# Patient Record
Sex: Female | Born: 1955 | Race: Black or African American | Hispanic: No | Smoking: Never smoker
Health system: Southern US, Community
[De-identification: ages and names within clinical notes are randomized; demographics above are authoritative.]

## PROBLEM LIST (undated history)

## (undated) DIAGNOSIS — E785 Hyperlipidemia, unspecified: Secondary | ICD-10-CM

## (undated) DIAGNOSIS — K254 Chronic or unspecified gastric ulcer with hemorrhage: Secondary | ICD-10-CM

## (undated) DIAGNOSIS — C220 Liver cell carcinoma: Secondary | ICD-10-CM

## (undated) DIAGNOSIS — I480 Paroxysmal atrial fibrillation: Secondary | ICD-10-CM

## (undated) DIAGNOSIS — K7030 Alcoholic cirrhosis of liver without ascites: Secondary | ICD-10-CM

## (undated) DIAGNOSIS — K921 Melena: Secondary | ICD-10-CM

## (undated) DIAGNOSIS — E119 Type 2 diabetes mellitus without complications: Secondary | ICD-10-CM

## (undated) DIAGNOSIS — I671 Cerebral aneurysm, nonruptured: Secondary | ICD-10-CM

## (undated) DIAGNOSIS — N189 Chronic kidney disease, unspecified: Secondary | ICD-10-CM

## (undated) DIAGNOSIS — I10 Essential (primary) hypertension: Secondary | ICD-10-CM

## (undated) DIAGNOSIS — G40909 Epilepsy, unspecified, not intractable, without status epilepticus: Secondary | ICD-10-CM

## (undated) HISTORY — DX: Chronic kidney disease, unspecified: N18.9

## (undated) HISTORY — PX: CEREBRAL ANEURYSM REPAIR: SHX164

## (undated) HISTORY — DX: Melena: K92.1

## (undated) HISTORY — DX: Hyperlipidemia, unspecified: E78.5

## (undated) HISTORY — DX: Essential (primary) hypertension: I10

## (undated) HISTORY — DX: Type 2 diabetes mellitus without complications: E11.9

## (undated) HISTORY — DX: Cerebral aneurysm, nonruptured: I67.1

## (undated) HISTORY — DX: Chronic or unspecified gastric ulcer with hemorrhage: K25.4

## (undated) HISTORY — DX: Epilepsy, unspecified, not intractable, without status epilepticus: G40.909

---

## 1989-04-19 HISTORY — PX: CEREBRAL ANEURYSM REPAIR: SHX164

## 2013-10-11 ENCOUNTER — Emergency Department: Payer: Self-pay | Admitting: Emergency Medicine

## 2013-10-11 DIAGNOSIS — W57XXXA Bitten or stung by nonvenomous insect and other nonvenomous arthropods, initial encounter: Secondary | ICD-10-CM | POA: Diagnosis not present

## 2013-10-11 DIAGNOSIS — E119 Type 2 diabetes mellitus without complications: Secondary | ICD-10-CM | POA: Diagnosis not present

## 2013-10-11 DIAGNOSIS — T148 Other injury of unspecified body region: Secondary | ICD-10-CM | POA: Diagnosis not present

## 2013-10-11 DIAGNOSIS — I10 Essential (primary) hypertension: Secondary | ICD-10-CM | POA: Diagnosis not present

## 2013-10-11 DIAGNOSIS — S30860A Insect bite (nonvenomous) of lower back and pelvis, initial encounter: Secondary | ICD-10-CM | POA: Diagnosis not present

## 2013-10-11 DIAGNOSIS — Z79899 Other long term (current) drug therapy: Secondary | ICD-10-CM | POA: Diagnosis not present

## 2013-10-11 LAB — CBC
HCT: 33.1 % — ABNORMAL LOW (ref 35.0–47.0)
HGB: 11.1 g/dL — ABNORMAL LOW (ref 12.0–16.0)
MCH: 33 pg (ref 26.0–34.0)
MCHC: 33.5 g/dL (ref 32.0–36.0)
MCV: 98 fL (ref 80–100)
Platelet: 100 10*3/uL — ABNORMAL LOW (ref 150–440)
RBC: 3.36 10*6/uL — AB (ref 3.80–5.20)
RDW: 13.7 % (ref 11.5–14.5)
WBC: 4.4 10*3/uL (ref 3.6–11.0)

## 2013-10-11 LAB — URINALYSIS, COMPLETE
Bilirubin,UR: NEGATIVE
Ketone: NEGATIVE
NITRITE: NEGATIVE
Ph: 6 (ref 4.5–8.0)
Protein: 100
RBC,UR: 17 /HPF (ref 0–5)
Specific Gravity: 1.012 (ref 1.003–1.030)
Squamous Epithelial: 1
WBC UR: 16 /HPF (ref 0–5)

## 2013-10-11 LAB — BASIC METABOLIC PANEL
Anion Gap: 8 (ref 7–16)
BUN: 13 mg/dL (ref 7–18)
CALCIUM: 8.4 mg/dL — AB (ref 8.5–10.1)
CHLORIDE: 106 mmol/L (ref 98–107)
CO2: 24 mmol/L (ref 21–32)
Creatinine: 0.87 mg/dL (ref 0.60–1.30)
EGFR (African American): >60
EGFR (Non-African Amer.): >60
GLUCOSE: 235 mg/dL — AB (ref 65–99)
Osmolality: 283 (ref 275–301)
POTASSIUM: 3.9 mmol/L (ref 3.5–5.1)
Sodium: 138 mmol/L (ref 136–145)

## 2014-01-08 DIAGNOSIS — E782 Mixed hyperlipidemia: Secondary | ICD-10-CM | POA: Diagnosis not present

## 2014-01-08 DIAGNOSIS — I10 Essential (primary) hypertension: Secondary | ICD-10-CM | POA: Diagnosis not present

## 2014-01-08 DIAGNOSIS — E119 Type 2 diabetes mellitus without complications: Secondary | ICD-10-CM | POA: Diagnosis not present

## 2014-01-23 DIAGNOSIS — H40003 Preglaucoma, unspecified, bilateral: Secondary | ICD-10-CM | POA: Diagnosis not present

## 2014-02-01 DIAGNOSIS — H40003 Preglaucoma, unspecified, bilateral: Secondary | ICD-10-CM | POA: Diagnosis not present

## 2014-04-22 DIAGNOSIS — B182 Chronic viral hepatitis C: Secondary | ICD-10-CM | POA: Diagnosis not present

## 2014-04-22 DIAGNOSIS — R945 Abnormal results of liver function studies: Secondary | ICD-10-CM | POA: Diagnosis not present

## 2014-05-02 DIAGNOSIS — N289 Disorder of kidney and ureter, unspecified: Secondary | ICD-10-CM | POA: Diagnosis not present

## 2014-05-15 ENCOUNTER — Ambulatory Visit: Payer: Self-pay | Admitting: Gastroenterology

## 2014-05-15 DIAGNOSIS — B182 Chronic viral hepatitis C: Secondary | ICD-10-CM | POA: Diagnosis not present

## 2014-05-15 DIAGNOSIS — K828 Other specified diseases of gallbladder: Secondary | ICD-10-CM | POA: Diagnosis not present

## 2014-05-15 IMAGING — US ABDOMEN ULTRASOUND LIMITED
1 series · 13 of 25 positions shown · non-contrast
Comparison: None

CLINICAL DATA: Hepatitis-C

EXAM:
US ABDOMEN LIMITED - RIGHT UPPER QUADRANT
ULTRASOUND HEPATIC ELASTOGRAPHY
TECHNIQUE: Limited right upper quadrant abdominal ultrasound was performed. In
addition, ultrasound elastography evaluation of the liver was
performed. A region of interest was placed in the right lobe of the
liver. Following application of a compressive sonographic pulse,
shear waves were detected in the adjacent hepatic tissue and the
shear wave velocity was calculated. Multiple assessments were
performed at the selected site. Median shear wave velocity is
correlated to a Metavir fibrosis score.

[Series 1: abdomen ultrasound limited · 0.22mm/px · 13 of 47 slices shown]
[im 1/47]
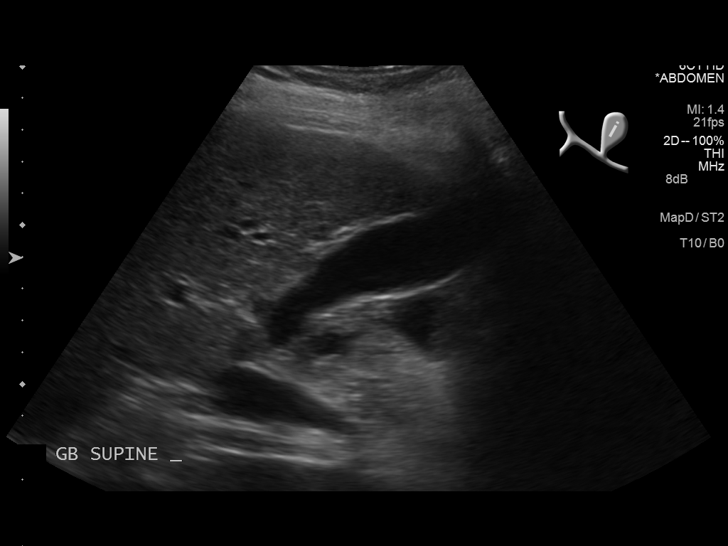
[im 4/47]
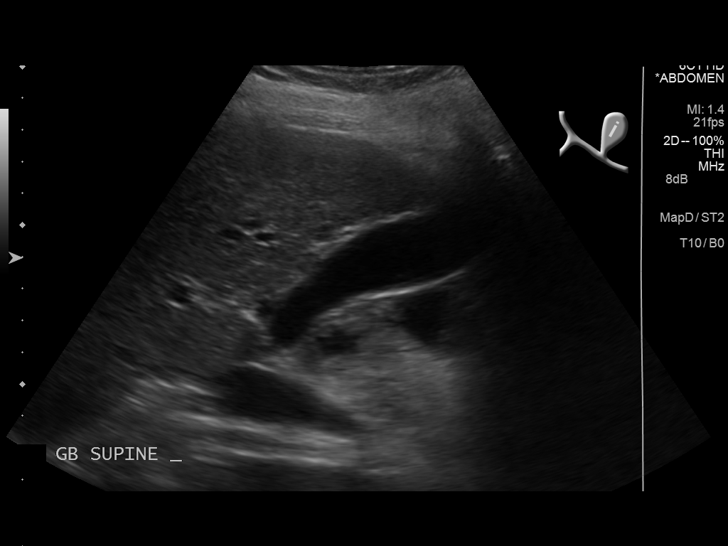
[im 8/47]
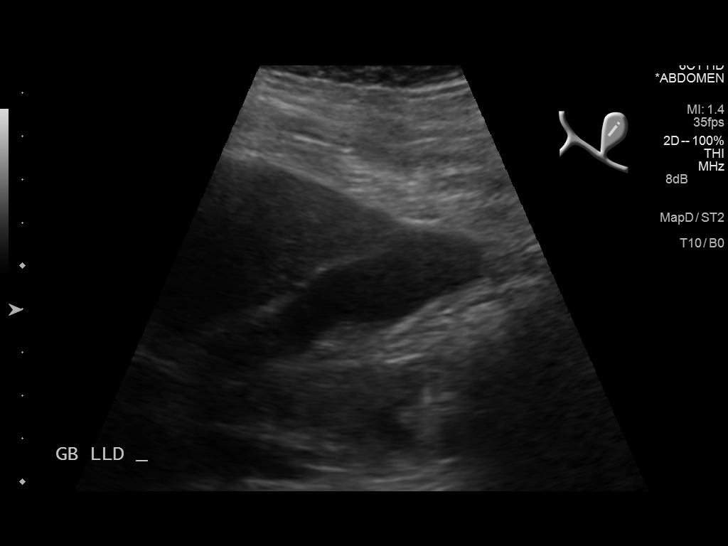
[im 12/47]
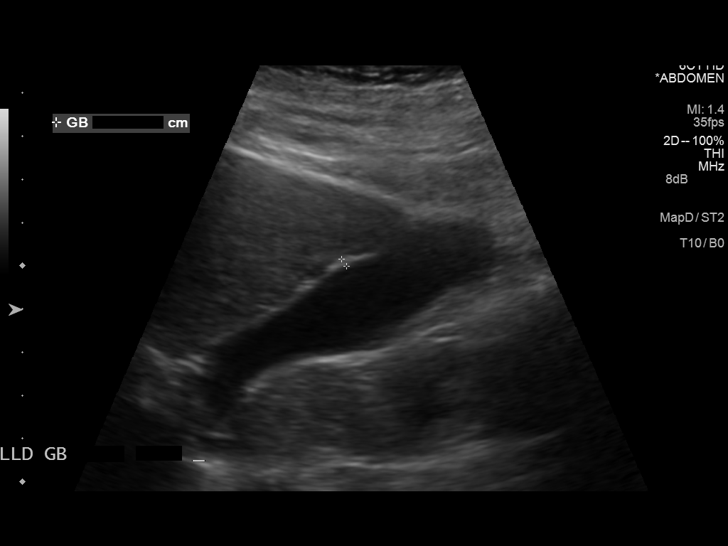
[im 16/47]
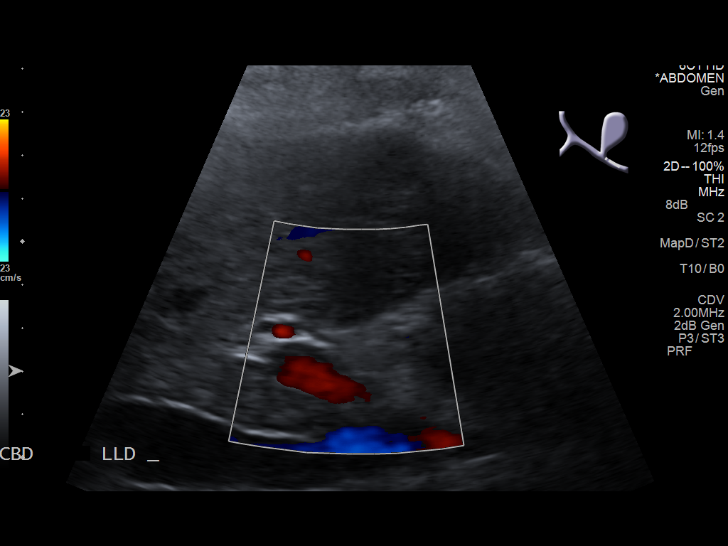
[im 20/47]
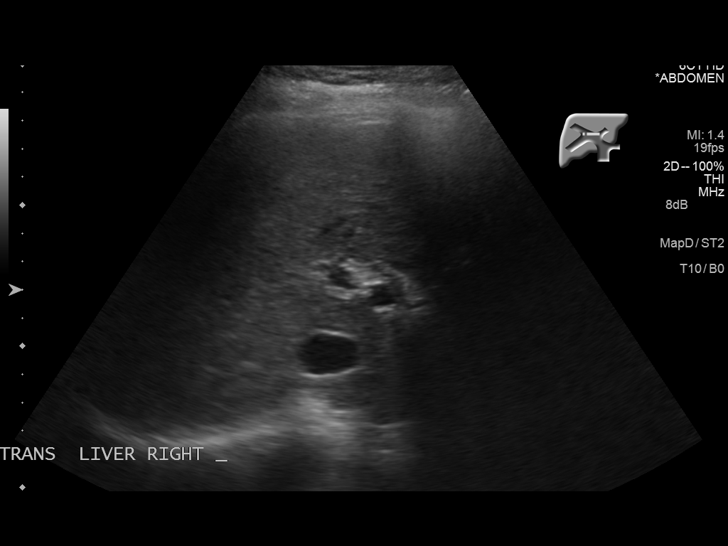
[im 24/47]
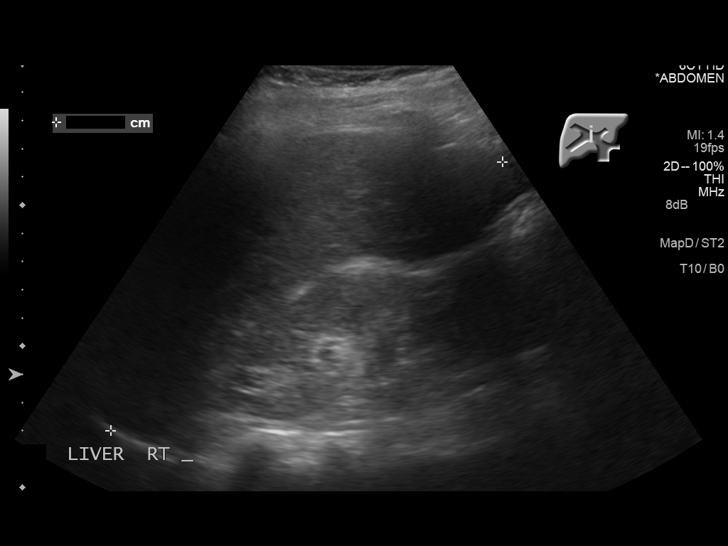
[im 27/47]
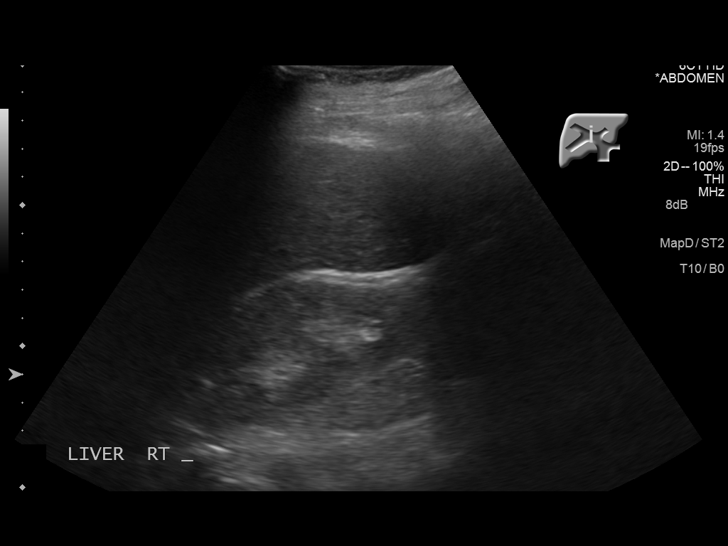
[im 31/47]
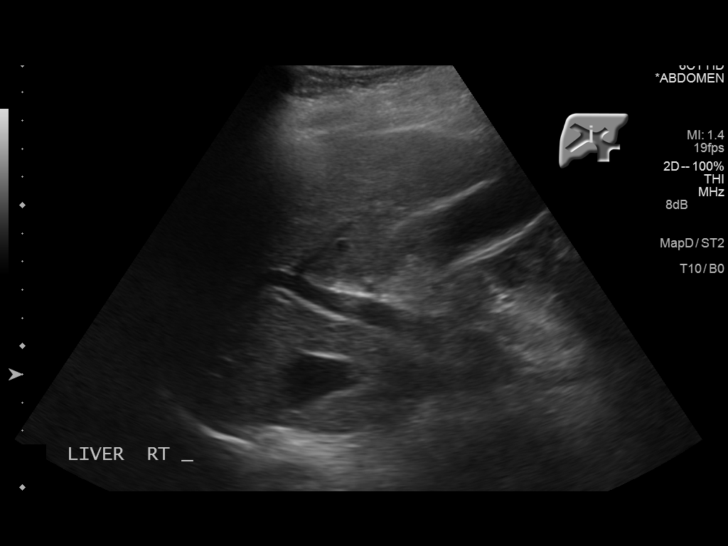
[im 35/47]
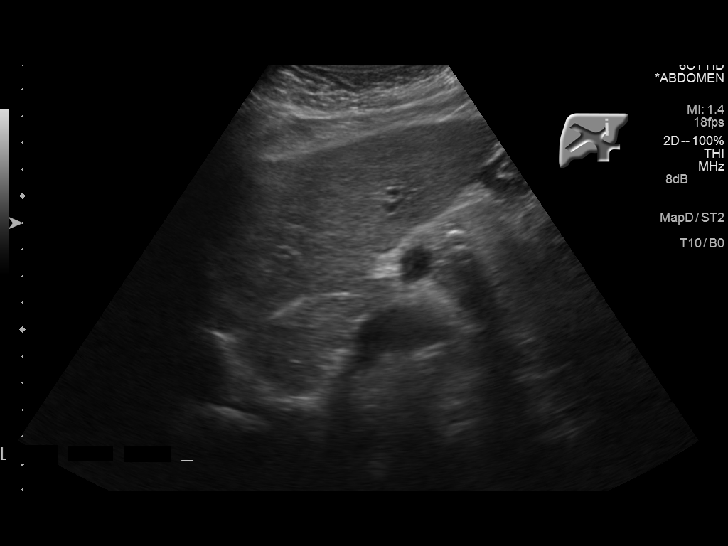
[im 39/47]
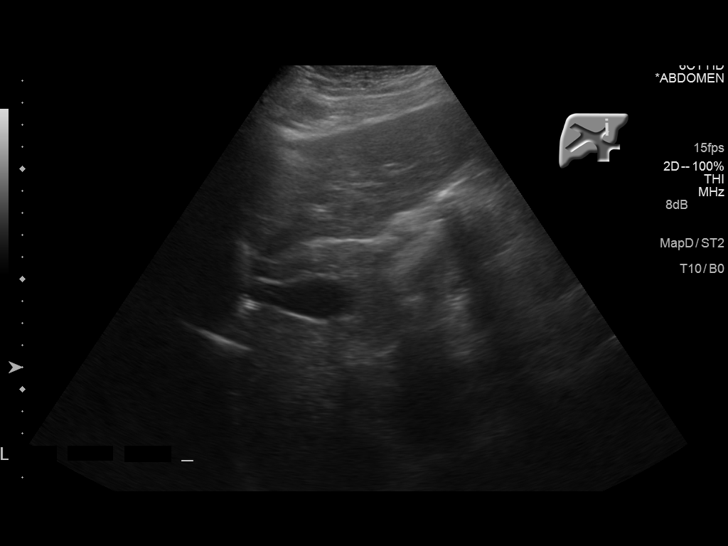
[im 43/47]
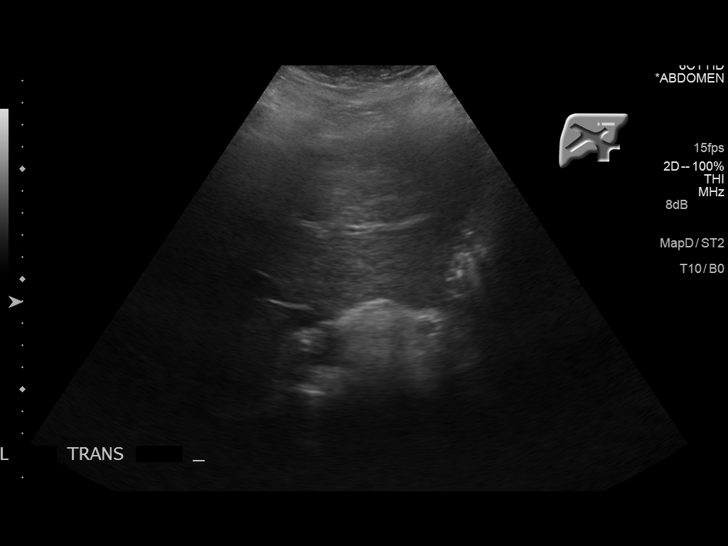
[im 47/47]
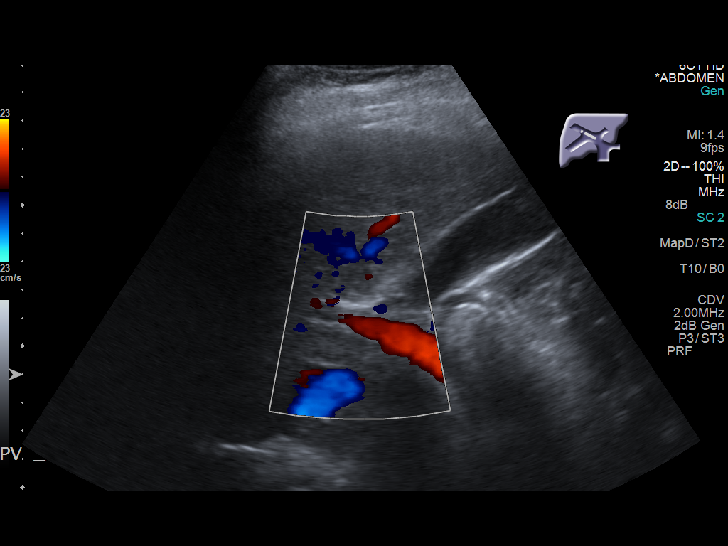

[13 of 25 positions shown; findings below may reference images not displayed]

FINDINGS: ULTRASOUND ABDOMEN LIMITED RIGHT UPPER QUADRANT

Gallbladder: Echogenic sludge within gallbladder. No discrete
shadowing calculi, gallbladder wall thickening or pericholecystic
fluid. No sonographic Murphy sign.

Common bile duct: Diameter: 3 mm diameter common normal

Liver: Upper normal echogenicity. Normal hepatic morphology. No mass
or nodularity. Hepatopetal portal venous flow.

ULTRASOUND HEPATIC ELASTOGRAPHY

Device: Siemens Helix VQ

Transducer: [id number]

Patient position: Supine

Number of measurements:  10

Hepatic Segment:  8

Median velocity:   3.27  m/sec

IQR:

IQR/Median velocity ratio:

Corresponding Metavir fibrosis score:  F4 & some F3

Risk of fibrosis: High

Limitations of exam: None

Pertinent findings noted on other imaging exams: No focal
abnormalities of the liver on ultrasound.

Please note that abnormal shear wave velocities may also be
identified in clinical settings other than with hepatic fibrosis,
such as: acute hepatitis, elevated right heart and central venous
pressures including use of beta blockers, LAINEZ disease
(LAINEZ), infiltrative processes such as
mastocytosis/amyloidosis/infiltrative tumor, extrahepatic
cholestasis, in the post-prandial state, and liver transplantation.
Correlation with patient history, laboratory data, and clinical
condition recommended.
IMPRESSION: Sludge within gallbladder without evidence of discrete shadowing
calculi or biliary dilatation.

No focal abnormalities of the liver identified by sonography.

Median hepatic shear wave velocity is calculated at 3.27 m/sec.
Corresponding Metavir fibrosis score is F4 & some F3.
Risk of fibrosis is high.

Follow-up:  Advised

## 2014-05-15 IMAGING — US ABDOMEN ULTRASOUND LIMITED
1 series · 13 of 25 positions shown · non-contrast
Comparison: None

CLINICAL DATA: Hepatitis-C

EXAM:
US ABDOMEN LIMITED - RIGHT UPPER QUADRANT
ULTRASOUND HEPATIC ELASTOGRAPHY
TECHNIQUE: Limited right upper quadrant abdominal ultrasound was performed. In
addition, ultrasound elastography evaluation of the liver was
performed. A region of interest was placed in the right lobe of the
liver. Following application of a compressive sonographic pulse,
shear waves were detected in the adjacent hepatic tissue and the
shear wave velocity was calculated. Multiple assessments were
performed at the selected site. Median shear wave velocity is
correlated to a Metavir fibrosis score.

[Series 1: abdomen ultrasound limited · 0.22mm/px · 13 of 37 slices shown]
[im 1/37]
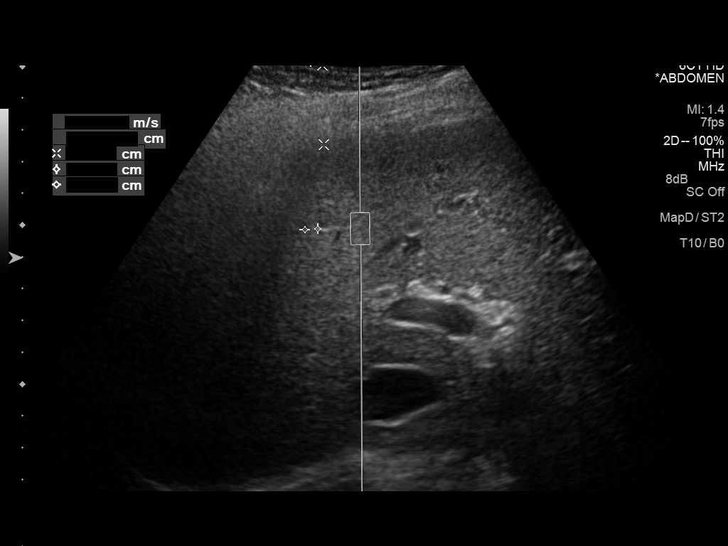
[im 4/37]
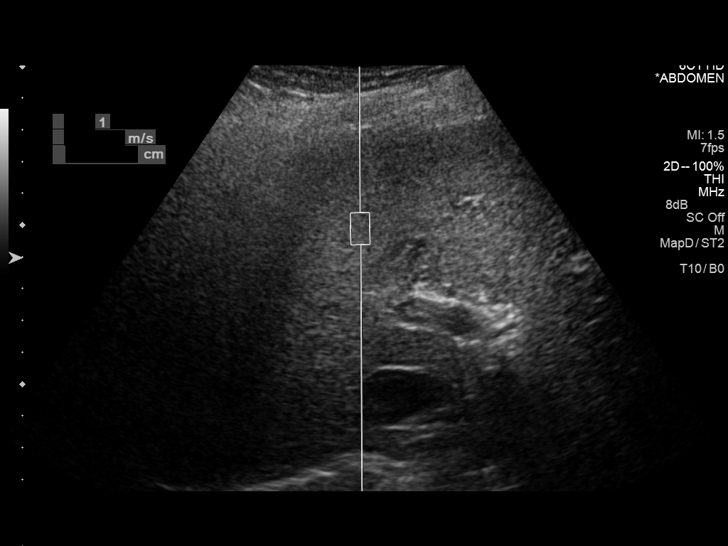
[im 7/37]
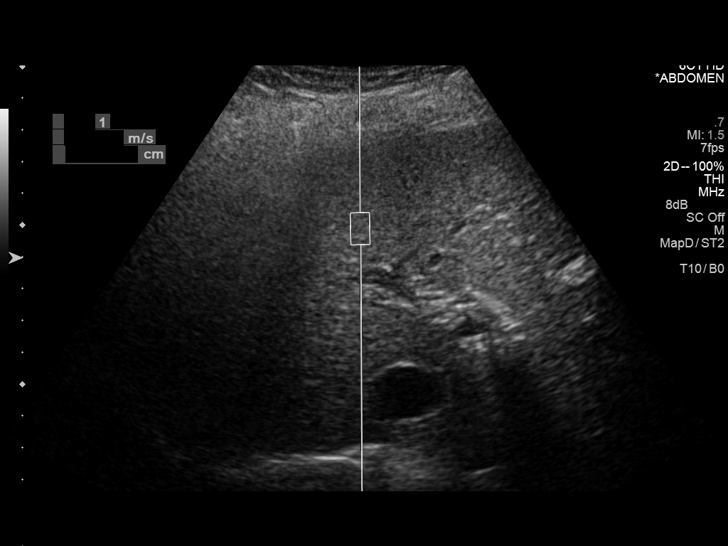
[im 10/37]
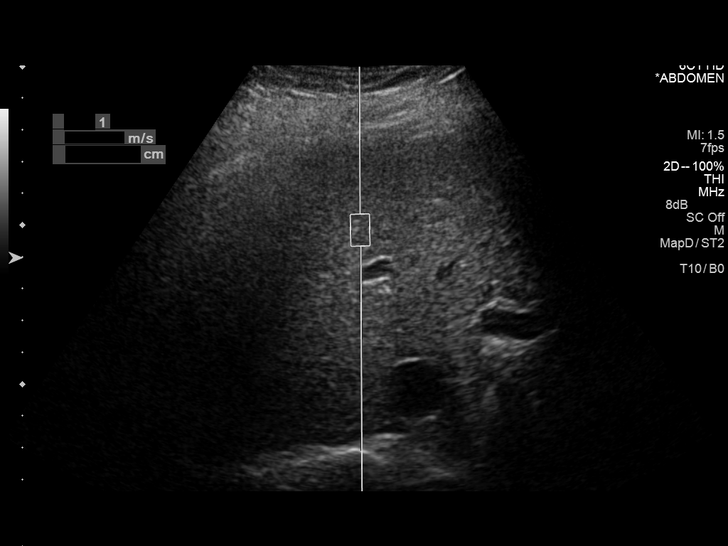
[im 13/37]
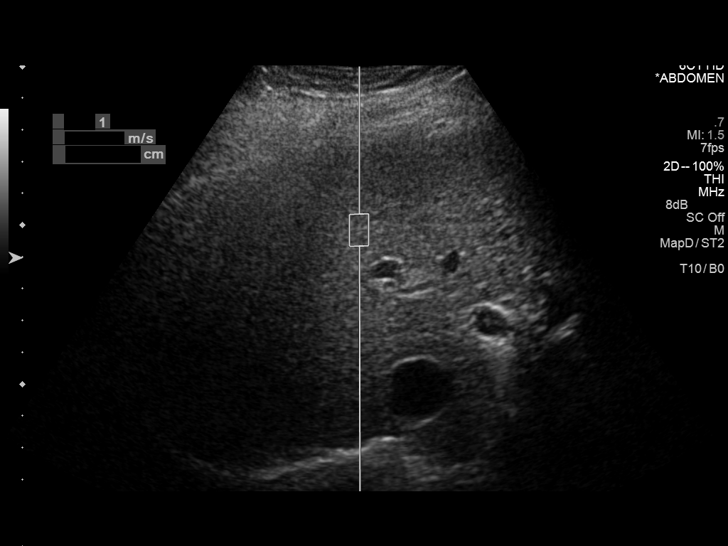
[im 16/37]
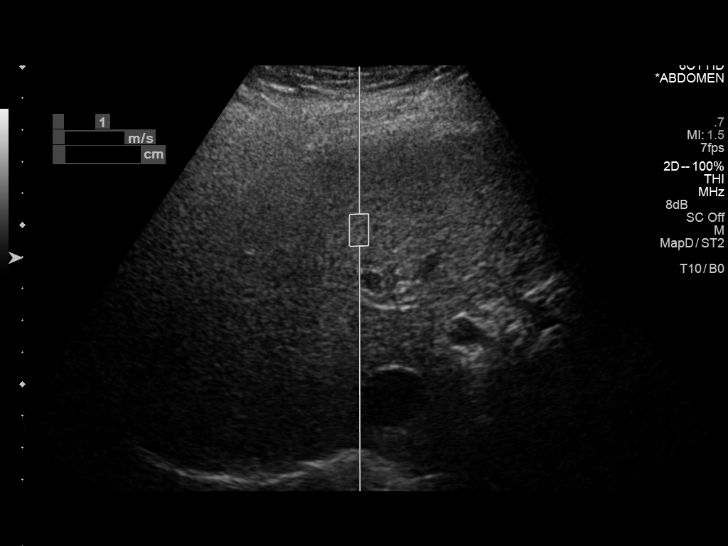
[im 19/37]
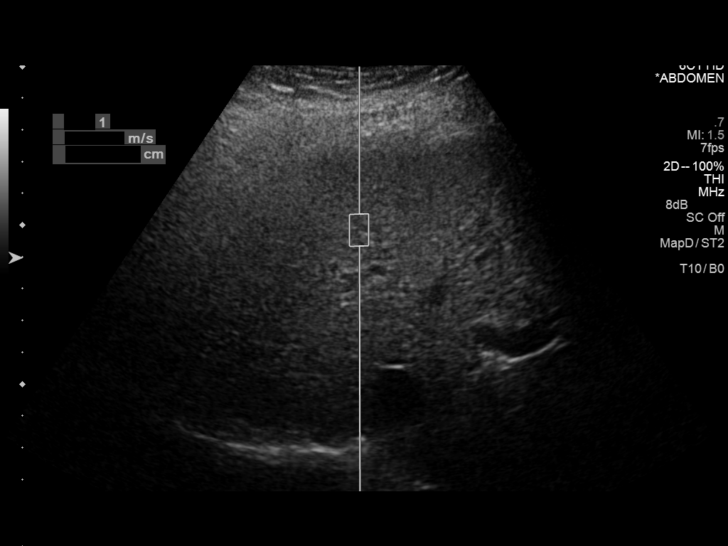
[im 22/37]
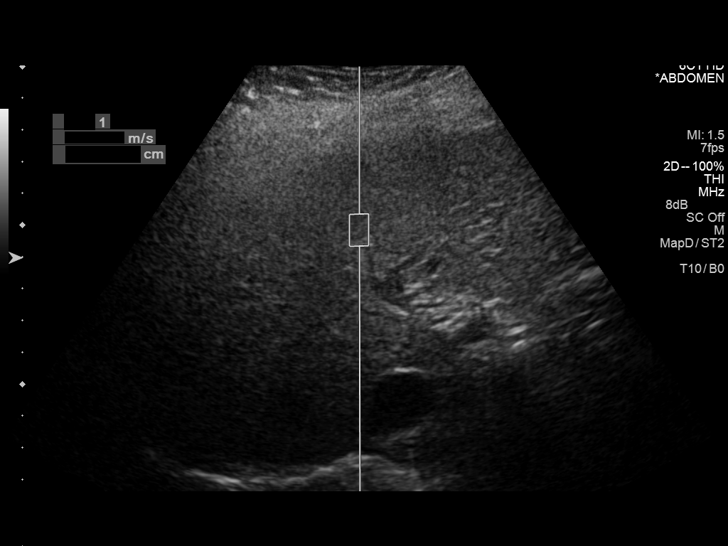
[im 25/37]
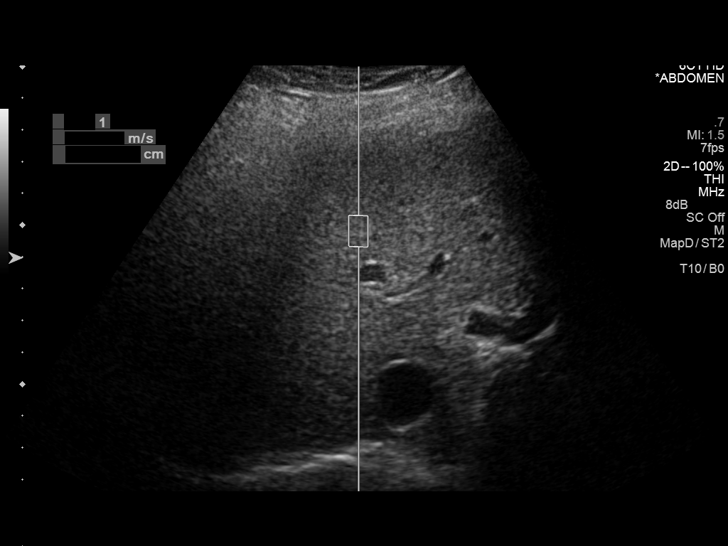
[im 28/37]
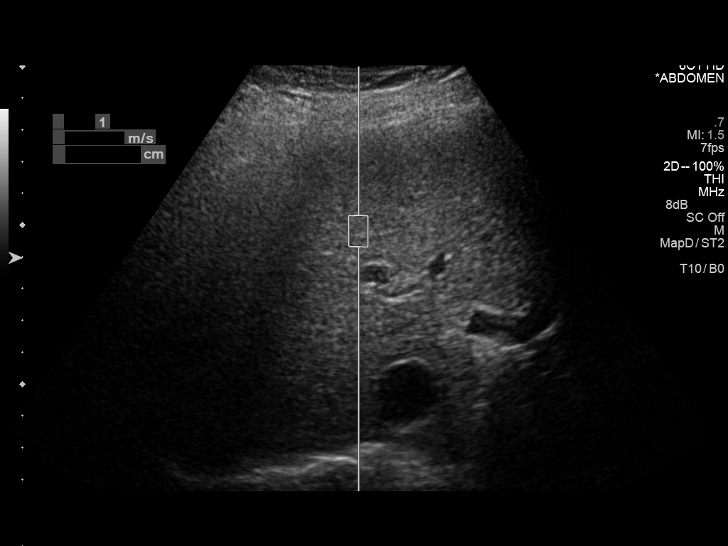
[im 31/37]
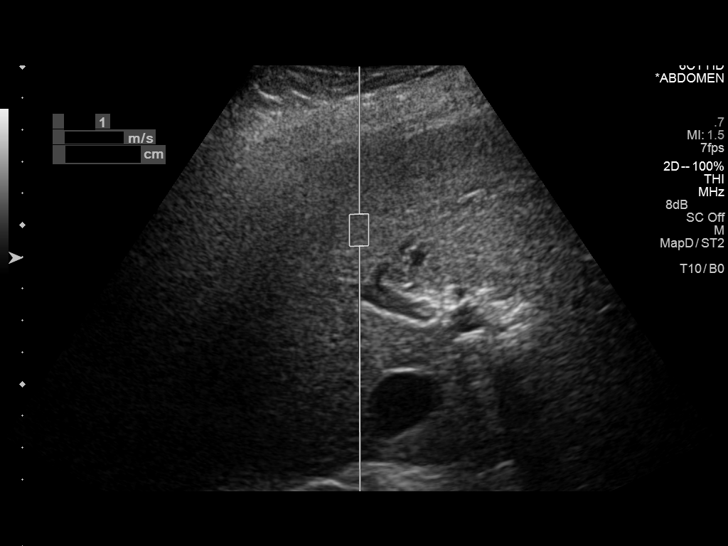
[im 34/37]
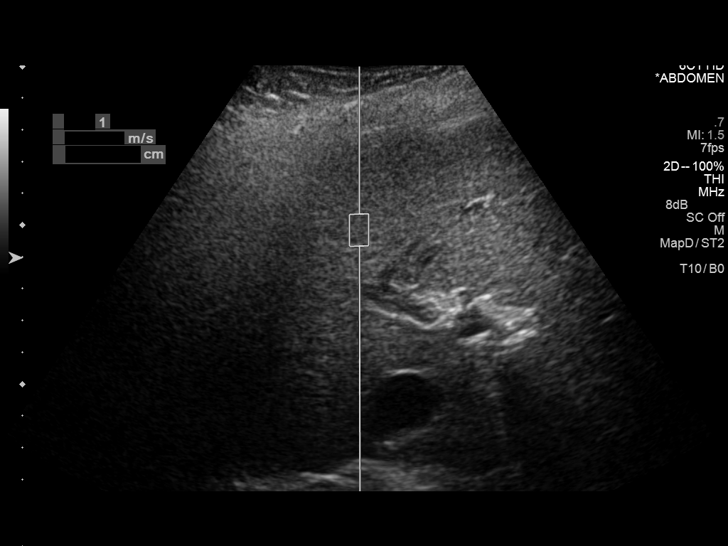
[im 37/37]
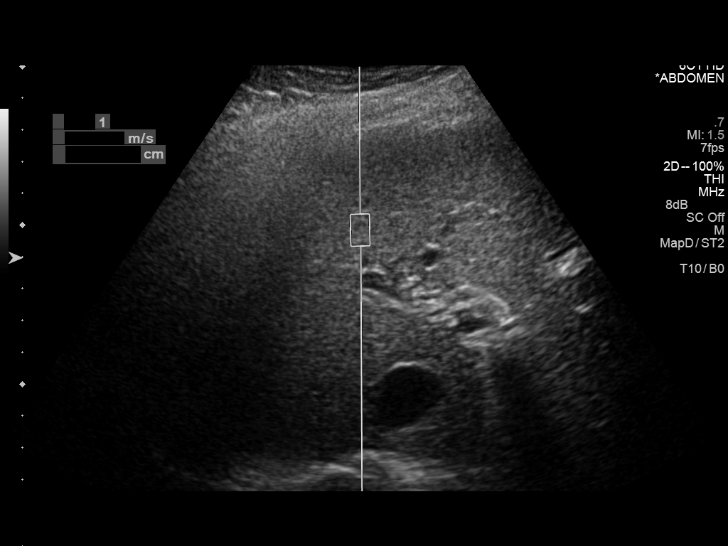

[13 of 25 positions shown; findings below may reference images not displayed]

FINDINGS: ULTRASOUND ABDOMEN LIMITED RIGHT UPPER QUADRANT

Gallbladder: Echogenic sludge within gallbladder. No discrete
shadowing calculi, gallbladder wall thickening or pericholecystic
fluid. No sonographic Murphy sign.

Common bile duct: Diameter: 3 mm diameter common normal

Liver: Upper normal echogenicity. Normal hepatic morphology. No mass
or nodularity. Hepatopetal portal venous flow.

ULTRASOUND HEPATIC ELASTOGRAPHY

Device: Siemens Helix VQ

Transducer: [id number]

Patient position: Supine

Number of measurements:  10

Hepatic Segment:  8

Median velocity:   3.27  m/sec

IQR:

IQR/Median velocity ratio:

Corresponding Metavir fibrosis score:  F4 & some F3

Risk of fibrosis: High

Limitations of exam: None

Pertinent findings noted on other imaging exams: No focal
abnormalities of the liver on ultrasound.

Please note that abnormal shear wave velocities may also be
identified in clinical settings other than with hepatic fibrosis,
such as: acute hepatitis, elevated right heart and central venous
pressures including use of beta blockers, LAINEZ disease
(LAINEZ), infiltrative processes such as
mastocytosis/amyloidosis/infiltrative tumor, extrahepatic
cholestasis, in the post-prandial state, and liver transplantation.
Correlation with patient history, laboratory data, and clinical
condition recommended.
IMPRESSION: Sludge within gallbladder without evidence of discrete shadowing
calculi or biliary dilatation.

No focal abnormalities of the liver identified by sonography.

Median hepatic shear wave velocity is calculated at 3.27 m/sec.
Corresponding Metavir fibrosis score is F4 & some F3.
Risk of fibrosis is high.

Follow-up:  Advised

## 2014-05-21 DIAGNOSIS — E119 Type 2 diabetes mellitus without complications: Secondary | ICD-10-CM | POA: Diagnosis not present

## 2014-05-22 DIAGNOSIS — B182 Chronic viral hepatitis C: Secondary | ICD-10-CM | POA: Diagnosis not present

## 2014-06-25 ENCOUNTER — Ambulatory Visit: Payer: Self-pay | Admitting: Gastroenterology

## 2014-06-25 DIAGNOSIS — D125 Benign neoplasm of sigmoid colon: Secondary | ICD-10-CM | POA: Diagnosis not present

## 2014-06-25 DIAGNOSIS — K648 Other hemorrhoids: Secondary | ICD-10-CM | POA: Diagnosis not present

## 2014-06-25 DIAGNOSIS — I10 Essential (primary) hypertension: Secondary | ICD-10-CM | POA: Diagnosis not present

## 2014-06-25 DIAGNOSIS — E119 Type 2 diabetes mellitus without complications: Secondary | ICD-10-CM | POA: Diagnosis not present

## 2014-06-25 DIAGNOSIS — Z888 Allergy status to other drugs, medicaments and biological substances status: Secondary | ICD-10-CM | POA: Diagnosis not present

## 2014-06-25 DIAGNOSIS — D124 Benign neoplasm of descending colon: Secondary | ICD-10-CM | POA: Diagnosis not present

## 2014-06-25 DIAGNOSIS — D122 Benign neoplasm of ascending colon: Secondary | ICD-10-CM | POA: Diagnosis not present

## 2014-06-25 DIAGNOSIS — B1920 Unspecified viral hepatitis C without hepatic coma: Secondary | ICD-10-CM | POA: Diagnosis not present

## 2014-06-25 DIAGNOSIS — D120 Benign neoplasm of cecum: Secondary | ICD-10-CM | POA: Diagnosis not present

## 2014-06-25 DIAGNOSIS — Z1211 Encounter for screening for malignant neoplasm of colon: Secondary | ICD-10-CM | POA: Diagnosis not present

## 2014-07-10 DIAGNOSIS — Z1231 Encounter for screening mammogram for malignant neoplasm of breast: Secondary | ICD-10-CM | POA: Diagnosis not present

## 2014-07-15 DIAGNOSIS — Z79899 Other long term (current) drug therapy: Secondary | ICD-10-CM | POA: Diagnosis not present

## 2014-07-22 DIAGNOSIS — F1920 Other psychoactive substance dependence, uncomplicated: Secondary | ICD-10-CM | POA: Diagnosis not present

## 2014-07-22 DIAGNOSIS — Z79899 Other long term (current) drug therapy: Secondary | ICD-10-CM | POA: Diagnosis not present

## 2014-07-25 DIAGNOSIS — Z79899 Other long term (current) drug therapy: Secondary | ICD-10-CM | POA: Diagnosis not present

## 2014-07-26 DIAGNOSIS — Z79899 Other long term (current) drug therapy: Secondary | ICD-10-CM | POA: Diagnosis not present

## 2014-07-26 DIAGNOSIS — F1920 Other psychoactive substance dependence, uncomplicated: Secondary | ICD-10-CM | POA: Diagnosis not present

## 2014-07-31 DIAGNOSIS — F1920 Other psychoactive substance dependence, uncomplicated: Secondary | ICD-10-CM | POA: Diagnosis not present

## 2014-07-31 DIAGNOSIS — Z79899 Other long term (current) drug therapy: Secondary | ICD-10-CM | POA: Diagnosis not present

## 2014-08-05 DIAGNOSIS — F1920 Other psychoactive substance dependence, uncomplicated: Secondary | ICD-10-CM | POA: Diagnosis not present

## 2014-08-05 DIAGNOSIS — Z79899 Other long term (current) drug therapy: Secondary | ICD-10-CM | POA: Diagnosis not present

## 2014-08-07 DIAGNOSIS — Z79899 Other long term (current) drug therapy: Secondary | ICD-10-CM | POA: Diagnosis not present

## 2014-08-07 DIAGNOSIS — F1920 Other psychoactive substance dependence, uncomplicated: Secondary | ICD-10-CM | POA: Diagnosis not present

## 2014-08-08 DIAGNOSIS — E782 Mixed hyperlipidemia: Secondary | ICD-10-CM | POA: Diagnosis not present

## 2014-08-08 DIAGNOSIS — E1142 Type 2 diabetes mellitus with diabetic polyneuropathy: Secondary | ICD-10-CM | POA: Diagnosis not present

## 2014-08-08 DIAGNOSIS — I10 Essential (primary) hypertension: Secondary | ICD-10-CM | POA: Diagnosis not present

## 2014-08-08 DIAGNOSIS — F10188 Alcohol abuse with other alcohol-induced disorder: Secondary | ICD-10-CM | POA: Diagnosis not present

## 2014-08-08 DIAGNOSIS — D696 Thrombocytopenia, unspecified: Secondary | ICD-10-CM | POA: Diagnosis not present

## 2014-08-09 DIAGNOSIS — Z79899 Other long term (current) drug therapy: Secondary | ICD-10-CM | POA: Diagnosis not present

## 2014-08-09 DIAGNOSIS — F1920 Other psychoactive substance dependence, uncomplicated: Secondary | ICD-10-CM | POA: Diagnosis not present

## 2014-08-12 DIAGNOSIS — Z79899 Other long term (current) drug therapy: Secondary | ICD-10-CM | POA: Diagnosis not present

## 2014-08-12 DIAGNOSIS — F1920 Other psychoactive substance dependence, uncomplicated: Secondary | ICD-10-CM | POA: Diagnosis not present

## 2014-08-12 LAB — SURGICAL PATHOLOGY

## 2014-08-14 DIAGNOSIS — F1920 Other psychoactive substance dependence, uncomplicated: Secondary | ICD-10-CM | POA: Diagnosis not present

## 2014-08-14 DIAGNOSIS — Z79899 Other long term (current) drug therapy: Secondary | ICD-10-CM | POA: Diagnosis not present

## 2014-08-16 DIAGNOSIS — F1920 Other psychoactive substance dependence, uncomplicated: Secondary | ICD-10-CM | POA: Diagnosis not present

## 2014-08-16 DIAGNOSIS — Z79899 Other long term (current) drug therapy: Secondary | ICD-10-CM | POA: Diagnosis not present

## 2014-08-19 DIAGNOSIS — Z79899 Other long term (current) drug therapy: Secondary | ICD-10-CM | POA: Diagnosis not present

## 2014-08-19 DIAGNOSIS — F1920 Other psychoactive substance dependence, uncomplicated: Secondary | ICD-10-CM | POA: Diagnosis not present

## 2014-08-21 DIAGNOSIS — Z79899 Other long term (current) drug therapy: Secondary | ICD-10-CM | POA: Diagnosis not present

## 2014-08-21 DIAGNOSIS — F1920 Other psychoactive substance dependence, uncomplicated: Secondary | ICD-10-CM | POA: Diagnosis not present

## 2014-08-23 DIAGNOSIS — Z79899 Other long term (current) drug therapy: Secondary | ICD-10-CM | POA: Diagnosis not present

## 2014-08-23 DIAGNOSIS — F1920 Other psychoactive substance dependence, uncomplicated: Secondary | ICD-10-CM | POA: Diagnosis not present

## 2014-08-26 DIAGNOSIS — F1920 Other psychoactive substance dependence, uncomplicated: Secondary | ICD-10-CM | POA: Diagnosis not present

## 2014-08-26 DIAGNOSIS — Z79899 Other long term (current) drug therapy: Secondary | ICD-10-CM | POA: Diagnosis not present

## 2014-08-28 DIAGNOSIS — F1920 Other psychoactive substance dependence, uncomplicated: Secondary | ICD-10-CM | POA: Diagnosis not present

## 2014-08-28 DIAGNOSIS — Z79899 Other long term (current) drug therapy: Secondary | ICD-10-CM | POA: Diagnosis not present

## 2014-09-02 DIAGNOSIS — Z79899 Other long term (current) drug therapy: Secondary | ICD-10-CM | POA: Diagnosis not present

## 2014-09-02 DIAGNOSIS — F1920 Other psychoactive substance dependence, uncomplicated: Secondary | ICD-10-CM | POA: Diagnosis not present

## 2015-01-06 DIAGNOSIS — Z0000 Encounter for general adult medical examination without abnormal findings: Secondary | ICD-10-CM | POA: Diagnosis not present

## 2015-01-06 DIAGNOSIS — H42 Glaucoma in diseases classified elsewhere: Secondary | ICD-10-CM | POA: Diagnosis not present

## 2015-01-06 DIAGNOSIS — I10 Essential (primary) hypertension: Secondary | ICD-10-CM | POA: Diagnosis not present

## 2015-01-06 DIAGNOSIS — D696 Thrombocytopenia, unspecified: Secondary | ICD-10-CM | POA: Diagnosis not present

## 2015-01-06 DIAGNOSIS — E1165 Type 2 diabetes mellitus with hyperglycemia: Secondary | ICD-10-CM | POA: Diagnosis not present

## 2015-01-06 DIAGNOSIS — B1920 Unspecified viral hepatitis C without hepatic coma: Secondary | ICD-10-CM | POA: Diagnosis not present

## 2015-01-06 DIAGNOSIS — E782 Mixed hyperlipidemia: Secondary | ICD-10-CM | POA: Diagnosis not present

## 2015-01-06 DIAGNOSIS — F10188 Alcohol abuse with other alcohol-induced disorder: Secondary | ICD-10-CM | POA: Diagnosis not present

## 2015-01-06 DIAGNOSIS — Z0001 Encounter for general adult medical examination with abnormal findings: Secondary | ICD-10-CM | POA: Diagnosis not present

## 2015-01-06 DIAGNOSIS — E1142 Type 2 diabetes mellitus with diabetic polyneuropathy: Secondary | ICD-10-CM | POA: Diagnosis not present

## 2015-01-22 DIAGNOSIS — R944 Abnormal results of kidney function studies: Secondary | ICD-10-CM | POA: Diagnosis not present

## 2015-01-29 DIAGNOSIS — H40003 Preglaucoma, unspecified, bilateral: Secondary | ICD-10-CM | POA: Diagnosis not present

## 2015-02-06 DIAGNOSIS — D696 Thrombocytopenia, unspecified: Secondary | ICD-10-CM | POA: Diagnosis not present

## 2015-02-06 DIAGNOSIS — R411 Anterograde amnesia: Secondary | ICD-10-CM | POA: Diagnosis not present

## 2015-02-06 DIAGNOSIS — F10188 Alcohol abuse with other alcohol-induced disorder: Secondary | ICD-10-CM | POA: Diagnosis not present

## 2015-02-06 DIAGNOSIS — E1122 Type 2 diabetes mellitus with diabetic chronic kidney disease: Secondary | ICD-10-CM | POA: Diagnosis not present

## 2015-02-06 DIAGNOSIS — D519 Vitamin B12 deficiency anemia, unspecified: Secondary | ICD-10-CM | POA: Diagnosis not present

## 2015-02-11 DIAGNOSIS — I671 Cerebral aneurysm, nonruptured: Secondary | ICD-10-CM | POA: Diagnosis not present

## 2015-02-11 DIAGNOSIS — R413 Other amnesia: Secondary | ICD-10-CM | POA: Diagnosis not present

## 2015-02-11 DIAGNOSIS — R569 Unspecified convulsions: Secondary | ICD-10-CM | POA: Diagnosis not present

## 2015-02-11 DIAGNOSIS — I672 Cerebral atherosclerosis: Secondary | ICD-10-CM | POA: Diagnosis not present

## 2015-02-11 DIAGNOSIS — Z8679 Personal history of other diseases of the circulatory system: Secondary | ICD-10-CM | POA: Diagnosis not present

## 2015-02-21 DIAGNOSIS — E119 Type 2 diabetes mellitus without complications: Secondary | ICD-10-CM | POA: Diagnosis not present

## 2015-04-01 DIAGNOSIS — F1020 Alcohol dependence, uncomplicated: Secondary | ICD-10-CM | POA: Diagnosis not present

## 2015-05-19 DIAGNOSIS — I671 Cerebral aneurysm, nonruptured: Secondary | ICD-10-CM | POA: Diagnosis not present

## 2015-05-19 DIAGNOSIS — Z87898 Personal history of other specified conditions: Secondary | ICD-10-CM | POA: Diagnosis not present

## 2015-05-20 DIAGNOSIS — I671 Cerebral aneurysm, nonruptured: Secondary | ICD-10-CM | POA: Insufficient documentation

## 2015-05-20 DIAGNOSIS — Z87898 Personal history of other specified conditions: Secondary | ICD-10-CM | POA: Insufficient documentation

## 2015-05-21 DIAGNOSIS — E1122 Type 2 diabetes mellitus with diabetic chronic kidney disease: Secondary | ICD-10-CM | POA: Diagnosis not present

## 2015-05-21 DIAGNOSIS — D519 Vitamin B12 deficiency anemia, unspecified: Secondary | ICD-10-CM | POA: Diagnosis not present

## 2015-05-21 DIAGNOSIS — R411 Anterograde amnesia: Secondary | ICD-10-CM | POA: Diagnosis not present

## 2015-05-21 DIAGNOSIS — N182 Chronic kidney disease, stage 2 (mild): Secondary | ICD-10-CM | POA: Diagnosis not present

## 2015-06-02 DIAGNOSIS — I10 Essential (primary) hypertension: Secondary | ICD-10-CM | POA: Diagnosis not present

## 2015-06-02 DIAGNOSIS — D519 Vitamin B12 deficiency anemia, unspecified: Secondary | ICD-10-CM | POA: Diagnosis not present

## 2015-06-02 DIAGNOSIS — E1142 Type 2 diabetes mellitus with diabetic polyneuropathy: Secondary | ICD-10-CM | POA: Diagnosis not present

## 2015-06-02 DIAGNOSIS — J00 Acute nasopharyngitis [common cold]: Secondary | ICD-10-CM | POA: Diagnosis not present

## 2015-06-05 DIAGNOSIS — Z87898 Personal history of other specified conditions: Secondary | ICD-10-CM | POA: Diagnosis not present

## 2015-06-05 DIAGNOSIS — I671 Cerebral aneurysm, nonruptured: Secondary | ICD-10-CM | POA: Diagnosis not present

## 2015-06-17 DIAGNOSIS — Z87898 Personal history of other specified conditions: Secondary | ICD-10-CM | POA: Diagnosis not present

## 2015-06-17 DIAGNOSIS — I671 Cerebral aneurysm, nonruptured: Secondary | ICD-10-CM | POA: Diagnosis not present

## 2015-08-29 DIAGNOSIS — I10 Essential (primary) hypertension: Secondary | ICD-10-CM | POA: Diagnosis not present

## 2015-08-29 DIAGNOSIS — E1142 Type 2 diabetes mellitus with diabetic polyneuropathy: Secondary | ICD-10-CM | POA: Diagnosis not present

## 2015-08-29 DIAGNOSIS — R411 Anterograde amnesia: Secondary | ICD-10-CM | POA: Diagnosis not present

## 2015-08-29 DIAGNOSIS — D519 Vitamin B12 deficiency anemia, unspecified: Secondary | ICD-10-CM | POA: Diagnosis not present

## 2015-10-03 DIAGNOSIS — D519 Vitamin B12 deficiency anemia, unspecified: Secondary | ICD-10-CM | POA: Diagnosis not present

## 2015-12-03 DIAGNOSIS — H40003 Preglaucoma, unspecified, bilateral: Secondary | ICD-10-CM | POA: Diagnosis not present

## 2015-12-11 DIAGNOSIS — E1165 Type 2 diabetes mellitus with hyperglycemia: Secondary | ICD-10-CM | POA: Diagnosis not present

## 2015-12-11 DIAGNOSIS — I10 Essential (primary) hypertension: Secondary | ICD-10-CM | POA: Diagnosis not present

## 2015-12-11 DIAGNOSIS — H42 Glaucoma in diseases classified elsewhere: Secondary | ICD-10-CM | POA: Diagnosis not present

## 2015-12-11 DIAGNOSIS — D519 Vitamin B12 deficiency anemia, unspecified: Secondary | ICD-10-CM | POA: Diagnosis not present

## 2015-12-11 DIAGNOSIS — Z0001 Encounter for general adult medical examination with abnormal findings: Secondary | ICD-10-CM | POA: Diagnosis not present

## 2015-12-11 DIAGNOSIS — N390 Urinary tract infection, site not specified: Secondary | ICD-10-CM | POA: Diagnosis not present

## 2015-12-11 DIAGNOSIS — Z124 Encounter for screening for malignant neoplasm of cervix: Secondary | ICD-10-CM | POA: Diagnosis not present

## 2015-12-26 DIAGNOSIS — E2839 Other primary ovarian failure: Secondary | ICD-10-CM | POA: Diagnosis not present

## 2015-12-26 DIAGNOSIS — Z1231 Encounter for screening mammogram for malignant neoplasm of breast: Secondary | ICD-10-CM | POA: Diagnosis not present

## 2015-12-26 DIAGNOSIS — M810 Age-related osteoporosis without current pathological fracture: Secondary | ICD-10-CM | POA: Diagnosis not present

## 2016-04-15 DIAGNOSIS — I10 Essential (primary) hypertension: Secondary | ICD-10-CM | POA: Diagnosis not present

## 2016-04-15 DIAGNOSIS — E1165 Type 2 diabetes mellitus with hyperglycemia: Secondary | ICD-10-CM | POA: Diagnosis not present

## 2016-04-15 DIAGNOSIS — D519 Vitamin B12 deficiency anemia, unspecified: Secondary | ICD-10-CM | POA: Diagnosis not present

## 2016-05-31 DIAGNOSIS — H40003 Preglaucoma, unspecified, bilateral: Secondary | ICD-10-CM | POA: Diagnosis not present

## 2016-06-09 DIAGNOSIS — H40003 Preglaucoma, unspecified, bilateral: Secondary | ICD-10-CM | POA: Diagnosis not present

## 2016-07-29 DIAGNOSIS — I10 Essential (primary) hypertension: Secondary | ICD-10-CM | POA: Diagnosis not present

## 2016-07-29 DIAGNOSIS — E1165 Type 2 diabetes mellitus with hyperglycemia: Secondary | ICD-10-CM | POA: Diagnosis not present

## 2016-07-29 DIAGNOSIS — D519 Vitamin B12 deficiency anemia, unspecified: Secondary | ICD-10-CM | POA: Diagnosis not present

## 2016-08-27 DIAGNOSIS — D519 Vitamin B12 deficiency anemia, unspecified: Secondary | ICD-10-CM | POA: Diagnosis not present

## 2016-11-03 DIAGNOSIS — E1142 Type 2 diabetes mellitus with diabetic polyneuropathy: Secondary | ICD-10-CM | POA: Diagnosis not present

## 2016-11-03 DIAGNOSIS — D519 Vitamin B12 deficiency anemia, unspecified: Secondary | ICD-10-CM | POA: Diagnosis not present

## 2016-11-03 DIAGNOSIS — R600 Localized edema: Secondary | ICD-10-CM | POA: Diagnosis not present

## 2016-11-03 DIAGNOSIS — I10 Essential (primary) hypertension: Secondary | ICD-10-CM | POA: Diagnosis not present

## 2016-11-16 DIAGNOSIS — E119 Type 2 diabetes mellitus without complications: Secondary | ICD-10-CM | POA: Diagnosis not present

## 2016-12-30 DIAGNOSIS — E1165 Type 2 diabetes mellitus with hyperglycemia: Secondary | ICD-10-CM | POA: Diagnosis not present

## 2016-12-30 DIAGNOSIS — Z0001 Encounter for general adult medical examination with abnormal findings: Secondary | ICD-10-CM | POA: Diagnosis not present

## 2016-12-30 DIAGNOSIS — I10 Essential (primary) hypertension: Secondary | ICD-10-CM | POA: Diagnosis not present

## 2016-12-30 DIAGNOSIS — R5383 Other fatigue: Secondary | ICD-10-CM | POA: Diagnosis not present

## 2017-01-20 DIAGNOSIS — R928 Other abnormal and inconclusive findings on diagnostic imaging of breast: Secondary | ICD-10-CM | POA: Diagnosis not present

## 2017-01-20 DIAGNOSIS — Z1231 Encounter for screening mammogram for malignant neoplasm of breast: Secondary | ICD-10-CM | POA: Diagnosis not present

## 2017-02-02 ENCOUNTER — Inpatient Hospital Stay
Admission: RE | Admit: 2017-02-02 | Discharge: 2017-02-02 | Disposition: A | Discharge status: Home / Self Care | Payer: Self-pay | Source: Ambulatory Visit | Attending: *Deleted | Admitting: *Deleted

## 2017-02-02 ENCOUNTER — Other Ambulatory Visit: Payer: Self-pay | Admitting: Nurse Practitioner

## 2017-02-02 ENCOUNTER — Other Ambulatory Visit: Payer: Self-pay | Admitting: *Deleted

## 2017-02-02 DIAGNOSIS — Z9289 Personal history of other medical treatment: Secondary | ICD-10-CM

## 2017-03-08 ENCOUNTER — Other Ambulatory Visit: Payer: Self-pay | Admitting: Nurse Practitioner

## 2017-03-08 ENCOUNTER — Other Ambulatory Visit: Payer: Self-pay | Admitting: Internal Medicine

## 2017-03-08 DIAGNOSIS — R922 Inconclusive mammogram: Secondary | ICD-10-CM

## 2017-04-04 ENCOUNTER — Other Ambulatory Visit: Payer: Self-pay

## 2017-04-05 ENCOUNTER — Ambulatory Visit (INDEPENDENT_AMBULATORY_CARE_PROVIDER_SITE_OTHER): Payer: Medicare Other | Admitting: Nurse Practitioner

## 2017-04-05 ENCOUNTER — Encounter: Payer: Self-pay | Admitting: Nurse Practitioner

## 2017-04-05 VITALS — BP 130/80 | HR 72 | Resp 16 | Ht 66.0 in | Wt 166.0 lb

## 2017-04-05 DIAGNOSIS — E538 Deficiency of other specified B group vitamins: Secondary | ICD-10-CM

## 2017-04-05 DIAGNOSIS — E782 Mixed hyperlipidemia: Secondary | ICD-10-CM | POA: Diagnosis not present

## 2017-04-05 DIAGNOSIS — Z8619 Personal history of other infectious and parasitic diseases: Secondary | ICD-10-CM

## 2017-04-05 DIAGNOSIS — IMO0002 Reserved for concepts with insufficient information to code with codable children: Secondary | ICD-10-CM | POA: Insufficient documentation

## 2017-04-05 DIAGNOSIS — R5383 Other fatigue: Secondary | ICD-10-CM | POA: Diagnosis not present

## 2017-04-05 DIAGNOSIS — R531 Weakness: Secondary | ICD-10-CM | POA: Insufficient documentation

## 2017-04-05 DIAGNOSIS — F10188 Alcohol abuse with other alcohol-induced disorder: Secondary | ICD-10-CM | POA: Insufficient documentation

## 2017-04-05 DIAGNOSIS — E119 Type 2 diabetes mellitus without complications: Secondary | ICD-10-CM | POA: Insufficient documentation

## 2017-04-05 DIAGNOSIS — E1165 Type 2 diabetes mellitus with hyperglycemia: Secondary | ICD-10-CM | POA: Insufficient documentation

## 2017-04-05 DIAGNOSIS — E1169 Type 2 diabetes mellitus with other specified complication: Secondary | ICD-10-CM | POA: Insufficient documentation

## 2017-04-05 HISTORY — DX: Deficiency of other specified B group vitamins: E53.8

## 2017-04-05 MED ORDER — CYANOCOBALAMIN 1000 MCG/ML IJ SOLN
1000.0000 ug | Freq: Once | INTRAMUSCULAR | Status: AC
  Administered 2017-04-05: 1000 ug via INTRAMUSCULAR

## 2017-04-05 NOTE — Progress Notes (Signed)
Subjective:     Patient ID: Adriana Miller, female   DOB: 1955-06-23, 61 y.o.   MRN: [id number]  Patient c/o significant fatigue. States that she is so tired, she can hardly get out of bed or cook meals. Labs ordered at last visit have not been done. States that b12 injections do not help at all.    Current Meds  Medication Sig  . ACCU-CHEK SOFTCLIX LANCETS lancets USE TO CHECK BLOOD SUGAR TWICE DAILY.  Marland Kitchen amLODipine (NORVASC) 5 MG tablet Take 5 mg by mouth daily.  Marland Kitchen glucose blood test strip USE TO CHECK BS TWICE DAILY. DX 250.02  . lisinopril (PRINIVIL,ZESTRIL) 20 MG tablet TAKE 1 TABLET BY MOUTH DAILY FOR BP   . Review of Systems  Constitutional: Positive for fatigue.  HENT: Negative.   Eyes: Negative.   Respiratory: Negative.  Negative for chest tightness and shortness of breath.   Cardiovascular: Negative for chest pain and palpitations.  Gastrointestinal: Negative.   Endocrine: Negative.   Genitourinary: Negative.   Musculoskeletal: Negative.   Skin: Negative.   Allergic/Immunologic: Negative.   Neurological: Positive for weakness.  Hematological: Negative.   Psychiatric/Behavioral: Positive for agitation.    Today's Vitals   04/05/17 1542  BP: 130/80  Pulse: 72  Resp: 16  SpO2: 97%  Weight: 166 lb (75.3 kg)  Height: 5\' 6"  (1.676 m)      Objective:   Physical Exam  Constitutional: She is oriented to person, place, and time. She appears well-developed and well-nourished.  HENT:  Head: Normocephalic and atraumatic.  Neck: Normal range of motion. Neck supple. No JVD present.  Cardiovascular: Normal rate and regular rhythm.  Pulmonary/Chest: Effort normal and breath sounds normal.  Mild, expiratory wheezines heard, bilaterally  Abdominal: Soft. Bowel sounds are normal. There is no tenderness.  Musculoskeletal: Normal range of motion.  Neurological: She is alert and oriented to person, place, and time.  Skin: Skin is warm and dry.  Psychiatric: She has a normal  mood and affect. Judgment and thought content normal. Her speech is delayed. She is agitated. Cognition and memory are impaired. She exhibits abnormal recent memory.       Assessment:     Fatigue, unspecified type - Plan: CBC With Differential, Comprehensive metabolic panel, TSH + free T4, Iron, TIBC and Ferritin Panel, Vitamin D 1,25 dihydroxy  Mixed hyperlipidemia  Alcohol abuse with other alcohol-induced disorder (HCC)  Vitamin B12 deficiency - Plan: cyanocobalamin ((VITAMIN B-12)) injection 1,000 mcg, Vitamin B12  Type 2 diabetes mellitus without complication, without long-term current use of insulin (HCC) - Plan: Hemoglobin A1C  History of hepatitis C - Plan: Hepatitis C Antibody     Plan:     Will see back in 6 weeks to review labs

## 2017-04-05 NOTE — Patient Instructions (Addendum)

## 2017-05-17 ENCOUNTER — Ambulatory Visit: Payer: Self-pay | Admitting: Nurse Practitioner

## 2017-08-01 ENCOUNTER — Other Ambulatory Visit: Payer: Self-pay | Admitting: Internal Medicine

## 2017-08-16 ENCOUNTER — Ambulatory Visit (INDEPENDENT_AMBULATORY_CARE_PROVIDER_SITE_OTHER): Payer: Medicare Other | Admitting: Nurse Practitioner

## 2017-08-16 ENCOUNTER — Encounter: Payer: Self-pay | Admitting: Nurse Practitioner

## 2017-08-16 VITALS — BP 136/68 | HR 70 | Resp 16 | Ht 66.0 in | Wt 167.8 lb

## 2017-08-16 DIAGNOSIS — J069 Acute upper respiratory infection, unspecified: Secondary | ICD-10-CM

## 2017-08-16 DIAGNOSIS — E538 Deficiency of other specified B group vitamins: Secondary | ICD-10-CM | POA: Diagnosis not present

## 2017-08-16 DIAGNOSIS — E119 Type 2 diabetes mellitus without complications: Secondary | ICD-10-CM

## 2017-08-16 DIAGNOSIS — I10 Essential (primary) hypertension: Secondary | ICD-10-CM | POA: Diagnosis not present

## 2017-08-16 DIAGNOSIS — E11649 Type 2 diabetes mellitus with hypoglycemia without coma: Secondary | ICD-10-CM

## 2017-08-16 LAB — POCT GLYCOSYLATED HEMOGLOBIN (HGB A1C): Hemoglobin A1C: 5.1

## 2017-08-16 MED ORDER — CYANOCOBALAMIN 1000 MCG/ML IJ SOLN
1000.0000 ug | Freq: Once | INTRAMUSCULAR | Status: AC
  Administered 2017-08-16: 1000 ug via INTRAMUSCULAR

## 2017-08-16 MED ORDER — AMLODIPINE BESYLATE 5 MG PO TABS: 5.0000 mg | ORAL_TABLET | Freq: Every day | ORAL | 4 refills | Status: DC

## 2017-08-16 MED ORDER — LISINOPRIL 20 MG PO TABS: 20.0000 mg | ORAL_TABLET | Freq: Every day | ORAL | 4 refills | Status: DC

## 2017-08-16 MED ORDER — AZITHROMYCIN 250 MG PO TABS: ORAL_TABLET | ORAL | 0 refills | Status: DC

## 2017-08-16 NOTE — Progress Notes (Signed)
[facility] [place], [place] [address]  Internal MEDICINE  Office Visit Note  Patient Name: Adriana Miller  [id number]  [id number]  Date of Service: 09/07/2017   Pt is here for a sick visit.  Chief Complaint  Patient presents with  . Fatigue    feeling tired with no energy since she was taken off her diabetic medication. no fevers or chillsr reported.     The patient states that she has felt fatigued and weak for the past week or so. Has chest and nasal congestion. Has brought up sputum with cough. Has felt nauseated and has vomited once or twice over this time period. She has been around a few people who have been sick.        Current Medication:  Outpatient Encounter Medications as of 08/16/2017  Medication Sig  . ACCU-CHEK SOFTCLIX LANCETS lancets USE TO CHECK BLOOD SUGAR TWICE DAILY.  Marland Kitchen amLODipine (NORVASC) 5 MG tablet Take 1 tablet (5 mg total) by mouth daily.  Marland Kitchen glucose blood test strip USE TO CHECK BS TWICE DAILY. DX 250.02  . lisinopril (PRINIVIL,ZESTRIL) 20 MG tablet Take 1 tablet (20 mg total) by mouth daily.  . [DISCONTINUED] amLODipine (NORVASC) 5 MG tablet Take 5 mg by mouth daily.  . [DISCONTINUED] lisinopril (PRINIVIL,ZESTRIL) 20 MG tablet TAKE 1 TABLET BY MOUTH DAILY FOR BP  . azithromycin (ZITHROMAX) 250 MG tablet z-pack - take as directed for 5 days  . [EXPIRED] cyanocobalamin ((VITAMIN B-12)) injection 1,000 mcg    No facility-administered encounter medications on file as of 08/16/2017.       Medical History: Past Medical History:  Diagnosis Date  . Chronic kidney disease   . Diabetes mellitus without complication ([place])   . Hypertension      Today's Vitals   08/16/17 1420  BP: 136/68  Pulse: 70  Resp: 16  SpO2: 98%  Weight: 167 lb 12.8 oz (76.1 kg)  Height: 5\' 6"  (1.676 m)    Review of Systems  Constitutional: Positive for fatigue. Negative for chills and unexpected weight change.  HENT: Positive for  congestion, ear pain, postnasal drip, rhinorrhea and sinus pressure. Negative for sneezing and sore throat.   Eyes: Negative.  Negative for redness.  Respiratory: Negative for cough, chest tightness, shortness of breath and wheezing.   Cardiovascular: Negative for chest pain and palpitations.  Gastrointestinal: Positive for nausea. Negative for abdominal pain, constipation, diarrhea and vomiting.  Endocrine:       Blood sugars doing well   Genitourinary: Negative for dysuria and frequency.  Musculoskeletal: Negative for arthralgias, back pain, joint swelling and neck pain.  Skin: Negative for rash.  Allergic/Immunologic: Positive for environmental allergies.  Neurological: Positive for dizziness and headaches. Negative for tremors and numbness.  Hematological: Negative for adenopathy. Does not bruise/bleed easily.  Psychiatric/Behavioral: Negative for behavioral problems (Depression), sleep disturbance and suicidal ideas. The patient is nervous/anxious.     Physical Exam  Constitutional: She is oriented to person, place, and time. She appears well-developed and well-nourished. No distress.  HENT:  Head: Normocephalic and atraumatic.  Right Ear: Tympanic membrane is erythematous and bulging.  Left Ear: Tympanic membrane is erythematous and bulging.  Nose: Rhinorrhea present. Right sinus exhibits frontal sinus tenderness. Left sinus exhibits frontal sinus tenderness.  Mouth/Throat: Oropharynx is clear and moist. No oropharyngeal exudate.  Eyes: Pupils are equal, round, and reactive to light. EOM are normal.  Neck: Normal range of motion. Neck supple. No JVD present. No tracheal deviation  present. No thyromegaly present.  Cardiovascular: Normal rate, regular rhythm and normal heart sounds. Exam reveals no gallop and no friction rub.  No murmur heard. Pulmonary/Chest: Effort normal and breath sounds normal. No respiratory distress. She has no wheezes. She has no rales. She exhibits no  tenderness.  Abdominal: Soft. Bowel sounds are normal. There is no tenderness.  Musculoskeletal: Normal range of motion.  Lymphadenopathy:    She has cervical adenopathy.  Neurological: She is alert and oriented to person, place, and time. No cranial nerve deficit.  Skin: Skin is warm and dry. She is not diaphoretic.  Psychiatric: Her behavior is normal. Judgment and thought content normal. Her mood appears anxious.  Nursing note and vitals reviewed.  Assessment/Plan: 1. Acute upper respiratory infection Will start z-pack. Take as directed for 5 days. Rest and increase fluids. Use OTC medications to treat symptoms.  - azithromycin (ZITHROMAX) 250 MG tablet; z-pack - take as directed for 5 days  Dispense: 6 tablet; Refill: 0  2. Type 2 diabetes mellitus without complication, without long-term current use of insulin (HCC) - POCT HgB A1C 5.1 today. Continue diabetic medication as prescribed.   3. Essential hypertension Stable. Continue bp medication as prescribed.  - amLODipine (NORVASC) 5 MG tablet; Take 1 tablet (5 mg total) by mouth daily.  Dispense: 90 tablet; Refill: 4 - lisinopril (PRINIVIL,ZESTRIL) 20 MG tablet; Take 1 tablet (20 mg total) by mouth daily.  Dispense: 90 tablet; Refill: 4  4. B12 deficiency - cyanocobalamin ((VITAMIN B-12)) injection 1,000 mcg  General Counseling: Caidyn verbalizes understanding of the findings of todays visit and agrees with plan of treatment. I have discussed any further diagnostic evaluation that may be needed or ordered today. We also reviewed her medications today. she has been encouraged to call the office with any questions or concerns that should arise related to todays visit.  Rest and increase fluids. Continue using OTC medication to control symptoms.   This patient was seen by Leretha Pol, FNP- C in Collaboration with Dr Lavera Guise as a part of collaborative care agreement    Orders Placed This Encounter  Procedures  . POCT HgB  A1C    Meds ordered this encounter  Medications  . azithromycin (ZITHROMAX) 250 MG tablet    Sig: z-pack - take as directed for 5 days    Dispense:  6 tablet    Refill:  0    Order Specific Question:   Supervising Provider    Answer:   Lavera Guise [place]  . amLODipine (NORVASC) 5 MG tablet    Sig: Take 1 tablet (5 mg total) by mouth daily.    Dispense:  90 tablet    Refill:  4    Order Specific Question:   Supervising Provider    Answer:   Lavera Guise [[id number]]  . lisinopril (PRINIVIL,ZESTRIL) 20 MG tablet    Sig: Take 1 tablet (20 mg total) by mouth daily.    Dispense:  90 tablet    Refill:  4    Order Specific Question:   Supervising Provider    Answer:   Lavera Guise [[id number]]  . cyanocobalamin ((VITAMIN B-12)) injection 1,000 mcg    Time spent: 25 Minutes

## 2017-08-22 ENCOUNTER — Telehealth: Payer: Self-pay | Admitting: Nurse Practitioner

## 2017-08-22 NOTE — Telephone Encounter (Signed)
Medical records faxed to [facility]

## 2017-09-07 DIAGNOSIS — E1159 Type 2 diabetes mellitus with other circulatory complications: Secondary | ICD-10-CM | POA: Insufficient documentation

## 2017-09-07 DIAGNOSIS — J069 Acute upper respiratory infection, unspecified: Secondary | ICD-10-CM | POA: Insufficient documentation

## 2017-09-07 DIAGNOSIS — I10 Essential (primary) hypertension: Secondary | ICD-10-CM | POA: Insufficient documentation

## 2017-10-13 DIAGNOSIS — Z79899 Other long term (current) drug therapy: Secondary | ICD-10-CM | POA: Diagnosis not present

## 2017-10-13 DIAGNOSIS — I208 Other forms of angina pectoris: Secondary | ICD-10-CM | POA: Diagnosis not present

## 2017-12-22 ENCOUNTER — Ambulatory Visit: Payer: Self-pay | Admitting: Adult Health

## 2017-12-29 ENCOUNTER — Ambulatory Visit (INDEPENDENT_AMBULATORY_CARE_PROVIDER_SITE_OTHER): Payer: Medicare Other | Admitting: Adult Health

## 2017-12-29 ENCOUNTER — Encounter: Payer: Self-pay | Admitting: Adult Health

## 2017-12-29 VITALS — BP 108/68 | HR 85 | Resp 16 | Ht 65.0 in | Wt 159.0 lb

## 2017-12-29 DIAGNOSIS — R29898 Other symptoms and signs involving the musculoskeletal system: Secondary | ICD-10-CM | POA: Diagnosis not present

## 2017-12-29 DIAGNOSIS — E119 Type 2 diabetes mellitus without complications: Secondary | ICD-10-CM

## 2017-12-29 DIAGNOSIS — I10 Essential (primary) hypertension: Secondary | ICD-10-CM | POA: Diagnosis not present

## 2017-12-29 DIAGNOSIS — Z0001 Encounter for general adult medical examination with abnormal findings: Secondary | ICD-10-CM | POA: Diagnosis not present

## 2017-12-29 LAB — POCT GLYCOSYLATED HEMOGLOBIN (HGB A1C): Hemoglobin A1C: 5 % (ref 4.0–5.6)

## 2017-12-29 NOTE — Progress Notes (Signed)
[facility] [place], [place] [address]  Internal MEDICINE  Office Visit Note  Patient Name: Adriana Miller  [id number]  [id number]  Date of Service: 01/09/2018  Chief Complaint  Patient presents with  . Annual Exam    annual medicare wellness visit   . Hypertension  . Quality Metric Gaps    pap smear   . Diabetes    pt states no longer taking medication but would like her a1c checked   HPI Pt is here for routine health maintenance examination. She has a history of htn, diabetes. She reports she is taking her blood pressure medications without difficulty. She reports that she is no longer diabetic, and does not take any medications.  Her A1C is currently 5.0.  She reports a good appetitie, and her only complaint is that her legs are week and the cane she is using is not working.  She had a fall last week and hit her head.  She denies seeking treatment for that, but would like a walker, and possibly a home health aid to help her in and out of the shower.  She has been afraid to shower, because she lives alone and doesn't want to fall due to her leg weakness and balance issues.    Current Medication: Outpatient Encounter Medications as of 12/29/2017  Medication Sig  . amLODipine (NORVASC) 5 MG tablet Take 1 tablet (5 mg total) by mouth daily.  Marland Kitchen lisinopril (PRINIVIL,ZESTRIL) 20 MG tablet Take 1 tablet (20 mg total) by mouth daily.  Marland Kitchen ACCU-CHEK SOFTCLIX LANCETS lancets USE TO CHECK BLOOD SUGAR TWICE DAILY.  Marland Kitchen azithromycin (ZITHROMAX) 250 MG tablet z-pack - take as directed for 5 days (Patient not taking: Reported on 12/29/2017)  . glucose blood test strip USE TO CHECK BS TWICE DAILY. DX 250.02   No facility-administered encounter medications on file as of 12/29/2017.    Surgical History: Past Surgical History:  Procedure Laterality Date  . CEREBRAL ANEURYSM REPAIR      Medical History: Past Medical History:  Diagnosis Date  . Chronic kidney disease    . Diabetes mellitus without complication ([place])   . Hypertension     Family History: Family History  Problem Relation Age of Onset  . Cancer Brother     Functional Status Survey: Is the patient deaf or have difficulty hearing?: No Does the patient have difficulty seeing, even when wearing glasses/contacts?: No Does the patient have difficulty concentrating, remembering, or making decisions?: Yes Does the patient have difficulty walking or climbing stairs?: Yes Does the patient have difficulty dressing or bathing?: No Does the patient have difficulty doing errands alone such as visiting a doctor's office or shopping?: No   Depression screen [facility] 2/9 12/29/2017 08/16/2017  Decreased Interest 0 0  Down, Depressed, Hopeless 0 0  PHQ - 2 Score 0 0   Fall Risk  12/29/2017 08/16/2017  Falls in the past year? Yes Yes  Number falls in past yr: 1 1  Injury with Fall? Yes No  Risk for fall due to : Impaired balance/gait -   MMSE - Mini Mental State Exam 12/29/2017  Orientation to time 5  Orientation to Place 5  Registration 3  Attention/ Calculation 5  Recall 3  Language- name 2 objects 2  Language- repeat 1  Language- follow 3 step command 3  Language- read & follow direction 1  Write a sentence 1  Copy design 1  Total score 30    Review of  Systems  Constitutional: Negative for chills, fatigue and unexpected weight change.  HENT: Negative for congestion, rhinorrhea, sneezing and sore throat.   Eyes: Negative for photophobia, pain and redness.  Respiratory: Negative for cough, chest tightness and shortness of breath.   Cardiovascular: Negative for chest pain and palpitations.  Gastrointestinal: Negative for abdominal pain, constipation, diarrhea, nausea and vomiting.  Endocrine: Negative.   Genitourinary: Negative for dysuria and frequency.  Musculoskeletal: Negative for arthralgias, back pain, joint swelling and neck pain.  Skin: Negative for rash.  Allergic/Immunologic:  Negative.   Neurological: Negative for tremors and numbness.       Pt reports balance issues even with cane.   Hematological: Negative for adenopathy. Does not bruise/bleed easily.  Psychiatric/Behavioral: Negative for behavioral problems and sleep disturbance. The patient is not nervous/anxious.    Vital Signs: BP 108/68   Pulse 85   Resp 16   Ht 5\' 5"  (1.651 m)   Wt 159 lb (72.1 kg)   SpO2 95%   BMI 26.46 kg/m   Physical Exam  Constitutional: She is oriented to person, place, and time. She appears well-developed and well-nourished. No distress.  HENT:  Head: Normocephalic and atraumatic.  Mouth/Throat: Oropharynx is clear and moist. No oropharyngeal exudate.  Eyes: Pupils are equal, round, and reactive to light. EOM are normal.  Neck: Normal range of motion. Neck supple. No JVD present. No tracheal deviation present. No thyromegaly present.  Cardiovascular: Normal rate, regular rhythm and normal heart sounds. Exam reveals no gallop and no friction rub.  No murmur heard. Pulmonary/Chest: Effort normal and breath sounds normal. No respiratory distress. She has no wheezes. She has no rales. She exhibits no tenderness.  Abdominal: Soft. There is no tenderness. There is no guarding.  Musculoskeletal: Normal range of motion.  Weakness of lower extremities.  Lymphadenopathy:    She has no cervical adenopathy.  Neurological: She is alert and oriented to person, place, and time. No cranial nerve deficit.  Skin: Skin is warm and dry. She is not diaphoretic.  Psychiatric: She has a normal mood and affect. Her behavior is normal. Judgment and thought content normal.  Nursing note and vitals reviewed.  LABS: Recent Results (from the past 2160 hour(s))  POCT HgB A1C     Status: None   Collection Time: 12/29/17  2:38 PM  Result Value Ref Range   Hemoglobin A1C 5.0 4.0 - 5.6 %   HbA1c POC (<> result, manual entry)     HbA1c, POC (prediabetic range)     HbA1c, POC (controlled diabetic  range)      Assessment/Plan: 1. Encounter for general adult medical examination with abnormal findings - POCT HgB A1C - CBC with Differential/Platelet - Lipid Panel With LDL/HDL Ratio - TSH - T4, free - Comprehensive metabolic panel  2. Essential hypertension Controlled with lisinopril.  Continue as ordered.    3. Weakness of both lower extremities - RX for Rolator walker given - Ambulatory referral to [facility]  4. Type 2 diabetes mellitus without complication, without long-term current use of insulin (HCC) HgA1C is 5.0 today.  Pt not taking DM medications. DM2 appears to have resolved with weight loss.   General Counseling: nicola heinemann understanding of the findings of todays visit and agrees with plan of treatment. I have discussed any further diagnostic evaluation that may be needed or ordered today. We also reviewed her medications today. she has been encouraged to call the office with any questions or concerns that should arise related to  todays visit.   Orders Placed This Encounter  Procedures  . CBC with Differential/Platelet  . Lipid Panel With LDL/HDL Ratio  . TSH  . T4, free  . Comprehensive metabolic panel  . Ambulatory referral to Home Health  . POCT HgB A1C    Time spent: 25 Minutes  This patient was seen by Orson Gear AGNP-C in Collaboration with Dr Lavera Guise as a part of collaborative care agreement   Lavera Guise, MD  Internal Medicine

## 2017-12-29 NOTE — Patient Instructions (Signed)

## 2018-01-18 ENCOUNTER — Telehealth: Payer: Self-pay

## 2018-01-18 NOTE — Telephone Encounter (Signed)
Faxed completed and signed therapeutic footwear form to [employer]

## 2018-01-27 DIAGNOSIS — R5381 Other malaise: Secondary | ICD-10-CM | POA: Diagnosis not present

## 2018-01-27 DIAGNOSIS — E079 Disorder of thyroid, unspecified: Secondary | ICD-10-CM | POA: Diagnosis not present

## 2018-01-27 DIAGNOSIS — Z13220 Encounter for screening for lipoid disorders: Secondary | ICD-10-CM | POA: Diagnosis not present

## 2018-01-27 DIAGNOSIS — E0781 Sick-euthyroid syndrome: Secondary | ICD-10-CM | POA: Diagnosis not present

## 2018-01-27 DIAGNOSIS — Z0001 Encounter for general adult medical examination with abnormal findings: Secondary | ICD-10-CM | POA: Diagnosis not present

## 2018-01-27 DIAGNOSIS — R5383 Other fatigue: Secondary | ICD-10-CM | POA: Diagnosis not present

## 2018-01-28 LAB — LIPID PANEL WITH LDL/HDL RATIO
Cholesterol, Total: 134 mg/dL (ref 100–199)
HDL: 80 mg/dL
LDL Calculated: 42 mg/dL (ref 0–99)
LDl/HDL Ratio: 0.5 ratio (ref 0.0–3.2)
Triglycerides: 60 mg/dL (ref 0–149)
VLDL Cholesterol Cal: 12 mg/dL (ref 5–40)

## 2018-01-28 LAB — COMPREHENSIVE METABOLIC PANEL
ALBUMIN: 4.2 g/dL (ref 3.6–4.8)
ALT: 27 IU/L (ref 0–32)
AST: 53 IU/L — ABNORMAL HIGH (ref 0–40)
Albumin/Globulin Ratio: 1 — ABNORMAL LOW (ref 1.2–2.2)
Alkaline Phosphatase: 56 IU/L (ref 39–117)
BILIRUBIN TOTAL: 0.5 mg/dL (ref 0.0–1.2)
BUN/Creatinine Ratio: 13 (ref 12–28)
BUN: 13 mg/dL (ref 8–27)
CALCIUM: 9.5 mg/dL (ref 8.7–10.3)
CHLORIDE: 96 mmol/L (ref 96–106)
CO2: 19 mmol/L — ABNORMAL LOW (ref 20–29)
CREATININE: 1.01 mg/dL — AB (ref 0.57–1.00)
GFR, EST AFRICAN AMERICAN: 68 mL/min/{1.73_m2} (ref >59)
GFR, EST NON AFRICAN AMERICAN: 59 mL/min/{1.73_m2} — AB (ref >59)
GLUCOSE: 110 mg/dL — AB (ref 65–99)
Globulin, Total: 4.2 g/dL (ref 1.5–4.5)
Potassium: 4.8 mmol/L (ref 3.5–5.2)
Sodium: 131 mmol/L — ABNORMAL LOW (ref 134–144)
TOTAL PROTEIN: 8.4 g/dL (ref 6.0–8.5)

## 2018-01-28 LAB — CBC WITH DIFFERENTIAL/PLATELET
Basophils Absolute: 0 x10E3/uL (ref 0.0–0.2)
Basos: 0 %
EOS (ABSOLUTE): 0 x10E3/uL (ref 0.0–0.4)
Eos: 0 %
Hematocrit: 35.8 % (ref 34.0–46.6)
Hemoglobin: 12.3 g/dL (ref 11.1–15.9)
Immature Grans (Abs): 0 x10E3/uL (ref 0.0–0.1)
Immature Granulocytes: 0 %
Lymphocytes Absolute: 2 x10E3/uL (ref 0.7–3.1)
Lymphs: 40 %
MCH: 30.7 pg (ref 26.6–33.0)
MCHC: 34.4 g/dL (ref 31.5–35.7)
MCV: 89 fL (ref 79–97)
Monocytes Absolute: 0.5 x10E3/uL (ref 0.1–0.9)
Monocytes: 9 %
Neutrophils Absolute: 2.6 x10E3/uL (ref 1.4–7.0)
Neutrophils: 51 %
Platelets: 120 x10E3/uL — ABNORMAL LOW (ref 150–450)
RBC: 4.01 x10E6/uL (ref 3.77–5.28)
RDW: 12 % — ABNORMAL LOW (ref 12.3–15.4)
WBC: 5 x10E3/uL (ref 3.4–10.8)

## 2018-01-28 LAB — TSH: TSH: 1.39 u[IU]/mL (ref 0.450–4.500)

## 2018-01-28 LAB — T4, FREE: FREE T4: 1.03 ng/dL (ref 0.82–1.77)

## 2018-02-02 DIAGNOSIS — M1990 Unspecified osteoarthritis, unspecified site: Secondary | ICD-10-CM | POA: Diagnosis not present

## 2018-02-02 DIAGNOSIS — E119 Type 2 diabetes mellitus without complications: Secondary | ICD-10-CM | POA: Diagnosis not present

## 2018-03-30 ENCOUNTER — Ambulatory Visit: Payer: Self-pay | Admitting: Adult Health

## 2018-04-21 ENCOUNTER — Ambulatory Visit: Payer: Self-pay | Admitting: Adult Health

## 2018-05-18 ENCOUNTER — Encounter: Payer: Self-pay | Admitting: Nurse Practitioner

## 2018-05-18 ENCOUNTER — Ambulatory Visit (INDEPENDENT_AMBULATORY_CARE_PROVIDER_SITE_OTHER): Payer: Medicare Other | Admitting: Nurse Practitioner

## 2018-05-18 VITALS — BP 149/70 | HR 77 | Resp 16 | Ht 66.0 in | Wt 164.2 lb

## 2018-05-18 DIAGNOSIS — I10 Essential (primary) hypertension: Secondary | ICD-10-CM

## 2018-05-18 DIAGNOSIS — E119 Type 2 diabetes mellitus without complications: Secondary | ICD-10-CM

## 2018-05-18 LAB — POCT GLYCOSYLATED HEMOGLOBIN (HGB A1C): Hemoglobin A1C: 5.1 % (ref 4.0–5.6)

## 2018-05-18 NOTE — Progress Notes (Signed)
[facility] [place], [place] [address]  Internal MEDICINE  Office Visit Note  Patient Name: Adriana Miller  [id number]  [id number]  Date of Service: 05/18/2018  Chief Complaint  Patient presents with  . Medical Management of Chronic Issues    3 month follow up  . Labs Only    The patient is here for routine follow up visit. Blood sugars well controlled. HgbA1c is 5.1 today. She states that she has had "a little cold" but is otherwise doing well.  She is here with her "payee." they have asked for a letter stating that the patient does not need to have a payee. They went to social services today to try and have him removed from this status, but they request a letter from the primary care stating this she does not require this. The patient lives alone and has for years. She is able to prepare food and clean up. She is able to bathe and dress herself without difficulty. She has been paying her bills and rent on her own without difficulty.       Current Medication: Outpatient Encounter Medications as of 05/18/2018  Medication Sig  . ACCU-CHEK SOFTCLIX LANCETS lancets USE TO CHECK BLOOD SUGAR TWICE DAILY.  Marland Kitchen amLODipine (NORVASC) 5 MG tablet Take 1 tablet (5 mg total) by mouth daily.  Marland Kitchen glucose blood test strip USE TO CHECK BS TWICE DAILY. DX 250.02  . lisinopril (PRINIVIL,ZESTRIL) 20 MG tablet Take 1 tablet (20 mg total) by mouth daily.  . [DISCONTINUED] azithromycin (ZITHROMAX) 250 MG tablet z-pack - take as directed for 5 days (Patient not taking: Reported on 12/29/2017)   No facility-administered encounter medications on file as of 05/18/2018.     Surgical History: Past Surgical History:  Procedure Laterality Date  . CEREBRAL ANEURYSM REPAIR      Medical History: Past Medical History:  Diagnosis Date  . Chronic kidney disease   . Diabetes mellitus without complication ([place])   . Hypertension     Family History: Family History  Problem Relation  Age of Onset  . Cancer Brother     Social History   Socioeconomic History  . Marital status: Married    Spouse name: Not on file  . Number of children: Not on file  . Years of education: Not on file  . Highest education level: Not on file  Occupational History  . Not on file  Social Needs  . Financial resource strain: Not on file  . Food insecurity:    Worry: Not on file    Inability: Not on file  . Transportation needs:    Medical: Not on file    Non-medical: Not on file  Tobacco Use  . Smoking status: Never Smoker  . Smokeless tobacco: Current User    Types: Snuff  Substance and Sexual Activity  . Alcohol use: Yes    Alcohol/week: 2.0 standard drinks    Types: 2 Cans of beer per week    Comment: 1 to 2 a day  . Drug use: No  . Sexual activity: Not on file  Lifestyle  . Physical activity:    Days per week: Not on file    Minutes per session: Not on file  . Stress: Not on file  Relationships  . Social connections:    Talks on phone: Not on file    Gets together: Not on file    Attends religious service: Not on file    Active member of  club or organization: Not on file    Attends meetings of clubs or organizations: Not on file    Relationship status: Not on file  . Intimate partner violence:    Fear of current or ex partner: Not on file    Emotionally abused: Not on file    Physically abused: Not on file    Forced sexual activity: Not on file  Other Topics Concern  . Not on file  Social History Narrative  . Not on file      Review of Systems  Constitutional: Negative for activity change, chills, fatigue and unexpected weight change.  HENT: Positive for congestion. Negative for postnasal drip, rhinorrhea, sneezing and sore throat.   Respiratory: Positive for wheezing. Negative for cough, chest tightness and shortness of breath.   Cardiovascular: Negative for chest pain and palpitations.  Gastrointestinal: Negative for abdominal pain, constipation,  diarrhea, nausea and vomiting.  Endocrine: Negative for cold intolerance, heat intolerance, polydipsia and polyuria.       Blood sugars doing well   Musculoskeletal: Negative for arthralgias, back pain, joint swelling and neck pain.  Skin: Negative for rash.  Neurological: Negative for dizziness, tremors, numbness and headaches.  Hematological: Negative for adenopathy. Does not bruise/bleed easily.  Psychiatric/Behavioral: Negative for behavioral problems (Depression), sleep disturbance and suicidal ideas. The patient is not nervous/anxious.     Today's Vitals   05/18/18 1425  BP: (!) 149/70  Pulse: 77  Resp: 16  SpO2: 100%  Weight: 164 lb 3.2 oz (74.5 kg)  Height: 5\' 6"  (1.676 m)   Body mass index is 26.5 kg/m.  Physical Exam Vitals signs and nursing note reviewed.  Constitutional:      General: She is not in acute distress.    Appearance: Normal appearance. She is well-developed. She is not diaphoretic.  HENT:     Head: Normocephalic and atraumatic.     Nose: Nose normal.     Mouth/Throat:     Pharynx: No oropharyngeal exudate.  Eyes:     Pupils: Pupils are equal, round, and reactive to light.  Neck:     Musculoskeletal: Normal range of motion and neck supple.     Thyroid: No thyromegaly.     Vascular: No carotid bruit or JVD.     Trachea: No tracheal deviation.  Cardiovascular:     Rate and Rhythm: Normal rate and regular rhythm.     Heart sounds: Normal heart sounds. No murmur. No friction rub. No gallop.   Pulmonary:     Effort: Pulmonary effort is normal. No respiratory distress.     Breath sounds: Wheezing present. No rales.     Comments: Very scant wheezing auscultated I bilateral lung bases.  Chest:     Chest wall: No tenderness.  Abdominal:     General: Bowel sounds are normal.     Palpations: Abdomen is soft.  Musculoskeletal: Normal range of motion.  Lymphadenopathy:     Cervical: No cervical adenopathy.  Skin:    General: Skin is warm and dry.   Neurological:     General: No focal deficit present.     Mental Status: She is alert and oriented to person, place, and time. Mental status is at baseline.     Cranial Nerves: No cranial nerve deficit.  Psychiatric:        Behavior: Behavior normal.        Thought Content: Thought content normal.        Judgment: Judgment normal.    Assessment/Plan:  1. Type 2 diabetes mellitus without complication, without long-term current use of insulin (HCC) - POCT HgB A1C 5.1 today. Continue to manage without medication. Monitor closely.  2. Essential hypertension Stable. Continue blood pressure medication as prescribed .   General Counseling: donie moulton understanding of the findings of todays visit and agrees with plan of treatment. I have discussed any further diagnostic evaluation that may be needed or ordered today. We also reviewed her medications today. she has been encouraged to call the office with any questions or concerns that should arise related to todays visit.  Will compose a letter for department of social services advising them that patient does not need to have a financial payee. She is capable of managing her own finances and has been for many years. Will contact patient when letter is ready to be picked up.   Orders Placed This Encounter  Procedures  . POCT HgB A1C     Time spent: 25 Minutes      Dr Lavera Guise Internal medicine

## 2018-05-23 ENCOUNTER — Telehealth: Payer: Self-pay

## 2018-05-23 NOTE — Telephone Encounter (Signed)
error 

## 2018-05-24 ENCOUNTER — Telehealth: Payer: Self-pay | Admitting: Nurse Practitioner

## 2018-05-24 ENCOUNTER — Telehealth: Payer: Self-pay

## 2018-05-24 NOTE — Telephone Encounter (Signed)
Pt advised letter ready for pickup

## 2018-05-24 NOTE — Telephone Encounter (Signed)
Will be ready later today. I wrote it, Tat is typing it up for me. Will let you know when it's ready.

## 2018-06-20 ENCOUNTER — Encounter: Payer: Self-pay | Admitting: Nurse Practitioner

## 2018-08-01 ENCOUNTER — Other Ambulatory Visit: Payer: Self-pay | Admitting: Nurse Practitioner

## 2018-08-01 DIAGNOSIS — I10 Essential (primary) hypertension: Secondary | ICD-10-CM

## 2018-08-01 MED ORDER — ENALAPRIL MALEATE 2.5 MG PO TABS: 2.5000 mg | ORAL_TABLET | Freq: Every day | ORAL | 1 refills | Status: DC

## 2018-08-02 ENCOUNTER — Other Ambulatory Visit: Payer: Self-pay | Admitting: Nurse Practitioner

## 2018-08-02 DIAGNOSIS — I10 Essential (primary) hypertension: Secondary | ICD-10-CM

## 2018-08-02 MED ORDER — ENALAPRIL MALEATE 10 MG PO TABS: 5.0000 mg | ORAL_TABLET | Freq: Every day | ORAL | 2 refills | Status: DC

## 2018-08-02 NOTE — Telephone Encounter (Signed)
Changed enalapril to 10mg , taking 1/2 tablet daily due to backorder of 2.5mg  tablets. Reassess at next visit.

## 2018-08-02 NOTE — Progress Notes (Signed)
Changed enalapril to 10mg , taking 1/2 tablet daily due to backorder of 2.5mg  tablets. Reassess at next visit.

## 2018-08-21 ENCOUNTER — Other Ambulatory Visit: Payer: Self-pay

## 2018-08-27 ENCOUNTER — Other Ambulatory Visit: Payer: Self-pay | Admitting: Internal Medicine

## 2018-09-15 ENCOUNTER — Ambulatory Visit: Payer: Self-pay | Admitting: Nurse Practitioner

## 2018-10-09 ENCOUNTER — Emergency Department: Payer: Medicare Other

## 2018-10-09 ENCOUNTER — Emergency Department
Admission: EM | Admit: 2018-10-09 | Discharge: 2018-10-09 | Disposition: A | Discharge status: Home / Self Care | Payer: Medicare Other | Attending: Emergency Medicine | Admitting: Emergency Medicine

## 2018-10-09 ENCOUNTER — Encounter: Payer: Self-pay | Admitting: Nurse Practitioner

## 2018-10-09 ENCOUNTER — Ambulatory Visit (INDEPENDENT_AMBULATORY_CARE_PROVIDER_SITE_OTHER): Payer: Medicare Other | Admitting: Nurse Practitioner

## 2018-10-09 ENCOUNTER — Other Ambulatory Visit: Payer: Self-pay

## 2018-10-09 ENCOUNTER — Encounter: Payer: Self-pay | Admitting: Intensive Care

## 2018-10-09 VITALS — BP 102/65 | HR 91 | Temp 98.1°F | Resp 16 | Ht 66.0 in | Wt 148.0 lb

## 2018-10-09 DIAGNOSIS — N189 Chronic kidney disease, unspecified: Secondary | ICD-10-CM | POA: Diagnosis not present

## 2018-10-09 DIAGNOSIS — I5189 Other ill-defined heart diseases: Secondary | ICD-10-CM

## 2018-10-09 DIAGNOSIS — I129 Hypertensive chronic kidney disease with stage 1 through stage 4 chronic kidney disease, or unspecified chronic kidney disease: Secondary | ICD-10-CM | POA: Insufficient documentation

## 2018-10-09 DIAGNOSIS — E1165 Type 2 diabetes mellitus with hyperglycemia: Secondary | ICD-10-CM | POA: Diagnosis not present

## 2018-10-09 DIAGNOSIS — E1122 Type 2 diabetes mellitus with diabetic chronic kidney disease: Secondary | ICD-10-CM | POA: Diagnosis not present

## 2018-10-09 DIAGNOSIS — I4891 Unspecified atrial fibrillation: Secondary | ICD-10-CM

## 2018-10-09 DIAGNOSIS — R5383 Other fatigue: Secondary | ICD-10-CM

## 2018-10-09 DIAGNOSIS — R531 Weakness: Secondary | ICD-10-CM

## 2018-10-09 DIAGNOSIS — F17290 Nicotine dependence, other tobacco product, uncomplicated: Secondary | ICD-10-CM | POA: Diagnosis not present

## 2018-10-09 DIAGNOSIS — R0609 Other forms of dyspnea: Secondary | ICD-10-CM | POA: Diagnosis not present

## 2018-10-09 DIAGNOSIS — E119 Type 2 diabetes mellitus without complications: Secondary | ICD-10-CM

## 2018-10-09 LAB — CBC
HCT: 36.7 % (ref 36.0–46.0)
Hemoglobin: 12 g/dL (ref 12.0–15.0)
MCH: 31.7 pg (ref 26.0–34.0)
MCHC: 32.7 g/dL (ref 30.0–36.0)
MCV: 97.1 fL (ref 80.0–100.0)
Platelets: 104 10*3/uL — ABNORMAL LOW (ref 150–400)
RBC: 3.78 MIL/uL — ABNORMAL LOW (ref 3.87–5.11)
RDW: 12.9 % (ref 11.5–15.5)
WBC: 4.4 10*3/uL (ref 4.0–10.5)
nRBC: 0 % (ref 0.0–0.2)

## 2018-10-09 LAB — BASIC METABOLIC PANEL
Anion gap: 12 (ref 5–15)
BUN: 6 mg/dL — ABNORMAL LOW (ref 8–23)
CO2: 21 mmol/L — ABNORMAL LOW (ref 22–32)
Calcium: 8.6 mg/dL — ABNORMAL LOW (ref 8.9–10.3)
Chloride: 99 mmol/L (ref 98–111)
Creatinine, Ser: 0.89 mg/dL (ref 0.44–1.00)
GFR calc Af Amer: >60 mL/min (ref >60)
GFR calc non Af Amer: >60 mL/min (ref >60)
Glucose, Bld: 130 mg/dL — ABNORMAL HIGH (ref 70–99)
Potassium: 3.4 mmol/L — ABNORMAL LOW (ref 3.5–5.1)
Sodium: 132 mmol/L — ABNORMAL LOW (ref 135–145)

## 2018-10-09 LAB — POCT GLYCOSYLATED HEMOGLOBIN (HGB A1C): Hemoglobin A1C: 5.3 % (ref 4.0–5.6)

## 2018-10-09 LAB — TROPONIN I: Troponin I: <0.03 ng/mL (ref <0.03)

## 2018-10-09 IMAGING — CR CHEST - 2 VIEW
2 series · 2 of 2 positions shown · non-contrast
Comparison: None.

CLINICAL DATA: New onset atrial fibrillation

EXAM:
CHEST - 2 VIEW

[chest pa]
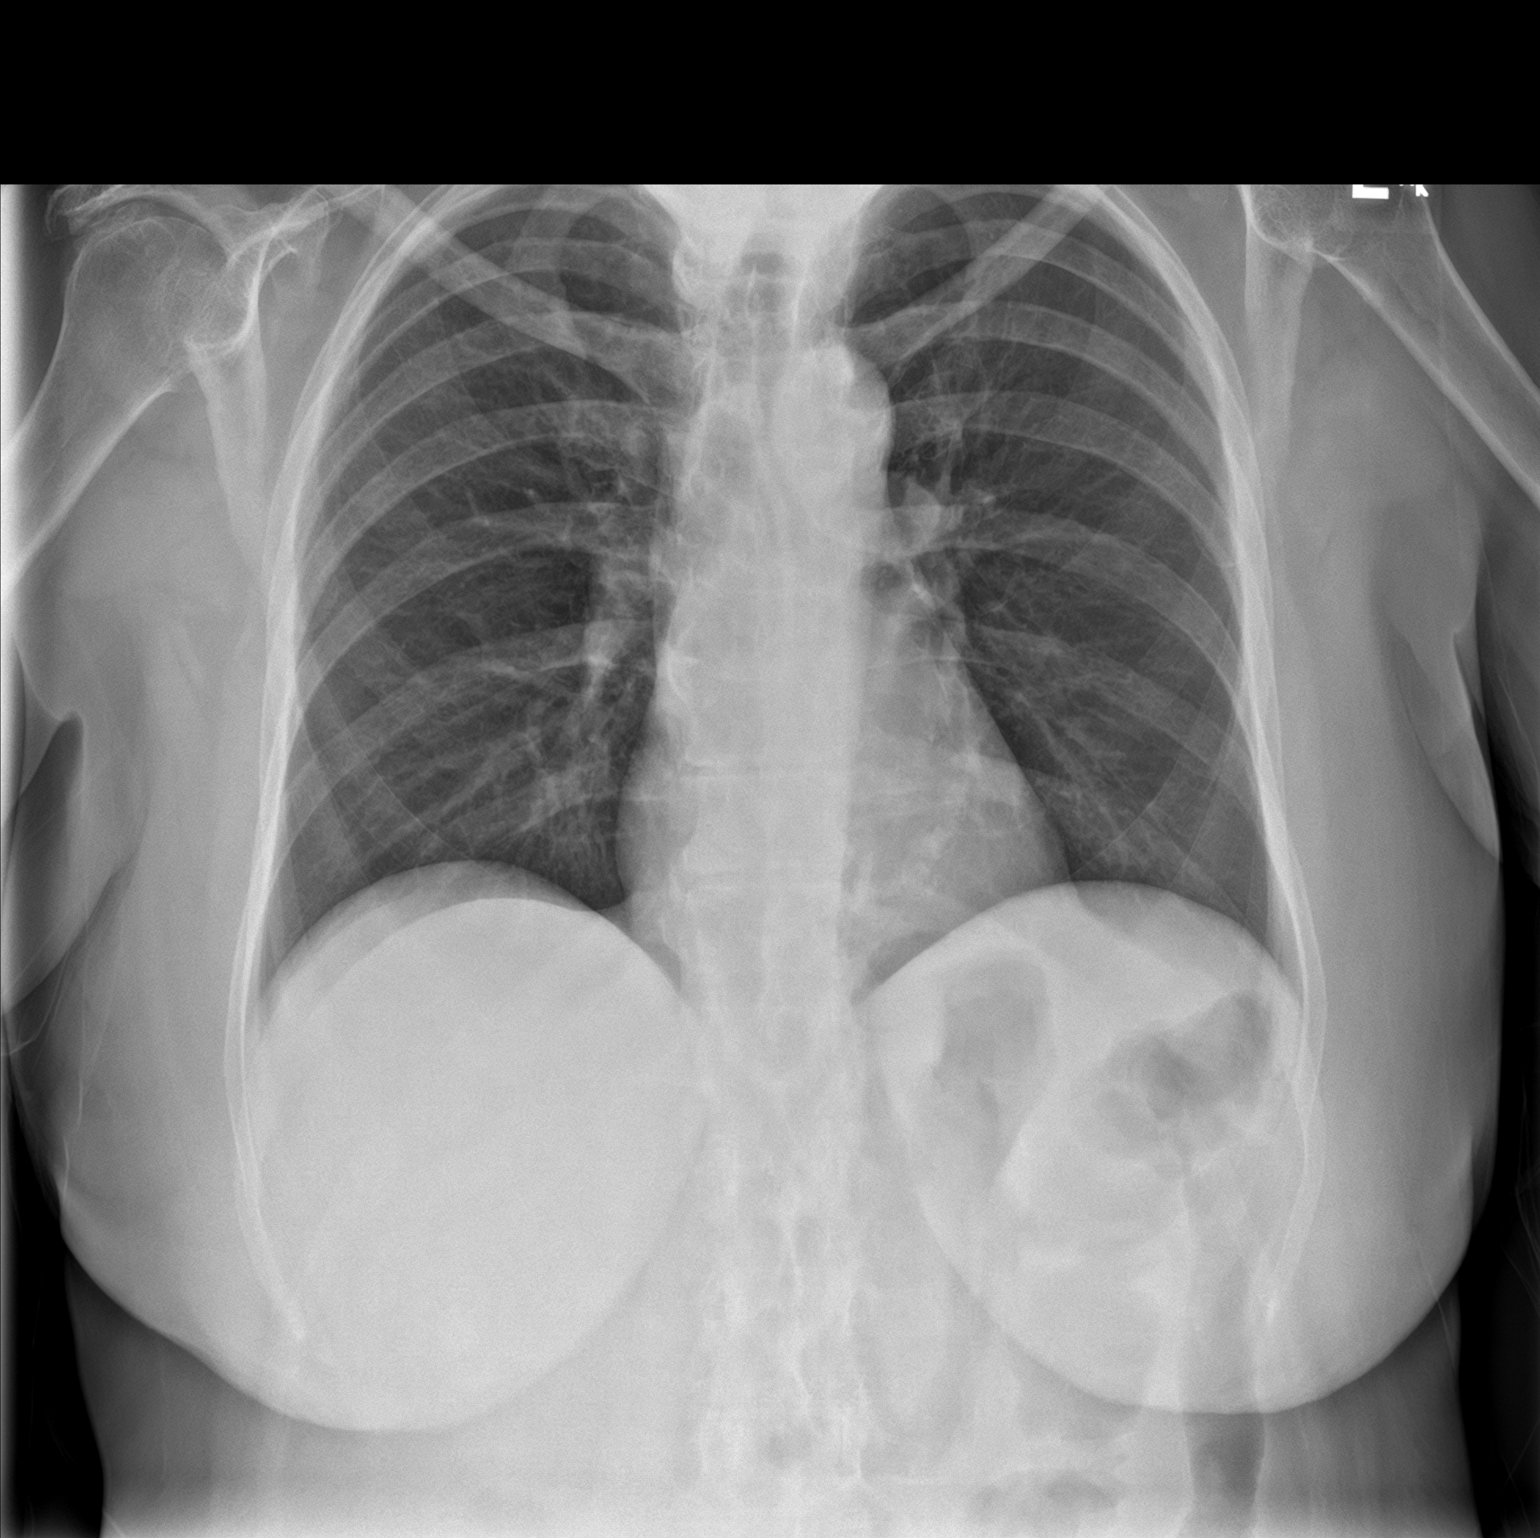

[chest lat]
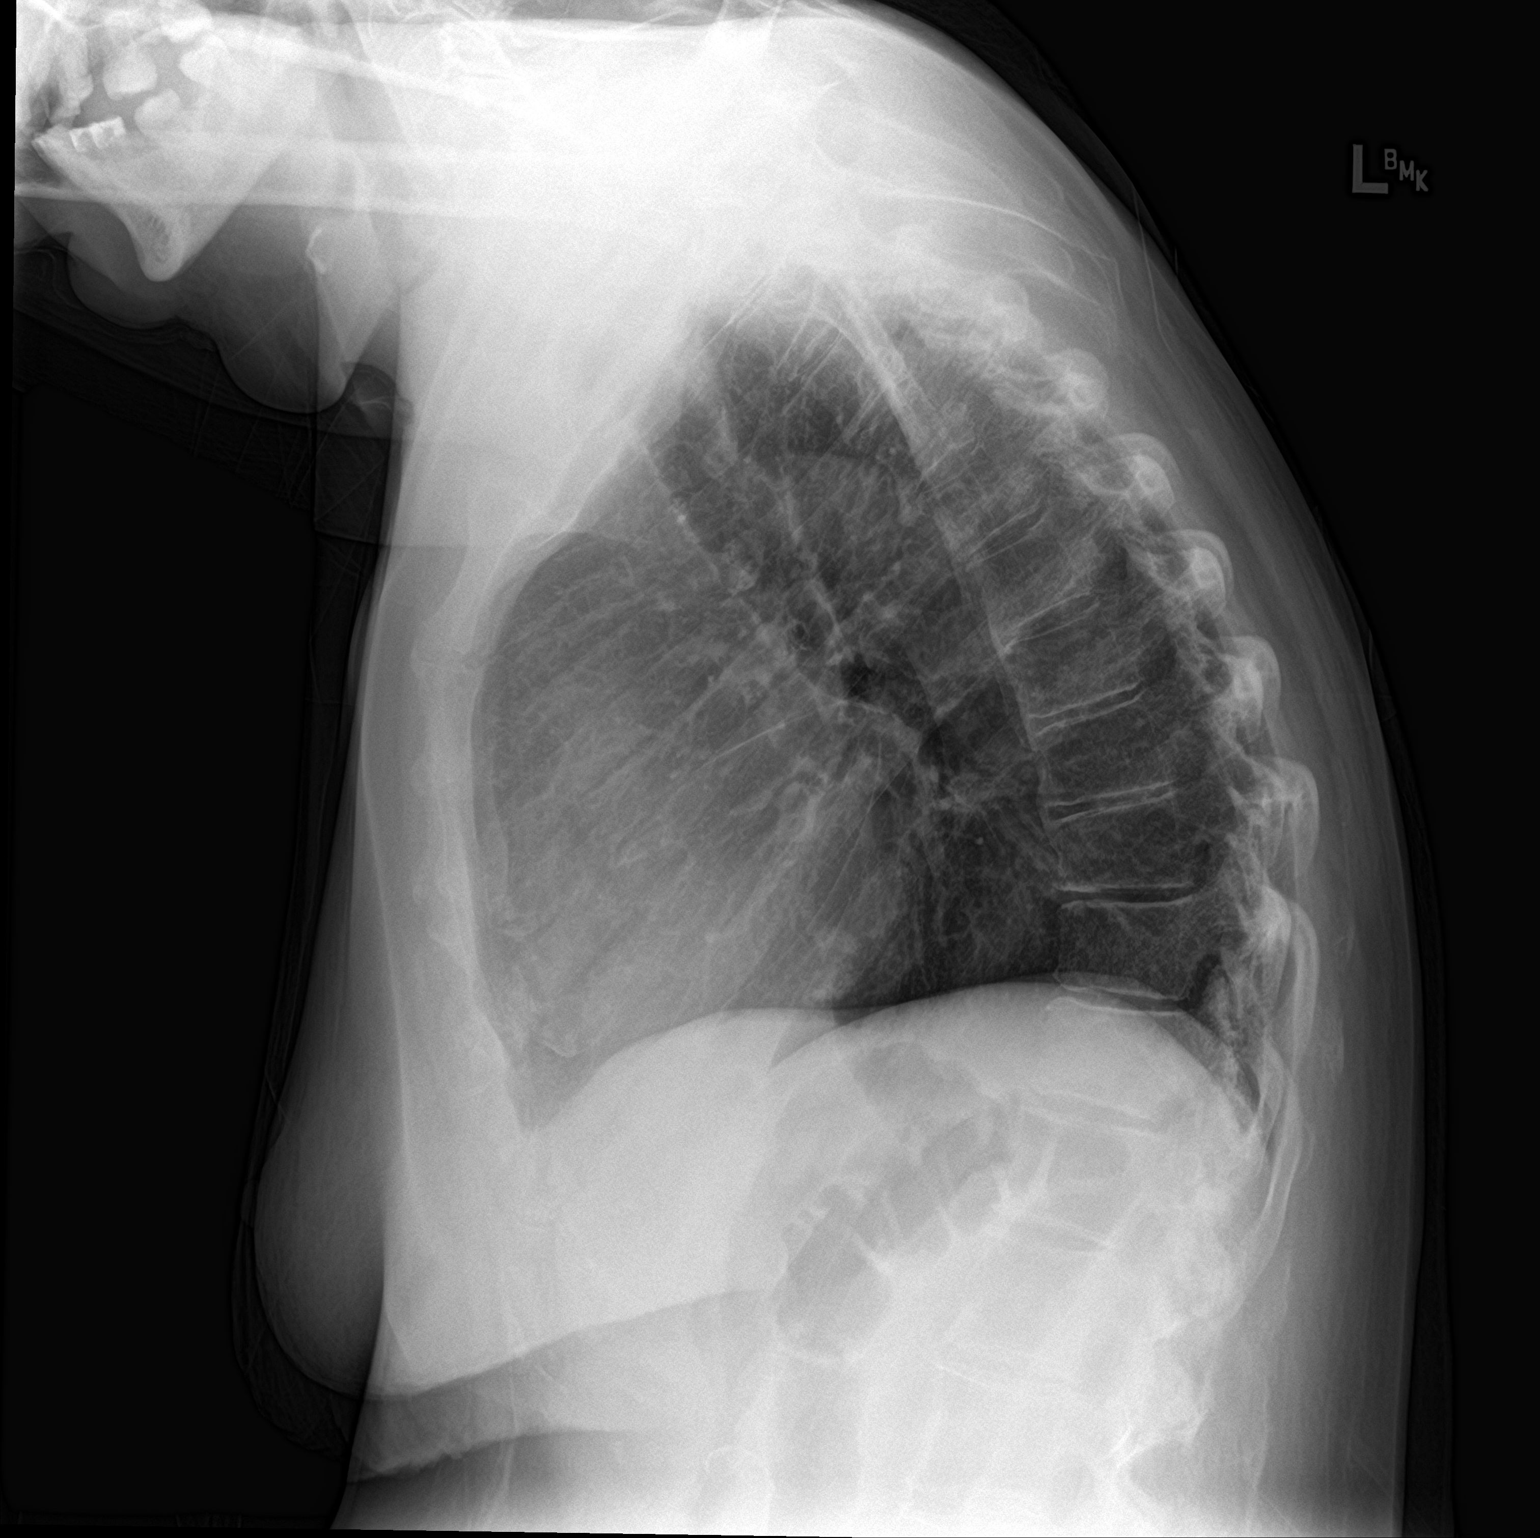

[2 of 2 positions shown; findings below may reference images not displayed]

FINDINGS: The heart size and mediastinal contours are within normal limits.
Both lungs are clear. The visualized skeletal structures are
unremarkable.
IMPRESSION: No active cardiopulmonary disease.

## 2018-10-09 MED ORDER — APIXABAN 5 MG PO TABS
5.0000 mg | ORAL_TABLET | Freq: Once | ORAL | Status: AC
  Administered 2018-10-09: 5 mg via ORAL
  Filled 2018-10-09: qty 1

## 2018-10-09 MED ORDER — APIXABAN 5 MG PO TABS: 5.0000 mg | ORAL_TABLET | Freq: Two times a day (BID) | ORAL | 2 refills | Status: DC

## 2018-10-09 MED ORDER — DILTIAZEM HCL 25 MG/5ML IV SOLN
10.0000 mg | Freq: Once | INTRAVENOUS | Status: AC
  Administered 2018-10-09: 10 mg via INTRAVENOUS
  Filled 2018-10-09: qty 5

## 2018-10-09 MED ORDER — DILTIAZEM HCL ER COATED BEADS 120 MG PO CP24: 120.0000 mg | ORAL_CAPSULE | Freq: Every day | ORAL | 11 refills | Status: DC

## 2018-10-09 MED ORDER — DILTIAZEM HCL ER COATED BEADS 120 MG PO CP24: 120.0000 mg | ORAL_CAPSULE | Freq: Once | ORAL | Status: DC

## 2018-10-09 NOTE — Progress Notes (Signed)
[facility] [place], [place] [address]  Internal MEDICINE  Office Visit Note  Patient Name: Adriana Miller  [id number]  [id number]  Date of Service: 10/10/2018  Chief Complaint  Patient presents with  . Medical Management of Chronic Issues    4 month follow up, brother have some concerns about patient, pt having issues with walking she cant walk much she tends to get weak  . Diabetes    A1C  . Fatigue    pt have no energy, loss of appetite and loss of bowel control, has been going on for about a month, pt stated she is wearing pull ups all the time, no  fever or chills, had not been in contact with anybody sick, states she have experienced some dizziness    The patient is here for follow up visit. She states that she has been having a lot of trouble walking. Gets very tired very easily. Gets out of breath. Has fallen a few times due to feeling so fatigued. States that there are some days that she is so tired she does not feel like getting out of bed or doing anything. The patient states that in the last month, she has been unable to control her bowels and bladder. Has to wear a depends at al times. She does not know of anything that happened to bring this on, but she feels as though things went downhill over the last month. She denies chest pain or chest pressure. She states that she does get short of breath and weak with exertion.       Current Medication: Outpatient Encounter Medications as of 10/09/2018  Medication Sig  . ACCU-CHEK SOFTCLIX LANCETS lancets USE TO CHECK BLOOD SUGAR TWICE DAILY.  Marland Kitchen amLODipine (NORVASC) 5 MG tablet Take 1 tablet (5 mg total) by mouth daily.  . enalapril (VASOTEC) 10 MG tablet Take 0.5 tablets (5 mg total) by mouth daily.  Marland Kitchen glucose blood test strip USE TO CHECK BS TWICE DAILY. DX 250.02   No facility-administered encounter medications on file as of 10/09/2018.     Surgical History: Past Surgical History:  Procedure  Laterality Date  . CEREBRAL ANEURYSM REPAIR      Medical History: Past Medical History:  Diagnosis Date  . Chronic kidney disease   . Diabetes mellitus without complication ([place])   . Hypertension     Family History: Family History  Problem Relation Age of Onset  . Cancer Brother     Social History   Socioeconomic History  . Marital status: Married    Spouse name: Not on file  . Number of children: Not on file  . Years of education: Not on file  . Highest education level: Not on file  Occupational History  . Not on file  Social Needs  . Financial resource strain: Not on file  . Food insecurity    Worry: Not on file    Inability: Not on file  . Transportation needs    Medical: Not on file    Non-medical: Not on file  Tobacco Use  . Smoking status: Never Smoker  . Smokeless tobacco: Current User    Types: Snuff  Substance and Sexual Activity  . Alcohol use: Yes    Alcohol/week: 7.0 standard drinks    Types: 7 Cans of beer per week    Comment: pt has cut back on alcohol use  . Drug use: No  . Sexual activity: Not on file  Lifestyle  .  Physical activity    Days per week: Not on file    Minutes per session: Not on file  . Stress: Not on file  Relationships  . Social Herbalist on phone: Not on file    Gets together: Not on file    Attends religious service: Not on file    Active member of club or organization: Not on file    Attends meetings of clubs or organizations: Not on file    Relationship status: Not on file  . Intimate partner violence    Fear of current or ex partner: Not on file    Emotionally abused: Not on file    Physically abused: Not on file    Forced sexual activity: Not on file  Other Topics Concern  . Not on file  Social History Narrative  . Not on file      Review of Systems  Constitutional: Positive for activity change, appetite change and fatigue. Negative for chills, fever and unexpected weight change.  HENT:  Negative for congestion, postnasal drip, rhinorrhea, sneezing and sore throat.   Respiratory: Positive for shortness of breath. Negative for cough and chest tightness.   Cardiovascular: Negative for chest pain and palpitations.       Blood pressure low.   Gastrointestinal: Negative for abdominal pain, constipation, diarrhea, nausea and vomiting.       Patient states that she has been incontinent with her bowels  Endocrine: Negative for cold intolerance, heat intolerance, polydipsia and polyuria.  Genitourinary: Positive for urgency. Negative for dysuria and frequency.       Patient states that she has been incontinent with her bladder.  Musculoskeletal: Negative for arthralgias, back pain, joint swelling and neck pain.  Skin: Negative for rash.  Allergic/Immunologic: Negative.   Neurological: Positive for weakness. Negative for tremors and numbness.  Hematological: Negative for adenopathy. Does not bruise/bleed easily.  Psychiatric/Behavioral: Negative for behavioral problems (Depression), sleep disturbance and suicidal ideas. The patient is not nervous/anxious.     Today's Vitals   10/09/18 1521  BP: 102/65  Pulse: 91  Resp: 16  Temp: 98.1 F (36.7 C)  SpO2: 100%  Weight: 148 lb (67.1 kg)  Height: 5\' 6"  (1.676 m)   Body mass index is 23.89 kg/m.  Physical Exam Constitutional:      General: She is not in acute distress.    Appearance: Normal appearance. She is well-developed. She is ill-appearing. She is not diaphoretic.  HENT:     Head: Normocephalic and atraumatic.     Mouth/Throat:     Pharynx: No oropharyngeal exudate.  Eyes:     Pupils: Pupils are equal, round, and reactive to light.  Neck:     Musculoskeletal: Normal range of motion and neck supple.     Thyroid: No thyromegaly.     Vascular: No JVD.     Trachea: No tracheal deviation.  Cardiovascular:     Rate and Rhythm: Normal rate. Rhythm irregular.     Heart sounds: Murmur present. No friction rub. No gallop.       Comments: ECG in the office is consistent with new onset atrial fibrillation. Pulmonary:     Effort: Pulmonary effort is normal. No respiratory distress.     Breath sounds: Normal breath sounds. No wheezing or rales.  Chest:     Chest wall: No tenderness.  Abdominal:     General: Bowel sounds are normal.     Palpations: Abdomen is soft.  Tenderness: There is no abdominal tenderness.  Musculoskeletal: Normal range of motion.     Comments: Generalized weakness is present. Using a rolling walker to help get around.   Lymphadenopathy:     Cervical: No cervical adenopathy.  Skin:    General: Skin is warm and dry.  Neurological:     Mental Status: She is alert and oriented to person, place, and time. Mental status is at baseline.     Cranial Nerves: No cranial nerve deficit.  Psychiatric:        Behavior: Behavior normal.        Thought Content: Thought content normal.        Judgment: Judgment normal.   Assessment/Plan: 1. Type 2 diabetes mellitus without complication, without long-term current use of insulin (HCC) - POCT HgB A1C 5.3 today and controlled without medication. Continue to monitor   2. Dyspnea on exertion - EKG 12-Lead positive for new onset atrial fibrillation. Recommend she be seen in ER for further evaluation and treatment. Her brother has agreed to take her to ER from the office now.   3. Atrial fibrillation, unspecified type [facility]) Patient to be seen in ER for further evaluation.   4. Fatigue, unspecified type likely related to new onset of a-fib and dehydration. She will be going to ER for further evaluation and treatment.   5. Weakness likely related to new onset of a-fib and dehydration. She will be going to ER for further evaluation and treatment.  - EKG 12-Lead  6. Diastolic dysfunction ECG showing new onset of A-fib. Sent for further evaluation and treatment in ER. Will get echocardiogram for further evaluation if not done in ER/hospital.  -  ECHOCARDIOGRAM COMPLETE; Future - EKG 12-Lead  General Counseling: pearle wandler understanding of the findings of todays visit and agrees with plan of treatment. I have discussed any further diagnostic evaluation that may be needed or ordered today. We also reviewed her medications today. she has been encouraged to call the office with any questions or concerns that should arise related to todays visit.  Cardiac risk factor modification:  1. Control blood pressure. 2. Exercise as prescribed. 3. Follow low sodium, low fat diet. and low fat and low cholestrol diet. 4. Take ASA 81mg  once a day. 5. Restricted calories diet to lose weight.  This patient was seen by Leretha Pol FNP Collaboration with Dr Lavera Guise as a part of collaborative care agreement  Orders Placed This Encounter  Procedures  . POCT HgB A1C  . EKG 12-Lead  . ECHOCARDIOGRAM COMPLETE      Time spent: 62 Minutes      Dr Lavera Guise Internal medicine

## 2018-10-09 NOTE — ED Triage Notes (Signed)
Patient sent by PCP for new onset A-fib. Reports she has no energy. Denies pain. A&O x4 in triage

## 2018-10-09 NOTE — ED Notes (Signed)
Meal tray was given.  °

## 2018-10-09 NOTE — ED Provider Notes (Signed)
[facility] Emergency Department Provider Note       Time seen: ----------------------------------------- 8:17 PM on 10/09/2018 -----------------------------------------   I have reviewed the triage vital signs and the nursing notes.  HISTORY   Chief Complaint Atrial Fibrillation    HPI Adriana Miller is a 63 y.o. female with a history of kidney disease, diabetes, hypertension, hyperlipidemia who presents to the ED for new onset atrial fibrillation.  Patient was seen by her pulmonologist today.  She has had shortness of breath and generalized fatigue that have worsened over the last month.  She denies any recent illness, was told to stop taking her amlodipine today.  Past Medical History:  Diagnosis Date  . Chronic kidney disease   . Diabetes mellitus without complication ([place])   . Hypertension     Patient Active Problem List   Diagnosis Date Noted  . Acute upper respiratory infection 09/07/2017  . Essential hypertension 09/07/2017  . Type 2 diabetes mellitus, uncontrolled ([place]) 04/05/2017  . Fatigue 04/05/2017  . Mixed hyperlipidemia 04/05/2017  . Alcohol abuse with other alcohol-induced disorder ([place]) 04/05/2017  . B12 deficiency 04/05/2017  . History of seizure 05/20/2015  . Brain aneurysm 05/20/2015    Past Surgical History:  Procedure Laterality Date  . CEREBRAL ANEURYSM REPAIR      Allergies Aspirin  Social History Social History   Tobacco Use  . Smoking status: Never Smoker  . Smokeless tobacco: Current User    Types: Snuff  Substance Use Topics  . Alcohol use: Yes    Alcohol/week: 7.0 standard drinks    Types: 7 Cans of beer per week    Comment: pt has cut back on alcohol use  . Drug use: No    Review of Systems Constitutional: Negative for fever. Cardiovascular: Negative for chest pain. Respiratory: Positive for chronic shortness of breath Gastrointestinal: Negative for abdominal pain, vomiting and  diarrhea. Musculoskeletal: Negative for back pain. Skin: Negative for rash. Neurological: Positive for weakness  All systems negative/normal/unremarkable except as stated in the HPI  ____________________________________________   PHYSICAL EXAM:  VITAL SIGNS: ED Triage Vitals  Enc Vitals Group     BP 10/09/18 1658 (!) 121/53     Pulse Rate 10/09/18 1658 (!) 115     Resp 10/09/18 1658 18     Temp 10/09/18 1658 98.4 F (36.9 C)     Temp Source 10/09/18 1658 Oral     SpO2 10/09/18 1658 98 %     Weight 10/09/18 1653 146 lb (66.2 kg)     Height 10/09/18 1653 5\' 6"  (1.676 m)     Head Circumference --      Peak Flow --      Pain Score 10/09/18 1653 0     Pain Loc --      Pain Edu? --      Excl. in [place]? --    Constitutional: Alert and oriented.  No acute distress Eyes: Conjunctivae are normal. Normal extraocular movements. ENT      Head: Normocephalic and atraumatic.      Nose: No congestion/rhinnorhea.      Mouth/Throat: Mucous membranes are moist.      Neck: No stridor. Cardiovascular: Rapid rate, irregular rhythm. No murmurs, rubs, or gallops. Respiratory: Normal respiratory effort without tachypnea nor retractions. Breath sounds are clear and equal bilaterally. No wheezes/rales/rhonchi. Gastrointestinal: Soft and nontender. Normal bowel sounds Musculoskeletal: Nontender with normal range of motion in extremities. No lower extremity tenderness nor edema. Neurologic:  Normal speech  and language. No gross focal neurologic deficits are appreciated.  Skin:  Skin is warm, dry and intact. No rash noted. Psychiatric: Mood and affect are normal. Speech and behavior are normal.  ____________________________________________  EKG: Interpreted by me.  Atrial fibrillation with rapid ventricular response with a rate of 132 bpm, LVH, normal axis, nonspecific T wave abnormalities  ____________________________________________  ED COURSE:  As part of my medical decision making, I reviewed  the following data within the [place] History obtained from family if available, nursing notes, old chart and ekg, as well as notes from prior ED visits. Patient presented for new onset atrial fibrillation, we will assess with labs and imaging as indicated at this time.   Procedures  [name] was evaluated in Emergency Department on 10/09/2018 for the symptoms described in the history of present illness. She was evaluated in the context of the global COVID-19 pandemic, which necessitated consideration that the patient might be at risk for infection with the SARS-CoV-2 virus that causes COVID-19. Institutional protocols and algorithms that pertain to the evaluation of patients at risk for COVID-19 are in a state of rapid change based on information released by regulatory bodies including the CDC and federal and state organizations. These policies and algorithms were followed during the patient's care in the ED.  ____________________________________________   LABS (pertinent positives/negatives)  Labs Reviewed  BASIC METABOLIC PANEL - Abnormal; Notable for the following components:      Result Value   Sodium 132 (*)    Potassium 3.4 (*)    CO2 21 (*)    Glucose, Bld 130 (*)    BUN 6 (*)    Calcium 8.6 (*)    All other components within normal limits  CBC - Abnormal; Notable for the following components:   RBC 3.78 (*)    Platelets 104 (*)    All other components within normal limits  TROPONIN I    RADIOLOGY Images were viewed by me  Chest x-ray Is unremarkable ____________________________________________   DIFFERENTIAL DIAGNOSIS   Atrial fibrillation, dehydration, MI, arrhythmia, electrolyte abnormality, lung disease  FINAL ASSESSMENT AND PLAN  Atrial fibrillation   Plan: The patient had presented for new onset atrial fibrillation. Patient's labs were overall reassuring. Patient's imaging not reveal any acute process.  She was given IV Cardizem  adequate rate control.  I also gave her Eliquis.  I discussed this with cardiology on call.  She will be referred to [facility] health cardiology for outpatient follow-up on 120 mg of Cardizem CD and Eliquis.   Laurence Aly, MD    Note: This note was generated in part or whole with voice recognition software. Voice recognition is usually quite accurate but there are transcription errors that can and very often do occur. I apologize for any typographical errors that were not detected and corrected.     Earleen Newport, MD 10/09/18 2106

## 2018-10-10 DIAGNOSIS — I5189 Other ill-defined heart diseases: Secondary | ICD-10-CM | POA: Insufficient documentation

## 2018-10-10 DIAGNOSIS — R0609 Other forms of dyspnea: Secondary | ICD-10-CM | POA: Insufficient documentation

## 2018-10-10 DIAGNOSIS — I4819 Other persistent atrial fibrillation: Secondary | ICD-10-CM | POA: Insufficient documentation

## 2018-10-31 ENCOUNTER — Other Ambulatory Visit: Payer: Self-pay

## 2018-10-31 DIAGNOSIS — I10 Essential (primary) hypertension: Secondary | ICD-10-CM

## 2018-10-31 MED ORDER — AMLODIPINE BESYLATE 5 MG PO TABS: 5.0000 mg | ORAL_TABLET | Freq: Every day | ORAL | 4 refills | Status: DC

## 2018-11-27 ENCOUNTER — Ambulatory Visit: Payer: Medicare Other | Admitting: Cardiovascular Disease

## 2018-12-31 NOTE — Progress Notes (Deleted)
Cardiology Office Note  Date:  12/31/2018   ID:  Adriana Miller, DOB 11-13-1955, MRN [id number]  PCP:  Ronnell Freshwater, NP   No chief complaint on file.   HPI:   DM type II CKD HTN ETOH Referred by Dr. Jimmye Norman for atrial fibrillation  referred by pulmonary to the ER Seen in the ER 10/09/18 SOB, fatigue Rate 115 bpm Started on eliquis, diltiazem ER 120 daily     PMH:   has a past medical history of Chronic kidney disease, Diabetes mellitus without complication ([place]), and Hypertension.  PSH:    Past Surgical History:  Procedure Laterality Date  . CEREBRAL ANEURYSM REPAIR      Current Outpatient Medications  Medication Sig Dispense Refill  . ACCU-CHEK SOFTCLIX LANCETS lancets USE TO CHECK BLOOD SUGAR TWICE DAILY.    Marland Kitchen amLODipine (NORVASC) 5 MG tablet Take 1 tablet (5 mg total) by mouth daily. 90 tablet 4  . apixaban (ELIQUIS) 5 MG TABS tablet Take 1 tablet (5 mg total) by mouth 2 (two) times daily. 60 tablet 2  . diltiazem (CARDIZEM CD) 120 MG 24 hr capsule Take 1 capsule (120 mg total) by mouth daily. 30 capsule 11  . enalapril (VASOTEC) 10 MG tablet Take 0.5 tablets (5 mg total) by mouth daily. 30 tablet 2  . glucose blood test strip USE TO CHECK BS TWICE DAILY. DX 250.02     No current facility-administered medications for this visit.      Allergies:   Aspirin   Social History:  The patient  reports that she has never smoked. Her smokeless tobacco use includes snuff. She reports current alcohol use of about 7.0 standard drinks of alcohol per week. She reports that she does not use drugs.   Family History:   family history includes Cancer in her brother.    Review of Systems: ROS   PHYSICAL EXAM: VS:  There were no vitals taken for this visit. , BMI There is no height or weight on file to calculate BMI. GEN: Well nourished, well developed, in no acute distress HEENT: normal Neck: no JVD, carotid bruits, or masses Cardiac: RRR; no murmurs, rubs, or  gallops,no edema  Respiratory:  clear to auscultation bilaterally, normal work of breathing GI: soft, nontender, nondistended, + BS MS: no deformity or atrophy Skin: warm and dry, no rash Neuro:  Strength and sensation are intact Psych: euthymic mood, full affect    Recent Labs: 01/27/2018: ALT 27; TSH 1.390 10/09/2018: BUN 6; Creatinine, Ser 0.89; Hemoglobin 12.0; Platelets 104; Potassium 3.4; Sodium 132    Lipid Panel Lab Results  Component Value Date   CHOL 134 01/27/2018   HDL 80 01/27/2018   LDLCALC 42 01/27/2018   TRIG 60 01/27/2018      Wt Readings from Last 3 Encounters:  10/09/18 146 lb (66.2 kg)  10/09/18 148 lb (67.1 kg)  05/18/18 164 lb 3.2 oz (74.5 kg)       ASSESSMENT AND PLAN:  Problem List Items Addressed This Visit    None       Disposition:   F/U  12 months   Total encounter time more than 25 minutes  Greater than 50% was spent in counseling and coordination of care with the patient    Signed, Esmond Plants, M.D., Ph.D. [place], [place]

## 2019-01-01 ENCOUNTER — Ambulatory Visit: Payer: Medicare Other | Admitting: Cardiovascular Disease

## 2019-01-08 ENCOUNTER — Ambulatory Visit (INDEPENDENT_AMBULATORY_CARE_PROVIDER_SITE_OTHER): Payer: Medicare Other | Admitting: Internal Medicine

## 2019-01-08 ENCOUNTER — Other Ambulatory Visit
Admission: RE | Admit: 2019-01-08 | Discharge: 2019-01-08 | Disposition: A | Discharge status: Home / Self Care | Payer: Medicare Other | Source: Ambulatory Visit | Attending: Internal Medicine | Admitting: Internal Medicine

## 2019-01-08 ENCOUNTER — Encounter: Payer: Self-pay | Admitting: Internal Medicine

## 2019-01-08 ENCOUNTER — Other Ambulatory Visit: Payer: Self-pay

## 2019-01-08 VITALS — BP 100/62 | HR 119 | Ht 66.0 in | Wt 157.0 lb

## 2019-01-08 DIAGNOSIS — I509 Heart failure, unspecified: Secondary | ICD-10-CM

## 2019-01-08 DIAGNOSIS — I4819 Other persistent atrial fibrillation: Secondary | ICD-10-CM

## 2019-01-08 DIAGNOSIS — Z79899 Other long term (current) drug therapy: Secondary | ICD-10-CM

## 2019-01-08 DIAGNOSIS — I10 Essential (primary) hypertension: Secondary | ICD-10-CM | POA: Diagnosis not present

## 2019-01-08 LAB — BASIC METABOLIC PANEL
Anion gap: 12 (ref 5–15)
BUN: 10 mg/dL (ref 8–23)
CO2: 22 mmol/L (ref 22–32)
Calcium: 8.9 mg/dL (ref 8.9–10.3)
Chloride: 101 mmol/L (ref 98–111)
Creatinine, Ser: 1.06 mg/dL — ABNORMAL HIGH (ref 0.44–1.00)
GFR calc Af Amer: >60 mL/min (ref >60)
GFR calc non Af Amer: 55 mL/min — ABNORMAL LOW (ref >60)
Glucose, Bld: 104 mg/dL — ABNORMAL HIGH (ref 70–99)
Potassium: 4.6 mmol/L (ref 3.5–5.1)
Sodium: 135 mmol/L (ref 135–145)

## 2019-01-08 LAB — TSH: TSH: 2.104 u[IU]/mL (ref 0.350–4.500)

## 2019-01-08 MED ORDER — METOPROLOL TARTRATE 25 MG PO TABS: 25.0000 mg | ORAL_TABLET | Freq: Two times a day (BID) | ORAL | 2 refills | Status: DC

## 2019-01-08 MED ORDER — FUROSEMIDE 20 MG PO TABS: 20.0000 mg | ORAL_TABLET | Freq: Every day | ORAL | 2 refills | Status: DC

## 2019-01-08 NOTE — Progress Notes (Signed)
New Outpatient Visit Date: 01/08/2019  Primary Care provider: Ronnell Freshwater, NP [address] [place],  [place] [address]  Chief Complaint: Fatigue  HPI:  Adriana Miller is a 63 y.o. female who is being seen today as a self-referral for evaluation of atrial fibrillation. She has a history of hypertension, hyperlipidemia, diabetes mellitus, chronic kidney disease, seizure disorder, and cerebral aneurysm status post repair.  She was seen by her PCP in late June, which time she complained of being exertional dyspnea and fatigue.  She also reported some bowel and bladder incontinence.  She was found to be in atrial fibrillation (new diagnosis) and was referred to the emergency department for further management.  She was placed on diltiazem and apixaban.  Today, Adriana Miller reports continued fatigue and lack of energy.  This has been present since she was seen in the ED in late June.  She reports intermittent palpitations, stating that it feels like her heart is racing.  There are no associated symptoms.  However, over the last 1-2 weeks, she has developed bilateral calf and ankle swelling as well as intermittent pain in the right groin.  She denies chest pain and lightheadedness.  She has chronic 2-3 pillow orthopnea.  Adriana Miller remains on diltiazem and apixaban.  She has not had any significant bleeding, though she has a history of intermittent falls.  She has not had any significant injuries.  She typically ambulates with a cane or walker.  She is typically good about taking her medications, though she has missed several evening doses of apixaban, most recently a few days ago.  --------------------------------------------------------------------------------------------------  Cardiovascular History & Procedures: Cardiovascular Problems:  Atrial fibrillation  Risk Factors:  Hypertension, hyperlipidemia, diabetes mellitus, tobacco use, and age greater than 22  Cath/PCI:  None  CV  Surgery:  None  EP Procedures and Devices:  None  Non-Invasive Evaluation(s):  None  Recent CV Pertinent Labs: Lab Results  Component Value Date   CHOL 134 01/27/2018   HDL 80 01/27/2018   LDLCALC 42 01/27/2018   TRIG 60 01/27/2018   K 3.4 (L) 10/09/2018   K 3.9 10/11/2013   BUN 6 (L) 10/09/2018   BUN 13 01/27/2018   BUN 13 10/11/2013   CREATININE 0.89 10/09/2018   CREATININE 0.87 10/11/2013    --------------------------------------------------------------------------------------------------  Past Medical History:  Diagnosis Date  . Cerebral aneurysm   . Chronic kidney disease   . Diabetes mellitus without complication ([place])   . Hyperlipidemia   . Hypertension   . Seizure disorder [facility])     Past Surgical History:  Procedure Laterality Date  . CEREBRAL ANEURYSM REPAIR  1991    Current Meds  Medication Sig  . ACCU-CHEK SOFTCLIX LANCETS lancets USE TO CHECK BLOOD SUGAR TWICE DAILY.  Marland Kitchen amLODipine (NORVASC) 5 MG tablet Take 1 tablet (5 mg total) by mouth daily.  Marland Kitchen apixaban (ELIQUIS) 5 MG TABS tablet Take 1 tablet (5 mg total) by mouth 2 (two) times daily.  Marland Kitchen diltiazem (CARDIZEM CD) 120 MG 24 hr capsule Take 1 capsule (120 mg total) by mouth daily.  . enalapril (VASOTEC) 10 MG tablet Take 0.5 tablets (5 mg total) by mouth daily.  Marland Kitchen glucose blood test strip USE TO CHECK BS TWICE DAILY. DX 250.02    Allergies: Aspirin  Social History   Tobacco Use  . Smoking status: Never Smoker  . Smokeless tobacco: Current User    Types: Snuff  Substance Use Topics  . Alcohol use: Yes  Alcohol/week: 14.0 standard drinks    Types: 14 Cans of beer per week    Comment: pt has cut back on alcohol use  . Drug use: No    Family History  Problem Relation Age of Onset  . Heart attack Brother 22    Review of Systems: A 12-system review of systems was performed and was negative except as noted in the HPI.   --------------------------------------------------------------------------------------------------  Physical Exam: BP 100/62 (BP Location: Right Arm, Patient Position: Sitting, Cuff Size: Normal)   Pulse (!) 119   Ht 5\' 6"  (1.676 m)   Wt 157 lb (71.2 kg)   SpO2 97%   BMI 25.34 kg/m   General:  NAD HEENT: No conjunctival pallor or scleral icterus. Moist mucous membranes. OP clear. Neck: Supple without lymphadenopathy, thyromegaly, JVD, or HJR. No carotid bruit. Lungs: Normal work of breathing. Clear to auscultation bilaterally without wheezes or crackles. Heart: Irregularly irregular without murmurs.  Non-displaced PMI. Abd: Bowel sounds present. Soft, NT/ND without hepatosplenomegaly Ext: 1+ edema to the mid calves.. Radial, PT, and DP pulses are 2+ bilaterally Skin: Warm and dry without rash. Neuro: CNIII-XII intact. Strength and fine-touch sensation intact in upper and lower extremities bilaterally. Psych: Normal mood and affect.  EKG:  Atrial fibrillation with rapid ventricular response (ventricular rate 119 bpm).  Non-specific T wave abnormality.  Lab Results  Component Value Date   WBC 4.4 10/09/2018   HGB 12.0 10/09/2018   HCT 36.7 10/09/2018   MCV 97.1 10/09/2018   PLT 104 (L) 10/09/2018    Lab Results  Component Value Date   NA 132 (L) 10/09/2018   K 3.4 (L) 10/09/2018   CL 99 10/09/2018   CO2 21 (L) 10/09/2018   BUN 6 (L) 10/09/2018   CREATININE 0.89 10/09/2018   GLUCOSE 130 (H) 10/09/2018   ALT 27 01/27/2018    Lab Results  Component Value Date   CHOL 134 01/27/2018   HDL 80 01/27/2018   LDLCALC 42 01/27/2018   TRIG 60 01/27/2018     --------------------------------------------------------------------------------------------------  ASSESSMENT AND PLAN: Persistent atrial fibrillation: I suspect persistent fatigue is due to continued atrial fibrillation with suboptimal heart rate control.  She is also developing some signs and symptoms of heart  failure, though edema could also be related to CCB use (currently on amlodipine and diltiazem).  As she has missed several doses of apixaban, we cannot proceed with DCCV alone at this time.  We have discussed optimization of medical therapy versus TEE-guided cardioversion and have agreed to the former.  I will stop diltiazem and amlodipine and start metoprolol tartrate 25 mg BID.  I reinforced the importance of taking apixaban faithfully twice daily.  We will attempt to rate control her for 4 weeks and then proceed with cardioversion.  If that proves difficult, we will need to move ahead with TEE/DCCV earlier.  I will check a BMP today, as well as TSH.  Acute heart failure: LVEF uncertain, though Adriana Miller appears mildly volume overloaded today with NYHA class III symptoms.  BP is soft in the setting of concurrent amlodipine and diltiazem use.  I will stop both agents and start metoprolol tartrate 25 mg BID.  I will also start furosemide 40 mg PO daily.  I will check a BMP today.  We will attempt to expedite previously ordered echocardiogram.  Follow-up: RTC 1 week.  Nelva Bush, MD 01/08/2019 3:50 PM

## 2019-01-08 NOTE — Patient Instructions (Addendum)
Medication Instructions:  Your physician has recommended you make the following change in your medication:  1- STOP Amlodipine. 2- STOP Diltiazem. 3- START Metoprolol tartrate 25 mg by mouth two times a day. 4- START Furosemide 20 mg by mouth ONCE A DAY. If you need a refill on your cardiac medications before your next appointment, please call your pharmacy.   Lab work: Your physician recommends that you return for lab work in: TODAY at [employer] on your way out. (BMET, TSH). - Please go to the [facility]. You will check in at the front desk to the right as you walk into the atrium. Valet Parking is offered if needed.   If you have labs (blood work) drawn today and your tests are completely normal, you will receive your results only by: Marland Kitchen MyChart Message (if you have MyChart) OR . A paper copy in the mail If you have any lab test that is abnormal or we need to change your treatment, we will call you to review the results.  Testing/Procedures: Your physician has requested that you have an echocardiogram. Echocardiography is a painless test that uses sound waves to create images of your heart. It provides your doctor with information about the size and shape of your heart and how well your heart's chambers and valves are working. This procedure takes approximately one hour. There are no restrictions for this procedure. You may get an IV, if needed, to receive an ultrasound enhancing agent through to better visualize your heart.    Follow-Up: At [facility], you and your health needs are our priority.  As part of our continuing mission to provide you with exceptional heart care, we have created designated Provider Care Teams.  These Care Teams include your primary Cardiologist (physician) and Advanced Practice Providers (APPs -  Physician Assistants and Nurse Practitioners) who all work together to provide you with the care you need, when you need it. You will need a follow up  appointment in 1 weeks with APP.    Echocardiogram An echocardiogram is a procedure that uses painless sound waves (ultrasound) to produce an image of the heart. Images from an echocardiogram can provide important information about:  Signs of coronary artery disease (CAD).  Aneurysm detection. An aneurysm is a weak or damaged part of an artery wall that bulges out from the normal force of blood pumping through the body.  Heart size and shape. Changes in the size or shape of the heart can be associated with certain conditions, including heart failure, aneurysm, and CAD.  Heart muscle function.  Heart valve function.  Signs of a past heart attack.  Fluid buildup around the heart.  Thickening of the heart muscle.  A tumor or infectious growth around the heart valves. Tell a health care provider about:  Any allergies you have.  All medicines you are taking, including vitamins, herbs, eye drops, creams, and over-the-counter medicines.  Any blood disorders you have.  Any surgeries you have had.  Any medical conditions you have.  Whether you are pregnant or may be pregnant. What are the risks? Generally, this is a safe procedure. However, problems may occur, including:  Allergic reaction to dye (contrast) that may be used during the procedure. What happens before the procedure? No specific preparation is needed. You may eat and drink normally. What happens during the procedure?   An IV tube may be inserted into one of your veins.  You may receive contrast through this tube. A  contrast is an injection that improves the quality of the pictures from your heart.  A gel will be applied to your chest.  A wand-like tool (transducer) will be moved over your chest. The gel will help to transmit the sound waves from the transducer.  The sound waves will harmlessly bounce off of your heart to allow the heart images to be captured in real-time motion. The images will be recorded on  a computer. The procedure may vary among health care providers and hospitals. What happens after the procedure?  You may return to your normal, everyday life, including diet, activities, and medicines, unless your health care provider tells you not to do that. Summary  An echocardiogram is a procedure that uses painless sound waves (ultrasound) to produce an image of the heart.  Images from an echocardiogram can provide important information about the size and shape of your heart, heart muscle function, heart valve function, and fluid buildup around your heart.  You do not need to do anything to prepare before this procedure. You may eat and drink normally.  After the echocardiogram is completed, you may return to your normal, everyday life, unless your health care provider tells you not to do that. This information is not intended to replace advice given to you by your health care provider. Make sure you discuss any questions you have with your health care provider. Document Released: 04/02/2000 Document Revised: 07/27/2018 Document Reviewed: 05/08/2016 Elsevier Patient Education  2020 [employer].

## 2019-01-09 ENCOUNTER — Encounter: Payer: Self-pay | Admitting: Internal Medicine

## 2019-01-09 ENCOUNTER — Other Ambulatory Visit: Payer: Self-pay | Admitting: Internal Medicine

## 2019-01-09 DIAGNOSIS — I4819 Other persistent atrial fibrillation: Secondary | ICD-10-CM

## 2019-01-15 ENCOUNTER — Other Ambulatory Visit
Admission: RE | Admit: 2019-01-15 | Discharge: 2019-01-15 | Disposition: A | Discharge status: Home / Self Care | Payer: Medicare Other | Source: Ambulatory Visit | Attending: Internal Medicine | Admitting: Internal Medicine

## 2019-01-15 ENCOUNTER — Ambulatory Visit (INDEPENDENT_AMBULATORY_CARE_PROVIDER_SITE_OTHER): Payer: Medicare Other | Admitting: Internal Medicine

## 2019-01-15 ENCOUNTER — Other Ambulatory Visit: Payer: Self-pay

## 2019-01-15 ENCOUNTER — Encounter: Payer: Self-pay | Admitting: Internal Medicine

## 2019-01-15 ENCOUNTER — Ambulatory Visit (INDEPENDENT_AMBULATORY_CARE_PROVIDER_SITE_OTHER): Payer: Medicare Other

## 2019-01-15 VITALS — BP 110/60 | HR 61 | Ht 66.0 in | Wt 157.0 lb

## 2019-01-15 DIAGNOSIS — I5032 Chronic diastolic (congestive) heart failure: Secondary | ICD-10-CM | POA: Insufficient documentation

## 2019-01-15 DIAGNOSIS — R9431 Abnormal electrocardiogram [ECG] [EKG]: Secondary | ICD-10-CM | POA: Diagnosis not present

## 2019-01-15 DIAGNOSIS — I5033 Acute on chronic diastolic (congestive) heart failure: Secondary | ICD-10-CM | POA: Diagnosis not present

## 2019-01-15 DIAGNOSIS — I4819 Other persistent atrial fibrillation: Secondary | ICD-10-CM

## 2019-01-15 LAB — BASIC METABOLIC PANEL
Anion gap: 14 (ref 5–15)
BUN: 11 mg/dL (ref 8–23)
CO2: 27 mmol/L (ref 22–32)
Calcium: 8.9 mg/dL (ref 8.9–10.3)
Chloride: 98 mmol/L (ref 98–111)
Creatinine, Ser: 0.86 mg/dL (ref 0.44–1.00)
GFR calc Af Amer: >60 mL/min (ref >60)
GFR calc non Af Amer: >60 mL/min (ref >60)
Glucose, Bld: 118 mg/dL — ABNORMAL HIGH (ref 70–99)
Potassium: 4 mmol/L (ref 3.5–5.1)
Sodium: 139 mmol/L (ref 135–145)

## 2019-01-15 LAB — MAGNESIUM: Magnesium: 1.7 mg/dL (ref 1.7–2.4)

## 2019-01-15 NOTE — Patient Instructions (Signed)
Medication Instructions:  Your physician recommends that you continue on your current medications as directed. Please refer to the Current Medication list given to you today.  If you need a refill on your cardiac medications before your next appointment, please call your pharmacy.   Lab work: Your physician recommends that you return for lab work in: TODAY - BMP, Mg.  If you have labs (blood work) drawn today and your tests are completely normal, you will receive your results only by: Marland Kitchen MyChart Message (if you have MyChart) OR . A paper copy in the mail If you have any lab test that is abnormal or we need to change your treatment, we will call you to review the results.  Testing/Procedures: NONE  Follow-Up: At [facility], you and your health needs are our priority.  As part of our continuing mission to provide you with exceptional heart care, we have created designated Provider Care Teams.  These Care Teams include your primary Cardiologist (physician) and Advanced Practice Providers (APPs -  Physician Assistants and Nurse Practitioners) who all work together to provide you with the care you need, when you need it. You will need a follow up appointment in 1 months with APP.   You may see . or one of the following Advanced Practice Providers on your designated Care Team:   Murray Hodgkins, NP Christell Faith, PA-C . Marrianne Mood, PA-C

## 2019-01-15 NOTE — Progress Notes (Signed)
Follow-up Outpatient Visit Date: 01/15/2019  Primary Care Provider: Ronnell Freshwater, NP [address] [place] [address]  Chief Complaint: Follow-up atrial fibrillation  HPI:  Adriana Miller is a 63 y.o. year-old female with history of persistent atrial fibrillation, hypertension, hyperlipidemia, diabetes mellitus, chronic kidney disease, seizure disorder, and cerebral aneurysm status post repair, who presents for follow-up of atrial fibrillation.  I saw her a week ago, at which time Adriana Miller complained of fatigue with low energy ever since her ED visit in 09/2018 when she was diagnosed with atrial fibrillation.  She also noted increasing leg swelling over the last 1 to 2 weeks.  She was on diltiazem and apixaban at that time, though she reported having missed at least a few doses of apixaban over the last few months (including within the last week).  She was noted to be in atrial fibrillation with ventricular rate of 119 bpm.  We agreed to discontinue amlodipine and diltiazem and to start metoprolol tartrate 25 mg twice daily and furosemide 20 mg daily.  I counseled Adriana Miller about the importance of compliance with her medications, including anticoagulation.  Echocardiogram performed today showed normal LVEF.  Today, Adriana Miller reports improved lower extremity edema, though it has not completely resolved.  Her energy has also gotten better.  She denies chest pain, palpitations, and lightheadedness.  Exertional dyspnea is better.  She denies orthopnea, and PND.  She reports being compliant with her medications, including apixaban.  She has not had any bleeding or falls.  --------------------------------------------------------------------------------------------------  Cardiovascular History & Procedures: Cardiovascular Problems:  Atrial fibrillation  Risk Factors:  Hypertension, hyperlipidemia, diabetes mellitus, tobacco use, and age greater than 6  Cath/PCI:  None  CV  Surgery:  None  EP Procedures and Devices:  None  Non-Invasive Evaluation(s):  TTE (01/15/2019): Normal LV size and wall thickness.  LVEF 55-60%.  Normal RV size and function.  No significant valvular abnormality.  Recent CV Pertinent Labs: Lab Results  Component Value Date   CHOL 134 01/27/2018   HDL 80 01/27/2018   LDLCALC 42 01/27/2018   TRIG 60 01/27/2018   K 4.0 01/15/2019   K 3.9 10/11/2013   MG 1.7 01/15/2019   BUN 11 01/15/2019   BUN 13 01/27/2018   BUN 13 10/11/2013   CREATININE 0.86 01/15/2019   CREATININE 0.87 10/11/2013    Past medical and surgical history were reviewed and updated in EPIC.  Current Meds  Medication Sig  . ACCU-CHEK SOFTCLIX LANCETS lancets USE TO CHECK BLOOD SUGAR TWICE DAILY.  Marland Kitchen apixaban (ELIQUIS) 5 MG TABS tablet Take 1 tablet (5 mg total) by mouth 2 (two) times daily.  . enalapril (VASOTEC) 10 MG tablet Take 0.5 tablets (5 mg total) by mouth daily.  . furosemide (LASIX) 20 MG tablet Take 1 tablet (20 mg total) by mouth daily.  Marland Kitchen glucose blood test strip USE TO CHECK BS TWICE DAILY. DX 250.02  . metoprolol tartrate (LOPRESSOR) 25 MG tablet Take 1 tablet (25 mg total) by mouth 2 (two) times daily.    Allergies: Aspirin  Social History   Tobacco Use  . Smoking status: Never Smoker  . Smokeless tobacco: Current User    Types: Snuff  Substance Use Topics  . Alcohol use: Yes    Alcohol/week: 14.0 standard drinks    Types: 14 Cans of beer per week    Comment: pt has cut back on alcohol use  . Drug use: No    Family History  Problem  Relation Age of Onset  . Heart attack Brother 40    Review of Systems: A 12-system review of systems was performed and was negative except as noted in the HPI.  --------------------------------------------------------------------------------------------------  Physical Exam: BP 110/60 (BP Location: Left Arm, Patient Position: Sitting, Cuff Size: Normal)   Pulse 61   Ht 5\' 6"  (1.676 m)   Wt  157 lb (71.2 kg)   BMI 25.34 kg/m   General:  NAD HEENT: No conjunctival pallor or scleral icterus. Moist mucous membranes.  OP clear. Neck: Supple without lymphadenopathy, thyromegaly, JVD, or HJR. No carotid bruit. Lungs: Normal work of breathing. Clear to auscultation bilaterally without wheezes or crackles. Heart: Regular rate and rhythm without murmurs, rubs, or gallops. Non-displaced PMI. Abd: Bowel sounds present. Soft, NT/ND without hepatosplenomegaly Ext: Trace to 1+ calf edema.  Skin: Warm and dry without rash.  EKG:  NSR with mild qt prolongation (QTc 481 Adriana).  Otherwise, no significant abnormality.  Lab Results  Component Value Date   WBC 4.4 10/09/2018   HGB 12.0 10/09/2018   HCT 36.7 10/09/2018   MCV 97.1 10/09/2018   PLT 104 (L) 10/09/2018    Lab Results  Component Value Date   NA 139 01/15/2019   K 4.0 01/15/2019   CL 98 01/15/2019   CO2 27 01/15/2019   BUN 11 01/15/2019   CREATININE 0.86 01/15/2019   GLUCOSE 118 (H) 01/15/2019   ALT 27 01/27/2018    Lab Results  Component Value Date   CHOL 134 01/27/2018   HDL 80 01/27/2018   LDLCALC 42 01/27/2018   TRIG 60 01/27/2018    --------------------------------------------------------------------------------------------------  ASSESSMENT AND PLAN: Persistent atrial fibrillation: Patient has converted to sinus rhythm since last week.  We will continue current doses of metoprolol and apixaban.  Acute on chronic HFpEF: Volume status improved but still not optimized.  Echo today shows normal LVEF.  Continue metoprolol and current dose of furosemide.  I will check a BMP today to ensure stable renal function and electrolytes.  QT prolongation: Mild on EKG today.  I will check a BMP and magnesium level.  The patient is not on any QT-prolonging medications; these should be avoided.  Hypertension: BP normal.  Continue current doses of enalapril and metoprolol.  Follow-up: RTC 1 month.  Nelva Bush, MD  01/15/2019 9:27 PM

## 2019-01-16 ENCOUNTER — Other Ambulatory Visit: Payer: Self-pay

## 2019-01-16 ENCOUNTER — Telehealth: Payer: Self-pay | Admitting: *Deleted

## 2019-01-16 NOTE — Telephone Encounter (Signed)
Results called to pt. Pt verbalized understanding.  

## 2019-01-16 NOTE — Telephone Encounter (Signed)
-----   Message from Nelva Bush, MD sent at 01/15/2019  5:40 PM EDT ----- No significant abnormality noted.  Ms. Cantrell should continue her current medications, as discussed at today's office visit.

## 2019-01-17 ENCOUNTER — Other Ambulatory Visit: Payer: Self-pay

## 2019-01-17 ENCOUNTER — Telehealth: Payer: Self-pay | Admitting: Nurse Practitioner

## 2019-01-17 MED ORDER — APIXABAN 5 MG PO TABS: 5.0000 mg | ORAL_TABLET | Freq: Two times a day (BID) | ORAL | 6 refills | Status: DC

## 2019-01-17 NOTE — Telephone Encounter (Signed)
Please review for refill. Thanks!  

## 2019-01-17 NOTE — Telephone Encounter (Signed)
Pt advised on calling cardiologist on medication refills

## 2019-01-17 NOTE — Telephone Encounter (Signed)
Pt's age 63, wt 71.2 kg, SCr 0.86, CrCl 73.3, last ov w/ Dr. Saunders Revel 01/15/19.

## 2019-01-17 NOTE — Telephone Encounter (Signed)
Refill Request.  

## 2019-01-17 NOTE — Telephone Encounter (Signed)
Looks like she is seeing cardiologist since ER. She sees Dr. Saunders Revel.

## 2019-01-17 NOTE — Telephone Encounter (Signed)
*  STAT* If patient is at the pharmacy, call can be transferred to refill team.   1. Which medications need to be refilled? (please list name of each medication and dose if known) Eliquis  2. Which pharmacy/location (including street and city if local pharmacy) is medication to be sent to? [facility]  3. Do they need a 30 day or 90 day supply? 90  

## 2019-01-18 ENCOUNTER — Ambulatory Visit: Payer: Medicare Other | Admitting: Nurse Practitioner

## 2019-01-22 ENCOUNTER — Other Ambulatory Visit: Payer: Self-pay

## 2019-01-22 DIAGNOSIS — I10 Essential (primary) hypertension: Secondary | ICD-10-CM

## 2019-01-22 MED ORDER — ENALAPRIL MALEATE 10 MG PO TABS: 5.0000 mg | ORAL_TABLET | Freq: Every day | ORAL | 2 refills | Status: DC

## 2019-01-24 ENCOUNTER — Ambulatory Visit: Payer: Medicare Other | Admitting: Internal Medicine

## 2019-02-05 ENCOUNTER — Ambulatory Visit: Payer: Self-pay | Admitting: Adult Health

## 2019-02-07 ENCOUNTER — Ambulatory Visit: Payer: Self-pay | Admitting: Adult Health

## 2019-02-08 ENCOUNTER — Inpatient Hospital Stay
Admission: EM | Admit: 2019-02-08 | Discharge: 2019-02-13 | DRG: 378 | Disposition: A | Discharge status: Home Health | Payer: Medicare Other | Attending: Internal Medicine | Admitting: Internal Medicine

## 2019-02-08 ENCOUNTER — Emergency Department: Payer: Medicare Other

## 2019-02-08 ENCOUNTER — Other Ambulatory Visit: Payer: Self-pay

## 2019-02-08 DIAGNOSIS — K0889 Other specified disorders of teeth and supporting structures: Secondary | ICD-10-CM | POA: Diagnosis present

## 2019-02-08 DIAGNOSIS — F1010 Alcohol abuse, uncomplicated: Secondary | ICD-10-CM | POA: Diagnosis present

## 2019-02-08 DIAGNOSIS — K529 Noninfective gastroenteritis and colitis, unspecified: Secondary | ICD-10-CM | POA: Diagnosis not present

## 2019-02-08 DIAGNOSIS — D6959 Other secondary thrombocytopenia: Secondary | ICD-10-CM | POA: Diagnosis not present

## 2019-02-08 DIAGNOSIS — K635 Polyp of colon: Secondary | ICD-10-CM | POA: Diagnosis present

## 2019-02-08 DIAGNOSIS — Z8249 Family history of ischemic heart disease and other diseases of the circulatory system: Secondary | ICD-10-CM

## 2019-02-08 DIAGNOSIS — L03116 Cellulitis of left lower limb: Secondary | ICD-10-CM | POA: Diagnosis present

## 2019-02-08 DIAGNOSIS — M19072 Primary osteoarthritis, left ankle and foot: Secondary | ICD-10-CM | POA: Diagnosis not present

## 2019-02-08 DIAGNOSIS — D124 Benign neoplasm of descending colon: Secondary | ICD-10-CM | POA: Diagnosis not present

## 2019-02-08 DIAGNOSIS — I5032 Chronic diastolic (congestive) heart failure: Secondary | ICD-10-CM | POA: Diagnosis not present

## 2019-02-08 DIAGNOSIS — D61818 Other pancytopenia: Secondary | ICD-10-CM | POA: Diagnosis not present

## 2019-02-08 DIAGNOSIS — K766 Portal hypertension: Secondary | ICD-10-CM | POA: Diagnosis present

## 2019-02-08 DIAGNOSIS — K648 Other hemorrhoids: Secondary | ICD-10-CM | POA: Diagnosis not present

## 2019-02-08 DIAGNOSIS — Z20828 Contact with and (suspected) exposure to other viral communicable diseases: Secondary | ICD-10-CM | POA: Diagnosis present

## 2019-02-08 DIAGNOSIS — I4819 Other persistent atrial fibrillation: Secondary | ICD-10-CM | POA: Diagnosis not present

## 2019-02-08 DIAGNOSIS — R531 Weakness: Secondary | ICD-10-CM | POA: Diagnosis not present

## 2019-02-08 DIAGNOSIS — K644 Residual hemorrhoidal skin tags: Secondary | ICD-10-CM | POA: Diagnosis not present

## 2019-02-08 DIAGNOSIS — K7689 Other specified diseases of liver: Secondary | ICD-10-CM | POA: Diagnosis not present

## 2019-02-08 DIAGNOSIS — I10 Essential (primary) hypertension: Secondary | ICD-10-CM | POA: Diagnosis not present

## 2019-02-08 DIAGNOSIS — E1122 Type 2 diabetes mellitus with diabetic chronic kidney disease: Secondary | ICD-10-CM | POA: Diagnosis present

## 2019-02-08 DIAGNOSIS — K259 Gastric ulcer, unspecified as acute or chronic, without hemorrhage or perforation: Secondary | ICD-10-CM | POA: Diagnosis not present

## 2019-02-08 DIAGNOSIS — K254 Chronic or unspecified gastric ulcer with hemorrhage: Secondary | ICD-10-CM | POA: Diagnosis not present

## 2019-02-08 DIAGNOSIS — D696 Thrombocytopenia, unspecified: Secondary | ICD-10-CM | POA: Diagnosis not present

## 2019-02-08 DIAGNOSIS — I130 Hypertensive heart and chronic kidney disease with heart failure and stage 1 through stage 4 chronic kidney disease, or unspecified chronic kidney disease: Secondary | ICD-10-CM | POA: Diagnosis not present

## 2019-02-08 DIAGNOSIS — K0381 Cracked tooth: Secondary | ICD-10-CM | POA: Diagnosis present

## 2019-02-08 DIAGNOSIS — N183 Chronic kidney disease, stage 3 unspecified: Secondary | ICD-10-CM | POA: Diagnosis present

## 2019-02-08 DIAGNOSIS — K3189 Other diseases of stomach and duodenum: Secondary | ICD-10-CM | POA: Diagnosis present

## 2019-02-08 DIAGNOSIS — K7460 Unspecified cirrhosis of liver: Secondary | ICD-10-CM | POA: Diagnosis not present

## 2019-02-08 DIAGNOSIS — Z886 Allergy status to analgesic agent status: Secondary | ICD-10-CM

## 2019-02-08 DIAGNOSIS — K7030 Alcoholic cirrhosis of liver without ascites: Secondary | ICD-10-CM | POA: Diagnosis present

## 2019-02-08 DIAGNOSIS — R5381 Other malaise: Secondary | ICD-10-CM | POA: Diagnosis not present

## 2019-02-08 DIAGNOSIS — Z7901 Long term (current) use of anticoagulants: Secondary | ICD-10-CM

## 2019-02-08 DIAGNOSIS — L0390 Cellulitis, unspecified: Secondary | ICD-10-CM | POA: Diagnosis not present

## 2019-02-08 DIAGNOSIS — L03119 Cellulitis of unspecified part of limb: Secondary | ICD-10-CM

## 2019-02-08 DIAGNOSIS — K08409 Partial loss of teeth, unspecified cause, unspecified class: Secondary | ICD-10-CM | POA: Diagnosis present

## 2019-02-08 DIAGNOSIS — K922 Gastrointestinal hemorrhage, unspecified: Secondary | ICD-10-CM | POA: Diagnosis not present

## 2019-02-08 DIAGNOSIS — K824 Cholesterolosis of gallbladder: Secondary | ICD-10-CM | POA: Diagnosis not present

## 2019-02-08 DIAGNOSIS — K828 Other specified diseases of gallbladder: Secondary | ICD-10-CM | POA: Diagnosis not present

## 2019-02-08 DIAGNOSIS — D649 Anemia, unspecified: Secondary | ICD-10-CM | POA: Diagnosis not present

## 2019-02-08 DIAGNOSIS — R52 Pain, unspecified: Secondary | ICD-10-CM | POA: Diagnosis not present

## 2019-02-08 DIAGNOSIS — G40909 Epilepsy, unspecified, not intractable, without status epilepticus: Secondary | ICD-10-CM | POA: Diagnosis present

## 2019-02-08 DIAGNOSIS — K449 Diaphragmatic hernia without obstruction or gangrene: Secondary | ICD-10-CM | POA: Diagnosis present

## 2019-02-08 DIAGNOSIS — Z79899 Other long term (current) drug therapy: Secondary | ICD-10-CM

## 2019-02-08 DIAGNOSIS — F17290 Nicotine dependence, other tobacco product, uncomplicated: Secondary | ICD-10-CM | POA: Diagnosis present

## 2019-02-08 DIAGNOSIS — E876 Hypokalemia: Secondary | ICD-10-CM | POA: Diagnosis not present

## 2019-02-08 DIAGNOSIS — K649 Unspecified hemorrhoids: Secondary | ICD-10-CM | POA: Diagnosis not present

## 2019-02-08 DIAGNOSIS — K921 Melena: Secondary | ICD-10-CM | POA: Diagnosis not present

## 2019-02-08 DIAGNOSIS — R197 Diarrhea, unspecified: Secondary | ICD-10-CM | POA: Diagnosis not present

## 2019-02-08 DIAGNOSIS — I4891 Unspecified atrial fibrillation: Secondary | ICD-10-CM | POA: Diagnosis not present

## 2019-02-08 DIAGNOSIS — D122 Benign neoplasm of ascending colon: Secondary | ICD-10-CM | POA: Diagnosis not present

## 2019-02-08 DIAGNOSIS — Z8679 Personal history of other diseases of the circulatory system: Secondary | ICD-10-CM

## 2019-02-08 HISTORY — DX: Cellulitis of unspecified part of limb: L03.119

## 2019-02-08 LAB — CBC
HCT: 30.4 % — ABNORMAL LOW (ref 36.0–46.0)
Hemoglobin: 10.3 g/dL — ABNORMAL LOW (ref 12.0–15.0)
MCH: 33 pg (ref 26.0–34.0)
MCHC: 33.9 g/dL (ref 30.0–36.0)
MCV: 97.4 fL (ref 80.0–100.0)
Platelets: 36 10*3/uL — ABNORMAL LOW (ref 150–400)
RBC: 3.12 MIL/uL — ABNORMAL LOW (ref 3.87–5.11)
RDW: 14 % (ref 11.5–15.5)
WBC: 5.4 10*3/uL (ref 4.0–10.5)
nRBC: 0 % (ref 0.0–0.2)

## 2019-02-08 LAB — CBC WITH DIFFERENTIAL/PLATELET
Abs Immature Granulocytes: 0.04 10*3/uL (ref 0.00–0.07)
Basophils Absolute: 0 10*3/uL (ref 0.0–0.1)
Basophils Relative: 0 %
Eosinophils Absolute: 0 10*3/uL (ref 0.0–0.5)
Eosinophils Relative: 0 %
HCT: 34.8 % — ABNORMAL LOW (ref 36.0–46.0)
Hemoglobin: 11.7 g/dL — ABNORMAL LOW (ref 12.0–15.0)
Immature Granulocytes: 1 %
Lymphocytes Relative: 8 %
Lymphs Abs: 0.5 10*3/uL — ABNORMAL LOW (ref 0.7–4.0)
MCH: 33 pg (ref 26.0–34.0)
MCHC: 33.6 g/dL (ref 30.0–36.0)
MCV: 98 fL (ref 80.0–100.0)
Monocytes Absolute: 0.5 10*3/uL (ref 0.1–1.0)
Monocytes Relative: 9 %
Neutro Abs: 4.6 10*3/uL (ref 1.7–7.7)
Neutrophils Relative %: 82 %
Platelets: 43 10*3/uL — ABNORMAL LOW (ref 150–400)
RBC: 3.55 MIL/uL — ABNORMAL LOW (ref 3.87–5.11)
RDW: 13.9 % (ref 11.5–15.5)
WBC: 5.6 10*3/uL (ref 4.0–10.5)
nRBC: 0 % (ref 0.0–0.2)

## 2019-02-08 LAB — LIPASE, BLOOD: Lipase: 36 U/L (ref 11–51)

## 2019-02-08 LAB — COMPREHENSIVE METABOLIC PANEL
ALT: 25 U/L (ref 0–44)
AST: 72 U/L — ABNORMAL HIGH (ref 15–41)
Albumin: 2.9 g/dL — ABNORMAL LOW (ref 3.5–5.0)
Alkaline Phosphatase: 50 U/L (ref 38–126)
Anion gap: 20 — ABNORMAL HIGH (ref 5–15)
BUN: 8 mg/dL (ref 8–23)
CO2: 22 mmol/L (ref 22–32)
Calcium: 7.6 mg/dL — ABNORMAL LOW (ref 8.9–10.3)
Chloride: 91 mmol/L — ABNORMAL LOW (ref 98–111)
Creatinine, Ser: 1.1 mg/dL — ABNORMAL HIGH (ref 0.44–1.00)
GFR calc Af Amer: >60 mL/min (ref >60)
GFR calc non Af Amer: 53 mL/min — ABNORMAL LOW (ref >60)
Glucose, Bld: 108 mg/dL — ABNORMAL HIGH (ref 70–99)
Potassium: 2.9 mmol/L — ABNORMAL LOW (ref 3.5–5.1)
Sodium: 133 mmol/L — ABNORMAL LOW (ref 135–145)
Total Bilirubin: 3.5 mg/dL — ABNORMAL HIGH (ref 0.3–1.2)
Total Protein: 8.6 g/dL — ABNORMAL HIGH (ref 6.5–8.1)

## 2019-02-08 LAB — PROTIME-INR
INR: 2.6 — ABNORMAL HIGH (ref 0.8–1.2)
Prothrombin Time: 27.2 seconds — ABNORMAL HIGH (ref 11.4–15.2)

## 2019-02-08 LAB — MAGNESIUM: Magnesium: 1.1 mg/dL — ABNORMAL LOW (ref 1.7–2.4)

## 2019-02-08 LAB — ETHANOL: Alcohol, Ethyl (B): <10 mg/dL (ref <10)

## 2019-02-08 IMAGING — CR DG FOOT 2V*L*
2 series · 2 of 2 positions shown · non-contrast
Comparison: None.

CLINICAL DATA: Pain and swelling in the left foot for 2 months.

EXAM:
LEFT FOOT - 2 VIEW

[foot ap]
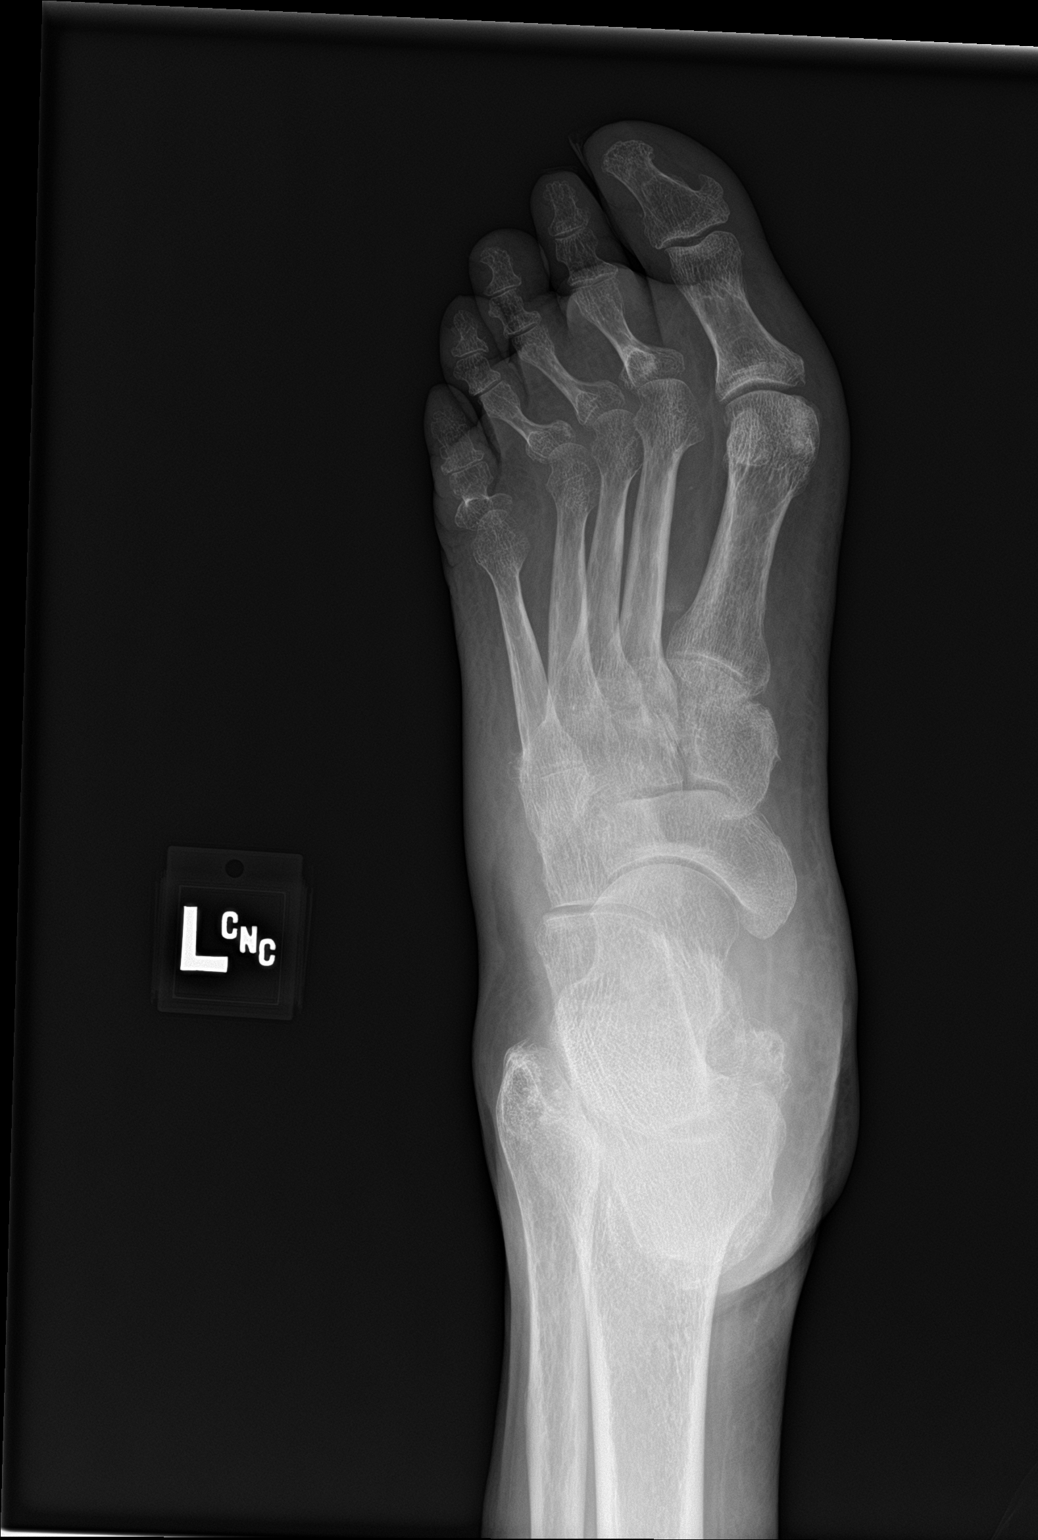

[foot lat]
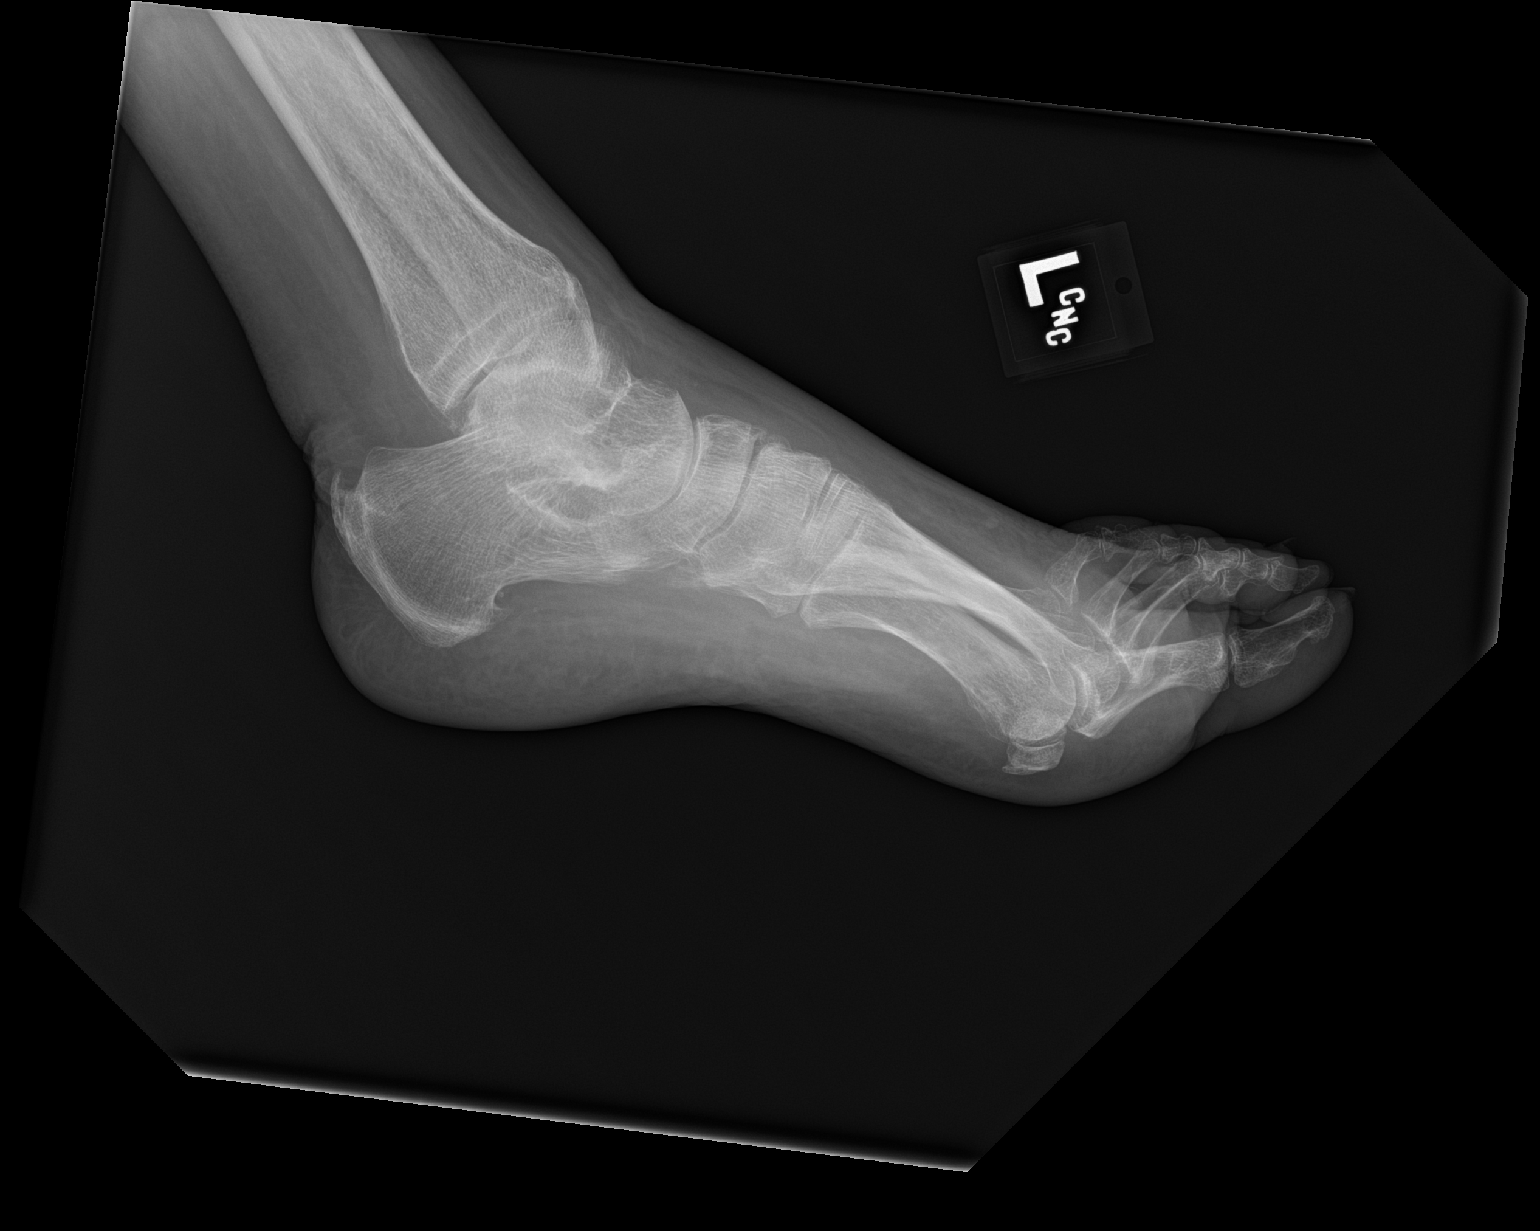

[2 of 2 positions shown; findings below may reference images not displayed]

FINDINGS: Mild midfoot and hindfoot degenerative changes but no acute fracture
or destructive bony changes. Calcaneal spurring changes are noted.
Mild hallux valgus deformity noted.
IMPRESSION: Mild degenerative changes but no acute bony findings.

## 2019-02-08 IMAGING — CR DG CHEST 2V
2 series · 2 of 2 positions shown · non-contrast
Comparison: Chest x-ray [DATE]

CLINICAL DATA: Weakness and diarrhea.

EXAM:
CHEST - 2 VIEW

[chest lat]
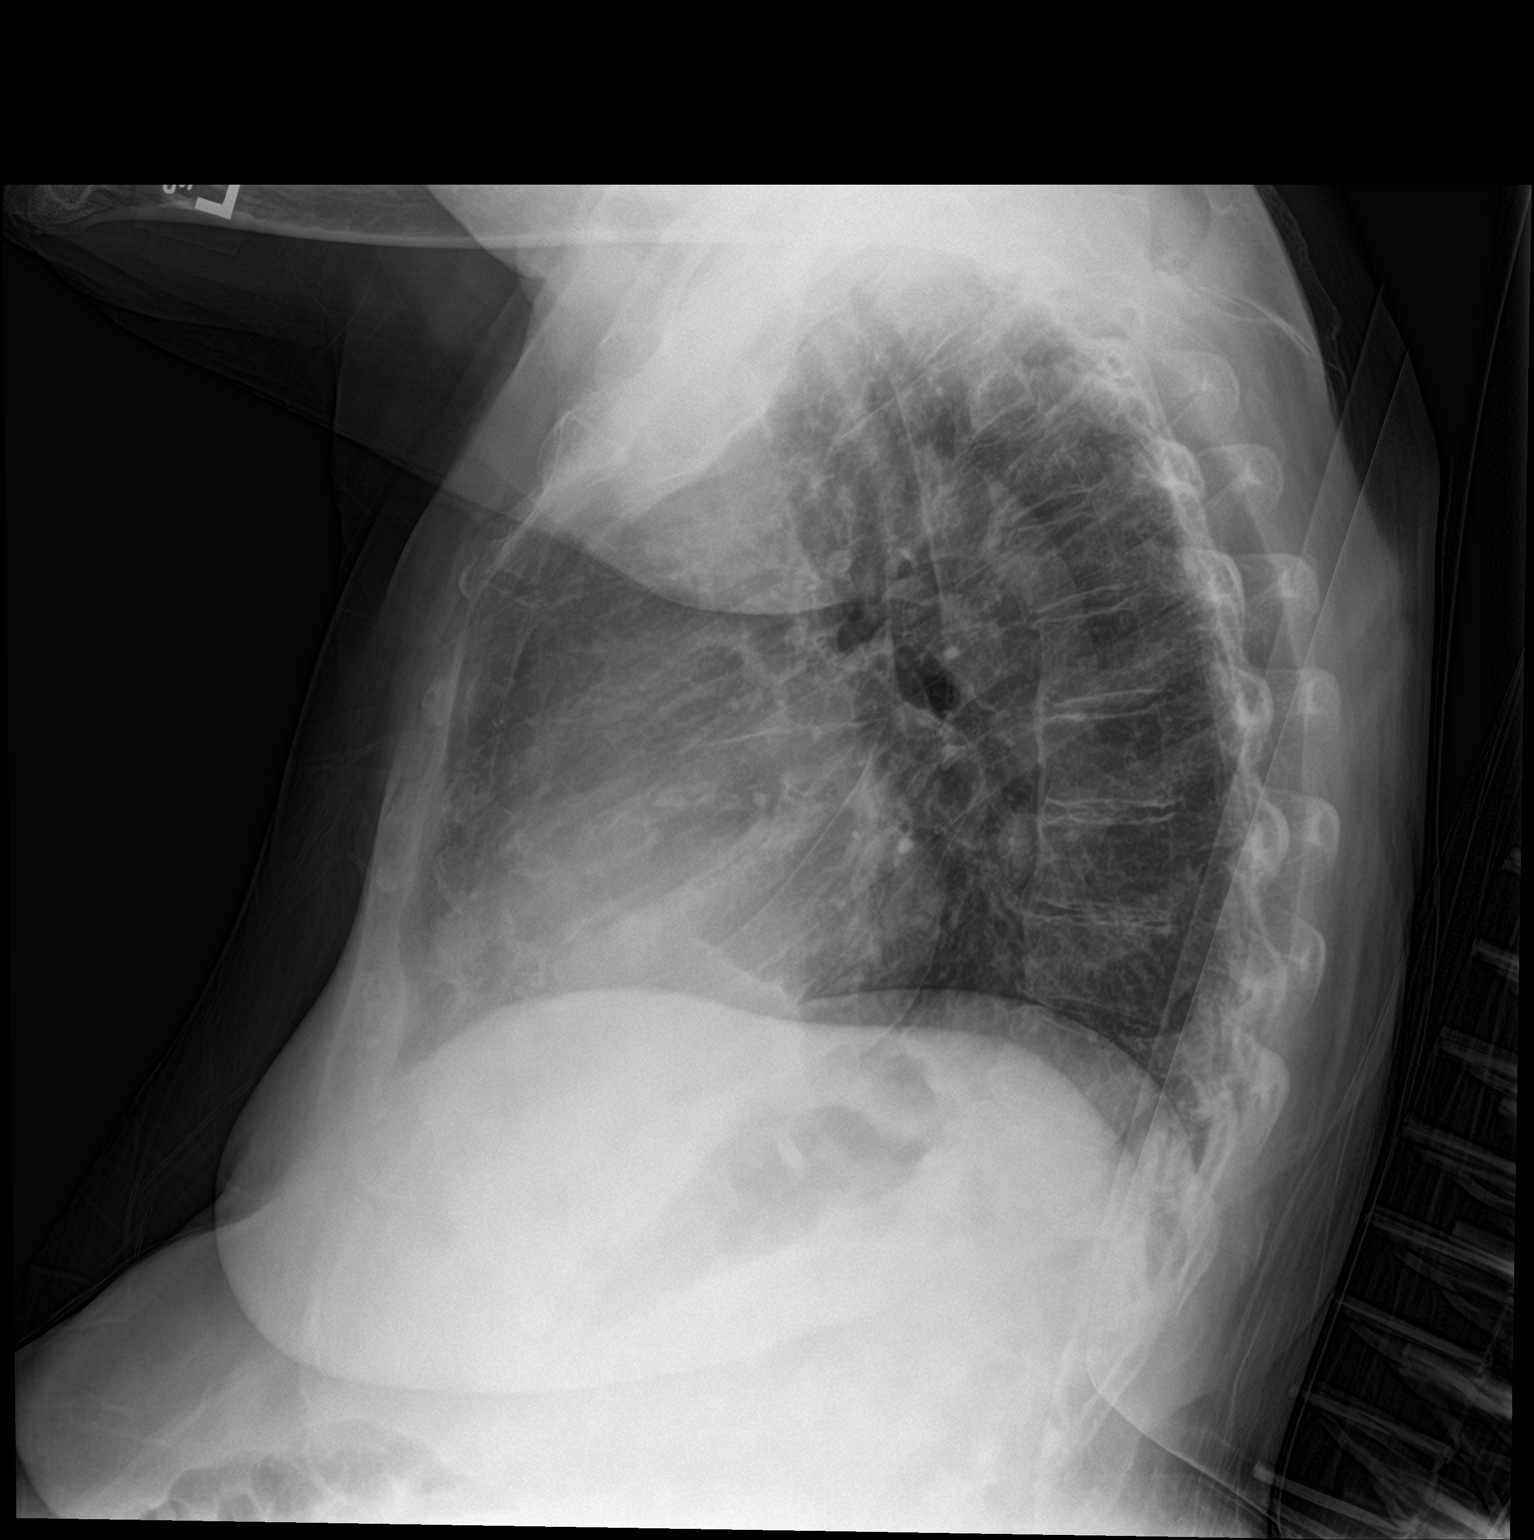

[chest ap]
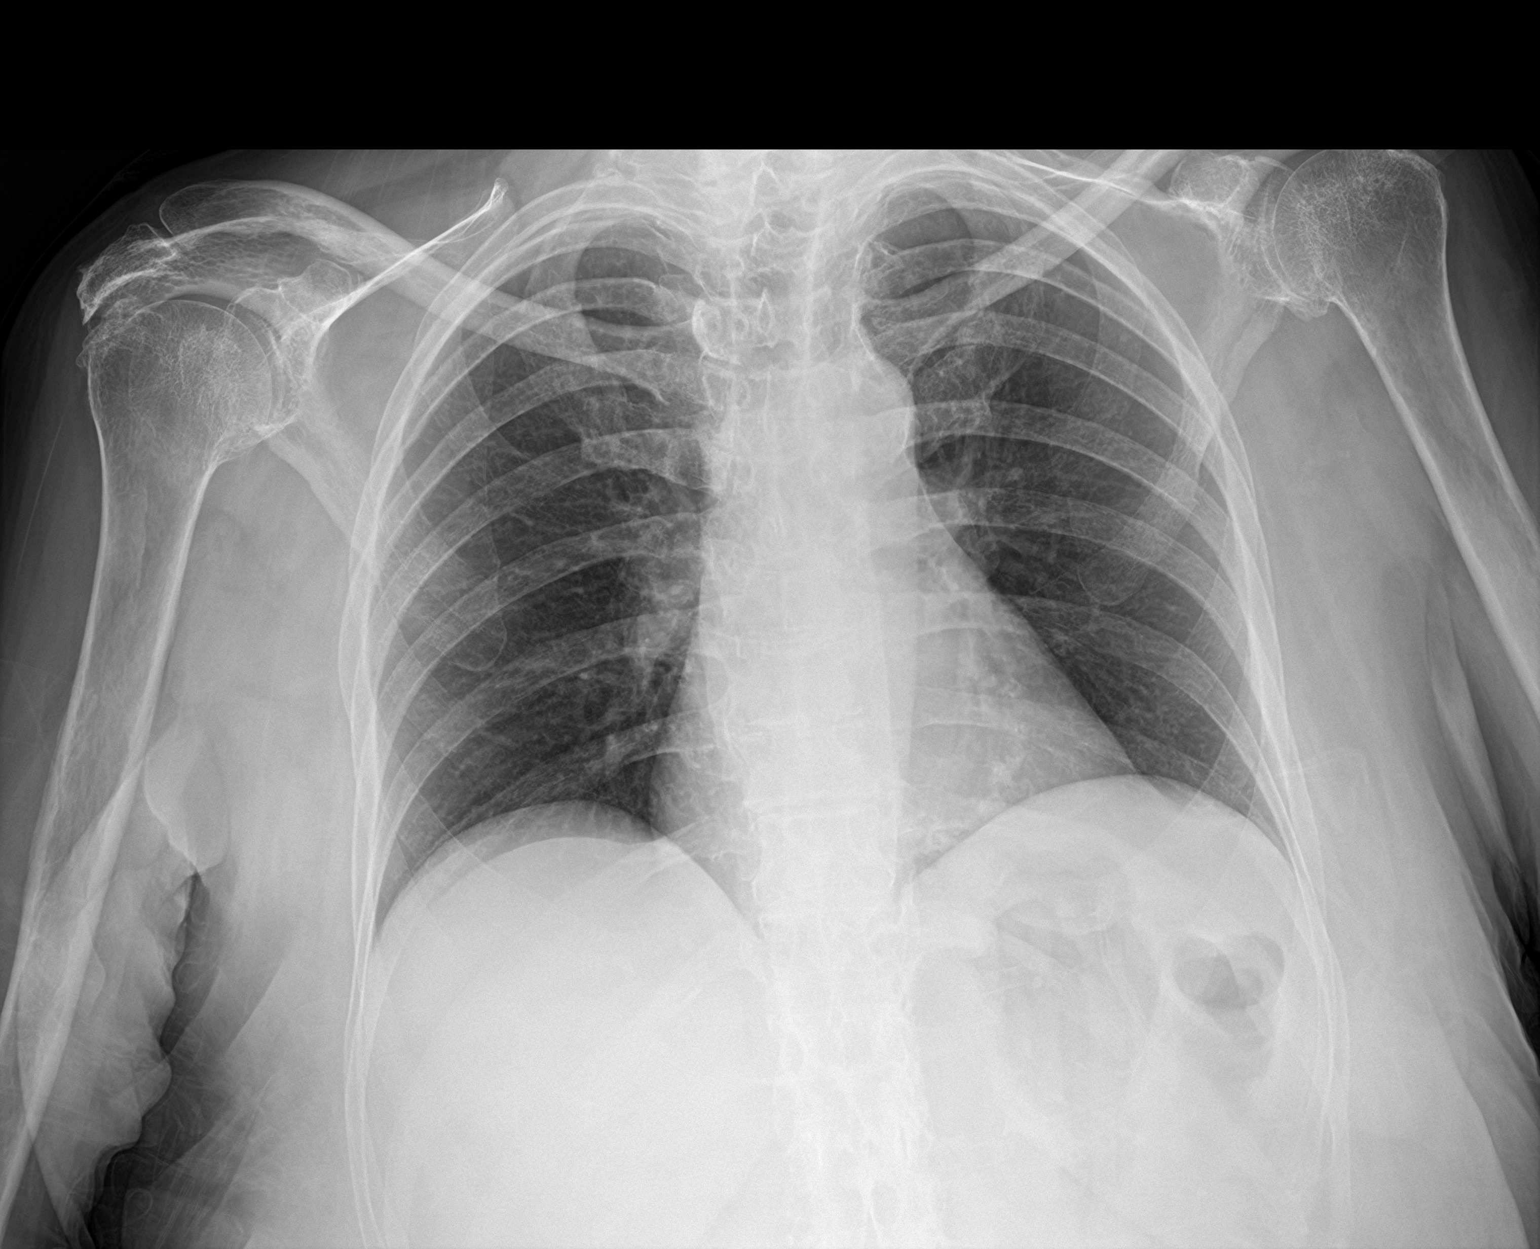

[2 of 2 positions shown; findings below may reference images not displayed]

FINDINGS: The cardiac silhouette, mediastinal and hilar contours are within
normal limits and stable. The lungs are clear. No pleural effusion.
No pneumothorax. No worrisome pulmonary lesions. The bony thorax is
intact. Stable degenerative changes involving both shoulders.
IMPRESSION: No acute cardiopulmonary findings.

## 2019-02-08 IMAGING — US US ABDOMEN LIMITED
1 series · 14 of 25 positions shown · non-contrast
Comparison: None.

CLINICAL DATA: Hyperbilirubinemia.

EXAM:
ULTRASOUND ABDOMEN LIMITED RIGHT UPPER QUADRANT

[Series 1: us abdomen limited · 14 of 54 slices shown]
[im 1/54]
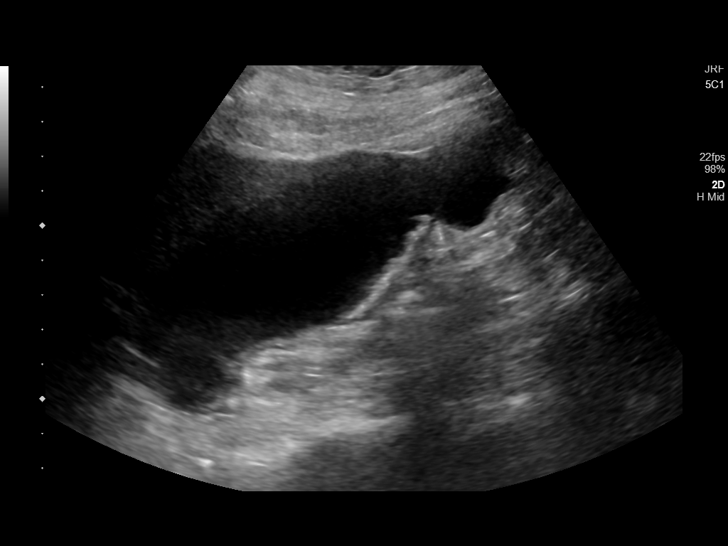
[im 5/54]
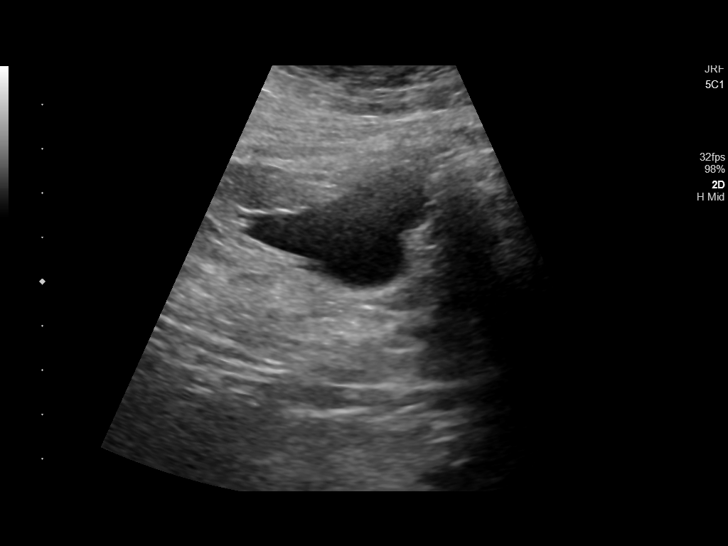
[im 9/54]
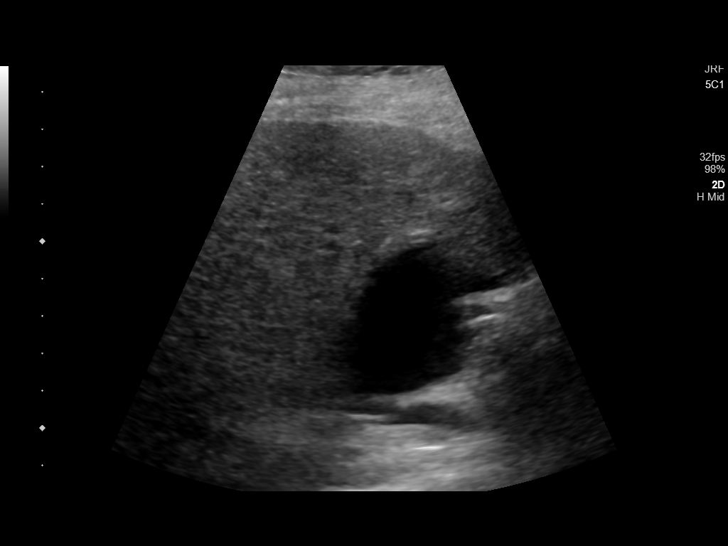
[im 14/54]
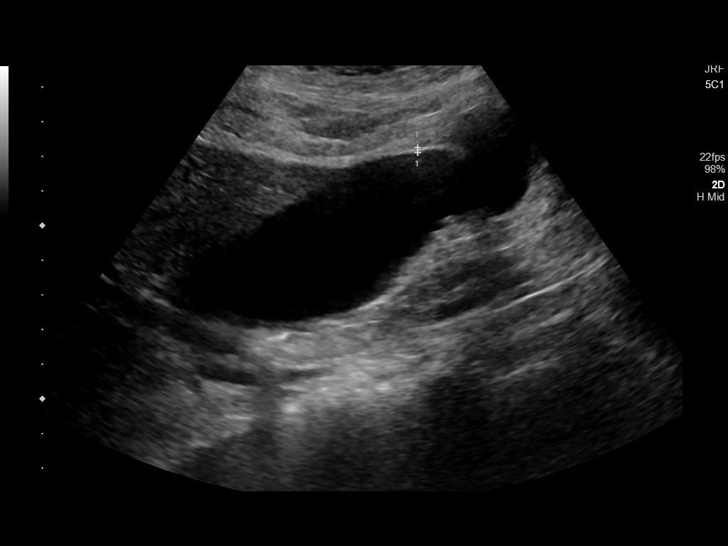
[im 18/54]
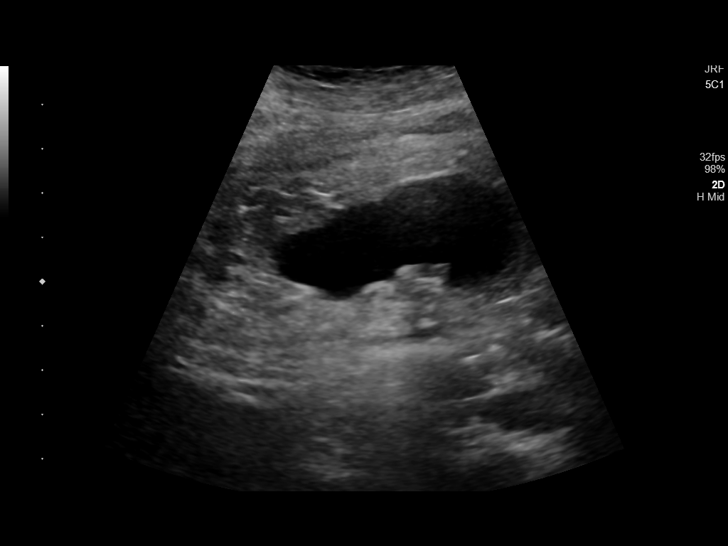
[im 20/54]
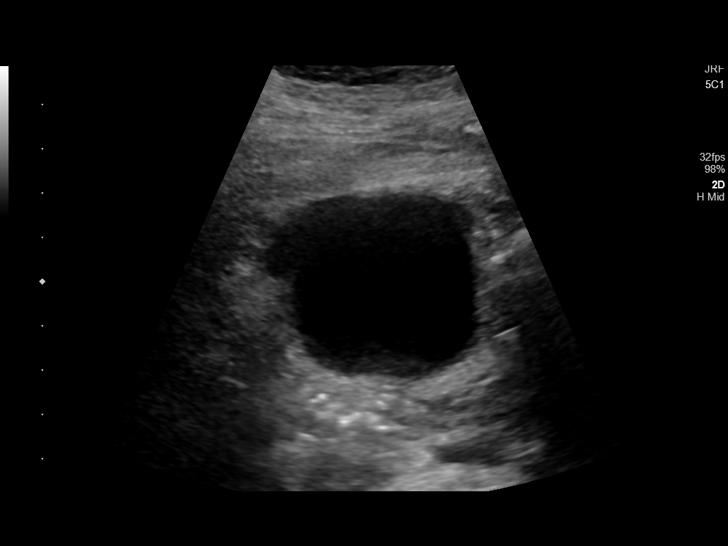
[im 25/54]
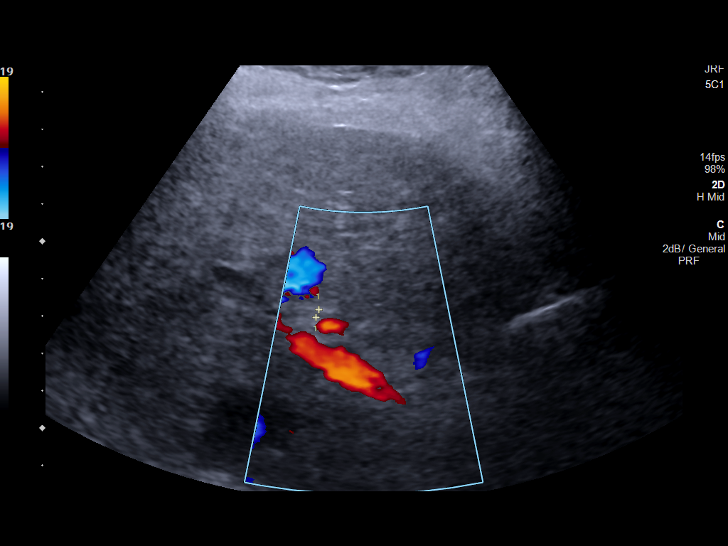
[im 29/54]
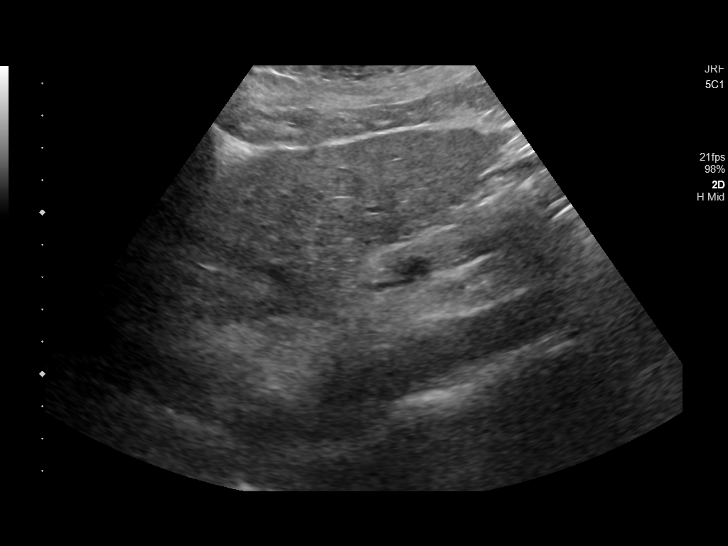
[im 34/54]
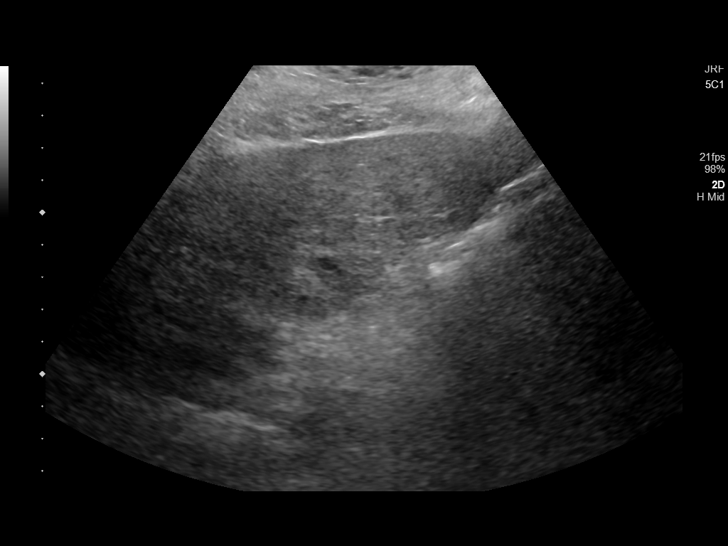
[im 36/54]
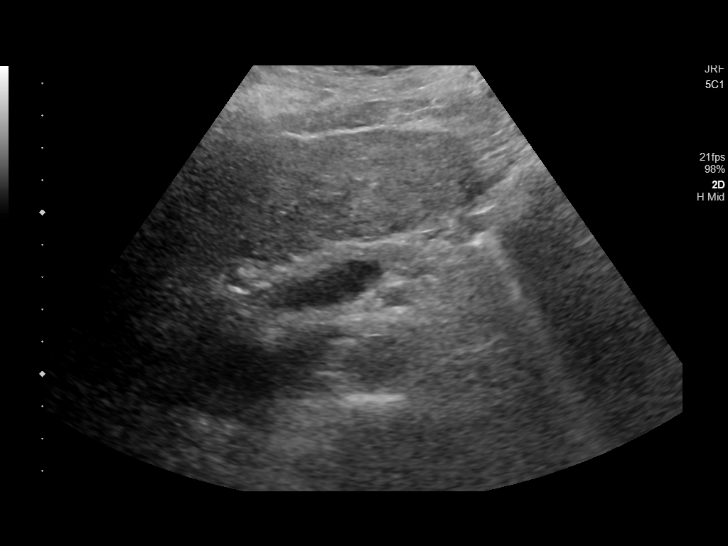
[im 40/54]
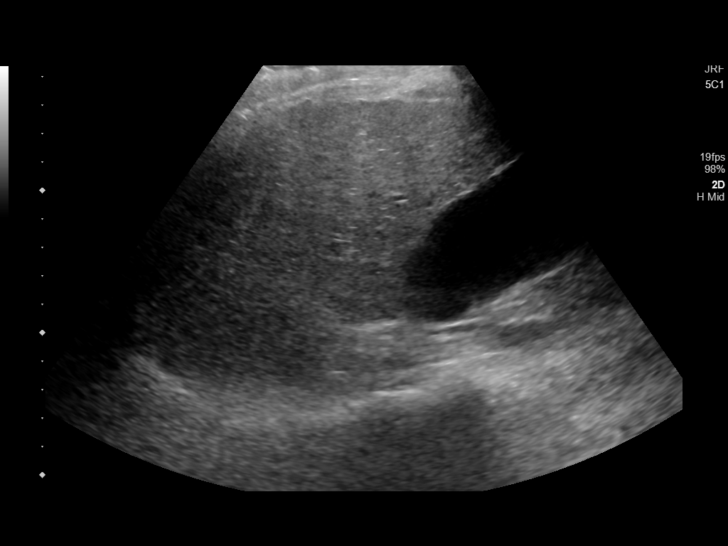
[im 45/54]
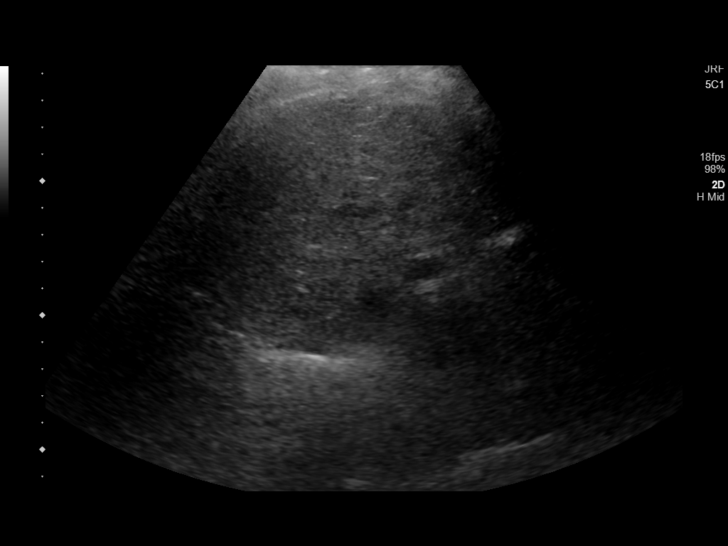
[im 49/54]
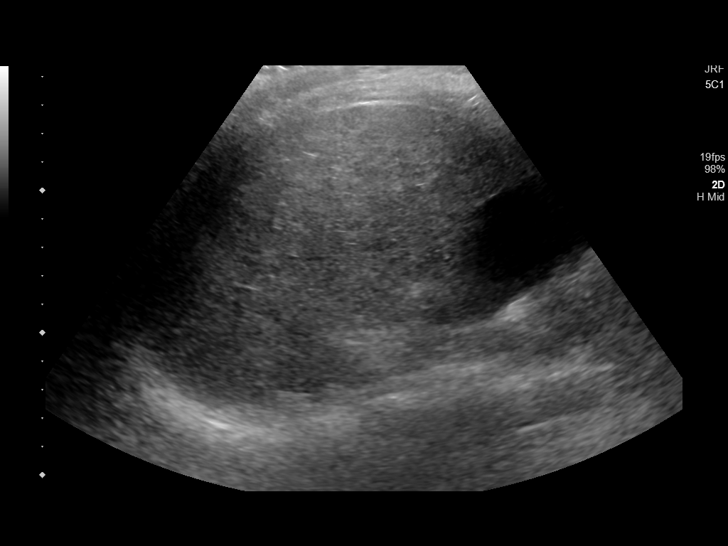
[im 54/54]
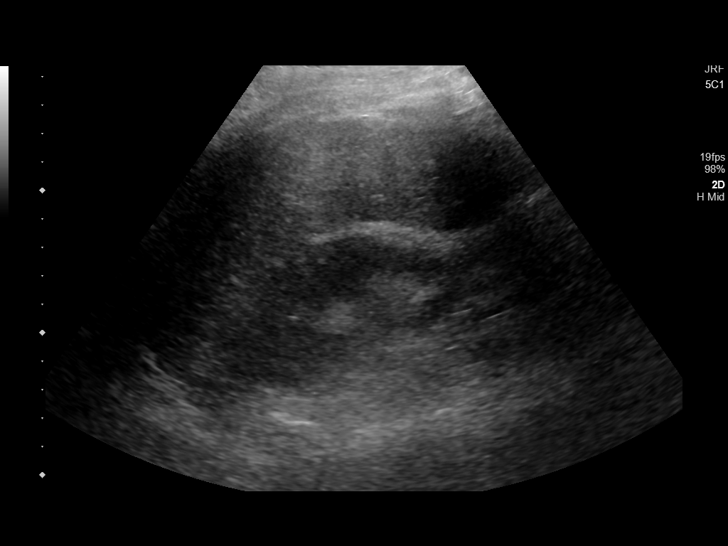

[14 of 25 positions shown; findings below may reference images not displayed]

FINDINGS: Gallbladder:

Gallbladder is distended. Gallbladder sludge is present. Gallbladder
wall is normal, 1.4 millimeters. No sonographic Murphy sign or
pericholecystic fluid.

Common bile duct:

Diameter: 2.9 millimeter

Liver:

Heterogeneous and echogenic. Nodular surface of the liver suggests
cirrhosis. No focal liver lesions. Portal vein is patent on color
Doppler imaging with normal direction of blood flow towards the
liver.

Other: None
IMPRESSION: 1. Distended gallbladder containing sludge. No evidence for acute
cholecystitis.
2. Echogenic and nodular liver, consistent with cirrhosis. No focal
liver lesions.

## 2019-02-08 MED ORDER — OXYCODONE-ACETAMINOPHEN 5-325 MG PO TABS
1.0000 | ORAL_TABLET | Freq: Once | ORAL | Status: AC
  Administered 2019-02-08: 1 via ORAL
  Filled 2019-02-08: qty 1

## 2019-02-08 MED ORDER — VANCOMYCIN HCL 10 G IV SOLR: 1.0000 g | Freq: Once | INTRAVENOUS | Status: DC

## 2019-02-08 MED ORDER — POTASSIUM CHLORIDE 10 MEQ/100ML IV SOLN
10.0000 meq | INTRAVENOUS | Status: AC
  Administered 2019-02-08 (×2): 10 meq via INTRAVENOUS
  Filled 2019-02-08 (×2): qty 100

## 2019-02-08 MED ORDER — MAGNESIUM SULFATE 2 GM/50ML IV SOLN
2.0000 g | Freq: Once | INTRAVENOUS | Status: AC
  Administered 2019-02-08: 20:00:00 2 g via INTRAVENOUS
  Filled 2019-02-08: qty 50

## 2019-02-08 MED ORDER — LACTATED RINGERS IV BOLUS
1000.0000 mL | Freq: Once | INTRAVENOUS | Status: AC
  Administered 2019-02-08: 1000 mL via INTRAVENOUS

## 2019-02-08 MED ORDER — VANCOMYCIN HCL IN DEXTROSE 1-5 GM/200ML-% IV SOLN
1000.0000 mg | Freq: Once | INTRAVENOUS | Status: AC
  Administered 2019-02-08: 22:00:00 1000 mg via INTRAVENOUS
  Filled 2019-02-08: qty 200

## 2019-02-08 MED ORDER — LORAZEPAM 2 MG/ML IJ SOLN: 1.0000 mg | INTRAMUSCULAR | Status: AC | PRN

## 2019-02-08 MED ORDER — POTASSIUM CHLORIDE 20 MEQ/15ML (10%) PO SOLN
40.0000 meq | Freq: Once | ORAL | Status: AC
  Administered 2019-02-08: 40 meq via ORAL
  Filled 2019-02-08: qty 30

## 2019-02-08 MED ORDER — LORAZEPAM 1 MG PO TABS
1.0000 mg | ORAL_TABLET | ORAL | Status: AC | PRN
  Filled 2019-02-08: qty 1

## 2019-02-08 NOTE — ED Notes (Signed)
ED TO INPATIENT HANDOFF REPORT  ED Nurse Name and Phone #:    S Name/Age/Gender Adriana Miller 63 y.o. female Room/Bed: ED18HA/ED18HA  Code Status   Code Status: Not on file  Home/SNF/Other Home Patient oriented to: self, place, time and situation Is this baseline? Yes   Triage Complete: Triage complete  Chief Complaint Weakness  Triage Note Patient to ed via EMS from home. Reports diarrhea x 2 months, and not able to ambulate. Reports pain to B/L feet.    Allergies Allergies  Allergen Reactions  . Aspirin Other (See Comments)    Nose bleed    Level of Care/Admitting Diagnosis ED Disposition    ED Disposition Condition [facility] Area: [place] [[id number]]  Level of Care: Med-Surg [16]  Covid Evaluation: Asymptomatic Screening Protocol (No Symptoms)  Diagnosis: Lower extremity cellulitis [id number]  Admitting Physician: Nicholes Mango [[id number]]  Attending Physician: Nicholes Mango [[id number]]  Estimated length of stay: past midnight tomorrow  Certification:: I certify this patient will need inpatient services for at least 2 midnights  PT Class (Do Not Modify): Inpatient [101]  PT Acc Code (Do Not Modify): Private [1]       B Medical/Surgery History Past Medical History:  Diagnosis Date  . Cerebral aneurysm   . Chronic kidney disease   . Diabetes mellitus without complication ([place])   . Hyperlipidemia   . Hypertension   . Seizure disorder [facility])    Past Surgical History:  Procedure Laterality Date  . CEREBRAL ANEURYSM REPAIR  1991     A IV Location/Drains/Wounds Patient Lines/Drains/Airways Status   Active Line/Drains/Airways    Name:   Placement date:   Placement time:   Site:   Days:   Peripheral IV 02/08/19 Left Hand   02/08/19    1544    Hand   less than 1          Intake/Output Last 24 hours  Intake/Output Summary (Last 24 hours) at 02/08/2019 2152 Last data filed at 02/08/2019 2124 Gross per 24 hour  Intake  1250 ml  Output -  Net 1250 ml    Labs/Imaging Results for orders placed or performed during the hospital encounter of 02/08/19 (from the past 48 hour(s))  CBC with Differential     Status: Abnormal   Collection Time: 02/08/19  3:51 PM  Result Value Ref Range   WBC 5.6 4.0 - 10.5 K/uL   RBC 3.55 (L) 3.87 - 5.11 MIL/uL   Hemoglobin 11.7 (L) 12.0 - 15.0 g/dL   HCT 34.8 (L) 36.0 - 46.0 %   MCV 98.0 80.0 - 100.0 fL   MCH 33.0 26.0 - 34.0 pg   MCHC 33.6 30.0 - 36.0 g/dL   RDW 13.9 11.5 - 15.5 %   Platelets 43 (L) 150 - 400 K/uL    Comment: PLATELET COUNT CONFIRMED BY SMEAR Immature Platelet Fraction may be clinically indicated, consider ordering this additional test [id number]    nRBC 0.0 0.0 - 0.2 %   Neutrophils Relative % 82 %   Neutro Abs 4.6 1.7 - 7.7 K/uL   Lymphocytes Relative 8 %   Lymphs Abs 0.5 (L) 0.7 - 4.0 K/uL   Monocytes Relative 9 %   Monocytes Absolute 0.5 0.1 - 1.0 K/uL   Eosinophils Relative 0 %   Eosinophils Absolute 0.0 0.0 - 0.5 K/uL   Basophils Relative 0 %   Basophils Absolute 0.0 0.0 - 0.1 K/uL   Immature Granulocytes 1 %  Abs Immature Granulocytes 0.04 0.00 - 0.07 K/uL    Comment: Performed at [facility], [address] [place], [place] [address]  Comprehensive metabolic panel     Status: Abnormal   Collection Time: 02/08/19  3:51 PM  Result Value Ref Range   Sodium 133 (L) 135 - 145 mmol/L   Potassium 2.9 (L) 3.5 - 5.1 mmol/L   Chloride 91 (L) 98 - 111 mmol/L   CO2 22 22 - 32 mmol/L   Glucose, Bld 108 (H) 70 - 99 mg/dL   BUN 8 8 - 23 mg/dL   Creatinine, Ser 1.10 (H) 0.44 - 1.00 mg/dL   Calcium 7.6 (L) 8.9 - 10.3 mg/dL   Total Protein 8.6 (H) 6.5 - 8.1 g/dL   Albumin 2.9 (L) 3.5 - 5.0 g/dL   AST 72 (H) 15 - 41 U/L   ALT 25 0 - 44 U/L   Alkaline Phosphatase 50 38 - 126 U/L   Total Bilirubin 3.5 (H) 0.3 - 1.2 mg/dL   GFR calc non Af Amer 53 (L) >60 mL/min   GFR calc Af Amer >60 >60 mL/min   Anion gap 20 (H) 5 - 15    Comment:  Performed at [facility], [address] [place], [place] [address]  Lipase, blood     Status: None   Collection Time: 02/08/19  3:51 PM  Result Value Ref Range   Lipase 36 11 - 51 U/L    Comment: Performed at [facility], [address], [place], [place] [address]  Magnesium     Status: Abnormal   Collection Time: 02/08/19  3:51 PM  Result Value Ref Range   Magnesium 1.1 (L) 1.7 - 2.4 mg/dL    Comment: Performed at [facility], [address] [place], [place] [address]  Protime-INR     Status: Abnormal   Collection Time: 02/08/19  3:51 PM  Result Value Ref Range   Prothrombin Time 27.2 (H) 11.4 - 15.2 seconds   INR 2.6 (H) 0.8 - 1.2    Comment: (NOTE) INR goal varies based on device and disease states. Performed at [facility], [address] [place], [place] [address]   Ethanol     Status: None   Collection Time: 02/08/19  3:51 PM  Result Value Ref Range   Alcohol, Ethyl (B) <10 <10 mg/dL    Comment: (NOTE) Lowest detectable limit for serum alcohol is 10 mg/dL. For medical purposes only. Performed at [facility], [address], [place], [place] [address]    Dg Chest 2 View  Result Date: 02/08/2019 CLINICAL DATA:  Weakness and diarrhea. EXAM: CHEST - 2 VIEW COMPARISON:  Chest x-ray 10/09/2018 FINDINGS: The cardiac silhouette, mediastinal and hilar contours are within normal limits and stable. The lungs are clear. No pleural effusion. No pneumothorax. No worrisome pulmonary lesions. The bony thorax is intact. Stable degenerative changes involving both shoulders. IMPRESSION: No acute cardiopulmonary findings. Electronically Signed   By: Marijo Sanes M.D.   On: 02/08/2019 16:21   Dg Foot 2 Views Left  Result Date: 02/08/2019 CLINICAL DATA:  Pain and swelling in the left foot for 2 months. EXAM: LEFT FOOT - 2 VIEW COMPARISON:  None. FINDINGS: Mild midfoot and hindfoot degenerative changes but no acute fracture or  destructive bony changes. Calcaneal spurring changes are noted. Mild hallux valgus deformity noted. IMPRESSION: Mild degenerative changes but no acute bony findings. Electronically Signed   By: Ricky Stabs.D.  On: 02/08/2019 16:22   US Abdomen Limited Ruq  Result Date: 02/08/2019 CLINICAL DATA:  Hyperbilirubinemia. EXAM: ULTRASOUND ABDOMEN LIMITED RIGHT UPPER QUADRANT COMPARISON:  None. FINDINGS: Gallbladder: Gallbladder is distended. Gallbladder sludge is present. Gallbladder wall is normal, 1.4 millimeters. No sonographic Murphy sign or pericholecystic fluid. Common bile duct: Diameter: 2.9 millimeter Liver: Heterogeneous and echogenic. Nodular surface of the liver suggests cirrhosis. No focal liver lesions. Portal vein is patent on color Doppler imaging with normal direction of blood flow towards the liver. Other: None IMPRESSION: 1. Distended gallbladder containing sludge. No evidence for acute cholecystitis. 2. Echogenic and nodular liver, consistent with cirrhosis. No focal liver lesions. Electronically Signed   By: Nolon Nations M.D.   On: 02/08/2019 17:39    Pending Labs Unresulted Labs (From admission, onward)    Start     Ordered   02/08/19 2115  Comprehensive metabolic panel  Once-Timed,   STAT     02/08/19 2111   02/08/19 2111  Magnesium  Once,   STAT     02/08/19 2111   02/08/19 2111  Phosphorus  Once,   STAT     02/08/19 2111   02/08/19 2111  CBC  Once,   STAT     02/08/19 2111   02/08/19 2016  C difficile quick scan w PCR reflex  (C Difficile quick screen w PCR reflex panel)  Once, for 24 hours,   STAT     02/08/19 2015   02/08/19 2016  GI pathogen panel by PCR, stool  (Gastrointestinal Panel by PCR, Stool                                                                                                                                                     *Does Not include CLOSTRIDIUM DIFFICILE testing.**If CDIFF testing is needed, select the C Difficile Quick Screen w PCR  reflex order below)  Once,   STAT     02/08/19 2015   02/08/19 2016  Norovirus group 1 & 2 by PCR, stool  (Norovirus group 1 & 2 by PCR, stool panel)  Once,   STAT     02/08/19 2015   02/08/19 1823  SARS CORONAVIRUS 2 (TAT 6-24 HRS) Nasopharyngeal Nasopharyngeal Swab  (Asymptomatic/Tier 2 Patients Labs)  Once,   STAT    Question Answer Comment  Is this test for diagnosis or screening Screening   Symptomatic for COVID-19 as defined by CDC No   Hospitalized for COVID-19 No   Admitted to ICU for COVID-19 No   Previously tested for COVID-19 No   Resident in a congregate (group) care setting No   Employed in healthcare setting No   Pregnant No      02/08/19 1822   02/08/19 1548  Urinalysis, Complete w Microscopic  ONCE - STAT,   STAT  02/08/19 1548   Signed and Held  HIV Antibody (routine testing w rflx)  (HIV Antibody (Routine testing w reflex) panel)  Once,   R     Signed and Held   Signed and Held  Comprehensive metabolic panel  Tomorrow morning,   R     Signed and Held   Signed and Held  CBC  Tomorrow morning,   R     Signed and Held   Signed and Held  Hemoglobin and hematocrit, blood  Now then every 6 hours,   STAT     Signed and Held          Vitals/Pain Today's Vitals   02/08/19 1811 02/08/19 1811 02/08/19 2038 02/08/19 2151  BP:  128/68 120/67 120/74  Pulse:  78 84 88  Resp:  18 17   Temp:      TempSrc:      SpO2:  99% 99%   Weight:      Height:      PainSc: 4  4  2       Isolation Precautions Enteric precautions (UV disinfection)  Medications Medications  potassium chloride 20 MEQ/15ML (10%) solution 40 mEq (has no administration in time range)  vancomycin (VANCOCIN) IVPB 1000 mg/200 mL premix (has no administration in time range)  LORazepam (ATIVAN) tablet 1-4 mg (has no administration in time range)    Or  LORazepam (ATIVAN) injection 1-4 mg (has no administration in time range)  oxyCODONE-acetaminophen (PERCOCET/ROXICET) 5-325 MG per tablet 1 tablet (1  tablet Oral Given 02/08/19 1643)  lactated ringers bolus 1,000 mL (0 mLs Intravenous Stopped 02/08/19 2124)  magnesium sulfate IVPB 2 g 50 mL (0 g Intravenous Stopped 02/08/19 2124)  potassium chloride 10 mEq in 100 mL IVPB (0 mEq Intravenous Stopped 02/08/19 2007)    Mobility walks Moderate fall risk   Focused Assessments medicine    R Recommendations: See Admitting Provider Note  Report given to:   Additional Notes:

## 2019-02-08 NOTE — ED Provider Notes (Signed)
[facility] Emergency Department Provider Note   ____________________________________________   First MD Initiated Contact with Patient 02/08/19 1538     (approximate)  I have reviewed the triage vital signs and the nursing notes.   HISTORY  Chief Complaint Diarrhea and Fatigue    HPI Adriana Miller is a 62 y.o. female with past medical history of hypertension, diabetes, A. fib on Eliquis, CKD, and CHF who presents to the ED complaining of foot pain and generalized weakness.  Patient reports she has had diarrhea ongoing for about the past 2 months, but recently noticed bright red blood mixed in with her stool.  She states she has been feeling increasingly weak, but has not had any fevers, cough, chest pain, or shortness of breath.  She has noticed a couple days of increasing pain in her left foot, which is made it so she is unable to walk.  Family came to check on her today as she lives alone and called EMS.        Past Medical History:  Diagnosis Date  . Cerebral aneurysm   . Chronic kidney disease   . Diabetes mellitus without complication ([place])   . Hyperlipidemia   . Hypertension   . Seizure disorder [facility])     Patient Active Problem List   Diagnosis Date Noted  . Acute on chronic heart failure with preserved ejection fraction (HFpEF) ([place]) 01/15/2019  . QT prolongation 01/15/2019  . Dyspnea on exertion 10/10/2018  . Persistent atrial fibrillation ([place]) 10/10/2018  . Diastolic dysfunction [id number]  . Acute upper respiratory infection 09/07/2017  . Essential hypertension 09/07/2017  . Type 2 diabetes mellitus, uncontrolled ([place]) 04/05/2017  . Weakness 04/05/2017  . Mixed hyperlipidemia 04/05/2017  . Alcohol abuse with other alcohol-induced disorder ([place]) 04/05/2017  . B12 deficiency 04/05/2017  . History of seizure 05/20/2015  . Brain aneurysm 05/20/2015    Past Surgical History:  Procedure Laterality Date  . CEREBRAL ANEURYSM  REPAIR  1991    Prior to Admission medications   Medication Sig Start Date End Date Taking? Authorizing Provider  diltiazem (CARDIZEM CD) 120 MG 24 hr capsule Take 120 mg by mouth daily. 01/09/19  Yes [provider]  apixaban (ELIQUIS) 5 MG TABS tablet Take 1 tablet (5 mg total) by mouth 2 (two) times daily. 01/17/19   End, Harrell Gave, MD  enalapril (VASOTEC) 10 MG tablet Take 0.5 tablets (5 mg total) by mouth daily. 01/22/19   Ronnell Freshwater, NP  furosemide (LASIX) 20 MG tablet Take 1 tablet (20 mg total) by mouth daily. 01/08/19 04/08/19  End, Harrell Gave, MD  metoprolol tartrate (LOPRESSOR) 25 MG tablet Take 1 tablet (25 mg total) by mouth 2 (two) times daily. 01/08/19 04/08/19  End, Harrell Gave, MD    Allergies Aspirin  Family History  Problem Relation Age of Onset  . Heart attack Brother 94    Social History Social History   Tobacco Use  . Smoking status: Never Smoker  . Smokeless tobacco: Current User    Types: Snuff  Substance Use Topics  . Alcohol use: Yes    Alcohol/week: 14.0 standard drinks    Types: 14 Cans of beer per week    Comment: pt has cut back on alcohol use  . Drug use: No    Review of Systems  Constitutional: No fever/chills.  Positive for generalized weakness. Eyes: No visual changes. ENT: No sore throat. Cardiovascular: Denies chest pain. Respiratory: Denies shortness of breath. Gastrointestinal: No abdominal pain.  No nausea, no vomiting.  Positive for diarrhea and bloody stool.  No constipation. Genitourinary: Negative for dysuria. Musculoskeletal: Negative for back pain.  Positive for left foot pain. Skin: Negative for rash. Neurological: Negative for headaches, focal weakness or numbness.  ____________________________________________   PHYSICAL EXAM:  VITAL SIGNS: ED Triage Vitals  Enc Vitals Group     BP 02/08/19 1543 124/88     Pulse Rate 02/08/19 1543 81     Resp 02/08/19 1543 18     Temp 02/08/19 1543 98.8 F (37.1  C)     Temp Source 02/08/19 1543 Oral     SpO2 02/08/19 1543 100 %     Weight 02/08/19 1545 158 lb (71.7 kg)     Height 02/08/19 1545 5\' 3"  (1.6 m)     Head Circumference --      Peak Flow --      Pain Score 02/08/19 1544 6     Pain Loc --      Pain Edu? --      Excl. in [place]? --     Constitutional: Alert and oriented. Eyes: Conjunctivae are normal. Head: Atraumatic. Nose: No congestion/rhinnorhea. Mouth/Throat: Mucous membranes are moist. Neck: Normal ROM Cardiovascular: Normal rate, regular rhythm. Grossly normal heart sounds. Respiratory: Normal respiratory effort.  No retractions. Lungs CTAB. Gastrointestinal: Soft and nontender. No distention. Genitourinary: deferred Musculoskeletal: Edema, erythema, and warmth primarily to dorsum of left foot but also affecting medial portion with associated tenderness but no fluctuance.  No apparent wounds or ulcerations.  No calf edema or tenderness bilaterally. Neurologic:  Normal speech and language. No gross focal neurologic deficits are appreciated. Skin:  Skin is warm, dry and intact. No rash noted. Psychiatric: Mood and affect are normal. Speech and behavior are normal.  ____________________________________________   LABS (all labs ordered are listed, but only abnormal results are displayed)  Labs Reviewed  CBC WITH DIFFERENTIAL/PLATELET - Abnormal; Notable for the following components:      Result Value   RBC 3.55 (*)    Hemoglobin 11.7 (*)    HCT 34.8 (*)    Platelets 43 (*)    Lymphs Abs 0.5 (*)    All other components within normal limits  COMPREHENSIVE METABOLIC PANEL - Abnormal; Notable for the following components:   Sodium 133 (*)    Potassium 2.9 (*)    Chloride 91 (*)    Glucose, Bld 108 (*)    Creatinine, Ser 1.10 (*)    Calcium 7.6 (*)    Total Protein 8.6 (*)    Albumin 2.9 (*)    AST 72 (*)    Total Bilirubin 3.5 (*)    GFR calc non Af Amer 53 (*)    Anion gap 20 (*)    All other components within  normal limits  MAGNESIUM - Abnormal; Notable for the following components:   Magnesium 1.1 (*)    All other components within normal limits  SARS CORONAVIRUS 2 (TAT 6-24 HRS)  LIPASE, BLOOD  URINALYSIS, COMPLETE (UACMP) WITH MICROSCOPIC  PROTIME-INR  ETHANOL   ____________________________________________  EKG  ED ECG REPORT I, Blake Divine, the attending physician, personally viewed and interpreted this ECG.   Date: 02/08/2019  EKG Time: 16:34  Rate: 81  Rhythm: normal sinus rhythm, occasional PAC  Axis: Normal  Intervals:none  ST&T Change: None    PROCEDURES  Procedure(s) performed (including Critical Care):  Procedures   ____________________________________________   INITIAL IMPRESSION / ASSESSMENT AND PLAN / ED COURSE  63 year old female, anticoagulated on Eliquis for atrial fibrillation, presents to the ED with complaints of increased generalized weakness following diarrhea for about the past 2 months.  She also has concern for blood in her stool as well as pain and swelling to her left foot.  Her abdominal exam is reassuring with no focal tenderness, doubt surgical intra-abdominal pathology.  EKG shows sinus rhythm without acute ischemic changes.  Will screen labs for evidence of significant GI bleeding.  Appearance of patient's left foot is also concerning for cellulitis given her tenderness, erythema, and warmth.  There is no obvious wound or ulceration to skin, will screen x-ray for possible osteomyelitis.  X-rays of left foot are negative for acute process, no obvious signs of osteomyelitis.  Will treat for cellulitis with vancomycin.  Labs show no drop in hemoglobin to suggest significant GI bleed, however patient does have low platelets and elevated bilirubin, suspect this is secondary to her alcohol abuse.  Right upper quadrant ultrasound was performed and is consistent with cirrhosis.  Patient continues to be unable to walk due to pain in her left foot,  will admit for treatment of cellulitis.      ____________________________________________   FINAL CLINICAL IMPRESSION(S) / ED DIAGNOSES  Final diagnoses:  Bilirubinemia  Cellulitis of left lower extremity  Thrombocytopenia (HCC)  Alcoholic cirrhosis of liver without ascites [facility])     ED Discharge Orders    None       Note:  This document was prepared using Dragon voice recognition software and may include unintentional dictation errors.   Blake Divine, MD 02/08/19 [phone]

## 2019-02-08 NOTE — ED Notes (Signed)
Attempt to call report a per charge nurse Ardelle Park spoke with charge nurse, bed put on hold due to sudden decrease in nursing staff. Will call back for report if anything changes.

## 2019-02-08 NOTE — ED Triage Notes (Signed)
Patient to ed via EMS from home. Reports diarrhea x 2 months, and not able to ambulate. Reports pain to B/L feet.

## 2019-02-08 NOTE — ED Notes (Signed)
Patient placed on bedpan.

## 2019-02-08 NOTE — ED Notes (Signed)
Labs sent, cwal precaution initiated.

## 2019-02-08 NOTE — H&P (Signed)
[facility] [place] NAME: Adriana Miller    MR#:  [id number]  DATE OF BIRTH:  [birth year]  DATE OF ADMISSION:  02/08/2019  PRIMARY CARE PHYSICIAN: Ronnell Freshwater, NP   REQUESTING/REFERRING PHYSICIAN: Charna Archer  CHIEF COMPLAINT:  Diarrhea with blood  HISTORY OF PRESENT ILLNESS:  Adriana Miller  is a 63 y.o. female with a known history of alcohol abuse, chronic kidney disease, diabetes mellitus, hypertension, hyperlipidemia and seizure disorder with history of alcohol abuse is presenting to the ED with a chief complaint of left foot redness and diarrhea with bloody streaks for the past few days.  Patient reports that she has been having watery diarrhea with bloody streaks and the last bowel movement was while patient was heading over to the ED via ambulance.  Admits to drinking alcohol and last drink was 4 days ago.  Denies any chest pain or shortness of breath.  Abdominal ultrasound has revealed liver cirrhosis.  Platelet count at 40,000.  Potassium at 2.8 and magnesium at 1.1 and hemoglobin at 11.7  PAST MEDICAL HISTORY:   Past Medical History:  Diagnosis Date  . Cerebral aneurysm   . Chronic kidney disease   . Diabetes mellitus without complication ([place])   . Hyperlipidemia   . Hypertension   . Seizure disorder ([place])     PAST SURGICAL HISTOIRY:   Past Surgical History:  Procedure Laterality Date  . CEREBRAL ANEURYSM REPAIR  1991    SOCIAL HISTORY:   Social History   Tobacco Use  . Smoking status: Never Smoker  . Smokeless tobacco: Current User    Types: Snuff  Substance Use Topics  . Alcohol use: Yes    Alcohol/week: 14.0 standard drinks    Types: 14 Cans of beer per week    Comment: pt has cut back on alcohol use    FAMILY HISTORY:   Family History  Problem Relation Age of Onset  . Heart attack Brother 50    DRUG ALLERGIES:   Allergies  Allergen Reactions  . Aspirin Other (See Comments)    Nose bleed     REVIEW OF SYSTEMS:  CONSTITUTIONAL: No fever, fatigue or weakness.  EYES: No blurred or double vision.  EARS, NOSE, AND THROAT: No tinnitus or ear pain.  RESPIRATORY: No cough, shortness of breath, wheezing or hemoptysis.  CARDIOVASCULAR: No chest pain, orthopnea, edema.  GASTROINTESTINAL: No nausea, vomiting,  abdominal pain.  Reporting diarrhea with blood GENITOURINARY: No dysuria, hematuria.  ENDOCRINE: No polyuria, nocturia,  HEMATOLOGY: No anemia, easy bruising or bleeding SKIN: Left foot with redness MUSCULOSKELETAL: No joint pain or arthritis.   NEUROLOGIC: No tingling, numbness, weakness.  PSYCHIATRY: No anxiety or depression.   MEDICATIONS AT HOME:   Prior to Admission medications   Medication Sig Start Date End Date Taking? Authorizing Provider  apixaban (ELIQUIS) 5 MG TABS tablet Take 1 tablet (5 mg total) by mouth 2 (two) times daily. 01/17/19  Yes End, Harrell Gave, MD  enalapril (VASOTEC) 10 MG tablet Take 0.5 tablets (5 mg total) by mouth daily. 01/22/19  Yes Boscia, Greer Ee, NP  furosemide (LASIX) 20 MG tablet Take 1 tablet (20 mg total) by mouth daily. 01/08/19 04/08/19 Yes End, Harrell Gave, MD  metoprolol tartrate (LOPRESSOR) 25 MG tablet Take 1 tablet (25 mg total) by mouth 2 (two) times daily. 01/08/19 04/08/19 Yes End, Harrell Gave, MD      VITAL SIGNS:  Blood pressure 120/67, pulse 84, temperature 98.8 F (37.1 C), temperature source  Oral, resp. rate 17, height 5\' 3"  (1.6 m), weight 71.7 kg, SpO2 99 %.  PHYSICAL EXAMINATION:  GENERAL:  63 y.o.-year-old patient lying in the bed with no acute distress.  EYES: Pupils equal, round, reactive to light and accommodation. No scleral icterus. Extraocular muscles intact.  HEENT: Head atraumatic, normocephalic. Oropharynx and nasopharynx clear.  NECK:  Supple, no jugular venous distention. No thyroid enlargement, no tenderness.  LUNGS: Normal breath sounds bilaterally, no wheezing, rales,rhonchi or crepitation. No use  of accessory muscles of respiration.  CARDIOVASCULAR: S1, S2 normal. No murmurs, rubs, or gallops.  ABDOMEN: Soft, nontender, nondistended. Bowel sounds present EXTREMITIES: No pedal edema, cyanosis, or clubbing.  NEUROLOGIC: Cranial nerves II through XII are intact. Muscle strength 5/5 in all extremities. Sensation intact. Gait not checked.  PSYCHIATRIC: The patient is alert and oriented x 3.  SKIN: Left foot dorsal aspect with erythema and minimal tenderness is present no edema no discharge no open wounds  LABORATORY PANEL:   CBC Recent Labs  Lab 02/08/19 1551  WBC 5.6  HGB 11.7*  HCT 34.8*  PLT 43*   ------------------------------------------------------------------------------------------------------------------  Chemistries  Recent Labs  Lab 02/08/19 1551  NA 133*  K 2.9*  CL 91*  CO2 22  GLUCOSE 108*  BUN 8  CREATININE 1.10*  CALCIUM 7.6*  MG 1.1*  AST 72*  ALT 25  ALKPHOS 50  BILITOT 3.5*   ------------------------------------------------------------------------------------------------------------------  Cardiac Enzymes No results for input(s): TROPONINI in the last 168 hours. ------------------------------------------------------------------------------------------------------------------  RADIOLOGY:  Dg Chest 2 View  Result Date: 02/08/2019 CLINICAL DATA:  Weakness and diarrhea. EXAM: CHEST - 2 VIEW COMPARISON:  Chest x-ray 10/09/2018 FINDINGS: The cardiac silhouette, mediastinal and hilar contours are within normal limits and stable. The lungs are clear. No pleural effusion. No pneumothorax. No worrisome pulmonary lesions. The bony thorax is intact. Stable degenerative changes involving both shoulders. IMPRESSION: No acute cardiopulmonary findings. Electronically Signed   By: Marijo Sanes M.D.   On: 02/08/2019 16:21   Dg Foot 2 Views Left  Result Date: 02/08/2019 CLINICAL DATA:  Pain and swelling in the left foot for 2 months. EXAM: LEFT FOOT - 2 VIEW  COMPARISON:  None. FINDINGS: Mild midfoot and hindfoot degenerative changes but no acute fracture or destructive bony changes. Calcaneal spurring changes are noted. Mild hallux valgus deformity noted. IMPRESSION: Mild degenerative changes but no acute bony findings. Electronically Signed   By: Marijo Sanes M.D.   On: 02/08/2019 16:22   US Abdomen Limited Ruq  Result Date: 02/08/2019 CLINICAL DATA:  Hyperbilirubinemia. EXAM: ULTRASOUND ABDOMEN LIMITED RIGHT UPPER QUADRANT COMPARISON:  None. FINDINGS: Gallbladder: Gallbladder is distended. Gallbladder sludge is present. Gallbladder wall is normal, 1.4 millimeters. No sonographic Murphy sign or pericholecystic fluid. Common bile duct: Diameter: 2.9 millimeter Liver: Heterogeneous and echogenic. Nodular surface of the liver suggests cirrhosis. No focal liver lesions. Portal vein is patent on color Doppler imaging with normal direction of blood flow towards the liver. Other: None IMPRESSION: 1. Distended gallbladder containing sludge. No evidence for acute cholecystitis. 2. Echogenic and nodular liver, consistent with cirrhosis. No focal liver lesions. Electronically Signed   By: Nolon Nations M.D.   On: 02/08/2019 17:39    EKG:   Orders placed or performed during the hospital encounter of 02/08/19  . ED EKG  . ED EKG    IMPRESSION AND PLAN:    #Left lower extremity cellulitis Admit to MedSurg unit IV Rocephin X-ray with no osteomyelitis Pain meds as needed with Tylenol  #Hematochezia  Hemoglobin at 11.7 and patient is hemodynamically stable Check stool for C. difficile toxin, norovirus and GI panel Hemoglobin and hematocrit to be monitored every 6 hours and transfuse as needed Enteric precautions Patient has received 1 dose of IV vancomycin and will continue IV Rocephin GI consult placed to Dr. Marius Ditch, message sent via epic  #Hypokalemia and hypomagnesemia Replete and recheck  #Alcohol abuse CIWA protocol IV fluids with banana  bag  #Thrombocytopenia with cirrhosis on ultrasound No active bleeding or bruising other than hematochezia Hold home medication Eliquis Closely monitor platelet count   DVT prophylaxis with SCDs    All the records are reviewed and case discussed with ED provider. Management plans discussed with the patient, family and they are in agreement.  CODE STATUS: fc   TOTAL TIME TAKING CARE OF THIS PATIENT: 45  minutes.   Note: This dictation was prepared with Dragon dictation along with smaller phrase technology. Any transcriptional errors that result from this process are unintentional.  Nicholes Mango M.D on 02/08/2019 at 9:12 PM  Between 7am to 6pm - Pager - [phone]  After 6pm go to [email or web address] - password [email or web address] [facility]  Office  [phone]  CC: Primary care physician; Ronnell Freshwater, NP

## 2019-02-08 NOTE — ED Notes (Signed)
Labs sent ekg obtained.

## 2019-02-08 NOTE — ED Notes (Signed)
Patient swabbed for covid.

## 2019-02-08 NOTE — Progress Notes (Signed)
Family Meeting Note  Advance Directive:yes  Today a meeting took place with the Patient.    The following clinical team members were present during this meeting:MD  The following were discussed:Patient's diagnosis: Bloody diarrhea, alcohol abuse, diabetes mellitus, hypokalemia, hypomagnesemia, left foot cellulitis with past medical history of hypertension diabetes mellitus and hyperlipidemia will be admitted to the hospital.  The diagnosis and plan of care was discussed in detail with the patient.  She verbalized understanding of the plan.  , Patient's progosis: Unable to determine and Goals for treatment: Full Code  Brother Jeneen Rinks is a healthcare POA  Additional follow-up to be provided: Hospitalist, GI  Time spent during discussion:17 min  Nicholes Mango, MD

## 2019-02-08 NOTE — ED Notes (Signed)
Report given to receiving nurse

## 2019-02-08 NOTE — ED Notes (Signed)
amitting md at bedside to assess and discuss further plan of care. Patient awaiting bed status.

## 2019-02-08 NOTE — ED Notes (Signed)
Off unit to xray

## 2019-02-09 ENCOUNTER — Encounter: Payer: Self-pay | Admitting: Emergency Medicine

## 2019-02-09 ENCOUNTER — Other Ambulatory Visit: Payer: Self-pay

## 2019-02-09 DIAGNOSIS — K921 Melena: Secondary | ICD-10-CM

## 2019-02-09 DIAGNOSIS — K7030 Alcoholic cirrhosis of liver without ascites: Secondary | ICD-10-CM

## 2019-02-09 LAB — CBC
HCT: 28.6 % — ABNORMAL LOW (ref 36.0–46.0)
Hemoglobin: 9.7 g/dL — ABNORMAL LOW (ref 12.0–15.0)
MCH: 33.3 pg (ref 26.0–34.0)
MCHC: 33.9 g/dL (ref 30.0–36.0)
MCV: 98.3 fL (ref 80.0–100.0)
Platelets: 29 10*3/uL — CL (ref 150–400)
RBC: 2.91 MIL/uL — ABNORMAL LOW (ref 3.87–5.11)
RDW: 13.9 % (ref 11.5–15.5)
WBC: 4.6 10*3/uL (ref 4.0–10.5)
nRBC: 0 % (ref 0.0–0.2)

## 2019-02-09 LAB — HEMOGLOBIN AND HEMATOCRIT, BLOOD
HCT: 32.1 % — ABNORMAL LOW (ref 36.0–46.0)
HCT: 27 % — ABNORMAL LOW (ref 36.0–46.0)
Hemoglobin: 10.8 g/dL — ABNORMAL LOW (ref 12.0–15.0)
Hemoglobin: 9.4 g/dL — ABNORMAL LOW (ref 12.0–15.0)

## 2019-02-09 LAB — COMPREHENSIVE METABOLIC PANEL
ALT: 18 U/L (ref 0–44)
ALT: 21 U/L (ref 0–44)
AST: 45 U/L — ABNORMAL HIGH (ref 15–41)
AST: 59 U/L — ABNORMAL HIGH (ref 15–41)
Albumin: 2.1 g/dL — ABNORMAL LOW (ref 3.5–5.0)
Albumin: 2.5 g/dL — ABNORMAL LOW (ref 3.5–5.0)
Alkaline Phosphatase: 34 U/L — ABNORMAL LOW (ref 38–126)
Alkaline Phosphatase: 42 U/L (ref 38–126)
Anion gap: 12 (ref 5–15)
Anion gap: 9 (ref 5–15)
BUN: 11 mg/dL (ref 8–23)
BUN: 12 mg/dL (ref 8–23)
CO2: 26 mmol/L (ref 22–32)
CO2: 26 mmol/L (ref 22–32)
Calcium: 7 mg/dL — ABNORMAL LOW (ref 8.9–10.3)
Calcium: 7.3 mg/dL — ABNORMAL LOW (ref 8.9–10.3)
Chloride: 91 mmol/L — ABNORMAL LOW (ref 98–111)
Chloride: 95 mmol/L — ABNORMAL LOW (ref 98–111)
Creatinine, Ser: 1.14 mg/dL — ABNORMAL HIGH (ref 0.44–1.00)
Creatinine, Ser: 1.17 mg/dL — ABNORMAL HIGH (ref 0.44–1.00)
GFR calc Af Amer: 57 mL/min — ABNORMAL LOW (ref >60)
GFR calc Af Amer: 58 mL/min — ABNORMAL LOW (ref >60)
GFR calc non Af Amer: 49 mL/min — ABNORMAL LOW (ref >60)
GFR calc non Af Amer: 50 mL/min — ABNORMAL LOW (ref >60)
Glucose, Bld: 124 mg/dL — ABNORMAL HIGH (ref 70–99)
Glucose, Bld: 183 mg/dL — ABNORMAL HIGH (ref 70–99)
Potassium: 3.1 mmol/L — ABNORMAL LOW (ref 3.5–5.1)
Potassium: 3.5 mmol/L (ref 3.5–5.1)
Sodium: 129 mmol/L — ABNORMAL LOW (ref 135–145)
Sodium: 130 mmol/L — ABNORMAL LOW (ref 135–145)
Total Bilirubin: 2.2 mg/dL — ABNORMAL HIGH (ref 0.3–1.2)
Total Bilirubin: 3 mg/dL — ABNORMAL HIGH (ref 0.3–1.2)
Total Protein: 6.5 g/dL (ref 6.5–8.1)
Total Protein: 7.7 g/dL (ref 6.5–8.1)

## 2019-02-09 LAB — MAGNESIUM
Magnesium: 1.4 mg/dL — ABNORMAL LOW (ref 1.7–2.4)
Magnesium: 2.7 mg/dL — ABNORMAL HIGH (ref 1.7–2.4)

## 2019-02-09 LAB — SARS CORONAVIRUS 2 (TAT 6-24 HRS): SARS Coronavirus 2: NEGATIVE

## 2019-02-09 LAB — HIV ANTIBODY (ROUTINE TESTING W REFLEX): HIV Screen 4th Generation wRfx: NONREACTIVE

## 2019-02-09 LAB — PHOSPHORUS: Phosphorus: 2.9 mg/dL (ref 2.5–4.6)

## 2019-02-09 MED ORDER — ACETAMINOPHEN 650 MG RE SUPP: 650.0000 mg | Freq: Four times a day (QID) | RECTAL | Status: DC | PRN

## 2019-02-09 MED ORDER — ACETAMINOPHEN 325 MG PO TABS: 650.0000 mg | ORAL_TABLET | Freq: Four times a day (QID) | ORAL | Status: DC | PRN

## 2019-02-09 MED ORDER — THIAMINE HCL 100 MG/ML IJ SOLN
Freq: Once | INTRAVENOUS | Status: AC
  Administered 2019-02-09: 02:00:00 via INTRAVENOUS
  Filled 2019-02-09: qty 1000

## 2019-02-09 MED ORDER — MAGNESIUM SULFATE 4 GM/100ML IV SOLN
4.0000 g | Freq: Once | INTRAVENOUS | Status: AC
  Administered 2019-02-09: 4 g via INTRAVENOUS
  Filled 2019-02-09: qty 100

## 2019-02-09 MED ORDER — PANTOPRAZOLE SODIUM 40 MG IV SOLR
40.0000 mg | Freq: Two times a day (BID) | INTRAVENOUS | Status: DC
  Administered 2019-02-09 – 2019-02-12 (×9): 40 mg via INTRAVENOUS
  Filled 2019-02-09 (×9): qty 40

## 2019-02-09 MED ORDER — POTASSIUM CHLORIDE 10 MEQ/100ML IV SOLN
10.0000 meq | INTRAVENOUS | Status: AC
  Administered 2019-02-09 (×4): 10 meq via INTRAVENOUS
  Filled 2019-02-09 (×4): qty 100

## 2019-02-09 MED ORDER — SODIUM CHLORIDE 0.9 % IV SOLN
1.0000 g | INTRAVENOUS | Status: DC
  Administered 2019-02-09 – 2019-02-10 (×2): 1 g via INTRAVENOUS
  Filled 2019-02-09: qty 1
  Filled 2019-02-09: qty 10
  Filled 2019-02-09: qty 1

## 2019-02-09 NOTE — TOC Initial Note (Signed)
Transition of Care [facility]) - Initial/Assessment Note    Patient Details  Name: Adriana Miller MRN: [id number] Date of Birth: 16-Oct-1955  [facility] CM/SW Contact:    Adriana Miller, Adriana Miller Phone Number: [phone]  02/09/2019, 5:14 PM  Clinical Narrative: Clinical Social Worker (CSW) met with patient to discuss D/C plan. CSW received consult to assist patient with finding a PCP. Patient was alert and oriented X3 and was sitting up in the bed feeding herself. CSW introduced self and explained role of CSW department. Per patient she lives alone in [place] and her brother Adriana Miller checks on her and takes her groceries. Patient reported that she does not drive and uses [place] for transport. Patient reported that she uses a walker at home. Patient reported that she has 1 adult child that lives in [place]. Patient reported that she has a PCP an [employer]. Patient plans to return home after leaving [facility]. CSW will continue to follow and assist as needed.    Expected Discharge Plan: Home/Self Care Barriers to Discharge: Continued Medical Work up   Patient Goals and CMS Choice Patient states their goals for this hospitalization and ongoing recovery are:: To go home.      Expected Discharge Plan and Services Expected Discharge Plan: Home/Self Care In-house Referral: Clinical Social Work     Living arrangements for the past 2 months: Single Family Home                                      Prior Living Arrangements/Services Living arrangements for the past 2 months: Single Family Home Lives with:: Self Patient language and need for interpreter reviewed:: No Do you feel safe going back to the place where you live?: Yes      Need for Family Participation in Patient Care: No (Comment) Care giver support system in place?: No (comment) Current home services: DME(Has a walker at home.) Criminal Activity/Legal Involvement Pertinent to Current  Situation/Hospitalization: No - Comment as needed  Activities of Daily Living Home Assistive Devices/Equipment: None ADL Screening (condition at time of admission) Patient's cognitive ability adequate to safely complete daily activities?: Yes Is the patient deaf or have difficulty hearing?: No Does the patient have difficulty seeing, even when wearing glasses/contacts?: No Does the patient have difficulty concentrating, remembering, or making decisions?: No Patient able to express need for assistance with ADLs?: No Does the patient have difficulty dressing or bathing?: No Independently performs ADLs?: Yes (appropriate for developmental age) Does the patient have difficulty walking or climbing stairs?: Yes Weakness of Legs: Both Weakness of Arms/Hands: None  Permission Sought/Granted                  Emotional Assessment Appearance:: Appears stated age Attitude/Demeanor/Rapport: Engaged Affect (typically observed): Calm, Pleasant Orientation: : Oriented to Self, Oriented to Place, Oriented to Situation, Oriented to  Time Alcohol / Substance Use: Not Applicable Psych Involvement: No (comment)  Admission diagnosis:  Bilirubinemia [E80.6] Thrombocytopenia (HCC) [D69.6] Cellulitis of left lower extremity [G66.599] Alcoholic cirrhosis of liver without ascites ([place]) [K70.30] Lower extremity cellulitis [J57.017] Patient Active Problem List   Diagnosis Date Noted  . Lower extremity cellulitis 02/08/2019  . Acute on chronic heart failure with preserved ejection fraction (HFpEF) ([place]) 01/15/2019  . QT prolongation 01/15/2019  . Dyspnea on exertion 10/10/2018  . Persistent atrial fibrillation ([place]) 10/10/2018  . Diastolic dysfunction 79/39/0300  .  Acute upper respiratory infection 09/07/2017  . Essential hypertension 09/07/2017  . Type 2 diabetes mellitus, uncontrolled ([place]) 04/05/2017  . Weakness 04/05/2017  . Mixed hyperlipidemia 04/05/2017  . Alcohol abuse with other  alcohol-induced disorder ([place]) 04/05/2017  . B12 deficiency 04/05/2017  . History of seizure 05/20/2015  . Brain aneurysm 05/20/2015   PCP:  Ronnell Freshwater, NP Pharmacy:   [place], [address] [place] [address] Phone: [phone] Fax: [phone]  [id number] [id number]: [phone]: [phone]    Social Determinants of Health (SDOH) Interventions    Readmission Risk Interventions No flowsheet data found.

## 2019-02-09 NOTE — Plan of Care (Signed)
  Problem: Health Behavior/Discharge Planning: Goal: Ability to manage health-related needs will improve Outcome: Progressing Pt understands her POC.  She has no other further questions.  Will continue to monitor and assess.   Problem: Clinical Measurements: Goal: Will remain free from infection Outcome: Progressing  S/Sx of infection monitored and assessed q4 hours/ PRN, along with VS.  Pt has been afebrile thus far.  She is on IV abx per MD's orders.  Will continue to monitored and assess.   Problem: Clinical Measurements: Goal: Diagnostic test results will improve Outcome: Progressing  Pt's labs stable except for her platelet count which is 29 this morning.  Pyreddy, MD notified and aware.    Problem: Clinical Measurements: Goal: Respiratory complications will improve Outcome: Progressing  Respiratory status monitored and assessed q4 hours/ PRN.  Pt is on room air with O2 saturations at 100% and respiration rate of 16 breaths per minute.  Pt has not endorsed c/o SOB or DOE.  Will continue to monitor and assess.   Problem: Clinical Measurements: Goal: Cardiovascular complication will be avoided Outcome: Progressing  Pt is on central telemetry for cardiac monitoring.  She has remained NSR.  Will continue to monitor and assess.   Problem: Activity: Goal: Risk for activity intolerance will decrease Outcome: Progressing Pt is able to turn herself with the assistance of RN staff to reposition herself.  She is able to performed active and passive ROM.  Will continue to monitor and assess.   Problem: Pain Managment: Goal: General experience of comfort will improve Outcome: Progressing  Pt has denied pain thus far.  Will continue to monitor and assess.   Problem: Safety: Goal: Ability to remain free from injury will improve Outcome: Progressing Pt has remained free from falls thus far.  Instructed pt to utilize RN call light for assistance.  Bed alarm implemented to keep pt safe from  falls.  Settings set to third most sensitive settings.  Hourly rounds performed.  Bed in lowest position, locked with two upper side rails engaged.     Problem: Skin Integrity: Goal: Risk for impaired skin integrity will decrease Outcome: Progressing Skin integrity monitored and assessed q-shift/ PRN.  Pt is on q2 hourly turns to prevent further skin impairment.  Tubes and drains assessed for device related pressures sores.  Pt is incontinent of her bowel and bladder.  Pt has an external female catheter to contain her urine.  Perineal care given promptly after each episode.  Will continue to monitor and assess.

## 2019-02-09 NOTE — Progress Notes (Addendum)
Pharmacy Electrolyte Monitoring Consult:  Pharmacy consulted to assist in monitoring and replacing electrolytes in this 63 y.o. female admitted on 02/08/2019 with Diarrhea and fatigue. Pt w/ h/o CKD and EtOH abuse.  Labs:  Sodium (mmol/L)  Date Value  02/09/2019 130 (L)  01/27/2018 131 (L)  10/11/2013 138   Potassium (mmol/L)  Date Value  02/09/2019 3.5  10/11/2013 3.9   Magnesium (mg/dL)  Date Value  02/09/2019 2.7 (H)   Phosphorus (mg/dL)  Date Value  02/09/2019 2.9   Calcium (mg/dL)  Date Value  02/09/2019 7.0 (L)   Calcium, Total (mg/dL)  Date Value  10/11/2013 8.4 (L)   Albumin (g/dL)  Date Value  02/09/2019 2.1 (L)  01/27/2018 4.2   Corrected Calcium: 8.52  Assessment/Plan: 10/23 @ 0634 K: 3.5 , Mg: 2.7. No replacement needed at this time.   Will F/U with AM labs and continue to replace electrolytes as needed.   Pernell Dupre, PharmD, BCPS Clinical Pharmacist 02/09/2019 8:16 AM

## 2019-02-09 NOTE — Consult Note (Signed)
Adriana Lame, MD [facility]  [address], [place], Adriana Miller Phone: [phone] Fax : [phone]  Consultation  Referring Provider:     Dr. Margaretmary Eddy Primary Care Physician:  Ronnell Freshwater, NP Primary Gastroenterologist:     Dr. Allen Norris Reason for Consultation:     GI bleed  Date of Admission:  02/08/2019 Date of Consultation:  02/09/2019         HPI:   Adriana Miller is a 63 y.o. female who has a history of alcohol abuse and thrombocytopenia who was noted to have diarrhea and reports that she had some streaks of blood in her diarrhea.  It appears that the patient had a colonoscopy in 2016 that showed multiple tubular adenomas.  The patient appears to have had thrombocytopenia back in 2015 with her platelets being 100 at that time and her hemoglobin being 11.1.  The patient has a history of seizure disorders hyperlipidemia diabetes kidney disease in addition to her alcohol use.  The patient's hemoglobin has gone down since June and her most recent labs show:  Component     Latest Ref Rng & Units 10/09/2018 02/08/2019 02/08/2019 02/09/2019          3:51 PM  9:59 PM 12:57 AM  RBC     3.87 - 5.11 MIL/uL 3.78 (L) 3.55 (L) 3.12 (L)   Hemoglobin     12.0 - 15.0 g/dL 12.0 11.7 (L) 10.3 (L) 10.8 (L)  HCT     36.0 - 46.0 % 36.7 34.8 (L) 30.4 (L) 32.1 (L)   Component     Latest Ref Rng & Units 02/09/2019 02/09/2019         6:34 AM 12:14 PM  RBC     3.87 - 5.11 MIL/uL 2.91 (L)   Hemoglobin     12.0 - 15.0 g/dL 9.7 (L) 9.4 (L)  HCT     36.0 - 46.0 % 28.6 (L) 27.0 (L)   She denies any belly pain black stools nausea or vomiting.  She also denies any fevers or chills.   Past Medical History:  Diagnosis Date  . Cerebral aneurysm   . Chronic kidney disease   . Diabetes mellitus without complication ([place])   . Hyperlipidemia   . Hypertension   . Seizure disorder [facility])     Past Surgical History:  Procedure Laterality Date  . CEREBRAL ANEURYSM REPAIR  1991     Prior to Admission medications   Medication Sig Start Date End Date Taking? Authorizing Provider  apixaban (ELIQUIS) 5 MG TABS tablet Take 1 tablet (5 mg total) by mouth 2 (two) times daily. 01/17/19  Yes End, Harrell Gave, MD  enalapril (VASOTEC) 10 MG tablet Take 0.5 tablets (5 mg total) by mouth daily. 01/22/19  Yes Boscia, Greer Ee, NP  furosemide (LASIX) 20 MG tablet Take 1 tablet (20 mg total) by mouth daily. 01/08/19 04/08/19 Yes End, Harrell Gave, MD  metoprolol tartrate (LOPRESSOR) 25 MG tablet Take 1 tablet (25 mg total) by mouth 2 (two) times daily. 01/08/19 04/08/19 Yes End, Harrell Gave, MD    Family History  Problem Relation Age of Onset  . Heart attack Brother 75     Social History   Tobacco Use  . Smoking status: Never Smoker  . Smokeless tobacco: Current User    Types: Snuff  Substance Use Topics  . Alcohol use: Yes    Alcohol/week: 14.0 standard drinks    Types: 14 Cans of beer per week    Comment:  pt has cut back on alcohol use  . Drug use: No    Allergies as of 02/08/2019 - Review Complete 02/08/2019  Allergen Reaction Noted  . Aspirin Other (See Comments) 05/19/2015    Review of Systems:    All systems reviewed and negative except where noted in HPI.   Physical Exam:  Vital signs in last 24 hours: Temp:  [97.7 F (36.5 C)-98.6 F (37 C)] 97.7 F (36.5 C) (10/23 0735) Pulse Rate:  [67-88] 67 (10/23 0735) Resp:  [16-18] 16 (10/23 0735) BP: (94-132)/(58-74) 94/61 (10/23 0735) SpO2:  [99 %-100 %] 100 % (10/23 0735) Last BM Date: 02/09/19 General:   Pleasant, cooperative in NAD Head:  Normocephalic and atraumatic. Eyes:   No icterus.   Conjunctiva pink. PERRLA. Ears:  Normal auditory acuity. Neck:  Supple; no masses or thyroidomegaly Lungs: Respirations even and unlabored. Lungs clear to auscultation bilaterally.   No wheezes, crackles, or rhonchi.  Heart:  Regular rate and rhythm;  Without murmur, clicks, rubs or gallops Abdomen:  Soft,  nondistended, nontender. Normal bowel sounds. No appreciable masses or hepatomegaly.  No rebound or guarding.  Rectal:  Not performed. Msk:  Symmetrical without gross deformities.    Extremities:  Without edema, cyanosis or clubbing. Neurologic:  Alert and oriented x3;  grossly normal neurologically. Skin:  Intact without significant lesions or rashes. Cervical Nodes:  No significant cervical adenopathy. Psych:  Alert and cooperative. Normal affect.  LAB RESULTS: Recent Labs    02/08/19 1551 02/08/19 2159 02/09/19 0057 02/09/19 0634 02/09/19 1214  WBC 5.6 5.4  --  4.6  --   HGB 11.7* 10.3* 10.8* 9.7* 9.4*  HCT 34.8* 30.4* 32.1* 28.6* 27.0*  PLT 43* 36*  --  29*  --    BMET Recent Labs    02/08/19 1551 02/09/19 0020 02/09/19 0634  NA 133* 129* 130*  K 2.9* 3.1* 3.5  CL 91* 91* 95*  CO2 22 26 26   GLUCOSE 108* 183* 124*  BUN 8 11 12   CREATININE 1.10* 1.17* 1.14*  CALCIUM 7.6* 7.3* 7.0*   LFT Recent Labs    02/09/19 0634  PROT 6.5  ALBUMIN 2.1*  AST 45*  ALT 18  ALKPHOS 34*  BILITOT 2.2*   PT/INR Recent Labs    02/08/19 1551  LABPROT 27.2*  INR 2.6*    STUDIES: Dg Chest 2 View  Result Date: 02/08/2019 CLINICAL DATA:  Weakness and diarrhea. EXAM: CHEST - 2 VIEW COMPARISON:  Chest x-ray 10/09/2018 FINDINGS: The cardiac silhouette, mediastinal and hilar contours are within normal limits and stable. The lungs are clear. No pleural effusion. No pneumothorax. No worrisome pulmonary lesions. The bony thorax is intact. Stable degenerative changes involving both shoulders. IMPRESSION: No acute cardiopulmonary findings. Electronically Signed   By: Marijo Sanes M.D.   On: 02/08/2019 16:21   Dg Foot 2 Views Left  Result Date: 02/08/2019 CLINICAL DATA:  Pain and swelling in the left foot for 2 months. EXAM: LEFT FOOT - 2 VIEW COMPARISON:  None. FINDINGS: Mild midfoot and hindfoot degenerative changes but no acute fracture or destructive bony changes. Calcaneal  spurring changes are noted. Mild hallux valgus deformity noted. IMPRESSION: Mild degenerative changes but no acute bony findings. Electronically Signed   By: Marijo Sanes M.D.   On: 02/08/2019 16:22   US Abdomen Limited Ruq  Result Date: 02/08/2019 CLINICAL DATA:  Hyperbilirubinemia. EXAM: ULTRASOUND ABDOMEN LIMITED RIGHT UPPER QUADRANT COMPARISON:  None. FINDINGS: Gallbladder: Gallbladder is distended. Gallbladder sludge is present. Gallbladder  wall is normal, 1.4 millimeters. No sonographic Murphy sign or pericholecystic fluid. Common bile duct: Diameter: 2.9 millimeter Liver: Heterogeneous and echogenic. Nodular surface of the liver suggests cirrhosis. No focal liver lesions. Portal vein is patent on color Doppler imaging with normal direction of blood flow towards the liver. Other: None IMPRESSION: 1. Distended gallbladder containing sludge. No evidence for acute cholecystitis. 2. Echogenic and nodular liver, consistent with cirrhosis. No focal liver lesions. Electronically Signed   By: Nolon Nations M.D.   On: 02/08/2019 17:39      Impression / Plan:   Assessment: Active Problems:   Lower extremity cellulitis   Adriana Miller is a 63 y.o. y/o female with who was admitted with lower extremity cellulitis and was found to have thrombocytopenia with diarrhea and reported rectal bleeding with the diarrhea.  The patient has findings consistent with cirrhosis and has had a history of adenomatous polyps removed in 2016.  Plan:  The patient will have her hemoglobin monitored and if she continues to drop her hemoglobin and hematocrit she may need repeat endoscopic evaluation.  The patient denies having any further signs of bleeding with diarrhea today.  Dr. Vicente Males will be taking over this weekend and can decide whether to set the patient up for a EGD and colonoscopy during the weekend or early next week.  The patient has been explained the plan and agrees with it.  Thank you for involving me in  the care of this patient.      LOS: 1 day   Adriana Lame, MD  02/09/2019, 5:47 PM Pager [phone] 7am-5pm  Check AMION for 5pm -7am coverage and on weekends   Note: This dictation was prepared with Dragon dictation along with smaller phrase technology. Any transcriptional errors that result from this process are unintentional.

## 2019-02-09 NOTE — Progress Notes (Signed)
Pharmacy Electrolyte Monitoring Consult:  Pharmacy consulted to assist in monitoring and replacing electrolytes in this 63 y.o. female admitted on 02/08/2019 with Diarrhea and Fatigue w/ h/o CKD and EtOH abuse.  Labs:  Sodium (mmol/L)  Date Value  02/09/2019 129 (L)  01/27/2018 131 (L)  10/11/2013 138   Potassium (mmol/L)  Date Value  02/09/2019 3.1 (L)  10/11/2013 3.9   Magnesium (mg/dL)  Date Value  02/09/2019 1.4 (L)   Phosphorus (mg/dL)  Date Value  02/09/2019 2.9   Calcium (mg/dL)  Date Value  02/09/2019 7.3 (L)   Calcium, Total (mg/dL)  Date Value  10/11/2013 8.4 (L)   Albumin (g/dL)  Date Value  02/09/2019 2.5 (L)  01/27/2018 4.2    Assessment/Plan: 10/23 @ 0020 K+ 3.1, Mag 1.4 will replace w/ KCI 10 mEq IV x 4 and mag 4g IV x 1 and will recheck electrolytes w/ am labs.  Tobie Lords, PharmD, BCPS Clinical Pharmacist 02/09/2019 1:52 AM

## 2019-02-09 NOTE — Progress Notes (Signed)
[place] at [place] NAME: Vivianna Schork    MR#:  [id number]  DATE OF BIRTH:  1956-02-13  SUBJECTIVE:  CHIEF COMPLAINT:   Chief Complaint  Patient presents with  . Diarrhea  . Fatigue  Patient seen and evaluated today No loose stools this morning No abdominal pain No fever no chills Decreased redness in the leg  REVIEW OF SYSTEMS:    ROS  CONSTITUTIONAL: No documented fever. has fatigue, weakness. No weight gain, no weight loss.  EYES: No blurry or double vision.  ENT: No tinnitus. No postnasal drip. No redness of the oropharynx.  RESPIRATORY: No cough, no wheeze, no hemoptysis. No dyspnea.  CARDIOVASCULAR: No chest pain. No orthopnea. No palpitations. No syncope.  GASTROINTESTINAL: No nausea, no vomiting or diarrhea. No abdominal pain. No melena or hematochezia.  GENITOURINARY: No dysuria or hematuria.  ENDOCRINE: No polyuria or nocturia. No heat or cold intolerance.  HEMATOLOGY: No anemia. No bruising. No bleeding.  INTEGUMENTARY: Decreased redness in the skin of the left leg MUSCULOSKELETAL: No arthritis. No swelling. No gout.  NEUROLOGIC: No numbness, tingling, or ataxia. No seizure-type activity.  PSYCHIATRIC: No anxiety. No insomnia. No ADD.   DRUG ALLERGIES:   Allergies  Allergen Reactions  . Aspirin Other (See Comments)    Nose bleed    VITALS:  Blood pressure 94/61, pulse 67, temperature 97.7 F (36.5 C), resp. rate 16, height 5\' 3"  (1.6 m), weight 71.7 kg, SpO2 100 %.  PHYSICAL EXAMINATION:   Physical Exam  GENERAL:  63 y.o.-year-old patient lying in the bed with no acute distress.  EYES: Pupils equal, round, reactive to light and accommodation. No scleral icterus. Extraocular muscles intact.  HEENT: Head atraumatic, normocephalic. Oropharynx and nasopharynx clear.  NECK:  Supple, no jugular venous distention. No thyroid enlargement, no tenderness.  LUNGS: Normal breath sounds bilaterally, no wheezing, rales,  rhonchi. No use of accessory muscles of respiration.  CARDIOVASCULAR: S1, S2 normal. No murmurs, rubs, or gallops.  ABDOMEN: Soft, nontender, nondistended. Bowel sounds present. No organomegaly or mass.  EXTREMITIES: No cyanosis, clubbing or edema b/l.    NEUROLOGIC: Cranial nerves II through XII are intact. No focal Motor or sensory deficits b/l.   PSYCHIATRIC: The patient is alert and oriented x 3.  SKIN: Decreased redness of the leg  LABORATORY PANEL:   CBC Recent Labs  Lab 02/09/19 0634  WBC 4.6  HGB 9.7*  HCT 28.6*  PLT 29*   ------------------------------------------------------------------------------------------------------------------ Chemistries  Recent Labs  Lab 02/09/19 0634  NA 130*  K 3.5  CL 95*  CO2 26  GLUCOSE 124*  BUN 12  CREATININE 1.14*  CALCIUM 7.0*  MG 2.7*  AST 45*  ALT 18  ALKPHOS 34*  BILITOT 2.2*   ------------------------------------------------------------------------------------------------------------------  Cardiac Enzymes No results for input(s): TROPONINI in the last 168 hours. ------------------------------------------------------------------------------------------------------------------  RADIOLOGY:  Dg Chest 2 View  Result Date: 02/08/2019 CLINICAL DATA:  Weakness and diarrhea. EXAM: CHEST - 2 VIEW COMPARISON:  Chest x-ray 10/09/2018 FINDINGS: The cardiac silhouette, mediastinal and hilar contours are within normal limits and stable. The lungs are clear. No pleural effusion. No pneumothorax. No worrisome pulmonary lesions. The bony thorax is intact. Stable degenerative changes involving both shoulders. IMPRESSION: No acute cardiopulmonary findings. Electronically Signed   By: Marijo Sanes M.D.   On: 02/08/2019 16:21   Dg Foot 2 Views Left  Result Date: 02/08/2019 CLINICAL DATA:  Pain and swelling in the left foot for 2 months. EXAM: LEFT  FOOT - 2 VIEW COMPARISON:  None. FINDINGS: Mild midfoot and hindfoot degenerative changes  but no acute fracture or destructive bony changes. Calcaneal spurring changes are noted. Mild hallux valgus deformity noted. IMPRESSION: Mild degenerative changes but no acute bony findings. Electronically Signed   By: Marijo Sanes M.D.   On: 02/08/2019 16:22   US Abdomen Limited Ruq  Result Date: 02/08/2019 CLINICAL DATA:  Hyperbilirubinemia. EXAM: ULTRASOUND ABDOMEN LIMITED RIGHT UPPER QUADRANT COMPARISON:  None. FINDINGS: Gallbladder: Gallbladder is distended. Gallbladder sludge is present. Gallbladder wall is normal, 1.4 millimeters. No sonographic Murphy sign or pericholecystic fluid. Common bile duct: Diameter: 2.9 millimeter Liver: Heterogeneous and echogenic. Nodular surface of the liver suggests cirrhosis. No focal liver lesions. Portal vein is patent on color Doppler imaging with normal direction of blood flow towards the liver. Other: None IMPRESSION: 1. Distended gallbladder containing sludge. No evidence for acute cholecystitis. 2. Echogenic and nodular liver, consistent with cirrhosis. No focal liver lesions. Electronically Signed   By: Nolon Nations M.D.   On: 02/08/2019 17:39     ASSESSMENT AND PLAN:   63 year old female patient with history of alcohol abuse in the past, chronic kidney disease stage III type 2 diabetes mellitus, hypertension, hyperlipidemia, seizure disorder currently under hospitalist service  -Left leg cellulitis Improving Continue IV Rocephin antibiotic No evidence of osteomyelitis on x-ray  -Acute enteritis With hematochezia Follow-up stool for C. difficile toxin, GI panel and norovirus Hemoglobin appears to be stable so far, current hemoglobin 9.7 Enteric precautions Gastroenterology consult Clear liquid diet  -Hypokalemia Replaced  -Thrombocytopenia Probably secondary cirrhosis of liver Ultrasound of the abdomen reviewed shows cirrhosis of liver Gallbladder distended with sludge but no evidence of cholecystitis Eliquis on hold Monitor  platelet count No spontaneous bleeding noted  -History of alcohol abuse On CIWA protocol IV fluids with banana bag  -DVT prophylaxis with sequential compression device to lower extremities  All the records are reviewed and case discussed with Care Management/Social Worker. Management plans discussed with the patient, family and they are in agreement.  CODE STATUS: Full code  DVT Prophylaxis: SCDs  TOTAL TIME TAKING CARE OF THIS PATIENT: 40 minutes.   POSSIBLE D/C IN 2 to 3 DAYS, DEPENDING ON CLINICAL CONDITION.  Saundra Shelling M.D on 02/09/2019 at 10:46 AM  Between 7am to 6pm - Pager - [phone]  After 6pm go to [email or web address] - password [email or web address] [place] [facility]  Office  [phone]  CC: Primary care physician; Ronnell Freshwater, NP  Note: This dictation was prepared with Dragon dictation along with smaller phrase technology. Any transcriptional errors that result from this process are unintentional.

## 2019-02-10 DIAGNOSIS — K921 Melena: Principal | ICD-10-CM

## 2019-02-10 LAB — BASIC METABOLIC PANEL
Anion gap: 10 (ref 5–15)
BUN: 13 mg/dL (ref 8–23)
CO2: 24 mmol/L (ref 22–32)
Calcium: 7.4 mg/dL — ABNORMAL LOW (ref 8.9–10.3)
Chloride: 100 mmol/L (ref 98–111)
Creatinine, Ser: 1.02 mg/dL — ABNORMAL HIGH (ref 0.44–1.00)
GFR calc Af Amer: >60 mL/min (ref >60)
GFR calc non Af Amer: 58 mL/min — ABNORMAL LOW (ref >60)
Glucose, Bld: 82 mg/dL (ref 70–99)
Potassium: 3.5 mmol/L (ref 3.5–5.1)
Sodium: 134 mmol/L — ABNORMAL LOW (ref 135–145)

## 2019-02-10 LAB — CBC
HCT: 28.3 % — ABNORMAL LOW (ref 36.0–46.0)
Hemoglobin: 9.5 g/dL — ABNORMAL LOW (ref 12.0–15.0)
MCH: 32.8 pg (ref 26.0–34.0)
MCHC: 33.6 g/dL (ref 30.0–36.0)
MCV: 97.6 fL (ref 80.0–100.0)
Platelets: 40 10*3/uL — ABNORMAL LOW (ref 150–400)
RBC: 2.9 MIL/uL — ABNORMAL LOW (ref 3.87–5.11)
RDW: 14.1 % (ref 11.5–15.5)
WBC: 3.5 10*3/uL — ABNORMAL LOW (ref 4.0–10.5)
nRBC: 0 % (ref 0.0–0.2)

## 2019-02-10 LAB — MAGNESIUM: Magnesium: 2.3 mg/dL (ref 1.7–2.4)

## 2019-02-10 MED ORDER — METOPROLOL TARTRATE 25 MG PO TABS
25.0000 mg | ORAL_TABLET | Freq: Two times a day (BID) | ORAL | Status: DC
  Administered 2019-02-10: 10:00:00 25 mg via ORAL
  Filled 2019-02-10: qty 1

## 2019-02-10 MED ORDER — SODIUM CHLORIDE 0.9 % IV SOLN
INTRAVENOUS | Status: DC
  Administered 2019-02-12: 06:00:00 via INTRAVENOUS

## 2019-02-10 MED ORDER — CEPHALEXIN 500 MG PO CAPS
500.0000 mg | ORAL_CAPSULE | Freq: Two times a day (BID) | ORAL | Status: DC
  Administered 2019-02-10 (×2): 500 mg via ORAL
  Filled 2019-02-10 (×2): qty 1

## 2019-02-10 MED ORDER — DILTIAZEM HCL 30 MG PO TABS
30.0000 mg | ORAL_TABLET | Freq: Three times a day (TID) | ORAL | Status: DC
  Administered 2019-02-10: 30 mg via ORAL
  Filled 2019-02-10 (×2): qty 1

## 2019-02-10 MED ORDER — DILTIAZEM HCL 25 MG/5ML IV SOLN
20.0000 mg | Freq: Once | INTRAVENOUS | Status: AC
  Administered 2019-02-10: 20 mg via INTRAVENOUS
  Filled 2019-02-10 (×3): qty 5

## 2019-02-10 MED ORDER — PEG 3350-KCL-NA BICARB-NACL 420 G PO SOLR
4000.0000 mL | Freq: Once | ORAL | Status: AC
  Administered 2019-02-11: 4000 mL via ORAL
  Filled 2019-02-10: qty 4000

## 2019-02-10 MED ORDER — METOPROLOL TARTRATE 50 MG PO TABS
50.0000 mg | ORAL_TABLET | Freq: Two times a day (BID) | ORAL | Status: DC
  Administered 2019-02-10 – 2019-02-13 (×6): 50 mg via ORAL
  Filled 2019-02-10 (×6): qty 1

## 2019-02-10 MED ORDER — ADULT MULTIVITAMIN W/MINERALS CH
1.0000 | ORAL_TABLET | Freq: Every day | ORAL | Status: DC
Start: 2019-02-10 — End: 2019-02-13
  Administered 2019-02-10 – 2019-02-13 (×3): 1 via ORAL
  Filled 2019-02-10 (×4): qty 1

## 2019-02-10 NOTE — Progress Notes (Signed)
Pharmacy Electrolyte Monitoring Consult:  Pharmacy consulted to assist in monitoring and replacing electrolytes in this 63 y.o. female admitted on 02/08/2019 with Diarrhea and fatigue. Pt w/ h/o CKD and EtOH abuse.  Labs:  Sodium (mmol/L)  Date Value  02/10/2019 134 (L)  01/27/2018 131 (L)  10/11/2013 138   Potassium (mmol/L)  Date Value  02/10/2019 3.5  10/11/2013 3.9   Magnesium (mg/dL)  Date Value  02/10/2019 2.3   Phosphorus (mg/dL)  Date Value  02/09/2019 2.9   Calcium (mg/dL)  Date Value  02/10/2019 7.4 (L)   Calcium, Total (mg/dL)  Date Value  10/11/2013 8.4 (L)   Albumin (g/dL)  Date Value  02/09/2019 2.1 (L)  01/27/2018 4.2   Corrected Calcium: 8.52  Assessment/Plan: K: 3.5 , Mg: 2.3. No replacement needed at this time.   Will F/U with AM labs and continue to replace electrolytes as needed.   Olivia Canter [facility] Clinical Pharmacist 02/10/2019 7:34 AM

## 2019-02-10 NOTE — Progress Notes (Signed)
[place] at [place] NAME: Adriana Miller    MR#:  [id number]  DATE OF BIRTH:  17-Mar-1956  SUBJECTIVE:  CHIEF COMPLAINT:   Chief Complaint  Patient presents with  . Diarrhea  . Fatigue  Patient seen and evaluated today No loose stools this morning No abdominal pain No fever no chills Decreased redness in the leg  REVIEW OF SYSTEMS:    Review of Systems  Constitutional: Negative for chills, fever and weight loss.  HENT: Negative for ear discharge, ear pain and nosebleeds.   Eyes: Negative for blurred vision, pain and discharge.  Respiratory: Negative for sputum production, shortness of breath, wheezing and stridor.   Cardiovascular: Negative for chest pain, palpitations, orthopnea and PND.  Gastrointestinal: Negative for abdominal pain, diarrhea, nausea and vomiting.  Genitourinary: Negative for frequency and urgency.  Musculoskeletal: Positive for falls and joint pain. Negative for back pain.  Neurological: Positive for weakness. Negative for sensory change, speech change and focal weakness.  Psychiatric/Behavioral: Negative for depression and hallucinations. The patient is not nervous/anxious.     DRUG ALLERGIES:   Allergies  Allergen Reactions  . Aspirin Other (See Comments)    Nose bleed    VITALS:  Blood pressure (!) 145/98, pulse (!) 118, temperature 98.5 F (36.9 C), temperature source Oral, resp. rate 18, height 5\' 3"  (1.6 m), weight 71.7 kg, SpO2 99 %.  PHYSICAL EXAMINATION:   Physical Exam  GENERAL:  63 y.o.-year-old patient lying in the bed with no acute distress.  EYES: Pupils equal, round, reactive to light and accommodation. No scleral icterus. Extraocular muscles intact.  HEENT: Head atraumatic, normocephalic. Oropharynx and nasopharynx clear.  NECK:  Supple, no jugular venous distention. No thyroid enlargement, no tenderness.  LUNGS: Normal breath sounds bilaterally, no wheezing, rales, rhonchi. No use of  accessory muscles of respiration.  CARDIOVASCULAR: S1, S2 normal. No murmurs, rubs, or gallops. irregularly irregular ABDOMEN: Soft, nontender, nondistended. Bowel sounds present. No organomegaly or mass.  EXTREMITIES: No cyanosis, clubbing , mild ankle edema b/l.  No Cellulitis  NEUROLOGIC: Cranial nerves II through XII are intact. No focal Motor or sensory deficits b/l.   PSYCHIATRIC: The patient is alert and oriented x 3.  SKIN: Decreased redness of the leg  LABORATORY PANEL:   CBC Recent Labs  Lab 02/10/19 0430  WBC 3.5*  HGB 9.5*  HCT 28.3*  PLT 40*   ------------------------------------------------------------------------------------------------------------------ Chemistries  Recent Labs  Lab 02/09/19 0634 02/10/19 0430  NA 130* 134*  K 3.5 3.5  CL 95* 100  CO2 26 24  GLUCOSE 124* 82  BUN 12 13  CREATININE 1.14* 1.02*  CALCIUM 7.0* 7.4*  MG 2.7* 2.3  AST 45*  --   ALT 18  --   ALKPHOS 34*  --   BILITOT 2.2*  --    ------------------------------------------------------------------------------------------------------------------  Cardiac Enzymes No results for input(s): TROPONINI in the last 168 hours. ------------------------------------------------------------------------------------------------------------------  RADIOLOGY:  Dg Chest 2 View  Result Date: 02/08/2019 CLINICAL DATA:  Weakness and diarrhea. EXAM: CHEST - 2 VIEW COMPARISON:  Chest x-ray 10/09/2018 FINDINGS: The cardiac silhouette, mediastinal and hilar contours are within normal limits and stable. The lungs are clear. No pleural effusion. No pneumothorax. No worrisome pulmonary lesions. The bony thorax is intact. Stable degenerative changes involving both shoulders. IMPRESSION: No acute cardiopulmonary findings. Electronically Signed   By: Marijo Sanes M.D.   On: 02/08/2019 16:21   Dg Foot 2 Views Left  Result Date: 02/08/2019 CLINICAL DATA:  Pain and swelling in the left foot for 2 months.  EXAM: LEFT FOOT - 2 VIEW COMPARISON:  None. FINDINGS: Mild midfoot and hindfoot degenerative changes but no acute fracture or destructive bony changes. Calcaneal spurring changes are noted. Mild hallux valgus deformity noted. IMPRESSION: Mild degenerative changes but no acute bony findings. Electronically Signed   By: Marijo Sanes M.D.   On: 02/08/2019 16:22   US Abdomen Limited Ruq  Result Date: 02/08/2019 CLINICAL DATA:  Hyperbilirubinemia. EXAM: ULTRASOUND ABDOMEN LIMITED RIGHT UPPER QUADRANT COMPARISON:  None. FINDINGS: Gallbladder: Gallbladder is distended. Gallbladder sludge is present. Gallbladder wall is normal, 1.4 millimeters. No sonographic Murphy sign or pericholecystic fluid. Common bile duct: Diameter: 2.9 millimeter Liver: Heterogeneous and echogenic. Nodular surface of the liver suggests cirrhosis. No focal liver lesions. Portal vein is patent on color Doppler imaging with normal direction of blood flow towards the liver. Other: None IMPRESSION: 1. Distended gallbladder containing sludge. No evidence for acute cholecystitis. 2. Echogenic and nodular liver, consistent with cirrhosis. No focal liver lesions. Electronically Signed   By: Nolon Nations M.D.   On: 02/08/2019 17:39     ASSESSMENT AND PLAN:   63 year old female patient with history of alcohol abuse in the past, chronic kidney disease stage III type 2 diabetes mellitus, hypertension, hyperlipidemia, seizure disorder currently under hospitalist service  *Left leg cellulitis resolved Continue IV Rocephin antibiotic--change to po keflex No evidence of osteomyelitis on x-ray  *Acute enteritis With hematochezia--resolved. Pt has not had any BM since admisison Follow-up stool for C. difficile toxin, GI panel and norovirus -Hemoglobin appears to be stable so far, current hemoglobin 9.7 -Gastroenterology consult appreciated -Clear liquid diet--advance to regular  *Hypokalemia Replaced  *Pancytopenia/Thrombocytopenia  Probably secondary cirrhosis of liver -Ultrasound of the abdomen reviewed shows cirrhosis of liver with h/o chronic ETOH abuse. Pt advised abstinence -Gallbladder distended with sludge but no evidence of cholecystitis -Eliquis on hold-- for now till followed as out pt by PCP or cardiology -No spontaneous bleeding noted  *History of alcohol abuse -On CIWA protocol--not in WD at present. -advised ETOH cessation -MVI  *Persisitent Chronic afib -resume Metoprolol -holding eliquis for now with mild bloody stools int he setting of Chronic TCP. Pt advised to f/u pcp or cardiology before resuming eliquis  *DVT prophylaxis with sequential compression device to lower extremities  * PT to see pt  Anticipate discharged either today or tomorrow pending physical therapy. Patient agreeable with plan  CODE STATUS: Full code  DVT Prophylaxis: SCDs  TOTAL TIME TAKING CARE OF THIS PATIENT: 40 minutes.   POSSIBLE D/C IN 1 DAYS, DEPENDING ON CLINICAL CONDITION.  Fritzi Mandes M.D on 02/10/2019 at 8:29 AM  Between 7am to 6pm - Pager - [phone]  After 6pm go to [email or web address] - password [email or web address] [facility]  Office  [phone]  CC: Primary care physician; Ronnell Freshwater, NP  Note: This dictation was prepared with Dragon dictation along with smaller phrase technology. Any transcriptional errors that result from this process are unintentional.

## 2019-02-10 NOTE — Progress Notes (Signed)
D: Pt alert and oriented.  Pt denies experiencing any pain at this time. Physician discontinued enteric precautions. Pt OOB with PT which was well tolerated. Pt's HR varies w/movement. Pt is on tele. Pt's HR is sustaining in the 130's even after meds given, physician notified. Physician placed orders to transfer to 2A. 2A requested other measures be taken first before transferring, states they only have 1 bed available, physician notified of situation.  A: Scheduled medications administered to pt, per MD orders. Support and encouragement provided. Frequent verbal contact made.  R: No adverse drug reactions noted. Pt complaint with medications and treatment plan. Pt interacts well with staff on the unit.  Pt continuously monitored and care provided as ordered.

## 2019-02-10 NOTE — Evaluation (Signed)
Physical Therapy Evaluation Patient Details Name: Adriana Miller MRN: [id number] DOB: 1956/04/14 Today's Date: 02/10/2019   History of Present Illness  pt is a 63 yo female admitted after presenting to ED with chief complaint of left foot redness and pain and diarrhea. PMH includes alcohol abuse, seizure disorder, CKD, DM, HTN, cerebral aneurysm  Clinical Impression  Pt is a 63 yo female admitted for above. Pt in bed upon arrival with nursing taking vitals and providing medications and agreed to participate with PT. Pt complained of left foot pain stating she didn't think she could walk on it and shes been in the bed the last four days prior to hospital admission because she could not get out of bed due to the pain. Pt presents with decreased strength, balance and activity tolerance limiting functional mobility. Pt reports having multiple falls recently secondary to weakness and decreased balance. Pt required mod A to rise from bed with pt reporting she cannot stand from low surfaces without assistance. Pt ambulated in room to recliner with HR increasing and nursing present in room. Further exercise and activity withheld secondary to high HR. Pt denied any chest discomfort or palpitations. Pt reports she has been talking with her brother about getting her an aide to help her during the day. pts brother only able to come a few times a week to assist her. Pt reports getting meals on wheels on Saturday but prior to Covid-19 pandemic was getting meals on wheels everyday. Pt will benefit from skilled acute therapy to improve deficits noted. Pt would benefit from home health PT following hospital discharge in order to further improve deficits, reduce fall risk and allow for return to PLOF. Pt educated on recommendation and agreed.     Follow Up Recommendations Home health PT    Equipment Recommendations  3in1 (PT)    Recommendations for Other Services       Precautions / Restrictions  Precautions Precautions: Fall Restrictions Weight Bearing Restrictions: No      Mobility  Bed Mobility Overal bed mobility: Needs Assistance Bed Mobility: Supine to Sit     Supine to sit: Min guard;HOB elevated     General bed mobility comments: pt able to get EOB without physical assist but utilized bed rails, HOB elevated, increased time and effort  Transfers Overall transfer level: Needs assistance Equipment used: Rolling walker (2 wheeled) Transfers: Sit to/from Stand Sit to Stand: Mod assist;Min assist;From elevated surface         General transfer comment: pt required mod A to rise from bed with rocking three times for momentum, pt required VC for hand placement, pt steady with initial standing with heavy reliance on UE support and requested to sit back down and rest, pt required min A to rise from elevated surface on second trial  Ambulation/Gait Ambulation/Gait assistance: Min guard Gait Distance (Feet): 15 Feet Assistive device: Rolling walker (2 wheeled) Gait Pattern/deviations: Step-to pattern;Antalgic;Trunk flexed;Decreased stride length;Decreased stance time - left;Decreased weight shift to left Gait velocity: decreased   General Gait Details: pt ambulated in room around bed to recliner, pt antalgic, slow and cautious with no buckling, unsteadiness or LOB, pt reported pain in left foot throughout ambulation but able to continue talking throughout  Stairs            Wheelchair Mobility    Modified Rankin (Stroke Patients Only)       Balance Overall balance assessment: Needs assistance;History of Falls   Sitting balance-Leahy Scale: Good Sitting balance -  Comments: steady sitting EOB, pt able to put sock on one foot required assistance to don sock on L foot     Standing balance-Leahy Scale: Poor Standing balance comment: reliant on UE support                             Pertinent Vitals/Pain Pain Assessment: Faces Faces Pain  Scale: Hurts whole lot Pain Location: L foot Pain Descriptors / Indicators: Aching;Sore;Discomfort Pain Intervention(s): Limited activity within patient's tolerance;Monitored during session    Home Living Family/patient expects to be discharged to:: Private residence Living Arrangements: Alone Available Help at Discharge: Family;Available PRN/intermittently(brother comes a few days a week to check on her, help with meal prep and housework) Type of Home: Apartment Home Access: Level entry     Home Layout: One level Home Equipment: Grab bars - toilet;Walker - 4 wheels;Walker - 2 wheels      Prior Function Level of Independence: Needs assistance   Gait / Transfers Assistance Needed: pt reports her brother assists with meal prep and cleaning the house, pt reports she was ambulating with RW but last 4 days prior to hospital admission was bed bound secondary to foot pain and had to have her brother assist her to get out of bed  ADL's / Homemaking Assistance Needed: pt reports not really showering especially when no one home because she is scared of falling, mostly just washes up  Comments: pt reports 4 falls in last 6 months, one fall was when she lost her balance pulling up her pull up     Hand Dominance        Extremity/Trunk Assessment   Upper Extremity Assessment Upper Extremity Assessment: Generalized weakness    Lower Extremity Assessment Lower Extremity Assessment: Generalized weakness(strength grossly 4/5)    Cervical / Trunk Assessment Cervical / Trunk Assessment: Kyphotic  Communication   Communication: No difficulties  Cognition Arousal/Alertness: Awake/alert Behavior During Therapy: WFL for tasks assessed/performed Overall Cognitive Status: Within Functional Limits for tasks assessed                                        General Comments      Exercises     Assessment/Plan    PT Assessment Patient needs continued PT services  PT Problem  List Decreased strength;Decreased mobility;Decreased safety awareness;Decreased activity tolerance;Decreased balance;Decreased knowledge of use of DME;Pain       PT Treatment Interventions DME instruction;Therapeutic exercise;Gait training;Balance training;Stair training;Neuromuscular re-education;Functional mobility training;Therapeutic activities;Patient/family education    PT Goals (Current goals can be found in the Care Plan section)  Acute Rehab PT Goals Patient Stated Goal: return home PT Goal Formulation: With patient Time For Goal Achievement: 02/24/19 Potential to Achieve Goals: Good    Frequency Min 2X/week   Barriers to discharge Decreased caregiver support      Co-evaluation               AM-PAC PT "6 Clicks" Mobility  Outcome Measure Help needed turning from your back to your side while in a flat bed without using bedrails?: A Little Help needed moving from lying on your back to sitting on the side of a flat bed without using bedrails?: A Little Help needed moving to and from a bed to a chair (including a wheelchair)?: A Little Help needed standing up from a chair using your arms (  e.g., wheelchair or bedside chair)?: A Little Help needed to walk in hospital room?: A Little Help needed climbing 3-5 steps with a railing? : A Lot 6 Click Score: 17    End of Session Equipment Utilized During Treatment: Gait belt Activity Tolerance: Patient limited by fatigue;Treatment limited secondary to medical complications (Comment)(increased HR during ambulation) Patient left: in chair;with call bell/phone within reach;with chair alarm set Nurse Communication: Mobility status PT Visit Diagnosis: Muscle weakness (generalized) (M62.81);Difficulty in walking, not elsewhere classified (R26.2);Unsteadiness on feet (R26.81);Repeated falls (R29.6)    Time: [id number] PT Time Calculation (min) (ACUTE ONLY): 37 min   Charges:   PT Evaluation $PT Eval Low Complexity: 1 Low          Hayes Czaja PT, DPT 12:18 PM,02/10/19 [phone]

## 2019-02-10 NOTE — Progress Notes (Signed)
Jonathon Bellows , MD [address], [place], [place], [place], [address] [address] [place], [place], [place], [address] Phone: [phone]  Fax: [phone]   Adriana Miller is being followed for diarrhea,GI bleed  Day 1 of follow up   Subjective: No bowel movements today .    Objective: Vital signs in last 24 hours: Vitals:   02/09/19 0735 02/09/19 2301 02/10/19 0816 02/10/19 0937  BP: 94/61 109/68 (!) 145/98 107/69  Pulse: 67 68 (!) 118 (!) 113  Resp: 16 19 18    Temp: 97.7 F (36.5 C) 98.2 F (36.8 C) 98.5 F (36.9 C)   TempSrc:  Oral Oral   SpO2: 100% 99% 99% 100%  Weight:      Height:       Weight change:   Intake/Output Summary (Last 24 hours) at 02/10/2019 1118 Last data filed at 02/10/2019 0400 Gross per 24 hour  Intake 100 ml  Output 300 ml  Net -200 ml     Exam: Heart:: Regular rate and rhythm, S1S2 present or without murmur or extra heart sounds Lungs: normal, clear to auscultation and clear to auscultation and percussion Abdomen: soft, nontender, normal bowel sounds   Lab Results: @LABTEST2 @ Micro Results: Recent Results (from the past 240 hour(s))  SARS CORONAVIRUS 2 (TAT 6-24 HRS) Nasopharyngeal Nasopharyngeal Swab     Status: None   Collection Time: 02/08/19  6:53 PM   Specimen: Nasopharyngeal Swab  Result Value Ref Range Status   SARS Coronavirus 2 NEGATIVE NEGATIVE Final    Comment: (NOTE) SARS-CoV-2 target nucleic acids are NOT DETECTED. The SARS-CoV-2 RNA is generally detectable in upper and lower respiratory specimens during the acute phase of infection. Negative results do not preclude SARS-CoV-2 infection, do not rule out co-infections with other pathogens, and should not be used as the sole basis for treatment or other patient management decisions. Negative results must be combined with clinical observations, patient history, and epidemiological information. The expected result is Negative. Fact Sheet for  Patients: SugarRoll.be Fact Sheet for Healthcare Providers: https://www.woods-mathews.com/ This test is not yet approved or cleared by the [place] FDA and  has been authorized for detection and/or diagnosis of SARS-CoV-2 by FDA under an Emergency Use Authorization (EUA). This EUA will remain  in effect (meaning this test can be used) for the duration of the COVID-19 declaration under Section 56 4(b)(1) of the Act, 21 U.S.C. section 360bbb-3(b)(1), unless the authorization is terminated or revoked sooner. Performed at [facility], [place] [address] [place], [place] [address]    Studies/Results: Dg Chest 2 View  Result Date: 02/08/2019 CLINICAL DATA:  Weakness and diarrhea. EXAM: CHEST - 2 VIEW COMPARISON:  Chest x-ray 10/09/2018 FINDINGS: The cardiac silhouette, mediastinal and hilar contours are within normal limits and stable. The lungs are clear. No pleural effusion. No pneumothorax. No worrisome pulmonary lesions. The bony thorax is intact. Stable degenerative changes involving both shoulders. IMPRESSION: No acute cardiopulmonary findings. Electronically Signed   By: Marijo Sanes M.D.   On: 02/08/2019 16:21   Dg Foot 2 Views Left  Result Date: 02/08/2019 CLINICAL DATA:  Pain and swelling in the left foot for 2 months. EXAM: LEFT FOOT - 2 VIEW COMPARISON:  None. FINDINGS: Mild midfoot and hindfoot degenerative changes but no acute fracture or destructive bony changes. Calcaneal spurring changes are noted. Mild hallux valgus deformity noted. IMPRESSION: Mild degenerative changes but no acute bony findings. Electronically Signed   By: Ricky Stabs.D.  On: 02/08/2019 16:22   US Abdomen Limited Ruq  Result Date: 02/08/2019 CLINICAL DATA:  Hyperbilirubinemia. EXAM: ULTRASOUND ABDOMEN LIMITED RIGHT UPPER QUADRANT COMPARISON:  None. FINDINGS: Gallbladder: Gallbladder is distended. Gallbladder sludge is present. Gallbladder wall is  normal, 1.4 millimeters. No sonographic Murphy sign or pericholecystic fluid. Common bile duct: Diameter: 2.9 millimeter Liver: Heterogeneous and echogenic. Nodular surface of the liver suggests cirrhosis. No focal liver lesions. Portal vein is patent on color Doppler imaging with normal direction of blood flow towards the liver. Other: None IMPRESSION: 1. Distended gallbladder containing sludge. No evidence for acute cholecystitis. 2. Echogenic and nodular liver, consistent with cirrhosis. No focal liver lesions. Electronically Signed   By: Nolon Nations M.D.   On: 02/08/2019 17:39   Medications: I have reviewed the patient's current medications. Scheduled Meds: . cephALEXin  500 mg Oral Q12H  . metoprolol tartrate  25 mg Oral BID  . multivitamin with minerals  1 tablet Oral Daily  . pantoprazole (PROTONIX) IV  40 mg Intravenous Q12H   Continuous Infusions: PRN Meds:.acetaminophen **OR** acetaminophen, LORazepam **OR** LORazepam   Assessment: Active Problems:   Lower extremity cellulitis   Alcoholic cirrhosis of liver without ascites (HCC)   Blood in stool   Adriana Miller 63 y.o. female admitted with a history of diarrhea and some blood in her stool . H/o excess alcohol consumption. On Eloquis. Since admission no diarrhea or rectal bleeding. Hb has been stable. The platelet count has been on the lower side. IF we did pursue a colonoscopy and found a large polyp or lesion that needs excision - there is increased risk of bleeding.  Watch today to see if has any more bleeding or not. IF stable can plan for colonoscopy on Monday off eloquis  , if large polyp seen - would not be excised - may need to be repeated after administering her "Doptolet" which wont help increase the platelet count before a polypectomy .  I have discussed alternative options, risks & benefits,  which include, but are not limited to, bleeding, infection, perforation,respiratory complication & drug reaction.  The  patient agrees with this plan & written consent will be obtained.       LOS: 2 days   Jonathon Bellows, MD 02/10/2019, 11:18 AM

## 2019-02-11 DIAGNOSIS — D649 Anemia, unspecified: Secondary | ICD-10-CM

## 2019-02-11 DIAGNOSIS — K7030 Alcoholic cirrhosis of liver without ascites: Secondary | ICD-10-CM

## 2019-02-11 DIAGNOSIS — K921 Melena: Principal | ICD-10-CM

## 2019-02-11 LAB — FOLATE: Folate: 9.7 ng/mL (ref >5.9)

## 2019-02-11 LAB — IRON AND TIBC
Iron: 76 ug/dL (ref 28–170)
Saturation Ratios: 62 % — ABNORMAL HIGH (ref 10.4–31.8)
TIBC: 122 ug/dL — ABNORMAL LOW (ref 250–450)
UIBC: 46 ug/dL

## 2019-02-11 LAB — MAGNESIUM: Magnesium: 2 mg/dL (ref 1.7–2.4)

## 2019-02-11 LAB — FERRITIN: Ferritin: 427 ng/mL — ABNORMAL HIGH (ref 11–307)

## 2019-02-11 LAB — POTASSIUM: Potassium: 3.4 mmol/L — ABNORMAL LOW (ref 3.5–5.1)

## 2019-02-11 LAB — VITAMIN B12: Vitamin B-12: 968 pg/mL — ABNORMAL HIGH (ref 180–914)

## 2019-02-11 MED ORDER — CEPHALEXIN 500 MG PO CAPS
500.0000 mg | ORAL_CAPSULE | Freq: Two times a day (BID) | ORAL | Status: AC
  Administered 2019-02-11 – 2019-02-12 (×3): 500 mg via ORAL
  Filled 2019-02-11 (×4): qty 1

## 2019-02-11 MED ORDER — VITAMIN B-1 100 MG PO TABS
100.0000 mg | ORAL_TABLET | Freq: Every day | ORAL | Status: DC
  Administered 2019-02-11 – 2019-02-13 (×2): 100 mg via ORAL
  Filled 2019-02-11 (×3): qty 1

## 2019-02-11 MED ORDER — POTASSIUM CHLORIDE CRYS ER 20 MEQ PO TBCR
20.0000 meq | EXTENDED_RELEASE_TABLET | Freq: Once | ORAL | Status: DC
  Filled 2019-02-11: qty 1

## 2019-02-11 MED ORDER — DILTIAZEM HCL 60 MG PO TABS
60.0000 mg | ORAL_TABLET | Freq: Two times a day (BID) | ORAL | Status: DC
  Administered 2019-02-11 – 2019-02-13 (×4): 60 mg via ORAL
  Filled 2019-02-11 (×2): qty 1
  Filled 2019-02-11 (×2): qty 2
  Filled 2019-02-11: qty 1
  Filled 2019-02-11 (×2): qty 2
  Filled 2019-02-11 (×2): qty 1
  Filled 2019-02-11: qty 2
  Filled 2019-02-11: qty 1
  Filled 2019-02-11: qty 2

## 2019-02-11 MED ORDER — POTASSIUM CHLORIDE 20 MEQ PO PACK
20.0000 meq | PACK | Freq: Two times a day (BID) | ORAL | Status: DC
  Administered 2019-02-11 (×2): 20 meq via ORAL
  Filled 2019-02-11 (×2): qty 1

## 2019-02-11 NOTE — Progress Notes (Signed)
Adriana Darby, MD [address]  [place]  [place], [place] [address]  Main: [phone]  Fax: [phone] Pager: [phone]   Subjective: No complaints, tired of consuming liquids only   Objective: Vital signs in last 24 hours: Vitals:   02/11/19 0528 02/11/19 0741 02/11/19 1008 02/11/19 1223  BP: 108/70 111/72 139/79 135/69  Pulse: 78 88 94 85  Resp: 18 16    Temp: 98.4 F (36.9 C)     TempSrc: Oral     SpO2: 99% 99% 97% 99%  Weight:      Height:       Weight change:  No intake or output data in the 24 hours ending 02/11/19 1506   Exam: Heart:: Regular rate and rhythm, S1S2 present or without murmur or extra heart sounds Lungs: normal and clear to auscultation Abdomen: soft, nontender, normal bowel sounds   Lab Results: CBC Latest Ref Rng & Units 02/10/2019 02/09/2019 02/09/2019  WBC 4.0 - 10.5 K/uL 3.5(L) - 4.6  Hemoglobin 12.0 - 15.0 g/dL 9.5(L) 9.4(L) 9.7(L)  Hematocrit 36.0 - 46.0 % 28.3(L) 27.0(L) 28.6(L)  Platelets 150 - 400 K/uL 40(L) - 29(LL)   CMP Latest Ref Rng & Units 02/11/2019 02/10/2019 02/09/2019  Glucose 70 - 99 mg/dL - 82 124(H)  BUN 8 - 23 mg/dL - 13 12  Creatinine 0.44 - 1.00 mg/dL - 1.02(H) 1.14(H)  Sodium 135 - 145 mmol/L - 134(L) 130(L)  Potassium 3.5 - 5.1 mmol/L 3.4(L) 3.5 3.5  Chloride 98 - 111 mmol/L - 100 95(L)  CO2 22 - 32 mmol/L - 24 26  Calcium 8.9 - 10.3 mg/dL - 7.4(L) 7.0(L)  Total Protein 6.5 - 8.1 g/dL - - 6.5  Total Bilirubin 0.3 - 1.2 mg/dL - - 2.2(H)  Alkaline Phos 38 - 126 U/L - - 34(L)  AST 15 - 41 U/L - - 45(H)  ALT 0 - 44 U/L - - 18    Micro Results: Recent Results (from the past 240 hour(s))  SARS CORONAVIRUS 2 (TAT 6-24 HRS) Nasopharyngeal Nasopharyngeal Swab     Status: None   Collection Time: 02/08/19  6:53 PM   Specimen: Nasopharyngeal Swab  Result Value Ref Range Status   SARS Coronavirus 2 NEGATIVE NEGATIVE Final    Comment: (NOTE) SARS-CoV-2 target nucleic acids are NOT DETECTED. The  SARS-CoV-2 RNA is generally detectable in upper and lower respiratory specimens during the acute phase of infection. Negative results do not preclude SARS-CoV-2 infection, do not rule out co-infections with other pathogens, and should not be used as the sole basis for treatment or other patient management decisions. Negative results must be combined with clinical observations, patient history, and epidemiological information. The expected result is Negative. Fact Sheet for Patients: SugarRoll.be Fact Sheet for Healthcare Providers: https://www.woods-mathews.com/ This test is not yet approved or cleared by the [place] FDA and  has been authorized for detection and/or diagnosis of SARS-CoV-2 by FDA under an Emergency Use Authorization (EUA). This EUA will remain  in effect (meaning this test can be used) for the duration of the COVID-19 declaration under Section 56 4(b)(1) of the Act, 21 U.S.C. section 360bbb-3(b)(1), unless the authorization is terminated or revoked sooner. Performed at [facility], [place] [address], [place], [place] [address]    Studies/Results: No results found. Medications:  I have reviewed the patient's current medications. Prior to Admission:  Medications Prior to Admission  Medication Sig Dispense Refill Last Dose  . apixaban (ELIQUIS) 5 MG TABS  tablet Take 1 tablet (5 mg total) by mouth 2 (two) times daily. 60 tablet 6 02/08/2019 at 1430  . enalapril (VASOTEC) 10 MG tablet Take 0.5 tablets (5 mg total) by mouth daily. 30 tablet 2 02/08/2019 at 1430  . furosemide (LASIX) 20 MG tablet Take 1 tablet (20 mg total) by mouth daily. 30 tablet 2 02/08/2019 at 1430  . metoprolol tartrate (LOPRESSOR) 25 MG tablet Take 1 tablet (25 mg total) by mouth 2 (two) times daily. 60 tablet 2 02/08/2019 at 1430   Scheduled: . cephALEXin  500 mg Oral Q12H  . diltiazem  60 mg Oral Q12H  . metoprolol tartrate  50 mg Oral BID  .  multivitamin with minerals  1 tablet Oral Daily  . pantoprazole (PROTONIX) IV  40 mg Intravenous Q12H  . polyethylene glycol-electrolytes  4,000 mL Oral Once  . potassium chloride  20 mEq Oral BID  . thiamine  100 mg Oral Daily   Continuous: . sodium chloride     [id number] **OR** acetaminophen, LORazepam **OR** LORazepam Anti-infectives (From admission, onward)   Start     Dose/Rate Route Frequency Ordered Stop   02/11/19 1000  cephALEXin (KEFLEX) capsule 500 mg     500 mg Oral Every 12 hours 02/11/19 0852 02/13/19 0959   02/10/19 1000  cephALEXin (KEFLEX) capsule 500 mg  Status:  Discontinued     500 mg Oral Every 12 hours 02/10/19 0816 02/11/19 0852   02/09/19 0022  cefTRIAXone (ROCEPHIN) 1 g in sodium chloride 0.9 % 100 mL IVPB  Status:  Discontinued     1 g 200 mL/hr over 30 Minutes Intravenous Every 24 hours 02/09/19 0023 02/10/19 0823   02/08/19 1715  vancomycin (VANCOCIN) IVPB 1000 mg/200 mL premix     1,000 mg 200 mL/hr over 60 Minutes Intravenous  Once 02/08/19 1713 02/08/19 2310   02/08/19 1700  vancomycin (VANCOCIN) injection 1 g  Status:  Discontinued     1 g Intravenous  Once 02/08/19 1659 02/08/19 1713     Scheduled Meds: . cephALEXin  500 mg Oral Q12H  . diltiazem  60 mg Oral Q12H  . metoprolol tartrate  50 mg Oral BID  . multivitamin with minerals  1 tablet Oral Daily  . pantoprazole (PROTONIX) IV  40 mg Intravenous Q12H  . polyethylene glycol-electrolytes  4,000 mL Oral Once  . potassium chloride  20 mEq Oral BID  . thiamine  100 mg Oral Daily   Continuous Infusions: . sodium chloride     PRN Meds:.acetaminophen **OR** acetaminophen, LORazepam **OR** LORazepam   Assessment: Active Problems:   Lower extremity cellulitis   Alcoholic cirrhosis of liver without ascites (HCC)   Blood in stool   Plan: Colonoscopy to evaluate rectal bleeding Also, recommend EGD along with colonoscopy given history of cirrhosis and patient being off blood thinner to  evaluate for varices and new onset of anemia Continue clear liquid diet Bowel prep tonight N.p.o. past midnight  Compensated cirrhosis of liver, History of alcohol use Check acute viral hepatitis panel Abstinence from alcohol EGD as above No evidence of ascites, hepatic encephalopathy, or liver lesions on right upper quadrant ultrasound Follow-up with GI for long-term cirrhosis care upon discharge   LOS: 3 days   Adriana Miller 02/11/2019, 3:06 PM

## 2019-02-11 NOTE — Progress Notes (Signed)
D: Pt alert and oriented. Pt denies experiencing any pain at this time. Pt is a one assist and uses the [facility] as well as front wheel walker. Pt likes to sit and dangle at bedside while eating meals. Pt is NPO at midnight d/t GI procedures scheduled for tomorrow. Pt has started GI prep this evening at 1700. Pt's VS have been stable today. Pt had one incontinence episode this morning during shift change.   A: Scheduled medications administered to pt, per MD orders. Support and encouragement provided. Frequent verbal contact made.   R: No adverse drug reactions noted. Pt complaint with medications and treatment plan. Pt interacts well with staff on the unit. Pt is stable at this time, will continue to monitor and provide care as ordered.

## 2019-02-11 NOTE — Progress Notes (Signed)
[facility] [place] NAME: Adriana Miller    MR#:  [id number]  DATE OF BIRTH:  1955/05/12  SUBJECTIVE:  HR better. BP soft but pt asymptomatic. So far tolerating po cardizem No loose stools this morning No abdominal pain No fever no chills  redness in the leg resolved  REVIEW OF SYSTEMS:    Review of Systems  Constitutional: Negative for chills, fever and weight loss.  HENT: Negative for ear discharge, ear pain and nosebleeds.   Eyes: Negative for blurred vision, pain and discharge.  Respiratory: Negative for sputum production, shortness of breath, wheezing and stridor.   Cardiovascular: Negative for chest pain, palpitations, orthopnea and PND.  Gastrointestinal: Negative for abdominal pain, diarrhea, nausea and vomiting.  Genitourinary: Negative for frequency and urgency.  Musculoskeletal: Positive for falls and joint pain. Negative for back pain.  Neurological: Positive for weakness. Negative for sensory change, speech change and focal weakness.  Psychiatric/Behavioral: Negative for depression and hallucinations. The patient is not nervous/anxious.     DRUG ALLERGIES:   Allergies  Allergen Reactions  . Aspirin Other (See Comments)    Nose bleed    VITALS:  Blood pressure 111/72, pulse 88, temperature 98.4 F (36.9 C), temperature source Oral, resp. rate 16, height 5\' 3"  (1.6 m), weight 71.7 kg, SpO2 99 %.  PHYSICAL EXAMINATION:   Physical Exam  GENERAL:  63 y.o.-year-old patient lying in the bed with no acute distress.  EYES: Pupils equal, round, reactive to light and accommodation. No scleral icterus. Extraocular muscles intact.  HEENT: Head atraumatic, normocephalic. Oropharynx and nasopharynx clear.  NECK:  Supple, no jugular venous distention. No thyroid enlargement, no tenderness.  LUNGS: Normal breath sounds bilaterally, no wheezing, rales, rhonchi. No use of accessory muscles of respiration.  CARDIOVASCULAR: S1, S2  normal. No murmurs, rubs, or gallops. irregularly irregular ABDOMEN: Soft, nontender, nondistended. Bowel sounds present. No organomegaly or mass.  EXTREMITIES: No cyanosis, clubbing , mild ankle edema b/l.  No Cellulitis  NEUROLOGIC: Cranial nerves II through XII are intact. No focal Motor or sensory deficits b/l.   PSYCHIATRIC: The patient is alert and oriented x 3.  SKIN: Decreased redness of the leg  LABORATORY PANEL:   CBC Recent Labs  Lab 02/10/19 0430  WBC 3.5*  HGB 9.5*  HCT 28.3*  PLT 40*   ------------------------------------------------------------------------------------------------------------------ Chemistries  Recent Labs  Lab 02/09/19 0634 02/10/19 0430 02/11/19 0616  NA 130* 134*  --   K 3.5 3.5 3.4*  CL 95* 100  --   CO2 26 24  --   GLUCOSE 124* 82  --   BUN 12 13  --   CREATININE 1.14* 1.02*  --   CALCIUM 7.0* 7.4*  --   MG 2.7* 2.3 2.0  AST 45*  --   --   ALT 18  --   --   ALKPHOS 34*  --   --   BILITOT 2.2*  --   --    ------------------------------------------------------------------------------------------------------------------  Cardiac Enzymes No results for input(s): TROPONINI in the last 168 hours. ------------------------------------------------------------------------------------------------------------------  RADIOLOGY:  No results found.   ASSESSMENT AND PLAN:   63 year old female patient with history of alcohol abuse in the past, chronic kidney disease stage III type 2 diabetes mellitus, hypertension, hyperlipidemia, seizure disorder currently under hospitalist service  *Left leg cellulitis resolved Continue IV Rocephin antibiotic--change to po keflex (2 more days) No evidence of osteomyelitis on x-ray  *Acute enteritis With hematochezia--resolved. Pt has not had  any BM since admisison Follow-up stool for C. difficile toxin, GI panel and norovirus--no BM to sned out -Hemoglobin appears to be stable so far, current hemoglobin  9.7 -Gastroenterology consult appreciated--colonoscopy for monday -Clear liquid diet--advance to regular  *Hypokalemia Replaced  *Pancytopenia/Thrombocytopenia Probably secondary cirrhosis of liver -Ultrasound of the abdomen reviewed shows cirrhosis of liver with h/o chronic ETOH abuse. Pt advised abstinence -Gallbladder distended with sludge but no evidence of cholecystitis -Eliquis on hold-- for now till followed as out pt by PCP or cardiology -No spontaneous bleeding noted  *History of alcohol abuse -On CIWA protocol--not in WD at present. -advised ETOH cessation -MVI  *Persisitent Chronic afib -resume Metoprolol, added cardizem 60 mg bid (new) -holding eliquis for now with mild bloody stools in the setting of Chronic TCP. -d/w GI before resuming eliquis depending what is found on colonoscopy eval  *DVT prophylaxis with sequential compression device to lower extremities  * PT to see pt--recommends HHPT  Anticipate discharged  tomorrow pending colonoscopy Patient agreeable with plan  CODE STATUS: Full code  DVT Prophylaxis: SCDs  TOTAL TIME TAKING CARE OF THIS PATIENT: 30 minutes.   POSSIBLE D/C IN 1 DAYS, DEPENDING ON CLINICAL CONDITION.  Fritzi Mandes M.D on 02/11/2019 at 8:54 AM  Between 7am to 6pm - Pager - [phone]  After 6pm go to [email or web address] - password [email or web address] [facility]  Office  [phone]  CC: Primary care physician; Ronnell Freshwater, NP  Note: This dictation was prepared with Dragon dictation along with smaller phrase technology. Any transcriptional errors that result from this process are unintentional.

## 2019-02-11 NOTE — Progress Notes (Signed)
Pharmacy Electrolyte Monitoring Consult:  Pharmacy consulted to assist in monitoring and replacing electrolytes in this 63 y.o. female admitted on 02/08/2019 with Diarrhea and fatigue. Pt w/ h/o CKD and EtOH abuse.  Labs:  Sodium (mmol/L)  Date Value  02/10/2019 134 (L)  01/27/2018 131 (L)  10/11/2013 138   Potassium (mmol/L)  Date Value  02/11/2019 3.4 (L)  10/11/2013 3.9   Magnesium (mg/dL)  Date Value  02/11/2019 2.0   Phosphorus (mg/dL)  Date Value  02/09/2019 2.9   Calcium (mg/dL)  Date Value  02/10/2019 7.4 (L)   Calcium, Total (mg/dL)  Date Value  10/11/2013 8.4 (L)   Albumin (g/dL)  Date Value  02/09/2019 2.1 (L)  01/27/2018 4.2   Corrected Calcium: 8.52  Assessment/Plan: K: 3.4 , Mg: 2.0. Ordered KCL 27meq x 1 dose.  Will F/U with AM labs and continue to replace electrolytes as needed.   Olivia Canter [facility] Clinical Pharmacist 02/11/2019 7:40 AM

## 2019-02-12 ENCOUNTER — Encounter
Admission: EM | Disposition: A | Discharge status: Home Health | Payer: Self-pay | Source: Home / Self Care | Attending: Internal Medicine

## 2019-02-12 ENCOUNTER — Encounter: Payer: Self-pay | Admitting: *Deleted

## 2019-02-12 ENCOUNTER — Inpatient Hospital Stay: Payer: Medicare Other | Admitting: Anesthesiology

## 2019-02-12 DIAGNOSIS — K635 Polyp of colon: Secondary | ICD-10-CM

## 2019-02-12 DIAGNOSIS — K254 Chronic or unspecified gastric ulcer with hemorrhage: Secondary | ICD-10-CM

## 2019-02-12 HISTORY — PX: COLONOSCOPY WITH PROPOFOL: SHX5780

## 2019-02-12 HISTORY — PX: ESOPHAGOGASTRODUODENOSCOPY (EGD) WITH PROPOFOL: SHX5813

## 2019-02-12 LAB — BASIC METABOLIC PANEL
Anion gap: 10 (ref 5–15)
BUN: 9 mg/dL (ref 8–23)
CO2: 24 mmol/L (ref 22–32)
Calcium: 8.3 mg/dL — ABNORMAL LOW (ref 8.9–10.3)
Chloride: 102 mmol/L (ref 98–111)
Creatinine, Ser: 0.91 mg/dL (ref 0.44–1.00)
GFR calc Af Amer: >60 mL/min (ref >60)
GFR calc non Af Amer: >60 mL/min (ref >60)
Glucose, Bld: 123 mg/dL — ABNORMAL HIGH (ref 70–99)
Potassium: 4.2 mmol/L (ref 3.5–5.1)
Sodium: 136 mmol/L (ref 135–145)

## 2019-02-12 LAB — HEPATITIS PANEL, ACUTE
HCV Ab: REACTIVE — AB
Hep A IgM: NONREACTIVE
Hep B C IgM: NONREACTIVE
Hepatitis B Surface Ag: NONREACTIVE

## 2019-02-12 LAB — CBC
HCT: 29.6 % — ABNORMAL LOW (ref 36.0–46.0)
Hemoglobin: 9.8 g/dL — ABNORMAL LOW (ref 12.0–15.0)
MCH: 33.3 pg (ref 26.0–34.0)
MCHC: 33.1 g/dL (ref 30.0–36.0)
MCV: 100.7 fL — ABNORMAL HIGH (ref 80.0–100.0)
Platelets: 75 10*3/uL — ABNORMAL LOW (ref 150–400)
RBC: 2.94 MIL/uL — ABNORMAL LOW (ref 3.87–5.11)
RDW: 14.3 % (ref 11.5–15.5)
WBC: 3.9 10*3/uL — ABNORMAL LOW (ref 4.0–10.5)
nRBC: 0 % (ref 0.0–0.2)

## 2019-02-12 LAB — MAGNESIUM: Magnesium: 1.8 mg/dL (ref 1.7–2.4)

## 2019-02-12 SURGERY — EGD (ESOPHAGOGASTRODUODENOSCOPY)
Anesthesia: General

## 2019-02-12 SURGERY — COLONOSCOPY WITH PROPOFOL
Anesthesia: General

## 2019-02-12 MED ORDER — GLYCOPYRROLATE 0.2 MG/ML IJ SOLN
INTRAMUSCULAR | Status: DC | PRN
  Administered 2019-02-12: 0.2 mg via INTRAVENOUS

## 2019-02-12 MED ORDER — PROPOFOL 500 MG/50ML IV EMUL
INTRAVENOUS | Status: AC
  Filled 2019-02-12: qty 100

## 2019-02-12 MED ORDER — MIDAZOLAM HCL 2 MG/2ML IJ SOLN
INTRAMUSCULAR | Status: DC | PRN
  Administered 2019-02-12: 2 mg via INTRAVENOUS

## 2019-02-12 MED ORDER — PROPOFOL 500 MG/50ML IV EMUL
INTRAVENOUS | Status: DC | PRN
  Administered 2019-02-12: 150 ug/kg/min via INTRAVENOUS

## 2019-02-12 MED ORDER — MIDAZOLAM HCL 2 MG/2ML IJ SOLN
INTRAMUSCULAR | Status: AC
  Filled 2019-02-12: qty 2

## 2019-02-12 MED ORDER — PROPOFOL 10 MG/ML IV BOLUS
INTRAVENOUS | Status: DC | PRN
  Administered 2019-02-12 (×3): 20 mg via INTRAVENOUS

## 2019-02-12 MED ORDER — POTASSIUM CHLORIDE 20 MEQ PO PACK: 20.0000 meq | PACK | Freq: Every day | ORAL | Status: DC

## 2019-02-12 MED ORDER — MAGNESIUM SULFATE IN D5W 1-5 GM/100ML-% IV SOLN
1.0000 g | Freq: Once | INTRAVENOUS | Status: AC
  Administered 2019-02-12: 09:00:00 1 g via INTRAVENOUS
  Filled 2019-02-12: qty 100

## 2019-02-12 NOTE — Anesthesia Post-op Follow-up Note (Signed)
Anesthesia QCDR form completed.        

## 2019-02-12 NOTE — OR Nursing (Signed)
[place] from transportation here to take pt back to her room via bed.

## 2019-02-12 NOTE — Care Management Important Message (Signed)
Important Message  Patient Details  Name: Adriana Miller MRN: [id number] Date of Birth: 06-04-1955   Medicare Important Message Given:  Yes     Juliann Pulse A Luba Matzen 02/12/2019, 12:25 PM

## 2019-02-12 NOTE — Op Note (Signed)
[facility] Gastroenterology Patient Name: Adriana Miller Procedure Date: 02/12/2019 12:26 PM MRN: [id number] Account #: [id number] Date of Birth: December 05, 1955 Admit Type: Outpatient Age: 63 Room: [facility] ENDO ROOM 2 Gender: Female Note Status: Finalized Procedure:            Upper GI endoscopy Indications:          Cirrhosis rule out esophageal varices Providers:            Lin Landsman MD, MD Medicines:            Monitored Anesthesia Care Complications:        No immediate complications. Estimated blood loss:                        Minimal. Procedure:            Pre-Anesthesia Assessment:                       - Prior to the procedure, a History and Physical was                        performed, and patient medications and allergies were                        reviewed. The patient is competent. The risks and                        benefits of the procedure and the sedation options and                        risks were discussed with the patient. All questions                        were answered and informed consent was obtained.                        Patient identification and proposed procedure were                        verified by the physician, the nurse, the                        anesthesiologist, the anesthetist and the technician in                        the pre-procedure area in the procedure room in the                        endoscopy suite. Mental Status Examination: alert and                        oriented. Airway Examination: normal oropharyngeal                        airway and neck mobility. Respiratory Examination:                        clear to auscultation. CV Examination: normal.                        Prophylactic Antibiotics:  The patient does not require                        prophylactic antibiotics. Prior Anticoagulants: The                        patient has taken Eliquis (apixaban), last dose was 4   days prior to procedure. ASA Grade Assessment: IV - A                        patient with severe systemic disease that is a constant                        threat to life. After reviewing the risks and benefits,                        the patient was deemed in satisfactory condition to                        undergo the procedure. The anesthesia plan was to use                        monitored anesthesia care (MAC). Immediately prior to                        administration of medications, the patient was                        re-assessed for adequacy to receive sedatives. The                        heart rate, respiratory rate, oxygen saturations, blood                        pressure, adequacy of pulmonary ventilation, and                        response to care were monitored throughout the                        procedure. The physical status of the patient was                        re-assessed after the procedure.                       After obtaining informed consent, the endoscope was                        passed under direct vision. Throughout the procedure,                        the patient's blood pressure, pulse, and oxygen                        saturations were monitored continuously. The Endoscope                        was introduced through the mouth, and advanced to the  second part of duodenum. The upper GI endoscopy was                        accomplished without difficulty. The patient tolerated                        the procedure well. Findings:      The duodenal bulb and second portion of the duodenum were normal.      Mild, diffuse portal hypertensive gastropathy was found in the gastric       fundus.      A single localized, diminutive bleeding erosion was found in the       prepyloric region of the stomach. Coagulation for hemostasis using       bipolar probe was successful. Estimated blood loss was minimal.      A small hiatal hernia  was present.      The gastroesophageal junction and examined esophagus were normal. Impression:           - Normal duodenal bulb and second portion of the                        duodenum.                       - Portal hypertensive gastropathy.                       - Bleeding erosive gastropathy. Treated with bipolar                        cautery.                       - Small hiatal hernia.                       - Normal gastroesophageal junction and esophagus.                       - No specimens collected. Recommendation:       - Proceed with colonoscopy as scheduled                       See colonoscopy report Procedure Code(s):    --- Professional ---                       [phone], Esophagogastroduodenoscopy, flexible, transoral;                        with control of bleeding, any method Diagnosis Code(s):    --- Professional ---                       K76.6, Portal hypertension                       K31.89, Other diseases of stomach and duodenum                       K92.2, Gastrointestinal hemorrhage, unspecified                       K44.9, Diaphragmatic hernia without obstruction or  gangrene                       K74.60, Unspecified cirrhosis of liver CPT copyright 2019 American Medical Association. All rights reserved. The codes documented in this report are preliminary and upon coder review may  be revised to meet current compliance requirements. Dr. Ulyess Mort Lin Landsman MD, MD 02/12/2019 3:14:43 PM This report has been signed electronically. Number of Addenda: 0 Note Initiated On: 02/12/2019 12:26 PM Estimated Blood Loss: Estimated blood loss was minimal.      [facility]

## 2019-02-12 NOTE — Anesthesia Postprocedure Evaluation (Signed)
Anesthesia Post Note  Patient: Adriana Miller  Procedure(s) Performed: COLONOSCOPY WITH PROPOFOL (N/A ) ESOPHAGOGASTRODUODENOSCOPY (EGD) WITH PROPOFOL (N/A )  Patient location during evaluation: Endoscopy Anesthesia Type: General Level of consciousness: awake and alert Pain management: pain level controlled Vital Signs Assessment: post-procedure vital signs reviewed and stable Respiratory status: spontaneous breathing, nonlabored ventilation, respiratory function stable and patient connected to nasal cannula oxygen Cardiovascular status: blood pressure returned to baseline and stable Postop Assessment: no apparent nausea or vomiting Anesthetic complications: no     Last Vitals:  Vitals:   02/12/19 1705 02/12/19 2206  BP: (!) 146/74 132/86  Pulse: 71 82  Resp: 19 18  Temp: 36.6 C 36.4 C  SpO2: 100% 100%    Last Pain:  Vitals:   02/12/19 2206  TempSrc: Oral  PainSc:                  Martha Clan

## 2019-02-12 NOTE — Progress Notes (Signed)
PT Cancellation Note  Pt not available for PT today as they were off the floor at a procedure (upper GI endoscopy). PT will follow up as able and appropriate.   Zachary George PT, [place] 4:54 PM,02/12/19 [phone]

## 2019-02-12 NOTE — Op Note (Signed)
[facility] Gastroenterology Patient Name: Adriana Miller Procedure Date: 02/12/2019 12:26 PM MRN: [id number] Account #: [id number] Date of Birth: 05-29-1955 Admit Type: Outpatient Age: 63 Room: [facility] ENDO ROOM 2 Gender: Female Note Status: Finalized Procedure:            Colonoscopy Indications:          Rectal bleeding Providers:            Lin Landsman MD, MD Medicines:            Monitored Anesthesia Care Complications:        No immediate complications. Estimated blood loss: None. Procedure:            Pre-Anesthesia Assessment:                       - Prior to the procedure, a History and Physical was                        performed, and patient medications and allergies were                        reviewed. The patient is competent. The risks and                        benefits of the procedure and the sedation options and                        risks were discussed with the patient. All questions                        were answered and informed consent was obtained.                        Patient identification and proposed procedure were                        verified by the physician, the nurse, the                        anesthesiologist, the anesthetist and the technician in                        the pre-procedure area in the procedure room in the                        endoscopy suite. Mental Status Examination: alert and                        oriented. Airway Examination: normal oropharyngeal                        airway and neck mobility. Respiratory Examination:                        clear to auscultation. CV Examination: normal.                        Prophylactic Antibiotics: The patient does not require  prophylactic antibiotics. Prior Anticoagulants: The                        patient has taken Eliquis (apixaban), last dose was 4                        days prior to procedure. ASA Grade Assessment: IV - A                     patient with severe systemic disease that is a constant                        threat to life. After reviewing the risks and benefits,                        the patient was deemed in satisfactory condition to                        undergo the procedure. The anesthesia plan was to use                        monitored anesthesia care (MAC). Immediately prior to                        administration of medications, the patient was                        re-assessed for adequacy to receive sedatives. The                        heart rate, respiratory rate, oxygen saturations, blood                        pressure, adequacy of pulmonary ventilation, and                        response to care were monitored throughout the                        procedure. The physical status of the patient was                        re-assessed after the procedure.                       After obtaining informed consent, the colonoscope was                        passed under direct vision. Throughout the procedure,                        the patient's blood pressure, pulse, and oxygen                        saturations were monitored continuously. The                        Colonoscope was introduced through the anus and  advanced to the the cecum, identified by appendiceal                        orifice and ileocecal valve. The colonoscopy was                        extremely difficult due to poor bowel prep and                        significant looping. Successful completion of the                        procedure was aided by changing the patient to a supine                        position, using manual pressure and applying abdominal                        pressure. The patient tolerated the procedure well. The                        quality of the bowel preparation was poor. Findings:      Hemorrhoids were found on perianal exam.      Three sessile polyps were  found in the sigmoid colon, descending colon       and ascending colon. The polyps were 3 to 5 mm in size.      Non-bleeding external and internal hemorrhoids were found during       retroflexion. The hemorrhoids were medium-sized.      Copious quantities of semi-liquid stool was found in the entire colon,       interfering with visualization. Lavage of the area was performed using a       large amount of sterile water, resulting in clearance with fair       visualization. Impression:           - Preparation of the colon was poor.                       - Hemorrhoids found on perianal exam.                       - Three 3 to 5 mm polyps in the sigmoid colon, in the                        descending colon and in the ascending colon.                       - Non-bleeding external and internal hemorrhoids.                       - Stool in the entire examined colon.                       - No specimens collected. Recommendation:       - Return patient to hospital ward for ongoing care.                       - Resume previous diet today.                       -  Await pathology results.                       - Repeat colonoscopy in 5 years for surveillance based                        on pathology results. Procedure Code(s):    --- Professional ---                       [id number], Colonoscopy, flexible; diagnostic, including                        collection of specimen(s) by brushing or washing, when                        performed (separate procedure) Diagnosis Code(s):    --- Professional ---                       K64.8, Other hemorrhoids                       K63.5, Polyp of colon                       K62.5, Hemorrhage of anus and rectum CPT copyright 2019 American Medical Association. All rights reserved. The codes documented in this report are preliminary and upon coder review may  be revised to meet current compliance requirements. Dr. Ulyess Mort Lin Landsman MD, MD 02/12/2019  4:05:10 PM This report has been signed electronically. Number of Addenda: 0 Note Initiated On: 02/12/2019 12:26 PM Scope Withdrawal Time: 0 hours 10 minutes 0 seconds  Total Procedure Duration: 0 hours 43 minutes 21 seconds  Estimated Blood Loss: Estimated blood loss: none.      [facility]

## 2019-02-12 NOTE — Progress Notes (Signed)
Pharmacy Electrolyte Monitoring Consult:  Pharmacy consulted to assist in monitoring and replacing electrolytes in this 63 y.o. female admitted on 02/08/2019 with Diarrhea and fatigue. Pt w/ h/o CKD and EtOH abuse.  Labs:  Sodium (mmol/L)  Date Value  02/12/2019 136  01/27/2018 131 (L)  10/11/2013 138   Potassium (mmol/L)  Date Value  02/12/2019 4.2  10/11/2013 3.9   Magnesium (mg/dL)  Date Value  02/12/2019 1.8   Phosphorus (mg/dL)  Date Value  02/09/2019 2.9   Calcium (mg/dL)  Date Value  02/12/2019 8.3 (L)   Calcium, Total (mg/dL)  Date Value  10/11/2013 8.4 (L)   Albumin (g/dL)  Date Value  02/09/2019 2.1 (L)  01/27/2018 4.2   Corrected Calcium: 8.52  Assessment/Plan: K: 4.2 , Mg: 1.8. Will order Mag 1g IV x 1 dose. KCL 71mEq BID ordered. Will DC order at this time.  Will F/U with AM labs and continue to replace electrolytes as needed.   Pernell Dupre, PharmD, BCPS Clinical Pharmacist 02/12/2019 7:23 AM

## 2019-02-12 NOTE — Anesthesia Preprocedure Evaluation (Addendum)
Anesthesia Evaluation  Patient identified by MRN, date of birth, ID band Patient awake    Reviewed: Allergy & Precautions, H&P , NPO status , reviewed documented beta blocker date and time   Airway Mallampati: II  TM Distance: <3 FB Neck ROM: full    Dental  (+) Poor Dentition, Chipped, Missing Very poor dentition, many missing. Denies any loose teeth. Advisory given. :   Pulmonary    Pulmonary exam normal        Cardiovascular hypertension, +CHF  Normal cardiovascular exam  PACs on EKG  01/15/19 ECHO IMPRESSIONS 1. Left ventricular ejection fraction, by visual estimation, is 55 to 60%. The left ventricle has normal function. Normal left ventricular size. There is no left ventricular hypertrophy.  2. Global right ventricle has normal systolic function.The right ventricular size is normal. No increase in right ventricular wall thickness.  3. Left atrial size was normal.  4. Right atrial size was normal.  5. The mitral valve is normal in structure. No evidence of mitral valve regurgitation. No evidence of mitral stenosis.  6. The tricuspid valve is normal in structure. Tricuspid valve regurgitation is trivial.  7. The aortic valve is normal in structure. Aortic valve regurgitation was not visualized by color flow Doppler. Structurally normal aortic valve, with no evidence of sclerosis or stenosis.  8. The pulmonic valve was normal in structure. Pulmonic valve regurgitation is trivial by color flow Doppler.  9. TR signal is inadequate for assessing pulmonary artery systolic pressure. 10. The inferior vena cava is normal in size with greater than 50% respiratory variability, suggesting right atrial pressure of 3 mmHg.   Neuro/Psych Seizures -,  PSYCHIATRIC DISORDERS H/O ETOH Abuse   GI/Hepatic neg GERD  ,(+) Cirrhosis       ,   Endo/Other  diabetes  Renal/GU Renal disease     Musculoskeletal   Abdominal   Peds  Hematology   Anesthesia Other Findings Past Medical History: No date: Cerebral aneurysm No date: Chronic kidney disease No date: Diabetes mellitus without complication (HCC) No date: Hyperlipidemia No date: Hypertension No date: Seizure disorder [facility])  Past Surgical History: 1991: CEREBRAL ANEURYSM REPAIR  BMI    Body Mass Index: 27.99 kg/m      Reproductive/Obstetrics                           Anesthesia Physical Anesthesia Plan  ASA: IV  Anesthesia Plan: General   Post-op Pain Management:    Induction: Intravenous  PONV Risk Score and Plan: Treatment may vary due to age or medical condition and TIVA  Airway Management Planned: Nasal Cannula and Natural Airway  Additional Equipment:   Intra-op Plan:   Post-operative Plan:   Informed Consent: I have reviewed the patients History and Physical, chart, labs and discussed the procedure including the risks, benefits and alternatives for the proposed anesthesia with the patient or authorized representative who has indicated his/her understanding and acceptance.     Dental Advisory Given  Plan Discussed with: CRNA  Anesthesia Plan Comments:         Anesthesia Quick Evaluation

## 2019-02-12 NOTE — Progress Notes (Signed)
[facility] [place] NAME: Adriana Miller    MR#:  [id number]  DATE OF BIRTH:  11-Jul-1955  SUBJECTIVE:   Patient states that she feels well today.  She was able to tolerate the full bowel prep last night.  Her stools are now clear.  She denies any melena or hematochezia.  REVIEW OF SYSTEMS:    Review of Systems  Constitutional: Negative for chills, fever and weight loss.  HENT: Negative for ear discharge, ear pain and nosebleeds.   Eyes: Negative for blurred vision, pain and discharge.  Respiratory: Negative for sputum production, shortness of breath, wheezing and stridor.   Cardiovascular: Negative for chest pain, palpitations, orthopnea and PND.  Gastrointestinal: Negative for abdominal pain, diarrhea, nausea and vomiting.  Genitourinary: Negative for frequency and urgency.  Musculoskeletal: Positive for falls and joint pain. Negative for back pain.  Neurological: Positive for weakness. Negative for sensory change, speech change and focal weakness.  Psychiatric/Behavioral: Negative for depression and hallucinations. The patient is not nervous/anxious.     DRUG ALLERGIES:   Allergies  Allergen Reactions  . Aspirin Other (See Comments)    Nose bleed    VITALS:  Blood pressure 134/76, pulse 66, temperature 98.7 F (37.1 C), temperature source Oral, resp. rate 16, height 5\' 3"  (1.6 m), weight 71.7 kg, SpO2 100 %.  PHYSICAL EXAMINATION:   Physical Exam  GENERAL:  63 y.o.-year-old patient lying in the bed with no acute distress.  EYES: Pupils equal, round, reactive to light and accommodation. No scleral icterus. Extraocular muscles intact.  HEENT: Head atraumatic, normocephalic. Oropharynx and nasopharynx clear.  NECK:  Supple, no jugular venous distention. No thyroid enlargement, no tenderness.  LUNGS: Normal breath sounds bilaterally, no wheezing, rales, rhonchi. No use of accessory muscles of respiration.  CARDIOVASCULAR: RRR, S1,  S2 normal. No murmurs, rubs, or gallops. irregularly irregular ABDOMEN: Soft, nontender, nondistended. Bowel sounds present. No organomegaly or mass.  EXTREMITIES: No cyanosis, clubbing , mild ankle edema b/l.  No erythema noted. NEUROLOGIC: Cranial nerves II through XII are intact. No focal Motor or sensory deficits b/l.   PSYCHIATRIC: The patient is alert and oriented x 3.  SKIN: Decreased redness of the leg  LABORATORY PANEL:   CBC Recent Labs  Lab 02/12/19 0429  WBC 3.9*  HGB 9.8*  HCT 29.6*  PLT 75*   ------------------------------------------------------------------------------------------------------------------ Chemistries  Recent Labs  Lab 02/09/19 0634  02/12/19 0429  NA 130*   < > 136  K 3.5   < > 4.2  CL 95*   < > 102  CO2 26   < > 24  GLUCOSE 124*   < > 123*  BUN 12   < > 9  CREATININE 1.14*   < > 0.91  CALCIUM 7.0*   < > 8.3*  MG 2.7*   < > 1.8  AST 45*  --   --   ALT 18  --   --   ALKPHOS 34*  --   --   BILITOT 2.2*  --   --    < > = values in this interval not displayed.   ------------------------------------------------------------------------------------------------------------------  Cardiac Enzymes No results for input(s): TROPONINI in the last 168 hours. ------------------------------------------------------------------------------------------------------------------  RADIOLOGY:  No results found.   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  Hemoglobin is stable. -Anemia panel was unremarkable -Monitor CBC as an  outpatient  Alcohol abuse -CIWA -MVI and thiamine  Persistent atrial fibrillation- appears to have converted to sinus rhythm yesterday afternoon after starting diltiazem. -Continue metoprolol and diltiazem -Holding Eliquis in the setting of GI bleeding  DVT prophylaxis-SCDs  CODE STATUS: Full code  TOTAL TIME TAKING CARE OF THIS PATIENT: 38 minutes.   POSSIBLE D/C IN 1-2 DAYS, DEPENDING ON CLINICAL CONDITION.  Berna Spare Mayo M.D on 02/12/2019 at 2:06 PM  Between 7am to 6pm - Pager [phone]  After 6pm go to [email or web address] - password [email or web address] [facility]  Office  [phone]  CC: Primary care physician; Ronnell Freshwater, NP  Note: This dictation was prepared with Dragon dictation along with smaller phrase technology. Any transcriptional errors that result from this process are unintentional.

## 2019-02-12 NOTE — Transfer of Care (Signed)
Immediate Anesthesia Transfer of Care Note  Patient: LAWANA STOESSEL  Procedure(s) Performed: COLONOSCOPY WITH PROPOFOL (N/A ) ESOPHAGOGASTRODUODENOSCOPY (EGD) WITH PROPOFOL (N/A )  Patient Location: PACU  Anesthesia Type:General  Level of Consciousness: awake and sedated  Airway & Oxygen Therapy: Patient Spontanous Breathing and Patient connected to nasal cannula oxygen  Post-op Assessment: Report given to RN and Post -op Vital signs reviewed and stable  Post vital signs: Reviewed and stable  Last Vitals:  Vitals Value Taken Time  BP 136/75 02/12/19 1607  Temp 36.2 C 02/12/19 1607  Pulse 86 02/12/19 1607  Resp 24 02/12/19 1607  SpO2 100 % 02/12/19 1607    Last Pain:  Vitals:   02/12/19 1607  TempSrc: Tympanic  PainSc: 0-No pain      Patients Stated Pain Goal: 2 ([id number])  Complications: No apparent anesthesia complications

## 2019-02-12 NOTE — TOC Progression Note (Signed)
Transition of Care [facility]) - Progression Note    Patient Details  Name: Adriana Miller MRN: [id number] Date of Birth: 25-Jul-1955  [facility]) CM/SW Contact  Amarisa Wilinski, Lenice Llamas Phone Number: [phone]  02/12/2019, 5:25 PM  Clinical Narrative: PT is recommending home health. Per [facility] representative they can accept patient for PT and aide. Clinical Social Worker (CSW) attempted to meet with patient however she was off the floor.      Expected Discharge Plan: Home/Self Care Barriers to Discharge: Continued Medical Work up  Expected Discharge Plan and Services Expected Discharge Plan: Home/Self Care In-house Referral: Clinical Social Work     Living arrangements for the past 2 months: Single Family Home                                       Social Determinants of Health (SDOH) Interventions    Readmission Risk Interventions No flowsheet data found.

## 2019-02-13 ENCOUNTER — Encounter: Payer: Self-pay | Admitting: Gastroenterology

## 2019-02-13 ENCOUNTER — Ambulatory Visit: Payer: Self-pay | Admitting: Nurse Practitioner

## 2019-02-13 LAB — CBC
HCT: 27.7 % — ABNORMAL LOW (ref 36.0–46.0)
Hemoglobin: 8.9 g/dL — ABNORMAL LOW (ref 12.0–15.0)
MCH: 33.1 pg (ref 26.0–34.0)
MCHC: 32.1 g/dL (ref 30.0–36.0)
MCV: 103 fL — ABNORMAL HIGH (ref 80.0–100.0)
Platelets: 79 10*3/uL — ABNORMAL LOW (ref 150–400)
RBC: 2.69 MIL/uL — ABNORMAL LOW (ref 3.87–5.11)
RDW: 14.5 % (ref 11.5–15.5)
WBC: 4 10*3/uL (ref 4.0–10.5)
nRBC: 0 % (ref 0.0–0.2)

## 2019-02-13 LAB — BASIC METABOLIC PANEL
Anion gap: 7 (ref 5–15)
BUN: 7 mg/dL — ABNORMAL LOW (ref 8–23)
CO2: 25 mmol/L (ref 22–32)
Calcium: 8.2 mg/dL — ABNORMAL LOW (ref 8.9–10.3)
Chloride: 104 mmol/L (ref 98–111)
Creatinine, Ser: 0.88 mg/dL (ref 0.44–1.00)
GFR calc Af Amer: >60 mL/min (ref >60)
GFR calc non Af Amer: >60 mL/min (ref >60)
Glucose, Bld: 87 mg/dL (ref 70–99)
Potassium: 3.8 mmol/L (ref 3.5–5.1)
Sodium: 136 mmol/L (ref 135–145)

## 2019-02-13 LAB — MAGNESIUM: Magnesium: 1.9 mg/dL (ref 1.7–2.4)

## 2019-02-13 MED ORDER — DILTIAZEM HCL ER COATED BEADS 120 MG PO CP24: 120.0000 mg | ORAL_CAPSULE | Freq: Every day | ORAL | 0 refills | Status: DC

## 2019-02-13 MED ORDER — PANTOPRAZOLE SODIUM 40 MG PO TBEC
40.0000 mg | DELAYED_RELEASE_TABLET | Freq: Two times a day (BID) | ORAL | Status: DC
  Administered 2019-02-13: 40 mg via ORAL
  Filled 2019-02-13: qty 1

## 2019-02-13 MED ORDER — POTASSIUM CHLORIDE 20 MEQ PO PACK
20.0000 meq | PACK | Freq: Once | ORAL | Status: AC
  Administered 2019-02-13: 20 meq via ORAL
  Filled 2019-02-13: qty 1

## 2019-02-13 MED ORDER — PANTOPRAZOLE SODIUM 40 MG PO TBEC: 40.0000 mg | DELAYED_RELEASE_TABLET | Freq: Two times a day (BID) | ORAL | 0 refills | Status: DC

## 2019-02-13 NOTE — Discharge Instructions (Signed)
It was so nice to meet you during this hospitalization!  You came into the hospital with redness of your leg and blood in your stool. We treated you with a course of antibiotics and your leg redness went away. You will not need any more antibiotics when you leave the hospital.   You also had a colonoscopy and endoscopy due to the bleeding in your stool. The colonoscopy showed 3 polyps which were removed. You will need a repeat colonoscopy in 5 years. The endoscopy should some bleeding in your stomach and the GI doctor was able to stop the bleeding.   I have made the following medication changes: 1. Please STOP taking the eliquis for now until you see the heart doctor and the GI doctor in clinic 2. We have started you on diltiazem, which is another medication to help control your heart rate. Please take Diltiazem 120mg  daily 3. I have started a medication called Protonix, which is an acid-reducing medicine that will help your stomach continue to heal. Please take Protonix 40mg  twice a day.  Take care, Dr. Brett Albino

## 2019-02-13 NOTE — Discharge Summary (Signed)
[facility] [place] NAME: Adriana Miller    MR#:  [id number]  DATE OF BIRTH:  05-10-1955  DATE OF ADMISSION:  02/08/2019   ADMITTING PHYSICIAN: Nicholes Mango, MD  DATE OF DISCHARGE: 02/13/19  PRIMARY CARE PHYSICIAN: Ronnell Freshwater, NP   ADMISSION DIAGNOSIS:  Bilirubinemia [E80.6] Thrombocytopenia (HCC) [D69.6] Cellulitis of left lower extremity [id number] Alcoholic cirrhosis of liver without ascites ([place]) [K70.30] Lower extremity cellulitis [L03.119] DISCHARGE DIAGNOSIS:  Active Problems:   Lower extremity cellulitis   Alcoholic cirrhosis of liver without ascites (HCC)   Blood in stool   Gastric erosion with bleeding  SECONDARY DIAGNOSIS:   Past Medical History:  Diagnosis Date  . Cerebral aneurysm   . Chronic kidney disease   . Diabetes mellitus without complication ([place])   . Hyperlipidemia   . Hypertension   . Seizure disorder Adriana Miller)    HOSPITAL COURSE:   Adriana Miller is a 63 year old female who presented to the ED with left leg redness and bloody diarrhea.  In the ED, right upper quadrant ultrasound showed liver cirrhosis.  Platelet count was low at 40,000.  She was admitted for further management.  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  Hemoglobin is stable. -Anemia panel was unremarkable -Needs CBC rechecked as an outpatient  Alcohol abuse -CIWA -MVI and thiamine  Persistent atrial fibrillation- appears to have converted to sinus rhythm after starting diltiazem. -Continued home metoprolol -Diltiazem was started this admission -Eliquis was held on discharge and may be restarted in the next 1-2 weeks -Patient should follow-up with cardiology in 1-2 weeks  DISCHARGE CONDITIONS:  Bleeding erosive gastropathy s/p cauterization Left leg cellulitis Liver cirrhosis Pancytopenia Alcohol abuse Persistent atrial fibrillation CONSULTS OBTAINED:  Treatment Team:  Lucilla Lame, MD DRUG ALLERGIES:   Allergies  Allergen Reactions  . Aspirin Other (See Comments)    Nose bleed   DISCHARGE MEDICATIONS:   Allergies as of 02/13/2019      Reactions   Aspirin Other (See Comments)   Nose bleed      Medication List    STOP taking these medications   apixaban 5 MG Tabs tablet Commonly known as: Eliquis     TAKE these medications   diltiazem 120 MG 24 hr capsule Commonly known as: Cardizem CD Take 1 capsule (120 mg total) by mouth daily.   enalapril 10 MG tablet Commonly known as: VASOTEC Take 0.5 tablets (5 mg total) by mouth daily.   furosemide 20 MG tablet Commonly known as: LASIX Take 1 tablet (20 mg total) by mouth daily.   metoprolol tartrate 25 MG tablet Commonly known as: LOPRESSOR Take 1 tablet (25 mg total) by mouth 2 (two) times daily.   pantoprazole 40 MG tablet Commonly known as: PROTONIX Take 1 tablet (40 mg total) by  mouth 2 (two) times daily.        DISCHARGE INSTRUCTIONS:  1.  Follow-up with PCP in 5 days 2.  Follow-up with GI in 1-2 weeks 3.  Follow up with cardiology in 1-2 weeks 4.  Start Protonix 40 mg twice daily 5.  Start diltiazem 100 mg daily 6.  Stop Eliquis for now DIET:  Cardiac diet DISCHARGE  CONDITION:  Stable ACTIVITY:  Activity as tolerated OXYGEN:  Home Oxygen: No.  Oxygen Delivery: room air DISCHARGE LOCATION:  home   If you experience worsening of your admission symptoms, develop shortness of breath, life threatening emergency, suicidal or homicidal thoughts you must seek medical attention immediately by calling 911 or calling your MD immediately  if symptoms less severe.  You Must read complete instructions/literature along with all the possible adverse reactions/side effects for all the Medicines you take and that have been prescribed to you. Take any new Medicines after you have completely understood and accpet all the possible adverse reactions/side effects.   Please note  You were cared for by a hospitalist during your hospital stay. If you have any questions about your discharge medications or the care you received while you were in the hospital after you are discharged, you can call the unit and asked to speak with the hospitalist on call if the hospitalist that took care of you is not available. Once you are discharged, your primary care physician will handle any further medical issues. Please note that NO REFILLS for any discharge medications will be authorized once you are discharged, as it is imperative that you return to your primary care physician (or establish a relationship with a primary care physician if you do not have one) for your aftercare needs so that they can reassess your need for medications and monitor your lab values.    On the day of Discharge:  VITAL SIGNS:  Blood pressure 112/63, pulse 66, temperature 98.3 F (36.8 C), temperature source Oral, resp. rate 17, height 5\' 3"  (1.6 m), weight 71.7 kg, SpO2 99 %. PHYSICAL EXAMINATION:  GENERAL:  63 y.o.-year-old patient lying in the bed with no acute distress.  EYES: Pupils equal, round, reactive to light and accommodation. No scleral icterus. Extraocular muscles intact.  HEENT: Head atraumatic,  normocephalic. Oropharynx and nasopharynx clear.  NECK:  Supple, no jugular venous distention. No thyroid enlargement, no tenderness.  LUNGS: Normal breath sounds bilaterally, no wheezing, rales, rhonchi. No use of accessory muscles of respiration.  CARDIOVASCULAR: RRR, S1, S2 normal. No murmurs, rubs, or gallops. irregularly irregular ABDOMEN: Soft, nontender, nondistended. Bowel sounds present. No organomegaly or mass.  EXTREMITIES: No cyanosis, clubbing , mild ankle edema b/l.  No erythema noted. NEUROLOGIC: Cranial nerves II through XII are intact. No focal Motor or sensory deficits b/l.   PSYCHIATRIC: The patient is alert and oriented x 3.  SKIN: Left leg redness has completely resolved. DATA REVIEW:   CBC Recent Labs  Lab 02/13/19 0406  WBC 4.0  HGB 8.9*  HCT 27.7*  PLT 79*    Chemistries  Recent Labs  Lab 02/09/19 0634  02/13/19 0406  NA 130*   < > 136  K 3.5   < > 3.8  CL 95*   < > 104  CO2 26   < > 25  GLUCOSE 124*   < > 87  BUN 12   < > 7*  CREATININE 1.14*   < > 0.88  CALCIUM 7.0*   < > 8.2*  MG 2.7*   < >  1.9  AST 45*  --   --   ALT 18  --   --   ALKPHOS 34*  --   --   BILITOT 2.2*  --   --    < > = values in this interval not displayed.     Microbiology Results  Results for orders placed or performed during the hospital encounter of 02/08/19  SARS CORONAVIRUS 2 (TAT 6-24 HRS) Nasopharyngeal Nasopharyngeal Swab     Status: None   Collection Time: 02/08/19  6:53 PM   Specimen: Nasopharyngeal Swab  Result Value Ref Range Status   SARS Coronavirus 2 NEGATIVE NEGATIVE Final    Comment: (NOTE) SARS-CoV-2 target nucleic acids are NOT DETECTED. The SARS-CoV-2 RNA is generally detectable in upper and lower respiratory specimens during the acute phase of infection. Negative results do not preclude SARS-CoV-2 infection, do not rule out co-infections with other pathogens, and should not be used as the sole basis for treatment or other patient management  decisions. Negative results must be combined with clinical observations, patient history, and epidemiological information. The expected result is Negative. Fact Sheet for Patients: SugarRoll.be Fact Sheet for Healthcare Providers: https://www.woods-mathews.com/ This test is not yet approved or cleared by the [place] FDA and  has been authorized for detection and/or diagnosis of SARS-CoV-2 by FDA under an Emergency Use Authorization (EUA). This EUA will remain  in effect (meaning this test can be used) for the duration of the COVID-19 declaration under Section 56 4(b)(1) of the Act, 21 U.S.C. section 360bbb-3(b)(1), unless the authorization is terminated or revoked sooner. Performed at [facility], [place] [address], [place], [place] [address]     RADIOLOGY:  No results found.   Management plans discussed with the patient, family and they are in agreement.  CODE STATUS: Full Code   TOTAL TIME TAKING CARE OF THIS PATIENT: 40 minutes.    Berna Spare Mayo M.D on 02/13/2019 at 10:37 AM  Between 7am to 6pm - Pager - [phone]  After 6pm go to [email or web address] - Proofreader  [facility]  Office  [phone]  CC: Primary care physician; Ronnell Freshwater, NP   Note: This dictation was prepared with Dragon dictation along with smaller phrase technology. Any transcriptional errors that result from this process are unintentional.

## 2019-02-13 NOTE — TOC Transition Note (Signed)
Transition of Care [facility]) - CM/SW Discharge Note   Patient Details  Name: Adriana Miller MRN: [id number] Date of Birth: 1955/10/11  Transition of Care [facility]) CM/SW Contact:  Miasha Emmons, Lenice Llamas Phone Number: [phone]  02/13/2019, 11:40 AM   Clinical Narrative: Clinical Social Worker (CSW) met with patient and explained that PT is recommending home health. Patient is agreeable to home health and does not have a home health agency preference. Per [facility] representative they can accept patient for PT and aide. Corene Cornea is aware that patient will D/C home today. Patient is agreeable to [facility]. PT is recommending a bedside commode. Per patient she has a walker at home and would like a bedside commode. [facility] [employer] agency representative is aware of above. Per patient she is ready to go home today and her brother will pick her up. RN aware of above. Please reconsult if future social work needs arise. CSW signing off.     Final next level of care: [place] Barriers to Discharge: Barriers Resolved   Patient Goals and CMS Choice Patient states their goals for this hospitalization and ongoing recovery are:: To go home. CMS Medicare.gov Compare Post Acute Care list provided to:: Patient Choice offered to / list presented to : Patient  Discharge Placement                       Discharge Plan and Services In-house Referral: Clinical Social Work              DME Arranged: Bedside commode DME Agency: [employer] Date DME Agency Contacted: 02/13/19 Time DME Agency Contacted: [id number] Representative spoke with at DME Agency: [place]: PT, Nurse's Aide [place] Agency: [place] ([place]) Date [place]: 02/13/19 Time [place]: 1139 Representative spoke with at [place]: [place] ([place]) Interventions     Readmission Risk Interventions No flowsheet data found.

## 2019-02-13 NOTE — Progress Notes (Signed)
Pharmacy Electrolyte Monitoring Consult:  Pharmacy consulted to assist in monitoring and replacing electrolytes in this 63 y.o. female admitted on 02/08/2019 with Diarrhea and fatigue. Pt w/ h/o CKD and EtOH abuse.  Labs:  Sodium (mmol/L)  Date Value  02/13/2019 136  01/27/2018 131 (L)  10/11/2013 138   Potassium (mmol/L)  Date Value  02/13/2019 3.8  10/11/2013 3.9   Magnesium (mg/dL)  Date Value  02/13/2019 1.9   Phosphorus (mg/dL)  Date Value  02/09/2019 2.9   Calcium (mg/dL)  Date Value  02/13/2019 8.2 (L)   Calcium, Total (mg/dL)  Date Value  10/11/2013 8.4 (L)   Albumin (g/dL)  Date Value  02/09/2019 2.1 (L)  01/27/2018 4.2   Corrected Calcium: 8.52  Assessment/Plan: K: 3.8 , Mg: 1.9. Potassium down from 4.2 yesterday.  Will order KCL 61mEq x 1.   Will F/U with AM labs and continue to replace electrolytes as needed.   Pernell Dupre, PharmD, BCPS Clinical Pharmacist 02/13/2019 7:14 AM

## 2019-02-13 NOTE — Progress Notes (Signed)
Adriana Miller  A and O x 4. VSS. Pt tolerating diet well. No complaints of pain or nausea. IV removed intact, prescriptions given. Pt voiced understanding of discharge instructions with no further questions. Pt discharged via wheelchair with NT.     Allergies as of 02/13/2019      Reactions   Aspirin Other (See Comments)   Nose bleed      Medication List    STOP taking these medications   apixaban 5 MG Tabs tablet Commonly known as: Eliquis     TAKE these medications   diltiazem 120 MG 24 hr capsule Commonly known as: Cardizem CD Take 1 capsule (120 mg total) by mouth daily.   enalapril 10 MG tablet Commonly known as: VASOTEC Take 0.5 tablets (5 mg total) by mouth daily.   furosemide 20 MG tablet Commonly known as: LASIX Take 1 tablet (20 mg total) by mouth daily.   metoprolol tartrate 25 MG tablet Commonly known as: LOPRESSOR Take 1 tablet (25 mg total) by mouth 2 (two) times daily.   pantoprazole 40 MG tablet Commonly known as: PROTONIX Take 1 tablet (40 mg total) by mouth 2 (two) times daily.            Durable Medical Equipment  (From admission, onward)         Start     Ordered   02/13/19 1120  For home use only DME 3 n 1  Once     02/13/19 1119          Vitals:   02/12/19 2328 02/13/19 0735  BP: 132/66 112/63  Pulse: 67 66  Resp: 18 17  Temp: 98.6 F (37 C) 98.3 F (36.8 C)  SpO2: 99% 99%    Francesco Sor

## 2019-02-14 ENCOUNTER — Ambulatory Visit: Payer: Medicare Other | Admitting: Internal Medicine

## 2019-02-14 LAB — SURGICAL PATHOLOGY

## 2019-02-14 NOTE — Progress Notes (Deleted)
Follow-up Outpatient Visit Date: 02/14/2019  Primary Care Provider: Ronnell Freshwater, NP [address] [place] [address]  Chief Complaint: ***  HPI:  Adriana Miller is a 63 y.o. year-old female with history of persistent atrial fibrillation, hypertension, hyperlipidemia, diabetes mellitus, chronic kidney disease, seizure disorder, and cerebral aneurysm status post repair, who presents for follow-up of atrial fibrillation.  I last saw her a month ago, at which time Adriana Miller on reported improved leg swelling and energy.  Her exertional dyspnea had also decreased following our addition of metoprolol and furosemide and discontinuation of amlodipine and diltiazem a week earlier.  It was also noted that she had spontaneously converted to sinus rhythm.  We did not make further medication changes at that time.  --------------------------------------------------------------------------------------------------  Cardiovascular History & Procedures: Cardiovascular Problems:  Atrial fibrillation  Risk Factors:  Hypertension, hyperlipidemia, diabetes mellitus, tobacco use, and age greater than 22  Cath/PCI:  None  CV Surgery:  None  EP Procedures and Devices:  None  Non-Invasive Evaluation(s):  TTE (01/15/2019): Normal LV size and wall thickness.  LVEF 55-60%.  Normal RV size and function.  No significant valvular abnormality.  Recent CV Pertinent Labs: Lab Results  Component Value Date   CHOL 134 01/27/2018   HDL 80 01/27/2018   LDLCALC 42 01/27/2018   TRIG 60 01/27/2018   INR 2.6 (H) 02/08/2019   K 3.8 02/13/2019   K 3.9 10/11/2013   MG 1.9 02/13/2019   BUN 7 (L) 02/13/2019   BUN 13 01/27/2018   BUN 13 10/11/2013   CREATININE 0.88 02/13/2019   CREATININE 0.87 10/11/2013    Past medical and surgical history were reviewed and updated in EPIC.  No outpatient medications have been marked as taking for the 02/14/19 encounter (Appointment) with Aysen Shieh, Adriana Gave,  MD.    Allergies: Aspirin  Social History   Tobacco Use  . Smoking status: Never Smoker  . Smokeless tobacco: Current User    Types: Snuff  Substance Use Topics  . Alcohol use: Yes    Alcohol/week: 14.0 standard drinks    Types: 14 Cans of beer per week    Comment: pt has cut back on alcohol use  . Drug use: No    Family History  Problem Relation Age of Onset  . Heart attack Brother 71    Review of Systems: A 12-system review of systems was performed and was negative except as noted in the HPI.  --------------------------------------------------------------------------------------------------  Physical Exam: There were no vitals taken for this visit.  General:  *** HEENT: No conjunctival pallor or scleral icterus. Moist mucous membranes.  OP clear. Neck: Supple without lymphadenopathy, thyromegaly, JVD, or HJR. No carotid bruit. Lungs: Normal work of breathing. Clear to auscultation bilaterally without wheezes or crackles. Heart: Regular rate and rhythm without murmurs, rubs, or gallops. Non-displaced PMI. Abd: Bowel sounds present. Soft, NT/ND without hepatosplenomegaly Ext: No lower extremity edema. Radial, PT, and DP pulses are 2+ bilaterally. Skin: Warm and dry without rash.  EKG:  ***  Lab Results  Component Value Date   WBC 4.0 02/13/2019   HGB 8.9 (L) 02/13/2019   HCT 27.7 (L) 02/13/2019   MCV 103.0 (H) 02/13/2019   PLT 79 (L) 02/13/2019    Lab Results  Component Value Date   NA 136 02/13/2019   K 3.8 02/13/2019   CL 104 02/13/2019   CO2 25 02/13/2019   BUN 7 (L) 02/13/2019   CREATININE 0.88 02/13/2019   GLUCOSE 87 02/13/2019  ALT 18 02/09/2019    Lab Results  Component Value Date   CHOL 134 01/27/2018   HDL 80 01/27/2018   LDLCALC 42 01/27/2018   TRIG 60 01/27/2018    --------------------------------------------------------------------------------------------------  ASSESSMENT AND PLAN: Adriana Gave [name], MD 02/14/2019 7:18 AM

## 2019-02-15 ENCOUNTER — Encounter: Payer: Self-pay | Admitting: Gastroenterology

## 2019-02-20 ENCOUNTER — Telehealth: Payer: Self-pay

## 2019-02-20 ENCOUNTER — Ambulatory Visit (INDEPENDENT_AMBULATORY_CARE_PROVIDER_SITE_OTHER): Payer: Medicare Other | Admitting: Internal Medicine

## 2019-02-20 ENCOUNTER — Other Ambulatory Visit: Payer: Self-pay

## 2019-02-20 ENCOUNTER — Encounter: Payer: Self-pay | Admitting: Internal Medicine

## 2019-02-20 ENCOUNTER — Other Ambulatory Visit: Payer: Self-pay | Admitting: Internal Medicine

## 2019-02-20 DIAGNOSIS — K7031 Alcoholic cirrhosis of liver with ascites: Secondary | ICD-10-CM | POA: Diagnosis not present

## 2019-02-20 DIAGNOSIS — I10 Essential (primary) hypertension: Secondary | ICD-10-CM

## 2019-02-20 DIAGNOSIS — I4891 Unspecified atrial fibrillation: Secondary | ICD-10-CM

## 2019-02-20 DIAGNOSIS — Z1231 Encounter for screening mammogram for malignant neoplasm of breast: Secondary | ICD-10-CM

## 2019-02-20 DIAGNOSIS — N6320 Unspecified lump in the left breast, unspecified quadrant: Secondary | ICD-10-CM

## 2019-02-20 NOTE — Progress Notes (Signed)
[facility] [place], [place] [address]  Internal MEDICINE  Office Visit Note  Patient Name: Adriana Miller  [id number]  [id number]  Date of Service: 02/20/2019 Transitional care management   Chief Complaint  Patient presents with  . Hospitalization Follow-up    cellulitis of left lower extremity   . Hypertension  . Quality Metric Gaps    mammo,pap and bone density     HPI Pt is here for hospital follow up, pt had recent admission for left leg cellulitis and blood diarrhea.  Hospital is as follows per discharge summary  GI bleed-bleeding has resolved -s/p endoscopy 10/26 with portal hypertensive gastropathy and bleeding erosive gastropathy, for which cautery was performed -s/p colonoscopy 10/26 with 3 polyps removed -Eliquis was held on discharge and may be restarted in the next couple of weeks -Treated with IV Protonix and then transitioned to p.o. 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 Hemoglobin is stable. -Anemia panel was unremarkable -Needs CBC rechecked as an outpatient  Alcohol abuse -CIWA -MVI and thiamine  Persistent atrial fibrillation-appears to have converted to sinus rhythmafter starting diltiazem. -Continued home metoprolol -Diltiazem was started this admission -Eliquis was held on discharge and may be restarted in the next 1-2 weeks  Today pt has bradycardia, she feels weak and dizzy. C/O left breast induration. She has been started on Cardizem and was kept on other 2 bp meds. She is not taking Eliquis any  more. She is orthostatic here in the office     Current Medication: Outpatient Encounter Medications as of 02/20/2019  Medication Sig  . diltiazem (CARDIZEM CD) 120 MG 24 hr capsule Take 1 capsule (120 mg total) by mouth daily.  . enalapril (VASOTEC) 10 MG tablet Take 0.5 tablets (5 mg total) by mouth daily.  . furosemide (LASIX) 20 MG tablet Take 1 tablet (20 mg total) by mouth daily.  . metoprolol tartrate (LOPRESSOR) 25 MG tablet Take 1 tablet (25 mg total) by mouth 2 (two) times daily.  . pantoprazole (PROTONIX) 40 MG tablet Take 1 tablet (40 mg total) by mouth 2 (two) times daily.   No facility-administered encounter medications on file as of 02/20/2019.    Surgical History: Past Surgical History:  Procedure Laterality Date  . CEREBRAL ANEURYSM REPAIR  1991  . COLONOSCOPY WITH PROPOFOL N/A 02/12/2019   Procedure: COLONOSCOPY WITH PROPOFOL;  Surgeon: Lin Landsman, MD;  Location[facility] ENDOSCOPY;  Service: Gastroenterology;  Laterality: N/A;  . ESOPHAGOGASTRODUODENOSCOPY (EGD) WITH PROPOFOL N/A 02/12/2019   Procedure: ESOPHAGOGASTRODUODENOSCOPY (EGD) WITH PROPOFOL;  Surgeon: Lin Landsman, MD;  Location: [facility] ENDOSCOPY;  Service: Gastroenterology;  Laterality: N/A;   Medical History: Past Medical History:  Diagnosis Date  . Cerebral aneurysm   . Chronic kidney disease   . Diabetes mellitus without complication ([place])   . Hyperlipidemia   . Hypertension   . Seizure disorder [facility])    Family History: Family History  Problem Relation Age of Onset  . Heart attack Brother 63   Review of Systems  Constitutional: Negative for chills, diaphoresis and fatigue.  HENT: Negative for ear pain, postnasal drip and sinus  pressure.   Eyes: Negative for photophobia, discharge, redness, itching and visual disturbance.  Respiratory: Negative for cough, shortness of breath and wheezing.   Cardiovascular: Negative for chest pain, palpitations and leg swelling.  Gastrointestinal:  Negative for abdominal pain, constipation, diarrhea, nausea and vomiting.  Genitourinary: Negative for dysuria and flank pain.  Musculoskeletal: Negative for arthralgias, back pain, gait problem and neck pain.  Skin: Negative for color change.  Allergic/Immunologic: Negative for environmental allergies and food allergies.  Neurological: Positive for weakness. Negative for dizziness and headaches.  Hematological: Does not bruise/bleed easily.  Psychiatric/Behavioral: Negative for agitation, behavioral problems (depression) and hallucinations.   Vital Signs: Temp 97.6 F (36.4 C)   Resp 16   Ht 5\' 6"  (1.676 m)   Wt 147 lb (66.7 kg)   SpO2 96%   BMI 23.73 kg/m    Physical Exam Constitutional:      General: She is not in acute distress.    Appearance: She is well-developed. She is not diaphoretic.  HENT:     Head: Normocephalic and atraumatic.     Mouth/Throat:     Pharynx: No oropharyngeal exudate.  Eyes:     Pupils: Pupils are equal, round, and reactive to light.  Neck:     Musculoskeletal: Normal range of motion and neck supple.     Thyroid: No thyromegaly.     Vascular: No JVD.     Trachea: No tracheal deviation.  Cardiovascular:     Rate and Rhythm: Normal rate and regular rhythm.     Heart sounds: Normal heart sounds. No murmur. No friction rub. No gallop.   Pulmonary:     Effort: Pulmonary effort is normal. No respiratory distress.     Breath sounds: No wheezing or rales.  Chest:     Chest wall: No tenderness.  Abdominal:     General: Bowel sounds are normal.     Palpations: Abdomen is soft.  Musculoskeletal: Normal range of motion.  Lymphadenopathy:     Cervical: No cervical adenopathy.  Skin:    General: Skin is warm and dry.  Neurological:     Mental Status: She is alert and oriented to person, place, and time.     Cranial Nerves: No cranial nerve deficit.  Psychiatric:        Behavior: Behavior normal.        Thought Content: Thought content normal.         Judgment: Judgment normal.    LABS: Recent Results (from the past 2160 hour(s))  TSH     Status: None   Collection Time: 01/08/19  4:46 PM  Result Value Ref Range   TSH 2.104 0.350 - 4.500 uIU/mL    Comment: Performed by a 3rd Generation assay with a functional sensitivity of <=0.01 uIU/mL. Performed at [facility], [address] [place], [place] [id number]   Basic metabolic panel     Status: Abnormal   Collection Time: 01/08/19  4:46 PM  Result Value Ref Range   Sodium 135 135 - 145 mmol/L   Potassium 4.6 3.5 - 5.1 mmol/L   Chloride 101 98 - 111 mmol/L   CO2 22 22 - 32 mmol/L   Glucose, Bld 104 (H) 70 - 99 mg/dL   BUN 10 8 - 23 mg/dL   Creatinine, Ser 1.06 (H) 0.44 - 1.00 mg/dL   Calcium 8.9 8.9 - 10.3 mg/dL   GFR calc non Af Amer 55 (L) >60 mL/min   GFR calc Af Amer >60 >60  mL/min   Anion gap 12 5 - 15    Comment: Performed at [facility], [address] [place], [place] [address]  Magnesium     Status: None   Collection Time: 01/15/19  4:00 PM  Result Value Ref Range   Magnesium 1.7 1.7 - 2.4 mg/dL    Comment: Performed at [facility], [address] [place], [place] [id number]  Basic metabolic panel     Status: Abnormal   Collection Time: 01/15/19  4:00 PM  Result Value Ref Range   Sodium 139 135 - 145 mmol/L   Potassium 4.0 3.5 - 5.1 mmol/L   Chloride 98 98 - 111 mmol/L   CO2 27 22 - 32 mmol/L   Glucose, Bld 118 (H) 70 - 99 mg/dL   BUN 11 8 - 23 mg/dL   Creatinine, Ser 0.86 0.44 - 1.00 mg/dL   Calcium 8.9 8.9 - 10.3 mg/dL   GFR calc non Af Amer >60 >60 mL/min   GFR calc Af Amer >60 >60 mL/min   Anion gap 14 5 - 15    Comment: Performed at [facility], [address] [place], [place] [address]  CBC with Differential     Status: Abnormal   Collection Time: 02/08/19  3:51 PM  Result Value Ref Range   WBC 5.6 4.0 - 10.5 K/uL   RBC 3.55 (L) 3.87 - 5.11 MIL/uL   Hemoglobin 11.7 (L) 12.0 - 15.0 g/dL   HCT 34.8 (L)  36.0 - 46.0 %   MCV 98.0 80.0 - 100.0 fL   MCH 33.0 26.0 - 34.0 pg   MCHC 33.6 30.0 - 36.0 g/dL   RDW 13.9 11.5 - 15.5 %   Platelets 43 (L) 150 - 400 K/uL    Comment: PLATELET COUNT CONFIRMED BY SMEAR Immature Platelet Fraction may be clinically indicated, consider ordering this additional test [id number]    nRBC 0.0 0.0 - 0.2 %   Neutrophils Relative % 82 %   Neutro Abs 4.6 1.7 - 7.7 K/uL   Lymphocytes Relative 8 %   Lymphs Abs 0.5 (L) 0.7 - 4.0 K/uL   Monocytes Relative 9 %   Monocytes Absolute 0.5 0.1 - 1.0 K/uL   Eosinophils Relative 0 %   Eosinophils Absolute 0.0 0.0 - 0.5 K/uL   Basophils Relative 0 %   Basophils Absolute 0.0 0.0 - 0.1 K/uL   Immature Granulocytes 1 %   Abs Immature Granulocytes 0.04 0.00 - 0.07 K/uL    Comment: Performed at [facility], [address] [place], [place] [address]  Comprehensive metabolic panel     Status: Abnormal   Collection Time: 02/08/19  3:51 PM  Result Value Ref Range   Sodium 133 (L) 135 - 145 mmol/L   Potassium 2.9 (L) 3.5 - 5.1 mmol/L   Chloride 91 (L) 98 - 111 mmol/L   CO2 22 22 - 32 mmol/L   Glucose, Bld 108 (H) 70 - 99 mg/dL   BUN 8 8 - 23 mg/dL   Creatinine, Ser 1.10 (H) 0.44 - 1.00 mg/dL   Calcium 7.6 (L) 8.9 - 10.3 mg/dL   Total Protein 8.6 (H) 6.5 - 8.1 g/dL   Albumin 2.9 (L) 3.5 - 5.0 g/dL   AST 72 (H) 15 - 41 U/L   ALT 25 0 - 44 U/L   Alkaline Phosphatase 50 38 - 126 U/L   Total Bilirubin 3.5 (H) 0.3 - 1.2 mg/dL   GFR calc non Af Wyvonnia Lora  53 (L) >60 mL/min   GFR calc Af Amer >60 >60 mL/min   Anion gap 20 (H) 5 - 15    Comment: Performed at [facility], [address] [place], [place] [address]  Lipase, blood     Status: None   Collection Time: 02/08/19  3:51 PM  Result Value Ref Range   Lipase 36 11 - 51 U/L    Comment: Performed at [facility], [address], [place], [place] [address]  Magnesium     Status: Abnormal   Collection Time: 02/08/19  3:51 PM  Result Value Ref Range    Magnesium 1.1 (L) 1.7 - 2.4 mg/dL    Comment: Performed at [facility], [address] [place], [place] [address]  Protime-INR     Status: Abnormal   Collection Time: 02/08/19  3:51 PM  Result Value Ref Range   Prothrombin Time 27.2 (H) 11.4 - 15.2 seconds   INR 2.6 (H) 0.8 - 1.2    Comment: (NOTE) INR goal varies based on device and disease states. Performed at [facility], [address] [place], [place] [address]   Ethanol     Status: None   Collection Time: 02/08/19  3:51 PM  Result Value Ref Range   Alcohol, Ethyl (B) <10 <10 mg/dL    Comment: (NOTE) Lowest detectable limit for serum alcohol is 10 mg/dL. For medical purposes only. Performed at [facility], [place], [place] [address]   SARS CORONAVIRUS 2 (TAT 6-24 HRS) Nasopharyngeal Nasopharyngeal Swab     Status: None   Collection Time: 02/08/19  6:53 PM   Specimen: Nasopharyngeal Swab  Result Value Ref Range   SARS Coronavirus 2 NEGATIVE NEGATIVE    Comment: (NOTE) SARS-CoV-2 target nucleic acids are NOT DETECTED. The SARS-CoV-2 RNA is generally detectable in upper and lower respiratory specimens during the acute phase of infection. Negative results do not preclude SARS-CoV-2 infection, do not rule out co-infections with other pathogens, and should not be used as the sole basis for treatment or other patient management decisions. Negative results must be combined with clinical observations, patient history, and epidemiological information. The expected result is Negative. Fact Sheet for Patients: SugarRoll.be Fact Sheet for Healthcare Providers: https://www.woods-mathews.com/ This test is not yet approved or cleared by the [place] FDA and  has been authorized for detection and/or diagnosis of SARS-CoV-2 by FDA under an Emergency Use Authorization (EUA). This EUA will remain  in effect (meaning this test can be used)  for the duration of the COVID-19 declaration under Section 56 4(b)(1) of the Act, 21 U.S.C. section 360bbb-3(b)(1), unless the authorization is terminated or revoked sooner. Performed at [facility], [place] [address] [place], [place] [address]   CBC     Status: Abnormal   Collection Time: 02/08/19  9:59 PM  Result Value Ref Range   WBC 5.4 4.0 - 10.5 K/uL   RBC 3.12 (L) 3.87 - 5.11 MIL/uL   Hemoglobin 10.3 (L) 12.0 - 15.0 g/dL   HCT 30.4 (L) 36.0 - 46.0 %   MCV 97.4 80.0 - 100.0 fL   MCH 33.0 26.0 - 34.0 pg   MCHC 33.9 30.0 - 36.0 g/dL   RDW 14.0 11.5 - 15.5 %   Platelets 36 (L) 150 - 400 K/uL    Comment: Immature Platelet Fraction may be clinically indicated, consider ordering this additional test [id number]    nRBC 0.0 0.0 - 0.2 %  Comment: Performed at [facility], [address] [place], [place] [address]  Comprehensive metabolic panel     Status: Abnormal   Collection Time: 02/09/19 12:20 AM  Result Value Ref Range   Sodium 129 (L) 135 - 145 mmol/L   Potassium 3.1 (L) 3.5 - 5.1 mmol/L   Chloride 91 (L) 98 - 111 mmol/L   CO2 26 22 - 32 mmol/L   Glucose, Bld 183 (H) 70 - 99 mg/dL   BUN 11 8 - 23 mg/dL   Creatinine, Ser 1.17 (H) 0.44 - 1.00 mg/dL   Calcium 7.3 (L) 8.9 - 10.3 mg/dL   Total Protein 7.7 6.5 - 8.1 g/dL   Albumin 2.5 (L) 3.5 - 5.0 g/dL   AST 59 (H) 15 - 41 U/L   ALT 21 0 - 44 U/L   Alkaline Phosphatase 42 38 - 126 U/L   Total Bilirubin 3.0 (H) 0.3 - 1.2 mg/dL   GFR calc non Af Amer 49 (L) >60 mL/min   GFR calc Af Amer 57 (L) >60 mL/min   Anion gap 12 5 - 15    Comment: Performed at [facility], [address], [place], [place] [address]  Magnesium     Status: Abnormal   Collection Time: 02/09/19 12:20 AM  Result Value Ref Range   Magnesium 1.4 (L) 1.7 - 2.4 mg/dL    Comment: Performed at [facility], [address] [place], [place] [address]  Phosphorus     Status: None   Collection Time: 02/09/19 12:20 AM   Result Value Ref Range   Phosphorus 2.9 2.5 - 4.6 mg/dL    Comment: Performed at [facility], [address] [place], [place] [address]  HIV Antibody (routine testing w rflx)     Status: None   Collection Time: 02/09/19 12:57 AM  Result Value Ref Range   HIV Screen 4th Generation wRfx NON REACTIVE NON REACTIVE    Comment: Performed at [facility], [address] [place], [place] [address]  Hemoglobin and hematocrit, blood     Status: Abnormal   Collection Time: 02/09/19 12:57 AM  Result Value Ref Range   Hemoglobin 10.8 (L) 12.0 - 15.0 g/dL   HCT 32.1 (L) 36.0 - 46.0 %    Comment: Performed at [facility], [address] [place], [place] [address]  Comprehensive metabolic panel     Status: Abnormal   Collection Time: 02/09/19  6:34 AM  Result Value Ref Range   Sodium 130 (L) 135 - 145 mmol/L   Potassium 3.5 3.5 - 5.1 mmol/L   Chloride 95 (L) 98 - 111 mmol/L   CO2 26 22 - 32 mmol/L   Glucose, Bld 124 (H) 70 - 99 mg/dL   BUN 12 8 - 23 mg/dL   Creatinine, Ser 1.14 (H) 0.44 - 1.00 mg/dL   Calcium 7.0 (L) 8.9 - 10.3 mg/dL   Total Protein 6.5 6.5 - 8.1 g/dL   Albumin 2.1 (L) 3.5 - 5.0 g/dL   AST 45 (H) 15 - 41 U/L   ALT 18 0 - 44 U/L   Alkaline Phosphatase 34 (L) 38 - 126 U/L   Total Bilirubin 2.2 (H) 0.3 - 1.2 mg/dL   GFR calc non Af Amer 50 (L) >60 mL/min   GFR calc Af Amer 58 (L) >60 mL/min   Anion gap 9 5 - 15    Comment: Performed at [facility], [address], [place], [place]  [address]  CBC     Status: Abnormal   Collection Time: 02/09/19  6:34 AM  Result Value Ref Range   WBC 4.6 4.0 - 10.5 K/uL   RBC 2.91 (L) 3.87 - 5.11 MIL/uL   Hemoglobin 9.7 (L) 12.0 - 15.0 g/dL   HCT 28.6 (L) 36.0 - 46.0 %   MCV 98.3 80.0 - 100.0 fL   MCH 33.3 26.0 - 34.0 pg   MCHC 33.9 30.0 - 36.0 g/dL   RDW 13.9 11.5 - 15.5 %   Platelets 29 (LL) 150 - 400 K/uL    Comment: REPEATED TO VERIFY PLATELET COUNT CONFIRMED BY SMEAR Immature Platelet  Fraction may be clinically indicated, consider ordering this additional test [id number] THIS CRITICAL RESULT HAS VERIFIED AND BEEN CALLED TO LILA MILAS RN BY RASHA HASSAN ON 10 23 2020 AT 0750, AND HAS BEEN READ BACK.     nRBC 0.0 0.0 - 0.2 %    Comment: Performed at [facility], [address] [place], [place] [address]  Magnesium     Status: Abnormal   Collection Time: 02/09/19  6:34 AM  Result Value Ref Range   Magnesium 2.7 (H) 1.7 - 2.4 mg/dL    Comment: Performed at [facility], [address] [place], [place] [address]  Hemoglobin and hematocrit, blood     Status: Abnormal   Collection Time: 02/09/19 12:14 PM  Result Value Ref Range   Hemoglobin 9.4 (L) 12.0 - 15.0 g/dL   HCT 27.0 (L) 36.0 - 46.0 %    Comment: Performed at [facility], [address] [place], [place] [id number]  Basic metabolic panel     Status: Abnormal   Collection Time: 02/10/19  4:30 AM  Result Value Ref Range   Sodium 134 (L) 135 - 145 mmol/L   Potassium 3.5 3.5 - 5.1 mmol/L   Chloride 100 98 - 111 mmol/L   CO2 24 22 - 32 mmol/L   Glucose, Bld 82 70 - 99 mg/dL   BUN 13 8 - 23 mg/dL   Creatinine, Ser 1.02 (H) 0.44 - 1.00 mg/dL   Calcium 7.4 (L) 8.9 - 10.3 mg/dL   GFR calc non Af Amer 58 (L) >60 mL/min   GFR calc Af Amer >60 >60 mL/min   Anion gap 10 5 - 15    Comment: Performed at [facility], [address] [place], [place] [address]  Magnesium     Status: None   Collection Time: 02/10/19  4:30 AM  Result Value Ref Range   Magnesium 2.3 1.7 - 2.4 mg/dL    Comment: Performed at [facility], [address] [place], [place] [address]  CBC     Status: Abnormal   Collection Time: 02/10/19  4:30 AM  Result Value Ref Range   WBC 3.5 (L) 4.0 - 10.5 K/uL   RBC 2.90 (L) 3.87 - 5.11 MIL/uL   Hemoglobin 9.5 (L) 12.0 - 15.0 g/dL   HCT 28.3 (L) 36.0 - 46.0 %   MCV 97.6 80.0 - 100.0 fL   MCH 32.8 26.0 - 34.0 pg   MCHC 33.6 30.0 - 36.0 g/dL   RDW  14.1 11.5 - 15.5 %   Platelets 40 (L) 150 - 400 K/uL    Comment: Immature Platelet Fraction may be clinically indicated, consider ordering this additional test [id number]    nRBC 0.0 0.0 - 0.2 %    Comment: Performed at [facility], [address] [place], [place] [address]  Vitamin B12     Status: Abnormal   Collection Time: 02/10/19  4:30 AM  Result Value Ref Range   Vitamin B-12 968 (H) 180 - 914 pg/mL    Comment: (NOTE) This assay is not validated for testing neonatal or myeloproliferative syndrome specimens for Vitamin B12 levels. Performed at [facility], [address] [place], [place] [address]   Potassium     Status: Abnormal   Collection Time: 02/11/19  6:16 AM  Result Value Ref Range   Potassium 3.4 (L) 3.5 - 5.1 mmol/L    Comment: Performed at [facility], [address] [place], [place] [address]  Magnesium     Status: None   Collection Time: 02/11/19  6:16 AM  Result Value Ref Range   Magnesium 2.0 1.7 - 2.4 mg/dL    Comment: Performed at [facility], [address] [place], [place] [address]  Ferritin     Status: Abnormal   Collection Time: 02/11/19  6:16 AM  Result Value Ref Range   Ferritin 427 (H) 11 - 307 ng/mL    Comment: Performed at [facility], [address] [place], [place] [address]  Iron and TIBC     Status: Abnormal   Collection Time: 02/11/19  6:16 AM  Result Value Ref Range   Iron 76 28 - 170 ug/dL   TIBC 122 (L) 250 - 450 ug/dL   Saturation Ratios 62 (H) 10.4 - 31.8 %   UIBC 46 ug/dL    Comment: Performed at [facility], [address] [place], [place] [address]  Folate     Status: None   Collection Time: 02/11/19  6:16 AM  Result Value Ref Range   Folate 9.7 >5.9 ng/mL    Comment: Performed at [facility], [address] [place], [place] [address]  Hepatitis panel, acute     Status: Abnormal   Collection Time: 02/11/19  4:16 PM  Result Value Ref Range    Hepatitis B Surface Ag NON REACTIVE NON REACTIVE   HCV Ab Reactive (A) NON REACTIVE    Comment: (NOTE) The CDC recommends that a Reactive HCV antibody result be followed up  with a HCV Nucleic Acid Amplification test.    Hep A IgM NON REACTIVE NON REACTIVE   Hep B C IgM NON REACTIVE NON REACTIVE    Comment: Performed at [facility], [place] [address] [place], [place] [id number]  Basic metabolic panel     Status: Abnormal   Collection Time: 02/12/19  4:29 AM  Result Value Ref Range   Sodium 136 135 - 145 mmol/L   Potassium 4.2 3.5 - 5.1 mmol/L   Chloride 102 98 - 111 mmol/L   CO2 24 22 - 32 mmol/L   Glucose, Bld 123 (H) 70 - 99 mg/dL   BUN 9 8 - 23 mg/dL   Creatinine, Ser 0.91 0.44 - 1.00 mg/dL   Calcium 8.3 (L) 8.9 - 10.3 mg/dL   GFR calc non Af Amer >60 >60 mL/min   GFR calc Af Amer >60 >60 mL/min   Anion gap 10 5 - 15    Comment: Performed at [facility], [address], [place], [place] [address]  Magnesium     Status: None   Collection Time: 02/12/19  4:29 AM  Result Value Ref Range   Magnesium 1.8 1.7 - 2.4 mg/dL    Comment: Performed at [facility], [address], [place], [place] [address]  CBC     Status: Abnormal   Collection Time: 02/12/19  4:29 AM  Result Value Ref Range   WBC 3.9 (L) 4.0 - 10.5 K/uL   RBC 2.94 (L) 3.87 - 5.11 MIL/uL   Hemoglobin 9.8 (L) 12.0 - 15.0 g/dL   HCT 29.6 (L) 36.0 - 46.0 %   MCV 100.7 (H) 80.0 - 100.0 fL   MCH 33.3 26.0 - 34.0 pg   MCHC 33.1 30.0 - 36.0 g/dL   RDW 14.3 11.5 - 15.5 %   Platelets 75 (L) 150 - 400 K/uL    Comment: Immature Platelet Fraction may be clinically indicated, consider ordering this additional test [id number]    nRBC 0.0 0.0 - 0.2 %    Comment: Performed at [facility], [address], [place], [place] [address]  Surgical pathology     Status: None   Collection Time: 02/12/19  3:38 PM  Result Value Ref Range   SURGICAL PATHOLOGY      SURGICAL PATHOLOGY CASE:  [phone] PATIENT: Riki Altes Surgical Pathology Report     Specimen Submitted: A. Colon polyp, ascending; cold snare B. Colon polyp x2, descending; cold snare  Clinical History: Blood in stool.  Portal gastropathy, prepyloric bleeding erosion, hiatal hernia, colon polyps, external hemorrhoids    DIAGNOSIS: A. COLON POLYP, ASCENDING; COLD SNARE: - TUBULAR ADENOMA. - NEGATIVE FOR HIGH-GRADE DYSPLASIA AND MALIGNANCY.  B. COLON POLYPS X2, DESCENDING; COLD SNARE: - FRAGMENTS (X3) OF TUBULAR ADENOMAS. - NEGATIVE FOR HIGH-GRADE DYSPLASIA AND MALIGNANCY.  GROSS DESCRIPTION: A. Labeled: Ascending colon polyp, cold snare Received: In formalin Tissue fragment(s): 1 Size: 0.3 cm Description: Tan soft tissue fragment Entirely submitted in 1 cassette.  B. Labeled: Descending colon polyp x2, cold snare Received: In formalin Tissue fragment(s): 3 Size: From 0.1-1.0 cm Description: Tan soft tissue fragments Entirely submitted in 1  cassette.   Final Diagnosis performed by Allena Napoleon, MD.   Electronically signed 02/14/2019 8:30:25AM The electronic signature indicates that the named Attending Pathologist has evaluated the specimen Technical component performed at [facility], [address], [place], [place] [address] Lab: [phone] Dir: Rush Farmer, MD, MMM  Professional component performed at [facility], [facility], [place], [place], [place] [address] Lab: [phone] Dir: Dellia Nims. Rubinas, MD   Magnesium     Status: None   Collection Time: 02/13/19  4:06 AM  Result Value Ref Range   Magnesium 1.9 1.7 - 2.4 mg/dL    Comment: Performed at [facility], [address] [place], [place] [id number]  Basic metabolic panel     Status: Abnormal   Collection Time: 02/13/19  4:06 AM  Result Value Ref Range   Sodium 136 135 - 145 mmol/L   Potassium 3.8 3.5 - 5.1 mmol/L   Chloride 104 98 - 111 mmol/L   CO2 25 22 - 32 mmol/L   Glucose, Bld  87 70 - 99 mg/dL   BUN 7 (L) 8 - 23 mg/dL   Creatinine, Ser 0.88 0.44 - 1.00 mg/dL   Calcium 8.2 (L) 8.9 - 10.3 mg/dL   GFR calc non Af Amer >60 >60 mL/min   GFR calc Af Amer >60 >60 mL/min   Anion gap 7 5 - 15    Comment: Performed at [facility], [address] [place], [place] [address]  CBC     Status: Abnormal   Collection Time: 02/13/19  4:06 AM  Result Value Ref Range   WBC 4.0 4.0 - 10.5 K/uL  RBC 2.69 (L) 3.87 - 5.11 MIL/uL   Hemoglobin 8.9 (L) 12.0 - 15.0 g/dL   HCT 27.7 (L) 36.0 - 46.0 %   MCV 103.0 (H) 80.0 - 100.0 fL   MCH 33.1 26.0 - 34.0 pg   MCHC 32.1 30.0 - 36.0 g/dL   RDW 14.5 11.5 - 15.5 %   Platelets 79 (L) 150 - 400 K/uL    Comment: Immature Platelet Fraction may be clinically indicated, consider ordering this additional test [id number]    nRBC 0.0 0.0 - 0.2 %    Comment: Performed at [facility], [address], [place], [place] [address]   Assessment/Plan: 1. Atrial fibrillation, unspecified type ([place]) - Pt has bradycardia, might have SSS, will need cardiology eval, DC Lopressor for now, continue cardizem  2. Essential hypertension - Pt will need to be further monitor and adjust meds like vasotec and Lasix - EKG 12-Lead  3. Visit for screening mammogram - Abnormal left breast appearance, right breast mammogram abnormal in 2018, pt never went for follow up filsm - MM [place]; Future  4. Alcoholic cirrhosis of liver with ascites (HCC) - Pt has stopped drinking for now, however has gastropathy on EGD, will monitor,c ontinue on PPI // Might need spironolactone.  General Counseling: zyian klim understanding of the findings of todays visit and agrees with plan of treatment. I have discussed any further diagnostic evaluation that may be needed or ordered today. We also reviewed her medications today. she has been encouraged to call the office with any questions or concerns that should arise related to todays  visit.  Orders Placed This Encounter  Procedures  . MM DIGITAL SCREENING BILATERAL  . EKG 12-Lead   Time spent:[address] [place], MD  Internal Medicine

## 2019-02-20 NOTE — Telephone Encounter (Signed)
Tried calling pt to schedule one week follow up per dr Humphrey Rolls due to pt medications and discuss plan of care. Once pt calls back she needs to be placed on schd. Beth

## 2019-02-21 ENCOUNTER — Other Ambulatory Visit: Payer: Self-pay | Admitting: Internal Medicine

## 2019-02-21 DIAGNOSIS — N6310 Unspecified lump in the right breast, unspecified quadrant: Secondary | ICD-10-CM

## 2019-02-22 ENCOUNTER — Other Ambulatory Visit: Payer: Self-pay | Admitting: *Deleted

## 2019-02-22 ENCOUNTER — Inpatient Hospital Stay
Admission: RE | Admit: 2019-02-22 | Discharge: 2019-02-22 | Disposition: A | Discharge status: Home / Self Care | Payer: Self-pay | Source: Ambulatory Visit | Attending: *Deleted | Admitting: *Deleted

## 2019-02-22 DIAGNOSIS — Z1231 Encounter for screening mammogram for malignant neoplasm of breast: Secondary | ICD-10-CM

## 2019-02-23 ENCOUNTER — Encounter: Payer: Self-pay | Admitting: Internal Medicine

## 2019-02-23 ENCOUNTER — Ambulatory Visit (INDEPENDENT_AMBULATORY_CARE_PROVIDER_SITE_OTHER): Payer: Medicare Other | Admitting: Internal Medicine

## 2019-02-23 ENCOUNTER — Other Ambulatory Visit: Payer: Self-pay

## 2019-02-23 VITALS — BP 87/56 | HR 77 | Ht 66.0 in | Wt 150.8 lb

## 2019-02-23 DIAGNOSIS — I5033 Acute on chronic diastolic (congestive) heart failure: Secondary | ICD-10-CM

## 2019-02-23 DIAGNOSIS — I959 Hypotension, unspecified: Secondary | ICD-10-CM | POA: Diagnosis not present

## 2019-02-23 DIAGNOSIS — I4819 Other persistent atrial fibrillation: Secondary | ICD-10-CM

## 2019-02-23 DIAGNOSIS — K7030 Alcoholic cirrhosis of liver without ascites: Secondary | ICD-10-CM | POA: Diagnosis not present

## 2019-02-23 NOTE — Progress Notes (Signed)
Follow-up Outpatient Visit Date: 02/23/2019  Primary Care Provider: Ronnell Freshwater, NP [address] [place] [address]  Chief Complaint: Follow-up atrial fibrillation  HPI:  Adriana Miller is a 63 y.o. year-old female with history of persistent atrial fibrillation, hypertension, hyperlipidemia, diabetes mellitus, chronic kidney disease, seizure disorder, and cerebral aneurysm status post repair, who presents for follow-up of atrial fibrillation.  I last saw her in late September, at which time Adriana Miller on reported improved leg swelling and energy.  Her exertional dyspnea had also decreased following our addition of metoprolol and furosemide and discontinuation of amlodipine and diltiazem a week earlier.  It was also noted that she had spontaneously converted to sinus rhythm.  We did not make further medication changes at that time.  Adriana Miller was hospitalized last month for cellulitis and GI bleed.  She was diagnosed with cirrhosis and erosive gastropathy.  Apixaban was discontinued.  Today, Adriana Miller reports that she feels very well.  She has not had chest pain, shortness of breath, palpitations, lightheadedness, or edema.  She denies falling.  She has stopped consuming alcohol.  She has not had any diarrhea or further rectal bleeding.  --------------------------------------------------------------------------------------------------  Cardiovascular History & Procedures: Cardiovascular Problems:  Atrial fibrillation  Risk Factors:  Hypertension, hyperlipidemia, diabetes mellitus, tobacco use, and age greater than 44  Cath/PCI:  None  CV Surgery:  None  EP Procedures and Devices:  None  Non-Invasive Evaluation(s):  TTE (01/15/2019): Normal LV size and wall thickness.  LVEF 55-60%.  Normal RV size and function.  No significant valvular abnormality.  Recent CV Pertinent Labs: Lab Results  Component Value Date   CHOL 134 01/27/2018   HDL 80 01/27/2018   LDLCALC 42 01/27/2018   TRIG 60 01/27/2018   INR 2.6 (H) 02/08/2019   K 3.8 02/13/2019   K 3.9 10/11/2013   MG 1.9 02/13/2019   BUN 7 (L) 02/13/2019   BUN 13 01/27/2018   BUN 13 10/11/2013   CREATININE 0.88 02/13/2019   CREATININE 0.87 10/11/2013    Past medical and surgical history were reviewed and updated in EPIC.  Current Meds  Medication Sig  . diltiazem (CARDIZEM CD) 120 MG 24 hr capsule Take 1 capsule (120 mg total) by mouth daily.  . enalapril (VASOTEC) 10 MG tablet Take 0.5 tablets (5 mg total) by mouth daily.  . furosemide (LASIX) 20 MG tablet Take 1 tablet (20 mg total) by mouth daily.  . pantoprazole (PROTONIX) 40 MG tablet Take 1 tablet (40 mg total) by mouth 2 (two) times daily.    Allergies: Aspirin  Social History   Tobacco Use  . Smoking status: Never Smoker  . Smokeless tobacco: Current User    Types: Snuff  Substance Use Topics  . Alcohol use: Yes    Alcohol/week: 14.0 standard drinks    Types: 14 Cans of beer per week    Comment: pt has cut back on alcohol use  . Drug use: No    Family History  Problem Relation Age of Onset  . Heart attack Brother 73    Review of Systems: A 12-system review of systems was performed and was negative except as noted in the HPI.  --------------------------------------------------------------------------------------------------  Physical Exam: BP (!) 87/56 (BP Location: Right Arm, Patient Position: Sitting, Cuff Size: Normal)   Pulse 77   Ht 5\' 6"  (1.676 m)   Wt 150 lb 12 oz (68.4 kg)   SpO2 98%   BMI 24.33 kg/m   General: NAD.  HEENT: No conjunctival pallor or scleral icterus.  Facemask in place. Neck: Supple without lymphadenopathy, thyromegaly, JVD, or HJR. Lungs: Normal work of breathing. Clear to auscultation bilaterally without wheezes or crackles. Heart: Irregularly irregular without murmurs, rubs, or gallops. Non-displaced PMI. Abd: Bowel sounds present. Soft, NT/ND without hepatosplenomegaly Ext:  No lower extremity edema. Skin: Warm and dry without rash.  EKG: Atrial fibrillation (heart rate 77 bpm) with low voltage and prolonged QT (QTC 504 ms).  Lab Results  Component Value Date   WBC 4.0 02/13/2019   HGB 8.9 (L) 02/13/2019   HCT 27.7 (L) 02/13/2019   MCV 103.0 (H) 02/13/2019   PLT 79 (L) 02/13/2019    Lab Results  Component Value Date   NA 136 02/13/2019   K 3.8 02/13/2019   CL 104 02/13/2019   CO2 25 02/13/2019   BUN 7 (L) 02/13/2019   CREATININE 0.88 02/13/2019   GLUCOSE 87 02/13/2019   ALT 18 02/09/2019    Lab Results  Component Value Date   CHOL 134 01/27/2018   HDL 80 01/27/2018   LDLCALC 42 01/27/2018   TRIG 60 01/27/2018    --------------------------------------------------------------------------------------------------  ASSESSMENT AND PLAN: Persistent atrial fibrillation: The patient appears to be back in atrial fibrillation today though she is asymptomatic with adequate heart rate control.  She was previously on metoprolol and diltiazem, though the former was discontinued recently due to soft blood pressure.  We will continue with diltiazem 120 mg daily; long term it may be beneficial to transition to a non-selective beta-blocker like propranolol or nadolol for beneficial effects related to portal hypertension.  I think it is reasonable to forego anticoagulation at this time in the setting of recent GI bleed due to erosive gastropathy in the setting of portal hypertension and cirrhosis.  We will need to continue with a rate control strategy; Adriana Miller is not a candidate for cardioversion or antiarrhythmic therapy if she cannot safely take anticoagulation.  Chronic HFpEF: Adriana Miller appears euvolemic today.  I think it is reasonable to continue furosemide 20 mg daily.  Hypotension: Though asymptomatic, the patient's blood pressure is low today.  We will discontinue enalapril.  Diltiazem should be continued, if possible, to maintain reasonable  ventricular rate control in the setting of atrial fibrillation.  Cirrhosis: Adriana Miller reports having quit drinking.  She should continue close follow-up with her PCP and gastroenterology.  Follow-up: Return to clinic in 5 to 6 weeks.  Nelva Bush, MD 02/23/2019 3:23 PM

## 2019-02-23 NOTE — Patient Instructions (Signed)
Medication Instructions:  Your physician has recommended you make the following change in your medication:  1- STOP Enalapril.  *If you need a refill on your cardiac medications before your next appointment, please call your pharmacy*  Lab Work: none If you have labs (blood work) drawn today and your tests are completely normal, you will receive your results only by: Marland Kitchen MyChart Message (if you have MyChart) OR . A paper copy in the mail If you have any lab test that is abnormal or we need to change your treatment, we will call you to review the results.  Testing/Procedures: none  Follow-Up: At [facility], you and your health needs are our priority.  As part of our continuing mission to provide you with exceptional heart care, we have created designated Provider Care Teams.  These Care Teams include your primary Cardiologist (physician) and Advanced Practice Providers (APPs -  Physician Assistants and Nurse Practitioners) who all work together to provide you with the care you need, when you need it.  Your next appointment:   in mid-December sometime after been seen by GI doctor.  The format for your next appointment:   In Person  Provider:    You may see DR Harrell Gave END or one of the following Advanced Practice Providers on your designated Care Team:    Murray Hodgkins, NP  Christell Faith, PA-C  Marrianne Mood, PA-C

## 2019-02-25 ENCOUNTER — Encounter: Payer: Self-pay | Admitting: Internal Medicine

## 2019-02-27 ENCOUNTER — Other Ambulatory Visit: Payer: Medicare Other

## 2019-02-28 ENCOUNTER — Telehealth: Payer: Self-pay

## 2019-02-28 NOTE — Telephone Encounter (Signed)
PAPERWORK REQUESTING INDEPENDENT ASSESSMENT FOR PERSONAL CARE SERVICES COMPLETED AND FAXED TO [facility]. COMPLETED PAPERWORK GIVEN TO [place].

## 2019-03-06 ENCOUNTER — Ambulatory Visit: Payer: Medicare Other | Admitting: Internal Medicine

## 2019-03-07 ENCOUNTER — Other Ambulatory Visit: Payer: Self-pay | Admitting: Internal Medicine

## 2019-03-07 ENCOUNTER — Ambulatory Visit
Admission: RE | Admit: 2019-03-07 | Discharge: 2019-03-07 | Disposition: A | Discharge status: Home / Self Care | Payer: Medicare Other | Source: Ambulatory Visit | Attending: Internal Medicine | Admitting: Internal Medicine

## 2019-03-07 DIAGNOSIS — N6310 Unspecified lump in the right breast, unspecified quadrant: Secondary | ICD-10-CM | POA: Diagnosis not present

## 2019-03-07 DIAGNOSIS — N6489 Other specified disorders of breast: Secondary | ICD-10-CM | POA: Insufficient documentation

## 2019-03-07 DIAGNOSIS — R922 Inconclusive mammogram: Secondary | ICD-10-CM | POA: Diagnosis not present

## 2019-03-07 DIAGNOSIS — N6320 Unspecified lump in the left breast, unspecified quadrant: Secondary | ICD-10-CM

## 2019-03-07 IMAGING — US US BREAST*L* LIMITED INC AXILLA
1 series · 6 of 6 positions shown · non-contrast
Comparison: Previous exam(s).

CLINICAL DATA: Patient presents with new palpable areas of concern
in the left breast.

EXAM:
DIGITAL DIAGNOSTIC BILATERAL MAMMOGRAM WITH CAD AND TOMO
ULTRASOUND LEFT BREAST

[Series 1: us breast*left* limited inc axilla · 0.06mm/px · 6 of 6 slices shown]
[im 1/6]
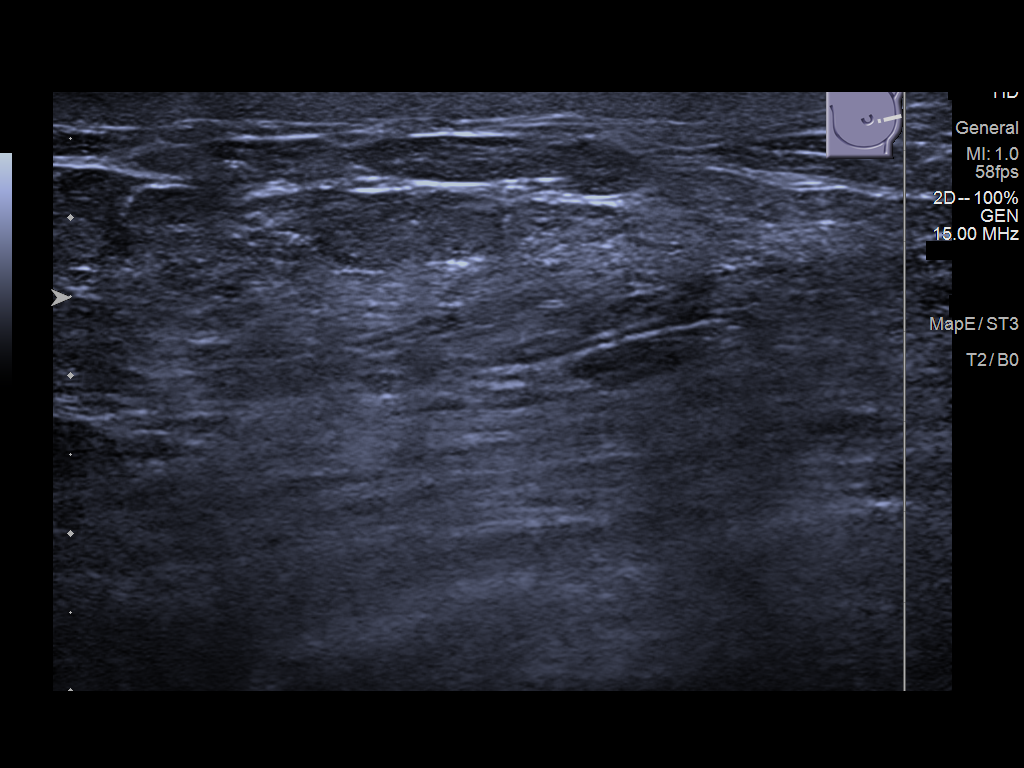
[im 2/6]
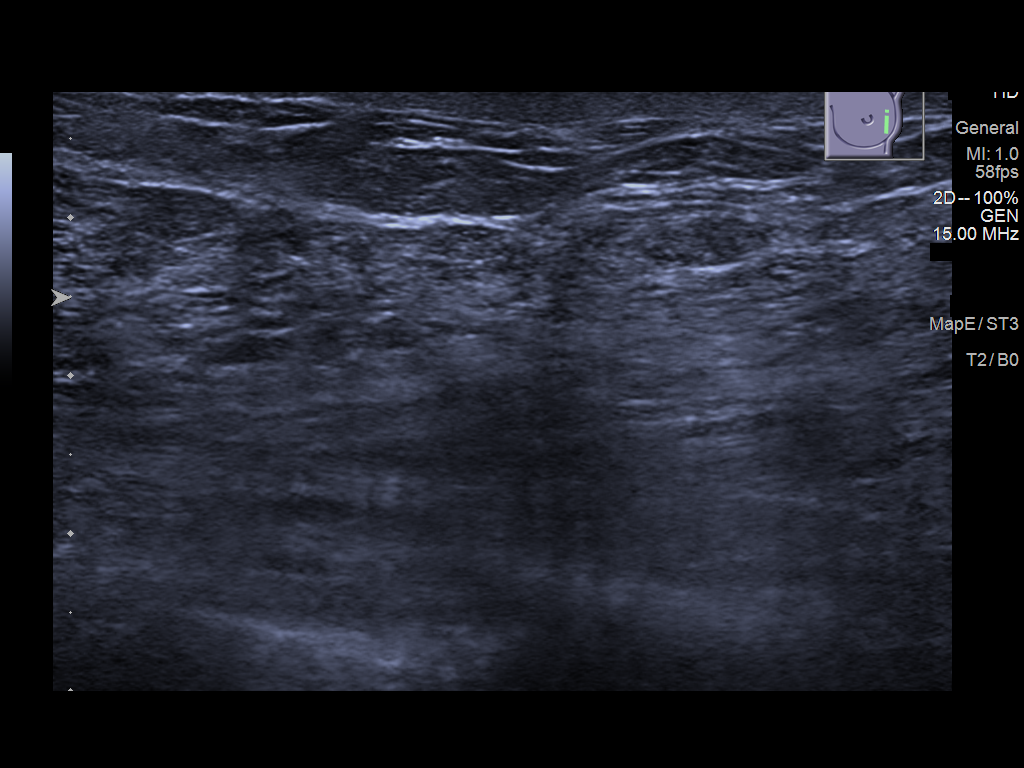
[im 3/6]
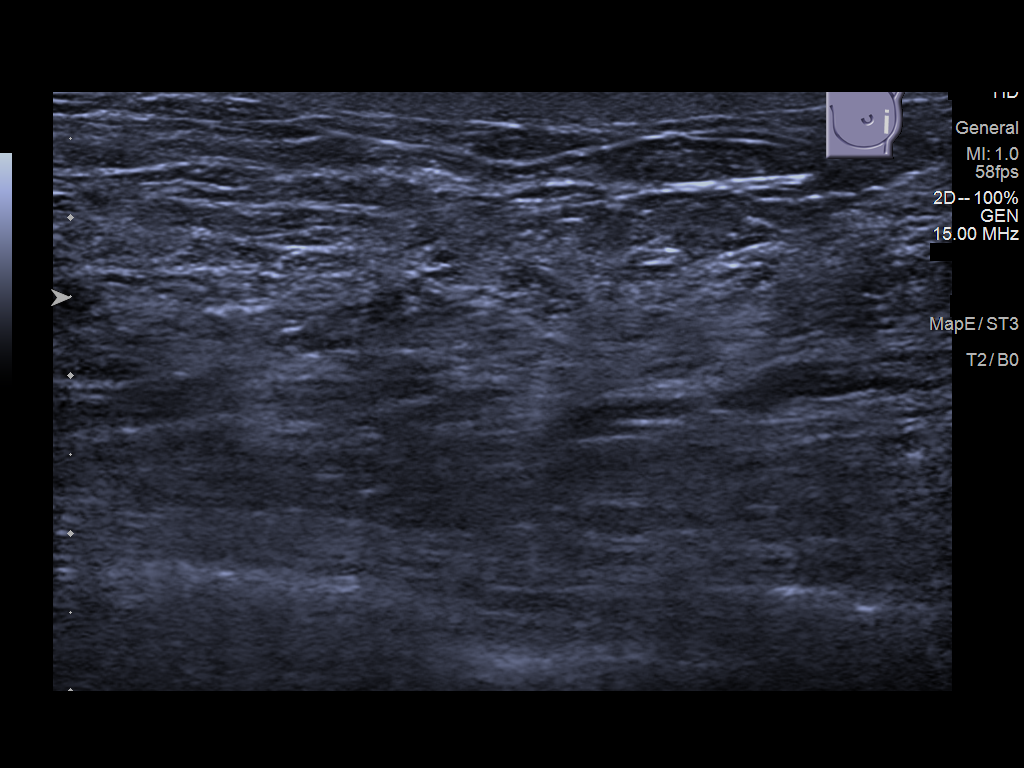
[im 4/6]
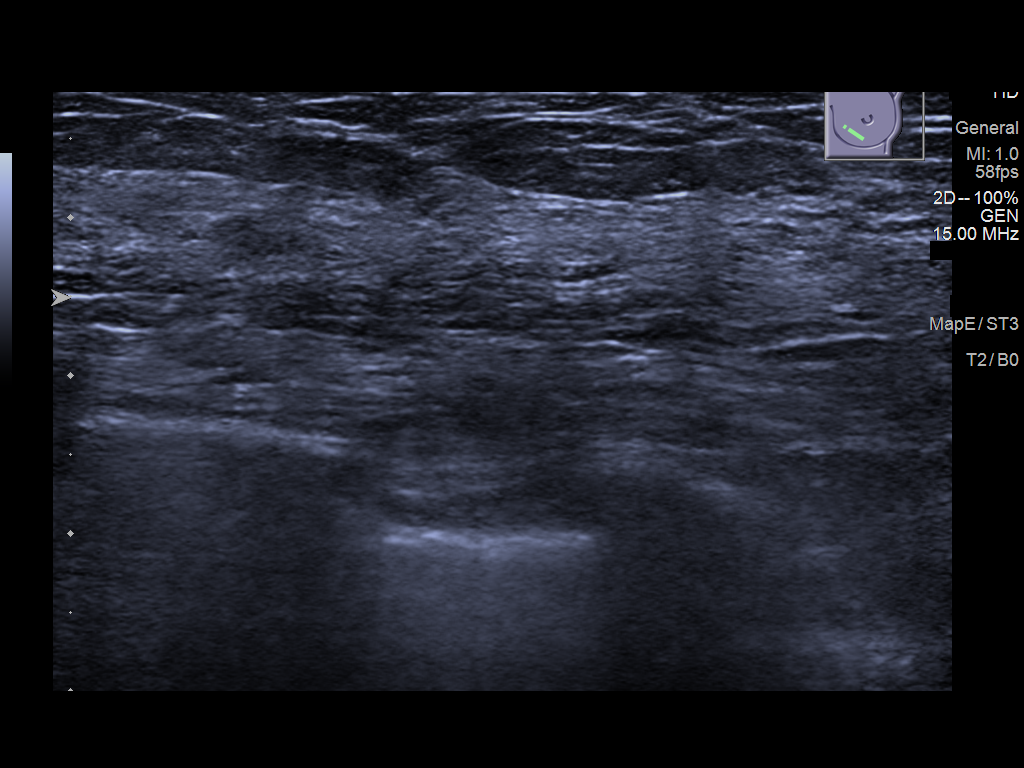
[im 5/6]
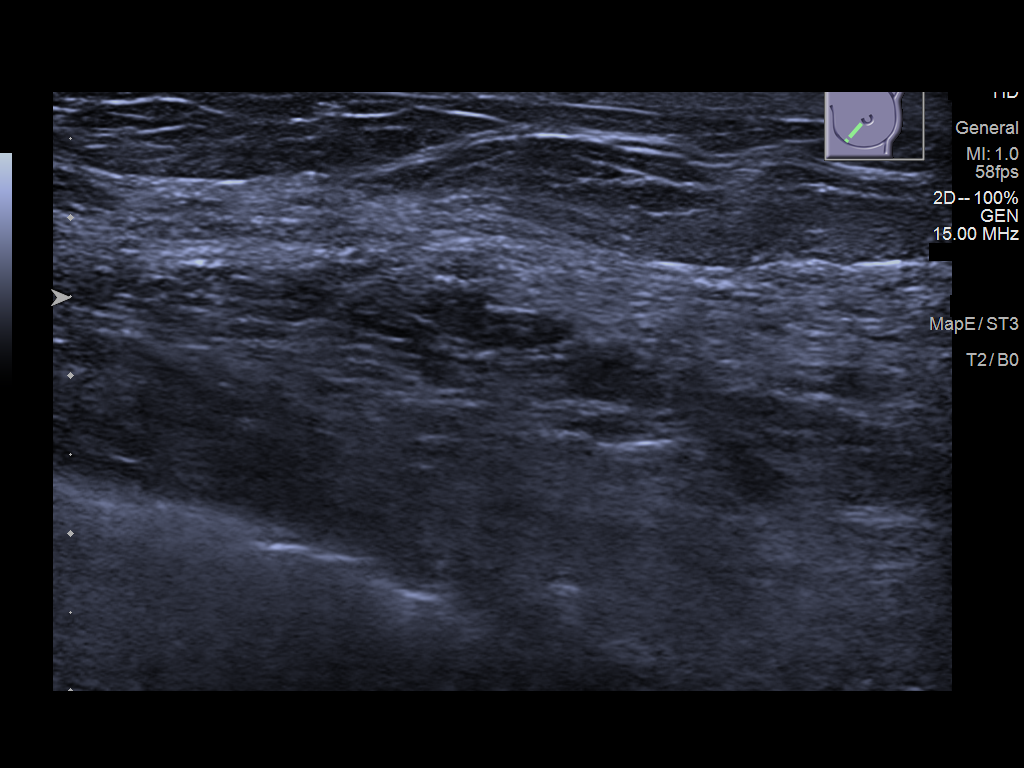
[im 6/6]
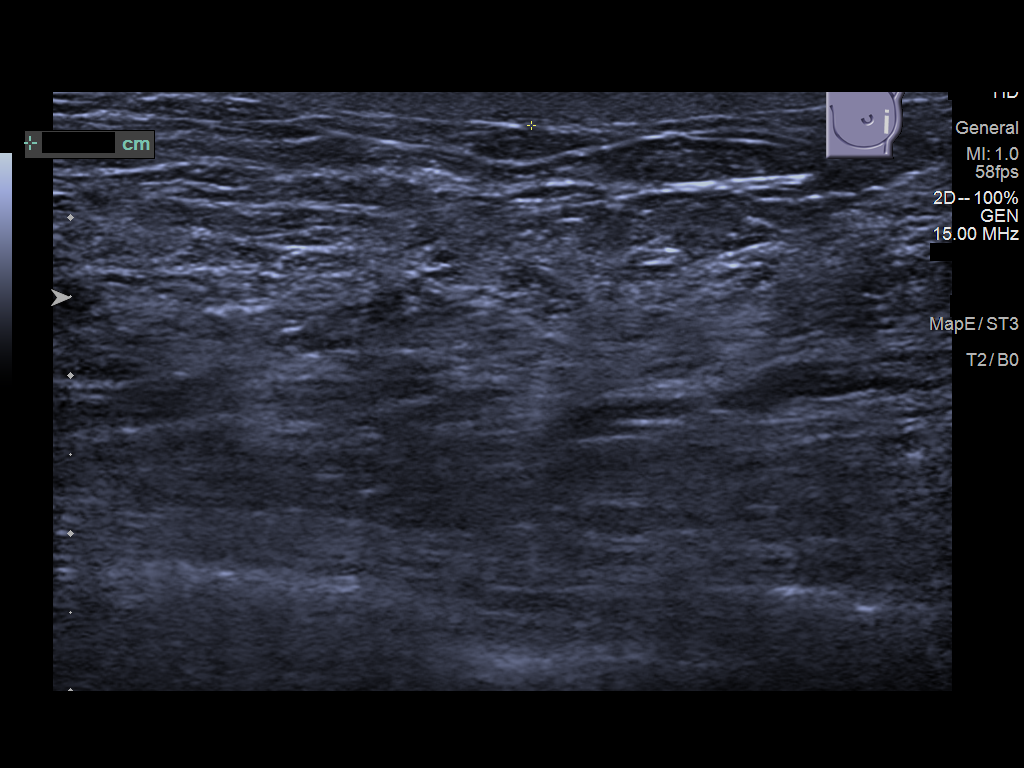

[6 of 6 positions shown; findings below may reference images not displayed]

ACR Breast Density Category c: The breast tissue is heterogeneously
dense, which may obscure small masses.
FINDINGS: Mammogram:

There is bilateral diffusely increased trabecular thickening as well
as diffusely increased asymmetric left skin thickening. No
suspicious mass or calcification identified bilaterally. Additional
spot compression tomosynthesis views were obtained at the palpable
area of concern in the outer left breast demonstrating no discrete
abnormality.

Mammographic images were processed with CAD.

On physical exam, there is asymmetric global fullness of the left
breast without a localized mass. There is no skin erythema.

Ultrasound:

Targeted ultrasound is performed in the left breast at 3 o'clock and
8 o'clock at the palpable sites of concern demonstrating no cystic
or solid mass. There is diffuse skin thickening measuring up to
cm.
IMPRESSION: Bilateral diffuse increase in trabecular thickening as well as
diffuse left breast skin thickening. No mammographic or sonographic
evidence of malignancy. The findings are nonspecific but most likely
represent edema, asymmetric on the left with skin thickening,
secondary to a systemic process such as heart failure or liver
disease. Less likely consideration for the diffuse skin thickening
is inflammatory breast cancer.

RECOMMENDATION:
1. Clinical follow-up for the left breast skin thickening. If there
is clinical concern for inflammatory breast cancer, consider skin
punch biopsy.

2.  Screening mammogram in one year.(Code:[CE])

I have discussed the findings and recommendations with the patient.
If applicable, a reminder letter will be sent to the patient
regarding the next appointment.

BI-RADS CATEGORY  2: Benign.

## 2019-03-07 IMAGING — MG DIGITAL DIAGNOSTIC BILAT W/ TOMO W/ CAD
6 of 10 series · 6 of 30 positions shown · non-contrast
Comparison: Previous exam(s).

CLINICAL DATA: Patient presents with new palpable areas of concern
in the left breast.

EXAM:
DIGITAL DIAGNOSTIC BILATERAL MAMMOGRAM WITH CAD AND TOMO
ULTRASOUND LEFT BREAST

[R CC synth-2D]
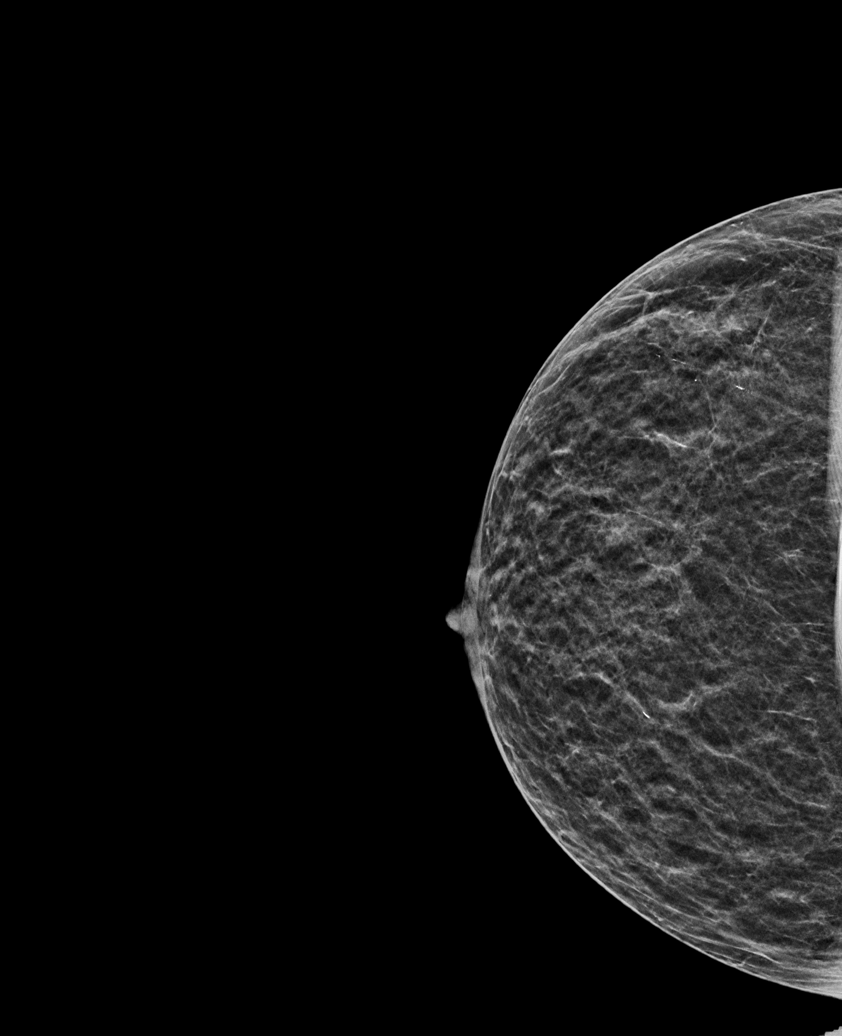

[L TAN synth-2D]
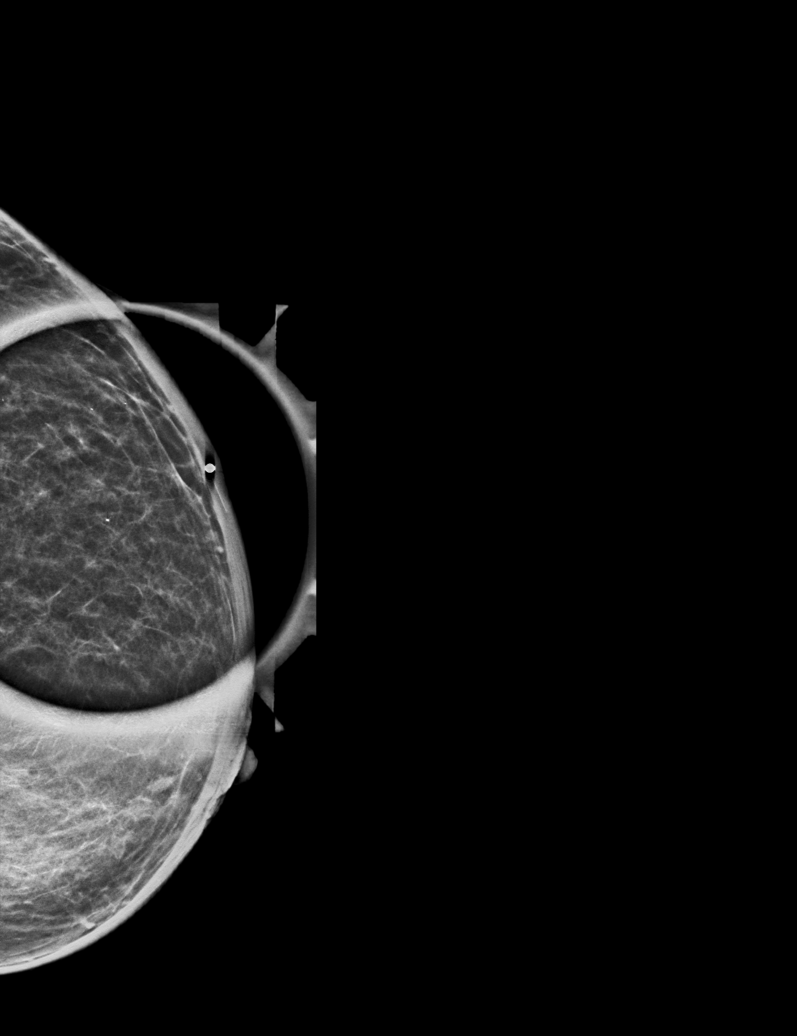

[L MLO synth-2D]
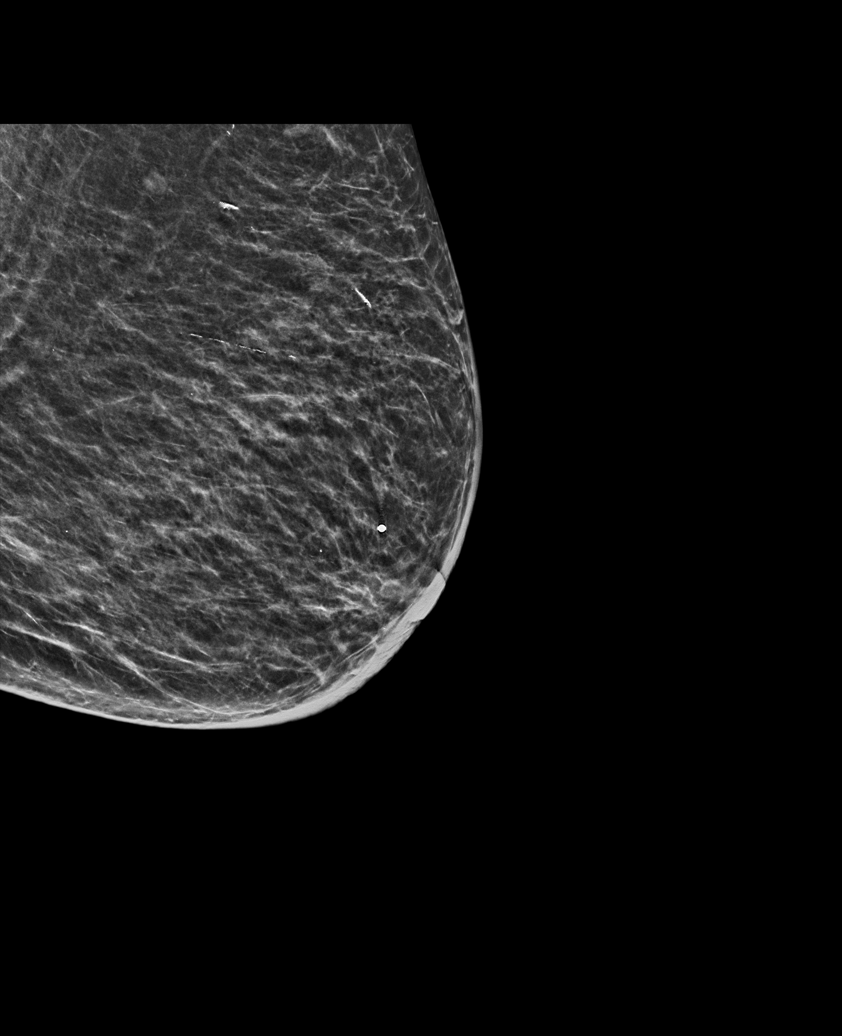

[L CC synth-2D]
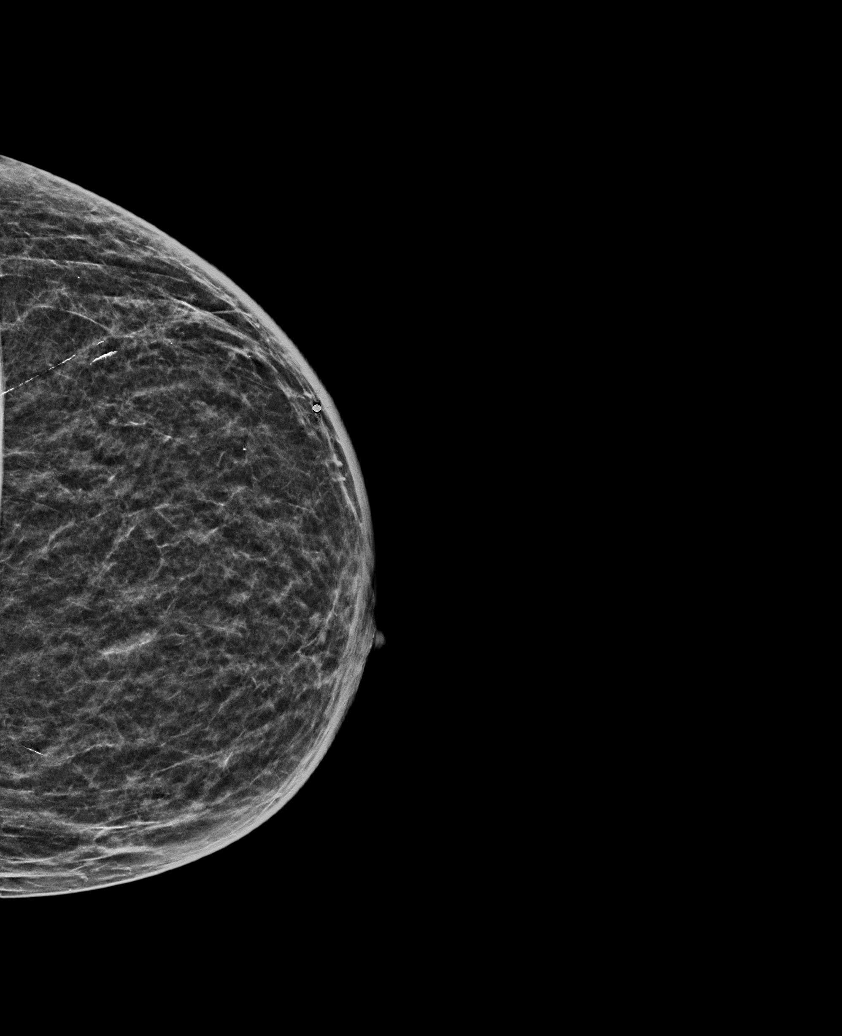

[R MLO synth-2D]
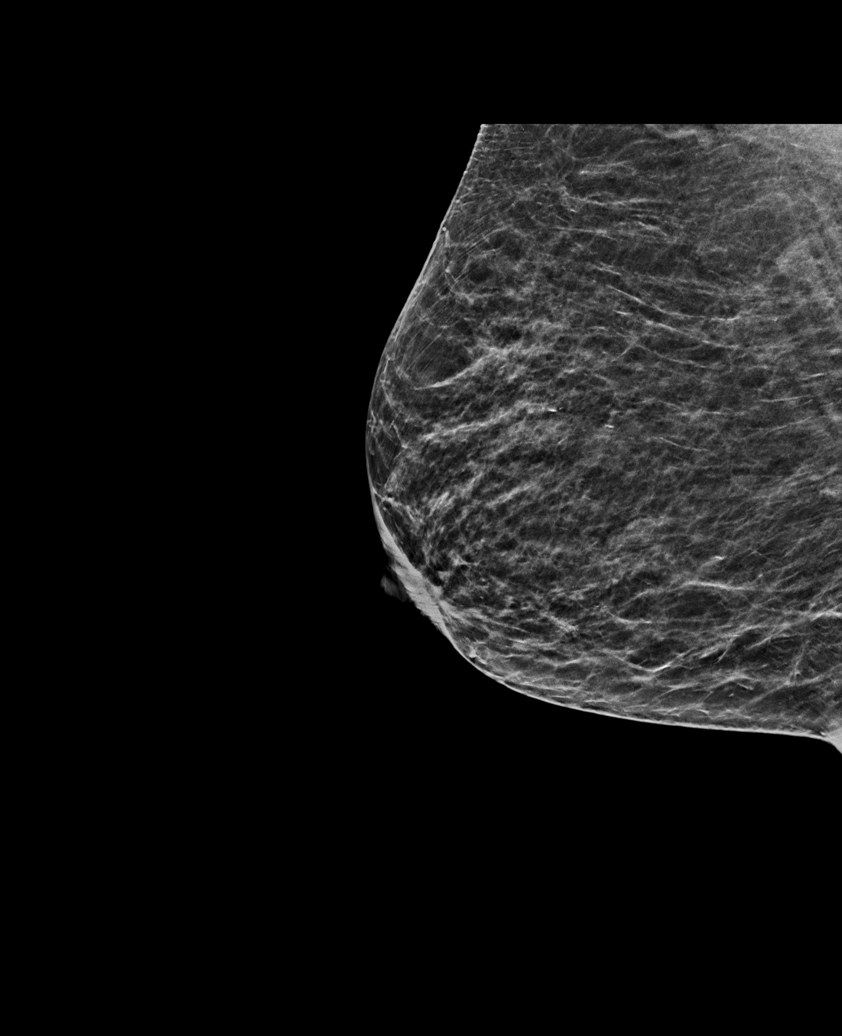

[L TAN tomo · tomo slice 19/37.0]
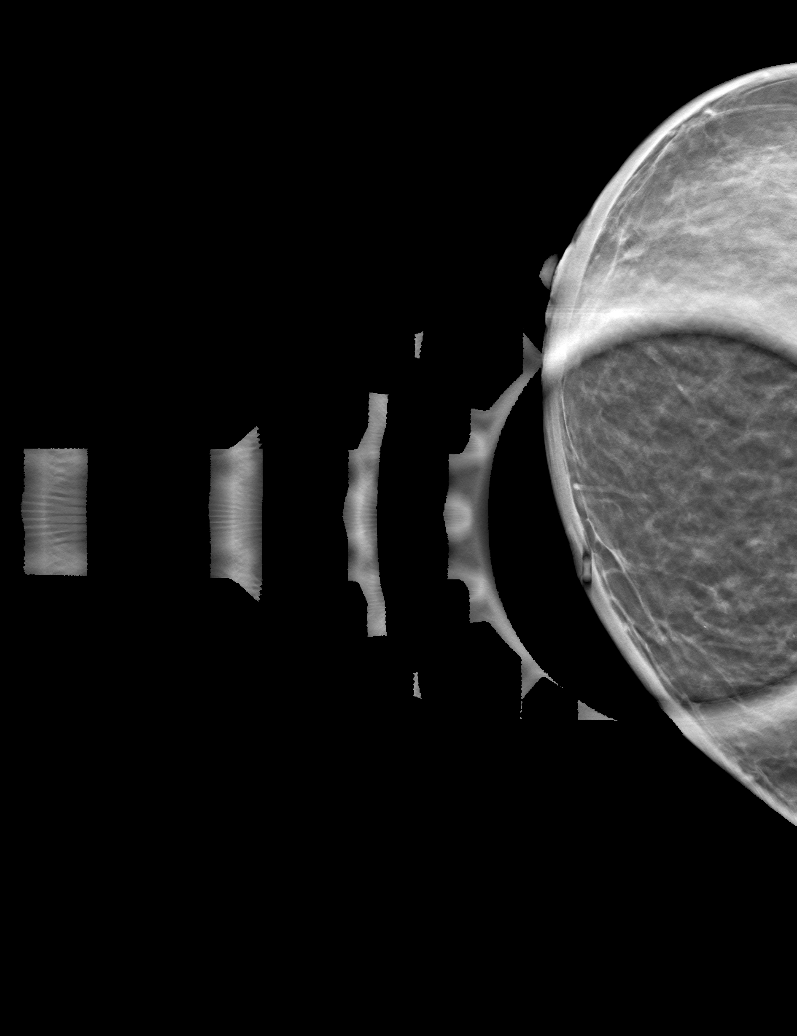

[6 of 30 positions shown; findings below may reference images not displayed]

ACR Breast Density Category c: The breast tissue is heterogeneously
dense, which may obscure small masses.
FINDINGS: Mammogram:

There is bilateral diffusely increased trabecular thickening as well
as diffusely increased asymmetric left skin thickening. No
suspicious mass or calcification identified bilaterally. Additional
spot compression tomosynthesis views were obtained at the palpable
area of concern in the outer left breast demonstrating no discrete
abnormality.

Mammographic images were processed with CAD.

On physical exam, there is asymmetric global fullness of the left
breast without a localized mass. There is no skin erythema.

Ultrasound:

Targeted ultrasound is performed in the left breast at 3 o'clock and
8 o'clock at the palpable sites of concern demonstrating no cystic
or solid mass. There is diffuse skin thickening measuring up to
cm.
IMPRESSION: Bilateral diffuse increase in trabecular thickening as well as
diffuse left breast skin thickening. No mammographic or sonographic
evidence of malignancy. The findings are nonspecific but most likely
represent edema, asymmetric on the left with skin thickening,
secondary to a systemic process such as heart failure or liver
disease. Less likely consideration for the diffuse skin thickening
is inflammatory breast cancer.

RECOMMENDATION:
1. Clinical follow-up for the left breast skin thickening. If there
is clinical concern for inflammatory breast cancer, consider skin
punch biopsy.

2.  Screening mammogram in one year.(Code:[CE])

I have discussed the findings and recommendations with the patient.
If applicable, a reminder letter will be sent to the patient
regarding the next appointment.

BI-RADS CATEGORY  2: Benign.

## 2019-03-09 ENCOUNTER — Telehealth: Payer: Self-pay | Admitting: Internal Medicine

## 2019-03-12 ENCOUNTER — Telehealth: Payer: Self-pay

## 2019-03-12 NOTE — Telephone Encounter (Signed)
Can we contact police to do wellness check since no one has been able to locate the patient? Thanks.

## 2019-03-12 NOTE — Telephone Encounter (Signed)
Yes, she does consume ETOH

## 2019-03-12 NOTE — Telephone Encounter (Signed)
Couldn't locate her? Do we need to do a wellness check via police?

## 2019-03-13 ENCOUNTER — Other Ambulatory Visit: Payer: Self-pay | Admitting: Internal Medicine

## 2019-03-14 ENCOUNTER — Telehealth: Payer: Self-pay

## 2019-03-14 NOTE — Telephone Encounter (Signed)
CONFIRMED AND SCREENED FOR 03-20-19 OV. °

## 2019-03-20 ENCOUNTER — Ambulatory Visit (INDEPENDENT_AMBULATORY_CARE_PROVIDER_SITE_OTHER): Payer: Medicare Other | Admitting: Internal Medicine

## 2019-03-20 ENCOUNTER — Other Ambulatory Visit: Payer: Self-pay

## 2019-03-20 ENCOUNTER — Encounter: Payer: Self-pay | Admitting: Internal Medicine

## 2019-03-20 VITALS — BP 136/68 | HR 76 | Resp 16 | Ht 66.0 in | Wt 142.0 lb

## 2019-03-20 DIAGNOSIS — R5383 Other fatigue: Secondary | ICD-10-CM | POA: Diagnosis not present

## 2019-03-20 DIAGNOSIS — K7031 Alcoholic cirrhosis of liver with ascites: Secondary | ICD-10-CM | POA: Diagnosis not present

## 2019-03-20 DIAGNOSIS — Z0001 Encounter for general adult medical examination with abnormal findings: Secondary | ICD-10-CM | POA: Diagnosis not present

## 2019-03-20 DIAGNOSIS — I4891 Unspecified atrial fibrillation: Secondary | ICD-10-CM

## 2019-03-20 DIAGNOSIS — G622 Polyneuropathy due to other toxic agents: Secondary | ICD-10-CM

## 2019-03-20 DIAGNOSIS — F320 Major depressive disorder, single episode, mild: Secondary | ICD-10-CM

## 2019-03-20 MED ORDER — ESCITALOPRAM OXALATE 10 MG PO TABS: 10.0000 mg | ORAL_TABLET | Freq: Every day | ORAL | 2 refills | Status: DC

## 2019-03-20 MED ORDER — SPIRONOLACTONE 25 MG PO TABS: 25.0000 mg | ORAL_TABLET | Freq: Every day | ORAL | 3 refills | Status: DC

## 2019-03-20 NOTE — Progress Notes (Signed)
[facility] [place], [place] [address]  Internal MEDICINE  Office Visit Note  Patient Name: Adriana Miller  [id number]  [id number]  Date of Service: 03/28/2019  Chief Complaint  Patient presents with  . Annual Exam    mammogram, put all meds in system.   Marland Kitchen Hypertension  . Gastroesophageal Reflux  . Referral    would like home health   . Fatigue    would like something for energy   HPI Pt is here for routine health maintenance examination. She is c/o being weak and difficulty walking. She is not consuming alcohol any more. Mammogram was normal, there is some fluid in her subcutaneous tissue of breast.. thought to be due to ascites. Her blood pressure is better after stopping metoprolol. Now on Cardizem and Eliquis for atrial fibrillation. Feels depressed and tired most of the time, will like to get home aide. Recent hospital admission for cellulitis left leg and bloody diarrhea. Recovered now. 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   She feels depressed  Current Medication: Outpatient Encounter Medications as of 03/20/2019  Medication Sig  . apixaban (ELIQUIS) 5 MG TABS tablet Take 5 mg by mouth 2 (two) times daily.  . furosemide (LASIX) 20 MG tablet TAKE 1 TABLET BY MOUTH EVERY DAY  . pantoprazole (PROTONIX) 40 MG tablet Take 1 tablet (40 mg total) by mouth 2 (two) times daily.  Marland Kitchen diltiazem (CARDIZEM CD) 120 MG 24 hr capsule Take 1 capsule (120 mg total) by mouth daily.  Marland Kitchen escitalopram (LEXAPRO) 10 MG tablet Take 1 tablet (10 mg total) by mouth daily. For anxiety/sleep  . spironolactone (ALDACTONE) 25 MG tablet Take 1 tablet (25 mg total) by mouth daily. For cirrhosis of liver   No facility-administered encounter medications on file as of 03/20/2019.     Surgical History: Past Surgical History:  Procedure Laterality Date  . CEREBRAL ANEURYSM REPAIR  1991  . COLONOSCOPY WITH PROPOFOL N/A 02/12/2019   Procedure: COLONOSCOPY WITH PROPOFOL;   Surgeon: Lin Landsman, MD;  Location: [facility] ENDOSCOPY;  Service: Gastroenterology;  Laterality: N/A;  . ESOPHAGOGASTRODUODENOSCOPY (EGD) WITH PROPOFOL N/A 02/12/2019   Procedure: ESOPHAGOGASTRODUODENOSCOPY (EGD) WITH PROPOFOL;  Surgeon: Lin Landsman, MD;  Location: [facility] ENDOSCOPY;  Service: Gastroenterology;  Laterality: N/A;    Medical History: Past Medical History:  Diagnosis Date  . Cerebral aneurysm   . Chronic kidney disease   . Diabetes mellitus without complication ([place])   . Hyperlipidemia   . Hypertension   . Seizure disorder [facility])     Family History: Family History  Problem Relation Age of Onset  . Heart attack Brother 63   Review of Systems  Constitutional: Negative for chills, diaphoresis and fatigue.  HENT: Negative for ear pain, postnasal drip and sinus pressure.   Eyes: Negative for photophobia, discharge, redness, itching and visual disturbance.  Respiratory: Negative for cough, shortness of breath and wheezing.   Cardiovascular: Negative for chest pain, palpitations and leg swelling.  Gastrointestinal: Negative for abdominal pain, constipation, diarrhea, nausea and vomiting.  Genitourinary: Negative for dysuria and flank pain.  Musculoskeletal: Negative for arthralgias, back pain, gait problem and neck pain.  Skin: Negative for color change.  Allergic/Immunologic: Negative for environmental allergies and food allergies.  Neurological: Negative for dizziness and headaches.  Hematological: Does not bruise/bleed easily.  Psychiatric/Behavioral: Negative for agitation, behavioral problems (depression) and hallucinations.   Vital Signs: BP 136/68   Pulse 76   Resp 16   Ht 5\' 6"  (  1.676 m)   Wt 142 lb (64.4 kg)   SpO2 100%   BMI 22.92 kg/m    Physical Exam Constitutional:      General: She is not in acute distress.    Appearance: She is well-developed. She is not diaphoretic.  HENT:     Head: Normocephalic and atraumatic.     Mouth/Throat:      Pharynx: No oropharyngeal exudate.  Eyes:     Pupils: Pupils are equal, round, and reactive to light.  Neck:     Musculoskeletal: Normal range of motion and neck supple.     Thyroid: No thyromegaly.     Vascular: No JVD.     Trachea: No tracheal deviation.  Cardiovascular:     Rate and Rhythm: Normal rate and regular rhythm.     Heart sounds: Normal heart sounds. No murmur. No friction rub. No gallop.   Pulmonary:     Effort: Pulmonary effort is normal. No respiratory distress.     Breath sounds: No wheezing or rales.  Chest:     Chest wall: No tenderness.  Abdominal:     General: Bowel sounds are normal.     Palpations: Abdomen is soft.  Musculoskeletal: Normal range of motion.  Lymphadenopathy:     Cervical: No cervical adenopathy.  Skin:    General: Skin is warm and dry.  Neurological:     Mental Status: She is alert and oriented to person, place, and time.     Cranial Nerves: No cranial nerve deficit.  Psychiatric:        Behavior: Behavior normal.        Thought Content: Thought content normal.        Judgment: Judgment normal.     LABS: Recent Results (from the past 2160 hour(s))  TSH     Status: None   Collection Time: 01/08/19  4:46 PM  Result Value Ref Range   TSH 2.104 0.350 - 4.500 uIU/mL    Comment: Performed by a 3rd Generation assay with a functional sensitivity of <=0.01 uIU/mL. Performed at [facility], [address] [place], [place] [id number]   Basic metabolic panel     Status: Abnormal   Collection Time: 01/08/19  4:46 PM  Result Value Ref Range   Sodium 135 135 - 145 mmol/L   Potassium 4.6 3.5 - 5.1 mmol/L   Chloride 101 98 - 111 mmol/L   CO2 22 22 - 32 mmol/L   Glucose, Bld 104 (H) 70 - 99 mg/dL   BUN 10 8 - 23 mg/dL   Creatinine, Ser 1.06 (H) 0.44 - 1.00 mg/dL   Calcium 8.9 8.9 - 10.3 mg/dL   GFR calc non Af Amer 55 (L) >60 mL/min   GFR calc Af Amer >60 >60 mL/min   Anion gap 12 5 - 15    Comment: Performed at [facility], [address], [place], [place] [address]  Magnesium     Status: None   Collection Time: 01/15/19  4:00 PM  Result Value Ref Range   Magnesium 1.7 1.7 - 2.4 mg/dL    Comment: Performed at [facility], [address] [place], [place] [id number]  Basic metabolic panel     Status: Abnormal   Collection Time: 01/15/19  4:00 PM  Result Value Ref Range   Sodium 139 135 - 145 mmol/L   Potassium 4.0 3.5 - 5.1 mmol/L   Chloride 98 98 - 111 mmol/L   CO2 27 22 -  32 mmol/L   Glucose, Bld 118 (H) 70 - 99 mg/dL   BUN 11 8 - 23 mg/dL   Creatinine, Ser 0.86 0.44 - 1.00 mg/dL   Calcium 8.9 8.9 - 10.3 mg/dL   GFR calc non Af Amer >60 >60 mL/min   GFR calc Af Amer >60 >60 mL/min   Anion gap 14 5 - 15    Comment: Performed at [facility], [address] [place], [place] [address]  CBC with Differential     Status: Abnormal   Collection Time: 02/08/19  3:51 PM  Result Value Ref Range   WBC 5.6 4.0 - 10.5 K/uL   RBC 3.55 (L) 3.87 - 5.11 MIL/uL   Hemoglobin 11.7 (L) 12.0 - 15.0 g/dL   HCT 34.8 (L) 36.0 - 46.0 %   MCV 98.0 80.0 - 100.0 fL   MCH 33.0 26.0 - 34.0 pg   MCHC 33.6 30.0 - 36.0 g/dL   RDW 13.9 11.5 - 15.5 %   Platelets 43 (L) 150 - 400 K/uL    Comment: PLATELET COUNT CONFIRMED BY SMEAR Immature Platelet Fraction may be clinically indicated, consider ordering this additional test [id number]    nRBC 0.0 0.0 - 0.2 %   Neutrophils Relative % 82 %   Neutro Abs 4.6 1.7 - 7.7 K/uL   Lymphocytes Relative 8 %   Lymphs Abs 0.5 (L) 0.7 - 4.0 K/uL   Monocytes Relative 9 %   Monocytes Absolute 0.5 0.1 - 1.0 K/uL   Eosinophils Relative 0 %   Eosinophils Absolute 0.0 0.0 - 0.5 K/uL   Basophils Relative 0 %   Basophils Absolute 0.0 0.0 - 0.1 K/uL   Immature Granulocytes 1 %   Abs Immature Granulocytes 0.04 0.00 - 0.07 K/uL    Comment: Performed at [facility], [address] [place], [place] [address]  Comprehensive metabolic panel     Status:  Abnormal   Collection Time: 02/08/19  3:51 PM  Result Value Ref Range   Sodium 133 (L) 135 - 145 mmol/L   Potassium 2.9 (L) 3.5 - 5.1 mmol/L   Chloride 91 (L) 98 - 111 mmol/L   CO2 22 22 - 32 mmol/L   Glucose, Bld 108 (H) 70 - 99 mg/dL   BUN 8 8 - 23 mg/dL   Creatinine, Ser 1.10 (H) 0.44 - 1.00 mg/dL   Calcium 7.6 (L) 8.9 - 10.3 mg/dL   Total Protein 8.6 (H) 6.5 - 8.1 g/dL   Albumin 2.9 (L) 3.5 - 5.0 g/dL   AST 72 (H) 15 - 41 U/L   ALT 25 0 - 44 U/L   Alkaline Phosphatase 50 38 - 126 U/L   Total Bilirubin 3.5 (H) 0.3 - 1.2 mg/dL   GFR calc non Af Amer 53 (L) >60 mL/min   GFR calc Af Amer >60 >60 mL/min   Anion gap 20 (H) 5 - 15    Comment: Performed at [facility], [address] [place], [place] [address]  Lipase, blood     Status: None   Collection Time: 02/08/19  3:51 PM  Result Value Ref Range   Lipase 36 11 - 51 U/L    Comment: Performed at [facility], [address] [place],  [address]  Magnesium     Status: Abnormal   Collection Time: 02/08/19  3:51 PM  Result Value Ref Range   Magnesium 1.1 (L) 1.7 - 2.4 mg/dL    Comment: Performed at [facility],  [place], [place] [address]  Protime-INR     Status: Abnormal   Collection Time: 02/08/19  3:51 PM  Result Value Ref Range   Prothrombin Time 27.2 (H) 11.4 - 15.2 seconds   INR 2.6 (H) 0.8 - 1.2    Comment: (NOTE) INR goal varies based on device and disease states. Performed at [facility], [address] [place], [place] [address]   Ethanol     Status: None   Collection Time: 02/08/19  3:51 PM  Result Value Ref Range   Alcohol, Ethyl (B) <10 <10 mg/dL    Comment: (NOTE) Lowest detectable limit for serum alcohol is 10 mg/dL. For medical purposes only. Performed at [facility], [place], [place] [address]   SARS CORONAVIRUS 2 (TAT 6-24 HRS) Nasopharyngeal Nasopharyngeal Swab     Status: None   Collection Time: 02/08/19   6:53 PM   Specimen: Nasopharyngeal Swab  Result Value Ref Range   SARS Coronavirus 2 NEGATIVE NEGATIVE    Comment: (NOTE) SARS-CoV-2 target nucleic acids are NOT DETECTED. The SARS-CoV-2 RNA is generally detectable in upper and lower respiratory specimens during the acute phase of infection. Negative results do not preclude SARS-CoV-2 infection, do not rule out co-infections with other pathogens, and should not be used as the sole basis for treatment or other patient management decisions. Negative results must be combined with clinical observations, patient history, and epidemiological information. The expected result is Negative. Fact Sheet for Patients: SugarRoll.be Fact Sheet for Healthcare Providers: https://www.woods-mathews.com/ This test is not yet approved or cleared by the [place] FDA and  has been authorized for detection and/or diagnosis of SARS-CoV-2 by FDA under an Emergency Use Authorization (EUA). This EUA will remain  in effect (meaning this test can be used) for the duration of the COVID-19 declaration under Section 56 4(b)(1) of the Act, 21 U.S.C. section 360bbb-3(b)(1), unless the authorization is terminated or revoked sooner. Performed at [facility], [place] [address] [place], [place] [address]   CBC     Status: Abnormal   Collection Time: 02/08/19  9:59 PM  Result Value Ref Range   WBC 5.4 4.0 - 10.5 K/uL   RBC 3.12 (L) 3.87 - 5.11 MIL/uL   Hemoglobin 10.3 (L) 12.0 - 15.0 g/dL   HCT 30.4 (L) 36.0 - 46.0 %   MCV 97.4 80.0 - 100.0 fL   MCH 33.0 26.0 - 34.0 pg   MCHC 33.9 30.0 - 36.0 g/dL   RDW 14.0 11.5 - 15.5 %   Platelets 36 (L) 150 - 400 K/uL    Comment: Immature Platelet Fraction may be clinically indicated, consider ordering this additional test [id number]    nRBC 0.0 0.0 - 0.2 %    Comment: Performed at [facility], [address] [place], [place] [address]  Comprehensive metabolic  panel     Status: Abnormal   Collection Time: 02/09/19 12:20 AM  Result Value Ref Range   Sodium 129 (L) 135 - 145 mmol/L   Potassium 3.1 (L) 3.5 - 5.1 mmol/L   Chloride 91 (L) 98 - 111 mmol/L   CO2 26 22 - 32 mmol/L   Glucose, Bld 183 (H) 70 - 99 mg/dL   BUN 11 8 - 23 mg/dL   Creatinine, Ser 1.17 (H) 0.44 - 1.00 mg/dL   Calcium 7.3 (L) 8.9 - 10.3 mg/dL   Total Protein 7.7 6.5 - 8.1 g/dL   Albumin 2.5 (L) 3.5 -  5.0 g/dL   AST 59 (H) 15 - 41 U/L   ALT 21 0 - 44 U/L   Alkaline Phosphatase 42 38 - 126 U/L   Total Bilirubin 3.0 (H) 0.3 - 1.2 mg/dL   GFR calc non Af Amer 49 (L) >60 mL/min   GFR calc Af Amer 57 (L) >60 mL/min   Anion gap 12 5 - 15    Comment: Performed at [facility], [address], [place], [place] [address]  Magnesium     Status: Abnormal   Collection Time: 02/09/19 12:20 AM  Result Value Ref Range   Magnesium 1.4 (L) 1.7 - 2.4 mg/dL    Comment: Performed at [facility], [address] [place], [place] [address]  Phosphorus     Status: None   Collection Time: 02/09/19 12:20 AM  Result Value Ref Range   Phosphorus 2.9 2.5 - 4.6 mg/dL    Comment: Performed at [facility], [address] [place], [place] [address]  HIV Antibody (routine testing w rflx)     Status: None   Collection Time: 02/09/19 12:57 AM  Result Value Ref Range   HIV Screen 4th Generation wRfx NON REACTIVE NON REACTIVE    Comment: Performed at [facility] [address] [place], [place] [address]  Hemoglobin and hematocrit, blood     Status: Abnormal   Collection Time: 02/09/19 12:57 AM  Result Value Ref Range   Hemoglobin 10.8 (L) 12.0 - 15.0 g/dL   HCT 32.1 (L) 36.0 - 46.0 %    Comment: Performed at [facility], [address] [place], [place] [address]  Comprehensive metabolic panel     Status: Abnormal   Collection Time: 02/09/19  6:34 AM  Result Value Ref Range   Sodium 130 (L) 135 - 145 mmol/L   Potassium 3.5 3.5 - 5.1 mmol/L    Chloride 95 (L) 98 - 111 mmol/L   CO2 26 22 - 32 mmol/L   Glucose, Bld 124 (H) 70 - 99 mg/dL   BUN 12 8 - 23 mg/dL   Creatinine, Ser 1.14 (H) 0.44 - 1.00 mg/dL   Calcium 7.0 (L) 8.9 - 10.3 mg/dL   Total Protein 6.5 6.5 - 8.1 g/dL   Albumin 2.1 (L) 3.5 - 5.0 g/dL   AST 45 (H) 15 - 41 U/L   ALT 18 0 - 44 U/L   Alkaline Phosphatase 34 (L) 38 - 126 U/L   Total Bilirubin 2.2 (H) 0.3 - 1.2 mg/dL   GFR calc non Af Amer 50 (L) >60 mL/min   GFR calc Af Amer 58 (L) >60 mL/min   Anion gap 9 5 - 15    Comment: Performed at [facility], [address] [place], [place] [address]  CBC     Status: Abnormal   Collection Time: 02/09/19  6:34 AM  Result Value Ref Range   WBC 4.6 4.0 - 10.5 K/uL   RBC 2.91 (L) 3.87 - 5.11 MIL/uL   Hemoglobin 9.7 (L) 12.0 - 15.0 g/dL   HCT 28.6 (L) 36.0 - 46.0 %   MCV 98.3 80.0 - 100.0 fL   MCH 33.3 26.0 - 34.0 pg   MCHC 33.9 30.0 - 36.0 g/dL   RDW 13.9 11.5 - 15.5 %   Platelets 29 (LL) 150 - 400 K/uL    Comment: REPEATED TO VERIFY PLATELET COUNT CONFIRMED BY SMEAR Immature Platelet Fraction may be clinically indicated, consider ordering this additional test [id number] THIS  CRITICAL RESULT HAS VERIFIED AND BEEN CALLED TO LILA MILAS RN BY RASHA HASSAN ON 10 23 2020 AT [id number], AND HAS BEEN READ BACK.     nRBC 0.0 0.0 - 0.2 %    Comment: Performed at [facility], [address] [place], [place] [address]  Magnesium     Status: Abnormal   Collection Time: 02/09/19  6:34 AM  Result Value Ref Range   Magnesium 2.7 (H) 1.7 - 2.4 mg/dL    Comment: Performed at [facility], [address] [place], [place] [address]  Hemoglobin and hematocrit, blood     Status: Abnormal   Collection Time: 02/09/19 12:14 PM  Result Value Ref Range   Hemoglobin 9.4 (L) 12.0 - 15.0 g/dL   HCT 27.0 (L) 36.0 - 46.0 %    Comment: Performed at [facility], [address], [place], [place] [id number]  Basic metabolic panel     Status: Abnormal    Collection Time: 02/10/19  4:30 AM  Result Value Ref Range   Sodium 134 (L) 135 - 145 mmol/L   Potassium 3.5 3.5 - 5.1 mmol/L   Chloride 100 98 - 111 mmol/L   CO2 24 22 - 32 mmol/L   Glucose, Bld 82 70 - 99 mg/dL   BUN 13 8 - 23 mg/dL   Creatinine, Ser 1.02 (H) 0.44 - 1.00 mg/dL   Calcium 7.4 (L) 8.9 - 10.3 mg/dL   GFR calc non Af Amer 58 (L) >60 mL/min   GFR calc Af Amer >60 >60 mL/min   Anion gap 10 5 - 15    Comment: Performed at [facility], [address] [place], [place] [address]  Magnesium     Status: None   Collection Time: 02/10/19  4:30 AM  Result Value Ref Range   Magnesium 2.3 1.7 - 2.4 mg/dL    Comment: Performed at [facility], [address] [place], [place] [address]  CBC     Status: Abnormal   Collection Time: 02/10/19  4:30 AM  Result Value Ref Range   WBC 3.5 (L) 4.0 - 10.5 K/uL   RBC 2.90 (L) 3.87 - 5.11 MIL/uL   Hemoglobin 9.5 (L) 12.0 - 15.0 g/dL   HCT 28.3 (L) 36.0 - 46.0 %   MCV 97.6 80.0 - 100.0 fL   MCH 32.8 26.0 - 34.0 pg   MCHC 33.6 30.0 - 36.0 g/dL   RDW 14.1 11.5 - 15.5 %   Platelets 40 (L) 150 - 400 K/uL    Comment: Immature Platelet Fraction may be clinically indicated, consider ordering this additional test [id number]    nRBC 0.0 0.0 - 0.2 %    Comment: Performed at [facility], [address] [place], [place] [address]  Vitamin B12     Status: Abnormal   Collection Time: 02/10/19  4:30 AM  Result Value Ref Range   Vitamin B-12 968 (H) 180 - 914 pg/mL    Comment: (NOTE) This assay is not validated for testing neonatal or myeloproliferative syndrome specimens for Vitamin B12 levels. Performed at [facility], [place] [address], [place], [place] [address]   Potassium     Status: Abnormal   Collection Time: 02/11/19  6:16 AM  Result Value Ref Range   Potassium 3.4 (L) 3.5 - 5.1 mmol/L    Comment: Performed at [facility], [address], [place],  [address]  Magnesium     Status:  None  Collection Time: 02/11/19  6:16 AM  Result Value Ref Range   Magnesium 2.0 1.7 - 2.4 mg/dL    Comment: Performed at [facility], [address] [place], [place] [address]  Ferritin     Status: Abnormal   Collection Time: 02/11/19  6:16 AM  Result Value Ref Range   Ferritin 427 (H) 11 - 307 ng/mL    Comment: Performed at [facility], [address] [place], [place] [address]  Iron and TIBC     Status: Abnormal   Collection Time: 02/11/19  6:16 AM  Result Value Ref Range   Iron 76 28 - 170 ug/dL   TIBC 122 (L) 250 - 450 ug/dL   Saturation Ratios 62 (H) 10.4 - 31.8 %   UIBC 46 ug/dL    Comment: Performed at [facility], [address] [place], [place] [address]  Folate     Status: None   Collection Time: 02/11/19  6:16 AM  Result Value Ref Range   Folate 9.7 >5.9 ng/mL    Comment: Performed at [facility], [address] [place], [place] [address]  Hepatitis panel, acute     Status: Abnormal   Collection Time: 02/11/19  4:16 PM  Result Value Ref Range   Hepatitis B Surface Ag NON REACTIVE NON REACTIVE   HCV Ab Reactive (A) NON REACTIVE    Comment: (NOTE) The CDC recommends that a Reactive HCV antibody result be followed up  with a HCV Nucleic Acid Amplification test.    Hep A IgM NON REACTIVE NON REACTIVE   Hep B C IgM NON REACTIVE NON REACTIVE    Comment: Performed at [facility], [place] [address] [place], [place] [id number]  Basic metabolic panel     Status: Abnormal   Collection Time: 02/12/19  4:29 AM  Result Value Ref Range   Sodium 136 135 - 145 mmol/L   Potassium 4.2 3.5 - 5.1 mmol/L   Chloride 102 98 - 111 mmol/L   CO2 24 22 - 32 mmol/L   Glucose, Bld 123 (H) 70 - 99 mg/dL   BUN 9 8 - 23 mg/dL   Creatinine, Ser 0.91 0.44 - 1.00 mg/dL   Calcium 8.3 (L) 8.9 - 10.3 mg/dL   GFR calc non Af Amer >60 >60 mL/min   GFR calc Af Amer >60 >60 mL/min   Anion gap 10 5 - 15    Comment: Performed at [facility], [address] [place], [place] [address]  Magnesium     Status: None   Collection Time: 02/12/19  4:29 AM  Result Value Ref Range   Magnesium 1.8 1.7 - 2.4 mg/dL    Comment: Performed at [facility], [address] [place],  [address]  CBC     Status: Abnormal   Collection Time: 02/12/19  4:29 AM  Result Value Ref Range   WBC 3.9 (L) 4.0 - 10.5 K/uL   RBC 2.94 (L) 3.87 - 5.11 MIL/uL   Hemoglobin 9.8 (L) 12.0 - 15.0 g/dL   HCT 29.6 (L) 36.0 - 46.0 %   MCV 100.7 (H) 80.0 - 100.0 fL   MCH 33.3 26.0 - 34.0 pg   MCHC 33.1 30.0 - 36.0 g/dL   RDW 14.3 11.5 - 15.5 %   Platelets 75 (L) 150 - 400 K/uL    Comment: Immature Platelet Fraction may be clinically indicated, consider ordering this additional test [id number]    nRBC 0.0 0.0 -  0.2 %    Comment: Performed at [facility], [address] [place], [place] [address]  Surgical pathology     Status: None   Collection Time: 02/12/19  3:38 PM  Result Value Ref Range   SURGICAL PATHOLOGY      SURGICAL PATHOLOGY CASE: [id number] PATIENT: Riki Altes Surgical Pathology Report     Specimen Submitted: A. Colon polyp, ascending; cold snare B. Colon polyp x2, descending; cold snare  Clinical History: Blood in stool.  Portal gastropathy, prepyloric bleeding erosion, hiatal hernia, colon polyps, external hemorrhoids    DIAGNOSIS: A. COLON POLYP, ASCENDING; COLD SNARE: - TUBULAR ADENOMA. - NEGATIVE FOR HIGH-GRADE DYSPLASIA AND MALIGNANCY.  B. COLON POLYPS X2, DESCENDING; COLD SNARE: - FRAGMENTS (X3) OF TUBULAR ADENOMAS. - NEGATIVE FOR HIGH-GRADE DYSPLASIA AND MALIGNANCY.  GROSS DESCRIPTION: A. Labeled: Ascending colon polyp, cold snare Received: In formalin Tissue fragment(s): 1 Size: 0.3 cm Description: Tan soft tissue fragment Entirely submitted in 1 cassette.  B. Labeled: Descending colon polyp x2, cold snare Received: In formalin Tissue fragment(s): 3 Size: From 0.1-1.0  cm Description: Tan soft tissue fragments Entirely submitted in 1  cassette.   Final Diagnosis performed by Allena Napoleon, MD.   Electronically signed 02/14/2019 8:30:25AM The electronic signature indicates that the named Attending Pathologist has evaluated the specimen Technical component performed at [facility], [address] [place], [place] [address] Lab: [phone] Dir: Rush Farmer, MD, MMM  Professional component performed at [facility], [facility], [address], [place], [place] [address] Lab: [phone] Dir: Dellia Nims. Rubinas, MD   Magnesium     Status: None   Collection Time: 02/13/19  4:06 AM  Result Value Ref Range   Magnesium 1.9 1.7 - 2.4 mg/dL    Comment: Performed at [facility], [address] [place], [place] [id number]  Basic metabolic panel     Status: Abnormal   Collection Time: 02/13/19  4:06 AM  Result Value Ref Range   Sodium 136 135 - 145 mmol/L   Potassium 3.8 3.5 - 5.1 mmol/L   Chloride 104 98 - 111 mmol/L   CO2 25 22 - 32 mmol/L   Glucose, Bld 87 70 - 99 mg/dL   BUN 7 (L) 8 - 23 mg/dL   Creatinine, Ser 0.88 0.44 - 1.00 mg/dL   Calcium 8.2 (L) 8.9 - 10.3 mg/dL   GFR calc non Af Amer >60 >60 mL/min   GFR calc Af Amer >60 >60 mL/min   Anion gap 7 5 - 15    Comment: Performed at [facility], [address] [place], [place] [address]  CBC     Status: Abnormal   Collection Time: 02/13/19  4:06 AM  Result Value Ref Range   WBC 4.0 4.0 - 10.5 K/uL   RBC 2.69 (L) 3.87 - 5.11 MIL/uL   Hemoglobin 8.9 (L) 12.0 - 15.0 g/dL   HCT 27.7 (L) 36.0 - 46.0 %   MCV 103.0 (H) 80.0 - 100.0 fL   MCH 33.1 26.0 - 34.0 pg   MCHC 32.1 30.0 - 36.0 g/dL   RDW 14.5 11.5 - 15.5 %   Platelets 79 (L) 150 - 400 K/uL    Comment: Immature Platelet Fraction may be clinically indicated, consider ordering this additional test [id number]    nRBC 0.0 0.0 - 0.2 %    Comment: Performed at [facility], [address]  [place], [place] [address]   Assessment/Plan: 1. Encounter for general adult medical examination with abnormal  findings - Updated PHM, face to face encounter for rehab, home aide, difficulty walking  2. Alcoholic cirrhosis of liver with ascites ([place]) - Continue to monitor, she is not drinking at the moment.  - spironolactone (ALDACTONE) 25 MG tablet; Take 1 tablet (25 mg total) by mouth daily. For cirrhosis of liver  Dispense: 90 tablet; Refill: 3  3. Fatigue, unspecified type - Pt does have low hemoglobin, might need to see hematology for erythropoietin inj   4. Atrial fibrillation, unspecified type ([place]) - Continue Cardizem as before  - apixaban (ELIQUIS) 5 MG TABS tablet; Take 5 mg by mouth 2 (two) times daily.  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 Depression, major, single episode, mild (HCC) - Start escitalopram (LEXAPRO) 10 MG tablet; Take 1 tablet (10 mg total) by mouth daily. For anxiety/sleep  Dispense: 90 tablet; Refill: 2  General Counseling: Adhvika verbalizes understanding of the findings of todays visit and agrees with plan of treatment. I have discussed any further diagnostic evaluation that may be needed or ordered today. We also reviewed her medications today. she has been encouraged to call the office with any questions or concerns that should arise related to todays visit.  Meds ordered this encounter  Medications  . spironolactone (ALDACTONE) 25 MG tablet    Sig: Take 1 tablet (25 mg total) by mouth daily. For cirrhosis of liver    Dispense:  90 tablet    Refill:  3  . escitalopram (LEXAPRO) 10 MG tablet    Sig: Take 1 tablet (10 mg total) by mouth daily. For anxiety/sleep    Dispense:  90 tablet    Refill:  2    Time spent: Dag[place], MD  Internal Medicine

## 2019-03-21 ENCOUNTER — Telehealth: Payer: Self-pay

## 2019-03-21 NOTE — Telephone Encounter (Signed)
Spoke with Corene Cornea from [facility] for Physical therapy referral he is going to come to office and pickup

## 2019-03-27 ENCOUNTER — Telehealth: Payer: Self-pay

## 2019-03-27 DIAGNOSIS — N189 Chronic kidney disease, unspecified: Secondary | ICD-10-CM | POA: Diagnosis not present

## 2019-03-27 DIAGNOSIS — R296 Repeated falls: Secondary | ICD-10-CM | POA: Diagnosis not present

## 2019-03-27 DIAGNOSIS — R269 Unspecified abnormalities of gait and mobility: Secondary | ICD-10-CM | POA: Diagnosis not present

## 2019-03-27 DIAGNOSIS — Z7901 Long term (current) use of anticoagulants: Secondary | ICD-10-CM | POA: Diagnosis not present

## 2019-03-27 DIAGNOSIS — R5383 Other fatigue: Secondary | ICD-10-CM | POA: Diagnosis not present

## 2019-03-27 DIAGNOSIS — E1122 Type 2 diabetes mellitus with diabetic chronic kidney disease: Secondary | ICD-10-CM | POA: Diagnosis not present

## 2019-03-27 DIAGNOSIS — Z9181 History of falling: Secondary | ICD-10-CM | POA: Diagnosis not present

## 2019-03-27 DIAGNOSIS — K219 Gastro-esophageal reflux disease without esophagitis: Secondary | ICD-10-CM | POA: Diagnosis not present

## 2019-03-27 DIAGNOSIS — K7460 Unspecified cirrhosis of liver: Secondary | ICD-10-CM | POA: Diagnosis not present

## 2019-03-27 DIAGNOSIS — E785 Hyperlipidemia, unspecified: Secondary | ICD-10-CM | POA: Diagnosis not present

## 2019-03-27 DIAGNOSIS — I129 Hypertensive chronic kidney disease with stage 1 through stage 4 chronic kidney disease, or unspecified chronic kidney disease: Secondary | ICD-10-CM | POA: Diagnosis not present

## 2019-03-27 DIAGNOSIS — E1140 Type 2 diabetes mellitus with diabetic neuropathy, unspecified: Secondary | ICD-10-CM | POA: Diagnosis not present

## 2019-03-27 NOTE — Telephone Encounter (Signed)
Verbal orders given for physical therapy for 2x for 5 weeks

## 2019-03-28 ENCOUNTER — Ambulatory Visit (INDEPENDENT_AMBULATORY_CARE_PROVIDER_SITE_OTHER): Payer: Medicare Other | Admitting: Gastroenterology

## 2019-03-28 ENCOUNTER — Other Ambulatory Visit: Payer: Self-pay

## 2019-03-28 ENCOUNTER — Encounter: Payer: Self-pay | Admitting: Gastroenterology

## 2019-03-28 ENCOUNTER — Other Ambulatory Visit
Admission: RE | Admit: 2019-03-28 | Discharge: 2019-03-28 | Disposition: A | Discharge status: Home / Self Care | Payer: Medicare Other | Attending: Gastroenterology | Admitting: Gastroenterology

## 2019-03-28 VITALS — BP 100/64 | HR 106 | Temp 97.7°F | Ht 66.0 in | Wt 142.0 lb

## 2019-03-28 DIAGNOSIS — K254 Chronic or unspecified gastric ulcer with hemorrhage: Secondary | ICD-10-CM | POA: Insufficient documentation

## 2019-03-28 DIAGNOSIS — K7030 Alcoholic cirrhosis of liver without ascites: Secondary | ICD-10-CM | POA: Insufficient documentation

## 2019-03-28 DIAGNOSIS — R768 Other specified abnormal immunological findings in serum: Secondary | ICD-10-CM | POA: Insufficient documentation

## 2019-03-28 LAB — COMPREHENSIVE METABOLIC PANEL
ALT: 16 U/L (ref 0–44)
AST: 42 U/L — ABNORMAL HIGH (ref 15–41)
Albumin: 2.8 g/dL — ABNORMAL LOW (ref 3.5–5.0)
Alkaline Phosphatase: 56 U/L (ref 38–126)
Anion gap: 13 (ref 5–15)
BUN: 9 mg/dL (ref 8–23)
CO2: 32 mmol/L (ref 22–32)
Calcium: 8.8 mg/dL — ABNORMAL LOW (ref 8.9–10.3)
Chloride: 93 mmol/L — ABNORMAL LOW (ref 98–111)
Creatinine, Ser: 1.31 mg/dL — ABNORMAL HIGH (ref 0.44–1.00)
GFR calc Af Amer: 49 mL/min — ABNORMAL LOW (ref >60)
GFR calc non Af Amer: 43 mL/min — ABNORMAL LOW (ref >60)
Glucose, Bld: 120 mg/dL — ABNORMAL HIGH (ref 70–99)
Potassium: 3.1 mmol/L — ABNORMAL LOW (ref 3.5–5.1)
Sodium: 138 mmol/L (ref 135–145)
Total Bilirubin: 2.4 mg/dL — ABNORMAL HIGH (ref 0.3–1.2)
Total Protein: 9.3 g/dL — ABNORMAL HIGH (ref 6.5–8.1)

## 2019-03-28 LAB — CBC
HCT: 34.2 % — ABNORMAL LOW (ref 36.0–46.0)
Hemoglobin: 11.3 g/dL — ABNORMAL LOW (ref 12.0–15.0)
MCH: 31.7 pg (ref 26.0–34.0)
MCHC: 33 g/dL (ref 30.0–36.0)
MCV: 95.8 fL (ref 80.0–100.0)
Platelets: 164 10*3/uL (ref 150–400)
RBC: 3.57 MIL/uL — ABNORMAL LOW (ref 3.87–5.11)
RDW: 13.6 % (ref 11.5–15.5)
WBC: 5.4 10*3/uL (ref 4.0–10.5)
nRBC: 0 % (ref 0.0–0.2)

## 2019-03-28 NOTE — Progress Notes (Signed)
Adriana Darby, MD [address]  [place], [place] [address]   Main: [phone]  Fax: [phone]    Gastroenterology Consultation  Referring Provider:     Ronnell Freshwater, NP Primary Care Physician:  Ronnell Freshwater, NP Primary Gastroenterologist:  Dr. Cephas Miller Reason for Consultation:  Anemia, Alcoholic cirrhosis        HPI:   Adriana Miller is a 63 y.o. female referred by Dr. Ronnell Freshwater, NP  for consultation & management of anemia, alcoholic cirrhosis Patient has history of alcoholic cirrhosis, history of cerebral aneurysm on Eliquis was recently admitted to [facility] on 02/08/2019 which presented with bloody diarrhea and anemia.  She underwent EGD and colonoscopy.  EGD revealed bleeding erosion in the stomach that was cauterized.  There was evidence of portal hypertensive gastropathy.  Colonoscopy revealed small polyps that were removed.  Since discharge, patient acknowledges that she did not drink alcohol. She lives alone.  She has poor appetite and has been eating once a day and sometimes does not eat all day.  She is losing weight.  She has a brother who visits her 2-3 times a week.  She reports that her PCP is working on arranging an aide for her She denies abdominal pain, nausea, vomiting, black stools, fever, chills, swelling of legs or abdominal distention, rectal bleeding, diarrhea  NSAIDs: None  Antiplts/Anticoagulants/Anti thrombotics: Eliquis for history of A. fib  GI Procedures:  EGD and colonoscopy 02/12/19 - Normal duodenal bulb and second portion of the duodenum. - Portal hypertensive gastropathy. - Bleeding erosive gastropathy. Treated with bipolar cautery. - Small hiatal hernia. - Normal gastroesophageal junction and esophagus.  - Preparation of the colon was poor. - Hemorrhoids found on perianal exam. - Three 3 to 5 mm polyps in the sigmoid colon, in the descending colon and in the ascending colon. - Non-bleeding  external and internal hemorrhoids. - Stool in the entire examined colon.  DIAGNOSIS:  A. COLON POLYP, ASCENDING; COLD SNARE:  - TUBULAR ADENOMA.  - NEGATIVE FOR HIGH-GRADE DYSPLASIA AND MALIGNANCY.   B. COLON POLYPS X2, DESCENDING; COLD SNARE:  - FRAGMENTS (X3) OF TUBULAR ADENOMAS.  - NEGATIVE FOR HIGH-GRADE DYSPLASIA AND MALIGNANCY.  Colonoscopy 06/25/2014 DIAGNOSIS:  A. COLON POLYP, SIGMOID; HOT SNARE:  - TUBULAR ADENOMA.  - NEGATIVE FOR HIGH-GRADE DYSPLASIA AND MALIGNANCY.   B. COLON POLYP, DESCENDING; COLD SNARE:  - TUBULAR ADENOMA.  - NEGATIVE FOR HIGH-GRADE DYSPLASIA AND MALIGNANCY.   C. COLON POLYP, CECUM; COLD BIOPSY:  - TUBULAR ADENOMA.  - NEGATIVE FOR HIGH-GRADE DYSPLASIA AND MALIGNANCY.   D. COLON POLYP, ASCENDING; HOT SNARE:  - TUBULAR ADENOMA, FRAGMENTS.  - NEGATIVE FOR HIGH-GRADE DYSPLASIA AND MALIGNANCY.   E. COLON POLYP, DESCENDING; COLD BIOPSY:  - TUBULAR ADENOMA.  - NEGATIVE FOR HIGH-GRADE DYSPLASIA AND MALIGNANCY.   Past Medical History:  Diagnosis Date  . Cerebral aneurysm   . Chronic kidney disease   . Diabetes mellitus without complication ([place])   . Hyperlipidemia   . Hypertension   . Seizure disorder [facility])     Past Surgical History:  Procedure Laterality Date  . CEREBRAL ANEURYSM REPAIR  1991  . COLONOSCOPY WITH PROPOFOL N/A 02/12/2019   Procedure: COLONOSCOPY WITH PROPOFOL;  Surgeon: Adriana Landsman, MD;  Location: [facility] ENDOSCOPY;  Service: Gastroenterology;  Laterality: N/A;  . ESOPHAGOGASTRODUODENOSCOPY (EGD) WITH PROPOFOL N/A 02/12/2019   Procedure: ESOPHAGOGASTRODUODENOSCOPY (EGD) WITH PROPOFOL;  Surgeon: Adriana Landsman, MD;  Location: [place];  Service:  Gastroenterology;  Laterality: N/A;    Current Outpatient Medications:  .  apixaban (ELIQUIS) 5 MG TABS tablet, Take 5 mg by mouth 2 (two) times daily., Disp: , Rfl:  .  escitalopram (LEXAPRO) 10 MG tablet, Take 1 tablet (10 mg total) by mouth daily. For anxiety/sleep,  Disp: 90 tablet, Rfl: 2 .  furosemide (LASIX) 20 MG tablet, TAKE 1 TABLET BY MOUTH EVERY DAY, Disp: 90 tablet, Rfl: 3 .  spironolactone (ALDACTONE) 25 MG tablet, Take 1 tablet (25 mg total) by mouth daily. For cirrhosis of liver, Disp: 90 tablet, Rfl: 3 .  diltiazem (CARDIZEM CD) 120 MG 24 hr capsule, Take 1 capsule (120 mg total) by mouth daily., Disp: 30 capsule, Rfl: 0 .  pantoprazole (PROTONIX) 40 MG tablet, Take 1 tablet (40 mg total) by mouth 2 (two) times daily. (Patient not taking: Reported on 03/28/2019), Disp: 60 tablet, Rfl: 0   Family History  Problem Relation Age of Onset  . Heart attack Brother 10     Social History   Tobacco Use  . Smoking status: Never Smoker  . Smokeless tobacco: Current User    Types: Snuff  Substance Use Topics  . Alcohol use: Not Currently    Alcohol/week: 14.0 standard drinks    Types: 14 Cans of beer per week    Comment: pt has cut back on alcohol use/stated not currently drinking  . Drug use: No    Allergies as of 03/28/2019 - Review Complete 03/28/2019  Allergen Reaction Noted  . Aspirin Other (See Comments) 05/19/2015    Review of Systems:    All systems reviewed and negative except where noted in HPI.   Physical Exam:  BP 100/64   Pulse (!) 106   Temp 97.7 F (36.5 C) (Oral)   Ht 5\' 6"  (1.676 m)   Wt 142 lb (64.4 kg)   BMI 22.92 kg/m  No LMP recorded. Patient is postmenopausal.  General:   Alert, poorly nourished, cachectic and cooperative in NAD Head:  Normocephalic and atraumatic, bitemporal wasting. Eyes:  Sclera clear, no icterus.   Conjunctiva pink. Ears:  Normal auditory acuity. Nose:  No deformity, discharge, or lesions. Mouth:  No deformity or lesions,oropharynx pink & moist. Neck:  Supple; no masses or thyromegaly. Lungs:  Respirations even and unlabored.  Clear throughout to auscultation.   No wheezes, crackles, or rhonchi. No acute distress. Heart:  Regular rate and rhythm; no murmurs, clicks, rubs, or gallops.  Abdomen:  Normal bowel sounds. Soft, non-tender and non-distended without masses, hepatosplenomegaly or hernias noted.  No guarding or rebound tenderness.   Rectal: Not performed Msk:  Symmetrical without gross deformities. Good, equal movement & strength bilaterally. Pulses:  Normal pulses noted. Extremities:  No clubbing or edema.  No cyanosis. Neurologic:  Alert and oriented x3;  grossly normal neurologically. Skin: Dry scaly skin, intact without significant lesions or rashes. No jaundice. Lymph Nodes:  No significant cervical adenopathy. Psych:  Alert and cooperative. Normal mood and affect.  Imaging Studies: Reviewed  Assessment and Plan:   NGA SERGI is a 63 y.o. female with history of diabetes, CKD, alcoholic cirrhosis, history of A. 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  Check HCVRNA, hepatitis B surface antibody, hepatitis B core antibody total, hepatitis C antibody total Remain abstinent from alcohol  Protein calorie malnutrition Reiterated to have 2-3 meals a day Provided her with boost supplements from our office  Anemia Recheck CBC today Normal B12 and iron studies recently   Follow up in 4 to 6 weeks   Adriana Darby, MD

## 2019-03-29 LAB — HEPATITIS B CORE ANTIBODY, TOTAL: Hep B Core Total Ab: REACTIVE — AB

## 2019-03-29 LAB — HCV RNA QUANT RFLX ULTRA OR GENOTYP
HCV RNA Qnt(log copy/mL): UNDETERMINED log10 IU/mL
HepC Qn: NOT DETECTED IU/mL

## 2019-03-29 LAB — HEPATITIS B SURFACE ANTIBODY,QUALITATIVE: Hep B S Ab: REACTIVE — AB

## 2019-03-29 LAB — AFP TUMOR MARKER: AFP, Serum, Tumor Marker: 559 ng/mL — ABNORMAL HIGH (ref 0.0–8.3)

## 2019-03-29 LAB — HEPATITIS A ANTIBODY, TOTAL: hep A Total Ab: REACTIVE — AB

## 2019-03-29 NOTE — Progress Notes (Signed)
Low serum potassium, recommend oral potassium 62meq daily for 3 days, recheck BMP next week She will need CT Abdomen liver mass protocol after rechecking BMP as her creatinine is elevated

## 2019-03-30 DIAGNOSIS — E1140 Type 2 diabetes mellitus with diabetic neuropathy, unspecified: Secondary | ICD-10-CM | POA: Diagnosis not present

## 2019-03-30 DIAGNOSIS — R269 Unspecified abnormalities of gait and mobility: Secondary | ICD-10-CM | POA: Diagnosis not present

## 2019-03-30 DIAGNOSIS — I129 Hypertensive chronic kidney disease with stage 1 through stage 4 chronic kidney disease, or unspecified chronic kidney disease: Secondary | ICD-10-CM | POA: Diagnosis not present

## 2019-03-30 DIAGNOSIS — R5383 Other fatigue: Secondary | ICD-10-CM | POA: Diagnosis not present

## 2019-03-30 DIAGNOSIS — E1122 Type 2 diabetes mellitus with diabetic chronic kidney disease: Secondary | ICD-10-CM | POA: Diagnosis not present

## 2019-03-30 DIAGNOSIS — Z7901 Long term (current) use of anticoagulants: Secondary | ICD-10-CM | POA: Diagnosis not present

## 2019-03-30 DIAGNOSIS — R296 Repeated falls: Secondary | ICD-10-CM | POA: Diagnosis not present

## 2019-03-30 DIAGNOSIS — N189 Chronic kidney disease, unspecified: Secondary | ICD-10-CM | POA: Diagnosis not present

## 2019-03-30 DIAGNOSIS — Z9181 History of falling: Secondary | ICD-10-CM | POA: Diagnosis not present

## 2019-03-30 DIAGNOSIS — K219 Gastro-esophageal reflux disease without esophagitis: Secondary | ICD-10-CM | POA: Diagnosis not present

## 2019-03-30 DIAGNOSIS — E785 Hyperlipidemia, unspecified: Secondary | ICD-10-CM | POA: Diagnosis not present

## 2019-03-30 DIAGNOSIS — K7460 Unspecified cirrhosis of liver: Secondary | ICD-10-CM | POA: Diagnosis not present

## 2019-04-03 DIAGNOSIS — R269 Unspecified abnormalities of gait and mobility: Secondary | ICD-10-CM | POA: Diagnosis not present

## 2019-04-03 DIAGNOSIS — K7460 Unspecified cirrhosis of liver: Secondary | ICD-10-CM | POA: Diagnosis not present

## 2019-04-03 DIAGNOSIS — I129 Hypertensive chronic kidney disease with stage 1 through stage 4 chronic kidney disease, or unspecified chronic kidney disease: Secondary | ICD-10-CM | POA: Diagnosis not present

## 2019-04-03 DIAGNOSIS — E785 Hyperlipidemia, unspecified: Secondary | ICD-10-CM | POA: Diagnosis not present

## 2019-04-03 DIAGNOSIS — E1122 Type 2 diabetes mellitus with diabetic chronic kidney disease: Secondary | ICD-10-CM | POA: Diagnosis not present

## 2019-04-03 DIAGNOSIS — K219 Gastro-esophageal reflux disease without esophagitis: Secondary | ICD-10-CM | POA: Diagnosis not present

## 2019-04-03 DIAGNOSIS — E1140 Type 2 diabetes mellitus with diabetic neuropathy, unspecified: Secondary | ICD-10-CM | POA: Diagnosis not present

## 2019-04-03 DIAGNOSIS — Z9181 History of falling: Secondary | ICD-10-CM | POA: Diagnosis not present

## 2019-04-03 DIAGNOSIS — N189 Chronic kidney disease, unspecified: Secondary | ICD-10-CM | POA: Diagnosis not present

## 2019-04-03 DIAGNOSIS — Z7901 Long term (current) use of anticoagulants: Secondary | ICD-10-CM | POA: Diagnosis not present

## 2019-04-03 DIAGNOSIS — R5383 Other fatigue: Secondary | ICD-10-CM | POA: Diagnosis not present

## 2019-04-03 DIAGNOSIS — R296 Repeated falls: Secondary | ICD-10-CM | POA: Diagnosis not present

## 2019-04-05 ENCOUNTER — Telehealth: Payer: Self-pay

## 2019-04-05 ENCOUNTER — Other Ambulatory Visit: Payer: Self-pay

## 2019-04-05 DIAGNOSIS — K7030 Alcoholic cirrhosis of liver without ascites: Secondary | ICD-10-CM

## 2019-04-05 MED ORDER — POTASSIUM CHLORIDE CRYS ER 20 MEQ PO TBCR: 40.0000 meq | EXTENDED_RELEASE_TABLET | Freq: Every day | ORAL | 0 refills | Status: DC

## 2019-04-05 NOTE — Telephone Encounter (Signed)
Adriana Miller, pt saw Dr. Marius Ditch last week: see below.   Pt notified of results. Labs ordered and potassium has been sent to [facility].   Pt aware she will need to schedule a CT scan liver when labs are back.

## 2019-04-05 NOTE — Telephone Encounter (Signed)
-----   Message from Lin Landsman, MD sent at 03/29/2019  8:00 PM EST ----- Please check hep B DNA. She has elevated AFP, recheck serum AFP in 2weeks

## 2019-04-05 NOTE — Telephone Encounter (Signed)
-----   Message from Lin Landsman, MD sent at 03/29/2019  8:02 PM EST ----- Low serum potassium, recommend oral potassium 70meq daily for 3 days, recheck BMP next week She will need CT Abdomen liver mass protocol after rechecking BMP as her creatinine is elevated

## 2019-04-06 ENCOUNTER — Telehealth: Payer: Self-pay | Admitting: Gastroenterology

## 2019-04-06 DIAGNOSIS — K7460 Unspecified cirrhosis of liver: Secondary | ICD-10-CM | POA: Diagnosis not present

## 2019-04-06 DIAGNOSIS — R269 Unspecified abnormalities of gait and mobility: Secondary | ICD-10-CM | POA: Diagnosis not present

## 2019-04-06 DIAGNOSIS — E785 Hyperlipidemia, unspecified: Secondary | ICD-10-CM | POA: Diagnosis not present

## 2019-04-06 DIAGNOSIS — R296 Repeated falls: Secondary | ICD-10-CM | POA: Diagnosis not present

## 2019-04-06 DIAGNOSIS — I129 Hypertensive chronic kidney disease with stage 1 through stage 4 chronic kidney disease, or unspecified chronic kidney disease: Secondary | ICD-10-CM | POA: Diagnosis not present

## 2019-04-06 DIAGNOSIS — R5383 Other fatigue: Secondary | ICD-10-CM | POA: Diagnosis not present

## 2019-04-06 DIAGNOSIS — Z7901 Long term (current) use of anticoagulants: Secondary | ICD-10-CM | POA: Diagnosis not present

## 2019-04-06 DIAGNOSIS — N189 Chronic kidney disease, unspecified: Secondary | ICD-10-CM | POA: Diagnosis not present

## 2019-04-06 DIAGNOSIS — Z9181 History of falling: Secondary | ICD-10-CM | POA: Diagnosis not present

## 2019-04-06 DIAGNOSIS — E1140 Type 2 diabetes mellitus with diabetic neuropathy, unspecified: Secondary | ICD-10-CM | POA: Diagnosis not present

## 2019-04-06 DIAGNOSIS — K219 Gastro-esophageal reflux disease without esophagitis: Secondary | ICD-10-CM | POA: Diagnosis not present

## 2019-04-06 DIAGNOSIS — E1122 Type 2 diabetes mellitus with diabetic chronic kidney disease: Secondary | ICD-10-CM | POA: Diagnosis not present

## 2019-04-06 NOTE — Telephone Encounter (Signed)
Adriana Miller a physical therapist assistance from [facility]  Left vm regarding pt she received a call yesterday and was trying to get of the phone to rest and sleep please call him at [phone]

## 2019-04-09 NOTE — Telephone Encounter (Signed)
LVM for lab results and new prescription sent to pharmacy.

## 2019-04-10 ENCOUNTER — Other Ambulatory Visit: Payer: Self-pay

## 2019-04-10 DIAGNOSIS — N189 Chronic kidney disease, unspecified: Secondary | ICD-10-CM | POA: Diagnosis not present

## 2019-04-10 DIAGNOSIS — R5383 Other fatigue: Secondary | ICD-10-CM | POA: Diagnosis not present

## 2019-04-10 DIAGNOSIS — R296 Repeated falls: Secondary | ICD-10-CM | POA: Diagnosis not present

## 2019-04-10 DIAGNOSIS — K7030 Alcoholic cirrhosis of liver without ascites: Secondary | ICD-10-CM

## 2019-04-10 DIAGNOSIS — E1140 Type 2 diabetes mellitus with diabetic neuropathy, unspecified: Secondary | ICD-10-CM | POA: Diagnosis not present

## 2019-04-10 DIAGNOSIS — K219 Gastro-esophageal reflux disease without esophagitis: Secondary | ICD-10-CM | POA: Diagnosis not present

## 2019-04-10 DIAGNOSIS — Z9181 History of falling: Secondary | ICD-10-CM | POA: Diagnosis not present

## 2019-04-10 DIAGNOSIS — I129 Hypertensive chronic kidney disease with stage 1 through stage 4 chronic kidney disease, or unspecified chronic kidney disease: Secondary | ICD-10-CM | POA: Diagnosis not present

## 2019-04-10 DIAGNOSIS — E785 Hyperlipidemia, unspecified: Secondary | ICD-10-CM | POA: Diagnosis not present

## 2019-04-10 DIAGNOSIS — K7460 Unspecified cirrhosis of liver: Secondary | ICD-10-CM | POA: Diagnosis not present

## 2019-04-10 DIAGNOSIS — Z7901 Long term (current) use of anticoagulants: Secondary | ICD-10-CM | POA: Diagnosis not present

## 2019-04-10 DIAGNOSIS — E1122 Type 2 diabetes mellitus with diabetic chronic kidney disease: Secondary | ICD-10-CM | POA: Diagnosis not present

## 2019-04-10 DIAGNOSIS — K254 Chronic or unspecified gastric ulcer with hemorrhage: Secondary | ICD-10-CM

## 2019-04-10 DIAGNOSIS — R269 Unspecified abnormalities of gait and mobility: Secondary | ICD-10-CM | POA: Diagnosis not present

## 2019-04-16 ENCOUNTER — Other Ambulatory Visit
Admission: RE | Admit: 2019-04-16 | Discharge: 2019-04-16 | Disposition: A | Discharge status: Home / Self Care | Payer: Medicare Other | Attending: Gastroenterology | Admitting: Gastroenterology

## 2019-04-16 ENCOUNTER — Other Ambulatory Visit: Payer: Self-pay

## 2019-04-16 DIAGNOSIS — E876 Hypokalemia: Secondary | ICD-10-CM | POA: Diagnosis not present

## 2019-04-16 DIAGNOSIS — K7030 Alcoholic cirrhosis of liver without ascites: Secondary | ICD-10-CM | POA: Diagnosis not present

## 2019-04-16 LAB — BASIC METABOLIC PANEL
Anion gap: 11 (ref 5–15)
BUN: 15 mg/dL (ref 8–23)
CO2: 23 mmol/L (ref 22–32)
Calcium: 8.9 mg/dL (ref 8.9–10.3)
Chloride: 101 mmol/L (ref 98–111)
Creatinine, Ser: 1.46 mg/dL — ABNORMAL HIGH (ref 0.44–1.00)
GFR calc Af Amer: 43 mL/min — ABNORMAL LOW (ref >60)
GFR calc non Af Amer: 37 mL/min — ABNORMAL LOW (ref >60)
Glucose, Bld: 168 mg/dL — ABNORMAL HIGH (ref 70–99)
Potassium: 3.9 mmol/L (ref 3.5–5.1)
Sodium: 135 mmol/L (ref 135–145)

## 2019-04-17 ENCOUNTER — Other Ambulatory Visit: Payer: Self-pay

## 2019-04-17 DIAGNOSIS — R1084 Generalized abdominal pain: Secondary | ICD-10-CM

## 2019-04-17 DIAGNOSIS — K7030 Alcoholic cirrhosis of liver without ascites: Secondary | ICD-10-CM

## 2019-04-17 DIAGNOSIS — K760 Fatty (change of) liver, not elsewhere classified: Secondary | ICD-10-CM

## 2019-04-17 DIAGNOSIS — E876 Hypokalemia: Secondary | ICD-10-CM

## 2019-04-17 NOTE — Progress Notes (Signed)
Please call patient to recheck her BMP and AFP, order CT Abdomen liver mass protocol ASAP

## 2019-04-18 NOTE — Progress Notes (Signed)
Discuss results at visit 05/01/2019

## 2019-04-19 ENCOUNTER — Encounter: Payer: Self-pay | Admitting: Internal Medicine

## 2019-04-19 ENCOUNTER — Ambulatory Visit (INDEPENDENT_AMBULATORY_CARE_PROVIDER_SITE_OTHER): Payer: Medicare Other | Admitting: Internal Medicine

## 2019-04-19 ENCOUNTER — Other Ambulatory Visit: Payer: Self-pay

## 2019-04-19 ENCOUNTER — Telehealth: Payer: Self-pay | Admitting: Internal Medicine

## 2019-04-19 VITALS — BP 110/68 | HR 68 | Ht 66.0 in | Wt 147.0 lb

## 2019-04-19 DIAGNOSIS — I129 Hypertensive chronic kidney disease with stage 1 through stage 4 chronic kidney disease, or unspecified chronic kidney disease: Secondary | ICD-10-CM | POA: Diagnosis not present

## 2019-04-19 DIAGNOSIS — Z7901 Long term (current) use of anticoagulants: Secondary | ICD-10-CM | POA: Diagnosis not present

## 2019-04-19 DIAGNOSIS — R269 Unspecified abnormalities of gait and mobility: Secondary | ICD-10-CM | POA: Diagnosis not present

## 2019-04-19 DIAGNOSIS — K7030 Alcoholic cirrhosis of liver without ascites: Secondary | ICD-10-CM | POA: Diagnosis not present

## 2019-04-19 DIAGNOSIS — N189 Chronic kidney disease, unspecified: Secondary | ICD-10-CM | POA: Diagnosis not present

## 2019-04-19 DIAGNOSIS — I959 Hypotension, unspecified: Secondary | ICD-10-CM | POA: Diagnosis not present

## 2019-04-19 DIAGNOSIS — R5383 Other fatigue: Secondary | ICD-10-CM | POA: Diagnosis not present

## 2019-04-19 DIAGNOSIS — E785 Hyperlipidemia, unspecified: Secondary | ICD-10-CM | POA: Diagnosis not present

## 2019-04-19 DIAGNOSIS — I5032 Chronic diastolic (congestive) heart failure: Secondary | ICD-10-CM | POA: Diagnosis not present

## 2019-04-19 DIAGNOSIS — I4819 Other persistent atrial fibrillation: Secondary | ICD-10-CM | POA: Diagnosis not present

## 2019-04-19 DIAGNOSIS — K219 Gastro-esophageal reflux disease without esophagitis: Secondary | ICD-10-CM | POA: Diagnosis not present

## 2019-04-19 DIAGNOSIS — E1140 Type 2 diabetes mellitus with diabetic neuropathy, unspecified: Secondary | ICD-10-CM | POA: Diagnosis not present

## 2019-04-19 DIAGNOSIS — K7460 Unspecified cirrhosis of liver: Secondary | ICD-10-CM | POA: Diagnosis not present

## 2019-04-19 DIAGNOSIS — R296 Repeated falls: Secondary | ICD-10-CM | POA: Diagnosis not present

## 2019-04-19 DIAGNOSIS — E1122 Type 2 diabetes mellitus with diabetic chronic kidney disease: Secondary | ICD-10-CM | POA: Diagnosis not present

## 2019-04-19 DIAGNOSIS — Z9181 History of falling: Secondary | ICD-10-CM | POA: Diagnosis not present

## 2019-04-19 NOTE — Progress Notes (Signed)
Follow-up Outpatient Visit Date: 04/19/2019  Primary Care Provider: Ronnell Freshwater, NP [address] [place] [address]  Chief Complaint: Follow-up atrial fibrillation  HPI:  Adriana Miller is a 63 y.o. female with history of persistent atrial fibrillation, hypertension, hyperlipidemia, diabetes mellitus, chronic kidney disease, cirrhosis complicated by GI bleed in 01/2019, seizure disorder, and cerebral aneurysm status post repair, who presents for follow-up of atrial fibrillation.  I last saw her in early November, at which time she was feeling well.  Of note, earlier in the fall, she was hospitalized with cellulitis and GI bleed.  She was diagnosed with cirrhosis and erosive gastropathy, prompting discontinuation of apixaban.  While she had spontaneously converted to sinus rhythm at some point after her first visit in September, she was back in atrial fibrillation in November, albeit without symptoms.  We decided to continue diltiazem 120 mg daily without anticoagulation due to recent GI bleed.  Enalapril was discontinued in the setting of hypotension (albeit asymptomatic).  Today, Adriana Miller reports that she is feeling very well.  Her edema has resolved.  She also denies chest pain, shortness of breath, palpitations, lightheadedness, and orthopnea.  She is tolerating her current medications, though it is unclear if she is taking apixaban or not.  It had previously been stopped in the setting of GI bleed in October.  However, it reappeared on her medication list when she was seen by Dr. Humphrey Rolls earlier this month.  She is unsure if it is one of the medications that she takes nor who would have advised her to start taking it again.  --------------------------------------------------------------------------------------------------  Cardiovascular History & Procedures: Cardiovascular Problems:  Atrial fibrillation  Risk Factors:  Hypertension, hyperlipidemia, diabetes mellitus, tobacco  use, and age greater than 61  Cath/PCI:  None  CV Surgery:  None  EP Procedures and Devices:  None  Non-Invasive Evaluation(s):  TTE (01/15/2019): Normal LV size and wall thickness. LVEF 55-60%. Normal RV size and function. No significant valvular abnormality.  Recent CV Pertinent Labs: Lab Results  Component Value Date   CHOL 134 01/27/2018   HDL 80 01/27/2018   LDLCALC 42 01/27/2018   TRIG 60 01/27/2018   INR 2.6 (H) 02/08/2019   K 3.9 04/16/2019   K 3.9 10/11/2013   MG 1.9 02/13/2019   BUN 15 04/16/2019   BUN 13 01/27/2018   BUN 13 10/11/2013   CREATININE 1.46 (H) 04/16/2019   CREATININE 0.87 10/11/2013    Past medical and surgical history were reviewed and updated in EPIC.  No outpatient medications have been marked as taking for the 04/19/19 encounter (Office Visit) with Adriana Miller, Adriana Gave, Adriana Miller.    Allergies: Aspirin  Social History   Tobacco Use  . Smoking status: Never Smoker  . Smokeless tobacco: Current User    Types: Snuff  Substance Use Topics  . Alcohol use: Not Currently    Alcohol/week: 14.0 standard drinks    Types: 14 Cans of beer per week    Comment: pt has cut back on alcohol use/stated not currently drinking  . Drug use: No    Family History  Problem Relation Age of Onset  . Heart attack Brother 24    Review of Systems: A 12-system review of systems was performed and was negative except as noted in the HPI.  --------------------------------------------------------------------------------------------------  Physical Exam: BP 110/68 (BP Location: Left Arm, Patient Position: Sitting, Cuff Size: Normal)   Pulse 68   Ht 5\' 6"  (1.676 m)   Wt 147 lb (66.7  kg)   BMI 23.73 kg/m   General:  NAD. HEENT: No conjunctival pallor or scleral icterus. Facemask in place. Neck: Supple without lymphadenopathy, thyromegaly, JVD, or HJR. Lungs: Normal work of breathing. Clear to auscultation bilaterally without wheezes or crackles. Heart:  Regular rate and rhythm without murmurs, rubs, or gallops. Non-displaced PMI. Abd: Bowel sounds present. Soft, NT/ND without hepatosplenomegaly Ext: No lower extremity edema. Skin: Warm and dry without rash.  EKG:  NSR with low voltage and lateral Q waves.  Compared with prior tracing from 02/20/2019, lateral Q waves are more pronounced.  NSR has also replaced atrial fibrillation.  Lab Results  Component Value Date   WBC 5.4 03/28/2019   HGB 11.3 (L) 03/28/2019   HCT 34.2 (L) 03/28/2019   MCV 95.8 03/28/2019   PLT 164 03/28/2019    Lab Results  Component Value Date   NA 135 04/16/2019   K 3.9 04/16/2019   CL 101 04/16/2019   CO2 23 04/16/2019   BUN 15 04/16/2019   CREATININE 1.46 (H) 04/16/2019   GLUCOSE 168 (H) 04/16/2019   ALT 16 03/28/2019    Lab Results  Component Value Date   CHOL 134 01/27/2018   HDL 80 01/27/2018   LDLCALC 42 01/27/2018   TRIG 60 01/27/2018    --------------------------------------------------------------------------------------------------  ASSESSMENT AND PLAN: Persistent atrial fibrillation: Adriana Miller is in NSR rhythm today and feels well.  It is unclear if/when she was restarted on apixaban; I have asked her to call us when she returns home to see if she is taking it.  I will reach out to Dr. Marius Ditch regarding the safety of continued anticoagulation in the setting of GI bleed in October.  Continue current dose of diltiazem.  Chronic HFpEF: Adriana Miller appears euvolemic with NYHA class I-II symptoms.  Recent labs through Adriana Miller were notable for mild bump in creatinine.  I have encouraged Adriana Miller to stay well-hydrated.  She should continue current doses of furosemide and spironolactone for now; further medication changes per her PCP.  Hypotension: BP improved today following discontinuation of enalapril at our last visit.  No medication changes today.  Cirrhosis: Continue f/u with GI and PCP.  Follow-up: Return to clinic in 6  months.  Nelva Bush, Adriana Miller 04/20/2019 10:21 AM

## 2019-04-19 NOTE — Telephone Encounter (Signed)
Patient calling in to state that Dr. Saunders Revel prescribed eliquis to patient. States this was dicussed in earlier appointment. Patient would like to be told whether or not to continue eliquis. Patient has been taking 5 mg twice a day  Per therapist the bottle says " Prescriber C End"

## 2019-04-19 NOTE — Patient Instructions (Signed)
Other Instructions Please increase your intake of fluid (water).  Please look at your medication bottles when you get home to see if you are taking Eliquis. Please call our office to let us know if you are or not. [phone].    Medication Instructions:  Your physician recommends that you continue on your current medications as directed. Please refer to the Current Medication list given to you today.  *If you need a refill on your cardiac medications before your next appointment, please call your pharmacy*  Lab Work: none If you have labs (blood work) drawn today and your tests are completely normal, you will receive your results only by: Marland Kitchen MyChart Message (if you have MyChart) OR . A paper copy in the mail If you have any lab test that is abnormal or we need to change your treatment, we will call you to review the results.  Testing/Procedures: none  Follow-Up: At [facility], you and your health needs are our priority.  As part of our continuing mission to provide you with exceptional heart care, we have created designated Provider Care Teams.  These Care Teams include your primary Cardiologist (physician) and Advanced Practice Providers (APPs -  Physician Assistants and Nurse Practitioners) who all work together to provide you with the care you need, when you need it.  Your next appointment:   6 month(s)  The format for your next appointment:   In Person  Provider:    You may see DR Harrell Gave END or one of the following Advanced Practice Providers on your designated Care Team:    Murray Hodgkins, NP  Christell Faith, PA-C  Marrianne Mood, PA-C

## 2019-04-20 DIAGNOSIS — I959 Hypotension, unspecified: Secondary | ICD-10-CM | POA: Insufficient documentation

## 2019-04-21 NOTE — Telephone Encounter (Signed)
I have touched base with Dr. Marius Ditch and she thinks it is reasonable for Adriana Miller to continue taking abixaban 5 mg BID, as her cirrhosis is well-compensated with evidence of varices.  Nelva Bush, MD [facility]

## 2019-04-24 NOTE — Telephone Encounter (Signed)
Spoke with patient and she verbalized understanding that it is ok to continue taking Eliquis 5 mg two times a day.

## 2019-04-24 NOTE — Telephone Encounter (Signed)
Patient returning call.

## 2019-04-24 NOTE — Telephone Encounter (Signed)
No answer. Left message to call back.   

## 2019-04-25 ENCOUNTER — Telehealth: Payer: Self-pay

## 2019-04-25 ENCOUNTER — Ambulatory Visit: Payer: Medicare Other

## 2019-04-25 DIAGNOSIS — Z7901 Long term (current) use of anticoagulants: Secondary | ICD-10-CM | POA: Diagnosis not present

## 2019-04-25 DIAGNOSIS — E1140 Type 2 diabetes mellitus with diabetic neuropathy, unspecified: Secondary | ICD-10-CM | POA: Diagnosis not present

## 2019-04-25 DIAGNOSIS — Z9181 History of falling: Secondary | ICD-10-CM | POA: Diagnosis not present

## 2019-04-25 DIAGNOSIS — E785 Hyperlipidemia, unspecified: Secondary | ICD-10-CM | POA: Diagnosis not present

## 2019-04-25 DIAGNOSIS — R5383 Other fatigue: Secondary | ICD-10-CM | POA: Diagnosis not present

## 2019-04-25 DIAGNOSIS — R269 Unspecified abnormalities of gait and mobility: Secondary | ICD-10-CM | POA: Diagnosis not present

## 2019-04-25 DIAGNOSIS — R296 Repeated falls: Secondary | ICD-10-CM | POA: Diagnosis not present

## 2019-04-25 DIAGNOSIS — K7460 Unspecified cirrhosis of liver: Secondary | ICD-10-CM | POA: Diagnosis not present

## 2019-04-25 DIAGNOSIS — K219 Gastro-esophageal reflux disease without esophagitis: Secondary | ICD-10-CM | POA: Diagnosis not present

## 2019-04-25 DIAGNOSIS — N189 Chronic kidney disease, unspecified: Secondary | ICD-10-CM | POA: Diagnosis not present

## 2019-04-25 DIAGNOSIS — E1122 Type 2 diabetes mellitus with diabetic chronic kidney disease: Secondary | ICD-10-CM | POA: Diagnosis not present

## 2019-04-25 DIAGNOSIS — I129 Hypertensive chronic kidney disease with stage 1 through stage 4 chronic kidney disease, or unspecified chronic kidney disease: Secondary | ICD-10-CM | POA: Diagnosis not present

## 2019-04-25 NOTE — Telephone Encounter (Signed)
Gave verbal  [facility] to [facility] Physical therapy [id number] 2 times as week for 4 weeks

## 2019-04-26 ENCOUNTER — Ambulatory Visit: Admission: RE | Admit: 2019-04-26 | Payer: Medicare Other | Source: Ambulatory Visit

## 2019-04-26 ENCOUNTER — Telehealth: Payer: Self-pay

## 2019-04-26 DIAGNOSIS — K219 Gastro-esophageal reflux disease without esophagitis: Secondary | ICD-10-CM | POA: Diagnosis not present

## 2019-04-26 DIAGNOSIS — N189 Chronic kidney disease, unspecified: Secondary | ICD-10-CM | POA: Diagnosis not present

## 2019-04-26 DIAGNOSIS — I129 Hypertensive chronic kidney disease with stage 1 through stage 4 chronic kidney disease, or unspecified chronic kidney disease: Secondary | ICD-10-CM | POA: Diagnosis not present

## 2019-04-26 DIAGNOSIS — R269 Unspecified abnormalities of gait and mobility: Secondary | ICD-10-CM | POA: Diagnosis not present

## 2019-04-26 DIAGNOSIS — E1122 Type 2 diabetes mellitus with diabetic chronic kidney disease: Secondary | ICD-10-CM | POA: Diagnosis not present

## 2019-04-26 DIAGNOSIS — E1140 Type 2 diabetes mellitus with diabetic neuropathy, unspecified: Secondary | ICD-10-CM | POA: Diagnosis not present

## 2019-04-26 DIAGNOSIS — R296 Repeated falls: Secondary | ICD-10-CM | POA: Diagnosis not present

## 2019-04-26 DIAGNOSIS — K7460 Unspecified cirrhosis of liver: Secondary | ICD-10-CM | POA: Diagnosis not present

## 2019-04-26 DIAGNOSIS — Z9181 History of falling: Secondary | ICD-10-CM | POA: Diagnosis not present

## 2019-04-26 DIAGNOSIS — R5383 Other fatigue: Secondary | ICD-10-CM | POA: Diagnosis not present

## 2019-04-26 DIAGNOSIS — Z7901 Long term (current) use of anticoagulants: Secondary | ICD-10-CM | POA: Diagnosis not present

## 2019-04-26 DIAGNOSIS — E785 Hyperlipidemia, unspecified: Secondary | ICD-10-CM | POA: Diagnosis not present

## 2019-04-26 NOTE — Telephone Encounter (Signed)
CONFIRMED AND SCREENED FOR 05-01-19 OV. °

## 2019-04-28 ENCOUNTER — Other Ambulatory Visit: Payer: Self-pay | Admitting: Internal Medicine

## 2019-04-30 DIAGNOSIS — Z9181 History of falling: Secondary | ICD-10-CM | POA: Diagnosis not present

## 2019-04-30 DIAGNOSIS — I129 Hypertensive chronic kidney disease with stage 1 through stage 4 chronic kidney disease, or unspecified chronic kidney disease: Secondary | ICD-10-CM | POA: Diagnosis not present

## 2019-04-30 DIAGNOSIS — K219 Gastro-esophageal reflux disease without esophagitis: Secondary | ICD-10-CM | POA: Diagnosis not present

## 2019-04-30 DIAGNOSIS — N189 Chronic kidney disease, unspecified: Secondary | ICD-10-CM | POA: Diagnosis not present

## 2019-04-30 DIAGNOSIS — E1122 Type 2 diabetes mellitus with diabetic chronic kidney disease: Secondary | ICD-10-CM | POA: Diagnosis not present

## 2019-04-30 DIAGNOSIS — E785 Hyperlipidemia, unspecified: Secondary | ICD-10-CM | POA: Diagnosis not present

## 2019-04-30 DIAGNOSIS — R5383 Other fatigue: Secondary | ICD-10-CM | POA: Diagnosis not present

## 2019-04-30 DIAGNOSIS — R296 Repeated falls: Secondary | ICD-10-CM | POA: Diagnosis not present

## 2019-04-30 DIAGNOSIS — E1140 Type 2 diabetes mellitus with diabetic neuropathy, unspecified: Secondary | ICD-10-CM | POA: Diagnosis not present

## 2019-04-30 DIAGNOSIS — Z7901 Long term (current) use of anticoagulants: Secondary | ICD-10-CM | POA: Diagnosis not present

## 2019-04-30 DIAGNOSIS — R269 Unspecified abnormalities of gait and mobility: Secondary | ICD-10-CM | POA: Diagnosis not present

## 2019-04-30 DIAGNOSIS — K7460 Unspecified cirrhosis of liver: Secondary | ICD-10-CM | POA: Diagnosis not present

## 2019-04-30 NOTE — Telephone Encounter (Signed)
Patient states she had a missed call from our office, unsure if it is related to this. If so please advise

## 2019-04-30 NOTE — Telephone Encounter (Signed)
Call to patient to discuss refill request for metoprolol. Adriana Miller, home health RN present and we reviewed current medication. She is not taking metoprolol.   Will refuse rx request.   No further questions or concerns at this time.   Advised pt to call for any further questions or concerns.

## 2019-04-30 NOTE — Telephone Encounter (Signed)
I spoke with pt and she verified she is taking Metoprolol Tartrate 25 mg tablet bid. Please advise if ok to refill medication not on last ov note or med list.

## 2019-05-01 ENCOUNTER — Ambulatory Visit: Payer: Medicare Other | Admitting: Internal Medicine

## 2019-05-02 DIAGNOSIS — R296 Repeated falls: Secondary | ICD-10-CM | POA: Diagnosis not present

## 2019-05-02 DIAGNOSIS — K219 Gastro-esophageal reflux disease without esophagitis: Secondary | ICD-10-CM | POA: Diagnosis not present

## 2019-05-02 DIAGNOSIS — E1140 Type 2 diabetes mellitus with diabetic neuropathy, unspecified: Secondary | ICD-10-CM | POA: Diagnosis not present

## 2019-05-02 DIAGNOSIS — Z7901 Long term (current) use of anticoagulants: Secondary | ICD-10-CM | POA: Diagnosis not present

## 2019-05-02 DIAGNOSIS — I129 Hypertensive chronic kidney disease with stage 1 through stage 4 chronic kidney disease, or unspecified chronic kidney disease: Secondary | ICD-10-CM | POA: Diagnosis not present

## 2019-05-02 DIAGNOSIS — R5383 Other fatigue: Secondary | ICD-10-CM | POA: Diagnosis not present

## 2019-05-02 DIAGNOSIS — E785 Hyperlipidemia, unspecified: Secondary | ICD-10-CM | POA: Diagnosis not present

## 2019-05-02 DIAGNOSIS — Z9181 History of falling: Secondary | ICD-10-CM | POA: Diagnosis not present

## 2019-05-02 DIAGNOSIS — R269 Unspecified abnormalities of gait and mobility: Secondary | ICD-10-CM | POA: Diagnosis not present

## 2019-05-02 DIAGNOSIS — N189 Chronic kidney disease, unspecified: Secondary | ICD-10-CM | POA: Diagnosis not present

## 2019-05-02 DIAGNOSIS — K7460 Unspecified cirrhosis of liver: Secondary | ICD-10-CM | POA: Diagnosis not present

## 2019-05-02 DIAGNOSIS — E1122 Type 2 diabetes mellitus with diabetic chronic kidney disease: Secondary | ICD-10-CM | POA: Diagnosis not present

## 2019-05-04 ENCOUNTER — Telehealth: Payer: Self-pay

## 2019-05-04 NOTE — Telephone Encounter (Signed)
Lmom for patient to confirm 05-08-19 ov as virtual. 

## 2019-05-08 ENCOUNTER — Ambulatory Visit (INDEPENDENT_AMBULATORY_CARE_PROVIDER_SITE_OTHER): Payer: Medicare Other | Admitting: Internal Medicine

## 2019-05-08 ENCOUNTER — Other Ambulatory Visit: Payer: Self-pay

## 2019-05-08 ENCOUNTER — Encounter: Payer: Self-pay | Admitting: Internal Medicine

## 2019-05-08 DIAGNOSIS — Z7901 Long term (current) use of anticoagulants: Secondary | ICD-10-CM

## 2019-05-08 DIAGNOSIS — R29898 Other symptoms and signs involving the musculoskeletal system: Secondary | ICD-10-CM

## 2019-05-08 DIAGNOSIS — K7031 Alcoholic cirrhosis of liver with ascites: Secondary | ICD-10-CM | POA: Diagnosis not present

## 2019-05-08 DIAGNOSIS — I4891 Unspecified atrial fibrillation: Secondary | ICD-10-CM

## 2019-05-08 DIAGNOSIS — F321 Major depressive disorder, single episode, moderate: Secondary | ICD-10-CM

## 2019-05-08 MED ORDER — APIXABAN 2.5 MG PO TABS: 2.5000 mg | ORAL_TABLET | Freq: Two times a day (BID) | ORAL | 1 refills | Status: DC

## 2019-05-08 MED ORDER — MIRTAZAPINE 7.5 MG PO TABS: ORAL_TABLET | ORAL | 1 refills | Status: DC

## 2019-05-08 NOTE — Progress Notes (Signed)
[facility] [place], [place] [address]  Internal MEDICINE  Office Visit Note  Patient Name: Adriana Miller  [id number]  [id number]  Date of Service: 05/08/2019  Chief Complaint  Patient presents with  . Hypertension  . Atrial Fibrillation    HPI  Pt is here for routine follow up. She is feeling better, however she is still has difficulty walking and performing her ADL's. 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 She is c/o not sleeping well at night. Depression is better. Pt is not sure about Eliquis as it was stopped during her admission in the hospital for GI bleed. Pt also has cirrhosis of liver. Her metoprolol was stopped due to hypotension   Current Medication: Outpatient Encounter Medications as of 05/08/2019  Medication Sig  . escitalopram (LEXAPRO) 10 MG tablet Take 1 tablet (10 mg total) by mouth daily. For anxiety/sleep  . furosemide (LASIX) 20 MG tablet TAKE 1 TABLET BY MOUTH EVERY DAY  . potassium chloride SA (KLOR-CON) 20 MEQ tablet Take 2 tablets (40 mEq total) by mouth daily.  Marland Kitchen spironolactone (ALDACTONE) 25 MG tablet Take 1 tablet (25 mg total) by mouth daily. For cirrhosis of liver  . [DISCONTINUED] apixaban (ELIQUIS) 5 MG TABS tablet Take 5 mg by mouth 2 (two) times daily.  Marland Kitchen apixaban (ELIQUIS) 2.5 MG TABS tablet Take 1 tablet (2.5 mg total) by mouth 2 (two) times daily.  Marland Kitchen diltiazem (CARDIZEM CD) 120 MG 24 hr capsule Take 1 capsule (120 mg total) by mouth daily.  . [DISCONTINUED] pantoprazole (PROTONIX) 40 MG tablet Take 1 tablet (40 mg total) by mouth 2 (two) times daily. (Patient not taking: Reported on 05/08/2019)   No facility-administered encounter medications on file as of 05/08/2019.    Surgical History: Past Surgical History:  Procedure Laterality Date  . CEREBRAL ANEURYSM REPAIR  1991  . COLONOSCOPY WITH PROPOFOL N/A 02/12/2019   Procedure: COLONOSCOPY WITH PROPOFOL;  Surgeon: Lin Landsman,  MD;  Location: [facility] ENDOSCOPY;  Service: Gastroenterology;  Laterality: N/A;  . ESOPHAGOGASTRODUODENOSCOPY (EGD) WITH PROPOFOL N/A 02/12/2019   Procedure: ESOPHAGOGASTRODUODENOSCOPY (EGD) WITH PROPOFOL;  Surgeon: Lin Landsman, MD;  Location: [facility] ENDOSCOPY;  Service: Gastroenterology;  Laterality: N/A;    Medical History: Past Medical History:  Diagnosis Date  . Cerebral aneurysm   . Chronic kidney disease   . Diabetes mellitus without complication ([place])   . Hyperlipidemia   . Hypertension   . Seizure disorder [facility])     Family History: Family History  Problem Relation Age of Onset  . Heart attack Brother 63    Social History   Socioeconomic History  . Marital status: Married    Spouse name: Not on file  . Number of children: Not on file  . Years of education: Not on file  . Highest education level: Not on file  Occupational History  . Not on file  Tobacco Use  . Smoking status: Never Smoker  . Smokeless tobacco: Current User    Types: Snuff  Substance and Sexual Activity  . Alcohol use: Not Currently    Alcohol/week: 14.0 standard drinks    Types: 14 Cans of beer per week    Comment: pt has cut back on alcohol use/stated not currently drinking  . Drug use: No  . Sexual activity: Not Currently  Other Topics Concern  . Not on file  Social History Narrative  . Not on file   Social Determinants of Health  Financial Resource Strain:   . Difficulty of Paying Living Expenses: Not on file  Food Insecurity:   . Worried About Charity fundraiser in the Last Year: Not on file  . Ran Out of Food in the Last Year: Not on file  Transportation Needs:   . Lack of Transportation (Medical): Not on file  . Lack of Transportation (Non-Medical): Not on file  Physical Activity:   . Days of Exercise per Week: Not on file  . Minutes of Exercise per Session: Not on file  Stress:   . Feeling of Stress : Not on file  Social Connections:   . Frequency of Communication with  Friends and Family: Not on file  . Frequency of Social Gatherings with Friends and Family: Not on file  . Attends Religious Services: Not on file  . Active Member of Clubs or Organizations: Not on file  . Attends Archivist Meetings: Not on file  . Marital Status: Not on file  Intimate Partner Violence:   . Fear of Current or Ex-Partner: Not on file  . Emotionally Abused: Not on file  . Physically Abused: Not on file  . Sexually Abused: Not on file    Review of Systems  Constitutional: Negative for chills, fatigue and unexpected weight change.  HENT: Negative for congestion, postnasal drip, rhinorrhea, sneezing and sore throat.   Eyes: Negative for redness.  Respiratory: Negative for cough, chest tightness and shortness of breath.   Cardiovascular: Negative for chest pain and palpitations.  Gastrointestinal: Negative for abdominal pain, constipation, diarrhea, nausea and vomiting.  Genitourinary: Negative for dysuria and frequency.  Musculoskeletal: Positive for gait problem. Negative for arthralgias, back pain, joint swelling and neck pain.  Skin: Negative for rash.  Neurological: Positive for weakness. Negative for tremors and numbness.  Hematological: Negative for adenopathy. Does not bruise/bleed easily.  Psychiatric/Behavioral: Positive for sleep disturbance. Negative for behavioral problems (Depression) and suicidal ideas. The patient is nervous/anxious.    Vital Signs: BP 137/72   Pulse 84   Temp (!) 96.5 F (35.8 C)   Resp 16   Ht 5\' 6"  (1.676 m)   SpO2 100%   BMI 23.73 kg/m   Physical Exam Nursing note reviewed.  Constitutional:      General: She is not in acute distress.    Appearance: She is well-developed. She is not diaphoretic.  HENT:     Head: Normocephalic and atraumatic.     Mouth/Throat:     Pharynx: No oropharyngeal exudate.  Eyes:     Pupils: Pupils are equal, round, and reactive to light.  Neck:     Thyroid: No thyromegaly.      Vascular: No JVD.     Trachea: No tracheal deviation.  Cardiovascular:     Rate and Rhythm: Normal rate and regular rhythm.     Heart sounds: Normal heart sounds. No murmur. No friction rub. No gallop.   Pulmonary:     Effort: Pulmonary effort is normal. No respiratory distress.     Breath sounds: No wheezing or rales.  Chest:     Chest wall: No tenderness.  Abdominal:     Palpations: Abdomen is soft.  Lymphadenopathy:     Cervical: No cervical adenopathy.  Skin:    General: Skin is warm and dry.  Neurological:     Mental Status: She is alert and oriented to person, place, and time.     Cranial Nerves: No cranial nerve deficit.  Psychiatric:  Behavior: Behavior normal.        Thought Content: Thought content normal.        Judgment: Judgment normal.    Assessment/Plan: 1. Weakness of both lower extremities - Pt is getting PT at home, will get home nursing aide to help her with ADL's  2. Alcoholic cirrhosis of liver with ascites ([place]) - Continue to monitor, follow up with GI   3. Atrial fibrillation, unspecified type ([place]) - Continue Cardizem  - Pt needs to continue apixaban (ELIQUIS) 2.5 MG TABS tablet; Take 1 tablet (2.5 mg total) by mouth 2 (two) times daily.  Dispense: 180 tablet; Refill: 1 - Risks and benefits to be discussed by cardiology and GI  4. Chronic anticoagulation - Prescription od Eliquis is renewed   5. Depression, major, single episode, moderate ([facility]) - Continue Lexapro and add Remeron  - mirtazapine (REMERON) 7.5 MG tablet; Take one tab at night for sleep  Dispense: 90 tablet; Refill: 1  General Counseling: Teryn verbalizes understanding of the findings of todays visit and agrees with plan of treatment. I have discussed any further diagnostic evaluation that may be needed or ordered today. We also reviewed her medications today. she has been encouraged to call the office with any questions or concerns that should arise related to todays  visit.  Meds ordered this encounter  Medications  . apixaban (ELIQUIS) 2.5 MG TABS tablet    Sig: Take 1 tablet (2.5 mg total) by mouth 2 (two) times daily.    Dispense:  180 tablet    Refill:  1    Total time spent:25 Minutes Time spent includes review of chart, medications, test results, and follow up plan with the patient.   Dr Lavera Guise Internal medicine

## 2019-05-09 ENCOUNTER — Ambulatory Visit
Admission: RE | Admit: 2019-05-09 | Discharge: 2019-05-09 | Disposition: A | Discharge status: Home / Self Care | Payer: Medicare Other | Source: Ambulatory Visit | Attending: Gastroenterology | Admitting: Gastroenterology

## 2019-05-09 DIAGNOSIS — R1084 Generalized abdominal pain: Secondary | ICD-10-CM

## 2019-05-09 DIAGNOSIS — K7030 Alcoholic cirrhosis of liver without ascites: Secondary | ICD-10-CM | POA: Diagnosis not present

## 2019-05-09 DIAGNOSIS — K760 Fatty (change of) liver, not elsewhere classified: Secondary | ICD-10-CM

## 2019-05-09 DIAGNOSIS — K7460 Unspecified cirrhosis of liver: Secondary | ICD-10-CM | POA: Diagnosis not present

## 2019-05-09 DIAGNOSIS — K8020 Calculus of gallbladder without cholecystitis without obstruction: Secondary | ICD-10-CM | POA: Diagnosis not present

## 2019-05-09 IMAGING — CT CT ABDOMEN WO/W CM
2 of 11 series · 10 of 46 positions shown, 16 images · IV contrast (omnipaque)
Comparison: None.

CLINICAL DATA: Abdominal pain.  Cirrhosis.

EXAM:
CT ABDOMEN WITHOUT AND WITH CONTRAST
TECHNIQUE: Multidetector CT imaging of the abdomen was performed following the
standard protocol before and following the bolus administration of
intravenous contrast.
CONTRAST:  75mL OMNIPAQUE IOHEXOL 300 MG/ML  SOLN

[Series 7: coronal arterial · coronal · arterial · 0.46mm/px · 3 of 92 slices shown, 4 images]
[im 23/92  soft-tissue]
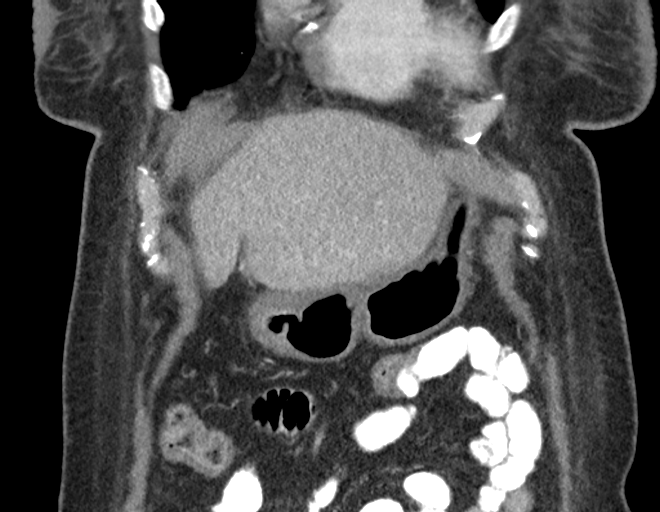
[im 46/92  soft-tissue]
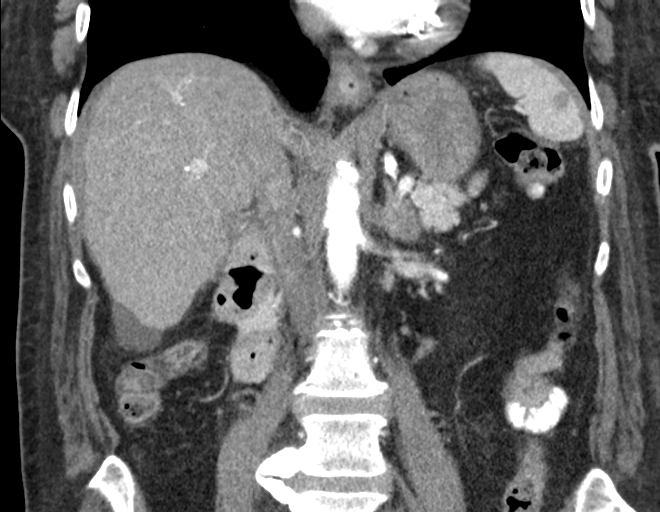
[im 46/92  bone]
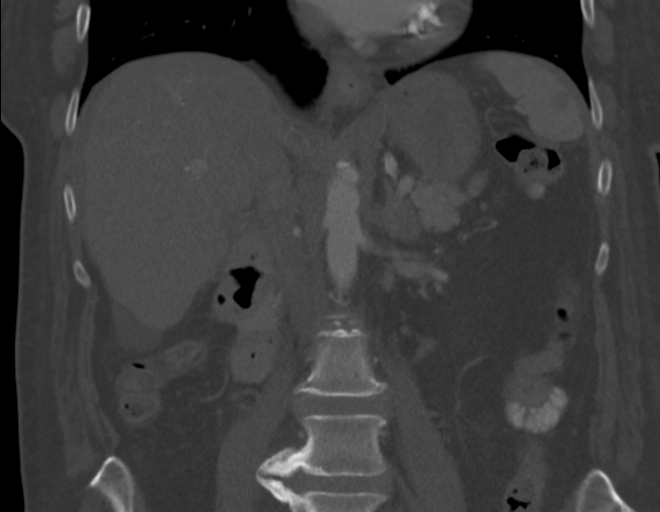
[im 69/92  soft-tissue]
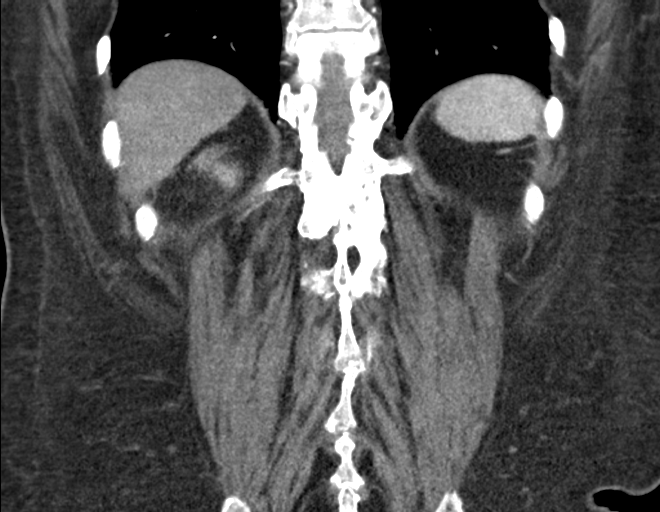

[Series 9: axial venous · axial · portal-venous · 0.64mm/px · z∈[-720,-550]mm · 7 of 77 slices shown, 12 images]
[im 10/77  soft-tissue]
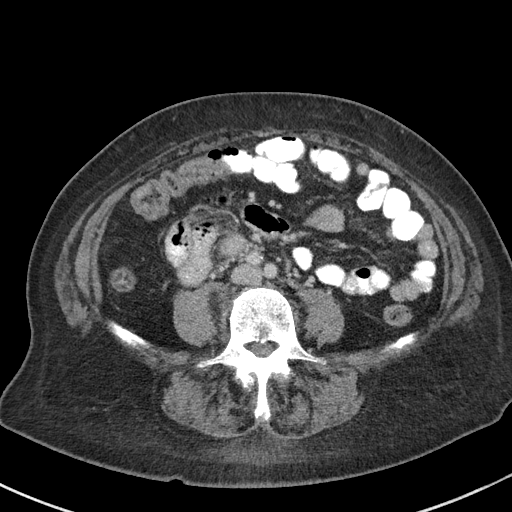
[im 10/77  bone]
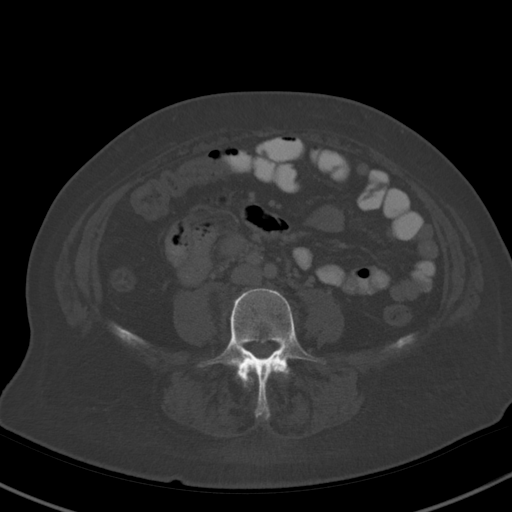
[im 20/77  soft-tissue]
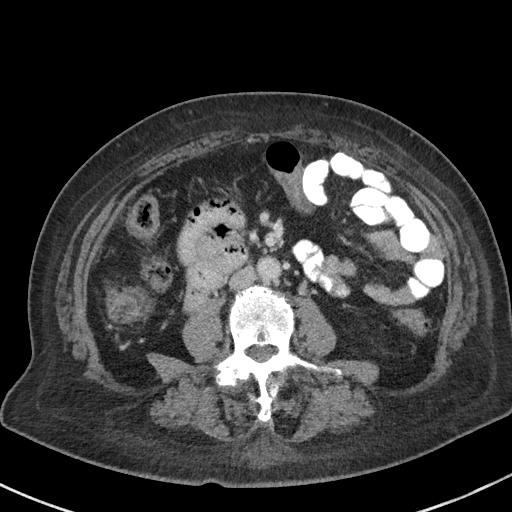
[im 29/77  soft-tissue]
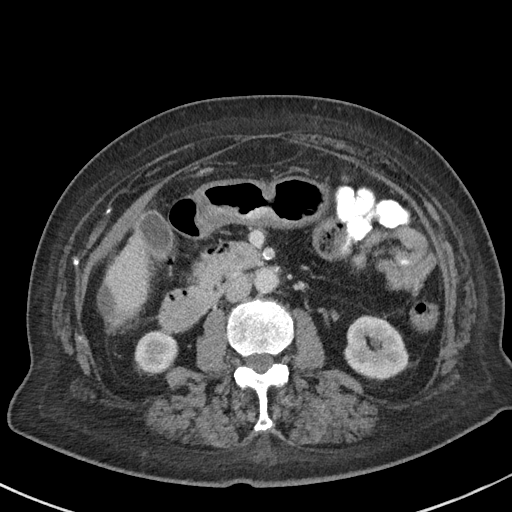
[im 39/77  soft-tissue]
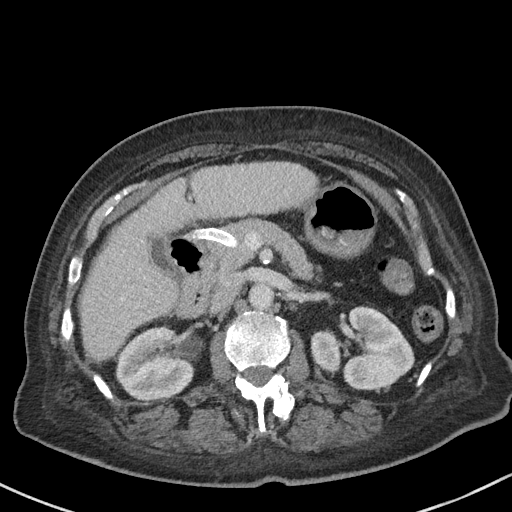
[im 39/77  lung]
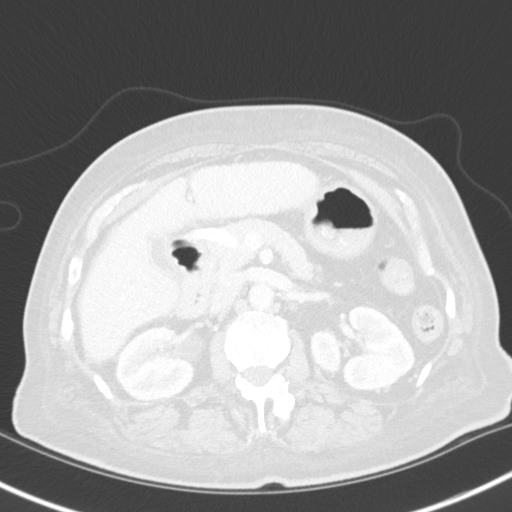
[im 48/77  soft-tissue]
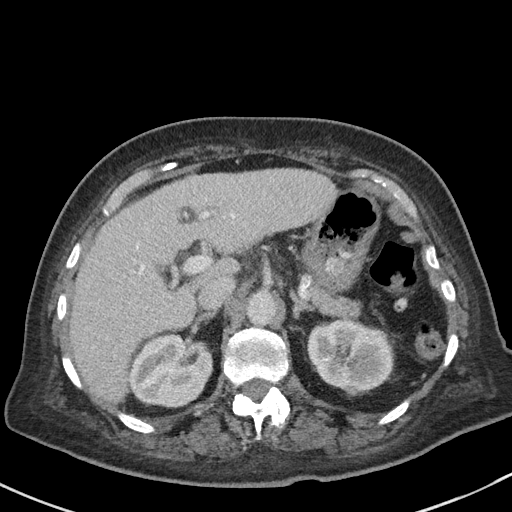
[im 48/77  lung]
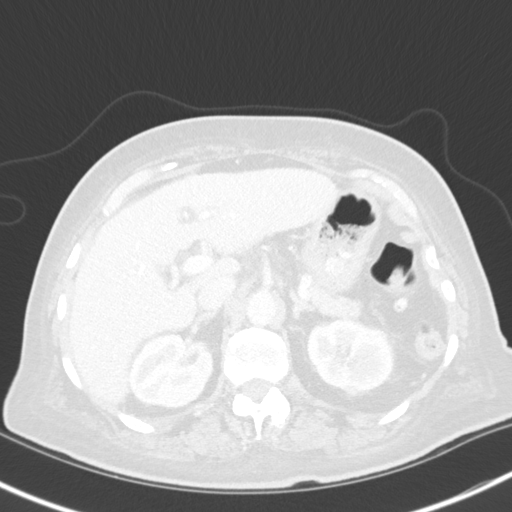
[im 58/77  soft-tissue]
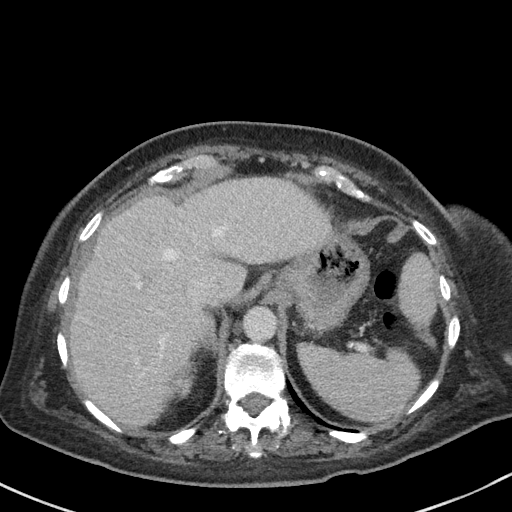
[im 58/77  lung]
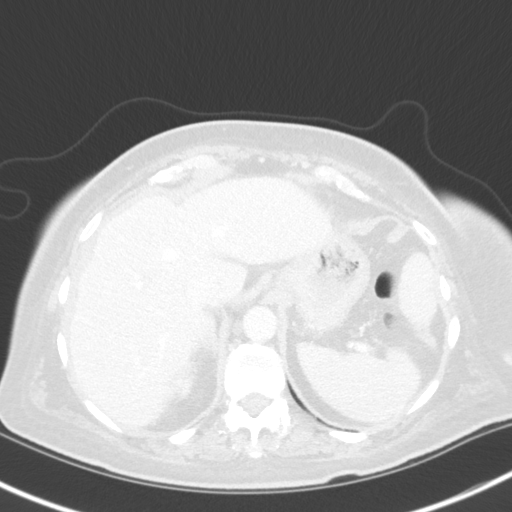
[im 67/77  soft-tissue]
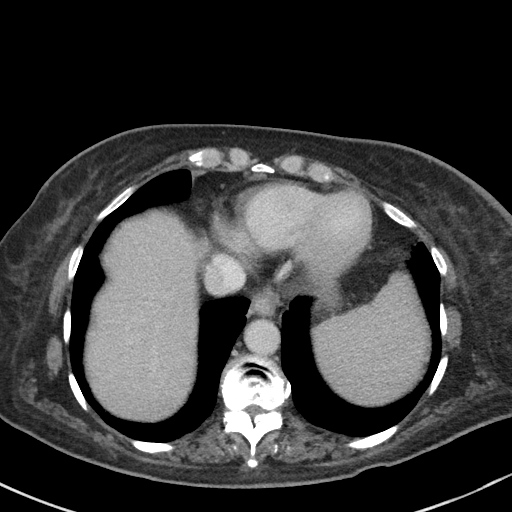
[im 67/77  lung]
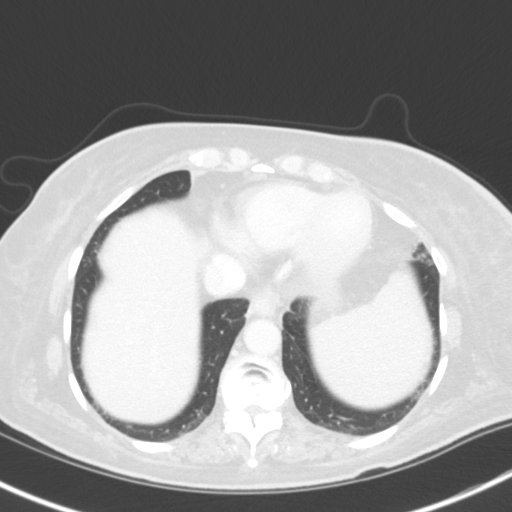

[10 of 46 positions shown; findings below may reference images not displayed]

FINDINGS: Lower chest: No pleural effusion. Annular calcification noted mitral
valve. Coronary artery calcification is evident.

Hepatobiliary: Nodular liver contour compatible with the reported
clinical history of cirrhosis. No focal mass lesion evident within
the liver parenchyma. Tiny gallstones evident. No intrahepatic or
extrahepatic biliary dilation.

Pancreas: No focal mass lesion. No dilatation of the main duct. No
intraparenchymal cyst. No peripancreatic edema. Tiny 3-4 mm
hypoattenuating foci in the tail of pancreas (images 35 and 37 of
series 9).

Spleen: 9 mm hypoattenuating lesion in the anterior spleen is too
small to characterize but likely benign.

Adrenals/Urinary Tract: No adrenal nodule or mass. Kidneys
unremarkable.

Stomach/Bowel: Stomach is unremarkable. No gastric wall thickening.
No evidence of outlet obstruction. Duodenum does not cross the
midline as expected. No small bowel or colonic dilatation within the
visualized abdomen.

Vascular/Lymphatic: There is abdominal aortic atherosclerosis
without aneurysm. There is no gastrohepatic or hepatoduodenal
ligament lymphadenopathy. No retroperitoneal or mesenteric
lymphadenopathy.

Other: Trace volume free fluid noted around the liver inferiorly.

Musculoskeletal: No worrisome lytic or sclerotic osseous
abnormality.
IMPRESSION: 1. Cirrhotic liver morphology without focal mass lesion evident.
2. Trace ascites.
3. Cholelithiasis.
4. Tiny hypodensities in the pancreatic tail, too small to
characterize and potentially cystic. Follow-up CT in 3-6 months
could be used to ensure stability. If the patient is able to
reproducibly breath hold, abdominal MRI could be performed.

## 2019-05-09 MED ORDER — IOHEXOL 300 MG/ML  SOLN
75.0000 mL | Freq: Once | INTRAMUSCULAR | Status: AC | PRN
  Administered 2019-05-09: 75 mL via INTRAVENOUS

## 2019-05-10 DIAGNOSIS — E1140 Type 2 diabetes mellitus with diabetic neuropathy, unspecified: Secondary | ICD-10-CM | POA: Diagnosis not present

## 2019-05-10 DIAGNOSIS — Z7901 Long term (current) use of anticoagulants: Secondary | ICD-10-CM | POA: Diagnosis not present

## 2019-05-10 DIAGNOSIS — R296 Repeated falls: Secondary | ICD-10-CM | POA: Diagnosis not present

## 2019-05-10 DIAGNOSIS — Z9181 History of falling: Secondary | ICD-10-CM | POA: Diagnosis not present

## 2019-05-10 DIAGNOSIS — K219 Gastro-esophageal reflux disease without esophagitis: Secondary | ICD-10-CM | POA: Diagnosis not present

## 2019-05-10 DIAGNOSIS — R5383 Other fatigue: Secondary | ICD-10-CM | POA: Diagnosis not present

## 2019-05-10 DIAGNOSIS — N189 Chronic kidney disease, unspecified: Secondary | ICD-10-CM | POA: Diagnosis not present

## 2019-05-10 DIAGNOSIS — K7460 Unspecified cirrhosis of liver: Secondary | ICD-10-CM | POA: Diagnosis not present

## 2019-05-10 DIAGNOSIS — E785 Hyperlipidemia, unspecified: Secondary | ICD-10-CM | POA: Diagnosis not present

## 2019-05-10 DIAGNOSIS — I129 Hypertensive chronic kidney disease with stage 1 through stage 4 chronic kidney disease, or unspecified chronic kidney disease: Secondary | ICD-10-CM | POA: Diagnosis not present

## 2019-05-10 DIAGNOSIS — R269 Unspecified abnormalities of gait and mobility: Secondary | ICD-10-CM | POA: Diagnosis not present

## 2019-05-10 DIAGNOSIS — E1122 Type 2 diabetes mellitus with diabetic chronic kidney disease: Secondary | ICD-10-CM | POA: Diagnosis not present

## 2019-05-11 DIAGNOSIS — I129 Hypertensive chronic kidney disease with stage 1 through stage 4 chronic kidney disease, or unspecified chronic kidney disease: Secondary | ICD-10-CM | POA: Diagnosis not present

## 2019-05-11 DIAGNOSIS — R5383 Other fatigue: Secondary | ICD-10-CM | POA: Diagnosis not present

## 2019-05-11 DIAGNOSIS — R269 Unspecified abnormalities of gait and mobility: Secondary | ICD-10-CM | POA: Diagnosis not present

## 2019-05-11 DIAGNOSIS — N189 Chronic kidney disease, unspecified: Secondary | ICD-10-CM | POA: Diagnosis not present

## 2019-05-11 DIAGNOSIS — E1140 Type 2 diabetes mellitus with diabetic neuropathy, unspecified: Secondary | ICD-10-CM | POA: Diagnosis not present

## 2019-05-11 DIAGNOSIS — R296 Repeated falls: Secondary | ICD-10-CM | POA: Diagnosis not present

## 2019-05-11 DIAGNOSIS — K219 Gastro-esophageal reflux disease without esophagitis: Secondary | ICD-10-CM | POA: Diagnosis not present

## 2019-05-11 DIAGNOSIS — E785 Hyperlipidemia, unspecified: Secondary | ICD-10-CM | POA: Diagnosis not present

## 2019-05-11 DIAGNOSIS — E1122 Type 2 diabetes mellitus with diabetic chronic kidney disease: Secondary | ICD-10-CM | POA: Diagnosis not present

## 2019-05-11 DIAGNOSIS — Z9181 History of falling: Secondary | ICD-10-CM | POA: Diagnosis not present

## 2019-05-11 DIAGNOSIS — Z7901 Long term (current) use of anticoagulants: Secondary | ICD-10-CM | POA: Diagnosis not present

## 2019-05-11 DIAGNOSIS — K7460 Unspecified cirrhosis of liver: Secondary | ICD-10-CM | POA: Diagnosis not present

## 2019-05-14 DIAGNOSIS — Z9181 History of falling: Secondary | ICD-10-CM | POA: Diagnosis not present

## 2019-05-14 DIAGNOSIS — I129 Hypertensive chronic kidney disease with stage 1 through stage 4 chronic kidney disease, or unspecified chronic kidney disease: Secondary | ICD-10-CM | POA: Diagnosis not present

## 2019-05-14 DIAGNOSIS — E785 Hyperlipidemia, unspecified: Secondary | ICD-10-CM | POA: Diagnosis not present

## 2019-05-14 DIAGNOSIS — R5383 Other fatigue: Secondary | ICD-10-CM | POA: Diagnosis not present

## 2019-05-14 DIAGNOSIS — K219 Gastro-esophageal reflux disease without esophagitis: Secondary | ICD-10-CM | POA: Diagnosis not present

## 2019-05-14 DIAGNOSIS — R296 Repeated falls: Secondary | ICD-10-CM | POA: Diagnosis not present

## 2019-05-14 DIAGNOSIS — N189 Chronic kidney disease, unspecified: Secondary | ICD-10-CM | POA: Diagnosis not present

## 2019-05-14 DIAGNOSIS — E1122 Type 2 diabetes mellitus with diabetic chronic kidney disease: Secondary | ICD-10-CM | POA: Diagnosis not present

## 2019-05-14 DIAGNOSIS — E1140 Type 2 diabetes mellitus with diabetic neuropathy, unspecified: Secondary | ICD-10-CM | POA: Diagnosis not present

## 2019-05-14 DIAGNOSIS — R269 Unspecified abnormalities of gait and mobility: Secondary | ICD-10-CM | POA: Diagnosis not present

## 2019-05-14 DIAGNOSIS — K7460 Unspecified cirrhosis of liver: Secondary | ICD-10-CM | POA: Diagnosis not present

## 2019-05-14 DIAGNOSIS — Z7901 Long term (current) use of anticoagulants: Secondary | ICD-10-CM | POA: Diagnosis not present

## 2019-05-15 DIAGNOSIS — N189 Chronic kidney disease, unspecified: Secondary | ICD-10-CM | POA: Diagnosis not present

## 2019-05-15 DIAGNOSIS — R296 Repeated falls: Secondary | ICD-10-CM | POA: Diagnosis not present

## 2019-05-15 DIAGNOSIS — E785 Hyperlipidemia, unspecified: Secondary | ICD-10-CM | POA: Diagnosis not present

## 2019-05-15 DIAGNOSIS — E1122 Type 2 diabetes mellitus with diabetic chronic kidney disease: Secondary | ICD-10-CM | POA: Diagnosis not present

## 2019-05-15 DIAGNOSIS — E1140 Type 2 diabetes mellitus with diabetic neuropathy, unspecified: Secondary | ICD-10-CM | POA: Diagnosis not present

## 2019-05-15 DIAGNOSIS — K7460 Unspecified cirrhosis of liver: Secondary | ICD-10-CM | POA: Diagnosis not present

## 2019-05-15 DIAGNOSIS — Z9181 History of falling: Secondary | ICD-10-CM | POA: Diagnosis not present

## 2019-05-15 DIAGNOSIS — K219 Gastro-esophageal reflux disease without esophagitis: Secondary | ICD-10-CM | POA: Diagnosis not present

## 2019-05-15 DIAGNOSIS — I129 Hypertensive chronic kidney disease with stage 1 through stage 4 chronic kidney disease, or unspecified chronic kidney disease: Secondary | ICD-10-CM | POA: Diagnosis not present

## 2019-05-15 DIAGNOSIS — Z7901 Long term (current) use of anticoagulants: Secondary | ICD-10-CM | POA: Diagnosis not present

## 2019-05-15 DIAGNOSIS — R5383 Other fatigue: Secondary | ICD-10-CM | POA: Diagnosis not present

## 2019-05-15 DIAGNOSIS — R269 Unspecified abnormalities of gait and mobility: Secondary | ICD-10-CM | POA: Diagnosis not present

## 2019-05-16 ENCOUNTER — Telehealth: Payer: Self-pay

## 2019-05-16 NOTE — Telephone Encounter (Signed)
refaxed form for personal care services today again

## 2019-05-23 ENCOUNTER — Ambulatory Visit: Payer: Medicare Other | Admitting: Gastroenterology

## 2019-05-30 ENCOUNTER — Telehealth: Payer: Self-pay | Admitting: Internal Medicine

## 2019-05-30 MED ORDER — FUROSEMIDE 20 MG PO TABS: ORAL_TABLET | ORAL | 4 refills | Status: DC

## 2019-05-30 NOTE — Telephone Encounter (Signed)
Spoke with patient. Says she has gained 2 pounds over the past 2 weeks. Today weight 159 pounds. Unclear of what her normal weight is. States her normal we Her ankles and legs are swollen to the point it is difficult for her to walk around. States she is more SOB with exertion because it takes more energy to get up to move around. We reviewed her med list and she is taking furosemide 20 mg daily. No longer taking potassium.  Med list is correct. Last BMET was 04/15/20.  Advised patient to take an extra dose of furosemide today, to continue to monitor her weight and swelling. Offered her appointment for later this afternoon or Friday but patient has transportation and must let them know 3 day sin advance for an appointment.  Went ahead and scheduled patient for next Wednesday, 06/06/19 to see Laurann Montana, NP. Routing to Dr End for further review.

## 2019-05-30 NOTE — Telephone Encounter (Signed)
I recommend increasing furosemide to 40 mg daily until edema and weight gain are back to baseline.  At that point, she can return back to furosemide 20 mg daily.  She should minimize her sodium intake and f/u as planned with Laurann Montana, NP, next week.  Nelva Bush, MD [facility]

## 2019-05-30 NOTE — Telephone Encounter (Signed)
Called patient and she verbalized understanding of Dr Darnelle Bos recommendations. She is aware to keep appointment for next week. States she eats canned Ravioli and soups. Advised her to make sure they said they are low or no sodium. Rx sent to pharmacy.

## 2019-05-30 NOTE — Telephone Encounter (Signed)
Pt c/o swelling: STAT is pt has developed SOB within 24 hours  1) How much weight have you gained and in what time span? 2 pounds in 2 weeks  2) If swelling, where is the swelling located? Bilateral ankles and legs  Are you currently taking a fluid pill? Not sure  3) Are you currently SOB? Once she starts walking around  4) Do you have a log of your daily weights (if so, list)? 2 weeks ago 157, and now 159  5) Have you gained 3 pounds in a day or 5 pounds in a week? No  6) Have you traveled recently? no

## 2019-05-31 ENCOUNTER — Ambulatory Visit (INDEPENDENT_AMBULATORY_CARE_PROVIDER_SITE_OTHER): Payer: Medicare Other | Admitting: Gastroenterology

## 2019-05-31 ENCOUNTER — Other Ambulatory Visit: Payer: Self-pay

## 2019-05-31 ENCOUNTER — Encounter (INDEPENDENT_AMBULATORY_CARE_PROVIDER_SITE_OTHER): Payer: Self-pay

## 2019-05-31 ENCOUNTER — Encounter: Payer: Self-pay | Admitting: Gastroenterology

## 2019-05-31 VITALS — BP 133/83 | HR 98 | Temp 98.6°F | Wt 164.3 lb

## 2019-05-31 DIAGNOSIS — R2243 Localized swelling, mass and lump, lower limb, bilateral: Secondary | ICD-10-CM | POA: Diagnosis not present

## 2019-05-31 DIAGNOSIS — K7030 Alcoholic cirrhosis of liver without ascites: Secondary | ICD-10-CM

## 2019-05-31 DIAGNOSIS — R772 Abnormality of alphafetoprotein: Secondary | ICD-10-CM

## 2019-05-31 DIAGNOSIS — R778 Other specified abnormalities of plasma proteins: Secondary | ICD-10-CM | POA: Diagnosis not present

## 2019-05-31 DIAGNOSIS — Z1289 Encounter for screening for malignant neoplasm of other sites: Secondary | ICD-10-CM | POA: Diagnosis not present

## 2019-05-31 NOTE — Progress Notes (Signed)
Cephas Darby, MD [address]  [place]  [place], [place] [address]   Main: [phone]  Fax: [phone]    Gastroenterology Consultation  Referring Provider:     Ronnell Freshwater, NP Primary Care Physician:  Ronnell Freshwater, NP Primary Gastroenterologist:  Dr. Cephas Darby Reason for Consultation:  Anemia, Alcoholic cirrhosis        HPI:   Adriana Miller is a 64 y.o. female referred by Dr. Ronnell Freshwater, NP  for consultation & management of anemia, alcoholic cirrhosis Patient has history of alcoholic cirrhosis, history of cerebral aneurysm on Eliquis was recently admitted to [facility] on 02/08/2019 which presented with bloody diarrhea and anemia.  She underwent EGD and colonoscopy.  EGD revealed bleeding erosion in the stomach that was cauterized.  There was evidence of portal hypertensive gastropathy.  Colonoscopy revealed small polyps that were removed.  Since discharge, patient acknowledges that she did not drink alcohol. She lives alone.  She has poor appetite and has been eating once a day and sometimes does not eat all day.  She is losing weight.  She has a brother who visits her 2-3 times a week.  She reports that her PCP is working on arranging an aide for her She denies abdominal pain, nausea, vomiting, black stools, fever, chills, swelling of legs or abdominal distention, rectal bleeding, diarrhea  Follow-up visit 05/31/19 She had hypokalemia, repleted with potassium supplements.  She also underwent CT liver mass protocol which did not reveal any liver lesions.  Likely pancreatic cystic lesions which were tiny.  Trace ascites, cholelithiasis.  Today, she reports doing well.  She is able to manage her ADLs.  She does report bilateral swelling of feet and is currently on diuretics, managed by cardiology, increased Lasix to 40 mg.  She acknowledges eating pickle every now and then as well as canned soups.  She said she is not allowed to cook by herself as she  burned the food in the past.  She does state she is not using any saltshakers.  Patient did not bring medications with her today and she said she does not know what she is taking.  She said she has a pillbox that is set up for her and she takes pills from it daily.  She does report intermittent constipation.  She denies black stools, rectal bleeding, abdominal pain or distention.  She does not drink alcohol anymore  NSAIDs: None  Antiplts/Anticoagulants/Anti thrombotics: Eliquis for history of A. fib  GI Procedures:  EGD and colonoscopy 02/12/19 - Normal duodenal bulb and second portion of the duodenum. - Portal hypertensive gastropathy. - Bleeding erosive gastropathy. Treated with bipolar cautery. - Small hiatal hernia. - Normal gastroesophageal junction and esophagus.  - Preparation of the colon was poor. - Hemorrhoids found on perianal exam. - Three 3 to 5 mm polyps in the sigmoid colon, in the descending colon and in the ascending colon. - Non-bleeding external and internal hemorrhoids. - Stool in the entire examined colon.  DIAGNOSIS:  A. COLON POLYP, ASCENDING; COLD SNARE:  - TUBULAR ADENOMA.  - NEGATIVE FOR HIGH-GRADE DYSPLASIA AND MALIGNANCY.   B. COLON POLYPS X2, DESCENDING; COLD SNARE:  - FRAGMENTS (X3) OF TUBULAR ADENOMAS.  - NEGATIVE FOR HIGH-GRADE DYSPLASIA AND MALIGNANCY.  Colonoscopy 06/25/2014 DIAGNOSIS:  A. COLON POLYP, SIGMOID; HOT SNARE:  - TUBULAR ADENOMA.  - NEGATIVE FOR HIGH-GRADE DYSPLASIA AND MALIGNANCY.   B. COLON POLYP, DESCENDING; COLD SNARE:  - TUBULAR ADENOMA.  - NEGATIVE  FOR HIGH-GRADE DYSPLASIA AND MALIGNANCY.   C. COLON POLYP, CECUM; COLD BIOPSY:  - TUBULAR ADENOMA.  - NEGATIVE FOR HIGH-GRADE DYSPLASIA AND MALIGNANCY.   D. COLON POLYP, ASCENDING; HOT SNARE:  - TUBULAR ADENOMA, FRAGMENTS.  - NEGATIVE FOR HIGH-GRADE DYSPLASIA AND MALIGNANCY.   E. COLON POLYP, DESCENDING; COLD BIOPSY:  - TUBULAR ADENOMA.  - NEGATIVE FOR HIGH-GRADE DYSPLASIA  AND MALIGNANCY.   Past Medical History:  Diagnosis Date  . Cerebral aneurysm   . Chronic kidney disease   . Diabetes mellitus without complication ([place])   . Hyperlipidemia   . Hypertension   . Seizure disorder [facility])     Past Surgical History:  Procedure Laterality Date  . CEREBRAL ANEURYSM REPAIR  1991  . COLONOSCOPY WITH PROPOFOL N/A 02/12/2019   Procedure: COLONOSCOPY WITH PROPOFOL;  Surgeon: Lin Landsman, MD;  Location: [facility] ENDOSCOPY;  Service: Gastroenterology;  Laterality: N/A;  . ESOPHAGOGASTRODUODENOSCOPY (EGD) WITH PROPOFOL N/A 02/12/2019   Procedure: ESOPHAGOGASTRODUODENOSCOPY (EGD) WITH PROPOFOL;  Surgeon: Lin Landsman, MD;  Location: [facility] ENDOSCOPY;  Service: Gastroenterology;  Laterality: N/A;    Current Outpatient Medications:  .  amLODipine (NORVASC) 5 MG tablet, Take 5 mg by mouth daily., Disp: , Rfl:  .  apixaban (ELIQUIS) 2.5 MG TABS tablet, Take 1 tablet (2.5 mg total) by mouth 2 (two) times daily., Disp: 180 tablet, Rfl: 1 .  diltiazem (CARDIZEM CD) 120 MG 24 hr capsule, Take 1 capsule (120 mg total) by mouth daily., Disp: 30 capsule, Rfl: 0 .  enalapril (VASOTEC) 10 MG tablet, Take 5 mg by mouth daily., Disp: , Rfl:  .  escitalopram (LEXAPRO) 10 MG tablet, Take 1 tablet (10 mg total) by mouth daily. For anxiety/sleep, Disp: 90 tablet, Rfl: 2 .  furosemide (LASIX) 20 MG tablet, Take 2 tablets (40 mg) by mouth once a day until swelling and weight back to baseline, then go back to 20 mg by mouth daily., Disp: 60 tablet, Rfl: 4 .  mirtazapine (REMERON) 7.5 MG tablet, Take one tab at night for sleep, Disp: 90 tablet, Rfl: 1 .  potassium chloride SA (KLOR-CON) 20 MEQ tablet, Take 2 tablets (40 mEq total) by mouth daily., Disp: 6 tablet, Rfl: 0 .  spironolactone (ALDACTONE) 25 MG tablet, Take 1 tablet (25 mg total) by mouth daily. For cirrhosis of liver, Disp: 90 tablet, Rfl: 3   Family History  Problem Relation Age of Onset  . Heart attack Brother 35      Social History   Tobacco Use  . Smoking status: Never Smoker  . Smokeless tobacco: Current User    Types: Snuff  Substance Use Topics  . Alcohol use: Not Currently    Alcohol/week: 14.0 standard drinks    Types: 14 Cans of beer per week    Comment: pt has cut back on alcohol use/stated not currently drinking  . Drug use: No    Allergies as of 05/31/2019 - Review Complete 05/31/2019  Allergen Reaction Noted  . Aspirin Other (See Comments) 05/19/2015    Review of Systems:    All systems reviewed and negative except where noted in HPI.   Physical Exam:  BP 133/83 (BP Location: Left Arm, Patient Position: Sitting, Cuff Size: Normal)   Pulse 98   Temp 98.6 F (37 C) (Oral)   Wt 164 lb 5 oz (74.5 kg)   BMI 26.52 kg/m  No LMP recorded. Patient is postmenopausal.  General:   Alert, moderately nourished, cachectic and cooperative in NAD Head:  Normocephalic and  atraumatic, bitemporal wasting. Eyes:  Sclera clear, no icterus.   Conjunctiva pink. Ears:  Normal auditory acuity. Nose:  No deformity, discharge, or lesions. Mouth:  No deformity or lesions,oropharynx pink & moist. Neck:  Supple; no masses or thyromegaly. Lungs:  Respirations even and unlabored.  Clear throughout to auscultation.   No wheezes, crackles, or rhonchi. No acute distress. Heart:  Regular rate and rhythm; no murmurs, clicks, rubs, or gallops. Abdomen:  Normal bowel sounds. Soft, non-tender and non-distended without masses, hepatosplenomegaly or hernias noted.  No guarding or rebound tenderness.   Rectal: Not performed Msk:  Symmetrical without gross deformities. Good, equal movement & strength bilaterally. Pulses:  Normal pulses noted. Extremities:  No clubbing, 2+ edema.  No cyanosis. Neurologic:  Alert and oriented x3;  grossly normal neurologically. Skin: Dry scaly skin, intact without significant lesions or rashes. No jaundice. Lymph Nodes:  No significant cervical adenopathy. Psych:  Alert and  cooperative. Normal mood and affect.  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 female with history of diabetes, CKD, alcoholic cirrhosis, history of A. fib, brain aneurysm  Alcoholic cirrhosis, well compensated Portal hypertension manifested as thrombocytopenia and portal hypertensive gastropathy No evidence of varices, has minimal ascites based on the CT scan Maintain low-sodium diet, reiterated today, avoid pickles, canned soups PSE none HCC screening, ultrasound liver in 01/2019 did not reveal any liver lesions, serum AFP levels are significantly elevated most likely secondary to active alcohol use recently.  Follow-up CT liver mass protocol did not reveal any liver lesions Recheck AFP today Acute viral hepatitis panel was negative, HCV antibody positive Patient was treated for hep C in the past. HCVRNA not detected, hepatitis B surface antibody positive, hepatitis B core antibody total positive  Continue to remain abstinent from alcohol  Anemia Improving, iron studies consistent with anemia of chronic disease Normal folate and B12 levels  Elevated total protein levels, low albumin Recommend referral to hematology for further evaluation   Follow up in 52months   Cephas Darby, MD

## 2019-06-01 LAB — AFP TUMOR MARKER: AFP, Serum, Tumor Marker: 337 ng/mL — ABNORMAL HIGH (ref 0.0–8.3)

## 2019-06-06 ENCOUNTER — Telehealth: Payer: Self-pay

## 2019-06-06 ENCOUNTER — Other Ambulatory Visit: Payer: Self-pay

## 2019-06-06 ENCOUNTER — Ambulatory Visit (INDEPENDENT_AMBULATORY_CARE_PROVIDER_SITE_OTHER): Payer: Medicare Other | Admitting: Family

## 2019-06-06 ENCOUNTER — Encounter: Payer: Self-pay | Admitting: Family

## 2019-06-06 VITALS — BP 110/62 | HR 119 | Ht 66.0 in | Wt 158.0 lb

## 2019-06-06 DIAGNOSIS — Z7901 Long term (current) use of anticoagulants: Secondary | ICD-10-CM

## 2019-06-06 DIAGNOSIS — I5032 Chronic diastolic (congestive) heart failure: Secondary | ICD-10-CM | POA: Diagnosis not present

## 2019-06-06 DIAGNOSIS — I4892 Unspecified atrial flutter: Secondary | ICD-10-CM | POA: Diagnosis not present

## 2019-06-06 DIAGNOSIS — I4819 Other persistent atrial fibrillation: Secondary | ICD-10-CM | POA: Diagnosis not present

## 2019-06-06 DIAGNOSIS — I10 Essential (primary) hypertension: Secondary | ICD-10-CM

## 2019-06-06 DIAGNOSIS — K7030 Alcoholic cirrhosis of liver without ascites: Secondary | ICD-10-CM

## 2019-06-06 MED ORDER — METOPROLOL TARTRATE 25 MG PO TABS: 12.5000 mg | ORAL_TABLET | Freq: Two times a day (BID) | ORAL | 2 refills | Status: DC

## 2019-06-06 NOTE — Patient Instructions (Addendum)
Medication Instructions:  Your physician has recommended you make the following change in your medication:   STOP Enalapril ( you were not taking - this is the medication we put an 'x' on today )   START Metoprolol Tartrate 12.5mg  twice daily  CONTINUE Eliquis  CONTINUE Lasix 20mg  daily  *If you need a refill on your cardiac medications before your next appointment, please call your pharmacy*  Lab Work: Your physician recommends that you return for lab work today: CBC, BMET, magnesium, TSH  If you have labs (blood work) drawn today and your tests are completely normal, you will receive your results only by: Marland Kitchen MyChart Message (if you have MyChart) OR . A paper copy in the mail If you have any lab test that is abnormal or we need to change your treatment, we will call you to review the results.  Testing/Procedures: Your EKG showed atrial flutter. This is a fast heart beat in the top chambers of your heart. The new medicine will help to slow this heart rate down.  Follow-Up: At [facility], you and your health needs are our priority.  As part of our continuing mission to provide you with exceptional heart care, we have created designated Provider Care Teams.  These Care Teams include your primary Cardiologist (physician) and Advanced Practice Providers (APPs -  Physician Assistants and Nurse Practitioners) who all work together to provide you with the care you need, when you need it.  Your next appointment:   2 week(s)  The format for your next appointment:   In Person  Provider:    You may see Nelva Bush, MD or one of the following Advanced Practice Providers on your designated Care Team:    Murray Hodgkins, NP  Christell Faith, PA-C  Marrianne Mood, PA-C   Other Instructions  Please pick up the Metoprolol today and start taking this evening.   Make sure you do not miss any doses of your Eliquis (blood thinner).

## 2019-06-06 NOTE — Telephone Encounter (Signed)
VERBAL ORDER SIGNED AND PLACED IN [facility].

## 2019-06-06 NOTE — Progress Notes (Signed)
.  Office Visit    Patient Name: Adriana Miller Date of Encounter: 06/06/2019  Primary Care Provider:  Ronnell Freshwater, NP Primary Cardiologist:  No primary care provider on file. Electrophysiologist:  None   Chief Complaint    Adriana Miller is a 64 y.o. female with a hx of persistent atrial fibrillation, HTN, HLD, CKD, alcoholic cirrhosis complicated by GI leed 123XX123, seizure disorder, portal hypertension, anemia, cerebral aneurysm s/p repair presents today for lower extremity edema.   Past Medical History    Past Medical History:  Diagnosis Date  . B12 deficiency 04/05/2017  . Blood in stool   . Cerebral aneurysm   . Chronic kidney disease   . Diabetes mellitus without complication ([place])   . Gastric erosion with bleeding   . Hyperlipidemia   . Hypertension   . Lower extremity cellulitis 02/08/2019  . Seizure disorder [facility])    Past Surgical History:  Procedure Laterality Date  . CEREBRAL ANEURYSM REPAIR  1991  . COLONOSCOPY WITH PROPOFOL N/A 02/12/2019   Procedure: COLONOSCOPY WITH PROPOFOL;  Surgeon: Lin Landsman, MD;  Location[facility] ENDOSCOPY;  Service: Gastroenterology;  Laterality: N/A;  . ESOPHAGOGASTRODUODENOSCOPY (EGD) WITH PROPOFOL N/A 02/12/2019   Procedure: ESOPHAGOGASTRODUODENOSCOPY (EGD) WITH PROPOFOL;  Surgeon: Lin Landsman, MD;  Location: [facility] ENDOSCOPY;  Service: Gastroenterology;  Laterality: N/A;    Allergies  Allergies  Allergen Reactions  . Aspirin Other (See Comments)    Nose bleed    History of Present Illness    Adriana Miller is a 64 y.o. female with a hx of  hx of persistent atrial fibrillation, HTN, HLD, CKD, alcoholic cirrhosis complicated by GI leed 123XX123, seizure disorder, portal hypertension, anemia, cerebral aneurysm s/p repair. She was last seen by Dr. Saunders Revel 03/2019.  Telephone encounter 05/30/19 with weight gain and LE edema. Recommended to increase her Lsix to 40mg  daily for a few days until swelling resolved.    Seen by GI 05/31/19. Recommended referral to hematology for elevated total protein and low albumin.   Tells me she took extra lasix x3-4 days then went back to 20mg . Swelling and shortness of breath have resolved.   Does not check BP at home. NP low normal today. No lightheadedness, dizziness.   Tells me she does not add salt to her foods, but does eat precooked foods. She has fallen asleep with food cooking before and her family prefers her not to cook. Eatas lots of frozen foods and canned soups. Recommended choosing low sodium versions.  We discussed the importance of elevating lower extremities when sitting.   EKG today shows atrial flutter rate 119bpm. She is unaware of her palpitations and rapid heart rate. Tells me she uses a pill organizer for her medications, but cannot confirm she has not missed a dose of Eliquis in the last few weeks. Denies bleeding complications.  EKGs/Labs/Other Studies Reviewed:   The following studies were reviewed today:  EKG:  EKG is ordered today.  The ekg ordered today demonstrates atrial flutter 119 bpm.  Recent Labs: 01/08/2019: TSH 2.104 02/13/2019: Magnesium 1.9 03/28/2019: ALT 16; Hemoglobin 11.3; Platelets 164 04/16/2019: BUN 15; Creatinine, Ser 1.46; Potassium 3.9; Sodium 135  Recent Lipid Panel    Component Value Date/Time   CHOL 134 01/27/2018 1340   TRIG 60 01/27/2018 1340   HDL 80 01/27/2018 1340   LDLCALC 42 01/27/2018 1340    Home Medications   Current Meds  Medication Sig  . amLODipine (NORVASC) 5 MG tablet Take  5 mg by mouth daily.  Marland Kitchen apixaban (ELIQUIS) 2.5 MG TABS tablet Take 1 tablet (2.5 mg total) by mouth 2 (two) times daily.  Marland Kitchen diltiazem (CARDIZEM CD) 120 MG 24 hr capsule Take 1 capsule (120 mg total) by mouth daily.  . enalapril (VASOTEC) 10 MG tablet Take 5 mg by mouth daily.  . furosemide (LASIX) 20 MG tablet Take 2 tablets (40 mg) by mouth once a day until swelling and weight back to baseline, then go back to 20 mg by  mouth daily.  . mirtazapine (REMERON) 7.5 MG tablet Take one tab at night for sleep  . spironolactone (ALDACTONE) 25 MG tablet Take 1 tablet (25 mg total) by mouth daily. For cirrhosis of liver      Review of Systems      Review of Systems  Constitution: Negative for chills, fever and malaise/fatigue.  Cardiovascular: Positive for dyspnea on exertion and leg swelling ("much better"). Negative for chest pain, irregular heartbeat, near-syncope, orthopnea, palpitations and syncope.  Respiratory: Negative for cough, shortness of breath and wheezing.   Gastrointestinal: Negative for melena, nausea and vomiting.  Genitourinary: Negative for hematuria.  Neurological: Negative for dizziness, light-headedness and weakness.   All other systems reviewed and are otherwise negative except as noted above.  Physical Exam    VS:  BP 110/62 (BP Location: Left Arm, Patient Position: Sitting, Cuff Size: Normal)   Pulse (!) 119   Ht 5\' 6"  (1.676 m)   Wt 158 lb (71.7 kg)   SpO2 98%   BMI 25.50 kg/m  , BMI Body mass index is 25.5 kg/m. GEN: Well nourished, well developed, in no acute distress. HEENT: normal. Neck: Supple, no JVD, carotid bruits, or masses. Cardiac: tachycardic, no murmurs, rubs, or gallops. No clubbing, cyanosis, edema.  Radials/DP/PT 2+ and equal bilaterally.  Respiratory:  Respirations regular and unlabored, clear to auscultation bilaterally. GI: Soft, nontender, nondistended. MS: No deformity or atrophy. Skin: Warm and dry, no rash. Neuro:  Strength and sensation are intact. Psych: Normal affect.  Accessory Clinical Findings    ECG personally reviewed by me today - atrial flutter 119 bpm with variable AV block, minimal criteria for LVH - no acute changes.  Assessment & Plan    1. Persistent atrial fibrillation/Atrial flutter - Persistent atrial fibrillation with most recent atrial fib November without symptoms. Was in [place] 04/19/19. Today EKG shows new-onset atrial flutter  119 bpm. Anticipate this is the cause of her recent fluid retention. Beta blocker stopped 02/2019 by PCP for bradycardia. Case reviewed with Dr. Saunders Revel in the office today. Will start Metoprolol Tartrate at reduced dose of 12.24m BID. Follow up in 2 weeks. If unable to self convert, plan for cardioversion. Continue Diltiazem 120mg  daily.  Will monitor carefully for bradycardia, if noted consider ZIO.  CBC, BMET, magnesium, TSH today to assess for cause of atrial flutter.  2. Long term current use of anticoagulation - Hx of GI bleed Oct 2020. Has since resumed anticoagulation with approval of GI. Presently on Eliquis 2.5mg  BID. Unclear why she is on reduced dose as she does not meet age nor BMI qualifications. Will send note to prescribing provider.   3. Chronic HFpEF - Euvolemic on exam, possibly slightly over diuresed with low normal BP and atrial flutter 119 bpm. NYHA I-II. Continue Lasix 20mg  and Spironolactone 25mg  daily. BMET today.   4. Cirrhosis - Continue to follow with GI and PCP.   5. HTN - BP low normal today. No lightheadedness nor dizziness. HerEnalapril  was previously discontinued by Dr. Saunders Revel for relative hypotension though she still has a bottle, but has not been taking. We will remove from medicine list and I have put an 'x' on the bottle so she does not take further.   6. CKDIII - Careful titration of diuretics and antihypertensive agents. BMET today.   Disposition: Follow up in 3 week(s) with Dr. Saunders Revel or APP.    Loel Dubonnet, NP 06/06/2019, 3:15 PM

## 2019-06-07 MED ORDER — METOPROLOL TARTRATE 25 MG PO TABS: 12.5000 mg | ORAL_TABLET | Freq: Two times a day (BID) | ORAL | 2 refills | Status: DC

## 2019-06-07 NOTE — Addendum Note (Signed)
Addended by: Ronaldo Miyamoto on: 06/07/2019 02:42 PM   Modules accepted: Orders

## 2019-06-08 ENCOUNTER — Telehealth: Payer: Self-pay

## 2019-06-08 LAB — CBC

## 2019-06-08 LAB — BASIC METABOLIC PANEL
BUN/Creatinine Ratio: 19 (ref 12–28)
BUN: 24 mg/dL (ref 8–27)
CO2: 21 mmol/L (ref 20–29)
Calcium: 9.5 mg/dL (ref 8.7–10.3)
Chloride: 102 mmol/L (ref 96–106)
Creatinine, Ser: 1.24 mg/dL — ABNORMAL HIGH (ref 0.57–1.00)
GFR calc Af Amer: 53 mL/min/{1.73_m2} — ABNORMAL LOW (ref >59)
GFR calc non Af Amer: 46 mL/min/{1.73_m2} — ABNORMAL LOW (ref >59)
Glucose: 215 mg/dL — ABNORMAL HIGH (ref 65–99)
Potassium: 4.7 mmol/L (ref 3.5–5.2)
Sodium: 140 mmol/L (ref 134–144)

## 2019-06-08 LAB — TSH: TSH: 3.04 u[IU]/mL (ref 0.450–4.500)

## 2019-06-08 LAB — MAGNESIUM: Magnesium: 1.8 mg/dL (ref 1.6–2.3)

## 2019-06-08 NOTE — Telephone Encounter (Signed)
Call to patient to review lab results.   No new orders at this time. Pt verbalized understanding and has no further questions.   Advised pt to call for any further questions or concerns.

## 2019-06-08 NOTE — Telephone Encounter (Signed)
-----   Message from Loel Dubonnet, NP sent at 06/07/2019  3:22 PM EST ----- Await result of CBC, will send new result note when obtained.  Kidney function stable. Normal electrolytes. Thyroid function normal. Glucose (blood sugar) elevated - recommend reducing intake of sugars and carbohydrates.   Overall good result. No noted cause of her new onset atrial flutter.

## 2019-06-11 ENCOUNTER — Telehealth: Payer: Self-pay | Admitting: *Deleted

## 2019-06-11 NOTE — Telephone Encounter (Signed)
No answer. Left message to call back.   

## 2019-06-11 NOTE — Telephone Encounter (Signed)
-----   Message from Loel Dubonnet, NP sent at 06/11/2019  9:26 AM EST ----- Regarding: Eliquis dose I saw Adriana Miller on 06/06/19. She was noted to be taking Eliquis 2.5mg  BID. This was resumed by Dr. Humphrey Rolls at her primary care office on 05/08/19. It was previously held due to GI bleed in October.    After review by Dr. Saunders Revel and myself she does not need to be on reduced dose of Eliquis. She does need to be on Eliquis to protect her from stroke due to her history of atrial fibrillation/flutter. Please call her and have her change to 5mg  BID. She has 2.5mg  tablets that she may use up, if she wants, by taking two 2.5mg  tablets twice daily.  She has hx of some confusion regarding medications so she may simply need the 5mg  tablet. She likely will need careful teaching and some time to understand.   Let me know if you have questions!  Best, Loel Dubonnet, NP

## 2019-06-12 ENCOUNTER — Inpatient Hospital Stay: Payer: Medicare Other | Admitting: Oncology

## 2019-06-12 MED ORDER — APIXABAN 5 MG PO TABS: 5.0000 mg | ORAL_TABLET | Freq: Two times a day (BID) | ORAL | 2 refills | Status: DC

## 2019-06-12 NOTE — Telephone Encounter (Signed)
I spoke with the patient. She is aware of Dr. Marlane Mingle, NP's recommendations to increase eliquis to 5 mg BID based on age/ weight/ serum creatinine.  The patient confirmed with me that she has eliquis 2.5 mg tablets.  I have advised her she may take 2- 2.5 mg tablets (5 mg) BID until she uses these up and that I will also update her pharmacy of the dose increase.  The patient voices understanding that she will start taking 2- 2.5 mg tablets of eliquis BID starting tonight.

## 2019-06-12 NOTE — Telephone Encounter (Signed)
Attempted to call the patient. The phone rang and someone picked up but the call was lost. I attempted to call back- no answer- I left a message to please call back on the patient's identified voice mail.

## 2019-06-14 ENCOUNTER — Telehealth: Payer: Self-pay

## 2019-06-14 NOTE — Telephone Encounter (Signed)
Confirmed appointment on 06/19/2019 and screened for covid. klh 

## 2019-06-19 ENCOUNTER — Ambulatory Visit (INDEPENDENT_AMBULATORY_CARE_PROVIDER_SITE_OTHER): Payer: Medicare Other | Admitting: Adult Health

## 2019-06-19 ENCOUNTER — Other Ambulatory Visit: Payer: Self-pay

## 2019-06-19 ENCOUNTER — Encounter: Payer: Self-pay | Admitting: Adult Health

## 2019-06-19 VITALS — BP 125/66 | HR 63 | Temp 97.6°F | Resp 16 | Ht 66.0 in | Wt 162.0 lb

## 2019-06-19 DIAGNOSIS — F321 Major depressive disorder, single episode, moderate: Secondary | ICD-10-CM | POA: Diagnosis not present

## 2019-06-19 DIAGNOSIS — I4891 Unspecified atrial fibrillation: Secondary | ICD-10-CM

## 2019-06-19 DIAGNOSIS — Z7901 Long term (current) use of anticoagulants: Secondary | ICD-10-CM | POA: Diagnosis not present

## 2019-06-19 DIAGNOSIS — K7031 Alcoholic cirrhosis of liver with ascites: Secondary | ICD-10-CM | POA: Diagnosis not present

## 2019-06-19 DIAGNOSIS — I10 Essential (primary) hypertension: Secondary | ICD-10-CM | POA: Diagnosis not present

## 2019-06-19 MED ORDER — SPIRONOLACTONE 25 MG PO TABS: 25.0000 mg | ORAL_TABLET | Freq: Every day | ORAL | 2 refills | Status: DC

## 2019-06-19 NOTE — Progress Notes (Signed)
[facility] [place], [place] [address]  Internal MEDICINE  Office Visit Note  Patient Name: Adriana Miller  [id number]  [id number]  Date of Service: 06/19/2019  Chief Complaint  Patient presents with  . Follow-up  . Diabetes  . Hyperlipidemia  . Hypertension  . Medication Refill    spirnolactone    HPI  Pt is here for follow up on DM, HTN, and HLD.  She is in need of a refill on her spirolactone.  Overall she is doing well. She has a history of Alcoholic cirrhosis also.  She has seen GI and and had colonoscopy.  She denies any blood stool or vomit.  Her depression is controlled, she is pleasant and cooperative in the exam room. BP is good today, Denies Chest pain, Shortness of breath, palpitations, headache, or blurred vision.         Current Medication: Outpatient Encounter Medications as of 06/19/2019  Medication Sig  . amLODipine (NORVASC) 5 MG tablet Take 5 mg by mouth daily.  Marland Kitchen apixaban (ELIQUIS) 5 MG TABS tablet Take 1 tablet (5 mg total) by mouth 2 (two) times daily.  Marland Kitchen diltiazem (CARDIZEM CD) 120 MG 24 hr capsule Take 1 capsule (120 mg total) by mouth daily.  Marland Kitchen escitalopram (LEXAPRO) 10 MG tablet Take 1 tablet (10 mg total) by mouth daily. For anxiety/sleep  . furosemide (LASIX) 20 MG tablet Take 2 tablets (40 mg) by mouth once a day until swelling and weight back to baseline, then go back to 20 mg by mouth daily.  . metoprolol tartrate (LOPRESSOR) 25 MG tablet Take 0.5 tablets (12.5 mg total) by mouth 2 (two) times daily.  . mirtazapine (REMERON) 7.5 MG tablet Take one tab at night for sleep  . spironolactone (ALDACTONE) 25 MG tablet Take 1 tablet (25 mg total) by mouth daily. For cirrhosis of liver  . [DISCONTINUED] potassium chloride SA (KLOR-CON) 20 MEQ tablet Take 2 tablets (40 mEq total) by mouth daily.  . [DISCONTINUED] spironolactone (ALDACTONE) 25 MG tablet Take 1 tablet (25 mg total) by mouth daily. For cirrhosis of liver   No  facility-administered encounter medications on file as of 06/19/2019.    Surgical History: Past Surgical History:  Procedure Laterality Date  . CEREBRAL ANEURYSM REPAIR  1991  . COLONOSCOPY WITH PROPOFOL N/A 02/12/2019   Procedure: COLONOSCOPY WITH PROPOFOL;  Surgeon: Lin Landsman, MD;  Location[facility] ENDOSCOPY;  Service: Gastroenterology;  Laterality: N/A;  . ESOPHAGOGASTRODUODENOSCOPY (EGD) WITH PROPOFOL N/A 02/12/2019   Procedure: ESOPHAGOGASTRODUODENOSCOPY (EGD) WITH PROPOFOL;  Surgeon: Lin Landsman, MD;  Location: [facility] ENDOSCOPY;  Service: Gastroenterology;  Laterality: N/A;    Medical History: Past Medical History:  Diagnosis Date  . B12 deficiency 04/05/2017  . Blood in stool   . Cerebral aneurysm   . Chronic kidney disease   . Diabetes mellitus without complication ([place])   . Gastric erosion with bleeding   . Hyperlipidemia   . Hypertension   . Lower extremity cellulitis 02/08/2019  . Seizure disorder [facility])     Family History: Family History  Problem Relation Age of Onset  . Heart attack Brother 16    Social History   Socioeconomic History  . Marital status: Married    Spouse name: Not on file  . Number of children: Not on file  . Years of education: Not on file  . Highest education level: Not on file  Occupational History  . Not on file  Tobacco Use  . Smoking  status: Never Smoker  . Smokeless tobacco: Current User    Types: Snuff  Substance and Sexual Activity  . Alcohol use: Not Currently    Alcohol/week: 14.0 standard drinks    Types: 14 Cans of beer per week    Comment: pt has cut back on alcohol use/stated not currently drinking  . Drug use: No  . Sexual activity: Not Currently  Other Topics Concern  . Not on file  Social History Narrative  . Not on file   Social Determinants of Health   Financial Resource Strain:   . Difficulty of Paying Living Expenses: Not on file  Food Insecurity:   . Worried About Charity fundraiser in  the Last Year: Not on file  . Ran Out of Food in the Last Year: Not on file  Transportation Needs:   . Lack of Transportation (Medical): Not on file  . Lack of Transportation (Non-Medical): Not on file  Physical Activity:   . Days of Exercise per Week: Not on file  . Minutes of Exercise per Session: Not on file  Stress:   . Feeling of Stress : Not on file  Social Connections:   . Frequency of Communication with Friends and Family: Not on file  . Frequency of Social Gatherings with Friends and Family: Not on file  . Attends Religious Services: Not on file  . Active Member of Clubs or Organizations: Not on file  . Attends Archivist Meetings: Not on file  . Marital Status: Not on file  Intimate Partner Violence:   . Fear of Current or Ex-Partner: Not on file  . Emotionally Abused: Not on file  . Physically Abused: Not on file  . Sexually Abused: Not on file      Review of Systems  Constitutional: Negative for chills, fatigue and unexpected weight change.  HENT: Negative for congestion, rhinorrhea, sneezing and sore throat.   Eyes: Negative for photophobia, pain and redness.  Respiratory: Negative for cough, chest tightness and shortness of breath.   Cardiovascular: Negative for chest pain and palpitations.  Gastrointestinal: Negative for abdominal pain, constipation, diarrhea, nausea and vomiting.  Endocrine: Negative.   Genitourinary: Negative for dysuria and frequency.  Musculoskeletal: Negative for arthralgias, back pain, joint swelling and neck pain.  Skin: Negative for rash.  Allergic/Immunologic: Negative.   Neurological: Negative for tremors and numbness.  Hematological: Negative for adenopathy. Does not bruise/bleed easily.  Psychiatric/Behavioral: Negative for behavioral problems and sleep disturbance. The patient is not nervous/anxious.     Vital Signs: BP 125/66   Pulse 63   Temp 97.6 F (36.4 C)   Resp 16   Ht 5\' 6"  (1.676 m)   Wt 162 lb (73.5 kg)    SpO2 100%   BMI 26.15 kg/m    Physical Exam Vitals and nursing note reviewed.  Constitutional:      General: She is not in acute distress.    Appearance: She is well-developed. She is not diaphoretic.  HENT:     Head: Normocephalic and atraumatic.     Mouth/Throat:     Pharynx: No oropharyngeal exudate.  Eyes:     Pupils: Pupils are equal, round, and reactive to light.  Neck:     Thyroid: No thyromegaly.     Vascular: No JVD.     Trachea: No tracheal deviation.  Cardiovascular:     Rate and Rhythm: Normal rate and regular rhythm.     Heart sounds: Normal heart sounds. No murmur. No friction  rub. No gallop.   Pulmonary:     Effort: Pulmonary effort is normal. No respiratory distress.     Breath sounds: Normal breath sounds. No wheezing or rales.  Chest:     Chest wall: No tenderness.  Abdominal:     Palpations: Abdomen is soft.     Tenderness: There is no abdominal tenderness. There is no guarding.  Musculoskeletal:        General: Normal range of motion.     Cervical back: Normal range of motion and neck supple.  Lymphadenopathy:     Cervical: No cervical adenopathy.  Skin:    General: Skin is warm and dry.  Neurological:     Mental Status: She is alert and oriented to person, place, and time.     Cranial Nerves: No cranial nerve deficit.  Psychiatric:        Behavior: Behavior normal.        Thought Content: Thought content normal.        Judgment: Judgment normal.    Assessment/Plan: 1. Essential hypertension BP well controlled, continue to monitor.   2. Alcoholic cirrhosis of liver with ascites (HCC) Refilled Aldactone at this time.  - spironolactone (ALDACTONE) 25 MG tablet; Take 1 tablet (25 mg total) by mouth daily. For cirrhosis of liver  Dispense: 90 tablet; Refill: 2  3. Chronic anticoagulation Continue eliquis.   4. Depression, major, single episode, moderate (HCC) Good symptom control.  Continue lexapro.  5. Atrial fibrillation, unspecified  type ([place]) On anticoagulation, continue eliquis as directed.   General Counseling: billyjo mccuskey understanding of the findings of todays visit and agrees with plan of treatment. I have discussed any further diagnostic evaluation that may be needed or ordered today. We also reviewed her medications today. she has been encouraged to call the office with any questions or concerns that should arise related to todays visit.    No orders of the defined types were placed in this encounter.   Meds ordered this encounter  Medications  . spironolactone (ALDACTONE) 25 MG tablet    Sig: Take 1 tablet (25 mg total) by mouth daily. For cirrhosis of liver    Dispense:  90 tablet    Refill:  2    Time spent: 25 Minutes   This patient was seen by Orson Gear AGNP-C in Collaboration with Dr Lavera Guise as a part of collaborative care agreement     Kendell Bane AGNP-C Internal medicine

## 2019-06-20 NOTE — Progress Notes (Signed)
Office Visit    Patient Name: Adriana Miller Date of Encounter: 06/22/2019  Primary Care Provider:  Ronnell Freshwater, NP Primary Cardiologist:  Nelva Bush, MD  Chief Complaint    64 year old female with history of persistent atrial fibrillation, new atrial flutter as seen at 06/06/19 visit, hypertension, hyperlipidemia, CKD, alcoholic cirrhosis complicated by GI bleed [id number], seizure disorder, portal hypertension, anemia, cerebral aneurysm s/p repair, and who presents today for 2 week clinic follow-up.  Past Medical History    Past Medical History:  Diagnosis Date  . B12 deficiency 04/05/2017  . Blood in stool   . Cerebral aneurysm   . Chronic kidney disease   . Diabetes mellitus without complication ([place])   . Gastric erosion with bleeding   . Hyperlipidemia   . Hypertension   . Lower extremity cellulitis 02/08/2019  . Seizure disorder [facility])    Past Surgical History:  Procedure Laterality Date  . CEREBRAL ANEURYSM REPAIR  1991  . COLONOSCOPY WITH PROPOFOL N/A 02/12/2019   Procedure: COLONOSCOPY WITH PROPOFOL;  Surgeon: Lin Landsman, MD;  Location: [facility] ENDOSCOPY;  Service: Gastroenterology;  Laterality: N/A;  . ESOPHAGOGASTRODUODENOSCOPY (EGD) WITH PROPOFOL N/A 02/12/2019   Procedure: ESOPHAGOGASTRODUODENOSCOPY (EGD) WITH PROPOFOL;  Surgeon: Lin Landsman, MD;  Location: [facility] ENDOSCOPY;  Service: Gastroenterology;  Laterality: N/A;    Allergies  Allergies  Allergen Reactions  . Aspirin Other (See Comments)    Nose bleed    History of Present Illness    64 year old female with PMH as above.  She reportedly had a telephone encounter 05/30/2019 with weight gain and lower extremity edema.  Recommendation was to increase her Lasix to 40 mg daily for a few days and until lower extremity swelling resolved.  She was seen by GI 05/31/2019 with recommendation for referral to hematology given elevated total protein and low albumin.  When seen in clinic  06/06/2019, she had recently taken extra Lasix for 3 to 4 days and then dropped back down to Lasix 20 mg.  With this increase, her shortness of breath and lower extremity edema had resolved.  She was not checking her blood pressure at home with BP low/normal in clinic.  She was not adding salt to her food but did admit to precooked foods.  Leg elevation was encouraged.  EKG showed atrial flutter with ventricular rate 119 bpm.  She was asymptomatic.  GI had approved restart of [place] after 01/2019 GIB and she was continued on Eliquis 2.5mg  BID. It was noted that it was unclear the reason she was on a reduced dose. She was unable to confirm if she had missed a dose of Eliquis in the last few weeks but denied any signs or symptoms of bleeding.  It was suspected that her atrial flutter was triggered by her recent volume overload.  Her beta-blocker was reportedly stopped 02/2019 by her PCP for bradycardia.  In clinic, she was started on metoprolol tartrate at 12.5 mg twice daily with recommendation to follow-up in 2 weeks.  If still in atrial fib/flutter at that time, it was recommended she be scheduled for cardioversion.  She was continued on diltiazem 120 mg daily.  Recommendation was to monitor closely for bradycardia, and if noted, consider ZIO monitor.  Labs were ordered to reassess etiology of atrial flutter.  On RTC today, she is back in NSR. BP is soft to hypotensive and HR improved from previous visit. She denies any CP, palpitations, or racing HR. No SOB/DOE. No presyncope or syncope.  No bilateral LEE, abdominal distention, PND, orthopnea, or early satiety. She denies any s/sx of bleeding. She reports that she is overall doing very well. She does report that she uses snuff and states she likely cannot quit but agreeable to cut back. Discussed diet and exercise. She does not own a cuff but will get one with goal BP discussed today and provided on AVS.    Home Medications    Prior to Admission medications     Medication Sig Start Date End Date Taking? Authorizing Provider  amLODipine (NORVASC) 5 MG tablet Take 5 mg by mouth daily. 05/11/19   [provider]  apixaban (ELIQUIS) 5 MG TABS tablet Take 1 tablet (5 mg total) by mouth 2 (two) times daily. 06/12/19   Loel Dubonnet, NP  diltiazem (CARDIZEM CD) 120 MG 24 hr capsule Take 1 capsule (120 mg total) by mouth daily. 02/13/19 06/05/28  Mayo, Pete Pelt, MD  escitalopram (LEXAPRO) 10 MG tablet Take 1 tablet (10 mg total) by mouth daily. For anxiety/sleep 03/20/19   Lavera Guise, MD  furosemide (LASIX) 20 MG tablet Take 2 tablets (40 mg) by mouth once a day until swelling and weight back to baseline, then go back to 20 mg by mouth daily. 05/30/19   End, Harrell Gave, MD  metoprolol tartrate (LOPRESSOR) 25 MG tablet Take 0.5 tablets (12.5 mg total) by mouth 2 (two) times daily. 06/07/19   Loel Dubonnet, NP  mirtazapine (REMERON) 7.5 MG tablet Take one tab at night for sleep 05/08/19   Lavera Guise, MD  spironolactone (ALDACTONE) 25 MG tablet Take 1 tablet (25 mg total) by mouth daily. For cirrhosis of liver 06/19/19   Kendell Bane, NP    Review of Systems    She denies chest pain, palpitations, dyspnea, pnd, orthopnea, n, v, dizziness, syncope, edema, weight gain, or early satiety.   All other systems reviewed and are otherwise negative except as noted above.  Physical Exam    VS:  BP 102/60 (BP Location: Right Arm, Patient Position: Sitting, Cuff Size: Normal)   Pulse 68   Ht 5\' 6"  (1.676 m)   Wt 167 lb (75.8 kg)   SpO2 95%   BMI 26.95 kg/m  , BMI Body mass index is 26.95 kg/m. GEN: Frail female, in no acute distress. HEENT: normal. Neck: Supple, no JVD, carotid bruits, or masses. Cardiac: RRR, no murmurs, rubs, or gallops. No clubbing, cyanosis, edema.  Radials/DP/PT 2+ and equal bilaterally.  Respiratory:  Respirations regular and unlabored, clear to auscultation bilaterally. GI: Soft, nontender, nondistended, BS + x 4. MS:  no deformity or atrophy. Skin: warm and dry, no rash. Neuro:  Strength and sensation are intact. Psych: Normal affect.  Accessory Clinical Findings    ECG personally reviewed by me today - NSR, 68bpm, atrial flutter no longer present from 06/06/19, nonspecific changes/TWI, PRi 182 - no acute changes.  VITALS Reviewed   Temp Readings from Last 3 Encounters:  06/19/19 97.6 F (36.4 C)  05/31/19 98.6 F (37 C) (Oral)  05/08/19 (!) 96.5 F (35.8 C)   BP Readings from Last 3 Encounters:  06/22/19 102/60  06/19/19 125/66  06/06/19 110/62   Pulse Readings from Last 3 Encounters:  06/22/19 68  06/19/19 63  06/06/19 (!) 119    Wt Readings from Last 3 Encounters:  06/22/19 167 lb (75.8 kg)  06/19/19 162 lb (73.5 kg)  06/06/19 158 lb (71.7 kg)     LABS  reviewed   CareEverwhere  Labs present? Yes/No: No  Lab Results  Component Value Date   WBC CANCELED 06/06/2019   HGB CANCELED 06/06/2019   HCT CANCELED 06/06/2019   MCV 95.8 03/28/2019   PLT CANCELED 06/06/2019   Lab Results  Component Value Date   CREATININE 1.24 (H) 06/06/2019   BUN 24 06/06/2019   NA 140 06/06/2019   K 4.7 06/06/2019   CL 102 06/06/2019   CO2 21 06/06/2019   Lab Results  Component Value Date   ALT 16 03/28/2019   AST 42 (H) 03/28/2019   ALKPHOS 56 03/28/2019   BILITOT 2.4 (H) 03/28/2019   Lab Results  Component Value Date   CHOL 134 01/27/2018   HDL 80 01/27/2018   LDLCALC 42 01/27/2018   TRIG 60 01/27/2018    Lab Results  Component Value Date   HGBA1C 5.3 10/09/2018   Lab Results  Component Value Date   TSH 3.040 06/06/2019     STUDIES/PROCEDURES reviewed    Echo 01/15/2019 1. Left ventricular ejection fraction, by visual estimation, is 55 to  60%. The left ventricle has normal function. Normal left ventricular size.  There is no left ventricular hypertrophy.  2. Global right ventricle has normal systolic function.The right  ventricular size is normal. No increase in  right ventricular wall  thickness.  3. Left atrial size was normal.  4. Right atrial size was normal.  5. The mitral valve is normal in structure. No evidence of mitral valve  regurgitation. No evidence of mitral stenosis.  6. The tricuspid valve is normal in structure. Tricuspid valve  regurgitation is trivial.  7. The aortic valve is normal in structure. Aortic valve regurgitation  was not visualized by color flow Doppler. Structurally normal aortic  valve, with no evidence of sclerosis or stenosis.  8. The pulmonic valve was normal in structure. Pulmonic valve  regurgitation is trivial by color flow Doppler.  9. TR signal is inadequate for assessing pulmonary artery systolic  pressure.  10. The inferior vena cava is normal in size with greater than 50%  respiratory variability, suggesting right atrial pressure of 3 mmHg.   Assessment & Plan    Paroxysmal atrial fibrillation/flutter --NSR today. She is asx in AT and denies any sx today. Tolerating BB well at lopressor 12.5mg  BID, in addition to her diltiazem 120mg  daily. No bradycardia today and BP stable. Continue current and recently increased dose [place]. She was previously on reduced dose Eliquis 2.5mg  BID, now increased to Eliquis 5mg  BID. Given this increased dose [place] with history of GIB, will update to CBC. CBC collected at last clinic visit with reported lab error in collection and not yet repeated. No s/sx of bleeding reported today. She will obtain a BP cuff via her insurance, which will also enable her to start to monitor her HR at home.   Long term [place] --History of GIB 01/2019. Has since resumed [place] and recently was increased from Eliquis 2.5mg  BID to 5mg  BID as directly above. Will reorder CBC as CBC collected at last visit was not resulted 2/2 lab error.  Chronic HFpEF --Euvolemic on exam. Denies any s/sx suggestive of worsening HF. Previous echo as above with nl EF. Continue current Lasix 20mg  and Spironolactone 25mg   daily with renal function and electrolytes checked at last visit and listed above. She occasionally increases her lasix from 20mg  daily to 40mg  daily for increased LEE with recommendation to contact our office first before increasing, and as we may recommend for a repeat BMET  if she does this for multiple days in a row and given her known CKD. Recommended she obtain a BP cuff and monitor daily weights to help with home monitoring of her volume status. She is in agreement and will reach out to her insurance company for a cuff.  Continue to monitor sodium intake.   Cirrhosis --Continue to follow with GI/PCP.  HTN --BP 102/60 and well controlled today. She denies any presyncope or syncope with this soft pressure. Continue to monitor. As previously noted, her Enalapril was discontinued by her primary cardiologist 2/2 relative hypotension. She is no longer taking this medication.  CKDIII --Continue to monitor. Most recent BMET as above. She reports occasionally taking an extra lasix for LEE and cautioned her against doing this multiple days in a row without first contacting the office as she may need a recheck of her BMET/Mg. She will start daily home weights and BP monitoring to assist with self monitoring of her volume status.   Tobacco use --She reports that she has used "snuff" or tobacco and has since she was a child. Long discussion regarding benefits of cessation. She will attempt to cut back on this use with cessation encouraged.   Medication changes: None Labs ordered: CBC  Studies / Imaging ordered: None Disposition: RTC 1 month  Total time spent with patient today 45 minutes. This includes reviewing records, evaluating the patient, and coordinating care. Face-to-face time >50%.    Arvil Chaco, PA-C 06/22/2019

## 2019-06-21 ENCOUNTER — Telehealth: Payer: Self-pay

## 2019-06-21 NOTE — Telephone Encounter (Signed)
PLAN OF CARE SIGNED AND PLACED IN [facility].

## 2019-06-22 ENCOUNTER — Ambulatory Visit (INDEPENDENT_AMBULATORY_CARE_PROVIDER_SITE_OTHER): Payer: Medicare Other | Admitting: Physician Assistant

## 2019-06-22 ENCOUNTER — Other Ambulatory Visit: Payer: Self-pay

## 2019-06-22 ENCOUNTER — Encounter: Payer: Self-pay | Admitting: Physician Assistant

## 2019-06-22 VITALS — BP 102/60 | HR 68 | Ht 66.0 in | Wt 167.0 lb

## 2019-06-22 DIAGNOSIS — I4819 Other persistent atrial fibrillation: Secondary | ICD-10-CM | POA: Diagnosis not present

## 2019-06-22 DIAGNOSIS — Z7901 Long term (current) use of anticoagulants: Secondary | ICD-10-CM

## 2019-06-22 DIAGNOSIS — I10 Essential (primary) hypertension: Secondary | ICD-10-CM

## 2019-06-22 DIAGNOSIS — E785 Hyperlipidemia, unspecified: Secondary | ICD-10-CM

## 2019-06-22 DIAGNOSIS — I4892 Unspecified atrial flutter: Secondary | ICD-10-CM | POA: Diagnosis not present

## 2019-06-22 DIAGNOSIS — D649 Anemia, unspecified: Secondary | ICD-10-CM

## 2019-06-22 DIAGNOSIS — K7030 Alcoholic cirrhosis of liver without ascites: Secondary | ICD-10-CM

## 2019-06-22 DIAGNOSIS — Z79899 Other long term (current) drug therapy: Secondary | ICD-10-CM

## 2019-06-22 DIAGNOSIS — Z8719 Personal history of other diseases of the digestive system: Secondary | ICD-10-CM

## 2019-06-22 DIAGNOSIS — Z87898 Personal history of other specified conditions: Secondary | ICD-10-CM

## 2019-06-22 NOTE — Patient Instructions (Addendum)
Medication Instructions:  - Your physician recommends that you continue on your current medications as directed. Please refer to the Current Medication list given to you today.  *If you need a refill on your cardiac medications before your next appointment, please call your pharmacy*   Lab Work: - Your physician recommends that you have lab work today: CBC  If you have labs (blood work) drawn today and your tests are completely normal, you will receive your results only by: Marland Kitchen MyChart Message (if you have MyChart) OR . A paper copy in the mail If you have any lab test that is abnormal or we need to change your treatment, we will call you to review the results.   Testing/Procedures: - none ordered   Follow-Up: At [facility], you and your health needs are our priority.  As part of our continuing mission to provide you with exceptional heart care, we have created designated Provider Care Teams.  These Care Teams include your primary Cardiologist (physician) and Advanced Practice Providers (APPs -  Physician Assistants and Nurse Practitioners) who all work together to provide you with the care you need, when you need it.  We recommend signing up for the patient portal called "MyChart".  Sign up information is provided on this After Visit Summary.  MyChart is used to connect with patients for Virtual Visits (Telemedicine).  Patients are able to view lab/test results, encounter notes, upcoming appointments, etc.  Non-urgent messages can be sent to your provider as well.   To learn more about what you can do with MyChart, go to [email or web address].    Your next appointment:   1 month(s)  The format for your next appointment:   In Person  Provider:    You may see Nelva Bush, MD or one of the following Advanced Practice Providers on your designated Care Team:    Murray Hodgkins, NP  Christell Faith, PA-C  Marrianne Mood, PA-C    Other Instructions  - The goal for you  blood pressure is < 130/80

## 2019-06-23 LAB — CBC WITH DIFFERENTIAL/PLATELET
Basophils Absolute: 0 10*3/uL (ref 0.0–0.2)
Basos: 0 %
EOS (ABSOLUTE): 0.1 10*3/uL (ref 0.0–0.4)
Eos: 1 %
Hematocrit: 36.8 % (ref 34.0–46.6)
Hemoglobin: 12.8 g/dL (ref 11.1–15.9)
Immature Grans (Abs): 0 10*3/uL (ref 0.0–0.1)
Immature Granulocytes: 0 %
Lymphocytes Absolute: 1.4 10*3/uL (ref 0.7–3.1)
Lymphs: 26 %
MCH: 31.9 pg (ref 26.6–33.0)
MCHC: 34.8 g/dL (ref 31.5–35.7)
MCV: 92 fL (ref 79–97)
Monocytes Absolute: 0.4 10*3/uL (ref 0.1–0.9)
Monocytes: 8 %
Neutrophils Absolute: 3.6 10*3/uL (ref 1.4–7.0)
Neutrophils: 65 %
Platelets: 173 10*3/uL (ref 150–450)
RBC: 4.01 x10E6/uL (ref 3.77–5.28)
RDW: 11.4 % — ABNORMAL LOW (ref 11.7–15.4)
WBC: 5.5 10*3/uL (ref 3.4–10.8)

## 2019-06-26 ENCOUNTER — Encounter: Payer: Self-pay | Admitting: Oncology

## 2019-06-26 NOTE — Progress Notes (Signed)
Patient contacted for new pt visit. Pt aware of referral and has [facility] transportation set up. Chart reviewed and updated. Pt reports that she quit drinking when she went to the hospital in 01/2019.

## 2019-06-27 ENCOUNTER — Inpatient Hospital Stay: Payer: Medicare Other

## 2019-06-27 ENCOUNTER — Other Ambulatory Visit: Payer: Self-pay

## 2019-06-27 ENCOUNTER — Encounter: Payer: Self-pay | Admitting: Oncology

## 2019-06-27 ENCOUNTER — Inpatient Hospital Stay: Payer: Medicare Other | Attending: Oncology | Admitting: Oncology

## 2019-06-27 VITALS — BP 123/77 | HR 82 | Temp 97.5°F | Resp 16 | Wt 162.5 lb

## 2019-06-27 DIAGNOSIS — E538 Deficiency of other specified B group vitamins: Secondary | ICD-10-CM

## 2019-06-27 DIAGNOSIS — I4891 Unspecified atrial fibrillation: Secondary | ICD-10-CM | POA: Insufficient documentation

## 2019-06-27 DIAGNOSIS — E8809 Other disorders of plasma-protein metabolism, not elsewhere classified: Secondary | ICD-10-CM | POA: Insufficient documentation

## 2019-06-27 DIAGNOSIS — K7030 Alcoholic cirrhosis of liver without ascites: Secondary | ICD-10-CM | POA: Insufficient documentation

## 2019-06-27 DIAGNOSIS — I10 Essential (primary) hypertension: Secondary | ICD-10-CM | POA: Diagnosis not present

## 2019-06-27 DIAGNOSIS — M79644 Pain in right finger(s): Secondary | ICD-10-CM | POA: Insufficient documentation

## 2019-06-27 DIAGNOSIS — R531 Weakness: Secondary | ICD-10-CM | POA: Diagnosis not present

## 2019-06-27 DIAGNOSIS — Z8249 Family history of ischemic heart disease and other diseases of the circulatory system: Secondary | ICD-10-CM | POA: Diagnosis not present

## 2019-06-27 DIAGNOSIS — R5383 Other fatigue: Secondary | ICD-10-CM | POA: Insufficient documentation

## 2019-06-27 DIAGNOSIS — E119 Type 2 diabetes mellitus without complications: Secondary | ICD-10-CM | POA: Insufficient documentation

## 2019-06-27 DIAGNOSIS — M109 Gout, unspecified: Secondary | ICD-10-CM

## 2019-06-27 DIAGNOSIS — Z7901 Long term (current) use of anticoagulants: Secondary | ICD-10-CM | POA: Insufficient documentation

## 2019-06-27 DIAGNOSIS — Z79899 Other long term (current) drug therapy: Secondary | ICD-10-CM

## 2019-06-27 DIAGNOSIS — G40909 Epilepsy, unspecified, not intractable, without status epilepticus: Secondary | ICD-10-CM | POA: Insufficient documentation

## 2019-06-27 DIAGNOSIS — K766 Portal hypertension: Secondary | ICD-10-CM | POA: Diagnosis not present

## 2019-06-27 DIAGNOSIS — E785 Hyperlipidemia, unspecified: Secondary | ICD-10-CM | POA: Diagnosis not present

## 2019-06-27 DIAGNOSIS — R779 Abnormality of plasma protein, unspecified: Secondary | ICD-10-CM

## 2019-06-27 DIAGNOSIS — N189 Chronic kidney disease, unspecified: Secondary | ICD-10-CM | POA: Diagnosis not present

## 2019-06-27 DIAGNOSIS — E531 Pyridoxine deficiency: Secondary | ICD-10-CM

## 2019-06-27 LAB — COMPREHENSIVE METABOLIC PANEL
ALT: 16 U/L (ref 0–44)
AST: 24 U/L (ref 15–41)
Albumin: 3.1 g/dL — ABNORMAL LOW (ref 3.5–5.0)
Alkaline Phosphatase: 85 U/L (ref 38–126)
Anion gap: 10 (ref 5–15)
BUN: 24 mg/dL — ABNORMAL HIGH (ref 8–23)
CO2: 24 mmol/L (ref 22–32)
Calcium: 9.2 mg/dL (ref 8.9–10.3)
Chloride: 100 mmol/L (ref 98–111)
Creatinine, Ser: 1.12 mg/dL — ABNORMAL HIGH (ref 0.44–1.00)
GFR calc Af Amer: 60 mL/min — ABNORMAL LOW (ref >60)
GFR calc non Af Amer: 52 mL/min — ABNORMAL LOW (ref >60)
Glucose, Bld: 318 mg/dL — ABNORMAL HIGH (ref 70–99)
Potassium: 4.3 mmol/L (ref 3.5–5.1)
Sodium: 134 mmol/L — ABNORMAL LOW (ref 135–145)
Total Bilirubin: 0.8 mg/dL (ref 0.3–1.2)
Total Protein: 8.7 g/dL — ABNORMAL HIGH (ref 6.5–8.1)

## 2019-06-27 LAB — CBC WITH DIFFERENTIAL/PLATELET
Abs Immature Granulocytes: 0.01 10*3/uL (ref 0.00–0.07)
Basophils Absolute: 0 10*3/uL (ref 0.0–0.1)
Basophils Relative: 1 %
Eosinophils Absolute: 0 10*3/uL (ref 0.0–0.5)
Eosinophils Relative: 1 %
HCT: 36.7 % (ref 36.0–46.0)
Hemoglobin: 11.7 g/dL — ABNORMAL LOW (ref 12.0–15.0)
Immature Granulocytes: 0 %
Lymphocytes Relative: 35 %
Lymphs Abs: 1.8 10*3/uL (ref 0.7–4.0)
MCH: 30.3 pg (ref 26.0–34.0)
MCHC: 31.9 g/dL (ref 30.0–36.0)
MCV: 95.1 fL (ref 80.0–100.0)
Monocytes Absolute: 0.5 10*3/uL (ref 0.1–1.0)
Monocytes Relative: 9 %
Neutro Abs: 2.9 10*3/uL (ref 1.7–7.7)
Neutrophils Relative %: 54 %
Platelets: 132 10*3/uL — ABNORMAL LOW (ref 150–400)
RBC: 3.86 MIL/uL — ABNORMAL LOW (ref 3.87–5.11)
RDW: 12.1 % (ref 11.5–15.5)
WBC: 5.2 10*3/uL (ref 4.0–10.5)
nRBC: 0 % (ref 0.0–0.2)

## 2019-06-27 LAB — TECHNOLOGIST SMEAR REVIEW: Plt Morphology: NORMAL

## 2019-06-27 LAB — URIC ACID: Uric Acid, Serum: 8.6 mg/dL — ABNORMAL HIGH (ref 2.5–7.1)

## 2019-06-27 MED ORDER — ALLOPURINOL 100 MG PO TABS: 100.0000 mg | ORAL_TABLET | Freq: Every day | ORAL | 0 refills | Status: DC

## 2019-06-27 NOTE — Progress Notes (Signed)
Hematology/Oncology Consult note [facility] Telephone:([phone] [phone]   Patient Care Team: Ronnell Freshwater, NP as PCP - General (Family Medicine) End, Harrell Gave, MD as PCP - Cardiology (Cardiology)  REFERRING PROVIDER: Lin Landsman, MD  CHIEF COMPLAINTS/REASON FOR VISIT:  Evaluation of elevated protein  HISTORY OF PRESENTING ILLNESS:   Adriana Miller is a  64 y.o.  female with PMH listed below was seen in consultation at the request of  Lin Landsman, MD  for evaluation of elevated protein Patient has a history of alcoholic cirrhosis.  Recent admission due to bloody diarrhea and anemia due to acute blood loss.  She underwent EGD and colonoscopy.  EGD showed gastric bleeding erosion which was cauterized.  Evidence of portal hypertensive gastropathy.  Colonoscopy showed 2 small polyps that were removed. She has stopped drinking alcohol since discharge. 05/09/2019, cirrhotic liver morphology without focal mass lesion evident.  Trace ascites.  Cholelithiasis.  Tiny hypodensities in the pancreatic tail too small to characterize and potentially cystic.  Follow-up CT 3 to 6 months to ensure stability.  Abdominal MRI could be performed if patient is able to hold breath. Her blood work on 03/28/2019 showed protein level of 9.3, total bilirubin 2.4, Patient was referred to me for evaluation of elevated protein level. Patient reports feeling fatigued and weak.  Appetite is poor. She has multiple joint pain.  She reports swelling of right index finger.  Review of Systems  Constitutional: Positive for appetite change, fatigue and unexpected weight change. Negative for chills and fever.  HENT:   Negative for hearing loss and voice change.   Eyes: Negative for eye problems.  Respiratory: Negative for chest tightness and cough.   Cardiovascular: Negative for chest pain.  Gastrointestinal: Negative for abdominal distention, abdominal pain and  blood in stool.  Endocrine: Negative for hot flashes.  Genitourinary: Negative for difficulty urinating and frequency.   Musculoskeletal: Positive for arthralgias.  Skin: Negative for itching and rash.  Neurological: Negative for extremity weakness.  Hematological: Negative for adenopathy. Bruises/bleeds easily.  Psychiatric/Behavioral: Negative for confusion.    MEDICAL HISTORY:  Past Medical History:  Diagnosis Date  . B12 deficiency 04/05/2017  . Blood in stool   . Cerebral aneurysm   . Chronic kidney disease   . Diabetes mellitus without complication ([place])   . Gastric erosion with bleeding   . Hyperlipidemia   . Hypertension   . Lower extremity cellulitis 02/08/2019  . Seizure disorder [facility])     SURGICAL HISTORY: Past Surgical History:  Procedure Laterality Date  . CEREBRAL ANEURYSM REPAIR  1991  . COLONOSCOPY WITH PROPOFOL N/A 02/12/2019   Procedure: COLONOSCOPY WITH PROPOFOL;  Surgeon: Lin Landsman, MD;  Location: [facility] ENDOSCOPY;  Service: Gastroenterology;  Laterality: N/A;  . ESOPHAGOGASTRODUODENOSCOPY (EGD) WITH PROPOFOL N/A 02/12/2019   Procedure: ESOPHAGOGASTRODUODENOSCOPY (EGD) WITH PROPOFOL;  Surgeon: Lin Landsman, MD;  Location: [facility] ENDOSCOPY;  Service: Gastroenterology;  Laterality: N/A;    SOCIAL HISTORY: Social History   Socioeconomic History  . Marital status: Divorced    Spouse name: Not on file  . Number of children: Not on file  . Years of education: Not on file  . Highest education level: Not on file  Occupational History  . Not on file  Tobacco Use  . Smoking status: Never Smoker  . Smokeless tobacco: Current User    Types: Snuff  Substance and Sexual Activity  . Alcohol use: Not Currently    Alcohol/week: 14.0 standard drinks  Types: 14 Cans of beer per week    Comment: patient quit in october 2020  . Drug use: No  . Sexual activity: Not Currently  Other Topics Concern  . Not on file  Social History Narrative  . Not  on file   Social Determinants of Health   Financial Resource Strain:   . Difficulty of Paying Living Expenses: Not on file  Food Insecurity:   . Worried About Charity fundraiser in the Last Year: Not on file  . Ran Out of Food in the Last Year: Not on file  Transportation Needs:   . Lack of Transportation (Medical): Not on file  . Lack of Transportation (Non-Medical): Not on file  Physical Activity:   . Days of Exercise per Week: Not on file  . Minutes of Exercise per Session: Not on file  Stress:   . Feeling of Stress : Not on file  Social Connections:   . Frequency of Communication with Friends and Family: Not on file  . Frequency of Social Gatherings with Friends and Family: Not on file  . Attends Religious Services: Not on file  . Active Member of Clubs or Organizations: Not on file  . Attends Archivist Meetings: Not on file  . Marital Status: Not on file  Intimate Partner Violence:   . Fear of Current or Ex-Partner: Not on file  . Emotionally Abused: Not on file  . Physically Abused: Not on file  . Sexually Abused: Not on file    FAMILY HISTORY: Family History  Problem Relation Age of Onset  . Heart attack Brother 5    ALLERGIES:  is allergic to aspirin.  MEDICATIONS:  Current Outpatient Medications  Medication Sig Dispense Refill  . amLODipine (NORVASC) 5 MG tablet Take 5 mg by mouth daily.    Marland Kitchen apixaban (ELIQUIS) 5 MG TABS tablet Take 1 tablet (5 mg total) by mouth 2 (two) times daily. 180 tablet 2  . diltiazem (CARDIZEM CD) 120 MG 24 hr capsule Take 1 capsule (120 mg total) by mouth daily. 30 capsule 0  . escitalopram (LEXAPRO) 10 MG tablet Take 1 tablet (10 mg total) by mouth daily. For anxiety/sleep 90 tablet 2  . furosemide (LASIX) 20 MG tablet Take 20 mg by mouth daily.    . metoprolol tartrate (LOPRESSOR) 25 MG tablet Take 25 mg by mouth daily.    . mirtazapine (REMERON) 7.5 MG tablet Take 7.5 mg by mouth at bedtime.    Marland Kitchen spironolactone  (ALDACTONE) 25 MG tablet Take 1 tablet (25 mg total) by mouth daily. For cirrhosis of liver 90 tablet 2   No current facility-administered medications for this visit.     PHYSICAL EXAMINATION: ECOG PERFORMANCE STATUS: 1 - Symptomatic but completely ambulatory Vitals:   06/27/19 0933  BP: 123/77  Pulse: 82  Resp: 16  Temp: (!) 97.5 F (36.4 C)   Filed Weights   06/27/19 0933  Weight: 162 lb 8 oz (73.7 kg)    Physical Exam Constitutional:      General: She is not in acute distress.    Appearance: She is ill-appearing.  HENT:     Head: Normocephalic and atraumatic.  Eyes:     General: No scleral icterus. Cardiovascular:     Rate and Rhythm: Normal rate and regular rhythm.     Heart sounds: Normal heart sounds.  Pulmonary:     Effort: Pulmonary effort is normal. No respiratory distress.     Breath sounds: No wheezing.  Abdominal:     General: Bowel sounds are normal. There is no distension.     Palpations: Abdomen is soft.  Musculoskeletal:        General: Tenderness present. No deformity. Normal range of motion.     Cervical back: Normal range of motion and neck supple.     Comments: Right index finger swelling and tender with motion.  Skin:    General: Skin is warm and dry.     Findings: No erythema or rash.  Neurological:     Mental Status: She is alert and oriented to person, place, and time. Mental status is at baseline.     Cranial Nerves: No cranial nerve deficit.     Coordination: Coordination normal.  Psychiatric:        Mood and Affect: Mood normal.     LABORATORY DATA:  I have reviewed the data as listed Lab Results  Component Value Date   WBC 5.2 06/27/2019   HGB 11.7 (L) 06/27/2019   HCT 36.7 06/27/2019   MCV 95.1 06/27/2019   PLT 132 (L) 06/27/2019   Recent Labs    02/09/19 0634 02/10/19 0430 03/28/19 1700 03/28/19 1700 04/16/19 1337 06/06/19 1538 06/27/19 1024  NA 130*   < > 138   < > 135 140 134*  K 3.5   < > 3.1*   < > 3.9 4.7 4.3   CL 95*   < > 93*   < > 101 102 100  CO2 26   < > 32   < > '23 21 24  '$ GLUCOSE 124*   < > 120*   < > 168* 215* 318*  BUN 12   < > 9   < > 15 24 24*  CREATININE 1.14*   < > 1.31*   < > 1.46* 1.24* 1.12*  CALCIUM 7.0*   < > 8.8*   < > 8.9 9.5 9.2  GFRNONAA 50*   < > 43*   < > 37* 46* 52*  GFRAA 58*   < > 49*   < > 43* 53* 60*  PROT 6.5  --  9.3*  --   --   --  8.7*  ALBUMIN 2.1*  --  2.8*  --   --   --  3.1*  AST 45*  --  42*  --   --   --  24  ALT 18  --  16  --   --   --  16  ALKPHOS 34*  --  56  --   --   --  85  BILITOT 2.2*  --  2.4*  --   --   --  0.8   < > = values in this interval not displayed.   Iron/TIBC/Ferritin/ %Sat    Component Value Date/Time   IRON 76 02/11/2019 0616   TIBC 122 (L) 02/11/2019 0616   FERRITIN 427 (H) 02/11/2019 0616   IRONPCTSAT 62 (H) 02/11/2019 9201      RADIOGRAPHIC STUDIES: I have personally reviewed the radiological images as listed and agreed with the findings in the report. CT ABDOMEN W WO CONTRAST  Result Date: 05/09/2019 CLINICAL DATA:  Abdominal pain.  Cirrhosis. EXAM: CT ABDOMEN WITHOUT AND WITH CONTRAST TECHNIQUE: Multidetector CT imaging of the abdomen was performed following the standard protocol before and following the bolus administration of intravenous contrast. CONTRAST:  9m OMNIPAQUE IOHEXOL 300 MG/ML  SOLN COMPARISON:  None. FINDINGS: Lower chest: No pleural effusion. Annular calcification noted mitral  valve. Coronary artery calcification is evident. Hepatobiliary: Nodular liver contour compatible with the reported clinical history of cirrhosis. No focal mass lesion evident within the liver parenchyma. Tiny gallstones evident. No intrahepatic or extrahepatic biliary dilation. Pancreas: No focal mass lesion. No dilatation of the main duct. No intraparenchymal cyst. No peripancreatic edema. Tiny 3-4 mm hypoattenuating foci in the tail of pancreas (images 35 and 37 of series 9). Spleen: 9 mm hypoattenuating lesion in the anterior spleen  is too small to characterize but likely benign. Adrenals/Urinary Tract: No adrenal nodule or mass. Kidneys unremarkable. Stomach/Bowel: Stomach is unremarkable. No gastric wall thickening. No evidence of outlet obstruction. Duodenum does not cross the midline as expected. No small bowel or colonic dilatation within the visualized abdomen. Vascular/Lymphatic: There is abdominal aortic atherosclerosis without aneurysm. There is no gastrohepatic or hepatoduodenal ligament lymphadenopathy. No retroperitoneal or mesenteric lymphadenopathy. Other: Trace volume free fluid noted around the liver inferiorly. Musculoskeletal: No worrisome lytic or sclerotic osseous abnormality. IMPRESSION: 1. Cirrhotic liver morphology without focal mass lesion evident. 2. Trace ascites. 3. Cholelithiasis. 4. Tiny hypodensities in the pancreatic tail, too small to characterize and potentially cystic. Follow-up CT in 3-6 months could be used to ensure stability. If the patient is able to reproducibly breath hold, abdominal MRI could be performed. Electronically Signed   By: Misty Stanley M.D.   On: 05/09/2019 14:16      ASSESSMENT & PLAN:  1. Elevated blood protein   2. Finger pain, right   3. Acute gout of right hand, unspecified cause    Labs reviewed and discussed with patient. Elevated serum protein can be secondary to acute or chronic infection/inflammation, or other bone marrow disorders. Recommend checking CBC, CMP, multiple myeloma panel, light chain ratio, HIV. Patient has had hepatitis panel checked recently   #right finger joint pain Check ANA and uric acid- Uric acid level came back elevated at 8.7.  Her right finger joint pain likely secondary to gout flare.  I will start patient on allopurinol '100mg'$  daily  Alcoholic liver cirrhosis, she has stopped drinking alcohol.  Continue follow-up with gastroenterology. HCV antibody was positive.  Per note, she was treated with hepatitis C in the past.  Atrial  fibrillation, patient is on chronic anticoagulation with apixaban.  Orders Placed This Encounter  Procedures  . Comprehensive metabolic panel    Standing Status:   Future    Number of Occurrences:   1    Standing Expiration Date:   12/27/2020  . CBC with Differential/Platelet    Standing Status:   Future    Number of Occurrences:   1    Standing Expiration Date:   12/27/2020  . Technologist smear review    Standing Status:   Future    Number of Occurrences:   1    Standing Expiration Date:   12/27/2020  . ANA, IFA (with reflex)    Standing Status:   Future    Number of Occurrences:   1    Standing Expiration Date:   12/27/2020  . Multiple Myeloma Panel (SPEP&IFE w/QIG)    Standing Status:   Future    Number of Occurrences:   1    Standing Expiration Date:   12/27/2020  . Kappa/lambda light chains    Standing Status:   Future    Number of Occurrences:   1    Standing Expiration Date:   12/27/2020  . Uric acid    Standing Status:   Future    Number of  Occurrences:   1    Standing Expiration Date:   06/26/2020    All questions were answered. The patient knows to call the clinic with any problems questions or concerns.  Return of visit: 2 weeks to discuss lab results.  Earlie Server, MD, PhD Hematology Oncology [facility] at [facility] Pager- [phone] 06/27/2019

## 2019-06-28 ENCOUNTER — Telehealth: Payer: Self-pay

## 2019-06-28 LAB — KAPPA/LAMBDA LIGHT CHAINS
Kappa free light chain: 123.3 mg/L — ABNORMAL HIGH (ref 3.3–19.4)
Kappa, lambda light chain ratio: 1.25 (ref 0.26–1.65)
Lambda free light chains: 98.6 mg/L — ABNORMAL HIGH (ref 5.7–26.3)

## 2019-06-28 LAB — ANTINUCLEAR ANTIBODIES, IFA: ANA Ab, IFA: NEGATIVE

## 2019-06-28 NOTE — Telephone Encounter (Signed)
-----   Message from Earlie Server, MD sent at 06/27/2019  7:59 PM EST ----- Please let patient know that her uric acid level is high and right finger pain is possible secondary to gout flare. I recommend patient to start taking allopurinol.  Prescription was sent to pharmacy.

## 2019-06-28 NOTE — Telephone Encounter (Signed)
Patient informed. 

## 2019-07-02 LAB — MULTIPLE MYELOMA PANEL, SERUM
Albumin SerPl Elph-Mcnc: 3.3 g/dL (ref 2.9–4.4)
Albumin/Glob SerPl: 0.7 (ref 0.7–1.7)
Alpha 1: 0.3 g/dL (ref 0.0–0.4)
Alpha2 Glob SerPl Elph-Mcnc: 0.7 g/dL (ref 0.4–1.0)
B-Globulin SerPl Elph-Mcnc: 1.2 g/dL (ref 0.7–1.3)
Gamma Glob SerPl Elph-Mcnc: 3 g/dL — ABNORMAL HIGH (ref 0.4–1.8)
Globulin, Total: 5.1 g/dL — ABNORMAL HIGH (ref 2.2–3.9)
IgA: 793 mg/dL — ABNORMAL HIGH (ref 87–352)
IgG (Immunoglobin G), Serum: 2901 mg/dL — ABNORMAL HIGH (ref 586–1602)
IgM (Immunoglobulin M), Srm: 146 mg/dL (ref 26–217)
Total Protein ELP: 8.4 g/dL (ref 6.0–8.5)

## 2019-07-04 ENCOUNTER — Telehealth: Payer: Self-pay

## 2019-07-04 DIAGNOSIS — R772 Abnormality of alphafetoprotein: Secondary | ICD-10-CM

## 2019-07-04 DIAGNOSIS — K760 Fatty (change of) liver, not elsewhere classified: Secondary | ICD-10-CM

## 2019-07-04 NOTE — Telephone Encounter (Signed)
-----   Message from Lin Landsman, MD sent at 07/03/2019  6:02 PM EDT ----- Regarding: MRI liver Adriana Miller  Please schedule MRI liver mass protocol  Reason, elevated AFP  Thanks RV

## 2019-07-04 NOTE — Telephone Encounter (Signed)
Patient states she has not been certified for the transportation this year yet. Patient states she is going to call and see if she can get it again this year. When she finds this out she will give [place] a call back to scheduled this

## 2019-07-09 NOTE — Telephone Encounter (Signed)
Patient states she has transportation now and can go pretty much any day. Called and got the patient scheduled for 07/23/2019 [facility] at 12:30pm NPO 4 hours before the scan. Patient verbalized understanding

## 2019-07-12 ENCOUNTER — Encounter: Payer: Self-pay | Admitting: Oncology

## 2019-07-12 ENCOUNTER — Inpatient Hospital Stay (HOSPITAL_BASED_OUTPATIENT_CLINIC_OR_DEPARTMENT_OTHER): Payer: Medicare Other | Admitting: Oncology

## 2019-07-12 VITALS — BP 149/77 | HR 84 | Temp 97.3°F | Wt 161.7 lb

## 2019-07-12 DIAGNOSIS — Z7901 Long term (current) use of anticoagulants: Secondary | ICD-10-CM | POA: Diagnosis not present

## 2019-07-12 DIAGNOSIS — E785 Hyperlipidemia, unspecified: Secondary | ICD-10-CM | POA: Diagnosis not present

## 2019-07-12 DIAGNOSIS — Z79899 Other long term (current) drug therapy: Secondary | ICD-10-CM | POA: Diagnosis not present

## 2019-07-12 DIAGNOSIS — N189 Chronic kidney disease, unspecified: Secondary | ICD-10-CM | POA: Diagnosis not present

## 2019-07-12 DIAGNOSIS — R531 Weakness: Secondary | ICD-10-CM | POA: Diagnosis not present

## 2019-07-12 DIAGNOSIS — M79644 Pain in right finger(s): Secondary | ICD-10-CM | POA: Diagnosis not present

## 2019-07-12 DIAGNOSIS — Z8249 Family history of ischemic heart disease and other diseases of the circulatory system: Secondary | ICD-10-CM | POA: Diagnosis not present

## 2019-07-12 DIAGNOSIS — R5383 Other fatigue: Secondary | ICD-10-CM | POA: Diagnosis not present

## 2019-07-12 DIAGNOSIS — I4891 Unspecified atrial fibrillation: Secondary | ICD-10-CM | POA: Diagnosis not present

## 2019-07-12 DIAGNOSIS — M109 Gout, unspecified: Secondary | ICD-10-CM

## 2019-07-12 DIAGNOSIS — I10 Essential (primary) hypertension: Secondary | ICD-10-CM | POA: Diagnosis not present

## 2019-07-12 DIAGNOSIS — E8809 Other disorders of plasma-protein metabolism, not elsewhere classified: Secondary | ICD-10-CM

## 2019-07-12 DIAGNOSIS — K766 Portal hypertension: Secondary | ICD-10-CM | POA: Diagnosis not present

## 2019-07-12 DIAGNOSIS — E538 Deficiency of other specified B group vitamins: Secondary | ICD-10-CM | POA: Diagnosis not present

## 2019-07-12 DIAGNOSIS — R779 Abnormality of plasma protein, unspecified: Secondary | ICD-10-CM

## 2019-07-12 DIAGNOSIS — E119 Type 2 diabetes mellitus without complications: Secondary | ICD-10-CM | POA: Diagnosis not present

## 2019-07-12 NOTE — Progress Notes (Signed)
Patient does not offer any problems today.  

## 2019-07-12 NOTE — Progress Notes (Signed)
Hematology/Oncology Consult note [facility] Telephone:([phone] [phone]   Patient Care Team: Ronnell Freshwater, NP as PCP - General (Family Medicine) End, Harrell Gave, MD as PCP - Cardiology (Cardiology)  REFERRING PROVIDER: Ronnell Freshwater, NP  CHIEF COMPLAINTS/REASON FOR VISIT:  Evaluation of elevated protein  HISTORY OF PRESENTING ILLNESS:   Adriana Miller is a  64 y.o.  female with PMH listed below was seen in consultation at the request of  Ronnell Freshwater, NP  for evaluation of elevated protein Patient has a history of alcoholic cirrhosis.  Recent admission due to bloody diarrhea and anemia due to acute blood loss.  She underwent EGD and colonoscopy.  EGD showed gastric bleeding erosion which was cauterized.  Evidence of portal hypertensive gastropathy.  Colonoscopy showed 2 small polyps that were removed. She has stopped drinking alcohol since discharge. 05/09/2019, cirrhotic liver morphology without focal mass lesion evident.  Trace ascites.  Cholelithiasis.  Tiny hypodensities in the pancreatic tail too small to characterize and potentially cystic.  Follow-up CT 3 to 6 months to ensure stability.  Abdominal MRI could be performed if patient is able to hold breath. Her blood work on 03/28/2019 showed protein level of 9.3, total bilirubin 2.4, Patient was referred to me for evaluation of elevated protein level. Patient reports feeling fatigued and weak.  Appetite is poor. She has multiple joint pain.  She reports swelling of right index finger.   INTERVAL HISTORY Adriana Miller is a 64 y.o. female who has above history reviewed by me today presents for follow up visit for management of elevated protein and AFP. Problems and complaints are listed below: Patient has blood work done during interval.  She has no new complaints. Uric acid was checked and was elevated.  Patient has been advised to start taking allopurinol. Today right index  finger pain has improved.  Review of Systems  Constitutional: Positive for appetite change, fatigue and unexpected weight change. Negative for chills and fever.  HENT:   Negative for hearing loss and voice change.   Eyes: Negative for eye problems.  Respiratory: Negative for chest tightness and cough.   Cardiovascular: Negative for chest pain.  Gastrointestinal: Negative for abdominal distention, abdominal pain and blood in stool.  Endocrine: Negative for hot flashes.  Genitourinary: Negative for difficulty urinating and frequency.   Musculoskeletal: Positive for arthralgias.  Skin: Negative for itching and rash.  Neurological: Negative for extremity weakness.  Hematological: Negative for adenopathy. Bruises/bleeds easily.  Psychiatric/Behavioral: Negative for confusion.    MEDICAL HISTORY:  Past Medical History:  Diagnosis Date  . B12 deficiency 04/05/2017  . Blood in stool   . Cerebral aneurysm   . Chronic kidney disease   . Diabetes mellitus without complication ([place])   . Gastric erosion with bleeding   . Hyperlipidemia   . Hypertension   . Lower extremity cellulitis 02/08/2019  . Seizure disorder [facility])     SURGICAL HISTORY: Past Surgical History:  Procedure Laterality Date  . CEREBRAL ANEURYSM REPAIR  1991  . COLONOSCOPY WITH PROPOFOL N/A 02/12/2019   Procedure: COLONOSCOPY WITH PROPOFOL;  Surgeon: Lin Landsman, MD;  Location: [facility] ENDOSCOPY;  Service: Gastroenterology;  Laterality: N/A;  . ESOPHAGOGASTRODUODENOSCOPY (EGD) WITH PROPOFOL N/A 02/12/2019   Procedure: ESOPHAGOGASTRODUODENOSCOPY (EGD) WITH PROPOFOL;  Surgeon: Lin Landsman, MD;  Location[facility] ENDOSCOPY;  Service: Gastroenterology;  Laterality: N/A;    SOCIAL HISTORY: Social History   Socioeconomic History  . Marital status: Divorced    Spouse  name: Not on file  . Number of children: Not on file  . Years of education: Not on file  . Highest education level: Not on file  Occupational  History  . Not on file  Tobacco Use  . Smoking status: Never Smoker  . Smokeless tobacco: Current User    Types: Snuff  Substance and Sexual Activity  . Alcohol use: Not Currently    Alcohol/week: 14.0 standard drinks    Types: 14 Cans of beer per week    Comment: patient quit in october 2020  . Drug use: No  . Sexual activity: Not Currently  Other Topics Concern  . Not on file  Social History Narrative  . Not on file   Social Determinants of Health   Financial Resource Strain:   . Difficulty of Paying Living Expenses:   Food Insecurity:   . Worried About Charity fundraiser in the Last Year:   . Arboriculturist in the Last Year:   Transportation Needs:   . Film/video editor (Medical):   Marland Kitchen Lack of Transportation (Non-Medical):   Physical Activity:   . Days of Exercise per Week:   . Minutes of Exercise per Session:   Stress:   . Feeling of Stress :   Social Connections:   . Frequency of Communication with Friends and Family:   . Frequency of Social Gatherings with Friends and Family:   . Attends Religious Services:   . Active Member of Clubs or Organizations:   . Attends Archivist Meetings:   Marland Kitchen Marital Status:   Intimate Partner Violence:   . Fear of Current or Ex-Partner:   . Emotionally Abused:   Marland Kitchen Physically Abused:   . Sexually Abused:     FAMILY HISTORY: Family History  Problem Relation Age of Onset  . Heart attack Brother 34    ALLERGIES:  is allergic to aspirin.  MEDICATIONS:  Current Outpatient Medications  Medication Sig Dispense Refill  . allopurinol (ZYLOPRIM) 100 MG tablet Take 1 tablet (100 mg total) by mouth daily. 30 tablet 0  . amLODipine (NORVASC) 5 MG tablet Take 5 mg by mouth daily.    Marland Kitchen apixaban (ELIQUIS) 5 MG TABS tablet Take 1 tablet (5 mg total) by mouth 2 (two) times daily. 180 tablet 2  . diltiazem (CARDIZEM CD) 120 MG 24 hr capsule Take 1 capsule (120 mg total) by mouth daily. 30 capsule 0  . escitalopram (LEXAPRO)  10 MG tablet Take 1 tablet (10 mg total) by mouth daily. For anxiety/sleep 90 tablet 2  . furosemide (LASIX) 20 MG tablet Take 20 mg by mouth daily.    . metoprolol tartrate (LOPRESSOR) 25 MG tablet Take 25 mg by mouth daily.    . mirtazapine (REMERON) 7.5 MG tablet Take 7.5 mg by mouth at bedtime.    Marland Kitchen spironolactone (ALDACTONE) 25 MG tablet Take 1 tablet (25 mg total) by mouth daily. For cirrhosis of liver 90 tablet 2   No current facility-administered medications for this visit.     PHYSICAL EXAMINATION: ECOG PERFORMANCE STATUS: 1 - Symptomatic but completely ambulatory Vitals:   07/12/19 1011  BP: (!) 149/77  Pulse: 84  Temp: (!) 97.3 F (36.3 C)  SpO2: 98%   Filed Weights   07/12/19 1011  Weight: 161 lb 11.2 oz (73.3 kg)    Physical Exam Constitutional:      General: She is not in acute distress.    Appearance: She is ill-appearing.  HENT:  Head: Normocephalic and atraumatic.  Eyes:     General: No scleral icterus. Cardiovascular:     Rate and Rhythm: Normal rate and regular rhythm.     Heart sounds: Normal heart sounds.  Pulmonary:     Effort: Pulmonary effort is normal. No respiratory distress.     Breath sounds: No wheezing.  Abdominal:     General: Bowel sounds are normal. There is no distension.     Palpations: Abdomen is soft.  Musculoskeletal:        General: No tenderness or deformity. Normal range of motion.     Cervical back: Normal range of motion and neck supple.     Comments: Right index finger tenderness improved  Skin:    General: Skin is warm and dry.     Findings: No erythema or rash.  Neurological:     Mental Status: She is alert and oriented to person, place, and time. Mental status is at baseline.     Cranial Nerves: No cranial nerve deficit.     Coordination: Coordination normal.  Psychiatric:        Mood and Affect: Mood normal.     LABORATORY DATA:  I have reviewed the data as listed Lab Results  Component Value Date   WBC  5.2 06/27/2019   HGB 11.7 (L) 06/27/2019   HCT 36.7 06/27/2019   MCV 95.1 06/27/2019   PLT 132 (L) 06/27/2019   Recent Labs    02/09/19 0634 02/10/19 0430 03/28/19 1700 03/28/19 1700 04/16/19 1337 06/06/19 1538 06/27/19 1024  NA 130*   < > 138   < > 135 140 134*  K 3.5   < > 3.1*   < > 3.9 4.7 4.3  CL 95*   < > 93*   < > 101 102 100  CO2 26   < > 32   < > '23 21 24  '$ GLUCOSE 124*   < > 120*   < > 168* 215* 318*  BUN 12   < > 9   < > 15 24 24*  CREATININE 1.14*   < > 1.31*   < > 1.46* 1.24* 1.12*  CALCIUM 7.0*   < > 8.8*   < > 8.9 9.5 9.2  GFRNONAA 50*   < > 43*   < > 37* 46* 52*  GFRAA 58*   < > 49*   < > 43* 53* 60*  PROT 6.5  --  9.3*  --   --   --  8.7*  ALBUMIN 2.1*  --  2.8*  --   --   --  3.1*  AST 45*  --  42*  --   --   --  24  ALT 18  --  16  --   --   --  16  ALKPHOS 34*  --  56  --   --   --  85  BILITOT 2.2*  --  2.4*  --   --   --  0.8   < > = values in this interval not displayed.   Iron/TIBC/Ferritin/ %Sat    Component Value Date/Time   IRON 76 02/11/2019 0616   TIBC 122 (L) 02/11/2019 0616   FERRITIN 427 (H) 02/11/2019 0616   IRONPCTSAT 62 (H) 02/11/2019 9528      RADIOGRAPHIC STUDIES: I have personally reviewed the radiological images as listed and agreed with the findings in the report. CT ABDOMEN W WO CONTRAST  Result Date: 05/09/2019 CLINICAL DATA:  Abdominal pain.  Cirrhosis. EXAM: CT ABDOMEN WITHOUT AND WITH CONTRAST TECHNIQUE: Multidetector CT imaging of the abdomen was performed following the standard protocol before and following the bolus administration of intravenous contrast. CONTRAST:  90m OMNIPAQUE IOHEXOL 300 MG/ML  SOLN COMPARISON:  None. FINDINGS: Lower chest: No pleural effusion. Annular calcification noted mitral valve. Coronary artery calcification is evident. Hepatobiliary: Nodular liver contour compatible with the reported clinical history of cirrhosis. No focal mass lesion evident within the liver parenchyma. Tiny gallstones  evident. No intrahepatic or extrahepatic biliary dilation. Pancreas: No focal mass lesion. No dilatation of the main duct. No intraparenchymal cyst. No peripancreatic edema. Tiny 3-4 mm hypoattenuating foci in the tail of pancreas (images 35 and 37 of series 9). Spleen: 9 mm hypoattenuating lesion in the anterior spleen is too small to characterize but likely benign. Adrenals/Urinary Tract: No adrenal nodule or mass. Kidneys unremarkable. Stomach/Bowel: Stomach is unremarkable. No gastric wall thickening. No evidence of outlet obstruction. Duodenum does not cross the midline as expected. No small bowel or colonic dilatation within the visualized abdomen. Vascular/Lymphatic: There is abdominal aortic atherosclerosis without aneurysm. There is no gastrohepatic or hepatoduodenal ligament lymphadenopathy. No retroperitoneal or mesenteric lymphadenopathy. Other: Trace volume free fluid noted around the liver inferiorly. Musculoskeletal: No worrisome lytic or sclerotic osseous abnormality. IMPRESSION: 1. Cirrhotic liver morphology without focal mass lesion evident. 2. Trace ascites. 3. Cholelithiasis. 4. Tiny hypodensities in the pancreatic tail, too small to characterize and potentially cystic. Follow-up CT in 3-6 months could be used to ensure stability. If the patient is able to reproducibly breath hold, abdominal MRI could be performed. Electronically Signed   By: EMisty StanleyM.D.   On: 05/09/2019 14:16      ASSESSMENT & PLAN:  1. Elevated blood protein   2. Acute gout of right hand, unspecified cause    #Elevated protein Labs reviewed and discussed with patient. Multiple myeloma work-up showed no monoclonal protein bands, immunofixation showed polyclonal increase detected in 1 or more immunoglobulins, likely secondary to reactive process/inflammation. Elevated free light chain, with normal ratio, likely secondary to chronic kidney disease.  #Gout, uric acid was elevated.  Patient was started on  allopurinol.  Today right finger pain has resolved.  Advised patient to update her primary care provider about her recent gout attack.  Defer primary care provider for further management for gout.    Alcoholic liver cirrhosis, she has stopped drinking alcohol and follows up with gastroenterology. HCV antibody was positive. Elevated AFP, CT abdomen with and without contrast showed cirrhotic liver without focal mass. Dr. VMarius Ditchhas scheduled patient to obtain MRI for further evaluation and agree. If no liver mass are discovered, AFP may be elevated mostly likely due to hepatitis/cirohsis, other etiology--germ cell tumor. If no liver or GI tract etiology was found, I recommend to obtain pelvic image, check HCG and LDH   Follow up: to be determined.  All questions were answered. The patient knows to call the clinic with any problems questions or concerns. .Earlie Server MD, PhD Hematology Oncology [facility]ab[facility]trat AMedical City [place] ColinasPager- [phone]/25/2021

## 2019-07-22 ENCOUNTER — Other Ambulatory Visit: Payer: Self-pay | Admitting: Oncology

## 2019-07-23 ENCOUNTER — Other Ambulatory Visit: Payer: Self-pay

## 2019-07-23 ENCOUNTER — Ambulatory Visit
Admission: RE | Admit: 2019-07-23 | Discharge: 2019-07-23 | Disposition: A | Discharge status: Home / Self Care | Payer: Medicare Other | Source: Ambulatory Visit | Attending: Gastroenterology | Admitting: Gastroenterology

## 2019-07-23 DIAGNOSIS — K760 Fatty (change of) liver, not elsewhere classified: Secondary | ICD-10-CM | POA: Insufficient documentation

## 2019-07-23 DIAGNOSIS — R772 Abnormality of alphafetoprotein: Secondary | ICD-10-CM | POA: Insufficient documentation

## 2019-07-23 DIAGNOSIS — K7460 Unspecified cirrhosis of liver: Secondary | ICD-10-CM | POA: Diagnosis not present

## 2019-07-23 IMAGING — MR MR ABDOMEN WO/W CM
19 series · 42 of 48 positions shown · IV contrast (7ml Gadavist)
Comparison: [DATE] CT abdomen.

CLINICAL DATA: Elevated AFP. Cirrhosis. Abdominal pain.

EXAM:
MRI ABDOMEN WITHOUT AND WITH CONTRAST
TECHNIQUE: Multiplanar multisequence MR imaging of the abdomen was performed
both before and after the administration of intravenous contrast.
CONTRAST:  7mL GADAVIST GADOBUTROL 1 MMOL/ML IV SOLN

[Series 2: T2 · coronal · 6.0mm · 1.19mm/px · 1 of 30 slices shown (1 of 2)]
[im 1/30]
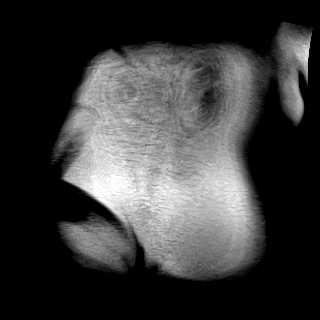

[Series 5: T2 fat-sat · axial · 6.0mm · 1.19mm/px · 1 of 34 slices shown (1 of 2)]
[im 1/34]
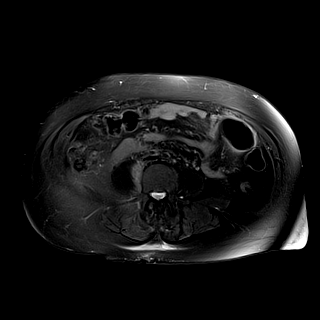

[Series 6: ax dwi_tracew · axial · 6.0mm · 1.42mm/px · z∈[-103,+134]mm · 3 of 102 slices shown]
[im 1/102]
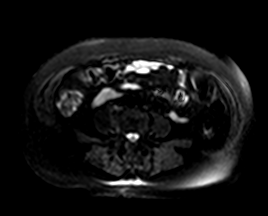
[im 51/102]
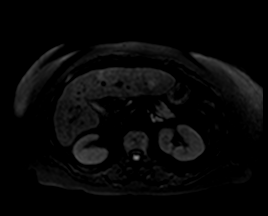
[im 102/102]
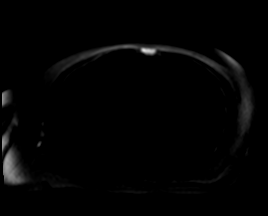

[Series 7: ax dwi_adc · axial · 6.0mm · 1.42mm/px · 1 of 34 slices shown]
[im 1/34]
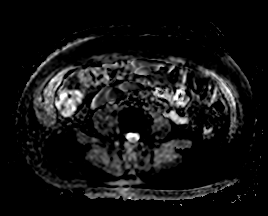

[Series 8: T2 · axial · 6.0mm · 1.19mm/px · 1 of 32 slices shown (2 of 2)]
[im 1/32]
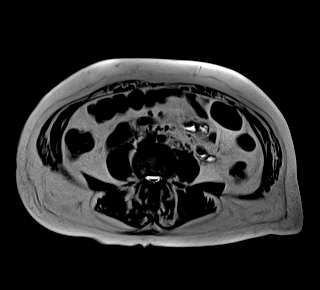

[Series 9: T1 · axial · 6.0mm · 0.74mm/px · 1 of 32 slices shown (1 of 2)]
[im 1/32]
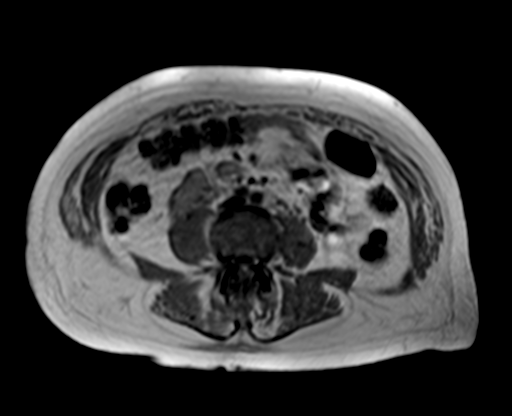

[Series 9: T1 · axial · 6.0mm · 0.74mm/px · 1 of 32 slices shown (2 of 2)]
[im 1/32]
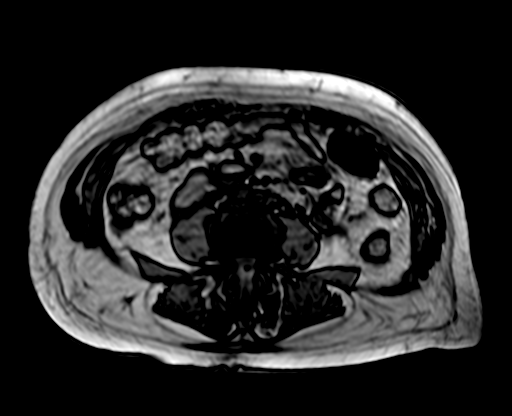

[Series 10: bSSFP · axial · 6.0mm · 0.74mm/px · 1 of 32 slices shown]
[im 1/32]
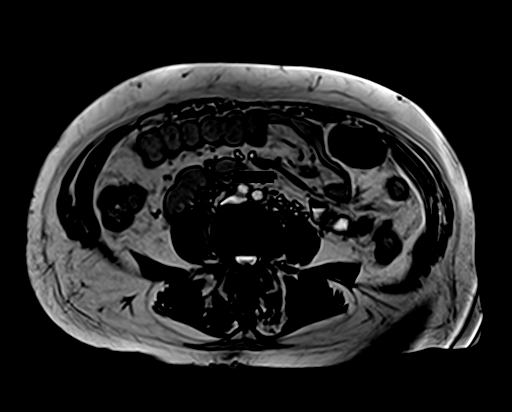

[Series 11: T1 dynamic fat-sat · axial · non-contrast · 3.0mm · 1.19mm/px · z∈[-99,+138]mm · 3 of 80 slices shown (1 of 5)]
[im 1/80]
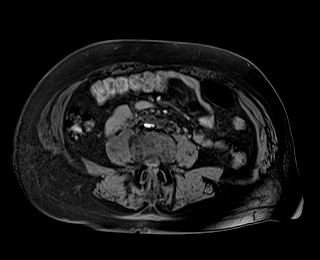
[im 40/80]
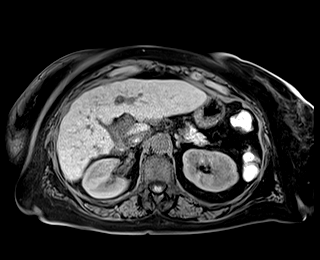
[im 80/80]
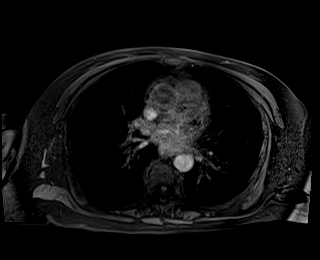

[Series 12: T1 dynamic fat-sat post-contrast · axial · 3.0mm · 1.19mm/px · z∈[-99,+138]mm · 3 of 80 slices shown (1 of 4)]
[im 1/80]
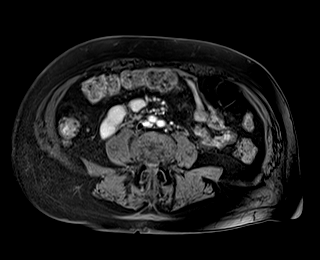
[im 40/80]
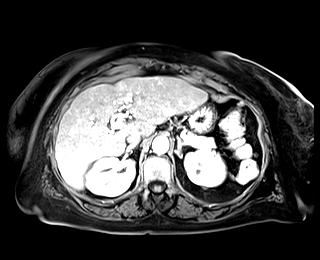
[im 80/80]
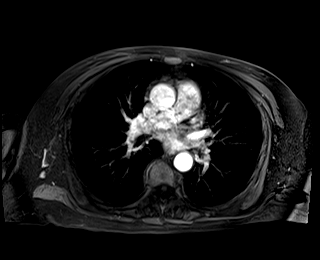

[Series 13: T1 dynamic fat-sat · axial · 3.0mm · 1.19mm/px · z∈[-99,+138]mm · 3 of 80 slices shown (2 of 5)]
[im 1/80]
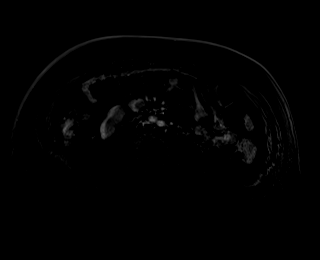
[im 40/80]
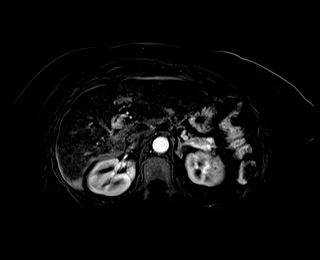
[im 80/80]
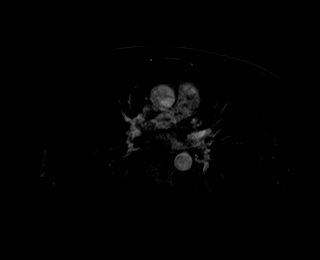

[Series 14: T1 dynamic fat-sat post-contrast · axial · 3.0mm · 1.19mm/px · z∈[-99,+138]mm · 3 of 80 slices shown (2 of 4)]
[im 1/80]
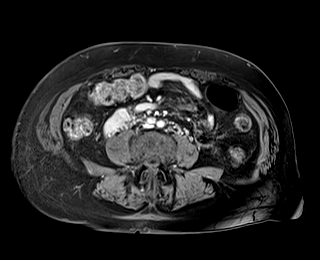
[im 40/80]
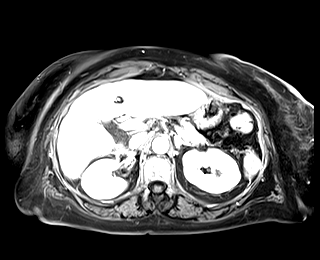
[im 80/80]
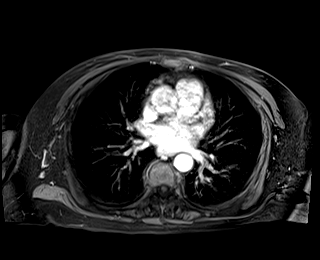

[Series 15: T1 dynamic fat-sat · axial · 3.0mm · 1.19mm/px · z∈[-99,+138]mm · 3 of 80 slices shown (3 of 5)]
[im 1/80]
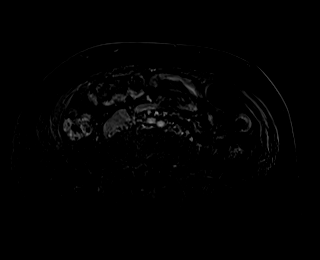
[im 40/80]
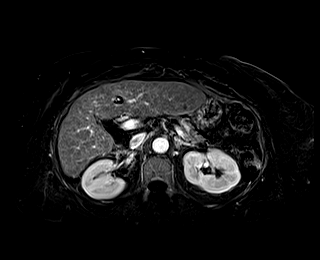
[im 80/80]
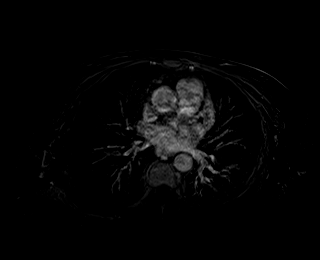

[Series 16: T1 dynamic fat-sat post-contrast · axial · 3.0mm · 1.19mm/px · z∈[-99,+138]mm · 3 of 80 slices shown (3 of 4)]
[im 1/80]
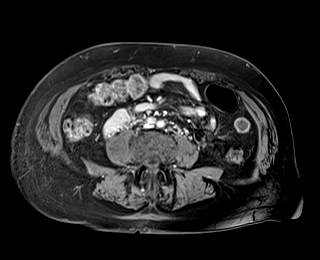
[im 40/80]
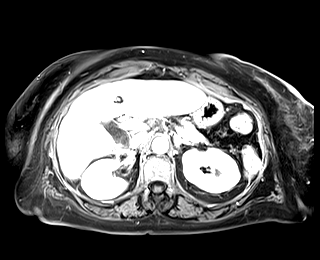
[im 80/80]
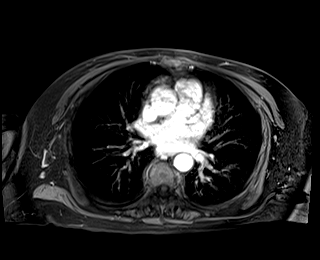

[Series 17: T1 dynamic fat-sat · axial · 3.0mm · 1.19mm/px · z∈[-99,+138]mm · 3 of 80 slices shown (4 of 5)]
[im 1/80]
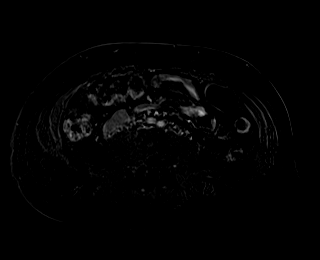
[im 40/80]
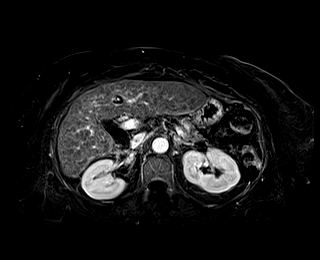
[im 80/80]
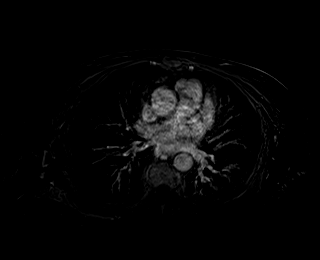

[Series 18: T1 dynamic post-contrast · coronal · 3.0mm · 1.31mm/px · 3 of 72 slices shown]
[im 1/72]
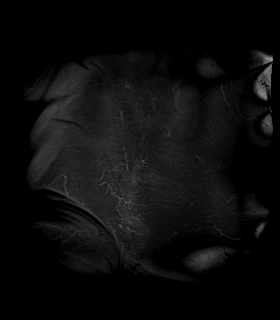
[im 36/72]
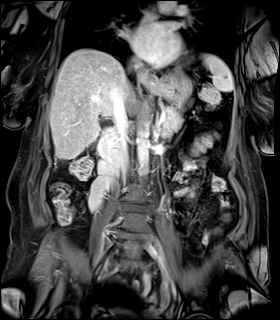
[im 72/72]
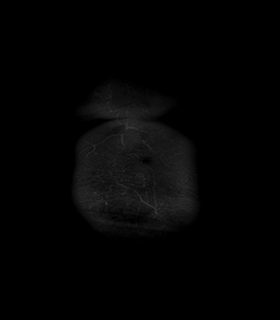

[Series 19: T1 dynamic fat-sat post-contrast · axial · 3.0mm · 1.19mm/px · z∈[-99,+138]mm · 3 of 80 slices shown (4 of 4)]
[im 1/80]
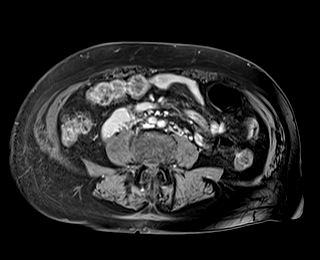
[im 40/80]
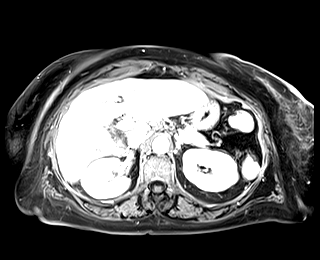
[im 80/80]
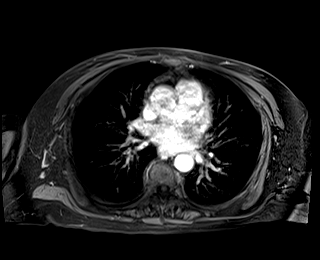

[Series 20: T1 dynamic fat-sat · axial · 3.0mm · 1.19mm/px · z∈[-99,+138]mm · 3 of 80 slices shown (5 of 5)]
[im 1/80]
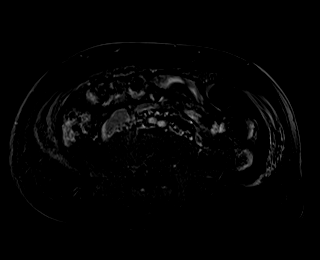
[im 40/80]
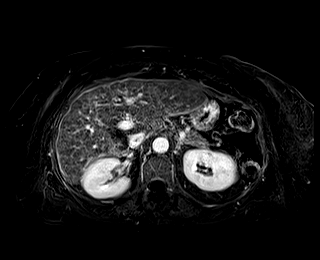
[im 80/80]
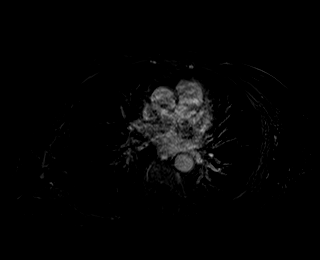

[Series 1026: T2 fat-sat · 0.74mm/px · 2 of 208 slices shown (2 of 2)]
[im 1/208]
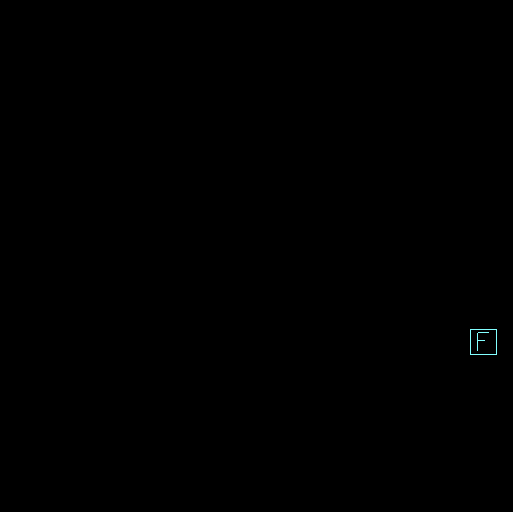
[im 30/208]
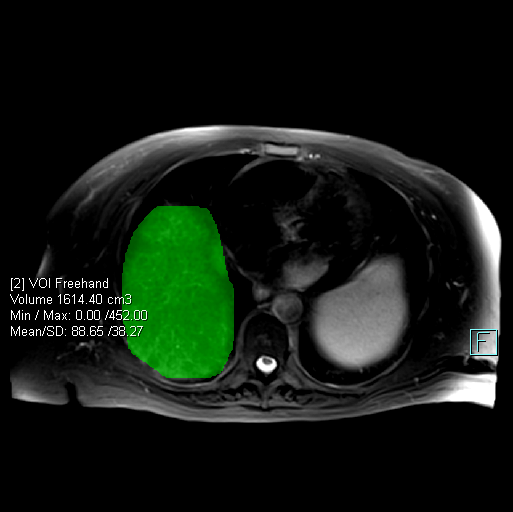

[42 of 48 positions shown; findings below may reference images not displayed]

FINDINGS: Lower chest: No acute abnormality at the lung bases.

Hepatobiliary: Diffusely irregular liver surface with slight
hypertrophy of the lateral segment left liver lobe. Patchy lace-like
T2 hyperintensity throughout the liver compatible with confluent
hepatic fibrosis. These findings are all compatible with cirrhosis.
No hepatic steatosis. Posterior segment 2 left liver lobe 2.0 x
cm mass (series 12/image 36) with arterial phase hyperenhancement,
subtle washout and subtle capsule formation, unchanged since
[DATE] CT abdomen study, considered LR-5. Far inferior segment 3
left liver lobe 1.7 x 1.3 cm mass (series 12/image 62) with
prominent arterial phase hyperenhancement with suggestion of subtle
washout and capsule formation, stable since [DATE] CT,
considered LR-5. Minimally complex 0.8 cm segment 6 right liver cyst
(series 8/image 15) with thin internal septation. Lobulated cystic
2.1 x 2.1 cm focus along the inferior right liver capsule (series
8/image 22). Numerous layering subcentimeter gallstones. No
gallbladder wall thickening. No pericholecystic fluid. No biliary
ductal dilatation. Common bile duct diameter 2 mm. No
choledocholithiasis. No biliary strictures, masses or beading.

Pancreas: No pancreatic mass or duct dilation.  No pancreas divisum.

Spleen: Normal size spleen. Tiny 0.9 cm anterior splenic cystic
lesion with suggestion of thin internal septations, favoring a
benign lymphangioma. No additional splenic lesions.

Adrenals/Urinary Tract: Normal adrenals. No hydronephrosis.
Subcentimeter T1 hyperintense renal cortical lesions in the
posterior upper left kidney and posterior lower right kidney
demonstrate no convincing enhancement, compatible with Bosniak
category 2 hemorrhagic/proteinaceous renal cysts. Additional
subcentimeter simple renal cysts scattered in the left kidney. No
suspicious renal masses.

Stomach/Bowel: Small hiatal hernia. Otherwise normal nondistended
stomach. Visualized small and large bowel is normal caliber, with no
bowel wall thickening. Mild left colonic diverticulosis.

Vascular/Lymphatic: Normal caliber abdominal aorta. Patent portal,
splenic, hepatic and renal veins. No pathologically enlarged lymph
nodes in the abdomen.

Other: No abdominal ascites or focal fluid collection.

Musculoskeletal: No aggressive appearing focal osseous lesions.
IMPRESSION: 1. Cirrhosis. Posterior segment 2 left liver lobe 2.0 cm mass and
inferior segment 3 left liver lobe 1.7 cm mass, both with avid
arterial hyperenhancement, both considered LR-5 (definitely HCC).
2. No ascites.
3. Cholelithiasis. No biliary ductal dilatation. No
choledocholithiasis.
4. Small hiatal hernia.
5. Mild left colonic diverticulosis.

## 2019-07-23 MED ORDER — GADOBUTROL 1 MMOL/ML IV SOLN
7.0000 mL | Freq: Once | INTRAVENOUS | Status: AC | PRN
  Administered 2019-07-23: 7 mL via INTRAVENOUS

## 2019-07-25 ENCOUNTER — Observation Stay
Admission: EM | Admit: 2019-07-25 | Discharge: 2019-07-26 | Disposition: A | Discharge status: Home / Self Care | Payer: Medicare Other | Attending: Internal Medicine | Admitting: Internal Medicine

## 2019-07-25 ENCOUNTER — Other Ambulatory Visit: Payer: Self-pay

## 2019-07-25 ENCOUNTER — Ambulatory Visit (INDEPENDENT_AMBULATORY_CARE_PROVIDER_SITE_OTHER): Payer: Medicare Other | Admitting: Internal Medicine

## 2019-07-25 ENCOUNTER — Emergency Department: Payer: Medicare Other

## 2019-07-25 ENCOUNTER — Encounter: Payer: Self-pay | Admitting: Internal Medicine

## 2019-07-25 VITALS — BP 118/62 | HR 134 | Ht 66.0 in | Wt 151.1 lb

## 2019-07-25 DIAGNOSIS — M79629 Pain in unspecified upper arm: Secondary | ICD-10-CM | POA: Diagnosis not present

## 2019-07-25 DIAGNOSIS — I10 Essential (primary) hypertension: Secondary | ICD-10-CM | POA: Diagnosis present

## 2019-07-25 DIAGNOSIS — N179 Acute kidney failure, unspecified: Secondary | ICD-10-CM | POA: Diagnosis not present

## 2019-07-25 DIAGNOSIS — E1165 Type 2 diabetes mellitus with hyperglycemia: Secondary | ICD-10-CM | POA: Diagnosis present

## 2019-07-25 DIAGNOSIS — R631 Polydipsia: Secondary | ICD-10-CM | POA: Insufficient documentation

## 2019-07-25 DIAGNOSIS — I482 Chronic atrial fibrillation, unspecified: Principal | ICD-10-CM | POA: Insufficient documentation

## 2019-07-25 DIAGNOSIS — R0602 Shortness of breath: Secondary | ICD-10-CM | POA: Diagnosis not present

## 2019-07-25 DIAGNOSIS — I4891 Unspecified atrial fibrillation: Secondary | ICD-10-CM | POA: Diagnosis not present

## 2019-07-25 DIAGNOSIS — E871 Hypo-osmolality and hyponatremia: Secondary | ICD-10-CM | POA: Insufficient documentation

## 2019-07-25 DIAGNOSIS — I5032 Chronic diastolic (congestive) heart failure: Secondary | ICD-10-CM | POA: Diagnosis not present

## 2019-07-25 DIAGNOSIS — R29898 Other symptoms and signs involving the musculoskeletal system: Secondary | ICD-10-CM | POA: Diagnosis not present

## 2019-07-25 DIAGNOSIS — M79646 Pain in unspecified finger(s): Secondary | ICD-10-CM | POA: Diagnosis not present

## 2019-07-25 DIAGNOSIS — F329 Major depressive disorder, single episode, unspecified: Secondary | ICD-10-CM | POA: Diagnosis not present

## 2019-07-25 DIAGNOSIS — K7030 Alcoholic cirrhosis of liver without ascites: Secondary | ICD-10-CM | POA: Diagnosis not present

## 2019-07-25 DIAGNOSIS — E860 Dehydration: Secondary | ICD-10-CM

## 2019-07-25 DIAGNOSIS — R739 Hyperglycemia, unspecified: Secondary | ICD-10-CM

## 2019-07-25 DIAGNOSIS — G40909 Epilepsy, unspecified, not intractable, without status epilepticus: Secondary | ICD-10-CM | POA: Insufficient documentation

## 2019-07-25 DIAGNOSIS — E119 Type 2 diabetes mellitus without complications: Secondary | ICD-10-CM | POA: Diagnosis present

## 2019-07-25 DIAGNOSIS — I130 Hypertensive heart and chronic kidney disease with heart failure and stage 1 through stage 4 chronic kidney disease, or unspecified chronic kidney disease: Secondary | ICD-10-CM | POA: Diagnosis not present

## 2019-07-25 DIAGNOSIS — E1159 Type 2 diabetes mellitus with other circulatory complications: Secondary | ICD-10-CM | POA: Diagnosis present

## 2019-07-25 DIAGNOSIS — E782 Mixed hyperlipidemia: Secondary | ICD-10-CM | POA: Diagnosis not present

## 2019-07-25 DIAGNOSIS — Z9889 Other specified postprocedural states: Secondary | ICD-10-CM | POA: Insufficient documentation

## 2019-07-25 DIAGNOSIS — Z7901 Long term (current) use of anticoagulants: Secondary | ICD-10-CM | POA: Diagnosis not present

## 2019-07-25 DIAGNOSIS — I4819 Other persistent atrial fibrillation: Secondary | ICD-10-CM

## 2019-07-25 DIAGNOSIS — Z79899 Other long term (current) drug therapy: Secondary | ICD-10-CM | POA: Insufficient documentation

## 2019-07-25 DIAGNOSIS — N189 Chronic kidney disease, unspecified: Secondary | ICD-10-CM | POA: Insufficient documentation

## 2019-07-25 DIAGNOSIS — R9082 White matter disease, unspecified: Secondary | ICD-10-CM | POA: Diagnosis not present

## 2019-07-25 DIAGNOSIS — R252 Cramp and spasm: Secondary | ICD-10-CM | POA: Insufficient documentation

## 2019-07-25 DIAGNOSIS — Z03818 Encounter for observation for suspected exposure to other biological agents ruled out: Secondary | ICD-10-CM | POA: Diagnosis not present

## 2019-07-25 DIAGNOSIS — Z20822 Contact with and (suspected) exposure to covid-19: Secondary | ICD-10-CM | POA: Diagnosis not present

## 2019-07-25 DIAGNOSIS — E1122 Type 2 diabetes mellitus with diabetic chronic kidney disease: Secondary | ICD-10-CM | POA: Diagnosis not present

## 2019-07-25 DIAGNOSIS — Z886 Allergy status to analgesic agent status: Secondary | ICD-10-CM | POA: Diagnosis not present

## 2019-07-25 DIAGNOSIS — Z87898 Personal history of other specified conditions: Secondary | ICD-10-CM

## 2019-07-25 DIAGNOSIS — R358 Other polyuria: Secondary | ICD-10-CM | POA: Diagnosis not present

## 2019-07-25 DIAGNOSIS — F10188 Alcohol abuse with other alcohol-induced disorder: Secondary | ICD-10-CM | POA: Diagnosis present

## 2019-07-25 DIAGNOSIS — IMO0002 Reserved for concepts with insufficient information to code with codable children: Secondary | ICD-10-CM | POA: Diagnosis present

## 2019-07-25 LAB — BASIC METABOLIC PANEL
Anion gap: 14 (ref 5–15)
BUN: 27 mg/dL — ABNORMAL HIGH (ref 8–23)
CO2: 25 mmol/L (ref 22–32)
Calcium: 9.8 mg/dL (ref 8.9–10.3)
Chloride: 87 mmol/L — ABNORMAL LOW (ref 98–111)
Creatinine, Ser: 1.73 mg/dL — ABNORMAL HIGH (ref 0.44–1.00)
GFR calc Af Amer: 35 mL/min — ABNORMAL LOW (ref >60)
GFR calc non Af Amer: 30 mL/min — ABNORMAL LOW (ref >60)
Glucose, Bld: 621 mg/dL (ref 70–99)
Potassium: 4.2 mmol/L (ref 3.5–5.1)
Sodium: 126 mmol/L — ABNORMAL LOW (ref 135–145)

## 2019-07-25 LAB — CBC
HCT: 40.3 % (ref 36.0–46.0)
Hemoglobin: 13.7 g/dL (ref 12.0–15.0)
MCH: 30.4 pg (ref 26.0–34.0)
MCHC: 34 g/dL (ref 30.0–36.0)
MCV: 89.4 fL (ref 80.0–100.0)
Platelets: 158 10*3/uL (ref 150–400)
RBC: 4.51 MIL/uL (ref 3.87–5.11)
RDW: 11.9 % (ref 11.5–15.5)
WBC: 7.8 10*3/uL (ref 4.0–10.5)
nRBC: 0 % (ref 0.0–0.2)

## 2019-07-25 LAB — MAGNESIUM: Magnesium: 2 mg/dL (ref 1.7–2.4)

## 2019-07-25 LAB — TROPONIN I (HIGH SENSITIVITY)
Troponin I (High Sensitivity): 7 ng/L (ref <18)
Troponin I (High Sensitivity): 7 ng/L (ref <18)

## 2019-07-25 LAB — GLUCOSE, CAPILLARY
Glucose-Capillary: 571 mg/dL (ref 70–99)
Glucose-Capillary: 390 mg/dL — ABNORMAL HIGH (ref 70–99)
Glucose-Capillary: 371 mg/dL — ABNORMAL HIGH (ref 70–99)

## 2019-07-25 IMAGING — CT CT HEAD W/O CM
3 series · 15 of 46 positions shown, 18 images · non-contrast
Comparison: None.

CLINICAL DATA: HPAUL, aneurysm repair [51]

EXAM:
CT HEAD WITHOUT CONTRAST
TECHNIQUE: Contiguous axial images were obtained from the base of the skull
through the vertex without intravenous contrast.

[Series 2: head wo · axial · 0.40mm/px · z∈[-31,+89]mm · 9 of 29 slices shown, 12 images]
[im 3/29  brain]
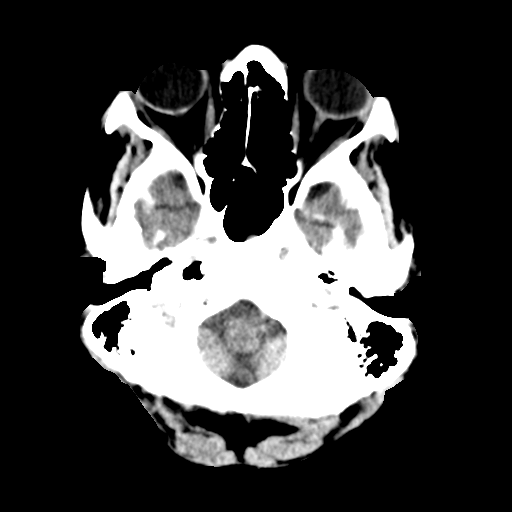
[im 3/29  bone]
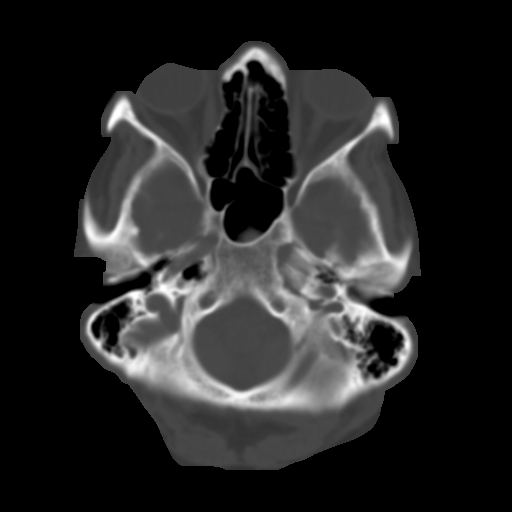
[im 6/29  brain]
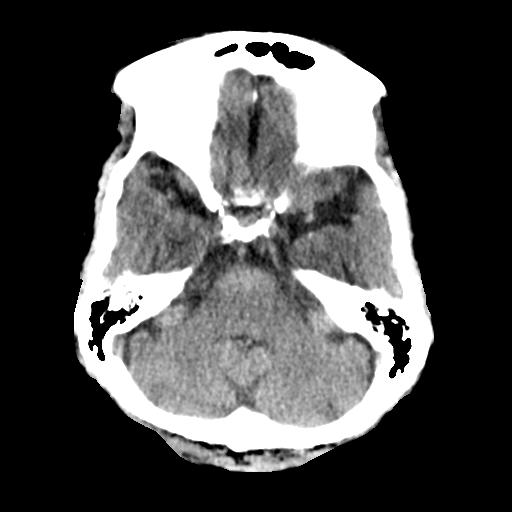
[im 9/29  brain]
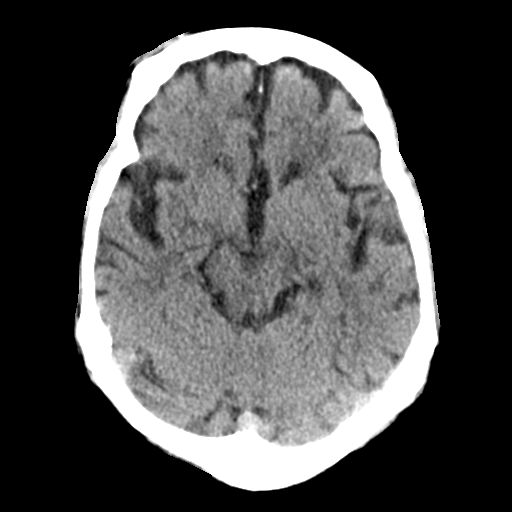
[im 12/29  brain]
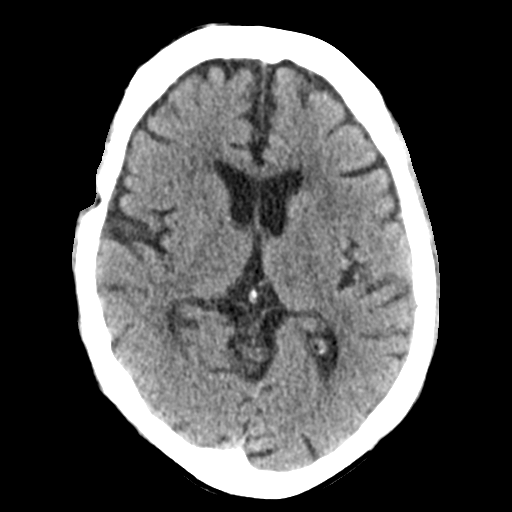
[im 15/29  brain]
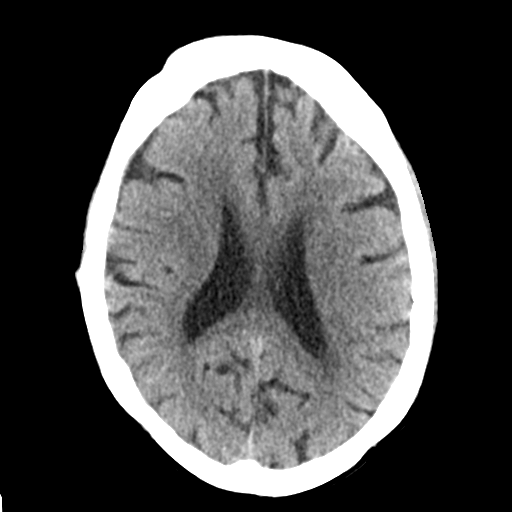
[im 15/29  bone]
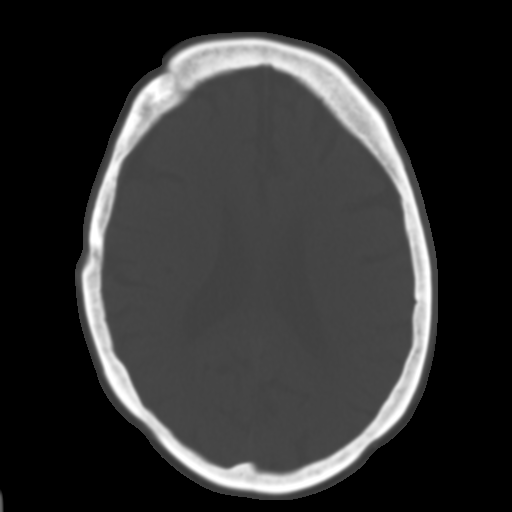
[im 18/29  brain]
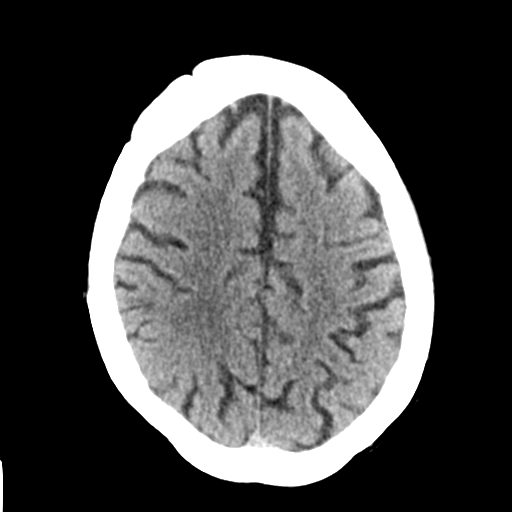
[im 21/29  brain]
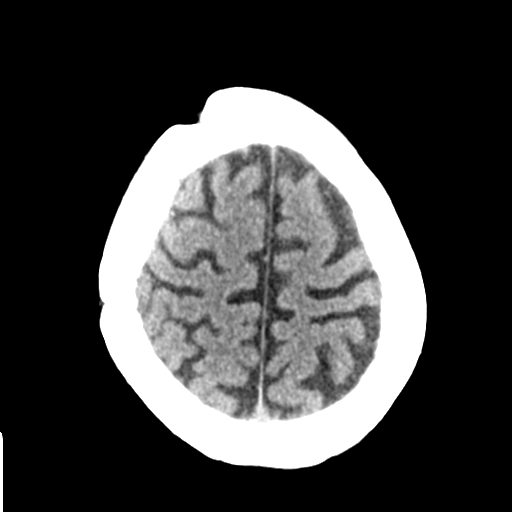
[im 24/29  brain]
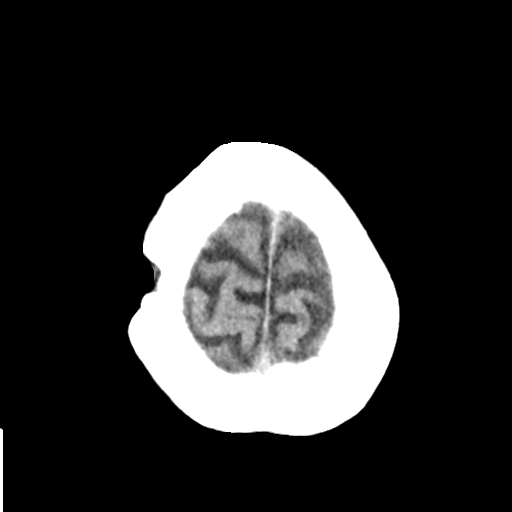
[im 27/29  brain]
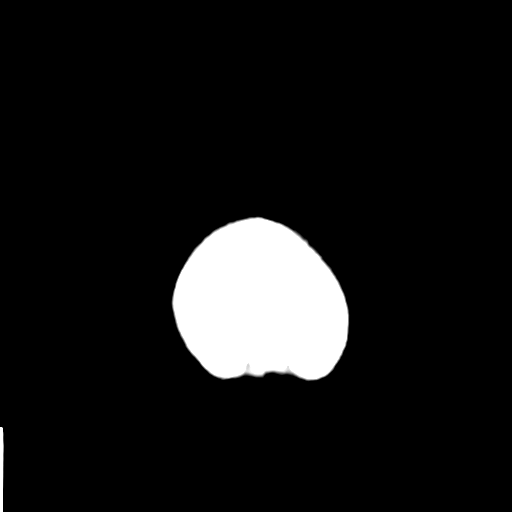
[im 27/29  bone]
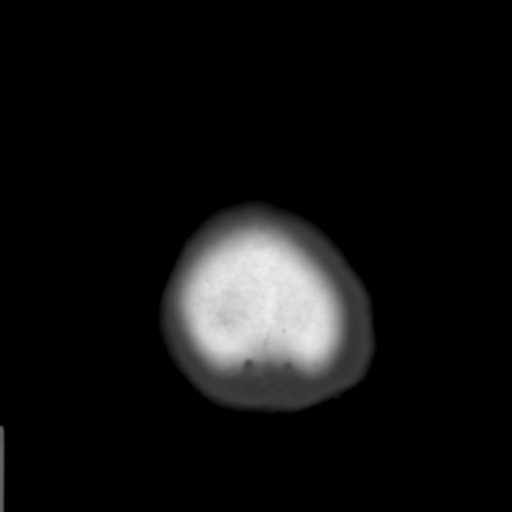

[Series 4: coronal soft tissue · coronal · 0.29mm/px · 3 of 64 slices shown]
[im 22/64  brain]
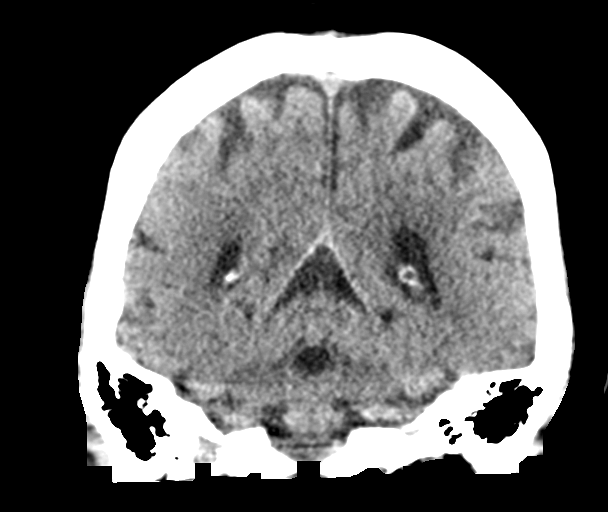
[im 29/64  brain]
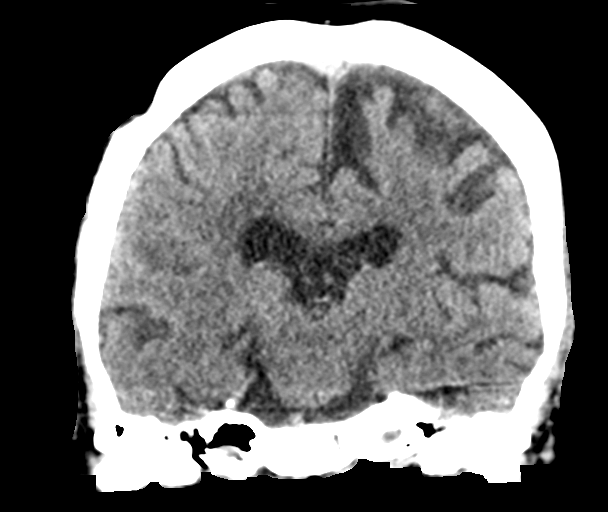
[im 36/64  brain]
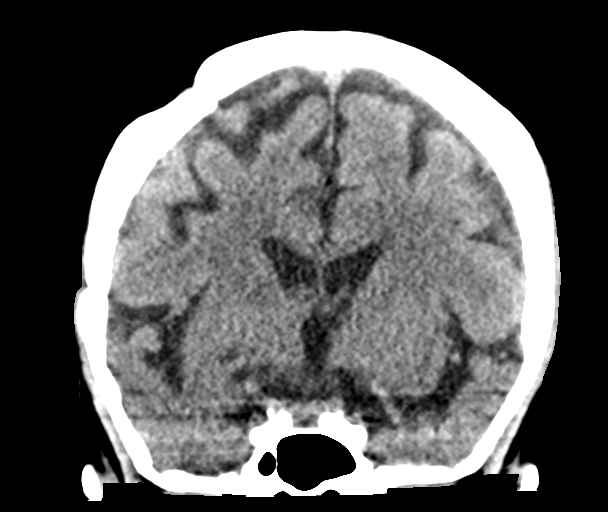

[Series 5: sagittal soft tissue · sagittal · 0.29mm/px · 3 of 59 slices shown]
[im 20/59  brain]
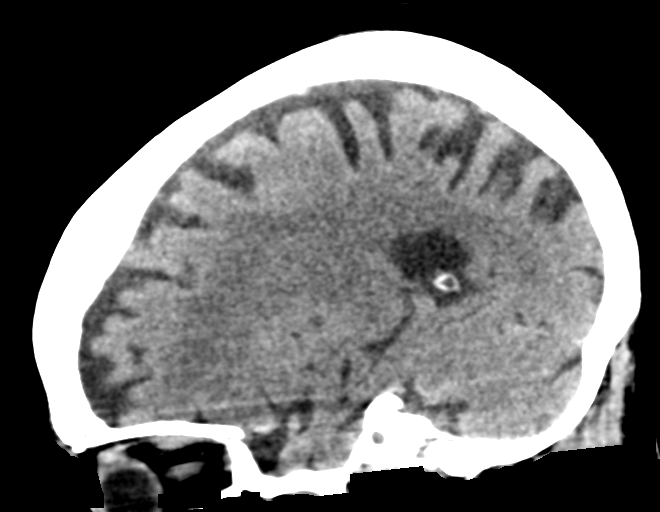
[im 30/59  brain]
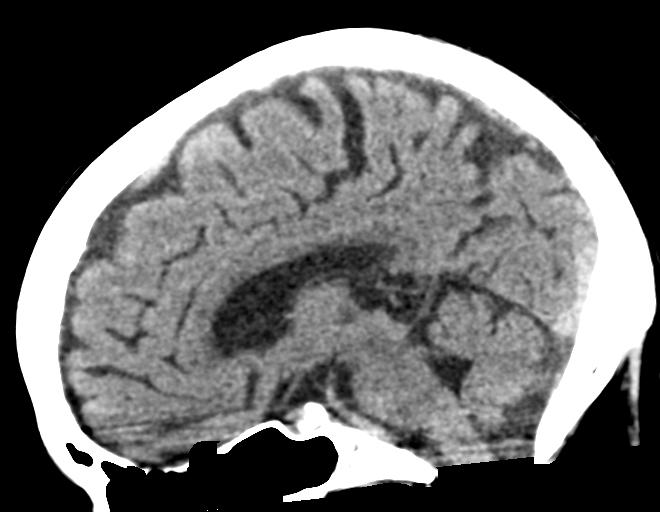
[im 39/59  brain]
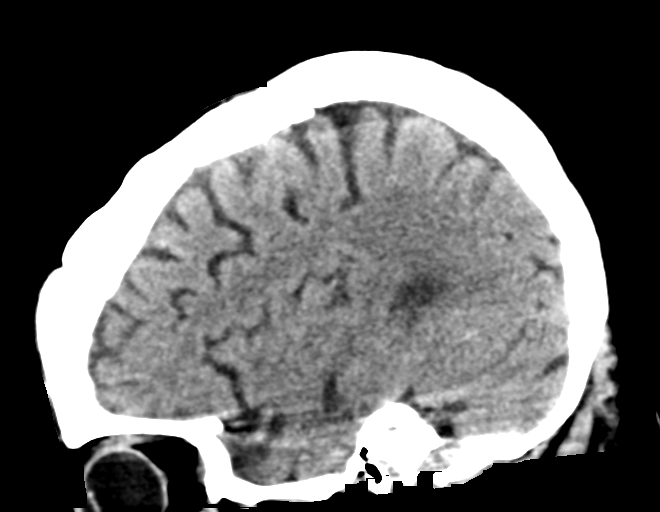

[15 of 46 positions shown; findings below may reference images not displayed]

FINDINGS: Brain: No evidence of acute infarction, hemorrhage, hydrocephalus,
extra-axial collection or mass lesion/mass effect. Mild
periventricular white matter hypodensity. Focal hypodensities of the
anterior limb of the right internal capsule and lentiform nuclei of
the left basal ganglia.

Vascular: No hyperdense vessel or unexpected calcification.

Skull: Status post right frontal craniotomy. Negative for fracture
or focal lesion.

Sinuses/Orbits: No acute finding.

Other: None.
IMPRESSION: 1.  No acute intracranial pathology.

2. Small-vessel white matter disease and small focal hypodensities
of the bilateral basal ganglia, consistent with small lacunar
infarctions, age indeterminate.

3.  Status post right frontal craniotomy.

## 2019-07-25 IMAGING — CR DG CHEST 2V
2 series · 2 of 2 positions shown · non-contrast
Comparison: [DATE]

CLINICAL DATA: Shortness of breath

EXAM:
CHEST - 2 VIEW

[chest pa]
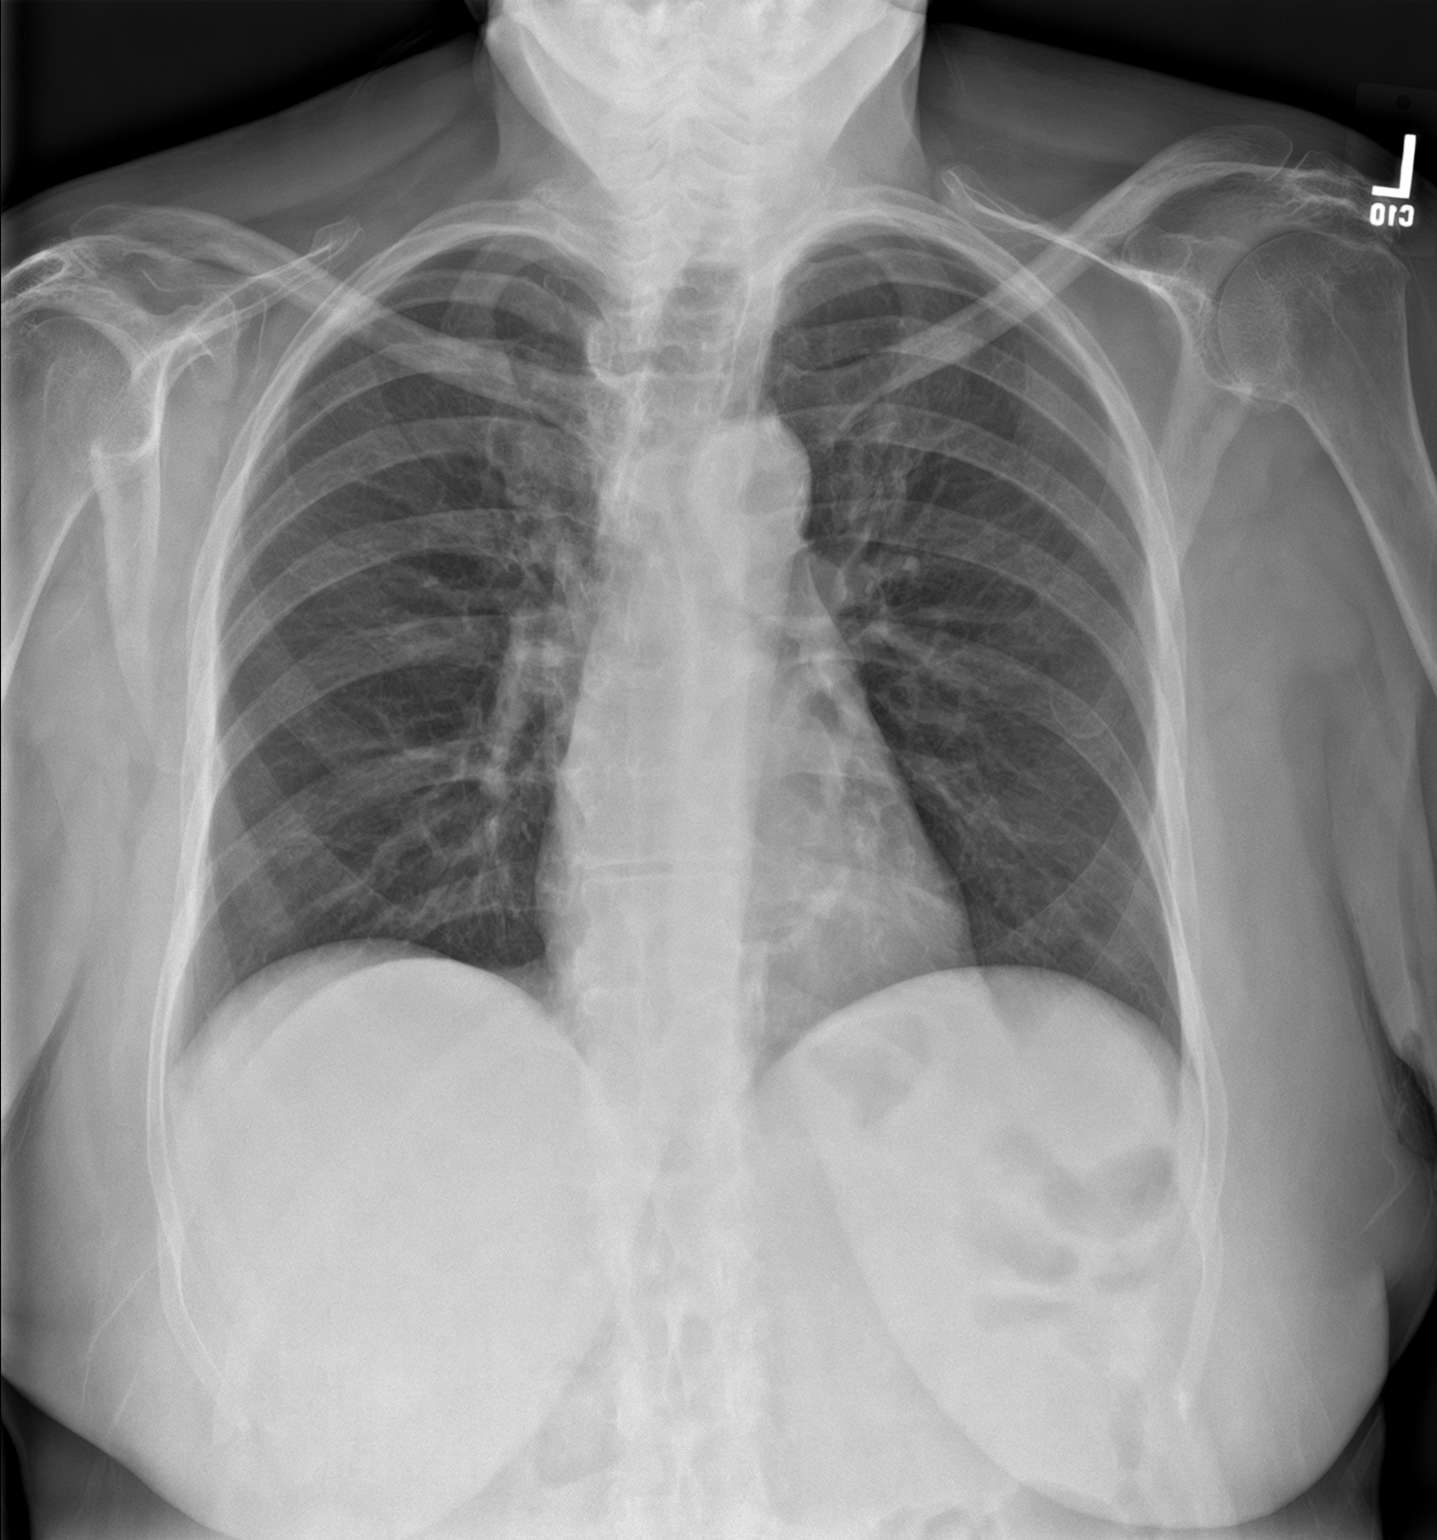

[chest lat]
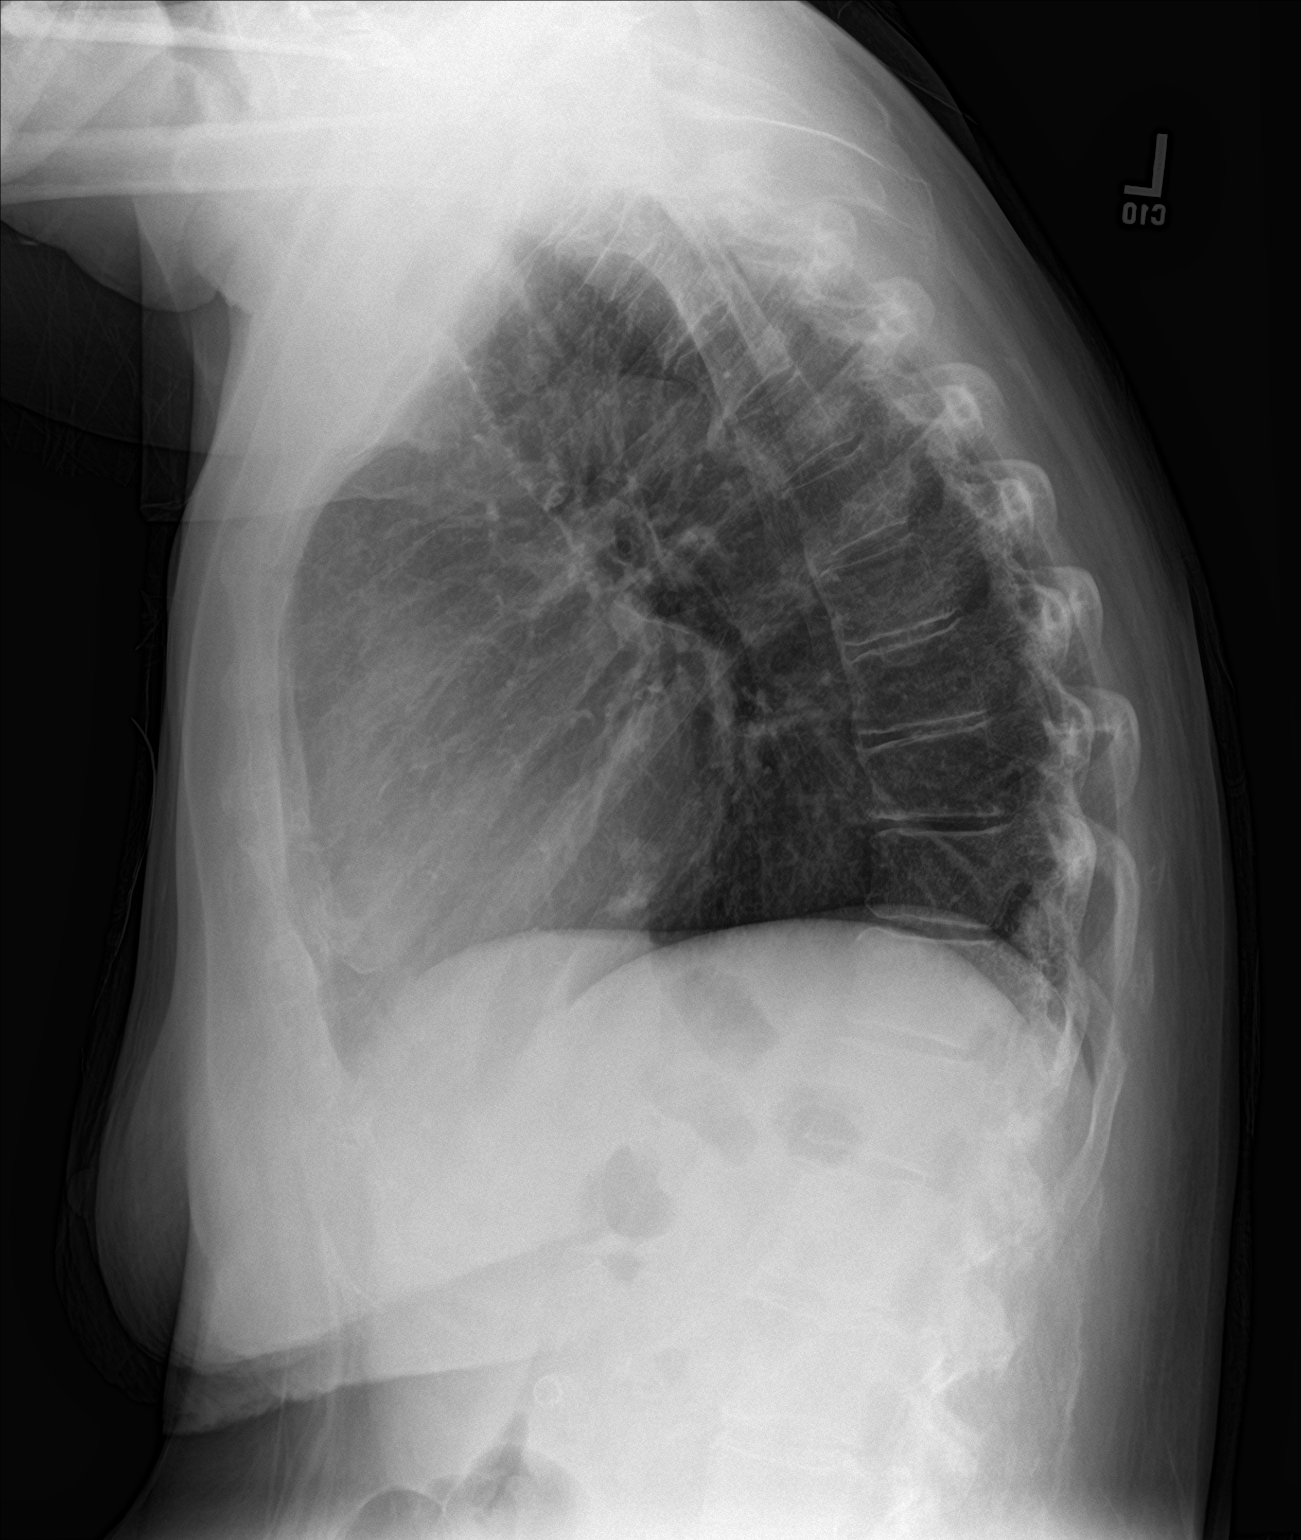

[2 of 2 positions shown; findings below may reference images not displayed]

FINDINGS: Heart is normal size. Mitral valve annular calcifications. Lungs
clear. No effusions or acute bony abnormality.
IMPRESSION: No active cardiopulmonary disease.

## 2019-07-25 MED ORDER — DILTIAZEM LOAD VIA INFUSION
15.0000 mg | Freq: Once | INTRAVENOUS | Status: AC
  Administered 2019-07-25: 15 mg via INTRAVENOUS
  Filled 2019-07-25: qty 15

## 2019-07-25 MED ORDER — SODIUM CHLORIDE 0.9 % IV BOLUS
500.0000 mL | Freq: Once | INTRAVENOUS | Status: AC
  Administered 2019-07-25: 500 mL via INTRAVENOUS

## 2019-07-25 MED ORDER — ESCITALOPRAM OXALATE 10 MG PO TABS
10.0000 mg | ORAL_TABLET | Freq: Every day | ORAL | Status: DC
  Administered 2019-07-25 – 2019-07-26 (×2): 10 mg via ORAL
  Filled 2019-07-25 (×2): qty 1

## 2019-07-25 MED ORDER — ALLOPURINOL 100 MG PO TABS
100.0000 mg | ORAL_TABLET | Freq: Every day | ORAL | Status: DC
  Administered 2019-07-26: 100 mg via ORAL
  Filled 2019-07-25 (×2): qty 1

## 2019-07-25 MED ORDER — SODIUM CHLORIDE 0.9 % IV BOLUS
2000.0000 mL | Freq: Once | INTRAVENOUS | Status: AC
  Administered 2019-07-25: 2000 mL via INTRAVENOUS

## 2019-07-25 MED ORDER — METOPROLOL TARTRATE 25 MG PO TABS
25.0000 mg | ORAL_TABLET | Freq: Every day | ORAL | Status: DC
  Administered 2019-07-25 – 2019-07-26 (×2): 25 mg via ORAL
  Filled 2019-07-25 (×2): qty 1

## 2019-07-25 MED ORDER — MIRTAZAPINE 15 MG PO TABS
7.5000 mg | ORAL_TABLET | Freq: Every day | ORAL | Status: DC
  Administered 2019-07-25: 7.5 mg via ORAL
  Filled 2019-07-25: qty 1

## 2019-07-25 MED ORDER — INSULIN ASPART 100 UNIT/ML ~~LOC~~ SOLN
8.0000 [IU] | Freq: Once | SUBCUTANEOUS | Status: AC
  Administered 2019-07-25: 8 [IU] via INTRAVENOUS
  Filled 2019-07-25: qty 1

## 2019-07-25 MED ORDER — SPIRONOLACTONE 25 MG PO TABS
25.0000 mg | ORAL_TABLET | Freq: Every day | ORAL | Status: DC
  Administered 2019-07-25 – 2019-07-26 (×2): 25 mg via ORAL
  Filled 2019-07-25 (×2): qty 1

## 2019-07-25 MED ORDER — DILTIAZEM HCL-DEXTROSE 125-5 MG/125ML-% IV SOLN (PREMIX)
5.0000 mg/h | INTRAVENOUS | Status: DC
  Administered 2019-07-25: 5 mg/h via INTRAVENOUS
  Filled 2019-07-25: qty 125

## 2019-07-25 MED ORDER — SODIUM CHLORIDE 0.9% FLUSH: 3.0000 mL | Freq: Once | INTRAVENOUS | Status: DC

## 2019-07-25 MED ORDER — DILTIAZEM HCL 60 MG PO TABS
60.0000 mg | ORAL_TABLET | Freq: Once | ORAL | Status: AC
  Administered 2019-07-25: 15:00:00 60 mg via ORAL
  Filled 2019-07-25: qty 1

## 2019-07-25 NOTE — ED Notes (Signed)
Pharmacy contacted to verify and send pt's medications.

## 2019-07-25 NOTE — ED Provider Notes (Signed)
[facility] Emergency Department Provider Note    First MD Initiated Contact with Patient 07/25/19 1458     (approximate)  I have reviewed the triage vital signs and the nursing notes.   HISTORY  Chief Complaint Afib RVR    HPI Adriana Miller is a 64 y.o. female with the below listed past medical history on Eliquis for history of A. fib presents from cardiology clinic due to tachycardia.  States she is been having left arm cramps for the past several days.  Feels like she also has dry mouth.  Feels dehydrated.  States she took all of her medications.  Denies any melena or hematochezia.  Denies any pain.  No shortness of breath.    Past Medical History:  Diagnosis Date  . B12 deficiency 04/05/2017  . Blood in stool   . Cerebral aneurysm   . Chronic kidney disease   . Diabetes mellitus without complication ([place])   . Gastric erosion with bleeding   . Hyperlipidemia   . Hypertension   . Lower extremity cellulitis 02/08/2019  . Seizure disorder ([place])    Family History  Problem Relation Age of Onset  . Heart attack Brother 87   Past Surgical History:  Procedure Laterality Date  . CEREBRAL ANEURYSM REPAIR  1991  . COLONOSCOPY WITH PROPOFOL N/A 02/12/2019   Procedure: COLONOSCOPY WITH PROPOFOL;  Surgeon: Lin Landsman, MD;  Location: [facility] ENDOSCOPY;  Service: Gastroenterology;  Laterality: N/A;  . ESOPHAGOGASTRODUODENOSCOPY (EGD) WITH PROPOFOL N/A 02/12/2019   Procedure: ESOPHAGOGASTRODUODENOSCOPY (EGD) WITH PROPOFOL;  Surgeon: Lin Landsman, MD;  Location: [facility] ENDOSCOPY;  Service: Gastroenterology;  Laterality: N/A;   Patient Active Problem List   Diagnosis Date Noted  . Left arm weakness 07/25/2019  . Hypotension 04/20/2019  . Alcoholic cirrhosis of liver without ascites ([place])   . Chronic heart failure with preserved ejection fraction (HFpEF) ([place]) 01/15/2019  . QT prolongation 01/15/2019  . Dyspnea on exertion 10/10/2018  .  Persistent atrial fibrillation ([place]) 10/10/2018  . Diastolic dysfunction 99991111  . Acute upper respiratory infection 09/07/2017  . Essential hypertension 09/07/2017  . Type 2 diabetes mellitus, uncontrolled ([place]) 04/05/2017  . Weakness 04/05/2017  . Mixed hyperlipidemia 04/05/2017  . Alcohol abuse with other alcohol-induced disorder ([place]) 04/05/2017  . History of seizure 05/20/2015  . Brain aneurysm 05/20/2015      Prior to Admission medications   Medication Sig Start Date End Date Taking? Authorizing Provider  allopurinol (ZYLOPRIM) 100 MG tablet Take 1 tablet (100 mg total) by mouth daily. 06/27/19   Earlie Server, MD  amLODipine (NORVASC) 5 MG tablet Take 5 mg by mouth daily. 05/11/19   [provider]  apixaban (ELIQUIS) 5 MG TABS tablet Take 1 tablet (5 mg total) by mouth 2 (two) times daily. 06/12/19   Loel Dubonnet, NP  diltiazem (CARDIZEM CD) 120 MG 24 hr capsule Take 1 capsule (120 mg total) by mouth daily. 02/13/19 06/05/28  Mayo, Pete Pelt, MD  escitalopram (LEXAPRO) 10 MG tablet Take 1 tablet (10 mg total) by mouth daily. For anxiety/sleep 03/20/19   Lavera Guise, MD  furosemide (LASIX) 20 MG tablet Take 20 mg by mouth daily.    [provider]  metoprolol tartrate (LOPRESSOR) 25 MG tablet Take 25 mg by mouth daily.    [provider]  mirtazapine (REMERON) 7.5 MG tablet Take 7.5 mg by mouth at bedtime.    [provider]  spironolactone (ALDACTONE) 25 MG tablet Take 1  tablet (25 mg total) by mouth daily. For cirrhosis of liver 06/19/19   Kendell Bane, NP    Allergies Aspirin    Social History Social History   Tobacco Use  . Smoking status: Never Smoker  . Smokeless tobacco: Current User    Types: Snuff  Substance Use Topics  . Alcohol use: Not Currently    Alcohol/week: 14.0 standard drinks    Types: 14 Cans of beer per week    Comment: patient quit in october 2020  . Drug use: No    Review of Systems Patient denies  headaches, rhinorrhea, blurry vision, numbness, shortness of breath, chest pain, edema, cough, abdominal pain, nausea, vomiting, diarrhea, dysuria, fevers, rashes or hallucinations unless otherwise stated above in HPI. ____________________________________________   PHYSICAL EXAM:  VITAL SIGNS: Vitals:   07/25/19 1545 07/25/19 1600  BP: (!) 146/85 133/85  Pulse: 92 (!) 101  Resp: (!) 21 20  Temp:    SpO2: 100% 96%    Constitutional: Alert and oriented.  Eyes: Conjunctivae are normal.  Head: Atraumatic. Nose: No congestion/rhinnorhea. Mouth/Throat: Mucous membranes are moist.   Neck: No stridor. Painless ROM.  Cardiovascular: irregularly irregular rhythm. Grossly normal heart sounds.  Good peripheral circulation. Respiratory: Normal respiratory effort.  No retractions. Lungs CTAB. Gastrointestinal: Soft and nontender. No distention. No abdominal bruits. No CVA tenderness. Genitourinary:  Musculoskeletal: No lower extremity tenderness nor edema.  No joint effusions. Neurologic:  Normal speech and language. No gross focal neurologic deficits are appreciated. No facial droop Skin:  Skin is warm, dry and intact. No rash noted. Psychiatric: Mood and affect are normal. Speech and behavior are normal.  ____________________________________________   LABS (all labs ordered are listed, but only abnormal results are displayed)  Results for orders placed or performed during the hospital encounter of 07/25/19 (from the past 24 hour(s))  Basic metabolic panel     Status: Abnormal   Collection Time: 07/25/19  2:36 PM  Result Value Ref Range   Sodium 126 (L) 135 - 145 mmol/L   Potassium 4.2 3.5 - 5.1 mmol/L   Chloride 87 (L) 98 - 111 mmol/L   CO2 25 22 - 32 mmol/L   Glucose, Bld 621 (HH) 70 - 99 mg/dL   BUN 27 (H) 8 - 23 mg/dL   Creatinine, Ser 1.73 (H) 0.44 - 1.00 mg/dL   Calcium 9.8 8.9 - 10.3 mg/dL   GFR calc non Af Amer 30 (L) >60 mL/min   GFR calc Af Amer 35 (L) >60 mL/min    Anion gap 14 5 - 15  CBC     Status: None   Collection Time: 07/25/19  2:36 PM  Result Value Ref Range   WBC 7.8 4.0 - 10.5 K/uL   RBC 4.51 3.87 - 5.11 MIL/uL   Hemoglobin 13.7 12.0 - 15.0 g/dL   HCT 40.3 36.0 - 46.0 %   MCV 89.4 80.0 - 100.0 fL   MCH 30.4 26.0 - 34.0 pg   MCHC 34.0 30.0 - 36.0 g/dL   RDW 11.9 11.5 - 15.5 %   Platelets 158 150 - 400 K/uL   nRBC 0.0 0.0 - 0.2 %  Troponin I (High Sensitivity)     Status: None   Collection Time: 07/25/19  2:36 PM  Result Value Ref Range   Troponin I (High Sensitivity) 7 <18 ng/L  Magnesium     Status: None   Collection Time: 07/25/19  2:36 PM  Result Value Ref Range   Magnesium 2.0 1.7 -  2.4 mg/dL  Glucose, capillary     Status: Abnormal   Collection Time: 07/25/19  3:55 PM  Result Value Ref Range   Glucose-Capillary 571 (HH) 70 - 99 mg/dL   Comment 1 Document in Chart    ____________________________________________  EKG My review and personal interpretation at Time: 14:32   Indication: afib  Rate: 140  Rhythm: afib rvr Axis: normal Other: normal intervals, no stemi ____________________________________________  RADIOLOGY  I personally reviewed all radiographic images ordered to evaluate for the above acute complaints and reviewed radiology reports and findings.  These findings were personally discussed with the patient.  Please see medical record for radiology report.  ____________________________________________   PROCEDURES  Procedure(s) performed:  .1-3 Lead EKG Interpretation Performed by: Merlyn Lot, MD Authorized by: Merlyn Lot, MD     Interpretation: abnormal     ECG rate:  135   ECG rate assessment: tachycardic     Rhythm: atrial fibrillation     Ectopy: none     Conduction: normal    .Critical Care Performed by: Merlyn Lot, MD Authorized by: Merlyn Lot, MD   Critical care provider statement:    Critical care time (minutes):  35   Critical care time was exclusive of:   Separately billable procedures and treating other patients   Critical care was necessary to treat or prevent imminent or life-threatening deterioration of the following conditions:  Circulatory failure   Critical care was time spent personally by me on the following activities:  Development of treatment plan with patient or surrogate, discussions with consultants, evaluation of patient's response to treatment, examination of patient, obtaining history from patient or surrogate, ordering and performing treatments and interventions, ordering and review of laboratory studies, ordering and review of radiographic studies, pulse oximetry, re-evaluation of patient's condition and review of old charts      Critical Care performed: yes ____________________________________________   INITIAL IMPRESSION / ASSESSMENT AND PLAN / ED COURSE  Pertinent labs & imaging results that were available during my care of the patient were reviewed by me and considered in my medical decision making (see chart for details).   DDX: dysrhythmia, dehydration, afib, electrolyte abn, chf, acs  SHIWANI MAURIN is a 64 y.o. who presents to the ED with symptoms as described above.  Patient with A. fib with RVR.  On my exam I do not note any significant neuro deficit.  May be some trace left-sided weakness.  She does clinically appear dehydrated.  Will give IV fluids.  Telemetry shows persistent A. fib she is normotensive and already anticoagulated.  Will obtain rate control with IV Cardizem.  The patient will be placed on continuous pulse oximetry and telemetry for monitoring.  Laboratory evaluation will be sent to evaluate for the above complaints.     Clinical Course as of Jul 25 1702  Wed Jul 25, 2019  1648 CT imaging does show some chronic microvascular changes.  She has stabilized on Cardizem infusion and has received oral Cardizem as well.  Glucose is downtrending.  We will continue with IV fluids.  Disinfection component  of this is from dehydration.  Will discuss with hospitalist for admission.   [PR]    Clinical Course User Index [PR] Merlyn Lot, MD    The patient was evaluated in Emergency Department today for the symptoms described in the history of present illness. He/she was evaluated in the context of the global COVID-19 pandemic, which necessitated consideration that the patient might be at risk for infection  with the SARS-CoV-2 virus that causes COVID-19. Institutional protocols and algorithms that pertain to the evaluation of patients at risk for COVID-19 are in a state of rapid change based on information released by regulatory bodies including the CDC and federal and state organizations. These policies and algorithms were followed during the patient's care in the ED.  As part of my medical decision making, I reviewed the following data within the [facility] notes reviewed and incorporated, Labs reviewed, notes from prior ED visits and [place] Controlled Substance Database   ____________________________________________   FINAL CLINICAL IMPRESSION(S) / ED DIAGNOSES  Final diagnoses:  Atrial fibrillation with rapid ventricular response (HCC)  Dehydration  Hyperglycemia      NEW MEDICATIONS STARTED DURING THIS VISIT:  New Prescriptions   No medications on file     Note:  This document was prepared using Dragon voice recognition software and may include unintentional dictation errors.    Merlyn Lot, MD 07/25/19 1704

## 2019-07-25 NOTE — ED Notes (Signed)
Pharmacy contacted about pt's medications.

## 2019-07-25 NOTE — ED Notes (Signed)
Pt taken to xray 

## 2019-07-25 NOTE — ED Notes (Signed)
Pt taken to CT.

## 2019-07-25 NOTE — Progress Notes (Signed)
Follow-up Outpatient Visit Date: 07/25/2019  Primary Care Provider: Ronnell Freshwater, NP [address] [place] [address]  Chief Complaint: Left arm cramps  HPI:  Adriana Miller is a 64 y.o. female with history of persistent atrial fibrillation and atrial flutter, hypertension, hyperlipidemia, diabetes mellitus, chronic kidney disease, cirrhosis complicated by GI bleed in 01/2019, seizure disorder, and cerebral aneurysm status post repair, who presents for follow-up of atrial fibrillation.  She was last seen in our office a month ago by Marrianne Mood, PA.  She was found to be in atrial flutter at routine follow-up visit in February.  She was started on metoprolol tartrate 12.5 mg twice daily and also continued on diltiazem 120 mg daily.  Apixaban was increased to 5 mg twice daily for appropriate dosing.  Adriana Miller was noted to be back in sinus rhythm at her follow-up visit last month.  Today, Adriana Miller reports that her left arm has been cramping and weak at times.  This began about 3 days ago and is intermittently present.  At times, she has been unable to hold things.  Occasionally, her arm will shake uncontrollably for up to a couple of minutes.  She denies similar symptoms in the past when she has had seizures.  She reports being compliant with her medications but wonders if she may be on too many medications and that they are causing her left arm symptoms.  She denies chest pain, shortness of breath, palpitations, lightheadedness, and edema.  She has felt off balance but has not fallen.  She denies any bleeding, remaining on apixaban 5 mg twice daily.  --------------------------------------------------------------------------------------------------  Past Medical History:  Diagnosis Date  . B12 deficiency 04/05/2017  . Blood in stool   . Cerebral aneurysm   . Chronic kidney disease   . Diabetes mellitus without complication ([place])   . Gastric erosion with bleeding   .  Hyperlipidemia   . Hypertension   . Lower extremity cellulitis 02/08/2019  . Seizure disorder [facility])    Past Surgical History:  Procedure Laterality Date  . CEREBRAL ANEURYSM REPAIR  1991  . COLONOSCOPY WITH PROPOFOL N/A 02/12/2019   Procedure: COLONOSCOPY WITH PROPOFOL;  Surgeon: Lin Landsman, MD;  Location: [facility] ENDOSCOPY;  Service: Gastroenterology;  Laterality: N/A;  . ESOPHAGOGASTRODUODENOSCOPY (EGD) WITH PROPOFOL N/A 02/12/2019   Procedure: ESOPHAGOGASTRODUODENOSCOPY (EGD) WITH PROPOFOL;  Surgeon: Lin Landsman, MD;  Location: [facility] ENDOSCOPY;  Service: Gastroenterology;  Laterality: N/A;    Current Meds  Medication Sig  . allopurinol (ZYLOPRIM) 100 MG tablet Take 1 tablet (100 mg total) by mouth daily.  Marland Kitchen amLODipine (NORVASC) 5 MG tablet Take 5 mg by mouth daily.  Marland Kitchen apixaban (ELIQUIS) 5 MG TABS tablet Take 1 tablet (5 mg total) by mouth 2 (two) times daily.  Marland Kitchen diltiazem (CARDIZEM CD) 120 MG 24 hr capsule Take 1 capsule (120 mg total) by mouth daily.  Marland Kitchen escitalopram (LEXAPRO) 10 MG tablet Take 1 tablet (10 mg total) by mouth daily. For anxiety/sleep  . furosemide (LASIX) 20 MG tablet Take 20 mg by mouth daily.  . metoprolol tartrate (LOPRESSOR) 25 MG tablet Take 25 mg by mouth daily.  . mirtazapine (REMERON) 7.5 MG tablet Take 7.5 mg by mouth at bedtime.  Marland Kitchen spironolactone (ALDACTONE) 25 MG tablet Take 1 tablet (25 mg total) by mouth daily. For cirrhosis of liver    Allergies: Aspirin  Social History   Tobacco Use  . Smoking status: Never Smoker  . Smokeless tobacco: Current User  Types: Snuff  Substance Use Topics  . Alcohol use: Not Currently    Alcohol/week: 14.0 standard drinks    Types: 14 Cans of beer per week    Comment: patient quit in october 2020  . Drug use: No    Family History  Problem Relation Age of Onset  . Heart attack Brother 69    Review of Systems: A 12-system review of systems was performed and was negative except as noted in the  HPI.  --------------------------------------------------------------------------------------------------  Physical Exam: BP 118/62 (BP Location: Right Arm, Patient Position: Sitting, Cuff Size: Normal)   Pulse (!) 134   Ht 5\' 6"  (1.676 m)   Wt 151 lb 2 oz (68.5 kg)   SpO2 98%   BMI 24.39 kg/m   General: NAD. Neck: No JVD or HJR. Lungs: Clear to auscultation bilateral without wheezes or crackles. Heart: Irregularly irregular and tachycardic without murmurs, rubs, or gallops. Abdomen: Soft, nontender, nondistended. Extremities: No lower extremity edema. Neuro: Cranial nerves III through XII intact.  There is 4/5 right upper and lower extremity strength.  Left upper and lower extremity strength is 4-/5.  EKG: Atrial fibrillation with rapid ventricular response and nonspecific ST/T changes.  Atrial fibrillation has replaced sinus rhythm compared with prior tracing from 06/22/2019.  Lab Results  Component Value Date   WBC 5.2 06/27/2019   HGB 11.7 (L) 06/27/2019   HCT 36.7 06/27/2019   MCV 95.1 06/27/2019   PLT 132 (L) 06/27/2019    Lab Results  Component Value Date   NA 134 (L) 06/27/2019   K 4.3 06/27/2019   CL 100 06/27/2019   CO2 24 06/27/2019   BUN 24 (H) 06/27/2019   CREATININE 1.12 (H) 06/27/2019   GLUCOSE 318 (H) 06/27/2019   ALT 16 06/27/2019    Lab Results  Component Value Date   CHOL 134 01/27/2018   HDL 80 01/27/2018   LDLCALC 42 01/27/2018   TRIG 60 01/27/2018    --------------------------------------------------------------------------------------------------  ASSESSMENT AND PLAN: Left arm weakness/cramps: Examination today is notable for subtle asymmetric weakness on the left side.  Given that the patient has a history of paroxysmal atrial fibrillation and is in atrial fibrillation today, albeit on anticoagulation, I am concerned that her symptoms may reflect a recent stroke.  She also carries the diagnosis of seizure disorder, raising the possibility  of a partial seizure.  I have recommended that she be taken to the emergency department for further evaluation.  As symptoms have been present for several days and she is on anticoagulation, she would not be a candidate for TPA.  Persistent atrial fibrillation: Adriana Miller is an atrial fibrillation with rapid ventricular response today.  She reports being compliant with metoprolol and diltiazem, as well as anticoagulation with apixaban.  I think it would be reasonable to escalate rate control while in the ED/hospital.  I would favor discontinuing amlodipine such that metoprolol and/or diltiazem can be increased.  Cirrhosis and chronic HFpEF: Adriana Miller does not appear volume overloaded.  Continue current regimen of furosemide and spironolactone, as well as follow-up with hepatology.  Follow-up: To be determined based on ED visit/hospital course.  Nelva Bush, MD 07/25/2019 1:46 PM

## 2019-07-25 NOTE — ED Notes (Signed)
Pt comfortable at this time. Cardizem drip d/c and metoprolol given. Heat increased per pt request.

## 2019-07-25 NOTE — ED Notes (Addendum)
Date and time results received: 07/25/19  15:33  Test: Glucose, Bld Critical Value: 621 mg/dl   Name of Provider Notified: Dr Quentin Cornwall, MD  See order for 540ml normal saline

## 2019-07-25 NOTE — H&P (Addendum)
History and Physical:    Adriana Miller   [id number] DOB: 1956-04-08 DOA: 07/25/2019  Referring MD/provider: Dr. Quentin Cornwall PCP: Ronnell Freshwater, NP   Patient coming from: Home  Chief Complaint: Left arm spasming for 3 days, referred from cardiology clinic.  History of Present Illness:   Adriana Miller is an 64 y.o. 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  Dr. Saunders Revel was concerned about stroke so sent her to the ED.  Patient tells me she has felt fine except for the spasming in her arms.  She describes this as pain in her upper arm which radiates down to her fingers.  Her second fingers cramp up painfully and this resolves when she extends her arm at the elbow and hyperextends her wrist and her fingers.  This then resolves the cramp.  However the cramping has been coming on intermittently for 3 days and she is not sure why.  Patient admits to complete dietary indiscretion in regards to sugar.  Patient states she has been eating a lot of donuts, drinking a lot of soda and ice tea.  Patient states she used to stay away from sugar but has decided recently that she did not want to do that anymore.  Patient denies any alcohol use in over a year.  States that when she started bleeding it really frightened her so she stopped drinking alcohol.  Of note she was in house October 2020 with portal gastropathy and GI bleeding.  Patient denies any chest pain.  She was not aware that her heart rate was running fast.  She denies palpitations dizziness syncope or presyncope.  Other than her arm she generally feels pretty well.  Review of systems is positive for polyuria and polydipsia.  States she is very thirsty in her throat feels dry.  As  noted above no chest pain or palpitations.  No fevers chills shortness of breath cough or any other intercurrent illness.  ED Course:   In the ED patient was noted to be in A. fib with RVR.  Heart rate of 150.  She had no chest pain or shortness of breath.  Patient was also noted to be significantly hyperglycemic with initial sugar in the mid 600s with no DKA.  She also had hyponatremia.  She was treated with 1 L bolus.   ROS:   ROS   Review of Systems: As per HPI  Past Medical History:   Past Medical History:  Diagnosis Date  . B12 deficiency 04/05/2017  . Blood in stool   . Cerebral aneurysm   . Chronic kidney disease   . Diabetes mellitus without complication ([place])   . Gastric erosion with bleeding   . Hyperlipidemia   . Hypertension   . Lower extremity cellulitis 02/08/2019  . Seizure disorder [facility])     Past Surgical History:   Past Surgical History:  Procedure Laterality Date  . CEREBRAL ANEURYSM REPAIR  1991  . COLONOSCOPY WITH PROPOFOL N/A 02/12/2019   Procedure: COLONOSCOPY WITH PROPOFOL;  Surgeon: Lin Landsman, MD;  Location: [facility] ENDOSCOPY;  Service: Gastroenterology;  Laterality: N/A;  . ESOPHAGOGASTRODUODENOSCOPY (EGD) WITH PROPOFOL N/A 02/12/2019   Procedure: ESOPHAGOGASTRODUODENOSCOPY (EGD) WITH PROPOFOL;  Surgeon: Lin Landsman, MD;  Location: [facility] ENDOSCOPY;  Service: Gastroenterology;  Laterality:  N/A;    Social History:   Social History   Socioeconomic History  . Marital status: Divorced    Spouse name: Not on file  . Number of children: Not on file  . Years of education: Not on file  . Highest education level: Not on file  Occupational History  . Not on file  Tobacco Use  . Smoking status: Never Smoker  . Smokeless tobacco: Current User    Types: Snuff  Substance and Sexual Activity  . Alcohol use: Not Currently    Alcohol/week: 14.0 standard drinks    Types: 14 Cans of beer per week    Comment: patient quit in october 2020   . Drug use: No  . Sexual activity: Not Currently  Other Topics Concern  . Not on file  Social History Narrative  . Not on file   Social Determinants of Health   Financial Resource Strain:   . Difficulty of Paying Living Expenses:   Food Insecurity:   . Worried About Charity fundraiser in the Last Year:   . Arboriculturist in the Last Year:   Transportation Needs:   . Film/video editor (Medical):   Marland Kitchen Lack of Transportation (Non-Medical):   Physical Activity:   . Days of Exercise per Week:   . Minutes of Exercise per Session:   Stress:   . Feeling of Stress :   Social Connections:   . Frequency of Communication with Friends and Family:   . Frequency of Social Gatherings with Friends and Family:   . Attends Religious Services:   . Active Member of Clubs or Organizations:   . Attends Archivist Meetings:   Marland Kitchen Marital Status:   Intimate Partner Violence:   . Fear of Current or Ex-Partner:   . Emotionally Abused:   Marland Kitchen Physically Abused:   . Sexually Abused:     Allergies   Aspirin  Family history:   Family History  Problem Relation Age of Onset  . Heart attack Brother 61    Current Medications:   Prior to Admission medications   Medication Sig Start Date End Date Taking? Authorizing Provider  allopurinol (ZYLOPRIM) 100 MG tablet Take 1 tablet (100 mg total) by mouth daily. 06/27/19   Earlie Server, MD  amLODipine (NORVASC) 5 MG tablet Take 5 mg by mouth daily. 05/11/19   [provider]  apixaban (ELIQUIS) 5 MG TABS tablet Take 1 tablet (5 mg total) by mouth 2 (two) times daily. 06/12/19   Loel Dubonnet, NP  diltiazem (CARDIZEM CD) 120 MG 24 hr capsule Take 1 capsule (120 mg total) by mouth daily. 02/13/19 06/05/28  Mayo, Pete Pelt, MD  escitalopram (LEXAPRO) 10 MG tablet Take 1 tablet (10 mg total) by mouth daily. For anxiety/sleep 03/20/19   Lavera Guise, MD  furosemide (LASIX) 20 MG tablet Take 20 mg by mouth daily.    [provider]   metoprolol tartrate (LOPRESSOR) 25 MG tablet Take 25 mg by mouth daily.    [provider]  mirtazapine (REMERON) 7.5 MG tablet Take 7.5 mg by mouth at bedtime.    [provider]  spironolactone (ALDACTONE) 25 MG tablet Take 1 tablet (25 mg total) by mouth daily. For cirrhosis of liver 06/19/19   Kendell Bane, NP    Physical Exam:   Vitals:   07/25/19 1545 07/25/19 1600 07/25/19 1715 07/25/19 1730  BP: (!) 146/85 133/85 (!) 122/59 114/74  Pulse: 92 (!) 101 66  76  Resp: (!) 21 20    Temp:      SpO2: 100% 96% 100% 99%  Weight:      Height:         Physical Exam: Blood pressure 114/74, pulse 76, temperature 98.4 F (36.9 C), resp. rate 20, height 5\' 6"  (1.676 m), weight 68.6 kg, SpO2 99 %. Gen: Patient with exophthalmos left greater than right lying in bed in no acute distress, friendly and cooperative. Eyes: sclera anicteric, conjuctiva mildly injected bilaterally patient has significant exophthalmos especially in her left eye with lid lag.  EOMI.  Mucous membranes are dry. CVS: S1-S2, irregularly irregular no gallops or murmurs  respiratory: Clear to auscultation with reasonable air entry bilaterally.  No wheezes.  No rales GI: NABS, soft, NT, no ascites. LE: No edema. No cyanosis Neuro: A/O x 3, Moving all extremities equally with normal strength, CN 3-12 intact, grossly nonfocal.  Psych: patient is logical and coherent, judgement and insight appear poor and somewhat childlike, mood and affect somewhat unconcerned. Skin: no rashes or lesions or ulcers,    Data Review:    Labs: Basic Metabolic Panel: Recent Labs  Lab 07/25/19 1436  NA 126*  K 4.2  CL 87*  CO2 25  GLUCOSE 621*  BUN 27*  CREATININE 1.73*  CALCIUM 9.8  MG 2.0   Liver Function Tests: No results for input(s): AST, ALT, ALKPHOS, BILITOT, PROT, ALBUMIN in the last 168 hours. No results for input(s): LIPASE, AMYLASE in the last 168 hours. No results for input(s): AMMONIA in the last  168 hours. CBC: Recent Labs  Lab 07/25/19 1436  WBC 7.8  HGB 13.7  HCT 40.3  MCV 89.4  PLT 158   Cardiac Enzymes: No results for input(s): CKTOTAL, CKMB, CKMBINDEX, TROPONINI in the last 168 hours.  BNP (last 3 results) No results for input(s): PROBNP in the last 8760 hours. CBG: Recent Labs  Lab 07/25/19 1555 07/25/19 1708  GLUCAP 571* 390*    Urinalysis    Component Value Date/Time   COLORURINE Yellow 10/11/2013 1705   APPEARANCEUR Hazy 10/11/2013 1705   LABSPEC 1.012 10/11/2013 1705   PHURINE 6.0 10/11/2013 1705   GLUCOSEU >=500 10/11/2013 1705   HGBUR 2+ 10/11/2013 1705   BILIRUBINUR Negative 10/11/2013 1705   KETONESUR Negative 10/11/2013 1705   PROTEINUR 100 mg/dL 10/11/2013 1705   NITRITE Negative 10/11/2013 1705   LEUKOCYTESUR 1+ 10/11/2013 1705      Radiographic Studies: DG Chest 2 View  Result Date: 07/25/2019 CLINICAL DATA:  Shortness of breath EXAM: CHEST - 2 VIEW COMPARISON:  02/08/2019 FINDINGS: Heart is normal size. Mitral valve annular calcifications. Lungs clear. No effusions or acute bony abnormality. IMPRESSION: No active cardiopulmonary disease. Electronically Signed   By: Rolm Baptise M.D.   On: 07/25/2019 15:12   CT Head Wo Contrast  Result Date: 07/25/2019 CLINICAL DATA:  TAA, aneurysm repair 1991 EXAM: CT HEAD WITHOUT CONTRAST TECHNIQUE: Contiguous axial images were obtained from the base of the skull through the vertex without intravenous contrast. COMPARISON:  None. FINDINGS: Brain: No evidence of acute infarction, hemorrhage, hydrocephalus, extra-axial collection or mass lesion/mass effect. Mild periventricular white matter hypodensity. Focal hypodensities of the anterior limb of the right internal capsule and lentiform nuclei of the left basal ganglia. Vascular: No hyperdense vessel or unexpected calcification. Skull: Status post right frontal craniotomy. Negative for fracture or focal lesion. Sinuses/Orbits: No acute finding. Other: None.  IMPRESSION: 1.  No acute intracranial pathology. 2. Small-vessel white matter disease  and small focal hypodensities of the bilateral basal ganglia, consistent with small lacunar infarctions, age indeterminate. 3.  Status post right frontal craniotomy. Electronically Signed   By: Eddie Candle M.D.   On: 07/25/2019 16:44    EKG: Independently reviewed.  Atrial fibrillation at 135.  T wave inversions in 3 only.   Assessment/Plan:   Principal Problem:   Atrial fibrillation with RVR (HCC) Active Problems:   History of seizure   Type 2 diabetes mellitus, uncontrolled (HCC)   Alcohol abuse with other alcohol-induced disorder ([place])   Essential hypertension   Chronic heart failure with preserved ejection fraction (HFpEF) ([place])  64 year old female is sent from cardiology for left arm spasms.  In ED she is noted to be in A. fib with RVR and have marked hyperglycemia with blood sugar in the 600s.   A. fib with RVR Patient was placed on Cardizem drip in ED with improved control of heart rate. We will continue Cardizem drip, can be titrated off and onto oral Cardizem overnight as warranted. Per Dr. Darnelle Bos note, I have discontinued amlodipine, and I have continued metoprolol 25 daily. I have not continue diltiazem 120 on the med rec as it might need to be titrated up tomorrow morning.   Diltiazem dosing needs to be addressed in the morning depending on how patient does overnight. Eliquis restarted. Of note patient does have HFpEF so tachycardia could potentially throw her into decompensated heart. failure/pulmonary edema however at present lungs are clear and patient denies any shortness of breath.  Hyperglycemia Blood sugars down to the high 300s. Patient is not in DKA with a normal bicarb. Will treat sugar aggressively with high-dose SSI. Fluid resuscitation will also improve her sugar.  Hyponatremia Should improve with IV fluid resuscitation, recheck in the morning  AKI Likely secondary  to intravascular volume depletion from hyperglycemia Will treat with IV fluids and recheck in the morning.  Left arm spasms Patient seems to have left arm spasms that do not seem in any way central to make given improvement with extension at elbow wrist and fingers. Very likely secondary to profound dehydration secondary to persistent hyperglycemia. We will treat sodium and fluids and follow symptoms  Seizure disorder secondary to cerebral aneurysm status post repair with craniotomy. As noted above I do not believe left arm concerns are central. Continue seizure meds once meds are reconciled.  HFpEF Compensated Continue spironolactone  I am holding Lasix for now given intravascular volume depletion.  Cirrhosis Appears to be compensated, patient has no peripheral edema or ascites. History of GI bleed secondary to portal gastropathy. Vernard Gambles is on Eliquis at present without evidence of acute bleeding.  Depression Mirtazapine and Lexapro have been restarted Follow QTC    Other information:   DVT prophylaxis: On Eliquis ordered. Code Status: Full Family Communication: Patient said no need to call anybody Disposition Plan: Home Consults called: None Admission status: Observation  Eleonor Ocon Tublu Kaylamarie Swickard [facility]  If 7PM-7AM, please contact night-coverage [email or web address] Password [email or web address] 07/25/2019, 5:50 PM

## 2019-07-25 NOTE — Patient Instructions (Signed)
We are taking you to the Emergency Room today for evaluation of atrial fibrillation with RVR and possible stroke.   Medication Instructions:  Your physician recommends that you continue on your current medications as directed. Please refer to the Current Medication list given to you today.  *If you need a refill on your cardiac medications before your next appointment, please call your pharmacy*   Follow-Up: At [facility], you and your health needs are our priority.  As part of our continuing mission to provide you with exceptional heart care, we have created designated Provider Care Teams.  These Care Teams include your primary Cardiologist (physician) and Advanced Practice Providers (APPs -  Physician Assistants and Nurse Practitioners) who all work together to provide you with the care you need, when you need it.  We recommend signing up for the patient portal called "MyChart".  Sign up information is provided on this After Visit Summary.  MyChart is used to connect with patients for Virtual Visits (Telemedicine).  Patients are able to view lab/test results, encounter notes, upcoming appointments, etc.  Non-urgent messages can be sent to your provider as well.   To learn more about what you can do with MyChart, go to [email or web address].    Your next appointment:   To be determined.  The format for your next appointment:   In Person  Provider:    You may see Nelva Bush, MD or one of the following Advanced Practice Providers on your designated Care Team:    Murray Hodgkins, NP  Christell Faith, PA-C  Marrianne Mood, PA-C

## 2019-07-25 NOTE — ED Triage Notes (Addendum)
Pt sent over from [facility] from Dr. Saunders Revel with c/o Afib with RVR. Pt states she was there for a check up.  Pt denies any CP. Pt states some throat dryness and SOB. Pt states numbness to left arm. Pt states it gave out on her for a minute.

## 2019-07-26 ENCOUNTER — Ambulatory Visit: Payer: Medicare Other | Admitting: Oncology

## 2019-07-26 DIAGNOSIS — I482 Chronic atrial fibrillation, unspecified: Secondary | ICD-10-CM | POA: Diagnosis not present

## 2019-07-26 LAB — COMPREHENSIVE METABOLIC PANEL
ALT: 14 U/L (ref 0–44)
AST: 21 U/L (ref 15–41)
Albumin: 2.8 g/dL — ABNORMAL LOW (ref 3.5–5.0)
Alkaline Phosphatase: 75 U/L (ref 38–126)
Anion gap: 8 (ref 5–15)
BUN: 20 mg/dL (ref 8–23)
CO2: 22 mmol/L (ref 22–32)
Calcium: 8.7 mg/dL — ABNORMAL LOW (ref 8.9–10.3)
Chloride: 102 mmol/L (ref 98–111)
Creatinine, Ser: 1.2 mg/dL — ABNORMAL HIGH (ref 0.44–1.00)
GFR calc Af Amer: 55 mL/min — ABNORMAL LOW (ref >60)
GFR calc non Af Amer: 47 mL/min — ABNORMAL LOW (ref >60)
Glucose, Bld: 312 mg/dL — ABNORMAL HIGH (ref 70–99)
Potassium: 3.8 mmol/L (ref 3.5–5.1)
Sodium: 132 mmol/L — ABNORMAL LOW (ref 135–145)
Total Bilirubin: 1.3 mg/dL — ABNORMAL HIGH (ref 0.3–1.2)
Total Protein: 7.6 g/dL (ref 6.5–8.1)

## 2019-07-26 LAB — CBC
HCT: 32.7 % — ABNORMAL LOW (ref 36.0–46.0)
Hemoglobin: 11.2 g/dL — ABNORMAL LOW (ref 12.0–15.0)
MCH: 30.9 pg (ref 26.0–34.0)
MCHC: 34.3 g/dL (ref 30.0–36.0)
MCV: 90.1 fL (ref 80.0–100.0)
Platelets: 141 10*3/uL — ABNORMAL LOW (ref 150–400)
RBC: 3.63 MIL/uL — ABNORMAL LOW (ref 3.87–5.11)
RDW: 12.2 % (ref 11.5–15.5)
WBC: 6.3 10*3/uL (ref 4.0–10.5)
nRBC: 0 % (ref 0.0–0.2)

## 2019-07-26 LAB — GLUCOSE, CAPILLARY
Glucose-Capillary: 296 mg/dL — ABNORMAL HIGH (ref 70–99)
Glucose-Capillary: 371 mg/dL — ABNORMAL HIGH (ref 70–99)
Glucose-Capillary: 249 mg/dL — ABNORMAL HIGH (ref 70–99)

## 2019-07-26 LAB — SARS CORONAVIRUS 2 (TAT 6-24 HRS): SARS Coronavirus 2: NEGATIVE

## 2019-07-26 MED ORDER — METFORMIN HCL 500 MG PO TABS: 500.0000 mg | ORAL_TABLET | Freq: Every day | ORAL | 0 refills | Status: DC

## 2019-07-26 MED ORDER — INSULIN ASPART 100 UNIT/ML ~~LOC~~ SOLN
0.0000 [IU] | Freq: Three times a day (TID) | SUBCUTANEOUS | Status: DC
  Administered 2019-07-26: 15 [IU] via SUBCUTANEOUS
  Administered 2019-07-26: 8 [IU] via SUBCUTANEOUS
  Filled 2019-07-26 (×2): qty 1

## 2019-07-26 MED ORDER — GLIMEPIRIDE 1 MG PO TABS: 1.0000 mg | ORAL_TABLET | ORAL | 0 refills | Status: DC

## 2019-07-26 MED ORDER — ACETAMINOPHEN 650 MG RE SUPP: 650.0000 mg | Freq: Four times a day (QID) | RECTAL | Status: DC | PRN

## 2019-07-26 MED ORDER — APIXABAN 5 MG PO TABS
5.0000 mg | ORAL_TABLET | Freq: Two times a day (BID) | ORAL | Status: DC
  Administered 2019-07-26: 5 mg via ORAL
  Filled 2019-07-26: qty 1

## 2019-07-26 MED ORDER — SODIUM CHLORIDE 0.9 % IV SOLN: INTRAVENOUS | Status: DC

## 2019-07-26 MED ORDER — DILTIAZEM HCL ER COATED BEADS 240 MG PO CP24: 240.0000 mg | ORAL_CAPSULE | Freq: Every day | ORAL | 11 refills | Status: DC

## 2019-07-26 MED ORDER — POLYETHYLENE GLYCOL 3350 17 G PO PACK: 17.0000 g | PACK | Freq: Every day | ORAL | Status: DC | PRN

## 2019-07-26 MED ORDER — ACETAMINOPHEN 325 MG PO TABS: 650.0000 mg | ORAL_TABLET | Freq: Four times a day (QID) | ORAL | Status: DC | PRN

## 2019-07-26 NOTE — Progress Notes (Signed)
Repeat BG 249, MD notified, order to d/c patient. IV removed from patient. Discharge instructions given to patient. Verbalized understanding. No acute distress at this time. [facility] called for transportation. No answer at this time, will attempt again.

## 2019-07-26 NOTE — TOC Initial Note (Signed)
Transition of Care [facility]) - Progression Note    Patient Details  Name: ARMANDO ULLERY MRN: [id number] Date of Birth: 01-15-1956  Transition of Care [facility]) CM/SW [place], RN Phone Number: 07/26/2019, 12:01 PM  Clinical Narrative:    Damaris Schooner with [facility], ED RN concerning transportation for patient to get home. Patient arrived via [facility] and is currently ready for discharge home. RN CM advised ED RN to utilize taxi services if patient is steady to walk from taxi to front door. Advised if patient not deemed steady to ambulate from taxi to front door of home to notify RN CM and we could work out another option. No other needs at this time. Patient has been seen by diabetic coordinator.         Expected Discharge Plan and Services                                                 Social Determinants of Health (SDOH) Interventions    Readmission Risk Interventions No flowsheet data found.

## 2019-07-26 NOTE — TOC Transition Note (Addendum)
Transition of Care [facility]) - CM/SW Discharge Note   Patient Details  Name: Adriana Miller MRN: [id number] Date of Birth: 1955/11/24  Transition [facility]) CM/SW Contact:  Ova Freshwater Phone Number: [phone] 07/26/2019, 4:16 PM   Clinical Narrative:     [place] for patient Transport [phone], pick up ETA 45 minutes.  The driver will contact ED Secretary w/ conformation number and driver name.  They will pick the patient up by the ED entrance.  This CSW notified by [facility] RN/ED, patient will be transported by [facility] transport via DSS request.  [facility] cancelled by this CSW.         Patient Goals and CMS Choice        Discharge Placement                       Discharge Plan and Services                                     Social Determinants of Health (SDOH) Interventions     Readmission Risk Interventions No flowsheet data found.

## 2019-07-26 NOTE — Progress Notes (Signed)
Inpatient Diabetes Program Recommendations  AACE/ADA: New Consensus Statement on Inpatient Glycemic Control (2015)  Target Ranges:  Prepandial:   less than 140 mg/dL      Peak postprandial:   less than 180 mg/dL (1-2 hours)      Critically ill patients:  140 - 180 mg/dL   Lab Results  Component Value Date   GLUCAP 296 (H) 07/26/2019   HGBA1C 5.3 10/09/2018    Review of Glycemic Control Results for Adriana Miller, Adriana Miller (MRN [id number]) as of 07/26/2019 10:30  Ref. Range 07/25/2019 15:55 07/25/2019 17:08 07/25/2019 20:41 07/26/2019 08:18  Glucose-Capillary Latest Ref Range: 70 - 99 mg/dL 571 (HH) 390 (H) 371 (H) 296 (H)   Diabetes history: DM2 Outpatient Diabetes medications: None listed Current orders for Inpatient glycemic control: Novolog moderate correction tid          Inpatient Diabetes Program Recommendations:   -Levemir 5 units bid (0.15 units/kg x 68.6 kg) -Decrease Novolog correction to sensitive 0-9  tid + hs 0-5 Noted pending A1c.  Thank you, Nani Gasser. Verble Styron, RN, MSN, CDE  Diabetes Coordinator Inpatient Glycemic Control Team Team Pager [phone] (8am-5pm) 07/26/2019 10:32 AM

## 2019-07-26 NOTE — Progress Notes (Signed)
Patients BG 371, MD notified, orders to hold patient for a few hours and recheck and if BG trending down 350 or less, ok to d/c. Consulted with care manager about patient needing transportation. Patient to be provided with taxi voucher when ready for discharge.

## 2019-07-26 NOTE — Progress Notes (Signed)
DSS contacted and [facility] arranged for transportation as [facility] taxi was unavailable. CM and SW aware.

## 2019-07-26 NOTE — Progress Notes (Signed)
Patient currently resting comfortably on stretcher, no c/o pain, VSS. Appears to be in sinus rhythm HR in 70s.

## 2019-07-27 ENCOUNTER — Inpatient Hospital Stay: Payer: Medicare Other | Admitting: Oncology

## 2019-07-27 LAB — HEMOGLOBIN A1C
Hgb A1c MFr Bld: 11.6 % — ABNORMAL HIGH (ref 4.8–5.6)
Mean Plasma Glucose: 286 mg/dL

## 2019-07-31 ENCOUNTER — Telehealth: Payer: Self-pay

## 2019-07-31 ENCOUNTER — Inpatient Hospital Stay: Payer: Medicare Other | Admitting: Oncology

## 2019-07-31 NOTE — Telephone Encounter (Signed)
Patient did not come for appt today that was rescheduled from 4/9.  Message left on voicemail to contact office to get appt r/s.

## 2019-07-31 NOTE — Telephone Encounter (Signed)
Called lmom/lmom informing patient of appointment on 08/02/2019. klh

## 2019-08-01 ENCOUNTER — Telehealth: Payer: Self-pay

## 2019-08-01 ENCOUNTER — Telehealth: Payer: Self-pay | Admitting: *Deleted

## 2019-08-01 NOTE — Telephone Encounter (Signed)
EVAL SIGNED AND PLACED IN [facility].

## 2019-08-02 ENCOUNTER — Ambulatory Visit (INDEPENDENT_AMBULATORY_CARE_PROVIDER_SITE_OTHER): Payer: Medicare Other | Admitting: Adult Health

## 2019-08-02 ENCOUNTER — Encounter: Payer: Self-pay | Admitting: Adult Health

## 2019-08-02 ENCOUNTER — Other Ambulatory Visit: Payer: Self-pay

## 2019-08-02 VITALS — BP 129/63 | HR 72 | Temp 97.7°F | Resp 16 | Ht 66.0 in | Wt 157.0 lb

## 2019-08-02 DIAGNOSIS — E1140 Type 2 diabetes mellitus with diabetic neuropathy, unspecified: Secondary | ICD-10-CM

## 2019-08-02 DIAGNOSIS — G2581 Restless legs syndrome: Secondary | ICD-10-CM

## 2019-08-02 DIAGNOSIS — E860 Dehydration: Secondary | ICD-10-CM | POA: Diagnosis not present

## 2019-08-02 DIAGNOSIS — M10222 Drug-induced gout, left elbow: Secondary | ICD-10-CM | POA: Diagnosis not present

## 2019-08-02 DIAGNOSIS — E1165 Type 2 diabetes mellitus with hyperglycemia: Secondary | ICD-10-CM | POA: Diagnosis not present

## 2019-08-02 DIAGNOSIS — IMO0002 Reserved for concepts with insufficient information to code with codable children: Secondary | ICD-10-CM

## 2019-08-02 MED ORDER — ALLOPURINOL 100 MG PO TABS: 100.0000 mg | ORAL_TABLET | Freq: Every day | ORAL | 1 refills | Status: DC

## 2019-08-02 MED ORDER — GLIMEPIRIDE 1 MG PO TABS: 1.0000 mg | ORAL_TABLET | ORAL | 0 refills | Status: DC

## 2019-08-02 MED ORDER — MIRTAZAPINE 7.5 MG PO TABS: 7.5000 mg | ORAL_TABLET | Freq: Every day | ORAL | 1 refills | Status: DC

## 2019-08-02 MED ORDER — METFORMIN HCL 500 MG PO TABS: 500.0000 mg | ORAL_TABLET | Freq: Every day | ORAL | 0 refills | Status: DC

## 2019-08-02 NOTE — Progress Notes (Signed)
[facility] [place], [place] [address]  Internal MEDICINE  Office Visit Note  Patient Name: Adriana Miller  [id number]  [id number]  Date of Service: 08/17/2019     Chief Complaint  Patient presents with  . Hospitalization Follow-up  . Diabetes     HPI Pt is here for recent hospital follow up. She reports on 07/25/19 she went to [facility] clinic to see Dr. Saunders Revel. He sent her to the ED for tachycardia and left arm pain.  She was observed in the ED overnight, and sent home the next day.  She was started on glimepiride and metformin at that time.    Current Medication: Outpatient Encounter Medications as of 08/02/2019  Medication Sig Note  . allopurinol (ZYLOPRIM) 100 MG tablet Take 1 tablet (100 mg total) by mouth daily.   Marland Kitchen apixaban (ELIQUIS) 5 MG TABS tablet Take 1 tablet (5 mg total) by mouth 2 (two) times daily.   Marland Kitchen diltiazem (CARTIA XT) 240 MG 24 hr capsule Take 1 capsule (240 mg total) by mouth daily.   Marland Kitchen escitalopram (LEXAPRO) 10 MG tablet Take 1 tablet (10 mg total) by mouth daily. For anxiety/sleep   . furosemide (LASIX) 20 MG tablet Take 20 mg by mouth daily.   Marland Kitchen glimepiride (AMARYL) 1 MG tablet Take 1 tablet (1 mg total) by mouth every morning.   . metFORMIN (GLUCOPHAGE) 500 MG tablet Take 1 tablet (500 mg total) by mouth daily with breakfast.   . metoprolol tartrate (LOPRESSOR) 25 MG tablet Take 25 mg by mouth daily.   . mirtazapine (REMERON) 7.5 MG tablet Take 1 tablet (7.5 mg total) by mouth at bedtime.   Marland Kitchen spironolactone (ALDACTONE) 25 MG tablet Take 1 tablet (25 mg total) by mouth daily. For cirrhosis of liver   . [DISCONTINUED] allopurinol (ZYLOPRIM) 100 MG tablet Take 1 tablet (100 mg total) by mouth daily. 07/25/2019: Pt not sure if she has started this, filled 06/27/2019 30 days  . [DISCONTINUED] glimepiride (AMARYL) 1 MG tablet Take 1 tablet (1 mg total) by mouth every morning.   . [DISCONTINUED] metFORMIN (GLUCOPHAGE) 500 MG tablet Take 1 tablet  (500 mg total) by mouth daily with breakfast.   . [DISCONTINUED] mirtazapine (REMERON) 7.5 MG tablet Take 7.5 mg by mouth at bedtime.    No facility-administered encounter medications on file as of 08/02/2019.    Surgical History: Past Surgical History:  Procedure Laterality Date  . CEREBRAL ANEURYSM REPAIR  1991  . COLONOSCOPY WITH PROPOFOL N/A 02/12/2019   Procedure: COLONOSCOPY WITH PROPOFOL;  Surgeon: Lin Landsman, MD;  Location[facility] ENDOSCOPY;  Service: Gastroenterology;  Laterality: N/A;  . ESOPHAGOGASTRODUODENOSCOPY (EGD) WITH PROPOFOL N/A 02/12/2019   Procedure: ESOPHAGOGASTRODUODENOSCOPY (EGD) WITH PROPOFOL;  Surgeon: Lin Landsman, MD;  Location: [facility] ENDOSCOPY;  Service: Gastroenterology;  Laterality: N/A;    Medical History: Past Medical History:  Diagnosis Date  . B12 deficiency 04/05/2017  . Blood in stool   . Cerebral aneurysm   . Chronic kidney disease   . Diabetes mellitus without complication ([place])   . Gastric erosion with bleeding   . Hyperlipidemia   . Hypertension   . Lower extremity cellulitis 02/08/2019  . Seizure disorder [facility])     Family History: Family History  Problem Relation Age of Onset  . Heart attack Brother 92    Social History   Socioeconomic History  . Marital status: Divorced    Spouse name: Not on file  . Number of children: Not  on file  . Years of education: Not on file  . Highest education level: Not on file  Occupational History  . Not on file  Tobacco Use  . Smoking status: Never Smoker  . Smokeless tobacco: Current User    Types: Snuff  Substance and Sexual Activity  . Alcohol use: Not Currently    Alcohol/week: 14.0 standard drinks    Types: 14 Cans of beer per week    Comment: patient quit in october 2020  . Drug use: No  . Sexual activity: Not Currently  Other Topics Concern  . Not on file  Social History Narrative  . Not on file   Social Determinants of Health   Financial Resource Strain:   .  Difficulty of Paying Living Expenses:   Food Insecurity:   . Worried About Charity fundraiser in the Last Year:   . Arboriculturist in the Last Year:   Transportation Needs:   . Film/video editor (Medical):   Marland Kitchen Lack of Transportation (Non-Medical):   Physical Activity:   . Days of Exercise per Week:   . Minutes of Exercise per Session:   Stress:   . Feeling of Stress :   Social Connections:   . Frequency of Communication with Friends and Family:   . Frequency of Social Gatherings with Friends and Family:   . Attends Religious Services:   . Active Member of Clubs or Organizations:   . Attends Archivist Meetings:   Marland Kitchen Marital Status:   Intimate Partner Violence:   . Fear of Current or Ex-Partner:   . Emotionally Abused:   Marland Kitchen Physically Abused:   . Sexually Abused:       Review of Systems  Constitutional: Negative for chills, fatigue and unexpected weight change.  HENT: Negative for congestion, rhinorrhea, sneezing and sore throat.   Eyes: Negative for photophobia, pain and redness.  Respiratory: Negative for cough, chest tightness and shortness of breath.   Cardiovascular: Negative for chest pain and palpitations.  Gastrointestinal: Negative for abdominal pain, constipation, diarrhea, nausea and vomiting.  Endocrine: Negative.   Genitourinary: Negative for dysuria and frequency.  Musculoskeletal: Negative for arthralgias, back pain, joint swelling and neck pain.  Skin: Negative for rash.  Allergic/Immunologic: Negative.   Neurological: Negative for tremors and numbness.  Hematological: Negative for adenopathy. Does not bruise/bleed easily.  Psychiatric/Behavioral: Negative for behavioral problems and sleep disturbance. The patient is not nervous/anxious.     Vital Signs: BP 129/63   Pulse 72   Temp 97.7 F (36.5 C)   Resp 16   Ht 5\' 6"  (1.676 m)   Wt 157 lb (71.2 kg)   SpO2 98%   BMI 25.34 kg/m    Physical Exam Vitals and nursing note reviewed.   Constitutional:      General: She is not in acute distress.    Appearance: She is well-developed. She is not diaphoretic.  HENT:     Head: Normocephalic and atraumatic.     Mouth/Throat:     Pharynx: No oropharyngeal exudate.  Eyes:     Pupils: Pupils are equal, round, and reactive to light.  Neck:     Thyroid: No thyromegaly.     Vascular: No JVD.     Trachea: No tracheal deviation.  Cardiovascular:     Rate and Rhythm: Normal rate and regular rhythm.     Heart sounds: Normal heart sounds. No murmur. No friction rub. No gallop.   Pulmonary:  Effort: Pulmonary effort is normal. No respiratory distress.     Breath sounds: Normal breath sounds. No wheezing or rales.  Chest:     Chest wall: No tenderness.  Abdominal:     Palpations: Abdomen is soft.     Tenderness: There is no abdominal tenderness. There is no guarding.  Musculoskeletal:        General: Normal range of motion.     Cervical back: Normal range of motion and neck supple.  Lymphadenopathy:     Cervical: No cervical adenopathy.  Skin:    General: Skin is warm and dry.  Neurological:     Mental Status: She is alert and oriented to person, place, and time.     Cranial Nerves: No cranial nerve deficit.  Psychiatric:        Behavior: Behavior normal.        Thought Content: Thought content normal.        Judgment: Judgment normal.     Assessment/Plan: 1. DM type 2, uncontrolled, with neuropathy (HCC) Continue metformin, and amaryl.  Discussed dietary changes and lifestyle modifications.  Follow up on A1C in 3 months.  - metFORMIN (GLUCOPHAGE) 500 MG tablet; Take 1 tablet (500 mg total) by mouth daily with breakfast.  Dispense: 90 tablet; Refill: 0 - glimepiride (AMARYL) 1 MG tablet; Take 1 tablet (1 mg total) by mouth every morning.  Dispense: 90 tablet; Refill: 0  2. 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  - allopurinol (ZYLOPRIM) 100 MG tablet; Take 1 tablet  (100 mg total) by mouth daily.  Dispense: 90 tablet; Refill: 1  3. Dehydration Resolved, continue to drink water, and take DM meds.   4. Restless leg Refilled Remeron at this time.  - mirtazapine (REMERON) 7.5 MG tablet; Take 1 tablet (7.5 mg total) by mouth at bedtime.  Dispense: 90 tablet; Refill: 1  General Counseling: Yaslene verbalizes understanding of the findings of todays visit and agrees with plan of treatment. I have discussed any further diagnostic evaluation that may be needed or ordered today. We also reviewed her medications today. she has been encouraged to call the office with any questions or concerns that should arise related to todays visit.    No orders of the defined types were placed in this encounter.     I have reviewed all medical records from hospital follow up including radiology reports and consults from other physicians. Appropriate follow up diagnostics will be scheduled as needed. Patient/ Family understands the plan of treatment.   Time spent 30 minutes.   Orson Gear AGNP-C Internal Medicine

## 2019-08-06 ENCOUNTER — Telehealth: Payer: Self-pay

## 2019-08-06 NOTE — Telephone Encounter (Signed)
Spoke with Abran Duke in regards to DME. Stated she did not receive the paperwork so refaxed to [phone].

## 2019-08-06 NOTE — Telephone Encounter (Signed)
Completed Forms for home services, faxed and mailed and put copy in scan. Beth

## 2019-08-07 ENCOUNTER — Other Ambulatory Visit: Payer: Self-pay

## 2019-08-07 ENCOUNTER — Encounter: Payer: Self-pay | Admitting: Oncology

## 2019-08-07 ENCOUNTER — Inpatient Hospital Stay: Payer: Medicare Other | Attending: Oncology | Admitting: Oncology

## 2019-08-07 VITALS — BP 128/65 | HR 65 | Temp 97.0°F | Resp 16

## 2019-08-07 DIAGNOSIS — I10 Essential (primary) hypertension: Secondary | ICD-10-CM | POA: Diagnosis not present

## 2019-08-07 DIAGNOSIS — N189 Chronic kidney disease, unspecified: Secondary | ICD-10-CM | POA: Diagnosis not present

## 2019-08-07 DIAGNOSIS — E538 Deficiency of other specified B group vitamins: Secondary | ICD-10-CM | POA: Insufficient documentation

## 2019-08-07 DIAGNOSIS — G40909 Epilepsy, unspecified, not intractable, without status epilepticus: Secondary | ICD-10-CM | POA: Insufficient documentation

## 2019-08-07 DIAGNOSIS — Z7984 Long term (current) use of oral hypoglycemic drugs: Secondary | ICD-10-CM | POA: Diagnosis not present

## 2019-08-07 DIAGNOSIS — E119 Type 2 diabetes mellitus without complications: Secondary | ICD-10-CM | POA: Diagnosis not present

## 2019-08-07 DIAGNOSIS — C220 Liver cell carcinoma: Secondary | ICD-10-CM | POA: Diagnosis not present

## 2019-08-07 DIAGNOSIS — Z7901 Long term (current) use of anticoagulants: Secondary | ICD-10-CM | POA: Diagnosis not present

## 2019-08-07 DIAGNOSIS — Z8249 Family history of ischemic heart disease and other diseases of the circulatory system: Secondary | ICD-10-CM | POA: Insufficient documentation

## 2019-08-07 DIAGNOSIS — Z79899 Other long term (current) drug therapy: Secondary | ICD-10-CM | POA: Diagnosis not present

## 2019-08-07 DIAGNOSIS — E785 Hyperlipidemia, unspecified: Secondary | ICD-10-CM | POA: Insufficient documentation

## 2019-08-07 DIAGNOSIS — K7460 Unspecified cirrhosis of liver: Secondary | ICD-10-CM | POA: Diagnosis not present

## 2019-08-07 NOTE — Progress Notes (Signed)
Patient does not offer any problems today.  

## 2019-08-07 NOTE — Progress Notes (Signed)
Hematology/Oncology Consult note [facility] [phone] [phone]   Patient Care Team: Ronnell Freshwater, NP as PCP - General (Family Medicine) End, Harrell Gave, MD as PCP - Cardiology (Cardiology)  REFERRING PROVIDER: Ronnell Freshwater, NP  CHIEF COMPLAINTS/REASON FOR VISIT:  Evaluation of elevated protein  HISTORY OF PRESENTING ILLNESS:   Adriana Miller is a  64 y.o.  female with PMH listed below was seen in consultation at the request of  Ronnell Freshwater, NP  for evaluation of elevated protein Patient has a history of alcoholic cirrhosis.  Recent admission due to bloody diarrhea and anemia due to acute blood loss.  She underwent EGD and colonoscopy.  EGD showed gastric bleeding erosion which was cauterized.  Evidence of portal hypertensive gastropathy.  Colonoscopy showed 2 small polyps that were removed. She has stopped drinking alcohol since discharge. 05/09/2019, cirrhotic liver morphology without focal mass lesion evident.  Trace ascites.  Cholelithiasis.  Tiny hypodensities in the pancreatic tail too small to characterize and potentially cystic.  Follow-up CT 3 to 6 months to ensure stability.  Abdominal MRI could be performed if patient is able to hold breath. Her blood work on 03/28/2019 showed protein level of 9.3, total bilirubin 2.4, Patient was referred to me for evaluation of elevated protein level. Patient reports feeling fatigued and weak.  Appetite is poor. She has multiple joint pain.  She reports swelling of right index finger.   INTERVAL HISTORY Adriana Miller is a 64 y.o. female who has above history reviewed by me today presents for follow up visit for management of newly diagnosed Derma.  During the interval, patient has MRI liver done which showed 2 liver masses both with avid hyper enhancement, both considered LR-5 (definitely [place]).  Patient denies any pain today.   Review of Systems  Constitutional: Positive for  appetite change, fatigue and unexpected weight change. Negative for chills and fever.  HENT:   Negative for hearing loss and voice change.   Eyes: Negative for eye problems.  Respiratory: Negative for chest tightness and cough.   Cardiovascular: Negative for chest pain.  Gastrointestinal: Negative for abdominal distention, abdominal pain and blood in stool.  Endocrine: Negative for hot flashes.  Genitourinary: Negative for difficulty urinating and frequency.   Musculoskeletal: Positive for arthralgias.  Skin: Negative for itching and rash.  Neurological: Negative for extremity weakness.  Hematological: Negative for adenopathy. Bruises/bleeds easily.  Psychiatric/Behavioral: Negative for confusion.    MEDICAL HISTORY:  Past Medical History:  Diagnosis Date  . B12 deficiency 04/05/2017  . Blood in stool   . Cerebral aneurysm   . Chronic kidney disease   . Diabetes mellitus without complication ([place])   . Gastric erosion with bleeding   . Hyperlipidemia   . Hypertension   . Lower extremity cellulitis 02/08/2019  . Seizure disorder [facility])     SURGICAL HISTORY: Past Surgical History:  Procedure Laterality Date  . CEREBRAL ANEURYSM REPAIR  1991  . COLONOSCOPY WITH PROPOFOL N/A 02/12/2019   Procedure: COLONOSCOPY WITH PROPOFOL;  Surgeon: Lin Landsman, MD;  Location[facility] ENDOSCOPY;  Service: Gastroenterology;  Laterality: N/A;  . ESOPHAGOGASTRODUODENOSCOPY (EGD) WITH PROPOFOL N/A 02/12/2019   Procedure: ESOPHAGOGASTRODUODENOSCOPY (EGD) WITH PROPOFOL;  Surgeon: Lin Landsman, MD;  Location: [facility] ENDOSCOPY;  Service: Gastroenterology;  Laterality: N/A;    SOCIAL HISTORY: Social History   Socioeconomic History  . Marital status: Divorced    Spouse name: Not on file  . Number of children: Not on file  .  Years of education: Not on file  . Highest education level: Not on file  Occupational History  . Not on file  Tobacco Use  . Smoking status: Never Smoker  .  Smokeless tobacco: Current User    Types: Snuff  Substance and Sexual Activity  . Alcohol use: Not Currently    Alcohol/week: 14.0 standard drinks    Types: 14 Cans of beer per week    Comment: patient quit in october 2020  . Drug use: No  . Sexual activity: Not Currently  Other Topics Concern  . Not on file  Social History Narrative  . Not on file   Social Determinants of Health   Financial Resource Strain:   . Difficulty of Paying Living Expenses:   Food Insecurity:   . Worried About Charity fundraiser in the Last Year:   . Arboriculturist in the Last Year:   Transportation Needs:   . Film/video editor (Medical):   Marland Kitchen Lack of Transportation (Non-Medical):   Physical Activity:   . Days of Exercise per Week:   . Minutes of Exercise per Session:   Stress:   . Feeling of Stress :   Social Connections:   . Frequency of Communication with Friends and Family:   . Frequency of Social Gatherings with Friends and Family:   . Attends Religious Services:   . Active Member of Clubs or Organizations:   . Attends Archivist Meetings:   Marland Kitchen Marital Status:   Intimate Partner Violence:   . Fear of Current or Ex-Partner:   . Emotionally Abused:   Marland Kitchen Physically Abused:   . Sexually Abused:     FAMILY HISTORY: Family History  Problem Relation Age of Onset  . Heart attack Brother 40    ALLERGIES:  is allergic to aspirin.  MEDICATIONS:  Current Outpatient Medications  Medication Sig Dispense Refill  . allopurinol (ZYLOPRIM) 100 MG tablet Take 1 tablet (100 mg total) by mouth daily. 90 tablet 1  . apixaban (ELIQUIS) 5 MG TABS tablet Take 1 tablet (5 mg total) by mouth 2 (two) times daily. 180 tablet 2  . diltiazem (CARTIA XT) 240 MG 24 hr capsule Take 1 capsule (240 mg total) by mouth daily. 30 capsule 11  . escitalopram (LEXAPRO) 10 MG tablet Take 1 tablet (10 mg total) by mouth daily. For anxiety/sleep 90 tablet 2  . furosemide (LASIX) 20 MG tablet Take 20 mg by  mouth daily.    Marland Kitchen glimepiride (AMARYL) 1 MG tablet Take 1 tablet (1 mg total) by mouth every morning. 90 tablet 0  . metFORMIN (GLUCOPHAGE) 500 MG tablet Take 1 tablet (500 mg total) by mouth daily with breakfast. 90 tablet 0  . metoprolol tartrate (LOPRESSOR) 25 MG tablet Take 25 mg by mouth daily.    . mirtazapine (REMERON) 7.5 MG tablet Take 1 tablet (7.5 mg total) by mouth at bedtime. 90 tablet 1  . spironolactone (ALDACTONE) 25 MG tablet Take 1 tablet (25 mg total) by mouth daily. For cirrhosis of liver 90 tablet 2   No current facility-administered medications for this visit.     PHYSICAL EXAMINATION: ECOG PERFORMANCE STATUS: 1 - Symptomatic but completely ambulatory Vitals:   08/07/19 1027  BP: 128/65  Pulse: 65  Resp: 16  Temp: (!) 97 F (36.1 C)   There were no vitals filed for this visit.  Physical Exam Constitutional:      General: She is not in acute distress.  Appearance: She is ill-appearing.  HENT:     Head: Normocephalic and atraumatic.  Eyes:     General: No scleral icterus. Cardiovascular:     Rate and Rhythm: Normal rate and regular rhythm.     Heart sounds: Normal heart sounds.  Pulmonary:     Effort: Pulmonary effort is normal. No respiratory distress.     Breath sounds: No wheezing.  Abdominal:     General: Bowel sounds are normal. There is no distension.     Palpations: Abdomen is soft.  Musculoskeletal:        General: No tenderness or deformity. Normal range of motion.     Cervical back: Normal range of motion and neck supple.  Skin:    General: Skin is warm and dry.     Findings: No erythema or rash.  Neurological:     Mental Status: She is alert and oriented to person, place, and time. Mental status is at baseline.     Cranial Nerves: No cranial nerve deficit.     Coordination: Coordination normal.  Psychiatric:        Mood and Affect: Mood normal.     LABORATORY DATA:  I have reviewed the data as listed Lab Results  Component  Value Date   WBC 6.3 07/26/2019   HGB 11.2 (L) 07/26/2019   HCT 32.7 (L) 07/26/2019   MCV 90.1 07/26/2019   PLT 141 (L) 07/26/2019   Recent Labs    03/28/19 1700 04/16/19 1337 06/27/19 1024 07/25/19 1436 07/26/19 0807  NA 138   < > 134* 126* 132*  K 3.1*   < > 4.3 4.2 3.8  CL 93*   < > 100 87* 102  CO2 32   < > '24 25 22  '$ GLUCOSE 120*   < > 318* 621* 312*  BUN 9   < > 24* 27* 20  CREATININE 1.31*   < > 1.12* 1.73* 1.20*  CALCIUM 8.8*   < > 9.2 9.8 8.7*  GFRNONAA 43*   < > 52* 30* 47*  GFRAA 49*   < > 60* 35* 55*  PROT 9.3*  --  8.7*  --  7.6  ALBUMIN 2.8*  --  3.1*  --  2.8*  AST 42*  --  24  --  21  ALT 16  --  16  --  14  ALKPHOS 56  --  85  --  75  BILITOT 2.4*  --  0.8  --  1.3*   < > = values in this interval not displayed.   Iron/TIBC/Ferritin/ %Sat    Component Value Date/Time   IRON 76 02/11/2019 0616   TIBC 122 (L) 02/11/2019 0616   FERRITIN 427 (H) 02/11/2019 0616   IRONPCTSAT 62 (H) 02/11/2019 9485      RADIOGRAPHIC STUDIES: I have personally reviewed the radiological images as listed and agreed with the findings in the report. DG Chest 2 View  Result Date: 07/25/2019 CLINICAL DATA:  Shortness of breath EXAM: CHEST - 2 VIEW COMPARISON:  02/08/2019 FINDINGS: Heart is normal size. Mitral valve annular calcifications. Lungs clear. No effusions or acute bony abnormality. IMPRESSION: No active cardiopulmonary disease. Electronically Signed   By: Rolm Baptise M.D.   On: 07/25/2019 15:12   CT Head Wo Contrast  Result Date: 07/25/2019 CLINICAL DATA:  TAA, aneurysm repair 1991 EXAM: CT HEAD WITHOUT CONTRAST TECHNIQUE: Contiguous axial images were obtained from the base of the skull through the vertex without intravenous contrast. COMPARISON:  None. FINDINGS: Brain: No evidence of acute infarction, hemorrhage, hydrocephalus, extra-axial collection or mass lesion/mass effect. Mild periventricular white matter hypodensity. Focal hypodensities of the anterior limb of the  right internal capsule and lentiform nuclei of the left basal ganglia. Vascular: No hyperdense vessel or unexpected calcification. Skull: Status post right frontal craniotomy. Negative for fracture or focal lesion. Sinuses/Orbits: No acute finding. Other: None. IMPRESSION: 1.  No acute intracranial pathology. 2. Small-vessel white matter disease and small focal hypodensities of the bilateral basal ganglia, consistent with small lacunar infarctions, age indeterminate. 3.  Status post right frontal craniotomy. Electronically Signed   By: Eddie Candle M.D.   On: 07/25/2019 16:44   MR LIVER W WO CONTRAST  Result Date: 07/23/2019 CLINICAL DATA:  Elevated AFP. Cirrhosis. Abdominal pain. EXAM: MRI ABDOMEN WITHOUT AND WITH CONTRAST TECHNIQUE: Multiplanar multisequence MR imaging of the abdomen was performed both before and after the administration of intravenous contrast. CONTRAST:  69m GADAVIST GADOBUTROL 1 MMOL/ML IV SOLN COMPARISON:  05/17/2019 CT abdomen. FINDINGS: Lower chest: No acute abnormality at the lung bases. Hepatobiliary: Diffusely irregular liver surface with slight hypertrophy of the lateral segment left liver lobe. Patchy lace-like T2 hyperintensity throughout the liver compatible with confluent hepatic fibrosis. These findings are all compatible with cirrhosis. No hepatic steatosis. Posterior segment 2 left liver lobe 2.0 x 1.6 cm mass (series 12/image 36) with arterial phase hyperenhancement, subtle washout and subtle capsule formation, unchanged since 05/09/2019 CT abdomen study, considered LR-5. Far inferior segment 3 left liver lobe 1.7 x 1.3 cm mass (series 12/image 62) with prominent arterial phase hyperenhancement with suggestion of subtle washout and capsule formation, stable since 05/09/2019 CT, considered LR-5. Minimally complex 0.8 cm segment 6 right liver cyst (series 8/image 15) with thin internal septation. Lobulated cystic 2.1 x 2.1 cm focus along the inferior right liver capsule (series  8/image 22). Numerous layering subcentimeter gallstones. No gallbladder wall thickening. No pericholecystic fluid. No biliary ductal dilatation. Common bile duct diameter 2 mm. No choledocholithiasis. No biliary strictures, masses or beading. Pancreas: No pancreatic mass or duct dilation.  No pancreas divisum. Spleen: Normal size spleen. Tiny 0.9 cm anterior splenic cystic lesion with suggestion of thin internal septations, favoring a benign lymphangioma. No additional splenic lesions. Adrenals/Urinary Tract: Normal adrenals. No hydronephrosis. Subcentimeter T1 hyperintense renal cortical lesions in the posterior upper left kidney and posterior lower right kidney demonstrate no convincing enhancement, compatible with Bosniak category 2 hemorrhagic/proteinaceous renal cysts. Additional subcentimeter simple renal cysts scattered in the left kidney. No suspicious renal masses. Stomach/Bowel: Small hiatal hernia. Otherwise normal nondistended stomach. Visualized small and large bowel is normal caliber, with no bowel wall thickening. Mild left colonic diverticulosis. Vascular/Lymphatic: Normal caliber abdominal aorta. Patent portal, splenic, hepatic and renal veins. No pathologically enlarged lymph nodes in the abdomen. Other: No abdominal ascites or focal fluid collection. Musculoskeletal: No aggressive appearing focal osseous lesions. IMPRESSION: 1. Cirrhosis. Posterior segment 2 left liver lobe 2.0 cm mass and inferior segment 3 left liver lobe 1.7 cm mass, both with avid arterial hyperenhancement, both considered LR-5 ([place]. 2. No ascites. 3. Cholelithiasis. No biliary ductal dilatation. No choledocholithiasis. 4. Small hiatal hernia. 5. Mild left colonic diverticulosis. Electronically Signed   By: JIlona SorrelM.D.   On: 07/23/2019 14:07      ASSESSMENT & PLAN:  1. [place] ([place]   2. Hepatic cirrhosis, unspecified hepatic cirrhosis type, unspecified whether ascites present ([facility]      # Hepatocellular carcinoma.  MRI image was independently  reviewed and discussed with patient. MRI findings and elevated AFP are diagnostic for HCC.  Recommend patient to establish care with liver surgeon at tertiary center for resection/ transplant.  Obtain CT of chest for staging.  If she is not surgical candidate, will arrange patient to have local regional therapy/systemic therapy   #Elevated protein- Multiple myeloma work up in negative.  Elevated free light chain, with normal ratio, likely secondary to chronic kidney disease.  Follow up: to be determined.  All questions were answered. The patient knows to call the clinic with any problems questions or concerns. Earlie Server, MD, PhD Hematology Oncology [facility] Pager- [phone] 08/07/2019

## 2019-08-07 NOTE — Progress Notes (Signed)
Introduced self to Ms Adriana Miller. Referral entered for Dr. Maudie Mercury, Surgical Oncology, at [facility]. Encouraged [name] to keep her phone close by as she can be difficult to contact. Will follow up to ensure she has been scheduled.

## 2019-08-08 ENCOUNTER — Telehealth: Payer: Self-pay

## 2019-08-08 NOTE — Telephone Encounter (Signed)
Called and notified Adriana Miller that she will need a chest CT to complete her staging. She utilizes [employer] transportation and will need 3-4 days notice to arrange. CT scheduled for 4/30 at the [facility]. She will arrive at 1515 for a 1530 appointment. She wrote these instructions down and read back performed.

## 2019-08-10 ENCOUNTER — Telehealth: Payer: Self-pay

## 2019-08-10 NOTE — Telephone Encounter (Signed)
error 

## 2019-08-14 ENCOUNTER — Ambulatory Visit: Payer: Medicare Other | Admitting: Adult Health

## 2019-08-17 ENCOUNTER — Other Ambulatory Visit: Payer: Self-pay

## 2019-08-17 ENCOUNTER — Ambulatory Visit
Admission: RE | Admit: 2019-08-17 | Discharge: 2019-08-17 | Disposition: A | Discharge status: Home / Self Care | Payer: Medicare Other | Source: Ambulatory Visit | Attending: Oncology | Admitting: Oncology

## 2019-08-17 DIAGNOSIS — K7460 Unspecified cirrhosis of liver: Secondary | ICD-10-CM | POA: Diagnosis not present

## 2019-08-17 DIAGNOSIS — R918 Other nonspecific abnormal finding of lung field: Secondary | ICD-10-CM | POA: Diagnosis not present

## 2019-08-17 DIAGNOSIS — C220 Liver cell carcinoma: Secondary | ICD-10-CM | POA: Diagnosis not present

## 2019-08-17 IMAGING — CT CT CHEST W/O CM
2 of 4 series · 15 of 36 positions shown, 18 images · non-contrast
Comparison: Prior abdominal imaging.

CLINICAL DATA: History of hepatocellular carcinoma by imaging.

EXAM:
CT CHEST WITHOUT CONTRAST
TECHNIQUE: Multidetector CT imaging of the chest was performed following the
standard protocol without IV contrast.

[Series 2: chest 2.00 · axial · 0.67mm/px · z∈[-1119,-883]mm · 12 of 140 slices shown, 15 images]
[im 11/140  mediastinal]
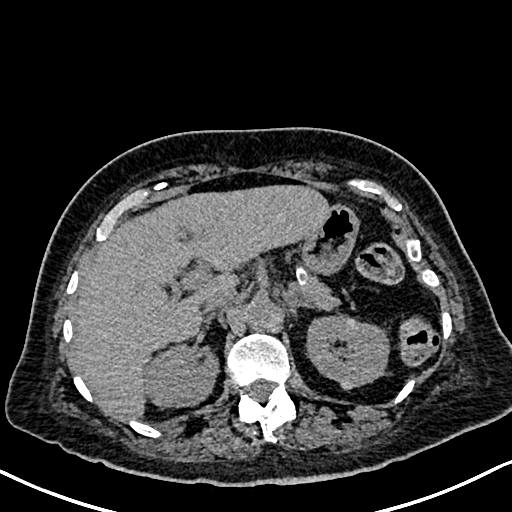
[im 11/140  lung]
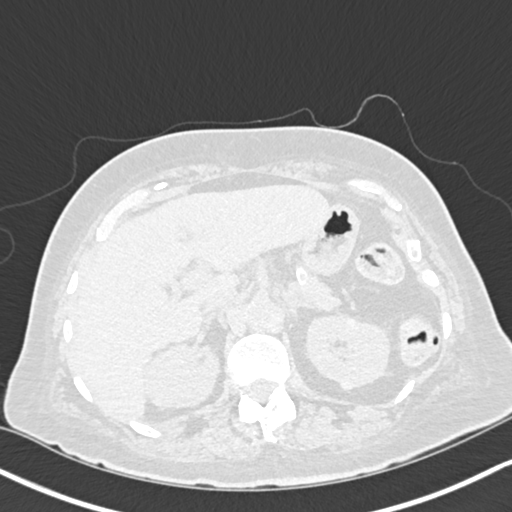
[im 22/140  lung]
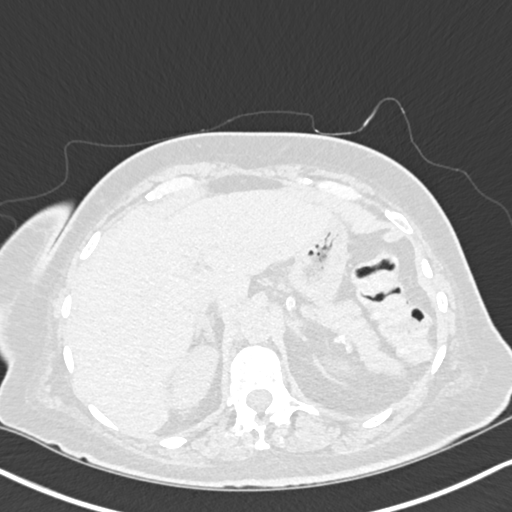
[im 33/140  lung]
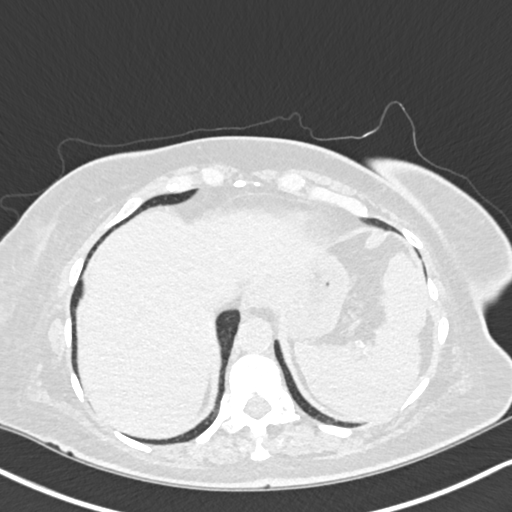
[im 43/140  lung]
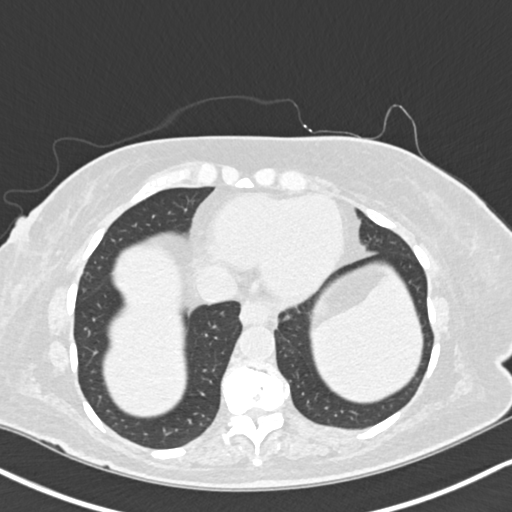
[im 54/140  mediastinal]
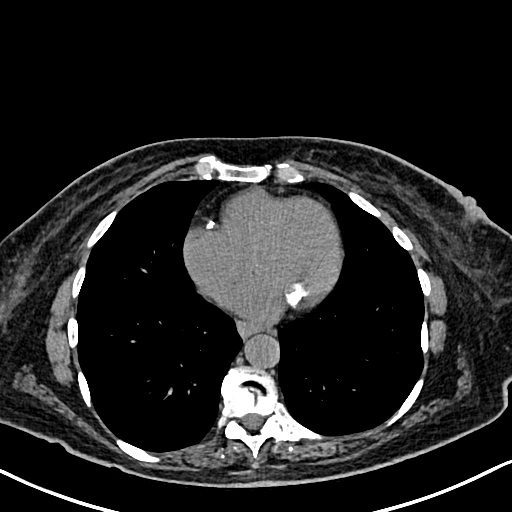
[im 54/140  lung]
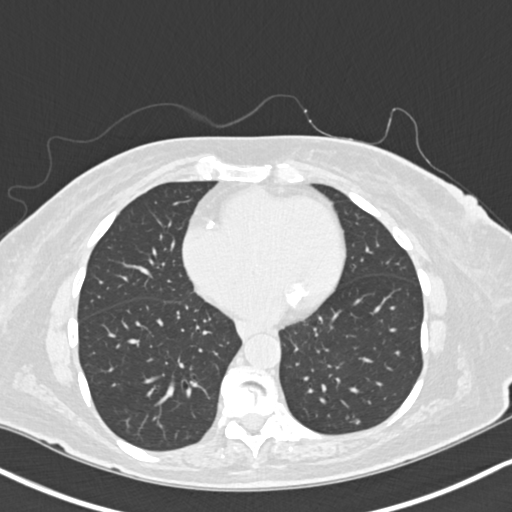
[im 65/140  lung]
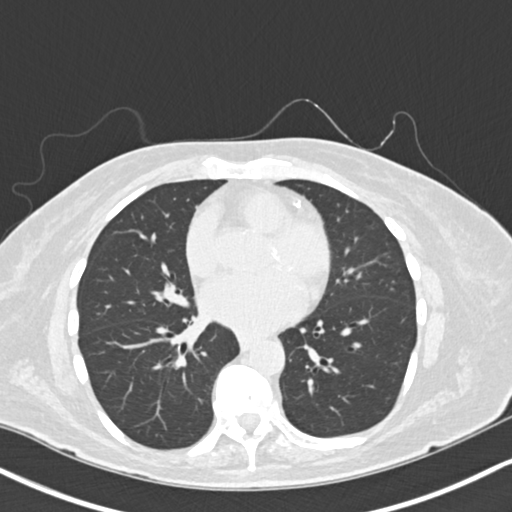
[im 75/140  lung]
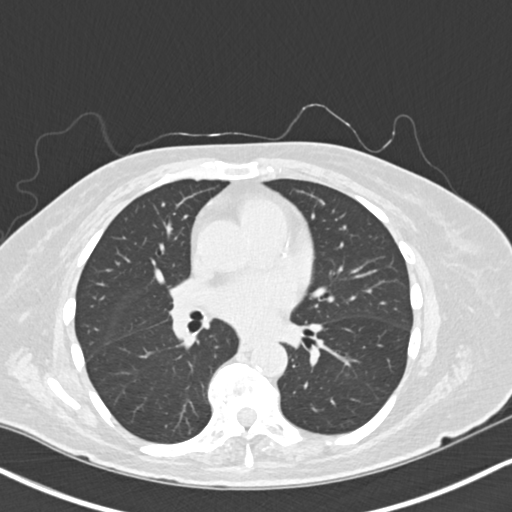
[im 86/140  lung]
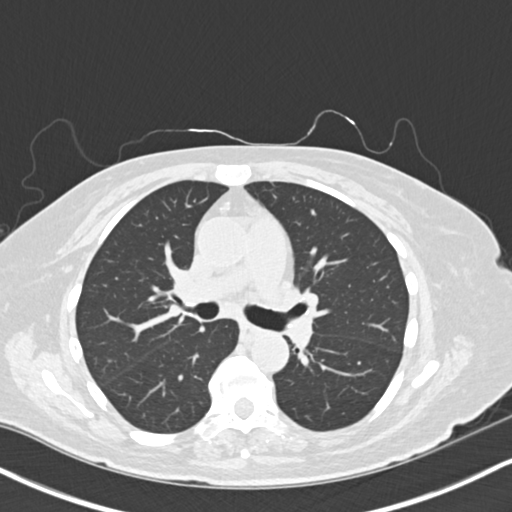
[im 97/140  mediastinal]
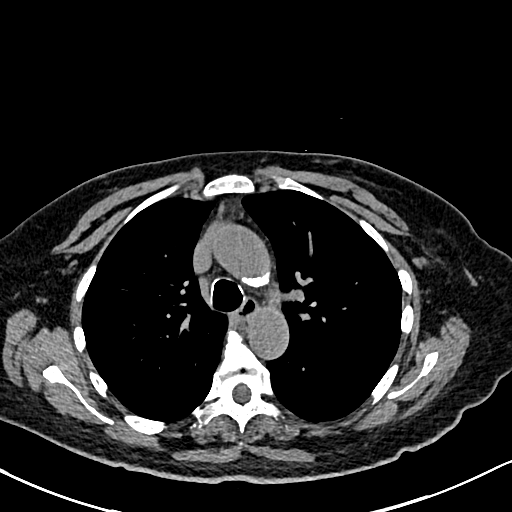
[im 97/140  lung]
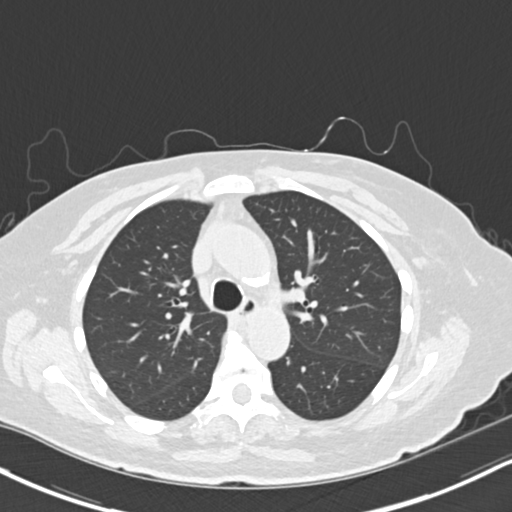
[im 107/140  lung]
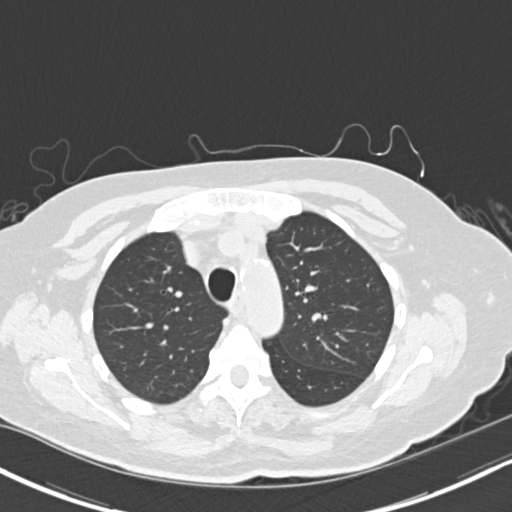
[im 118/140  lung]
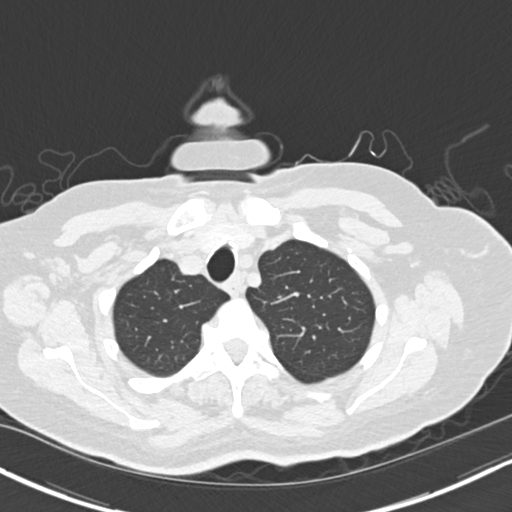
[im 129/140  lung]
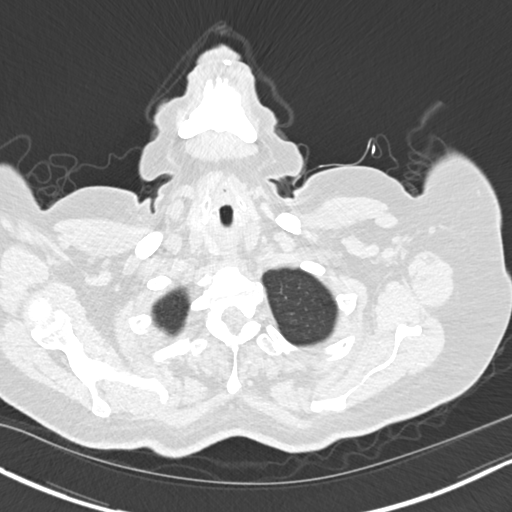

[Series 5: coronals chest 2.00 cor · coronal · 0.55mm/px · 3 of 153 slices shown]
[im 31/153  lung]
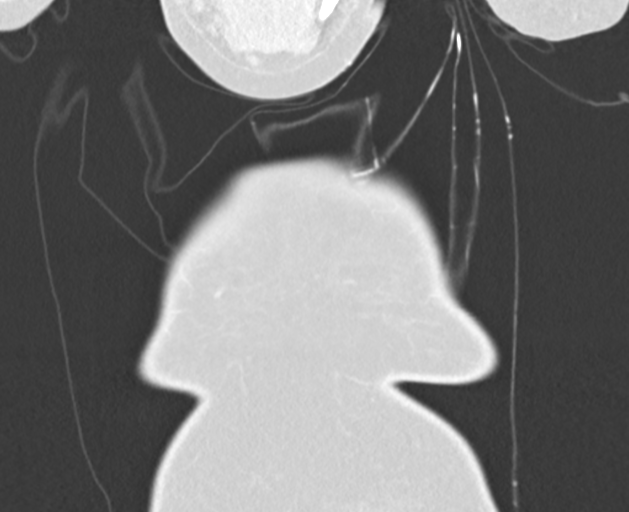
[im 61/153  lung]
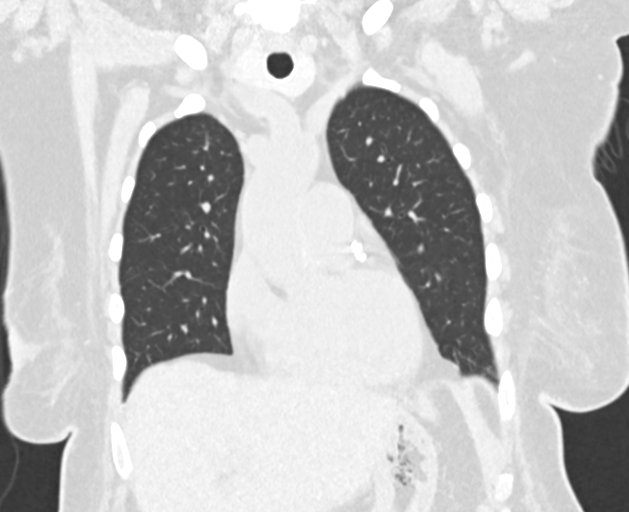
[im 92/153  lung]
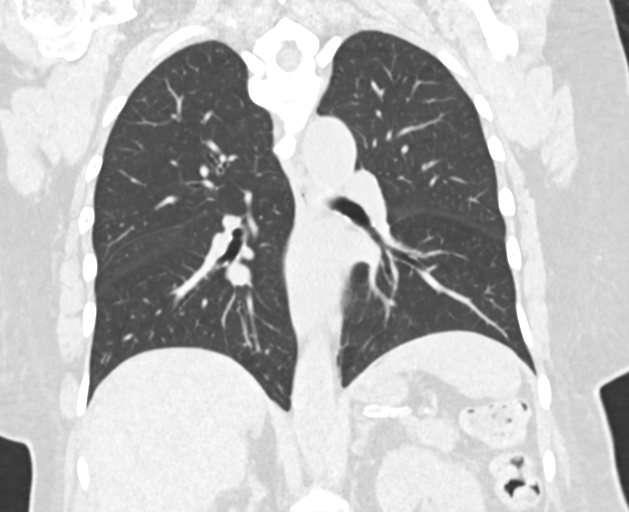

[15 of 36 positions shown; findings below may reference images not displayed]

FINDINGS: Cardiovascular: Calcified atheromatous plaque in the thoracic aorta.
Limited assessment of vascular structures due to lack of intravenous
contrast. Mitral annular calcification. The coronary artery
calcification, three-vessel coronary artery disease. No pericardial
effusion.

Mediastinum/Nodes: Thoracic inlet structures are normal. No axillary
lymphadenopathy. No mediastinal lymphadenopathy. Mild enlargement of
subcarinal nodal tissue less than a cm, 9 mm. No gross hilar
adenopathy limited assessment of hilar structures due to lack of
intravenous contrast.

Lungs/Pleura: Branching parenchymal opacity at the LEFT lung base
appears to follow bronchovascular structures and may be associated
with subtle calcification in addition to branching morphology.

Granuloma in the LEFT upper lobe. Airways are patent.

Upper Abdomen: Lobular hepatic contours. Compatible with known
cirrhosis. Mass lesions in this patient are not well visualized on
noncontrast imaging. Spleen is enlarged. Nephrolithiasis on the
RIGHT 4 mm calculus in the upper pole. Small renal cysts partially
visualized.

Musculoskeletal: No acute bone finding. No destructive bone process.
IMPRESSION: 1. LEFT lower lobe opacity with branching characteristics likely
sequela of prior infection or inflammation. Attention on follow-up.
This shows a geographic rather than focal pattern. Consider
follow-up in 3-6 months or comparison with prior imaging to confirm
and or establish
2. Lobular hepatic contours, consistent with known cirrhosis. Mass
lesions in this patient are not well visualized on noncontrast
imaging. No signs of metastatic disease to the chest.
3. Splenomegaly.
4. Right nephrolithiasis.
5. Aortic atherosclerosis.

Aortic Atherosclerosis ([9E]-[9E]).

## 2019-08-20 DIAGNOSIS — K7460 Unspecified cirrhosis of liver: Secondary | ICD-10-CM | POA: Diagnosis not present

## 2019-08-20 DIAGNOSIS — K7689 Other specified diseases of liver: Secondary | ICD-10-CM | POA: Diagnosis not present

## 2019-08-20 DIAGNOSIS — C220 Liver cell carcinoma: Secondary | ICD-10-CM | POA: Diagnosis not present

## 2019-08-20 DIAGNOSIS — R160 Hepatomegaly, not elsewhere classified: Secondary | ICD-10-CM | POA: Diagnosis not present

## 2019-08-20 DIAGNOSIS — R772 Abnormality of alphafetoprotein: Secondary | ICD-10-CM | POA: Diagnosis not present

## 2019-08-20 DIAGNOSIS — R296 Repeated falls: Secondary | ICD-10-CM | POA: Diagnosis not present

## 2019-08-21 ENCOUNTER — Telehealth: Payer: Self-pay

## 2019-08-21 NOTE — Telephone Encounter (Signed)
Done..  Pt has been scheduled as requested. Pt is aware of her sched 08/28/19 MD Only appt.

## 2019-08-21 NOTE — Telephone Encounter (Signed)
-----   Message from Earlie Server, MD sent at 08/20/2019 11:00 PM EDT ----- Please schedule her for a follow up visit. Thank you.  MD only. Thank you

## 2019-08-23 ENCOUNTER — Telehealth: Payer: Self-pay

## 2019-08-23 ENCOUNTER — Other Ambulatory Visit: Payer: Self-pay | Admitting: Oncology

## 2019-08-23 DIAGNOSIS — C220 Liver cell carcinoma: Secondary | ICD-10-CM

## 2019-08-23 NOTE — Telephone Encounter (Signed)
Per Dr. Tasia Catchings, referral placed to IR to evaluate for treatments option for [facility]. She has been seen by surgery and was not a surgical candidate. Spoke with Jocelyn Lamer at [facility] radiology and she will reach out to Ms. Stage for scheduling.

## 2019-08-28 ENCOUNTER — Ambulatory Visit
Admission: RE | Admit: 2019-08-28 | Discharge: 2019-08-28 | Disposition: A | Discharge status: Home / Self Care | Payer: Medicare Other | Source: Ambulatory Visit | Attending: Oncology | Admitting: Oncology

## 2019-08-28 ENCOUNTER — Inpatient Hospital Stay: Payer: Medicare Other | Attending: Oncology | Admitting: Oncology

## 2019-08-28 ENCOUNTER — Encounter: Payer: Self-pay | Admitting: Oncology

## 2019-08-28 ENCOUNTER — Other Ambulatory Visit: Payer: Self-pay

## 2019-08-28 VITALS — BP 133/80 | HR 76 | Temp 96.3°F | Resp 18 | Wt 166.3 lb

## 2019-08-28 DIAGNOSIS — K7460 Unspecified cirrhosis of liver: Secondary | ICD-10-CM | POA: Diagnosis not present

## 2019-08-28 DIAGNOSIS — Z7984 Long term (current) use of oral hypoglycemic drugs: Secondary | ICD-10-CM | POA: Diagnosis not present

## 2019-08-28 DIAGNOSIS — N189 Chronic kidney disease, unspecified: Secondary | ICD-10-CM | POA: Insufficient documentation

## 2019-08-28 DIAGNOSIS — G40909 Epilepsy, unspecified, not intractable, without status epilepticus: Secondary | ICD-10-CM | POA: Diagnosis not present

## 2019-08-28 DIAGNOSIS — E538 Deficiency of other specified B group vitamins: Secondary | ICD-10-CM | POA: Diagnosis not present

## 2019-08-28 DIAGNOSIS — Z8249 Family history of ischemic heart disease and other diseases of the circulatory system: Secondary | ICD-10-CM | POA: Insufficient documentation

## 2019-08-28 DIAGNOSIS — Z79899 Other long term (current) drug therapy: Secondary | ICD-10-CM | POA: Insufficient documentation

## 2019-08-28 DIAGNOSIS — I10 Essential (primary) hypertension: Secondary | ICD-10-CM | POA: Insufficient documentation

## 2019-08-28 DIAGNOSIS — E785 Hyperlipidemia, unspecified: Secondary | ICD-10-CM | POA: Insufficient documentation

## 2019-08-28 DIAGNOSIS — Z7189 Other specified counseling: Secondary | ICD-10-CM

## 2019-08-28 DIAGNOSIS — C220 Liver cell carcinoma: Secondary | ICD-10-CM

## 2019-08-28 DIAGNOSIS — Z515 Encounter for palliative care: Secondary | ICD-10-CM | POA: Insufficient documentation

## 2019-08-28 DIAGNOSIS — Z7901 Long term (current) use of anticoagulants: Secondary | ICD-10-CM | POA: Diagnosis not present

## 2019-08-28 DIAGNOSIS — Z8739 Personal history of other diseases of the musculoskeletal system and connective tissue: Secondary | ICD-10-CM

## 2019-08-28 DIAGNOSIS — E1165 Type 2 diabetes mellitus with hyperglycemia: Secondary | ICD-10-CM | POA: Diagnosis not present

## 2019-08-28 HISTORY — PX: IR RADIOLOGIST EVAL & MGMT: IMG5224

## 2019-08-28 NOTE — Progress Notes (Signed)
Hematology/Oncology follow up  [facility] [phone] [phone]   Patient Care Team: Ronnell Freshwater, NP as PCP - General (Family Medicine) End, Harrell Gave, MD as PCP - Cardiology (Cardiology) Clent Jacks, RN as Oncology Nurse Navigator  REFERRING PROVIDER: Ronnell Freshwater, NP  CHIEF COMPLAINTS/REASON FOR VISIT:  Follow-up for [facility] HISTORY OF PRESENTING ILLNESS:   Adriana Miller is a  64 y.o.  female with PMH listed below was seen in consultation at the request of  Ronnell Freshwater, NP  for evaluation of elevated protein Patient has a history of alcoholic cirrhosis.  Recent admission due to bloody diarrhea and anemia due to acute blood loss.  She underwent EGD and colonoscopy.  EGD showed gastric bleeding erosion which was cauterized.  Evidence of portal hypertensive gastropathy.  Colonoscopy showed 2 small polyps that were removed. She has stopped drinking alcohol since discharge. 05/09/2019, cirrhotic liver morphology without focal mass lesion evident.  Trace ascites.  Cholelithiasis.  Tiny hypodensities in the pancreatic tail too small to characterize and potentially cystic.  Follow-up CT 3 to 6 months to ensure stability.  Abdominal MRI could be performed if patient is able to hold breath. Her blood work on 03/28/2019 showed protein level of 9.3, total bilirubin 2.4, Patient was referred to me for evaluation of elevated protein level. Patient reports feeling fatigued and weak.  Appetite is poor. She has multiple joint pain.  She reports swelling of right index finger.  INTERVAL HISTORY Adriana Miller is a 63 y.o. female who has above history reviewed by me today presents for follow up visit for management of [place].  07/23/2019 MRI liver done which showed 2 liver masses both with avid hyper enhancement, both considered LR-5 (definitely [place]).  I referred patient to have been in consultation with Dr. Sheilah Pigeon surgical oncology at [facility]  for resectability. Patient was seen by Dr. Maudie Mercury on 08/20/2019.  Patient was deemed not a surgical candidate due to her multiple comorbidities, functional status.  She is not a transplant candidate due to her comorbidities and functional status.  Additionally it appears that patient does not want any major abdominal operations.  Patient was recommended to have local regional therapy.  Patient prefers to stay local to proceed with her therapy. This morning she has a consultation with interventional radiologist regarding options of local regional therapy. She has no new complaints. She has a brother who lives close by and she wants to appoint her brother to be main contact of her family members.  She has a son who lives in [place]. She is ambulatory with a cane.  Lives by herself.  She is able to dress herself..  She has aide who helps her with activities of daily living including cooking and cleaning.   Review of Systems  Constitutional: Positive for appetite change, fatigue and unexpected weight change. Negative for chills and fever.  HENT:   Negative for hearing loss and voice change.   Eyes: Negative for eye problems.  Respiratory: Negative for chest tightness and cough.   Cardiovascular: Negative for chest pain.  Gastrointestinal: Negative for abdominal distention, abdominal pain and blood in stool.  Endocrine: Negative for hot flashes.  Genitourinary: Negative for difficulty urinating and frequency.   Musculoskeletal: Positive for arthralgias.  Skin: Negative for itching and rash.  Neurological: Negative for extremity weakness.  Hematological: Negative for adenopathy. Bruises/bleeds easily.  Psychiatric/Behavioral: Negative for confusion.    MEDICAL HISTORY:  Past Medical History:  Diagnosis Date  .  B12 deficiency 04/05/2017  . Blood in stool   . Cerebral aneurysm   . Chronic kidney disease   . Diabetes mellitus without complication ([place])   . Gastric erosion with bleeding   .  Hyperlipidemia   . Hypertension   . Lower extremity cellulitis 02/08/2019  . Seizure disorder [facility])     SURGICAL HISTORY: Past Surgical History:  Procedure Laterality Date  . CEREBRAL ANEURYSM REPAIR  1991  . COLONOSCOPY WITH PROPOFOL N/A 02/12/2019   Procedure: COLONOSCOPY WITH PROPOFOL;  Surgeon: Lin Landsman, MD;  Location[facility] ENDOSCOPY;  Service: Gastroenterology;  Laterality: N/A;  . ESOPHAGOGASTRODUODENOSCOPY (EGD) WITH PROPOFOL N/A 02/12/2019   Procedure: ESOPHAGOGASTRODUODENOSCOPY (EGD) WITH PROPOFOL;  Surgeon: Lin Landsman, MD;  Location: [facility] ENDOSCOPY;  Service: Gastroenterology;  Laterality: N/A;  . IR RADIOLOGIST EVAL & MGMT  08/28/2019    SOCIAL HISTORY: Social History   Socioeconomic History  . Marital status: Divorced    Spouse name: Not on file  . Number of children: Not on file  . Years of education: Not on file  . Highest education level: Not on file  Occupational History  . Not on file  Tobacco Use  . Smoking status: Never Smoker  . Smokeless tobacco: Current User    Types: Snuff  Substance and Sexual Activity  . Alcohol use: Not Currently    Alcohol/week: 14.0 standard drinks    Types: 14 Cans of beer per week    Comment: patient quit in october 2020  . Drug use: No  . Sexual activity: Not Currently  Other Topics Concern  . Not on file  Social History Narrative  . Not on file   Social Determinants of Health   Financial Resource Strain:   . Difficulty of Paying Living Expenses:   Food Insecurity:   . Worried About Charity fundraiser in the Last Year:   . Arboriculturist in the Last Year:   Transportation Needs:   . Film/video editor (Medical):   Marland Kitchen Lack of Transportation (Non-Medical):   Physical Activity:   . Days of Exercise per Week:   . Minutes of Exercise per Session:   Stress:   . Feeling of Stress :   Social Connections:   . Frequency of Communication with Friends and Family:   . Frequency of Social Gatherings  with Friends and Family:   . Attends Religious Services:   . Active Member of Clubs or Organizations:   . Attends Archivist Meetings:   Marland Kitchen Marital Status:   Intimate Partner Violence:   . Fear of Current or Ex-Partner:   . Emotionally Abused:   Marland Kitchen Physically Abused:   . Sexually Abused:     FAMILY HISTORY: Family History  Problem Relation Age of Onset  . Heart attack Brother 35    ALLERGIES:  is allergic to aspirin.  MEDICATIONS:  Current Outpatient Medications  Medication Sig Dispense Refill  . allopurinol (ZYLOPRIM) 100 MG tablet Take 1 tablet (100 mg total) by mouth daily. 90 tablet 1  . apixaban (ELIQUIS) 5 MG TABS tablet Take 1 tablet (5 mg total) by mouth 2 (two) times daily. 180 tablet 2  . diltiazem (CARTIA XT) 240 MG 24 hr capsule Take 1 capsule (240 mg total) by mouth daily. 30 capsule 11  . escitalopram (LEXAPRO) 10 MG tablet Take 1 tablet (10 mg total) by mouth daily. For anxiety/sleep 90 tablet 2  . furosemide (LASIX) 20 MG tablet Take 20 mg by mouth  daily.    . glimepiride (AMARYL) 1 MG tablet Take 1 tablet (1 mg total) by mouth every morning. 90 tablet 0  . metFORMIN (GLUCOPHAGE) 500 MG tablet Take 1 tablet (500 mg total) by mouth daily with breakfast. 90 tablet 0  . metoprolol tartrate (LOPRESSOR) 25 MG tablet Take 25 mg by mouth daily.    . mirtazapine (REMERON) 7.5 MG tablet Take 1 tablet (7.5 mg total) by mouth at bedtime. 90 tablet 1  . spironolactone (ALDACTONE) 25 MG tablet Take 1 tablet (25 mg total) by mouth daily. For cirrhosis of liver 90 tablet 2   No current facility-administered medications for this visit.     PHYSICAL EXAMINATION: ECOG PERFORMANCE STATUS: 1 - Symptomatic but completely ambulatory Vitals:   08/28/19 1050  BP: 133/80  Pulse: 76  Resp: 18  Temp: (!) 96.3 F (35.7 C)   Filed Weights   08/28/19 1050  Weight: 166 lb 4.8 oz (75.4 kg)    Physical Exam Constitutional:      General: She is not in acute distress.     Comments: Patient states in the wheelchair.  HENT:     Head: Normocephalic and atraumatic.  Eyes:     General: No scleral icterus. Cardiovascular:     Rate and Rhythm: Normal rate and regular rhythm.     Heart sounds: Normal heart sounds.  Pulmonary:     Effort: Pulmonary effort is normal. No respiratory distress.     Breath sounds: No wheezing.  Abdominal:     General: Bowel sounds are normal. There is no distension.     Palpations: Abdomen is soft.  Musculoskeletal:        General: No tenderness or deformity. Normal range of motion.     Cervical back: Normal range of motion and neck supple.  Skin:    General: Skin is warm and dry.     Findings: No erythema or rash.  Neurological:     Mental Status: She is alert and oriented to person, place, and time. Mental status is at baseline.     Cranial Nerves: No cranial nerve deficit.     Coordination: Coordination normal.  Psychiatric:        Mood and Affect: Mood normal.     LABORATORY DATA:  I have reviewed the data as listed Lab Results  Component Value Date   WBC 6.3 07/26/2019   HGB 11.2 (L) 07/26/2019   HCT 32.7 (L) 07/26/2019   MCV 90.1 07/26/2019   PLT 141 (L) 07/26/2019   Recent Labs    03/28/19 1700 04/16/19 1337 06/27/19 1024 07/25/19 1436 07/26/19 0807  NA 138   < > 134* 126* 132*  K 3.1*   < > 4.3 4.2 3.8  CL 93*   < > 100 87* 102  CO2 32   < > '24 25 22  '$ GLUCOSE 120*   < > 318* 621* 312*  BUN 9   < > 24* 27* 20  CREATININE 1.31*   < > 1.12* 1.73* 1.20*  CALCIUM 8.8*   < > 9.2 9.8 8.7*  GFRNONAA 43*   < > 52* 30* 47*  GFRAA 49*   < > 60* 35* 55*  PROT 9.3*  --  8.7*  --  7.6  ALBUMIN 2.8*  --  3.1*  --  2.8*  AST 42*  --  24  --  21  ALT 16  --  16  --  14  ALKPHOS 56  --  85  --  75  BILITOT 2.4*  --  0.8  --  1.3*   < > = values in this interval not displayed.   Iron/TIBC/Ferritin/ %Sat    Component Value Date/Time   IRON 76 02/11/2019 0616   TIBC 122 (L) 02/11/2019 0616   FERRITIN 427 (H)  02/11/2019 0616   IRONPCTSAT 62 (H) 02/11/2019 6045      RADIOGRAPHIC STUDIES: I have personally reviewed the radiological images as listed and agreed with the findings in the report. DG Chest 2 View  Result Date: 07/25/2019 CLINICAL DATA:  Shortness of breath EXAM: CHEST - 2 VIEW COMPARISON:  02/08/2019 FINDINGS: Heart is normal size. Mitral valve annular calcifications. Lungs clear. No effusions or acute bony abnormality. IMPRESSION: No active cardiopulmonary disease. Electronically Signed   By: Rolm Baptise M.D.   On: 07/25/2019 15:12   CT Head Wo Contrast  Result Date: 07/25/2019 CLINICAL DATA:  TAA, aneurysm repair 1991 EXAM: CT HEAD WITHOUT CONTRAST TECHNIQUE: Contiguous axial images were obtained from the base of the skull through the vertex without intravenous contrast. COMPARISON:  None. FINDINGS: Brain: No evidence of acute infarction, hemorrhage, hydrocephalus, extra-axial collection or mass lesion/mass effect. Mild periventricular white matter hypodensity. Focal hypodensities of the anterior limb of the right internal capsule and lentiform nuclei of the left basal ganglia. Vascular: No hyperdense vessel or unexpected calcification. Skull: Status post right frontal craniotomy. Negative for fracture or focal lesion. Sinuses/Orbits: No acute finding. Other: None. IMPRESSION: 1.  No acute intracranial pathology. 2. Small-vessel white matter disease and small focal hypodensities of the bilateral basal ganglia, consistent with small lacunar infarctions, age indeterminate. 3.  Status post right frontal craniotomy. Electronically Signed   By: Eddie Candle M.D.   On: 07/25/2019 16:44   CT Chest Wo Contrast  Result Date: 08/17/2019 CLINICAL DATA:  History of hepatocellular carcinoma by imaging. EXAM: CT CHEST WITHOUT CONTRAST TECHNIQUE: Multidetector CT imaging of the chest was performed following the standard protocol without IV contrast. COMPARISON:  Prior abdominal imaging. FINDINGS:  Cardiovascular: Calcified atheromatous plaque in the thoracic aorta. Limited assessment of vascular structures due to lack of intravenous contrast. Mitral annular calcification. The coronary artery calcification, three-vessel coronary artery disease. No pericardial effusion. Mediastinum/Nodes: Thoracic inlet structures are normal. No axillary lymphadenopathy. No mediastinal lymphadenopathy. Mild enlargement of subcarinal nodal tissue less than a cm, 9 mm. No gross hilar adenopathy limited assessment of hilar structures due to lack of intravenous contrast. Lungs/Pleura: Branching parenchymal opacity at the LEFT lung base appears to follow bronchovascular structures and may be associated with subtle calcification in addition to branching morphology. 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 Airways are patent. Upper Abdomen: Lobular hepatic contours. Compatible with known cirrhosis. Mass lesions in this patient are not well visualized on noncontrast imaging. Spleen is enlarged. Nephrolithiasis on the RIGHT 4 mm calculus in the upper pole. Small renal cysts partially visualized. Musculoskeletal: No acute bone finding. No destructive bone process. IMPRESSION: 1. LEFT lower lobe opacity with branching characteristics likely sequela of prior infection or inflammation. Attention on follow-up. This shows a geographic rather than focal pattern. Consider follow-up in 3-6 months or comparison with prior imaging to confirm and or establish 2. Lobular hepatic contours, consistent with known cirrhosis. Mass lesions in this patient are not well visualized on noncontrast imaging. No signs of metastatic disease to the chest. 3. Splenomegaly. 4. Right nephrolithiasis. 5. Aortic atherosclerosis. Aortic Atherosclerosis (ICD10-I70.0). Electronically Signed   By: Zetta Bills M.D.   On: 08/17/2019 15:56   MR  LIVER W WO CONTRAST  Result Date: 07/23/2019 CLINICAL DATA:  Elevated AFP. Cirrhosis. Abdominal pain. EXAM: MRI ABDOMEN WITHOUT AND  WITH CONTRAST TECHNIQUE: Multiplanar multisequence MR imaging of the abdomen was performed both before and after the administration of intravenous contrast. CONTRAST:  56m GADAVIST GADOBUTROL 1 MMOL/ML IV SOLN COMPARISON:  05/17/2019 CT abdomen. FINDINGS: Lower chest: No acute abnormality at the lung bases. Hepatobiliary: Diffusely irregular liver surface with slight hypertrophy of the lateral segment left liver lobe. Patchy lace-like T2 hyperintensity throughout the liver compatible with confluent hepatic fibrosis. These findings are all compatible with cirrhosis. No hepatic steatosis. Posterior segment 2 left liver lobe 2.0 x 1.6 cm mass (series 12/image 36) with arterial phase hyperenhancement, subtle washout and subtle capsule formation, unchanged since 05/09/2019 CT abdomen study, considered LR-5. Far inferior segment 3 left liver lobe 1.7 x 1.3 cm mass (series 12/image 62) with prominent arterial phase hyperenhancement with suggestion of subtle washout and capsule formation, stable since 05/09/2019 CT, considered LR-5. Minimally complex 0.8 cm segment 6 right liver cyst (series 8/image 15) with thin internal septation. Lobulated cystic 2.1 x 2.1 cm focus along the inferior right liver capsule (series 8/image 22). Numerous layering subcentimeter gallstones. No gallbladder wall thickening. No pericholecystic fluid. No biliary ductal dilatation. Common bile duct diameter 2 mm. No choledocholithiasis. No biliary strictures, masses or beading. Pancreas: No pancreatic mass or duct dilation.  No pancreas divisum. Spleen: Normal size spleen. Tiny 0.9 cm anterior splenic cystic lesion with suggestion of thin internal septations, favoring a benign lymphangioma. No additional splenic lesions. Adrenals/Urinary Tract: Normal adrenals. No hydronephrosis. Subcentimeter T1 hyperintense renal cortical lesions in the posterior upper left kidney and posterior lower right kidney demonstrate no convincing enhancement, compatible  with Bosniak category 2 hemorrhagic/proteinaceous renal cysts. Additional subcentimeter simple renal cysts scattered in the left kidney. No suspicious renal masses. Stomach/Bowel: Small hiatal hernia. Otherwise normal nondistended stomach. Visualized small and large bowel is normal caliber, with no bowel wall thickening. Mild left colonic diverticulosis. Vascular/Lymphatic: Normal caliber abdominal aorta. Patent portal, splenic, hepatic and renal veins. No pathologically enlarged lymph nodes in the abdomen. Other: No abdominal ascites or focal fluid collection. Musculoskeletal: No aggressive appearing focal osseous lesions. IMPRESSION: 1. Cirrhosis. Posterior segment 2 left liver lobe 2.0 cm mass and inferior segment 3 left liver lobe 1.7 cm mass, both with avid arterial hyperenhancement, both considered LR-5 ([place]. 2. No ascites. 3. Cholelithiasis. No biliary ductal dilatation. No choledocholithiasis. 4. Small hiatal hernia. 5. Mild left colonic diverticulosis. Electronically Signed   By: JIlona SorrelM.D.   On: 07/23/2019 14:07   IR Radiologist Eval & Mgmt  Result Date: 08/28/2019 Please refer to notes tab for details about interventional procedure. (Op Note)     ASSESSMENT & PLAN:  1. Hepatocellular carcinoma ([place]   2. History of gout   3. Hepatic cirrhosis, unspecified hepatic cirrhosis type, unspecified whether ascites present ([place]   4. Goals of care, counseling/discussion    # Hepatocellular carcinoma.  MRI image was independently reviewed and discussed with patient. MRI findings and elevated AFP are diagnostic for HCC.  CT chest showed no left lower lobe opacity, likely sequela of prior infection/inflammation.  Attention on follow-up. She was deemed not to be a good candidate for surgical resection or transplant. No child Pugh score calculation due to no recent INR.  Patient is on apixaban. I have discussed with Dr. MLaurence Ferrariwho met this patient virtually this morning for  consultation of local regional therapy. She was recommended  to have transarterial radio embolization segmentectomy of hepatic segment 2 and 3  #Goals of care was discussed with patient.  She understands that hepatocellular carcinoma is an aggressive disease.  For now it appears that she has liver confined disease and local regional therapy is with curative intent. Her overall performance status is poor and she lives by herself..  We discussed about the future possibility of disease progression, metastasis after local regional therapy.  I recommend patient to establish care with palliative care service for discussion of goals of care, CODE STATUS.  #Uncontrolled diabetes, A1c in April was 11.6.  Patient is on Metformin,glimepiride.  She follows up with primary care doctor in July for further adjustment of diabetes care. #History of gout, continue allopurinol. Follow up: to be determined.  All questions were answered. The patient knows to call the clinic with any problems questions or concerns. Earlie Server, MD, PhD Hematology Oncology [facility] at [facility] Pager- [phone] 08/28/2019

## 2019-08-28 NOTE — Progress Notes (Signed)
Patient here for follow up. No concerns voiced.  °

## 2019-08-28 NOTE — Consult Note (Signed)
Chief Complaint: Patient was consulted remotely today (TeleHealth) for alcoholic cirrhosis complicated by hepatocellular carcinoma at the request of Yu,Zhou.    Referring Physician(s): Yu,Zhou  History of Present Illness: Adriana Miller is a 64 y.o. female with a history of alcoholic cirrhosis complicated by portal hypertensive gastropathy with bleeding gastric erosions and newly diagnosed hepatocellular carcinoma with lesions in segments 2 (2.0 x 1.6 cm) and 3 (1.7 x 1.3 cm) of the left hepatic lobe.  The diagnosis was made by MRI imaging on 07/23/2019.  Both of these lesions have imaging characteristics consistent with LR 5 (definitively hepatocellular carcinoma).  There is a third small lesion also in the left hepatic lobe with similar imaging features but smaller in size and not definitive by imaging but highly suspicious for a third focus of hepatocellular carcinoma.  She was still actively drinking until her most recent hospital admission at which time she was advised to pursue abstinence.  Today, she is in her usual state of health.  She denies abdominal pain, new weight loss, fever, chills or other systemic issues.  She lives by herself and is ambulatory and able to dress herself.  However, she has an aide who helps her with her activities of daily living including cooking and cleaning.    Past Medical History:  Diagnosis Date  . B12 deficiency 04/05/2017  . Blood in stool   . Cerebral aneurysm   . Chronic kidney disease   . Diabetes mellitus without complication ([place])   . Gastric erosion with bleeding   . Hyperlipidemia   . Hypertension   . Lower extremity cellulitis 02/08/2019  . Seizure disorder [facility])     Past Surgical History:  Procedure Laterality Date  . CEREBRAL ANEURYSM REPAIR  1991  . COLONOSCOPY WITH PROPOFOL N/A 02/12/2019   Procedure: COLONOSCOPY WITH PROPOFOL;  Surgeon: Lin Landsman, MD;  Location: [facility] ENDOSCOPY;  Service: Gastroenterology;   Laterality: N/A;  . ESOPHAGOGASTRODUODENOSCOPY (EGD) WITH PROPOFOL N/A 02/12/2019   Procedure: ESOPHAGOGASTRODUODENOSCOPY (EGD) WITH PROPOFOL;  Surgeon: Lin Landsman, MD;  Location: [facility] ENDOSCOPY;  Service: Gastroenterology;  Laterality: N/A;    Allergies: Aspirin  Medications: Prior to Admission medications   Medication Sig Start Date End Date Taking? Authorizing Provider  allopurinol (ZYLOPRIM) 100 MG tablet Take 1 tablet (100 mg total) by mouth daily. 08/02/19   Kendell Bane, NP  apixaban (ELIQUIS) 5 MG TABS tablet Take 1 tablet (5 mg total) by mouth 2 (two) times daily. 06/12/19   Loel Dubonnet, NP  diltiazem (CARTIA XT) 240 MG 24 hr capsule Take 1 capsule (240 mg total) by mouth daily. 07/26/19 07/25/20  Vashti Hey, MD  escitalopram (LEXAPRO) 10 MG tablet Take 1 tablet (10 mg total) by mouth daily. For anxiety/sleep 03/20/19   Lavera Guise, MD  furosemide (LASIX) 20 MG tablet Take 20 mg by mouth daily.    [provider]  glimepiride (AMARYL) 1 MG tablet Take 1 tablet (1 mg total) by mouth every morning. 08/02/19 08/01/20  Kendell Bane, NP  metFORMIN (GLUCOPHAGE) 500 MG tablet Take 1 tablet (500 mg total) by mouth daily with breakfast. 08/02/19 08/01/20  Kendell Bane, NP  metoprolol tartrate (LOPRESSOR) 25 MG tablet Take 25 mg by mouth daily.    [provider]  mirtazapine (REMERON) 7.5 MG tablet Take 1 tablet (7.5 mg total) by mouth at bedtime. 08/02/19   Kendell Bane, NP  spironolactone (ALDACTONE) 25 MG tablet Take 1 tablet (25 mg  total) by mouth daily. For cirrhosis of liver 06/19/19   Kendell Bane, NP     Family History  Problem Relation Age of Onset  . Heart attack Brother 73    Social History   Socioeconomic History  . Marital status: Divorced    Spouse name: Not on file  . Number of children: Not on file  . Years of education: Not on file  . Highest education level: Not on file  Occupational History  . Not on file   Tobacco Use  . Smoking status: Never Smoker  . Smokeless tobacco: Current User    Types: Snuff  Substance and Sexual Activity  . Alcohol use: Not Currently    Alcohol/week: 14.0 standard drinks    Types: 14 Cans of beer per week    Comment: patient quit in october 2020  . Drug use: No  . Sexual activity: Not Currently  Other Topics Concern  . Not on file  Social History Narrative  . Not on file   Social Determinants of Health   Financial Resource Strain:   . Difficulty of Paying Living Expenses:   Food Insecurity:   . Worried About Charity fundraiser in the Last Year:   . Arboriculturist in the Last Year:   Transportation Needs:   . Film/video editor (Medical):   Marland Kitchen Lack of Transportation (Non-Medical):   Physical Activity:   . Days of Exercise per Week:   . Minutes of Exercise per Session:   Stress:   . Feeling of Stress :   Social Connections:   . Frequency of Communication with Friends and Family:   . Frequency of Social Gatherings with Friends and Family:   . Attends Religious Services:   . Active Member of Clubs or Organizations:   . Attends Archivist Meetings:   Marland Kitchen Marital Status:     ECOG Status: 1 - Symptomatic but completely ambulatory  Review of Systems  Review of Systems: A 12 point ROS discussed and pertinent positives are indicated in the HPI above.  All other systems are negative.  Physical Exam No direct physical exam was performed (except for noted visual exam findings with Video Visits).   Vital Signs: There were no vitals taken for this visit.  Imaging: CT Chest Wo Contrast  Result Date: 08/17/2019 CLINICAL DATA:  History of hepatocellular carcinoma by imaging. EXAM: CT CHEST WITHOUT CONTRAST TECHNIQUE: Multidetector CT imaging of the chest was performed following the standard protocol without IV contrast. COMPARISON:  Prior abdominal imaging. FINDINGS: Cardiovascular: Calcified atheromatous plaque in the thoracic aorta.  Limited assessment of vascular structures due to lack of intravenous contrast. Mitral annular calcification. The coronary artery calcification, three-vessel coronary artery disease. No pericardial effusion. Mediastinum/Nodes: Thoracic inlet structures are normal. No axillary lymphadenopathy. No mediastinal lymphadenopathy. Mild enlargement of subcarinal nodal tissue less than a cm, 9 mm. No gross hilar adenopathy limited assessment of hilar structures due to lack of intravenous contrast. Lungs/Pleura: Branching parenchymal opacity at the LEFT lung base appears to follow bronchovascular structures and may be associated with subtle calcification in addition to branching morphology. 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 Airways are patent. Upper Abdomen: Lobular hepatic contours. Compatible with known cirrhosis. Mass lesions in this patient are not well visualized on noncontrast imaging. Spleen is enlarged. Nephrolithiasis on the RIGHT 4 mm calculus in the upper pole. Small renal cysts partially visualized. Musculoskeletal: No acute bone finding. No destructive bone process. IMPRESSION: 1. LEFT lower lobe  opacity with branching characteristics likely sequela of prior infection or inflammation. Attention on follow-up. This shows a geographic rather than focal pattern. Consider follow-up in 3-6 months or comparison with prior imaging to confirm and or establish 2. Lobular hepatic contours, consistent with known cirrhosis. Mass lesions in this patient are not well visualized on noncontrast imaging. No signs of metastatic disease to the chest. 3. Splenomegaly. 4. Right nephrolithiasis. 5. Aortic atherosclerosis. Aortic Atherosclerosis (ICD10-I70.0). Electronically Signed   By: Zetta Bills M.D.   On: 08/17/2019 15:56    Labs:  CBC: Recent Labs    06/22/19 1546 06/27/19 1024 07/25/19 1436 07/26/19 0807  WBC 5.5 5.2 7.8 6.3  HGB 12.8 11.7* 13.7 11.2*  HCT 36.8 36.7 40.3 32.7*  PLT 173 132* 158 141*    COAGS:  Recent Labs    02/08/19 1551  INR 2.6*    BMP: Recent Labs    06/06/19 1538 06/27/19 1024 07/25/19 1436 07/26/19 0807  NA 140 134* 126* 132*  K 4.7 4.3 4.2 3.8  CL 102 100 87* 102  CO2 21 24 25 22   GLUCOSE 215* 318* 621* 312*  BUN 24 24* 27* 20  CALCIUM 9.5 9.2 9.8 8.7*  CREATININE 1.24* 1.12* 1.73* 1.20*  GFRNONAA 46* 52* 30* 47*  GFRAA 53* 60* 35* 55*    LIVER FUNCTION TESTS: Recent Labs    02/09/19 0634 03/28/19 1700 06/27/19 1024 07/26/19 0807  BILITOT 2.2* 2.4* 0.8 1.3*  AST 45* 42* 24 21  ALT 18 16 16 14   ALKPHOS 34* 56 85 75  PROT 6.5 9.3* 8.7* 7.6  ALBUMIN 2.1* 2.8* 3.1* 2.8*    TUMOR MARKERS: No results for input(s): AFPTM, CEA, CA199, CHROMGRNA in the last 8760 hours.  Assessment and Plan:  64 year old female with a history of alcoholic cirrhosis now complicated by 2 small enhancing lesions in the left liver (segment 2 and segment 3) with LR-5 imaging features diagnostic of hepatocellular carcinoma.  Additionally, there is a third arterially enhancing lesion in the left hepatic lobe which does not quite meet size criteria for imaging diagnosis but also very likely represents a third focus of hepatocellular carcinoma.  Overall, her performance status is relatively poor.  She lives by herself but has an aide who helps her with her cooking, laundry and cleaning.  She is ambulatory and able to dress herself.  She was evaluated by hepatobiliary surgery at [facility] in [facility] and was deemed a nonsurgical candidate.  She is a candidate for liver directed therapy and would like to keep her care closer to home.  Most recent bilirubin was 1.3.  Creatinine is mildly elevated at 1.2.   Given the fact that she likely has 3 lesions in the left hemiliver, percutaneous ablation with microwave is a consideration but would be challenging.  She may be better served by transarterial radio embolization segmentectomy of hepatic segments 2 and 3 which should provide a curative  therapy for all 3 lesions.    I explained the risks, benefits and alternatives to Mrs. Adriana Miller on over the phone.  She understands that she would be able to go home the same day of the procedure and would likely have 7 to 10 days of fatigue, mild abdominal pain, decreased appetite and general malaise.  She understands and wishes to proceed.  1.)  Please schedule for preplanning Y 90 arteriogram to be followed by left lobar segmentectomy of segments 2 and 3 (split dose) to be performed at [facility].   Thank  you for this interesting consult.  I greatly enjoyed meeting Adriana Miller and look forward to participating in their care.  A copy of this report was sent to the requesting provider on this date.  Electronically Signed: Jacqulynn Cadet 08/28/2019, 12:25 PM   I spent a total of  30 Minutes  in remote  clinical consultation, greater than 50% of which was counseling/coordinating care for hepatocellular carcinoma.    Visit type: Audio only (telephone). Audio (no video) only due to patient preference. . Alternative for in-person consultation at [facility], [place] [address] [place], [place], [place]. This visit type was conducted due to national recommendations for restrictions regarding the COVID-19 Pandemic (e.g. social distancing).  This format is felt to be most appropriate for this patient at this time.  All issues noted in this document were discussed and addressed.

## 2019-08-31 ENCOUNTER — Telehealth: Payer: Self-pay

## 2019-08-31 NOTE — Telephone Encounter (Signed)
Awaiting Y90 scheduling. Will follow up.

## 2019-09-05 ENCOUNTER — Other Ambulatory Visit: Payer: Self-pay

## 2019-09-05 ENCOUNTER — Inpatient Hospital Stay (HOSPITAL_BASED_OUTPATIENT_CLINIC_OR_DEPARTMENT_OTHER): Payer: Medicare Other | Admitting: Hospice and Palliative Medicine

## 2019-09-05 ENCOUNTER — Encounter: Payer: Self-pay | Admitting: Hospice and Palliative Medicine

## 2019-09-05 VITALS — BP 130/76 | HR 67 | Temp 96.0°F | Resp 18 | Wt 169.6 lb

## 2019-09-05 DIAGNOSIS — Z8249 Family history of ischemic heart disease and other diseases of the circulatory system: Secondary | ICD-10-CM | POA: Diagnosis not present

## 2019-09-05 DIAGNOSIS — Z515 Encounter for palliative care: Secondary | ICD-10-CM | POA: Diagnosis not present

## 2019-09-05 DIAGNOSIS — C220 Liver cell carcinoma: Secondary | ICD-10-CM | POA: Diagnosis not present

## 2019-09-05 DIAGNOSIS — Z7984 Long term (current) use of oral hypoglycemic drugs: Secondary | ICD-10-CM | POA: Diagnosis not present

## 2019-09-05 DIAGNOSIS — N189 Chronic kidney disease, unspecified: Secondary | ICD-10-CM | POA: Diagnosis not present

## 2019-09-05 DIAGNOSIS — Z7901 Long term (current) use of anticoagulants: Secondary | ICD-10-CM | POA: Diagnosis not present

## 2019-09-05 DIAGNOSIS — E785 Hyperlipidemia, unspecified: Secondary | ICD-10-CM | POA: Diagnosis not present

## 2019-09-05 DIAGNOSIS — Z79899 Other long term (current) drug therapy: Secondary | ICD-10-CM | POA: Diagnosis not present

## 2019-09-05 DIAGNOSIS — E538 Deficiency of other specified B group vitamins: Secondary | ICD-10-CM | POA: Diagnosis not present

## 2019-09-05 DIAGNOSIS — E1165 Type 2 diabetes mellitus with hyperglycemia: Secondary | ICD-10-CM | POA: Diagnosis not present

## 2019-09-05 DIAGNOSIS — I10 Essential (primary) hypertension: Secondary | ICD-10-CM | POA: Diagnosis not present

## 2019-09-05 MED ORDER — TRAZODONE HCL 50 MG PO TABS: 25.0000 mg | ORAL_TABLET | Freq: Every evening | ORAL | 2 refills | Status: DC | PRN

## 2019-09-05 NOTE — Progress Notes (Signed)
[facility]  [phone] [phone]   Name: Adriana Miller Date: 09/05/2019 MRN: [id number]  DOB: February 28, 1956  Patient Care Team: Ronnell Freshwater, NP as PCP - General (Family Medicine) End, Harrell Gave, MD as PCP - Cardiology (Cardiology) Clent Jacks, RN as Oncology Nurse Navigator    REASON FOR CONSULTATION: Adriana Miller is a 64 y.o. female with multiple medical problems including [place], cirrhosis secondary to alcohol abuse, history of gastric erosion with bleeding, CKD, history of poorly controlled diabetes, cerebral aneurysm, and seizure disorder.  Patient's HCC was deemed not surgically resectable nor was patient felt to be a transplant candidate.  Patient was referred for consideration of embolization.  Patient has relatively poor performance status with limited social support.  Appear to help address goals and manage ongoing symptoms.  SOCIAL HISTORY:     reports that she has never smoked. Her smokeless tobacco use includes snuff. She reports previous alcohol use of about 14.0 standard drinks of alcohol per week. She reports that she does not use drugs.   Patient is divorced.  She lives at home alone.  She has a Medicaid CNA 2 hours/day.  She has a brother who lives nearby.  She has a son and granddaughter and [place].  Patient is originally from [place] but moved here about 7 years ago to be near her brother.  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  ADVANCE DIRECTIVES:  Does not have  CODE STATUS: DNR  PAST MEDICAL HISTORY: Past Medical History:  Diagnosis Date  . B12 deficiency 04/05/2017  . Blood in stool   . Cerebral aneurysm   . Chronic kidney disease   . Diabetes mellitus without complication ([place])   . Gastric erosion with bleeding   . Hyperlipidemia   . Hypertension   . Lower extremity cellulitis 02/08/2019  . Seizure disorder ([place])     PAST  SURGICAL HISTORY:  Past Surgical History:  Procedure Laterality Date  . CEREBRAL ANEURYSM REPAIR  1991  . COLONOSCOPY WITH PROPOFOL N/A 02/12/2019   Procedure: COLONOSCOPY WITH PROPOFOL;  Surgeon: Lin Landsman, MD;  Location[facility] ENDOSCOPY;  Service: Gastroenterology;  Laterality: N/A;  . ESOPHAGOGASTRODUODENOSCOPY (EGD) WITH PROPOFOL N/A 02/12/2019   Procedure: ESOPHAGOGASTRODUODENOSCOPY (EGD) WITH PROPOFOL;  Surgeon: Lin Landsman, MD;  Location: [facility] ENDOSCOPY;  Service: Gastroenterology;  Laterality: N/A;  . IR RADIOLOGIST EVAL & MGMT  08/28/2019    HEMATOLOGY/ONCOLOGY HISTORY:  Oncology History   No history exists.    ALLERGIES:  is allergic to aspirin.  MEDICATIONS:  Current Outpatient Medications  Medication Sig Dispense Refill  . allopurinol (ZYLOPRIM) 100 MG tablet Take 1 tablet (100 mg total) by mouth daily. 90 tablet 1  . apixaban (ELIQUIS) 5 MG TABS tablet Take 1 tablet (5 mg total) by mouth 2 (two) times daily. 180 tablet 2  . diltiazem (CARTIA XT) 240 MG 24 hr capsule Take 1 capsule (240 mg total) by mouth daily. 30 capsule 11  . escitalopram (LEXAPRO) 10 MG tablet Take 1 tablet (10 mg total) by mouth daily. For anxiety/sleep 90 tablet 2  . furosemide (LASIX) 20 MG tablet Take 20 mg by mouth daily.    Marland Kitchen glimepiride (AMARYL) 1 MG tablet Take 1 tablet (1 mg total) by mouth every morning. 90 tablet 0  . metFORMIN (GLUCOPHAGE) 500 MG tablet Take 1 tablet (500 mg total) by mouth daily with breakfast. 90 tablet 0  .  metoprolol tartrate (LOPRESSOR) 25 MG tablet Take 25 mg by mouth daily.    . mirtazapine (REMERON) 7.5 MG tablet Take 1 tablet (7.5 mg total) by mouth at bedtime. 90 tablet 1  . spironolactone (ALDACTONE) 25 MG tablet Take 1 tablet (25 mg total) by mouth daily. For cirrhosis of liver 90 tablet 2   No current facility-administered medications for this visit.    VITAL SIGNS: There were no vitals taken for this visit. There were no vitals filed for  this visit.  Estimated body mass index is 26.84 kg/m as calculated from the following:   Height as of 08/02/19: _0  (1.676 m).   Weight as of 08/28/19: 166 lb 4.8 oz (75.4 kg).  LABS: CBC:    Component Value Date/Time   WBC 6.3 07/26/2019 0807   HGB 11.2 (L) 07/26/2019 0807   HGB 12.8 06/22/2019 1546   HCT 32.7 (L) 07/26/2019 0807   HCT 36.8 06/22/2019 1546   PLT 141 (L) 07/26/2019 0807   PLT 173 06/22/2019 1546   MCV 90.1 07/26/2019 0807   MCV 92 06/22/2019 1546   MCV 98 10/11/2013 1705   NEUTROABS 2.9 06/27/2019 1024   NEUTROABS 3.6 06/22/2019 1546   LYMPHSABS 1.8 06/27/2019 1024   LYMPHSABS 1.4 06/22/2019 1546   MONOABS 0.5 06/27/2019 1024   EOSABS 0.0 06/27/2019 1024   EOSABS 0.1 06/22/2019 1546   BASOSABS 0.0 06/27/2019 1024   BASOSABS 0.0 06/22/2019 1546   Comprehensive Metabolic Panel:    Component Value Date/Time   NA 132 (L) 07/26/2019 0807   NA 140 06/06/2019 1538   NA 138 10/11/2013 1705   K 3.8 07/26/2019 0807   K 3.9 10/11/2013 1705   CL 102 07/26/2019 0807   CL 106 10/11/2013 1705   CO2 22 07/26/2019 0807   CO2 24 10/11/2013 1705   BUN 20 07/26/2019 0807   BUN 24 06/06/2019 1538   BUN 13 10/11/2013 1705   CREATININE 1.20 (H) 07/26/2019 0807   CREATININE 0.87 10/11/2013 1705   GLUCOSE 312 (H) 07/26/2019 0807   GLUCOSE 235 (H) 10/11/2013 1705   CALCIUM 8.7 (L) 07/26/2019 0807   CALCIUM 8.4 (L) 10/11/2013 1705   AST 21 07/26/2019 0807   ALT 14 07/26/2019 0807   ALKPHOS 75 07/26/2019 0807   BILITOT 1.3 (H) 07/26/2019 0807   BILITOT 0.5 01/27/2018 1340   PROT 7.6 07/26/2019 0807   PROT 8.4 01/27/2018 1340   ALBUMIN 2.8 (L) 07/26/2019 0807   ALBUMIN 4.2 01/27/2018 1340    RADIOGRAPHIC STUDIES: CT Chest Wo Contrast  Result Date: 08/17/2019 CLINICAL DATA:  History of hepatocellular carcinoma by imaging. EXAM: CT CHEST WITHOUT CONTRAST TECHNIQUE: Multidetector CT imaging of the chest was performed following the standard protocol without IV  contrast. COMPARISON:  Prior abdominal imaging. FINDINGS: Cardiovascular: Calcified atheromatous plaque in the thoracic aorta. Limited assessment of vascular structures due to lack of intravenous contrast. Mitral annular calcification. The coronary artery calcification, three-vessel coronary artery disease. No pericardial effusion. Mediastinum/Nodes: Thoracic inlet structures are normal. No axillary lymphadenopathy. No mediastinal lymphadenopathy. Mild enlargement of subcarinal nodal tissue less than a cm, 9 mm. No gross hilar adenopathy limited assessment of hilar structures due to lack of intravenous contrast. Lungs/Pleura: Branching parenchymal opacity at the LEFT lung base appears to follow bronchovascular structures and may be associated with subtle calcification in addition to branching morphology. 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 Airways are patent. Upper Abdomen: Lobular hepatic contours. Compatible with known cirrhosis. Mass lesions in this  patient are not well visualized on noncontrast imaging. Spleen is enlarged. Nephrolithiasis on the RIGHT 4 mm calculus in the upper pole. Small renal cysts partially visualized. Musculoskeletal: No acute bone finding. No destructive bone process. IMPRESSION: 1. LEFT lower lobe opacity with branching characteristics likely sequela of prior infection or inflammation. Attention on follow-up. This shows a geographic rather than focal pattern. Consider follow-up in 3-6 months or comparison with prior imaging to confirm and or establish 2. Lobular hepatic contours, consistent with known cirrhosis. Mass lesions in this patient are not well visualized on noncontrast imaging. No signs of metastatic disease to the chest. 3. Splenomegaly. 4. Right nephrolithiasis. 5. Aortic atherosclerosis. Aortic Atherosclerosis (ICD10-I70.0). Electronically Signed   By: Zetta Bills M.D.   On: 08/17/2019 15:56   IR Radiologist Eval & Mgmt  Result Date: 08/28/2019 Please refer to notes tab  for details about interventional procedure. (Op Note)   PERFORMANCE STATUS (ECOG) : 2 - Symptomatic, <50% confined to bed  Review of Systems Unless otherwise noted, a complete review of systems is negative.  Physical Exam General: NAD, frail appearing Pulmonary: Unlabored Extremities: no edema, no joint deformities Skin: no rashes Neurological: Weakness but otherwise nonfocal  IMPRESSION: I met with patient in clinic today.  I introduced palliative care services and attempted to establish therapeutic rapport.  Patient is pending embolization for her [facility], which is being done hopefully with curative intent.  Patient seems to have a reasonable understanding of her current health problems although she says she is often forgetful.  Patient's goals seem aligned with proceeding with embolization.   Patient has limited social support and fairly poor performance status.  She lives at home alone but has a CNA 2 hours each day that provides her with substantial care including cooking, cleaning, and bathing.  Patient is ambulatory in the home with use of a cane.  She says her brother is also involved in her care.  Patient has a life alert system.  We will order community-based palliative care to follow.  She denies any distressing symptoms today.  She endorses chronic insomnia.  She says often she only gets a couple of hours of sleep at night but chronically naps during the daytime.  We discussed the importance of good sleep hygiene.  Patient was started on mirtazapine 7.5 mg and says that helped initially but she no longer finds it effective.  She does also report significant weight gain and for that reason is not interested in continuing the medication.  Will discontinue mirtazapine and switch to low-dose trazodone.  Discussed the importance of fall safety precautions at home, particularly on sedating medications.  Patient is interested in establishing advanced directives in ACP documents were reviewed  today.  She would want her brother to be her healthcare power of attorney with her son to be backup.  We discussed CODE STATUS.  Patient says that she would not want to be resuscitated or have her life prolonged artificially machines.  She says she would not want to be revived "only to die later."  Patient says that she has not had any conversations with her family regarding her wishes and I encouraged her to do so.  I attempted to complete a MOST form today but patient did not have her glasses and wanted to take this home to review.  We will ask that community palliative care help with completing ACP/MOST form.  PLAN: -Best supportive care -DC mirtazapine -Start trazodone 25 to 50 mg nightly as needed for insomnia -  ACP/MOST form reviewed -Patient verbalized a desire to be a DNR -Referral to community-based palliative care -RTC as needed   Patient expressed understanding and was in agreement with this plan. She also understands that She can call the clinic at any time with any questions, concerns, or complaints.     Time Total: 45 minutes  Visit consisted of counseling and education dealing with the complex and emotionally intense issues of symptom management and palliative care in the setting of serious and potentially life-threatening illness.Greater than 50%  of this time was spent counseling and coordinating care related to the above assessment and plan.  Signed by: Altha Harm, PhD, NP-C

## 2019-09-05 NOTE — Progress Notes (Signed)
Patient reports trouble sleeping.  She does have Remeron (out for 3 days) that did not help much.

## 2019-09-07 ENCOUNTER — Telehealth: Payer: Self-pay

## 2019-09-07 NOTE — Telephone Encounter (Signed)
Voicemail left with [facility] to inquire regarding Y90 preplanning. She was seen in consult by Dr. Laurence Ferrari on 08/28/19.

## 2019-09-11 ENCOUNTER — Other Ambulatory Visit (HOSPITAL_COMMUNITY): Payer: Self-pay | Admitting: Interventional Radiology

## 2019-09-11 DIAGNOSIS — C220 Liver cell carcinoma: Secondary | ICD-10-CM

## 2019-09-20 ENCOUNTER — Other Ambulatory Visit: Payer: Self-pay | Admitting: Radiology

## 2019-09-21 ENCOUNTER — Ambulatory Visit (HOSPITAL_COMMUNITY)
Admission: RE | Admit: 2019-09-21 | Discharge: 2019-09-21 | Disposition: A | Discharge status: Home / Self Care | Payer: Medicare Other | Source: Ambulatory Visit | Attending: Interventional Radiology | Admitting: Interventional Radiology

## 2019-09-21 ENCOUNTER — Encounter (HOSPITAL_COMMUNITY)
Admission: RE | Admit: 2019-09-21 | Discharge: 2019-09-21 | Disposition: A | Discharge status: Home / Self Care | Payer: Medicare Other | Source: Ambulatory Visit | Attending: Interventional Radiology | Admitting: Interventional Radiology

## 2019-09-21 ENCOUNTER — Other Ambulatory Visit (HOSPITAL_COMMUNITY): Payer: Self-pay | Admitting: Interventional Radiology

## 2019-09-21 ENCOUNTER — Encounter (HOSPITAL_COMMUNITY): Payer: Self-pay

## 2019-09-21 ENCOUNTER — Telehealth: Payer: Self-pay

## 2019-09-21 ENCOUNTER — Other Ambulatory Visit: Payer: Self-pay

## 2019-09-21 DIAGNOSIS — E538 Deficiency of other specified B group vitamins: Secondary | ICD-10-CM | POA: Diagnosis not present

## 2019-09-21 DIAGNOSIS — Z7984 Long term (current) use of oral hypoglycemic drugs: Secondary | ICD-10-CM | POA: Insufficient documentation

## 2019-09-21 DIAGNOSIS — C220 Liver cell carcinoma: Secondary | ICD-10-CM

## 2019-09-21 DIAGNOSIS — E785 Hyperlipidemia, unspecified: Secondary | ICD-10-CM | POA: Diagnosis not present

## 2019-09-21 DIAGNOSIS — N189 Chronic kidney disease, unspecified: Secondary | ICD-10-CM | POA: Diagnosis not present

## 2019-09-21 DIAGNOSIS — Z79899 Other long term (current) drug therapy: Secondary | ICD-10-CM | POA: Diagnosis not present

## 2019-09-21 DIAGNOSIS — I129 Hypertensive chronic kidney disease with stage 1 through stage 4 chronic kidney disease, or unspecified chronic kidney disease: Secondary | ICD-10-CM | POA: Insufficient documentation

## 2019-09-21 DIAGNOSIS — E1122 Type 2 diabetes mellitus with diabetic chronic kidney disease: Secondary | ICD-10-CM | POA: Insufficient documentation

## 2019-09-21 DIAGNOSIS — R569 Unspecified convulsions: Secondary | ICD-10-CM | POA: Diagnosis not present

## 2019-09-21 DIAGNOSIS — Z7901 Long term (current) use of anticoagulants: Secondary | ICD-10-CM | POA: Diagnosis not present

## 2019-09-21 HISTORY — PX: IR US GUIDE VASC ACCESS LEFT: IMG2389

## 2019-09-21 HISTORY — PX: IR ANGIOGRAM SELECTIVE EACH ADDITIONAL VESSEL: IMG667

## 2019-09-21 HISTORY — PX: IR ANGIOGRAM VISCERAL SELECTIVE: IMG657

## 2019-09-21 LAB — BASIC METABOLIC PANEL
Anion gap: 12 (ref 5–15)
BUN: 25 mg/dL — ABNORMAL HIGH (ref 8–23)
CO2: 25 mmol/L (ref 22–32)
Calcium: 9.5 mg/dL (ref 8.9–10.3)
Chloride: 102 mmol/L (ref 98–111)
Creatinine, Ser: 1 mg/dL (ref 0.44–1.00)
GFR calc Af Amer: >60 mL/min (ref >60)
GFR calc non Af Amer: 59 mL/min — ABNORMAL LOW (ref >60)
Glucose, Bld: 126 mg/dL — ABNORMAL HIGH (ref 70–99)
Potassium: 4 mmol/L (ref 3.5–5.1)
Sodium: 139 mmol/L (ref 135–145)

## 2019-09-21 LAB — CBC
HCT: 37.1 % (ref 36.0–46.0)
Hemoglobin: 12.3 g/dL (ref 12.0–15.0)
MCH: 31.2 pg (ref 26.0–34.0)
MCHC: 33.2 g/dL (ref 30.0–36.0)
MCV: 94.2 fL (ref 80.0–100.0)
Platelets: 176 10*3/uL (ref 150–400)
RBC: 3.94 MIL/uL (ref 3.87–5.11)
RDW: 13.7 % (ref 11.5–15.5)
WBC: 7.2 10*3/uL (ref 4.0–10.5)
nRBC: 0 % (ref 0.0–0.2)

## 2019-09-21 LAB — PROTIME-INR
INR: 1.1 (ref 0.8–1.2)
Prothrombin Time: 14.2 seconds (ref 11.4–15.2)

## 2019-09-21 LAB — GLUCOSE, CAPILLARY: Glucose-Capillary: 117 mg/dL — ABNORMAL HIGH (ref 70–99)

## 2019-09-21 LAB — BILIRUBIN, TOTAL: Total Bilirubin: 1 mg/dL (ref 0.3–1.2)

## 2019-09-21 IMAGING — NM NM MISC PROCEDURE
5 series · 20 of 20 positions shown · non-contrast
Comparison: none

INDICATION: 63-year-old female with multifocal hepatocellular carcinoma isolated
to the lateral segment of the left hepatic lobe. She presents for
pre [AGE] arterial mapping.

[Series 1: spect - (id) _(id)_cor · 4.1mm · 4.14mm/px · 6 of 128 frames shown]
[frame 11/128]
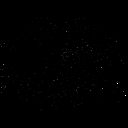
[frame 32/128]
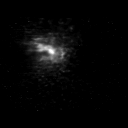
[frame 54/128]
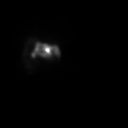
[frame 75/128]
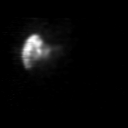
[frame 96/128]
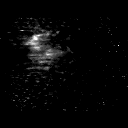
[frame 118/128]
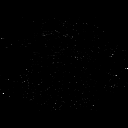

[Series 1: spect - (id) _(id)_tra · 4.1mm · 4.14mm/px · 6 of 128 frames shown]
[frame 11/128]
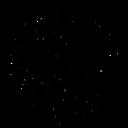
[frame 32/128]
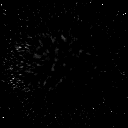
[frame 54/128]
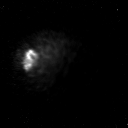
[frame 75/128]
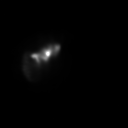
[frame 96/128]
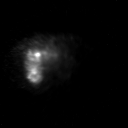
[frame 118/128]
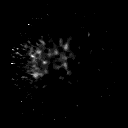

[Series 2: maa spect · 4.14mm/px · 6 of 64 frames shown]
[frame 6/64]
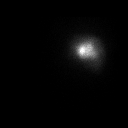
[frame 16/64]
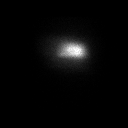
[frame 27/64]
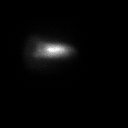
[frame 38/64]
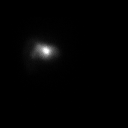
[frame 48/64]
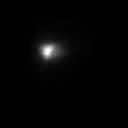
[frame 59/64]
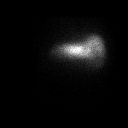

[Series 3: maa statics (2) · 2.07mm/px · 1 of 1 slices shown (1 of 2)]
[im 1/1]
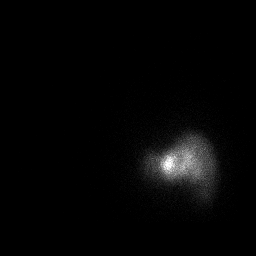

[Series 3: maa statics (2) · 2.07mm/px · 1 of 1 slices shown (2 of 2)]
[im 1/1]
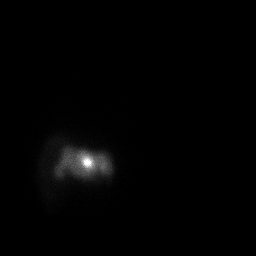

[20 of 20 positions shown; findings below may reference images not displayed]

EXAM:
1. Ultrasound-guided access left radial artery
2. Catheterization of the celiac axis with arteriogram
3. Catheterization of the superior mesenteric artery with
arteriogram
4. Catheterization of the replaced common hepatic artery with
arteriogram
5. Catheterization of the segment [DATE] left hepatic artery with
arteriogram
6. Catheterization of the segment 3 hepatic artery with arteriogram
7. Catheterization of the segment 2 left hepatic artery with
arteriogram

MEDICATIONS:
None

ANESTHESIA/SEDATION:
Moderate (conscious) sedation was employed during this procedure. A
total of Versed 2 mg and Fentanyl 100 mcg was administered
intravenously.

Moderate Sedation Time: 70 minutes. The patient's level of
consciousness and vital signs were monitored continuously by
radiology nursing throughout the procedure under my direct
supervision.

CONTRAST:  100 mL Isovue 300

FLUOROSCOPY TIME:  Fluoroscopy Time: 17 minutes 30 seconds (1,800
mGy).

COMPLICATIONS:
None immediate.



The left radial artery was interrogated with ultrasound and found to
be widely patent and of adequate diameter. An image was obtained and
stored for the medical record. Local anesthesia was attained by
infiltration with 1% lidocaine. Under real-time sonographic
guidance, the vessel was punctured with a 21 gauge micropuncture
needle. Using standard technique, the initial micro needle was
exchanged over a 0.021 micro wire for a hydrophilic 5 French slim
arterial sheath. The sheath was flushed and then a radial cocktail
consisting of [0O] units heparin, 2.5 mg Verapamil and 200 mcg
nitroglycerine was injected through the sheath.

An ultimate 2 catheter was advanced over a Bentson wire into the
abdominal aorta. The catheter was used to select the celiac artery.
An arteriogram was performed. There is variant hepatic arterial
anatomy. The celiac artery gives rise to the splenic artery, left
gastric artery and dorsal pancreatic artery. No evidence of hepatic
artery.

The catheter was next used to select the origin of the superior
mesenteric artery. Arteriography was performed. The common hepatic
arteries completely replaced to the SMA. A glidewire was
successfully advanced into the common hepatic artery and the
ultimate 1 catheter advanced over the wire into the artery. Common
hepatic arteriography was then performed in multiple obliquities.
Prominent supra duodenal artery arising just distal to the
gastroduodenal artery. The right gastric artery is visualized coming
off the proper hepatic artery.

A renegade STC microcatheter was advanced over a Fathom 16 wire and
used to select the segment [DATE] left hepatic artery. Arteriography
was performed. Two hypervascular tumors are present within the
segment 3 distribution. The microcatheter was successfully navigated
beyond the bifurcation into the segment 4 and 3 hepatic arteries and
into the segment 3 hepatic artery. Additional arteriography was
performed. Excellent tumor blush identified. No reflux. No stasis.
No evidence of extrahepatic arterial supply.

The microcatheter was then brought back into the proper hepatic
artery. After several attempts, the segment 2 hepatic artery was
successfully catheterized. Arteriography was performed demonstrating
hypervascular tumor blush in 2 lesions. No evidence of reflux,
stasis or extrahepatic arterial supply.

The microcatheter was brought back into the proper hepatic artery.
Extensive effort was then made to catheterize the right gastric
artery, however this was ultimately unsuccessful. It was felt
acceptable to leave the right gastric artery open as treatment would
be fairly distal in the segment 2 and 3 hepatic arteries and the
chance of reflux into the right gastric artery is near 0.

Therefore, technetium labeled macro aggregated albumin was
successfully injected into the left hepatic artery. The catheters
were removed. Hemostasis was attained with the assistance of a TR
band
IMPRESSION: 1. Variant anatomy with a completely replaced common hepatic artery
to the SMA.
2. Variant anatomy with two left-sided hepatic arteries, one
supplying segments 3 and 4 and one supplying segment 2.
3. Successful multivessel angiogram. There are 4 hypervascular
lesions in the lateral segment of the left hemi liver, two in
segment 2 and two in segment 3.

PLAN:
Return in 2 weeks for split dose segmentectomy targeting segments 2
and 3.

## 2019-09-21 IMAGING — XA IR US GUIDE VASC ACCESS LEFT
12 of 14 series · 12 of 24 positions shown · IV contrast (IODINE)
Comparison: none

INDICATION: 63-year-old female with multifocal hepatocellular carcinoma isolated
to the lateral segment of the left hepatic lobe. She presents for
pre [AGE] arterial mapping.

[Series 1: care body 4 · 1 of 21 frames shown (1 of 12)]
[frame 14/21]
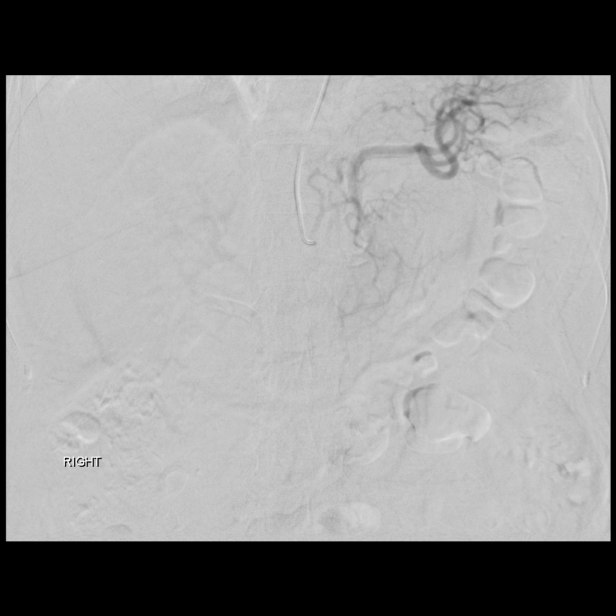

[Series 2: care body 4 · 1 of 21 frames shown (2 of 12)]
[frame 18/21]
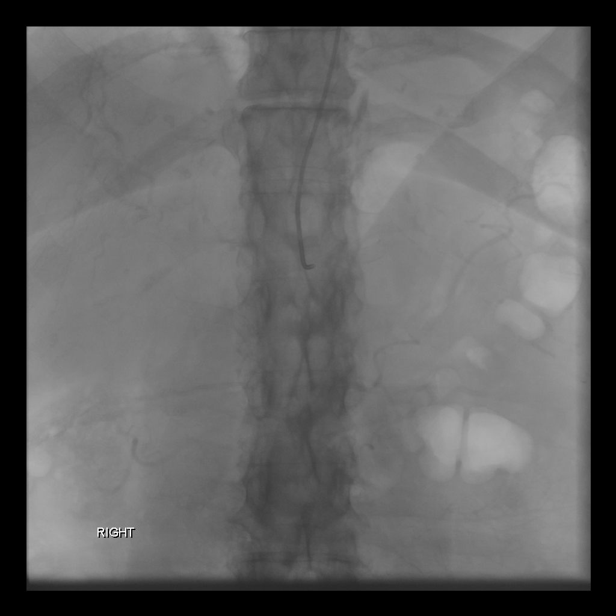

[Series 4: care body 4 · 1 of 68 frames shown (3 of 12)]
[frame 11/68]
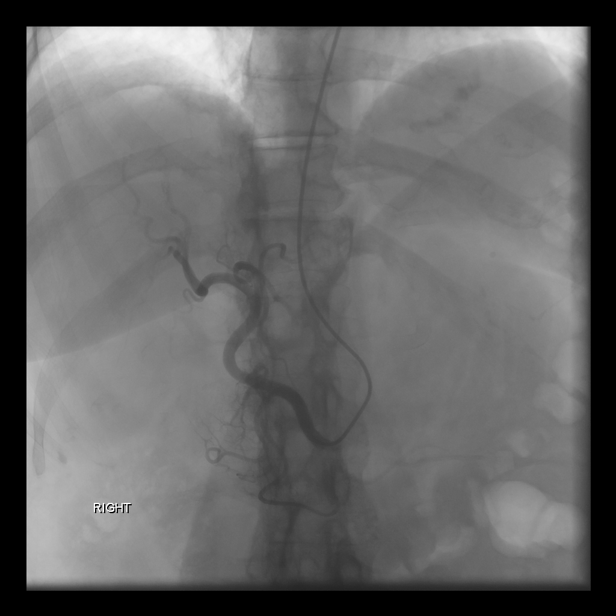

[Series 5: care body 4 · 1 of 47 frames shown (4 of 12)]
[frame 8/47]
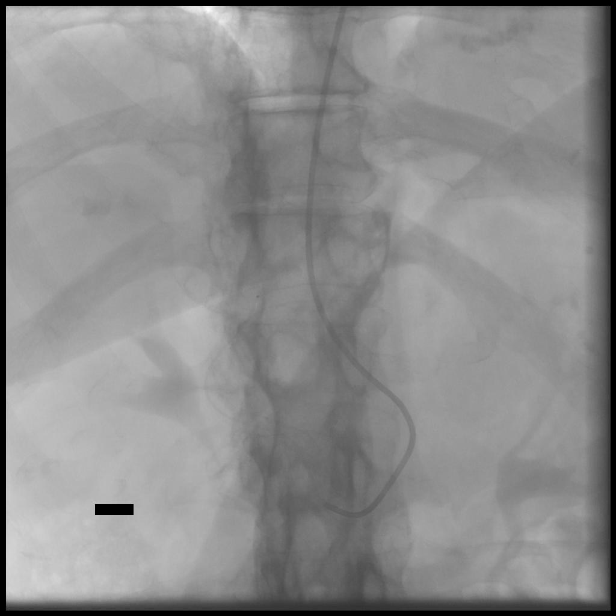

[Series 6: care body 4 · 1 of 45 frames shown (5 of 12)]
[frame 23/45]
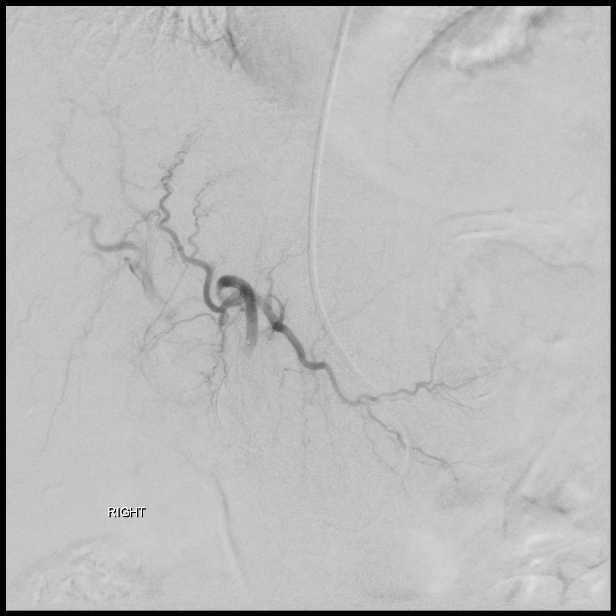

[Series 7: care body 4 · 1 of 37 frames shown (6 of 12)]
[frame 32/37]
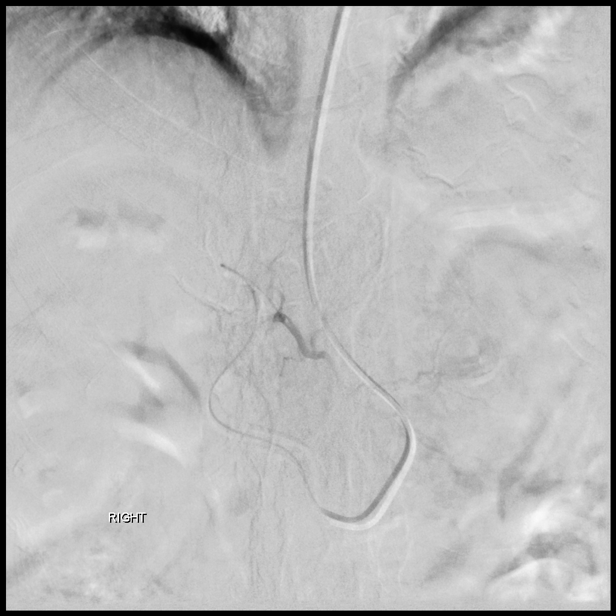

[Series 9: care body 4 · 1 of 28 frames shown (7 of 12)]
[frame 5/28]
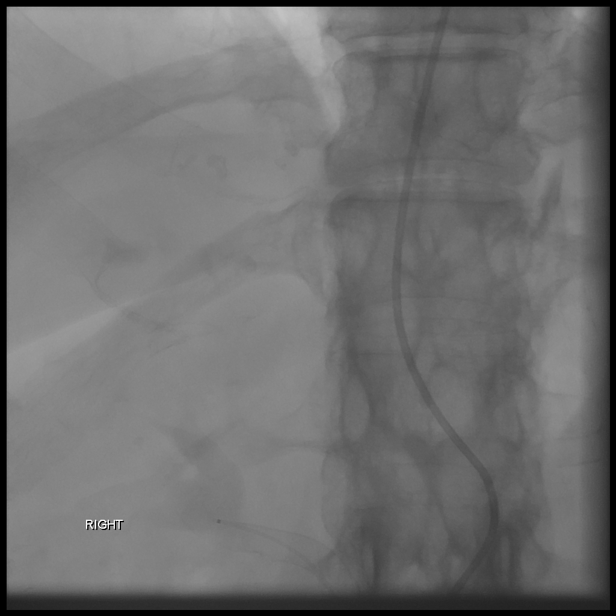

[Series 10: care body 4 · 1 of 21 frames shown (8 of 12)]
[frame 4/21]
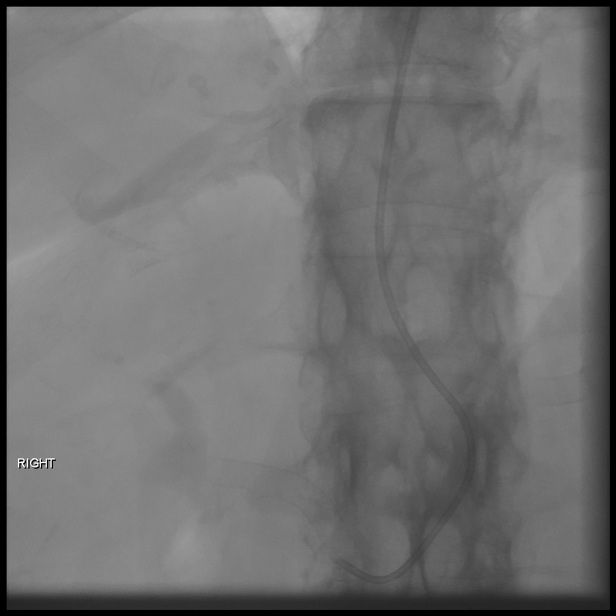

[Series 11: care body 4 · 1 of 22 frames shown (9 of 12)]
[frame 12/22]
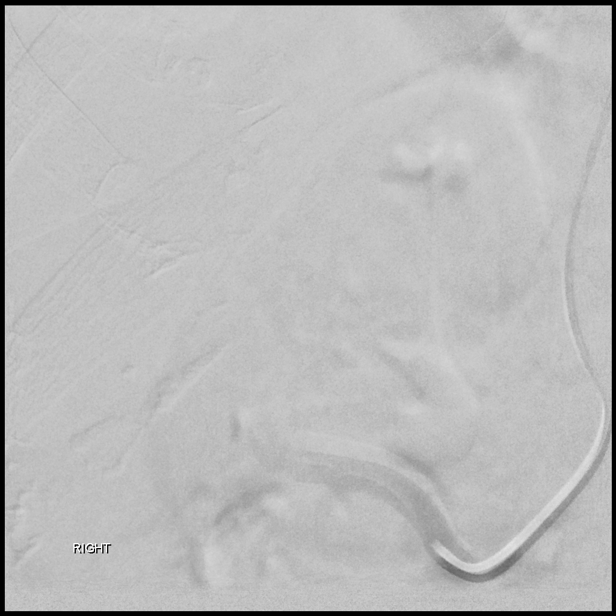

[Series 12: care body 4 · 1 of 21 frames shown (10 of 12)]
[frame 18/21]
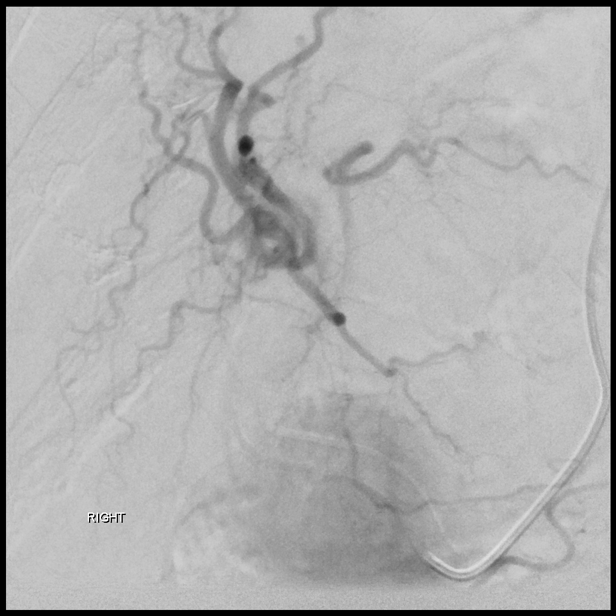

[Series 13: care body 4 · 1 of 26 frames shown (11 of 12)]
[frame 23/26]
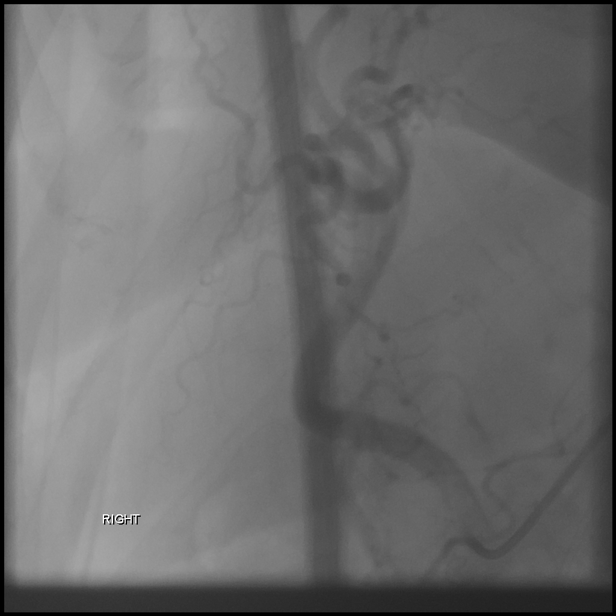

[Series 14: care body 4 · 1 of 36 frames shown (12 of 12)]
[frame 31/36]
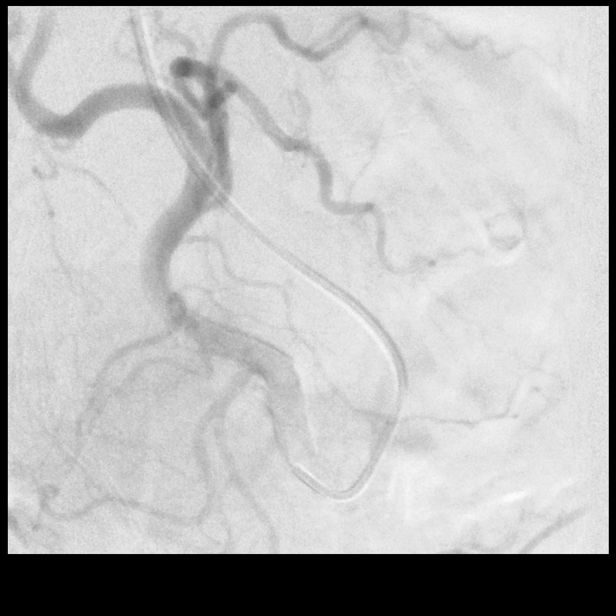

[12 of 24 positions shown; findings below may reference images not displayed]

EXAM:
1. Ultrasound-guided access left radial artery
2. Catheterization of the celiac axis with arteriogram
3. Catheterization of the superior mesenteric artery with
arteriogram
4. Catheterization of the replaced common hepatic artery with
arteriogram
5. Catheterization of the segment [DATE] left hepatic artery with
arteriogram
6. Catheterization of the segment 3 hepatic artery with arteriogram
7. Catheterization of the segment 2 left hepatic artery with
arteriogram

MEDICATIONS:
None

ANESTHESIA/SEDATION:
Moderate (conscious) sedation was employed during this procedure. A
total of Versed 2 mg and Fentanyl 100 mcg was administered
intravenously.

Moderate Sedation Time: 70 minutes. The patient's level of
consciousness and vital signs were monitored continuously by
radiology nursing throughout the procedure under my direct
supervision.

CONTRAST:  100 mL Isovue 300

FLUOROSCOPY TIME:  Fluoroscopy Time: 17 minutes 30 seconds (1,800
mGy).

COMPLICATIONS:
None immediate.



The left radial artery was interrogated with ultrasound and found to
be widely patent and of adequate diameter. An image was obtained and
stored for the medical record. Local anesthesia was attained by
infiltration with 1% lidocaine. Under real-time sonographic
guidance, the vessel was punctured with a 21 gauge micropuncture
needle. Using standard technique, the initial micro needle was
exchanged over a 0.021 micro wire for a hydrophilic 5 French slim
arterial sheath. The sheath was flushed and then a radial cocktail
consisting of [0O] units heparin, 2.5 mg Verapamil and 200 mcg
nitroglycerine was injected through the sheath.

An ultimate 2 catheter was advanced over a Bentson wire into the
abdominal aorta. The catheter was used to select the celiac artery.
An arteriogram was performed. There is variant hepatic arterial
anatomy. The celiac artery gives rise to the splenic artery, left
gastric artery and dorsal pancreatic artery. No evidence of hepatic
artery.

The catheter was next used to select the origin of the superior
mesenteric artery. Arteriography was performed. The common hepatic
arteries completely replaced to the SMA. A glidewire was
successfully advanced into the common hepatic artery and the
ultimate 1 catheter advanced over the wire into the artery. Common
hepatic arteriography was then performed in multiple obliquities.
Prominent supra duodenal artery arising just distal to the
gastroduodenal artery. The right gastric artery is visualized coming
off the proper hepatic artery.

A renegade STC microcatheter was advanced over a Fathom 16 wire and
used to select the segment [DATE] left hepatic artery. Arteriography
was performed. Two hypervascular tumors are present within the
segment 3 distribution. The microcatheter was successfully navigated
beyond the bifurcation into the segment 4 and 3 hepatic arteries and
into the segment 3 hepatic artery. Additional arteriography was
performed. Excellent tumor blush identified. No reflux. No stasis.
No evidence of extrahepatic arterial supply.

The microcatheter was then brought back into the proper hepatic
artery. After several attempts, the segment 2 hepatic artery was
successfully catheterized. Arteriography was performed demonstrating
hypervascular tumor blush in 2 lesions. No evidence of reflux,
stasis or extrahepatic arterial supply.

The microcatheter was brought back into the proper hepatic artery.
Extensive effort was then made to catheterize the right gastric
artery, however this was ultimately unsuccessful. It was felt
acceptable to leave the right gastric artery open as treatment would
be fairly distal in the segment 2 and 3 hepatic arteries and the
chance of reflux into the right gastric artery is near 0.

Therefore, technetium labeled macro aggregated albumin was
successfully injected into the left hepatic artery. The catheters
were removed. Hemostasis was attained with the assistance of a TR
band
IMPRESSION: 1. Variant anatomy with a completely replaced common hepatic artery
to the SMA.
2. Variant anatomy with two left-sided hepatic arteries, one
supplying segments 3 and 4 and one supplying segment 2.
3. Successful multivessel angiogram. There are 4 hypervascular
lesions in the lateral segment of the left hemi liver, two in
segment 2 and two in segment 3.

PLAN:
Return in 2 weeks for split dose segmentectomy targeting segments 2
and 3.

## 2019-09-21 IMAGING — XA IR ANGIO/VISCERAL SELECTIVE EA VESSEL WO/W FLUSH
12 of 15 series · 12 of 24 positions shown · IV contrast (IODINE)
Comparison: none

INDICATION: 63-year-old female with multifocal hepatocellular carcinoma isolated
to the lateral segment of the left hepatic lobe. She presents for
pre [AGE] arterial mapping.

[Series 1: care body 4 · 1 of 21 frames shown (1 of 12)]
[frame 11/21]
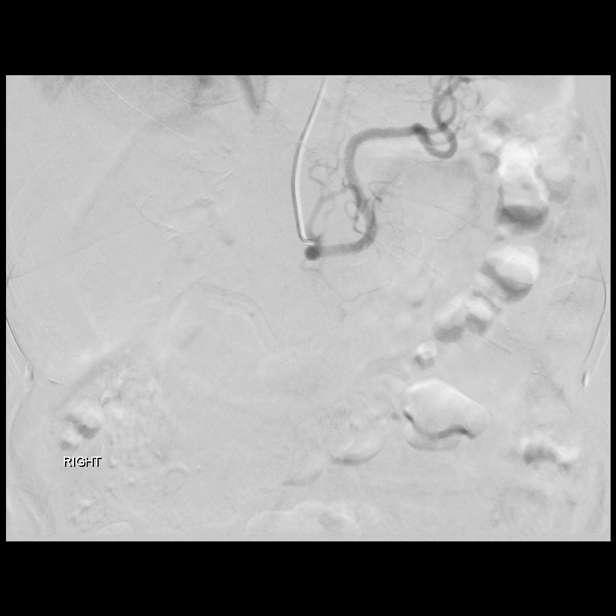

[Series 2: care body 4 · 1 of 21 frames shown (2 of 12)]
[frame 18/21]
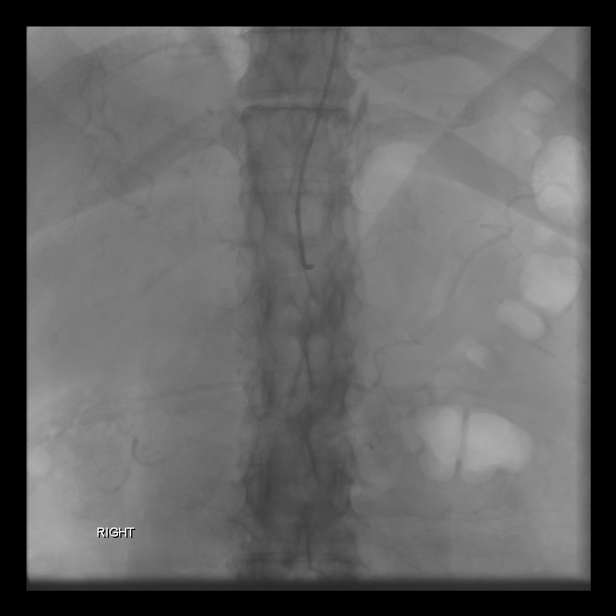

[Series 4: care body 4 · 1 of 68 frames shown (3 of 12)]
[frame 11/68]
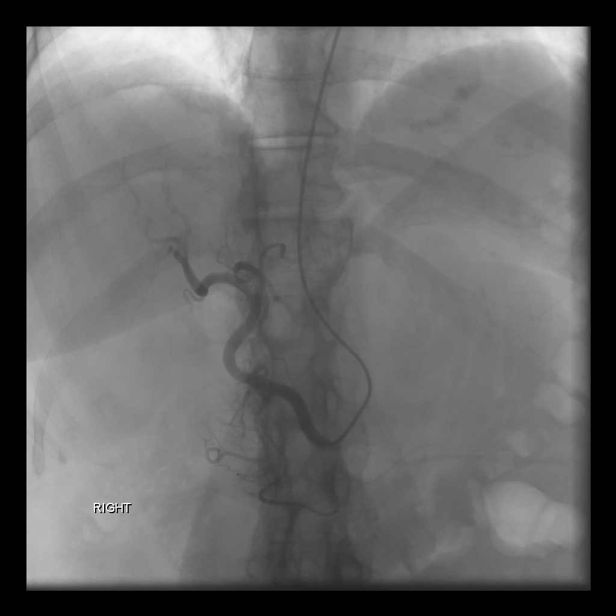

[Series 5: care body 4 · 1 of 47 frames shown (4 of 12)]
[frame 8/47]
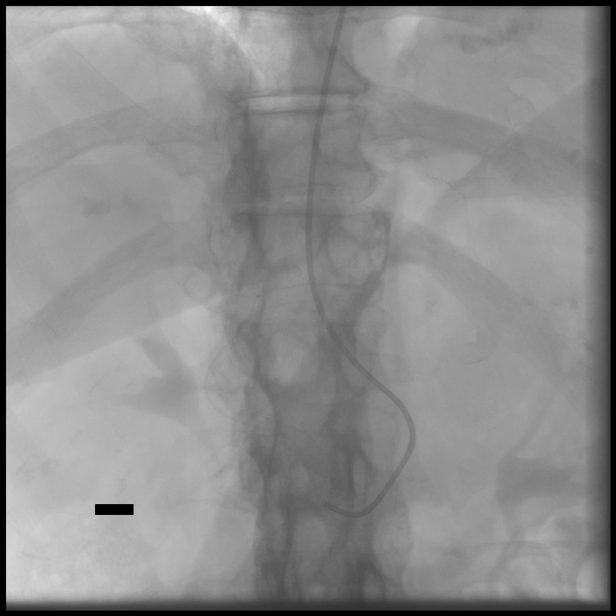

[Series 6: care body 4 · 1 of 45 frames shown (5 of 12)]
[frame 23/45]
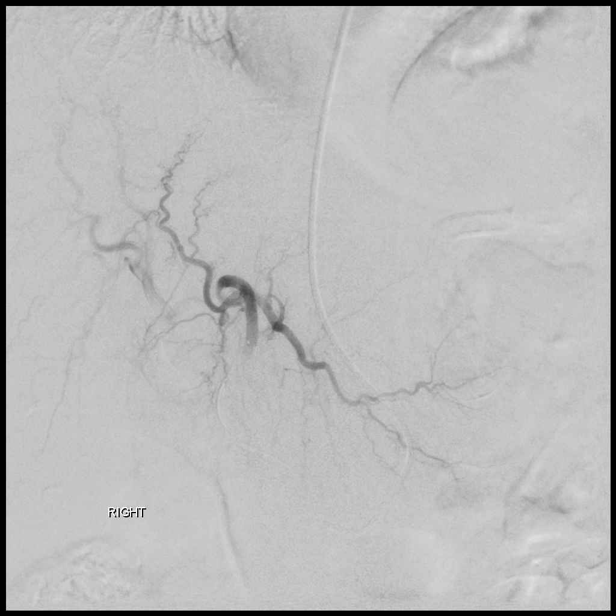

[Series 7: care body 4 · 1 of 37 frames shown (6 of 12)]
[frame 32/37]
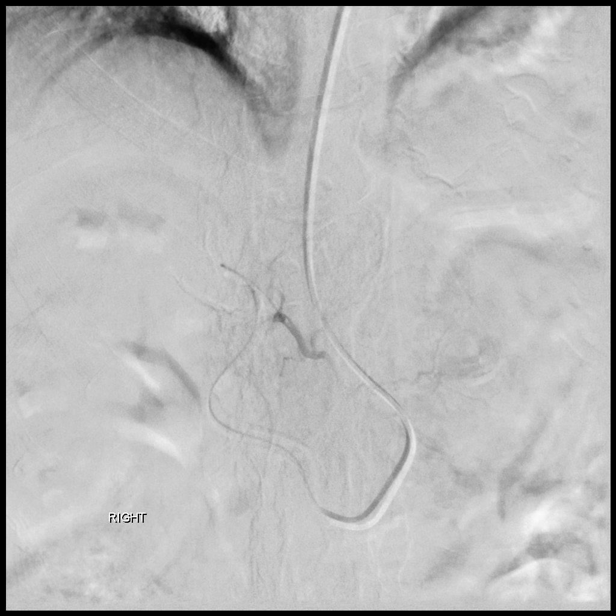

[Series 9: care body 4 · 1 of 28 frames shown (7 of 12)]
[frame 5/28]
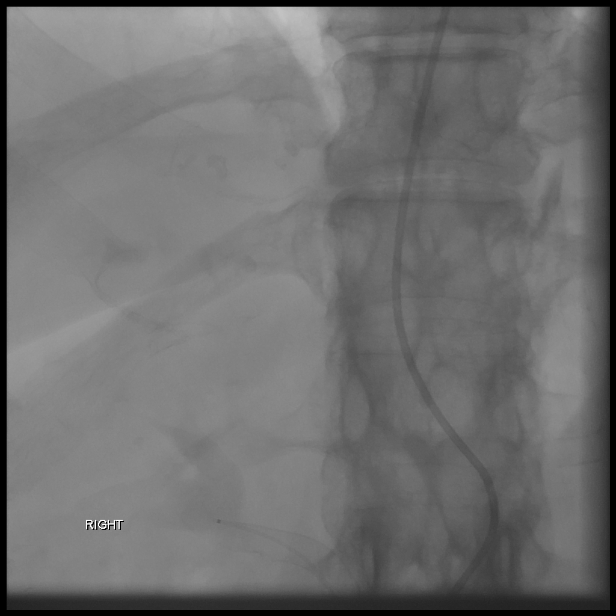

[Series 10: care body 4 · 1 of 21 frames shown (8 of 12)]
[frame 4/21]
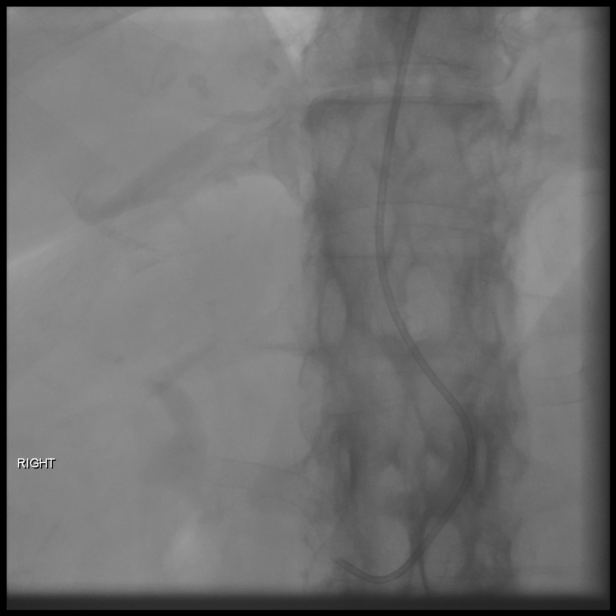

[Series 11: care body 4 · 1 of 22 frames shown (9 of 12)]
[frame 12/22]
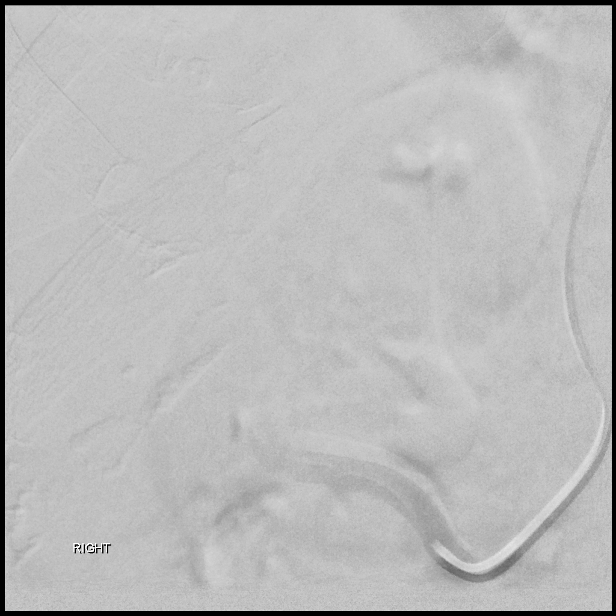

[Series 12: care body 4 · 1 of 21 frames shown (10 of 12)]
[frame 18/21]
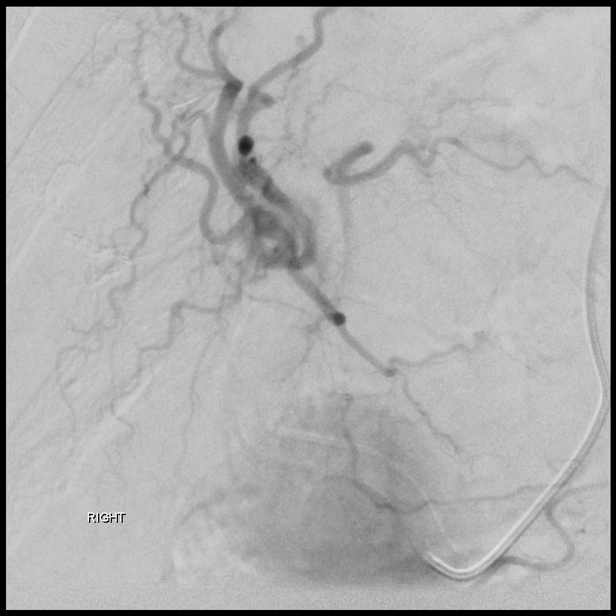

[Series 13: care body 4 · 1 of 26 frames shown (11 of 12)]
[frame 23/26]
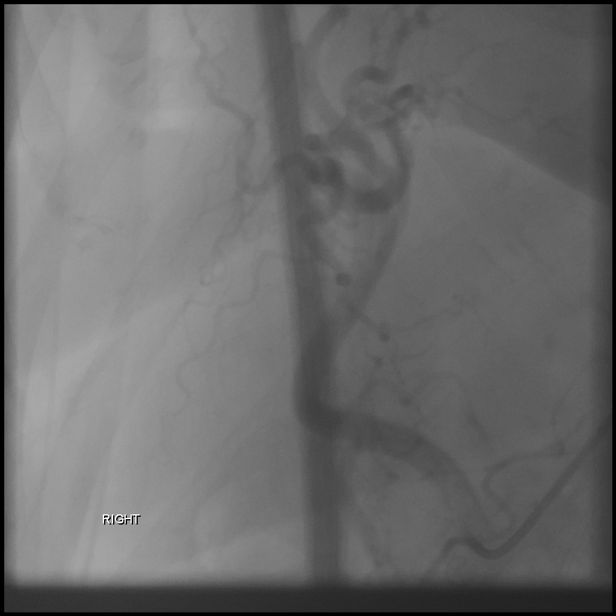

[Series 14: care body 4 · 1 of 36 frames shown (12 of 12)]
[frame 31/36]
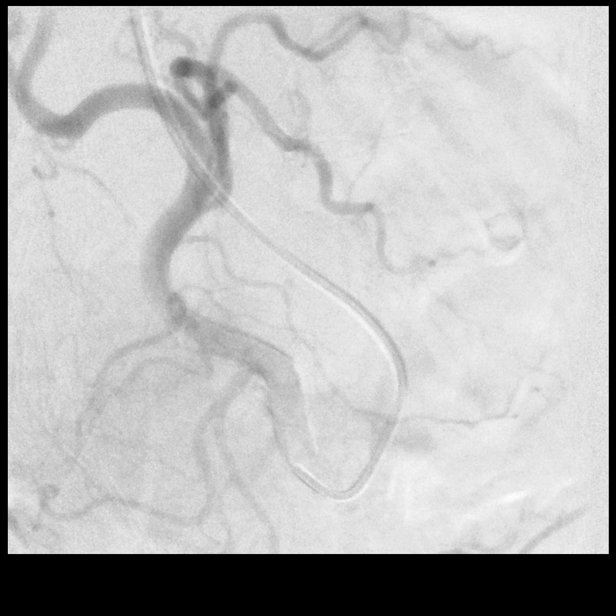

[12 of 24 positions shown; findings below may reference images not displayed]

EXAM:
1. Ultrasound-guided access left radial artery
2. Catheterization of the celiac axis with arteriogram
3. Catheterization of the superior mesenteric artery with
arteriogram
4. Catheterization of the replaced common hepatic artery with
arteriogram
5. Catheterization of the segment [DATE] left hepatic artery with
arteriogram
6. Catheterization of the segment 3 hepatic artery with arteriogram
7. Catheterization of the segment 2 left hepatic artery with
arteriogram

MEDICATIONS:
None

ANESTHESIA/SEDATION:
Moderate (conscious) sedation was employed during this procedure. A
total of Versed 2 mg and Fentanyl 100 mcg was administered
intravenously.

Moderate Sedation Time: 70 minutes. The patient's level of
consciousness and vital signs were monitored continuously by
radiology nursing throughout the procedure under my direct
supervision.

CONTRAST:  100 mL Isovue 300

FLUOROSCOPY TIME:  Fluoroscopy Time: 17 minutes 30 seconds (1,800
mGy).

COMPLICATIONS:
None immediate.



The left radial artery was interrogated with ultrasound and found to
be widely patent and of adequate diameter. An image was obtained and
stored for the medical record. Local anesthesia was attained by
infiltration with 1% lidocaine. Under real-time sonographic
guidance, the vessel was punctured with a 21 gauge micropuncture
needle. Using standard technique, the initial micro needle was
exchanged over a 0.021 micro wire for a hydrophilic 5 French slim
arterial sheath. The sheath was flushed and then a radial cocktail
consisting of [0O] units heparin, 2.5 mg Verapamil and 200 mcg
nitroglycerine was injected through the sheath.

An ultimate 2 catheter was advanced over a Bentson wire into the
abdominal aorta. The catheter was used to select the celiac artery.
An arteriogram was performed. There is variant hepatic arterial
anatomy. The celiac artery gives rise to the splenic artery, left
gastric artery and dorsal pancreatic artery. No evidence of hepatic
artery.

The catheter was next used to select the origin of the superior
mesenteric artery. Arteriography was performed. The common hepatic
arteries completely replaced to the SMA. A glidewire was
successfully advanced into the common hepatic artery and the
ultimate 1 catheter advanced over the wire into the artery. Common
hepatic arteriography was then performed in multiple obliquities.
Prominent supra duodenal artery arising just distal to the
gastroduodenal artery. The right gastric artery is visualized coming
off the proper hepatic artery.

A renegade STC microcatheter was advanced over a Fathom 16 wire and
used to select the segment [DATE] left hepatic artery. Arteriography
was performed. Two hypervascular tumors are present within the
segment 3 distribution. The microcatheter was successfully navigated
beyond the bifurcation into the segment 4 and 3 hepatic arteries and
into the segment 3 hepatic artery. Additional arteriography was
performed. Excellent tumor blush identified. No reflux. No stasis.
No evidence of extrahepatic arterial supply.

The microcatheter was then brought back into the proper hepatic
artery. After several attempts, the segment 2 hepatic artery was
successfully catheterized. Arteriography was performed demonstrating
hypervascular tumor blush in 2 lesions. No evidence of reflux,
stasis or extrahepatic arterial supply.

The microcatheter was brought back into the proper hepatic artery.
Extensive effort was then made to catheterize the right gastric
artery, however this was ultimately unsuccessful. It was felt
acceptable to leave the right gastric artery open as treatment would
be fairly distal in the segment 2 and 3 hepatic arteries and the
chance of reflux into the right gastric artery is near 0.

Therefore, technetium labeled macro aggregated albumin was
successfully injected into the left hepatic artery. The catheters
were removed. Hemostasis was attained with the assistance of a TR
band
IMPRESSION: 1. Variant anatomy with a completely replaced common hepatic artery
to the SMA.
2. Variant anatomy with two left-sided hepatic arteries, one
supplying segments 3 and 4 and one supplying segment 2.
3. Successful multivessel angiogram. There are 4 hypervascular
lesions in the lateral segment of the left hemi liver, two in
segment 2 and two in segment 3.

PLAN:
Return in 2 weeks for split dose segmentectomy targeting segments 2
and 3.

## 2019-09-21 IMAGING — XA IR ANGIO/ADD [PERSON_NAME]
12 of 15 series · 12 of 24 positions shown · IV contrast (IODINE)
Comparison: none

INDICATION: 63-year-old female with multifocal hepatocellular carcinoma isolated
to the lateral segment of the left hepatic lobe. She presents for
pre [AGE] arterial mapping.

[Series 1: care body 4 · 1 of 21 frames shown (1 of 12)]
[frame 11/21]
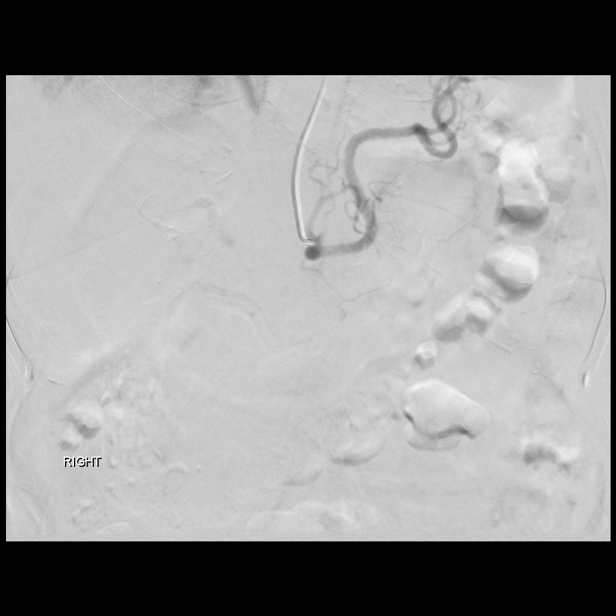

[Series 2: care body 4 · 1 of 21 frames shown (2 of 12)]
[frame 18/21]
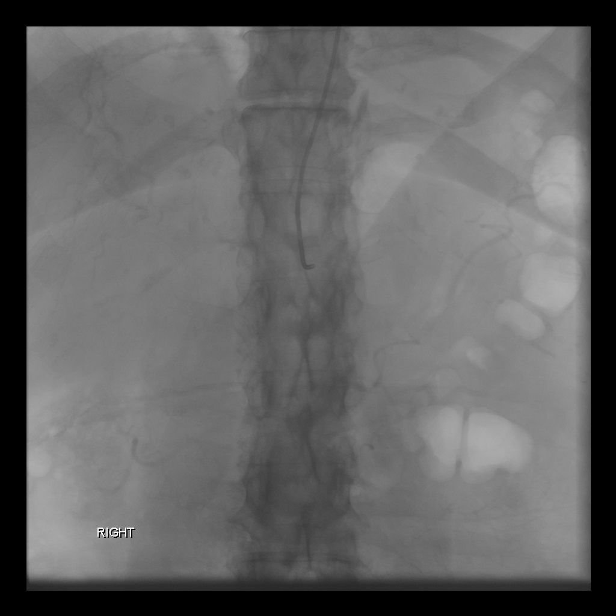

[Series 4: care body 4 · 1 of 68 frames shown (3 of 12)]
[frame 11/68]
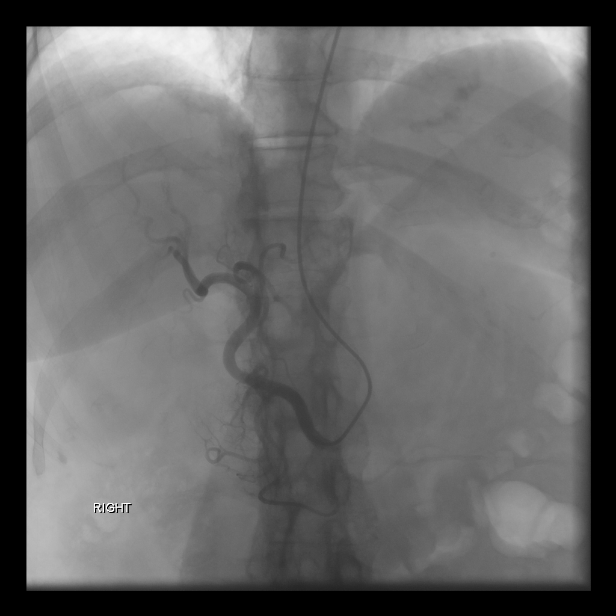

[Series 5: care body 4 · 1 of 47 frames shown (4 of 12)]
[frame 8/47]
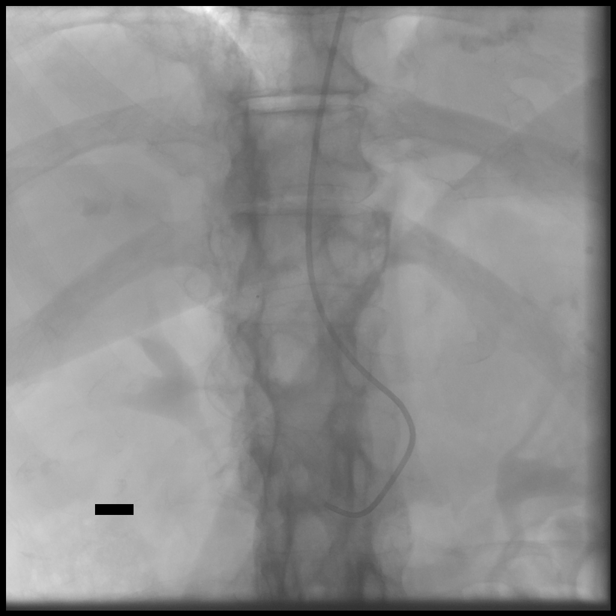

[Series 6: care body 4 · 1 of 45 frames shown (5 of 12)]
[frame 23/45]
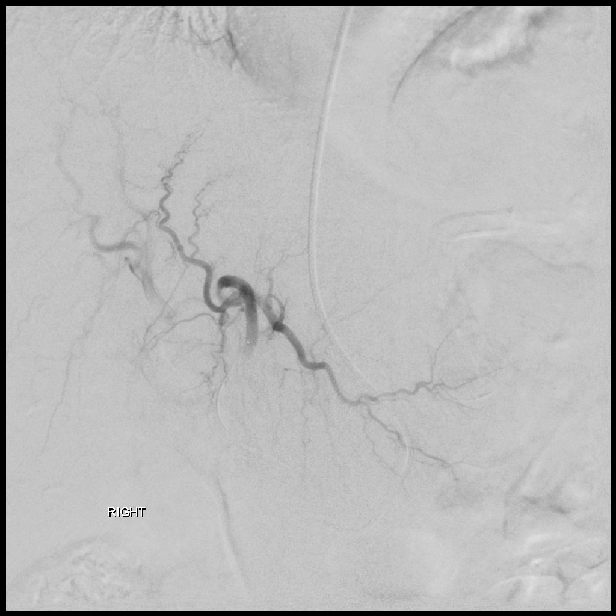

[Series 7: care body 4 · 1 of 37 frames shown (6 of 12)]
[frame 32/37]
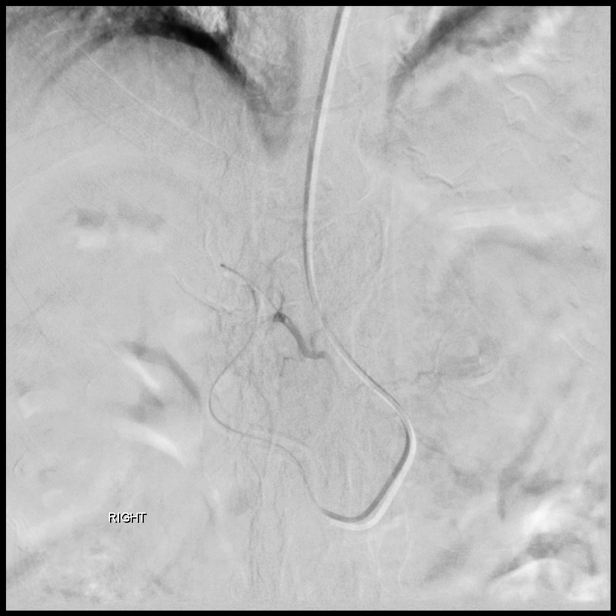

[Series 9: care body 4 · 1 of 28 frames shown (7 of 12)]
[frame 5/28]
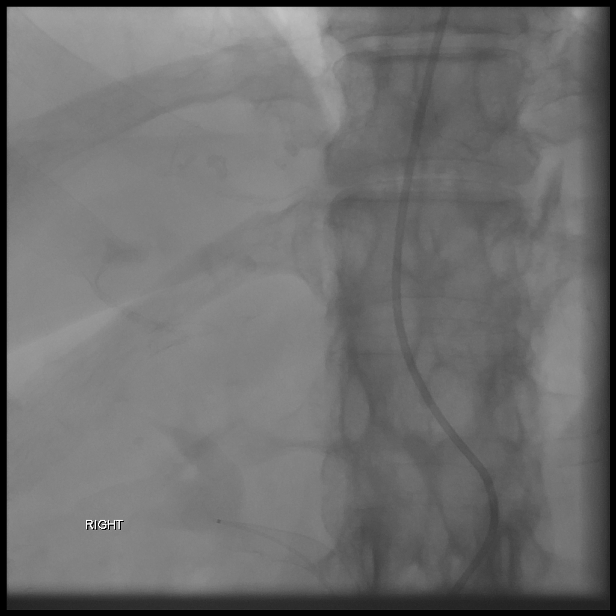

[Series 10: care body 4 · 1 of 21 frames shown (8 of 12)]
[frame 4/21]
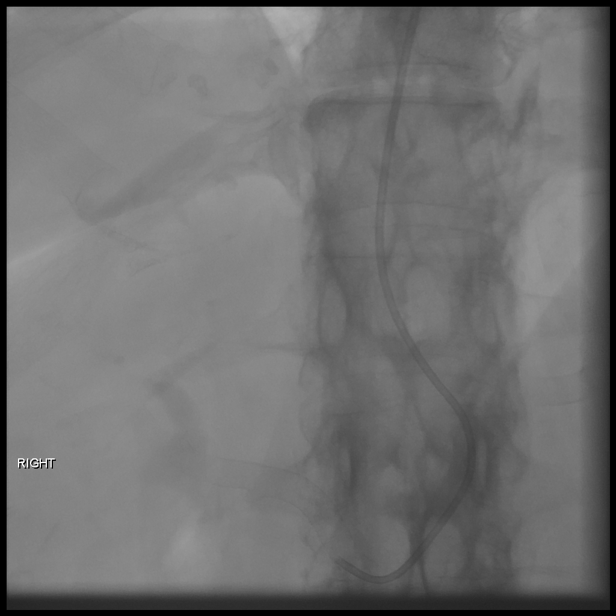

[Series 11: care body 4 · 1 of 22 frames shown (9 of 12)]
[frame 12/22]
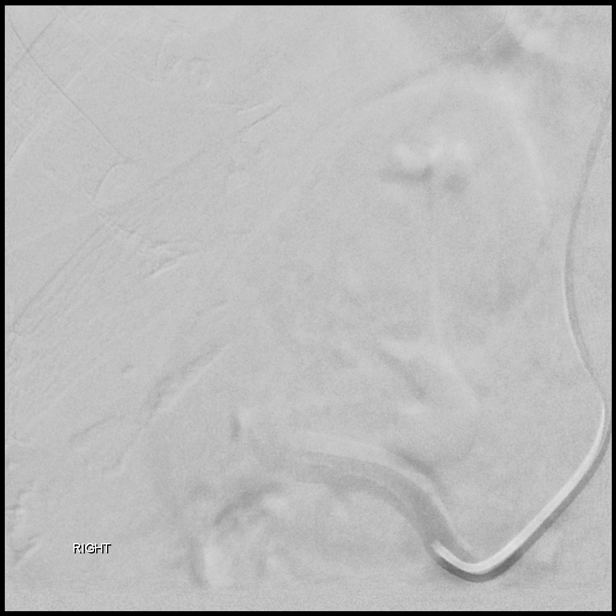

[Series 12: care body 4 · 1 of 21 frames shown (10 of 12)]
[frame 18/21]
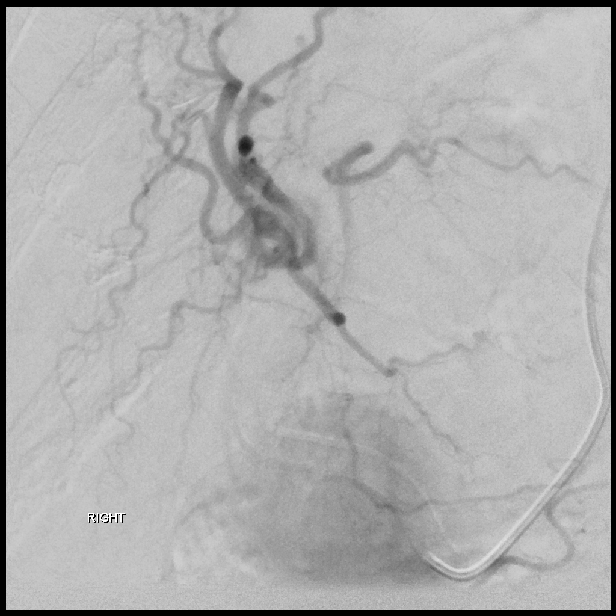

[Series 13: care body 4 · 1 of 26 frames shown (11 of 12)]
[frame 23/26]
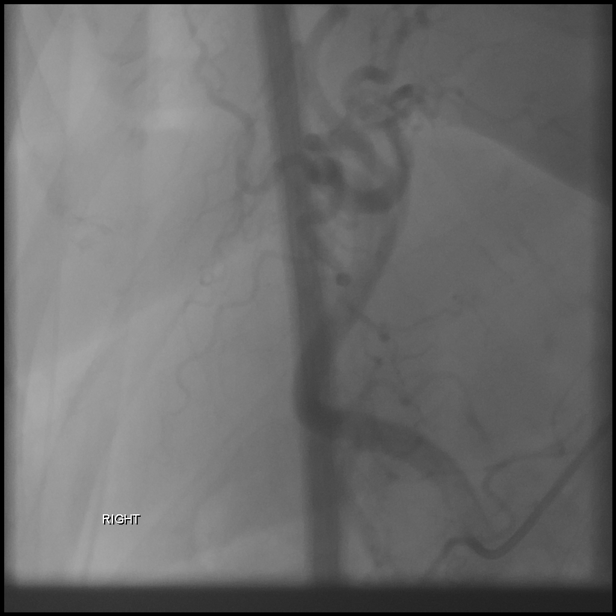

[Series 14: care body 4 · 1 of 36 frames shown (12 of 12)]
[frame 31/36]
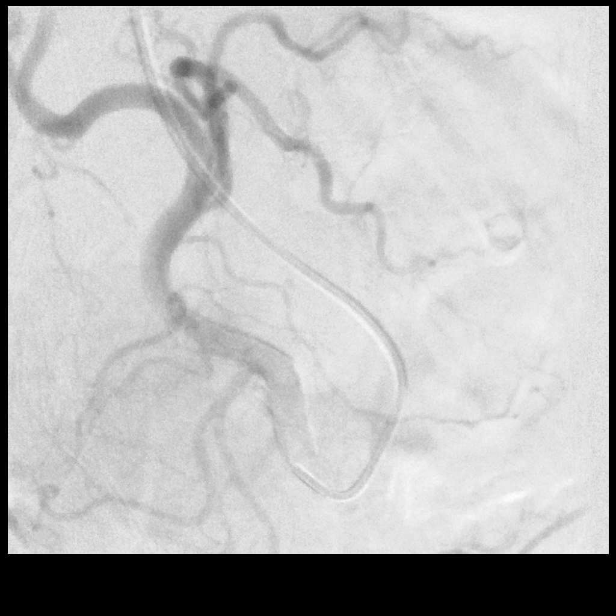

[12 of 24 positions shown; findings below may reference images not displayed]

EXAM:
1. Ultrasound-guided access left radial artery
2. Catheterization of the celiac axis with arteriogram
3. Catheterization of the superior mesenteric artery with
arteriogram
4. Catheterization of the replaced common hepatic artery with
arteriogram
5. Catheterization of the segment [DATE] left hepatic artery with
arteriogram
6. Catheterization of the segment 3 hepatic artery with arteriogram
7. Catheterization of the segment 2 left hepatic artery with
arteriogram

MEDICATIONS:
None

ANESTHESIA/SEDATION:
Moderate (conscious) sedation was employed during this procedure. A
total of Versed 2 mg and Fentanyl 100 mcg was administered
intravenously.

Moderate Sedation Time: 70 minutes. The patient's level of
consciousness and vital signs were monitored continuously by
radiology nursing throughout the procedure under my direct
supervision.

CONTRAST:  100 mL Isovue 300

FLUOROSCOPY TIME:  Fluoroscopy Time: 17 minutes 30 seconds (1,800
mGy).

COMPLICATIONS:
None immediate.



The left radial artery was interrogated with ultrasound and found to
be widely patent and of adequate diameter. An image was obtained and
stored for the medical record. Local anesthesia was attained by
infiltration with 1% lidocaine. Under real-time sonographic
guidance, the vessel was punctured with a 21 gauge micropuncture
needle. Using standard technique, the initial micro needle was
exchanged over a 0.021 micro wire for a hydrophilic 5 French slim
arterial sheath. The sheath was flushed and then a radial cocktail
consisting of [0O] units heparin, 2.5 mg Verapamil and 200 mcg
nitroglycerine was injected through the sheath.

An ultimate 2 catheter was advanced over a Bentson wire into the
abdominal aorta. The catheter was used to select the celiac artery.
An arteriogram was performed. There is variant hepatic arterial
anatomy. The celiac artery gives rise to the splenic artery, left
gastric artery and dorsal pancreatic artery. No evidence of hepatic
artery.

The catheter was next used to select the origin of the superior
mesenteric artery. Arteriography was performed. The common hepatic
arteries completely replaced to the SMA. A glidewire was
successfully advanced into the common hepatic artery and the
ultimate 1 catheter advanced over the wire into the artery. Common
hepatic arteriography was then performed in multiple obliquities.
Prominent supra duodenal artery arising just distal to the
gastroduodenal artery. The right gastric artery is visualized coming
off the proper hepatic artery.

A renegade STC microcatheter was advanced over a Fathom 16 wire and
used to select the segment [DATE] left hepatic artery. Arteriography
was performed. Two hypervascular tumors are present within the
segment 3 distribution. The microcatheter was successfully navigated
beyond the bifurcation into the segment 4 and 3 hepatic arteries and
into the segment 3 hepatic artery. Additional arteriography was
performed. Excellent tumor blush identified. No reflux. No stasis.
No evidence of extrahepatic arterial supply.

The microcatheter was then brought back into the proper hepatic
artery. After several attempts, the segment 2 hepatic artery was
successfully catheterized. Arteriography was performed demonstrating
hypervascular tumor blush in 2 lesions. No evidence of reflux,
stasis or extrahepatic arterial supply.

The microcatheter was brought back into the proper hepatic artery.
Extensive effort was then made to catheterize the right gastric
artery, however this was ultimately unsuccessful. It was felt
acceptable to leave the right gastric artery open as treatment would
be fairly distal in the segment 2 and 3 hepatic arteries and the
chance of reflux into the right gastric artery is near 0.

Therefore, technetium labeled macro aggregated albumin was
successfully injected into the left hepatic artery. The catheters
were removed. Hemostasis was attained with the assistance of a TR
band
IMPRESSION: 1. Variant anatomy with a completely replaced common hepatic artery
to the SMA.
2. Variant anatomy with two left-sided hepatic arteries, one
supplying segments 3 and 4 and one supplying segment 2.
3. Successful multivessel angiogram. There are 4 hypervascular
lesions in the lateral segment of the left hemi liver, two in
segment 2 and two in segment 3.

PLAN:
Return in 2 weeks for split dose segmentectomy targeting segments 2
and 3.

## 2019-09-21 IMAGING — US IR ANGIO/VISCERAL SELECTIVE EA VESSEL WO/W FLUSH
12 of 23 series · 12 of 24 positions shown · non-contrast
Comparison: none

INDICATION: 63-year-old female with multifocal hepatocellular carcinoma isolated
to the lateral segment of the left hepatic lobe. She presents for
pre [AGE] arterial mapping.

[Series 1: (id) · 1 of 1 slices shown]
[im 1/1]
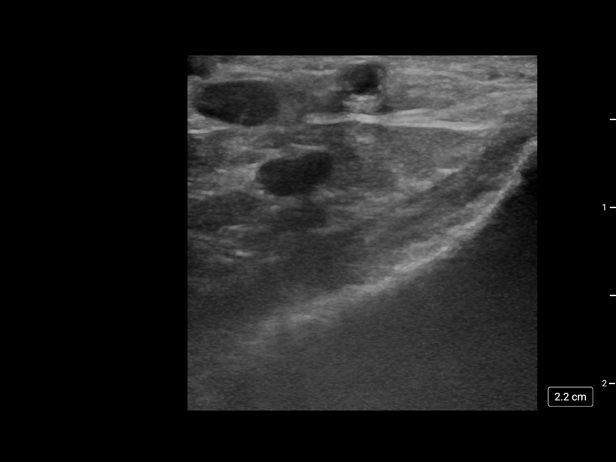

[Series 2: care body 4 · 1 of 21 frames shown (1 of 11)]
[frame 11/21]
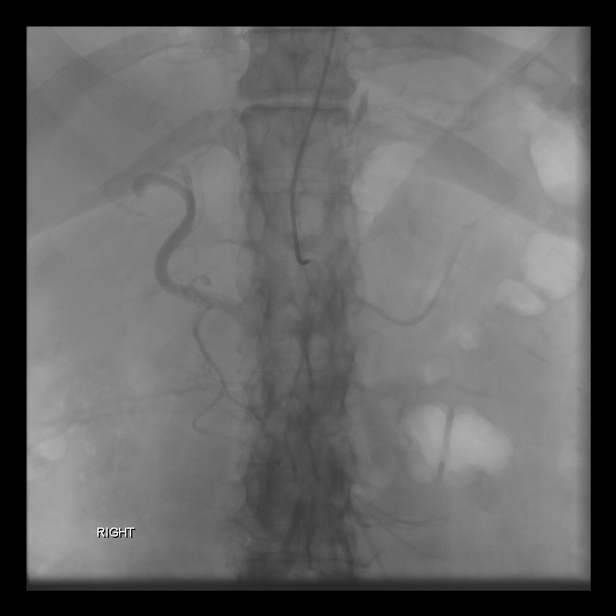

[Series 3: care body 4 · 1 of 46 frames shown (2 of 11)]
[frame 40/46]
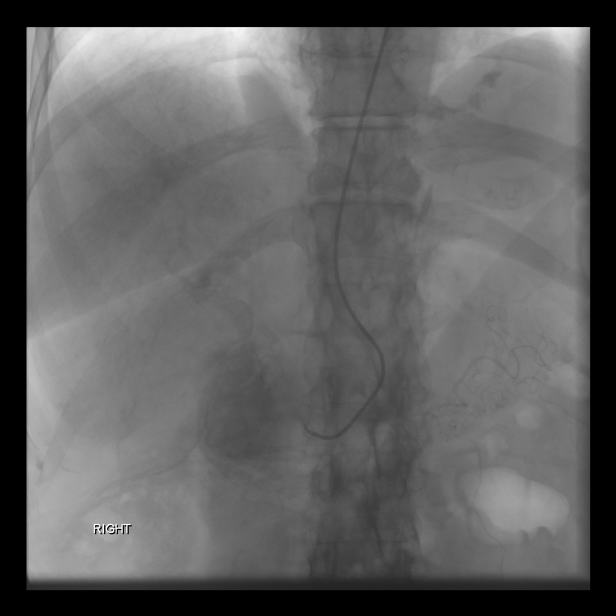

[Series 4: care body 4 · 1 of 68 frames shown (3 of 11)]
[frame 58/68]
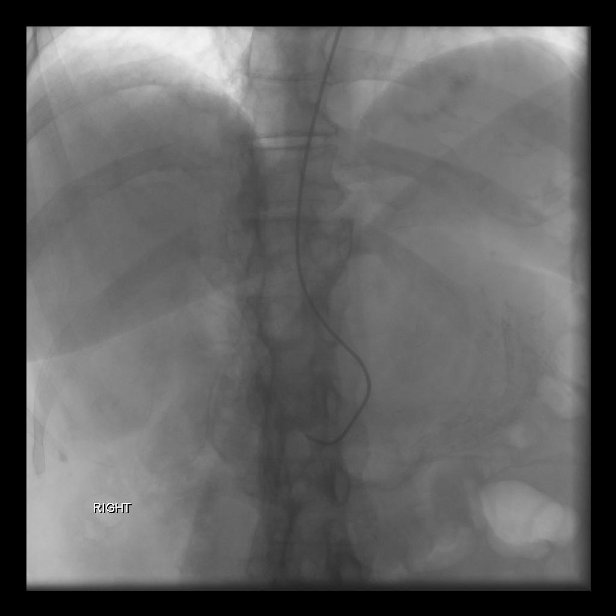

[Series 5: care body 4 · 1 of 47 frames shown (4 of 11)]
[frame 24/47]
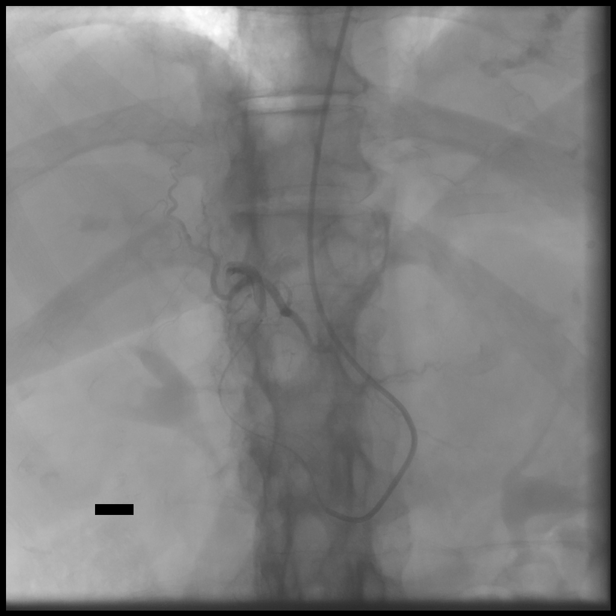

[Series 6: care body 4 · 1 of 45 frames shown (5 of 11)]
[frame 23/45]
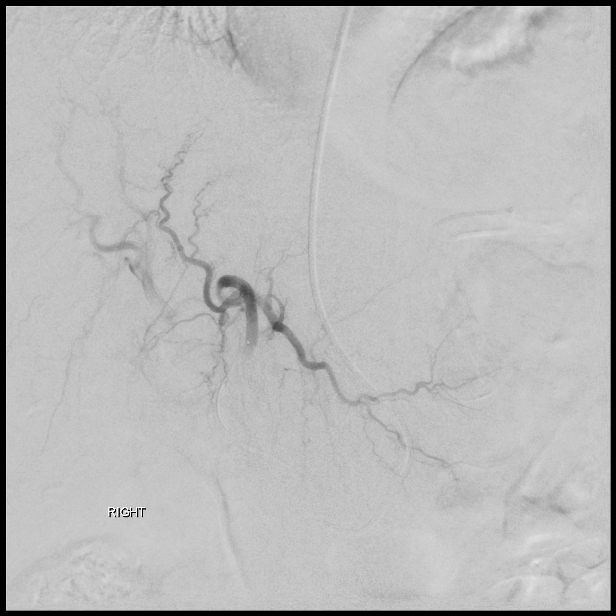

[Series 7: care body 4 · 1 of 37 frames shown (6 of 11)]
[frame 6/37]
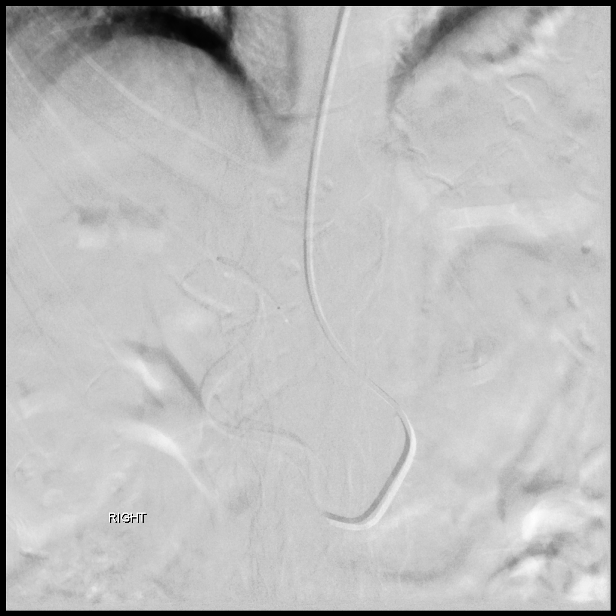

[Series 8: care body 4 · 1 of 46 frames shown (7 of 11)]
[frame 24/46]
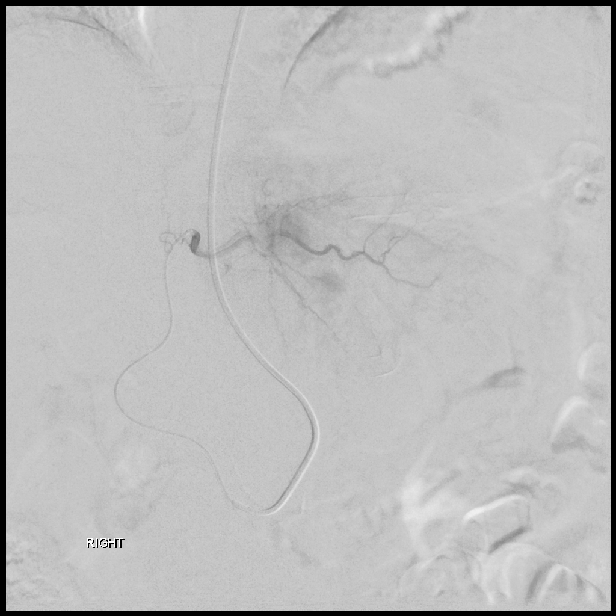

[Series 9: care body 4 · 1 of 28 frames shown (8 of 11)]
[frame 15/28]
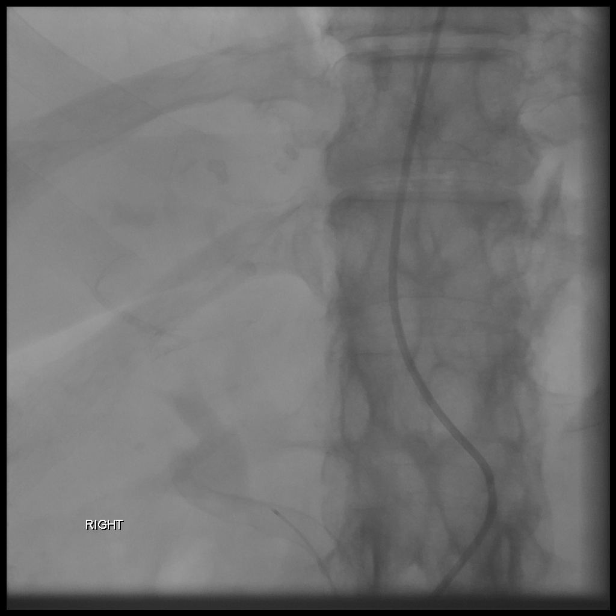

[Series 10: care body 4 · 1 of 21 frames shown (9 of 11)]
[frame 4/21]
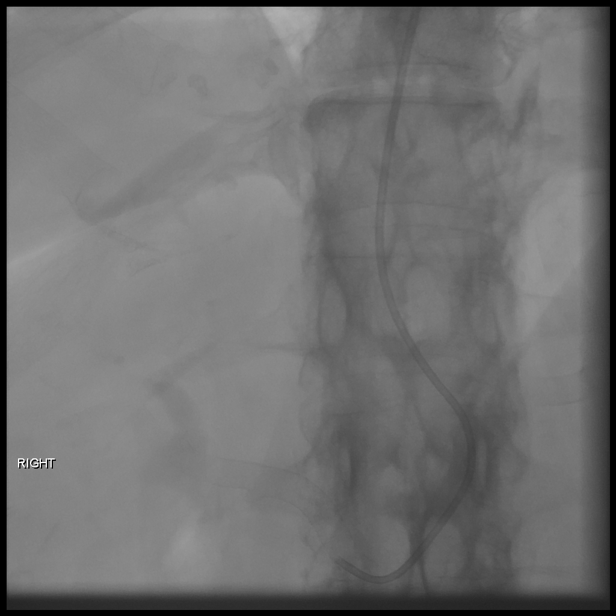

[Series 11: care body 4 · 1 of 22 frames shown (10 of 11)]
[frame 4/22]
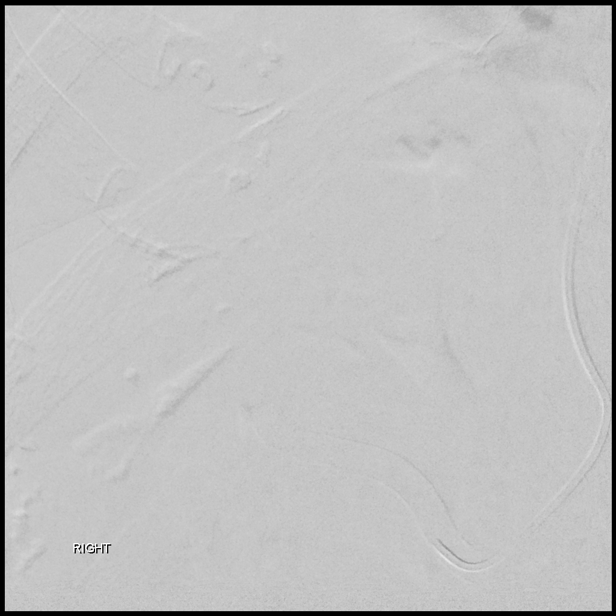

[Series 11: care body 4 · 1 of 22 frames shown (11 of 11)]
[frame 22/22]
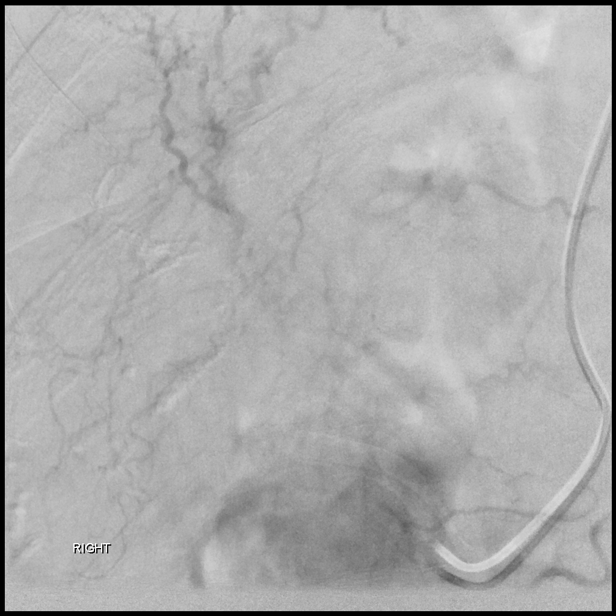

[12 of 24 positions shown; findings below may reference images not displayed]

EXAM:
1. Ultrasound-guided access left radial artery
2. Catheterization of the celiac axis with arteriogram
3. Catheterization of the superior mesenteric artery with
arteriogram
4. Catheterization of the replaced common hepatic artery with
arteriogram
5. Catheterization of the segment [DATE] left hepatic artery with
arteriogram
6. Catheterization of the segment 3 hepatic artery with arteriogram
7. Catheterization of the segment 2 left hepatic artery with
arteriogram

MEDICATIONS:
None

ANESTHESIA/SEDATION:
Moderate (conscious) sedation was employed during this procedure. A
total of Versed 2 mg and Fentanyl 100 mcg was administered
intravenously.

Moderate Sedation Time: 70 minutes. The patient's level of
consciousness and vital signs were monitored continuously by
radiology nursing throughout the procedure under my direct
supervision.

CONTRAST:  100 mL Isovue 300

FLUOROSCOPY TIME:  Fluoroscopy Time: 17 minutes 30 seconds (1,800
mGy).

COMPLICATIONS:
None immediate.



The left radial artery was interrogated with ultrasound and found to
be widely patent and of adequate diameter. An image was obtained and
stored for the medical record. Local anesthesia was attained by
infiltration with 1% lidocaine. Under real-time sonographic
guidance, the vessel was punctured with a 21 gauge micropuncture
needle. Using standard technique, the initial micro needle was
exchanged over a 0.021 micro wire for a hydrophilic 5 French slim
arterial sheath. The sheath was flushed and then a radial cocktail
consisting of [0O] units heparin, 2.5 mg Verapamil and 200 mcg
nitroglycerine was injected through the sheath.

An ultimate 2 catheter was advanced over a Bentson wire into the
abdominal aorta. The catheter was used to select the celiac artery.
An arteriogram was performed. There is variant hepatic arterial
anatomy. The celiac artery gives rise to the splenic artery, left
gastric artery and dorsal pancreatic artery. No evidence of hepatic
artery.

The catheter was next used to select the origin of the superior
mesenteric artery. Arteriography was performed. The common hepatic
arteries completely replaced to the SMA. A glidewire was
successfully advanced into the common hepatic artery and the
ultimate 1 catheter advanced over the wire into the artery. Common
hepatic arteriography was then performed in multiple obliquities.
Prominent supra duodenal artery arising just distal to the
gastroduodenal artery. The right gastric artery is visualized coming
off the proper hepatic artery.

A renegade STC microcatheter was advanced over a Fathom 16 wire and
used to select the segment [DATE] left hepatic artery. Arteriography
was performed. Two hypervascular tumors are present within the
segment 3 distribution. The microcatheter was successfully navigated
beyond the bifurcation into the segment 4 and 3 hepatic arteries and
into the segment 3 hepatic artery. Additional arteriography was
performed. Excellent tumor blush identified. No reflux. No stasis.
No evidence of extrahepatic arterial supply.

The microcatheter was then brought back into the proper hepatic
artery. After several attempts, the segment 2 hepatic artery was
successfully catheterized. Arteriography was performed demonstrating
hypervascular tumor blush in 2 lesions. No evidence of reflux,
stasis or extrahepatic arterial supply.

The microcatheter was brought back into the proper hepatic artery.
Extensive effort was then made to catheterize the right gastric
artery, however this was ultimately unsuccessful. It was felt
acceptable to leave the right gastric artery open as treatment would
be fairly distal in the segment 2 and 3 hepatic arteries and the
chance of reflux into the right gastric artery is near 0.

Therefore, technetium labeled macro aggregated albumin was
successfully injected into the left hepatic artery. The catheters
were removed. Hemostasis was attained with the assistance of a TR
band
IMPRESSION: 1. Variant anatomy with a completely replaced common hepatic artery
to the SMA.
2. Variant anatomy with two left-sided hepatic arteries, one
supplying segments 3 and 4 and one supplying segment 2.
3. Successful multivessel angiogram. There are 4 hypervascular
lesions in the lateral segment of the left hemi liver, two in
segment 2 and two in segment 3.

PLAN:
Return in 2 weeks for split dose segmentectomy targeting segments 2
and 3.

## 2019-09-21 IMAGING — NM NM LIVER IMG SPECT
5 series · 20 of 20 positions shown · non-contrast
Comparison: MRI [DATE], CT [DATE]

CLINICAL DATA: Hepatocellular carcinoma.

EXAM:
NUCLEAR MEDICINE LIVER SCAN
TECHNIQUE: Abdominal images were obtained in multiple projections after
intrahepatic arterial injection of radiopharmaceutical. SPECT
imaging was performed. Lung shunt calculation was performed.
RADIOPHARMACEUTICALS:  5.4 millicuries technetium MAA

[Series 1: spect - (id) _(id)_cor · 4.1mm · 4.14mm/px · 6 of 128 frames shown]
[frame 11/128]
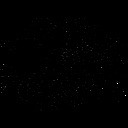
[frame 32/128]
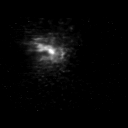
[frame 54/128]
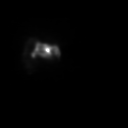
[frame 75/128]
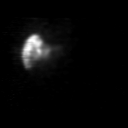
[frame 96/128]
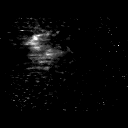
[frame 118/128]
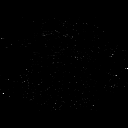

[Series 1: spect - (id) _(id)_tra · 4.1mm · 4.14mm/px · 6 of 128 frames shown]
[frame 11/128]
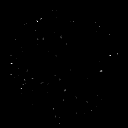
[frame 32/128]
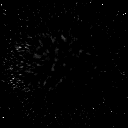
[frame 54/128]
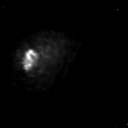
[frame 75/128]
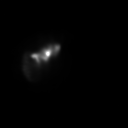
[frame 96/128]
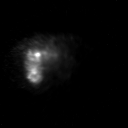
[frame 118/128]
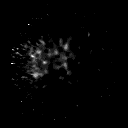

[Series 2: maa spect · 4.14mm/px · 6 of 64 frames shown]
[frame 6/64]
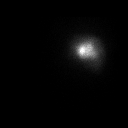
[frame 16/64]
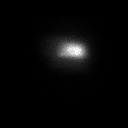
[frame 27/64]
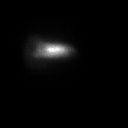
[frame 38/64]
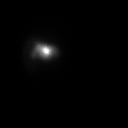
[frame 48/64]
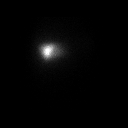
[frame 59/64]
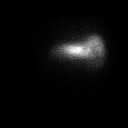

[Series 3: maa statics (2) · 2.07mm/px · 1 of 1 slices shown (1 of 2)]
[im 1/1]
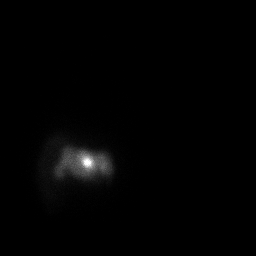

[Series 3: maa statics (2) · 2.07mm/px · 1 of 1 slices shown (2 of 2)]
[im 1/1]
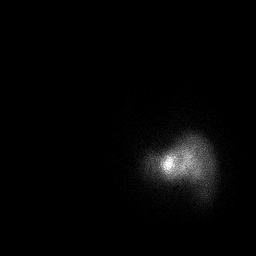

[20 of 20 positions shown; findings below may reference images not displayed]

FINDINGS: The injected microaggregated albumin localizes within the liver.
Majority of radiotracer localizes to the LEFT hepatic lobe. No
evidence of activity within the stomach, duodenum, or bowel.

Calculated shunt fraction to the lungs equals 7.0%.
IMPRESSION: 1. No significant extrahepatic radiotracer activity following
intrahepatic arterial injection of MAA.
2. Majority of radiotracer localizes to the LEFT hepatic lobe.
3. Lung shunt fraction equals 7.0%

## 2019-09-21 IMAGING — XA IR ANGIO/ADD [PERSON_NAME]
12 of 15 series · 12 of 24 positions shown · IV contrast (IODINE)
Comparison: none

INDICATION: 63-year-old female with multifocal hepatocellular carcinoma isolated
to the lateral segment of the left hepatic lobe. She presents for
pre [AGE] arterial mapping.

[Series 1: care body 4 · 1 of 21 frames shown (1 of 12)]
[frame 11/21]
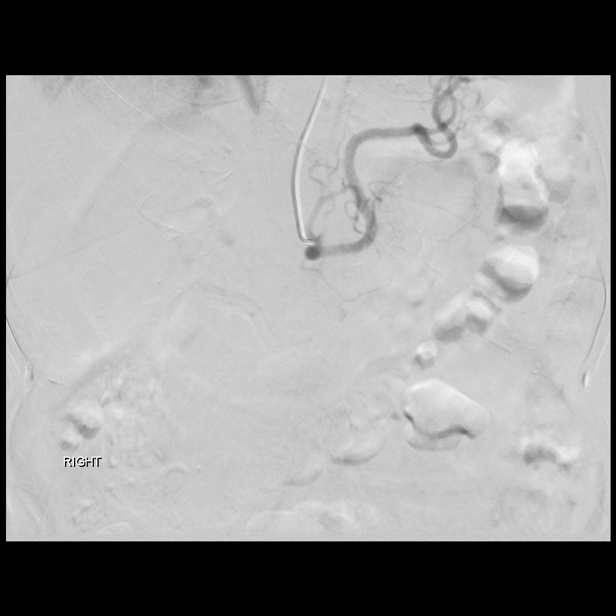

[Series 2: care body 4 · 1 of 21 frames shown (2 of 12)]
[frame 18/21]
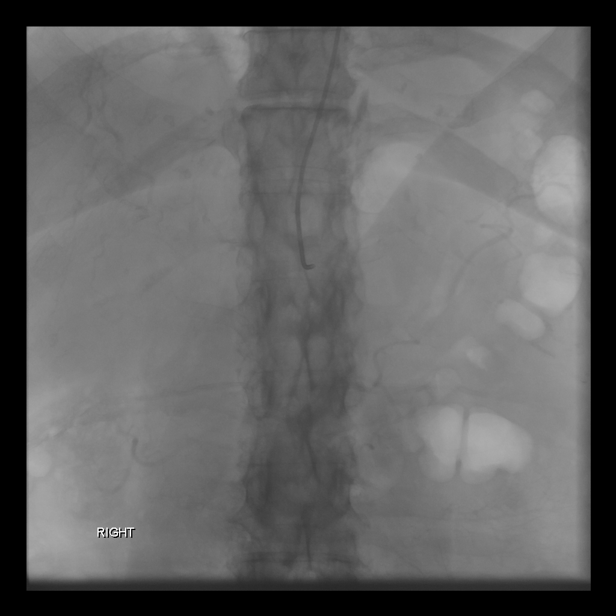

[Series 4: care body 4 · 1 of 68 frames shown (3 of 12)]
[frame 11/68]
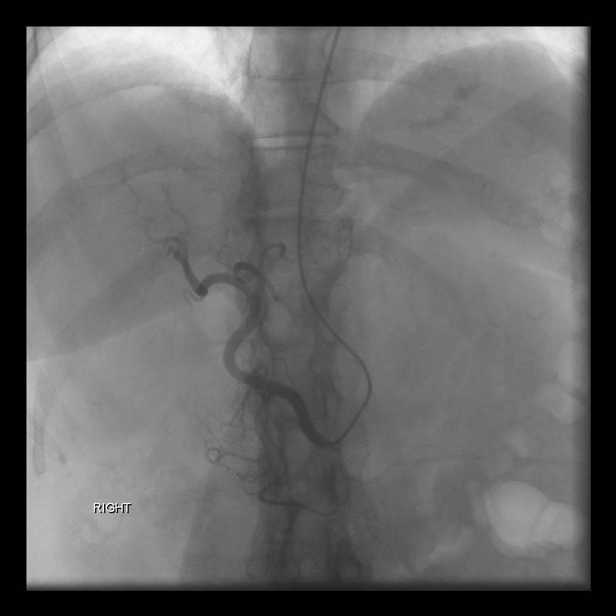

[Series 5: care body 4 · 1 of 47 frames shown (4 of 12)]
[frame 8/47]
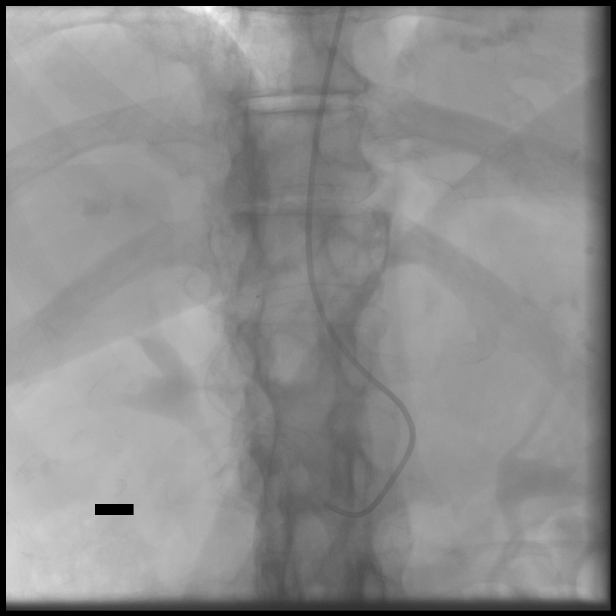

[Series 6: care body 4 · 1 of 45 frames shown (5 of 12)]
[frame 23/45]
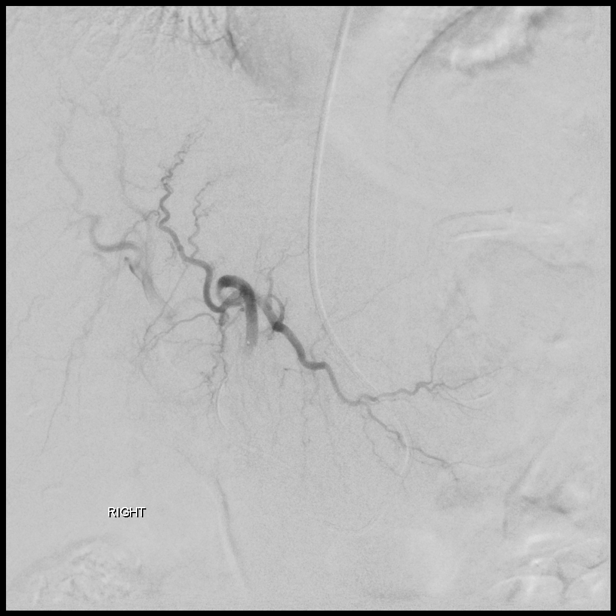

[Series 7: care body 4 · 1 of 37 frames shown (6 of 12)]
[frame 32/37]
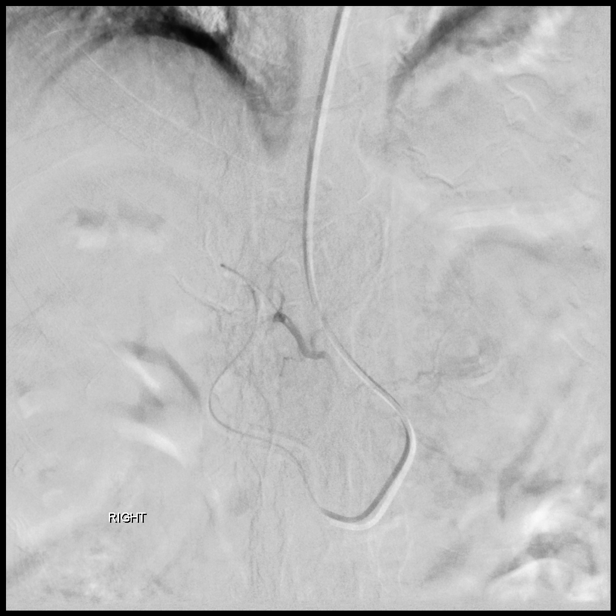

[Series 9: care body 4 · 1 of 28 frames shown (7 of 12)]
[frame 5/28]
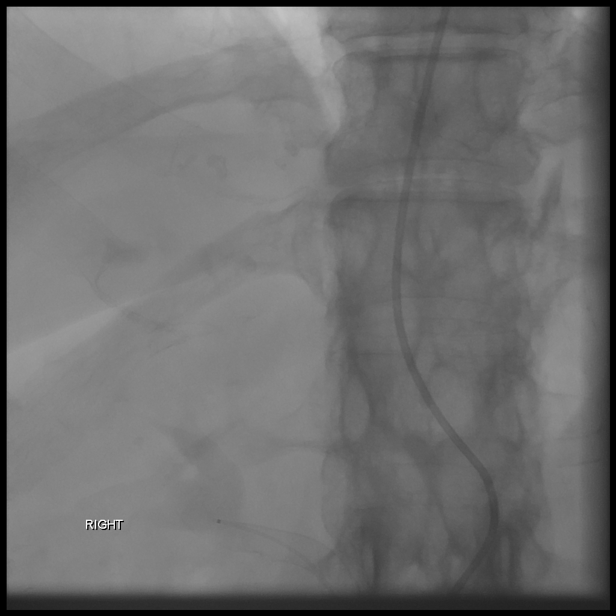

[Series 10: care body 4 · 1 of 21 frames shown (8 of 12)]
[frame 4/21]
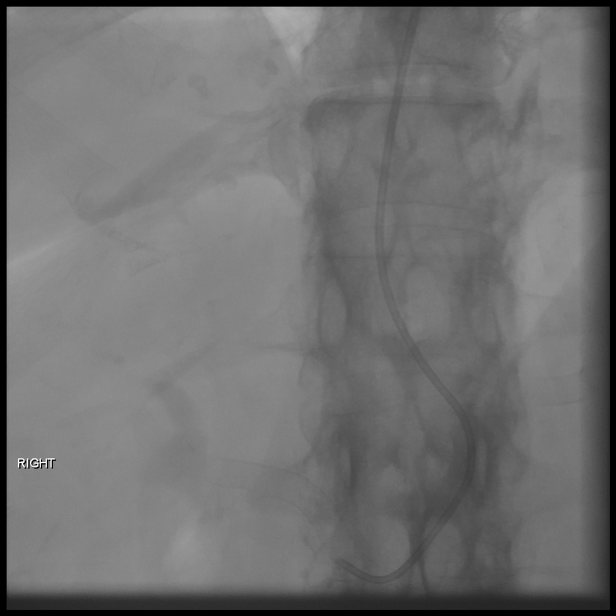

[Series 11: care body 4 · 1 of 22 frames shown (9 of 12)]
[frame 12/22]
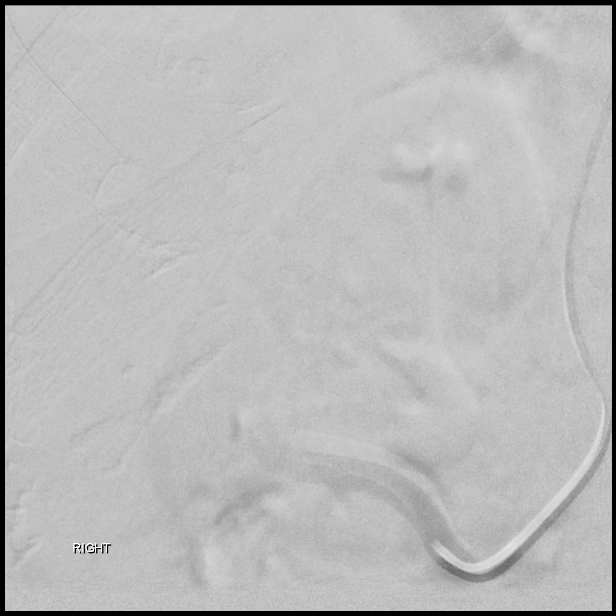

[Series 12: care body 4 · 1 of 21 frames shown (10 of 12)]
[frame 18/21]
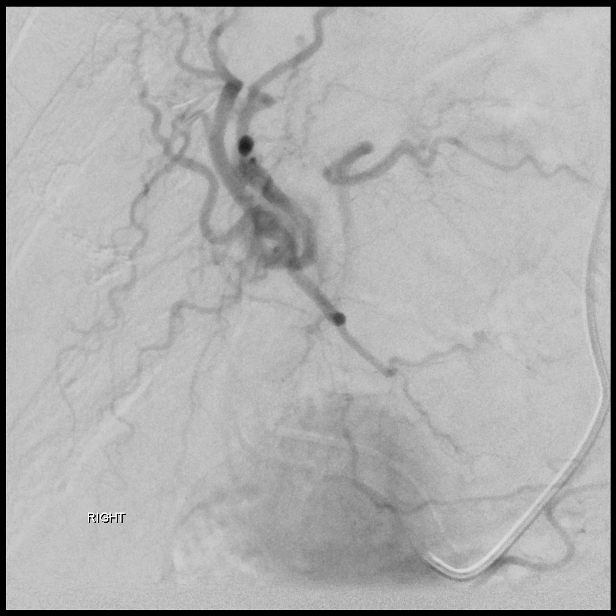

[Series 13: care body 4 · 1 of 26 frames shown (11 of 12)]
[frame 23/26]
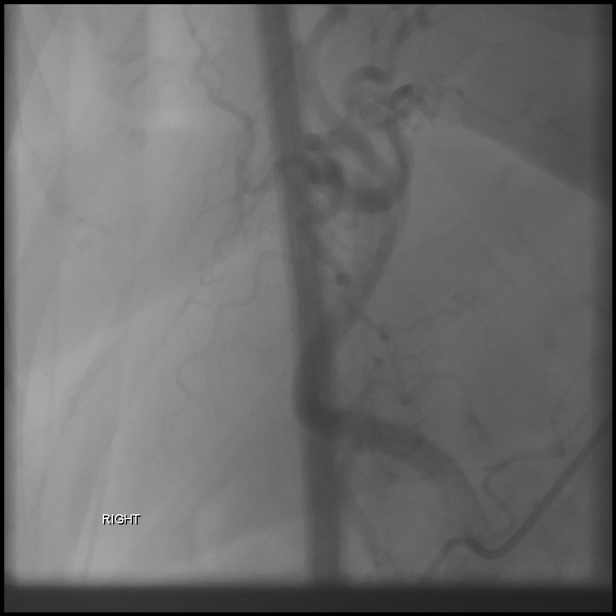

[Series 14: care body 4 · 1 of 36 frames shown (12 of 12)]
[frame 31/36]
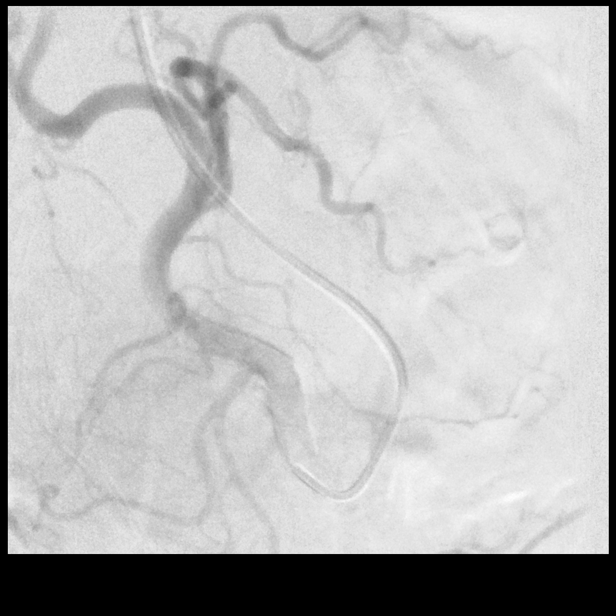

[12 of 24 positions shown; findings below may reference images not displayed]

EXAM:
1. Ultrasound-guided access left radial artery
2. Catheterization of the celiac axis with arteriogram
3. Catheterization of the superior mesenteric artery with
arteriogram
4. Catheterization of the replaced common hepatic artery with
arteriogram
5. Catheterization of the segment [DATE] left hepatic artery with
arteriogram
6. Catheterization of the segment 3 hepatic artery with arteriogram
7. Catheterization of the segment 2 left hepatic artery with
arteriogram

MEDICATIONS:
None

ANESTHESIA/SEDATION:
Moderate (conscious) sedation was employed during this procedure. A
total of Versed 2 mg and Fentanyl 100 mcg was administered
intravenously.

Moderate Sedation Time: 70 minutes. The patient's level of
consciousness and vital signs were monitored continuously by
radiology nursing throughout the procedure under my direct
supervision.

CONTRAST:  100 mL Isovue 300

FLUOROSCOPY TIME:  Fluoroscopy Time: 17 minutes 30 seconds (1,800
mGy).

COMPLICATIONS:
None immediate.



The left radial artery was interrogated with ultrasound and found to
be widely patent and of adequate diameter. An image was obtained and
stored for the medical record. Local anesthesia was attained by
infiltration with 1% lidocaine. Under real-time sonographic
guidance, the vessel was punctured with a 21 gauge micropuncture
needle. Using standard technique, the initial micro needle was
exchanged over a 0.021 micro wire for a hydrophilic 5 French slim
arterial sheath. The sheath was flushed and then a radial cocktail
consisting of [0O] units heparin, 2.5 mg Verapamil and 200 mcg
nitroglycerine was injected through the sheath.

An ultimate 2 catheter was advanced over a Bentson wire into the
abdominal aorta. The catheter was used to select the celiac artery.
An arteriogram was performed. There is variant hepatic arterial
anatomy. The celiac artery gives rise to the splenic artery, left
gastric artery and dorsal pancreatic artery. No evidence of hepatic
artery.

The catheter was next used to select the origin of the superior
mesenteric artery. Arteriography was performed. The common hepatic
arteries completely replaced to the SMA. A glidewire was
successfully advanced into the common hepatic artery and the
ultimate 1 catheter advanced over the wire into the artery. Common
hepatic arteriography was then performed in multiple obliquities.
Prominent supra duodenal artery arising just distal to the
gastroduodenal artery. The right gastric artery is visualized coming
off the proper hepatic artery.

A renegade STC microcatheter was advanced over a Fathom 16 wire and
used to select the segment [DATE] left hepatic artery. Arteriography
was performed. Two hypervascular tumors are present within the
segment 3 distribution. The microcatheter was successfully navigated
beyond the bifurcation into the segment 4 and 3 hepatic arteries and
into the segment 3 hepatic artery. Additional arteriography was
performed. Excellent tumor blush identified. No reflux. No stasis.
No evidence of extrahepatic arterial supply.

The microcatheter was then brought back into the proper hepatic
artery. After several attempts, the segment 2 hepatic artery was
successfully catheterized. Arteriography was performed demonstrating
hypervascular tumor blush in 2 lesions. No evidence of reflux,
stasis or extrahepatic arterial supply.

The microcatheter was brought back into the proper hepatic artery.
Extensive effort was then made to catheterize the right gastric
artery, however this was ultimately unsuccessful. It was felt
acceptable to leave the right gastric artery open as treatment would
be fairly distal in the segment 2 and 3 hepatic arteries and the
chance of reflux into the right gastric artery is near 0.

Therefore, technetium labeled macro aggregated albumin was
successfully injected into the left hepatic artery. The catheters
were removed. Hemostasis was attained with the assistance of a TR
band
IMPRESSION: 1. Variant anatomy with a completely replaced common hepatic artery
to the SMA.
2. Variant anatomy with two left-sided hepatic arteries, one
supplying segments 3 and 4 and one supplying segment 2.
3. Successful multivessel angiogram. There are 4 hypervascular
lesions in the lateral segment of the left hemi liver, two in
segment 2 and two in segment 3.

PLAN:
Return in 2 weeks for split dose segmentectomy targeting segments 2
and 3.

## 2019-09-21 MED ORDER — TECHNETIUM TO 99M ALBUMIN AGGREGATED: 5.4000 | Freq: Once | INTRAVENOUS | Status: DC | PRN

## 2019-09-21 MED ORDER — FUROSEMIDE 20 MG PO TABS: 20.0000 mg | ORAL_TABLET | Freq: Every day | ORAL | 0 refills | Status: DC

## 2019-09-21 MED ORDER — VERAPAMIL HCL 2.5 MG/ML IV SOLN: INTRA_ARTERIAL | Status: AC | PRN

## 2019-09-21 MED ORDER — IOHEXOL 300 MG/ML  SOLN
100.0000 mL | Freq: Once | INTRAMUSCULAR | Status: AC | PRN
  Administered 2019-09-21: 27 mL via INTRA_ARTERIAL

## 2019-09-21 MED ORDER — HEPARIN SODIUM (PORCINE) 1000 UNIT/ML IJ SOLN
INTRAMUSCULAR | Status: AC
  Filled 2019-09-21: qty 1

## 2019-09-21 MED ORDER — NITROGLYCERIN IN D5W 100-5 MCG/ML-% IV SOLN
INTRAVENOUS | Status: AC
  Filled 2019-09-21: qty 250

## 2019-09-21 MED ORDER — IOHEXOL 300 MG/ML  SOLN
100.0000 mL | Freq: Once | INTRAMUSCULAR | Status: AC | PRN
  Administered 2019-09-21: 50 mL via INTRA_ARTERIAL

## 2019-09-21 MED ORDER — FENTANYL CITRATE (PF) 100 MCG/2ML IJ SOLN
INTRAMUSCULAR | Status: AC | PRN
  Administered 2019-09-21 (×2): 50 ug via INTRAVENOUS

## 2019-09-21 MED ORDER — LIDOCAINE HCL 1 % IJ SOLN
INTRAMUSCULAR | Status: AC
  Filled 2019-09-21: qty 20

## 2019-09-21 MED ORDER — LIDOCAINE HCL (PF) 1 % IJ SOLN
INTRAMUSCULAR | Status: AC | PRN
  Administered 2019-09-21: 5 mL

## 2019-09-21 MED ORDER — MIDAZOLAM HCL 2 MG/2ML IJ SOLN
INTRAMUSCULAR | Status: AC
  Filled 2019-09-21: qty 4

## 2019-09-21 MED ORDER — MIDAZOLAM HCL 2 MG/2ML IJ SOLN
INTRAMUSCULAR | Status: AC | PRN
  Administered 2019-09-21 (×2): 1 mg via INTRAVENOUS

## 2019-09-21 MED ORDER — SODIUM CHLORIDE 0.9 % IV SOLN: INTRAVENOUS | Status: DC

## 2019-09-21 MED ORDER — FENTANYL CITRATE (PF) 100 MCG/2ML IJ SOLN
INTRAMUSCULAR | Status: AC
  Filled 2019-09-21: qty 4

## 2019-09-21 MED ORDER — VERAPAMIL HCL 2.5 MG/ML IV SOLN
INTRAVENOUS | Status: AC
  Filled 2019-09-21: qty 2

## 2019-09-21 NOTE — Sedation Documentation (Signed)
Transfer to [place]

## 2019-09-21 NOTE — Discharge Instructions (Signed)
Please call Interventional Radiology clinic [phone] with any questions or concerns.  You may remove your dressing and shower tomorrow.    Hepatic Artery Radioembolization, Care After This sheet gives you information about how to care for yourself after your procedure. Your health care provider may also give you more specific instructions. If you have problems or questions, contact your health care provider. What can I expect after the procedure? After the procedure, it is common to have:  A slight fever for 1-2 weeks. If your fever gets worse, tell your health care provider.  Fatigue.  Loss of appetite. This should gradually improve after about 1 week.  Abdominal pain on your right side.  Soreness and tenderness in your groin area where the needle and catheter were placed (puncture site). Follow these instructions at home:  Puncture site care  Follow instructions from your health care provider about how to take care of the puncture site. Make sure you: ? Wash your hands with soap and water before you change your bandage (dressing). If soap and water are not available, use hand sanitizer. ? Change your dressing as told by your health care provider. ? Leave stitches (sutures), skin glue, or adhesive strips in place. These skin closures may need to stay in place for 2 weeks or longer. If adhesive strip edges start to loosen and curl up, you may trim the loose edges. Do not remove adhesive strips completely unless your health care provider tells you to do that.  Check your puncture site every day for signs of infection. Check for: ? More redness, swelling, or pain. ? More fluid or blood. ? Warmth. ? Pus or a bad smell. Activity  Rest and return to your normal activities as told by your health care provider. Ask your health care provider what activities are safe for you.  Do not drive for 24 hours after the procedure if you were given a medicine to help you relax  (sedative).  Do not lift anything that is heavier than 10 lb (4.5 kg) until your health care provider says that it is safe. Medicines  Take over-the-counter and prescription medicines only as told by your health care provider.  Do not drive or use heavy machinery while taking prescription pain medicine.   Radiation precautions after next procedure  For up to a week after your procedure, there will be a small amount of radioactivity near your liver. This is not especially dangerous to other people. However, you should follow these precautions for 7 days: ? Do not come in close contact with people. ? Do not sleep in the same bed as someone else. ? Do not hold children or babies. ? Do not have contact with pregnant women.   General instructions  To prevent or treat constipation while you are taking prescription pain medicine, your health care provider may recommend that you: ? Drink enough fluid to keep your urine clear or pale yellow. ? Take over-the-counter or prescription medicines. ? Eat foods that are high in fiber, such as fresh fruits and vegetables, whole grains, and beans. ? Limit foods that are high in fat and processed sugars, such as fried and sweet foods.  Eat frequent small meals until your appetite returns. Follow instructions from your health care provider about eating or drinking restrictions.  Do not take baths, swim, or use a hot tub until your health care provider approves. You may take showers. Wash your puncture site with mild soap and water and pat the area dry.  Wear compression stockings as told by your health care provider. These stockings help to prevent blood clots and reduce swelling in your legs.  Keep all follow-up visits as told by your health care provider. This is important. You may need to have blood tests and imaging tests done. Contact a health care provider if:  You have more redness, swelling, or pain around your puncture site.  You have more  fluid or blood coming from your puncture site.  Your puncture site feels warm to the touch.  You have pus or a bad smell coming from your puncture site.  You have pain that: ? Gets worse. ? Does not get better with medicine. ? Feels like very bad heartburn. ? Is in the middle of your abdomen, above your belly button.  Your skin and the white parts of your eyes turn yellow (jaundice).  The color of your urine changes to dark brown.  The color of your stool changes to light yellow.  Your abdominal measurement (girth) increases in a short period of time.  You gain more than 5 lb (2.3 kg) in a short period of time. Get help right away if:  You have a fever that lasts longer than 2 weeks or is higher than what your health care provider told you to expect.  You develop any of the following in your legs: ? Pain. ? Swelling. ? Skin that is cold or pale or turns blue.  You have chest pain.  You have blood in your vomit, saliva, or stool.  You have trouble breathing. This information is not intended to replace advice given to you by your health care provider. Make sure you discuss any questions you have with your health care provider. Document Revised: 03/18/2017 Document Reviewed: 01/03/2016 Elsevier Patient Education  [place].   Moderate Conscious Sedation, Adult, Care After These instructions provide you with information about caring for yourself after your procedure. Your health care provider may also give you more specific instructions. Your treatment has been planned according to current medical practices, but problems sometimes occur. Call your health care provider if you have any problems or questions after your procedure. What can I expect after the procedure? After your procedure, it is common:  To feel sleepy for several hours.  To feel clumsy and have poor balance for several hours.  To have poor judgment for several hours.  To vomit if you eat too  soon. Follow these instructions at home: For at least 24 hours after the procedure:   Do not: ? Participate in activities where you could fall or become injured. ? Drive. ? Use heavy machinery. ? Drink alcohol. ? Take sleeping pills or medicines that cause drowsiness. ? Make important decisions or sign legal documents. ? Take care of children on your own.  Rest. Eating and drinking  Follow the diet recommended by your health care provider.  If you vomit: ? Drink water, juice, or soup when you can drink without vomiting. ? Make sure you have little or no nausea before eating solid foods. General instructions  Have a responsible adult stay with you until you are awake and alert.  Take over-the-counter and prescription medicines only as told by your health care provider.  If you smoke, do not smoke without supervision.  Keep all follow-up visits as told by your health care provider. This is important. Contact a health care provider if:  You keep feeling nauseous or you keep vomiting.  You feel light-headed.  You  develop a rash.  You have a fever. Get help right away if:  You have trouble breathing. This information is not intended to replace advice given to you by your health care provider. Make sure you discuss any questions you have with your health care provider. Document Revised: 03/18/2017 Document Reviewed: 07/26/2015 Elsevier Patient Education  [place] AFTER NEXT PROCEDURE  Post Y-90 Radioembolization Discharge Instructions  You have been given a radioactive material during your procedure.  While it is safe for you to be discharged home from the hospital, you need to proceed directly home.    Do not use public transportation, including air travel, lasting more than 2 hours for 1 week.  Avoid crowded public places for 1 week.  Adult visitors should try to avoid close contact with you for 1 week.    Children and pregnant  females should not visit or have close contact with you for 1 week.  Items that you touch are not radioactive.  Do not sleep in the same bed as your partner for 1 week, and a condom should be used for sexual activity during the first 24 hours.  Your blood may be radioactive and caution should be used if any bleeding occurs during the recovery period.  Body fluids may be radioactive for 24 hours.  Wash your hands after voiding.  Men should sit to urinate.  Dispose of any soiled materials (flush down toilet or place in trash at home) during the first day.  Drink 6 to 8 glasses of fluids per day for 5 days to hydrate yourself.  If you need to see a doctor during the first week, you must let them know that you were treated with yttrium-90 microspheres, and will be slightly radioactive.  They can call Interventional Radiology [phone] with any questions.

## 2019-09-21 NOTE — Procedures (Signed)
Interventional Radiology Procedure Note  Procedure: OVP-C34 planning angiogram.   Complications: None  Estimated Blood Loss: None  Recommendations: - DC home - LEFT 2/3 segmentectomy in 2 weeks   Signed,  Criselda Peaches, MD

## 2019-09-21 NOTE — Telephone Encounter (Signed)
Pt called that her both  leg are swelling and she don't have lasix 20 mg at home as per dr send pres and also advised pt not feeling need to been seen

## 2019-09-21 NOTE — Telephone Encounter (Signed)
Pt called her both leg swelling and she had lasix before as per dr Humphrey Rolls send lasix and advised not feeling better need to been seen

## 2019-09-21 NOTE — H&P (Addendum)
Referring Physician(s): Yu,Z  Supervising Physician: Jacqulynn Cadet  Patient Status:  Adriana Miller  Chief Complaint: Hepatocellular carcinoma   Subjective: Patient familiar to IR service from consultation with Dr. Laurence Ferrari on 08/28/2019 to discuss treatment options for newly diagnosed hepatocellular carcinoma.  She has a history of alcoholic cirrhosis now complicated by 2 small enhancing lesions in the left liver (segment 2 and segment 3) with imaging features diagnostic of hepatocellular carcinoma.  There is also a third arterially enhancing lesion in the left hepatic lobe likely representing a third focus of [place].  She was deemed an appropriate candidate for Y 90 hepatic radioembolization and presents today for hepatic/visceral arteriogram with embolization and test Y -90 dosing in preparation for treatment.  Additional medical history as below.  She currently denies fever, headache, chest pain, worsening dyspnea, abdominal/back pain, nausea, vomiting or bleeding.  She does have occasional cough.  Past Medical History:  Diagnosis Date  . B12 deficiency 04/05/2017  . Blood in stool   . Cerebral aneurysm   . Chronic kidney disease   . Diabetes mellitus without complication ([place])   . Gastric erosion with bleeding   . Hyperlipidemia   . Hypertension   . Lower extremity cellulitis 02/08/2019  . Seizure disorder [facility])    Past Surgical History:  Procedure Laterality Date  . CEREBRAL ANEURYSM REPAIR  1991  . COLONOSCOPY WITH PROPOFOL N/A 02/12/2019   Procedure: COLONOSCOPY WITH PROPOFOL;  Surgeon: Lin Landsman, MD;  Location: [facility] ENDOSCOPY;  Service: Gastroenterology;  Laterality: N/A;  . ESOPHAGOGASTRODUODENOSCOPY (EGD) WITH PROPOFOL N/A 02/12/2019   Procedure: ESOPHAGOGASTRODUODENOSCOPY (EGD) WITH PROPOFOL;  Surgeon: Lin Landsman, MD;  Location: [facility] ENDOSCOPY;  Service: Gastroenterology;  Laterality: N/A;  . IR RADIOLOGIST EVAL & MGMT  08/28/2019        Allergies: Aspirin  Medications: Prior to Admission medications   Medication Sig Start Date End Date Taking? Authorizing Provider  allopurinol (ZYLOPRIM) 100 MG tablet Take 1 tablet (100 mg total) by mouth daily. 08/02/19  Yes Scarboro, Audie Clear, NP  diltiazem (CARTIA XT) 240 MG 24 hr capsule Take 1 capsule (240 mg total) by mouth daily. 07/26/19 07/25/20 Yes Vashti Hey, MD  escitalopram (LEXAPRO) 10 MG tablet Take 1 tablet (10 mg total) by mouth daily. For anxiety/sleep 03/20/19  Yes Lavera Guise, MD  furosemide (LASIX) 20 MG tablet Take 20 mg by mouth daily.   Yes [provider]  glimepiride (AMARYL) 1 MG tablet Take 1 tablet (1 mg total) by mouth every morning. 08/02/19 08/01/20 Yes Scarboro, Audie Clear, NP  metFORMIN (GLUCOPHAGE) 500 MG tablet Take 1 tablet (500 mg total) by mouth daily with breakfast. 08/02/19 08/01/20 Yes Scarboro, Audie Clear, NP  metoprolol tartrate (LOPRESSOR) 25 MG tablet Take 25 mg by mouth daily.   Yes [provider]  spironolactone (ALDACTONE) 25 MG tablet Take 1 tablet (25 mg total) by mouth daily. For cirrhosis of liver 06/19/19  Yes Scarboro, Audie Clear, NP  traZODone (DESYREL) 50 MG tablet Take 0.5-1 tablets (25-50 mg total) by mouth at bedtime as needed for sleep. 09/05/19  Yes Borders, Kirt Boys, NP  apixaban (ELIQUIS) 5 MG TABS tablet Take 1 tablet (5 mg total) by mouth 2 (two) times daily. 06/12/19   Loel Dubonnet, NP     Vital Signs: BP (!) 156/76   Pulse 81   Temp 98.1 F (36.7 C) (Oral)   Resp 16   SpO2 100%   Physical Exam awake, alert.  Chest  clear to auscultation bilaterally.  Heart with nl rate, occ ectopy.  Abdomen soft, positive bowel sounds, nontender.  Bilateral lower extremity edema noted.  Imaging: No results found.  Labs:  CBC: Recent Labs    06/22/19 1546 06/27/19 1024 07/25/19 1436 07/26/19 0807  WBC 5.5 5.2 7.8 6.3  HGB 12.8 11.7* 13.7 11.2*  HCT 36.8 36.7 40.3 32.7*  PLT 173 132* 158 141*     COAGS: Recent Labs    02/08/19 1551  INR 2.6*    BMP: Recent Labs    06/06/19 1538 06/27/19 1024 07/25/19 1436 07/26/19 0807  NA 140 134* 126* 132*  K 4.7 4.3 4.2 3.8  CL 102 100 87* 102  CO2 21 24 25 22   GLUCOSE 215* 318* 621* 312*  BUN 24 24* 27* 20  CALCIUM 9.5 9.2 9.8 8.7*  CREATININE 1.24* 1.12* 1.73* 1.20*  GFRNONAA 46* 52* 30* 47*  GFRAA 53* 60* 35* 55*    LIVER FUNCTION TESTS: Recent Labs    02/09/19 0634 03/28/19 1700 06/27/19 1024 07/26/19 0807  BILITOT 2.2* 2.4* 0.8 1.3*  AST 45* 42* 24 21  ALT 18 16 16 14   ALKPHOS 34* 56 85 75  PROT 6.5 9.3* 8.7* 7.6  ALBUMIN 2.1* 2.8* 3.1* 2.8*    Assessment and Plan: 64 yo female with history of alcoholic cirrhosis now complicated by 2 small enhancing lesions in the left liver (segment 2 and segment 3) with imaging features diagnostic of hepatocellular carcinoma.  There is also a third arterially enhancing lesion in the left hepatic lobe likely representing a third focus of [place].  She was deemed an appropriate candidate for Y 90 hepatic radioembolization following recent consultation with Dr. Laurence Ferrari and presents today for hepatic/visceral arteriogram with embolization and test Y -90 dosing in preparation for treatment. Risks and benefits of procedure were discussed with the patient including, but not limited to bleeding, infection, vascular injury or contrast induced renal failure.  This interventional procedure involves the use of X-rays and because of the nature of the planned procedure, it is possible that we will have prolonged use of X-ray fluoroscopy.  Potential radiation risks to you include (but are not limited to) the following: - A slightly elevated risk for cancer  several years later in life. This risk is typically less than 0.5% percent. This risk is low in comparison to the normal incidence of human cancer, which is 33% for women and 50% for men according to the [place]. -  Radiation induced injury can include skin redness, resembling a rash, tissue breakdown / ulcers and hair loss (which can be temporary or permanent).   The likelihood of either of these occurring depends on the difficulty of the procedure and whether you are sensitive to radiation due to previous procedures, disease, or genetic conditions.   IF your procedure requires a prolonged use of radiation, you will be notified and given written instructions for further action.  It is your responsibility to monitor the irradiated area for the 2 weeks following the procedure and to notify your physician if you are concerned that you have suffered a radiation induced injury.    All of the patient's questions were answered, patient is agreeable to proceed.  Consent signed and in chart.      Electronically Signed: D. Rowe Robert, PA-C 09/21/2019, 9:35 AM   I spent a total of 25 minutes at the the patient's bedside AND on the patient's hospital floor or unit, greater than 50% of  which was counseling/coordinating care for hepatic/visceral arteriogram with embolization and test Y 90 dosing

## 2019-09-21 NOTE — Sedation Documentation (Signed)
Transferring to Short Stay

## 2019-09-22 ENCOUNTER — Other Ambulatory Visit: Payer: Self-pay | Admitting: Internal Medicine

## 2019-09-30 ENCOUNTER — Other Ambulatory Visit: Payer: Self-pay | Admitting: Radiology

## 2019-10-01 ENCOUNTER — Other Ambulatory Visit: Payer: Self-pay | Admitting: Radiology

## 2019-10-02 ENCOUNTER — Ambulatory Visit (HOSPITAL_COMMUNITY)
Admission: RE | Admit: 2019-10-02 | Discharge: 2019-10-02 | Disposition: A | Discharge status: Home / Self Care | Payer: Medicare Other | Source: Ambulatory Visit | Attending: Interventional Radiology | Admitting: Interventional Radiology

## 2019-10-02 ENCOUNTER — Encounter (HOSPITAL_COMMUNITY): Payer: Self-pay

## 2019-10-02 ENCOUNTER — Other Ambulatory Visit (HOSPITAL_COMMUNITY): Payer: Self-pay | Admitting: Interventional Radiology

## 2019-10-02 ENCOUNTER — Other Ambulatory Visit: Payer: Self-pay

## 2019-10-02 ENCOUNTER — Encounter (HOSPITAL_COMMUNITY)
Admission: RE | Admit: 2019-10-02 | Discharge: 2019-10-02 | Disposition: A | Discharge status: Home / Self Care | Payer: Medicare Other | Source: Ambulatory Visit | Attending: Interventional Radiology | Admitting: Interventional Radiology

## 2019-10-02 DIAGNOSIS — C220 Liver cell carcinoma: Secondary | ICD-10-CM | POA: Insufficient documentation

## 2019-10-02 DIAGNOSIS — E1122 Type 2 diabetes mellitus with diabetic chronic kidney disease: Secondary | ICD-10-CM | POA: Diagnosis not present

## 2019-10-02 DIAGNOSIS — N189 Chronic kidney disease, unspecified: Secondary | ICD-10-CM | POA: Diagnosis not present

## 2019-10-02 DIAGNOSIS — Z79899 Other long term (current) drug therapy: Secondary | ICD-10-CM | POA: Diagnosis not present

## 2019-10-02 DIAGNOSIS — R569 Unspecified convulsions: Secondary | ICD-10-CM | POA: Insufficient documentation

## 2019-10-02 DIAGNOSIS — Z7984 Long term (current) use of oral hypoglycemic drugs: Secondary | ICD-10-CM | POA: Diagnosis not present

## 2019-10-02 DIAGNOSIS — Z7901 Long term (current) use of anticoagulants: Secondary | ICD-10-CM | POA: Diagnosis not present

## 2019-10-02 DIAGNOSIS — E785 Hyperlipidemia, unspecified: Secondary | ICD-10-CM | POA: Diagnosis not present

## 2019-10-02 DIAGNOSIS — K7030 Alcoholic cirrhosis of liver without ascites: Secondary | ICD-10-CM | POA: Insufficient documentation

## 2019-10-02 DIAGNOSIS — I129 Hypertensive chronic kidney disease with stage 1 through stage 4 chronic kidney disease, or unspecified chronic kidney disease: Secondary | ICD-10-CM | POA: Insufficient documentation

## 2019-10-02 HISTORY — PX: IR ANGIOGRAM VISCERAL SELECTIVE: IMG657

## 2019-10-02 HISTORY — PX: IR EMBO TUMOR ORGAN ISCHEMIA INFARCT INC GUIDE ROADMAPPING: IMG5449

## 2019-10-02 HISTORY — PX: IR ANGIOGRAM SELECTIVE EACH ADDITIONAL VESSEL: IMG667

## 2019-10-02 HISTORY — PX: IR US GUIDE VASC ACCESS LEFT: IMG2389

## 2019-10-02 LAB — COMPREHENSIVE METABOLIC PANEL
ALT: 15 U/L (ref 0–44)
AST: 23 U/L (ref 15–41)
Albumin: 3.6 g/dL (ref 3.5–5.0)
Alkaline Phosphatase: 89 U/L (ref 38–126)
Anion gap: 11 (ref 5–15)
BUN: 20 mg/dL (ref 8–23)
CO2: 27 mmol/L (ref 22–32)
Calcium: 9.4 mg/dL (ref 8.9–10.3)
Chloride: 103 mmol/L (ref 98–111)
Creatinine, Ser: 1.19 mg/dL — ABNORMAL HIGH (ref 0.44–1.00)
GFR calc Af Amer: 55 mL/min — ABNORMAL LOW (ref >60)
GFR calc non Af Amer: 48 mL/min — ABNORMAL LOW (ref >60)
Glucose, Bld: 147 mg/dL — ABNORMAL HIGH (ref 70–99)
Potassium: 3.8 mmol/L (ref 3.5–5.1)
Sodium: 141 mmol/L (ref 135–145)
Total Bilirubin: 0.6 mg/dL (ref 0.3–1.2)
Total Protein: 8.7 g/dL — ABNORMAL HIGH (ref 6.5–8.1)

## 2019-10-02 LAB — CBC WITH DIFFERENTIAL/PLATELET
Abs Immature Granulocytes: 0.01 10*3/uL (ref 0.00–0.07)
Basophils Absolute: 0 10*3/uL (ref 0.0–0.1)
Basophils Relative: 1 %
Eosinophils Absolute: 0.1 10*3/uL (ref 0.0–0.5)
Eosinophils Relative: 2 %
HCT: 37.5 % (ref 36.0–46.0)
Hemoglobin: 12 g/dL (ref 12.0–15.0)
Immature Granulocytes: 0 %
Lymphocytes Relative: 31 %
Lymphs Abs: 1.9 10*3/uL (ref 0.7–4.0)
MCH: 30.8 pg (ref 26.0–34.0)
MCHC: 32 g/dL (ref 30.0–36.0)
MCV: 96.4 fL (ref 80.0–100.0)
Monocytes Absolute: 0.5 10*3/uL (ref 0.1–1.0)
Monocytes Relative: 9 %
Neutro Abs: 3.5 10*3/uL (ref 1.7–7.7)
Neutrophils Relative %: 57 %
Platelets: 151 10*3/uL (ref 150–400)
RBC: 3.89 MIL/uL (ref 3.87–5.11)
RDW: 13.8 % (ref 11.5–15.5)
WBC: 6.1 10*3/uL (ref 4.0–10.5)
nRBC: 0 % (ref 0.0–0.2)

## 2019-10-02 LAB — PROTIME-INR
INR: 1.2 (ref 0.8–1.2)
Prothrombin Time: 14.9 seconds (ref 11.4–15.2)

## 2019-10-02 LAB — GLUCOSE, CAPILLARY: Glucose-Capillary: 137 mg/dL — ABNORMAL HIGH (ref 70–99)

## 2019-10-02 IMAGING — XA IR US GUIDE VASC ACCESS LEFT
11 of 14 series · 12 of 24 positions shown · IV contrast (IODINE)
Comparison: none

INDICATION: 64-year-old female with alcoholic cirrhosis complicated by
multifocal hepatocellular carcinoma isolated to the lateral segments
of the left hepatic lobe. She presents today for trans arterial
radioembolization.

[Series 1: care body 4 · 1 of 16 frames shown (1 of 9)]
[frame 16/16]
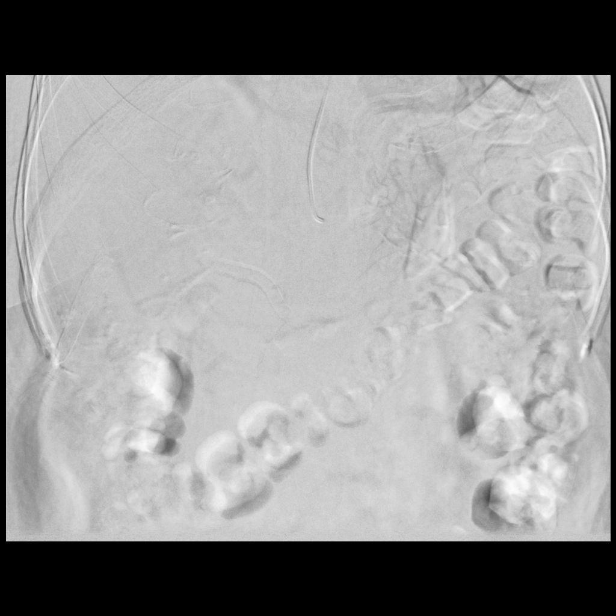

[Series 2: care body 4 · 1 of 21 frames shown (2 of 9)]
[frame 18/21]
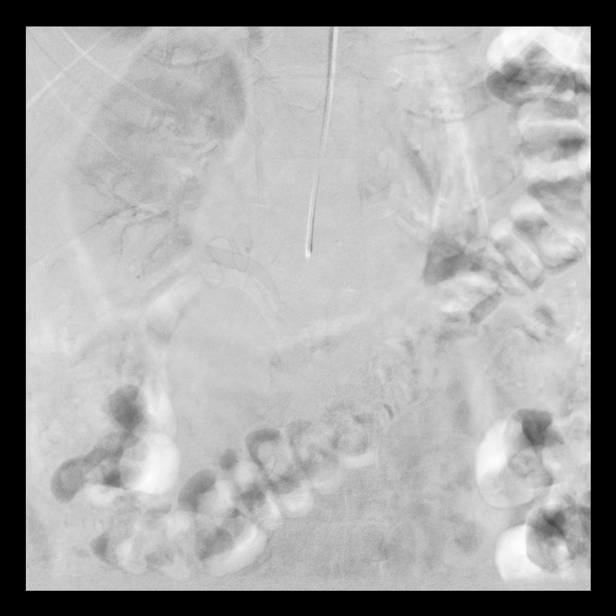

[Series 4: care body 4 · 1 of 19 frames shown (3 of 9)]
[frame 3/19]
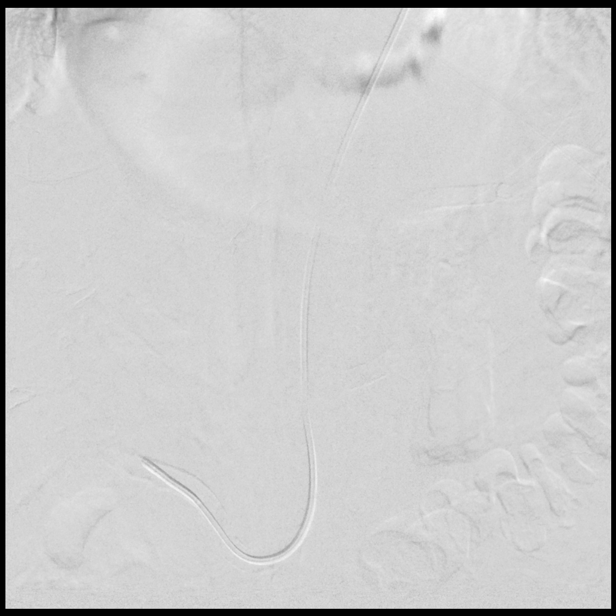

[Series 5: care body 4 · 1 of 21 frames shown (4 of 9)]
[frame 18/21]
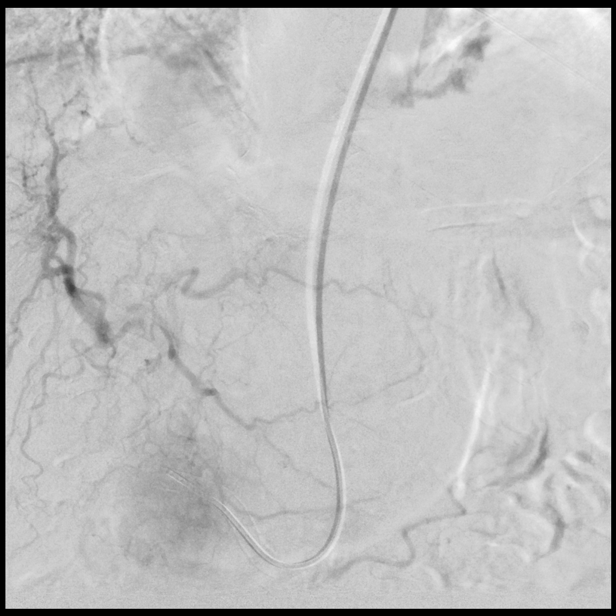

[Series 6: care body 4 · 1 of 27 frames shown (5 of 9)]
[frame 23/27]
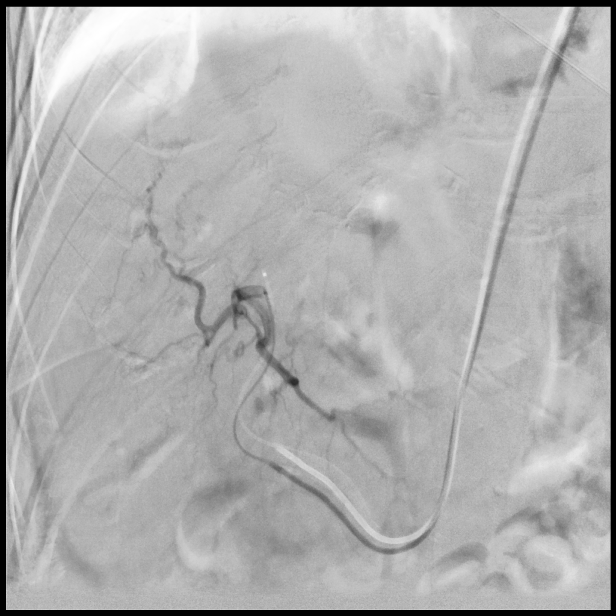

[Series 8: care body 4 · 1 of 47 frames shown (6 of 9)]
[frame 8/47]
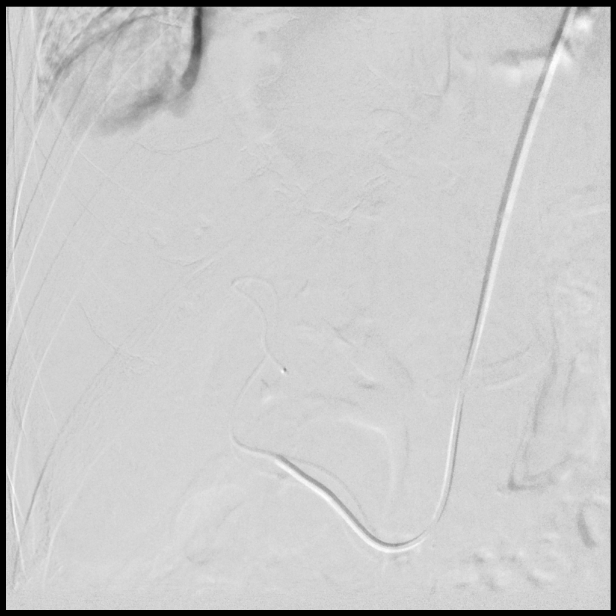

[Series 9: fl - angio · 1 of 117 frames shown]
[frame 47/117]
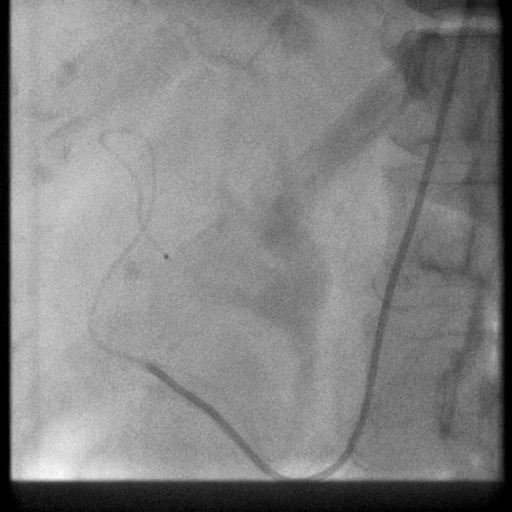

[Series 10: care body 4 · 1 of 47 frames shown (7 of 9)]
[frame 40/47]
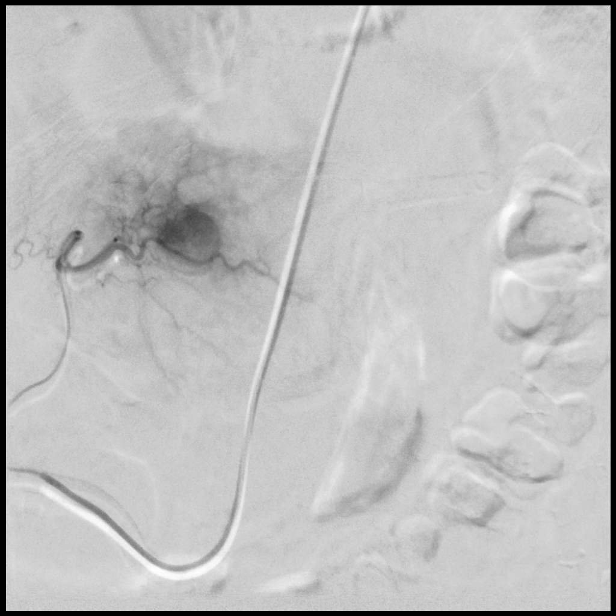

[Series 12: care body 4 · 1 of 33 frames shown (8 of 9)]
[frame 17/33]
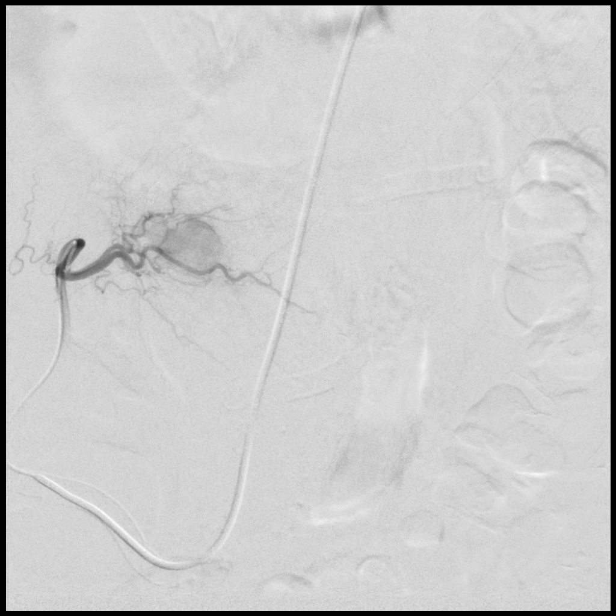

[Series 13: care body 4 · 1 of 57 frames shown (9 of 9)]
[frame 49/57]
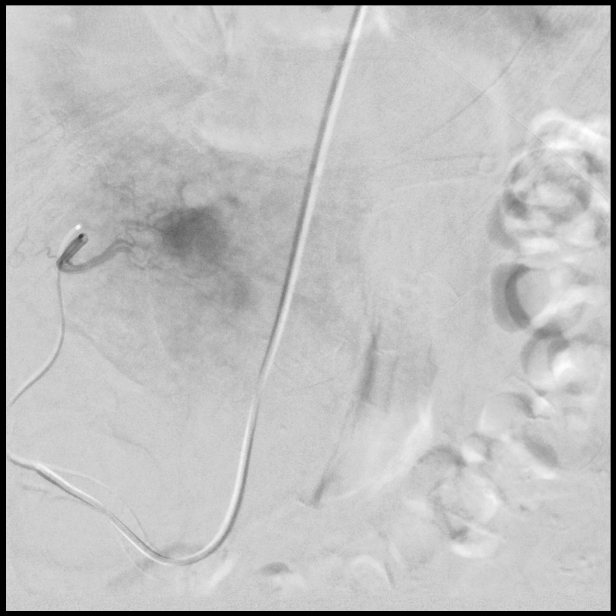

[Series 300: ld dsa body · 2 of 13 slices shown]
[im 6/13]
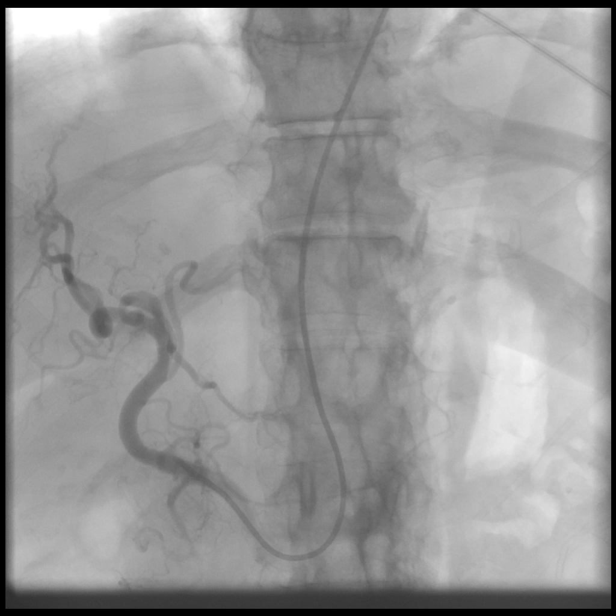
[im 13/13]
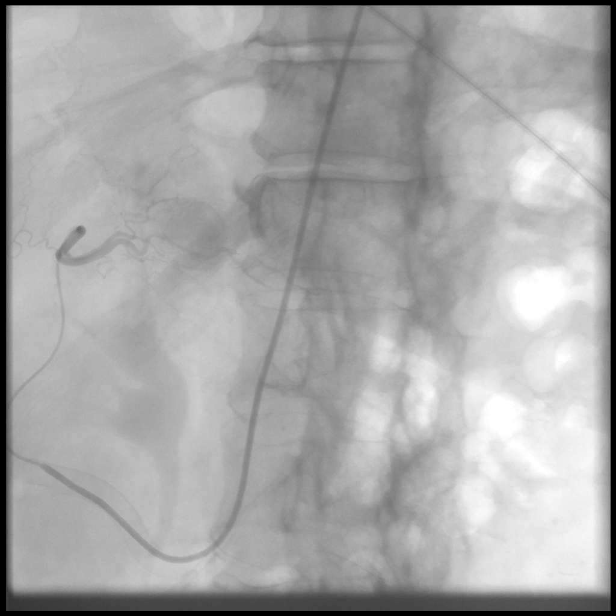

[12 of 24 positions shown; findings below may reference images not displayed]

EXAM:
IR EMBO TUMOR ORGAN ISCHEMIA INFARCT INC GUIDE ROADMAPPING;
SELECTIVE VISCERAL ARTERIOGRAPHY; ADDITIONAL ARTERIOGRAPHY; IR
ULTRASOUND GUIDANCE VASC ACCESS LEFT

1. Ultrasound-guided vascular access left radial artery
2. Catheterization of the superior mesenteric artery with
arteriogram
3. Catheterization of the common hepatic artery with arteriogram
4. Catheterization of the common trunk of the segment [DATE] hepatic
artery
5. Catheterization of the segment 3 hepatic artery
6. Trans arterial radiation segmentectomy of hepatic segment 3
7. Catheterization of the segment 2 hepatic artery
8. Trans arterial radiation segmentectomy of hepatic segment 2

MEDICATIONS:
3.375 g Zosyn. The antibiotic was administered within 1 hour of the
procedure

ANESTHESIA/SEDATION:
Moderate (conscious) sedation was employed during this procedure. A
total of Versed 2 mg and Fentanyl 100 mcg was administered
intravenously.

Moderate Sedation Time: 65 minutes. The patient's level of
consciousness and vital signs were monitored continuously by
radiology nursing throughout the procedure under my direct
supervision.

CONTRAST:  75mL OMNIPAQUE IOHEXOL 300 MG/ML SOLN, 20mL OMNIPAQUE
IOHEXOL 300 MG/ML SOLN

FLUOROSCOPY TIME:  Fluoroscopy Time: 9 minutes 24 seconds ([AQ]
mGy).

COMPLICATIONS:
None immediate.



The left radial artery was interrogated with ultrasound and found to
be widely patent and of adequate diameter. An image was obtained and
stored for the medical record. Local anesthesia was attained by
infiltration with 1% lidocaine. Under real-time sonographic
guidance, the vessel was punctured with a 21 gauge micropuncture
needle. Using standard technique, the initial micro needle was
exchanged over a 0.021 micro wire for a hydrophilic 5 French slim
arterial sheath. The sheath was flushed and then a radial cocktail
consisting of [AQ] units heparin, 2.5 mg Verapamil and 200 mcg
nitroglycerine was injected through the sheath.

An ultimate 1 catheter was advanced over a Bentson wire into the
abdominal aorta. The superior mesenteric artery was successfully
catheterized with the catheter. Arteriography was performed
identifying the origin of the replaced common hepatic artery.
Utilizing a Glidewire, the common hepatic artery was successfully
catheterized. Arteriography was then performed to identify the
origins of the segment 2 and segment [DATE] hepatic arteries.

A renegade MALLA microcatheter was introduced over a Fathom 16 wire
and advanced into the common trunk of the segment [DATE] hepatic
artery. Additional arteriography was then performed in different
obliquities in order to separate the branching pattern between
hepatic segments 3 and 4. The microcatheter was successfully
advanced beyond the origin of the segment 4 artery and into the
segment 3 artery. Arteriography was performed confirming that the
catheter is indeed within the segment 3 hepatic artery and
identifying tumor blush within hepatic segment 3. The microcatheter
was then fixed in place. Trans arterial radio embolization was then
performed utilizing resin [AGE] microspheres. This was performed in
small aliquots according to our standard protocol. Antegrade flow
was confirmed within the vessel throughout the radioembolization.
There was no stasis.

The microcatheter was then removed and appropriately disposed of. A
new Renegade high flow microcatheter was reintroduced through the 5
French base catheter and used to select the segment 2 artery. The
microcatheter was advanced deeper into the segment 2 artery and
arteriography was performed. Tumor blush was identified in hepatic
segment 2. The microcatheter was fixed in place. Trans arterial
radio embolization was then performed using our standard technique.
Antegrade flow was confirmed in the segment 2 artery throughout the
procedure. Stasis was not attained.

The microcatheter was brought back into the 5 French base catheter
and the catheter system was disposed of as a unit. The left radial
vascular sheath was removed. Hemostasis was attained with the
assistance of a TR band.
IMPRESSION: 1. Successful trans arterial radiation segmentectomy of hepatic
segment 3.
2. Successful transarterial radiation segmentectomy of hepatic
segment 2.

## 2019-10-02 IMAGING — XA IR ANGIO/VISCERAL SELECTIVE EA VESSEL WO/W FLUSH
11 of 15 series · 12 of 24 positions shown · IV contrast (IODINE)
Comparison: none

INDICATION: 64-year-old female with alcoholic cirrhosis complicated by
multifocal hepatocellular carcinoma isolated to the lateral segments
of the left hepatic lobe. She presents today for trans arterial
radioembolization.

[Series 1: care body 4 · 1 of 16 frames shown (1 of 9)]
[frame 14/16]
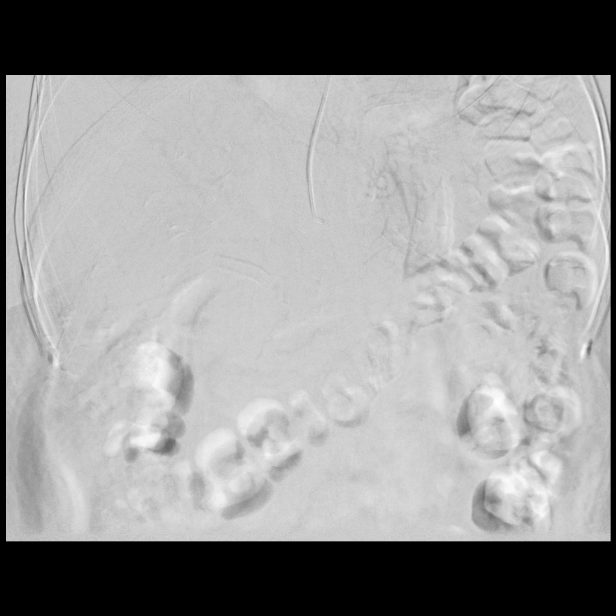

[Series 2: care body 4 · 1 of 21 frames shown (2 of 9)]
[frame 18/21]
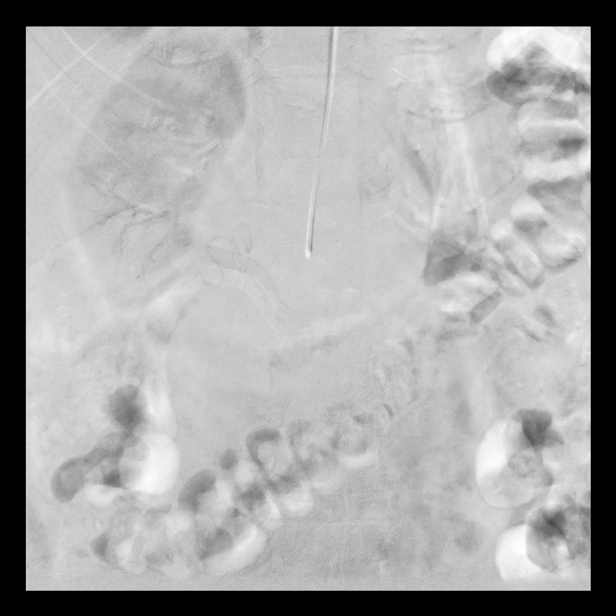

[Series 4: care body 4 · 1 of 19 frames shown (3 of 9)]
[frame 3/19]
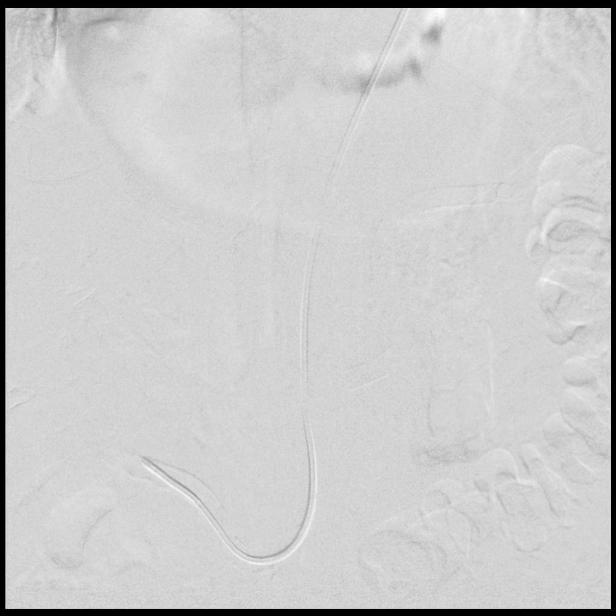

[Series 5: care body 4 · 1 of 21 frames shown (4 of 9)]
[frame 18/21]
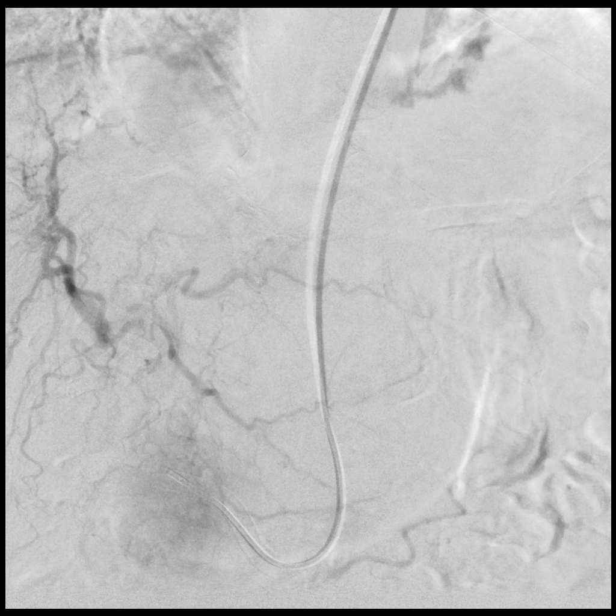

[Series 6: care body 4 · 1 of 27 frames shown (5 of 9)]
[frame 23/27]
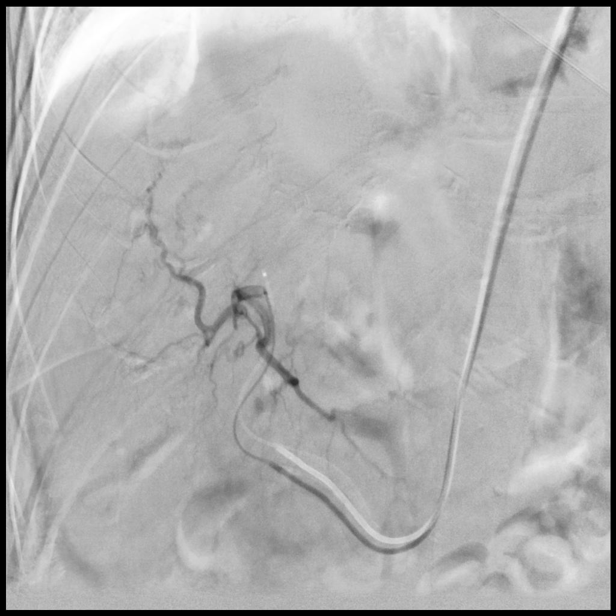

[Series 8: care body 4 · 1 of 47 frames shown (6 of 9)]
[frame 24/47]
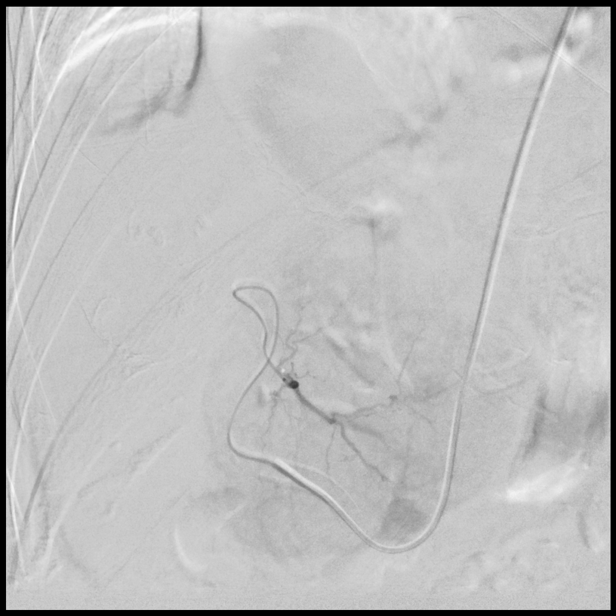

[Series 9: fl - angio · 1 of 117 frames shown]
[frame 59/117]
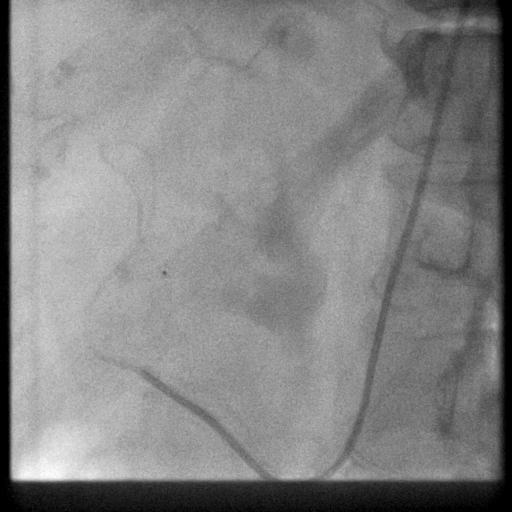

[Series 10: care body 4 · 1 of 47 frames shown (7 of 9)]
[frame 40/47]
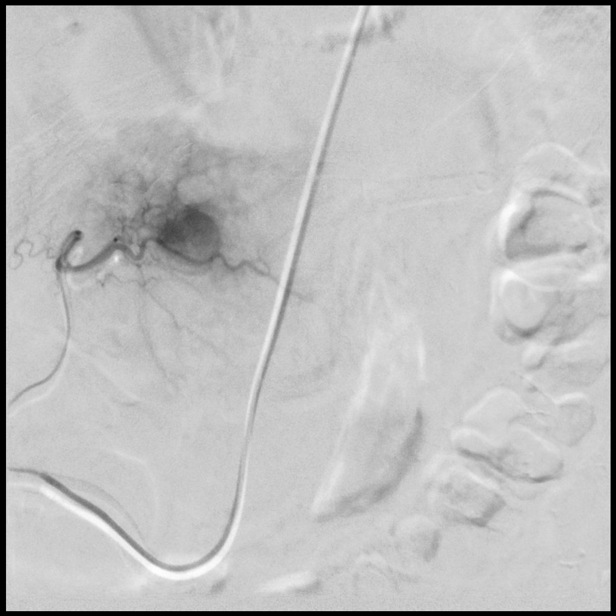

[Series 12: care body 4 · 1 of 33 frames shown (8 of 9)]
[frame 17/33]
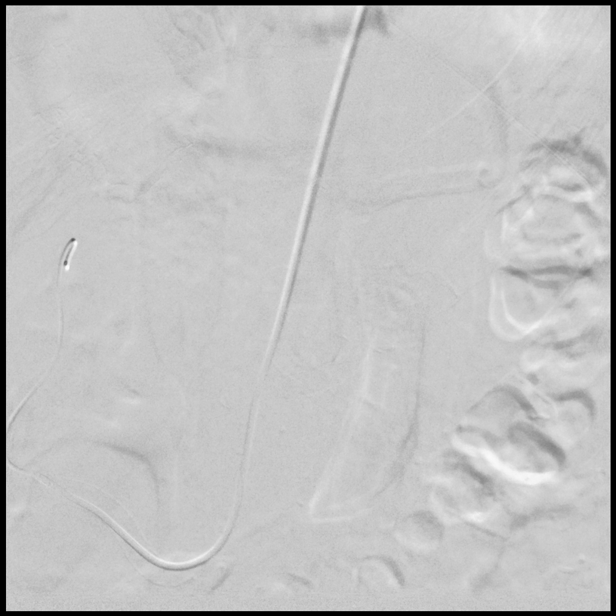

[Series 13: care body 4 · 1 of 57 frames shown (9 of 9)]
[frame 49/57]
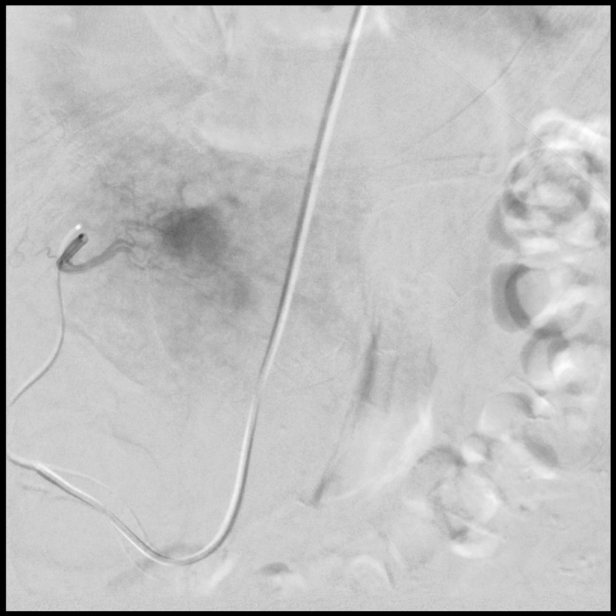

[Series 300: ld dsa body · 2 of 13 slices shown]
[im 6/13]
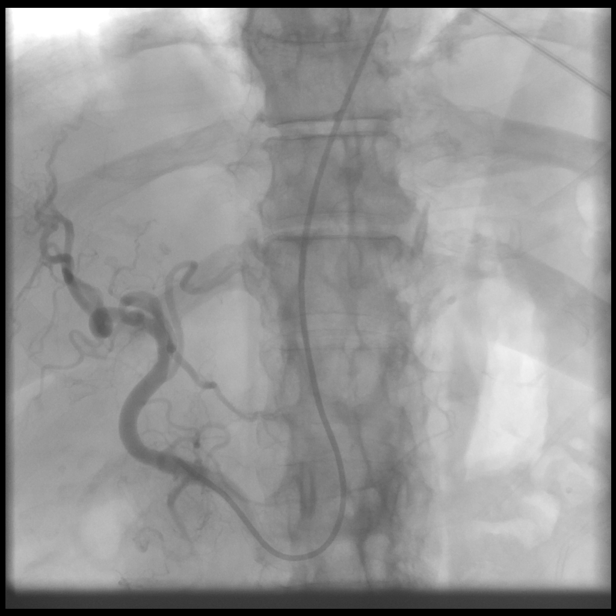
[im 13/13]
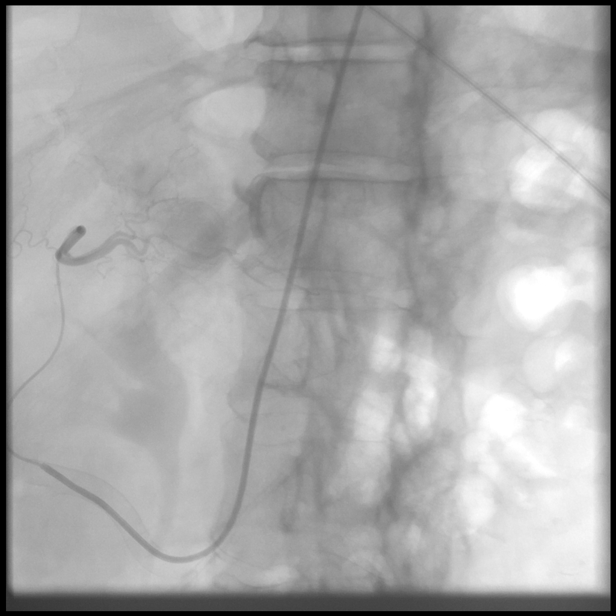

[12 of 24 positions shown; findings below may reference images not displayed]

EXAM:
IR EMBO TUMOR ORGAN ISCHEMIA INFARCT INC GUIDE ROADMAPPING;
SELECTIVE VISCERAL ARTERIOGRAPHY; ADDITIONAL ARTERIOGRAPHY; IR
ULTRASOUND GUIDANCE VASC ACCESS LEFT

1. Ultrasound-guided vascular access left radial artery
2. Catheterization of the superior mesenteric artery with
arteriogram
3. Catheterization of the common hepatic artery with arteriogram
4. Catheterization of the common trunk of the segment [DATE] hepatic
artery
5. Catheterization of the segment 3 hepatic artery
6. Trans arterial radiation segmentectomy of hepatic segment 3
7. Catheterization of the segment 2 hepatic artery
8. Trans arterial radiation segmentectomy of hepatic segment 2

MEDICATIONS:
3.375 g Zosyn. The antibiotic was administered within 1 hour of the
procedure

ANESTHESIA/SEDATION:
Moderate (conscious) sedation was employed during this procedure. A
total of Versed 2 mg and Fentanyl 100 mcg was administered
intravenously.

Moderate Sedation Time: 65 minutes. The patient's level of
consciousness and vital signs were monitored continuously by
radiology nursing throughout the procedure under my direct
supervision.

CONTRAST:  75mL OMNIPAQUE IOHEXOL 300 MG/ML SOLN, 20mL OMNIPAQUE
IOHEXOL 300 MG/ML SOLN

FLUOROSCOPY TIME:  Fluoroscopy Time: 9 minutes 24 seconds ([AQ]
mGy).

COMPLICATIONS:
None immediate.



The left radial artery was interrogated with ultrasound and found to
be widely patent and of adequate diameter. An image was obtained and
stored for the medical record. Local anesthesia was attained by
infiltration with 1% lidocaine. Under real-time sonographic
guidance, the vessel was punctured with a 21 gauge micropuncture
needle. Using standard technique, the initial micro needle was
exchanged over a 0.021 micro wire for a hydrophilic 5 French slim
arterial sheath. The sheath was flushed and then a radial cocktail
consisting of [AQ] units heparin, 2.5 mg Verapamil and 200 mcg
nitroglycerine was injected through the sheath.

An ultimate 1 catheter was advanced over a Bentson wire into the
abdominal aorta. The superior mesenteric artery was successfully
catheterized with the catheter. Arteriography was performed
identifying the origin of the replaced common hepatic artery.
Utilizing a Glidewire, the common hepatic artery was successfully
catheterized. Arteriography was then performed to identify the
origins of the segment 2 and segment [DATE] hepatic arteries.

A renegade MALLA microcatheter was introduced over a Fathom 16 wire
and advanced into the common trunk of the segment [DATE] hepatic
artery. Additional arteriography was then performed in different
obliquities in order to separate the branching pattern between
hepatic segments 3 and 4. The microcatheter was successfully
advanced beyond the origin of the segment 4 artery and into the
segment 3 artery. Arteriography was performed confirming that the
catheter is indeed within the segment 3 hepatic artery and
identifying tumor blush within hepatic segment 3. The microcatheter
was then fixed in place. Trans arterial radio embolization was then
performed utilizing resin [AGE] microspheres. This was performed in
small aliquots according to our standard protocol. Antegrade flow
was confirmed within the vessel throughout the radioembolization.
There was no stasis.

The microcatheter was then removed and appropriately disposed of. A
new Renegade high flow microcatheter was reintroduced through the 5
French base catheter and used to select the segment 2 artery. The
microcatheter was advanced deeper into the segment 2 artery and
arteriography was performed. Tumor blush was identified in hepatic
segment 2. The microcatheter was fixed in place. Trans arterial
radio embolization was then performed using our standard technique.
Antegrade flow was confirmed in the segment 2 artery throughout the
procedure. Stasis was not attained.

The microcatheter was brought back into the 5 French base catheter
and the catheter system was disposed of as a unit. The left radial
vascular sheath was removed. Hemostasis was attained with the
assistance of a TR band.
IMPRESSION: 1. Successful trans arterial radiation segmentectomy of hepatic
segment 3.
2. Successful transarterial radiation segmentectomy of hepatic
segment 2.

## 2019-10-02 IMAGING — NM NM RADIO PHARM THERAPY INTRA ARTERIAL
3 series · 18 of 18 positions shown · non-contrast
Comparison: MAA scan [DATE], MRI abdomen [DATE]

CLINICAL DATA: Hepatocellular carcinoma with several small lesions
isolated to the LEFT hepatic lobe. Radiation segmentectomy to
segments 2 and 3 of the LEFT hepatic lobe.

EXAM:
NUCLEAR MEDICINE SPECIAL MED RAD PHYSICS CONS; NUCLEAR MEDICINE
RADIO PHARM THERAPY INTRA ARTERIAL; NUCLEAR MEDICINE TREATMENT
PROCEDURE; NUCLEAR MEDICINE LIVER SCAN
TECHNIQUE: In conjunction with the interventional radiologist a Y- Microsphere
dose was calculated utilizing body surface area formulation and
partition method for radiation segmentectomy. Calculated dose equal
22.7 mCi. Pre therapy MAA liver SPECT scan and CTA were evaluated.
Utilizing a microcatheter system, the LEFT hepatic artery was
selected and Y-90 microspheres were delivered in fractionated
aliquots. The dose was split between the segment 2 and segment 3
hepatic arteries. Radiopharmaceutical was delivered by the
interventional radiologist and nuclear radiologist.
The patient tolerated procedure well. No adverse effects were noted.
Bremsstrahlung planar and SPECT imaging of the abdomen following
intrahepatic arterial delivery of Y-90 microsphere was performed.
RADIOPHARMACEUTICALS:  20.78 millicuries yttrium 90 microspheres.

[Series 1: spect - (id) _(id)_tra · 4.1mm · 4.14mm/px · 6 of 128 frames shown]
[frame 11/128]
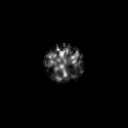
[frame 32/128]
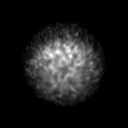
[frame 54/128]
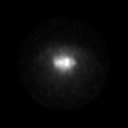
[frame 75/128]
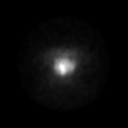
[frame 96/128]
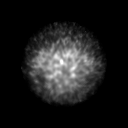
[frame 118/128]
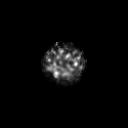

[Series 1: spect - (id) _(id)_cor · 4.1mm · 4.14mm/px · 6 of 128 frames shown]
[frame 11/128]
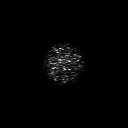
[frame 32/128]
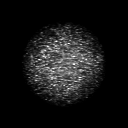
[frame 54/128]
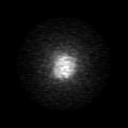
[frame 75/128]
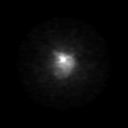
[frame 96/128]
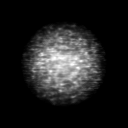
[frame 118/128]
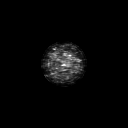

[Series 2: y-90 spect · 4.14mm/px · 6 of 64 frames shown]
[frame 6/64  full-range]
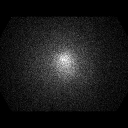
[frame 16/64  full-range]
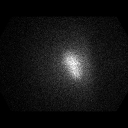
[frame 27/64  full-range]
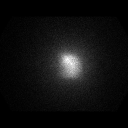
[frame 38/64  full-range]
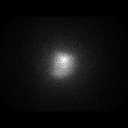
[frame 48/64  full-range]
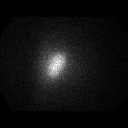
[frame 59/64  full-range]
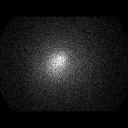

[18 of 18 positions shown; findings below may reference images not displayed]

FINDINGS: [AGE] microspheres therapy as above. First therapy the LEFT hepatic
lobe. Radiation segmentectomy to segments 2 and 3 as above.

Bremsstrahlung planar and SPECT imaging of the abdomen following
intrahepatic arterial delivery of [X9] demonstrates
radioactivity localized to the LEFT hepatic lobe. No evidence of
extrahepatic activity.
IMPRESSION: Successful [AGE] microsphere delivery for treatment of unresectable
liver metastasis. First therapy to the LEFT lobe. Intention,
radiation segmentectomy segments 2 and 3.

Bremssstrahlung scan demonstrates activity localized to LEFT hepatic
lobe with no extrahepatic activity identified.

## 2019-10-02 IMAGING — XA NM SPECIAL TREATMENT PROCEDURE
11 of 15 series · 14 of 24 positions shown · IV contrast (IODINE)
Comparison: MAA scan [DATE], MRI abdomen [DATE]

CLINICAL DATA: Hepatocellular carcinoma with several small lesions
isolated to the LEFT hepatic lobe. Radiation segmentectomy to
segments 2 and 3 of the LEFT hepatic lobe.

EXAM:
NUCLEAR MEDICINE SPECIAL MED RAD PHYSICS CONS; NUCLEAR MEDICINE
RADIO PHARM THERAPY INTRA ARTERIAL; NUCLEAR MEDICINE TREATMENT
PROCEDURE; NUCLEAR MEDICINE LIVER SCAN
TECHNIQUE: In conjunction with the interventional radiologist a Y- Microsphere
dose was calculated utilizing body surface area formulation and
partition method for radiation segmentectomy. Calculated dose equal
22.7 mCi. Pre therapy MAA liver SPECT scan and CTA were evaluated.
Utilizing a microcatheter system, the LEFT hepatic artery was
selected and Y-90 microspheres were delivered in fractionated
aliquots. The dose was split between the segment 2 and segment 3
hepatic arteries. Radiopharmaceutical was delivered by the
interventional radiologist and nuclear radiologist.
The patient tolerated procedure well. No adverse effects were noted.
Bremsstrahlung planar and SPECT imaging of the abdomen following
intrahepatic arterial delivery of Y-90 microsphere was performed.
RADIOPHARMACEUTICALS:  20.78 millicuries yttrium 90 microspheres.

[Series 1: (id) · 1 of 1 slices shown]
[im 1/1]
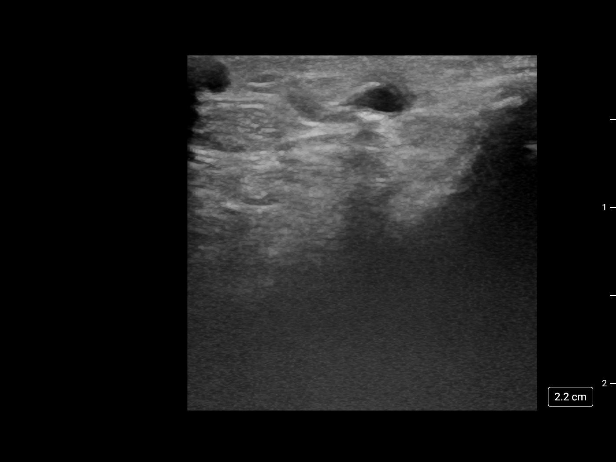

[Series 2: care body 4 · 1 of 21 frames shown (1 of 9)]
[frame 4/21]
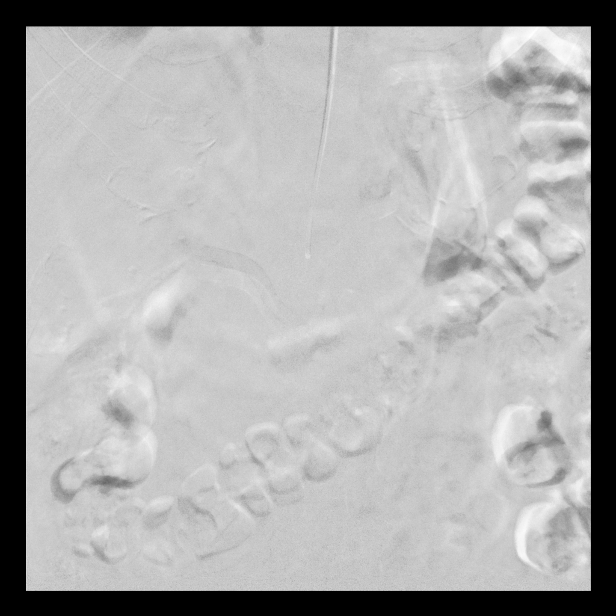

[Series 3: care body 4 · 1 of 27 frames shown (2 of 9)]
[frame 21/27]
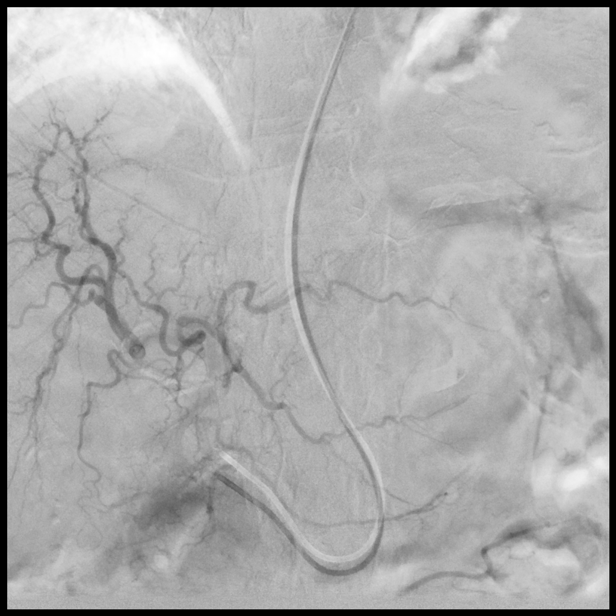

[Series 4: care body 4 · 1 of 19 frames shown (3 of 9)]
[frame 17/19]
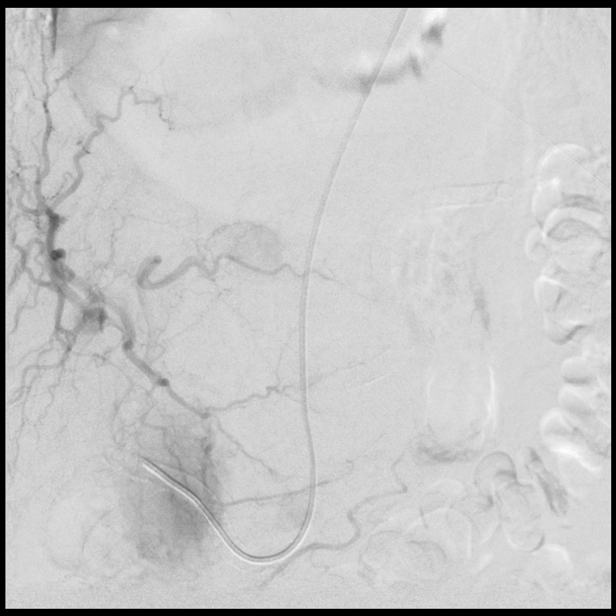

[Series 5: care body 4 · 1 of 21 frames shown (4 of 9)]
[frame 18/21]
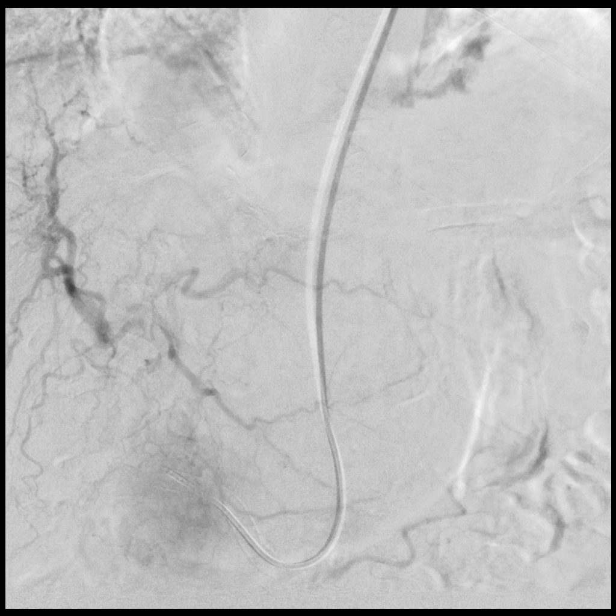

[Series 6: care body 4 · 1 of 27 frames shown (5 of 9)]
[frame 23/27]
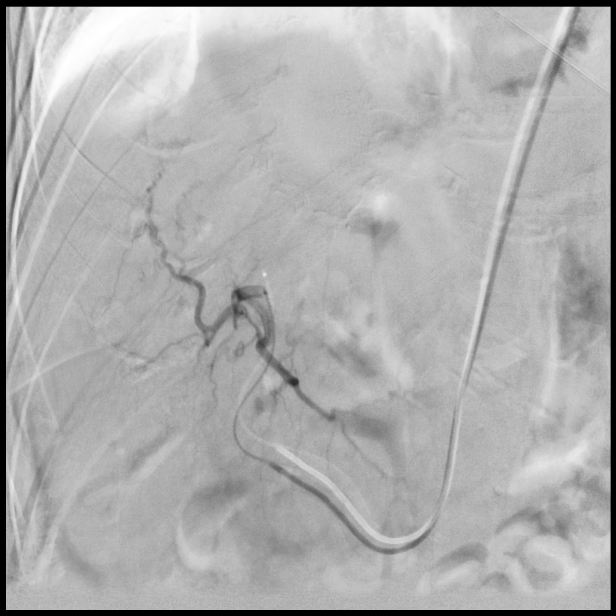

[Series 8: care body 4 · 2 of 47 frames shown (6 of 9)]
[frame 8/47]
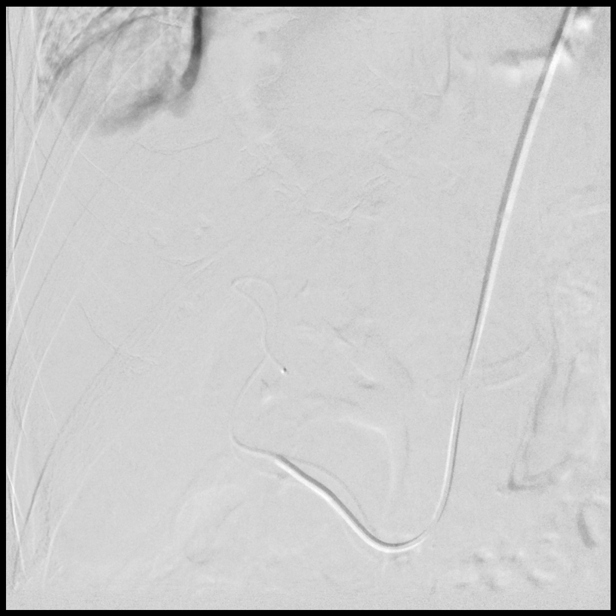
[frame 40/47]
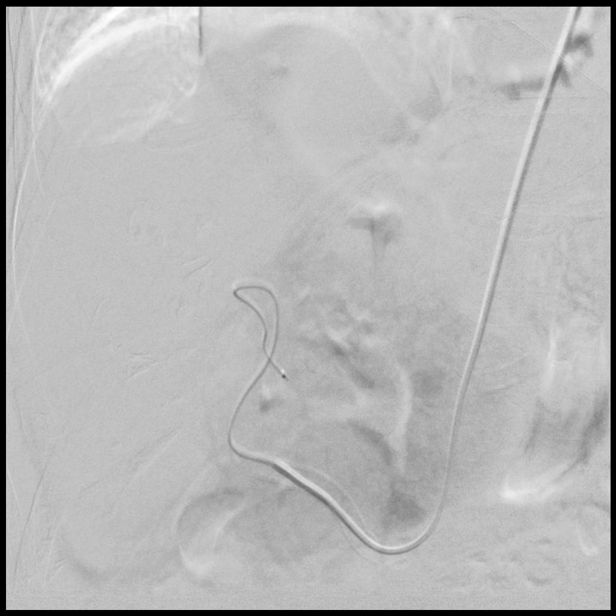

[Series 10: care body 4 · 1 of 47 frames shown (7 of 9)]
[frame 8/47]
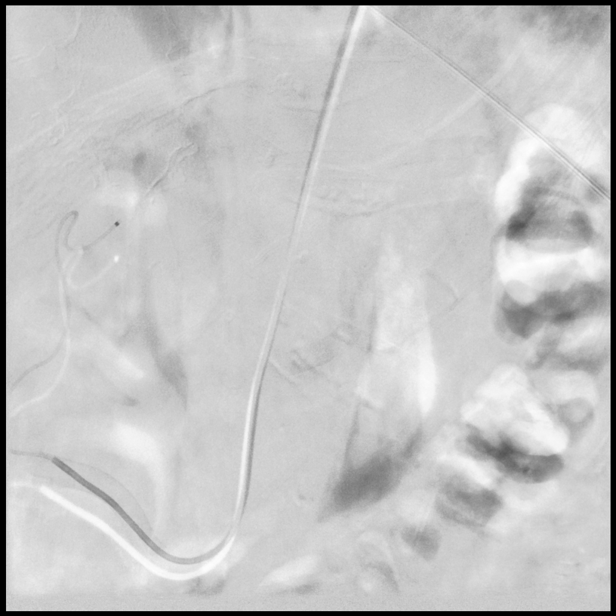

[Series 11: care body 4 · 1 of 31 frames shown (8 of 9)]
[frame 16/31]
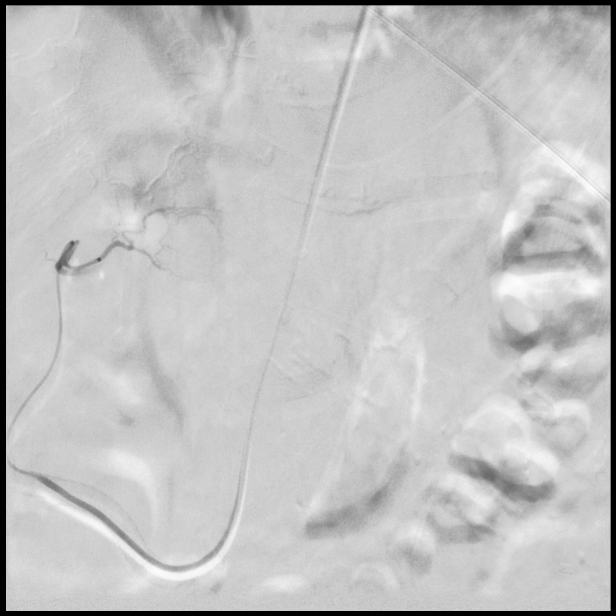

[Series 13: care body 4 · 2 of 57 frames shown (9 of 9)]
[frame 9/57]
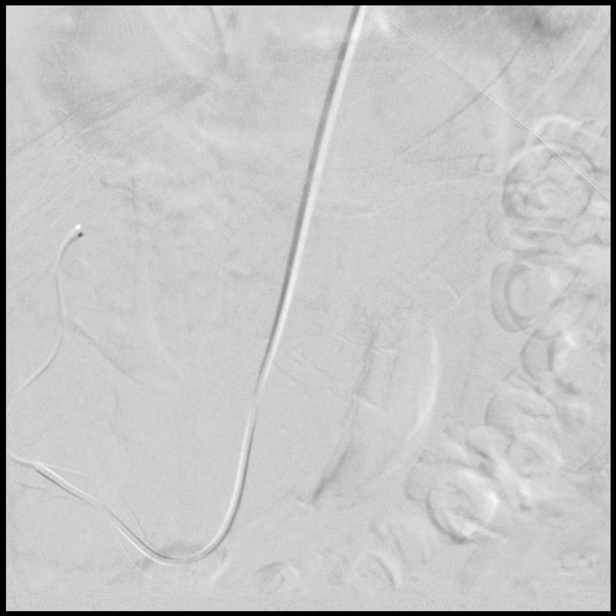
[frame 49/57]
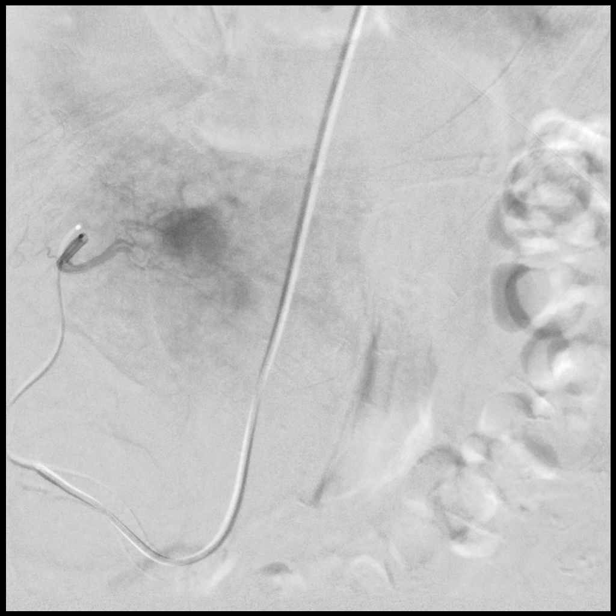

[Series 300: ld dsa body · 2 of 13 slices shown]
[im 6/13]
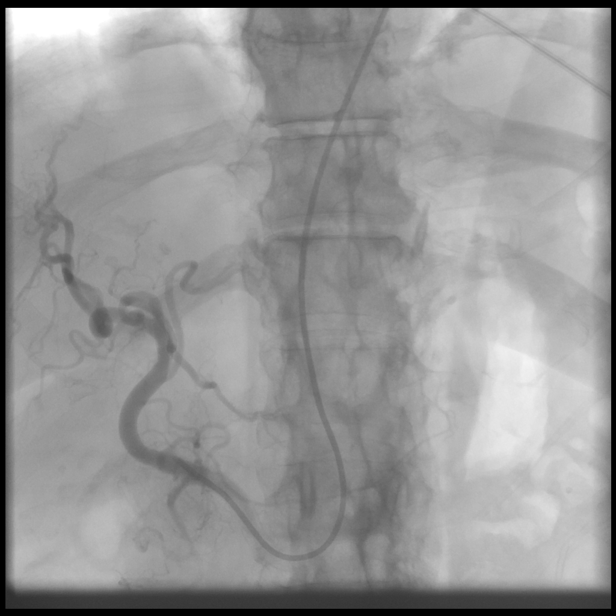
[im 13/13]
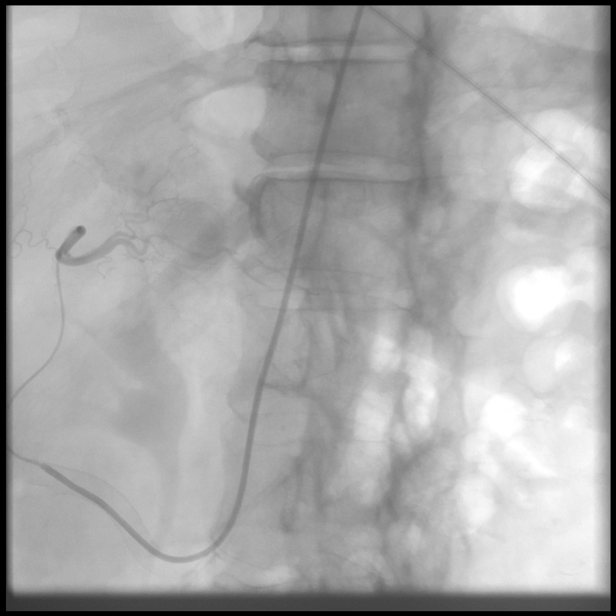

[14 of 24 positions shown; findings below may reference images not displayed]

FINDINGS: [AGE] microspheres therapy as above. First therapy the LEFT hepatic
lobe. Radiation segmentectomy to segments 2 and 3 as above.

Bremsstrahlung planar and SPECT imaging of the abdomen following
intrahepatic arterial delivery of [X9] demonstrates
radioactivity localized to the LEFT hepatic lobe. No evidence of
extrahepatic activity.
IMPRESSION: Successful [AGE] microsphere delivery for treatment of unresectable
liver metastasis. First therapy to the LEFT lobe. Intention,
radiation segmentectomy segments 2 and 3.

Bremssstrahlung scan demonstrates activity localized to LEFT hepatic
lobe with no extrahepatic activity identified.

## 2019-10-02 IMAGING — NM NM LIVER IMG SPECT
3 series · 18 of 18 positions shown · non-contrast
Comparison: MAA scan [DATE], MRI abdomen [DATE]

CLINICAL DATA: Hepatocellular carcinoma with several small lesions
isolated to the LEFT hepatic lobe. Radiation segmentectomy to
segments 2 and 3 of the LEFT hepatic lobe.

EXAM:
NUCLEAR MEDICINE SPECIAL MED RAD PHYSICS CONS; NUCLEAR MEDICINE
RADIO PHARM THERAPY INTRA ARTERIAL; NUCLEAR MEDICINE TREATMENT
PROCEDURE; NUCLEAR MEDICINE LIVER SCAN
TECHNIQUE: In conjunction with the interventional radiologist a Y- Microsphere
dose was calculated utilizing body surface area formulation and
partition method for radiation segmentectomy. Calculated dose equal
22.7 mCi. Pre therapy MAA liver SPECT scan and CTA were evaluated.
Utilizing a microcatheter system, the LEFT hepatic artery was
selected and Y-90 microspheres were delivered in fractionated
aliquots. The dose was split between the segment 2 and segment 3
hepatic arteries. Radiopharmaceutical was delivered by the
interventional radiologist and nuclear radiologist.
The patient tolerated procedure well. No adverse effects were noted.
Bremsstrahlung planar and SPECT imaging of the abdomen following
intrahepatic arterial delivery of Y-90 microsphere was performed.
RADIOPHARMACEUTICALS:  20.78 millicuries yttrium 90 microspheres.

[Series 1: spect - (id) _(id)_cor · 4.1mm · 4.14mm/px · 6 of 128 frames shown]
[frame 11/128]
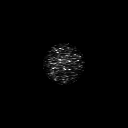
[frame 32/128]
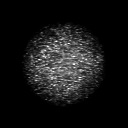
[frame 54/128]
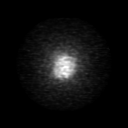
[frame 75/128]
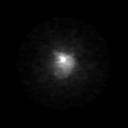
[frame 96/128]
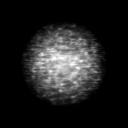
[frame 118/128]
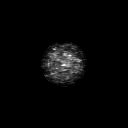

[Series 1: spect - (id) _(id)_tra · 4.1mm · 4.14mm/px · 6 of 128 frames shown]
[frame 11/128]
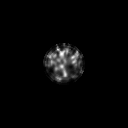
[frame 32/128]
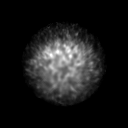
[frame 54/128]
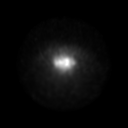
[frame 75/128]
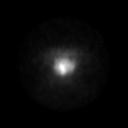
[frame 96/128]
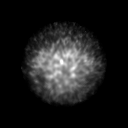
[frame 118/128]
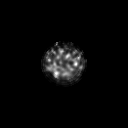

[Series 2: y-90 spect · 4.14mm/px · 6 of 64 frames shown]
[frame 6/64  full-range]
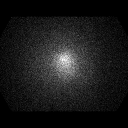
[frame 16/64  full-range]
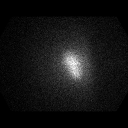
[frame 27/64  full-range]
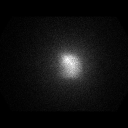
[frame 38/64  full-range]
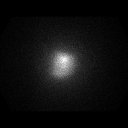
[frame 48/64  full-range]
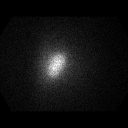
[frame 59/64  full-range]
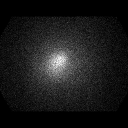

[18 of 18 positions shown; findings below may reference images not displayed]

FINDINGS: [AGE] microspheres therapy as above. First therapy the LEFT hepatic
lobe. Radiation segmentectomy to segments 2 and 3 as above.

Bremsstrahlung planar and SPECT imaging of the abdomen following
intrahepatic arterial delivery of [X9] demonstrates
radioactivity localized to the LEFT hepatic lobe. No evidence of
extrahepatic activity.
IMPRESSION: Successful [AGE] microsphere delivery for treatment of unresectable
liver metastasis. First therapy to the LEFT lobe. Intention,
radiation segmentectomy segments 2 and 3.

Bremssstrahlung scan demonstrates activity localized to LEFT hepatic
lobe with no extrahepatic activity identified.

## 2019-10-02 IMAGING — XA IR EMBO TUMOR ORGAN ISCHEMIA INFARCT INC GUIDE ROADMAPPING
11 of 15 series · 12 of 24 positions shown · IV contrast (IODINE)
Comparison: none

INDICATION: 64-year-old female with alcoholic cirrhosis complicated by
multifocal hepatocellular carcinoma isolated to the lateral segments
of the left hepatic lobe. She presents today for trans arterial
radioembolization.

[Series 1: care body 4 · 1 of 16 frames shown (1 of 9)]
[frame 14/16]
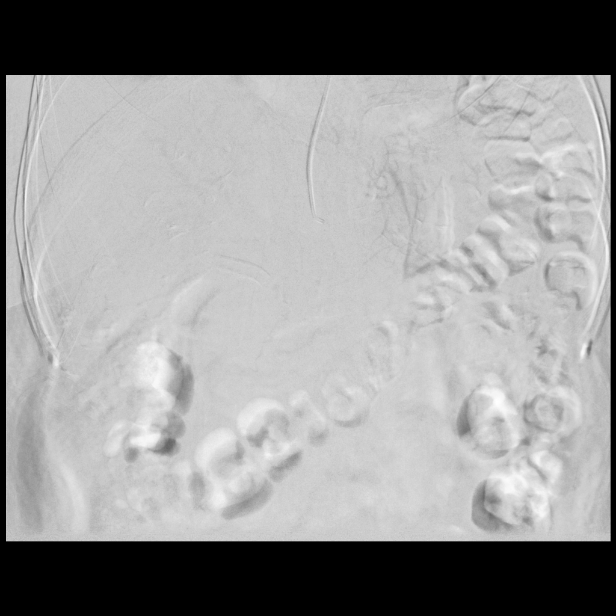

[Series 2: care body 4 · 1 of 21 frames shown (2 of 9)]
[frame 18/21]
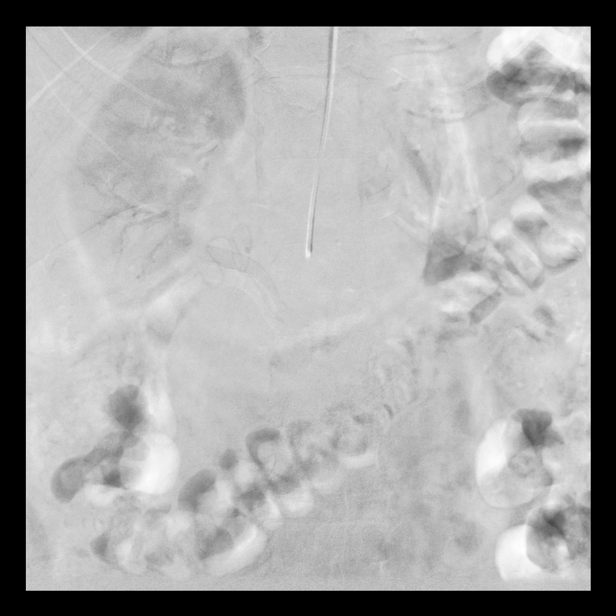

[Series 4: care body 4 · 1 of 19 frames shown (3 of 9)]
[frame 3/19]
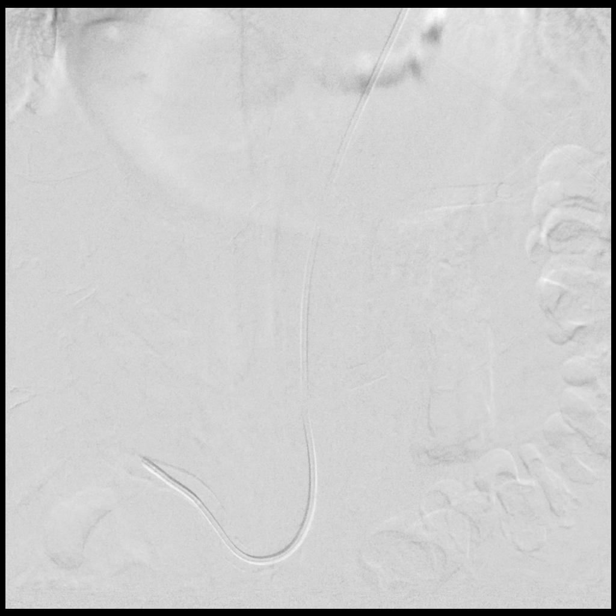

[Series 5: care body 4 · 1 of 21 frames shown (4 of 9)]
[frame 18/21]
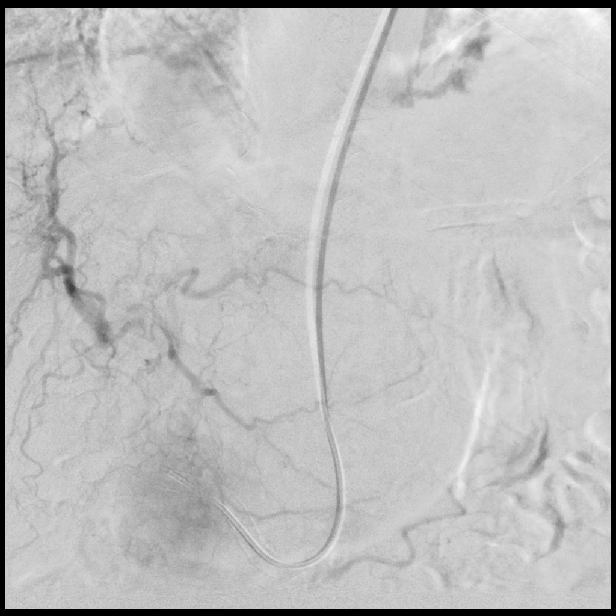

[Series 6: care body 4 · 1 of 27 frames shown (5 of 9)]
[frame 23/27]
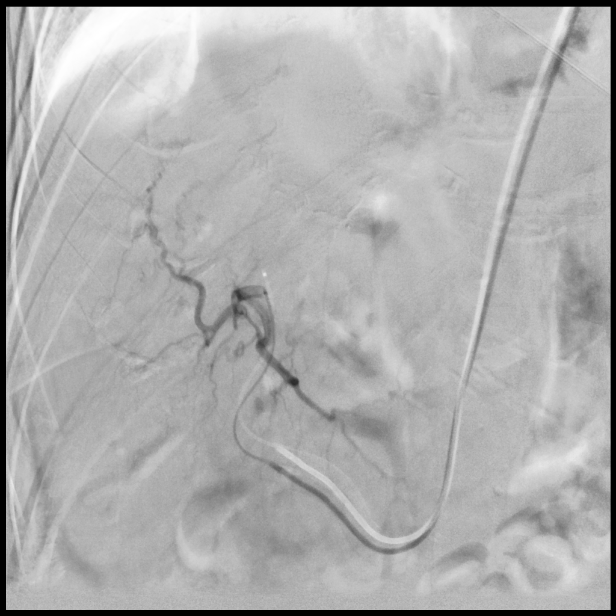

[Series 8: care body 4 · 1 of 47 frames shown (6 of 9)]
[frame 8/47]
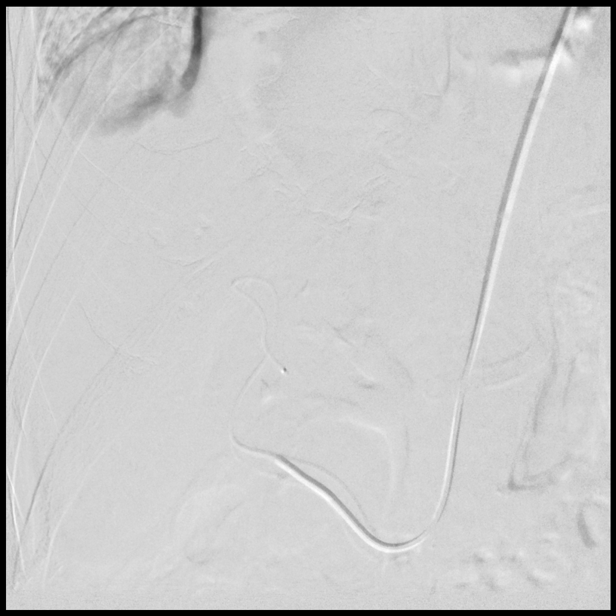

[Series 9: fl - angio · 1 of 117 frames shown]
[frame 47/117]
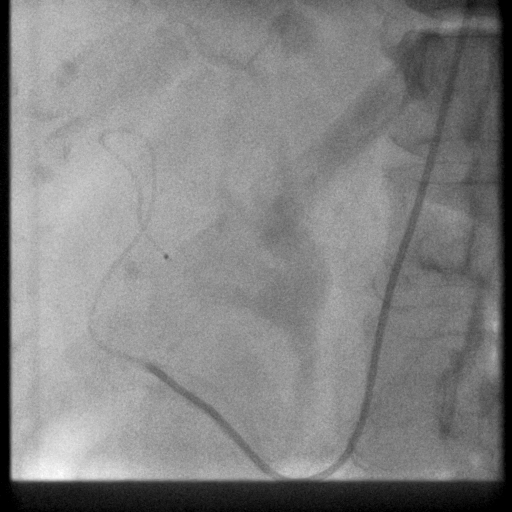

[Series 10: care body 4 · 1 of 47 frames shown (7 of 9)]
[frame 40/47]
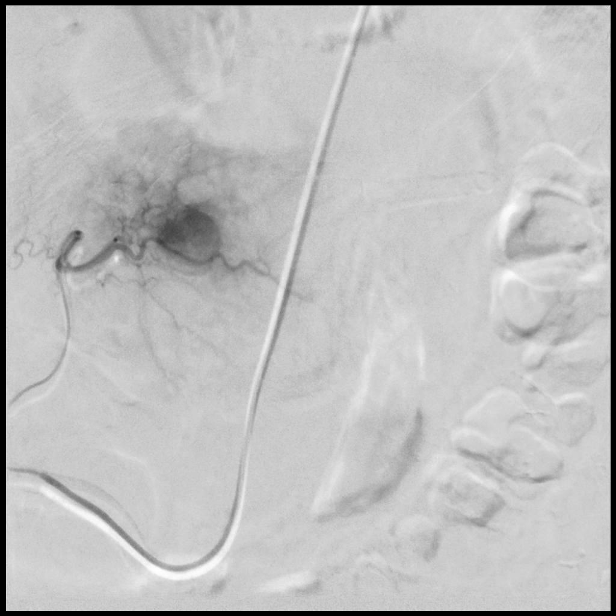

[Series 12: care body 4 · 1 of 33 frames shown (8 of 9)]
[frame 17/33]
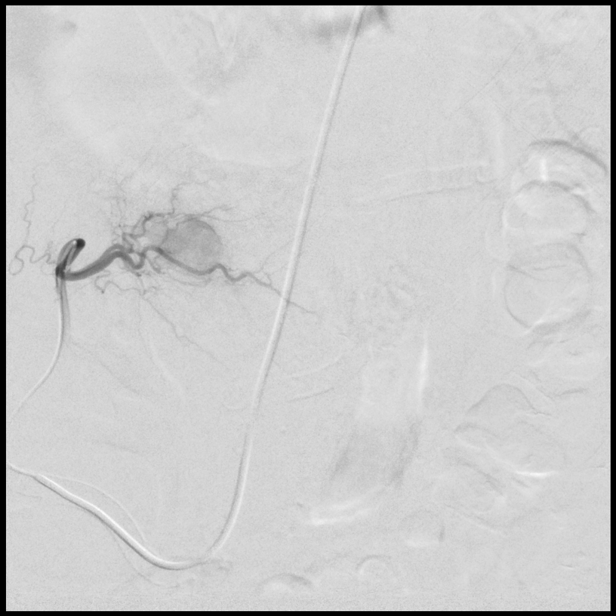

[Series 13: care body 4 · 1 of 57 frames shown (9 of 9)]
[frame 49/57]
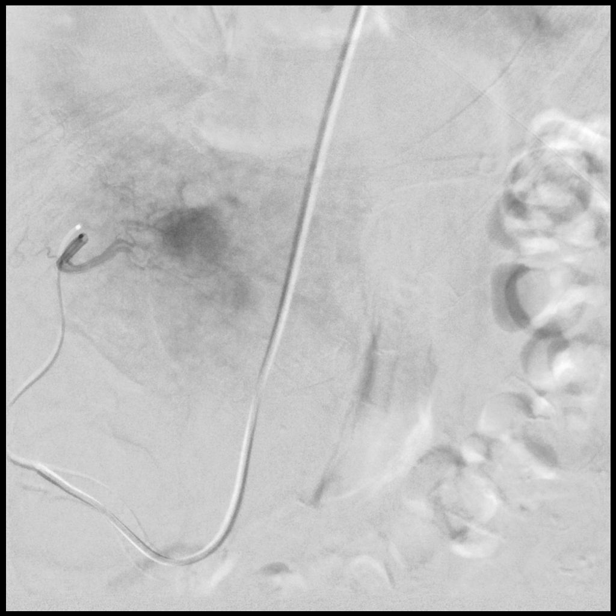

[Series 300: ld dsa body · 2 of 13 slices shown]
[im 6/13]
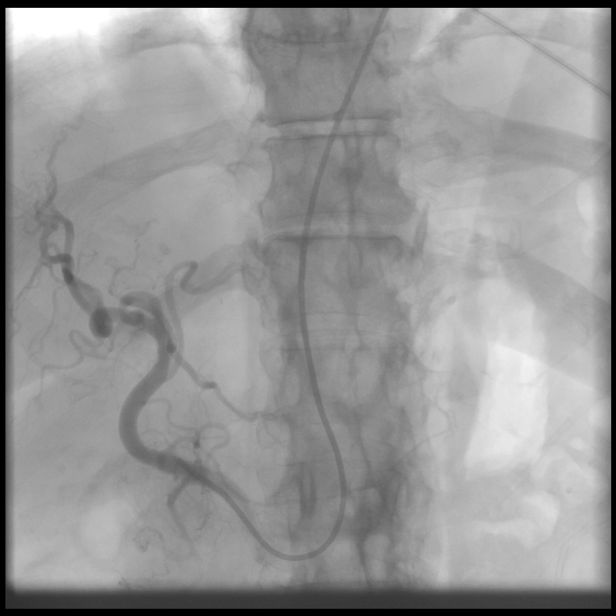
[im 13/13]
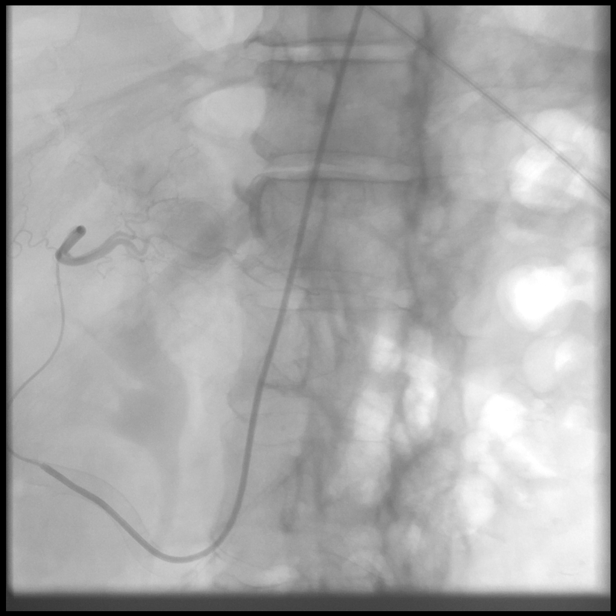

[12 of 24 positions shown; findings below may reference images not displayed]

EXAM:
IR EMBO TUMOR ORGAN ISCHEMIA INFARCT INC GUIDE ROADMAPPING;
SELECTIVE VISCERAL ARTERIOGRAPHY; ADDITIONAL ARTERIOGRAPHY; IR
ULTRASOUND GUIDANCE VASC ACCESS LEFT

1. Ultrasound-guided vascular access left radial artery
2. Catheterization of the superior mesenteric artery with
arteriogram
3. Catheterization of the common hepatic artery with arteriogram
4. Catheterization of the common trunk of the segment [DATE] hepatic
artery
5. Catheterization of the segment 3 hepatic artery
6. Trans arterial radiation segmentectomy of hepatic segment 3
7. Catheterization of the segment 2 hepatic artery
8. Trans arterial radiation segmentectomy of hepatic segment 2

MEDICATIONS:
3.375 g Zosyn. The antibiotic was administered within 1 hour of the
procedure

ANESTHESIA/SEDATION:
Moderate (conscious) sedation was employed during this procedure. A
total of Versed 2 mg and Fentanyl 100 mcg was administered
intravenously.

Moderate Sedation Time: 65 minutes. The patient's level of
consciousness and vital signs were monitored continuously by
radiology nursing throughout the procedure under my direct
supervision.

CONTRAST:  75mL OMNIPAQUE IOHEXOL 300 MG/ML SOLN, 20mL OMNIPAQUE
IOHEXOL 300 MG/ML SOLN

FLUOROSCOPY TIME:  Fluoroscopy Time: 9 minutes 24 seconds ([AQ]
mGy).

COMPLICATIONS:
None immediate.



The left radial artery was interrogated with ultrasound and found to
be widely patent and of adequate diameter. An image was obtained and
stored for the medical record. Local anesthesia was attained by
infiltration with 1% lidocaine. Under real-time sonographic
guidance, the vessel was punctured with a 21 gauge micropuncture
needle. Using standard technique, the initial micro needle was
exchanged over a 0.021 micro wire for a hydrophilic 5 French slim
arterial sheath. The sheath was flushed and then a radial cocktail
consisting of [AQ] units heparin, 2.5 mg Verapamil and 200 mcg
nitroglycerine was injected through the sheath.

An ultimate 1 catheter was advanced over a Bentson wire into the
abdominal aorta. The superior mesenteric artery was successfully
catheterized with the catheter. Arteriography was performed
identifying the origin of the replaced common hepatic artery.
Utilizing a Glidewire, the common hepatic artery was successfully
catheterized. Arteriography was then performed to identify the
origins of the segment 2 and segment [DATE] hepatic arteries.

A renegade MALLA microcatheter was introduced over a Fathom 16 wire
and advanced into the common trunk of the segment [DATE] hepatic
artery. Additional arteriography was then performed in different
obliquities in order to separate the branching pattern between
hepatic segments 3 and 4. The microcatheter was successfully
advanced beyond the origin of the segment 4 artery and into the
segment 3 artery. Arteriography was performed confirming that the
catheter is indeed within the segment 3 hepatic artery and
identifying tumor blush within hepatic segment 3. The microcatheter
was then fixed in place. Trans arterial radio embolization was then
performed utilizing resin [AGE] microspheres. This was performed in
small aliquots according to our standard protocol. Antegrade flow
was confirmed within the vessel throughout the radioembolization.
There was no stasis.

The microcatheter was then removed and appropriately disposed of. A
new Renegade high flow microcatheter was reintroduced through the 5
French base catheter and used to select the segment 2 artery. The
microcatheter was advanced deeper into the segment 2 artery and
arteriography was performed. Tumor blush was identified in hepatic
segment 2. The microcatheter was fixed in place. Trans arterial
radio embolization was then performed using our standard technique.
Antegrade flow was confirmed in the segment 2 artery throughout the
procedure. Stasis was not attained.

The microcatheter was brought back into the 5 French base catheter
and the catheter system was disposed of as a unit. The left radial
vascular sheath was removed. Hemostasis was attained with the
assistance of a TR band.
IMPRESSION: 1. Successful trans arterial radiation segmentectomy of hepatic
segment 3.
2. Successful transarterial radiation segmentectomy of hepatic
segment 2.

## 2019-10-02 IMAGING — XA IR EMBO TUMOR ORGAN ISCHEMIA INFARCT INC GUIDE ROADMAPPING
11 of 15 series · 12 of 24 positions shown · IV contrast (IODINE)
Comparison: none

INDICATION: 64-year-old female with alcoholic cirrhosis complicated by
multifocal hepatocellular carcinoma isolated to the lateral segments
of the left hepatic lobe. She presents today for trans arterial
radioembolization.

[Series 1: care body 4 · 1 of 16 frames shown (1 of 9)]
[frame 14/16]
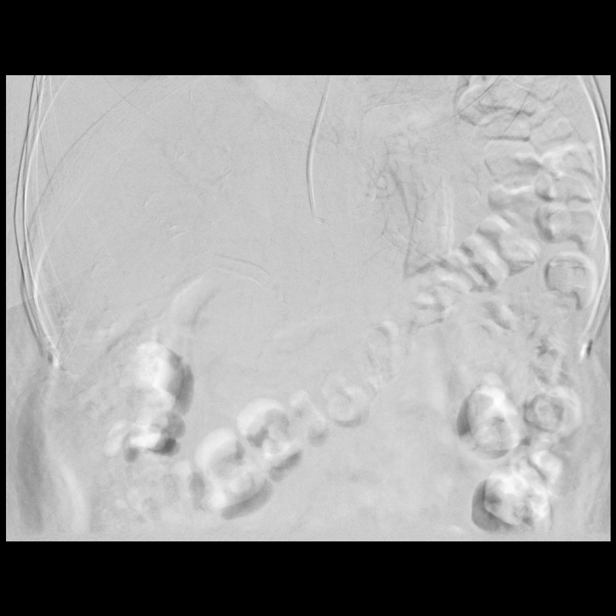

[Series 2: care body 4 · 1 of 21 frames shown (2 of 9)]
[frame 18/21]
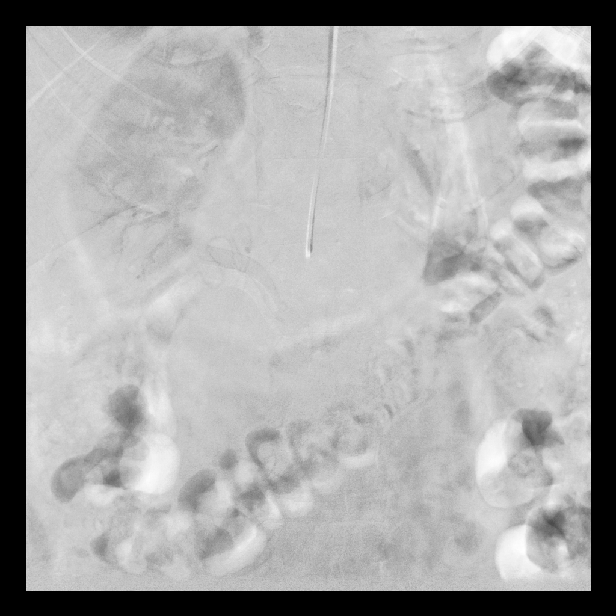

[Series 4: care body 4 · 1 of 19 frames shown (3 of 9)]
[frame 3/19]
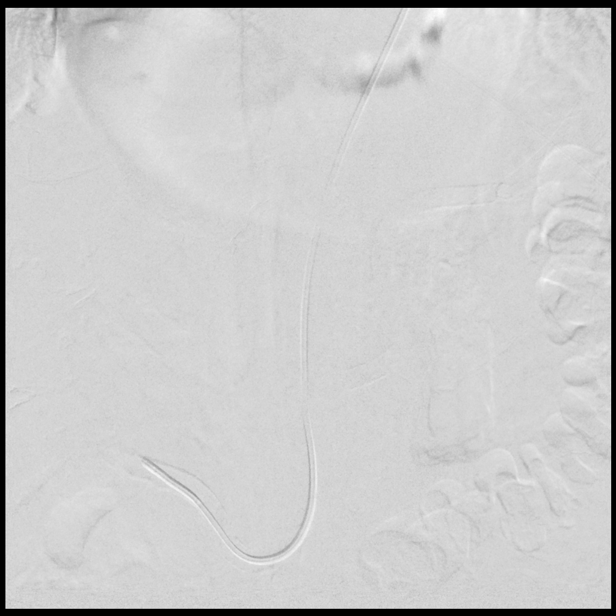

[Series 5: care body 4 · 1 of 21 frames shown (4 of 9)]
[frame 18/21]
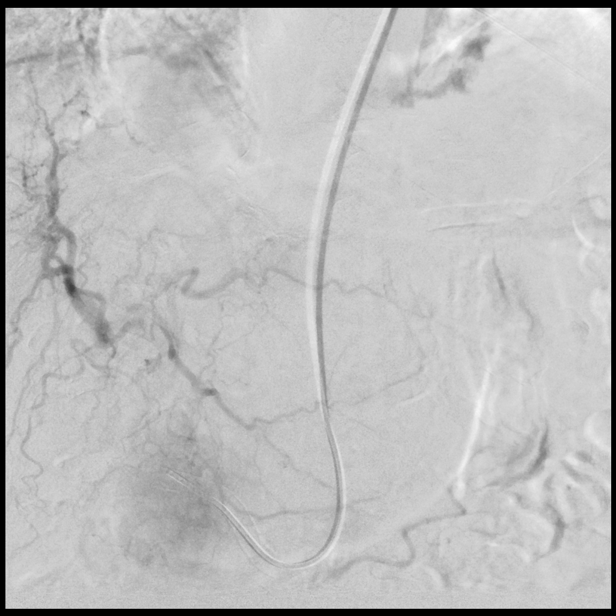

[Series 6: care body 4 · 1 of 27 frames shown (5 of 9)]
[frame 23/27]
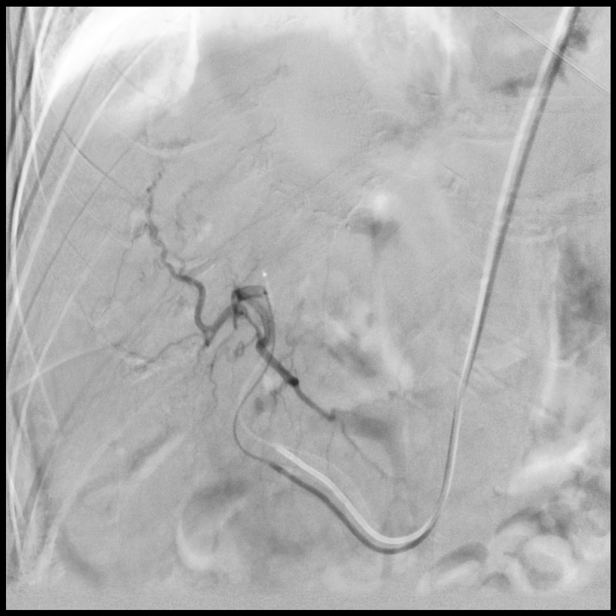

[Series 8: care body 4 · 1 of 47 frames shown (6 of 9)]
[frame 8/47]
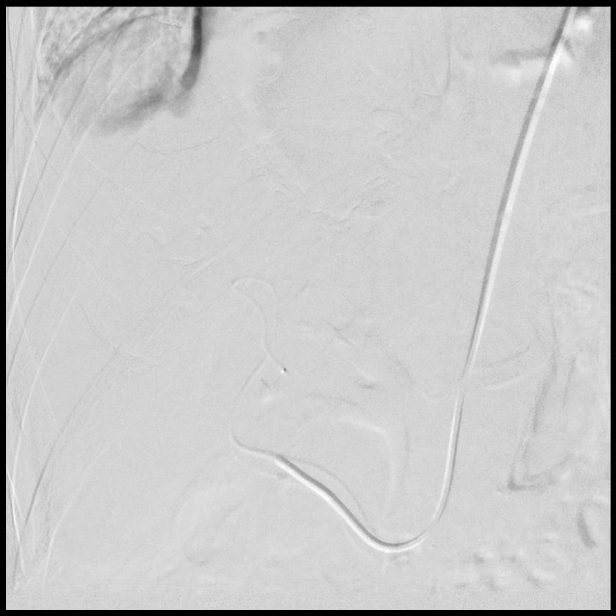

[Series 9: fl - angio · 1 of 117 frames shown]
[frame 47/117]
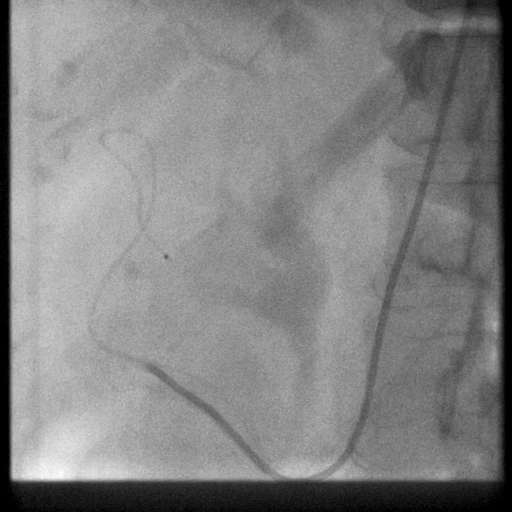

[Series 10: care body 4 · 1 of 47 frames shown (7 of 9)]
[frame 40/47]
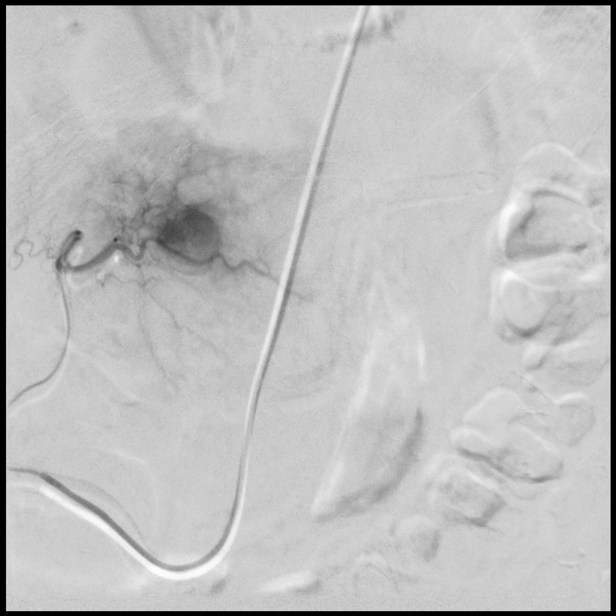

[Series 12: care body 4 · 1 of 33 frames shown (8 of 9)]
[frame 17/33]
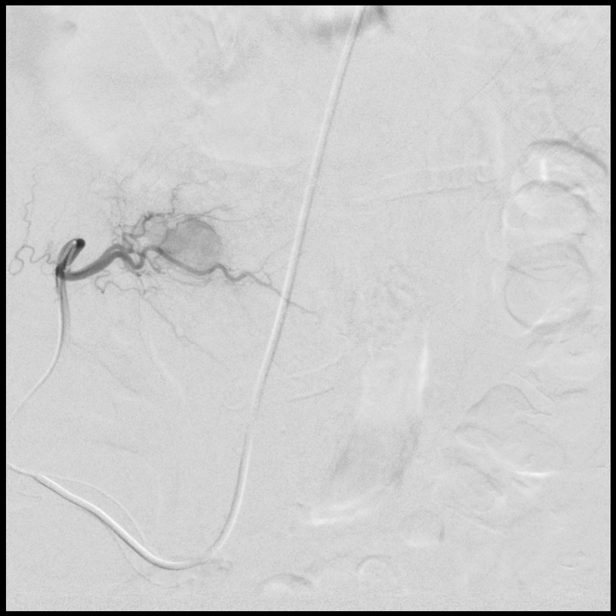

[Series 13: care body 4 · 1 of 57 frames shown (9 of 9)]
[frame 49/57]
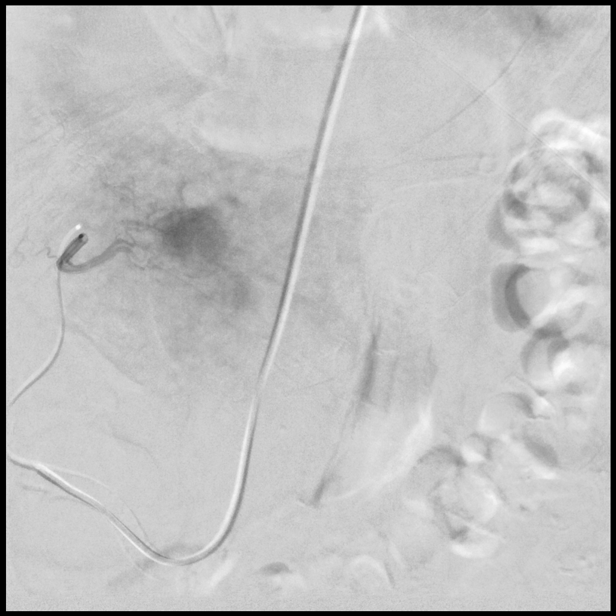

[Series 300: ld dsa body · 2 of 13 slices shown]
[im 6/13]
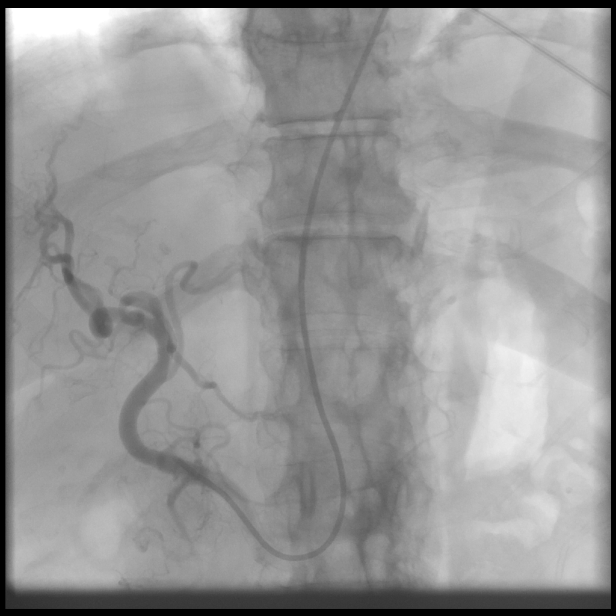
[im 13/13]
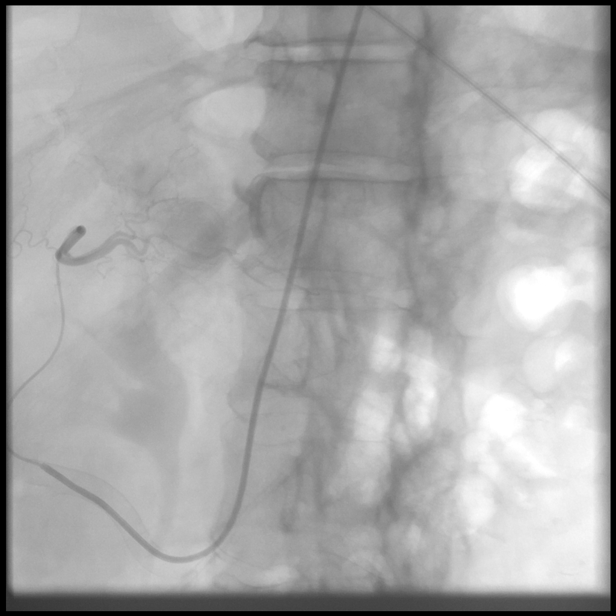

[12 of 24 positions shown; findings below may reference images not displayed]

EXAM:
IR EMBO TUMOR ORGAN ISCHEMIA INFARCT INC GUIDE ROADMAPPING;
SELECTIVE VISCERAL ARTERIOGRAPHY; ADDITIONAL ARTERIOGRAPHY; IR
ULTRASOUND GUIDANCE VASC ACCESS LEFT

1. Ultrasound-guided vascular access left radial artery
2. Catheterization of the superior mesenteric artery with
arteriogram
3. Catheterization of the common hepatic artery with arteriogram
4. Catheterization of the common trunk of the segment [DATE] hepatic
artery
5. Catheterization of the segment 3 hepatic artery
6. Trans arterial radiation segmentectomy of hepatic segment 3
7. Catheterization of the segment 2 hepatic artery
8. Trans arterial radiation segmentectomy of hepatic segment 2

MEDICATIONS:
3.375 g Zosyn. The antibiotic was administered within 1 hour of the
procedure

ANESTHESIA/SEDATION:
Moderate (conscious) sedation was employed during this procedure. A
total of Versed 2 mg and Fentanyl 100 mcg was administered
intravenously.

Moderate Sedation Time: 65 minutes. The patient's level of
consciousness and vital signs were monitored continuously by
radiology nursing throughout the procedure under my direct
supervision.

CONTRAST:  75mL OMNIPAQUE IOHEXOL 300 MG/ML SOLN, 20mL OMNIPAQUE
IOHEXOL 300 MG/ML SOLN

FLUOROSCOPY TIME:  Fluoroscopy Time: 9 minutes 24 seconds ([AQ]
mGy).

COMPLICATIONS:
None immediate.



The left radial artery was interrogated with ultrasound and found to
be widely patent and of adequate diameter. An image was obtained and
stored for the medical record. Local anesthesia was attained by
infiltration with 1% lidocaine. Under real-time sonographic
guidance, the vessel was punctured with a 21 gauge micropuncture
needle. Using standard technique, the initial micro needle was
exchanged over a 0.021 micro wire for a hydrophilic 5 French slim
arterial sheath. The sheath was flushed and then a radial cocktail
consisting of [AQ] units heparin, 2.5 mg Verapamil and 200 mcg
nitroglycerine was injected through the sheath.

An ultimate 1 catheter was advanced over a Bentson wire into the
abdominal aorta. The superior mesenteric artery was successfully
catheterized with the catheter. Arteriography was performed
identifying the origin of the replaced common hepatic artery.
Utilizing a Glidewire, the common hepatic artery was successfully
catheterized. Arteriography was then performed to identify the
origins of the segment 2 and segment [DATE] hepatic arteries.

A renegade MALLA microcatheter was introduced over a Fathom 16 wire
and advanced into the common trunk of the segment [DATE] hepatic
artery. Additional arteriography was then performed in different
obliquities in order to separate the branching pattern between
hepatic segments 3 and 4. The microcatheter was successfully
advanced beyond the origin of the segment 4 artery and into the
segment 3 artery. Arteriography was performed confirming that the
catheter is indeed within the segment 3 hepatic artery and
identifying tumor blush within hepatic segment 3. The microcatheter
was then fixed in place. Trans arterial radio embolization was then
performed utilizing resin [AGE] microspheres. This was performed in
small aliquots according to our standard protocol. Antegrade flow
was confirmed within the vessel throughout the radioembolization.
There was no stasis.

The microcatheter was then removed and appropriately disposed of. A
new Renegade high flow microcatheter was reintroduced through the 5
French base catheter and used to select the segment 2 artery. The
microcatheter was advanced deeper into the segment 2 artery and
arteriography was performed. Tumor blush was identified in hepatic
segment 2. The microcatheter was fixed in place. Trans arterial
radio embolization was then performed using our standard technique.
Antegrade flow was confirmed in the segment 2 artery throughout the
procedure. Stasis was not attained.

The microcatheter was brought back into the 5 French base catheter
and the catheter system was disposed of as a unit. The left radial
vascular sheath was removed. Hemostasis was attained with the
assistance of a TR band.
IMPRESSION: 1. Successful trans arterial radiation segmentectomy of hepatic
segment 3.
2. Successful transarterial radiation segmentectomy of hepatic
segment 2.

## 2019-10-02 MED ORDER — SODIUM CHLORIDE 0.9 % IV SOLN: INTRAVENOUS | Status: DC

## 2019-10-02 MED ORDER — LIDOCAINE HCL (PF) 1 % IJ SOLN
INTRAMUSCULAR | Status: AC
  Filled 2019-10-02: qty 30

## 2019-10-02 MED ORDER — SODIUM CHLORIDE 0.9 % IV SOLN
8.0000 mg | Freq: Once | INTRAVENOUS | Status: AC
  Administered 2019-10-02: 8 mg via INTRAVENOUS
  Filled 2019-10-02: qty 4

## 2019-10-02 MED ORDER — IOHEXOL 300 MG/ML  SOLN
100.0000 mL | Freq: Once | INTRAMUSCULAR | Status: AC | PRN
  Administered 2019-10-02: 75 mL via INTRA_ARTERIAL

## 2019-10-02 MED ORDER — YTTRIUM 90 INJECTION
20.7800 | INJECTION | Freq: Once | INTRAVENOUS | Status: AC | PRN
  Administered 2019-10-02: 20.78 via INTRA_ARTERIAL

## 2019-10-02 MED ORDER — HEPARIN SODIUM (PORCINE) 1000 UNIT/ML IJ SOLN
INTRAMUSCULAR | Status: AC
  Filled 2019-10-02: qty 1

## 2019-10-02 MED ORDER — LIDOCAINE HCL 1 % IJ SOLN
INTRAMUSCULAR | Status: AC
  Filled 2019-10-02: qty 20

## 2019-10-02 MED ORDER — PIPERACILLIN-TAZOBACTAM 3.375 G IVPB
3.3750 g | Freq: Once | INTRAVENOUS | Status: AC
  Administered 2019-10-02: 3.375 g via INTRAVENOUS

## 2019-10-02 MED ORDER — MIDAZOLAM HCL 2 MG/2ML IJ SOLN
INTRAMUSCULAR | Status: AC | PRN
  Administered 2019-10-02 (×2): 1 mg via INTRAVENOUS

## 2019-10-02 MED ORDER — LIDOCAINE HCL (PF) 1 % IJ SOLN
INTRAMUSCULAR | Status: AC | PRN
  Administered 2019-10-02: 2 mL via INTRADERMAL

## 2019-10-02 MED ORDER — PIPERACILLIN-TAZOBACTAM 3.375 G IVPB
INTRAVENOUS | Status: AC
  Filled 2019-10-02: qty 50

## 2019-10-02 MED ORDER — FENTANYL CITRATE (PF) 100 MCG/2ML IJ SOLN
INTRAMUSCULAR | Status: AC | PRN
  Administered 2019-10-02 (×2): 50 ug via INTRAVENOUS

## 2019-10-02 MED ORDER — DEXAMETHASONE SODIUM PHOSPHATE 10 MG/ML IJ SOLN
8.0000 mg | Freq: Once | INTRAMUSCULAR | Status: AC
  Administered 2019-10-02: 8 mg via INTRAVENOUS

## 2019-10-02 MED ORDER — DEXAMETHASONE SODIUM PHOSPHATE 10 MG/ML IJ SOLN
INTRAMUSCULAR | Status: AC
  Filled 2019-10-02: qty 1

## 2019-10-02 MED ORDER — NITROGLYCERIN IN D5W 100-5 MCG/ML-% IV SOLN
INTRAVENOUS | Status: AC
  Filled 2019-10-02: qty 250

## 2019-10-02 MED ORDER — FENTANYL CITRATE (PF) 100 MCG/2ML IJ SOLN
INTRAMUSCULAR | Status: AC
  Filled 2019-10-02: qty 2

## 2019-10-02 MED ORDER — PANTOPRAZOLE SODIUM 40 MG IV SOLR
INTRAVENOUS | Status: AC
  Filled 2019-10-02: qty 40

## 2019-10-02 MED ORDER — VERAPAMIL HCL 2.5 MG/ML IV SOLN: INTRA_ARTERIAL | Status: AC | PRN

## 2019-10-02 MED ORDER — MIDAZOLAM HCL 2 MG/2ML IJ SOLN
INTRAMUSCULAR | Status: AC
  Filled 2019-10-02: qty 2

## 2019-10-02 MED ORDER — VERAPAMIL HCL 2.5 MG/ML IV SOLN
INTRAVENOUS | Status: AC
  Filled 2019-10-02: qty 2

## 2019-10-02 MED ORDER — IOHEXOL 300 MG/ML  SOLN
100.0000 mL | Freq: Once | INTRAMUSCULAR | Status: AC | PRN
  Administered 2019-10-02: 20 mL via INTRA_ARTERIAL

## 2019-10-02 MED ORDER — PANTOPRAZOLE SODIUM 40 MG IV SOLR
40.0000 mg | Freq: Once | INTRAVENOUS | Status: AC
  Administered 2019-10-02: 40 mg via INTRAVENOUS
  Filled 2019-10-02: qty 40

## 2019-10-02 NOTE — Discharge Instructions (Signed)
Angiogram, Care After This sheet gives you information about how to care for yourself after your procedure. Your doctor may also give you more specific instructions. If you have problems or questions, contact your doctor. Follow these instructions at home: Insertion site care  Follow instructions from your doctor about how to take care of your long, thin tube (catheter) insertion area. Make sure you: ? Wash your hands with soap and water before you change your bandage (dressing). If you cannot use soap and water, use hand sanitizer. ? Change your bandage as told by your doctor.  Do not take baths, swim, or use a hot tub until your doctor says it is okay.  You may shower 24 hours after the procedure  ? Gently wash the area with plain soap and water. ? Pat the area dry with a clean towel. ? Do not rub the area. This may cause bleeding.  Do not apply powder or lotion to the area. Keep the area clean and dry.  Check your insertion area every day for signs of infection. Check for: ? More redness, swelling, or pain. ? Fluid or blood. ? Warmth. ? Pus or a bad smell. Activity  Rest as told by your doctor, usually for 1-2 days.  Do not lift anything that is heavier than 10 lbs. (4.5 kg) or as told by your doctor.  Do not drive for 24 hours because you were given a medicine to help you relax (sedative).  Do not drive or use heavy machinery for 24 hours. General instructions   Go back to your normal activities as told by your doctor, usually in about a week. Ask your doctor what activities are safe for you.  If the insertion area starts to bleed, lie flat and put pressure on the area. If the bleeding does not stop, get help right away. This is an emergency.  Drink enough fluid to keep your pee (urine) clear or pale yellow.  Take over-the-counter and prescription medicines only as told by your doctor.  Keep all follow-up visits as told by your doctor. This is important. Contact a  doctor if:  You have a fever.  You have chills.  You have more redness, swelling, or pain around your insertion area.  You have fluid or blood coming from your insertion area.  The insertion area feels warm to the touch.  You have pus or a bad smell coming from your insertion area.  You have more bruising around the insertion area.  Blood collects in the tissue around the insertion area (hematoma) that may be painful to the touch. Get help right away if:  You have a lot of pain in the insertion area.  The insertion area swells very fast.  The insertion area is bleeding, and the bleeding does not stop after holding steady pressure on the area.  The area near or just beyond the insertion area becomes pale, cool, tingly, or numb. These symptoms may be an emergency. Do not wait to see if the symptoms will go away. Get medical help right away. Call your local emergency services (911 in the [place] Do not drive yourself to the hospital. Summary  After the procedure, it is common to have bruising and tenderness at the long, thin tube insertion area.  After the procedure, it is important to rest and drink plenty of fluids.  Do not take baths, swim, or use a hot tub until your doctor says it is okay to do so. You may shower 24  hours after the procedure or as told by your doctor.  If the insertion area starts to bleed, lie flat and put pressure on the area. If the bleeding does not stop, get help right away. This is an emergency. This information is not intended to replace advice given to you by your health care provider. Make sure you discuss any questions you have with your health care provider. Document Revised: 03/18/2017 Document Reviewed: 03/30/2016 Elsevier Patient Education  2020 [employer].

## 2019-10-02 NOTE — Sedation Documentation (Signed)
TR band applied with 10cc instilled by Dr Laurence Ferrari.

## 2019-10-02 NOTE — H&P (Signed)
Referring Physician(s): Yu,Z  Supervising Physician: Jacqulynn Cadet  Patient Status:  [facility]  Chief Complaint: Hepatocellular carcinoma   Subjective: Patient familiar to IR service from consultation with Dr. Laurence Ferrari on 08/28/2019 to discuss treatment options for newly diagnosed hepatocellular carcinoma.  She has a history of alcoholic cirrhosis now complicated by 2 small enhancing lesions in the left liver (segment 2 and segment 3) with imaging features diagnostic of hepatocellular carcinoma.  There is also a third arterially enhancing lesion in the left hepatic lobe likely representing a third focus of [place].  She was deemed an appropriate candidate for Y 90 hepatic radioembolization and presents today for the procedure.  She currently denies fever, headache, chest pain, dyspnea, abdominal pain, back pain, nausea, vomiting or bleeding.  She does have occasional cough.  Additional medical history as below. Past Medical History:  Diagnosis Date  . B12 deficiency 04/05/2017  . Blood in stool   . Cerebral aneurysm   . Chronic kidney disease   . Diabetes mellitus without complication ([place])   . Gastric erosion with bleeding   . Hyperlipidemia   . Hypertension   . Lower extremity cellulitis 02/08/2019  . Seizure disorder [facility])    Past Surgical History:  Procedure Laterality Date  . CEREBRAL ANEURYSM REPAIR  1991  . COLONOSCOPY WITH PROPOFOL N/A 02/12/2019   Procedure: COLONOSCOPY WITH PROPOFOL;  Surgeon: Lin Landsman, MD;  Location: [facility] ENDOSCOPY;  Service: Gastroenterology;  Laterality: N/A;  . ESOPHAGOGASTRODUODENOSCOPY (EGD) WITH PROPOFOL N/A 02/12/2019   Procedure: ESOPHAGOGASTRODUODENOSCOPY (EGD) WITH PROPOFOL;  Surgeon: Lin Landsman, MD;  Location: [facility] ENDOSCOPY;  Service: Gastroenterology;  Laterality: N/A;  . IR ANGIOGRAM SELECTIVE EACH ADDITIONAL VESSEL  09/21/2019  . IR ANGIOGRAM SELECTIVE EACH ADDITIONAL VESSEL  09/21/2019  . IR ANGIOGRAM SELECTIVE EACH  ADDITIONAL VESSEL  09/21/2019  . IR ANGIOGRAM SELECTIVE EACH ADDITIONAL VESSEL  09/21/2019  . IR ANGIOGRAM VISCERAL SELECTIVE  09/21/2019  . IR ANGIOGRAM VISCERAL SELECTIVE  09/21/2019  . IR RADIOLOGIST EVAL & MGMT  08/28/2019  . IR US GUIDE VASC ACCESS LEFT  09/21/2019      Allergies: Aspirin  Medications: Prior to Admission medications   Medication Sig Start Date End Date Taking? Authorizing Provider  allopurinol (ZYLOPRIM) 100 MG tablet Take 1 tablet (100 mg total) by mouth daily. 08/02/19   Kendell Bane, NP  apixaban (ELIQUIS) 5 MG TABS tablet Take 1 tablet (5 mg total) by mouth 2 (two) times daily. 06/12/19   Loel Dubonnet, NP  diltiazem (CARTIA XT) 240 MG 24 hr capsule Take 1 capsule (240 mg total) by mouth daily. 07/26/19 07/25/20  Vashti Hey, MD  escitalopram (LEXAPRO) 10 MG tablet Take 1 tablet (10 mg total) by mouth daily. For anxiety/sleep 03/20/19   Lavera Guise, MD  furosemide (LASIX) 20 MG tablet Take 1 tablet (20 mg total) by mouth daily. 09/21/19   Lavera Guise, MD  glimepiride (AMARYL) 1 MG tablet Take 1 tablet (1 mg total) by mouth every morning. 08/02/19 08/01/20  Kendell Bane, NP  metFORMIN (GLUCOPHAGE) 500 MG tablet Take 1 tablet (500 mg total) by mouth daily with breakfast. 08/02/19 08/01/20  Kendell Bane, NP  metoprolol tartrate (LOPRESSOR) 25 MG tablet Take 25 mg by mouth daily.    [provider]  spironolactone (ALDACTONE) 25 MG tablet Take 1 tablet (25 mg total) by mouth daily. For cirrhosis of liver 06/19/19   Kendell Bane, NP  traZODone (DESYREL) 50 MG tablet  Take 0.5-1 tablets (25-50 mg total) by mouth at bedtime as needed for sleep. 09/05/19   Borders, Kirt Boys, NP     Vital Signs: BP (!) 157/70   Pulse 66   Temp 98 F (36.7 C) (Oral)   Resp 16   SpO2 100%   Physical Exam:  awake/alert.  Chest clear to auscultation bilaterally.  Heart with normal rate, occasional ectopy.  Abdomen soft, positive bowel sounds, nontender.  Trace  bilateral lower extremity edema noted.  Imaging: No results found.  Labs:  CBC: Recent Labs    06/27/19 1024 07/25/19 1436 07/26/19 0807 09/21/19 0900  WBC 5.2 7.8 6.3 7.2  HGB 11.7* 13.7 11.2* 12.3  HCT 36.7 40.3 32.7* 37.1  PLT 132* 158 141* 176    COAGS: Recent Labs    02/08/19 1551 09/21/19 0900  INR 2.6* 1.1    BMP: Recent Labs    06/27/19 1024 07/25/19 1436 07/26/19 0807 09/21/19 0900  NA 134* 126* 132* 139  K 4.3 4.2 3.8 4.0  CL 100 87* 102 102  CO2 24 25 22 25   GLUCOSE 318* 621* 312* 126*  BUN 24* 27* 20 25*  CALCIUM 9.2 9.8 8.7* 9.5  CREATININE 1.12* 1.73* 1.20* 1.00  GFRNONAA 52* 30* 47* 59*  GFRAA 60* 35* 55* >60    LIVER FUNCTION TESTS: Recent Labs    02/09/19 0634 02/09/19 0634 03/28/19 1700 06/27/19 1024 07/26/19 0807 09/21/19 0900  BILITOT 2.2*   < > 2.4* 0.8 1.3* 1.0  AST 45*  --  42* 24 21  --   ALT 18  --  16 16 14   --   ALKPHOS 34*  --  56 85 75  --   PROT 6.5  --  9.3* 8.7* 7.6  --   ALBUMIN 2.1*  --  2.8* 3.1* 2.8*  --    < > = values in this interval not displayed.    Assessment and Plan: 64 yo female with history of alcoholic cirrhosis now complicated by 2 small enhancing lesions in the left liver (segment 2 and segment 3) with imaging features diagnostic of hepatocellular carcinoma.  There is also a third arterially enhancing lesion in the left hepatic lobe likely representing a third focus of [place].  She was deemed an appropriate candidate for Y 90 left hepatic radioembolization following recent consultation with Dr. Laurence Ferrari and presents today for the procedure. Risks and benefits of procedure were discussed with the patient including, but not limited to bleeding, infection, vascular injury or contrast induced renal failure.  This interventional procedure involves the use of X-rays and because of the nature of the planned procedure, it is possible that we will have prolonged use of X-ray fluoroscopy.  Potential radiation  risks to you include (but are not limited to) the following: - A slightly elevated risk for cancer  several years later in life. This risk is typically less than 0.5% percent. This risk is low in comparison to the normal incidence of human cancer, which is 33% for women and 50% for men according to the [place]. - Radiation induced injury can include skin redness, resembling a rash, tissue breakdown / ulcers and hair loss (which can be temporary or permanent).   The likelihood of either of these occurring depends on the difficulty of the procedure and whether you are sensitive to radiation due to previous procedures, disease, or genetic conditions.   IF your procedure requires a prolonged use of radiation, you will be notified  and given written instructions for further action.  It is your responsibility to monitor the irradiated area for the 2 weeks following the procedure and to notify your physician if you are concerned that you have suffered a radiation induced injury.    All of the patient's questions were answered, patient is agreeable to proceed.  Consent signed and in chart.      Electronically Signed: D. Rowe Robert, PA-C 10/02/2019, 8:16 AM   I spent a total of  20 minutes at the the patient's bedside AND on the patient's hospital floor or unit, greater than 50% of which was counseling/coordinating care for left hepatic lobe Y 90 radioembolization

## 2019-10-02 NOTE — Procedures (Signed)
Interventional Radiology Procedure Note  Procedure: Segment 2 and 3 TARE with Q98  Complications: None  Estimated Blood Loss: None  Recommendations: - TR band takedown per protocol - DC Home    Signed,  Criselda Peaches, MD

## 2019-10-03 ENCOUNTER — Telehealth: Payer: Self-pay | Admitting: Primary Care

## 2019-10-03 ENCOUNTER — Other Ambulatory Visit: Payer: Self-pay | Admitting: Hospice and Palliative Medicine

## 2019-10-03 DIAGNOSIS — C220 Liver cell carcinoma: Secondary | ICD-10-CM

## 2019-10-03 NOTE — Telephone Encounter (Signed)
Spoke with patient regarding Palliative referral and explained services and she was in agreement with this.  I have scheduled an In-person Consult for 10/18/19 @ 3 PM.

## 2019-10-18 ENCOUNTER — Other Ambulatory Visit: Payer: Medicare Other | Admitting: Primary Care

## 2019-10-18 ENCOUNTER — Other Ambulatory Visit: Payer: Self-pay

## 2019-10-18 DIAGNOSIS — R9431 Abnormal electrocardiogram [ECG] [EKG]: Secondary | ICD-10-CM | POA: Diagnosis not present

## 2019-10-18 DIAGNOSIS — E1165 Type 2 diabetes mellitus with hyperglycemia: Secondary | ICD-10-CM

## 2019-10-18 DIAGNOSIS — I5032 Chronic diastolic (congestive) heart failure: Secondary | ICD-10-CM

## 2019-10-18 DIAGNOSIS — I5023 Acute on chronic systolic (congestive) heart failure: Secondary | ICD-10-CM | POA: Diagnosis not present

## 2019-10-18 DIAGNOSIS — K7030 Alcoholic cirrhosis of liver without ascites: Secondary | ICD-10-CM

## 2019-10-18 DIAGNOSIS — Z515 Encounter for palliative care: Secondary | ICD-10-CM | POA: Diagnosis not present

## 2019-10-18 NOTE — Progress Notes (Signed)
[place] Consult Note Telephone: [phone]  Fax: [phone]  PATIENT NAME: Adriana Miller [address] [place] [address] [phone] (home)  DOB: February 17, 1956 MRN: [id number]  PRIMARY CARE PROVIDER:    Ronnell Freshwater, NP,  [address] [place] [address] [phone]  REFERRING PROVIDER:   Irean Hong, NP [place],  [place] [address]   RESPONSIBLE PARTY:   Extended Emergency Contact Information Primary Emergency Contact: [employer] Phone: [phone] Mobile Phone: [phone] Relation: Brother  I met with patient and caregiver in home.  ASSESSMENT AND RECOMMENDATIONS:   1. Advance Care Planning/Goals of Care: Goals include to maximize quality of life and symptom management. Our advance care planning conversation included a discussion about:     The value and importance of advance care planning   Experiences with loved ones who have been seriously ill or have died   Exploration of personal, cultural or spiritual beliefs that might influence medical decisions   Exploration of goals of care in the event of a sudden injury or illness   Identification of a healthcare agent , has already prepared.  Creation of an  advance directive document .  I met with Ms Adriana Miller in her home. Her aide worker was there from [facility]. We discussed goals of care and palliative care as addressing quality of life in the face of serious illness. She stated she wasn't aware of what serious illness she had. She acknowledged she had diabetes. I will need to explore more her understanding of her disease process. We discussed her advance directives. She has created a power of attorney naming her brother Samuella Miller to rain as her health care power of attorney. They also worked on [employer]  form, which I completed and have uploaded in [place]. She retains the original. She is requesting full scope  of treatment we discussed what does kind of treatment might look like. She stated she wanted to have a temp for her life to be preserved long enough for her granddaughter to be able to say goodbye to her.    2. Symptom Management:    Medications we reviewed together she has a system of putting all of her daytime medicines in six days a week and all evening medicines together in one day. I asked her to obtain a multiple dose box, either one with AM and  evening or four slots per day. She thinks this will be available through her insurance. We went over the medicines and their uses and cleaned up some of her med bottles. She had A question about her Cardizem should be 240 daily or 120 mg. It seems as though this was changed to 120  mg in March by her cardiologist. She will follow up in several weeks with cardiology and verify she should be on 120 mg of Cardizem.   Glucose: She does not check glucose at home, and from hospital stay A1 C has been over 11. Encouraged to limit carbohydrates. Hospital records recent alcohol intake.   Nutrition she states she has a good appetite and sometimes drinks sweet drinks. Her care worker leaves food for her to warm up so she has help with her meals.She may benefit from a meals on wheels or insurance meal plan. Albumin  iw WNL at 3.6.  3. Family /Caregiver/Community Supports: lives alone but has brother who is poa, and Hospital doctor. Needs more support eg MOW.  4.  Cognitive / Functional decline: A and O x 2, able to walk in home (I)  5. Follow up Palliative Care Visit: Palliative care will continue to follow for goals of care clarification and symptom management. Return 4 weeks or prn.  I spent 75 minutes providing this consultation,  from 1545 to 1700. More than 50% of the time in this consultation was spent coordinating communication.   HISTORY OF PRESENT ILLNESS:  Adriana Miller is a 64 y.o. year old female with multiple medical problems including debility,  poor mobility, medication knowledge deficit,  . Palliative Care was asked to follow this patient by consultation request of Adriana Miller, Adriana Boys, NP to help address advance care planning and goals of care. This is the initial  visit.   CODE STATUS: FULL CODE  PPS: 50%  HOSPICE ELIGIBILITY/DIAGNOSIS: yes if goals are commensurate.  PAST MEDICAL HISTORY: por4/21tal hypertensive gastropathy with bleeding gastric erosions and newly diagnosed hepatocellular carcinoma with lesions in segments 2 (2.0 x 1.6 cm) and 3 (1.7 x 1.3 cm) of the left hepatic lobe.  Past Medical History:  Diagnosis Date  . B12 deficiency 04/05/2017  . Blood in stool   . Cerebral aneurysm   . Chronic kidney disease   . Diabetes mellitus without complication ([place])   . Gastric erosion with bleeding   . Hyperlipidemia   . Hypertension   . Lower extremity cellulitis 02/08/2019  . Seizure disorder [facility])     SOCIAL HX:  Social History   Tobacco Use  . Smoking status: Never Smoker  . Smokeless tobacco: Current User    Types: Snuff  Substance Use Topics  . Alcohol use: Not Currently    Alcohol/week: 14.0 standard drinks    Types: 14 Cans of beer per week    Comment: patient quit in october 2020    ALLERGIES:  Allergies  Allergen Reactions  . Aspirin Other (See Comments)    Nose bleed     PERTINENT MEDICATIONS:  Outpatient Encounter Medications as of 10/18/2019  Medication Sig  . allopurinol (ZYLOPRIM) 100 MG tablet Take 1 tablet (100 mg total) by mouth daily.  Marland Kitchen apixaban (ELIQUIS) 5 MG TABS tablet Take 1 tablet (5 mg total) by mouth 2 (two) times daily.  Marland Kitchen diltiazem (CARTIA XT) 240 MG 24 hr capsule Take 1 capsule (240 mg total) by mouth daily.  Marland Kitchen escitalopram (LEXAPRO) 10 MG tablet Take 1 tablet (10 mg total) by mouth daily. For anxiety/sleep  . furosemide (LASIX) 20 MG tablet Take 1 tablet (20 mg total) by mouth daily.  Marland Kitchen glimepiride (AMARYL) 1 MG tablet Take 1 tablet (1 mg total) by mouth every morning.  .  metFORMIN (GLUCOPHAGE) 500 MG tablet Take 1 tablet (500 mg total) by mouth daily with breakfast.  . metoprolol tartrate (LOPRESSOR) 25 MG tablet Take 25 mg by mouth daily.  Marland Kitchen spironolactone (ALDACTONE) 25 MG tablet Take 1 tablet (25 mg total) by mouth daily. For cirrhosis of liver  . traZODone (DESYREL) 50 MG tablet Take 0.5-1 tablets (25-50 mg total) by mouth at bedtime as needed for sleep.   No facility-administered encounter medications on file as of 10/18/2019.    PHYSICAL EXAM / ROS:   Current and past weights: 160 lbs. States this is a stable weight. General: NAD  HEENT: anicteric sclera Cardiovascular: no chest pain reported, sl LE edema  Pulmonary: no cough, no increased SOB, Slight DOE, room air Abdomen: appetite good, endorses  Occ. constipation, continent of bowel, + abdominal ascites GU: denies  dysuria, continent of urine MSK:  + joint and ROM abnormalities, ambulatory, denies falls Skin: no rashes or wounds reported Neurological: Weakness, deficits  in motor function, h/o seizures. Denies pain , forgetful  Jason Coop, NP [facility]  COVID-19 PATIENT SCREENING TOOL  Person answering questions: ____________self_____ _____   1.  Is the patient or any family member in the home showing any signs or symptoms regarding respiratory infection?               Person with Symptom- __________NA_________________  a. Fever                                                                          Yes___ No___          ___________________  b. Shortness of breath                                                    Yes___ No___          ___________________ c. Cough/congestion                                       Yes___  No___         ___________________ d. Body aches/pains                                                         Yes___ No___        ____________________ e. Gastrointestinal symptoms (diarrhea, nausea)           Yes___ No___        ____________________  2. Within the  past 14 days, has anyone living in the home had any contact with someone with or under investigation for COVID-19?    Yes___ No_X_   Person __________________

## 2019-10-19 ENCOUNTER — Ambulatory Visit: Payer: Medicare Other | Admitting: Family

## 2019-10-29 ENCOUNTER — Encounter: Payer: Self-pay | Admitting: Physician Assistant

## 2019-10-29 ENCOUNTER — Other Ambulatory Visit: Payer: Self-pay

## 2019-10-29 ENCOUNTER — Ambulatory Visit (INDEPENDENT_AMBULATORY_CARE_PROVIDER_SITE_OTHER): Payer: Medicare Other | Admitting: Physician Assistant

## 2019-10-29 VITALS — BP 110/64 | HR 58 | Ht 66.0 in | Wt 171.5 lb

## 2019-10-29 DIAGNOSIS — I10 Essential (primary) hypertension: Secondary | ICD-10-CM

## 2019-10-29 DIAGNOSIS — I5032 Chronic diastolic (congestive) heart failure: Secondary | ICD-10-CM

## 2019-10-29 DIAGNOSIS — Z7901 Long term (current) use of anticoagulants: Secondary | ICD-10-CM | POA: Diagnosis not present

## 2019-10-29 DIAGNOSIS — Z8719 Personal history of other diseases of the digestive system: Secondary | ICD-10-CM

## 2019-10-29 DIAGNOSIS — I4892 Unspecified atrial flutter: Secondary | ICD-10-CM

## 2019-10-29 DIAGNOSIS — I480 Paroxysmal atrial fibrillation: Secondary | ICD-10-CM

## 2019-10-29 DIAGNOSIS — Z720 Tobacco use: Secondary | ICD-10-CM

## 2019-10-29 DIAGNOSIS — K7460 Unspecified cirrhosis of liver: Secondary | ICD-10-CM

## 2019-10-29 DIAGNOSIS — C220 Liver cell carcinoma: Secondary | ICD-10-CM

## 2019-10-29 DIAGNOSIS — Z87898 Personal history of other specified conditions: Secondary | ICD-10-CM

## 2019-10-29 NOTE — Patient Instructions (Signed)
Medication Instructions:  Your physician recommends that you continue on your current medications as directed. Please refer to the Current Medication list given to you today.  *If you need a refill on your cardiac medications before your next appointment, please call your pharmacy*   Lab Work: None ordered If you have labs (blood work) drawn today and your tests are completely normal, you will receive your results only by: Marland Kitchen MyChart Message (if you have MyChart) OR . A paper copy in the mail If you have any lab test that is abnormal or we need to change your treatment, we will call you to review the results.   Testing/Procedures: None ordered   Follow-Up: At [facility], you and your health needs are our priority.  As part of our continuing mission to provide you with exceptional heart care, we have created designated Provider Care Teams.  These Care Teams include your primary Cardiologist (physician) and Advanced Practice Providers (APPs -  Physician Assistants and Nurse Practitioners) who all work together to provide you with the care you need, when you need it.  We recommend signing up for the patient portal called "MyChart".  Sign up information is provided on this After Visit Summary.  MyChart is used to connect with patients for Virtual Visits (Telemedicine).  Patients are able to view lab/test results, encounter notes, upcoming appointments, etc.  Non-urgent messages can be sent to your provider as well.   To learn more about what you can do with MyChart, go to [email or web address].    Your next appointment:   6 month(s)  The format for your next appointment:   In Person  Provider:    You may see Nelva Bush, MD or one of the following Advanced Practice Providers on your designated Care Team:    Murray Hodgkins, NP  Christell Faith, PA-C  Marrianne Mood, PA-C    Other Instructions Please contact primary MD to further discuss urinary symptoms.

## 2019-10-29 NOTE — Progress Notes (Addendum)
Office Visit    Patient Name: Adriana Miller Date of Encounter: 10/29/2019  Primary Care Provider:  Ronnell Freshwater, NP Primary Cardiologist:  Nelva Bush, MD  Chief Complaint    Chief Complaint  Patient presents with  . office visit    6 month F/U; Meds verbally reviewed with patient.   64 year old female with history of persistent A. fib and a flutter, hypertension, hyperlipidemia, DM2 with neuropathy, CKD, cirrhosis complicated by GI bleed 84/1324, recently diagnosed hepatocellular carcinoma s/p trans arterial radio embolization of the L hepatic lobe, L elbow gout, seizure disorder, and cerebral aneurysm s/p repair, and who presents for follow-up of atrial fibrillation.  Past Medical History    Past Medical History:  Diagnosis Date  . B12 deficiency 04/05/2017  . Blood in stool   . Cerebral aneurysm   . Chronic kidney disease   . Diabetes mellitus without complication ([place])   . Gastric erosion with bleeding   . Hyperlipidemia   . Hypertension   . Lower extremity cellulitis 02/08/2019  . Seizure disorder [facility])    Past Surgical History:  Procedure Laterality Date  . CEREBRAL ANEURYSM REPAIR  1991  . COLONOSCOPY WITH PROPOFOL N/A 02/12/2019   Procedure: COLONOSCOPY WITH PROPOFOL;  Surgeon: Lin Landsman, MD;  Location: [facility] ENDOSCOPY;  Service: Gastroenterology;  Laterality: N/A;  . ESOPHAGOGASTRODUODENOSCOPY (EGD) WITH PROPOFOL N/A 02/12/2019   Procedure: ESOPHAGOGASTRODUODENOSCOPY (EGD) WITH PROPOFOL;  Surgeon: Lin Landsman, MD;  Location: [facility] ENDOSCOPY;  Service: Gastroenterology;  Laterality: N/A;  . IR ANGIOGRAM SELECTIVE EACH ADDITIONAL VESSEL  09/21/2019  . IR ANGIOGRAM SELECTIVE EACH ADDITIONAL VESSEL  09/21/2019  . IR ANGIOGRAM SELECTIVE EACH ADDITIONAL VESSEL  09/21/2019  . IR ANGIOGRAM SELECTIVE EACH ADDITIONAL VESSEL  09/21/2019  . IR ANGIOGRAM SELECTIVE EACH ADDITIONAL VESSEL  10/02/2019  . IR ANGIOGRAM VISCERAL SELECTIVE  09/21/2019  . IR  ANGIOGRAM VISCERAL SELECTIVE  09/21/2019  . IR ANGIOGRAM VISCERAL SELECTIVE  10/02/2019  . IR EMBO TUMOR ORGAN ISCHEMIA INFARCT INC GUIDE ROADMAPPING  10/02/2019  . IR EMBO TUMOR ORGAN ISCHEMIA INFARCT INC GUIDE ROADMAPPING  10/02/2019  . IR RADIOLOGIST EVAL & MGMT  08/28/2019  . IR US GUIDE VASC ACCESS LEFT  09/21/2019  . IR US GUIDE VASC ACCESS LEFT  10/02/2019    Allergies  Allergies  Allergen Reactions  . Aspirin Other (See Comments)    Nose bleed    History of Present Illness    FLORDIA Adriana Miller is a 64 y.o. female with PMH as above.   She was seen by GI 05/31/2019 with recommendation for referral to hematology given elevated total protein and low albumin.    When seen in clinic 06/06/2019, she had recently taken extra Lasix for 3 to 4 days and then dropped back down to Lasix 20 mg.  With this increase, her shortness of breath and lower extremity edema had resolved. She did admit to precooked foods. EKG showed atrial flutter with ventricular rate 119 bpm.  She was asymptomatic. GI had approved restart of [place] after 01/2019 GIB. It was suspected that her atrial flutter was triggered by her recent volume overload.  She was restarted on metoprolol tartrate at 12.5 mg twice daily with recommendation to follow-up in 2 weeks.  If still in atrial fib/flutter at that time, it was recommended she be scheduled for cardioversion.      On RTC 3/5, she was back in NSR. BP is soft to hypotensive and HR improved from previous visit. She reported  using snuff and stated she likely would not quit but agreeable to cut back. Discussed diet and exercise. She did not own a cuff but would get one.    She was seen by her primary cardiologist 07/2019 and noted L arm cramping and weakness that began 3 days before. She noted similar sx in the past with her sz. She was back in Afib. Concern was noted for CVA versus sz. Recommendation was to present to the ED. It was noted that preference would be to discontinue amlodipine  for escalation of rate control with Cardizem or BB.  In the ED, she was noted to be dehydrated and received IVF. IV Cardizem was administered for rate control. She was discharged on an increased dose of Cardizem 240mg  daily (on Cardizem 120mg  daily before that time) with notes indicating to follow-up with PCP to ensure HR and BP stable. Amlodipine was discontinued. She was found to have uncontrolled DM2 and cautioned against further sugary food and sodas. It was also noted that, in addition to dehydration, her hand and arm could be 2/2 poor glycemic control.   Since that time, she has been diagnosed with hepatocellular carcinoma. She has been followed closely by oncology, IR, and palliative care. After consultation, she was deemed not to be a good surgical candidate for resection or transplant. She was recommended to have transarterial radio embolization segmentectomy of hepatic segment 2 and 3, and it appears she underwent this procedure 10/02/19.   It also should be noted that, since her last clinic visit, she has gained a significant amount of weight. On review of EMR, she was started on mirtazapine. After significant weight gain, mirtazapine was then discontinued.  She returns to clinic today and reports she has been doing well from a cardiac standpoint.  She denies any further left arm cramping or weakness. She denies chest pain, palpitations, dyspnea, pnd, orthopnea, n, v, dizziness, syncope, or early satiety. She reports medication compliance. She reports edema if she crosses her legs at night but otherwise is without edema. She uses 2 pillows for comfort and reports she is sleeping well. Her main complaint is of green urine and stool, for which her PCP was notified. She reports medication compliance.  She denies any signs or symptoms of bleeding. No presyncope or syncope with her sinus bradycardia today. She reports that she is eating well. As above, she did gain significant weight on mirtazapine.   She is ambulating without a cane without any concerning symptoms. She denies any recent or frequent falls. She reports that she cut back on snuff but has not completely eliminated it.  Home Medications    Prior to Admission medications   Medication Sig Start Date End Date Taking? Authorizing Provider  allopurinol (ZYLOPRIM) 100 MG tablet Take 1 tablet (100 mg total) by mouth daily. 08/02/19   Kendell Bane, NP  apixaban (ELIQUIS) 5 MG TABS tablet Take 1 tablet (5 mg total) by mouth 2 (two) times daily. 06/12/19   Loel Dubonnet, NP  diltiazem (CARTIA XT) 240 MG 24 hr capsule Take 1 capsule (240 mg total) by mouth daily. 07/26/19 07/25/20  Vashti Hey, MD  escitalopram (LEXAPRO) 10 MG tablet Take 1 tablet (10 mg total) by mouth daily. For anxiety/sleep 03/20/19   Lavera Guise, MD  furosemide (LASIX) 20 MG tablet Take 1 tablet (20 mg total) by mouth daily. 09/21/19   Lavera Guise, MD  glimepiride (AMARYL) 1 MG tablet Take 1 tablet (1 mg total) by  mouth every morning. 08/02/19 08/01/20  Kendell Bane, NP  metFORMIN (GLUCOPHAGE) 500 MG tablet Take 1 tablet (500 mg total) by mouth daily with breakfast. 08/02/19 08/01/20  Kendell Bane, NP  metoprolol tartrate (LOPRESSOR) 25 MG tablet Take 25 mg by mouth daily.    [provider]  spironolactone (ALDACTONE) 25 MG tablet Take 1 tablet (25 mg total) by mouth daily. For cirrhosis of liver 06/19/19   Kendell Bane, NP  traZODone (DESYREL) 50 MG tablet Take 0.5-1 tablets (25-50 mg total) by mouth at bedtime as needed for sleep. 09/05/19   Borders, Kirt Boys, NP    Review of Systems    She denies chest pain, palpitations, dyspnea, pnd, orthopnea, n, v, dizziness, syncope, or early satiety.  She reports green urine and stool which has (decreased in frequency but is not completely eliminated and still occurs).  She reports resolution of her left arm cramping, possibly previously due to dehydration / sugar / gout.  She reports weight  gain, possibly due to mirtazapine, now discontinued. She reports LEE if she crosses her legs at night and otherwise is without edema. All other systems reviewed and are otherwise negative except as noted above.  Physical Exam    VS:  BP 110/64 (BP Location: Left Arm, Patient Position: Sitting, Cuff Size: Normal)   Pulse (!) 58   Ht 5\' 6"  (1.676 m)   Wt 171 lb 8 oz (77.8 kg)   SpO2 98%   BMI 27.68 kg/m  , BMI Body mass index is 27.68 kg/m. GEN: Well nourished, well developed, in no acute distress.  Seated in wheelchair. HEENT: normal. Neck: Supple, no JVD, carotid bruits, or masses. Cardiac:regular and bradycardic, no murmurs, rubs, or gallops. No clubbing, cyanosis, edema.  Radials/DP/PT 2+ and equal bilaterally.  Respiratory:  Respirations regular and unlabored, clear to auscultation bilaterally. GI: Soft, nontender, nondistended, BS + x 4. MS: no deformity or atrophy. Skin: warm and dry, no rash. Neuro:  Strength and sensation are intact. Psych: Normal affect.  Accessory Clinical Findings    ECG personally reviewed by me today -sinus bradycardia has replaced Afib with RVR from 07/25/19, 58bpm- no acute changes.  VITALS Reviewed today   Temp Readings from Last 3 Encounters:  10/02/19 97.9 F (36.6 C) (Oral)  09/21/19 98 F (36.7 C) (Oral)  09/05/19 (!) 96 F (35.6 C)   BP Readings from Last 3 Encounters:  10/29/19 110/64  10/02/19 128/69  09/21/19 135/68   Pulse Readings from Last 3 Encounters:  10/29/19 (!) 58  10/02/19 61  09/21/19 63    Wt Readings from Last 3 Encounters:  10/29/19 171 lb 8 oz (77.8 kg)  09/05/19 169 lb 9.6 oz (76.9 kg)  08/28/19 166 lb 4.8 oz (75.4 kg)     LABS  reviewed today   Lab Results  Component Value Date   WBC 6.1 10/02/2019   HGB 12.0 10/02/2019   HCT 37.5 10/02/2019   MCV 96.4 10/02/2019   PLT 151 10/02/2019   Lab Results  Component Value Date   CREATININE 1.19 (H) 10/02/2019   BUN 20 10/02/2019   NA 141 10/02/2019   K  3.8 10/02/2019   CL 103 10/02/2019   CO2 27 10/02/2019   Lab Results  Component Value Date   ALT 15 10/02/2019   AST 23 10/02/2019   ALKPHOS 89 10/02/2019   BILITOT 0.6 10/02/2019   Lab Results  Component Value Date   CHOL 134 01/27/2018   HDL 80  01/27/2018   LDLCALC 42 01/27/2018   TRIG 60 01/27/2018    Lab Results  Component Value Date   HGBA1C 11.6 (H) 07/26/2019   Lab Results  Component Value Date   TSH 3.040 06/06/2019     STUDIES/PROCEDURES reviewed today   Echo 12/2018 1. Left ventricular ejection fraction, by visual estimation, is 55 to  60%. The left ventricle has normal function. Normal left ventricular size.  There is no left ventricular hypertrophy.  2. Global right ventricle has normal systolic function.The right  ventricular size is normal. No increase in right ventricular wall  thickness.  3. Left atrial size was normal.  4. Right atrial size was normal.  5. The mitral valve is normal in structure. No evidence of mitral valve  regurgitation. No evidence of mitral stenosis.  6. The tricuspid valve is normal in structure. Tricuspid valve  regurgitation is trivial.  7. The aortic valve is normal in structure. Aortic valve regurgitation  was not visualized by color flow Doppler. Structurally normal aortic  valve, with no evidence of sclerosis or stenosis.  8. The pulmonic valve was normal in structure. Pulmonic valve  regurgitation is trivial by color flow Doppler.  9. TR signal is inadequate for assessing pulmonary artery systolic  pressure.  10. The inferior vena cava is normal in size with greater than 50%  respiratory variability, suggesting right atrial pressure of 3 mmHg.   Assessment & Plan    Paroxysmal atrial fibrillation --Sinus bradycardia today, which has replaced atrial fibrillation with rapid ventricular rate from previous visit.  She reports compliance with metoprolol and diltiazem for rate control.  Diltiazem 120mg  has been  increased to 240mg  daily since her last visit. She denies any presyncope or syncope with her slightly bradycardic rate today; however, we discussed recommendation to call the office if dizzy or lightheaded with bradycardia in the future, and we can decrease this dose back down to her previous diltiazem 120mg . Given she appears to be tolerating her increased dose at this time, we will defer any changes at this time. She ambulates well around the house and reportedly uses a cane when outside. No reported falls. She denies any further left arm weakness or cramps.  She reports medication compliance with her anticoagulation (oncology aware on [facility]).  No signs or symptoms of bleeding with known history of GIB 01/2019 (previously on reduced dose due to GIB). Continue Eliquis 5mg  BID with recommendation to contact our office if any upcoming procedures for [facility] and for recommendations regarding anticoagulation at that time. She denies any upcoming procedures today.  Chronic HFpEF --Euvolemic and well compensated on exam.  No reported signs or symptoms suggestive of worsening heart failure. Significant weight gain noted, which has been attributed to mirtazapine, now discontinued. She does not feel her weight gain is due to volume, which is consistent with her exam. 12/2018 echo as above. Continue current Lasix 20 mg and spironolactone 25 mg. Reached out to her PCP today regarding her report of ongoing green urine and stool as below.  Instructed the patient that she may need to follow-up with her PCP this week for further testing, if indicated.    Recently diagnosed hepatocellular carcinoma History of cirrhosis --As above. She is not currently a surgical or transplant candidate. She is currently followed by oncology, IR, and palliative care. Recommend that she reach out to use if any upcoming procedures to discuss the length of time anticoagulation be held - at this time, she is not aware of  any upcoming procedures. Notes  indicate that oncology is aware she is on anticoagulation.Reached out to her PCP regarding her recent green stool and urine.   Urine and stool color change --Will defer further workup to PCP recommendations for further workup. She will continue to monitor her stool and urine at home. Denies any s/sx of bleeding or dark stool. Denies any change in smell to her urine.   Hypertension -BP well controlled today on increased dose Cardizem.  No presyncope or syncope.  Continue to monitor BP.  Tobacco use --Reports she has cut back on snuff but not yet quit.  Congratulations provided for reducing her intake of tobacco.  Complete cessation recommended.   Medication changes: None Labs ordered: None. Will defer labs for green stool and urine to PCP. Studies / Imaging ordered: None Disposition: RTC 6 months     Arvil Chaco, PA-C 10/29/2019

## 2019-10-30 ENCOUNTER — Telehealth: Payer: Self-pay

## 2019-10-30 NOTE — Telephone Encounter (Signed)
CONFIRMED PATIENT APPT. -AR °

## 2019-11-01 ENCOUNTER — Encounter: Payer: Self-pay | Admitting: Adult Health

## 2019-11-01 ENCOUNTER — Other Ambulatory Visit: Payer: Self-pay

## 2019-11-01 ENCOUNTER — Ambulatory Visit (INDEPENDENT_AMBULATORY_CARE_PROVIDER_SITE_OTHER): Payer: Medicare Other | Admitting: Adult Health

## 2019-11-01 VITALS — BP 134/63 | HR 73 | Temp 97.5°F | Resp 16 | Ht 66.0 in | Wt 170.2 lb

## 2019-11-01 DIAGNOSIS — I10 Essential (primary) hypertension: Secondary | ICD-10-CM

## 2019-11-01 DIAGNOSIS — E538 Deficiency of other specified B group vitamins: Secondary | ICD-10-CM

## 2019-11-01 DIAGNOSIS — E119 Type 2 diabetes mellitus without complications: Secondary | ICD-10-CM | POA: Diagnosis not present

## 2019-11-01 DIAGNOSIS — C220 Liver cell carcinoma: Secondary | ICD-10-CM | POA: Diagnosis not present

## 2019-11-01 DIAGNOSIS — K7031 Alcoholic cirrhosis of liver with ascites: Secondary | ICD-10-CM

## 2019-11-01 LAB — POCT GLYCOSYLATED HEMOGLOBIN (HGB A1C): Hemoglobin A1C: 7 % — AB (ref 4.0–5.6)

## 2019-11-01 MED ORDER — CYANOCOBALAMIN 1000 MCG/ML IJ SOLN
1000.0000 ug | Freq: Once | INTRAMUSCULAR | Status: AC
  Administered 2019-11-01: 1000 ug via INTRAMUSCULAR

## 2019-11-01 MED ORDER — GLIMEPIRIDE 1 MG PO TABS: 1.0000 mg | ORAL_TABLET | ORAL | 0 refills | Status: DC

## 2019-11-01 MED ORDER — METFORMIN HCL 500 MG PO TABS: 500.0000 mg | ORAL_TABLET | Freq: Every day | ORAL | 0 refills | Status: DC

## 2019-11-01 NOTE — Progress Notes (Signed)
[facility] [place], [place] [address]  Internal MEDICINE  Office Visit Note  Patient Name: Adriana Miller  [id number]  [id number]  Date of Service: 11/01/2019  Chief Complaint  Patient presents with   Follow-up    discuss statin therapy     HPI  PT is here for follow up on DM, Roy, and HTN.  Her a1c is much improved today to 7.0, from 11.6.  She is doing well with her blood sugars at this time. Her blood pressure is ell controlled.  Denies Chest pain, Shortness of breath, palpitations, headache, or blurred vision. She continues to follow up with GI with her ongoing liver issues.      Current Medication: Outpatient Encounter Medications as of 11/01/2019  Medication Sig   allopurinol (ZYLOPRIM) 100 MG tablet Take 1 tablet (100 mg total) by mouth daily.   apixaban (ELIQUIS) 5 MG TABS tablet Take 1 tablet (5 mg total) by mouth 2 (two) times daily.   diltiazem (CARTIA XT) 240 MG 24 hr capsule Take 1 capsule (240 mg total) by mouth daily.   escitalopram (LEXAPRO) 10 MG tablet Take 1 tablet (10 mg total) by mouth daily. For anxiety/sleep   furosemide (LASIX) 20 MG tablet Take 1 tablet (20 mg total) by mouth daily.   glimepiride (AMARYL) 1 MG tablet Take 1 tablet (1 mg total) by mouth every morning.   metFORMIN (GLUCOPHAGE) 500 MG tablet Take 1 tablet (500 mg total) by mouth daily with breakfast.   metoprolol tartrate (LOPRESSOR) 25 MG tablet Take 25 mg by mouth daily.   spironolactone (ALDACTONE) 25 MG tablet Take 1 tablet (25 mg total) by mouth daily. For cirrhosis of liver   traZODone (DESYREL) 50 MG tablet Take 0.5-1 tablets (25-50 mg total) by mouth at bedtime as needed for sleep.   [DISCONTINUED] glimepiride (AMARYL) 1 MG tablet Take 1 tablet (1 mg total) by mouth every morning.   [DISCONTINUED] metFORMIN (GLUCOPHAGE) 500 MG tablet Take 1 tablet (500 mg total) by mouth daily with breakfast.   Facility-Administered Encounter Medications as of  11/01/2019  Medication   cyanocobalamin ((VITAMIN B-12)) injection 1,000 mcg    Surgical History: Past Surgical History:  Procedure Laterality Date   CEREBRAL ANEURYSM REPAIR  1991   COLONOSCOPY WITH PROPOFOL N/A 02/12/2019   Procedure: COLONOSCOPY WITH PROPOFOL;  Surgeon: Lin Landsman, MD;  Location: [facility] ENDOSCOPY;  Service: Gastroenterology;  Laterality: N/A;   ESOPHAGOGASTRODUODENOSCOPY (EGD) WITH PROPOFOL N/A 02/12/2019   Procedure: ESOPHAGOGASTRODUODENOSCOPY (EGD) WITH PROPOFOL;  Surgeon: Lin Landsman, MD;  Location: [facility] ENDOSCOPY;  Service: Gastroenterology;  Laterality: N/A;   IR ANGIOGRAM SELECTIVE EACH ADDITIONAL VESSEL  09/21/2019   IR ANGIOGRAM SELECTIVE EACH ADDITIONAL VESSEL  09/21/2019   IR ANGIOGRAM SELECTIVE EACH ADDITIONAL VESSEL  09/21/2019   IR ANGIOGRAM SELECTIVE EACH ADDITIONAL VESSEL  09/21/2019   IR ANGIOGRAM SELECTIVE EACH ADDITIONAL VESSEL  10/02/2019   IR ANGIOGRAM VISCERAL SELECTIVE  09/21/2019   IR ANGIOGRAM VISCERAL SELECTIVE  09/21/2019   IR ANGIOGRAM VISCERAL SELECTIVE  10/02/2019   IR EMBO TUMOR ORGAN ISCHEMIA INFARCT INC GUIDE ROADMAPPING  10/02/2019   IR EMBO TUMOR ORGAN ISCHEMIA INFARCT INC GUIDE ROADMAPPING  10/02/2019   IR RADIOLOGIST EVAL & MGMT  08/28/2019   IR US GUIDE VASC ACCESS LEFT  09/21/2019   IR US GUIDE VASC ACCESS LEFT  10/02/2019    Medical History: Past Medical History:  Diagnosis Date   B12 deficiency 04/05/2017   Blood in stool  Cerebral aneurysm    Chronic kidney disease    Diabetes mellitus without complication (HCC)    Gastric erosion with bleeding    Hyperlipidemia    Hypertension    Lower extremity cellulitis 02/08/2019   Seizure disorder (HCC)     Family History: Family History  Problem Relation Age of Onset   Heart attack Brother 33    Social History   Socioeconomic History   Marital status: Divorced    Spouse name: Not on file   Number of children: Not on file   Years of  education: Not on file   Highest education level: Not on file  Occupational History   Not on file  Tobacco Use   Smoking status: Never Smoker   Smokeless tobacco: Current User    Types: Snuff  Vaping Use   Vaping Use: Never used  Substance and Sexual Activity   Alcohol use: Not Currently    Alcohol/week: 14.0 standard drinks    Types: 14 Cans of beer per week    Comment: patient quit in october 2020   Drug use: No   Sexual activity: Not Currently  Other Topics Concern   Not on file  Social History Narrative   Not on file   Social Determinants of Health   Financial Resource Strain:    Difficulty of Paying Living Expenses:   Food Insecurity:    Worried About Charity fundraiser in the Last Year:    Arboriculturist in the Last Year:   Transportation Needs:    Film/video editor (Medical):    Lack of Transportation (Non-Medical):   Physical Activity:    Days of Exercise per Week:    Minutes of Exercise per Session:   Stress:    Feeling of Stress :   Social Connections:    Frequency of Communication with Friends and Family:    Frequency of Social Gatherings with Friends and Family:    Attends Religious Services:    Active Member of Clubs or Organizations:    Attends Music therapist:    Marital Status:   Intimate Partner Violence:    Fear of Current or Ex-Partner:    Emotionally Abused:    Physically Abused:    Sexually Abused:       Review of Systems  Constitutional: Negative for chills, fatigue and unexpected weight change.  HENT: Negative for congestion, rhinorrhea, sneezing and sore throat.   Eyes: Negative for photophobia, pain and redness.  Respiratory: Negative for cough, chest tightness and shortness of breath.   Cardiovascular: Negative for chest pain and palpitations.  Gastrointestinal: Negative for abdominal pain, constipation, diarrhea, nausea and vomiting.  Endocrine: Negative.   Genitourinary: Negative  for dysuria and frequency.  Musculoskeletal: Negative for arthralgias, back pain, joint swelling and neck pain.  Skin: Negative for rash.  Allergic/Immunologic: Negative.   Neurological: Negative for tremors and numbness.  Hematological: Negative for adenopathy. Does not bruise/bleed easily.  Psychiatric/Behavioral: Negative for behavioral problems and sleep disturbance. The patient is not nervous/anxious.     Vital Signs: BP 134/63    Pulse 73    Temp (!) 97.5 F (36.4 C)    Resp 16    Ht 5\' 6"  (1.676 m)    Wt 170 lb 3.2 oz (77.2 kg)    SpO2 98%    BMI 27.47 kg/m    Physical Exam Vitals and nursing note reviewed.  Constitutional:      General: She is not in acute  distress.    Appearance: She is well-developed. She is not diaphoretic.  HENT:     Head: Normocephalic and atraumatic.     Mouth/Throat:     Pharynx: No oropharyngeal exudate.  Eyes:     Pupils: Pupils are equal, round, and reactive to light.  Neck:     Thyroid: No thyromegaly.     Vascular: No JVD.     Trachea: No tracheal deviation.  Cardiovascular:     Rate and Rhythm: Normal rate and regular rhythm.     Heart sounds: Normal heart sounds. No murmur heard.  No friction rub. No gallop.   Pulmonary:     Effort: Pulmonary effort is normal. No respiratory distress.     Breath sounds: Normal breath sounds. No wheezing or rales.  Chest:     Chest wall: No tenderness.  Abdominal:     Palpations: Abdomen is soft.     Tenderness: There is no abdominal tenderness. There is no guarding.  Musculoskeletal:        General: Normal range of motion.     Cervical back: Normal range of motion and neck supple.  Lymphadenopathy:     Cervical: No cervical adenopathy.  Skin:    General: Skin is warm and dry.  Neurological:     Mental Status: She is alert and oriented to person, place, and time.     Cranial Nerves: No cranial nerve deficit.  Psychiatric:        Behavior: Behavior normal.        Thought Content: Thought  content normal.        Judgment: Judgment normal.    Assessment/Plan: 1. Type 2 diabetes mellitus without complication, without long-term current use of insulin (HCC) Continue Metformin and Amaryl for Sugar control.  - POCT HgB A1C - metFORMIN (GLUCOPHAGE) 500 MG tablet; Take 1 tablet (500 mg total) by mouth daily with breakfast.  Dispense: 90 tablet; Refill: 0 - glimepiride (AMARYL) 1 MG tablet; Take 1 tablet (1 mg total) by mouth every morning.  Dispense: 90 tablet; Refill: 0  2. Essential hypertension Well controlled.   3. Hepatocellular carcinoma ([place]) Continue with current therapy at this time.   4. B12 deficiency - cyanocobalamin ((VITAMIN B-12)) injection 1,000 mcg  5. Alcoholic cirrhosis of liver with ascites ([place]) Continue with current management.   General Counseling: aarilyn dye understanding of the findings of todays visit and agrees with plan of treatment. I have discussed any further diagnostic evaluation that may be needed or ordered today. We also reviewed her medications today. she has been encouraged to call the office with any questions or concerns that should arise related to todays visit.    Orders Placed This Encounter  Procedures   POCT HgB A1C    Meds ordered this encounter  Medications   cyanocobalamin ((VITAMIN B-12)) injection 1,000 mcg   metFORMIN (GLUCOPHAGE) 500 MG tablet    Sig: Take 1 tablet (500 mg total) by mouth daily with breakfast.    Dispense:  90 tablet    Refill:  0   glimepiride (AMARYL) 1 MG tablet    Sig: Take 1 tablet (1 mg total) by mouth every morning.    Dispense:  90 tablet    Refill:  0    Time spent: 30 Minutes   This patient was seen by Orson Gear AGNP-C in Collaboration with Dr Lavera Guise as a part of collaborative care agreement     Kendell Bane AGNP-C Internal medicine

## 2019-11-12 ENCOUNTER — Telehealth: Payer: Self-pay

## 2019-11-12 DIAGNOSIS — C220 Liver cell carcinoma: Secondary | ICD-10-CM

## 2019-11-12 NOTE — Telephone Encounter (Signed)
Please schedule patient for follow up lab cbc cmp AFP next week, 1 day prior to MD thanks.  Inform patient of appt details.

## 2019-11-13 NOTE — Telephone Encounter (Signed)
Done Both appts were scheduled as requested. I was unable to reach pt by phone but a detailed message was left on her vmail making her aware of her scheduled 8/5 lab appt and to RTC 1 day later on 8/6 to see Dr. Tasia Catchings @ 11:15 A appt remainder letter will be mailed out also

## 2019-11-15 ENCOUNTER — Other Ambulatory Visit: Payer: Medicare Other | Admitting: Primary Care

## 2019-11-15 ENCOUNTER — Other Ambulatory Visit: Payer: Self-pay

## 2019-11-15 DIAGNOSIS — I5023 Acute on chronic systolic (congestive) heart failure: Secondary | ICD-10-CM | POA: Diagnosis not present

## 2019-11-15 DIAGNOSIS — Z515 Encounter for palliative care: Secondary | ICD-10-CM | POA: Diagnosis not present

## 2019-11-15 DIAGNOSIS — I5032 Chronic diastolic (congestive) heart failure: Secondary | ICD-10-CM

## 2019-11-15 DIAGNOSIS — K7030 Alcoholic cirrhosis of liver without ascites: Secondary | ICD-10-CM

## 2019-11-15 NOTE — Progress Notes (Signed)
[facility] Consult Note Telephone: [phone]  Fax: [phone]  PATIENT NAME: Adriana Miller [address] [place] [address] [phone] (home)  DOB: July 08, 1955 MRN: [id number]  PRIMARY CARE PROVIDER:    Ronnell Freshwater, NP,  [address] [place] [address] [phone]  REFERRING PROVIDER:   Irean Hong, NP [place],  [place] [address] [phone]  RESPONSIBLE PARTY:   Extended Emergency Contact Information Primary Emergency Contact: [place] Phone: [phone] Mobile Phone: [phone] Relation: Brother  I met face to face with patient and and caregiver in the  home.  ASSESSMENT AND RECOMMENDATIONS:   1. Advance Care Planning/Goals of Care: Goals include to maximize quality of life and symptom management. MOST on file with full scope of care discussed and prepared on previous visit. Continues to endorse desire to be independent in her home with help.  2. Symptom Management:   DM: Recent Hgb A1C 7%, down from 11.6% in April.  We discussed her diet and diet choices. She states she has been doing well with dietary control. We reviewed dietary carbohydrates.  Pain: Endorses under right rib cage , chronic aching. She states she takes some tylenol and it helps. She states she knows it is her liver causing pain.  Coordination of care: Was not aware of upcoming appointments and needs to obtain public transportation. She will call [facility] tomorrow for ride appt.Discussion with cancer center to consolidate appointments.   3. Follow up Palliative Care Visit: Palliative care will continue to follow for goals of care clarification and symptom management. Return 4-6 weeks or prn.  4. Family /Caregiver/Community Supports:  Has in home aid, has brother who also assists with care needs occasionally. Has [facility] transport, Dietitian.  5. Cognitive / Functional decline: A and O x  3. Able to ambulate in home.  I spent 60 minutes providing this consultation,  from 1345 to 1445. More than 50% of the time in this consultation was spent coordinating communication.   CHIEF COMPLAINT: RUQ pain  HISTORY OF PRESENT ILLNESS:  [name] is a 64 y.o. year old female with multiple medical problems including DM, CKD, a-fib, cirrhosis secondary to alcohol abuse. Palliative Care was asked to follow this patient by consultation request of [place], Kirt Boys, NP   to help address advance care planning and goals of care. This is a follow up visit.  CODE STATUS: FULL CODE PPS: 50%  HOSPICE ELIGIBILITY/DIAGNOSIS: TBD  PAST MEDICAL HISTORY:  Past Medical History:  Diagnosis Date  . B12 deficiency 04/05/2017  . Blood in stool   . Cerebral aneurysm   . Chronic kidney disease   . Diabetes mellitus without complication ([place])   . Gastric erosion with bleeding   . Hyperlipidemia   . Hypertension   . Lower extremity cellulitis 02/08/2019  . Seizure disorder [facility])     SOCIAL HX:  Social History   Tobacco Use  . Smoking status: Never Smoker  . Smokeless tobacco: Current User    Types: Snuff  Substance Use Topics  . Alcohol use: Not Currently    Alcohol/week: 14.0 standard drinks    Types: 14 Cans of beer per week    Comment: patient quit in october 2020   FAMILY HX:  Family History  Problem Relation Age of Onset  . Heart attack Brother 46    ALLERGIES:  Allergies  Allergen Reactions  . Aspirin Other (See Comments)  Nose bleed     PERTINENT MEDICATIONS:  Outpatient Encounter Medications as of 11/15/2019  Medication Sig  . allopurinol (ZYLOPRIM) 100 MG tablet Take 1 tablet (100 mg total) by mouth daily.  Marland Kitchen apixaban (ELIQUIS) 5 MG TABS tablet Take 1 tablet (5 mg total) by mouth 2 (two) times daily.  Marland Kitchen diltiazem (CARTIA XT) 240 MG 24 hr capsule Take 1 capsule (240 mg total) by mouth daily.  Marland Kitchen escitalopram (LEXAPRO) 10 MG tablet Take 1 tablet (10 mg total) by  mouth daily. For anxiety/sleep  . furosemide (LASIX) 20 MG tablet Take 1 tablet (20 mg total) by mouth daily.  Marland Kitchen glimepiride (AMARYL) 1 MG tablet Take 1 tablet (1 mg total) by mouth every morning.  . metFORMIN (GLUCOPHAGE) 500 MG tablet Take 1 tablet (500 mg total) by mouth daily with breakfast.  . metoprolol tartrate (LOPRESSOR) 25 MG tablet Take 25 mg by mouth daily.  Marland Kitchen spironolactone (ALDACTONE) 25 MG tablet Take 1 tablet (25 mg total) by mouth daily. For cirrhosis of liver  . traZODone (DESYREL) 50 MG tablet Take 0.5-1 tablets (25-50 mg total) by mouth at bedtime as needed for sleep.   No facility-administered encounter medications on file as of 11/15/2019.    PHYSICAL EXAM / ROS:  HR 78, RR 18 Current and past weights:  170 lbs General: NAD, frail appearing, WNWD Cardiovascular: S1S2 S3, no chest pain reported, no LE  edema  Pulmonary: ++ cough, no increased SOB, room air, Lungs CTA,  Abdomen: appetite good, denies constipation, continent of bowel GU: denies dysuria, continent of urine MSK:  no joint and ROM abnormalities, ambulatory in home, no recent falls Skin: no rashes or wounds reported Neurological: Weakness, endorses positional pain in L and R upper quadrants of abdomen. Insomnia due to worrying about safety in her apt.  Jason Coop, NP , DNP, MPH, [facility]  COVID-19 PATIENT SCREENING TOOL  Person answering questions: ___________self______ _____   1.  Is the patient or any family member in the home showing any signs or symptoms regarding respiratory infection?               Person with Symptom- __________NA_________________  a. Fever                                                                          Yes___ No___          ___________________  b. Shortness of breath                                                    Yes___ No___          ___________________ c. Cough/congestion                                       Yes___  No___          ___________________ d. Body aches/pains  Yes___ No___        ____________________ e. Gastrointestinal symptoms (diarrhea, nausea)           Yes___ No___        ____________________  2. Within the past 14 days, has anyone living in the home had any contact with someone with or under investigation for COVID-19?    Yes___ No_X_   Person __________________

## 2019-11-20 ENCOUNTER — Other Ambulatory Visit: Payer: Self-pay

## 2019-11-22 ENCOUNTER — Other Ambulatory Visit: Payer: Self-pay

## 2019-11-22 ENCOUNTER — Inpatient Hospital Stay: Payer: Medicare Other | Attending: Oncology

## 2019-11-22 DIAGNOSIS — E538 Deficiency of other specified B group vitamins: Secondary | ICD-10-CM | POA: Diagnosis not present

## 2019-11-22 DIAGNOSIS — E785 Hyperlipidemia, unspecified: Secondary | ICD-10-CM | POA: Diagnosis not present

## 2019-11-22 DIAGNOSIS — E11649 Type 2 diabetes mellitus with hypoglycemia without coma: Secondary | ICD-10-CM | POA: Insufficient documentation

## 2019-11-22 DIAGNOSIS — C220 Liver cell carcinoma: Secondary | ICD-10-CM | POA: Insufficient documentation

## 2019-11-22 DIAGNOSIS — Z79899 Other long term (current) drug therapy: Secondary | ICD-10-CM | POA: Diagnosis not present

## 2019-11-22 DIAGNOSIS — I10 Essential (primary) hypertension: Secondary | ICD-10-CM | POA: Insufficient documentation

## 2019-11-22 DIAGNOSIS — Z8249 Family history of ischemic heart disease and other diseases of the circulatory system: Secondary | ICD-10-CM | POA: Insufficient documentation

## 2019-11-22 DIAGNOSIS — K7460 Unspecified cirrhosis of liver: Secondary | ICD-10-CM | POA: Diagnosis not present

## 2019-11-22 LAB — CBC WITH DIFFERENTIAL/PLATELET
Abs Immature Granulocytes: 0.01 10*3/uL (ref 0.00–0.07)
Basophils Absolute: 0 10*3/uL (ref 0.0–0.1)
Basophils Relative: 0 %
Eosinophils Absolute: 0.1 10*3/uL (ref 0.0–0.5)
Eosinophils Relative: 2 %
HCT: 38.8 % (ref 36.0–46.0)
Hemoglobin: 12.8 g/dL (ref 12.0–15.0)
Immature Granulocytes: 0 %
Lymphocytes Relative: 20 %
Lymphs Abs: 1.2 10*3/uL (ref 0.7–4.0)
MCH: 30.5 pg (ref 26.0–34.0)
MCHC: 33 g/dL (ref 30.0–36.0)
MCV: 92.6 fL (ref 80.0–100.0)
Monocytes Absolute: 0.4 10*3/uL (ref 0.1–1.0)
Monocytes Relative: 7 %
Neutro Abs: 4.3 10*3/uL (ref 1.7–7.7)
Neutrophils Relative %: 71 %
Platelets: 139 10*3/uL — ABNORMAL LOW (ref 150–400)
RBC: 4.19 MIL/uL (ref 3.87–5.11)
RDW: 12.8 % (ref 11.5–15.5)
WBC: 6 10*3/uL (ref 4.0–10.5)
nRBC: 0 % (ref 0.0–0.2)

## 2019-11-22 LAB — COMPREHENSIVE METABOLIC PANEL
ALT: 13 U/L (ref 0–44)
AST: 24 U/L (ref 15–41)
Albumin: 3.7 g/dL (ref 3.5–5.0)
Alkaline Phosphatase: 102 U/L (ref 38–126)
Anion gap: 12 (ref 5–15)
BUN: 24 mg/dL — ABNORMAL HIGH (ref 8–23)
CO2: 26 mmol/L (ref 22–32)
Calcium: 9.1 mg/dL (ref 8.9–10.3)
Chloride: 99 mmol/L (ref 98–111)
Creatinine, Ser: 1.26 mg/dL — ABNORMAL HIGH (ref 0.44–1.00)
GFR calc Af Amer: 51 mL/min — ABNORMAL LOW (ref >60)
GFR calc non Af Amer: 44 mL/min — ABNORMAL LOW (ref >60)
Glucose, Bld: 195 mg/dL — ABNORMAL HIGH (ref 70–99)
Potassium: 3.4 mmol/L — ABNORMAL LOW (ref 3.5–5.1)
Sodium: 137 mmol/L (ref 135–145)
Total Bilirubin: 0.6 mg/dL (ref 0.3–1.2)
Total Protein: 8.7 g/dL — ABNORMAL HIGH (ref 6.5–8.1)

## 2019-11-23 ENCOUNTER — Inpatient Hospital Stay (HOSPITAL_BASED_OUTPATIENT_CLINIC_OR_DEPARTMENT_OTHER): Payer: Medicare Other | Admitting: Hospice and Palliative Medicine

## 2019-11-23 ENCOUNTER — Inpatient Hospital Stay (HOSPITAL_BASED_OUTPATIENT_CLINIC_OR_DEPARTMENT_OTHER): Payer: Medicare Other | Admitting: Oncology

## 2019-11-23 ENCOUNTER — Encounter: Payer: Self-pay | Admitting: Oncology

## 2019-11-23 VITALS — BP 119/74 | HR 59 | Temp 98.3°F | Resp 18 | Wt 172.2 lb

## 2019-11-23 DIAGNOSIS — Z8739 Personal history of other diseases of the musculoskeletal system and connective tissue: Secondary | ICD-10-CM | POA: Diagnosis not present

## 2019-11-23 DIAGNOSIS — E538 Deficiency of other specified B group vitamins: Secondary | ICD-10-CM | POA: Diagnosis not present

## 2019-11-23 DIAGNOSIS — C220 Liver cell carcinoma: Secondary | ICD-10-CM

## 2019-11-23 DIAGNOSIS — I10 Essential (primary) hypertension: Secondary | ICD-10-CM | POA: Diagnosis not present

## 2019-11-23 DIAGNOSIS — K7460 Unspecified cirrhosis of liver: Secondary | ICD-10-CM

## 2019-11-23 DIAGNOSIS — Z515 Encounter for palliative care: Secondary | ICD-10-CM | POA: Diagnosis not present

## 2019-11-23 DIAGNOSIS — Z79899 Other long term (current) drug therapy: Secondary | ICD-10-CM | POA: Diagnosis not present

## 2019-11-23 DIAGNOSIS — E11649 Type 2 diabetes mellitus with hypoglycemia without coma: Secondary | ICD-10-CM | POA: Diagnosis not present

## 2019-11-23 DIAGNOSIS — E785 Hyperlipidemia, unspecified: Secondary | ICD-10-CM | POA: Diagnosis not present

## 2019-11-23 DIAGNOSIS — Z8249 Family history of ischemic heart disease and other diseases of the circulatory system: Secondary | ICD-10-CM | POA: Diagnosis not present

## 2019-11-23 LAB — AFP TUMOR MARKER: AFP, Serum, Tumor Marker: 27.2 ng/mL — ABNORMAL HIGH (ref 0.0–8.3)

## 2019-11-23 NOTE — Progress Notes (Signed)
[facility]  [phone] [phone]   Name: Adriana Miller Date: 11/23/2019 MRN: [id number]  DOB: 12-13-1955  Patient Care Team: Ronnell Freshwater, NP as PCP - General (Family Medicine) End, Harrell Gave, MD as PCP - Cardiology (Cardiology) Clent Jacks, RN as Oncology Nurse Navigator End, Harrell Gave, MD as Consulting Physician (Cardiology) Jason Coop, NP as Nurse Practitioner (Hospice and Palliative Medicine) Valetta Mulroy, Kirt Boys, NP as Nurse Practitioner (Hospice and Palliative Medicine)    REASON FOR CONSULTATION: Adriana Miller is a 64 y.o. female with multiple medical problems including [place], cirrhosis secondary to alcohol abuse, history of gastric erosion with bleeding, CKD, history of poorly controlled diabetes, cerebral aneurysm, and seizure disorder.  Patient's HCC was deemed not surgically resectable nor was patient felt to be a transplant candidate.  Patient was referred for consideration of embolization.  Patient has relatively poor performance status with limited social support.  Appear to help address goals and manage ongoing symptoms.  SOCIAL HISTORY:     reports that she has never smoked. Her smokeless tobacco use includes snuff. She reports previous alcohol use of about 14.0 standard drinks of alcohol per week. She reports that she does not use drugs.   Patient is divorced.  She lives at home alone.  She has a Medicaid CNA 2 hours/day.  She has a brother who lives nearby.  She has a son and granddaughter and [place].  Patient is originally from [place] but moved here about 7 years ago to be near her brother.  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  ADVANCE DIRECTIVES:  Does not have  CODE STATUS: DNR  PAST MEDICAL HISTORY: Past Medical History:  Diagnosis Date  . B12 deficiency 04/05/2017  . Blood in stool   . Cerebral aneurysm   . Chronic  kidney disease   . Diabetes mellitus without complication ([place])   . Gastric erosion with bleeding   . Hyperlipidemia   . Hypertension   . Lower extremity cellulitis 02/08/2019  . Seizure disorder ([place])     PAST SURGICAL HISTORY:  Past Surgical History:  Procedure Laterality Date  . CEREBRAL ANEURYSM REPAIR  1991  . COLONOSCOPY WITH PROPOFOL N/A 02/12/2019   Procedure: COLONOSCOPY WITH PROPOFOL;  Surgeon: Lin Landsman, MD;  [facility] END[facility]COPY;  Service: Gastroenterology;  Laterality: N/A;  . ESOPHAGOGASTRODUODENOSCOPY (EGD) WITH PROPOFOL N/A 02/12/2019   Procedure: ESOPHAGOGASTRODUODENOSCOPY (EGD) WITH PROPOFOL;  Surgeon: Lin Landsman, MD;  Location: [facility] ENDOSCOPY;  Service: Gastroenterology;  Laterality: N/A;  . IR ANGIOGRAM SELECTIVE EACH ADDITIONAL VESSEL  09/21/2019  . IR ANGIOGRAM SELECTIVE EACH ADDITIONAL VESSEL  09/21/2019  . IR ANGIOGRAM SELECTIVE EACH ADDITIONAL VESSEL  09/21/2019  . IR ANGIOGRAM SELECTIVE EACH ADDITIONAL VESSEL  09/21/2019  . IR ANGIOGRAM SELECTIVE EACH ADDITIONAL VESSEL  10/02/2019  . IR ANGIOGRAM VISCERAL SELECTIVE  09/21/2019  . IR ANGIOGRAM VISCERAL SELECTIVE  09/21/2019  . IR ANGIOGRAM VISCERAL SELECTIVE  10/02/2019  . IR EMBO TUMOR ORGAN ISCHEMIA INFARCT INC GUIDE ROADMAPPING  10/02/2019  . IR EMBO TUMOR ORGAN ISCHEMIA INFARCT INC GUIDE ROADMAPPING  10/02/2019  . IR RADIOLOGIST EVAL & MGMT  08/28/2019  . IR US GUIDE VASC ACCESS LEFT  09/21/2019  . IR US GUIDE VASC ACCESS LEFT  10/02/2019    HEMATOLOGY/ONCOLOGY HISTORY:  Oncology History   No history exists.    ALLERGIES:  is allergic to aspirin.  MEDICATIONS:  Current  Outpatient Medications  Medication Sig Dispense Refill  . allopurinol (ZYLOPRIM) 100 MG tablet Take 1 tablet (100 mg total) by mouth daily. 90 tablet 1  . apixaban (ELIQUIS) 5 MG TABS tablet Take 1 tablet (5 mg total) by mouth 2 (two) times daily. 180 tablet 2  . diltiazem (CARTIA XT) 240 MG 24 hr capsule Take 1 capsule (240  mg total) by mouth daily. 30 capsule 11  . escitalopram (LEXAPRO) 10 MG tablet Take 1 tablet (10 mg total) by mouth daily. For anxiety/sleep 90 tablet 2  . furosemide (LASIX) 20 MG tablet Take 1 tablet (20 mg total) by mouth daily. 30 tablet 0  . glimepiride (AMARYL) 1 MG tablet Take 1 tablet (1 mg total) by mouth every morning. 90 tablet 0  . metFORMIN (GLUCOPHAGE) 500 MG tablet Take 1 tablet (500 mg total) by mouth daily with breakfast. 90 tablet 0  . metoprolol tartrate (LOPRESSOR) 25 MG tablet Take 25 mg by mouth daily.    Marland Kitchen spironolactone (ALDACTONE) 25 MG tablet Take 1 tablet (25 mg total) by mouth daily. For cirrhosis of liver 90 tablet 2  . traZODone (DESYREL) 50 MG tablet Take 0.5-1 tablets (25-50 mg total) by mouth at bedtime as needed for sleep. 30 tablet 2   No current facility-administered medications for this visit.    VITAL SIGNS: There were no vitals taken for this visit. There were no vitals filed for this visit.  Estimated body mass index is 27.79 kg/m as calculated from the following:   Height as of 11/01/19: 5\' 6"  (1.676 m).   Weight as of an earlier encounter on 11/23/19: 172 lb 3.2 oz (78.1 kg).  LABS: CBC:    Component Value Date/Time   WBC 6.0 11/22/2019 1308   HGB 12.8 11/22/2019 1308   HGB 12.8 06/22/2019 1546   HCT 38.8 11/22/2019 1308   HCT 36.8 06/22/2019 1546   PLT 139 (L) 11/22/2019 1308   PLT 173 06/22/2019 1546   MCV 92.6 11/22/2019 1308   MCV 92 06/22/2019 1546   MCV 98 10/11/2013 1705   NEUTROABS 4.3 11/22/2019 1308   NEUTROABS 3.6 06/22/2019 1546   LYMPHSABS 1.2 11/22/2019 1308   LYMPHSABS 1.4 06/22/2019 1546   MONOABS 0.4 11/22/2019 1308   EOSABS 0.1 11/22/2019 1308   EOSABS 0.1 06/22/2019 1546   BASOSABS 0.0 11/22/2019 1308   BASOSABS 0.0 06/22/2019 1546   Comprehensive Metabolic Panel:    Component Value Date/Time   NA 137 11/22/2019 1308   NA 140 06/06/2019 1538   NA 138 10/11/2013 1705   K 3.4 (L) 11/22/2019 1308   K 3.9  10/11/2013 1705   CL 99 11/22/2019 1308   CL 106 10/11/2013 1705   CO2 26 11/22/2019 1308   CO2 24 10/11/2013 1705   BUN 24 (H) 11/22/2019 1308   BUN 24 06/06/2019 1538   BUN 13 10/11/2013 1705   CREATININE 1.26 (H) 11/22/2019 1308   CREATININE 0.87 10/11/2013 1705   GLUCOSE 195 (H) 11/22/2019 1308   GLUCOSE 235 (H) 10/11/2013 1705   CALCIUM 9.1 11/22/2019 1308   CALCIUM 8.4 (L) 10/11/2013 1705   AST 24 11/22/2019 1308   ALT 13 11/22/2019 1308   ALKPHOS 102 11/22/2019 1308   BILITOT 0.6 11/22/2019 1308   BILITOT 0.5 01/27/2018 1340   PROT 8.7 (H) 11/22/2019 1308   PROT 8.4 01/27/2018 1340   ALBUMIN 3.7 11/22/2019 1308   ALBUMIN 4.2 01/27/2018 1340    RADIOGRAPHIC STUDIES: No results found.  PERFORMANCE  STATUS (ECOG) : 2 - Symptomatic, <50% confined to bed  Review of Systems Unless otherwise noted, a complete review of systems is negative.  Physical Exam General: NAD, frail appearing Pulmonary: Unlabored Extremities: no edema, no joint deformities Skin: no rashes Neurological: Weakness but otherwise nonfocal  IMPRESSION: Routine follow-up visit.  Patient reports that she is doing well.  She denies any significant changes or concerns today.  She denies any symptomatic complaints.  She continues to receive help from a CNA at home 2 hours each day.  Appreciate home-based palliative care support.  Patient endorses stable appetite.  She describes tolerating trazodone well.  It is helping her sleep.  Case discussed with Dr. Tasia Catchings.  AFP is slowly downtrending.  Plan is to obtain MRI of the abdomen to better characterize cancer.   PLAN: -Best supportive care -Continue trazodone 25 to 50 mg nightly as needed for insomnia -Patient verbalized a desire to be a DNR -RTC 1 to 2 months   Patient expressed understanding and was in agreement with this plan. She also understands that She can call the clinic at any time with any questions, concerns, or complaints.     Time  Total: 15 minutes  Visit consisted of counseling and education dealing with the complex and emotionally intense issues of symptom management and palliative care in the setting of serious and potentially life-threatening illness.Greater than 50%  of this time was spent counseling and coordinating care related to the above assessment and plan.  Signed by: Altha Harm, PhD, NP-C

## 2019-11-23 NOTE — Progress Notes (Signed)
Hematology/Oncology follow up  [facility] [phone] [phone]   Patient Care Team: Ronnell Freshwater, NP as PCP - General (Family Medicine) End, Harrell Gave, MD as PCP - Cardiology (Cardiology) Clent Jacks, RN as Oncology Nurse Navigator End, Harrell Gave, MD as Consulting Physician (Cardiology) Jason Coop, NP as Nurse Practitioner (Hospice and Palliative Medicine) Borders, Kirt Boys, NP as Nurse Practitioner (Hospice and Palliative Medicine)  REFERRING PROVIDER: Ronnell Freshwater, NP  CHIEF COMPLAINTS/REASON FOR VISIT:  Follow-up for [facility] HISTORY OF PRESENTING ILLNESS:   Adriana Miller is a  64 y.o.  female with PMH listed below was seen in consultation at the request of  Ronnell Freshwater, NP  for evaluation of elevated protein Patient has a history of alcoholic cirrhosis.  Recent admission due to bloody diarrhea and anemia due to acute blood loss.  She underwent EGD and colonoscopy.  EGD showed gastric bleeding erosion which was cauterized.  Evidence of portal hypertensive gastropathy.  Colonoscopy showed 2 small polyps that were removed. She has stopped drinking alcohol  Her blood work on 03/28/2019 showed protein level of 9.3, total bilirubin 2.4, # 07/23/2019 MRI liver done which showed 2 liver masses both with avid hyper enhancement, both considered LR-5 (definitely [place]).  I referred patient to have been in consultation with Dr. Sheilah Pigeon surgical oncology at [facility] for resectability. Patient was seen by Dr. Maudie Mercury on 08/20/2019.  Patient was deemed not a surgical candidate due to her multiple comorbidities, functional status.  She is not a transplant candidate due to her comorbidities and functional status.  Additionally it appears that patient does not want any major abdominal operations.  Patient was recommended to have local regional therapy.  Patient prefers to stay local to proceed with her therapy.      INTERVAL  HISTORY Adriana Miller is a 64 y.o. female who has above history reviewed by me today presents for follow up visit for management of [place].  #10/02/2019, patient underwent Y90 hepatic radioembolization Today she reports feeling well.  Chronic fatigue is at baseline.  Appetite is fair. She reports occasionally right upper quadrant pain with bending.  Pain has resolved spontaneously.  Her weight is stable.    Review of Systems  Constitutional: Positive for fatigue. Negative for appetite change, chills, fever and unexpected weight change.  HENT:   Negative for hearing loss and voice change.   Eyes: Negative for eye problems.  Respiratory: Negative for chest tightness and cough.   Cardiovascular: Negative for chest pain.  Gastrointestinal: Negative for abdominal distention, abdominal pain and blood in stool.  Endocrine: Negative for hot flashes.  Genitourinary: Negative for difficulty urinating and frequency.   Musculoskeletal: Positive for arthralgias.  Skin: Negative for itching and rash.  Neurological: Negative for extremity weakness. Speech difficulty:    Hematological: Negative for adenopathy. Bruises/bleeds easily.  Psychiatric/Behavioral: Negative for confusion.    MEDICAL HISTORY:  Past Medical History:  Diagnosis Date  . B12 deficiency 04/05/2017  . Blood in stool   . Cerebral aneurysm   . Chronic kidney disease   . Diabetes mellitus without complication ([place])   . Gastric erosion with bleeding   . Hyperlipidemia   . Hypertension   . Lower extremity cellulitis 02/08/2019  . Seizure disorder [facility])     SURGICAL HISTORY: Past Surgical History:  Procedure Laterality Date  . CEREBRAL ANEURYSM REPAIR  1991  . COLONOSCOPY WITH PROPOFOL N/A 02/12/2019   Procedure: COLONOSCOPY WITH PROPOFOL;  Surgeon: Sherri Sear  Reece Levy, MD;  Location: [place];  Service: Gastroenterology;  Laterality: N/A;  . ESOPHAGOGASTRODUODENOSCOPY (EGD) WITH PROPOFOL N/A 02/12/2019   Procedure:  ESOPHAGOGASTRODUODENOSCOPY (EGD) WITH PROPOFOL;  Surgeon: Lin Landsman, MD;  Location: [facility] ENDOSCOPY;  Service: Gastroenterology;  Laterality: N/A;  . IR ANGIOGRAM SELECTIVE EACH ADDITIONAL VESSEL  09/21/2019  . IR ANGIOGRAM SELECTIVE EACH ADDITIONAL VESSEL  09/21/2019  . IR ANGIOGRAM SELECTIVE EACH ADDITIONAL VESSEL  09/21/2019  . IR ANGIOGRAM SELECTIVE EACH ADDITIONAL VESSEL  09/21/2019  . IR ANGIOGRAM SELECTIVE EACH ADDITIONAL VESSEL  10/02/2019  . IR ANGIOGRAM VISCERAL SELECTIVE  09/21/2019  . IR ANGIOGRAM VISCERAL SELECTIVE  09/21/2019  . IR ANGIOGRAM VISCERAL SELECTIVE  10/02/2019  . IR EMBO TUMOR ORGAN ISCHEMIA INFARCT INC GUIDE ROADMAPPING  10/02/2019  . IR EMBO TUMOR ORGAN ISCHEMIA INFARCT INC GUIDE ROADMAPPING  10/02/2019  . IR RADIOLOGIST EVAL & MGMT  08/28/2019  . IR US GUIDE VASC ACCESS LEFT  09/21/2019  . IR US GUIDE VASC ACCESS LEFT  10/02/2019    SOCIAL HISTORY: Social History   Socioeconomic History  . Marital status: Divorced    Spouse name: Not on file  . Number of children: Not on file  . Years of education: Not on file  . Highest education level: Not on file  Occupational History  . Not on file  Tobacco Use  . Smoking status: Never Smoker  . Smokeless tobacco: Current User    Types: Snuff  Vaping Use  . Vaping Use: Never used  Substance and Sexual Activity  . Alcohol use: Not Currently    Alcohol/week: 14.0 standard drinks    Types: 14 Cans of beer per week    Comment: patient quit in october 2020  . Drug use: No  . Sexual activity: Not Currently  Other Topics Concern  . Not on file  Social History Narrative  . Not on file   Social Determinants of Health   Financial Resource Strain:   . Difficulty of Paying Living Expenses:   Food Insecurity:   . Worried About Charity fundraiser in the Last Year:   . Arboriculturist in the Last Year:   Transportation Needs:   . Film/video editor (Medical):   Marland Kitchen Lack of Transportation (Non-Medical):   Physical  Activity:   . Days of Exercise per Week:   . Minutes of Exercise per Session:   Stress:   . Feeling of Stress :   Social Connections:   . Frequency of Communication with Friends and Family:   . Frequency of Social Gatherings with Friends and Family:   . Attends Religious Services:   . Active Member of Clubs or Organizations:   . Attends Archivist Meetings:   Marland Kitchen Marital Status:   Intimate Partner Violence:   . Fear of Current or Ex-Partner:   . Emotionally Abused:   Marland Kitchen Physically Abused:   . Sexually Abused:     FAMILY HISTORY: Family History  Problem Relation Age of Onset  . Heart attack Brother 42    ALLERGIES:  is allergic to aspirin.  MEDICATIONS:  Current Outpatient Medications  Medication Sig Dispense Refill  . allopurinol (ZYLOPRIM) 100 MG tablet Take 1 tablet (100 mg total) by mouth daily. 90 tablet 1  . apixaban (ELIQUIS) 5 MG TABS tablet Take 1 tablet (5 mg total) by mouth 2 (two) times daily. 180 tablet 2  . diltiazem (CARTIA XT) 240 MG 24 hr capsule Take 1 capsule (240 mg total) by  mouth daily. 30 capsule 11  . escitalopram (LEXAPRO) 10 MG tablet Take 1 tablet (10 mg total) by mouth daily. For anxiety/sleep 90 tablet 2  . furosemide (LASIX) 20 MG tablet Take 1 tablet (20 mg total) by mouth daily. 30 tablet 0  . glimepiride (AMARYL) 1 MG tablet Take 1 tablet (1 mg total) by mouth every morning. 90 tablet 0  . metFORMIN (GLUCOPHAGE) 500 MG tablet Take 1 tablet (500 mg total) by mouth daily with breakfast. 90 tablet 0  . metoprolol tartrate (LOPRESSOR) 25 MG tablet Take 25 mg by mouth daily.    Marland Kitchen spironolactone (ALDACTONE) 25 MG tablet Take 1 tablet (25 mg total) by mouth daily. For cirrhosis of liver 90 tablet 2  . traZODone (DESYREL) 50 MG tablet Take 0.5-1 tablets (25-50 mg total) by mouth at bedtime as needed for sleep. 30 tablet 2   No current facility-administered medications for this visit.     PHYSICAL EXAMINATION: ECOG PERFORMANCE STATUS: 1 -  Symptomatic but completely ambulatory Vitals:   11/23/19 1042  BP: 119/74  Pulse: (!) 59  Resp: 18  Temp: 98.3 F (36.8 C)   Filed Weights   11/23/19 1042  Weight: 172 lb 3.2 oz (78.1 kg)    Physical Exam Constitutional:      General: She is not in acute distress.    Comments: Patient states in the wheelchair.  HENT:     Head: Normocephalic and atraumatic.  Eyes:     General: No scleral icterus. Cardiovascular:     Rate and Rhythm: Normal rate and regular rhythm.     Heart sounds: Normal heart sounds.  Pulmonary:     Effort: Pulmonary effort is normal. No respiratory distress.     Breath sounds: No wheezing.  Abdominal:     General: Bowel sounds are normal. There is no distension.     Palpations: Abdomen is soft.  Musculoskeletal:        General: No tenderness or deformity. Normal range of motion.     Cervical back: Normal range of motion and neck supple.  Skin:    General: Skin is warm and dry.     Findings: No erythema or rash.  Neurological:     Mental Status: She is alert and oriented to person, place, and time. Mental status is at baseline.     Cranial Nerves: No cranial nerve deficit.     Coordination: Coordination normal.  Psychiatric:        Mood and Affect: Mood normal.     LABORATORY DATA:  I have reviewed the data as listed Lab Results  Component Value Date   WBC 6.0 11/22/2019   HGB 12.8 11/22/2019   HCT 38.8 11/22/2019   MCV 92.6 11/22/2019   PLT 139 (L) 11/22/2019   Recent Labs    07/26/19 0807 07/26/19 0807 09/21/19 0900 10/02/19 0840 11/22/19 1308  NA 132*   < > 139 141 137  K 3.8   < > 4.0 3.8 3.4*  CL 102   < > 102 103 99  CO2 22   < > 25 27 26   GLUCOSE 312*   < > 126* 147* 195*  BUN 20   < > 25* 20 24*  CREATININE 1.20*   < > 1.00 1.19* 1.26*  CALCIUM 8.7*   < > 9.5 9.4 9.1  GFRNONAA 47*   < > 59* 48* 44*  GFRAA 55*   < > >60 55* 51*  PROT 7.6  --   --  8.7* 8.7*  ALBUMIN 2.8*  --   --  3.6 3.7  AST 21  --   --  23 24  ALT  14  --   --  15 13  ALKPHOS 75  --   --  89 102  BILITOT 1.3*   < > 1.0 0.6 0.6   < > = values in this interval not displayed.   Iron/TIBC/Ferritin/ %Sat    Component Value Date/Time   IRON 76 02/11/2019 0616   TIBC 122 (L) 02/11/2019 0616   FERRITIN 427 (H) 02/11/2019 0616   IRONPCTSAT 62 (H) 02/11/2019 4967      RADIOGRAPHIC STUDIES: I have personally reviewed the radiological images as listed and agreed with the findings in the report. NM LIVER TUMOR LOC IMFLAM SPECT 1 DAY  Result Date: 10/02/2019 CLINICAL DATA:  Hepatocellular carcinoma with several small lesions isolated to the LEFT hepatic lobe. Radiation segmentectomy to segments 2 and 3 of the LEFT hepatic lobe. EXAM: NUCLEAR MEDICINE SPECIAL MED RAD PHYSICS CONS; NUCLEAR MEDICINE RADIO PHARM THERAPY INTRA ARTERIAL; NUCLEAR MEDICINE TREATMENT PROCEDURE; NUCLEAR MEDICINE LIVER SCAN TECHNIQUE: In conjunction with the interventional radiologist a Y- Microsphere dose was calculated utilizing body surface area formulation and partition method for radiation segmentectomy. Calculated dose equal 22.7 mCi. Pre therapy MAA liver SPECT scan and CTA were evaluated. Utilizing a microcatheter system, the LEFT hepatic artery was selected and Y-90 microspheres were delivered in fractionated aliquots. The dose was split between the segment 2 and segment 3 hepatic arteries. Radiopharmaceutical was delivered by the interventional radiologist and nuclear radiologist. The patient tolerated procedure well. No adverse effects were noted. Bremsstrahlung planar and SPECT imaging of the abdomen following intrahepatic arterial delivery of Y-90 microsphere was performed. RADIOPHARMACEUTICALS:  59.16 millicuries yttrium 90 microspheres. COMPARISON:  MAA scan 09/21/2019, MRI abdomen 07/23/2019 FINDINGS: Y - 90 microspheres therapy as above. First therapy the LEFT hepatic lobe. Radiation segmentectomy to segments 2 and 3 as above. Bremsstrahlung planar and SPECT imaging  of the abdomen following intrahepatic arterial delivery of Y-62microsphere demonstrates radioactivity localized to the LEFT hepatic lobe. No evidence of extrahepatic activity. IMPRESSION: Successful Y - 90 microsphere delivery for treatment of unresectable liver metastasis. First therapy to the LEFT lobe. Intention, radiation segmentectomy segments 2 and 3. Bremssstrahlung scan demonstrates activity localized to LEFT hepatic lobe with no extrahepatic activity identified. Electronically Signed   By: Suzy Bouchard M.D.   On: 10/02/2019 13:13   NM LIVER TUMOR LOC IMFLAM SPECT 1 DAY  Result Date: 09/24/2019 CLINICAL DATA:  Hepatocellular carcinoma. EXAM: NUCLEAR MEDICINE LIVER SCAN TECHNIQUE: Abdominal images were obtained in multiple projections after intrahepatic arterial injection of radiopharmaceutical. SPECT imaging was performed. Lung shunt calculation was performed. RADIOPHARMACEUTICALS:  5.4 millicuries technetium MAA COMPARISON:  MRI 07/23/2019, CT 05/09/2019 FINDINGS: The injected microaggregated albumin localizes within the liver. 44 of radiotracer localizes to the LEFT hepatic lobe. No evidence of activity within the stomach, duodenum, or bowel. Calculated shunt fraction to the lungs equals 7.0%. IMPRESSION: 1. No significant extrahepatic radiotracer activity following intrahepatic arterial injection of MAA. 2. Majority of radiotracer localizes to the LEFT hepatic lobe. 3. Lung shunt fraction equals 7.0% Electronically Signed   By: Suzy Bouchard M.D.   On: 09/24/2019 11:13   IR Angiogram Visceral Selective  Result Date: 10/02/2019 INDICATION: 64 year old female with alcoholic cirrhosis complicated by multifocal hepatocellular carcinoma isolated to the lateral segments of the left hepatic lobe. She presents today for trans arterial radioembolization. EXAM: IR EMBO TUMOR ORGAN ISCHEMIA  INFARCT INC GUIDE ROADMAPPING; SELECTIVE VISCERAL ARTERIOGRAPHY; ADDITIONAL ARTERIOGRAPHY; IR ULTRASOUND  GUIDANCE VASC ACCESS LEFT 1. Ultrasound-guided vascular access left radial artery 2. Catheterization of the superior mesenteric artery with arteriogram 3. Catheterization of the common hepatic artery with arteriogram 4. Catheterization of the common trunk of the segment 3/4 hepatic artery 5. Catheterization of the segment 3 hepatic artery 6. Trans arterial radiation segmentectomy of hepatic segment 3 7. Catheterization of the segment 2 hepatic artery 8. Trans arterial radiation segmentectomy of hepatic segment 2 MEDICATIONS: 3.375 g Zosyn. The antibiotic was administered within 1 hour of the procedure ANESTHESIA/SEDATION: Moderate (conscious) sedation was employed during this procedure. A total of Versed 2 mg and Fentanyl 100 mcg was administered intravenously. Moderate Sedation Time: 65 minutes. The patient's level of consciousness and vital signs were monitored continuously by radiology nursing throughout the procedure under my direct supervision. CONTRAST:  34mL OMNIPAQUE IOHEXOL 300 MG/ML SOLN, 17mL OMNIPAQUE IOHEXOL 300 MG/ML SOLN FLUOROSCOPY TIME:  Fluoroscopy Time: 9 minutes 24 seconds (1186 mGy). COMPLICATIONS: None immediate. PROCEDURE: Informed consent was obtained from the patient following explanation of the procedure, risks, benefits and alternatives. The patient understands, agrees and consents for the procedure. All questions were addressed. A time out was performed prior to the initiation of the procedure. Maximal barrier sterile technique utilized including caps, mask, sterile gowns, sterile gloves, large sterile drape, hand hygiene, and Betadine prep. The left radial artery was interrogated with ultrasound and found to be widely patent and of adequate diameter. An image was obtained and stored for the medical record. Local anesthesia was attained by infiltration with 1% lidocaine. Under real-time sonographic guidance, the vessel was punctured with a 21 gauge micropuncture needle. Using standard  technique, the initial micro needle was exchanged over a 0.021 micro wire for a hydrophilic 5 French slim arterial sheath. The sheath was flushed and then a radial cocktail consisting of 5000 units heparin, 2.5 mg Verapamil and 200 mcg nitroglycerine was injected through the sheath. An ultimate 1 catheter was advanced over a Bentson wire into the abdominal aorta. The superior mesenteric artery was successfully catheterized with the catheter. Arteriography was performed identifying the origin of the replaced common hepatic artery. Utilizing a Glidewire, the common hepatic artery was successfully catheterized. Arteriography was then performed to identify the origins of the segment 2 and segment 3/4 hepatic arteries. A renegade hi Flo microcatheter was introduced over a Fathom 16 wire and advanced into the common trunk of the segment 3/4 hepatic artery. Additional arteriography was then performed in different obliquities in order to separate the branching pattern between hepatic segments 3 and 4. The microcatheter was successfully advanced beyond the origin of the segment 4 artery and into the segment 3 artery. Arteriography was performed confirming that the catheter is indeed within the segment 3 hepatic artery and identifying tumor blush within hepatic segment 3. The microcatheter was then fixed in place. Trans arterial radio embolization was then performed utilizing resin Y 90 microspheres. This was performed in small aliquots according to our standard protocol. Antegrade flow was confirmed within the vessel throughout the radioembolization. There was no stasis. The microcatheter was then removed and appropriately disposed of. A new Renegade high flow microcatheter was reintroduced through the 5 [place] base catheter and used to select the segment 2 artery. The microcatheter was advanced deeper into the segment 2 artery and arteriography was performed. Tumor blush was identified in hepatic segment 2. The  microcatheter was fixed in place. Trans arterial radio embolization was then performed  using our standard technique. Antegrade flow was confirmed in the segment 2 artery throughout the procedure. Stasis was not attained. The microcatheter was brought back into the 5 French base catheter and the catheter system was disposed of as a unit. The left radial vascular sheath was removed. Hemostasis was attained with the assistance of a TR band. IMPRESSION: 1. Successful trans arterial radiation segmentectomy of hepatic segment 3. 2. Successful transarterial radiation segmentectomy of hepatic segment 2. Signed, Criselda Peaches, MD, [place] [facility] Radiology Electronically Signed   By: Jacqulynn Cadet M.D.   On: 10/02/2019 12:37   IR Angiogram Visceral Selective  Result Date: 09/21/2019 INDICATION: 64 year old female with multifocal hepatocellular carcinoma isolated to the lateral segment of the left hepatic lobe. She presents for pre Y 90 arterial mapping. EXAM: 1. Ultrasound-guided access left radial artery 2. Catheterization of the celiac axis with arteriogram 3. Catheterization of the superior mesenteric artery with arteriogram 4. Catheterization of the replaced common hepatic artery with arteriogram 5. Catheterization of the segment 3/4 left hepatic artery with arteriogram 6. Catheterization of the segment 3 hepatic artery with arteriogram 7. Catheterization of the segment 2 left hepatic artery with arteriogram MEDICATIONS: None ANESTHESIA/SEDATION: Moderate (conscious) sedation was employed during this procedure. A total of Versed 2 mg and Fentanyl 100 mcg was administered intravenously. Moderate Sedation Time: 70 minutes. The patient's level of consciousness and vital signs were monitored continuously by radiology nursing throughout the procedure under my direct supervision. CONTRAST:  100 mL Isovue 300 FLUOROSCOPY TIME:  Fluoroscopy Time: 17 minutes 30  seconds (1,800 mGy). COMPLICATIONS: None immediate. PROCEDURE: Informed consent was obtained from the patient following explanation of the procedure, risks, benefits and alternatives. The patient understands, agrees and consents for the procedure. All questions were addressed. A time out was performed prior to the initiation of the procedure. Maximal barrier sterile technique utilized including caps, mask, sterile gowns, sterile gloves, large sterile drape, hand hygiene, and Betadine prep. The left radial artery was interrogated with ultrasound and found to be widely patent and of adequate diameter. An image was obtained and stored for the medical record. Local anesthesia was attained by infiltration with 1% lidocaine. Under real-time sonographic guidance, the vessel was punctured with a 21 gauge micropuncture needle. Using standard technique, the initial micro needle was exchanged over a 0.021 micro wire for a hydrophilic 5 French slim arterial sheath. The sheath was flushed and then a radial cocktail consisting of 5000 units heparin, 2.5 mg Verapamil and 200 mcg nitroglycerine was injected through the sheath. An ultimate 2 catheter was advanced over a Bentson wire into the abdominal aorta. The catheter was used to select the celiac artery. An arteriogram was performed. There is variant hepatic arterial anatomy. The celiac artery gives rise to the splenic artery, left gastric artery and dorsal pancreatic artery. No evidence of hepatic artery. The catheter was next used to select the origin of the superior mesenteric artery. Arteriography was performed. The common hepatic arteries completely replaced to the SMA. A glidewire was successfully advanced into the common hepatic artery and the ultimate 1 catheter advanced over the wire into the artery. Common hepatic arteriography was then performed in multiple obliquities. Prominent supra duodenal artery arising just distal to the gastroduodenal artery. The right gastric  artery is visualized coming off the proper hepatic artery. A renegade STC microcatheter was advanced over a Fathom 16 wire and used to select the segment 4/3 left hepatic artery. Arteriography was performed. Two hypervascular tumors  are present within the segment 3 distribution. The microcatheter was successfully navigated beyond the bifurcation into the segment 4 and 3 hepatic arteries and into the segment 3 hepatic artery. Additional arteriography was performed. Excellent tumor blush identified. No reflux. No stasis. No evidence of extrahepatic arterial supply. The microcatheter was then brought back into the proper hepatic artery. After several attempts, the segment 2 hepatic artery was successfully catheterized. Arteriography was performed demonstrating hypervascular tumor blush in 2 lesions. No evidence of reflux, stasis or extrahepatic arterial supply. The microcatheter was brought back into the proper hepatic artery. Extensive effort was then made to catheterize the right gastric artery, however this was ultimately unsuccessful. It was felt acceptable to leave the right gastric artery open as treatment would be fairly distal in the segment 2 and 3 hepatic arteries and the chance of reflux into the right gastric artery is near 0. Therefore, technetium labeled macro aggregated albumin was successfully injected into the left hepatic artery. The catheters were removed. Hemostasis was attained with the assistance of a TR band IMPRESSION: 1. 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 2. 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 3. Successful multivessel angiogram. There are 4 hypervascular lesions in the lateral segment of the left hemi liver, two in segment 2 and two in segment 3. PLAN: Return in 2 weeks for split dose segmentectomy targeting segments 2 and 3. Signed, Criselda Peaches, MD, [facility] [facility] Radiology Electronically Signed   By: Jacqulynn Cadet M.D.   On: 09/21/2019 16:25   IR Angiogram Visceral Selective  Result Date: 09/21/2019 INDICATION: 64 year old female with multifocal hepatocellular carcinoma isolated to the lateral segment of the left hepatic lobe. She presents for pre Y 90 arterial mapping. EXAM: 1. Ultrasound-guided access left radial artery 2. Catheterization of the celiac axis with arteriogram 3. Catheterization of the superior mesenteric artery with arteriogram 4. Catheterization of the replaced common hepatic artery with arteriogram 5. Catheterization of the segment 3/4 left hepatic artery with arteriogram 6. Catheterization of the segment 3 hepatic artery with arteriogram 7. Catheterization of the segment 2 left hepatic artery with arteriogram MEDICATIONS: None ANESTHESIA/SEDATION: Moderate (conscious) sedation was employed during this procedure. A total of Versed 2 mg and Fentanyl 100 mcg was administered intravenously. Moderate Sedation Time: 70 minutes. The patient's level of consciousness and vital signs were monitored continuously by radiology nursing throughout the procedure under my direct supervision. CONTRAST:  100 mL Isovue 300 FLUOROSCOPY TIME:  Fluoroscopy Time: 17 minutes 30 seconds (1,800 mGy). COMPLICATIONS: None immediate. PROCEDURE: Informed consent was obtained from the patient following explanation of the procedure, risks, benefits and alternatives. The patient understands, agrees and consents for the procedure. All questions were addressed. A time out was performed prior to the initiation of the procedure. Maximal barrier sterile technique utilized including caps, mask, sterile gowns, sterile gloves, large sterile drape, hand hygiene, and Betadine prep. The left radial artery was interrogated with ultrasound and found to be widely patent and of adequate diameter. An image was obtained and stored for the medical  record. Local anesthesia was attained by infiltration with 1% lidocaine. Under real-time sonographic guidance, the vessel was punctured with a 21 gauge micropuncture needle. Using standard technique, the initial micro needle was exchanged over a 0.021 micro wire for a hydrophilic 5 French slim arterial sheath. The sheath was flushed and then a radial cocktail consisting  of 5000 units heparin, 2.5 mg Verapamil and 200 mcg nitroglycerine was injected through the sheath. An ultimate 2 catheter was advanced over a Bentson wire into the abdominal aorta. The catheter was used to select the celiac artery. An arteriogram was performed. There is variant hepatic arterial anatomy. The celiac artery gives rise to the splenic artery, left gastric artery and dorsal pancreatic artery. No evidence of hepatic artery. The catheter was next used to select the origin of the superior mesenteric artery. Arteriography was performed. The common hepatic arteries completely replaced to the SMA. A glidewire was successfully advanced into the common hepatic artery and the ultimate 1 catheter advanced over the wire into the artery. Common hepatic arteriography was then performed in multiple obliquities. Prominent supra duodenal artery arising just distal to the gastroduodenal artery. The right gastric artery is visualized coming off the proper hepatic artery. A renegade STC microcatheter was advanced over a Fathom 16 wire and used to select the segment 4/3 left hepatic artery. Arteriography was performed. Two hypervascular tumors are present within the segment 3 distribution. The microcatheter was successfully navigated beyond the bifurcation into the segment 4 and 3 hepatic arteries and into the segment 3 hepatic artery. Additional arteriography was performed. Excellent tumor blush identified. No reflux. No stasis. No evidence of extrahepatic arterial supply. The microcatheter was then brought back into the proper hepatic artery. After several  attempts, the segment 2 hepatic artery was successfully catheterized. Arteriography was performed demonstrating hypervascular tumor blush in 2 lesions. No evidence of reflux, stasis or extrahepatic arterial supply. The microcatheter was brought back into the proper hepatic artery. Extensive effort was then made to catheterize the right gastric artery, however this was ultimately unsuccessful. It was felt acceptable to leave the right gastric artery open as treatment would be fairly distal in the segment 2 and 3 hepatic arteries and the chance of reflux into the right gastric artery is near 0. Therefore, technetium labeled macro aggregated albumin was successfully injected into the left hepatic artery. The catheters were removed. Hemostasis was attained with the assistance of a TR band IMPRESSION: 1. 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 2. 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 3. Successful multivessel angiogram. There are 4 hypervascular lesions in the lateral segment of the left hemi liver, two in segment 2 and two in segment 3. PLAN: Return in 2 weeks for split dose segmentectomy targeting segments 2 and 3. Signed, Criselda Peaches, MD, [place] [facility] Radiology Electronically Signed   By: Jacqulynn Cadet M.D.   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 She presents today for trans arterial radioembolization. EXAM: IR EMBO TUMOR ORGAN ISCHEMIA INFARCT INC GUIDE ROADMAPPING; SELECTIVE VISCERAL ARTERIOGRAPHY; ADDITIONAL ARTERIOGRAPHY; IR ULTRASOUND GUIDANCE VASC ACCESS LEFT 1. Ultrasound-guided vascular access left radial artery  2. Catheterization of the superior mesenteric artery with arteriogram 3. Catheterization of the common hepatic artery with arteriogram 4. Catheterization of the common trunk of the segment 3/4 hepatic artery 5. Catheterization of the segment 3 hepatic artery 6. Trans arterial radiation segmentectomy of hepatic segment 3 7. Catheterization of the segment 2 hepatic artery 8. Trans arterial radiation segmentectomy of hepatic segment 2 MEDICATIONS: 3.375 g Zosyn. The antibiotic was  administered within 1 hour of the procedure ANESTHESIA/SEDATION: Moderate (conscious) sedation was employed during this procedure. A total of Versed 2 mg and Fentanyl 100 mcg was administered intravenously. Moderate Sedation Time: 65 minutes. The patient's level of consciousness and vital signs were monitored continuously by radiology nursing throughout the procedure under my direct supervision. CONTRAST:  46mL OMNIPAQUE IOHEXOL 300 MG/ML SOLN, 39mL OMNIPAQUE IOHEXOL 300 MG/ML SOLN FLUOROSCOPY TIME:  Fluoroscopy Time: 9 minutes 24 seconds (1186 mGy). COMPLICATIONS: None immediate. PROCEDURE: Informed consent was obtained from the patient following explanation of the procedure, risks, benefits and alternatives. The patient understands, agrees and consents for the procedure. All questions were addressed. A time out was performed prior to the initiation of the procedure. Maximal barrier sterile technique utilized including caps, mask, sterile gowns, sterile gloves, large sterile drape, hand hygiene, and Betadine prep. The left radial artery was interrogated with ultrasound and found to be widely patent and of adequate diameter. An image was obtained and stored for the medical record. Local anesthesia was attained by infiltration with 1% lidocaine. Under real-time sonographic guidance, the vessel was punctured with a 21 gauge micropuncture needle. Using standard technique, the initial micro needle was exchanged over a 0.021 micro wire for a  hydrophilic 5 French slim arterial sheath. The sheath was flushed and then a radial cocktail consisting of 5000 units heparin, 2.5 mg Verapamil and 200 mcg nitroglycerine was injected through the sheath. An ultimate 1 catheter was advanced over a Bentson wire into the abdominal aorta. The superior mesenteric artery was successfully catheterized with the catheter. Arteriography was performed identifying the origin of the replaced common hepatic artery. Utilizing a Glidewire, the common hepatic artery was successfully catheterized. Arteriography was then performed to identify the origins of the segment 2 and segment 3/4 hepatic arteries. A renegade hi Flo microcatheter was introduced over a Fathom 16 wire and advanced into the common trunk of the segment 3/4 hepatic artery. Additional arteriography was then performed in different obliquities in order to separate the branching pattern between hepatic segments 3 and 4. The microcatheter was successfully advanced beyond the origin of the segment 4 artery and into the segment 3 artery. Arteriography was performed confirming that the catheter is indeed within the segment 3 hepatic artery and identifying tumor blush within hepatic segment 3. The microcatheter was then fixed in place. Trans arterial radio embolization was then performed utilizing resin Y 90 microspheres. This was performed in small aliquots according to our standard protocol. Antegrade flow was confirmed within the vessel throughout the radioembolization. There was no stasis. The microcatheter was then removed and appropriately disposed of. A new Renegade high flow microcatheter was reintroduced through the 5 [place] base catheter and used to select the segment 2 artery. The microcatheter was advanced deeper into the segment 2 artery and arteriography was performed. Tumor blush was identified in hepatic segment 2. The microcatheter was fixed in place. Trans arterial radio embolization was then performed using  our standard technique. Antegrade flow was confirmed in the segment 2 artery throughout the procedure. Stasis was not attained. The microcatheter was brought back into the 5 French base catheter and the catheter system was disposed of as a unit. The left radial vascular sheath was removed. Hemostasis was attained with the assistance of a TR band. IMPRESSION: 1. Successful trans arterial radiation segmentectomy of hepatic segment 3. 2. Successful transarterial radiation segmentectomy of hepatic segment 2. Signed, Criselda Peaches, MD, [place] [facility] Radiology Electronically Signed  By: Jacqulynn Cadet M.D.   On: 10/02/2019 12:37   IR Angiogram Selective Each Additional Vessel  Result Date: 09/21/2019 INDICATION: 64 year old female with multifocal hepatocellular carcinoma isolated to the lateral segment of the left hepatic lobe. She presents for pre Y 90 arterial mapping. EXAM: 1. Ultrasound-guided access left radial artery 2. Catheterization of the celiac axis with arteriogram 3. Catheterization of the superior mesenteric artery with arteriogram 4. Catheterization of the replaced common hepatic artery with arteriogram 5. Catheterization of the segment 3/4 left hepatic artery with arteriogram 6. Catheterization of the segment 3 hepatic artery with arteriogram 7. Catheterization of the segment 2 left hepatic artery with arteriogram MEDICATIONS: None ANESTHESIA/SEDATION: Moderate (conscious) sedation was employed during this procedure. A total of Versed 2 mg and Fentanyl 100 mcg was administered intravenously. Moderate Sedation Time: 70 minutes. The patient's level of consciousness and vital signs were monitored continuously by radiology nursing throughout the procedure under my direct supervision. CONTRAST:  100 mL Isovue 300 FLUOROSCOPY TIME:  Fluoroscopy Time: 17 minutes 30 seconds (1,800 mGy). COMPLICATIONS: None immediate. PROCEDURE: Informed consent was  obtained from the patient following explanation of the procedure, risks, benefits and alternatives. The patient understands, agrees and consents for the procedure. All questions were addressed. A time out was performed prior to the initiation of the procedure. Maximal barrier sterile technique utilized including caps, mask, sterile gowns, sterile gloves, large sterile drape, hand hygiene, and Betadine prep. The left radial artery was interrogated with ultrasound and found to be widely patent and of adequate diameter. An image was obtained and stored for the medical record. Local anesthesia was attained by infiltration with 1% lidocaine. Under real-time sonographic guidance, the vessel was punctured with a 21 gauge micropuncture needle. Using standard technique, the initial micro needle was exchanged over a 0.021 micro wire for a hydrophilic 5 French slim arterial sheath. The sheath was flushed and then a radial cocktail consisting of 5000 units heparin, 2.5 mg Verapamil and 200 mcg nitroglycerine was injected through the sheath. An ultimate 2 catheter was advanced over a Bentson wire into the abdominal aorta. The catheter was used to select the celiac artery. An arteriogram was performed. There is variant hepatic arterial anatomy. The celiac artery gives rise to the splenic artery, left gastric artery and dorsal pancreatic artery. No evidence of hepatic artery. The catheter was next used to select the origin of the superior mesenteric artery. Arteriography was performed. The common hepatic arteries completely replaced to the SMA. A glidewire was successfully advanced into the common hepatic artery and the ultimate 1 catheter advanced over the wire into the artery. Common hepatic arteriography was then performed in multiple obliquities. Prominent supra duodenal artery arising just distal to the gastroduodenal artery. The right gastric artery is visualized coming off the proper hepatic artery. A renegade STC  microcatheter was advanced over a Fathom 16 wire and used to select the segment 4/3 left hepatic artery. Arteriography was performed. Two hypervascular tumors are present within the segment 3 distribution. The microcatheter was successfully navigated beyond the bifurcation into the segment 4 and 3 hepatic arteries and into the segment 3 hepatic artery. Additional arteriography was performed. Excellent tumor blush identified. No reflux. No stasis. No evidence of extrahepatic arterial supply. The microcatheter was then brought back into the proper hepatic artery. After several attempts, the segment 2 hepatic artery was successfully catheterized. Arteriography was performed demonstrating hypervascular tumor blush in 2 lesions. No evidence of reflux, stasis or extrahepatic arterial supply. The microcatheter was brought  back into the proper hepatic artery. Extensive effort was then made to catheterize the right gastric artery, however this was ultimately unsuccessful. It was felt acceptable to leave the right gastric artery open as treatment would be fairly distal in the segment 2 and 3 hepatic arteries and the chance of reflux into the right gastric artery is near 0. Therefore, technetium labeled macro aggregated albumin was successfully injected into the left hepatic artery. The catheters were removed. Hemostasis was attained with the assistance of a TR band IMPRESSION: 1. 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 2. 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 3. Successful multivessel angiogram. There are 4 hypervascular lesions in the lateral segment of the left hemi liver, two in segment 2 and two in segment 3. PLAN: Return in 2 weeks for split dose segmentectomy targeting segments 2 and 3. Signed, Criselda Peaches, MD, [place] [facility] Radiology Electronically  Signed   By: Jacqulynn Cadet M.D.   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 She presents for pre Y 90 arterial mapping. EXAM: 1. Ultrasound-guided access left radial artery 2. Catheterization of the celiac axis with arteriogram 3. Catheterization of the superior mesenteric artery with arteriogram 4. Catheterization of the replaced common hepatic artery with arteriogram 5. Catheterization of the segment 3/4 left hepatic artery with arteriogram 6. Catheterization of the segment 3 hepatic artery with arteriogram 7. Catheterization of the segment 2 left hepatic artery with arteriogram MEDICATIONS: None ANESTHESIA/SEDATION: Moderate (conscious) sedation was employed during this procedure. A total of Versed 2 mg and Fentanyl 100 mcg was administered intravenously. Moderate Sedation Time: 70 minutes. The patient's level of consciousness and vital signs were monitored continuously by radiology nursing throughout the procedure under my direct supervision. CONTRAST:  100 mL Isovue 300 FLUOROSCOPY TIME:  Fluoroscopy Time: 17 minutes 30 seconds (1,800 mGy). COMPLICATIONS: None immediate. PROCEDURE: Informed consent was obtained from the patient following explanation of the procedure, risks, benefits and alternatives. The patient understands, agrees and consents for the procedure. All questions were addressed. A time out was performed prior to the initiation of the procedure. Maximal barrier sterile technique utilized including caps, mask, sterile gowns, sterile gloves, large sterile drape, hand hygiene, and Betadine prep. The left radial artery was interrogated with ultrasound and found to be widely patent and of adequate diameter. An image was obtained and stored for the medical record. Local anesthesia was attained by infiltration with 1%  lidocaine. Under real-time sonographic guidance, the vessel was punctured with a 21 gauge micropuncture needle. Using standard technique, the initial micro needle was exchanged over a 0.021 micro wire for a hydrophilic 5 French slim arterial sheath. The sheath was flushed and then a radial cocktail consisting of 5000 units heparin, 2.5 mg Verapamil and 200 mcg nitroglycerine was injected through the sheath. An ultimate 2 catheter was advanced over a Bentson wire into the abdominal aorta. The catheter was used to select the celiac artery. An arteriogram was performed. There is variant hepatic arterial anatomy. The celiac artery gives rise to the splenic artery, left gastric artery and dorsal pancreatic artery. No evidence of hepatic artery. The catheter was next used to select the origin of the superior mesenteric artery. Arteriography was performed. The common hepatic arteries  completely replaced to the SMA. A glidewire was successfully advanced into the common hepatic artery and the ultimate 1 catheter advanced over the wire into the artery. Common hepatic arteriography was then performed in multiple obliquities. Prominent supra duodenal artery arising just distal to the gastroduodenal artery. The right gastric artery is visualized coming off the proper hepatic artery. A renegade STC microcatheter was advanced over a Fathom 16 wire and used to select the segment 4/3 left hepatic artery. Arteriography was performed. Two hypervascular tumors are present within the segment 3 distribution. The microcatheter was successfully navigated beyond the bifurcation into the segment 4 and 3 hepatic arteries and into the segment 3 hepatic artery. Additional arteriography was performed. Excellent tumor blush identified. No reflux. No stasis. No evidence of extrahepatic arterial supply. The microcatheter was then brought back into the proper hepatic artery. After several attempts, the segment 2 hepatic artery was successfully  catheterized. Arteriography was performed demonstrating hypervascular tumor blush in 2 lesions. No evidence of reflux, stasis or extrahepatic arterial supply. The microcatheter was brought back into the proper hepatic artery. Extensive effort was then made to catheterize the right gastric artery, however this was ultimately unsuccessful. It was felt acceptable to leave the right gastric artery open as treatment would be fairly distal in the segment 2 and 3 hepatic arteries and the chance of reflux into the right gastric artery is near 0. Therefore, technetium labeled macro aggregated albumin was successfully injected into the left hepatic artery. The catheters were removed. Hemostasis was attained with the assistance of a TR band IMPRESSION: 1. 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 2. 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 3. Successful multivessel angiogram. There are 4 hypervascular lesions in the lateral segment of the left hemi liver, two in segment 2 and two in segment 3. PLAN: Return in 2 weeks for split dose segmentectomy targeting segments 2 and 3. Signed, Criselda Peaches, MD, [place] [facility] Radiology Electronically Signed   By: Jacqulynn Cadet M.D.   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 She presents for pre Y 90 arterial mapping. EXAM: 1. Ultrasound-guided access left radial artery 2. Catheterization of the celiac axis with arteriogram 3. Catheterization of the superior mesenteric artery with arteriogram 4. Catheterization of the replaced common hepatic artery with arteriogram 5. Catheterization of the segment 3/4 left hepatic artery with arteriogram 6.  Catheterization of the segment 3 hepatic artery with arteriogram 7. Catheterization of the segment 2 left hepatic artery with arteriogram MEDICATIONS: None ANESTHESIA/SEDATION: Moderate (conscious) sedation was employed during this procedure. A total of Versed 2 mg and Fentanyl 100 mcg was administered intravenously. Moderate Sedation Time: 70 minutes. The patient's level of consciousness and vital signs were monitored continuously by radiology nursing throughout the procedure under my direct supervision. CONTRAST:  100 mL Isovue 300 FLUOROSCOPY TIME:  Fluoroscopy Time: 17 minutes 30 seconds (1,800 mGy). COMPLICATIONS: None immediate. PROCEDURE: Informed consent was obtained from the patient following explanation of the procedure, risks, benefits and alternatives. The patient understands, agrees and consents for the procedure. All questions were addressed. A time out was performed prior to the initiation of the procedure. Maximal barrier sterile technique utilized including caps, mask, sterile gowns, sterile gloves, large sterile drape,  hand hygiene, and Betadine prep. The left radial artery was interrogated with ultrasound and found to be widely patent and of adequate diameter. An image was obtained and stored for the medical record. Local anesthesia was attained by infiltration with 1% lidocaine. Under real-time sonographic guidance, the vessel was punctured with a 21 gauge micropuncture needle. Using standard technique, the initial micro needle was exchanged over a 0.021 micro wire for a hydrophilic 5 French slim arterial sheath. The sheath was flushed and then a radial cocktail consisting of 5000 units heparin, 2.5 mg Verapamil and 200 mcg nitroglycerine was injected through the sheath. An ultimate 2 catheter was advanced over a Bentson wire into the abdominal aorta. The catheter was used to select the celiac artery. An arteriogram was performed. There is variant hepatic arterial anatomy. The celiac artery gives  rise to the splenic artery, left gastric artery and dorsal pancreatic artery. No evidence of hepatic artery. The catheter was next used to select the origin of the superior mesenteric artery. Arteriography was performed. The common hepatic arteries completely replaced to the SMA. A glidewire was successfully advanced into the common hepatic artery and the ultimate 1 catheter advanced over the wire into the artery. Common hepatic arteriography was then performed in multiple obliquities. Prominent supra duodenal artery arising just distal to the gastroduodenal artery. The right gastric artery is visualized coming off the proper hepatic artery. A renegade STC microcatheter was advanced over a Fathom 16 wire and used to select the segment 4/3 left hepatic artery. Arteriography was performed. Two hypervascular tumors are present within the segment 3 distribution. The microcatheter was successfully navigated beyond the bifurcation into the segment 4 and 3 hepatic arteries and into the segment 3 hepatic artery. Additional arteriography was performed. Excellent tumor blush identified. No reflux. No stasis. No evidence of extrahepatic arterial supply. The microcatheter was then brought back into the proper hepatic artery. After several attempts, the segment 2 hepatic artery was successfully catheterized. Arteriography was performed demonstrating hypervascular tumor blush in 2 lesions. No evidence of reflux, stasis or extrahepatic arterial supply. The microcatheter was brought back into the proper hepatic artery. Extensive effort was then made to catheterize the right gastric artery, however this was ultimately unsuccessful. It was felt acceptable to leave the right gastric artery open as treatment would be fairly distal in the segment 2 and 3 hepatic arteries and the chance of reflux into the right gastric artery is near 0. Therefore, technetium labeled macro aggregated albumin was successfully injected into the left hepatic  artery. The catheters were removed. Hemostasis was attained with the assistance of a TR band IMPRESSION: 1. 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 2. 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 3. Successful multivessel angiogram. There are 4 hypervascular lesions in the lateral segment of the left hemi liver, two in segment 2 and two in segment 3. PLAN: Return in 2 weeks for split dose segmentectomy targeting segments 2 and 3. Signed, Criselda Peaches, MD, [place] [facility] Radiology Electronically Signed   By: Jacqulynn Cadet M.D.   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 She presents for pre Y 90 arterial mapping. EXAM: 1. Ultrasound-guided access left radial artery 2. Catheterization of the celiac axis with  arteriogram 3. Catheterization of the superior mesenteric artery with arteriogram 4. Catheterization of the replaced common hepatic artery with arteriogram 5. Catheterization of the segment 3/4 left hepatic artery with arteriogram 6. Catheterization of the segment 3 hepatic artery with arteriogram 7. Catheterization of the segment 2 left hepatic artery with arteriogram MEDICATIONS: None ANESTHESIA/SEDATION: Moderate (conscious) sedation was employed during this procedure. A total of Versed 2 mg and Fentanyl 100 mcg was administered intravenously. Moderate Sedation Time: 70 minutes. The patient's level of consciousness and vital signs were monitored continuously by radiology nursing throughout the procedure under my direct supervision. CONTRAST:  100 mL Isovue 300 FLUOROSCOPY TIME:  Fluoroscopy Time: 17 minutes 30 seconds (1,800 mGy). COMPLICATIONS: None immediate.  PROCEDURE: Informed consent was obtained from the patient following explanation of the procedure, risks, benefits and alternatives. The patient understands, agrees and consents for the procedure. All questions were addressed. A time out was performed prior to the initiation of the procedure. Maximal barrier sterile technique utilized including caps, mask, sterile gowns, sterile gloves, large sterile drape, hand hygiene, and Betadine prep. The left radial artery was interrogated with ultrasound and found to be widely patent and of adequate diameter. An image was obtained and stored for the medical record. Local anesthesia was attained by infiltration with 1% lidocaine. Under real-time sonographic guidance, the vessel was punctured with a 21 gauge micropuncture needle. Using standard technique, the initial micro needle was exchanged over a 0.021 micro wire for a hydrophilic 5 French slim arterial sheath. The sheath was flushed and then a radial cocktail consisting of 5000 units heparin, 2.5 mg Verapamil and 200 mcg nitroglycerine was injected through the sheath. An ultimate 2 catheter was advanced over a Bentson wire into the abdominal aorta. The catheter was used to select the celiac artery. An arteriogram was performed. There is variant hepatic arterial anatomy. The celiac artery gives rise to the splenic artery, left gastric artery and dorsal pancreatic artery. No evidence of hepatic artery. The catheter was next used to select the origin of the superior mesenteric artery. Arteriography was performed. The common hepatic arteries completely replaced to the SMA. A glidewire was successfully advanced into the common hepatic artery and the ultimate 1 catheter advanced over the wire into the artery. Common hepatic arteriography was then performed in multiple obliquities. Prominent supra duodenal artery arising just distal to the gastroduodenal artery. The right gastric artery is visualized coming off the proper hepatic  artery. A renegade STC microcatheter was advanced over a Fathom 16 wire and used to select the segment 4/3 left hepatic artery. Arteriography was performed. Two hypervascular tumors are present within the segment 3 distribution. The microcatheter was successfully navigated beyond the bifurcation into the segment 4 and 3 hepatic arteries and into the segment 3 hepatic artery. Additional arteriography was performed. Excellent tumor blush identified. No reflux. No stasis. No evidence of extrahepatic arterial supply. The microcatheter was then brought back into the proper hepatic artery. After several attempts, the segment 2 hepatic artery was successfully catheterized. Arteriography was performed demonstrating hypervascular tumor blush in 2 lesions. No evidence of reflux, stasis or extrahepatic arterial supply. The microcatheter was brought back into the proper hepatic artery. Extensive effort was then made to catheterize the right gastric artery, however this was ultimately unsuccessful. It was felt acceptable to leave the right gastric artery open as treatment would be fairly distal in the segment 2 and 3 hepatic arteries and the chance of reflux into the right gastric artery is near 0. Therefore,  technetium labeled macro aggregated albumin was successfully injected into the left hepatic artery. The catheters were removed. Hemostasis was attained with the assistance of a TR band IMPRESSION: 1. 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 2. 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 3. Successful multivessel angiogram. There are 4 hypervascular lesions in the lateral segment of the left hemi liver, two in segment 2 and two in segment 3. PLAN: Return in 2 weeks for split dose segmentectomy targeting segments 2 and 3. Signed, Criselda Peaches, MD, [place] [facility]  Radiology Electronically Signed   By: Jacqulynn Cadet M.D.   On: 09/21/2019 16:25   NM Special Med Rad Physics Cons  Result Date: 10/02/2019 CLINICAL DATA:  Hepatocellular carcinoma with several small lesions isolated to the LEFT hepatic lobe. Radiation segmentectomy to segments 2 and 3 of the LEFT hepatic lobe. EXAM: NUCLEAR MEDICINE SPECIAL MED RAD PHYSICS CONS; NUCLEAR MEDICINE RADIO PHARM THERAPY INTRA ARTERIAL; NUCLEAR MEDICINE TREATMENT PROCEDURE; NUCLEAR MEDICINE LIVER SCAN TECHNIQUE: In conjunction with the interventional radiologist a Y- Microsphere dose was calculated utilizing body surface area formulation and partition method for radiation segmentectomy. Calculated dose equal 22.7 mCi. Pre therapy MAA liver SPECT scan and CTA were evaluated. Utilizing a microcatheter system, the LEFT hepatic artery was selected and Y-90 microspheres were delivered in fractionated aliquots. The dose was split between the segment 2 and segment 3 hepatic arteries. Radiopharmaceutical was delivered by the interventional radiologist and nuclear radiologist. The patient tolerated procedure well. No adverse effects were noted. Bremsstrahlung planar and SPECT imaging of the abdomen following intrahepatic arterial delivery of Y-90 microsphere was performed. RADIOPHARMACEUTICALS:  85.63 millicuries yttrium 90 microspheres. COMPARISON:  MAA scan 09/21/2019, MRI abdomen 07/23/2019 FINDINGS: Y - 90 microspheres therapy as above. First therapy the LEFT hepatic lobe. Radiation segmentectomy to segments 2 and 3 as above. Bremsstrahlung planar and SPECT imaging of the abdomen following intrahepatic arterial delivery of Y-93microsphere demonstrates radioactivity localized to the LEFT hepatic lobe. No evidence of extrahepatic activity. IMPRESSION: Successful Y - 90 microsphere delivery for treatment of unresectable liver metastasis. First therapy to the LEFT lobe. Intention, radiation segmentectomy segments 2 and 3. Bremssstrahlung  scan demonstrates activity localized to LEFT hepatic lobe with no extrahepatic activity identified. Electronically Signed   By: Suzy Bouchard M.D.   On: 10/02/2019 13:13   NM Special Treatment Procedure  Result Date: 10/02/2019 CLINICAL DATA:  Hepatocellular carcinoma with several small lesions isolated to the LEFT hepatic lobe. Radiation segmentectomy to segments 2 and 3 of the LEFT hepatic lobe. EXAM: NUCLEAR MEDICINE SPECIAL MED RAD PHYSICS CONS; NUCLEAR MEDICINE RADIO PHARM THERAPY INTRA ARTERIAL; NUCLEAR MEDICINE TREATMENT PROCEDURE; NUCLEAR MEDICINE LIVER SCAN TECHNIQUE: In conjunction with the interventional radiologist a Y- Microsphere dose was calculated utilizing body surface area formulation and partition method for radiation segmentectomy. Calculated dose equal 22.7 mCi. Pre therapy MAA liver SPECT scan and CTA were evaluated. Utilizing a microcatheter system, the LEFT hepatic artery was selected and Y-90 microspheres were delivered in fractionated aliquots. The dose was split between the segment 2 and segment 3 hepatic arteries. Radiopharmaceutical was delivered by the interventional radiologist and nuclear radiologist. The patient tolerated procedure well. No adverse effects were noted. Bremsstrahlung planar and SPECT imaging of the abdomen following intrahepatic arterial delivery of Y-90 microsphere was performed. RADIOPHARMACEUTICALS:  14.97 millicuries yttrium 90 microspheres. COMPARISON:  MAA scan 09/21/2019, MRI abdomen 07/23/2019  FINDINGS: Y - 90 microspheres therapy as above. First therapy the LEFT hepatic lobe. Radiation segmentectomy to segments 2 and 3 as above. Bremsstrahlung planar and SPECT imaging of the abdomen following intrahepatic arterial delivery of Y-53microsphere demonstrates radioactivity localized to the LEFT hepatic lobe. No evidence of extrahepatic activity. IMPRESSION: Successful Y - 90 microsphere delivery for treatment of unresectable liver metastasis. First therapy  to the LEFT lobe. Intention, radiation segmentectomy segments 2 and 3. Bremssstrahlung scan demonstrates activity localized to LEFT hepatic lobe with no extrahepatic activity identified. Electronically Signed   By: Suzy Bouchard M.D.   On: 10/02/2019 13:13   IR US Guide Vasc Access Left  Result Date: 10/02/2019 INDICATION: 64 year old female with alcoholic cirrhosis complicated by multifocal hepatocellular carcinoma isolated to the lateral segments of the left hepatic lobe. She presents today for trans arterial radioembolization. EXAM: IR EMBO TUMOR ORGAN ISCHEMIA INFARCT INC GUIDE ROADMAPPING; SELECTIVE VISCERAL ARTERIOGRAPHY; ADDITIONAL ARTERIOGRAPHY; IR ULTRASOUND GUIDANCE VASC ACCESS LEFT 1. Ultrasound-guided vascular access left radial artery 2. Catheterization of the superior mesenteric artery with arteriogram 3. Catheterization of the common hepatic artery with arteriogram 4. Catheterization of the common trunk of the segment 3/4 hepatic artery 5. Catheterization of the segment 3 hepatic artery 6. Trans arterial radiation segmentectomy of hepatic segment 3 7. Catheterization of the segment 2 hepatic artery 8. Trans arterial radiation segmentectomy of hepatic segment 2 MEDICATIONS: 3.375 g Zosyn. The antibiotic was administered within 1 hour of the procedure ANESTHESIA/SEDATION: Moderate (conscious) sedation was employed during this procedure. A total of Versed 2 mg and Fentanyl 100 mcg was administered intravenously. Moderate Sedation Time: 65 minutes. The patient's level of consciousness and vital signs were monitored continuously by radiology nursing throughout the procedure under my direct supervision. CONTRAST:  89mL OMNIPAQUE IOHEXOL 300 MG/ML SOLN, 57mL OMNIPAQUE IOHEXOL 300 MG/ML SOLN FLUOROSCOPY TIME:  Fluoroscopy Time: 9 minutes 24 seconds (1186 mGy). COMPLICATIONS: None immediate. PROCEDURE: Informed consent was obtained from the patient following explanation of the procedure, risks, benefits  and alternatives. The patient understands, agrees and consents for the procedure. All questions were addressed. A time out was performed prior to the initiation of the procedure. Maximal barrier sterile technique utilized including caps, mask, sterile gowns, sterile gloves, large sterile drape, hand hygiene, and Betadine prep. The left radial artery was interrogated with ultrasound and found to be widely patent and of adequate diameter. An image was obtained and stored for the medical record. Local anesthesia was attained by infiltration with 1% lidocaine. Under real-time sonographic guidance, the vessel was punctured with a 21 gauge micropuncture needle. Using standard technique, the initial micro needle was exchanged over a 0.021 micro wire for a hydrophilic 5 French slim arterial sheath. The sheath was flushed and then a radial cocktail consisting of 5000 units heparin, 2.5 mg Verapamil and 200 mcg nitroglycerine was injected through the sheath. An ultimate 1 catheter was advanced over a Bentson wire into the abdominal aorta. The superior mesenteric artery was successfully catheterized with the catheter. Arteriography was performed identifying the origin of the replaced common hepatic artery. Utilizing a Glidewire, the common hepatic artery was successfully catheterized. Arteriography was then performed to identify the origins of the segment 2 and segment 3/4 hepatic arteries. A renegade hi Flo microcatheter was introduced over a Fathom 16 wire and advanced into the common trunk of the segment 3/4 hepatic artery. Additional arteriography was then performed in different obliquities in order to separate the branching pattern between hepatic segments 3 and 4. The microcatheter was  successfully advanced beyond the origin of the segment 4 artery and into the segment 3 artery. Arteriography was performed confirming that the catheter is indeed within the segment 3 hepatic artery and identifying tumor blush within  hepatic segment 3. The microcatheter was then fixed in place. Trans arterial radio embolization was then performed utilizing resin Y 90 microspheres. This was performed in small aliquots according to our standard protocol. Antegrade flow was confirmed within the vessel throughout the radioembolization. There was no stasis. The microcatheter was then removed and appropriately disposed of. A new Renegade high flow microcatheter was reintroduced through the 5 [place] base catheter and used to select the segment 2 artery. The microcatheter was advanced deeper into the segment 2 artery and arteriography was performed. Tumor blush was identified in hepatic segment 2. The microcatheter was fixed in place. Trans arterial radio embolization was then performed using our standard technique. Antegrade flow was confirmed in the segment 2 artery throughout the procedure. Stasis was not attained. The microcatheter was brought back into the 5 French base catheter and the catheter system was disposed of as a unit. The left radial vascular sheath was removed. Hemostasis was attained with the assistance of a TR band. IMPRESSION: 1. Successful trans arterial radiation segmentectomy of hepatic segment 3. 2. Successful transarterial radiation segmentectomy of hepatic segment 2. Signed, Criselda Peaches, MD, [place] [facility] Radiology Electronically Signed   By: Jacqulynn Cadet M.D.   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 She presents for pre Y 90 arterial mapping. EXAM: 1. Ultrasound-guided access left radial artery 2. Catheterization of the celiac axis with arteriogram 3. Catheterization of the superior mesenteric artery with arteriogram 4. Catheterization of the replaced common hepatic artery with arteriogram 5.  Catheterization of the segment 3/4 left hepatic artery with arteriogram 6. Catheterization of the segment 3 hepatic artery with arteriogram 7. Catheterization of the segment 2 left hepatic artery with arteriogram MEDICATIONS: None ANESTHESIA/SEDATION: Moderate (conscious) sedation was employed during this procedure. A total of Versed 2 mg and Fentanyl 100 mcg was administered intravenously. Moderate Sedation Time: 70 minutes. The patient's level of consciousness and vital signs were monitored continuously by radiology nursing throughout the procedure under my direct supervision. CONTRAST:  100 mL Isovue 300 FLUOROSCOPY TIME:  Fluoroscopy Time: 17 minutes 30 seconds (1,800 mGy). COMPLICATIONS: None immediate. PROCEDURE: Informed consent was obtained from the patient following explanation of the procedure, risks, benefits and alternatives. The patient understands, agrees and consents for the procedure. All questions were addressed. A time out was performed prior to the initiation of the procedure. Maximal barrier sterile technique utilized including caps, mask, sterile gowns, sterile gloves, large sterile drape, hand hygiene, and Betadine prep. The left radial artery was interrogated with ultrasound and found to be widely patent and of adequate diameter. An image was obtained and stored for the medical record. Local anesthesia was attained by infiltration with 1% lidocaine. Under real-time sonographic guidance, the vessel was punctured with a 21 gauge micropuncture needle. Using standard technique, the initial micro needle was exchanged over a 0.021 micro wire for a hydrophilic 5 French slim arterial sheath. The sheath was flushed and then a radial cocktail consisting of 5000 units heparin, 2.5 mg Verapamil and 200 mcg nitroglycerine was injected through the sheath. An ultimate 2 catheter was advanced over a Bentson wire  into the abdominal aorta. The catheter was used to select the celiac artery. An arteriogram was  performed. There is variant hepatic arterial anatomy. The celiac artery gives rise to the splenic artery, left gastric artery and dorsal pancreatic artery. No evidence of hepatic artery. The catheter was next used to select the origin of the superior mesenteric artery. Arteriography was performed. The common hepatic arteries completely replaced to the SMA. A glidewire was successfully advanced into the common hepatic artery and the ultimate 1 catheter advanced over the wire into the artery. Common hepatic arteriography was then performed in multiple obliquities. Prominent supra duodenal artery arising just distal to the gastroduodenal artery. The right gastric artery is visualized coming off the proper hepatic artery. A renegade STC microcatheter was advanced over a Fathom 16 wire and used to select the segment 4/3 left hepatic artery. Arteriography was performed. Two hypervascular tumors are present within the segment 3 distribution. The microcatheter was successfully navigated beyond the bifurcation into the segment 4 and 3 hepatic arteries and into the segment 3 hepatic artery. Additional arteriography was performed. Excellent tumor blush identified. No reflux. No stasis. No evidence of extrahepatic arterial supply. The microcatheter was then brought back into the proper hepatic artery. After several attempts, the segment 2 hepatic artery was successfully catheterized. Arteriography was performed demonstrating hypervascular tumor blush in 2 lesions. No evidence of reflux, stasis or extrahepatic arterial supply. The microcatheter was brought back into the proper hepatic artery. Extensive effort was then made to catheterize the right gastric artery, however this was ultimately unsuccessful. It was felt acceptable to leave the right gastric artery open as treatment would be fairly distal in the segment 2 and 3 hepatic arteries and the chance of reflux into the right gastric artery is near 0. Therefore, technetium  labeled macro aggregated albumin was successfully injected into the left hepatic artery. The catheters were removed. Hemostasis was attained with the assistance of a TR band IMPRESSION: 1. 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 2. 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 3. Successful multivessel angiogram. There are 4 hypervascular lesions in the lateral segment of the left hemi liver, two in segment 2 and two in segment 3. PLAN: Return in 2 weeks for split dose segmentectomy targeting segments 2 and 3. Signed, Criselda Peaches, MD, [place] [facility] Radiology Electronically Signed   By: Jacqulynn Cadet M.D.   On: 09/21/2019 16:25   IR EMBO TUMOR ORGAN ISCHEMIA INFARCT INC GUIDE ROADMAPPING  Result Date: 10/02/2019 INDICATION: 64 year old female with alcoholic cirrhosis complicated by multifocal hepatocellular carcinoma isolated to the lateral segments of the left hepatic lobe. She presents today for trans arterial radioembolization. EXAM: IR EMBO TUMOR ORGAN ISCHEMIA INFARCT INC GUIDE ROADMAPPING; SELECTIVE VISCERAL ARTERIOGRAPHY; ADDITIONAL ARTERIOGRAPHY; IR ULTRASOUND GUIDANCE VASC ACCESS LEFT 1. Ultrasound-guided vascular access left radial artery 2. Catheterization of the superior mesenteric artery with arteriogram 3. Catheterization of the common hepatic artery with arteriogram 4. Catheterization of the common trunk of the segment 3/4 hepatic artery 5. Catheterization of the segment 3 hepatic artery 6. Trans arterial radiation segmentectomy of hepatic segment 3 7. Catheterization of the segment 2 hepatic artery 8. Trans arterial radiation segmentectomy of hepatic segment 2 MEDICATIONS: 3.375 g Zosyn. The antibiotic was administered within 1 hour of the procedure ANESTHESIA/SEDATION: Moderate (conscious) sedation was employed during this  procedure. A total of Versed 2 mg  and Fentanyl 100 mcg was administered intravenously. Moderate Sedation Time: 65 minutes. The patient's level of consciousness and vital signs were monitored continuously by radiology nursing throughout the procedure under my direct supervision. CONTRAST:  47mL OMNIPAQUE IOHEXOL 300 MG/ML SOLN, 42mL OMNIPAQUE IOHEXOL 300 MG/ML SOLN FLUOROSCOPY TIME:  Fluoroscopy Time: 9 minutes 24 seconds (1186 mGy). COMPLICATIONS: None immediate. PROCEDURE: Informed consent was obtained from the patient following explanation of the procedure, risks, benefits and alternatives. The patient understands, agrees and consents for the procedure. All questions were addressed. A time out was performed prior to the initiation of the procedure. Maximal barrier sterile technique utilized including caps, mask, sterile gowns, sterile gloves, large sterile drape, hand hygiene, and Betadine prep. The left radial artery was interrogated with ultrasound and found to be widely patent and of adequate diameter. An image was obtained and stored for the medical record. Local anesthesia was attained by infiltration with 1% lidocaine. Under real-time sonographic guidance, the vessel was punctured with a 21 gauge micropuncture needle. Using standard technique, the initial micro needle was exchanged over a 0.021 micro wire for a hydrophilic 5 French slim arterial sheath. The sheath was flushed and then a radial cocktail consisting of 5000 units heparin, 2.5 mg Verapamil and 200 mcg nitroglycerine was injected through the sheath. An ultimate 1 catheter was advanced over a Bentson wire into the abdominal aorta. The superior mesenteric artery was successfully catheterized with the catheter. Arteriography was performed identifying the origin of the replaced common hepatic artery. Utilizing a Glidewire, the common hepatic artery was successfully catheterized. Arteriography was then performed to identify the origins of the segment  2 and segment 3/4 hepatic arteries. A renegade hi Flo microcatheter was introduced over a Fathom 16 wire and advanced into the common trunk of the segment 3/4 hepatic artery. Additional arteriography was then performed in different obliquities in order to separate the branching pattern between hepatic segments 3 and 4. The microcatheter was successfully advanced beyond the origin of the segment 4 artery and into the segment 3 artery. Arteriography was performed confirming that the catheter is indeed within the segment 3 hepatic artery and identifying tumor blush within hepatic segment 3. The microcatheter was then fixed in place. Trans arterial radio embolization was then performed utilizing resin Y 90 microspheres. This was performed in small aliquots according to our standard protocol. Antegrade flow was confirmed within the vessel throughout the radioembolization. There was no stasis. The microcatheter was then removed and appropriately disposed of. A new Renegade high flow microcatheter was reintroduced through the 5 [place] base catheter and used to select the segment 2 artery. The microcatheter was advanced deeper into the segment 2 artery and arteriography was performed. Tumor blush was identified in hepatic segment 2. The microcatheter was fixed in place. Trans arterial radio embolization was then performed using our standard technique. Antegrade flow was confirmed in the segment 2 artery throughout the procedure. Stasis was not attained. The microcatheter was brought back into the 5 French base catheter and the catheter system was disposed of as a unit. The left radial vascular sheath was removed. Hemostasis was attained with the assistance of a TR band. IMPRESSION: 1. Successful trans arterial radiation segmentectomy of hepatic segment 3. 2. Successful transarterial radiation segmentectomy of hepatic segment 2. Signed, Criselda Peaches, MD, [facility]  [facility] Radiology Electronically Signed   By: Jacqulynn Cadet M.D.   On: 10/02/2019 12:37   IR EMBO TUMOR ORGAN ISCHEMIA INFARCT INC GUIDE ROADMAPPING  Result Date: 10/02/2019 INDICATION: 64 year old female with alcoholic cirrhosis complicated by multifocal hepatocellular carcinoma isolated to the lateral segments of the left hepatic lobe. She presents today for trans arterial radioembolization. EXAM: IR EMBO TUMOR ORGAN ISCHEMIA INFARCT INC GUIDE ROADMAPPING; SELECTIVE VISCERAL ARTERIOGRAPHY; ADDITIONAL ARTERIOGRAPHY; IR ULTRASOUND GUIDANCE VASC ACCESS LEFT 1. Ultrasound-guided vascular access left radial artery 2. Catheterization of the superior mesenteric artery with arteriogram 3. Catheterization of the common hepatic artery with arteriogram 4. Catheterization of the common trunk of the segment 3/4 hepatic artery 5. Catheterization of the segment 3 hepatic artery 6. Trans arterial radiation segmentectomy of hepatic segment 3 7. Catheterization of the segment 2 hepatic artery 8. Trans arterial radiation segmentectomy of hepatic segment 2 MEDICATIONS: 3.375 g Zosyn. The antibiotic was administered within 1 hour of the procedure ANESTHESIA/SEDATION: Moderate (conscious) sedation was employed during this procedure. A total of Versed 2 mg and Fentanyl 100 mcg was administered intravenously. Moderate Sedation Time: 65 minutes. The patient's level of consciousness and vital signs were monitored continuously by radiology nursing throughout the procedure under my direct supervision. CONTRAST:  81mL OMNIPAQUE IOHEXOL 300 MG/ML SOLN, 44mL OMNIPAQUE IOHEXOL 300 MG/ML SOLN FLUOROSCOPY TIME:  Fluoroscopy Time: 9 minutes 24 seconds (1186 mGy). COMPLICATIONS: None immediate. PROCEDURE: Informed consent was obtained from the patient following explanation of the procedure, risks, benefits and alternatives. The patient understands, agrees and consents for the procedure. All questions were addressed. A time out was  performed prior to the initiation of the procedure. Maximal barrier sterile technique utilized including caps, mask, sterile gowns, sterile gloves, large sterile drape, hand hygiene, and Betadine prep. The left radial artery was interrogated with ultrasound and found to be widely patent and of adequate diameter. An image was obtained and stored for the medical record. Local anesthesia was attained by infiltration with 1% lidocaine. Under real-time sonographic guidance, the vessel was punctured with a 21 gauge micropuncture needle. Using standard technique, the initial micro needle was exchanged over a 0.021 micro wire for a hydrophilic 5 French slim arterial sheath. The sheath was flushed and then a radial cocktail consisting of 5000 units heparin, 2.5 mg Verapamil and 200 mcg nitroglycerine was injected through the sheath. An ultimate 1 catheter was advanced over a Bentson wire into the abdominal aorta. The superior mesenteric artery was successfully catheterized with the catheter. Arteriography was performed identifying the origin of the replaced common hepatic artery. Utilizing a Glidewire, the common hepatic artery was successfully catheterized. Arteriography was then performed to identify the origins of the segment 2 and segment 3/4 hepatic arteries. A renegade hi Flo microcatheter was introduced over a Fathom 16 wire and advanced into the common trunk of the segment 3/4 hepatic artery. Additional arteriography was then performed in different obliquities in order to separate the branching pattern between hepatic segments 3 and 4. The microcatheter was successfully advanced beyond the origin of the segment 4 artery and into the segment 3 artery. Arteriography was performed confirming that the catheter is indeed within the segment 3 hepatic artery and identifying tumor blush within hepatic segment 3. The microcatheter was then fixed in place. Trans arterial radio embolization was then performed utilizing resin Y  90 microspheres. This was performed in small aliquots according to our standard protocol. Antegrade flow was confirmed within the vessel throughout the radioembolization. There was no stasis. The microcatheter was then removed and appropriately disposed of. A new Renegade high flow microcatheter was reintroduced through the 5 [place] base catheter and used to select the segment 2  artery. The microcatheter was advanced deeper into the segment 2 artery and arteriography was performed. Tumor blush was identified in hepatic segment 2. The microcatheter was fixed in place. Trans arterial radio embolization was then performed using our standard technique. Antegrade flow was confirmed in the segment 2 artery throughout the procedure. Stasis was not attained. The microcatheter was brought back into the 5 French base catheter and the catheter system was disposed of as a unit. The left radial vascular sheath was removed. Hemostasis was attained with the assistance of a TR band. IMPRESSION: 1. Successful trans arterial radiation segmentectomy of hepatic segment 3. 2. Successful transarterial radiation segmentectomy of hepatic segment 2. Signed, Criselda Peaches, MD, [facility] Radiology Electronically Signed   By: Jacqulynn Cadet M.D.   On: 10/02/2019 12:37   IR Radiologist Eval & Mgmt  Result Date: 08/28/2019 Please refer to notes tab for details about interventional procedure. (Op Note)  NM Radio Pharm Therapy Intraarterial  Result Date: 10/02/2019 CLINICAL DATA:  Hepatocellular carcinoma with several small lesions isolated to the LEFT hepatic lobe. Radiation segmentectomy to segments 2 and 3 of the LEFT hepatic lobe. EXAM: NUCLEAR MEDICINE SPECIAL MED RAD PHYSICS CONS; NUCLEAR MEDICINE RADIO PHARM THERAPY INTRA ARTERIAL; NUCLEAR MEDICINE TREATMENT PROCEDURE; NUCLEAR MEDICINE LIVER SCAN TECHNIQUE: In conjunction with the interventional radiologist a Y-  Microsphere dose was calculated utilizing body surface area formulation and partition method for radiation segmentectomy. Calculated dose equal 22.7 mCi. Pre therapy MAA liver SPECT scan and CTA were evaluated. Utilizing a microcatheter system, the LEFT hepatic artery was selected and Y-90 microspheres were delivered in fractionated aliquots. The dose was split between the segment 2 and segment 3 hepatic arteries. Radiopharmaceutical was delivered by the interventional radiologist and nuclear radiologist. The patient tolerated procedure well. No adverse effects were noted. Bremsstrahlung planar and SPECT imaging of the abdomen following intrahepatic arterial delivery of Y-90 microsphere was performed. RADIOPHARMACEUTICALS:  41.63 millicuries yttrium 90 microspheres. COMPARISON:  MAA scan 09/21/2019, MRI abdomen 07/23/2019 FINDINGS: Y - 90 microspheres therapy as above. First therapy the LEFT hepatic lobe. Radiation segmentectomy to segments 2 and 3 as above. Bremsstrahlung planar and SPECT imaging of the abdomen following intrahepatic arterial delivery of Y-80microsphere demonstrates radioactivity localized to the LEFT hepatic lobe. No evidence of extrahepatic activity. IMPRESSION: Successful Y - 90 microsphere delivery for treatment of unresectable liver metastasis. First therapy to the LEFT lobe. Intention, radiation segmentectomy segments 2 and 3. Bremssstrahlung scan demonstrates activity localized to LEFT hepatic lobe with no extrahepatic activity identified. Electronically Signed   By: Suzy Bouchard M.D.   On: 10/02/2019 13:13   NM FUSION  Result Date: 09/21/2019 INDICATION: 64 year old female with multifocal hepatocellular carcinoma isolated to the lateral segment of the left hepatic lobe. She presents for pre Y 90 arterial mapping. EXAM: 1. Ultrasound-guided access left radial artery 2. Catheterization of the celiac axis with arteriogram 3. Catheterization of the superior mesenteric artery with  arteriogram 4. Catheterization of the replaced common hepatic artery with arteriogram 5. Catheterization of the segment 3/4 left hepatic artery with arteriogram 6. Catheterization of the segment 3 hepatic artery with arteriogram 7. Catheterization of the segment 2 left hepatic artery with arteriogram MEDICATIONS: None ANESTHESIA/SEDATION: Moderate (conscious) sedation was employed during this procedure. A total of Versed 2 mg and Fentanyl 100 mcg was administered intravenously. Moderate Sedation Time: 70 minutes. The patient's level of consciousness and vital signs were monitored continuously by radiology nursing throughout the procedure under my direct  supervision. CONTRAST:  100 mL Isovue 300 FLUOROSCOPY TIME:  Fluoroscopy Time: 17 minutes 30 seconds (1,800 mGy). COMPLICATIONS: None immediate. PROCEDURE: Informed consent was obtained from the patient following explanation of the procedure, risks, benefits and alternatives. The patient understands, agrees and consents for the procedure. All questions were addressed. A time out was performed prior to the initiation of the procedure. Maximal barrier sterile technique utilized including caps, mask, sterile gowns, sterile gloves, large sterile drape, hand hygiene, and Betadine prep. The left radial artery was interrogated with ultrasound and found to be widely patent and of adequate diameter. An image was obtained and stored for the medical record. Local anesthesia was attained by infiltration with 1% lidocaine. Under real-time sonographic guidance, the vessel was punctured with a 21 gauge micropuncture needle. Using standard technique, the initial micro needle was exchanged over a 0.021 micro wire for a hydrophilic 5 French slim arterial sheath. The sheath was flushed and then a radial cocktail consisting of 5000 units heparin, 2.5 mg Verapamil and 200 mcg nitroglycerine was injected through the sheath. An ultimate 2 catheter was advanced over a Bentson wire into the  abdominal aorta. The catheter was used to select the celiac artery. An arteriogram was performed. There is variant hepatic arterial anatomy. The celiac artery gives rise to the splenic artery, left gastric artery and dorsal pancreatic artery. No evidence of hepatic artery. The catheter was next used to select the origin of the superior mesenteric artery. Arteriography was performed. The common hepatic arteries completely replaced to the SMA. A glidewire was successfully advanced into the common hepatic artery and the ultimate 1 catheter advanced over the wire into the artery. Common hepatic arteriography was then performed in multiple obliquities. Prominent supra duodenal artery arising just distal to the gastroduodenal artery. The right gastric artery is visualized coming off the proper hepatic artery. A renegade STC microcatheter was advanced over a Fathom 16 wire and used to select the segment 4/3 left hepatic artery. Arteriography was performed. Two hypervascular tumors are present within the segment 3 distribution. The microcatheter was successfully navigated beyond the bifurcation into the segment 4 and 3 hepatic arteries and into the segment 3 hepatic artery. Additional arteriography was performed. Excellent tumor blush identified. No reflux. No stasis. No evidence of extrahepatic arterial supply. The microcatheter was then brought back into the proper hepatic artery. After several attempts, the segment 2 hepatic artery was successfully catheterized. Arteriography was performed demonstrating hypervascular tumor blush in 2 lesions. No evidence of reflux, stasis or extrahepatic arterial supply. The microcatheter was brought back into the proper hepatic artery. Extensive effort was then made to catheterize the right gastric artery, however this was ultimately unsuccessful. It was felt acceptable to leave the right gastric artery open as treatment would be fairly distal in the segment 2 and 3 hepatic arteries and  the chance of reflux into the right gastric artery is near 0. Therefore, technetium labeled macro aggregated albumin was successfully injected into the left hepatic artery. The catheters were removed. Hemostasis was attained with the assistance of a TR band IMPRESSION: 1. 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 2. 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 3. Successful multivessel angiogram. There are 4 hypervascular lesions in the lateral segment of the left hemi liver, two in segment 2 and two in segment 3. PLAN: Return in 2 weeks for split dose segmentectomy targeting segments 2 and 3.  Signed, Criselda Peaches, MD, [facility] Radiology Electronically Signed   By: Jacqulynn Cadet M.D.   On: 09/21/2019 16:25      ASSESSMENT & PLAN:  1. Hepatocellular carcinoma ([place])   2. History of gout   3. Hepatic cirrhosis, unspecified hepatic cirrhosis type, unspecified whether ascites present [facility])    # Hepatocellular carcinoma, , s/p Y90 hepatic radioembolization to hepatic segment 2 and 3 on 10/02/2019 Patient is clinically doing well. AFP has decreased to 27. I will repeat MRI liver with and without contrast 3 months after her procedure for monitoring of her disease.  Previous CT chest showed left lower lobe opacity.  We will repeat another CT scan in October/November for follow-up.  #Uncontrolled diabetes, A1c in April was 11.6.   Continue follow-up with primary care provider for glycemic control.  #History of finger gout, continue allopurinol.  Clinically doing well. Follow up: In September.  All questions were answered. The patient knows to call the clinic with any problems questions or concerns. Earlie Server, MD, PhD Hematology Oncology [facility] Pager- [phone] 11/23/2019

## 2019-11-23 NOTE — Progress Notes (Signed)
For the past 2 days patient has a new RUQ pain with bending.

## 2019-11-26 ENCOUNTER — Encounter: Payer: Medicare Other | Admitting: Hospice and Palliative Medicine

## 2019-11-28 ENCOUNTER — Telehealth: Payer: Medicare Other | Admitting: Hospice and Palliative Medicine

## 2019-12-04 ENCOUNTER — Other Ambulatory Visit: Payer: Self-pay | Admitting: Interventional Radiology

## 2019-12-04 DIAGNOSIS — C220 Liver cell carcinoma: Secondary | ICD-10-CM

## 2019-12-08 ENCOUNTER — Other Ambulatory Visit: Payer: Self-pay | Admitting: Internal Medicine

## 2019-12-08 DIAGNOSIS — F320 Major depressive disorder, single episode, mild: Secondary | ICD-10-CM

## 2019-12-12 ENCOUNTER — Other Ambulatory Visit: Payer: Self-pay

## 2019-12-20 ENCOUNTER — Telehealth: Payer: Self-pay | Admitting: Primary Care

## 2019-12-20 NOTE — Telephone Encounter (Signed)
T/c to patient to inquire about a confusion with a bill. Caregiver had called to cancel service due to bill which turned out to be a billing error. Message left to please let us know if she'd like further palliative services.

## 2019-12-31 ENCOUNTER — Other Ambulatory Visit: Payer: Medicare Other | Admitting: Primary Care

## 2019-12-31 ENCOUNTER — Other Ambulatory Visit: Payer: Self-pay

## 2020-01-03 ENCOUNTER — Telehealth: Payer: Self-pay | Admitting: Primary Care

## 2020-01-03 ENCOUNTER — Ambulatory Visit
Admission: RE | Admit: 2020-01-03 | Discharge: 2020-01-03 | Disposition: A | Discharge status: Home / Self Care | Payer: Medicare Other | Source: Ambulatory Visit | Attending: Oncology | Admitting: Oncology

## 2020-01-03 ENCOUNTER — Other Ambulatory Visit: Payer: Self-pay

## 2020-01-03 ENCOUNTER — Inpatient Hospital Stay: Payer: Medicare Other | Attending: Oncology

## 2020-01-03 DIAGNOSIS — Z8249 Family history of ischemic heart disease and other diseases of the circulatory system: Secondary | ICD-10-CM | POA: Diagnosis not present

## 2020-01-03 DIAGNOSIS — K7469 Other cirrhosis of liver: Secondary | ICD-10-CM | POA: Diagnosis not present

## 2020-01-03 DIAGNOSIS — D696 Thrombocytopenia, unspecified: Secondary | ICD-10-CM | POA: Diagnosis not present

## 2020-01-03 DIAGNOSIS — E119 Type 2 diabetes mellitus without complications: Secondary | ICD-10-CM | POA: Insufficient documentation

## 2020-01-03 DIAGNOSIS — Z7901 Long term (current) use of anticoagulants: Secondary | ICD-10-CM | POA: Insufficient documentation

## 2020-01-03 DIAGNOSIS — C220 Liver cell carcinoma: Secondary | ICD-10-CM | POA: Insufficient documentation

## 2020-01-03 DIAGNOSIS — E785 Hyperlipidemia, unspecified: Secondary | ICD-10-CM | POA: Diagnosis not present

## 2020-01-03 DIAGNOSIS — Z79899 Other long term (current) drug therapy: Secondary | ICD-10-CM | POA: Diagnosis not present

## 2020-01-03 DIAGNOSIS — N189 Chronic kidney disease, unspecified: Secondary | ICD-10-CM | POA: Diagnosis not present

## 2020-01-03 DIAGNOSIS — Z7984 Long term (current) use of oral hypoglycemic drugs: Secondary | ICD-10-CM | POA: Insufficient documentation

## 2020-01-03 DIAGNOSIS — G40909 Epilepsy, unspecified, not intractable, without status epilepticus: Secondary | ICD-10-CM | POA: Diagnosis not present

## 2020-01-03 DIAGNOSIS — I10 Essential (primary) hypertension: Secondary | ICD-10-CM | POA: Diagnosis not present

## 2020-01-03 DIAGNOSIS — E538 Deficiency of other specified B group vitamins: Secondary | ICD-10-CM | POA: Insufficient documentation

## 2020-01-03 DIAGNOSIS — K766 Portal hypertension: Secondary | ICD-10-CM | POA: Diagnosis not present

## 2020-01-03 DIAGNOSIS — K8020 Calculus of gallbladder without cholecystitis without obstruction: Secondary | ICD-10-CM | POA: Diagnosis not present

## 2020-01-03 LAB — CBC WITH DIFFERENTIAL/PLATELET
Abs Immature Granulocytes: 0.01 10*3/uL (ref 0.00–0.07)
Basophils Absolute: 0 10*3/uL (ref 0.0–0.1)
Basophils Relative: 1 %
Eosinophils Absolute: 0.1 10*3/uL (ref 0.0–0.5)
Eosinophils Relative: 3 %
HCT: 39.1 % (ref 36.0–46.0)
Hemoglobin: 13.2 g/dL (ref 12.0–15.0)
Immature Granulocytes: 0 %
Lymphocytes Relative: 19 %
Lymphs Abs: 1 10*3/uL (ref 0.7–4.0)
MCH: 30.6 pg (ref 26.0–34.0)
MCHC: 33.8 g/dL (ref 30.0–36.0)
MCV: 90.7 fL (ref 80.0–100.0)
Monocytes Absolute: 0.4 10*3/uL (ref 0.1–1.0)
Monocytes Relative: 8 %
Neutro Abs: 3.7 10*3/uL (ref 1.7–7.7)
Neutrophils Relative %: 69 %
Platelets: 139 10*3/uL — ABNORMAL LOW (ref 150–400)
RBC: 4.31 MIL/uL (ref 3.87–5.11)
RDW: 12.4 % (ref 11.5–15.5)
WBC: 5.3 10*3/uL (ref 4.0–10.5)
nRBC: 0 % (ref 0.0–0.2)

## 2020-01-03 LAB — COMPREHENSIVE METABOLIC PANEL
ALT: 15 U/L (ref 0–44)
AST: 22 U/L (ref 15–41)
Albumin: 3.6 g/dL (ref 3.5–5.0)
Alkaline Phosphatase: 96 U/L (ref 38–126)
Anion gap: 10 (ref 5–15)
BUN: 17 mg/dL (ref 8–23)
CO2: 33 mmol/L — ABNORMAL HIGH (ref 22–32)
Calcium: 9.6 mg/dL (ref 8.9–10.3)
Chloride: 98 mmol/L (ref 98–111)
Creatinine, Ser: 1.27 mg/dL — ABNORMAL HIGH (ref 0.44–1.00)
GFR calc Af Amer: 51 mL/min — ABNORMAL LOW (ref >60)
GFR calc non Af Amer: 44 mL/min — ABNORMAL LOW (ref >60)
Glucose, Bld: 298 mg/dL — ABNORMAL HIGH (ref 70–99)
Potassium: 4.3 mmol/L (ref 3.5–5.1)
Sodium: 141 mmol/L (ref 135–145)
Total Bilirubin: 1 mg/dL (ref 0.3–1.2)
Total Protein: 8.9 g/dL — ABNORMAL HIGH (ref 6.5–8.1)

## 2020-01-03 IMAGING — MR MR ABDOMEN WO/W CM
18 series · 46 of 48 positions shown · IV contrast (7.5ml Gadavist)
Comparison: [DATE]

CLINICAL DATA: Hepatocellular carcinoma post radioembolization in
[DATE].

EXAM:
MRI ABDOMEN WITHOUT AND WITH CONTRAST
TECHNIQUE: Multiplanar multisequence MR imaging of the abdomen was performed
both before and after the administration of intravenous contrast.
CONTRAST:  7.5mL GADAVIST GADOBUTROL 1 MMOL/ML IV SOLN

[Series 3: T2 · coronal · 6.0mm · 1.19mm/px · 2 of 30 slices shown (1 of 3)]
[im 1/30]
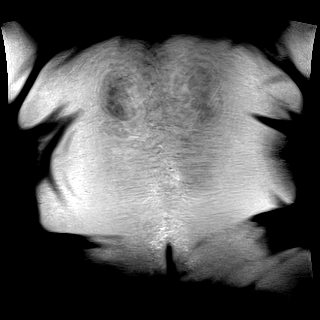
[im 30/30]
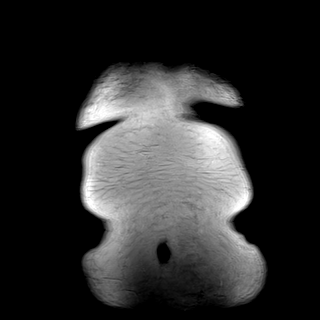

[Series 4: T2 · axial · 6.0mm · 1.19mm/px · z∈[-48,+175]mm · 2 of 32 slices shown (2 of 3)]
[im 1/32]
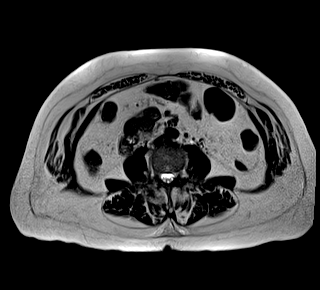
[im 32/32]
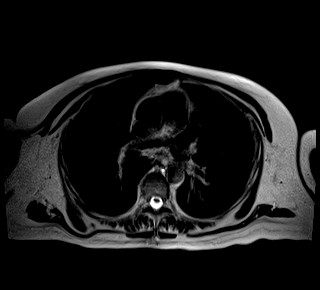

[Series 6: T2 fat-sat · axial · 6.0mm · 1.19mm/px · z∈[-59,+178]mm · 2 of 34 slices shown]
[im 1/34]
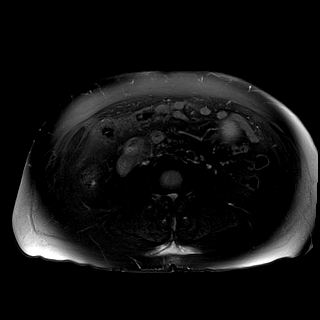
[im 34/34]
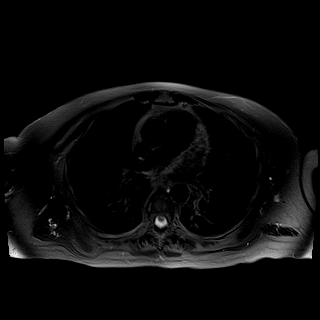

[Series 7: ax dwi_tracew · axial · 6.0mm · 1.42mm/px · z∈[-59,+178]mm · 5 of 102 slices shown]
[im 1/102]
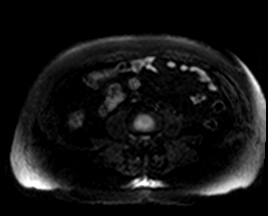
[im 26/102]
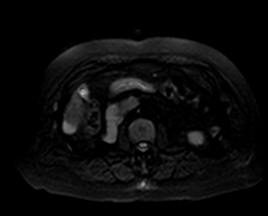
[im 51/102]
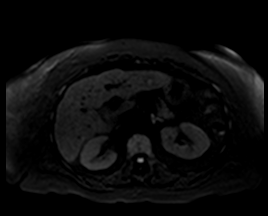
[im 76/102]
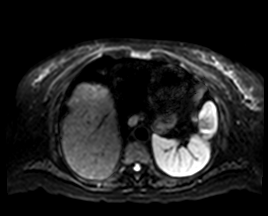
[im 102/102]
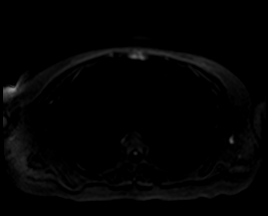

[Series 8: ax dwi_adc · axial · 6.0mm · 1.42mm/px · z∈[-59,+178]mm · 2 of 34 slices shown]
[im 1/34]
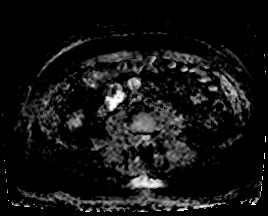
[im 34/34]
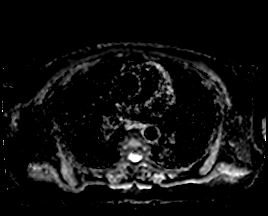

[Series 9: T1 · axial · 6.0mm · 0.74mm/px · z∈[-48,+175]mm · 3 of 64 slices shown]
[im 1/64]
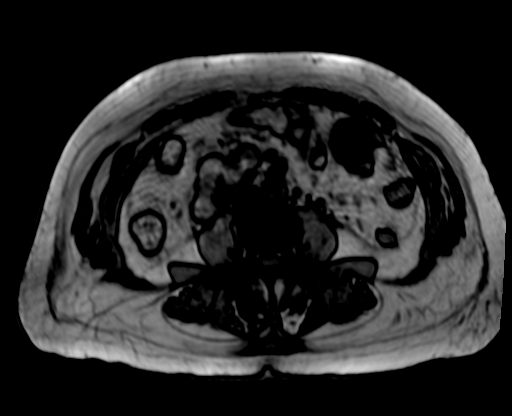
[im 32/64]
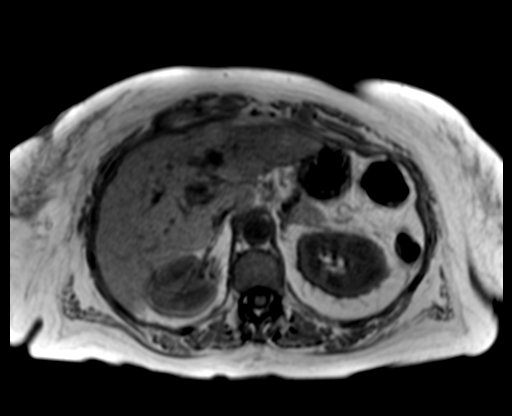
[im 64/64]
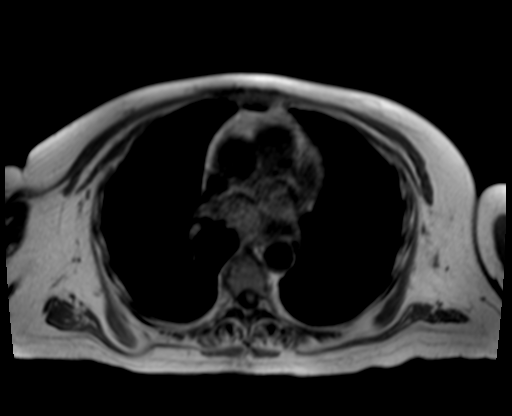

[Series 10: bSSFP · axial · 6.0mm · 0.74mm/px · 1 of 32 slices shown]
[im 1/32]
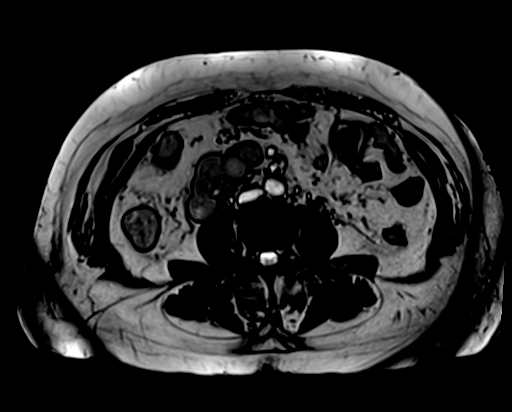

[Series 11: T2 · axial · 6.0mm · 1.19mm/px · 1 of 28 slices shown (3 of 3)]
[im 1/28]
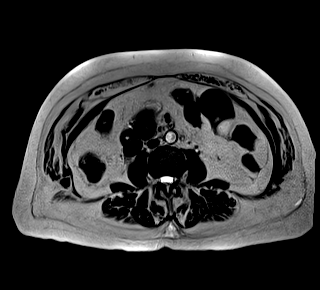

[Series 12: T1 dynamic fat-sat · axial · non-contrast · 3.0mm · 1.19mm/px · z∈[-55,+182]mm · 3 of 80 slices shown (1 of 5)]
[im 1/80]
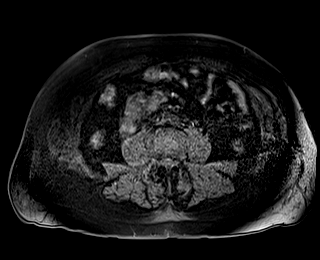
[im 40/80]
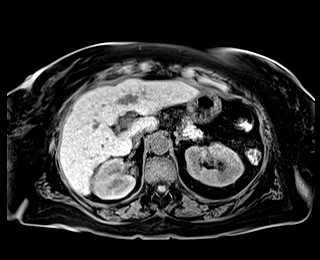
[im 80/80]
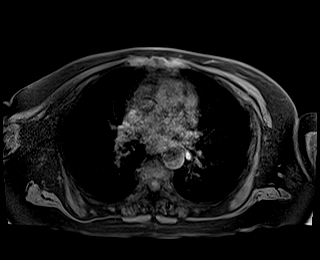

[Series 13: T1 dynamic fat-sat post-contrast · axial · 3.0mm · 1.19mm/px · z∈[-55,+182]mm · 3 of 80 slices shown (1 of 4)]
[im 1/80]
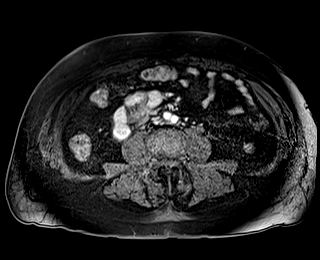
[im 40/80]
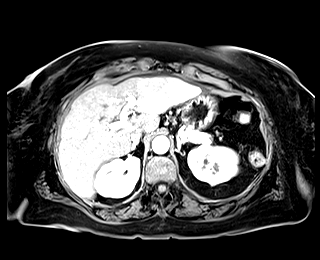
[im 80/80]
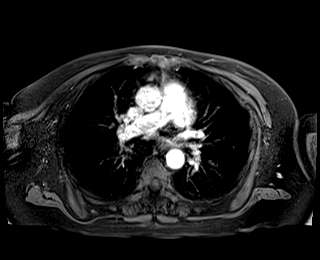

[Series 14: T1 dynamic fat-sat · axial · 3.0mm · 1.19mm/px · z∈[-55,+182]mm · 3 of 80 slices shown (2 of 5)]
[im 1/80]
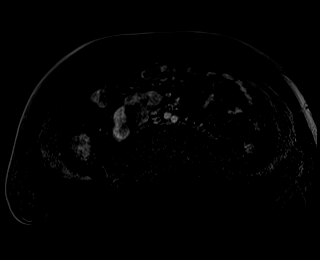
[im 40/80]
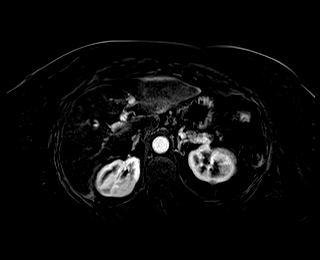
[im 80/80]
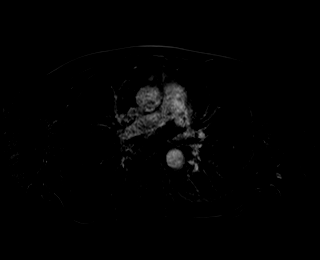

[Series 15: T1 dynamic fat-sat post-contrast · axial · 3.0mm · 1.19mm/px · z∈[-55,+182]mm · 3 of 80 slices shown (2 of 4)]
[im 1/80]
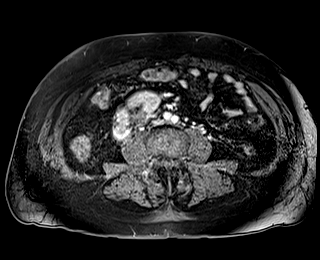
[im 40/80]
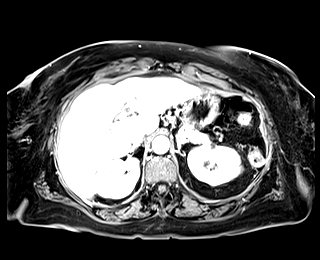
[im 80/80]
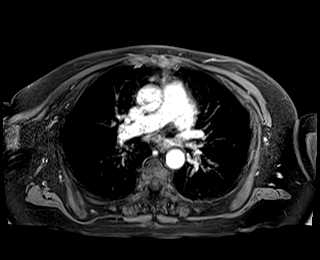

[Series 16: T1 dynamic fat-sat · axial · 3.0mm · 1.19mm/px · z∈[-55,+182]mm · 3 of 80 slices shown (3 of 5)]
[im 1/80]
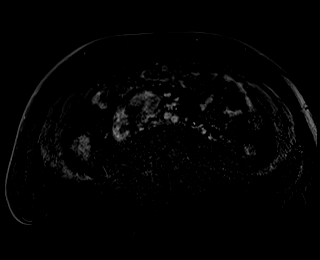
[im 40/80]
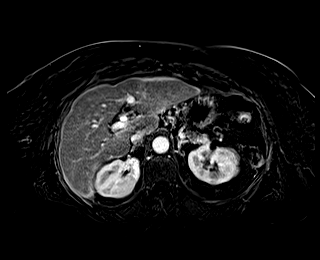
[im 80/80]
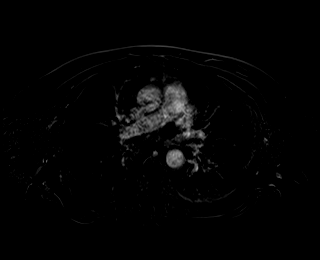

[Series 17: T1 dynamic fat-sat post-contrast · axial · 3.0mm · 1.19mm/px · z∈[-55,+182]mm · 3 of 80 slices shown (3 of 4)]
[im 1/80]
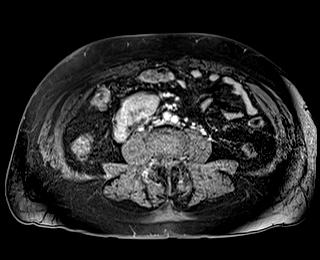
[im 40/80]
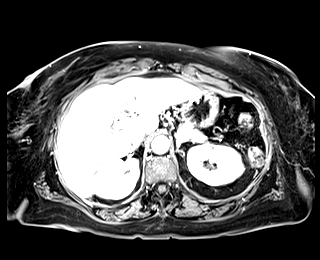
[im 80/80]
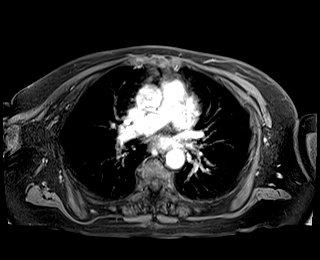

[Series 18: T1 dynamic fat-sat · axial · 3.0mm · 1.19mm/px · z∈[-55,+182]mm · 3 of 80 slices shown (4 of 5)]
[im 1/80]
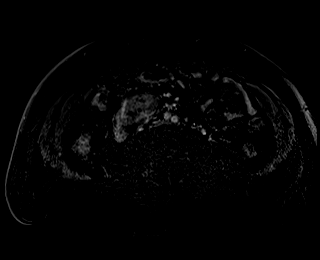
[im 40/80]
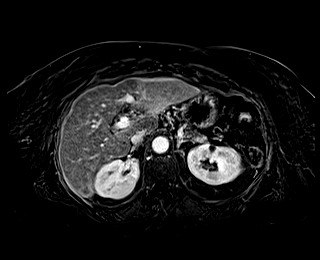
[im 80/80]
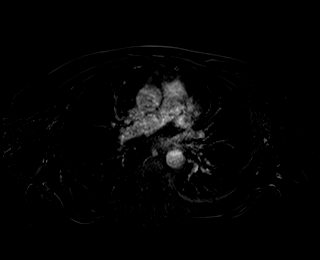

[Series 19: T1 dynamic post-contrast · coronal · 3.0mm · 1.31mm/px · 3 of 72 slices shown]
[im 1/72]
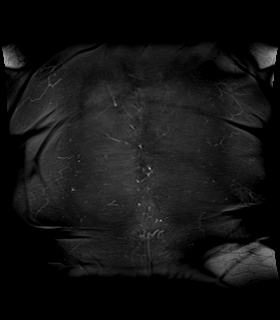
[im 36/72]
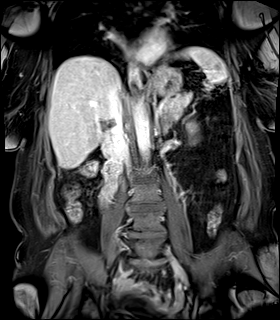
[im 72/72]
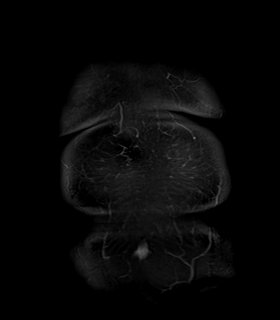

[Series 20: T1 dynamic fat-sat post-contrast · axial · 3.0mm · 1.19mm/px · z∈[-55,+182]mm · 3 of 80 slices shown (4 of 4)]
[im 1/80]
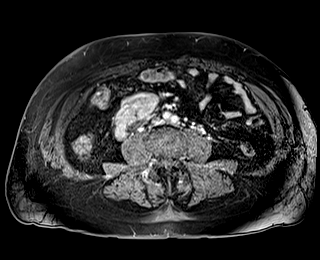
[im 40/80]
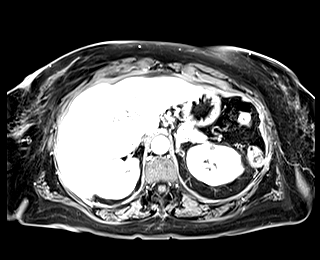
[im 80/80]
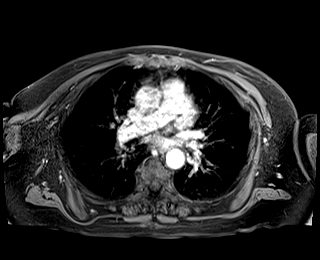

[Series 21: T1 dynamic fat-sat · axial · 3.0mm · 1.19mm/px · 1 of 80 slices shown (5 of 5)]
[im 1/80]
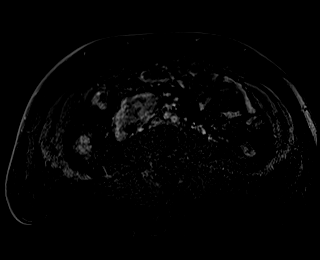

[46 of 48 positions shown; findings below may reference images not displayed]

FINDINGS: Lower chest: No consolidation or effusion. Limited assessment of the
lung bases on MRI.

Hepatobiliary: Cholelithiasis no biliary duct distension.

Signs of cirrhosis as before but with increased T2 signal in the
LEFT hepatic lobe following radioembolization of LEFT hepatic lobe
lesions. Hepatic subsegment II lesion previously showed restricted
diffusion no longer with evidence of restricted diffusion. This area
now measures 1.4 x 1.4 cm and shows no sign of enhancement,
previously measuring approximately 2.0 x 1.5 cm

The second lesion of concern in the inferior aspect of the LEFT
hepatic lobe lateral segment measuring approximately 9 mm greatest
dimension as compared to 1.7 x 1.3 cm. This area still shows
arterial phase enhancement but does not show washout or capsule

Generalized heterogeneous enhancement throughout the lateral segment
of the LEFT hepatic lobe is noted presumably related to radiation in
fact following radioembolization.

Scattered areas of peripheral non masslike hyper vascular
enhancement are similar to the prior study in normalize on later
phase. No new, suspicious abnormality. Portal vein is patent.
Hepatic veins remain patent. Recanalized umbilical vein similar to
the prior study. Likely esophageal varices also unchanged. Mild
splenic enlargement similar to the prior study.

Pancreas: Pancreas with normal intrinsic T1 signal, no ductal
dilation or sign of inflammation. No focal pancreatic lesion.

Spleen: Low signal lesion in the spleen on T1 show cystic
characteristics on T2 measuring 13 mm as compared to approximately 9
mm on the previous exam

Adrenals/Urinary Tract:  Adrenal glands are normal.

Symmetric renal enhancement. No sign of hydronephrosis. No
suspicious renal lesion.

Stomach/Bowel: Gastrointestinal tract without sign of acute process.
Limited imaging of the gastrointestinal tract on today's evaluation

Vascular/Lymphatic: Vascular structures in the abdomen are patent.
No aneurysmal dilation of the abdominal aorta. There is no
gastrohepatic or hepatoduodenal ligament lymphadenopathy. No
retroperitoneal or mesenteric lymphadenopathy.

Other: Small amount of fluid along the inferior margin of the RIGHT
hepatic lobe is unchanged. No gross ascites.

Musculoskeletal: No suspicious bone lesions identified.
IMPRESSION: 1. Signs of cirrhosis and portal hypertension as before.
2. Lesion in hepatic subsegment II LR TR nonviable.
3. Lesion in hepatic subsegment III LR TR equivocal but diminished
in size. Area at diminished in size and still with enhancement but
without signs of washout. Of uncertain significance in the setting
of radioembolization, short interval follow-up may be helpful.
4. Surrounding radiation changes in the LEFT hepatic lobe.
5. No new, suspicious abnormality.
6. Cholelithiasis.

## 2020-01-03 MED ORDER — GADOBUTROL 1 MMOL/ML IV SOLN
7.5000 mL | Freq: Once | INTRAVENOUS | Status: AC | PRN
  Administered 2020-01-03: 7.5 mL via INTRAVENOUS

## 2020-01-03 NOTE — Telephone Encounter (Signed)
T/c to patient to discuss ongoing care and recent billing error. The billing was a mistake, and patient wishes to continue home PC services. I will see in 2-3 weeks. Encouraged to call for any issues.

## 2020-01-04 LAB — AFP TUMOR MARKER: AFP, Serum, Tumor Marker: 12 ng/mL — ABNORMAL HIGH (ref 0.0–8.3)

## 2020-01-08 ENCOUNTER — Inpatient Hospital Stay (HOSPITAL_BASED_OUTPATIENT_CLINIC_OR_DEPARTMENT_OTHER): Payer: Medicare Other | Admitting: Oncology

## 2020-01-08 ENCOUNTER — Encounter: Payer: Self-pay | Admitting: Oncology

## 2020-01-08 ENCOUNTER — Other Ambulatory Visit: Payer: Self-pay

## 2020-01-08 ENCOUNTER — Inpatient Hospital Stay (HOSPITAL_BASED_OUTPATIENT_CLINIC_OR_DEPARTMENT_OTHER): Payer: Medicare Other | Admitting: Hospice and Palliative Medicine

## 2020-01-08 DIAGNOSIS — C220 Liver cell carcinoma: Secondary | ICD-10-CM | POA: Insufficient documentation

## 2020-01-08 DIAGNOSIS — I10 Essential (primary) hypertension: Secondary | ICD-10-CM | POA: Diagnosis not present

## 2020-01-08 DIAGNOSIS — Z515 Encounter for palliative care: Secondary | ICD-10-CM

## 2020-01-08 DIAGNOSIS — E538 Deficiency of other specified B group vitamins: Secondary | ICD-10-CM | POA: Diagnosis not present

## 2020-01-08 DIAGNOSIS — D696 Thrombocytopenia, unspecified: Secondary | ICD-10-CM

## 2020-01-08 DIAGNOSIS — Z7984 Long term (current) use of oral hypoglycemic drugs: Secondary | ICD-10-CM | POA: Diagnosis not present

## 2020-01-08 DIAGNOSIS — Z79899 Other long term (current) drug therapy: Secondary | ICD-10-CM | POA: Diagnosis not present

## 2020-01-08 DIAGNOSIS — N189 Chronic kidney disease, unspecified: Secondary | ICD-10-CM | POA: Diagnosis not present

## 2020-01-08 DIAGNOSIS — E119 Type 2 diabetes mellitus without complications: Secondary | ICD-10-CM | POA: Diagnosis not present

## 2020-01-08 DIAGNOSIS — Z7901 Long term (current) use of anticoagulants: Secondary | ICD-10-CM | POA: Diagnosis not present

## 2020-01-08 DIAGNOSIS — E785 Hyperlipidemia, unspecified: Secondary | ICD-10-CM | POA: Diagnosis not present

## 2020-01-08 DIAGNOSIS — Z8249 Family history of ischemic heart disease and other diseases of the circulatory system: Secondary | ICD-10-CM | POA: Diagnosis not present

## 2020-01-08 NOTE — Progress Notes (Signed)
Patient denies new problems/concerns today.   °

## 2020-01-08 NOTE — Progress Notes (Signed)
Hematology/Oncology follow up  [facility] [phone] [phone]   Patient Care Team: Ronnell Freshwater, NP as PCP - General (Family Medicine) End, Harrell Gave, MD as PCP - Cardiology (Cardiology) Clent Jacks, RN as Oncology Nurse Navigator End, Harrell Gave, MD as Consulting Physician (Cardiology) Jason Coop, NP as Nurse Practitioner (Hospice and Palliative Medicine) Borders, Kirt Boys, NP as Nurse Practitioner (Hospice and Palliative Medicine)  REFERRING PROVIDER: Ronnell Freshwater, NP  CHIEF COMPLAINTS/REASON FOR VISIT:  Follow-up for [facility] HISTORY OF PRESENTING ILLNESS:   Adriana Miller is a  64 y.o.  female with PMH listed below was seen in consultation at the request of  Ronnell Freshwater, NP  for evaluation of elevated protein Patient has a history of alcoholic cirrhosis.  Recent admission due to bloody diarrhea and anemia due to acute blood loss.  She underwent EGD and colonoscopy.  EGD showed gastric bleeding erosion which was cauterized.  Evidence of portal hypertensive gastropathy.  Colonoscopy showed 2 small polyps that were removed. She has stopped drinking alcohol  Her blood work on 03/28/2019 showed protein level of 9.3, total bilirubin 2.4, # 07/23/2019 MRI liver done which showed 2 liver masses both with avid hyper enhancement, both considered LR-5 (definitely [place]).  I referred patient to have been in consultation with Dr. Sheilah Pigeon surgical oncology at [facility] for resectability. Patient was seen by Dr. Maudie Mercury on 08/20/2019.  Patient was deemed not a surgical candidate due to her multiple comorbidities, functional status.  She is not a transplant candidate due to her comorbidities and functional status.  Additionally it appears that patient does not want any major abdominal operations.  Patient was recommended to have local regional therapy.  Patient prefers to stay local to proceed with her therapy.   # #10/02/2019, patient  underwent Y90 hepatic radioembolization   INTERVAL HISTORY Adriana Miller is a 64 y.o. female who has above history reviewed by me today presents for follow up visit for management of [place].  Patient underwent Y 90 hepatic radioembolization in June 2021. Today she reports feeling well.  No new complaints.  Denies any pain, unintentional weight loss, night sweating   Review of Systems  Constitutional: Positive for fatigue. Negative for appetite change, chills, fever and unexpected weight change.  HENT:   Negative for hearing loss and voice change.   Eyes: Negative for eye problems.  Respiratory: Negative for chest tightness and cough.   Cardiovascular: Negative for chest pain.  Gastrointestinal: Negative for abdominal distention, abdominal pain and blood in stool.  Endocrine: Negative for hot flashes.  Genitourinary: Negative for difficulty urinating and frequency.   Musculoskeletal: Positive for arthralgias.  Skin: Negative for itching and rash.  Neurological: Negative for extremity weakness. Speech difficulty:    Hematological: Negative for adenopathy. Does not bruise/bleed easily.  Psychiatric/Behavioral: Negative for confusion.    MEDICAL HISTORY:  Past Medical History:  Diagnosis Date   B12 deficiency 04/05/2017   Blood in stool    Cerebral aneurysm    Chronic kidney disease    Diabetes mellitus without complication ([place])    Gastric erosion with bleeding    Hyperlipidemia    Hypertension    Lower extremity cellulitis 02/08/2019   Seizure disorder [facility])     SURGICAL HISTORY: Past Surgical History:  Procedure Laterality Date   CEREBRAL ANEURYSM REPAIR  1991   COLONOSCOPY WITH PROPOFOL N/A 02/12/2019   Procedure: COLONOSCOPY WITH PROPOFOL;  Surgeon: Lin Landsman, MD;  Location: [facility];  Service: Gastroenterology;  Laterality: N/A;   ESOPHAGOGASTRODUODENOSCOPY (EGD) WITH PROPOFOL N/A 02/12/2019   Procedure: ESOPHAGOGASTRODUODENOSCOPY (EGD)  WITH PROPOFOL;  Surgeon: Lin Landsman, MD;  Location: [facility] ENDOSCOPY;  Service: Gastroenterology;  Laterality: N/A;   IR ANGIOGRAM SELECTIVE EACH ADDITIONAL VESSEL  09/21/2019   IR ANGIOGRAM SELECTIVE EACH ADDITIONAL VESSEL  09/21/2019   IR ANGIOGRAM SELECTIVE EACH ADDITIONAL VESSEL  09/21/2019   IR ANGIOGRAM SELECTIVE EACH ADDITIONAL VESSEL  09/21/2019   IR ANGIOGRAM SELECTIVE EACH ADDITIONAL VESSEL  10/02/2019   IR ANGIOGRAM VISCERAL SELECTIVE  09/21/2019   IR ANGIOGRAM VISCERAL SELECTIVE  09/21/2019   IR ANGIOGRAM VISCERAL SELECTIVE  10/02/2019   IR EMBO TUMOR ORGAN ISCHEMIA INFARCT INC GUIDE ROADMAPPING  10/02/2019   IR EMBO TUMOR ORGAN ISCHEMIA INFARCT INC GUIDE ROADMAPPING  10/02/2019   IR RADIOLOGIST EVAL & MGMT  08/28/2019   IR US GUIDE VASC ACCESS LEFT  09/21/2019   IR US GUIDE VASC ACCESS LEFT  10/02/2019    SOCIAL HISTORY: Social History   Socioeconomic History   Marital status: Divorced    Spouse name: Not on file   Number of children: Not on file   Years of education: Not on file   Highest education level: Not on file  Occupational History   Not on file  Tobacco Use   Smoking status: Never Smoker   Smokeless tobacco: Current User    Types: Snuff  Vaping Use   Vaping Use: Never used  Substance and Sexual Activity   Alcohol use: Not Currently    Alcohol/week: 14.0 standard drinks    Types: 14 Cans of beer per week    Comment: patient quit in october 2020   Drug use: No   Sexual activity: Not Currently  Other Topics Concern   Not on file  Social History Narrative   Not on file   Social Determinants of Health   Financial Resource Strain:    Difficulty of Paying Living Expenses: Not on file  Food Insecurity:    Worried About [place] in the Last Year: Not on file   [place] in the Last Year: Not on file  Transportation Needs:    Lack of Transportation (Medical): Not on file   Lack of Transportation (Non-Medical): Not on  file  Physical Activity:    Days of Exercise per Week: Not on file   Minutes of Exercise per Session: Not on file  Stress:    Feeling of Stress : Not on file  Social Connections:    Frequency of Communication with Friends and Family: Not on file   Frequency of Social Gatherings with Friends and Family: Not on file   Attends Religious Services: Not on file   Active Member of Clubs or Organizations: Not on file   Attends Archivist Meetings: Not on file   Marital Status: Not on file  Intimate Partner Violence:    Fear of Current or Ex-Partner: Not on file   Emotionally Abused: Not on file   Physically Abused: Not on file   Sexually Abused: Not on file    FAMILY HISTORY: Family History  Problem Relation Age of Onset   Heart attack Brother 30    ALLERGIES:  is allergic to aspirin.  MEDICATIONS:  Current Outpatient Medications  Medication Sig Dispense Refill   allopurinol (ZYLOPRIM) 100 MG tablet Take 1 tablet (100 mg total) by mouth daily. 90 tablet 1   apixaban (ELIQUIS) 5 MG TABS tablet Take 1 tablet (  5 mg total) by mouth 2 (two) times daily. 180 tablet 2   diltiazem (CARTIA XT) 240 MG 24 hr capsule Take 1 capsule (240 mg total) by mouth daily. 30 capsule 11   escitalopram (LEXAPRO) 10 MG tablet TAKE 1 TABLET (10 MG TOTAL) BY MOUTH DAILY. FOR ANXIETY/SLEEP 90 tablet 2   furosemide (LASIX) 20 MG tablet Take 1 tablet (20 mg total) by mouth daily. 30 tablet 0   glimepiride (AMARYL) 1 MG tablet Take 1 tablet (1 mg total) by mouth every morning. 90 tablet 0   metFORMIN (GLUCOPHAGE) 500 MG tablet Take 1 tablet (500 mg total) by mouth daily with breakfast. 90 tablet 0   metoprolol tartrate (LOPRESSOR) 25 MG tablet Take 25 mg by mouth daily.     spironolactone (ALDACTONE) 25 MG tablet Take 1 tablet (25 mg total) by mouth daily. For cirrhosis of liver 90 tablet 2   traZODone (DESYREL) 50 MG tablet Take 0.5-1 tablets (25-50 mg total) by mouth at bedtime  as needed for sleep. 30 tablet 2   No current facility-administered medications for this visit.     PHYSICAL EXAMINATION: ECOG PERFORMANCE STATUS: 1 - Symptomatic but completely ambulatory There were no vitals filed for this visit. There were no vitals filed for this visit. Vital sign please see Epic Physical Exam Constitutional:      General: She is not in acute distress.    Comments: Patient walks independantly  HENT:     Head: Normocephalic and atraumatic.  Eyes:     General: No scleral icterus. Cardiovascular:     Rate and Rhythm: Normal rate and regular rhythm.     Heart sounds: Normal heart sounds.  Pulmonary:     Effort: Pulmonary effort is normal. No respiratory distress.     Breath sounds: No wheezing.  Abdominal:     General: Bowel sounds are normal. There is no distension.     Palpations: Abdomen is soft.  Musculoskeletal:        General: No tenderness or deformity. Normal range of motion.     Cervical back: Normal range of motion and neck supple.  Skin:    General: Skin is warm and dry.     Findings: No erythema or rash.  Neurological:     Mental Status: She is alert and oriented to person, place, and time. Mental status is at baseline.     Cranial Nerves: No cranial nerve deficit.     Coordination: Coordination normal.  Psychiatric:        Mood and Affect: Mood normal.     LABORATORY DATA:  I have reviewed the data as listed Lab Results  Component Value Date   WBC 5.3 01/03/2020   HGB 13.2 01/03/2020   HCT 39.1 01/03/2020   MCV 90.7 01/03/2020   PLT 139 (L) 01/03/2020   Recent Labs    10/02/19 0840 11/22/19 1308 01/03/20 1015  NA 141 137 141  K 3.8 3.4* 4.3  CL 103 99 98  CO2 27 26 33*  GLUCOSE 147* 195* 298*  BUN 20 24* 17  CREATININE 1.19* 1.26* 1.27*  CALCIUM 9.4 9.1 9.6  GFRNONAA 48* 44* 44*  GFRAA 55* 51* 51*  PROT 8.7* 8.7* 8.9*  ALBUMIN 3.6 3.7 3.6  AST 23 24 22   ALT 15 13 15   ALKPHOS 89 102 96  BILITOT 0.6 0.6 1.0    Iron/TIBC/Ferritin/ %Sat    Component Value Date/Time   IRON 76 02/11/2019 0616   TIBC 122 (L) 02/11/2019  3474   QVZDGLOV 564 (H) 02/11/2019 0616   IRONPCTSAT 62 (H) 02/11/2019 3329      RADIOGRAPHIC STUDIES: I have personally reviewed the radiological images as listed and agreed with the findings in the report. MR LIVER W WO CONTRAST  Result Date: 01/03/2020 CLINICAL DATA:  Hepatocellular carcinoma post radioembolization in June of 2021. EXAM: MRI ABDOMEN WITHOUT AND WITH CONTRAST TECHNIQUE: Multiplanar multisequence MR imaging of the abdomen was performed both before and after the administration of intravenous contrast. CONTRAST:  7.9mL GADAVIST GADOBUTROL 1 MMOL/ML IV SOLN COMPARISON:  April of 2021 FINDINGS: Lower chest: No consolidation or effusion. Limited assessment of the lung bases on MRI. Hepatobiliary: Cholelithiasis no biliary duct distension. Signs of cirrhosis as before but with increased T2 signal in the LEFT hepatic lobe following radioembolization of LEFT hepatic lobe lesions. Hepatic subsegment II lesion previously showed restricted diffusion no longer with evidence of restricted diffusion. This area now measures 1.4 x 1.4 cm and shows no sign of enhancement, previously measuring approximately 2.0 x 1.5 cm The second lesion of concern in the inferior aspect of the LEFT hepatic lobe lateral segment measuring approximately 9 mm greatest dimension as compared to 1.7 x 1.3 cm. This area still shows arterial phase enhancement but does not show washout or capsule Generalized heterogeneous enhancement throughout the lateral segment of the LEFT hepatic lobe is noted presumably related to radiation in fact following radioembolization. Scattered areas of peripheral non masslike hyper vascular enhancement are similar to the prior study in normalize on later phase. No new, suspicious abnormality. Portal vein is patent. Hepatic veins remain patent. Recanalized umbilical vein similar to the  prior study. Likely esophageal varices also unchanged. Mild splenic enlargement similar to the prior study. Pancreas: Pancreas with normal intrinsic T1 signal, no ductal dilation or sign of inflammation. No focal pancreatic lesion. Spleen: Low signal lesion in the spleen on T1 show cystic characteristics on T2 measuring 13 mm as compared to approximately 9 mm on the previous exam Adrenals/Urinary Tract:  Adrenal glands are normal. Symmetric renal enhancement. No sign of hydronephrosis. No suspicious renal lesion. Stomach/Bowel: Gastrointestinal tract without sign of acute process. Limited imaging of the gastrointestinal tract on today's evaluation Vascular/Lymphatic: Vascular structures in the abdomen are patent. No aneurysmal dilation of the abdominal aorta. There is no gastrohepatic or hepatoduodenal ligament lymphadenopathy. No retroperitoneal or mesenteric lymphadenopathy. Other: Small amount of fluid along the inferior margin of the RIGHT hepatic lobe is unchanged. No gross ascites. Musculoskeletal: No suspicious bone lesions identified. IMPRESSION: 1. Signs of cirrhosis and portal hypertension as before. 2. Lesion in hepatic subsegment II LR TR nonviable. 3. Lesion in hepatic subsegment III LR TR equivocal but diminished in size. Area at diminished in size and still with enhancement but without signs of washout. Of uncertain significance in the setting of radioembolization, short interval follow-up may be helpful. 4. Surrounding radiation changes in the LEFT hepatic lobe. 5. No new, suspicious abnormality. 6. Cholelithiasis. Electronically Signed   By: Zetta Bills M.D.   On: 01/03/2020 15:27      ASSESSMENT & PLAN:  1. Hepatocellular carcinoma ([place])   2. Thrombocytopenia ([place])   Cancer Staging Hepatocellular carcinoma ([place]) Staging form: Liver, AJCC 8th Edition - Clinical: Stage II (cT2, cN0, cM0) - Signed by Earlie Server, MD on 01/08/2020   # Stage II Hepatocellular carcinoma, , s/p Y90 hepatic  radioembolization to hepatic segment 2 and 3 on 10/02/2019 Patient is clinically doing well. AFP has decreased to 12. MRI liver with  and without contrast was independently reviewed by me and discussed with patient Lesion in the hepatic subsegment II LR TR nonviable Lesion in the hepatic subsegment III LR TR equivocal but diminished in size.  No new suspicious abnormality.  Cholelithiasis and chronic cirrhosis.  Recommend short term image surveillance.  # diabetes, A1c in July 2021 has improved 7.    Continue follow-up with primary care provider for glycemic control.  # Thrombocytopenia, stable.   Follow up: 3 months.  All questions were answered. The patient knows to call the clinic with any problems questions or concerns. Earlie Server, MD, PhD Hematology Oncology [facility] Pager- [phone] 01/08/2020

## 2020-01-08 NOTE — Progress Notes (Signed)
[facility]  [phone] [phone]   Name: Adriana Miller Date: 01/08/2020 MRN: [id number]  DOB: [birth year]  Patient Care Team: Ronnell Freshwater, NP as PCP - General (Family Medicine) End, Harrell Gave, MD as PCP - Cardiology (Cardiology) Clent Jacks, RN as Oncology Nurse Navigator End, Harrell Gave, MD as Consulting Physician (Cardiology) Jason Coop, NP as Nurse Practitioner (Hospice and Palliative Medicine) Ahnika Hannibal, Kirt Boys, NP as Nurse Practitioner (Hospice and Palliative Medicine)    REASON FOR CONSULTATION: Adriana Miller is a 64 y.o. female with multiple medical problems including Nucla, cirrhosis secondary to alcohol abuse, history of gastric erosion with bleeding, CKD, history of poorly controlled diabetes, cerebral aneurysm, and seizure disorder.  Patient's HCC was deemed not surgically resectable nor was patient felt to be a transplant candidate.  Patient was referred for consideration of embolization.  Patient has relatively poor performance status with limited social support.  Appear to help address goals and manage ongoing symptoms.  SOCIAL HISTORY:     reports that she has never smoked. Her smokeless tobacco use includes snuff. She reports previous alcohol use of about 14.0 standard drinks of alcohol per week. She reports that she does not use drugs.   Patient is divorced.  She lives at home alone.  She has a Medicaid CNA 2 hours/day.  She has a brother who lives nearby.  She has a son and granddaughter and [place].  Patient is originally from [place] but moved here about 7 years ago to be near her brother.  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  ADVANCE DIRECTIVES:  Does not have  CODE STATUS: DNR  PAST MEDICAL HISTORY: Past Medical History:  Diagnosis Date  . B12 deficiency 04/05/2017  . Blood in stool   . Cerebral aneurysm   . Chronic  kidney disease   . Diabetes mellitus without complication ([place])   . Gastric erosion with bleeding   . Hyperlipidemia   . Hypertension   . Lower extremity cellulitis 02/08/2019  . Seizure disorder ([place])     PAST SURGICAL HISTORY:  Past Surgical History:  Procedure Laterality Date  . CEREBRAL ANEURYSM REPAIR  1991  . COLONOSCOPY WITH PROPOFOL N/A 02/12/2019   Procedure: COLONOSCOPY WITH PROPOFOL;  Surgeon: Lin Landsman, MD;  Location: [facility] ENDOSCOPY;  Service: Gastroenterology;  Laterality: N/A;  . ESOPHAGOGASTRODUODENOSCOPY (EGD) WITH PROPOFOL N/A 02/12/2019   Procedure: ESOPHAGOGASTRODUODENOSCOPY (EGD) WITH PROPOFOL;  Surgeon: Lin Landsman, MD;  Location: [facility] ENDOSCOPY;  Service: Gastroenterology;  Laterality: N/A;  . IR ANGIOGRAM SELECTIVE EACH ADDITIONAL VESSEL  09/21/2019  . IR ANGIOGRAM SELECTIVE EACH ADDITIONAL VESSEL  09/21/2019  . IR ANGIOGRAM SELECTIVE EACH ADDITIONAL VESSEL  09/21/2019  . IR ANGIOGRAM SELECTIVE EACH ADDITIONAL VESSEL  09/21/2019  . IR ANGIOGRAM SELECTIVE EACH ADDITIONAL VESSEL  10/02/2019  . IR ANGIOGRAM VISCERAL SELECTIVE  09/21/2019  . IR ANGIOGRAM VISCERAL SELECTIVE  09/21/2019  . IR ANGIOGRAM VISCERAL SELECTIVE  10/02/2019  . IR EMBO TUMOR ORGAN ISCHEMIA INFARCT INC GUIDE ROADMAPPING  10/02/2019  . IR EMBO TUMOR ORGAN ISCHEMIA INFARCT INC GUIDE ROADMAPPING  10/02/2019  . IR RADIOLOGIST EVAL & MGMT  08/28/2019  . IR US GUIDE VASC ACCESS LEFT  09/21/2019  . IR US GUIDE VASC ACCESS LEFT  10/02/2019    HEMATOLOGY/ONCOLOGY HISTORY:  Oncology History   No history exists.    ALLERGIES:  is allergic to aspirin.  MEDICATIONS:  Current  Outpatient Medications  Medication Sig Dispense Refill  . allopurinol (ZYLOPRIM) 100 MG tablet Take 1 tablet (100 mg total) by mouth daily. 90 tablet 1  . apixaban (ELIQUIS) 5 MG TABS tablet Take 1 tablet (5 mg total) by mouth 2 (two) times daily. 180 tablet 2  . diltiazem (CARTIA XT) 240 MG 24 hr capsule Take 1 capsule (240  mg total) by mouth daily. 30 capsule 11  . escitalopram (LEXAPRO) 10 MG tablet TAKE 1 TABLET (10 MG TOTAL) BY MOUTH DAILY. FOR ANXIETY/SLEEP 90 tablet 2  . furosemide (LASIX) 20 MG tablet Take 1 tablet (20 mg total) by mouth daily. 30 tablet 0  . glimepiride (AMARYL) 1 MG tablet Take 1 tablet (1 mg total) by mouth every morning. 90 tablet 0  . metFORMIN (GLUCOPHAGE) 500 MG tablet Take 1 tablet (500 mg total) by mouth daily with breakfast. 90 tablet 0  . metoprolol tartrate (LOPRESSOR) 25 MG tablet Take 25 mg by mouth daily.    Marland Kitchen spironolactone (ALDACTONE) 25 MG tablet Take 1 tablet (25 mg total) by mouth daily. For cirrhosis of liver 90 tablet 2  . traZODone (DESYREL) 50 MG tablet Take 0.5-1 tablets (25-50 mg total) by mouth at bedtime as needed for sleep. 30 tablet 2   No current facility-administered medications for this visit.    VITAL SIGNS: There were no vitals taken for this visit. There were no vitals filed for this visit.  Estimated body mass index is 27.79 kg/m as calculated from the following:   Height as of 11/01/19: 5\' 6"  (1.676 m).   Weight as of 11/23/19: 172 lb 3.2 oz (78.1 kg).  LABS: CBC:    Component Value Date/Time   WBC 5.3 01/03/2020 1015   HGB 13.2 01/03/2020 1015   HGB 12.8 06/22/2019 1546   HCT 39.1 01/03/2020 1015   HCT 36.8 06/22/2019 1546   PLT 139 (L) 01/03/2020 1015   PLT 173 06/22/2019 1546   MCV 90.7 01/03/2020 1015   MCV 92 06/22/2019 1546   MCV 98 10/11/2013 1705   NEUTROABS 3.7 01/03/2020 1015   NEUTROABS 3.6 06/22/2019 1546   LYMPHSABS 1.0 01/03/2020 1015   LYMPHSABS 1.4 06/22/2019 1546   MONOABS 0.4 01/03/2020 1015   EOSABS 0.1 01/03/2020 1015   EOSABS 0.1 06/22/2019 1546   BASOSABS 0.0 01/03/2020 1015   BASOSABS 0.0 06/22/2019 1546   Comprehensive Metabolic Panel:    Component Value Date/Time   NA 141 01/03/2020 1015   NA 140 06/06/2019 1538   NA 138 10/11/2013 1705   K 4.3 01/03/2020 1015   K 3.9 10/11/2013 1705   CL 98 01/03/2020  1015   CL 106 10/11/2013 1705   CO2 33 (H) 01/03/2020 1015   CO2 24 10/11/2013 1705   BUN 17 01/03/2020 1015   BUN 24 06/06/2019 1538   BUN 13 10/11/2013 1705   CREATININE 1.27 (H) 01/03/2020 1015   CREATININE 0.87 10/11/2013 1705   GLUCOSE 298 (H) 01/03/2020 1015   GLUCOSE 235 (H) 10/11/2013 1705   CALCIUM 9.6 01/03/2020 1015   CALCIUM 8.4 (L) 10/11/2013 1705   AST 22 01/03/2020 1015   ALT 15 01/03/2020 1015   ALKPHOS 96 01/03/2020 1015   BILITOT 1.0 01/03/2020 1015   BILITOT 0.5 01/27/2018 1340   PROT 8.9 (H) 01/03/2020 1015   PROT 8.4 01/27/2018 1340   ALBUMIN 3.6 01/03/2020 1015   ALBUMIN 4.2 01/27/2018 1340    RADIOGRAPHIC STUDIES: MR LIVER W WO CONTRAST  Result Date: 01/03/2020 CLINICAL  DATA:  Hepatocellular carcinoma post radioembolization in June of 2021. EXAM: MRI ABDOMEN WITHOUT AND WITH CONTRAST TECHNIQUE: Multiplanar multisequence MR imaging of the abdomen was performed both before and after the administration of intravenous contrast. CONTRAST:  7.61mL GADAVIST GADOBUTROL 1 MMOL/ML IV SOLN COMPARISON:  April of 2021 FINDINGS: Lower chest: No consolidation or effusion. Limited assessment of the lung bases on MRI. Hepatobiliary: Cholelithiasis no biliary duct distension. Signs of cirrhosis as before but with increased T2 signal in the LEFT hepatic lobe following radioembolization of LEFT hepatic lobe lesions. Hepatic subsegment II lesion previously showed restricted diffusion no longer with evidence of restricted diffusion. This area now measures 1.4 x 1.4 cm and shows no sign of enhancement, previously measuring approximately 2.0 x 1.5 cm The second lesion of concern in the inferior aspect of the LEFT hepatic lobe lateral segment measuring approximately 9 mm greatest dimension as compared to 1.7 x 1.3 cm. This area still shows arterial phase enhancement but does not show washout or capsule Generalized heterogeneous enhancement throughout the lateral segment of the LEFT hepatic  lobe is noted presumably related to radiation in fact following radioembolization. Scattered areas of peripheral non masslike hyper vascular enhancement are similar to the prior study in normalize on later phase. No new, suspicious abnormality. Portal vein is patent. Hepatic veins remain patent. Recanalized umbilical vein similar to the prior study. Likely esophageal varices also unchanged. Mild splenic enlargement similar to the prior study. Pancreas: Pancreas with normal intrinsic T1 signal, no ductal dilation or sign of inflammation. No focal pancreatic lesion. Spleen: Low signal lesion in the spleen on T1 show cystic characteristics on T2 measuring 13 mm as compared to approximately 9 mm on the previous exam Adrenals/Urinary Tract:  Adrenal glands are normal. Symmetric renal enhancement. No sign of hydronephrosis. No suspicious renal lesion. Stomach/Bowel: Gastrointestinal tract without sign of acute process. Limited imaging of the gastrointestinal tract on today's evaluation Vascular/Lymphatic: Vascular structures in the abdomen are patent. No aneurysmal dilation of the abdominal aorta. There is no gastrohepatic or hepatoduodenal ligament lymphadenopathy. No retroperitoneal or mesenteric lymphadenopathy. Other: Small amount of fluid along the inferior margin of the RIGHT hepatic lobe is unchanged. No gross ascites. Musculoskeletal: No suspicious bone lesions identified. IMPRESSION: 1. Signs of cirrhosis and portal hypertension as before. 2. Lesion in hepatic subsegment II LR TR nonviable. 3. Lesion in hepatic subsegment III LR TR equivocal but diminished in size. Area at diminished in size and still with enhancement but without signs of washout. Of uncertain significance in the setting of radioembolization, short interval follow-up may be helpful. 4. Surrounding radiation changes in the LEFT hepatic lobe. 5. No new, suspicious abnormality. 6. Cholelithiasis. Electronically Signed   By: Zetta Bills M.D.   On:  01/03/2020 15:27    PERFORMANCE STATUS (ECOG) : 2 - Symptomatic, <50% confined to bed  Review of Systems Unless otherwise noted, a complete review of systems is negative.  Physical Exam General: NAD, frail appearing Pulmonary: Unlabored Extremities: no edema, no joint deformities Skin: no rashes Neurological: Weakness but otherwise nonfocal  IMPRESSION: Routine follow-up visit.  Patient reports she is doing well.  She denies any significant changes or concerns.  She denies any symptomatic complaints.  She says things are going well at home.  She continues to be followed by a CNA.  Appreciate home-based palliative care support.  Abdominal MRI on 9/16 revealed essentially stable disease.  AFP's have been downtrending.  Plan is for active surveillance.  Discussed with Dr. Tasia Catchings who also  saw patient today.   PLAN: -Best supportive care -Continue trazodone 25 to 50 mg nightly as needed for insomnia -RTC 1 to 2 months   Patient expressed understanding and was in agreement with this plan. She also understands that She can call the clinic at any time with any questions, concerns, or complaints.     Time Total: 15 minutes  Visit consisted of counseling and education dealing with the complex and emotionally intense issues of symptom management and palliative care in the setting of serious and potentially life-threatening illness.Greater than 50%  of this time was spent counseling and coordinating care related to the above assessment and plan.  Signed by: Altha Harm, PhD, NP-C

## 2020-01-10 ENCOUNTER — Other Ambulatory Visit: Payer: Self-pay | Admitting: Emergency Medicine

## 2020-01-12 ENCOUNTER — Other Ambulatory Visit: Payer: Self-pay | Admitting: Family

## 2020-01-17 ENCOUNTER — Other Ambulatory Visit: Payer: Self-pay

## 2020-01-17 ENCOUNTER — Other Ambulatory Visit: Payer: Medicare Other | Admitting: Primary Care

## 2020-01-17 DIAGNOSIS — Z515 Encounter for palliative care: Secondary | ICD-10-CM | POA: Diagnosis not present

## 2020-01-17 DIAGNOSIS — I5032 Chronic diastolic (congestive) heart failure: Secondary | ICD-10-CM

## 2020-01-17 DIAGNOSIS — K7030 Alcoholic cirrhosis of liver without ascites: Secondary | ICD-10-CM

## 2020-01-17 DIAGNOSIS — I5023 Acute on chronic systolic (congestive) heart failure: Secondary | ICD-10-CM | POA: Diagnosis not present

## 2020-01-17 NOTE — Progress Notes (Addendum)
[facility] Consult Note Telephone: [phone]  Fax: [phone]  PATIENT NAME: Adriana Miller [address] [place] [address] [phone] (home)  DOB: Nov 01, 1955 MRN: [id number]  PRIMARY CARE PROVIDER:    Ronnell Freshwater, NP,  [address] [place] [address] [phone]  REFERRING PROVIDER:  Irean Hong, NP [place],   [id number]  RESPONSIBLE PARTY:   Extended Emergency Contact Information Primary Emergency Contact: Winter Phone: [phone] Mobile Phone: [phone] Relation: Brother  I met face to face with patient in home.  ASSESSMENT AND RECOMMENDATIONS:   1. Advance Care Planning/Goals of Care: Goals include to maximize quality of life and symptom management. ACP includes full scope of care. Discussed getting flu and covid injections. She's interested in 3rd covid shot, she had Moderna in May and June 2021. She declines the flu injection   2. Symptom Management:   Mood: She has an aide for transport and uses [facility]. Discussed having friends who were drinkers and this loss since she has stopped. She went to [place] briefly. She does not miss seeing people. Her aide takes her places for errands. She has family and states she's a homebody. Mood is good.  States she recently saw MD and had a good report.She will see MD again in Dec. She denies untoward symptoms and endorses good appetite.   Nutrition: reviewed good food for diabetic diet. A 1 C 7%. Reviewed diabetic medications.  Pain: Some in abdomen due to position of bending. Denies gi sx.  3. Follow up Palliative Care Visit: Palliative care will continue to follow for goals of care clarification and symptom management. Return 6-8 weeks or prn.  4. Family /Caregiver/Community Supports: Lives alone in subsidized  Housing. States she has a personal alarm for falls. Brother is POA and in area.  5. Cognitive /  Functional decline: A and O x 3, forgetful. Able to ambulate (I). Needs some help with adls and iadl.  I spent 40 minutes providing this consultation,  from 1245 to 1325. More than 50% of the time in this consultation was spent coordinating communication.   CHIEF COMPLAINT:  Debility.  HISTORY OF PRESENT ILLNESS:  Adriana Miller is a 64 y.o. year old female with multiple medical problems including DM, CKD, a-fib, cirrhosis secondary to alcohol abuse. Palliative Care was asked to follow this patient by consultation request of [place], Kirt Boys, NP  to help address advance care planning and goals of care. This is a follow up visit.  CODE STATUS: FULL CODE  PPS: 50%  HOSPICE ELIGIBILITY/DIAGNOSIS: no PAST MEDICAL HISTORY:  Past Medical History:  Diagnosis Date  . B12 deficiency 04/05/2017  . Blood in stool   . Cerebral aneurysm   . Chronic kidney disease   . Diabetes mellitus without complication ([place])   . Gastric erosion with bleeding   . Hyperlipidemia   . Hypertension   . Lower extremity cellulitis 02/08/2019  . Seizure disorder [facility])     SOCIAL HX:  Social History   Tobacco Use  . Smoking status: Never Smoker  . Smokeless tobacco: Current User    Types: Snuff  Substance Use Topics  . Alcohol use: Not Currently    Alcohol/week: 14.0 standard drinks    Types: 14 Cans of beer per week    Comment: patient quit in october 2020   FAMILY HX:  Family History  Problem Relation Age of Onset  . Heart attack Brother  63    ALLERGIES:  Allergies  Allergen Reactions  . Aspirin Other (See Comments)    Nose bleed     PERTINENT MEDICATIONS:  Outpatient Encounter Medications as of 01/17/2020  Medication Sig  . allopurinol (ZYLOPRIM) 100 MG tablet Take 1 tablet (100 mg total) by mouth daily.  Marland Kitchen apixaban (ELIQUIS) 5 MG TABS tablet Take 1 tablet (5 mg total) by mouth 2 (two) times daily.  Marland Kitchen diltiazem (CARTIA XT) 240 MG 24 hr capsule Take 1 capsule (240 mg total) by mouth daily.    Marland Kitchen escitalopram (LEXAPRO) 10 MG tablet TAKE 1 TABLET (10 MG TOTAL) BY MOUTH DAILY. FOR ANXIETY/SLEEP  . furosemide (LASIX) 20 MG tablet Take 1 tablet (20 mg total) by mouth daily.  Marland Kitchen glimepiride (AMARYL) 1 MG tablet Take 1 tablet (1 mg total) by mouth every morning.  . metFORMIN (GLUCOPHAGE) 500 MG tablet Take 1 tablet (500 mg total) by mouth daily with breakfast.  . metoprolol tartrate (LOPRESSOR) 25 MG tablet TAKE 1/2 TABLET BY MOUTH TWICE A DAY  . spironolactone (ALDACTONE) 25 MG tablet Take 1 tablet (25 mg total) by mouth daily. For cirrhosis of liver  . traZODone (DESYREL) 50 MG tablet Take 0.5-1 tablets (25-50 mg total) by mouth at bedtime as needed for sleep.   No facility-administered encounter medications on file as of 01/17/2020.    PHYSICAL EXAM / ROS:   Current and past weights: 173 lbs General: NAD, frail appearing, WNWD Cardiovascular: no chest pain reported, no edema  Pulmonary: no cough, no increased SOB, room air Abdomen: appetite good, denies constipation, continent of bowel GU: denies dysuria, continent of urine MSK:  no joint and ROM abnormalities, ambulatory (I), Denies recent falls Skin: lesion on R FA from scratching Neurological: Weakness, no pain, endorses occ insomnia  Jason Coop, NP , DNP, MPH, [facility]  COVID-19 PATIENT SCREENING TOOL  Person answering questions: ____________self______ _____   1.  Is the patient or any family member in the home showing any signs or symptoms regarding respiratory infection?               Person with Symptom- __________NA_________________  a. Fever                                                                          Yes___ No___          ___________________  b. Shortness of breath                                                    Yes___ No___          ___________________ c. Cough/congestion                                       Yes___  No___         ___________________ d. Body aches/pains  Yes___ No___        ____________________ e. Gastrointestinal symptoms (diarrhea, nausea)           Yes___ No___        ____________________  2. Within the past 14 days, has anyone living in the home had any contact with someone with or under investigation for COVID-19?    Yes___ No_X_   Person __________________

## 2020-01-25 ENCOUNTER — Other Ambulatory Visit: Payer: Self-pay | Admitting: Adult Health

## 2020-01-25 DIAGNOSIS — M10222 Drug-induced gout, left elbow: Secondary | ICD-10-CM

## 2020-01-26 ENCOUNTER — Other Ambulatory Visit: Payer: Self-pay | Admitting: Adult Health

## 2020-01-26 DIAGNOSIS — G2581 Restless legs syndrome: Secondary | ICD-10-CM

## 2020-01-30 ENCOUNTER — Encounter: Payer: Self-pay | Admitting: *Deleted

## 2020-01-30 ENCOUNTER — Other Ambulatory Visit: Payer: Self-pay

## 2020-01-30 ENCOUNTER — Ambulatory Visit
Admission: RE | Admit: 2020-01-30 | Discharge: 2020-01-30 | Disposition: A | Discharge status: Home / Self Care | Payer: Medicare Other | Source: Ambulatory Visit | Attending: Interventional Radiology | Admitting: Interventional Radiology

## 2020-01-30 DIAGNOSIS — C220 Liver cell carcinoma: Secondary | ICD-10-CM | POA: Diagnosis not present

## 2020-01-30 DIAGNOSIS — Z9889 Other specified postprocedural states: Secondary | ICD-10-CM | POA: Diagnosis not present

## 2020-01-30 HISTORY — PX: IR RADIOLOGIST EVAL & MGMT: IMG5224

## 2020-01-30 NOTE — Progress Notes (Signed)
Referring Physician(s): McCullough,Heath  Chief Complaint: The patient is seen in follow up today s/p left lobe transarterial radioembolization on 10/02/19.  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  She presented with a 2.0 cm segment II and 1.7 cm segment III mass and underwent split dose radiation segmentectomy on 10/02/19.    She endorses a quick and easy recovery after the procedure.  She has recently developed some intermittent shaking which she attributes to her diabetes.  Additionally, she endorses some recent pain in her bilateral lateral chest which comes and goes.    No fevers, chills, nausea, vomiting, jaundice, change in stool, cough.    Past Medical History:  Diagnosis Date  . B12 deficiency 04/05/2017  . Blood in stool   . Cerebral aneurysm   . Chronic kidney disease   . Diabetes mellitus without complication ([place])   . Gastric erosion with bleeding   . Hyperlipidemia   . Hypertension   . Lower extremity cellulitis 02/08/2019  . Seizure disorder [facility])     Past Surgical History:  Procedure Laterality Date  . CEREBRAL ANEURYSM REPAIR  1991  . COLONOSCOPY WITH PROPOFOL N/A 02/12/2019   Procedure: COLONOSCOPY WITH PROPOFOL;  Surgeon: Lin Landsman, MD;  Location: [facility] ENDOSCOPY;  Service: Gastroenterology;  Laterality: N/A;  . ESOPHAGOGASTRODUODENOSCOPY (EGD) WITH PROPOFOL N/A 02/12/2019   Procedure: ESOPHAGOGASTRODUODENOSCOPY (EGD) WITH PROPOFOL;  Surgeon: Lin Landsman, MD;  Location[facility] ENDOSCOPY;  Service: Gastroenterology;  Laterality: N/A;  . IR ANGIOGRAM SELECTIVE EACH ADDITIONAL VESSEL  09/21/2019  . IR ANGIOGRAM SELECTIVE EACH ADDITIONAL VESSEL  09/21/2019  . IR ANGIOGRAM SELECTIVE EACH ADDITIONAL VESSEL  09/21/2019  . IR ANGIOGRAM SELECTIVE EACH ADDITIONAL VESSEL  09/21/2019  . IR ANGIOGRAM SELECTIVE EACH ADDITIONAL VESSEL   10/02/2019  . IR ANGIOGRAM VISCERAL SELECTIVE  09/21/2019  . IR ANGIOGRAM VISCERAL SELECTIVE  09/21/2019  . IR ANGIOGRAM VISCERAL SELECTIVE  10/02/2019  . IR EMBO TUMOR ORGAN ISCHEMIA INFARCT INC GUIDE ROADMAPPING  10/02/2019  . IR EMBO TUMOR ORGAN ISCHEMIA INFARCT INC GUIDE ROADMAPPING  10/02/2019  . IR RADIOLOGIST EVAL & MGMT  08/28/2019  . IR US GUIDE VASC ACCESS LEFT  09/21/2019  . IR US GUIDE VASC ACCESS LEFT  10/02/2019    Allergies: Aspirin  Medications: Prior to Admission medications   Medication Sig Start Date End Date Taking? Authorizing Provider  allopurinol (ZYLOPRIM) 100 MG tablet TAKE 1 TABLET BY MOUTH EVERY DAY 01/25/20   Kendell Bane, NP  apixaban (ELIQUIS) 5 MG TABS tablet Take 1 tablet (5 mg total) by mouth 2 (two) times daily. 06/12/19   Loel Dubonnet, NP  diltiazem (CARTIA XT) 240 MG 24 hr capsule Take 1 capsule (240 mg total) by mouth daily. 07/26/19 07/25/20  Vashti Hey, MD  escitalopram (LEXAPRO) 10 MG tablet TAKE 1 TABLET (10 MG TOTAL) BY MOUTH DAILY. FOR ANXIETY/SLEEP 12/10/19   Lavera Guise, MD  furosemide (LASIX) 20 MG tablet Take 1 tablet (20 mg total) by mouth daily. 09/21/19   Lavera Guise, MD  glimepiride (AMARYL) 1 MG tablet Take 1 tablet (1 mg total) by mouth every morning. 11/01/19 10/31/20  Kendell Bane, NP  metFORMIN (GLUCOPHAGE) 500 MG tablet Take 1 tablet (500 mg total) by mouth daily with breakfast. 11/01/19 10/31/20  Kendell Bane, NP  metoprolol tartrate (LOPRESSOR) 25 MG tablet TAKE 1/2 TABLET BY MOUTH TWICE A DAY 01/14/20  Marrianne Mood D, PA-C  mirtazapine (REMERON) 7.5 MG tablet TAKE 1 TABLET (7.5 MG TOTAL) BY MOUTH AT BEDTIME. 01/26/20   Lavera Guise, MD  spironolactone (ALDACTONE) 25 MG tablet Take 1 tablet (25 mg total) by mouth daily. For cirrhosis of liver 06/19/19   Kendell Bane, NP  traZODone (DESYREL) 50 MG tablet Take 0.5-1 tablets (25-50 mg total) by mouth at bedtime as needed for sleep. 09/05/19   Borders, Kirt Boys, NP       Family History  Problem Relation Age of Onset  . Heart attack Brother 52    Social History   Socioeconomic History  . Marital status: Divorced    Spouse name: Not on file  . Number of children: Not on file  . Years of education: Not on file  . Highest education level: Not on file  Occupational History  . Not on file  Tobacco Use  . Smoking status: Never Smoker  . Smokeless tobacco: Current User    Types: Snuff  Vaping Use  . Vaping Use: Never used  Substance and Sexual Activity  . Alcohol use: Not Currently    Alcohol/week: 14.0 standard drinks    Types: 14 Cans of beer per week    Comment: patient quit in october 2020  . Drug use: No  . Sexual activity: Not Currently  Other Topics Concern  . Not on file  Social History Narrative  . Not on file   Social Determinants of Health   Financial Resource Strain:   . Difficulty of Paying Living Expenses: Not on file  Food Insecurity:   . Worried About Charity fundraiser in the Last Year: Not on file  . Ran Out of Food in the Last Year: Not on file  Transportation Needs:   . Lack of Transportation (Medical): Not on file  . Lack of Transportation (Non-Medical): Not on file  Physical Activity:   . Days of Exercise per Week: Not on file  . Minutes of Exercise per Session: Not on file  Stress:   . Feeling of Stress : Not on file  Social Connections:   . Frequency of Communication with Friends and Family: Not on file  . Frequency of Social Gatherings with Friends and Family: Not on file  . Attends Religious Services: Not on file  . Active Member of Clubs or Organizations: Not on file  . Attends Archivist Meetings: Not on file  . Marital Status: Not on file     Vital Signs: There were no vitals taken for this visit.   Imaging: MRI abdomen 01/03/20:   Segment II, TR non-viable   Segment III, TR equivocal  Labs:  CBC: Recent Labs    09/21/19 0900 10/02/19 0840 11/22/19 1308 01/03/20 1015   WBC 7.2 6.1 6.0 5.3  HGB 12.3 12.0 12.8 13.2  HCT 37.1 37.5 38.8 39.1  PLT 176 151 139* 139*    COAGS: Recent Labs    02/08/19 1551 09/21/19 0900 10/02/19 0840  INR 2.6* 1.1 1.2    BMP: Recent Labs    09/21/19 0900 10/02/19 0840 11/22/19 1308 01/03/20 1015  NA 139 141 137 141  K 4.0 3.8 3.4* 4.3  CL 102 103 99 98  CO2 25 27 26  33*  GLUCOSE 126* 147* 195* 298*  BUN 25* 20 24* 17  CALCIUM 9.5 9.4 9.1 9.6  CREATININE 1.00 1.19* 1.26* 1.27*  GFRNONAA 59* 48* 44* 44*  GFRAA >60 55* 51* 51*  LIVER FUNCTION TESTS: Recent Labs    07/26/19 0807 07/26/19 0807 09/21/19 0900 10/02/19 0840 11/22/19 1308 01/03/20 1015  BILITOT 1.3*   < > 1.0 0.6 0.6 1.0  AST 21  --   --  23 24 22   ALT 14  --   --  15 13 15   ALKPHOS 75  --   --  89 102 96  PROT 7.6  --   --  8.7* 8.7* 8.9*  ALBUMIN 2.8*  --   --  3.6 3.7 3.6   < > = values in this interval not displayed.    Assessment and Plan: 64 year old female with history of alcoholic cirrhosis complicated by multifocal unilobar hepatocellular carcinoma.  She presented with a 2.0 cm segment II and 1.7 cm segment III mass and underwent split dose radiation segmentectomy on 10/02/19.  She is recovering well.  MRI reassuring for good response.  Plan for MRI abdomen in mid December with associated follow up clinic visit.  Electronically Signed: Suzette Battiest 01/30/2020, 1:58 PM   I spent a total of 15 Minutes in telephone clinical consultation, greater than 50% of which was counseling/coordinating care for hepatocellular carcinoma.

## 2020-01-31 ENCOUNTER — Ambulatory Visit (INDEPENDENT_AMBULATORY_CARE_PROVIDER_SITE_OTHER): Payer: Medicare Other | Admitting: Hospice and Palliative Medicine

## 2020-01-31 ENCOUNTER — Encounter: Payer: Self-pay | Admitting: Hospice and Palliative Medicine

## 2020-01-31 ENCOUNTER — Other Ambulatory Visit: Payer: Self-pay

## 2020-01-31 DIAGNOSIS — K7031 Alcoholic cirrhosis of liver with ascites: Secondary | ICD-10-CM | POA: Diagnosis not present

## 2020-01-31 DIAGNOSIS — E1165 Type 2 diabetes mellitus with hyperglycemia: Secondary | ICD-10-CM

## 2020-01-31 DIAGNOSIS — I10 Essential (primary) hypertension: Secondary | ICD-10-CM

## 2020-01-31 DIAGNOSIS — E119 Type 2 diabetes mellitus without complications: Secondary | ICD-10-CM

## 2020-01-31 LAB — POCT GLYCOSYLATED HEMOGLOBIN (HGB A1C): Hemoglobin A1C: 11.7 % — AB (ref 4.0–5.6)

## 2020-01-31 LAB — GLUCOSE, POCT (MANUAL RESULT ENTRY): POC Glucose: 246 mg/dl — AB (ref 70–99)

## 2020-01-31 NOTE — Progress Notes (Signed)
[facility] [place], [place] [address]  Internal MEDICINE  Office Visit Note  Patient Name: Adriana Miller  [id number]  [id number]  Date of Service: 02/01/2020  Chief Complaint  Patient presents with  . Follow-up    3 month fup  . controlled substance policy    acknowledged  . Quality Metric Gaps    Tdap, Dexa scan, Eye exam (appt for Jan 2022), urinr microalbumin    HPI Patient is here for routine follow-up She has noticed lately that she has been shaking in her arms and hands lately--we checked her glucose level in office >200 She has been adherent with her metformin as well as glimepiride--she does say that she has been eating bologna sandwiches every day, she knows she should not be having that much bread  She lives alone--little support from family, she does have a CNA that comes into her home M-F for about 2 hours a day, does not currently help her with her medication but is there to help with lite house work She has been having difficulty with her glucometer and has not been able to monitor her glucose levels at home lately  History of liver cirrhosis secondary to ETOH--followed closely by GI, she remains adamant that she has not been drinking in over a year  Current Medication: Outpatient Encounter Medications as of 01/31/2020  Medication Sig  . allopurinol (ZYLOPRIM) 100 MG tablet TAKE 1 TABLET BY MOUTH EVERY DAY  . apixaban (ELIQUIS) 5 MG TABS tablet Take 1 tablet (5 mg total) by mouth 2 (two) times daily.  Marland Kitchen escitalopram (LEXAPRO) 10 MG tablet TAKE 1 TABLET (10 MG TOTAL) BY MOUTH DAILY. FOR ANXIETY/SLEEP  . furosemide (LASIX) 20 MG tablet Take 1 tablet (20 mg total) by mouth daily.  . metoprolol tartrate (LOPRESSOR) 25 MG tablet TAKE 1/2 TABLET BY MOUTH TWICE A DAY  . spironolactone (ALDACTONE) 25 MG tablet Take 1 tablet (25 mg total) by mouth daily. For cirrhosis of liver  . traZODone (DESYREL) 50 MG tablet Take 0.5-1 tablets (25-50 mg  total) by mouth at bedtime as needed for sleep.  . [DISCONTINUED] diltiazem (CARTIA XT) 240 MG 24 hr capsule Take 1 capsule (240 mg total) by mouth daily.  . [DISCONTINUED] glimepiride (AMARYL) 1 MG tablet Take 1 tablet (1 mg total) by mouth every morning.  . [DISCONTINUED] metFORMIN (GLUCOPHAGE) 500 MG tablet Take 1 tablet (500 mg total) by mouth daily with breakfast.  . [DISCONTINUED] mirtazapine (REMERON) 7.5 MG tablet TAKE 1 TABLET (7.5 MG TOTAL) BY MOUTH AT BEDTIME.   No facility-administered encounter medications on file as of 01/31/2020.    Surgical History: Past Surgical History:  Procedure Laterality Date  . CEREBRAL ANEURYSM REPAIR  1991  . COLONOSCOPY WITH PROPOFOL N/A 02/12/2019   Procedure: COLONOSCOPY WITH PROPOFOL;  Surgeon: Lin Landsman, MD;  Location[facility] ENDOSCOPY;  Service: Gastroenterology;  Laterality: N/A;  . ESOPHAGOGASTRODUODENOSCOPY (EGD) WITH PROPOFOL N/A 02/12/2019   Procedure: ESOPHAGOGASTRODUODENOSCOPY (EGD) WITH PROPOFOL;  Surgeon: Lin Landsman, MD;  Location: [facility] ENDOSCOPY;  Service: Gastroenterology;  Laterality: N/A;  . IR ANGIOGRAM SELECTIVE EACH ADDITIONAL VESSEL  09/21/2019  . IR ANGIOGRAM SELECTIVE EACH ADDITIONAL VESSEL  09/21/2019  . IR ANGIOGRAM SELECTIVE EACH ADDITIONAL VESSEL  09/21/2019  . IR ANGIOGRAM SELECTIVE EACH ADDITIONAL VESSEL  09/21/2019  . IR ANGIOGRAM SELECTIVE EACH ADDITIONAL VESSEL  10/02/2019  . IR ANGIOGRAM VISCERAL SELECTIVE  09/21/2019  . IR ANGIOGRAM VISCERAL SELECTIVE  09/21/2019  . IR ANGIOGRAM VISCERAL  SELECTIVE  10/02/2019  . IR EMBO TUMOR ORGAN ISCHEMIA INFARCT INC GUIDE ROADMAPPING  10/02/2019  . IR EMBO TUMOR ORGAN ISCHEMIA INFARCT INC GUIDE ROADMAPPING  10/02/2019  . IR RADIOLOGIST EVAL & MGMT  08/28/2019  . IR RADIOLOGIST EVAL & MGMT  01/30/2020  . IR US GUIDE VASC ACCESS LEFT  09/21/2019  . IR US GUIDE VASC ACCESS LEFT  10/02/2019    Medical History: Past Medical History:  Diagnosis Date  . B12 deficiency  04/05/2017  . Blood in stool   . Cerebral aneurysm   . Chronic kidney disease   . Diabetes mellitus without complication ([place])   . Gastric erosion with bleeding   . Hyperlipidemia   . Hypertension   . Lower extremity cellulitis 02/08/2019  . Seizure disorder [facility])     Family History: Family History  Problem Relation Age of Onset  . Heart attack Brother 46    Social History   Socioeconomic History  . Marital status: Divorced    Spouse name: Not on file  . Number of children: Not on file  . Years of education: Not on file  . Highest education level: Not on file  Occupational History  . Not on file  Tobacco Use  . Smoking status: Never Smoker  . Smokeless tobacco: Current User    Types: Snuff  Vaping Use  . Vaping Use: Never used  Substance and Sexual Activity  . Alcohol use: Not Currently    Alcohol/week: 14.0 standard drinks    Types: 14 Cans of beer per week    Comment: patient quit in october 2020  . Drug use: No  . Sexual activity: Not Currently  Other Topics Concern  . Not on file  Social History Narrative  . Not on file   Social Determinants of Health   Financial Resource Strain:   . Difficulty of Paying Living Expenses: Not on file  Food Insecurity:   . Worried About Charity fundraiser in the Last Year: Not on file  . Ran Out of Food in the Last Year: Not on file  Transportation Needs:   . Lack of Transportation (Medical): Not on file  . Lack of Transportation (Non-Medical): Not on file  Physical Activity:   . Days of Exercise per Week: Not on file  . Minutes of Exercise per Session: Not on file  Stress:   . Feeling of Stress : Not on file  Social Connections:   . Frequency of Communication with Friends and Family: Not on file  . Frequency of Social Gatherings with Friends and Family: Not on file  . Attends Religious Services: Not on file  . Active Member of Clubs or Organizations: Not on file  . Attends Archivist Meetings: Not on  file  . Marital Status: Not on file  Intimate Partner Violence:   . Fear of Current or Ex-Partner: Not on file  . Emotionally Abused: Not on file  . Physically Abused: Not on file  . Sexually Abused: Not on file   Review of Systems  Constitutional: Negative for chills, diaphoresis and fatigue.  HENT: Negative for ear pain, postnasal drip and sinus pressure.   Eyes: Negative for photophobia, discharge, redness, itching and visual disturbance.  Respiratory: Negative for cough, shortness of breath and wheezing.   Cardiovascular: Negative for chest pain, palpitations and leg swelling.  Gastrointestinal: Negative for abdominal pain, constipation, diarrhea, nausea and vomiting.  Endocrine: Negative for polydipsia, polyphagia and polyuria.  Genitourinary: Negative for dysuria and  flank pain.  Musculoskeletal: Negative for arthralgias, back pain, gait problem and neck pain.  Skin: Negative for color change.  Allergic/Immunologic: Negative for environmental allergies and food allergies.  Neurological: Positive for tremors. Negative for dizziness and headaches.  Hematological: Does not bruise/bleed easily.  Psychiatric/Behavioral: Negative for agitation, behavioral problems (depression) and hallucinations.    Vital Signs: BP 132/74   Pulse 67   Resp 16   Ht 5\' 5"  (1.651 m)   Wt 170 lb 12.8 oz (77.5 kg)   SpO2 99%   BMI 28.42 kg/m    Physical Exam Vitals reviewed.  Constitutional:      Appearance: Normal appearance.  Cardiovascular:     Rate and Rhythm: Normal rate and regular rhythm.     Pulses: Normal pulses.     Heart sounds: Normal heart sounds.  Pulmonary:     Effort: Pulmonary effort is normal.     Breath sounds: Normal breath sounds.  Skin:    General: Skin is warm.  Neurological:     General: No focal deficit present.     Mental Status: She is alert and oriented to person, place, and time. Mental status is at baseline.  Psychiatric:        Mood and Affect: Mood  normal.        Behavior: Behavior normal.        Thought Content: Thought content normal.   Assessment/Plan: 1. Uncontrolled type 2 diabetes mellitus with hyperglycemia (HCC) A1C 11.7 today, uncontrolled At this time stop metformin as well as glimepiride, start Toujeo Due to hyperglycemia in office today--demonstrated how to inject Toujeo into abdominal adipose tissue--had her return demonstrate injecting 4 units Advised to inject 10 units of Toujeo into skin--sent home with sample and needles Also reordered supplies for her old glucometer for her to monitor glucose levels at home Advised to check glucose daily and to write recording down along with date and time of day taken  Will closely monitor glucose levels and adjust Toujeo until stabilized - POCT glycosylated hemoglobin (Hb A1C) - POCT Glucose (CBG)  2. Alcoholic cirrhosis of liver with ascites ([place]) Remains stable at this time--continue with GI follow-up  3. Essential hypertension BP and HR stable today--continue with current therapy and routine monitoring  General Counseling: Joelee verbalizes understanding of the findings of todays visit and agrees with plan of treatment. I have discussed any further diagnostic evaluation that may be needed or ordered today. We also reviewed her medications today. she has been encouraged to call the office with any questions or concerns that should arise related to todays visit.    Orders Placed This Encounter  Procedures  . POCT glycosylated hemoglobin (Hb A1C)  . POCT Glucose (CBG)      Time spent: 30 Minutes Time spent includes review of chart, medications, test results and follow-up plan with the patient.  This patient was seen by Theodoro Grist AGNP-C in Collaboration with Dr Lavera Guise as a part of collaborative care agreement     Tanna Furry. Madysyn Hanken AGNP-C Internal medicine

## 2020-02-01 ENCOUNTER — Encounter: Payer: Self-pay | Admitting: Hospice and Palliative Medicine

## 2020-02-04 ENCOUNTER — Telehealth: Payer: Self-pay

## 2020-02-04 ENCOUNTER — Other Ambulatory Visit: Payer: Self-pay

## 2020-02-04 MED ORDER — ACCU-CHEK GUIDE VI STRP: ORAL_STRIP | 1 refills | Status: DC

## 2020-02-04 MED ORDER — SAFETY LANCETS 28G MISC: 1 refills | Status: DC

## 2020-02-04 MED ORDER — INSULIN PEN NEEDLE 32G X 4 MM MISC: 0 refills | Status: DC

## 2020-02-04 NOTE — Telephone Encounter (Signed)
Pt was given Toujeo sample at visit 10 units daily

## 2020-02-06 ENCOUNTER — Other Ambulatory Visit: Payer: Self-pay | Admitting: Internal Medicine

## 2020-02-06 DIAGNOSIS — I4891 Unspecified atrial fibrillation: Secondary | ICD-10-CM

## 2020-02-07 ENCOUNTER — Telehealth: Payer: Self-pay

## 2020-02-07 NOTE — Telephone Encounter (Signed)
Left pt VM to call back so we can see how she is doing with the insulin and what blood sugars have been running.

## 2020-02-14 ENCOUNTER — Ambulatory Visit (INDEPENDENT_AMBULATORY_CARE_PROVIDER_SITE_OTHER): Payer: Medicare Other | Admitting: Nurse Practitioner

## 2020-02-14 ENCOUNTER — Other Ambulatory Visit: Payer: Self-pay

## 2020-02-14 ENCOUNTER — Encounter: Payer: Self-pay | Admitting: Nurse Practitioner

## 2020-02-14 VITALS — BP 162/98 | HR 63 | Temp 97.1°F | Resp 16 | Ht 65.0 in | Wt 169.0 lb

## 2020-02-14 DIAGNOSIS — E1129 Type 2 diabetes mellitus with other diabetic kidney complication: Secondary | ICD-10-CM

## 2020-02-14 DIAGNOSIS — E1165 Type 2 diabetes mellitus with hyperglycemia: Secondary | ICD-10-CM | POA: Diagnosis not present

## 2020-02-14 DIAGNOSIS — I10 Essential (primary) hypertension: Secondary | ICD-10-CM | POA: Diagnosis not present

## 2020-02-14 DIAGNOSIS — E538 Deficiency of other specified B group vitamins: Secondary | ICD-10-CM

## 2020-02-14 DIAGNOSIS — IMO0002 Reserved for concepts with insufficient information to code with codable children: Secondary | ICD-10-CM

## 2020-02-14 MED ORDER — CYANOCOBALAMIN 1000 MCG/ML IJ SOLN
1000.0000 ug | Freq: Once | INTRAMUSCULAR | Status: AC
  Administered 2020-02-14: 1000 ug via INTRAMUSCULAR

## 2020-02-14 NOTE — Progress Notes (Signed)
[facility] [place], [place] [address]  Internal MEDICINE  Office Visit Note  Patient Name: Adriana Miller  [id number]  [id number]  Date of Service: 03/08/2020  Chief Complaint  Patient presents with  . Follow-up  . Diabetes  . Hyperlipidemia  . Hypertension  . Quality Metric Gaps    tetnaus    The patient is here for follow up visit. She was started on Toujeo 10 units every day due to uncontrolled type 2 diabetes. She states that she has been taking the medication as prescribed. She states that she has been unable to check her blood sugars. She could not remember how to use to glucose monitor. Repeat demonstration was done by [facility], CMA. Patient's blood sugar was 510 when tested in the office. The patient stated that she understood how to check her blood sugars now. She was able to repeat demonstration back to [place]. Will increase toujeo to 15 units every day. Patient was able to repeat directions back to me. She states that she takes her insulin at night when she eats dinner. She has not taken her insulin yet today. She states that she did eat some cheese puffs just prior to arriving at the office.       Current Medication: Outpatient Encounter Medications as of 02/14/2020  Medication Sig  . allopurinol (ZYLOPRIM) 100 MG tablet TAKE 1 TABLET BY MOUTH EVERY DAY  . ELIQUIS 2.5 MG TABS tablet TAKE 1 TABLET BY MOUTH TWICE A DAY  . escitalopram (LEXAPRO) 10 MG tablet TAKE 1 TABLET (10 MG TOTAL) BY MOUTH DAILY. FOR ANXIETY/SLEEP  . furosemide (LASIX) 20 MG tablet TAKE 1 TABLET BY MOUTH EVERY DAY  . glucose blood (ACCU-CHEK GUIDE) test strip Use as instructed once a daily DX e11.65  . metoprolol tartrate (LOPRESSOR) 25 MG tablet TAKE 1/2 TABLET BY MOUTH TWICE A DAY  . Safety Lancets 28G MISC Use as directed once a daily Dx e11.65  . spironolactone (ALDACTONE) 25 MG tablet Take 1 tablet (25 mg total) by mouth daily. For cirrhosis of liver  . traZODone (DESYREL)  50 MG tablet Take 0.5-1 tablets (25-50 mg total) by mouth at bedtime as needed for sleep.  . [DISCONTINUED] apixaban (ELIQUIS) 5 MG TABS tablet Take 1 tablet (5 mg total) by mouth 2 (two) times daily.  . [DISCONTINUED] Insulin Pen Needle 32G X 4 MM MISC Use as directed  With toujeo  . [EXPIRED] cyanocobalamin ((VITAMIN B-12)) injection 1,000 mcg    No facility-administered encounter medications on file as of 02/14/2020.    Surgical History: Past Surgical History:  Procedure Laterality Date  . CEREBRAL ANEURYSM REPAIR  1991  . COLONOSCOPY WITH PROPOFOL N/A 02/12/2019   Procedure: COLONOSCOPY WITH PROPOFOL;  Surgeon: Lin Landsman, MD;  Location: [facility] ENDOSCOPY;  Service: Gastroenterology;  Laterality: N/A;  . ESOPHAGOGASTRODUODENOSCOPY (EGD) WITH PROPOFOL N/A 02/12/2019   Procedure: ESOPHAGOGASTRODUODENOSCOPY (EGD) WITH PROPOFOL;  Surgeon: Lin Landsman, MD;  Location: [facility] ENDOSCOPY;  Service: Gastroenterology;  Laterality: N/A;  . IR ANGIOGRAM SELECTIVE EACH ADDITIONAL VESSEL  09/21/2019  . IR ANGIOGRAM SELECTIVE EACH ADDITIONAL VESSEL  09/21/2019  . IR ANGIOGRAM SELECTIVE EACH ADDITIONAL VESSEL  09/21/2019  . IR ANGIOGRAM SELECTIVE EACH ADDITIONAL VESSEL  09/21/2019  . IR ANGIOGRAM SELECTIVE EACH ADDITIONAL VESSEL  10/02/2019  . IR ANGIOGRAM VISCERAL SELECTIVE  09/21/2019  . IR ANGIOGRAM VISCERAL SELECTIVE  09/21/2019  . IR ANGIOGRAM VISCERAL SELECTIVE  10/02/2019  . IR EMBO TUMOR ORGAN ISCHEMIA INFARCT INC GUIDE  ROADMAPPING  10/02/2019  . IR EMBO TUMOR ORGAN ISCHEMIA INFARCT INC GUIDE ROADMAPPING  10/02/2019  . IR RADIOLOGIST EVAL & MGMT  08/28/2019  . IR RADIOLOGIST EVAL & MGMT  01/30/2020  . IR US GUIDE VASC ACCESS LEFT  09/21/2019  . IR US GUIDE VASC ACCESS LEFT  10/02/2019    Medical History: Past Medical History:  Diagnosis Date  . B12 deficiency 04/05/2017  . Blood in stool   . Cerebral aneurysm   . Chronic kidney disease   . Diabetes mellitus without complication ([place])   .  Gastric erosion with bleeding   . Hyperlipidemia   . Hypertension   . Lower extremity cellulitis 02/08/2019  . Seizure disorder [facility])     Family History: Family History  Problem Relation Age of Onset  . Heart attack Brother 33    Social History   Socioeconomic History  . Marital status: Divorced    Spouse name: Not on file  . Number of children: Not on file  . Years of education: Not on file  . Highest education level: Not on file  Occupational History  . Not on file  Tobacco Use  . Smoking status: Never Smoker  . Smokeless tobacco: Current User    Types: Snuff  Vaping Use  . Vaping Use: Never used  Substance and Sexual Activity  . Alcohol use: Not Currently    Alcohol/week: 14.0 standard drinks    Types: 14 Cans of beer per week    Comment: patient quit in october 2020  . Drug use: No  . Sexual activity: Not Currently  Other Topics Concern  . Not on file  Social History Narrative  . Not on file   Social Determinants of Health   Financial Resource Strain:   . Difficulty of Paying Living Expenses: Not on file  Food Insecurity:   . Worried About Charity fundraiser in the Last Year: Not on file  . Ran Out of Food in the Last Year: Not on file  Transportation Needs:   . Lack of Transportation (Medical): Not on file  . Lack of Transportation (Non-Medical): Not on file  Physical Activity:   . Days of Exercise per Week: Not on file  . Minutes of Exercise per Session: Not on file  Stress:   . Feeling of Stress : Not on file  Social Connections:   . Frequency of Communication with Friends and Family: Not on file  . Frequency of Social Gatherings with Friends and Family: Not on file  . Attends Religious Services: Not on file  . Active Member of Clubs or Organizations: Not on file  . Attends Archivist Meetings: Not on file  . Marital Status: Not on file  Intimate Partner Violence:   . Fear of Current or Ex-Partner: Not on file  . Emotionally Abused:  Not on file  . Physically Abused: Not on file  . Sexually Abused: Not on file      Review of Systems  Constitutional: Negative for activity change, chills, fatigue and unexpected weight change.  HENT: Negative for congestion, postnasal drip, rhinorrhea, sneezing and sore throat.   Respiratory: Negative for cough, chest tightness and shortness of breath.   Cardiovascular: Negative for chest pain and palpitations.  Gastrointestinal: Negative for abdominal pain, constipation, diarrhea, nausea and vomiting.  Endocrine: Negative for cold intolerance, heat intolerance, polydipsia and polyuria.       High blood sugar. Patient unsure of how to use home glucose meter.  Musculoskeletal: Negative for arthralgias, back pain, joint swelling and neck pain.  Skin: Negative for rash.  Allergic/Immunologic: Negative for environmental allergies.  Neurological: Negative for dizziness, tremors, numbness and headaches.  Hematological: Negative for adenopathy. Does not bruise/bleed easily.  Psychiatric/Behavioral: Negative for behavioral problems (Depression), sleep disturbance and suicidal ideas. The patient is not nervous/anxious.     Today's Vitals   02/14/20 1543  BP: (!) 162/98  Pulse: 63  Resp: 16  Temp: (!) 97.1 F (36.2 C)  SpO2: 94%  Weight: 169 lb (76.7 kg)  Height: 5\' 5"  (1.651 m)   Body mass index is 28.12 kg/m.  Physical Exam Vitals and nursing note reviewed.  Constitutional:      General: She is not in acute distress.    Appearance: Normal appearance. She is well-developed. She is not diaphoretic.  HENT:     Head: Normocephalic and atraumatic.     Mouth/Throat:     Pharynx: No oropharyngeal exudate.  Eyes:     Pupils: Pupils are equal, round, and reactive to light.  Neck:     Thyroid: No thyromegaly.     Vascular: No JVD.     Trachea: No tracheal deviation.  Cardiovascular:     Rate and Rhythm: Normal rate and regular rhythm.     Heart sounds: Normal heart sounds. No  murmur heard.  No friction rub. No gallop.   Pulmonary:     Effort: Pulmonary effort is normal. No respiratory distress.     Breath sounds: Normal breath sounds. No wheezing or rales.  Chest:     Chest wall: No tenderness.  Abdominal:     Palpations: Abdomen is soft.  Musculoskeletal:        General: Normal range of motion.     Cervical back: Normal range of motion and neck supple.  Lymphadenopathy:     Cervical: No cervical adenopathy.  Skin:    General: Skin is warm and dry.  Neurological:     Mental Status: She is alert and oriented to person, place, and time. Mental status is at baseline.     Cranial Nerves: No cranial nerve deficit.  Psychiatric:        Mood and Affect: Mood normal.        Behavior: Behavior normal.        Thought Content: Thought content normal.        Judgment: Judgment normal.    Assessment/Plan: 1. Uncontrolled type 2 diabetes mellitus with hyperglycemia (HCC) - POCT Glucose (CBG) 510 today. Increased toujeo to 15 units daily. Demonstrated proper use of glucose meter. Patient able to repeat demonstration back to CMA. She voiced understanding and increased comfort with check blood sugars at home. Will see back in one week to recheck blood sugar.   2. B12 deficiency Vitamin b12 injection administered in the office today.  - cyanocobalamin ((VITAMIN B-12)) injection 1,000 mcg  3. Essential hypertension Generally stable. Continue bp medication as prescribed   General Counseling: stephaney steven understanding of the findings of todays visit and agrees with plan of treatment. I have discussed any further diagnostic evaluation that may be needed or ordered today. We also reviewed her medications today. she has been encouraged to call the office with any questions or concerns that should arise related to todays visit.  Diabetes Counseling:  1. Addition of ACE inh/ ARB'S for nephroprotection. Microalbumin is updated  2. Diabetic foot care, prevention of  complications. Podiatry consult 3. Exercise and lose weight.  4. Diabetic eye examination,  Diabetic eye exam is updated  5. Monitor blood sugar closlely. nutrition counseling.  6. Sign and symptoms of hypoglycemia including shaking sweating,confusion and headaches.  This patient was seen by Leretha Pol FNP Collaboration with Dr Lavera Guise as a part of collaborative care agreement  Orders Placed This Encounter  Procedures  . POCT Glucose (CBG)    Meds ordered this encounter  Medications  . cyanocobalamin ((VITAMIN B-12)) injection 1,000 mcg    Total time spent: 30 Minutes   Time spent includes review of chart, medications, test results, and follow up plan with the patient.      Dr Lavera Guise Internal medicine

## 2020-02-22 ENCOUNTER — Ambulatory Visit (INDEPENDENT_AMBULATORY_CARE_PROVIDER_SITE_OTHER): Payer: Medicare Other | Admitting: Nurse Practitioner

## 2020-02-22 ENCOUNTER — Other Ambulatory Visit: Payer: Self-pay

## 2020-02-22 ENCOUNTER — Encounter: Payer: Self-pay | Admitting: Nurse Practitioner

## 2020-02-22 VITALS — BP 136/66 | HR 60 | Temp 97.3°F | Resp 16 | Ht 66.0 in | Wt 167.6 lb

## 2020-02-22 DIAGNOSIS — I10 Essential (primary) hypertension: Secondary | ICD-10-CM

## 2020-02-22 DIAGNOSIS — E1165 Type 2 diabetes mellitus with hyperglycemia: Secondary | ICD-10-CM

## 2020-02-22 LAB — GLUCOSE, POCT (MANUAL RESULT ENTRY): POC Glucose: 345 mg/dl — AB (ref 70–99)

## 2020-02-22 MED ORDER — TOUJEO SOLOSTAR 300 UNIT/ML ~~LOC~~ SOPN: 20.0000 [IU] | PEN_INJECTOR | Freq: Every day | SUBCUTANEOUS | 1 refills | Status: DC

## 2020-02-22 NOTE — Progress Notes (Signed)
[facility] [place], [place] [address]  Internal MEDICINE  Office Visit Note  Patient Name: Adriana Miller  [id number]  [id number]  Date of Service: 03/15/2020  Chief Complaint  Patient presents with  . Follow-up    sugar levels, pt wants b12,BP  . Diabetes  . Hyperlipidemia  . Hypertension  . policy update form    received  . Quality Metric Gaps    dexa, PNA, eye exam    Patient here for follow up visit. incrased toujeo to 15 units daily at most recent visit. Has been checking her blood sugars every day. They are as follows:  10/29 429 10/30 - 419 10/31 - 474 11/1 - 107 11/2 - 512 11/3 - 371  11/4 - 425 11/5 - 345  The patient has tolerated the increased dose of basal insulin well with no negative side effects.       Current Medication: Outpatient Encounter Medications as of 02/22/2020  Medication Sig  . allopurinol (ZYLOPRIM) 100 MG tablet TAKE 1 TABLET BY MOUTH EVERY DAY  . ELIQUIS 2.5 MG TABS tablet TAKE 1 TABLET BY MOUTH TWICE A DAY  . escitalopram (LEXAPRO) 10 MG tablet TAKE 1 TABLET (10 MG TOTAL) BY MOUTH DAILY. FOR ANXIETY/SLEEP  . furosemide (LASIX) 20 MG tablet TAKE 1 TABLET BY MOUTH EVERY DAY  . glucose blood (ACCU-CHEK GUIDE) test strip Use as instructed once a daily DX e11.65  . metoprolol tartrate (LOPRESSOR) 25 MG tablet TAKE 1/2 TABLET BY MOUTH TWICE A DAY  . Safety Lancets 28G MISC Use as directed once a daily Dx e11.65  . spironolactone (ALDACTONE) 25 MG tablet Take 1 tablet (25 mg total) by mouth daily. For cirrhosis of liver  . traZODone (DESYREL) 50 MG tablet Take 0.5-1 tablets (25-50 mg total) by mouth at bedtime as needed for sleep.  . [DISCONTINUED] Insulin Pen Needle 32G X 4 MM MISC Use as directed  With toujeo  . [DISCONTINUED] apixaban (ELIQUIS) 5 MG TABS tablet Take 1 tablet (5 mg total) by mouth 2 (two) times daily.  . [DISCONTINUED] insulin glargine, 1 Unit Dial, (TOUJEO SOLOSTAR) 300 UNIT/ML Solostar Pen  Inject 20 Units into the skin daily.   No facility-administered encounter medications on file as of 02/22/2020.    Surgical History: Past Surgical History:  Procedure Laterality Date  . CEREBRAL ANEURYSM REPAIR  1991  . COLONOSCOPY WITH PROPOFOL N/A 02/12/2019   Procedure: COLONOSCOPY WITH PROPOFOL;  Surgeon: Lin Landsman, MD;  Location: [facility] ENDOSCOPY;  Service: Gastroenterology;  Laterality: N/A;  . ESOPHAGOGASTRODUODENOSCOPY (EGD) WITH PROPOFOL N/A 02/12/2019   Procedure: ESOPHAGOGASTRODUODENOSCOPY (EGD) WITH PROPOFOL;  Surgeon: Lin Landsman, MD;  Location: [facility] ENDOSCOPY;  Service: Gastroenterology;  Laterality: N/A;  . IR ANGIOGRAM SELECTIVE EACH ADDITIONAL VESSEL  09/21/2019  . IR ANGIOGRAM SELECTIVE EACH ADDITIONAL VESSEL  09/21/2019  . IR ANGIOGRAM SELECTIVE EACH ADDITIONAL VESSEL  09/21/2019  . IR ANGIOGRAM SELECTIVE EACH ADDITIONAL VESSEL  09/21/2019  . IR ANGIOGRAM SELECTIVE EACH ADDITIONAL VESSEL  10/02/2019  . IR ANGIOGRAM VISCERAL SELECTIVE  09/21/2019  . IR ANGIOGRAM VISCERAL SELECTIVE  09/21/2019  . IR ANGIOGRAM VISCERAL SELECTIVE  10/02/2019  . IR EMBO TUMOR ORGAN ISCHEMIA INFARCT INC GUIDE ROADMAPPING  10/02/2019  . IR EMBO TUMOR ORGAN ISCHEMIA INFARCT INC GUIDE ROADMAPPING  10/02/2019  . IR RADIOLOGIST EVAL & MGMT  08/28/2019  . IR RADIOLOGIST EVAL & MGMT  01/30/2020  . IR US GUIDE VASC ACCESS LEFT  09/21/2019  . IR US  GUIDE VASC ACCESS LEFT  10/02/2019    Medical History: Past Medical History:  Diagnosis Date  . B12 deficiency 04/05/2017  . Blood in stool   . Cerebral aneurysm   . Chronic kidney disease   . Diabetes mellitus without complication ([place])   . Gastric erosion with bleeding   . Hyperlipidemia   . Hypertension   . Lower extremity cellulitis 02/08/2019  . Seizure disorder [facility])     Family History: Family History  Problem Relation Age of Onset  . Heart attack Brother 13    Social History   Socioeconomic History  . Marital status: Divorced     Spouse name: Not on file  . Number of children: Not on file  . Years of education: Not on file  . Highest education level: Not on file  Occupational History  . Not on file  Tobacco Use  . Smoking status: Never Smoker  . Smokeless tobacco: Current User    Types: Snuff  Vaping Use  . Vaping Use: Never used  Substance and Sexual Activity  . Alcohol use: Not Currently    Alcohol/week: 14.0 standard drinks    Types: 14 Cans of beer per week    Comment: patient quit in october 2020  . Drug use: No  . Sexual activity: Not Currently  Other Topics Concern  . Not on file  Social History Narrative  . Not on file   Social Determinants of Health   Financial Resource Strain:   . Difficulty of Paying Living Expenses: Not on file  Food Insecurity:   . Worried About Charity fundraiser in the Last Year: Not on file  . Ran Out of Food in the Last Year: Not on file  Transportation Needs:   . Lack of Transportation (Medical): Not on file  . Lack of Transportation (Non-Medical): Not on file  Physical Activity:   . Days of Exercise per Week: Not on file  . Minutes of Exercise per Session: Not on file  Stress:   . Feeling of Stress : Not on file  Social Connections:   . Frequency of Communication with Friends and Family: Not on file  . Frequency of Social Gatherings with Friends and Family: Not on file  . Attends Religious Services: Not on file  . Active Member of Clubs or Organizations: Not on file  . Attends Archivist Meetings: Not on file  . Marital Status: Not on file  Intimate Partner Violence:   . Fear of Current or Ex-Partner: Not on file  . Emotionally Abused: Not on file  . Physically Abused: Not on file  . Sexually Abused: Not on file      Review of Systems  Constitutional: Negative for activity change, chills, fatigue and unexpected weight change.  HENT: Negative for congestion, postnasal drip, rhinorrhea, sneezing and sore throat.   Respiratory: Negative for  cough, chest tightness, shortness of breath and wheezing.   Cardiovascular: Negative for chest pain and palpitations.  Gastrointestinal: Negative for abdominal pain, constipation, diarrhea, nausea and vomiting.  Endocrine: Negative for cold intolerance, heat intolerance, polydipsia and polyuria.       Blood sugars mildly improved.   Musculoskeletal: Negative for arthralgias, back pain, joint swelling and neck pain.  Skin: Negative for rash.  Allergic/Immunologic: Negative for environmental allergies.  Neurological: Negative for dizziness, tremors, numbness and headaches.  Hematological: Negative for adenopathy. Does not bruise/bleed easily.  Psychiatric/Behavioral: Negative for behavioral problems (Depression), sleep disturbance and suicidal ideas. The patient is  not nervous/anxious.     Today's Vitals   02/22/20 1503  BP: 136/66  Pulse: 60  Resp: 16  Temp: (!) 97.3 F (36.3 C)  SpO2: 99%  Weight: 167 lb 9.6 oz (76 kg)  Height: 5\' 6"  (1.676 m)   Body mass index is 27.05 kg/m.  Physical Exam Vitals and nursing note reviewed.  Constitutional:      General: She is not in acute distress.    Appearance: Normal appearance. She is well-developed. She is not diaphoretic.  HENT:     Head: Normocephalic and atraumatic.     Mouth/Throat:     Pharynx: No oropharyngeal exudate.  Eyes:     Pupils: Pupils are equal, round, and reactive to light.  Neck:     Thyroid: No thyromegaly.     Vascular: No JVD.     Trachea: No tracheal deviation.  Cardiovascular:     Rate and Rhythm: Normal rate and regular rhythm.     Heart sounds: Normal heart sounds. No murmur heard.  No friction rub. No gallop.   Pulmonary:     Effort: Pulmonary effort is normal. No respiratory distress.     Breath sounds: Normal breath sounds. No wheezing or rales.  Chest:     Chest wall: No tenderness.  Abdominal:     Palpations: Abdomen is soft.  Musculoskeletal:        General: Normal range of motion.      Cervical back: Normal range of motion and neck supple.  Lymphadenopathy:     Cervical: No cervical adenopathy.  Skin:    General: Skin is warm and dry.  Neurological:     Mental Status: She is alert and oriented to person, place, and time. Mental status is at baseline.     Cranial Nerves: No cranial nerve deficit.  Psychiatric:        Mood and Affect: Mood normal.        Behavior: Behavior normal.        Thought Content: Thought content normal.        Judgment: Judgment normal.    Assessment/Plan: 1. Uncontrolled type 2 diabetes mellitus with hyperglycemia (HCC) - POCT Glucose (CBG) 345 today. Increase Touojeo to 20 units everyday. Continue to monitor blood sugars daily. Will see her back in one week for continued evaluation.   2. Essential hypertension Continue bp medications as prescribed   General Counseling: Adriana Miller understanding of the findings of todays visit and agrees with plan of treatment. I have discussed any further diagnostic evaluation that may be needed or ordered today. We also reviewed her medications today. she has been encouraged to call the office with any questions or concerns that should arise related to todays visit.  This patient was seen by Leretha Pol FNP Collaboration with Dr Lavera Guise as a part of collaborative care agreement  Orders Placed This Encounter  Procedures  . POCT Glucose (CBG)    Meds ordered this encounter  Medications  . DISCONTD: insulin glargine, 1 Unit Dial, (TOUJEO SOLOSTAR) 300 UNIT/ML Solostar Pen    Sig: Inject 20 Units into the skin daily.    Dispense:  3 mL    Refill:  1    Increased dose to 20 units daily    Order Specific Question:   Supervising Provider    Answer:   Lavera Guise [[id number]]    Total time spent: 25 Minutes   Time spent includes review of chart, medications, test results, and follow up  plan with the patient.      Dr Lavera Guise Internal medicine

## 2020-03-03 ENCOUNTER — Other Ambulatory Visit: Payer: Self-pay

## 2020-03-03 DIAGNOSIS — E1165 Type 2 diabetes mellitus with hyperglycemia: Secondary | ICD-10-CM

## 2020-03-03 MED ORDER — TOUJEO SOLOSTAR 300 UNIT/ML ~~LOC~~ SOPN: 20.0000 [IU] | PEN_INJECTOR | Freq: Every day | SUBCUTANEOUS | 1 refills | Status: DC

## 2020-03-04 ENCOUNTER — Other Ambulatory Visit: Payer: Self-pay

## 2020-03-04 ENCOUNTER — Ambulatory Visit (INDEPENDENT_AMBULATORY_CARE_PROVIDER_SITE_OTHER): Payer: Medicare Other | Admitting: Internal Medicine

## 2020-03-04 ENCOUNTER — Encounter: Payer: Self-pay | Admitting: Internal Medicine

## 2020-03-04 ENCOUNTER — Telehealth: Payer: Self-pay

## 2020-03-04 VITALS — BP 140/68 | HR 78 | Temp 97.9°F | Resp 16 | Ht 67.0 in | Wt 165.8 lb

## 2020-03-04 DIAGNOSIS — IMO0002 Reserved for concepts with insufficient information to code with codable children: Secondary | ICD-10-CM

## 2020-03-04 DIAGNOSIS — E1165 Type 2 diabetes mellitus with hyperglycemia: Secondary | ICD-10-CM

## 2020-03-04 DIAGNOSIS — F321 Major depressive disorder, single episode, moderate: Secondary | ICD-10-CM

## 2020-03-04 DIAGNOSIS — Z515 Encounter for palliative care: Secondary | ICD-10-CM | POA: Diagnosis not present

## 2020-03-04 DIAGNOSIS — E1129 Type 2 diabetes mellitus with other diabetic kidney complication: Secondary | ICD-10-CM | POA: Diagnosis not present

## 2020-03-04 DIAGNOSIS — C220 Liver cell carcinoma: Secondary | ICD-10-CM

## 2020-03-04 LAB — GLUCOSE, POCT (MANUAL RESULT ENTRY): POC Glucose: 364 mg/dl — AB (ref 70–99)

## 2020-03-04 MED ORDER — INSULIN PEN NEEDLE 32G X 4 MM MISC: 0 refills | Status: DC

## 2020-03-04 MED ORDER — TOUJEO SOLOSTAR 300 UNIT/ML ~~LOC~~ SOPN: PEN_INJECTOR | SUBCUTANEOUS | 3 refills | Status: DC

## 2020-03-04 NOTE — Telephone Encounter (Signed)
ok 

## 2020-03-04 NOTE — Progress Notes (Signed)
[facility] [place], [place] [address]  Internal MEDICINE  Office Visit Note  Patient Name: Adriana Miller  [id number]  [id number]  Date of Service: 03/05/2020  Chief Complaint  Patient presents with  . Follow-up    blood sugar check    HPI Pt is here for routine follow up. Her oral hypoglycemics were stopped due to uncontrolled dm. Tujeo was started and has been increased gradually., Her numbers are still high but following a downward trend. DX of hepatocelular carcinoma with palliative care.  Her MR abdomen from 07/23/2019 had the following findings  1. Cirrhosis. Posterior segment 2 left liver lobe 2.0 cm mass and inferior segment 3 left liver lobe 1.7 cm mass, both with avid arterial hyperenhancement, both considered LR-5 (definitely [place]). 2. No ascites. 3. Cholelithiasis. No biliary ductal dilatation. No choledocholithiasis. 4. Small hiatal hernia. 5. Mild left colonic diverticulosis. Followed by GI and palliative care  Current Medication: Outpatient Encounter Medications as of 03/04/2020  Medication Sig  . allopurinol (ZYLOPRIM) 100 MG tablet TAKE 1 TABLET BY MOUTH EVERY DAY  . ELIQUIS 2.5 MG TABS tablet TAKE 1 TABLET BY MOUTH TWICE A DAY  . escitalopram (LEXAPRO) 10 MG tablet TAKE 1 TABLET (10 MG TOTAL) BY MOUTH DAILY. FOR ANXIETY/SLEEP  . furosemide (LASIX) 20 MG tablet TAKE 1 TABLET BY MOUTH EVERY DAY  . glucose blood (ACCU-CHEK GUIDE) test strip Use as instructed once a daily DX e11.65  . insulin glargine, 1 Unit Dial, (TOUJEO SOLOSTAR) 300 UNIT/ML Solostar Pen Use 30 units sq in the evening with food  . Insulin Pen Needle 32G X 4 MM MISC Use as directed  With toujeo  . metoprolol tartrate (LOPRESSOR) 25 MG tablet TAKE 1/2 TABLET BY MOUTH TWICE A DAY  . Safety Lancets 28G MISC Use as directed once a daily Dx e11.65  . spironolactone (ALDACTONE) 25 MG tablet Take 1 tablet (25 mg total) by mouth daily. For cirrhosis of liver  . traZODone  (DESYREL) 50 MG tablet Take 0.5-1 tablets (25-50 mg total) by mouth at bedtime as needed for sleep.  . [DISCONTINUED] apixaban (ELIQUIS) 5 MG TABS tablet Take 1 tablet (5 mg total) by mouth 2 (two) times daily.  . [DISCONTINUED] insulin glargine, 1 Unit Dial, (TOUJEO SOLOSTAR) 300 UNIT/ML Solostar Pen Inject 20 Units into the skin daily.  . [DISCONTINUED] Insulin Pen Needle 32G X 4 MM MISC Use as directed  With toujeo   No facility-administered encounter medications on file as of 03/04/2020.    Surgical History: Past Surgical History:  Procedure Laterality Date  . CEREBRAL ANEURYSM REPAIR  1991  . COLONOSCOPY WITH PROPOFOL N/A 02/12/2019   Procedure: COLONOSCOPY WITH PROPOFOL;  Surgeon: Lin Landsman, MD;  Location: [facility] ENDOSCOPY;  Service: Gastroenterology;  Laterality: N/A;  . ESOPHAGOGASTRODUODENOSCOPY (EGD) WITH PROPOFOL N/A 02/12/2019   Procedure: ESOPHAGOGASTRODUODENOSCOPY (EGD) WITH PROPOFOL;  Surgeon: Lin Landsman, MD;  Location: [facility] ENDOSCOPY;  Service: Gastroenterology;  Laterality: N/A;  . IR ANGIOGRAM SELECTIVE EACH ADDITIONAL VESSEL  09/21/2019  . IR ANGIOGRAM SELECTIVE EACH ADDITIONAL VESSEL  09/21/2019  . IR ANGIOGRAM SELECTIVE EACH ADDITIONAL VESSEL  09/21/2019  . IR ANGIOGRAM SELECTIVE EACH ADDITIONAL VESSEL  09/21/2019  . IR ANGIOGRAM SELECTIVE EACH ADDITIONAL VESSEL  10/02/2019  . IR ANGIOGRAM VISCERAL SELECTIVE  09/21/2019  . IR ANGIOGRAM VISCERAL SELECTIVE  09/21/2019  . IR ANGIOGRAM VISCERAL SELECTIVE  10/02/2019  . IR EMBO TUMOR ORGAN ISCHEMIA INFARCT INC GUIDE ROADMAPPING  10/02/2019  . IR EMBO  TUMOR ORGAN ISCHEMIA INFARCT INC GUIDE ROADMAPPING  10/02/2019  . IR RADIOLOGIST EVAL & MGMT  08/28/2019  . IR RADIOLOGIST EVAL & MGMT  01/30/2020  . IR US GUIDE VASC ACCESS LEFT  09/21/2019  . IR US GUIDE VASC ACCESS LEFT  10/02/2019    Medical History: Past Medical History:  Diagnosis Date  . B12 deficiency 04/05/2017  . Blood in stool   . Cerebral aneurysm   .  Chronic kidney disease   . Diabetes mellitus without complication ([place])   . Gastric erosion with bleeding   . Hyperlipidemia   . Hypertension   . Lower extremity cellulitis 02/08/2019  . Seizure disorder [facility])     Family History: Family History  Problem Relation Age of Onset  . Heart attack Brother 48    Social History   Socioeconomic History  . Marital status: Divorced    Spouse name: Not on file  . Number of children: Not on file  . Years of education: Not on file  . Highest education level: Not on file  Occupational History  . Not on file  Tobacco Use  . Smoking status: Never Smoker  . Smokeless tobacco: Current User    Types: Snuff  Vaping Use  . Vaping Use: Never used  Substance and Sexual Activity  . Alcohol use: Not Currently    Alcohol/week: 14.0 standard drinks    Types: 14 Cans of beer per week    Comment: patient quit in october 2020  . Drug use: No  . Sexual activity: Not Currently  Other Topics Concern  . Not on file  Social History Narrative  . Not on file   Social Determinants of Health   Financial Resource Strain:   . Difficulty of Paying Living Expenses: Not on file  Food Insecurity:   . Worried About Charity fundraiser in the Last Year: Not on file  . Ran Out of Food in the Last Year: Not on file  Transportation Needs:   . Lack of Transportation (Medical): Not on file  . Lack of Transportation (Non-Medical): Not on file  Physical Activity:   . Days of Exercise per Week: Not on file  . Minutes of Exercise per Session: Not on file  Stress:   . Feeling of Stress : Not on file  Social Connections:   . Frequency of Communication with Friends and Family: Not on file  . Frequency of Social Gatherings with Friends and Family: Not on file  . Attends Religious Services: Not on file  . Active Member of Clubs or Organizations: Not on file  . Attends Archivist Meetings: Not on file  . Marital Status: Not on file  Intimate Partner  Violence:   . Fear of Current or Ex-Partner: Not on file  . Emotionally Abused: Not on file  . Physically Abused: Not on file  . Sexually Abused: Not on file      Review of Systems  Constitutional: Negative for chills, diaphoresis and fatigue.  HENT: Negative for ear pain, postnasal drip and sinus pressure.   Eyes: Negative for photophobia, discharge, redness, itching and visual disturbance.  Respiratory: Negative for cough, shortness of breath and wheezing.   Cardiovascular: Negative for chest pain, palpitations and leg swelling.  Gastrointestinal: Negative for abdominal pain, constipation, diarrhea, nausea and vomiting.  Genitourinary: Negative for dysuria and flank pain.  Musculoskeletal: Negative for arthralgias, back pain, gait problem and neck pain.  Skin: Negative for color change.  Allergic/Immunologic: Negative for environmental  allergies and food allergies.  Neurological: Negative for dizziness and headaches.  Hematological: Does not bruise/bleed easily.  Psychiatric/Behavioral: Negative for agitation, behavioral problems (depression) and hallucinations.    Vital Signs: BP 140/68   Pulse 78   Temp 97.9 F (36.6 C)   Resp 16   Ht 5\' 7"  (1.702 m)   Wt 165 lb 12.8 oz (75.2 kg)   SpO2 97%   BMI 25.97 kg/m    Physical Exam Constitutional:      General: She is not in acute distress.    Appearance: She is well-developed. She is not diaphoretic.  HENT:     Head: Normocephalic and atraumatic.     Mouth/Throat:     Pharynx: No oropharyngeal exudate.  Eyes:     Pupils: Pupils are equal, round, and reactive to light.  Neck:     Thyroid: No thyromegaly.     Vascular: No JVD.     Trachea: No tracheal deviation.  Cardiovascular:     Rate and Rhythm: Normal rate and regular rhythm.     Heart sounds: Normal heart sounds. No murmur heard.  No friction rub. No gallop.   Pulmonary:     Effort: Pulmonary effort is normal. No respiratory distress.     Breath sounds: No  wheezing or rales.  Chest:     Chest wall: No tenderness.  Abdominal:     General: Bowel sounds are normal.     Palpations: Abdomen is soft.  Musculoskeletal:        General: Normal range of motion.     Cervical back: Normal range of motion and neck supple.  Lymphadenopathy:     Cervical: No cervical adenopathy.  Skin:    General: Skin is warm and dry.  Neurological:     Mental Status: She is alert and oriented to person, place, and time.     Cranial Nerves: No cranial nerve deficit.  Psychiatric:        Behavior: Behavior normal.        Thought Content: Thought content normal.        Judgment: Judgment normal.      Assessment/Plan: 1. 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 Will increase Toujeo to 30 units every day, meal time insulin will be decided on later visits  - POCT Glucose (CBG) - insulin glargine, 1 Unit Dial, (TOUJEO SOLOSTAR) 300 UNIT/ML Solostar Pen; Use 30 units sq in the evening with food  Dispense: 9 mL; Refill: 3 - Insulin Pen Needle 32G X 4 MM MISC; Use as directed  With toujeo  Dispense: 100 each; Refill: 0  2. Hepatocellular carcinoma ([place]) Followed by GI  3. Palliative care status Due to being not a surgical candidate, she is with palliative care   4. Depression, major, single episode, moderate (HCC) Pt is on Lexapro and trazodone, does well with it  General Counseling: Pria verbalizes understanding of the findings of todays visit and agrees with plan of treatment. I have discussed any further diagnostic evaluation that may be needed or ordered today. We also reviewed her medications today. she has been encouraged to call the office with any questions or concerns that should arise related to todays visit.    Orders Placed This Encounter  Procedures  . POCT Glucose (CBG)    Meds ordered this encounter  Medications  . insulin glargine, 1 Unit Dial, (TOUJEO  SOLOSTAR) 300 UNIT/ML Solostar Pen    Sig:  Use 30 units sq in the evening with food    Dispense:  9 mL    Refill:  3  . Insulin Pen Needle 32G X 4 MM MISC    Sig: Use as directed  With toujeo    Dispense:  100 each    Refill:  0    Total time spent:30 Minutes Time spent includes review of chart, medications, test results, and follow up plan with the patient.      Dr Lavera Guise Internal medicine

## 2020-03-04 NOTE — Telephone Encounter (Signed)
-----   Message from Lavera Guise, MD sent at 03/04/2020  3:27 PM EST ----- She said her pharmacy needs to talk to [place]

## 2020-03-08 LAB — GLUCOSE, POCT (MANUAL RESULT ENTRY): POC Glucose: 510 mg/dl — AB (ref 70–99)

## 2020-03-08 MED ORDER — TOUJEO SOLOSTAR 300 UNIT/ML ~~LOC~~ SOPN: PEN_INJECTOR | SUBCUTANEOUS | 3 refills | Status: DC

## 2020-04-01 ENCOUNTER — Ambulatory Visit (INDEPENDENT_AMBULATORY_CARE_PROVIDER_SITE_OTHER): Payer: Medicare Other | Admitting: Nurse Practitioner

## 2020-04-01 ENCOUNTER — Other Ambulatory Visit: Payer: Self-pay

## 2020-04-01 ENCOUNTER — Encounter: Payer: Self-pay | Admitting: Nurse Practitioner

## 2020-04-01 VITALS — BP 168/68 | HR 70 | Temp 97.3°F | Resp 16 | Ht 66.0 in | Wt 171.2 lb

## 2020-04-01 DIAGNOSIS — E1129 Type 2 diabetes mellitus with other diabetic kidney complication: Secondary | ICD-10-CM

## 2020-04-01 DIAGNOSIS — R739 Hyperglycemia, unspecified: Secondary | ICD-10-CM

## 2020-04-01 DIAGNOSIS — E538 Deficiency of other specified B group vitamins: Secondary | ICD-10-CM | POA: Diagnosis not present

## 2020-04-01 DIAGNOSIS — R7301 Impaired fasting glucose: Secondary | ICD-10-CM

## 2020-04-01 DIAGNOSIS — E1165 Type 2 diabetes mellitus with hyperglycemia: Secondary | ICD-10-CM

## 2020-04-01 DIAGNOSIS — I10 Essential (primary) hypertension: Secondary | ICD-10-CM | POA: Diagnosis not present

## 2020-04-01 DIAGNOSIS — IMO0002 Reserved for concepts with insufficient information to code with codable children: Secondary | ICD-10-CM

## 2020-04-01 LAB — GLUCOSE, POCT (MANUAL RESULT ENTRY): POC Glucose: 265 mg/dl — AB (ref 70–99)

## 2020-04-01 MED ORDER — TOUJEO SOLOSTAR 300 UNIT/ML ~~LOC~~ SOPN: PEN_INJECTOR | SUBCUTANEOUS | 3 refills | Status: DC

## 2020-04-01 MED ORDER — CYANOCOBALAMIN 1000 MCG/ML IJ SOLN
1000.0000 ug | Freq: Once | INTRAMUSCULAR | Status: AC
  Administered 2020-04-01: 12:00:00 1000 ug via INTRAMUSCULAR

## 2020-04-01 NOTE — Progress Notes (Signed)
[facility] [place], [place] [address]  Internal MEDICINE  Office Visit Note  Patient Name: Adriana Miller  [id number]  [id number]  Date of Service: 05/04/2020  Chief Complaint  Patient presents with  . Follow-up  . Diabetes  . Hyperlipidemia  . Hypertension  . Quality Metric Gaps    Dexa, pna, microalbumin    The patient is here for follow up visit. Toujeo dose increased to 30 units at last visit. She has brought blood sugar log with her. Lowest blood  Sugar is 213 with highest being 458. Blood sugars are averaging in the mid 300s. Blood sugars are still high, but general trend is in downward direction. She is tolerating the increased dose well. Has no new concerns or complaints.  Due for b12 injection today.  Blood pressure is elevated at today's visit. She states she has not taken her blood pressure medication yet today. It is generally well controlled.       Current Medication: Outpatient Encounter Medications as of 04/01/2020  Medication Sig  . allopurinol (ZYLOPRIM) 100 MG tablet TAKE 1 TABLET BY MOUTH EVERY DAY  . ELIQUIS 2.5 MG TABS tablet TAKE 1 TABLET BY MOUTH TWICE A DAY  . escitalopram (LEXAPRO) 10 MG tablet TAKE 1 TABLET (10 MG TOTAL) BY MOUTH DAILY. FOR ANXIETY/SLEEP  . glucose blood (ACCU-CHEK GUIDE) test strip Use as instructed once a daily DX e11.65  . Insulin Pen Needle 32G X 4 MM MISC Use as directed  With toujeo  . metoprolol tartrate (LOPRESSOR) 25 MG tablet TAKE 1/2 TABLET BY MOUTH TWICE A DAY  . Safety Lancets 28G MISC Use as directed once a daily Dx e11.65  . spironolactone (ALDACTONE) 25 MG tablet Take 1 tablet (25 mg total) by mouth daily. For cirrhosis of liver  . traZODone (DESYREL) 50 MG tablet Take 0.5-1 tablets (25-50 mg total) by mouth at bedtime as needed for sleep.  . [DISCONTINUED] furosemide (LASIX) 20 MG tablet TAKE 1 TABLET BY MOUTH EVERY DAY  . [DISCONTINUED] insulin glargine, 1 Unit Dial, (TOUJEO SOLOSTAR) 300  UNIT/ML Solostar Pen Use 30 units sq in the evening with food  . insulin glargine, 1 Unit Dial, (TOUJEO SOLOSTAR) 300 UNIT/ML Solostar Pen Use 35 units sq in the evening with food  . [DISCONTINUED] insulin glargine, 1 Unit Dial, (TOUJEO SOLOSTAR) 300 UNIT/ML Solostar Pen Use 30 units sq in the evening with food  . [EXPIRED] cyanocobalamin ((VITAMIN B-12)) injection 1,000 mcg    No facility-administered encounter medications on file as of 04/01/2020.    Surgical History: Past Surgical History:  Procedure Laterality Date  . CEREBRAL ANEURYSM REPAIR  1991  . COLONOSCOPY WITH PROPOFOL N/A 02/12/2019   Procedure: COLONOSCOPY WITH PROPOFOL;  Surgeon: Lin Landsman, MD;  Location: [facility] ENDOSCOPY;  Service: Gastroenterology;  Laterality: N/A;  . ESOPHAGOGASTRODUODENOSCOPY (EGD) WITH PROPOFOL N/A 02/12/2019   Procedure: ESOPHAGOGASTRODUODENOSCOPY (EGD) WITH PROPOFOL;  Surgeon: Lin Landsman, MD;  Location: [facility] ENDOSCOPY;  Service: Gastroenterology;  Laterality: N/A;  . IR ANGIOGRAM SELECTIVE EACH ADDITIONAL VESSEL  09/21/2019  . IR ANGIOGRAM SELECTIVE EACH ADDITIONAL VESSEL  09/21/2019  . IR ANGIOGRAM SELECTIVE EACH ADDITIONAL VESSEL  09/21/2019  . IR ANGIOGRAM SELECTIVE EACH ADDITIONAL VESSEL  09/21/2019  . IR ANGIOGRAM SELECTIVE EACH ADDITIONAL VESSEL  10/02/2019  . IR ANGIOGRAM VISCERAL SELECTIVE  09/21/2019  . IR ANGIOGRAM VISCERAL SELECTIVE  09/21/2019  . IR ANGIOGRAM VISCERAL SELECTIVE  10/02/2019  . IR EMBO TUMOR ORGAN ISCHEMIA INFARCT INC GUIDE ROADMAPPING  10/02/2019  . IR EMBO TUMOR ORGAN ISCHEMIA INFARCT INC GUIDE ROADMAPPING  10/02/2019  . IR RADIOLOGIST EVAL & MGMT  08/28/2019  . IR RADIOLOGIST EVAL & MGMT  01/30/2020  . IR RADIOLOGIST EVAL & MGMT  04/30/2020  . IR US GUIDE VASC ACCESS LEFT  09/21/2019  . IR US GUIDE VASC ACCESS LEFT  10/02/2019    Medical History: Past Medical History:  Diagnosis Date  . B12 deficiency 04/05/2017  . Blood in stool   . Cerebral aneurysm   . Chronic  kidney disease   . Diabetes mellitus without complication ([place])   . Gastric erosion with bleeding   . Hyperlipidemia   . Hypertension   . Lower extremity cellulitis 02/08/2019  . Seizure disorder [facility])     Family History: Family History  Problem Relation Age of Onset  . Heart attack Brother 56    Social History   Socioeconomic History  . Marital status: Divorced    Spouse name: Not on file  . Number of children: Not on file  . Years of education: Not on file  . Highest education level: Not on file  Occupational History  . Not on file  Tobacco Use  . Smoking status: Never Smoker  . Smokeless tobacco: Current User    Types: Snuff  Vaping Use  . Vaping Use: Never used  Substance and Sexual Activity  . Alcohol use: Not Currently    Alcohol/week: 14.0 standard drinks    Types: 14 Cans of beer per week    Comment: patient quit in october 2020  . Drug use: No  . Sexual activity: Not Currently  Other Topics Concern  . Not on file  Social History Narrative  . Not on file   Social Determinants of Health   Financial Resource Strain: Not on file  Food Insecurity: Not on file  Transportation Needs: Not on file  Physical Activity: Not on file  Stress: Not on file  Social Connections: Not on file  Intimate Partner Violence: Not on file      Review of Systems  Constitutional: Negative for activity change, chills, fatigue and unexpected weight change.  HENT: Negative for congestion, postnasal drip, rhinorrhea, sneezing and sore throat.   Respiratory: Negative for cough, chest tightness, shortness of breath and wheezing.   Cardiovascular: Negative for chest pain and palpitations.       Blood pressure elevated, but she states she has not taken her blood pressure medication yet today.   Gastrointestinal: Negative for abdominal pain, constipation, diarrhea, nausea and vomiting.  Endocrine: Negative for cold intolerance, heat intolerance, polydipsia and polyuria.       Blood  sugars mildly improved.   Musculoskeletal: Negative for arthralgias, back pain, joint swelling and neck pain.  Skin: Negative for rash.  Allergic/Immunologic: Negative for environmental allergies.  Neurological: Negative for dizziness, tremors, numbness and headaches.  Hematological: Negative for adenopathy. Does not bruise/bleed easily.  Psychiatric/Behavioral: Negative for behavioral problems (Depression), sleep disturbance and suicidal ideas. The patient is not nervous/anxious.     Today's Vitals   04/01/20 1118  BP: (!) 168/68  Pulse: 70  Resp: 16  Temp: (!) 97.3 F (36.3 C)  SpO2: 98%  Weight: 171 lb 3.2 oz (77.7 kg)  Height: 5\' 6"  (1.676 m)   Body mass index is 27.63 kg/m.  Physical Exam Vitals and nursing note reviewed.  Constitutional:      General: She is not in acute distress.    Appearance: Normal appearance. She is well-developed  and well-nourished. She is not diaphoretic.  HENT:     Head: Normocephalic and atraumatic.     Nose: Nose normal.     Mouth/Throat:     Mouth: Oropharynx is clear and moist.     Pharynx: No oropharyngeal exudate.  Eyes:     Extraocular Movements: EOM normal.     Pupils: Pupils are equal, round, and reactive to light.  Neck:     Thyroid: No thyromegaly.     Vascular: No carotid bruit or JVD.     Trachea: No tracheal deviation.  Cardiovascular:     Rate and Rhythm: Normal rate and regular rhythm.     Heart sounds: Normal heart sounds. No murmur heard. No friction rub. No gallop.   Pulmonary:     Effort: Pulmonary effort is normal. No respiratory distress.     Breath sounds: Normal breath sounds. No wheezing or rales.  Chest:     Chest wall: No tenderness.  Abdominal:     Palpations: Abdomen is soft.  Musculoskeletal:        General: Normal range of motion.     Cervical back: Normal range of motion and neck supple.  Lymphadenopathy:     Cervical: No cervical adenopathy.  Skin:    General: Skin is warm and dry.   Neurological:     Mental Status: She is alert and oriented to person, place, and time. Mental status is at baseline.     Cranial Nerves: No cranial nerve deficit.  Psychiatric:        Mood and Affect: Mood and affect normal.        Behavior: Behavior normal.        Thought Content: Thought content normal.        Judgment: Judgment normal.    Assessment/Plan: 1. Hyperglycemia - POCT glucose (manual entry) 265 today. Improved. Increase toueo to 35 units every day. Monitor blood sugars closely. Recheck at next visit.   2. 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 Monitor blood sugars closely.  - insulin glargine, 1 Unit Dial, (TOUJEO SOLOSTAR) 300 UNIT/ML Solostar Pen; Use 35 units sq in the evening with food  Dispense: 9 mL; Refill: 3  3. B12 deficiency Vitamin b12 injection administered today.  - cyanocobalamin ((VITAMIN B-12)) injection 1,000 mcg  4. Essential hypertension Generally stable, though elevated today. Continue medication as prescribed. Will monitor closely.   General Counseling: marla pouliot understanding of the findings of todays visit and agrees with plan of treatment. I have discussed any further diagnostic evaluation that may be needed or ordered today. We also reviewed her medications today. she has been encouraged to call the office with any questions or concerns that should arise related to todays visit.  This patient was seen by Leretha Pol FNP Collaboration with Dr Lavera Guise as a part of collaborative care agreement  Orders Placed This Encounter  Procedures  . POCT glucose (manual entry)    Meds ordered this encounter  Medications  . cyanocobalamin ((VITAMIN B-12)) injection 1,000 mcg  . DISCONTD: insulin glargine, 1 Unit Dial, (TOUJEO SOLOSTAR) 300 UNIT/ML Solostar Pen    Sig: Use 30 units sq in the evening with food    Dispense:  9 mL    Refill:  3    Order Specific Question:    Supervising Provider    Answer:   Lavera Guise [place]  . insulin glargine, 1 Unit Dial, (TOUJEO SOLOSTAR) 300 UNIT/ML Solostar  Pen    Sig: Use 35 units sq in the evening with food    Dispense:  9 mL    Refill:  3    Increased dose to 35 units daily    Order Specific Question:   Supervising Provider    Answer:   Lavera Guise [[id number]]    Total time spent: 30 Minutes   Time spent includes review of chart, medications, test results, and follow up plan with the patient.      Dr Lavera Guise Internal medicine

## 2020-04-03 ENCOUNTER — Other Ambulatory Visit: Payer: Medicare Other | Admitting: Primary Care

## 2020-04-03 ENCOUNTER — Other Ambulatory Visit: Payer: Self-pay

## 2020-04-08 ENCOUNTER — Other Ambulatory Visit: Payer: Medicare Other | Admitting: Primary Care

## 2020-04-08 ENCOUNTER — Other Ambulatory Visit: Payer: Self-pay | Admitting: Interventional Radiology

## 2020-04-08 ENCOUNTER — Other Ambulatory Visit: Payer: Self-pay

## 2020-04-08 DIAGNOSIS — C220 Liver cell carcinoma: Secondary | ICD-10-CM

## 2020-04-11 ENCOUNTER — Other Ambulatory Visit: Payer: Self-pay | Admitting: Adult Health

## 2020-04-11 ENCOUNTER — Other Ambulatory Visit: Payer: Self-pay | Admitting: Internal Medicine

## 2020-04-11 DIAGNOSIS — K7031 Alcoholic cirrhosis of liver with ascites: Secondary | ICD-10-CM

## 2020-04-14 ENCOUNTER — Other Ambulatory Visit: Payer: Self-pay

## 2020-04-14 ENCOUNTER — Ambulatory Visit
Admission: RE | Admit: 2020-04-14 | Discharge: 2020-04-14 | Disposition: A | Discharge status: Home / Self Care | Payer: Medicare Other | Source: Ambulatory Visit | Attending: Oncology | Admitting: Oncology

## 2020-04-14 DIAGNOSIS — D7389 Other diseases of spleen: Secondary | ICD-10-CM | POA: Diagnosis not present

## 2020-04-14 DIAGNOSIS — C220 Liver cell carcinoma: Secondary | ICD-10-CM | POA: Diagnosis not present

## 2020-04-14 DIAGNOSIS — K8020 Calculus of gallbladder without cholecystitis without obstruction: Secondary | ICD-10-CM | POA: Diagnosis not present

## 2020-04-14 DIAGNOSIS — N281 Cyst of kidney, acquired: Secondary | ICD-10-CM | POA: Diagnosis not present

## 2020-04-14 IMAGING — MR MR ABDOMEN WO/W CM
16 of 17 series · 45 of 48 positions shown · IV contrast (gadavist)
Comparison: [DATE]

CLINICAL DATA: Follow-up hepatocellular carcinoma.

EXAM:
MRI ABDOMEN WITHOUT AND WITH CONTRAST
TECHNIQUE: Multiplanar multisequence MR imaging of the abdomen was performed
both before and after the administration of intravenous contrast.
CONTRAST:  7.5mL GADAVIST GADOBUTROL 1 MMOL/ML IV SOLN

[Series 3: T2 · coronal · 6.0mm · 1.19mm/px · 2 of 30 slices shown (1 of 2)]
[im 1/30]
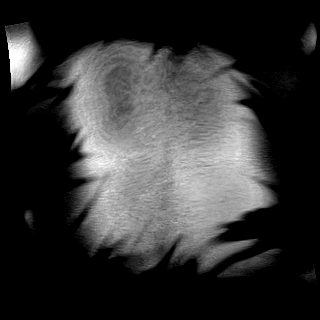
[im 30/30]
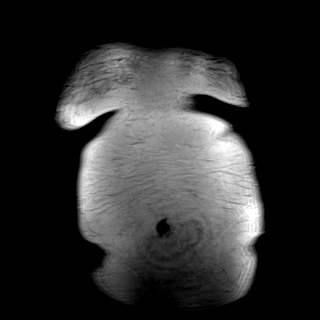

[Series 6: T2 fat-sat · axial · 6.0mm · 1.19mm/px · z∈[-55,+182]mm · 2 of 34 slices shown]
[im 1/34]
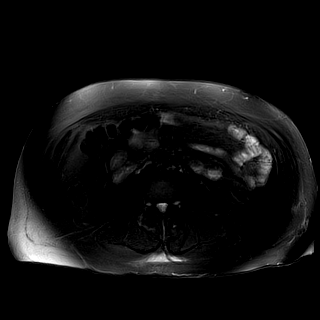
[im 34/34]
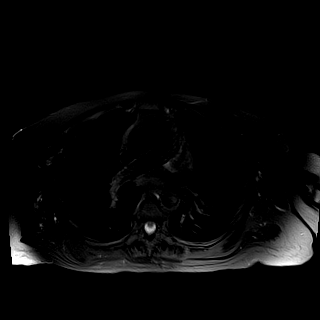

[Series 7: ax dwi_tracew · axial · 6.0mm · 1.42mm/px · z∈[-55,+182]mm · 5 of 102 slices shown]
[im 1/102]
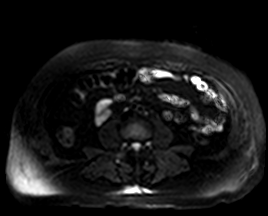
[im 26/102]
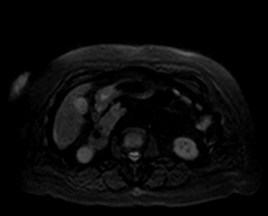
[im 51/102]
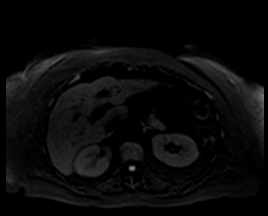
[im 76/102]
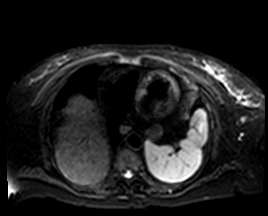
[im 102/102]
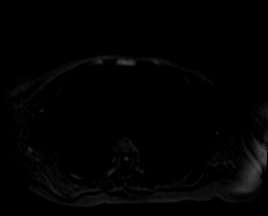

[Series 8: ax dwi_adc · axial · 6.0mm · 1.42mm/px · z∈[-55,+182]mm · 2 of 34 slices shown]
[im 1/34]
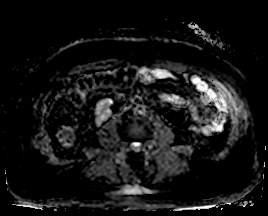
[im 34/34]
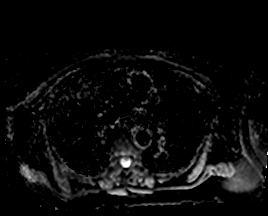

[Series 9: T1 · axial · 6.0mm · 0.74mm/px · z∈[-48,+175]mm · 4 of 64 slices shown]
[im 1/64]
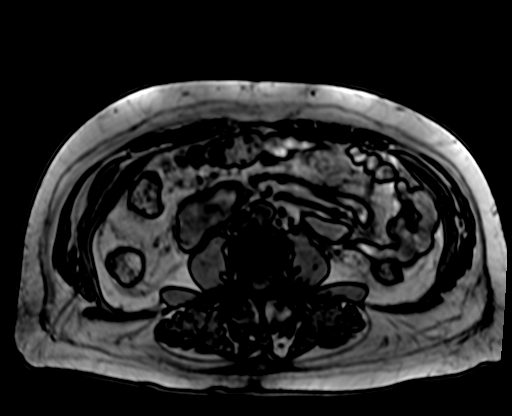
[im 22/64]
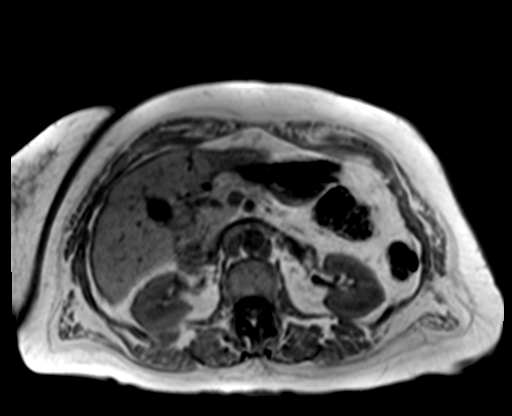
[im 43/64]
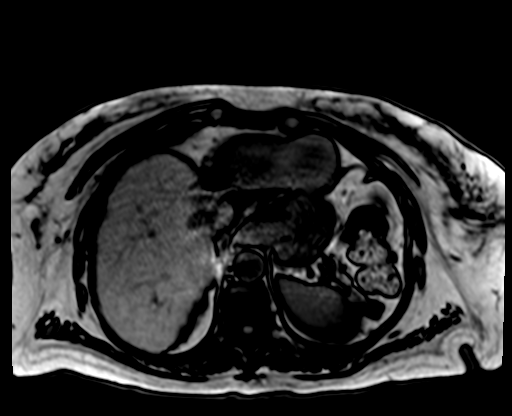
[im 64/64]
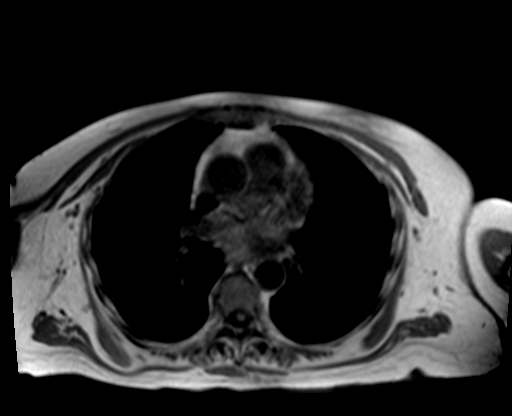

[Series 10: bSSFP · axial · 6.0mm · 0.74mm/px · z∈[-48,+175]mm · 2 of 32 slices shown]
[im 1/32]
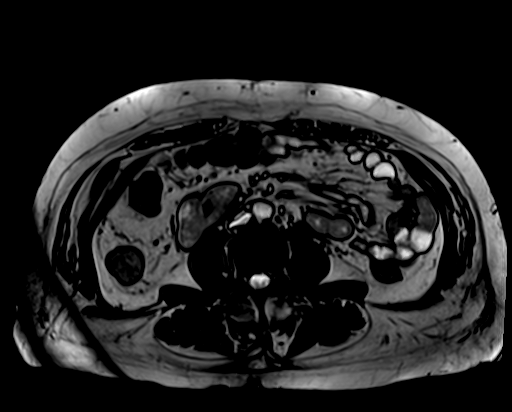
[im 32/32]
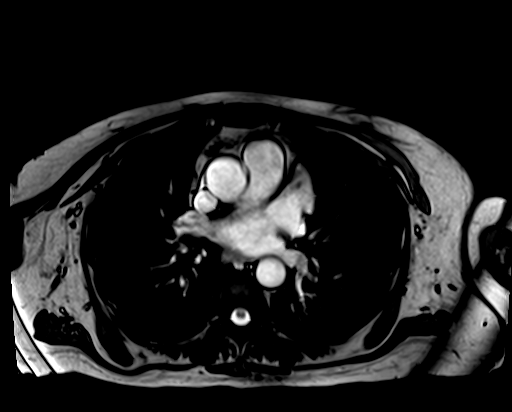

[Series 11: T2 · axial · 6.0mm · 1.19mm/px · 1 of 32 slices shown (2 of 2)]
[im 1/32]
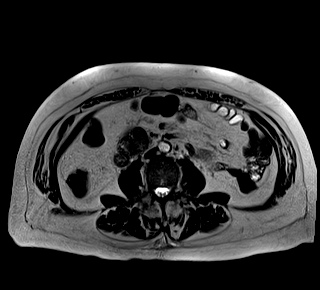

[Series 12: T1 dynamic fat-sat · axial · non-contrast · 3.0mm · 1.19mm/px · z∈[-55,+182]mm · 3 of 80 slices shown (1 of 4)]
[im 1/80]
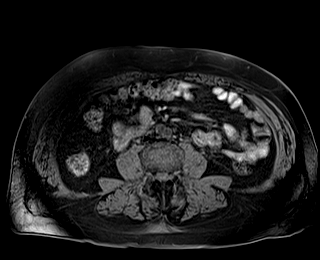
[im 40/80]
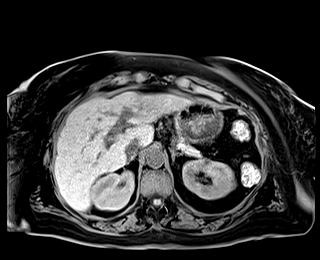
[im 80/80]
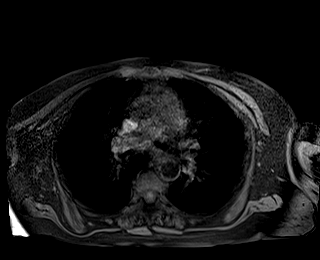

[Series 13: T1 dynamic fat-sat post-contrast · axial · 3.0mm · 1.19mm/px · z∈[-55,+182]mm · 3 of 80 slices shown (1 of 4)]
[im 1/80]
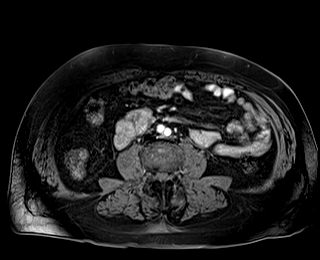
[im 40/80]
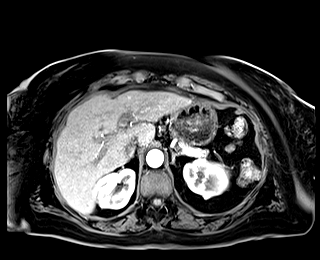
[im 80/80]
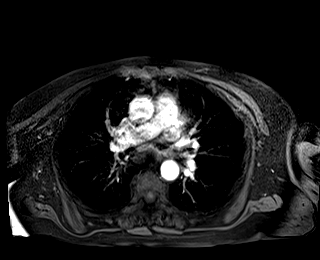

[Series 14: T1 dynamic fat-sat · axial · 3.0mm · 1.19mm/px · z∈[-55,+182]mm · 3 of 80 slices shown (2 of 4)]
[im 1/80]
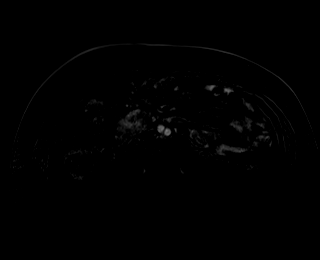
[im 40/80]
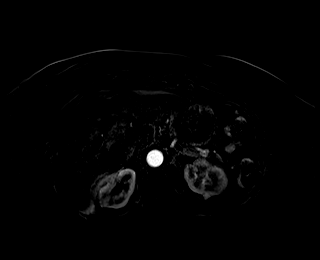
[im 80/80]
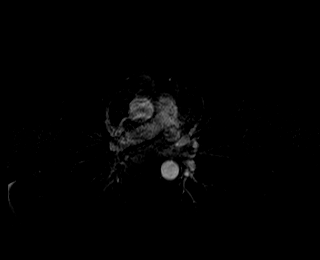

[Series 15: T1 dynamic fat-sat post-contrast · axial · 3.0mm · 1.19mm/px · z∈[-55,+182]mm · 3 of 80 slices shown (2 of 4)]
[im 1/80]
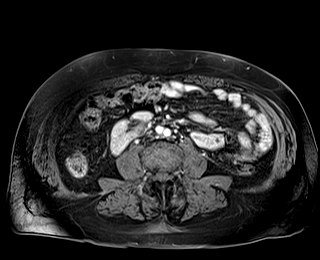
[im 40/80]
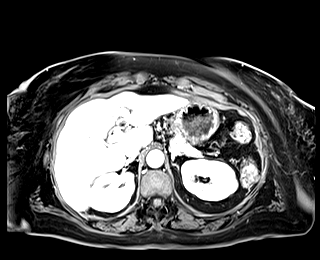
[im 80/80]
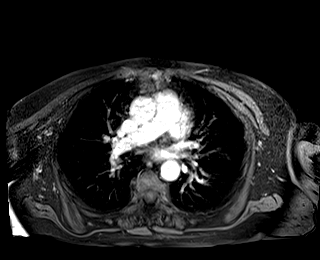

[Series 16: T1 dynamic fat-sat · axial · 3.0mm · 1.19mm/px · z∈[-55,+182]mm · 3 of 80 slices shown (3 of 4)]
[im 1/80]
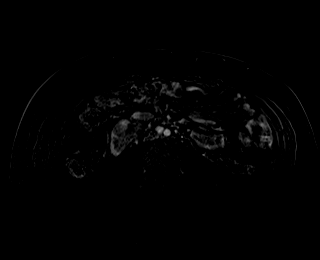
[im 40/80]
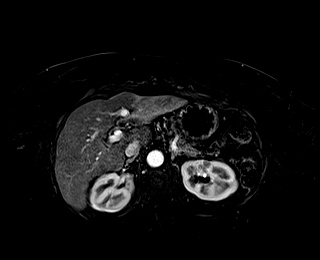
[im 80/80]
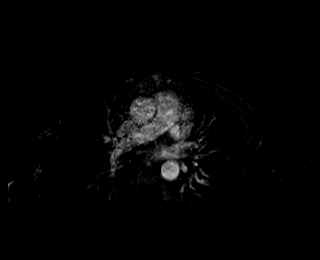

[Series 17: T1 dynamic fat-sat post-contrast · axial · 3.0mm · 1.19mm/px · z∈[-55,+182]mm · 3 of 80 slices shown (3 of 4)]
[im 1/80]
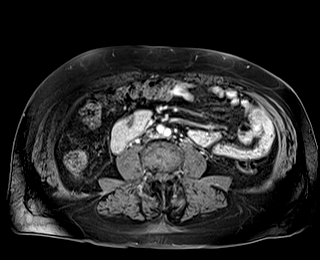
[im 40/80]
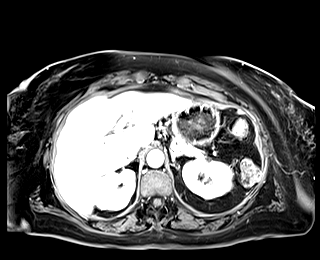
[im 80/80]
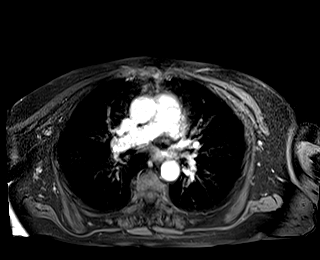

[Series 18: T1 dynamic fat-sat · axial · 3.0mm · 1.19mm/px · z∈[-55,+182]mm · 3 of 80 slices shown (4 of 4)]
[im 1/80]
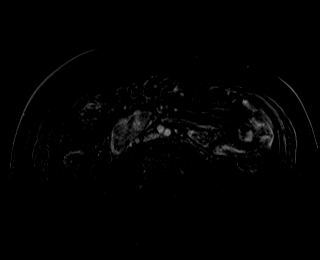
[im 40/80]
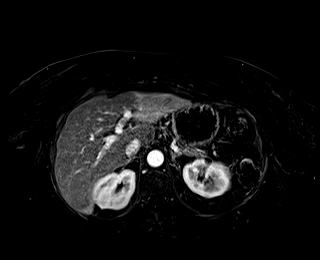
[im 80/80]
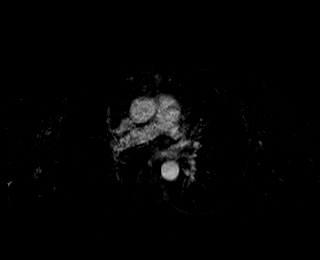

[Series 19: T1 dynamic post-contrast · coronal · 3.0mm · 1.31mm/px · 3 of 72 slices shown]
[im 1/72]
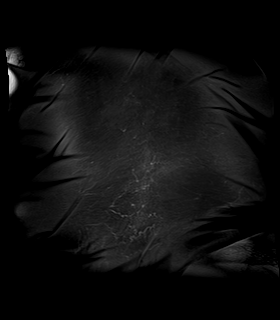
[im 36/72]
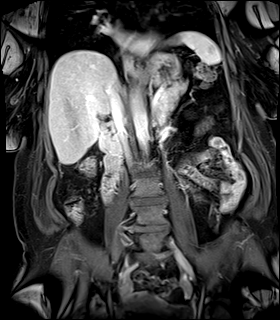
[im 72/72]
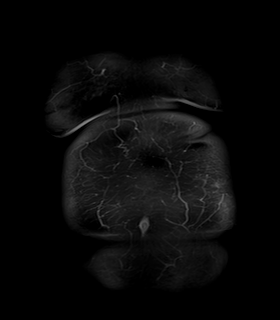

[Series 20: T1 dynamic fat-sat post-contrast · axial · 3.0mm · 1.19mm/px · z∈[-55,+182]mm · 3 of 80 slices shown (4 of 4)]
[im 1/80]
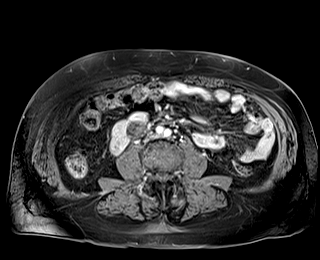
[im 40/80]
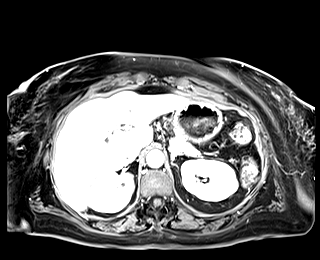
[im 80/80]
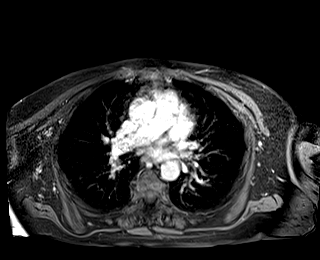

[45 of 48 positions shown; findings below may reference images not displayed]

FINDINGS: Lower chest: No acute findings.

Hepatobiliary: Tiny stones noted within the gallbladder. No signs of
gallbladder wall inflammation or bile duct dilatation.

Morphologic features of cirrhosis noted within the liver. Treated
lesion within segment 2 is again noted. No signs of arterial phase
enhancement on today's study. On the 45 second delayed image the
treated lesion appears hypointense measuring 1.4 x 1.0 cm, image
35/15. This is compared with 1.5 x 1.3 cm previously.

The second lesion of concern in segment 3 shows no arterial phase
enhancement on today's study. On the delayed sequences a subtle area
of hypointensity measures 1.2 x 0.8 cm, image 45/15. Previously this
measured 1.7 x 1.1 cm.

No new liver lesions identified.  No signs of restricted diffusion.

Pancreas: No mass, inflammatory changes, or other parenchymal
abnormality identified.

Spleen: Lesion within the anterior spleen measures 1.4 cm, image
19/15. Unchanged from previous exam.

Adrenals/Urinary Tract: Normal appearance of the adrenal glands.
Small slightly exophytic lesion arising off the posterior cortex of
the upper pole of left kidney measures 1.0 x 0.8 cm, image 36/15.
This shows diffusely increased T1 signal compatible with a
hemorrhagic cyst. Anterior cortex of left mid kidney cyst measures
0.7 cm, image 39/15.

Stomach/Bowel: Visualized portions within the abdomen are
unremarkable.

Vascular/Lymphatic: Normal appearance of the abdominal aorta. No
adenopathy.

Other:  No free fluid or fluid collections.

Musculoskeletal: No suspicious bone lesions identified.
IMPRESSION: 1. Continued treatment response to lesions involving segment 2 and
segment 3. No arterial phase enhancement associated with these
lesions on today's exam to suggest viable tumor.
2. Stable appearance of small hemorrhagic cyst arising off the
posterior cortex of the upper pole of left kidney.
3. Stable splenic lesion.
4. Cholelithiasis.

## 2020-04-14 MED ORDER — GADOBUTROL 1 MMOL/ML IV SOLN
7.5000 mL | Freq: Once | INTRAVENOUS | Status: AC | PRN
  Administered 2020-04-14: 13:00:00 7.5 mL via INTRAVENOUS

## 2020-04-17 ENCOUNTER — Inpatient Hospital Stay: Payer: Medicare Other

## 2020-04-17 ENCOUNTER — Encounter: Payer: Self-pay | Admitting: Oncology

## 2020-04-17 ENCOUNTER — Inpatient Hospital Stay: Payer: Medicare Other | Attending: Oncology | Admitting: Oncology

## 2020-04-17 ENCOUNTER — Other Ambulatory Visit: Payer: Self-pay

## 2020-04-17 VITALS — BP 157/67 | HR 62 | Temp 98.1°F | Resp 18 | Wt 168.1 lb

## 2020-04-17 DIAGNOSIS — N189 Chronic kidney disease, unspecified: Secondary | ICD-10-CM | POA: Diagnosis not present

## 2020-04-17 DIAGNOSIS — Z8249 Family history of ischemic heart disease and other diseases of the circulatory system: Secondary | ICD-10-CM | POA: Insufficient documentation

## 2020-04-17 DIAGNOSIS — Z7901 Long term (current) use of anticoagulants: Secondary | ICD-10-CM | POA: Diagnosis not present

## 2020-04-17 DIAGNOSIS — E119 Type 2 diabetes mellitus without complications: Secondary | ICD-10-CM | POA: Diagnosis not present

## 2020-04-17 DIAGNOSIS — C220 Liver cell carcinoma: Secondary | ICD-10-CM | POA: Insufficient documentation

## 2020-04-17 DIAGNOSIS — G40909 Epilepsy, unspecified, not intractable, without status epilepticus: Secondary | ICD-10-CM | POA: Insufficient documentation

## 2020-04-17 DIAGNOSIS — D696 Thrombocytopenia, unspecified: Secondary | ICD-10-CM | POA: Diagnosis not present

## 2020-04-17 DIAGNOSIS — Z79899 Other long term (current) drug therapy: Secondary | ICD-10-CM | POA: Diagnosis not present

## 2020-04-17 DIAGNOSIS — I10 Essential (primary) hypertension: Secondary | ICD-10-CM | POA: Insufficient documentation

## 2020-04-17 DIAGNOSIS — Z794 Long term (current) use of insulin: Secondary | ICD-10-CM | POA: Diagnosis not present

## 2020-04-17 DIAGNOSIS — R778 Other specified abnormalities of plasma proteins: Secondary | ICD-10-CM | POA: Diagnosis not present

## 2020-04-17 DIAGNOSIS — E785 Hyperlipidemia, unspecified: Secondary | ICD-10-CM | POA: Insufficient documentation

## 2020-04-17 LAB — HEPATIC FUNCTION PANEL
ALT: 19 U/L (ref 0–44)
AST: 29 U/L (ref 15–41)
Albumin: 3.5 g/dL (ref 3.5–5.0)
Alkaline Phosphatase: 97 U/L (ref 38–126)
Bilirubin, Direct: 0.2 mg/dL (ref 0.0–0.2)
Indirect Bilirubin: 0.5 mg/dL (ref 0.3–0.9)
Total Bilirubin: 0.7 mg/dL (ref 0.3–1.2)
Total Protein: 8.5 g/dL — ABNORMAL HIGH (ref 6.5–8.1)

## 2020-04-17 NOTE — Progress Notes (Signed)
Pt here for follow up. No new concerns voiced.   

## 2020-04-17 NOTE — Progress Notes (Signed)
Hematology/Oncology follow up  [facility] [phone] [phone]   Patient Care Team: Carlean Jews, NP as PCP - General (Family Medicine) End, Cristal Deer, MD as PCP - Cardiology (Cardiology) Benita Gutter, RN as Oncology Nurse Navigator End, Cristal Deer, MD as Consulting Physician (Cardiology) Eliezer Lofts, NP as Nurse Practitioner (Hospice and Palliative Medicine) Borders, Daryl Eastern, NP as Nurse Practitioner (Hospice and Palliative Medicine)  REFERRING PROVIDER: Carlean Jews, NP  CHIEF COMPLAINTS/REASON FOR VISIT:  Follow-up for Adriana Miller HISTORY OF PRESENTING ILLNESS:   Adriana Miller is a  64 y.o.  female with PMH listed below was seen in consultation at the request of  Carlean Jews, NP  for evaluation of elevated protein Patient has a history of alcoholic cirrhosis.  Recent admission due to bloody diarrhea and anemia due to acute blood loss.  She underwent EGD and colonoscopy.  EGD showed gastric bleeding erosion which was cauterized.  Evidence of portal hypertensive gastropathy.  Colonoscopy showed 2 small polyps that were removed. She has stopped drinking alcohol  Her blood work on 03/28/2019 showed protein level of 9.3, total bilirubin 2.4, # 07/23/2019 MRI liver done which showed 2 liver masses both with avid hyper enhancement, both considered LR-5 (definitely HCC).  I referred patient to have been in consultation with Dr. Hiram Comber surgical oncology at [facility] for resectability. Patient was seen by Dr. Selena Batten on 08/20/2019.  Patient was deemed not a surgical candidate due to her multiple comorbidities, functional status.  She is not a transplant candidate due to her comorbidities and functional status.  Additionally it appears that patient does not want any major abdominal operations.  Patient was recommended to have local regional therapy.  Patient prefers to stay local to proceed with her therapy.   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 female who has above history reviewed by me today presents for follow up visit for management of HCC.  Patient underwent Y 90 hepatic radioembolization in June 2021. She is doing well clinically.  Denies any new complaints.   Review of Systems  Constitutional: Positive for fatigue. Negative for appetite change, chills, fever and unexpected weight change.  HENT:   Negative for hearing loss and voice change.   Eyes: Negative for eye problems.  Respiratory: Negative for chest tightness and cough.   Cardiovascular: Negative for chest pain.  Gastrointestinal: Negative for abdominal distention, abdominal pain and blood in stool.  Endocrine: Negative for hot flashes.  Genitourinary: Negative for difficulty urinating and frequency.   Musculoskeletal: Positive for arthralgias.  Skin: Negative for itching and rash.  Neurological: Negative for extremity weakness. Speech difficulty:    Hematological: Negative for adenopathy. Does not bruise/bleed easily.  Psychiatric/Behavioral: Negative for confusion.    MEDICAL HISTORY:  Past Medical History:  Diagnosis Date  . B12 deficiency 04/05/2017  . Blood in stool   . Cerebral aneurysm   . Chronic kidney disease   . Diabetes mellitus without complication (HCC)   . Gastric erosion with bleeding   . Hyperlipidemia   . Hypertension   . Lower extremity cellulitis 02/08/2019  . Seizure disorder [facility])     SURGICAL HISTORY: Past Surgical History:  Procedure Laterality Date  . CEREBRAL ANEURYSM REPAIR  1991  . COLONOSCOPY WITH PROPOFOL N/A 02/12/2019   Procedure: COLONOSCOPY WITH PROPOFOL;  Surgeon: Toney Reil, MD;  Location: [facility] ENDOSCOPY;  Service: Gastroenterology;  Laterality: N/A;  .  ESOPHAGOGASTRODUODENOSCOPY (EGD) WITH PROPOFOL N/A 02/12/2019   Procedure: ESOPHAGOGASTRODUODENOSCOPY (EGD) WITH PROPOFOL;  Surgeon:  Lin Landsman, MD;  Location: [place];  Service: Gastroenterology;  Laterality: N/A;  . IR ANGIOGRAM SELECTIVE EACH ADDITIONAL VESSEL  09/21/2019  . IR ANGIOGRAM SELECTIVE EACH ADDITIONAL VESSEL  09/21/2019  . IR ANGIOGRAM SELECTIVE EACH ADDITIONAL VESSEL  09/21/2019  . IR ANGIOGRAM SELECTIVE EACH ADDITIONAL VESSEL  09/21/2019  . IR ANGIOGRAM SELECTIVE EACH ADDITIONAL VESSEL  10/02/2019  . IR ANGIOGRAM VISCERAL SELECTIVE  09/21/2019  . IR ANGIOGRAM VISCERAL SELECTIVE  09/21/2019  . IR ANGIOGRAM VISCERAL SELECTIVE  10/02/2019  . IR EMBO TUMOR ORGAN ISCHEMIA INFARCT INC GUIDE ROADMAPPING  10/02/2019  . IR EMBO TUMOR ORGAN ISCHEMIA INFARCT INC GUIDE ROADMAPPING  10/02/2019  . IR RADIOLOGIST EVAL & MGMT  08/28/2019  . IR RADIOLOGIST EVAL & MGMT  01/30/2020  . IR US GUIDE VASC ACCESS LEFT  09/21/2019  . IR US GUIDE VASC ACCESS LEFT  10/02/2019    SOCIAL HISTORY: Social History   Socioeconomic History  . Marital status: Divorced    Spouse name: Not on file  . Number of children: Not on file  . Years of education: Not on file  . Highest education level: Not on file  Occupational History  . Not on file  Tobacco Use  . Smoking status: Never Smoker  . Smokeless tobacco: Current User    Types: Snuff  Vaping Use  . Vaping Use: Never used  Substance and Sexual Activity  . Alcohol use: Not Currently    Alcohol/week: 14.0 standard drinks    Types: 14 Cans of beer per week    Comment: patient quit in october 2020  . Drug use: No  . Sexual activity: Not Currently  Other Topics Concern  . Not on file  Social History Narrative  . Not on file   Social Determinants of Health   Financial Resource Strain: Not on file  Food Insecurity: Not on file  Transportation Needs: Not on file  Physical Activity: Not on file  Stress: Not on file  Social Connections: Not on file  Intimate Partner Violence: Not on file    FAMILY HISTORY: Family History  Problem Relation Age of Onset  . Heart attack  Brother 49    ALLERGIES:  is allergic to aspirin.  MEDICATIONS:  Current Outpatient Medications  Medication Sig Dispense Refill  . allopurinol (ZYLOPRIM) 100 MG tablet TAKE 1 TABLET BY MOUTH EVERY DAY 90 tablet 1  . ELIQUIS 2.5 MG TABS tablet TAKE 1 TABLET BY MOUTH TWICE A DAY 180 tablet 1  . escitalopram (LEXAPRO) 10 MG tablet TAKE 1 TABLET (10 MG TOTAL) BY MOUTH DAILY. FOR ANXIETY/SLEEP 90 tablet 2  . furosemide (LASIX) 20 MG tablet TAKE 1 TABLET BY MOUTH EVERY DAY 90 tablet 0  . glucose blood (ACCU-CHEK GUIDE) test strip Use as instructed once a daily DX e11.65 100 each 1  . insulin glargine, 1 Unit Dial, (TOUJEO SOLOSTAR) 300 UNIT/ML Solostar Pen Use 35 units sq in the evening with food 9 mL 3  . Insulin Pen Needle 32G X 4 MM MISC Use as directed  With toujeo 100 each 0  . metoprolol tartrate (LOPRESSOR) 25 MG tablet TAKE 1/2 TABLET BY MOUTH TWICE A DAY 90 tablet 3  . Safety Lancets 28G MISC Use as directed once a daily Dx e11.65 100 each 1  . spironolactone (ALDACTONE) 25 MG tablet Take 1 tablet (25 mg total) by mouth daily. For cirrhosis of  liver 90 tablet 2  . traZODone (DESYREL) 50 MG tablet Take 0.5-1 tablets (25-50 mg total) by mouth at bedtime as needed for sleep. 30 tablet 2   No current facility-administered medications for this visit.     PHYSICAL EXAMINATION: ECOG PERFORMANCE STATUS: 1 - Symptomatic but completely ambulatory Vitals:   04/17/20 1036  BP: (!) 157/67  Pulse: 62  Resp: 18  Temp: 98.1 F (36.7 C)   Filed Weights   04/17/20 1036  Weight: 168 lb 1.6 oz (76.2 kg)   Vital sign please see Epic Physical Exam Constitutional:      General: She is not in acute distress.    Comments: Patient walks independantly  HENT:     Head: Normocephalic and atraumatic.  Eyes:     General: No scleral icterus. Cardiovascular:     Rate and Rhythm: Normal rate and regular rhythm.     Heart sounds: Normal heart sounds.  Pulmonary:     Effort: Pulmonary effort is  normal. No respiratory distress.     Breath sounds: No wheezing.  Abdominal:     General: Bowel sounds are normal. There is no distension.     Palpations: Abdomen is soft.  Musculoskeletal:        General: No tenderness or deformity. Normal range of motion.     Cervical back: Normal range of motion and neck supple.  Skin:    General: Skin is warm and dry.     Findings: No erythema or rash.  Neurological:     Mental Status: She is alert and oriented to person, place, and time. Mental status is at baseline.     Cranial Nerves: No cranial nerve deficit.     Coordination: Coordination normal.  Psychiatric:        Mood and Affect: Mood normal.     LABORATORY DATA:  I have reviewed the data as listed Lab Results  Component Value Date   WBC 5.3 01/03/2020   HGB 13.2 01/03/2020   HCT 39.1 01/03/2020   MCV 90.7 01/03/2020   PLT 139 (L) 01/03/2020   Recent Labs    10/02/19 0840 11/22/19 1308 01/03/20 1015 04/17/20 1108  NA 141 137 141  --   K 3.8 3.4* 4.3  --   CL 103 99 98  --   CO2 27 26 33*  --   GLUCOSE 147* 195* 298*  --   BUN 20 24* 17  --   CREATININE 1.19* 1.26* 1.27*  --   CALCIUM 9.4 9.1 9.6  --   GFRNONAA 48* 44* 44*  --   GFRAA 55* 51* 51*  --   PROT 8.7* 8.7* 8.9* 8.5*  ALBUMIN 3.6 3.7 3.6 3.5  AST 23 24 22 29   ALT 15 13 15 19   ALKPHOS 89 102 96 97  BILITOT 0.6 0.6 1.0 0.7  BILIDIR  --   --   --  0.2  IBILI  --   --   --  0.5   Iron/TIBC/Ferritin/ %Sat    Component Value Date/Time   IRON 76 02/11/2019 0616   TIBC 122 (L) 02/11/2019 0616   FERRITIN 427 (H) 02/11/2019 0616   IRONPCTSAT 62 (H) 02/11/2019 [id number]      RADIOGRAPHIC STUDIES: I have personally reviewed the radiological images as listed and agreed with the findings in the report. MR LIVER W WO CONTRAST  Result Date: 04/14/2020 CLINICAL DATA:  Follow-up hepatocellular carcinoma. EXAM: MRI ABDOMEN WITHOUT AND WITH CONTRAST TECHNIQUE: Multiplanar multisequence MR imaging  of the abdomen was  performed both before and after the administration of intravenous contrast. CONTRAST:  7.17mL GADAVIST GADOBUTROL 1 MMOL/ML IV SOLN COMPARISON:  01/03/2020 FINDINGS: Lower chest: No acute findings. Hepatobiliary: Tiny stones noted within the gallbladder. No signs of gallbladder wall inflammation or bile duct dilatation. Morphologic features of cirrhosis noted within the liver. Treated lesion within segment 2 is again noted. No signs of arterial phase enhancement on today's study. On the 45 second delayed image the treated lesion appears hypointense measuring 1.4 x 1.0 cm, image 35/15. This is compared with 1.5 x 1.3 cm previously. The second lesion of concern in segment 3 shows no arterial phase enhancement on today's study. On the delayed sequences a subtle area of hypointensity measures 1.2 x 0.8 cm, image 45/15. Previously this measured 1.7 x 1.1 cm. No new liver lesions identified.  No signs of restricted diffusion. Pancreas: No mass, inflammatory changes, or other parenchymal abnormality identified. Spleen: Lesion within the anterior spleen measures 1.4 cm, image 19/15. Unchanged from previous exam. Adrenals/Urinary Tract: Normal appearance of the adrenal glands. Small slightly exophytic lesion arising off the posterior cortex of the upper pole of left kidney measures 1.0 x 0.8 cm, image 36/15. This shows diffusely increased T1 signal compatible with a hemorrhagic cyst. Anterior cortex of left mid kidney cyst measures 0.7 cm, image 39/15. Stomach/Bowel: Visualized portions within the abdomen are unremarkable. Vascular/Lymphatic: Normal appearance of the abdominal aorta. No adenopathy. Other:  No free fluid or fluid collections. Musculoskeletal: No suspicious bone lesions identified. IMPRESSION: 1. Continued treatment response to lesions involving segment 2 and segment 3. No arterial phase enhancement associated with these lesions on today's exam to suggest viable tumor. 2. Stable appearance of small hemorrhagic  cyst arising off the posterior cortex of the upper pole of left kidney. 3. Stable splenic lesion. 4. Cholelithiasis. Electronically Signed   By: Kerby Moors M.D.   On: 04/14/2020 13:47   IR Radiologist Eval & Mgmt  Result Date: 01/30/2020 Please refer to notes tab for details about interventional procedure. (Op Note)     ASSESSMENT & PLAN:  1. Hepatocellular carcinoma ([place])   2. Elevated total protein   Cancer Staging Hepatocellular carcinoma ([place]) Staging form: Liver, AJCC 8th Edition - Clinical: Stage II (cT2, cN0, cM0) - Signed by Earlie Server, MD on 01/08/2020   # Stage II Hepatocellular carcinoma, , s/p Y90 hepatic radioembolization to hepatic segment 2 and 3 on 10/02/2019 Patient is clinically doing well. AFP has decreased to 12 at the last visit. Interval MRI liver with and without contrast was independently reviewed by me and discussed with patient Lesion in the hepatic subsegment II LR TR decreasing size-1.4 x 1 cm Lesion in the hepatic subsegment III LR TR decreased in size 1.2 x 0.8 cm cholelithiasis and chronic cirrhosis. No new liver lesions . Recommend continue image surveillance.  I will check LFT and AFP today.  #Increased total protein.  I will check protein electrophoresis at the next visit. # Thrombocytopenia, stable.   Follow up: 3 months.  All questions were answered. The patient knows to call the clinic with any problems questions or concerns. Earlie Server, MD, PhD Hematology Oncology [facility] Pager- [phone] 04/17/2020

## 2020-04-18 LAB — AFP TUMOR MARKER: AFP, Serum, Tumor Marker: 8.8 ng/mL — ABNORMAL HIGH (ref 0.0–8.3)

## 2020-04-24 ENCOUNTER — Other Ambulatory Visit: Payer: Medicare Other | Admitting: Primary Care

## 2020-04-24 ENCOUNTER — Other Ambulatory Visit: Payer: Self-pay

## 2020-04-25 ENCOUNTER — Other Ambulatory Visit: Payer: Self-pay

## 2020-04-25 ENCOUNTER — Other Ambulatory Visit: Payer: Medicare Other | Admitting: Primary Care

## 2020-04-29 ENCOUNTER — Ambulatory Visit: Payer: Medicare Other | Admitting: Internal Medicine

## 2020-04-29 DIAGNOSIS — E113293 Type 2 diabetes mellitus with mild nonproliferative diabetic retinopathy without macular edema, bilateral: Secondary | ICD-10-CM | POA: Diagnosis not present

## 2020-04-29 DIAGNOSIS — H401130 Primary open-angle glaucoma, bilateral, stage unspecified: Secondary | ICD-10-CM | POA: Diagnosis not present

## 2020-04-29 DIAGNOSIS — H40003 Preglaucoma, unspecified, bilateral: Secondary | ICD-10-CM | POA: Diagnosis not present

## 2020-04-30 ENCOUNTER — Other Ambulatory Visit: Payer: Self-pay

## 2020-04-30 ENCOUNTER — Encounter: Payer: Self-pay | Admitting: *Deleted

## 2020-04-30 ENCOUNTER — Ambulatory Visit
Admission: RE | Admit: 2020-04-30 | Discharge: 2020-04-30 | Disposition: A | Discharge status: Home / Self Care | Payer: Medicare Other | Source: Ambulatory Visit | Attending: Interventional Radiology | Admitting: Interventional Radiology

## 2020-04-30 ENCOUNTER — Ambulatory Visit (INDEPENDENT_AMBULATORY_CARE_PROVIDER_SITE_OTHER): Payer: Medicare Other | Admitting: Internal Medicine

## 2020-04-30 VITALS — BP 118/82 | HR 71 | Ht 66.0 in | Wt 163.0 lb

## 2020-04-30 DIAGNOSIS — I4819 Other persistent atrial fibrillation: Secondary | ICD-10-CM

## 2020-04-30 DIAGNOSIS — C220 Liver cell carcinoma: Secondary | ICD-10-CM

## 2020-04-30 DIAGNOSIS — K7460 Unspecified cirrhosis of liver: Secondary | ICD-10-CM | POA: Diagnosis not present

## 2020-04-30 DIAGNOSIS — I10 Essential (primary) hypertension: Secondary | ICD-10-CM

## 2020-04-30 DIAGNOSIS — I5032 Chronic diastolic (congestive) heart failure: Secondary | ICD-10-CM | POA: Diagnosis not present

## 2020-04-30 HISTORY — PX: IR RADIOLOGIST EVAL & MGMT: IMG5224

## 2020-04-30 NOTE — Progress Notes (Signed)
Follow-up Outpatient Visit Date: 04/30/2020  Primary Care Provider: Ronnell Freshwater, NP [address] [place] [address]  Chief Complaint: Follow-up atrial fibrillation  HPI:  Ms. Peplinski is a 65 y.o. 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  She was last seen in our office in 10/2019, having been diagnosed with hepatocellular carcinoma earlier in the year.  She underwent transarterial radioembolization in 09/2019.  At her last visit, she was feeling relatively well and was back in sinus rhythm.  Diltiazem was decreased to 120 mg daily.  She had been on apixaban 5 mg twice daily, though it appears that the dose was switched to 2.5 mg twice daily this fall for unclear reasons.  Today, Ms. Caputo reports that she has been doing well other than some elevated blood sugars.  She is back on long-acting insulin and notes that her blood sugars have improved.  She occasionally feels a little bit tremulous but otherwise is doing okay.  She has not had any chest pain, shortness of breath, palpitations, lightheadedness, edema, or bleeding.  --------------------------------------------------------------------------------------------------  Past Medical History:  Diagnosis Date  . B12 deficiency 04/05/2017  . Blood in stool   . Cerebral aneurysm   . Chronic kidney disease   . Diabetes mellitus without complication ([place])   . Gastric erosion with bleeding   . Hyperlipidemia   . Hypertension   . Lower extremity cellulitis 02/08/2019  . Seizure disorder [facility])    Past Surgical History:  Procedure Laterality Date  . CEREBRAL ANEURYSM REPAIR  1991  . COLONOSCOPY WITH PROPOFOL N/A 02/12/2019   Procedure: COLONOSCOPY WITH  PROPOFOL;  Surgeon: Lin Landsman, MD;  Location: [facility] ENDOSCOPY;  Service: Gastroenterology;  Laterality: N/A;  . ESOPHAGOGASTRODUODENOSCOPY (EGD) WITH PROPOFOL N/A 02/12/2019   Procedure: ESOPHAGOGASTRODUODENOSCOPY (EGD) WITH PROPOFOL;  Surgeon: Lin Landsman, MD;  Location: [facility] ENDOSCOPY;  Service: Gastroenterology;  Laterality: N/A;  . IR ANGIOGRAM SELECTIVE EACH ADDITIONAL VESSEL  09/21/2019  . IR ANGIOGRAM SELECTIVE EACH ADDITIONAL VESSEL  09/21/2019  . IR ANGIOGRAM SELECTIVE EACH ADDITIONAL VESSEL  09/21/2019  . IR ANGIOGRAM SELECTIVE EACH ADDITIONAL VESSEL  09/21/2019  . IR ANGIOGRAM SELECTIVE EACH ADDITIONAL VESSEL  10/02/2019  . IR ANGIOGRAM VISCERAL SELECTIVE  09/21/2019  . IR ANGIOGRAM VISCERAL SELECTIVE  09/21/2019  . IR ANGIOGRAM VISCERAL SELECTIVE  10/02/2019  . IR EMBO TUMOR ORGAN ISCHEMIA INFARCT INC GUIDE ROADMAPPING  10/02/2019  . IR EMBO TUMOR ORGAN ISCHEMIA INFARCT INC GUIDE ROADMAPPING  10/02/2019  . IR RADIOLOGIST EVAL & MGMT  08/28/2019  . IR RADIOLOGIST EVAL & MGMT  01/30/2020  . IR RADIOLOGIST EVAL & MGMT  04/30/2020  . IR US GUIDE VASC ACCESS LEFT  09/21/2019  . IR US GUIDE VASC ACCESS LEFT  10/02/2019    Current Meds  Medication Sig  . allopurinol (ZYLOPRIM) 100 MG tablet TAKE 1 TABLET BY MOUTH EVERY DAY  . ELIQUIS 2.5 MG TABS tablet TAKE 1 TABLET BY MOUTH TWICE A DAY  . escitalopram (LEXAPRO) 10 MG tablet TAKE 1 TABLET (10 MG TOTAL) BY MOUTH DAILY. FOR ANXIETY/SLEEP  . furosemide (LASIX) 20 MG tablet TAKE 1 TABLET BY MOUTH EVERY DAY  . glucose blood (ACCU-CHEK GUIDE) test strip Use as instructed once a daily DX e11.65  . insulin glargine, 1 Unit Dial, (TOUJEO SOLOSTAR)  300 UNIT/ML Solostar Pen Use 35 units sq in the evening with food  . Insulin Pen Needle 32G X 4 MM MISC Use as directed  With toujeo  . metoprolol tartrate (LOPRESSOR) 25 MG tablet TAKE 1/2 TABLET BY MOUTH TWICE A DAY  . mirtazapine (REMERON) 7.5 MG tablet Take 7.5 mg by mouth at bedtime.  . Safety  Lancets 28G MISC Use as directed once a daily Dx e11.65  . spironolactone (ALDACTONE) 25 MG tablet Take 1 tablet (25 mg total) by mouth daily. For cirrhosis of liver  . traZODone (DESYREL) 50 MG tablet Take 0.5-1 tablets (25-50 mg total) by mouth at bedtime as needed for sleep.    Allergies: Aspirin  Social History   Tobacco Use  . Smoking status: Never Smoker  . Smokeless tobacco: Current User    Types: Snuff  Vaping Use  . Vaping Use: Never used  Substance Use Topics  . Alcohol use: Not Currently    Alcohol/week: 14.0 standard drinks    Types: 14 Cans of beer per week    Comment: patient quit in october 2020  . Drug use: No    Family History  Problem Relation Age of Onset  . Heart attack Brother 24    Review of Systems: A 12-system review of systems was performed and was negative except as noted in the HPI.  --------------------------------------------------------------------------------------------------  Physical Exam: BP 118/82   Pulse 71   Ht 5\' 6"  (1.676 m)   Wt 163 lb (73.9 kg)   BMI 26.31 kg/m   General:  NAD. Neck: No JVD or HJR. Lungs: Clear to auscultation bilaterally without wheezes or crackles. Heart: Regular rate and rhythm without murmurs, rubs, or gallops. Abdomen: Soft, nontender, nondistended. Extremities: No lower extremity edema.  EKG: Normal sinus rhythm with borderline LVH.  Heart rate has increased from prior tracing on 10/29/2019.  Otherwise, there has been no significant interval change.  Lab Results  Component Value Date   WBC 5.3 01/03/2020   HGB 13.2 01/03/2020   HCT 39.1 01/03/2020   MCV 90.7 01/03/2020   PLT 139 (L) 01/03/2020    Lab Results  Component Value Date   NA 141 01/03/2020   K 4.3 01/03/2020   CL 98 01/03/2020   CO2 33 (H) 01/03/2020   BUN 17 01/03/2020   CREATININE 1.27 (H) 01/03/2020   GLUCOSE 298 (H) 01/03/2020   ALT 19 04/17/2020    Lab Results  Component Value Date   CHOL 134 01/27/2018   HDL 80  01/27/2018   LDLCALC 42 01/27/2018   TRIG 60 01/27/2018    --------------------------------------------------------------------------------------------------  ASSESSMENT AND PLAN: Persistent atrial fibrillation: Ms. Reyman is in sinus rhythm today and does not have any symptoms to suggest recurrent atrial fibrillation.  Given a CHA2DS2-VASc score of at least 4, she has been maintained on anticoagulation following clearance by GI last year.  At some point this fall, it looks like her apixaban was switched to 2.5 mg daily for uncertain reasons.  I will check a CBC and BMP today.  She would likely qualify for going back on 5 mg twice daily based on age and weight alone, and certainly if her creatinine is less than 1.5.  We will reach out to her with further instructions after reviewing the results of this lab work  Cirrhosis and hepatocellular carcinoma: No evidence of significant volume overload or reports of bleeding.  Continue current regimen of furosemide and spironolactone.  I will check a BMP and CBC today.  Continue follow-up with oncology and GI.  Chronic HFpEF: Ms. Cunanan appears euvolemic with NYHA class I-II symptoms.  Continue current diuretic regimen with BMP today.  Follow-up: Return to clinic in 6 months.  Nelva Bush, MD 04/30/2020 1:40 PM

## 2020-04-30 NOTE — Progress Notes (Signed)
Chief Complaint: Patient was consulted remotely today (TeleHealth) for hepatocellular cancer at the request of Marquiz Sotelo K.    Referring Physician(s): Dr. Earlie Server  History of Present Illness: Adriana Miller is a 65 y.o. 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  She presented with a 2.0 cm segment II and 1.7 cm segment III mass and underwent split dose radiation segmentectomy on 10/02/19.    Her recovery was uneventful.  Initial post therapy liver MRI dated 01/03/2020 demonstrated an excellent response to therapy with no evidence of residual disease in the segment 2 lesion and equivocal residual enhancement in the segment 3 lesion.  MRI dated 04/14/2020 demonstrates continued treatment response to lesions in both the segment 2 and segment 3 with no evidence of residual viable tumor.  No new hepatic lesions are identified.  [name] reports that she is doing well and has no active complaints.  Past Medical History:  Diagnosis Date  . B12 deficiency 04/05/2017  . Blood in stool   . Cerebral aneurysm   . Chronic kidney disease   . Diabetes mellitus without complication ([place])   . Gastric erosion with bleeding   . Hyperlipidemia   . Hypertension   . Lower extremity cellulitis 02/08/2019  . Seizure disorder [facility])     Past Surgical History:  Procedure Laterality Date  . CEREBRAL ANEURYSM REPAIR  1991  . COLONOSCOPY WITH PROPOFOL N/A 02/12/2019   Procedure: COLONOSCOPY WITH PROPOFOL;  Surgeon: Lin Landsman, MD;  Location: [facility] ENDOSCOPY;  Service: Gastroenterology;  Laterality: N/A;  . ESOPHAGOGASTRODUODENOSCOPY (EGD) WITH PROPOFOL N/A 02/12/2019   Procedure: ESOPHAGOGASTRODUODENOSCOPY (EGD) WITH PROPOFOL;  Surgeon: Lin Landsman, MD;  Location: [facility] ENDOSCOPY;  Service: Gastroenterology;  Laterality: N/A;  . IR ANGIOGRAM SELECTIVE EACH ADDITIONAL VESSEL  09/21/2019  . IR ANGIOGRAM SELECTIVE EACH  ADDITIONAL VESSEL  09/21/2019  . IR ANGIOGRAM SELECTIVE EACH ADDITIONAL VESSEL  09/21/2019  . IR ANGIOGRAM SELECTIVE EACH ADDITIONAL VESSEL  09/21/2019  . IR ANGIOGRAM SELECTIVE EACH ADDITIONAL VESSEL  10/02/2019  . IR ANGIOGRAM VISCERAL SELECTIVE  09/21/2019  . IR ANGIOGRAM VISCERAL SELECTIVE  09/21/2019  . IR ANGIOGRAM VISCERAL SELECTIVE  10/02/2019  . IR EMBO TUMOR ORGAN ISCHEMIA INFARCT INC GUIDE ROADMAPPING  10/02/2019  . IR EMBO TUMOR ORGAN ISCHEMIA INFARCT INC GUIDE ROADMAPPING  10/02/2019  . IR RADIOLOGIST EVAL & MGMT  08/28/2019  . IR RADIOLOGIST EVAL & MGMT  01/30/2020  . IR US GUIDE VASC ACCESS LEFT  09/21/2019  . IR US GUIDE VASC ACCESS LEFT  10/02/2019    Allergies: Aspirin  Medications: Prior to Admission medications   Medication Sig Start Date End Date Taking? Authorizing Provider  allopurinol (ZYLOPRIM) 100 MG tablet TAKE 1 TABLET BY MOUTH EVERY DAY 01/25/20   Kendell Bane, NP  ELIQUIS 2.5 MG TABS tablet TAKE 1 TABLET BY MOUTH TWICE A DAY 02/06/20   Lavera Guise, MD  escitalopram (LEXAPRO) 10 MG tablet TAKE 1 TABLET (10 MG TOTAL) BY MOUTH DAILY. FOR ANXIETY/SLEEP 12/10/19   Lavera Guise, MD  furosemide (LASIX) 20 MG tablet TAKE 1 TABLET BY MOUTH EVERY DAY 04/14/20   End, Harrell Gave, MD  glucose blood (ACCU-CHEK GUIDE) test strip Use as instructed once a daily DX e11.65 02/04/20   Luiz Ochoa, NP  insulin glargine, 1 Unit Dial, (TOUJEO SOLOSTAR) 300 UNIT/ML Solostar Pen Use 35 units sq in the evening with food 04/01/20   Ronnell Freshwater, NP  Insulin Pen  Needle 32G X 4 MM MISC Use as directed  With toujeo 03/04/20   Lavera Guise, MD  metoprolol tartrate (LOPRESSOR) 25 MG tablet TAKE 1/2 TABLET BY MOUTH TWICE A DAY 01/14/20   Marrianne Mood D, PA-C  mirtazapine (REMERON) 7.5 MG tablet Take 7.5 mg by mouth at bedtime. 04/22/20   [provider]  Safety Lancets 28G MISC Use as directed once a daily Dx e11.65 02/04/20   Luiz Ochoa, NP  spironolactone (ALDACTONE) 25  MG tablet Take 1 tablet (25 mg total) by mouth daily. For cirrhosis of liver 06/19/19   Kendell Bane, NP  traZODone (DESYREL) 50 MG tablet Take 0.5-1 tablets (25-50 mg total) by mouth at bedtime as needed for sleep. 09/05/19   Borders, Kirt Boys, NP     Family History  Problem Relation Age of Onset  . Heart attack Brother 14    Social History   Socioeconomic History  . Marital status: Divorced    Spouse name: Not on file  . Number of children: Not on file  . Years of education: Not on file  . Highest education level: Not on file  Occupational History  . Not on file  Tobacco Use  . Smoking status: Never Smoker  . Smokeless tobacco: Current User    Types: Snuff  Vaping Use  . Vaping Use: Never used  Substance and Sexual Activity  . Alcohol use: Not Currently    Alcohol/week: 14.0 standard drinks    Types: 14 Cans of beer per week    Comment: patient quit in october 2020  . Drug use: No  . Sexual activity: Not Currently  Other Topics Concern  . Not on file  Social History Narrative  . Not on file   Social Determinants of Health   Financial Resource Strain: Not on file  Food Insecurity: Not on file  Transportation Needs: Not on file  Physical Activity: Not on file  Stress: Not on file  Social Connections: Not on file    Review of Systems  Review of Systems: A 12 point ROS discussed and pertinent positives are indicated in the HPI above.  All other systems are negative.  Physical Exam No direct physical exam was performed (except for noted visual exam findings with Video Visits).   Vital Signs: There were no vitals taken for this visit.  Imaging: MR LIVER W WO CONTRAST  Result Date: 04/14/2020 CLINICAL DATA:  Follow-up hepatocellular carcinoma. EXAM: MRI ABDOMEN WITHOUT AND WITH CONTRAST TECHNIQUE: Multiplanar multisequence MR imaging of the abdomen was performed both before and after the administration of intravenous contrast. CONTRAST:  7.66mL GADAVIST  GADOBUTROL 1 MMOL/ML IV SOLN COMPARISON:  01/03/2020 FINDINGS: Lower chest: No acute findings. Hepatobiliary: Tiny stones noted within the gallbladder. No signs of gallbladder wall inflammation or bile duct dilatation. Morphologic features of cirrhosis noted within the liver. Treated lesion within segment 2 is again noted. No signs of arterial phase enhancement on today's study. On the 45 second delayed image the treated lesion appears hypointense measuring 1.4 x 1.0 cm, image 35/15. This is compared with 1.5 x 1.3 cm previously. The second lesion of concern in segment 3 shows no arterial phase enhancement on today's study. On the delayed sequences a subtle area of hypointensity measures 1.2 x 0.8 cm, image 45/15. Previously this measured 1.7 x 1.1 cm. No new liver lesions identified.  No signs of restricted diffusion. Pancreas: No mass, inflammatory changes, or other parenchymal abnormality identified. Spleen: Lesion within the anterior  spleen measures 1.4 cm, image 19/15. Unchanged from previous exam. Adrenals/Urinary Tract: Normal appearance of the adrenal glands. Small slightly exophytic lesion arising off the posterior cortex of the upper pole of left kidney measures 1.0 x 0.8 cm, image 36/15. This shows diffusely increased T1 signal compatible with a hemorrhagic cyst. Anterior cortex of left mid kidney cyst measures 0.7 cm, image 39/15. Stomach/Bowel: Visualized portions within the abdomen are unremarkable. Vascular/Lymphatic: Normal appearance of the abdominal aorta. No adenopathy. Other:  No free fluid or fluid collections. Musculoskeletal: No suspicious bone lesions identified. IMPRESSION: 1. Continued treatment response to lesions involving segment 2 and segment 3. No arterial phase enhancement associated with these lesions on today's exam to suggest viable tumor. 2. Stable appearance of small hemorrhagic cyst arising off the posterior cortex of the upper pole of left kidney. 3. Stable splenic lesion. 4.  Cholelithiasis. Electronically Signed   By: Kerby Moors M.D.   On: 04/14/2020 13:47    Labs:  CBC: Recent Labs    09/21/19 0900 10/02/19 0840 11/22/19 1308 01/03/20 1015  WBC 7.2 6.1 6.0 5.3  HGB 12.3 12.0 12.8 13.2  HCT 37.1 37.5 38.8 39.1  PLT 176 151 139* 139*    COAGS: Recent Labs    09/21/19 0900 10/02/19 0840  INR 1.1 1.2    BMP: Recent Labs    09/21/19 0900 10/02/19 0840 11/22/19 1308 01/03/20 1015  NA 139 141 137 141  K 4.0 3.8 3.4* 4.3  CL 102 103 99 98  CO2 25 27 26  33*  GLUCOSE 126* 147* 195* 298*  BUN 25* 20 24* 17  CALCIUM 9.5 9.4 9.1 9.6  CREATININE 1.00 1.19* 1.26* 1.27*  GFRNONAA 59* 48* 44* 44*  GFRAA >60 55* 51* 51*    LIVER FUNCTION TESTS: Recent Labs    10/02/19 0840 11/22/19 1308 01/03/20 1015 04/17/20 1108  BILITOT 0.6 0.6 1.0 0.7  AST 23 24 22 29   ALT 15 13 15 19   ALKPHOS 89 102 96 97  PROT 8.7* 8.7* 8.9* 8.5*  ALBUMIN 3.6 3.7 3.6 3.5    TUMOR MARKERS: 04/17/20: AFP 8.8  Assessment and Plan:  Clinically doing very well 6 months status post radiation segmentectomy of hepatic segments 2 and 3.  Follow-up MRI demonstrates no evidence of residual or recurrent disease.  1.)  Follow-up MRI of the abdomen with gadolinium contrast and accompanying clinic visit in 3 months.    Electronically Signed: Criselda Peaches 04/30/2020, 10:54 AM   I spent a total of 15 Minutes in remote  clinical consultation, greater than 50% of which was counseling/coordinating care for hepatocellular cancer.    Visit type: Audio only (telephone). Audio (no video) only due to patient preference. Alternative for in-person consultation at [facility], [address] [place], [place], [place]. This visit type was conducted due to national recommendations for restrictions regarding the COVID-19 Pandemic (e.g. social distancing).  This format is felt to be most appropriate for this patient at this time.  All issues noted in this document were  discussed and addressed.

## 2020-04-30 NOTE — Patient Instructions (Addendum)
Medication Instructions:  Your physician recommends that you continue on your current medications as directed. Please refer to the Current Medication list given to you today.  *If you need a refill on your cardiac medications before your next appointment, please call your pharmacy*   Lab Work: Your physician recommends that you return for lab work in: TODAY - [place], BMP.  If you have labs (blood work) drawn today and your tests are completely normal, you will receive your results only by: Marland Kitchen MyChart Message (if you have MyChart) OR . A paper copy in the mail If you have any lab test that is abnormal or we need to change your treatment, we will call you to review the results.   Testing/Procedures: none  Follow-Up: At [facility], you and your health needs are our priority.  As part of our continuing mission to provide you with exceptional heart care, we have created designated Provider Care Teams.  These Care Teams include your primary Cardiologist (physician) and Advanced Practice Providers (APPs -  Physician Assistants and Nurse Practitioners) who all work together to provide you with the care you need, when you need it.  We recommend signing up for the patient portal called "MyChart".  Sign up information is provided on this After Visit Summary.  MyChart is used to connect with patients for Virtual Visits (Telemedicine).  Patients are able to view lab/test results, encounter notes, upcoming appointments, etc.  Non-urgent messages can be sent to your provider as well.   To learn more about what you can do with MyChart, go to [email or web address].    Your next appointment:   6 month(s)  The format for your next appointment:   In Person  Provider:   You may see Nelva Bush, MD or one of the following Advanced Practice Providers on your designated Care Team:    Murray Hodgkins, NP  Christell Faith, PA-C  Marrianne Mood, PA-C  [place]ence [place], [place]  Laurann [place], NP

## 2020-05-01 ENCOUNTER — Encounter: Payer: Self-pay | Admitting: *Deleted

## 2020-05-01 ENCOUNTER — Telehealth: Payer: Self-pay | Admitting: *Deleted

## 2020-05-01 LAB — BASIC METABOLIC PANEL
BUN/Creatinine Ratio: 12 (ref 12–28)
BUN: 15 mg/dL (ref 8–27)
CO2: 23 mmol/L (ref 20–29)
Calcium: 8.8 mg/dL (ref 8.7–10.3)
Chloride: 97 mmol/L (ref 96–106)
Creatinine, Ser: 1.22 mg/dL — ABNORMAL HIGH (ref 0.57–1.00)
GFR calc Af Amer: 53 mL/min/{1.73_m2} — ABNORMAL LOW (ref >59)
GFR calc non Af Amer: 46 mL/min/{1.73_m2} — ABNORMAL LOW (ref >59)
Glucose: 271 mg/dL — ABNORMAL HIGH (ref 65–99)
Potassium: 4.2 mmol/L (ref 3.5–5.2)
Sodium: 136 mmol/L (ref 134–144)

## 2020-05-01 LAB — CBC
Hematocrit: 39 % (ref 34.0–46.6)
Hemoglobin: 13.4 g/dL (ref 11.1–15.9)
MCH: 30.5 pg (ref 26.6–33.0)
MCHC: 34.4 g/dL (ref 31.5–35.7)
MCV: 89 fL (ref 79–97)
Platelets: 175 10*3/uL (ref 150–450)
RBC: 4.4 x10E6/uL (ref 3.77–5.28)
RDW: 11.6 % — ABNORMAL LOW (ref 11.7–15.4)
WBC: 8.7 10*3/uL (ref 3.4–10.8)

## 2020-05-01 NOTE — Telephone Encounter (Signed)
-----   Message from Nelva Bush, MD sent at 05/01/2020  6:44 AM EST ----- Labs are stable, though poorly controlled blood sugar is noted.  Continue current medications and follow-up as discussed at yesterday's office visit.  She should also reach out to her PCP regarding further optimization of her diabetes medications.

## 2020-05-01 NOTE — Telephone Encounter (Signed)
No answer. Left detailed message with results, ok per DPR, and to call back if any questions.  Letter with results mailed to patient. 

## 2020-05-06 ENCOUNTER — Ambulatory Visit: Payer: Medicare Other | Admitting: Internal Medicine

## 2020-05-06 ENCOUNTER — Other Ambulatory Visit: Payer: Self-pay

## 2020-05-06 ENCOUNTER — Other Ambulatory Visit: Payer: Medicare Other | Admitting: Primary Care

## 2020-05-07 ENCOUNTER — Other Ambulatory Visit: Payer: Medicare Other | Admitting: Primary Care

## 2020-05-07 ENCOUNTER — Other Ambulatory Visit: Payer: Self-pay

## 2020-05-07 DIAGNOSIS — Z515 Encounter for palliative care: Secondary | ICD-10-CM

## 2020-05-07 DIAGNOSIS — I5032 Chronic diastolic (congestive) heart failure: Secondary | ICD-10-CM | POA: Diagnosis not present

## 2020-05-07 DIAGNOSIS — C220 Liver cell carcinoma: Secondary | ICD-10-CM

## 2020-05-07 DIAGNOSIS — E1165 Type 2 diabetes mellitus with hyperglycemia: Secondary | ICD-10-CM | POA: Diagnosis not present

## 2020-05-07 DIAGNOSIS — K7030 Alcoholic cirrhosis of liver without ascites: Secondary | ICD-10-CM

## 2020-05-07 NOTE — Progress Notes (Signed)
[place] Consult Note Telephone: [phone]  Fax: [phone]     Date of encounter: 05/07/20 PATIENT NAME: Adriana Miller [address] [place] [address] [phone] (home)  DOB: Dec 13, 1955 MRN: [id number]  PRIMARY CARE PROVIDER:    Ronnell Freshwater, NP,  [address] [place] [address] [phone]  REFERRING PROVIDER: Irean Hong, NP [place],  [place] [address] [phone]  RESPONSIBLE PARTY:   Extended Emergency Contact Information Primary Emergency Contact: [place] Phone: [phone] Mobile Phone: [phone] Relation: Brother  I met face to face with patient in  home. Palliative Care was asked to follow this patient by consultation request of Borders, Kirt Boys, NP  to help address advance care planning and goals of care. This is a follow up   visit.   ASSESSMENT AND RECOMMENDATIONS:   1. Advance Care Planning/Goals of Care: Goals include to maximize quality of life and symptom management. Reviewed advance care plan, she states there are no changes. She will show her granddaughter where these papers are kept.  2. Symptom Management:  I met with patient at her home. She stated her caregiver had just left. She appears comfortable and in no distress.   Glucose management: She states that she has recently started on insulin. She acknowledges that she had taken insulin in the past and did blood sugar checks. For some reason those had stopped but now have resumed. Instead of glucose in the 400-to 500s they are  more in the 100 to 200s, occasional 300s. She said she does not have any trouble with checking sugar or giving insulin. This better control should help with some weight gain and energy.  Poly pharmacy: We reviewed her medicines. She declines to change  pharmacies so she can avail herself of a prepackaged medication program. She does not read labels and relies on  the appearance of the medication. I feel a safer way would be pre-package dispensing but she stated she is loyal to her retail chain pharmacy.   Mood: She stated that she sometimes feels isolated, as she lives on the back end of her apartment complex. She states sometimes she's afraid of being by herself. She does get frequent visits from her brother, her caregiver, and her granddaughter has started staying over on the weekends. She is enjoying this quite a bit. She states that her mood is good and she feels tired a lot but that she is not concerned about self -caregiver strain. She states a recent scan revealed no increased cancer disease. She was very positive about this.   Mobility appears good. She has a little trouble rising from a low seat but is able to ambulate reliably in her home.   Nutrition: She does get a meal service on the weekends from a church and declines any service that brings meals that have to be reheated. She stated she felt she has lost some weight unintentionally  and does not want to gain it back.   3. Follow up Palliative Care Visit: Palliative care will continue to follow for goals of care clarification and symptom management. Return 6-8 weeks or prn.  4. Family /Caregiver/Community Supports: Brother is POA, granddaughter is in [place]. Has meal delivery from non profit and CAP services  5. Cognitive / Functional decline: A and O x 3, able to ambulate, do most adls, needs assistance with some iadls.  I spent 40 minutes providing this consultation,  from 1300  to 1340. More than 50% of the time in this consultation was spent coordinating communication.   CODE STATUS: FULL  PPS: 50%  HOSPICE ELIGIBILITY/DIAGNOSIS: TBD  Subjective:  CHIEF COMPLAINT: debility  HISTORY OF PRESENT ILLNESS:  Adriana Miller is a 65 y.o. year old female  with Cirrhosis, DM, hepatocellular cancer, HFpEF, self care deficits .   We are asked to consult around advance care planning and  complex medical decision making.    Review and summarization of old Epic records shows or history from other than patient. Review or lab tests, radiology,  or medicine Recent bg levels.Reviewed CT of abdomen of 04/14/20.  History obtained from review of EMR, discussion with primary team, and  interview with family, caregiver  and/or Ms. Myhand. Records reviewed and summarized above.   CURRENT PROBLEM LIST:  Patient Active Problem List   Diagnosis Date Noted  . Hepatic cirrhosis ([place]) 04/30/2020  . Hepatocellular carcinoma ([place]) 01/08/2020  . Left arm weakness 07/25/2019  . Atrial fibrillation with RVR ([place]) 07/25/2019  . Hypotension 04/20/2019  . Alcoholic cirrhosis of liver without ascites ([place])   . Chronic heart failure with preserved ejection fraction (HFpEF) ([place]) 01/15/2019  . QT prolongation 01/15/2019  . Dyspnea on exertion 10/10/2018  . Persistent atrial fibrillation ([place]) 10/10/2018  . Diastolic dysfunction 75/01/2584  . Acute upper respiratory infection 09/07/2017  . Essential hypertension 09/07/2017  . Type 2 diabetes mellitus, uncontrolled ([place]) 04/05/2017  . Weakness 04/05/2017  . Mixed hyperlipidemia 04/05/2017  . Alcohol abuse with other alcohol-induced disorder ([place]) 04/05/2017  . B12 deficiency 04/05/2017  . History of seizure 05/20/2015  . Brain aneurysm 05/20/2015   PAST MEDICAL HISTORY:  Active Ambulatory Problems    Diagnosis Date Noted  . History of seizure 05/20/2015  . Brain aneurysm 05/20/2015  . Type 2 diabetes mellitus, uncontrolled ([place]) 04/05/2017  . Weakness 04/05/2017  . Mixed hyperlipidemia 04/05/2017  . Alcohol abuse with other alcohol-induced disorder ([place]) 04/05/2017  . B12 deficiency 04/05/2017  . Acute upper respiratory infection 09/07/2017  . Essential hypertension 09/07/2017  . Dyspnea on exertion 10/10/2018  . Persistent atrial fibrillation ([place]) 10/10/2018  . Diastolic dysfunction 27/78/2423  . Chronic heart failure with  preserved ejection fraction (HFpEF) ([place]) 01/15/2019  . QT prolongation 01/15/2019  . Alcoholic cirrhosis of liver without ascites ([place])   . Hypotension 04/20/2019  . Left arm weakness 07/25/2019  . Atrial fibrillation with RVR ([place]) 07/25/2019  . Hepatocellular carcinoma ([place]) 01/08/2020  . Hepatic cirrhosis ([place]) 04/30/2020   Resolved Ambulatory Problems    Diagnosis Date Noted  . Lower extremity cellulitis 02/08/2019  . Blood in stool   . Gastric erosion with bleeding    Past Medical History:  Diagnosis Date  . Cerebral aneurysm   . Chronic kidney disease   . Diabetes mellitus without complication ([place])   . Hyperlipidemia   . Hypertension   . Seizure disorder [facility])    SOCIAL HX:  Social History   Tobacco Use  . Smoking status: Never Smoker  . Smokeless tobacco: Current User    Types: Snuff  Substance Use Topics  . Alcohol use: Not Currently    Alcohol/week: 14.0 standard drinks    Types: 14 Cans of beer per week    Comment: patient quit in october 2020   FAMILY HX:  Family History  Problem Relation Age of Onset  . Heart attack Brother 62      ALLERGIES:  Allergies  Allergen Reactions  . Aspirin Other (See Comments)  Nose bleed     PERTINENT MEDICATIONS:  Outpatient Encounter Medications as of 05/07/2020  Medication Sig  . allopurinol (ZYLOPRIM) 100 MG tablet TAKE 1 TABLET BY MOUTH EVERY DAY  . ELIQUIS 2.5 MG TABS tablet TAKE 1 TABLET BY MOUTH TWICE A DAY  . escitalopram (LEXAPRO) 10 MG tablet TAKE 1 TABLET (10 MG TOTAL) BY MOUTH DAILY. FOR ANXIETY/SLEEP  . furosemide (LASIX) 20 MG tablet TAKE 1 TABLET BY MOUTH EVERY DAY  . glucose blood (ACCU-CHEK GUIDE) test strip Use as instructed once a daily DX e11.65  . insulin glargine, 1 Unit Dial, (TOUJEO SOLOSTAR) 300 UNIT/ML Solostar Pen Use 35 units sq in the evening with food  . Insulin Pen Needle 32G X 4 MM MISC Use as directed  With toujeo  . metoprolol tartrate (LOPRESSOR) 25 MG tablet TAKE 1/2 TABLET BY  MOUTH TWICE A DAY  . mirtazapine (REMERON) 7.5 MG tablet Take 7.5 mg by mouth at bedtime.  . Safety Lancets 28G MISC Use as directed once a daily Dx e11.65  . spironolactone (ALDACTONE) 25 MG tablet Take 1 tablet (25 mg total) by mouth daily. For cirrhosis of liver  . traZODone (DESYREL) 50 MG tablet Take 0.5-1 tablets (25-50 mg total) by mouth at bedtime as needed for sleep.   No facility-administered encounter medications on file as of 05/07/2020.    Objective: ROS . General: NAD EYES: denies vision changes ENMT: denies dysphagia Cardiovascular: denies chest pain Pulmonary: denies  cough, denies increased SOB Abdomen: endorses good appetite, denies  constipation, endorses continence of bowel GU: denies dysuria, endorses continence of urine MSK:  endorses ROM limitations, no falls reported Skin: denies rashes or wounds Neurological: endorses baseline weakness, denies pain, denies insomnia Psych: Endorses positive mood Heme/lymph/immuno: denies bruises, abnormal bleeding  Physical Exam: Current and past weights:163 lbs, appears stable +/- 5 lbs from the epic record Constitutional:  NAD General: frail appearing,WNWD EYES: anicteric sclera,lids intact, no discharge  ENMT: intact hearing,oral mucous membranes moist, dentition poor condition CV:  no LE edema Pulmonary:no increased work of breathing, no cough, no audible wheezes, room air Abdomen: intake 50-75%, no ascites GU: deferred MSK: moderate sarcopenia, decreased ROM in all extremities, ambulatory Skin: warm and dry, no rashes or wounds on visible skin Neuro: Generalized weakness, no cognitive impairment, reduced health literacy, Psych: non-anxious affect, A and O x 3 Hem/lymph/immuno: no widespread bruising   Thank you for the opportunity to participate in the care of Ms. Dillehay.  The palliative care team will continue to follow. Please call our office at [phone] if we can be of additional assistance.  Jason Coop, NP , DNP, MPH, AGPCNP-BC, ACHPN  COVID-19 PATIENT SCREENING TOOL  Person answering questions: ____________self______ _____   1.  Is the patient or any family member in the home showing any signs or symptoms regarding respiratory infection?               Person with Symptom- __________NA_________________  a. Fever                                                                          Yes___ No___          ___________________  b. Shortness of breath  Yes___ No___          ___________________ c. Cough/congestion                                       Yes___  No___         ___________________ d. Body aches/pains                                                         Yes___ No___        ____________________ e. Gastrointestinal symptoms (diarrhea, nausea)           Yes___ No___        ____________________  2. Within the past 14 days, has anyone living in the home had any contact with someone with or under investigation for COVID-19?    Yes___ No_X_   Person __________________   

## 2020-05-08 ENCOUNTER — Other Ambulatory Visit: Payer: Self-pay

## 2020-05-08 ENCOUNTER — Encounter: Payer: Self-pay | Admitting: Physician Assistant

## 2020-05-08 ENCOUNTER — Ambulatory Visit (INDEPENDENT_AMBULATORY_CARE_PROVIDER_SITE_OTHER): Payer: Medicare Other | Admitting: Physician Assistant

## 2020-05-08 VITALS — BP 158/64 | HR 70 | Temp 97.3°F | Resp 16 | Ht 66.0 in | Wt 168.0 lb

## 2020-05-08 DIAGNOSIS — E538 Deficiency of other specified B group vitamins: Secondary | ICD-10-CM | POA: Diagnosis not present

## 2020-05-08 DIAGNOSIS — IMO0002 Reserved for concepts with insufficient information to code with codable children: Secondary | ICD-10-CM

## 2020-05-08 DIAGNOSIS — Z0001 Encounter for general adult medical examination with abnormal findings: Secondary | ICD-10-CM

## 2020-05-08 DIAGNOSIS — E1165 Type 2 diabetes mellitus with hyperglycemia: Secondary | ICD-10-CM

## 2020-05-08 DIAGNOSIS — E1129 Type 2 diabetes mellitus with other diabetic kidney complication: Secondary | ICD-10-CM | POA: Diagnosis not present

## 2020-05-08 DIAGNOSIS — I10 Essential (primary) hypertension: Secondary | ICD-10-CM

## 2020-05-08 LAB — POCT GLYCOSYLATED HEMOGLOBIN (HGB A1C): Hemoglobin A1C: 9.5 % — AB (ref 4.0–5.6)

## 2020-05-08 MED ORDER — CYANOCOBALAMIN 1000 MCG/ML IJ SOLN
1000.0000 ug | Freq: Once | INTRAMUSCULAR | Status: AC
  Administered 2020-05-08: 1000 ug via INTRAMUSCULAR

## 2020-05-08 NOTE — Progress Notes (Signed)
[facility] [place], [place] [address]  Internal MEDICINE  Office Visit Note  Patient Name: Adriana Miller  [id number]  [id number]  Date of Service: 05/08/2020  Chief Complaint  Patient presents with  . Medicare Wellness  . Hyperlipidemia  . Hypertension  . Diabetes     HPI Pt is here for routine health maintenance examination. Doing well. States she feels great. Blood sugars: 140-350. Tolerating them well. A1c is 9.5 today. Gave Trulicity injection in office today and given sample to use in 1 wk. F/u in 2 weeks. Cardiologist last week. Started on  Mirtazapine at night and is unclear why she is taking this. Does think maybe it was for her sleep. Pt is updated as follows for Preventive Health Maintenance (PHM) Mammogram last done Nov 2020. Needs repeat in 1 yr. Colonoscopy up to date. BMD needs to be done. Eye exam last week. Cannot give urine sample at this time.  Current Medication: Outpatient Encounter Medications as of 05/08/2020  Medication Sig  . allopurinol (ZYLOPRIM) 100 MG tablet TAKE 1 TABLET BY MOUTH EVERY DAY  . ELIQUIS 2.5 MG TABS tablet TAKE 1 TABLET BY MOUTH TWICE A DAY  . escitalopram (LEXAPRO) 10 MG tablet TAKE 1 TABLET (10 MG TOTAL) BY MOUTH DAILY. FOR ANXIETY/SLEEP  . furosemide (LASIX) 20 MG tablet TAKE 1 TABLET BY MOUTH EVERY DAY  . glucose blood (ACCU-CHEK GUIDE) test strip Use as instructed once a daily DX e11.65  . insulin glargine, 1 Unit Dial, (TOUJEO SOLOSTAR) 300 UNIT/ML Solostar Pen Use 35 units sq in the evening with food  . Insulin Pen Needle 32G X 4 MM MISC Use as directed  With toujeo  . metoprolol tartrate (LOPRESSOR) 25 MG tablet TAKE 1/2 TABLET BY MOUTH TWICE A DAY  . mirtazapine (REMERON) 7.5 MG tablet Take 7.5 mg by mouth at bedtime.  . Safety Lancets 28G MISC Use as directed once a daily Dx e11.65  . spironolactone (ALDACTONE) 25 MG tablet Take 1 tablet (25 mg total) by mouth daily. For cirrhosis of liver  .  traZODone (DESYREL) 50 MG tablet Take 0.5-1 tablets (25-50 mg total) by mouth at bedtime as needed for sleep.   Facility-Administered Encounter Medications as of 05/08/2020  Medication  . cyanocobalamin ((VITAMIN B-12)) injection 1,000 mcg    Surgical History: Past Surgical History:  Procedure Laterality Date  . CEREBRAL ANEURYSM REPAIR  1991  . COLONOSCOPY WITH PROPOFOL N/A 02/12/2019   Procedure: COLONOSCOPY WITH PROPOFOL;  Surgeon: Lin Landsman, MD;  Location: [facility] ENDOSCOPY;  Service: Gastroenterology;  Laterality: N/A;  . ESOPHAGOGASTRODUODENOSCOPY (EGD) WITH PROPOFOL N/A 02/12/2019   Procedure: ESOPHAGOGASTRODUODENOSCOPY (EGD) WITH PROPOFOL;  Surgeon: Lin Landsman, MD;  Location: [facility] ENDOSCOPY;  Service: Gastroenterology;  Laterality: N/A;  . IR ANGIOGRAM SELECTIVE EACH ADDITIONAL VESSEL  09/21/2019  . IR ANGIOGRAM SELECTIVE EACH ADDITIONAL VESSEL  09/21/2019  . IR ANGIOGRAM SELECTIVE EACH ADDITIONAL VESSEL  09/21/2019  . IR ANGIOGRAM SELECTIVE EACH ADDITIONAL VESSEL  09/21/2019  . IR ANGIOGRAM SELECTIVE EACH ADDITIONAL VESSEL  10/02/2019  . IR ANGIOGRAM VISCERAL SELECTIVE  09/21/2019  . IR ANGIOGRAM VISCERAL SELECTIVE  09/21/2019  . IR ANGIOGRAM VISCERAL SELECTIVE  10/02/2019  . IR EMBO TUMOR ORGAN ISCHEMIA INFARCT INC GUIDE ROADMAPPING  10/02/2019  . IR EMBO TUMOR ORGAN ISCHEMIA INFARCT INC GUIDE ROADMAPPING  10/02/2019  . IR RADIOLOGIST EVAL & MGMT  08/28/2019  . IR RADIOLOGIST EVAL & MGMT  01/30/2020  . IR RADIOLOGIST EVAL & MGMT  04/30/2020  . IR US GUIDE VASC ACCESS LEFT  09/21/2019  . IR US GUIDE VASC ACCESS LEFT  10/02/2019    Medical History: Past Medical History:  Diagnosis Date  . B12 deficiency 04/05/2017  . Blood in stool   . Cerebral aneurysm   . Chronic kidney disease   . Diabetes mellitus without complication ([place])   . Gastric erosion with bleeding   . Hyperlipidemia   . Hypertension   . Lower extremity cellulitis 02/08/2019  . Seizure disorder [facility])      Family History: Family History  Problem Relation Age of Onset  . Heart attack Brother 63      Review of Systems  Constitutional: Negative for appetite change, fatigue and fever.  HENT: Negative for congestion, postnasal drip and rhinorrhea.   Respiratory: Negative for cough and shortness of breath.   Cardiovascular: Negative for chest pain and leg swelling.  Gastrointestinal: Negative for abdominal pain, diarrhea, nausea and vomiting.  Musculoskeletal: Positive for arthralgias.  Psychiatric/Behavioral: Negative for behavioral problems. The patient is not nervous/anxious.      Vital Signs: BP (!) 158/64   Pulse 70   Temp (!) 97.3 F (36.3 C)   Resp 16   Ht $R'5\' 6"'ta$  (1.676 m)   Wt 168 lb (76.2 kg)   SpO2 98%   BMI 27.12 kg/m    Physical Exam Constitutional:      Appearance: Normal appearance.  HENT:     Head: Normocephalic and atraumatic.     Right Ear: Tympanic membrane normal.     Left Ear: Tympanic membrane normal.     Nose: Nose normal.  Eyes:     Extraocular Movements: Extraocular movements intact.     Pupils: Pupils are equal, round, and reactive to light.  Cardiovascular:     Rate and Rhythm: Normal rate and regular rhythm.     Pulses: Normal pulses.          Dorsalis pedis pulses are 2+ on the right side and 2+ on the left side.       Posterior tibial pulses are 2+ on the right side and 2+ on the left side.     Heart sounds: Normal heart sounds.  Pulmonary:     Effort: Pulmonary effort is normal.     Breath sounds: Normal breath sounds.  Abdominal:     General: Abdomen is flat. Bowel sounds are normal.     Palpations: Abdomen is soft.  Musculoskeletal:     Cervical back: Normal range of motion.     Right foot: Normal range of motion.     Left foot: Normal range of motion.     Comments: Reduced shoulder ROM  Feet:     Right foot:     Protective Sensation: 3 sites tested. 2 sites sensed.     Skin integrity: Skin integrity normal.     Left foot:      Protective Sensation: 3 sites tested. 1 site sensed.    Skin integrity: Skin integrity normal.  Skin:    General: Skin is warm and dry.  Neurological:     Mental Status: She is alert.  Psychiatric:        Mood and Affect: Mood normal.        Behavior: Behavior normal.      LABS: Recent Results (from the past 2160 hour(s))  POCT Glucose (CBG)     Status: Abnormal   Collection Time: 02/22/20  3:19 PM  Result Value Ref Range  POC Glucose 345 (A) 70 - 99 mg/dl  POCT Glucose (CBG)     Status: Abnormal   Collection Time: 03/04/20  3:14 PM  Result Value Ref Range   POC Glucose 364 (A) 70 - 99 mg/dl    Comment: LAST MEAL WAS AT 10 AM TODAY ENSURE DRINK  POCT Glucose (CBG)     Status: Abnormal   Collection Time: 03/08/20 11:34 AM  Result Value Ref Range   POC Glucose 510 (A) 70 - 99 mg/dl  POCT glucose (manual entry)     Status: Abnormal   Collection Time: 04/01/20 11:27 AM  Result Value Ref Range   POC Glucose 265 (A) 70 - 99 mg/dl  AFP tumor marker     Status: Abnormal   Collection Time: 04/17/20 11:08 AM  Result Value Ref Range   AFP, Serum, Tumor Marker 8.8 (H) 0.0 - 8.3 ng/mL    Comment: (NOTE) Roche [employer]ostics Electrochemiluminescence Immunoassay (ECLIA) Values obtained with different assay methods or kits cannot be used interchangeably.  Results cannot be interpreted as absolute evidence of the presence or absence of malignant disease. This test is not interpretable in pregnant females. Performed At: [facility] [place], [place] [id number] Rush Farmer MD [id number]   Hepatic function panel     Status: Abnormal   Collection Time: 04/17/20 11:08 AM  Result Value Ref Range   Total Protein 8.5 (H) 6.5 - 8.1 g/dL   Albumin 3.5 3.5 - 5.0 g/dL   AST 29 15 - 41 U/L   ALT 19 0 - 44 U/L   Alkaline Phosphatase 97 38 - 126 U/L   Total Bilirubin 0.7 0.3 - 1.2 mg/dL   Bilirubin, Direct 0.2 0.0 - 0.2 mg/dL   Indirect Bilirubin 0.5 0.3 - 0.9  mg/dL    Comment: Performed at [facility], [address] [place], [place] [address]  CBC     Status: Abnormal   Collection Time: 04/30/20  1:43 PM  Result Value Ref Range   WBC 8.7 3.4 - 10.8 x10E3/uL   RBC 4.40 3.77 - 5.28 x10E6/uL   Hemoglobin 13.4 11.1 - 15.9 g/dL   Hematocrit 39.0 34.0 - 46.6 %   MCV 89 79 - 97 fL   MCH 30.5 26.6 - 33.0 pg   MCHC 34.4 31.5 - 35.7 g/dL   RDW 11.6 (L) 11.7 - 15.4 %   Platelets 175 150 - 450 I29N9/GX  Basic metabolic panel     Status: Abnormal   Collection Time: 04/30/20  1:43 PM  Result Value Ref Range   Glucose 271 (H) 65 - 99 mg/dL   BUN 15 8 - 27 mg/dL   Creatinine, Ser 1.22 (H) 0.57 - 1.00 mg/dL   GFR calc non Af Amer 46 (L) >59 mL/min/1.73   GFR calc Af Amer 53 (L) >59 mL/min/1.73    Comment: **In accordance with recommendations from the NKF-ASN Task force,**   [facility] is in the process of updating its eGFR calculation to the   2021 CKD-EPI creatinine equation that estimates kidney function   without a race variable.    BUN/Creatinine Ratio 12 12 - 28   Sodium 136 134 - 144 mmol/L   Potassium 4.2 3.5 - 5.2 mmol/L   Chloride 97 96 - 106 mmol/L   CO2 23 20 - 29 mmol/L   Calcium 8.8 8.7 - 10.3 mg/dL    .MAMM    Assessment/Plan:   General Counseling: Margareta verbalizes understanding of the findings of  todays visit and agrees with plan of treatment. I have discussed any further diagnostic evaluation that may be needed or ordered today. We also reviewed her medications today. she has been encouraged to call the office with any questions or concerns that should arise related to todays visit.    Counseling: 1. Encounter for general adult medical examination with abnormal findings Glucose still elevated on lab work. Start trulicity sample. - DG Bone Density; Future  2. Uncontrolled type 2 diabetes with renal manifestation (HCC) A1c 9.5 today. Gave Trulicity sample in office today and gave sample to take in 1 week. Continue  insulin. Will f/u in 2 weeks to see if we need to work on getting her a prescription and potentially increase to 1.$RemoveBefor'5mg'hgFoytKCbqwa$  dose. - DG Bone Density; Future - POCT HgB A1C  3. Essential hypertension Elevated BP today, but was improved from last visit. Will continue current medication for now and recheck in 2 weeks. - DG Bone Density; Future  4. B12 deficiency - cyanocobalamin ((VITAMIN B-12)) injection 1,000 mcg - DG Bone Density; Future   No orders of the defined types were placed in this encounter.   Meds ordered this encounter  Medications  . cyanocobalamin ((VITAMIN B-12)) injection 1,000 mcg    Total time spent:30 Minutes  Time spent includes review of chart, medications, test results, and follow up plan with the patient.     Lavera Guise, MD  Internal Medicine

## 2020-05-15 ENCOUNTER — Telehealth: Payer: Self-pay | Admitting: Physician Assistant

## 2020-05-15 NOTE — Telephone Encounter (Signed)
Called to check in with pt about her blood sugar levels. She states it was 145 yesterday. Also checked to see if she took her trulicity injection today and she did. She is doing well and will follow up with [place] in 1 week as scheduled.

## 2020-05-22 ENCOUNTER — Encounter: Payer: Self-pay | Admitting: Physician Assistant

## 2020-05-22 ENCOUNTER — Ambulatory Visit (INDEPENDENT_AMBULATORY_CARE_PROVIDER_SITE_OTHER): Payer: Medicare Other | Admitting: Physician Assistant

## 2020-05-22 VITALS — BP 130/70 | HR 77 | Temp 98.5°F | Resp 16 | Ht 66.0 in | Wt 170.0 lb

## 2020-05-22 DIAGNOSIS — E1129 Type 2 diabetes mellitus with other diabetic kidney complication: Secondary | ICD-10-CM | POA: Diagnosis not present

## 2020-05-22 DIAGNOSIS — I10 Essential (primary) hypertension: Secondary | ICD-10-CM | POA: Diagnosis not present

## 2020-05-22 DIAGNOSIS — C220 Liver cell carcinoma: Secondary | ICD-10-CM | POA: Diagnosis not present

## 2020-05-22 DIAGNOSIS — IMO0002 Reserved for concepts with insufficient information to code with codable children: Secondary | ICD-10-CM

## 2020-05-22 DIAGNOSIS — Z515 Encounter for palliative care: Secondary | ICD-10-CM

## 2020-05-22 DIAGNOSIS — E1165 Type 2 diabetes mellitus with hyperglycemia: Secondary | ICD-10-CM

## 2020-05-22 NOTE — Progress Notes (Signed)
[facility] [place], [place] [address]  Internal MEDICINE  Office Visit Note  Patient Name: Adriana Miller  [id number]  [id number]  Date of Service: 05/29/2020  Chief Complaint  Patient presents with  . Diabetes    2 week f-up    HPI Pt is here for DM f/u. She has no complaints today. She has kept a log of BG, ranging 110-245 at highest in evening around 5pm. She reports she did the trulicity injection for 2 weeks as well as troujeo 35units in the evening. She has a diagnosis of hepatocellular carcinoma and reports she was seen by palliative care on 1/19 and will continue to be followed.  MRI of Liver 04/14/20 had the following findings: 1. Continued treatment response to lesions involving segment 2 and segment 3. No arterial phase enhancement associated with these lesions on today's exam to suggest viable tumor. 2. Stable appearance of small hemorrhagic cyst arising off the posterior cortex of the upper pole of left kidney. 3. Stable splenic lesion. 4. Cholelithiasis. Followed by Oncology and palliative care. Current Medication: Outpatient Encounter Medications as of 05/22/2020  Medication Sig  . allopurinol (ZYLOPRIM) 100 MG tablet TAKE 1 TABLET BY MOUTH EVERY DAY  . ELIQUIS 2.5 MG TABS tablet TAKE 1 TABLET BY MOUTH TWICE A DAY  . escitalopram (LEXAPRO) 10 MG tablet TAKE 1 TABLET (10 MG TOTAL) BY MOUTH DAILY. FOR ANXIETY/SLEEP  . furosemide (LASIX) 20 MG tablet TAKE 1 TABLET BY MOUTH EVERY DAY  . glucose blood (ACCU-CHEK GUIDE) test strip Use as instructed once a daily DX e11.65  . insulin glargine, 1 Unit Dial, (TOUJEO SOLOSTAR) 300 UNIT/ML Solostar Pen Use 35 units sq in the evening with food  . Insulin Pen Needle 32G X 4 MM MISC Use as directed  With toujeo  . metoprolol tartrate (LOPRESSOR) 25 MG tablet TAKE 1/2 TABLET BY MOUTH TWICE A DAY  . mirtazapine (REMERON) 7.5 MG tablet Take 7.5 mg by mouth at bedtime.  . Safety Lancets 28G MISC Use as  directed once a daily Dx e11.65  . spironolactone (ALDACTONE) 25 MG tablet Take 1 tablet (25 mg total) by mouth daily. For cirrhosis of liver  . traZODone (DESYREL) 50 MG tablet Take 0.5-1 tablets (25-50 mg total) by mouth at bedtime as needed for sleep.   No facility-administered encounter medications on file as of 05/22/2020.    Surgical History: Past Surgical History:  Procedure Laterality Date  . CEREBRAL ANEURYSM REPAIR  1991  . COLONOSCOPY WITH PROPOFOL N/A 02/12/2019   Procedure: COLONOSCOPY WITH PROPOFOL;  Surgeon: Lin Landsman, MD;  [facility] ENDOSCOPY;  Service: Gastroenterology;  Laterality: N/A;  . ESOPHAGOGASTRODUODENOSCOPY (EGD) WITH PROPOFOL N/A 02/12/2019   Procedure: ESOPHAGOGASTRODUODENOSCOPY (EGD) WITH PROPOFOL;  Surgeon: Lin Landsman, MD;  Location: [facility] ENDOSCOPY;  Service: Gastroenterology;  Laterality: N/A;  . IR ANGIOGRAM SELECTIVE EACH ADDITIONAL VESSEL  09/21/2019  . IR ANGIOGRAM SELECTIVE EACH ADDITIONAL VESSEL  09/21/2019  . IR ANGIOGRAM SELECTIVE EACH ADDITIONAL VESSEL  09/21/2019  . IR ANGIOGRAM SELECTIVE EACH ADDITIONAL VESSEL  09/21/2019  . IR ANGIOGRAM SELECTIVE EACH ADDITIONAL VESSEL  10/02/2019  . IR ANGIOGRAM VISCERAL SELECTIVE  09/21/2019  . IR ANGIOGRAM VISCERAL SELECTIVE  09/21/2019  . IR ANGIOGRAM VISCERAL SELECTIVE  10/02/2019  . IR EMBO TUMOR ORGAN ISCHEMIA INFARCT INC GUIDE ROADMAPPING  10/02/2019  . IR EMBO TUMOR ORGAN ISCHEMIA INFARCT INC GUIDE ROADMAPPING  10/02/2019  . IR RADIOLOGIST EVAL & MGMT  08/28/2019  . IR  RADIOLOGIST EVAL & MGMT  01/30/2020  . IR RADIOLOGIST EVAL & MGMT  04/30/2020  . IR US GUIDE VASC ACCESS LEFT  09/21/2019  . IR US GUIDE VASC ACCESS LEFT  10/02/2019    Medical History: Past Medical History:  Diagnosis Date  . B12 deficiency 04/05/2017  . Blood in stool   . Cerebral aneurysm   . Chronic kidney disease   . Diabetes mellitus without complication ([place])   . Gastric erosion with bleeding   . Hyperlipidemia   .  Hypertension   . Lower extremity cellulitis 02/08/2019  . Seizure disorder [facility])     Family History: Family History  Problem Relation Age of Onset  . Heart attack Brother 80    Social History   Socioeconomic History  . Marital status: Divorced    Spouse name: Not on file  . Number of children: Not on file  . Years of education: Not on file  . Highest education level: Not on file  Occupational History  . Not on file  Tobacco Use  . Smoking status: Never Smoker  . Smokeless tobacco: Current User    Types: Snuff  Vaping Use  . Vaping Use: Never used  Substance and Sexual Activity  . Alcohol use: Not Currently    Alcohol/week: 14.0 standard drinks    Types: 14 Cans of beer per week    Comment: patient quit in october 2020  . Drug use: No  . Sexual activity: Not Currently  Other Topics Concern  . Not on file  Social History Narrative  . Not on file   Social Determinants of Health   Financial Resource Strain: Not on file  Food Insecurity: Not on file  Transportation Needs: Not on file  Physical Activity: Not on file  Stress: Not on file  Social Connections: Not on file  Intimate Partner Violence: Not on file      Review of Systems  Constitutional: Negative for chills, fatigue and unexpected weight change.  HENT: Negative for congestion, postnasal drip, rhinorrhea, sneezing and sore throat.   Eyes: Negative for redness.  Respiratory: Negative for cough, chest tightness and shortness of breath.   Cardiovascular: Negative for chest pain and palpitations.  Gastrointestinal: Negative for abdominal pain, constipation, diarrhea, nausea and vomiting.  Genitourinary: Negative for dysuria and frequency.  Musculoskeletal: Positive for arthralgias. Negative for back pain, joint swelling and neck pain.  Skin: Negative for rash.  Neurological: Negative.  Negative for tremors and numbness.  Hematological: Negative for adenopathy. Does not bruise/bleed easily.   Psychiatric/Behavioral: Negative for behavioral problems (Depression), sleep disturbance and suicidal ideas. The patient is not nervous/anxious.     Vital Signs: BP 130/70   Pulse 77   Temp 98.5 F (36.9 C)   Resp 16   Ht 5\' 6"  (1.676 m)   Wt 170 lb (77.1 kg)   SpO2 98%   BMI 27.44 kg/m    Physical Exam Constitutional:      General: She is not in acute distress.    Appearance: She is well-developed. She is not diaphoretic.  HENT:     Head: Normocephalic and atraumatic.     Mouth/Throat:     Pharynx: No oropharyngeal exudate.  Eyes:     Pupils: Pupils are equal, round, and reactive to light.  Neck:     Thyroid: No thyromegaly.     Vascular: No JVD.     Trachea: No tracheal deviation.  Cardiovascular:     Rate and Rhythm: Normal rate and  regular rhythm.     Heart sounds: Normal heart sounds. No murmur heard. No friction rub. No gallop.   Pulmonary:     Effort: Pulmonary effort is normal. No respiratory distress.     Breath sounds: No wheezing or rales.  Chest:     Chest wall: No tenderness.  Abdominal:     General: Bowel sounds are normal.     Palpations: Abdomen is soft.  Musculoskeletal:        General: Normal range of motion.     Cervical back: Normal range of motion and neck supple.  Lymphadenopathy:     Cervical: No cervical adenopathy.  Skin:    General: Skin is warm and dry.  Neurological:     Mental Status: She is alert and oriented to person, place, and time.     Cranial Nerves: No cranial nerve deficit.     Comments: Utilizes cane for mobility  Psychiatric:        Behavior: Behavior normal.        Thought Content: Thought content normal.        Judgment: Judgment normal.        Assessment/Plan: 1. 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 Will continue Toujeo 35 units everyday with supper and monitor blood glucose levels. She will stop trulicity at this time  since it may contribute to appetite suppression.  2. Essential hypertension Stable. Continue lasix 20mg , 1/2 tab metoprolol 25mg  BID, and spironolactone 25mg   3. Hepatocellular Carcinoma Followed by oncology  4. Palliative care status She is not a surgical candidate and is being followed by palliative care.  General Counseling: indianna boran understanding of the findings of todays visit and agrees with plan of treatment. I have discussed any further diagnostic evaluation that may be needed or ordered today. We also reviewed her medications today. she has been encouraged to call the office with any questions or concerns that should arise related to todays visit.   Total time spent:30 Minutes Time spent includes review of chart, medications, test results, and follow up plan with the patient.      Dr Lavera Guise Internal medicine

## 2020-06-16 ENCOUNTER — Other Ambulatory Visit: Payer: Self-pay | Admitting: Adult Health

## 2020-06-16 ENCOUNTER — Other Ambulatory Visit: Payer: Self-pay | Admitting: Internal Medicine

## 2020-06-16 DIAGNOSIS — G2581 Restless legs syndrome: Secondary | ICD-10-CM

## 2020-06-16 DIAGNOSIS — I4891 Unspecified atrial fibrillation: Secondary | ICD-10-CM

## 2020-06-16 DIAGNOSIS — K7031 Alcoholic cirrhosis of liver with ascites: Secondary | ICD-10-CM

## 2020-06-18 ENCOUNTER — Other Ambulatory Visit: Payer: Self-pay | Admitting: Adult Health

## 2020-06-18 DIAGNOSIS — K7031 Alcoholic cirrhosis of liver with ascites: Secondary | ICD-10-CM

## 2020-06-20 ENCOUNTER — Other Ambulatory Visit: Payer: Self-pay | Admitting: Adult Health

## 2020-06-20 DIAGNOSIS — K7031 Alcoholic cirrhosis of liver with ascites: Secondary | ICD-10-CM

## 2020-06-29 ENCOUNTER — Other Ambulatory Visit: Payer: Self-pay | Admitting: Adult Health

## 2020-06-29 DIAGNOSIS — K7031 Alcoholic cirrhosis of liver with ascites: Secondary | ICD-10-CM

## 2020-07-02 ENCOUNTER — Ambulatory Visit
Admission: RE | Admit: 2020-07-02 | Discharge: 2020-07-02 | Disposition: A | Discharge status: Home / Self Care | Payer: Medicare Other | Source: Ambulatory Visit | Attending: Oncology | Admitting: Oncology

## 2020-07-02 ENCOUNTER — Other Ambulatory Visit: Payer: Self-pay

## 2020-07-02 ENCOUNTER — Inpatient Hospital Stay: Payer: Medicare Other | Attending: Oncology

## 2020-07-02 DIAGNOSIS — K766 Portal hypertension: Secondary | ICD-10-CM | POA: Diagnosis not present

## 2020-07-02 DIAGNOSIS — E119 Type 2 diabetes mellitus without complications: Secondary | ICD-10-CM | POA: Diagnosis not present

## 2020-07-02 DIAGNOSIS — E538 Deficiency of other specified B group vitamins: Secondary | ICD-10-CM | POA: Diagnosis not present

## 2020-07-02 DIAGNOSIS — Z794 Long term (current) use of insulin: Secondary | ICD-10-CM | POA: Insufficient documentation

## 2020-07-02 DIAGNOSIS — Z7901 Long term (current) use of anticoagulants: Secondary | ICD-10-CM | POA: Insufficient documentation

## 2020-07-02 DIAGNOSIS — D696 Thrombocytopenia, unspecified: Secondary | ICD-10-CM | POA: Insufficient documentation

## 2020-07-02 DIAGNOSIS — Z8249 Family history of ischemic heart disease and other diseases of the circulatory system: Secondary | ICD-10-CM | POA: Insufficient documentation

## 2020-07-02 DIAGNOSIS — K7460 Unspecified cirrhosis of liver: Secondary | ICD-10-CM | POA: Diagnosis not present

## 2020-07-02 DIAGNOSIS — C220 Liver cell carcinoma: Secondary | ICD-10-CM | POA: Insufficient documentation

## 2020-07-02 DIAGNOSIS — I10 Essential (primary) hypertension: Secondary | ICD-10-CM | POA: Diagnosis not present

## 2020-07-02 DIAGNOSIS — R778 Other specified abnormalities of plasma proteins: Secondary | ICD-10-CM

## 2020-07-02 DIAGNOSIS — Z79899 Other long term (current) drug therapy: Secondary | ICD-10-CM | POA: Diagnosis not present

## 2020-07-02 DIAGNOSIS — K8020 Calculus of gallbladder without cholecystitis without obstruction: Secondary | ICD-10-CM | POA: Diagnosis not present

## 2020-07-02 LAB — COMPREHENSIVE METABOLIC PANEL
ALT: 20 U/L (ref 0–44)
AST: 30 U/L (ref 15–41)
Albumin: 3.2 g/dL — ABNORMAL LOW (ref 3.5–5.0)
Alkaline Phosphatase: 99 U/L (ref 38–126)
Anion gap: 11 (ref 5–15)
BUN: 17 mg/dL (ref 8–23)
CO2: 26 mmol/L (ref 22–32)
Calcium: 8.9 mg/dL (ref 8.9–10.3)
Chloride: 102 mmol/L (ref 98–111)
Creatinine, Ser: 1.04 mg/dL — ABNORMAL HIGH (ref 0.44–1.00)
GFR, Estimated: 59 mL/min — ABNORMAL LOW (ref >60)
Glucose, Bld: 206 mg/dL — ABNORMAL HIGH (ref 70–99)
Potassium: 3.4 mmol/L — ABNORMAL LOW (ref 3.5–5.1)
Sodium: 139 mmol/L (ref 135–145)
Total Bilirubin: 0.8 mg/dL (ref 0.3–1.2)
Total Protein: 7.8 g/dL (ref 6.5–8.1)

## 2020-07-02 LAB — CBC WITH DIFFERENTIAL/PLATELET
Abs Immature Granulocytes: 0.02 10*3/uL (ref 0.00–0.07)
Basophils Absolute: 0 10*3/uL (ref 0.0–0.1)
Basophils Relative: 0 %
Eosinophils Absolute: 0.1 10*3/uL (ref 0.0–0.5)
Eosinophils Relative: 1 %
HCT: 37.3 % (ref 36.0–46.0)
Hemoglobin: 12.1 g/dL (ref 12.0–15.0)
Immature Granulocytes: 0 %
Lymphocytes Relative: 17 %
Lymphs Abs: 1.2 10*3/uL (ref 0.7–4.0)
MCH: 30.3 pg (ref 26.0–34.0)
MCHC: 32.4 g/dL (ref 30.0–36.0)
MCV: 93.5 fL (ref 80.0–100.0)
Monocytes Absolute: 0.4 10*3/uL (ref 0.1–1.0)
Monocytes Relative: 6 %
Neutro Abs: 5.4 10*3/uL (ref 1.7–7.7)
Neutrophils Relative %: 76 %
Platelets: 140 10*3/uL — ABNORMAL LOW (ref 150–400)
RBC: 3.99 MIL/uL (ref 3.87–5.11)
RDW: 13.7 % (ref 11.5–15.5)
WBC: 7.1 10*3/uL (ref 4.0–10.5)
nRBC: 0 % (ref 0.0–0.2)

## 2020-07-02 IMAGING — MR MR ABDOMEN WO/W CM
17 series · 46 of 48 positions shown · IV contrast (7ml Gadavist)
Comparison: Multiple priors including most recent MRI abdomen
IBRAHIM [DATE].

CLINICAL DATA: Follow-up hepatocellular carcinoma post
radioembolization in [DATE]

EXAM:
MRI ABDOMEN WITHOUT AND WITH CONTRAST
TECHNIQUE: Multiplanar multisequence MR imaging of the abdomen was performed
both before and after the administration of intravenous contrast.
CONTRAST:  7mL GADAVIST GADOBUTROL 1 MMOL/ML IV SOLN

[Series 3: T2 · coronal · 6.0mm · 1.19mm/px · 2 of 30 slices shown (1 of 2)]
[im 1/30]
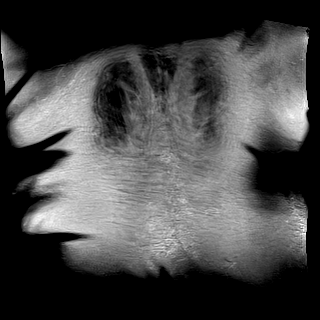
[im 30/30]
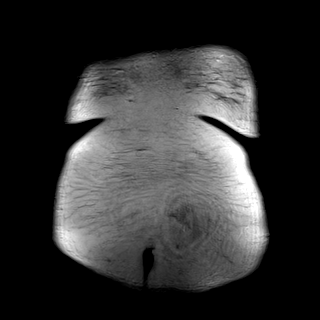

[Series 4: T2 · axial · 6.0mm · 1.19mm/px · z∈[-16,+207]mm · 2 of 32 slices shown (2 of 2)]
[im 1/32]
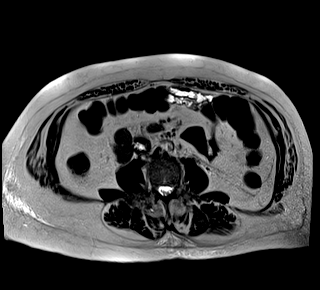
[im 32/32]
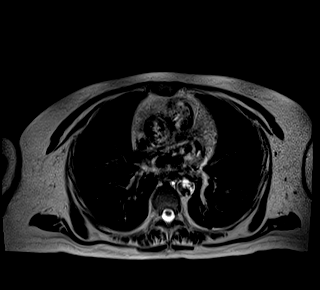

[Series 6: T2 fat-sat · axial · 6.0mm · 1.19mm/px · z∈[-23,+214]mm · 2 of 34 slices shown]
[im 1/34]
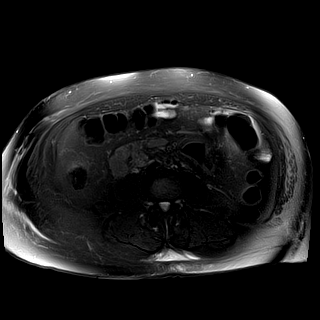
[im 34/34]
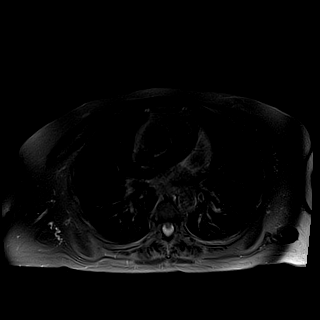

[Series 7: ax dwi_tracew · axial · 6.0mm · 1.42mm/px · z∈[-23,+214]mm · 5 of 102 slices shown]
[im 1/102]
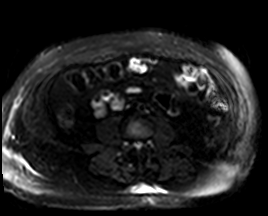
[im 26/102]
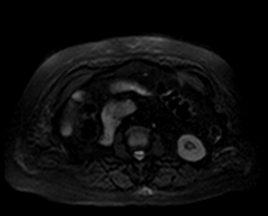
[im 51/102]
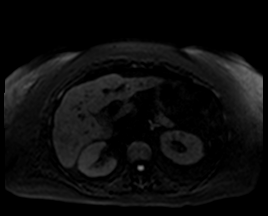
[im 76/102]
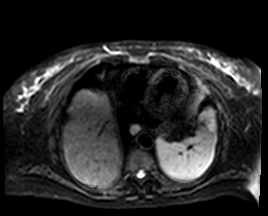
[im 102/102]
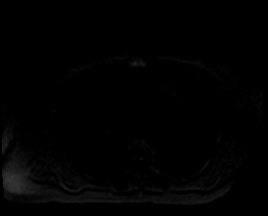

[Series 8: ax dwi_adc · axial · 6.0mm · 1.42mm/px · z∈[-23,+214]mm · 2 of 34 slices shown]
[im 1/34]
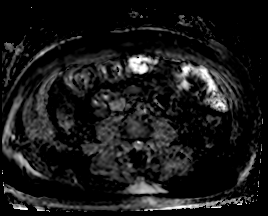
[im 34/34]
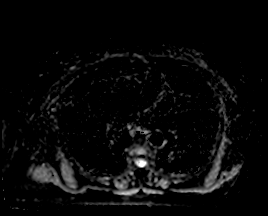

[Series 9: T1 · axial · 6.0mm · 0.74mm/px · z∈[-16,+207]mm · 4 of 64 slices shown]
[im 1/64]
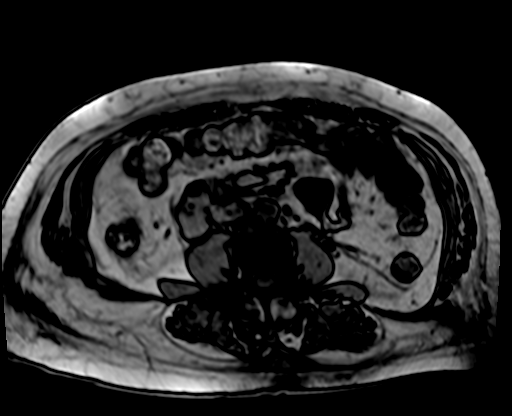
[im 22/64]
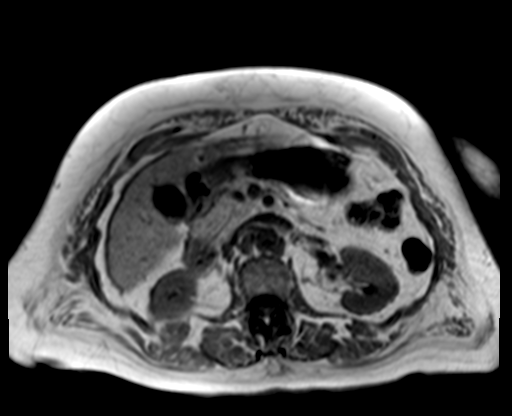
[im 43/64]
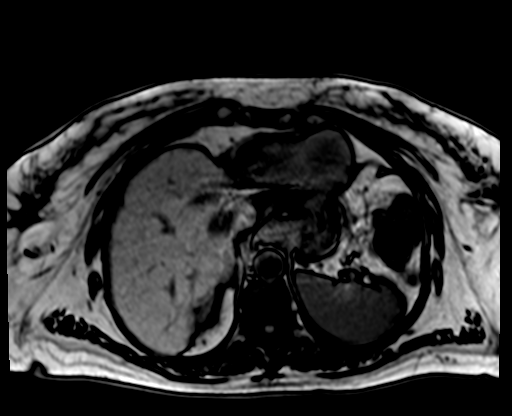
[im 64/64]
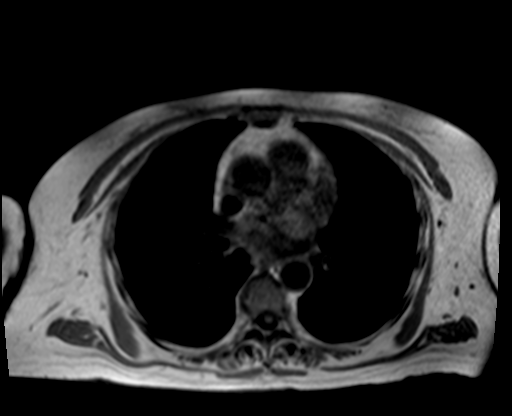

[Series 10: bSSFP · axial · 6.0mm · 0.74mm/px · 1 of 32 slices shown]
[im 1/32]
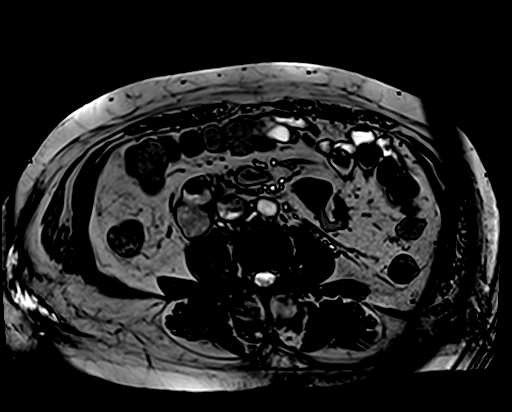

[Series 11: T1 dynamic fat-sat · axial · non-contrast · 3.0mm · 1.19mm/px · z∈[-28,+209]mm · 3 of 80 slices shown (1 of 5)]
[im 1/80]
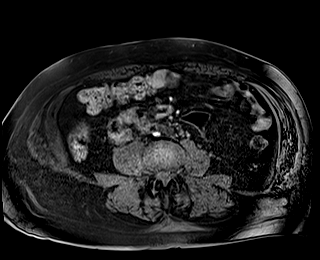
[im 40/80]
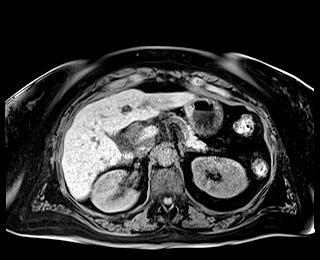
[im 80/80]
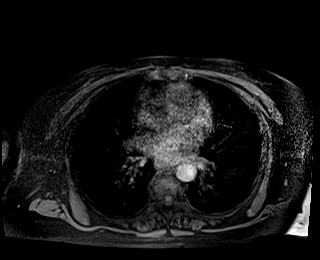

[Series 12: T1 dynamic fat-sat post-contrast · axial · 3.0mm · 1.19mm/px · z∈[-28,+209]mm · 3 of 80 slices shown (1 of 4)]
[im 1/80]
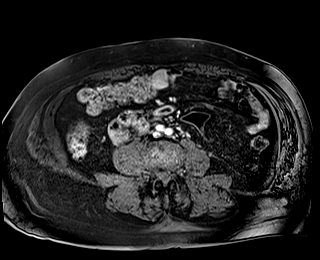
[im 40/80]
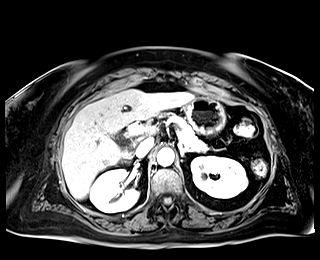
[im 80/80]
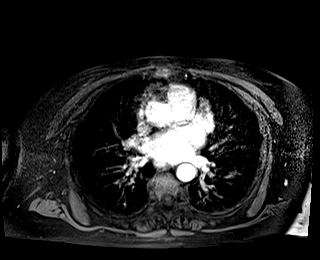

[Series 13: T1 dynamic fat-sat · axial · 3.0mm · 1.19mm/px · z∈[-28,+209]mm · 3 of 80 slices shown (2 of 5)]
[im 1/80]
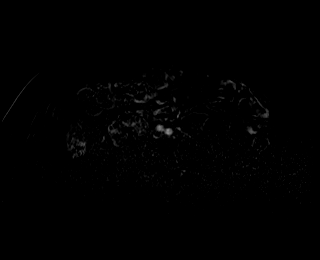
[im 40/80]
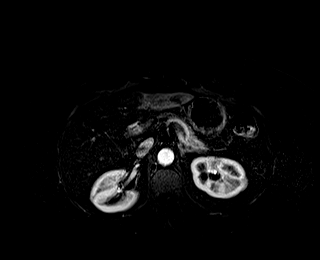
[im 80/80]
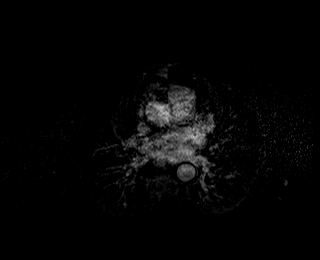

[Series 14: T1 dynamic fat-sat post-contrast · axial · 3.0mm · 1.19mm/px · z∈[-28,+209]mm · 3 of 80 slices shown (2 of 4)]
[im 1/80]
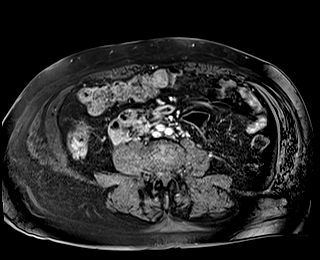
[im 40/80]
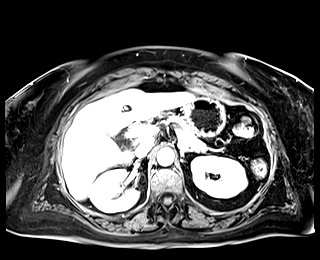
[im 80/80]
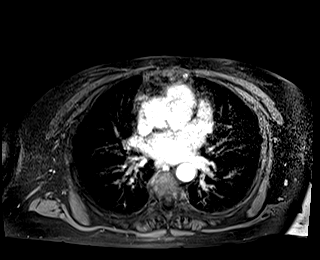

[Series 15: T1 dynamic fat-sat · axial · 3.0mm · 1.19mm/px · z∈[-28,+209]mm · 3 of 80 slices shown (3 of 5)]
[im 1/80]
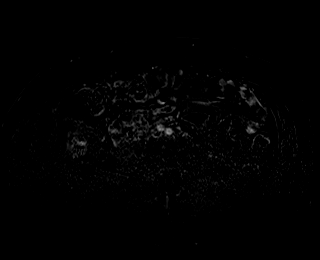
[im 40/80]
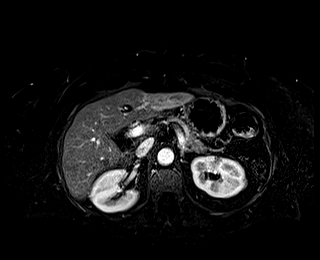
[im 80/80]
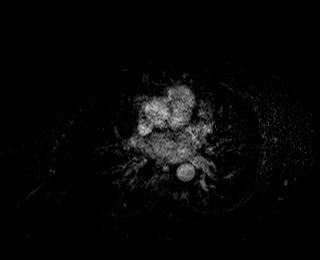

[Series 16: T1 dynamic fat-sat post-contrast · axial · 3.0mm · 1.19mm/px · z∈[-28,+209]mm · 3 of 80 slices shown (3 of 4)]
[im 1/80]
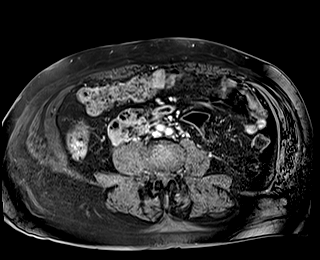
[im 40/80]
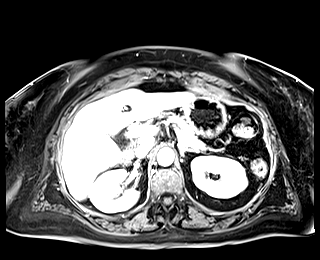
[im 80/80]
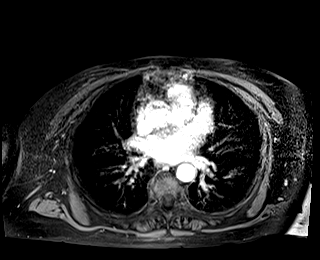

[Series 17: T1 dynamic fat-sat · axial · 3.0mm · 1.19mm/px · z∈[-28,+209]mm · 3 of 80 slices shown (4 of 5)]
[im 1/80]
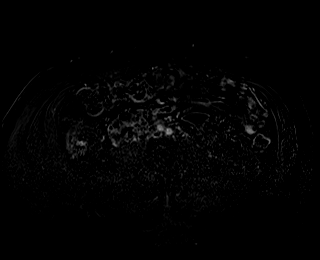
[im 40/80]
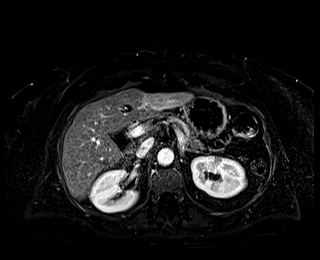
[im 80/80]
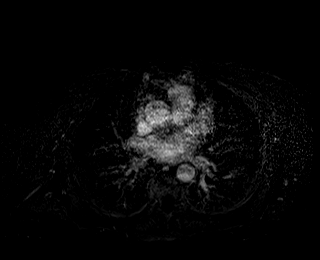

[Series 18: T1 dynamic post-contrast · coronal · 3.0mm · 1.31mm/px · 3 of 72 slices shown]
[im 1/72]
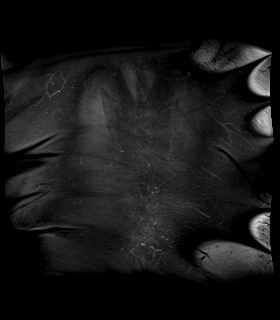
[im 36/72]
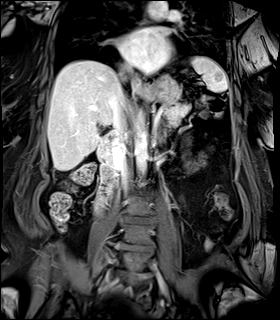
[im 72/72]
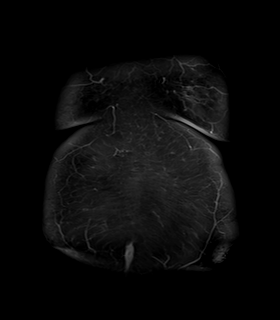

[Series 19: T1 dynamic fat-sat post-contrast · axial · 3.0mm · 1.19mm/px · z∈[-28,+209]mm · 3 of 80 slices shown (4 of 4)]
[im 1/80]
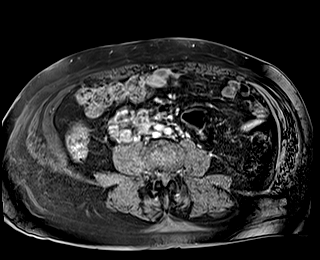
[im 40/80]
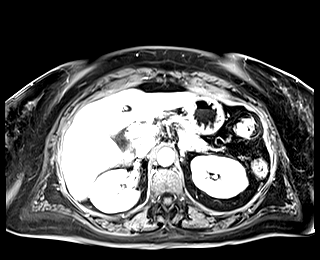
[im 80/80]
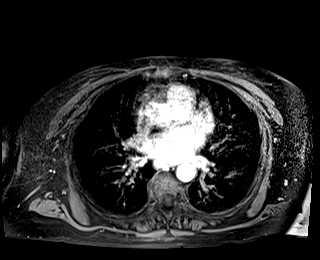

[Series 20: T1 dynamic fat-sat · axial · 3.0mm · 1.19mm/px · 1 of 80 slices shown (5 of 5)]
[im 1/80]
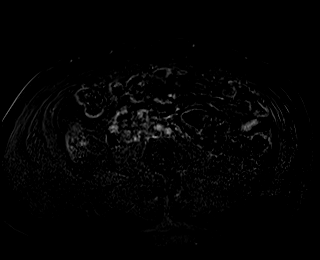

[46 of 48 positions shown; findings below may reference images not displayed]

FINDINGS: Lower chest: No acute abnormality.

Hepatobiliary: Morphologic changes of cirrhosis again noted with
post treatment change in the left lobe of the liver. Treated lesion
within segment II is again visualized without arterial in phase
enhancement or reduced diffusivity and it appears hypointense on 45
second delayed imaging now measuring 1.2 x 0.8 cm previously 1.4 x
1.0 cm on image 31/14. Treated lesion within segment III is also
decreased in size and is without arterial enhancement or reduced
diffusivity but is difficult to accurately characterize now
measuring approximately 1.1 x 0.5 cm previously 1.4 x 1.0 cm on
image 41/14. No new suspicious arterially enhancing hepatic lesions.

Numerous small cholelithiasis without evidence of acute
cholecystitis. No biliary ductal dilation.

Pancreas: No mass, inflammatory changes, or other parenchymal
abnormality identified.

Spleen: Again seen is the cystic 1.4 cm splenic lesion with
enhancing septations, favored to represent a benign lymphangioma.

Adrenals/Urinary Tract: Bilateral adrenal glands are unremarkable.
Bilateral renal cysts some of which are hemorrhagic/proteinaceous
including a 6 mm right lower pole cyst on image 53/11 and an
exophytic left interpolar cyst measuring 8 mm on image 36/11. No
solid enhancing renal lesions. No hydronephrosis.

Stomach/Bowel: Visualized portions within the abdomen are
unremarkable.

Vascular/Lymphatic: No pathologically enlarged lymph nodes
identified. Portal and hepatic veins are patent. Recannulized
umbilical vein and esophageal varices again visualized. No
abdominal aortic aneurysm demonstrated.

Other:  None.

Musculoskeletal: No suspicious bone lesions identified.
IMPRESSION: 1. Treated hepatic segment II lesion  LR TR nonviable.
2. Treated hepatic segment III lesion LR TR nonviable.
3. No new suspicious hepatic lesions.
4. Morphologic changes of cirrhosis with portal hypertension and
post treatment change in the left lobe of the liver. No new
suspicious arterially enhancing hepatic lesions.
5. Cholelithiasis. No evidence of acute cholecystitis.

## 2020-07-02 MED ORDER — GADOBUTROL 1 MMOL/ML IV SOLN
7.0000 mL | Freq: Once | INTRAVENOUS | Status: AC | PRN
  Administered 2020-07-02: 7 mL via INTRAVENOUS

## 2020-07-04 ENCOUNTER — Inpatient Hospital Stay (HOSPITAL_BASED_OUTPATIENT_CLINIC_OR_DEPARTMENT_OTHER): Payer: Medicare Other | Admitting: Oncology

## 2020-07-04 ENCOUNTER — Encounter: Payer: Self-pay | Admitting: Oncology

## 2020-07-04 ENCOUNTER — Other Ambulatory Visit: Payer: Self-pay

## 2020-07-04 VITALS — BP 149/67 | HR 67 | Temp 99.9°F | Resp 18 | Wt 179.4 lb

## 2020-07-04 DIAGNOSIS — Z79899 Other long term (current) drug therapy: Secondary | ICD-10-CM | POA: Diagnosis not present

## 2020-07-04 DIAGNOSIS — Z8249 Family history of ischemic heart disease and other diseases of the circulatory system: Secondary | ICD-10-CM | POA: Diagnosis not present

## 2020-07-04 DIAGNOSIS — C220 Liver cell carcinoma: Secondary | ICD-10-CM | POA: Diagnosis not present

## 2020-07-04 DIAGNOSIS — D696 Thrombocytopenia, unspecified: Secondary | ICD-10-CM

## 2020-07-04 DIAGNOSIS — K7031 Alcoholic cirrhosis of liver with ascites: Secondary | ICD-10-CM

## 2020-07-04 DIAGNOSIS — E538 Deficiency of other specified B group vitamins: Secondary | ICD-10-CM | POA: Diagnosis not present

## 2020-07-04 DIAGNOSIS — K7460 Unspecified cirrhosis of liver: Secondary | ICD-10-CM

## 2020-07-04 DIAGNOSIS — E119 Type 2 diabetes mellitus without complications: Secondary | ICD-10-CM | POA: Diagnosis not present

## 2020-07-04 DIAGNOSIS — Z7901 Long term (current) use of anticoagulants: Secondary | ICD-10-CM | POA: Diagnosis not present

## 2020-07-04 DIAGNOSIS — I10 Essential (primary) hypertension: Secondary | ICD-10-CM | POA: Diagnosis not present

## 2020-07-04 DIAGNOSIS — Z794 Long term (current) use of insulin: Secondary | ICD-10-CM | POA: Diagnosis not present

## 2020-07-04 LAB — PROTEIN ELECTROPHORESIS, SERUM
A/G Ratio: 0.7 (ref 0.7–1.7)
Albumin ELP: 3.1 g/dL (ref 2.9–4.4)
Alpha-1-Globulin: 0.3 g/dL (ref 0.0–0.4)
Alpha-2-Globulin: 0.7 g/dL (ref 0.4–1.0)
Beta Globulin: 1.2 g/dL (ref 0.7–1.3)
Gamma Globulin: 2.2 g/dL — ABNORMAL HIGH (ref 0.4–1.8)
Globulin, Total: 4.4 g/dL — ABNORMAL HIGH (ref 2.2–3.9)
Total Protein ELP: 7.5 g/dL (ref 6.0–8.5)

## 2020-07-04 LAB — AFP TUMOR MARKER: AFP, Serum, Tumor Marker: 7.4 ng/mL (ref 0.0–9.2)

## 2020-07-04 MED ORDER — SPIRONOLACTONE 25 MG PO TABS: 25.0000 mg | ORAL_TABLET | Freq: Every day | ORAL | 2 refills | Status: DC

## 2020-07-04 MED ORDER — METOPROLOL TARTRATE 25 MG PO TABS: 12.5000 mg | ORAL_TABLET | Freq: Two times a day (BID) | ORAL | 3 refills | Status: DC

## 2020-07-04 NOTE — Progress Notes (Signed)
Patient here for follow up. No new concerns voiced.  °

## 2020-07-04 NOTE — Progress Notes (Signed)
Hematology/Oncology follow up  [facility] [phone] [phone]   Patient Care Team: Carolynne Edouard as PCP - General (Physician Assistant) End, Harrell Gave, MD as PCP - Cardiology (Cardiology) Clent Jacks, RN as Oncology Nurse Navigator End, Harrell Gave, MD as Consulting Physician (Cardiology) Jason Coop, NP as Nurse Practitioner (Hospice and Palliative Medicine) Borders, Kirt Boys, NP as Nurse Practitioner (Hospice and Palliative Medicine) Earlie Server, MD as Consulting Physician (Oncology)  REFERRING PROVIDER: Ronnell Freshwater, NP  CHIEF COMPLAINTS/REASON FOR VISIT:  Follow-up for [facility] HISTORY OF PRESENTING ILLNESS:   Adriana Miller is a  65 y.o.  female with PMH listed below was seen in consultation at the request of  Ronnell Freshwater, NP  for evaluation of elevated protein Patient has a history of alcoholic cirrhosis.  Recent admission due to bloody diarrhea and anemia due to acute blood loss.  She underwent EGD and colonoscopy.  EGD showed gastric bleeding erosion which was cauterized.  Evidence of portal hypertensive gastropathy.  Colonoscopy showed 2 small polyps that were removed. She has stopped drinking alcohol  Her blood work on 03/28/2019 showed protein level of 9.3, total bilirubin 2.4, # 07/23/2019 MRI liver done which showed 2 liver masses both with avid hyper enhancement, both considered LR-5 (definitely [place]).  I referred patient to have been in consultation with Dr. Sheilah Pigeon surgical oncology at [facility] for resectability. Patient was seen by Dr. Maudie Mercury on 08/20/2019.  Patient was deemed not a surgical candidate due to her multiple comorbidities, functional status.  She is not a transplant candidate due to her comorbidities and functional status.  Additionally it appears that patient does not want any major abdominal operations.  Patient was recommended to have local regional therapy.  Patient prefers to stay local  to proceed with her therapy.   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 female who has above history reviewed by me today presents for follow up visit for management of [place].  Patient underwent Y 90 hepatic radioembolization in June 2021. Patient is doing well clinically.  Denies any nausea vomiting diarrhea, abdominal pain.  No fever or chills.   Review of Systems  Constitutional: Negative for appetite change, chills, fatigue, fever and unexpected weight change.  HENT:   Negative for hearing loss and voice change.   Eyes: Negative for eye problems.  Respiratory: Negative for chest tightness and cough.   Cardiovascular: Negative for chest pain.  Gastrointestinal: Negative for abdominal distention, abdominal pain and blood in stool.  Endocrine: Negative for hot flashes.  Genitourinary: Negative for difficulty urinating and frequency.   Musculoskeletal: Positive for arthralgias.  Skin: Negative for itching and rash.  Neurological: Negative for extremity weakness. Speech difficulty:    Hematological: Negative for adenopathy. Does not bruise/bleed easily.  Psychiatric/Behavioral: Negative for confusion.    MEDICAL HISTORY:  Past Medical History:  Diagnosis Date  . B12 deficiency 04/05/2017  . Blood in stool   . Cerebral aneurysm   . Chronic kidney disease   . Diabetes mellitus without complication ([place])   . Gastric erosion with bleeding   . Hyperlipidemia   . Hypertension   . Lower extremity cellulitis 02/08/2019  . Seizure disorder [facility])     SURGICAL HISTORY: Past Surgical History:  Procedure Laterality Date  . CEREBRAL ANEURYSM REPAIR  1991  . COLONOSCOPY WITH PROPOFOL N/A 02/12/2019   Procedure: COLONOSCOPY WITH PROPOFOL;  Surgeon: Marius Ditch,  Tally Due, MD;  Location: [place];  Service: Gastroenterology;  Laterality: N/A;  . ESOPHAGOGASTRODUODENOSCOPY (EGD) WITH PROPOFOL  N/A 02/12/2019   Procedure: ESOPHAGOGASTRODUODENOSCOPY (EGD) WITH PROPOFOL;  Surgeon: Lin Landsman, MD;  Location: [facility] ENDOSCOPY;  Service: Gastroenterology;  Laterality: N/A;  . IR ANGIOGRAM SELECTIVE EACH ADDITIONAL VESSEL  09/21/2019  . IR ANGIOGRAM SELECTIVE EACH ADDITIONAL VESSEL  09/21/2019  . IR ANGIOGRAM SELECTIVE EACH ADDITIONAL VESSEL  09/21/2019  . IR ANGIOGRAM SELECTIVE EACH ADDITIONAL VESSEL  09/21/2019  . IR ANGIOGRAM SELECTIVE EACH ADDITIONAL VESSEL  10/02/2019  . IR ANGIOGRAM VISCERAL SELECTIVE  09/21/2019  . IR ANGIOGRAM VISCERAL SELECTIVE  09/21/2019  . IR ANGIOGRAM VISCERAL SELECTIVE  10/02/2019  . IR EMBO TUMOR ORGAN ISCHEMIA INFARCT INC GUIDE ROADMAPPING  10/02/2019  . IR EMBO TUMOR ORGAN ISCHEMIA INFARCT INC GUIDE ROADMAPPING  10/02/2019  . IR RADIOLOGIST EVAL & MGMT  08/28/2019  . IR RADIOLOGIST EVAL & MGMT  01/30/2020  . IR RADIOLOGIST EVAL & MGMT  04/30/2020  . IR US GUIDE VASC ACCESS LEFT  09/21/2019  . IR US GUIDE VASC ACCESS LEFT  10/02/2019    SOCIAL HISTORY: Social History   Socioeconomic History  . Marital status: Divorced    Spouse name: Not on file  . Number of children: Not on file  . Years of education: Not on file  . Highest education level: Not on file  Occupational History  . Not on file  Tobacco Use  . Smoking status: Never Smoker  . Smokeless tobacco: Current User    Types: Snuff  Vaping Use  . Vaping Use: Never used  Substance and Sexual Activity  . Alcohol use: Not Currently    Alcohol/week: 14.0 standard drinks    Types: 14 Cans of beer per week    Comment: patient quit in october 2020  . Drug use: No  . Sexual activity: Not Currently  Other Topics Concern  . Not on file  Social History Narrative  . Not on file   Social Determinants of Health   Financial Resource Strain: Not on file  Food Insecurity: Not on file  Transportation Needs: Not on file  Physical Activity: Not on file  Stress: Not on file  Social Connections: Not on file   Intimate Partner Violence: Not on file    FAMILY HISTORY: Family History  Problem Relation Age of Onset  . Heart attack Brother 18    ALLERGIES:  is allergic to aspirin.  MEDICATIONS:  Current Outpatient Medications  Medication Sig Dispense Refill  . allopurinol (ZYLOPRIM) 100 MG tablet TAKE 1 TABLET BY MOUTH EVERY DAY 90 tablet 1  . ELIQUIS 2.5 MG TABS tablet TAKE 1 TABLET BY MOUTH TWICE A DAY 180 tablet 1  . escitalopram (LEXAPRO) 10 MG tablet TAKE 1 TABLET (10 MG TOTAL) BY MOUTH DAILY. FOR ANXIETY/SLEEP 90 tablet 2  . furosemide (LASIX) 20 MG tablet TAKE 1 TABLET BY MOUTH EVERY DAY 90 tablet 0  . glucose blood (ACCU-CHEK GUIDE) test strip Use as instructed once a daily DX e11.65 100 each 1  . insulin glargine, 1 Unit Dial, (TOUJEO SOLOSTAR) 300 UNIT/ML Solostar Pen Use 35 units sq in the evening with food 9 mL 3  . Insulin Pen Needle 32G X 4 MM MISC Use as directed  With toujeo 100 each 0  . mirtazapine (REMERON) 7.5 MG tablet TAKE 1 TABLET BY MOUTH AT BEDTIME. 90 tablet 1  . Safety Lancets 28G MISC Use as directed once a daily Dx e11.65  100 each 1  . traZODone (DESYREL) 50 MG tablet Take 0.5-1 tablets (25-50 mg total) by mouth at bedtime as needed for sleep. 30 tablet 2  . metoprolol tartrate (LOPRESSOR) 25 MG tablet Take 0.5 tablets (12.5 mg total) by mouth 2 (two) times daily. 90 tablet 3  . spironolactone (ALDACTONE) 25 MG tablet Take 1 tablet (25 mg total) by mouth daily. For cirrhosis of liver 90 tablet 2   No current facility-administered medications for this visit.     PHYSICAL EXAMINATION: ECOG PERFORMANCE STATUS: 1 - Symptomatic but completely ambulatory Vitals:   07/04/20 0938  BP: (!) 149/67  Pulse: 67  Resp: 18  Temp: 99.9 F (37.7 C)   Filed Weights   07/04/20 0938  Weight: 179 lb 6.4 oz (81.4 kg)    Physical Exam Constitutional:      General: She is not in acute distress.    Comments: Patient walks independantly  HENT:     Head: Normocephalic and  atraumatic.  Eyes:     General: No scleral icterus. Cardiovascular:     Rate and Rhythm: Normal rate and regular rhythm.     Heart sounds: Normal heart sounds.  Pulmonary:     Effort: Pulmonary effort is normal. No respiratory distress.     Breath sounds: No wheezing.  Abdominal:     General: Bowel sounds are normal. There is no distension.     Palpations: Abdomen is soft.  Musculoskeletal:        General: No tenderness or deformity. Normal range of motion.     Cervical back: Normal range of motion and neck supple.  Skin:    General: Skin is warm and dry.     Findings: No erythema or rash.  Neurological:     Mental Status: She is alert and oriented to person, place, and time. Mental status is at baseline.     Cranial Nerves: No cranial nerve deficit.     Coordination: Coordination normal.  Psychiatric:        Mood and Affect: Mood normal.     LABORATORY DATA:  I have reviewed the data as listed Lab Results  Component Value Date   WBC 7.1 07/02/2020   HGB 12.1 07/02/2020   HCT 37.3 07/02/2020   MCV 93.5 07/02/2020   PLT 140 (L) 07/02/2020   Recent Labs    11/22/19 1308 01/03/20 1015 04/17/20 1108 04/30/20 1343 07/02/20 1306  NA 137 141  --  136 139  K 3.4* 4.3  --  4.2 3.4*  CL 99 98  --  97 102  CO2 26 33*  --  23 26  GLUCOSE 195* 298*  --  271* 206*  BUN 24* 17  --  15 17  CREATININE 1.26* 1.27*  --  1.22* 1.04*  CALCIUM 9.1 9.6  --  8.8 8.9  GFRNONAA 44* 44*  --  46* 59*  GFRAA 51* 51*  --  53*  --   PROT 8.7* 8.9* 8.5*  --  7.8  ALBUMIN 3.7 3.6 3.5  --  3.2*  AST 24 22 29   --  30  ALT 13 15 19   --  20  ALKPHOS 102 96 97  --  99  BILITOT 0.6 1.0 0.7  --  0.8  BILIDIR  --   --  0.2  --   --   IBILI  --   --  0.5  --   --    Iron/TIBC/Ferritin/ %Sat    Component Value  Date/Time   IRON 76 02/11/2019 0616   TIBC 122 (L) 02/11/2019 0616   FERRITIN 427 (H) 02/11/2019 0616   IRONPCTSAT 62 (H) 02/11/2019 5462      RADIOGRAPHIC STUDIES: I have  personally reviewed the radiological images as listed and agreed with the findings in the report. MR LIVER W WO CONTRAST  Result Date: 07/02/2020 CLINICAL DATA:  Follow-up hepatocellular carcinoma post radioembolization in June of 2021 EXAM: MRI ABDOMEN WITHOUT AND WITH CONTRAST TECHNIQUE: Multiplanar multisequence MR imaging of the abdomen was performed both before and after the administration of intravenous contrast. CONTRAST:  16mL GADAVIST GADOBUTROL 1 MMOL/ML IV SOLN COMPARISON:  Multiple priors including most recent MRI abdomen December 04/14/2020. FINDINGS: Lower chest: No acute abnormality. Hepatobiliary: Morphologic changes of cirrhosis again noted with post treatment change in the left lobe of the liver. Treated lesion within segment II is again visualized without arterial in phase enhancement or reduced diffusivity and it appears hypointense on 45 second delayed imaging now measuring 1.2 x 0.8 cm previously 1.4 x 1.0 cm on image 31/14. Treated lesion within segment III is also decreased in size and is without arterial enhancement or reduced diffusivity but is difficult to accurately characterize now measuring approximately 1.1 x 0.5 cm previously 1.4 x 1.0 cm on image 41/14. No new suspicious arterially enhancing hepatic lesions. Numerous small cholelithiasis without evidence of acute cholecystitis. No biliary ductal dilation. Pancreas: No mass, inflammatory changes, or other parenchymal abnormality identified. 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 Adrenals/Urinary Tract: Bilateral adrenal glands are unremarkable. Bilateral renal cysts some of which are hemorrhagic/proteinaceous including a 6 mm right lower pole cyst on image 53/11 and an exophytic left interpolar cyst measuring 8 mm on image 36/11. No solid enhancing renal lesions. No hydronephrosis. Stomach/Bowel: Visualized portions within the abdomen are  unremarkable. Vascular/Lymphatic: No pathologically enlarged lymph nodes identified. Portal and hepatic veins are patent. Recannulized umbilical vein and esophageal varices again visualized. No abdominal aortic aneurysm demonstrated. Other:  None. Musculoskeletal: No suspicious bone lesions identified. IMPRESSION: 1. Treated hepatic segment II lesion  LR TR nonviable. 2. Treated hepatic segment III lesion LR TR nonviable. 3. No new suspicious hepatic lesions. 4. Morphologic changes of cirrhosis with portal hypertension and post treatment change in the left lobe of the liver. No new suspicious arterially enhancing hepatic lesions. 5. Cholelithiasis. No evidence of acute cholecystitis. Electronically Signed   By: Dahlia Bailiff MD   On: 07/02/2020 16:05   MR LIVER W WO CONTRAST  Result Date: 04/14/2020 CLINICAL DATA:  Follow-up hepatocellular carcinoma. EXAM: MRI ABDOMEN WITHOUT AND WITH CONTRAST TECHNIQUE: Multiplanar multisequence MR imaging of the abdomen was performed both before and after the administration of intravenous contrast. CONTRAST:  7.58mL GADAVIST GADOBUTROL 1 MMOL/ML IV SOLN COMPARISON:  01/03/2020 FINDINGS: Lower chest: No acute findings. Hepatobiliary: Tiny stones noted within the gallbladder. No signs of gallbladder wall inflammation or bile duct dilatation. Morphologic features of cirrhosis noted within the liver. Treated lesion within segment 2 is again noted. No signs of arterial phase enhancement on today's study. On the 45 second delayed image the treated lesion appears hypointense measuring 1.4 x 1.0 cm, image 35/15. This is compared with 1.5 x 1.3 cm previously. The second lesion of concern in segment 3 shows no arterial phase enhancement on today's study. On the delayed sequences a subtle area of hypointensity measures 1.2 x 0.8 cm, image 45/15. Previously this measured 1.7 x 1.1  cm. No new liver lesions identified.  No signs of restricted diffusion. Pancreas: No mass, inflammatory  changes, or other parenchymal abnormality identified. Spleen: Lesion within the anterior spleen measures 1.4 cm, image 19/15. Unchanged from previous exam. Adrenals/Urinary Tract: Normal appearance of the adrenal glands. Small slightly exophytic lesion arising off the posterior cortex of the upper pole of left kidney measures 1.0 x 0.8 cm, image 36/15. This shows diffusely increased T1 signal compatible with a hemorrhagic cyst. Anterior cortex of left mid kidney cyst measures 0.7 cm, image 39/15. Stomach/Bowel: Visualized portions within the abdomen are unremarkable. Vascular/Lymphatic: Normal appearance of the abdominal aorta. No adenopathy. Other:  No free fluid or fluid collections. Musculoskeletal: No suspicious bone lesions identified. IMPRESSION: 1. Continued treatment response to lesions involving segment 2 and segment 3. No arterial phase enhancement associated with these lesions on today's exam to suggest viable tumor. 2. Stable appearance of small hemorrhagic cyst arising off the posterior cortex of the upper pole of left kidney. 3. Stable splenic lesion. 4. Cholelithiasis. Electronically Signed   By: Kerby Moors M.D.   On: 04/14/2020 13:47   IR Radiologist Eval & Mgmt  Result Date: 04/30/2020 Please refer to notes tab for details about interventional procedure. (Op Note)     ASSESSMENT & PLAN:  1. Hepatocellular carcinoma ([place])   2. Thrombocytopenia ([place])   3. Hepatic cirrhosis, unspecified hepatic cirrhosis type, unspecified whether ascites present [facility])   Cancer Staging Hepatocellular carcinoma ([place]) Staging form: Liver, AJCC 8th Edition - Clinical: Stage II (cT2, cN0, cM0) - Signed by Earlie Server, MD on 01/08/2020   # Stage II Hepatocellular carcinoma, , s/p Y90 hepatic radioembolization to hepatic segment 2 and 3 on 10/02/2019 Patient is clinically doing very well. AFP has further decreased to 7.4. Interval MRI liver with and without contrast was independently reviewed by me and  discussed with patient Lesion in the hepatic subsegment II LR TR decreasing size-1.2 x 0.8cm Lesion in the hepatic subsegment III LR TR decreased in size 1.1 x0.5cm  cholelithiasis and chronic cirrhosis. No new liver lesions . Recommend continue image surveillance.  # Thrombocytopenia, stable.  #Liver cirrhosis, renal function remained stable.  Follow up: 3 months.  All questions were answered. The patient knows to call the clinic with any problems questions or concerns. Earlie Server, MD, PhD Hematology Oncology [facility] Pager- [phone] 07/04/2020

## 2020-07-08 ENCOUNTER — Other Ambulatory Visit: Payer: Medicare Other | Admitting: Primary Care

## 2020-07-08 ENCOUNTER — Other Ambulatory Visit: Payer: Self-pay

## 2020-07-16 ENCOUNTER — Other Ambulatory Visit: Payer: Self-pay

## 2020-07-16 ENCOUNTER — Other Ambulatory Visit: Payer: Medicare Other | Admitting: Primary Care

## 2020-07-17 ENCOUNTER — Other Ambulatory Visit: Payer: Self-pay | Admitting: Interventional Radiology

## 2020-07-17 DIAGNOSIS — C220 Liver cell carcinoma: Secondary | ICD-10-CM

## 2020-07-28 ENCOUNTER — Other Ambulatory Visit: Payer: Self-pay | Admitting: Internal Medicine

## 2020-07-28 ENCOUNTER — Other Ambulatory Visit: Payer: Self-pay | Admitting: Nurse Practitioner

## 2020-07-28 ENCOUNTER — Other Ambulatory Visit: Payer: Self-pay | Admitting: Hospice and Palliative Medicine

## 2020-07-28 ENCOUNTER — Other Ambulatory Visit: Payer: Self-pay | Admitting: Physician Assistant

## 2020-07-28 DIAGNOSIS — M10222 Drug-induced gout, left elbow: Secondary | ICD-10-CM

## 2020-07-28 DIAGNOSIS — IMO0002 Reserved for concepts with insufficient information to code with codable children: Secondary | ICD-10-CM

## 2020-07-28 DIAGNOSIS — E1129 Type 2 diabetes mellitus with other diabetic kidney complication: Secondary | ICD-10-CM

## 2020-07-28 DIAGNOSIS — F320 Major depressive disorder, single episode, mild: Secondary | ICD-10-CM

## 2020-07-30 ENCOUNTER — Ambulatory Visit
Admission: RE | Admit: 2020-07-30 | Discharge: 2020-07-30 | Disposition: A | Discharge status: Home / Self Care | Payer: Medicare Other | Source: Ambulatory Visit | Attending: Interventional Radiology | Admitting: Interventional Radiology

## 2020-07-30 ENCOUNTER — Other Ambulatory Visit: Payer: Medicare Other | Admitting: Primary Care

## 2020-07-30 ENCOUNTER — Other Ambulatory Visit: Payer: Self-pay

## 2020-07-30 DIAGNOSIS — C220 Liver cell carcinoma: Secondary | ICD-10-CM | POA: Diagnosis not present

## 2020-07-30 HISTORY — PX: IR RADIOLOGIST EVAL & MGMT: IMG5224

## 2020-07-30 NOTE — Progress Notes (Signed)
Chief Complaint: Patient was consulted remotely today (TeleHealth) for hepatocellular cancer at the request of Keeshawn Fakhouri K.    Referring Physician(s): Dr. Earlie Server  History of Present Illness: Adriana Miller is a 65 y.o. 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 She presented with a 2.0 cm segment II and 1.7 cm segment III mass and underwent split dose radiation segmentectomies on 10/02/19.   Her recovery was uneventful.  Initial post therapy liver MRI dated 01/03/2020 demonstrated an excellent response to therapy with no evidence of residual disease in the segment 2 lesion and equivocal residual enhancement in the segment 3 lesion.  MRI dated 04/14/2020 demonstrates continued treatment response to lesions in both the segment 2 and segment 3 with no evidence of residual viable tumor.  No new hepatic lesions are identified.  MRI 07/02/20: Treated hepatic segment II lesion  LR TR nonviable. Treated hepatic segment III lesion LR TR nonviable.  No new suspicious hepatic lesions.  Mrs. Credit reports that she is doing well and has no active complaints.  Past Medical History:  Diagnosis Date  . B12 deficiency 04/05/2017  . Blood in stool   . Cerebral aneurysm   . Chronic kidney disease   . Diabetes mellitus without complication ([place])   . Gastric erosion with bleeding   . Hyperlipidemia   . Hypertension   . Lower extremity cellulitis 02/08/2019  . Seizure disorder [facility])     Past Surgical History:  Procedure Laterality Date  . CEREBRAL ANEURYSM REPAIR  1991  . COLONOSCOPY WITH PROPOFOL N/A 02/12/2019   Procedure: COLONOSCOPY WITH PROPOFOL;  Surgeon: Lin Landsman, MD;  Location: [facility] ENDOSCOPY;  Service: Gastroenterology;  Laterality: N/A;  . ESOPHAGOGASTRODUODENOSCOPY (EGD) WITH PROPOFOL N/A 02/12/2019   Procedure: ESOPHAGOGASTRODUODENOSCOPY (EGD) WITH PROPOFOL;  Surgeon: Lin Landsman, MD;   Location: [facility] ENDOSCOPY;  Service: Gastroenterology;  Laterality: N/A;  . IR ANGIOGRAM SELECTIVE EACH ADDITIONAL VESSEL  09/21/2019  . IR ANGIOGRAM SELECTIVE EACH ADDITIONAL VESSEL  09/21/2019  . IR ANGIOGRAM SELECTIVE EACH ADDITIONAL VESSEL  09/21/2019  . IR ANGIOGRAM SELECTIVE EACH ADDITIONAL VESSEL  09/21/2019  . IR ANGIOGRAM SELECTIVE EACH ADDITIONAL VESSEL  10/02/2019  . IR ANGIOGRAM VISCERAL SELECTIVE  09/21/2019  . IR ANGIOGRAM VISCERAL SELECTIVE  09/21/2019  . IR ANGIOGRAM VISCERAL SELECTIVE  10/02/2019  . IR EMBO TUMOR ORGAN ISCHEMIA INFARCT INC GUIDE ROADMAPPING  10/02/2019  . IR EMBO TUMOR ORGAN ISCHEMIA INFARCT INC GUIDE ROADMAPPING  10/02/2019  . IR RADIOLOGIST EVAL & MGMT  08/28/2019  . IR RADIOLOGIST EVAL & MGMT  01/30/2020  . IR RADIOLOGIST EVAL & MGMT  04/30/2020  . IR US GUIDE VASC ACCESS LEFT  09/21/2019  . IR US GUIDE VASC ACCESS LEFT  10/02/2019    Allergies: Aspirin  Medications: Prior to Admission medications   Medication Sig Start Date End Date Taking? Authorizing Provider  ACCU-CHEK GUIDE test strip USE AS INSTRUCTED ONCE A DAILY DX E11.65 07/28/20   McDonough, Si Gaul, PA-C  allopurinol (ZYLOPRIM) 100 MG tablet TAKE 1 TABLET BY MOUTH EVERY DAY 07/29/20   Lavera Guise, MD  ELIQUIS 2.5 MG TABS tablet TAKE 1 TABLET BY MOUTH TWICE A DAY 06/16/20   Lavera Guise, MD  escitalopram (LEXAPRO) 10 MG tablet TAKE 1 TABLET (10 MG TOTAL) BY MOUTH DAILY. FOR ANXIETY/SLEEP 07/28/20   Lavera Guise, MD  furosemide (LASIX) 20 MG tablet TAKE 1 TABLET BY MOUTH EVERY DAY 07/28/20   End, Harrell Gave, MD  insulin glargine, 1 Unit Dial, (TOUJEO SOLOSTAR) 300 UNIT/ML Solostar Pen Use 35 units sq in the evening with food 04/01/20   Ronnell Freshwater, NP  Insulin Pen Needle 32G X 4 MM MISC Use as directed  With toujeo 03/04/20   Lavera Guise, MD  metoprolol tartrate (LOPRESSOR) 25 MG tablet Take 0.5 tablets (12.5 mg total) by mouth 2 (two) times daily. 07/04/20   McDonough, Si Gaul, PA-C  mirtazapine  (REMERON) 7.5 MG tablet TAKE 1 TABLET BY MOUTH AT BEDTIME. 06/16/20   Lavera Guise, MD  Safety Lancets 28G MISC Use as directed once a daily Dx e11.65 02/04/20   Luiz Ochoa, NP  spironolactone (ALDACTONE) 25 MG tablet Take 1 tablet (25 mg total) by mouth daily. For cirrhosis of liver 07/04/20   McDonough, Si Gaul, PA-C  traZODone (DESYREL) 50 MG tablet Take 0.5-1 tablets (25-50 mg total) by mouth at bedtime as needed for sleep. 09/05/19   Borders, Kirt Boys, NP     Family History  Problem Relation Age of Onset  . Heart attack Brother 70    Social History   Socioeconomic History  . Marital status: Divorced    Spouse name: Not on file  . Number of children: Not on file  . Years of education: Not on file  . Highest education level: Not on file  Occupational History  . Not on file  Tobacco Use  . Smoking status: Never Smoker  . Smokeless tobacco: Current User    Types: Snuff  Vaping Use  . Vaping Use: Never used  Substance and Sexual Activity  . Alcohol use: Not Currently    Alcohol/week: 14.0 standard drinks    Types: 14 Cans of beer per week    Comment: patient quit in october 2020  . Drug use: No  . Sexual activity: Not Currently  Other Topics Concern  . Not on file  Social History Narrative  . Not on file   Social Determinants of Health   Financial Resource Strain: Not on file  Food Insecurity: Not on file  Transportation Needs: Not on file  Physical Activity: Not on file  Stress: Not on file  Social Connections: Not on file    ECOG Status: 0 - Asymptomatic  Review of Systems  Review of Systems: A 12 point ROS discussed and pertinent positives are indicated in the HPI above.  All other systems are negative.  Physical Exam No direct physical exam was performed (except for noted visual exam findings with Video Visits).   Vital Signs: There were no vitals taken for this visit.  Imaging: MR LIVER W WO CONTRAST  Result Date: 07/02/2020 CLINICAL DATA:   Follow-up hepatocellular carcinoma post radioembolization in June of 2021 EXAM: MRI ABDOMEN WITHOUT AND WITH CONTRAST TECHNIQUE: Multiplanar multisequence MR imaging of the abdomen was performed both before and after the administration of intravenous contrast. CONTRAST:  57mL GADAVIST GADOBUTROL 1 MMOL/ML IV SOLN COMPARISON:  Multiple priors including most recent MRI abdomen December 04/14/2020. FINDINGS: Lower chest: No acute abnormality. Hepatobiliary: Morphologic changes of cirrhosis again noted with post treatment change in the left lobe of the liver. Treated lesion within segment II is again visualized without arterial in phase enhancement or reduced diffusivity and it appears hypointense on 45 second delayed imaging now measuring 1.2 x 0.8 cm previously 1.4 x 1.0 cm on image 31/14. Treated lesion within segment III is also decreased in size and is without arterial enhancement or reduced diffusivity but is difficult to  accurately characterize now measuring approximately 1.1 x 0.5 cm previously 1.4 x 1.0 cm on image 41/14. No new suspicious arterially enhancing hepatic lesions. Numerous small cholelithiasis without evidence of acute cholecystitis. No biliary ductal dilation. Pancreas: No mass, inflammatory changes, or other parenchymal abnormality identified. 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 Adrenals/Urinary Tract: Bilateral adrenal glands are unremarkable. Bilateral renal cysts some of which are hemorrhagic/proteinaceous including a 6 mm right lower pole cyst on image 53/11 and an exophytic left interpolar cyst measuring 8 mm on image 36/11. No solid enhancing renal lesions. No hydronephrosis. Stomach/Bowel: Visualized portions within the abdomen are unremarkable. Vascular/Lymphatic: No pathologically enlarged lymph nodes identified. Portal and hepatic veins are patent. Recannulized umbilical vein and esophageal varices  again visualized. No abdominal aortic aneurysm demonstrated. Other:  None. Musculoskeletal: No suspicious bone lesions identified. IMPRESSION: 1. Treated hepatic segment II lesion  LR TR nonviable. 2. Treated hepatic segment III lesion LR TR nonviable. 3. No new suspicious hepatic lesions. 4. Morphologic changes of cirrhosis with portal hypertension and post treatment change in the left lobe of the liver. No new suspicious arterially enhancing hepatic lesions. 5. Cholelithiasis. No evidence of acute cholecystitis. Electronically Signed   By: Dahlia Bailiff MD   On: 07/02/2020 16:05    Labs:  CBC: Recent Labs    11/22/19 1308 01/03/20 1015 04/30/20 1343 07/02/20 1306  WBC 6.0 5.3 8.7 7.1  HGB 12.8 13.2 13.4 12.1  HCT 38.8 39.1 39.0 37.3  PLT 139* 139* 175 140*    COAGS: Recent Labs    09/21/19 0900 10/02/19 0840  INR 1.1 1.2    BMP: Recent Labs    10/02/19 0840 11/22/19 1308 01/03/20 1015 04/30/20 1343 07/02/20 1306  NA 141 137 141 136 139  K 3.8 3.4* 4.3 4.2 3.4*  CL 103 99 98 97 102  CO2 27 26 33* 23 26  GLUCOSE 147* 195* 298* 271* 206*  BUN 20 24* 17 15 17   CALCIUM 9.4 9.1 9.6 8.8 8.9  CREATININE 1.19* 1.26* 1.27* 1.22* 1.04*  GFRNONAA 48* 44* 44* 46* 59*  GFRAA 55* 51* 51* 53*  --     LIVER FUNCTION TESTS: Recent Labs    11/22/19 1308 01/03/20 1015 04/17/20 1108 07/02/20 1306  BILITOT 0.6 1.0 0.7 0.8  AST 24 22 29 30   ALT 13 15 19 20   ALKPHOS 102 96 97 99  PROT 8.7* 8.9* 8.5* 7.8  ALBUMIN 3.7 3.6 3.5 3.2*    TUMOR MARKERS: No results for input(s): AFPTM, CEA, CA199, CHROMGRNA in the last 8760 hours.  Assessment and Plan:  Continues to do well 9 months status post transarterial radiation segmentectomy of hepatic segments 2 and 3 for multifocal hepatocellular carcinoma.  Most recent surveillance MR imaging dated 06/25/2020 demonstrates no evidence of residual, recurrent or new disease.  Continue active surveillance with follow-up gadolinium enhanced  MRI of the abdomen every 3 months.  1.)  Next gadolinium enhanced MRI of the abdomen and accompanying clinic visit in June 2022.   Electronically Signed: Criselda Peaches 07/30/2020, 2:54 PM   I spent a total of 15 Minutes in remote  clinical consultation, greater than 50% of which was counseling/coordinating care for hepatocellular cancer.    Visit type: Audio only (telephone). Audio (no video) only due to patient preference. Alternative for in-person consultation at [facility], [address], [place], [place]. This visit type was conducted due to national recommendations  for restrictions regarding the COVID-19 Pandemic (e.g. social distancing).  This format is felt to be most appropriate for this patient at this time.  All issues noted in this document were discussed and addressed.

## 2020-08-07 ENCOUNTER — Other Ambulatory Visit: Payer: Medicare Other | Admitting: Primary Care

## 2020-08-07 ENCOUNTER — Other Ambulatory Visit: Payer: Self-pay

## 2020-08-07 DIAGNOSIS — K7030 Alcoholic cirrhosis of liver without ascites: Secondary | ICD-10-CM

## 2020-08-07 DIAGNOSIS — E1165 Type 2 diabetes mellitus with hyperglycemia: Secondary | ICD-10-CM | POA: Diagnosis not present

## 2020-08-07 DIAGNOSIS — Z515 Encounter for palliative care: Secondary | ICD-10-CM

## 2020-08-07 NOTE — Progress Notes (Signed)
Therapist, nutritional Palliative Care Consult Note Telephone: [phone]  Fax: [phone]    Date of encounter: 08/07/20 PATIENT NAME: Adriana Miller [address] Adriana Miller [place] [address]   [phone] (home)  DOB: 1956-01-26 MRN: [id number] PRIMARY CARE PROVIDER:    Alan Ripper,  [address] [place] [address] [phone]  REFERRING PROVIDER:   Alan Ripper [address] [place],  [place] [address] [phone]  RESPONSIBLE PARTY:    Contact Information    Name Relation Home Work Mobile   Torain,James Brother [phone]  [phone]       I met face to face with patient and caregiver in  home. Palliative Care was asked to follow this patient by consultation request of  McDonough, Salomon Fick, PA* to address advance care planning and complex medical decision making. This is a follow up visit.                                   ASSESSMENT AND PLAN / RECOMMENDATIONS:   Advance Care Planning/Goals of Care: Goals include to maximize quality of life and symptom management. Our advance care planning conversation included a discussion about:     The value and importance of advance care planning   Experiences with loved ones who have been seriously ill or have died   Exploration of personal, cultural or spiritual beliefs that might influence medical decisions   Exploration of goals of care in the event of a sudden injury or illness   Identification of a healthcare agent - brother and granddaughter  Review of an  advance directive document .  CODE STATUS: had said full code, now is saying maybe to let her go. She will review the options again. Her decision before had been in the case of her demise she wanted to be prolonged for her granddaughter to arrive from out of state. Now she lives locally, so pt will reconsider.   Symptom Management/Plan:  Glucose management: Sugars are high, between 250-350 mg/ dl on home  machine. Recommend adding other diabetic medications, ideally those that are weight negative. She endorses weight gain. Her diet has items that are high in carbohydrate but these give her QoL.  Shoulder pain: Endorses pain a few months ago. Denies injury. Needs exam by ortho and possible imaging. Please refer to ortho for f/u.  Follow up Palliative Care Visit: Palliative care will continue to follow for complex medical decision making, advance care planning, and clarification of goals. Return 6-8 weeks or prn.  I spent 40 minutes providing this consultation. More than 50% of the time in this consultation was spent in counseling and care coordination.  PPS: 40%  HOSPICE ELIGIBILITY/DIAGNOSIS: TBD  Chief Complaint: elevated glucose, shoulder pain  HISTORY OF PRESENT ILLNESS:  Adriana Miller is a 65 y.o. year old female  with liver disease, cirrhosis and hepatocellular carcinoma , DM, shoulder immobility and pain, CHF . She is seen today to discuss her elevated glucose, shoulder pain and advance directives  History obtained from review of EMR, discussion with primary team, and interview with family, facility staff/caregiver and/or Adriana Miller.  I reviewed available labs, medications, imaging, studies and related documents from the EMR.  Records reviewed and summarized above.   ROS General: NAD EYES: endorses blurry  vision changes at times  ENMT: denies dysphagia Cardiovascular: denies chest pain, endorses DOE in home Pulmonary: denies  cough, denies increased SOB Abdomen: endorses good appetite, endorses weight gain, denies constipation, endorses continence of bowel GU: denies dysuria, endorses continence of urine MSK:  Endorses general  weakness,  no falls reported Skin: denies rashes or wounds Neurological: endorses shoulder  pain, denies insomnia Psych: Endorses positive mood Heme/lymph/immuno: denies bruises, abnormal bleeding  Physical Exam: Current and past weights: not  available Constitutional: NAD General: frail appearing, WNWD EYES: anicteric sclera, lids intact, no discharge  ENMT: intact hearing, oral mucous membranes moist, poor dentition CV: S1S2, RRR, no LE edema Pulmonary: LCTA, no increased work of breathing, nocturnal  cough, room air Abdomen: intake 100%, soft and non tender, no ascites MSK: no sarcopenia, moves all extremities, ambulatory with walker  Skin: warm and dry, no rashes or wounds on visible skin Neuro:  + generalized weakness,  Mild  cognitive impairment Psych: non-anxious affect, A and O x 3 Hem/lymph/immuno: no widespread bruising   Thank you for the opportunity to participate in the care of Adriana Miller.  The palliative care team will continue to follow. Please call our office at [phone] if we can be of additional assistance.   Jason Coop, NP , DNP, MPH, AGPCNP-BC, ACHPN  COVID-19 PATIENT SCREENING TOOL Asked and negative response unless otherwise noted:   Have you had symptoms of covid, tested positive or been in contact with someone with symptoms/positive test in the past 5-10 days?

## 2020-08-21 ENCOUNTER — Ambulatory Visit: Payer: Medicare Other | Admitting: Physician Assistant

## 2020-08-25 ENCOUNTER — Ambulatory Visit (INDEPENDENT_AMBULATORY_CARE_PROVIDER_SITE_OTHER): Payer: Medicare Other | Admitting: Physician Assistant

## 2020-08-25 ENCOUNTER — Encounter: Payer: Self-pay | Admitting: Physician Assistant

## 2020-08-25 DIAGNOSIS — M10222 Drug-induced gout, left elbow: Secondary | ICD-10-CM

## 2020-08-25 DIAGNOSIS — E1129 Type 2 diabetes mellitus with other diabetic kidney complication: Secondary | ICD-10-CM | POA: Diagnosis not present

## 2020-08-25 DIAGNOSIS — M25512 Pain in left shoulder: Secondary | ICD-10-CM | POA: Diagnosis not present

## 2020-08-25 DIAGNOSIS — E1165 Type 2 diabetes mellitus with hyperglycemia: Secondary | ICD-10-CM

## 2020-08-25 DIAGNOSIS — E538 Deficiency of other specified B group vitamins: Secondary | ICD-10-CM | POA: Diagnosis not present

## 2020-08-25 DIAGNOSIS — I10 Essential (primary) hypertension: Secondary | ICD-10-CM

## 2020-08-25 DIAGNOSIS — C220 Liver cell carcinoma: Secondary | ICD-10-CM

## 2020-08-25 DIAGNOSIS — IMO0002 Reserved for concepts with insufficient information to code with codable children: Secondary | ICD-10-CM

## 2020-08-25 DIAGNOSIS — Z515 Encounter for palliative care: Secondary | ICD-10-CM

## 2020-08-25 DIAGNOSIS — G8929 Other chronic pain: Secondary | ICD-10-CM

## 2020-08-25 MED ORDER — ALLOPURINOL 100 MG PO TABS: 100.0000 mg | ORAL_TABLET | Freq: Every day | ORAL | 1 refills | Status: DC

## 2020-08-25 MED ORDER — CYANOCOBALAMIN 1000 MCG/ML IJ SOLN
1000.0000 ug | Freq: Once | INTRAMUSCULAR | Status: AC
  Administered 2020-08-25: 1000 ug via INTRAMUSCULAR

## 2020-08-25 MED ORDER — INSULIN PEN NEEDLE 32G X 4 MM MISC: 0 refills | Status: DC

## 2020-08-25 MED ORDER — GLIMEPIRIDE 1 MG PO TABS
1.0000 mg | ORAL_TABLET | Freq: Every day | ORAL | 2 refills | Status: DC
Start: 2020-08-25 — End: 2020-11-03

## 2020-08-25 NOTE — Progress Notes (Signed)
[facility] [place], [place] [address]  Internal MEDICINE  Office Visit Note  Patient Name: Adriana Miller  [id number]  [id number]  Date of Service: 08/27/2020  Chief Complaint  Patient presents with  . Follow-up    Left arm hurts to lift/reach for things, started about 2 months ago  . Hypertension  . Hyperlipidemia  . Diabetes    HPI Pt is here for routine follow up and is doing well. -BG in PM range 130-400 at highest when she checked after eating and hadnt taken medication. -insulin 36 units in the evening with supper -Has been having some left arm pain that worsened about 2 months ago and is difficult to reach above head or reach and pick things up. Sleeps on her back and is unaware of any injuries to it. -She has hepatocellular carcinoma followed by oncology and is also followed by palliative care  Current Medication: Outpatient Encounter Medications as of 08/25/2020  Medication Sig  . glimepiride (AMARYL) 1 MG tablet Take 1 tablet (1 mg total) by mouth daily with breakfast.  . ACCU-CHEK GUIDE test strip USE AS INSTRUCTED ONCE A DAILY DX E11.65  . allopurinol (ZYLOPRIM) 100 MG tablet Take 1 tablet (100 mg total) by mouth daily.  Marland Kitchen ELIQUIS 2.5 MG TABS tablet TAKE 1 TABLET BY MOUTH TWICE A DAY  . escitalopram (LEXAPRO) 10 MG tablet TAKE 1 TABLET (10 MG TOTAL) BY MOUTH DAILY. FOR ANXIETY/SLEEP  . furosemide (LASIX) 20 MG tablet TAKE 1 TABLET BY MOUTH EVERY DAY  . insulin glargine, 1 Unit Dial, (TOUJEO SOLOSTAR) 300 UNIT/ML Solostar Pen Use 35 units sq in the evening with food  . Insulin Pen Needle 32G X 4 MM MISC Use as directed  With toujeo  . metoprolol tartrate (LOPRESSOR) 25 MG tablet Take 0.5 tablets (12.5 mg total) by mouth 2 (two) times daily.  . mirtazapine (REMERON) 7.5 MG tablet TAKE 1 TABLET BY MOUTH AT BEDTIME.  . Safety Lancets 28G MISC Use as directed once a daily Dx e11.65  . spironolactone (ALDACTONE) 25 MG tablet Take 1 tablet (25 mg  total) by mouth daily. For cirrhosis of liver  . traZODone (DESYREL) 50 MG tablet Take 0.5-1 tablets (25-50 mg total) by mouth at bedtime as needed for sleep.  . [DISCONTINUED] allopurinol (ZYLOPRIM) 100 MG tablet TAKE 1 TABLET BY MOUTH EVERY DAY  . [DISCONTINUED] Insulin Pen Needle 32G X 4 MM MISC Use as directed  With toujeo  . [EXPIRED] cyanocobalamin ((VITAMIN B-12)) injection 1,000 mcg    No facility-administered encounter medications on file as of 08/25/2020.    Surgical History: Past Surgical History:  Procedure Laterality Date  . CEREBRAL ANEURYSM REPAIR  1991  . COLONOSCOPY WITH PROPOFOL N/A 02/12/2019   Procedure: COLONOSCOPY WITH PROPOFOL;  Surgeon: Lin Landsman, MD;  Location: [facility] ENDOSCOPY;  Service: Gastroenterology;  Laterality: N/A;  . ESOPHAGOGASTRODUODENOSCOPY (EGD) WITH PROPOFOL N/A 02/12/2019   Procedure: ESOPHAGOGASTRODUODENOSCOPY (EGD) WITH PROPOFOL;  Surgeon: Lin Landsman, MD;  Location: [facility] ENDOSCOPY;  Service: Gastroenterology;  Laterality: N/A;  . IR ANGIOGRAM SELECTIVE EACH ADDITIONAL VESSEL  09/21/2019  . IR ANGIOGRAM SELECTIVE EACH ADDITIONAL VESSEL  09/21/2019  . IR ANGIOGRAM SELECTIVE EACH ADDITIONAL VESSEL  09/21/2019  . IR ANGIOGRAM SELECTIVE EACH ADDITIONAL VESSEL  09/21/2019  . IR ANGIOGRAM SELECTIVE EACH ADDITIONAL VESSEL  10/02/2019  . IR ANGIOGRAM VISCERAL SELECTIVE  09/21/2019  . IR ANGIOGRAM VISCERAL SELECTIVE  09/21/2019  . IR ANGIOGRAM VISCERAL SELECTIVE  10/02/2019  .  IR EMBO TUMOR ORGAN ISCHEMIA INFARCT INC GUIDE ROADMAPPING  10/02/2019  . IR EMBO TUMOR ORGAN ISCHEMIA INFARCT INC GUIDE ROADMAPPING  10/02/2019  . IR RADIOLOGIST EVAL & MGMT  08/28/2019  . IR RADIOLOGIST EVAL & MGMT  01/30/2020  . IR RADIOLOGIST EVAL & MGMT  04/30/2020  . IR RADIOLOGIST EVAL & MGMT  07/30/2020  . IR US GUIDE VASC ACCESS LEFT  09/21/2019  . IR US GUIDE VASC ACCESS LEFT  10/02/2019    Medical History: Past Medical History:  Diagnosis Date  . B12 deficiency  04/05/2017  . Blood in stool   . Cerebral aneurysm   . Chronic kidney disease   . Diabetes mellitus without complication ([place])   . Gastric erosion with bleeding   . Hyperlipidemia   . Hypertension   . Lower extremity cellulitis 02/08/2019  . Seizure disorder [facility])     Family History: Family History  Problem Relation Age of Onset  . Heart attack Brother 28    Social History   Socioeconomic History  . Marital status: Divorced    Spouse name: Not on file  . Number of children: Not on file  . Years of education: Not on file  . Highest education level: Not on file  Occupational History  . Not on file  Tobacco Use  . Smoking status: Never Smoker  . Smokeless tobacco: Current User    Types: Snuff  Vaping Use  . Vaping Use: Never used  Substance and Sexual Activity  . Alcohol use: Not Currently    Alcohol/week: 14.0 standard drinks    Types: 14 Cans of beer per week    Comment: patient quit in october 2020  . Drug use: No  . Sexual activity: Not Currently  Other Topics Concern  . Not on file  Social History Narrative  . Not on file   Social Determinants of Health   Financial Resource Strain: Not on file  Food Insecurity: Not on file  Transportation Needs: Not on file  Physical Activity: Not on file  Stress: Not on file  Social Connections: Not on file  Intimate Partner Violence: Not on file      Review of Systems  Constitutional: Positive for fatigue. Negative for chills and unexpected weight change.  HENT: Positive for postnasal drip. Negative for congestion, rhinorrhea, sneezing and sore throat.   Eyes: Negative for redness.  Respiratory: Negative for cough, chest tightness and shortness of breath.   Cardiovascular: Negative for chest pain and palpitations.  Gastrointestinal: Negative for abdominal pain, constipation, diarrhea, nausea and vomiting.  Genitourinary: Negative for dysuria and frequency.  Musculoskeletal: Positive for arthralgias. Negative for  back pain, joint swelling and neck pain.  Skin: Negative for rash.  Neurological: Negative.  Negative for tremors and numbness.  Hematological: Negative for adenopathy. Does not bruise/bleed easily.  Psychiatric/Behavioral: Negative for behavioral problems (Depression), sleep disturbance and suicidal ideas. The patient is not nervous/anxious.     Vital Signs: BP 120/68   Pulse (!) 58   Temp (!) 97.2 F (36.2 C)   Resp 16   Ht 5\' 6"  (1.676 m)   Wt 182 lb 9.6 oz (82.8 kg)   SpO2 99%   BMI 29.47 kg/m    Physical Exam Vitals and nursing note reviewed.  Constitutional:      General: She is not in acute distress.    Appearance: She is well-developed. She is not diaphoretic.  HENT:     Head: Normocephalic and atraumatic.     Mouth/Throat:  Pharynx: No oropharyngeal exudate.  Eyes:     Pupils: Pupils are equal, round, and reactive to light.  Neck:     Thyroid: No thyromegaly.     Vascular: No JVD.     Trachea: No tracheal deviation.  Cardiovascular:     Rate and Rhythm: Normal rate and regular rhythm.     Heart sounds: Normal heart sounds. No murmur heard. No friction rub. No gallop.   Pulmonary:     Effort: Pulmonary effort is normal. No respiratory distress.     Breath sounds: No wheezing or rales.  Chest:     Chest wall: No tenderness.  Abdominal:     General: Bowel sounds are normal.     Palpations: Abdomen is soft.  Musculoskeletal:     Cervical back: Normal range of motion and neck supple.     Comments: decreased ROM and pain in left shoulder  Lymphadenopathy:     Cervical: No cervical adenopathy.  Skin:    General: Skin is warm and dry.  Neurological:     Mental Status: She is alert and oriented to person, place, and time.     Cranial Nerves: No cranial nerve deficit.     Gait: Gait abnormal.     Comments: Walks with cane for stability  Psychiatric:        Behavior: Behavior normal.        Thought Content: Thought content normal.        Judgment:  Judgment normal.        Assessment/Plan: 1. 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 Continue to monitor closely - Insulin Pen Needle 32G X 4 MM MISC; Use as directed  With toujeo  Dispense: 100 each; Refill: 0 - glimepiride (AMARYL) 1 MG tablet; Take 1 tablet (1 mg total) by mouth daily with breakfast.  Dispense: 30 tablet; Refill: 2  2. Chronic left shoulder pain Will refer to orthopedics for possible injection  - AMB referral to orthopedics  3. Hepatocellular carcinoma ([place]) Followed by oncology  4. Palliative care status Followed by palliative care  5. 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  Dispense: 90 tablet; Refill: 1  6. B12 deficiency - cyanocobalamin ((VITAMIN B-12)) injection 1,000 mcg  7. Essential hypertension Well controlled, continue medications as before   General Counseling: V[place]ta verbalizes understanding of the findings of todays visit and agrees with plan of treatment. I have discussed any further diagnostic evaluation that may be needed or ordered today. We also reviewed her medications today. she has been encouraged to call the office with any questions or concerns that should arise related to todays visit.    Orders Placed This Encounter  Procedures  . AMB referral to orthopedics  . POCT HgB A1C    Meds ordered this encounter  Medications  . cyanocobalamin ((VITAMIN B-12)) injection 1,000 mcg  . allopurinol (ZYLOPRIM) 100 MG tablet    Sig: Take 1 tablet (100 mg total) by mouth daily.    Dispense:  90 tablet    Refill:  1  . Insulin Pen Needle 32G X 4 MM MISC    Sig: Use as directed  With toujeo    Dispense:  100 each    Refill:  0  . glimepiride (AMARYL)  1 MG tablet    Sig: Take 1 tablet (1 mg total)  by mouth daily with breakfast.    Dispense:  30 tablet    Refill:  2    This patient was seen by Drema Dallas, PA-C in collaboration with Dr. Clayborn Bigness as a part of collaborative care agreement.   Total time spent:30 Minutes Time spent includes review of chart, medications, test results, and follow up plan with the patient.      Dr Lavera Guise Internal medicine

## 2020-09-02 LAB — POCT GLYCOSYLATED HEMOGLOBIN (HGB A1C): Hemoglobin A1C: 8.4 % — AB (ref 4.0–5.6)

## 2020-09-17 DIAGNOSIS — M19011 Primary osteoarthritis, right shoulder: Secondary | ICD-10-CM | POA: Diagnosis not present

## 2020-09-17 DIAGNOSIS — M7541 Impingement syndrome of right shoulder: Secondary | ICD-10-CM | POA: Diagnosis not present

## 2020-09-23 ENCOUNTER — Other Ambulatory Visit: Payer: Self-pay | Admitting: Interventional Radiology

## 2020-09-23 DIAGNOSIS — C220 Liver cell carcinoma: Secondary | ICD-10-CM

## 2020-09-30 ENCOUNTER — Inpatient Hospital Stay: Payer: Medicare Other | Attending: Oncology

## 2020-09-30 DIAGNOSIS — D696 Thrombocytopenia, unspecified: Secondary | ICD-10-CM | POA: Insufficient documentation

## 2020-09-30 DIAGNOSIS — N189 Chronic kidney disease, unspecified: Secondary | ICD-10-CM | POA: Diagnosis not present

## 2020-09-30 DIAGNOSIS — Z794 Long term (current) use of insulin: Secondary | ICD-10-CM | POA: Diagnosis not present

## 2020-09-30 DIAGNOSIS — Z79899 Other long term (current) drug therapy: Secondary | ICD-10-CM | POA: Insufficient documentation

## 2020-09-30 DIAGNOSIS — Z7901 Long term (current) use of anticoagulants: Secondary | ICD-10-CM | POA: Diagnosis not present

## 2020-09-30 DIAGNOSIS — K7030 Alcoholic cirrhosis of liver without ascites: Secondary | ICD-10-CM | POA: Diagnosis not present

## 2020-09-30 DIAGNOSIS — E785 Hyperlipidemia, unspecified: Secondary | ICD-10-CM | POA: Diagnosis not present

## 2020-09-30 DIAGNOSIS — Z8505 Personal history of malignant neoplasm of liver: Secondary | ICD-10-CM | POA: Insufficient documentation

## 2020-09-30 DIAGNOSIS — G40909 Epilepsy, unspecified, not intractable, without status epilepticus: Secondary | ICD-10-CM | POA: Diagnosis not present

## 2020-09-30 DIAGNOSIS — I129 Hypertensive chronic kidney disease with stage 1 through stage 4 chronic kidney disease, or unspecified chronic kidney disease: Secondary | ICD-10-CM | POA: Diagnosis not present

## 2020-09-30 DIAGNOSIS — E1122 Type 2 diabetes mellitus with diabetic chronic kidney disease: Secondary | ICD-10-CM | POA: Diagnosis not present

## 2020-09-30 DIAGNOSIS — E538 Deficiency of other specified B group vitamins: Secondary | ICD-10-CM | POA: Diagnosis not present

## 2020-09-30 DIAGNOSIS — C220 Liver cell carcinoma: Secondary | ICD-10-CM

## 2020-09-30 LAB — CBC WITH DIFFERENTIAL/PLATELET
Abs Immature Granulocytes: 0.05 10*3/uL (ref 0.00–0.07)
Basophils Absolute: 0.1 10*3/uL (ref 0.0–0.1)
Basophils Relative: 1 %
Eosinophils Absolute: 0 10*3/uL (ref 0.0–0.5)
Eosinophils Relative: 0 %
HCT: 43.1 % (ref 36.0–46.0)
Hemoglobin: 14.2 g/dL (ref 12.0–15.0)
Immature Granulocytes: 1 %
Lymphocytes Relative: 12 %
Lymphs Abs: 1.2 10*3/uL (ref 0.7–4.0)
MCH: 30.4 pg (ref 26.0–34.0)
MCHC: 32.9 g/dL (ref 30.0–36.0)
MCV: 92.3 fL (ref 80.0–100.0)
Monocytes Absolute: 0.5 10*3/uL (ref 0.1–1.0)
Monocytes Relative: 5 %
Neutro Abs: 8.5 10*3/uL — ABNORMAL HIGH (ref 1.7–7.7)
Neutrophils Relative %: 81 %
Platelets: 179 10*3/uL (ref 150–400)
RBC: 4.67 MIL/uL (ref 3.87–5.11)
RDW: 13.8 % (ref 11.5–15.5)
WBC: 10.4 10*3/uL (ref 4.0–10.5)
nRBC: 0 % (ref 0.0–0.2)

## 2020-09-30 LAB — COMPREHENSIVE METABOLIC PANEL
ALT: 45 U/L — ABNORMAL HIGH (ref 0–44)
AST: 44 U/L — ABNORMAL HIGH (ref 15–41)
Albumin: 3.5 g/dL (ref 3.5–5.0)
Alkaline Phosphatase: 116 U/L (ref 38–126)
Anion gap: 7 (ref 5–15)
BUN: 30 mg/dL — ABNORMAL HIGH (ref 8–23)
CO2: 28 mmol/L (ref 22–32)
Calcium: 8.9 mg/dL (ref 8.9–10.3)
Chloride: 102 mmol/L (ref 98–111)
Creatinine, Ser: 1.17 mg/dL — ABNORMAL HIGH (ref 0.44–1.00)
GFR, Estimated: 51 mL/min — ABNORMAL LOW (ref >60)
Glucose, Bld: 269 mg/dL — ABNORMAL HIGH (ref 70–99)
Potassium: 4.1 mmol/L (ref 3.5–5.1)
Sodium: 137 mmol/L (ref 135–145)
Total Bilirubin: 1 mg/dL (ref 0.3–1.2)
Total Protein: 8.1 g/dL (ref 6.5–8.1)

## 2020-10-01 DIAGNOSIS — M19011 Primary osteoarthritis, right shoulder: Secondary | ICD-10-CM | POA: Diagnosis not present

## 2020-10-01 DIAGNOSIS — M7541 Impingement syndrome of right shoulder: Secondary | ICD-10-CM | POA: Diagnosis not present

## 2020-10-01 LAB — AFP TUMOR MARKER: AFP, Serum, Tumor Marker: 7.2 ng/mL (ref 0.0–9.2)

## 2020-10-02 ENCOUNTER — Other Ambulatory Visit: Payer: Self-pay

## 2020-10-02 ENCOUNTER — Other Ambulatory Visit: Payer: Medicare Other | Admitting: Primary Care

## 2020-10-02 DIAGNOSIS — Z515 Encounter for palliative care: Secondary | ICD-10-CM | POA: Diagnosis not present

## 2020-10-02 DIAGNOSIS — I5032 Chronic diastolic (congestive) heart failure: Secondary | ICD-10-CM | POA: Diagnosis not present

## 2020-10-02 DIAGNOSIS — K7030 Alcoholic cirrhosis of liver without ascites: Secondary | ICD-10-CM

## 2020-10-02 NOTE — Progress Notes (Signed)
[facility] Palliative [facility] Consult Note Telephone: [phone]  Fax: [phone]    Date of encounter: 10/02/20 PATIENT NAME: Adriana Miller [address] [place] Adriana Miller [id number]   [phone] (home)  DOB: April 26, 1955 MRN: [id number] PRIMARY CARE PROVIDER:    Carolynne Miller,  [place] [address] [phone]  REFERRING PROVIDER:   Carolynne Miller [place],  [place] [address] [phone]  RESPONSIBLE PARTY:    Contact Information     Name Relation Home Work [place] Brother [phone]  [phone]        I met face to face with patient in  home. Palliative Care was asked to follow this patient by consultation request of  Adriana Miller, Adriana Gaul, PA* to address advance care planning and complex medical decision making. This is a follow up visit.                                   ASSESSMENT AND PLAN / RECOMMENDATIONS:   Advance Care Planning/Goals of Care: Goals include to maximize quality of life and symptom management. Our advance care planning conversation included a discussion about:    The value and importance of advance care planning  Experiences with loved ones who have been seriously ill or have died  Exploration of personal, cultural or spiritual beliefs that might influence medical decisions  Exploration of goals of care in the event of a sudden injury or illness  Identification of a healthcare agent - brother  Review and updating of an  advance directive document . Decision not to resuscitate  but wants to be maintained until family can arrive if living. CODE STATUS: DNR , MOST  Symptom Management/Plan:  Nutrition: Has improved albumin but fears weight gain.  Endorses has gained 10 lbs but thinks it may be ascites.  Endorses difficulty getting up from supine. Recommend weights weekly. Endorses good appetite.   Edema: Neg in LE, endorses SOB with exertion and  fullness in her belly due to ascites. Has not had drained. Endorses fullness in upper thighs.   Pain in Shoulders: Recent injections in arms. Endorses improvement in pain level. Denies other pain. Is able to demonstrate improved ROM.  Insomnia: Endorses some insomnia, would like to d/c mirtazipine b/c thinks she is gaining wt. Could be fluid however.  May continue with trazodone if needed.   Follow up Palliative Care Visit: Palliative care will continue to follow for complex medical decision making, advance care planning, and clarification of goals. Return 6-8 weeks or prn.  I spent 50 minutes providing this consultation. More than 50% of the time in this consultation was spent in counseling and care coordination.  PPS: 50%  HOSPICE ELIGIBILITY/DIAGNOSIS: TBD  Chief Complaint: pain in shoulders, wt gain  HISTORY OF PRESENT ILLNESS:  Adriana Miller is a 65 y.o. year old female  with h/o  liver disease, cirrhosis and hepatocellular carcinoma , DM, shoulder immobility and pain, CHF. Recent joint injections have helped UE pain. Discussed recent labs that are improved in A1C, albumin and cancer markers. Pt wanted to revise aCP which we did.  History obtained from review of EMR, discussion with primary team, and interview with family, facility staff/caregiver and/or Adriana Miller.  I reviewed available labs, medications, imaging, studies and related documents from the EMR.  Records reviewed and summarized above.   ROS  General: NAD EYES: denies vision changes ENMT: denies dysphagia Cardiovascular: denies chest pain, denies DOE Pulmonary: denies cough, denies increased SOB Abdomen: endorses good appetite, denies constipation, endorses continence of bowel GU: denies dysuria, endorses continence of urine MSK:  denies increased weakness,  no falls reported Skin: denies rashes or wounds Neurological: denies pain, denies insomnia Psych: Endorses positive mood Heme/lymph/immuno: denies  bruises, abnormal bleeding  Physical Exam: Current and past weights: unavailable, needs to monitor Constitutional: NAD General: frail appearing EYES: anicteric sclera, lids intact, no discharge  ENMT: intact hearing, oral mucous membranes moist, dentition intact, dips snuff CV: no LE edema Pulmonary: no increased work of breathing, no cough, room air Abdomen: intake 100%,  no ascites GU: deferred MSK:  +, moves all extremities, ambulatory Skin: warm and dry, no rashes or wounds on visible skin Neuro:  + generalized weakness,  no cognitive impairment Psych: non-anxious affect, A and O x 3 Hem/lymph/immuno: no widespread bruising   Thank you for the opportunity to participate in the care of Adriana Miller.  The palliative care team will continue to follow. Please call our office at [phone] if we can be of additional assistance.   Jason Coop, NP , DNP, MPH, AGPCNP-BC, ACHPN  COVID-19 PATIENT SCREENING TOOL Asked and negative response unless otherwise noted:   Have you had symptoms of covid, tested positive or been in contact with someone with symptoms/positive test in the past 5-10 days?

## 2020-10-03 ENCOUNTER — Encounter: Payer: Self-pay | Admitting: Nurse Practitioner

## 2020-10-03 ENCOUNTER — Inpatient Hospital Stay (HOSPITAL_BASED_OUTPATIENT_CLINIC_OR_DEPARTMENT_OTHER): Payer: Medicare Other | Admitting: Nurse Practitioner

## 2020-10-03 ENCOUNTER — Telehealth: Payer: Self-pay

## 2020-10-03 VITALS — BP 149/67 | HR 52 | Temp 97.0°F | Resp 20 | Wt 182.6 lb

## 2020-10-03 DIAGNOSIS — D696 Thrombocytopenia, unspecified: Secondary | ICD-10-CM | POA: Diagnosis not present

## 2020-10-03 DIAGNOSIS — I129 Hypertensive chronic kidney disease with stage 1 through stage 4 chronic kidney disease, or unspecified chronic kidney disease: Secondary | ICD-10-CM | POA: Diagnosis not present

## 2020-10-03 DIAGNOSIS — E785 Hyperlipidemia, unspecified: Secondary | ICD-10-CM | POA: Diagnosis not present

## 2020-10-03 DIAGNOSIS — E538 Deficiency of other specified B group vitamins: Secondary | ICD-10-CM | POA: Diagnosis not present

## 2020-10-03 DIAGNOSIS — E1122 Type 2 diabetes mellitus with diabetic chronic kidney disease: Secondary | ICD-10-CM | POA: Diagnosis not present

## 2020-10-03 DIAGNOSIS — Z79899 Other long term (current) drug therapy: Secondary | ICD-10-CM | POA: Diagnosis not present

## 2020-10-03 DIAGNOSIS — K7460 Unspecified cirrhosis of liver: Secondary | ICD-10-CM | POA: Diagnosis not present

## 2020-10-03 DIAGNOSIS — Z794 Long term (current) use of insulin: Secondary | ICD-10-CM | POA: Diagnosis not present

## 2020-10-03 DIAGNOSIS — N189 Chronic kidney disease, unspecified: Secondary | ICD-10-CM | POA: Diagnosis not present

## 2020-10-03 DIAGNOSIS — Z7901 Long term (current) use of anticoagulants: Secondary | ICD-10-CM | POA: Diagnosis not present

## 2020-10-03 DIAGNOSIS — Z8505 Personal history of malignant neoplasm of liver: Secondary | ICD-10-CM | POA: Diagnosis not present

## 2020-10-03 DIAGNOSIS — C220 Liver cell carcinoma: Secondary | ICD-10-CM

## 2020-10-03 NOTE — Progress Notes (Signed)
Patient denies any concerns today.  

## 2020-10-03 NOTE — Progress Notes (Signed)
Hematology/Oncology Progress Note [facility] Telephone:([phone] [phone]   Patient Care Team: Carolynne Edouard as PCP - General (Physician Assistant) End, Harrell Gave, MD as PCP - Cardiology (Cardiology) Clent Jacks, RN as Oncology Nurse Navigator End, Harrell Gave, MD as Consulting Physician (Cardiology) Jason Coop, NP as Nurse Practitioner (Hospice and Palliative Medicine) Borders, Kirt Boys, NP as Nurse Practitioner (Hospice and Palliative Medicine) Earlie Server, MD as Consulting Physician (Oncology)  REFERRING PROVIDER: Mylinda Latina, PA*   CHIEF COMPLAINTS/REASON FOR VISIT:  Follow-up for [facility]  HISTORY OF PRESENTING ILLNESS:  Adriana Miller is a  65 y.o.  female with PMH listed below was seen in consultation at the request of  Mylinda Latina, PA*  for evaluation of elevated protein Patient has a history of alcoholic cirrhosis.  Recent admission due to bloody diarrhea and anemia due to acute blood loss.  She underwent EGD and colonoscopy.  EGD showed gastric bleeding erosion which was cauterized.  Evidence of portal hypertensive gastropathy.  Colonoscopy showed 2 small polyps that were removed. She has stopped drinking alcohol  Her blood work on 03/28/2019 showed protein level of 9.3, total bilirubin 2.4, # 07/23/2019 MRI liver done which showed 2 liver masses both with avid hyper enhancement, both considered LR-5 (definitely [place]).  I referred patient to have been in consultation with Dr. Sheilah Pigeon surgical oncology at [facility] for resectability. Patient was seen by Dr. Maudie Mercury on 08/20/2019.  Patient was deemed not a surgical candidate due to her multiple comorbidities, functional status.  She is not a transplant candidate due to her comorbidities and functional status.  Additionally it appears that patient does not want any major abdominal operations.  Patient was recommended to have local regional therapy.  Patient prefers to  stay local to proceed with her therapy.  She presented with a 2.0 cm segment II and 1.7 cm segment III mass and underwent split dose radiation segmentectomies on 10/02/19.     Her recovery was uneventful.  Initial post therapy liver MRI dated 01/03/2020 demonstrated an excellent response to therapy with no evidence of residual disease in the segment 2 lesion and equivocal residual enhancement in the segment 3 lesion.   MRI dated 04/14/2020 demonstrates continued treatment response to lesions in both the segment 2 and segment 3 with no evidence of residual viable tumor.  No new hepatic lesions are identified.   MRI 07/02/20: Treated hepatic segment II lesion  LR TR nonviable. Treated hepatic segment III lesion LR TR nonviable.  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 female with above history of [place] status post Y 90 returns to clinic for further evaluation and management of [place].  She continues to well and denies specific complaints.  Specifically, no fevers or chills.  No abnormal weight loss.  No nausea, vomiting, abdominal pain, constipation, or diarrhea.  No urinary complaints.  No chest pain or shortness of breath.  No abnormal bleeding or bruising.  Continues to feel well and denies other specific complaints.  Review of Systems  Constitutional:  Negative for appetite change, chills, fatigue, fever and unexpected weight change.  HENT:   Negative for hearing loss and voice change.   Eyes:  Negative for eye problems.  Respiratory:  Negative for chest tightness and cough.   Cardiovascular:  Negative for chest pain.  Gastrointestinal:  Negative for abdominal distention, abdominal pain  and blood in stool.  Endocrine: Negative for hot flashes.  Genitourinary:  Negative for difficulty urinating and frequency.   Musculoskeletal:  Positive for arthralgias.  Skin:  Negative  for itching and rash.  Neurological:  Negative for extremity weakness. Speech difficulty: . Hematological:  Negative for adenopathy. Does not bruise/bleed easily.  Psychiatric/Behavioral:  Negative for confusion.    MEDICAL HISTORY:  Past Medical History:  Diagnosis Date   B12 deficiency 04/05/2017   Blood in stool    Cerebral aneurysm    Chronic kidney disease    Diabetes mellitus without complication ([place])    Gastric erosion with bleeding    Hyperlipidemia    Hypertension    Lower extremity cellulitis 02/08/2019   Seizure disorder [facility])     SURGICAL HISTORY: Past Surgical History:  Procedure Laterality Date   CEREBRAL ANEURYSM REPAIR  1991   COLONOSCOPY WITH PROPOFOL N/A 02/12/2019   Procedure: COLONOSCOPY WITH PROPOFOL;  Surgeon: Lin Landsman, MD;  Location: [facility] ENDOSCOPY;  Service: Gastroenterology;  Laterality: N/A;   ESOPHAGOGASTRODUODENOSCOPY (EGD) WITH PROPOFOL N/A 02/12/2019   Procedure: ESOPHAGOGASTRODUODENOSCOPY (EGD) WITH PROPOFOL;  Surgeon: Lin Landsman, MD;  Location: [facility] ENDOSCOPY;  Service: Gastroenterology;  Laterality: N/A;   IR ANGIOGRAM SELECTIVE EACH ADDITIONAL VESSEL  09/21/2019   IR ANGIOGRAM SELECTIVE EACH ADDITIONAL VESSEL  09/21/2019   IR ANGIOGRAM SELECTIVE EACH ADDITIONAL VESSEL  09/21/2019   IR ANGIOGRAM SELECTIVE EACH ADDITIONAL VESSEL  09/21/2019   IR ANGIOGRAM SELECTIVE EACH ADDITIONAL VESSEL  10/02/2019   IR ANGIOGRAM VISCERAL SELECTIVE  09/21/2019   IR ANGIOGRAM VISCERAL SELECTIVE  09/21/2019   IR ANGIOGRAM VISCERAL SELECTIVE  10/02/2019   IR EMBO TUMOR ORGAN ISCHEMIA INFARCT INC GUIDE ROADMAPPING  10/02/2019   IR EMBO TUMOR ORGAN ISCHEMIA INFARCT INC GUIDE ROADMAPPING  10/02/2019   IR RADIOLOGIST EVAL & MGMT  08/28/2019   IR RADIOLOGIST EVAL & MGMT  01/30/2020   IR RADIOLOGIST EVAL & MGMT  04/30/2020   IR RADIOLOGIST EVAL & MGMT  07/30/2020   IR US GUIDE VASC ACCESS LEFT  09/21/2019   IR US GUIDE VASC ACCESS LEFT  10/02/2019    SOCIAL  HISTORY: Social History   Socioeconomic History   Marital status: Divorced    Spouse name: Not on file   Number of children: Not on file   Years of education: Not on file   Highest education level: Not on file  Occupational History   Not on file  Tobacco Use   Smoking status: Never   Smokeless tobacco: Current    Types: Snuff  Vaping Use   Vaping Use: Never used  Substance and Sexual Activity   Alcohol use: Not Currently    Alcohol/week: 14.0 standard drinks    Types: 14 Cans of beer per week    Comment: patient quit in october 2020   Drug use: No   Sexual activity: Not Currently  Other Topics Concern   Not on file  Social History Narrative   Not on file   Social Determinants of Health   Financial Resource Strain: Not on file  Food Insecurity: Not on file  Transportation Needs: Not on file  Physical Activity: Not on file  Stress: Not on file  Social Connections: Not on file  Intimate Partner Violence: Not on file    FAMILY HISTORY: Family History  Problem Relation Age of Onset   Heart attack Brother 30    ALLERGIES:  is allergic to aspirin.  MEDICATIONS:  Current Outpatient Medications  Medication  Sig Dispense Refill   ACCU-CHEK GUIDE test strip USE AS INSTRUCTED ONCE A DAILY DX E11.65 100 strip 1   allopurinol (ZYLOPRIM) 100 MG tablet Take 1 tablet (100 mg total) by mouth daily. 90 tablet 1   ELIQUIS 2.5 MG TABS tablet TAKE 1 TABLET BY MOUTH TWICE A DAY 180 tablet 1   escitalopram (LEXAPRO) 10 MG tablet TAKE 1 TABLET (10 MG TOTAL) BY MOUTH DAILY. FOR ANXIETY/SLEEP 90 tablet 2   furosemide (LASIX) 20 MG tablet TAKE 1 TABLET BY MOUTH EVERY DAY 90 tablet 0   glimepiride (AMARYL) 1 MG tablet Take 1 tablet (1 mg total) by mouth daily with breakfast. 30 tablet 2   insulin glargine, 1 Unit Dial, (TOUJEO SOLOSTAR) 300 UNIT/ML Solostar Pen Use 35 units sq in the evening with food 9 mL 3   Insulin Pen Needle 32G X 4 MM MISC Use as directed  With toujeo 100 each 0    metoprolol tartrate (LOPRESSOR) 25 MG tablet Take 0.5 tablets (12.5 mg total) by mouth 2 (two) times daily. 90 tablet 3   mirtazapine (REMERON) 7.5 MG tablet TAKE 1 TABLET BY MOUTH AT BEDTIME. 90 tablet 1   Safety Lancets 28G MISC Use as directed once a daily Dx e11.65 100 each 1   spironolactone (ALDACTONE) 25 MG tablet Take 1 tablet (25 mg total) by mouth daily. For cirrhosis of liver 90 tablet 2   traZODone (DESYREL) 50 MG tablet Take 0.5-1 tablets (25-50 mg total) by mouth at bedtime as needed for sleep. 30 tablet 2   No current facility-administered medications for this visit.     PHYSICAL EXAMINATION: ECOG PERFORMANCE STATUS: 1 - Symptomatic but completely ambulatory Vitals:   10/03/20 1418  BP: (!) 149/67  Pulse: (!) 52  Resp: 20  Temp: (!) 97 F (36.1 C)   Filed Weights   10/03/20 1418  Weight: 182 lb 9.6 oz (82.8 kg)    Physical Exam Constitutional:      General: She is not in acute distress.    Comments: Patient walks independantly  HENT:     Head: Normocephalic and atraumatic.  Eyes:     General: No scleral icterus. Cardiovascular:     Rate and Rhythm: Normal rate and regular rhythm.     Heart sounds: Normal heart sounds.  Pulmonary:     Effort: Pulmonary effort is normal. No respiratory distress.     Breath sounds: No wheezing.  Abdominal:     General: Bowel sounds are normal. There is no distension.     Palpations: Abdomen is soft.  Musculoskeletal:        General: No tenderness or deformity. Normal range of motion.     Cervical back: Normal range of motion and neck supple.  Skin:    General: Skin is warm and dry.     Findings: No erythema or rash.  Neurological:     Mental Status: She is alert and oriented to person, place, and time. Mental status is at baseline.     Cranial Nerves: No cranial nerve deficit.     Coordination: Coordination normal.  Psychiatric:        Mood and Affect: Mood normal.    LABORATORY DATA:  I have reviewed the data as  listed Lab Results  Component Value Date   WBC 10.4 09/30/2020   HGB 14.2 09/30/2020   HCT 43.1 09/30/2020   MCV 92.3 09/30/2020   PLT 179 09/30/2020   Recent Labs    11/22/19 1308 01/03/20  1015 04/17/20 1108 04/30/20 1343 07/02/20 1306 09/30/20 1608  NA 137 141  --  136 139 137  K 3.4* 4.3  --  4.2 3.4* 4.1  CL 99 98  --  97 102 102  CO2 26 33*  --  23 26 28   GLUCOSE 195* 298*  --  271* 206* 269*  BUN 24* 17  --  15 17 30*  CREATININE 1.26* 1.27*  --  1.22* 1.04* 1.17*  CALCIUM 9.1 9.6  --  8.8 8.9 8.9  GFRNONAA 44* 44*  --  46* 59* 51*  GFRAA 51* 51*  --  53*  --   --   PROT 8.7* 8.9* 8.5*  --  7.8 8.1  ALBUMIN 3.7 3.6 3.5  --  3.2* 3.5  AST 24 22 29   --  30 44*  ALT 13 15 19   --  20 45*  ALKPHOS 102 96 97  --  99 116  BILITOT 0.6 1.0 0.7  --  0.8 1.0  BILIDIR  --   --  0.2  --   --   --   IBILI  --   --  0.5  --   --   --     Iron/TIBC/Ferritin/ %Sat    Component Value Date/Time   IRON 76 02/11/2019 0616   TIBC 122 (L) 02/11/2019 0616   FERRITIN 427 (H) 02/11/2019 0616   IRONPCTSAT 62 (H) 02/11/2019 8341      RADIOGRAPHIC STUDIES: I have personally reviewed the radiological images as listed and agreed with the findings in the report. IR Radiologist Eval & Mgmt  Result Date: 08/08/2020 Please refer to notes tab for details about interventional procedure. (Op Note)      ASSESSMENT & PLAN:  No diagnosis found. 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  AFP tumor marker continues to decrease to 7.2.  Diagnosis #500.  Most recent surveillance MRI imaging demonstrated no evidence of residual, recurrent, or new disease.  Patient has not yet scheduled recommended June MRI as ordered by Dr. Laurence Ferrari. I  encouraged her to scheduled. Strongly encouraged surveillance imaging.   2.  Thrombocytopenia-platelets improved to 179.  Monitor.  3.  Liver cirrhosis-asymptomatic.  AST and ALT are slightly more elevated than 1 baseline.  Renal function is stable and within baseline.  Monitor.  Plan:  3 mo- lab (cbc, cmp, afp). Few days later- f/u with Dr. Tasia Catchings  All questions were answered. The patient knows to call the clinic with any problems questions or concerns.  Beckey Rutter, DNP, AGNP-C [place] at [facility] [phone] (clinic)

## 2020-10-24 ENCOUNTER — Ambulatory Visit
Admission: RE | Admit: 2020-10-24 | Discharge: 2020-10-24 | Disposition: A | Discharge status: Home / Self Care | Payer: Medicare Other | Source: Ambulatory Visit | Attending: Interventional Radiology | Admitting: Interventional Radiology

## 2020-10-24 ENCOUNTER — Other Ambulatory Visit: Payer: Self-pay

## 2020-10-24 DIAGNOSIS — C220 Liver cell carcinoma: Secondary | ICD-10-CM | POA: Insufficient documentation

## 2020-10-24 DIAGNOSIS — K8020 Calculus of gallbladder without cholecystitis without obstruction: Secondary | ICD-10-CM | POA: Diagnosis not present

## 2020-10-24 DIAGNOSIS — D7389 Other diseases of spleen: Secondary | ICD-10-CM | POA: Diagnosis not present

## 2020-10-24 DIAGNOSIS — Z8505 Personal history of malignant neoplasm of liver: Secondary | ICD-10-CM | POA: Diagnosis not present

## 2020-10-24 DIAGNOSIS — N281 Cyst of kidney, acquired: Secondary | ICD-10-CM | POA: Diagnosis not present

## 2020-10-24 IMAGING — MR MR ABDOMEN WO/W CM
18 series · 48 of 48 positions shown · IV contrast (gadavist)
Comparison: Abdominal MRI [DATE].

CLINICAL DATA: 65-year-old female with history of alcoholic
cirrhosis and multifocal unilocular hepatocellular carcinoma with 2
cm lesion in segment 2 of the liver and 1.7 cm lesion in segment 3
of the liver status post split dose radiation segmentectomies
[DATE].

EXAM:
MRI ABDOMEN WITHOUT AND WITH CONTRAST
TECHNIQUE: Multiplanar multisequence MR imaging of the abdomen was performed
both before and after the administration of intravenous contrast.
CONTRAST:  7.5mL GADAVIST GADOBUTROL 1 MMOL/ML IV SOLN

[Series 3: T2 · coronal · 6.0mm · 1.19mm/px · 2 of 30 slices shown (1 of 2)]
[im 1/30]
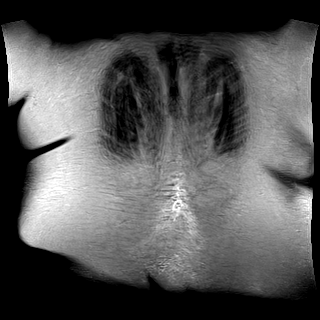
[im 30/30]
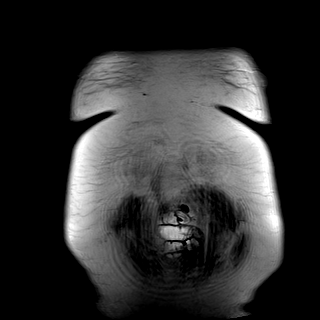

[Series 4: T2 · axial · 6.0mm · 1.19mm/px · z∈[-48,+175]mm · 2 of 32 slices shown (2 of 2)]
[im 1/32]
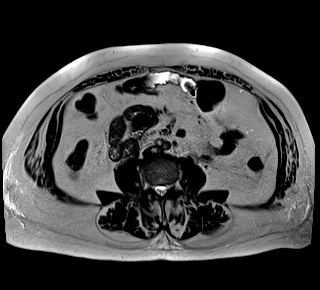
[im 32/32]
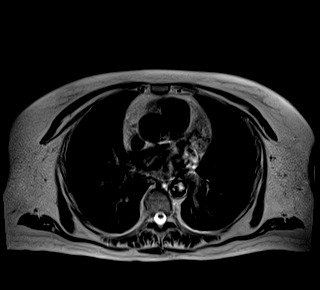

[Series 6: T2 fat-sat · axial · 6.0mm · 1.19mm/px · z∈[-43,+195]mm · 2 of 34 slices shown]
[im 1/34]
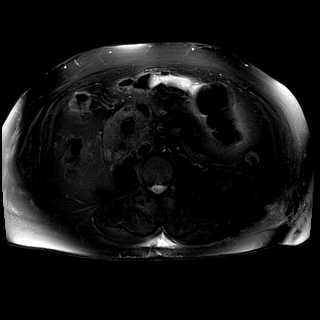
[im 34/34]
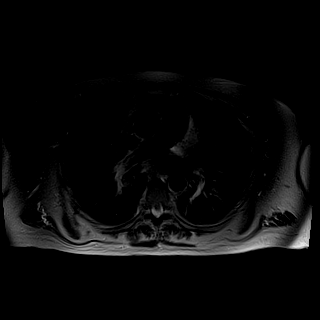

[Series 7: ax dwi_tracew · axial · 6.0mm · 1.42mm/px · z∈[-43,+195]mm · 4 of 102 slices shown]
[im 1/102]
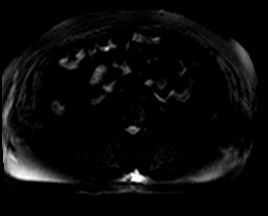
[im 34/102]
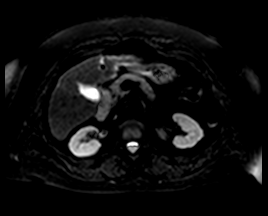
[im 68/102]
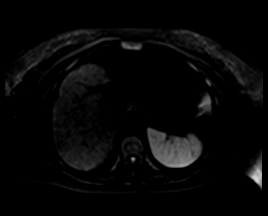
[im 102/102]
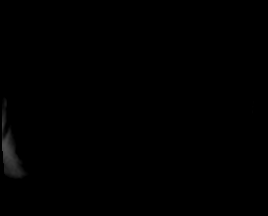

[Series 8: ax dwi_adc · axial · 6.0mm · 1.42mm/px · 1 of 34 slices shown]
[im 1/34]
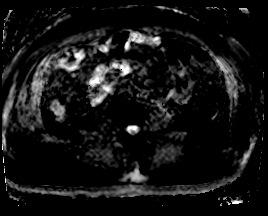

[Series 9: T1 · axial · 3.0mm · 1.19mm/px · z∈[-43,+170]mm · 3 of 72 slices shown (1 of 2)]
[im 1/72]
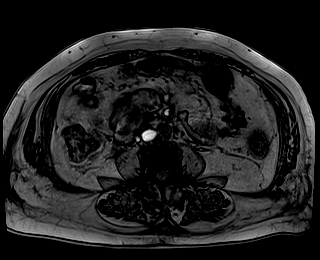
[im 36/72]
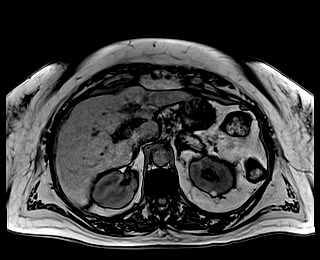
[im 72/72]
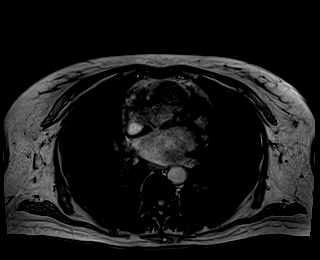

[Series 10: T1 · axial · 3.0mm · 1.19mm/px · z∈[-43,+170]mm · 3 of 72 slices shown (2 of 2)]
[im 1/72]
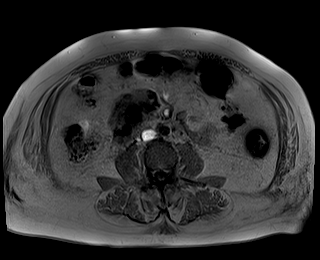
[im 36/72]
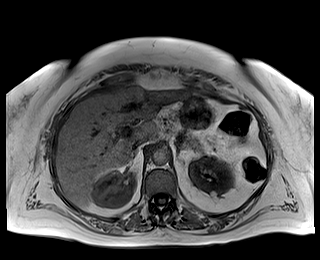
[im 72/72]
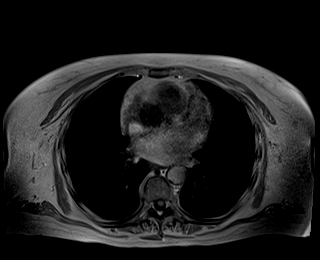

[Series 11: bSSFP · axial · 6.0mm · 0.74mm/px · 1 of 32 slices shown]
[im 1/32]
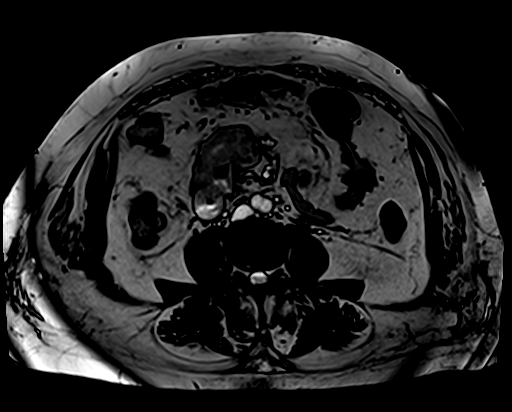

[Series 12: T1 dynamic fat-sat · axial · non-contrast · 3.0mm · 1.25mm/px · z∈[-55,+182]mm · 3 of 80 slices shown (1 of 5)]
[im 1/80]
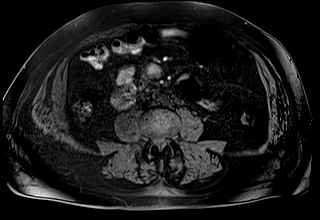
[im 40/80]
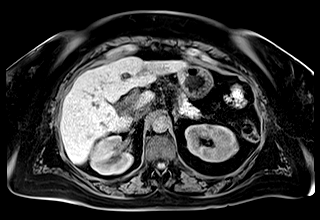
[im 80/80]
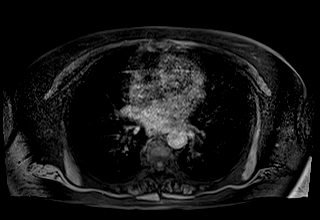

[Series 13: T1 dynamic fat-sat post-contrast · axial · 3.0mm · 1.25mm/px · z∈[-55,+182]mm · 3 of 80 slices shown (1 of 4)]
[im 1/80]
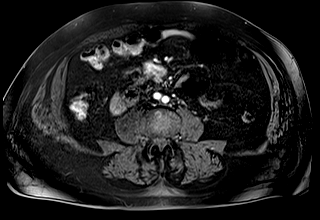
[im 40/80]
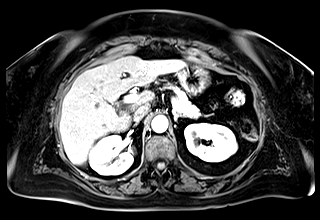
[im 80/80]
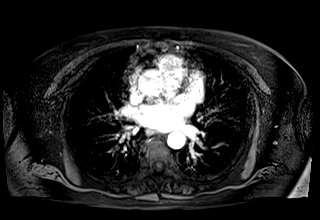

[Series 14: T1 dynamic fat-sat · axial · 3.0mm · 1.25mm/px · z∈[-55,+182]mm · 3 of 80 slices shown (2 of 5)]
[im 1/80]
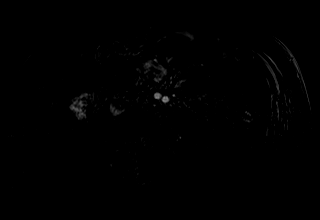
[im 40/80]
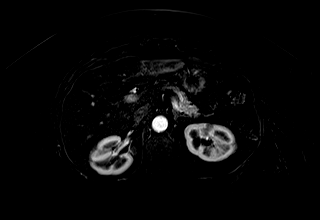
[im 80/80]
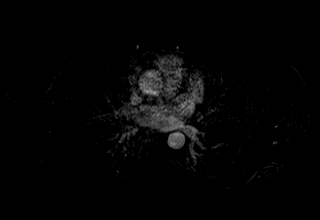

[Series 15: T1 dynamic fat-sat post-contrast · axial · 3.0mm · 1.25mm/px · z∈[-55,+182]mm · 3 of 80 slices shown (2 of 4)]
[im 1/80]
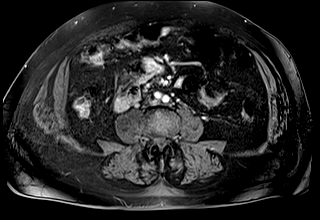
[im 40/80]
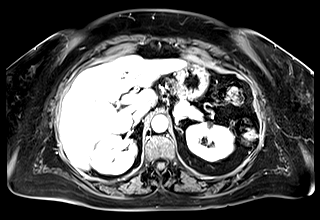
[im 80/80]
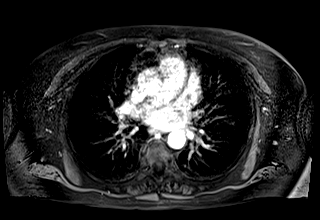

[Series 16: T1 dynamic fat-sat · axial · 3.0mm · 1.25mm/px · z∈[-55,+182]mm · 3 of 80 slices shown (3 of 5)]
[im 1/80]
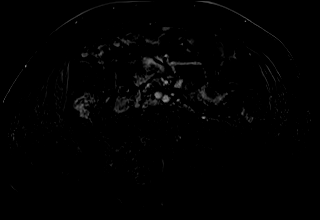
[im 40/80]
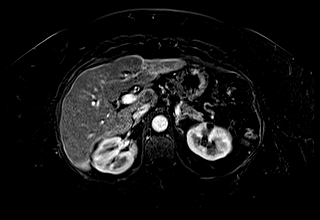
[im 80/80]
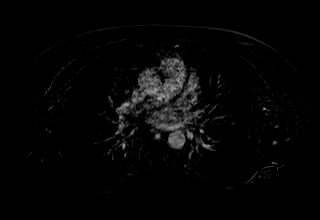

[Series 17: T1 dynamic fat-sat post-contrast · axial · 3.0mm · 1.25mm/px · z∈[-55,+182]mm · 3 of 80 slices shown (3 of 4)]
[im 1/80]
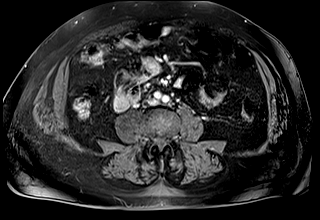
[im 40/80]
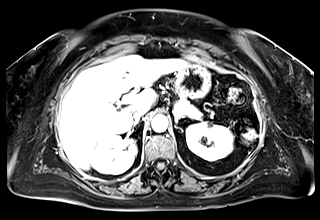
[im 80/80]
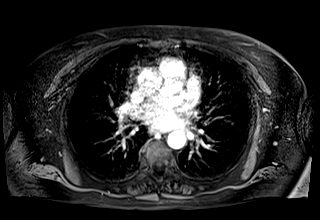

[Series 18: T1 dynamic fat-sat · axial · 3.0mm · 1.25mm/px · z∈[-55,+182]mm · 3 of 80 slices shown (4 of 5)]
[im 1/80]
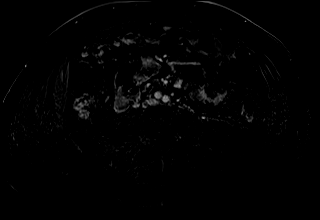
[im 40/80]
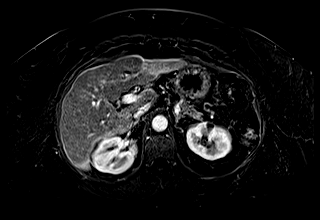
[im 80/80]
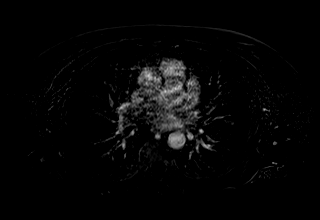

[Series 19: T1 dynamic post-contrast · coronal · 3.0mm · 1.31mm/px · 3 of 72 slices shown]
[im 1/72]
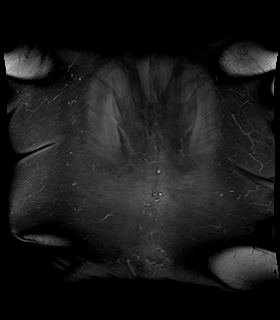
[im 36/72]
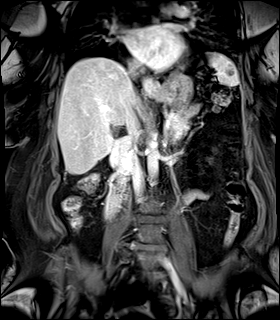
[im 72/72]
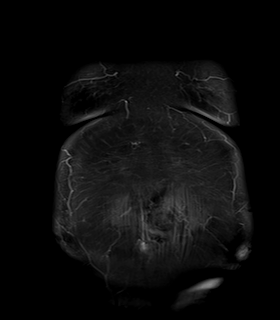

[Series 20: T1 dynamic fat-sat post-contrast · axial · 3.0mm · 1.25mm/px · z∈[-55,+182]mm · 3 of 80 slices shown (4 of 4)]
[im 1/80]
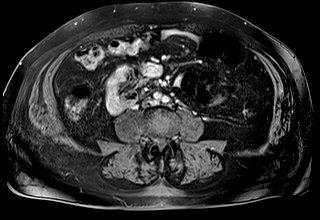
[im 40/80]
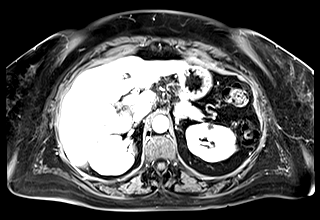
[im 80/80]
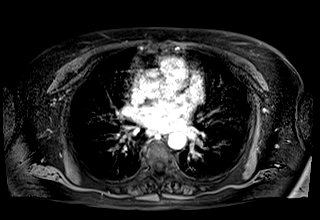

[Series 21: T1 dynamic fat-sat · axial · 3.0mm · 1.25mm/px · z∈[-55,+182]mm · 3 of 80 slices shown (5 of 5)]
[im 1/80]
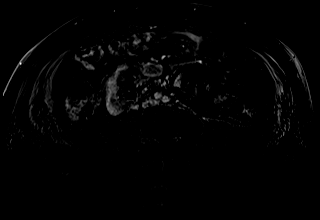
[im 40/80]
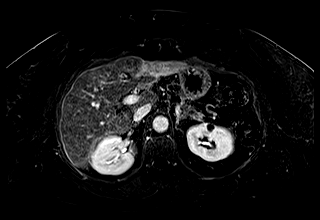
[im 80/80]
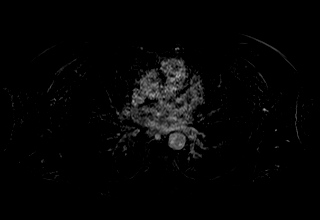

[48 of 48 positions shown; findings below may reference images not displayed]

FINDINGS: Lower chest: Unremarkable.

Hepatobiliary: Liver has a shrunken appearance and nodular contour,
similar to prior studies, indicative of underlying cirrhosis. There
is more focal atrophy throughout segments 2 and 3 of the left lobe
of the liver where there is also generalized T2 hyperintensity and a
pattern of lace-like hyperenhancement which persist on delayed
images, indicative of areas of more advanced fibrosis. Previously
treated lesion in segment 2 of the liver is a well-defined lesion
which is isointense on T1 and T2 weighted images without any
associated diffusion restriction, and with no arterial phase
hyperenhancement, compatible with a nonviable treated lesion.
Previously treated lesion in segment 3 of the liver is not
confidently identified, but no focal arterial phase hyperenhancement
is identified in this region to suggest residual viable tumor. No
new hyperenhancing hepatic lesions are noted elsewhere in the liver.
Trace amount of T2 hyperintense perihepatic fluid adjacent to the
right lobe of the liver, similar to prior examinations. No intra or
extrahepatic biliary ductal dilatation. Tiny filling defects are
noted lying dependently in the gallbladder. Gallbladder is not
distended. Gallbladder wall is normal in thickness with no
pericholecystic fluid or surrounding inflammatory changes.

Pancreas: No pancreatic mass. No pancreatic ductal dilatation. No
pancreatic or peripancreatic fluid collections or inflammatory
changes.

Spleen: Stable 1.2 cm T1 isointense, T2 hyperintense lesion with
some thin internal septations in the anterior aspect of the spleen,
similar to prior examinations, likely to represent a benign lesion
such as a small lymphangioma. No new splenic lesions are otherwise
noted.

Adrenals/Urinary Tract: 9 mm T1 hyperintense, T2 hypointense,
nonenhancing lesion in the posterior aspect of the upper pole of the
left kidney, compatible with a small proteinaceous/hemorrhagic cyst
(Bosniak class 2). In addition, in the anterior aspect of the upper
pole of the left kidney there is a 8 mm T1 hypointense, T2
hyperintense, nonenhancing lesion, compatible with a small simple
cyst (Bosniak class 1). Right kidney and bilateral adrenal glands
are normal in appearance. No hydroureteronephrosis in the visualized
portions of the abdomen.

Stomach/Bowel: Visualized portions are unremarkable.

Vascular/Lymphatic: No aneurysm identified in the visualized
abdominal vasculature. No lymphadenopathy noted in the visualized
abdomen.

Other:  Trace volume of perihepatic ascites again noted.

Musculoskeletal: No aggressive appearing osseous lesions are noted
in the visualized portions of the skeleton.
IMPRESSION: 1. Treated lesions in segments 2 and 3 of the liver again
demonstrate no evidence of recurrence/viability. No new hepatic
lesions are otherwise noted.
2. Morphologic changes in the liver indicative of underlying
cirrhosis with more severe hepatic fibrosis involving segments 2 and
3 of the liver.
3. Cholelithiasis without evidence of acute cholecystitis at this
time.
4. Bosniak class 1 and Bosniak class 2 cysts in the left kidney, as
above.
5. Small lesion in the spleen, stable compared to prior
examinations, presumably a benign lesions such as a small
lymphangioma.

## 2020-10-24 MED ORDER — GADOBUTROL 1 MMOL/ML IV SOLN
7.5000 mL | Freq: Once | INTRAVENOUS | Status: AC | PRN
  Administered 2020-10-24: 7.5 mL via INTRAVENOUS

## 2020-10-28 ENCOUNTER — Ambulatory Visit
Admission: RE | Admit: 2020-10-28 | Discharge: 2020-10-28 | Disposition: A | Discharge status: Home / Self Care | Payer: Medicare Other | Source: Ambulatory Visit | Attending: Interventional Radiology | Admitting: Interventional Radiology

## 2020-10-28 ENCOUNTER — Other Ambulatory Visit: Payer: Self-pay

## 2020-10-28 ENCOUNTER — Encounter: Payer: Self-pay | Admitting: *Deleted

## 2020-10-28 DIAGNOSIS — C220 Liver cell carcinoma: Secondary | ICD-10-CM

## 2020-10-28 HISTORY — PX: IR RADIOLOGIST EVAL & MGMT: IMG5224

## 2020-10-28 NOTE — Progress Notes (Signed)
Chief Complaint: Patient was consulted remotely today (TeleHealth) for EtOH cirrhosis complicated by hepatocellular cancer at the request of Annaly Skop K.    Referring Physician(s): Dr. Earlie Server  History of Present Illness: Adriana Miller is a 65 y.o. 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  She presented with a 2.0 cm segment II and 1.7 cm segment III mass and underwent split dose radiation segmentectomies on 10/02/19.     Her recovery was uneventful.  Initial post therapy liver MRI dated 01/03/2020 demonstrated an excellent response to therapy with no evidence of residual disease in the segment 2 lesion and equivocal residual enhancement in the segment 3 lesion.   MRI dated 04/14/2020 demonstrates continued treatment response to lesions in both the segment 2 and segment 3 with no evidence of residual viable tumor.  No new hepatic lesions are identified.   MRI 07/02/20: Treated hepatic segment II lesion  LR TR nonviable. Treated hepatic segment III lesion LR TR nonviable.  No new suspicious hepatic lesions.  MRI 10/25/20: Treated lesions in segments 2 and 3 of the liver again demonstrate no evidence of recurrence/viability. No new hepatic lesions are otherwise noted.    Adriana Miller reports that she is doing well and has no active complaints at this time.  Past Medical History:  Diagnosis Date   B12 deficiency 04/05/2017   Blood in stool    Cerebral aneurysm    Chronic kidney disease    Diabetes mellitus without complication ([place])    Gastric erosion with bleeding    Hyperlipidemia    Hypertension    Lower extremity cellulitis 02/08/2019   Seizure disorder [facility])     Past Surgical History:  Procedure Laterality Date   CEREBRAL ANEURYSM REPAIR  1991   COLONOSCOPY WITH PROPOFOL N/A 02/12/2019   Procedure: COLONOSCOPY WITH PROPOFOL;  Surgeon: Lin Landsman, MD;  Location: [facility] ENDOSCOPY;  Service:  Gastroenterology;  Laterality: N/A;   ESOPHAGOGASTRODUODENOSCOPY (EGD) WITH PROPOFOL N/A 02/12/2019   Procedure: ESOPHAGOGASTRODUODENOSCOPY (EGD) WITH PROPOFOL;  Surgeon: Lin Landsman, MD;  Location: [facility] ENDOSCOPY;  Service: Gastroenterology;  Laterality: N/A;   IR ANGIOGRAM SELECTIVE EACH ADDITIONAL VESSEL  09/21/2019   IR ANGIOGRAM SELECTIVE EACH ADDITIONAL VESSEL  09/21/2019   IR ANGIOGRAM SELECTIVE EACH ADDITIONAL VESSEL  09/21/2019   IR ANGIOGRAM SELECTIVE EACH ADDITIONAL VESSEL  09/21/2019   IR ANGIOGRAM SELECTIVE EACH ADDITIONAL VESSEL  10/02/2019   IR ANGIOGRAM VISCERAL SELECTIVE  09/21/2019   IR ANGIOGRAM VISCERAL SELECTIVE  09/21/2019   IR ANGIOGRAM VISCERAL SELECTIVE  10/02/2019   IR EMBO TUMOR ORGAN ISCHEMIA INFARCT INC GUIDE ROADMAPPING  10/02/2019   IR EMBO TUMOR ORGAN ISCHEMIA INFARCT INC GUIDE ROADMAPPING  10/02/2019   IR RADIOLOGIST EVAL & MGMT  08/28/2019   IR RADIOLOGIST EVAL & MGMT  01/30/2020   IR RADIOLOGIST EVAL & MGMT  04/30/2020   IR RADIOLOGIST EVAL & MGMT  07/30/2020   IR US GUIDE VASC ACCESS LEFT  09/21/2019   IR US GUIDE VASC ACCESS LEFT  10/02/2019    Allergies: Aspirin  Medications: Prior to Admission medications   Medication Sig Start Date End Date Taking? Authorizing Provider  ACCU-CHEK GUIDE test strip USE AS INSTRUCTED ONCE A DAILY DX E11.65 07/28/20   McDonough, Si Gaul, PA-C  allopurinol (ZYLOPRIM) 100 MG tablet Take 1 tablet (100 mg total) by mouth daily. 08/25/20   McDonough, Lauren K, PA-C  ELIQUIS 2.5 MG TABS tablet TAKE 1 TABLET BY MOUTH TWICE A DAY  06/16/20   Lavera Guise, MD  escitalopram (LEXAPRO) 10 MG tablet TAKE 1 TABLET (10 MG TOTAL) BY MOUTH DAILY. FOR ANXIETY/SLEEP 07/28/20   Lavera Guise, MD  furosemide (LASIX) 20 MG tablet TAKE 1 TABLET BY MOUTH EVERY DAY 07/28/20   End, Harrell Gave, MD  glimepiride (AMARYL) 1 MG tablet Take 1 tablet (1 mg total) by mouth daily with breakfast. 08/25/20   McDonough, Si Gaul, PA-C  insulin glargine, 1 Unit Dial,  (TOUJEO SOLOSTAR) 300 UNIT/ML Solostar Pen Use 35 units sq in the evening with food 04/01/20   Ronnell Freshwater, NP  Insulin Pen Needle 32G X 4 MM MISC Use as directed  With toujeo 08/25/20   McDonough, Lauren K, PA-C  metoprolol tartrate (LOPRESSOR) 25 MG tablet Take 0.5 tablets (12.5 mg total) by mouth 2 (two) times daily. 07/04/20   McDonough, Si Gaul, PA-C  mirtazapine (REMERON) 7.5 MG tablet TAKE 1 TABLET BY MOUTH AT BEDTIME. 06/16/20   Lavera Guise, MD  Safety Lancets 28G MISC Use as directed once a daily Dx e11.65 02/04/20   Luiz Ochoa, NP  spironolactone (ALDACTONE) 25 MG tablet Take 1 tablet (25 mg total) by mouth daily. For cirrhosis of liver 07/04/20   McDonough, Si Gaul, PA-C  traZODone (DESYREL) 50 MG tablet Take 0.5-1 tablets (25-50 mg total) by mouth at bedtime as needed for sleep. 09/05/19   Borders, Kirt Boys, NP     Family History  Problem Relation Age of Onset   Heart attack Brother 44    Social History   Socioeconomic History   Marital status: Divorced    Spouse name: Not on file   Number of children: Not on file   Years of education: Not on file   Highest education level: Not on file  Occupational History   Not on file  Tobacco Use   Smoking status: Never   Smokeless tobacco: Current    Types: Snuff  Vaping Use   Vaping Use: Never used  Substance and Sexual Activity   Alcohol use: Not Currently    Alcohol/week: 14.0 standard drinks    Types: 14 Cans of beer per week    Comment: patient quit in october 2020   Drug use: No   Sexual activity: Not Currently  Other Topics Concern   Not on file  Social History Narrative   Not on file   Social Determinants of Health   Financial Resource Strain: Not on file  Food Insecurity: Not on file  Transportation Needs: Not on file  Physical Activity: Not on file  Stress: Not on file  Social Connections: Not on file    ECOG Status: 1 - Symptomatic but completely ambulatory  Review of Systems  Review of  Systems: A 12 point ROS discussed and pertinent positives are indicated in the HPI above.  All other systems are negative.  Physical Exam No direct physical exam was performed (except for noted visual exam findings with Video Visits).    Vital Signs: There were no vitals taken for this visit.  Imaging: MR ABDOMEN WWO CONTRAST  Result Date: 10/25/2020 CLINICAL DATA:  65 year old female with history of alcoholic cirrhosis and multifocal unilocular hepatocellular carcinoma with 2 cm lesion in segment 2 of the liver and 1.7 cm lesion in segment 3 of the liver status post split dose radiation segmentectomies 10/02/2019. EXAM: MRI ABDOMEN WITHOUT AND WITH CONTRAST TECHNIQUE: Multiplanar multisequence MR imaging of the abdomen was performed both before and after the administration of intravenous  contrast. CONTRAST:  7.99mL GADAVIST GADOBUTROL 1 MMOL/ML IV SOLN COMPARISON:  Abdominal MRI 07/02/2020. FINDINGS: Lower chest: Unremarkable. Hepatobiliary: Liver has a shrunken appearance and nodular contour, similar to prior studies, indicative of underlying cirrhosis. There is more focal atrophy throughout segments 2 and 3 of the left lobe of the liver where there is also generalized T2 hyperintensity and a pattern of lace-like hyperenhancement which persist on delayed images, indicative of areas of more advanced fibrosis. Previously treated lesion in segment 2 of the liver is a well-defined lesion which is isointense on T1 and T2 weighted images without any associated diffusion restriction, and with no arterial phase hyperenhancement, compatible with a nonviable treated lesion. Previously treated lesion in segment 3 of the liver is not confidently identified, but no focal arterial phase hyperenhancement is identified in this region to suggest residual viable tumor. No new hyperenhancing hepatic lesions are noted elsewhere in the liver. Trace amount of T2 hyperintense perihepatic fluid adjacent to the right lobe of the  liver, similar to prior examinations. No intra or extrahepatic biliary ductal dilatation. Tiny filling defects are noted lying dependently in the gallbladder. Gallbladder is not distended. Gallbladder wall is normal in thickness with no pericholecystic fluid or surrounding inflammatory changes. Pancreas: No pancreatic mass. No pancreatic ductal dilatation. No pancreatic or peripancreatic fluid collections or inflammatory changes. Spleen: Stable 1.2 cm T1 isointense, T2 hyperintense lesion with some thin internal septations in the anterior aspect of the spleen, similar to prior examinations, likely to represent a benign lesion such as a small lymphangioma. No new splenic lesions are otherwise noted. Adrenals/Urinary Tract: 9 mm T1 hyperintense, T2 hypointense, nonenhancing lesion in the posterior aspect of the upper pole of the left kidney, compatible with a small proteinaceous/hemorrhagic cyst (Bosniak class 2). In addition, in the anterior aspect of the upper pole of the left kidney there is a 8 mm T1 hypointense, T2 hyperintense, nonenhancing lesion, compatible with a small simple cyst (Bosniak class 1). Right kidney and bilateral adrenal glands are normal in appearance. No hydroureteronephrosis in the visualized portions of the abdomen. Stomach/Bowel: Visualized portions are unremarkable. Vascular/Lymphatic: No aneurysm identified in the visualized abdominal vasculature. No lymphadenopathy noted in the visualized abdomen. Other:  Trace volume of perihepatic ascites again noted. Musculoskeletal: No aggressive appearing osseous lesions are noted in the visualized portions of the skeleton. IMPRESSION: 1. Treated lesions in segments 2 and 3 of the liver again demonstrate no evidence of recurrence/viability. No new hepatic lesions are otherwise noted. 2. Morphologic changes in the liver indicative of underlying cirrhosis with more severe hepatic fibrosis involving segments 2 and 3 of the liver. 3. Cholelithiasis  without evidence of acute cholecystitis at this time. 4. Bosniak class 1 and Bosniak class 2 cysts in the left kidney, as above. 5. Small lesion in the spleen, stable compared to prior examinations, presumably a benign lesions such as a small lymphangioma. Electronically Signed   By: Vinnie Langton M.D.   On: 10/25/2020 08:41    Labs:  CBC: Recent Labs    01/03/20 1015 04/30/20 1343 07/02/20 1306 09/30/20 1608  WBC 5.3 8.7 7.1 10.4  HGB 13.2 13.4 12.1 14.2  HCT 39.1 39.0 37.3 43.1  PLT 139* 175 140* 179    COAGS: No results for input(s): INR, APTT in the last 8760 hours.  BMP: Recent Labs    11/22/19 1308 01/03/20 1015 04/30/20 1343 07/02/20 1306 09/30/20 1608  NA 137 141 136 139 137  K 3.4* 4.3 4.2 3.4* 4.1  CL  99 98 97 102 102  CO2 26 33* 23 26 28   GLUCOSE 195* 298* 271* 206* 269*  BUN 24* 17 15 17  30*  CALCIUM 9.1 9.6 8.8 8.9 8.9  CREATININE 1.26* 1.27* 1.22* 1.04* 1.17*  GFRNONAA 44* 44* 46* 59* 51*  GFRAA 51* 51* 53*  --   --     LIVER FUNCTION TESTS: Recent Labs    01/03/20 1015 04/17/20 1108 07/02/20 1306 09/30/20 1608  BILITOT 1.0 0.7 0.8 1.0  AST 22 29 30  44*  ALT 15 19 20  45*  ALKPHOS 96 97 99 116  PROT 8.9* 8.5* 7.8 8.1  ALBUMIN 3.6 3.5 3.2* 3.5    TUMOR MARKERS: No results for input(s): AFPTM, CEA, CA199, CHROMGRNA in the last 8760 hours.  Assessment and Plan:  Doing well 1 year status post segment 2 and segment 3 transarterial radio embolization segmentectomy's.  No evidence of residual, recurrent or new disease at this time.  We will continue active surveillance given underlying cirrhosis and risk for new hepatocellular carcinoma development.  1.)  Repeat MRI and accompanying clinic visit in 3 months.    Electronically Signed: Criselda Peaches 10/28/2020, 11:35 AM   I spent a total of 10 Minutes in remote  clinical consultation, greater than 50% of which was counseling/coordinating care for hepatocellular cancer.    Visit type:  Audio only (telephone). Audio (no video) only due to patient preference. Alternative for in-person consultation at [facility], [address] [place], [place], [place]. This visit type was conducted due to national recommendations for restrictions regarding the COVID-19 Pandemic (e.g. social distancing).  This format is felt to be most appropriate for this patient at this time.  All issues noted in this document were discussed and addressed.

## 2020-11-02 ENCOUNTER — Other Ambulatory Visit: Payer: Self-pay | Admitting: Internal Medicine

## 2020-11-02 ENCOUNTER — Other Ambulatory Visit: Payer: Self-pay | Admitting: Physician Assistant

## 2020-11-02 DIAGNOSIS — G2581 Restless legs syndrome: Secondary | ICD-10-CM

## 2020-11-02 DIAGNOSIS — E1129 Type 2 diabetes mellitus with other diabetic kidney complication: Secondary | ICD-10-CM

## 2020-11-02 DIAGNOSIS — IMO0002 Reserved for concepts with insufficient information to code with codable children: Secondary | ICD-10-CM

## 2020-11-25 ENCOUNTER — Ambulatory Visit: Payer: Medicare Other | Admitting: Nurse Practitioner

## 2020-12-16 ENCOUNTER — Other Ambulatory Visit: Payer: Self-pay | Admitting: Physician Assistant

## 2020-12-16 DIAGNOSIS — E1129 Type 2 diabetes mellitus with other diabetic kidney complication: Secondary | ICD-10-CM

## 2020-12-16 DIAGNOSIS — IMO0002 Reserved for concepts with insufficient information to code with codable children: Secondary | ICD-10-CM

## 2020-12-26 ENCOUNTER — Ambulatory Visit: Payer: Medicare Other | Admitting: Internal Medicine

## 2020-12-26 NOTE — Progress Notes (Deleted)
Follow-up Outpatient Visit Date: 12/26/2020  Primary Care Provider: Carolynne Edouard [address] [place] [address]  Chief Complaint: ***  HPI:  [name] is a 65 y.o. female with history of paroxysmal atrial fibrillation and flutter, hypertension, hyperlipidemia, diabetes mellitus, chronic kidney disease, cirrhosis complicated by GI bleed secondary to erosive gastropathy (01/2019), seizure disorder, and cerebral aneurysm status post repair, who presents for follow-up of atrial fibrillation.  I last saw her in January, at which time she was feeling well.  Blood sugars have been elevated but improved with reinitiation of long-acting insulin.  She denied bleeding.  CBC showed normal/stable hemoglobin.  --------------------------------------------------------------------------------------------------  Past Medical History:  Diagnosis Date   B12 deficiency 04/05/2017   Blood in stool    Cerebral aneurysm    Chronic kidney disease    Diabetes mellitus without complication ([place])    Gastric erosion with bleeding    Hyperlipidemia    Hypertension    Lower extremity cellulitis 02/08/2019   Seizure disorder [facility])    Past Surgical History:  Procedure Laterality Date   CEREBRAL ANEURYSM REPAIR  1991   COLONOSCOPY WITH PROPOFOL N/A 02/12/2019   Procedure: COLONOSCOPY WITH PROPOFOL;  Surgeon: Lin Landsman, MD;  Location: [facility] ENDOSCOPY;  Service: Gastroenterology;  Laterality: N/A;   ESOPHAGOGASTRODUODENOSCOPY (EGD) WITH PROPOFOL N/A 02/12/2019   Procedure: ESOPHAGOGASTRODUODENOSCOPY (EGD) WITH PROPOFOL;  Surgeon: Lin Landsman, MD;  Location: [facility] ENDOSCOPY;  Service: Gastroenterology;  Laterality: N/A;   IR ANGIOGRAM SELECTIVE EACH ADDITIONAL VESSEL  09/21/2019   IR ANGIOGRAM SELECTIVE EACH ADDITIONAL VESSEL  09/21/2019   IR ANGIOGRAM SELECTIVE EACH ADDITIONAL VESSEL  09/21/2019   IR ANGIOGRAM SELECTIVE EACH ADDITIONAL VESSEL  09/21/2019   IR ANGIOGRAM SELECTIVE EACH  ADDITIONAL VESSEL  10/02/2019   IR ANGIOGRAM VISCERAL SELECTIVE  09/21/2019   IR ANGIOGRAM VISCERAL SELECTIVE  09/21/2019   IR ANGIOGRAM VISCERAL SELECTIVE  10/02/2019   IR EMBO TUMOR ORGAN ISCHEMIA INFARCT INC GUIDE ROADMAPPING  10/02/2019   IR EMBO TUMOR ORGAN ISCHEMIA INFARCT INC GUIDE ROADMAPPING  10/02/2019   IR RADIOLOGIST EVAL & MGMT  08/28/2019   IR RADIOLOGIST EVAL & MGMT  01/30/2020   IR RADIOLOGIST EVAL & MGMT  04/30/2020   IR RADIOLOGIST EVAL & MGMT  07/30/2020   IR RADIOLOGIST EVAL & MGMT  10/28/2020   IR US GUIDE VASC ACCESS LEFT  09/21/2019   IR US GUIDE VASC ACCESS LEFT  10/02/2019    No outpatient medications have been marked as taking for the 12/26/20 encounter (Appointment) with Adriana Miller, Adriana Gave, MD.    Allergies: Aspirin  Social History   Tobacco Use   Smoking status: Never   Smokeless tobacco: Current    Types: Snuff  Vaping Use   Vaping Use: Never used  Substance Use Topics   Alcohol use: Not Currently    Alcohol/week: 14.0 standard drinks    Types: 14 Cans of beer per week    Comment: patient quit in october 2020   Drug use: No    Family History  Problem Relation Age of Onset   Heart attack Brother 44    Review of Systems: A 12-system review of systems was performed and was negative except as noted in the HPI.  --------------------------------------------------------------------------------------------------  Physical Exam: There were no vitals taken for this visit.  General:  NAD. Neck: No JVD or HJR. Lungs: Clear to auscultation bilaterally without wheezes or crackles. Heart: Regular rate and rhythm without murmurs, rubs, or gallops. Abdomen: Soft, nontender, nondistended. Extremities:  No lower extremity edema.  EKG:  ***  Lab Results  Component Value Date   WBC 10.4 09/30/2020   HGB 14.2 09/30/2020   HCT 43.1 09/30/2020   MCV 92.3 09/30/2020   PLT 179 09/30/2020    Lab Results  Component Value Date   NA 137 09/30/2020   K 4.1 09/30/2020    CL 102 09/30/2020   CO2 28 09/30/2020   BUN 30 (H) 09/30/2020   CREATININE 1.17 (H) 09/30/2020   GLUCOSE 269 (H) 09/30/2020   ALT 45 (H) 09/30/2020    Lab Results  Component Value Date   CHOL 134 01/27/2018   HDL 80 01/27/2018   LDLCALC 42 01/27/2018   TRIG 60 01/27/2018    --------------------------------------------------------------------------------------------------  ASSESSMENT AND PLAN: Adriana Miller Adriana Cawley, MD 12/26/2020 1:16 PM

## 2020-12-29 ENCOUNTER — Encounter: Payer: Self-pay | Admitting: Internal Medicine

## 2020-12-30 ENCOUNTER — Other Ambulatory Visit: Payer: Self-pay | Admitting: Interventional Radiology

## 2020-12-30 DIAGNOSIS — C220 Liver cell carcinoma: Secondary | ICD-10-CM

## 2021-01-05 ENCOUNTER — Inpatient Hospital Stay: Payer: Medicare Other | Attending: Oncology

## 2021-01-06 ENCOUNTER — Other Ambulatory Visit: Payer: Self-pay | Admitting: Physician Assistant

## 2021-01-06 ENCOUNTER — Other Ambulatory Visit: Payer: Self-pay | Admitting: Internal Medicine

## 2021-01-06 DIAGNOSIS — I4891 Unspecified atrial fibrillation: Secondary | ICD-10-CM

## 2021-01-07 ENCOUNTER — Inpatient Hospital Stay: Payer: Medicare Other | Admitting: Oncology

## 2021-02-06 ENCOUNTER — Ambulatory Visit
Admission: RE | Admit: 2021-02-06 | Discharge: 2021-02-06 | Disposition: A | Discharge status: Home / Self Care | Payer: Medicare Other | Source: Ambulatory Visit | Attending: Interventional Radiology | Admitting: Interventional Radiology

## 2021-02-06 ENCOUNTER — Other Ambulatory Visit: Payer: Self-pay

## 2021-02-06 DIAGNOSIS — K769 Liver disease, unspecified: Secondary | ICD-10-CM | POA: Diagnosis not present

## 2021-02-06 DIAGNOSIS — C220 Liver cell carcinoma: Secondary | ICD-10-CM | POA: Insufficient documentation

## 2021-02-06 DIAGNOSIS — K7460 Unspecified cirrhosis of liver: Secondary | ICD-10-CM | POA: Diagnosis not present

## 2021-02-06 DIAGNOSIS — K7290 Hepatic failure, unspecified without coma: Secondary | ICD-10-CM | POA: Diagnosis not present

## 2021-02-06 IMAGING — MR MR ABDOMEN WO/W CM
18 series · 48 of 48 positions shown · IV contrast (7.5ml Gadavist)
Comparison: [DATE], [DATE]

CLINICAL DATA: Cirrhosis, history of HCC, status radiation therapy
1,021

EXAM:
MRI ABDOMEN WITHOUT AND WITH CONTRAST
TECHNIQUE: Multiplanar multisequence MR imaging of the abdomen was performed
both before and after the administration of intravenous contrast.
CONTRAST:  7.5mL GADAVIST GADOBUTROL 1 MMOL/ML IV SOLN

[Series 3: T2 · coronal · 6.0mm · 1.19mm/px · 2 of 33 slices shown (1 of 2)]
[im 1/33]
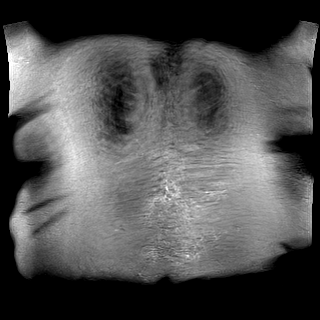
[im 33/33]
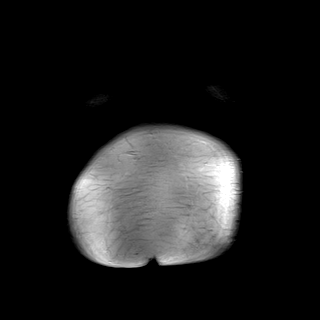

[Series 4: T2 · axial · 6.0mm · 1.19mm/px · z∈[+11,+234]mm · 2 of 32 slices shown (2 of 2)]
[im 1/32]
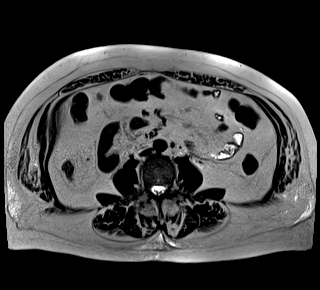
[im 32/32]
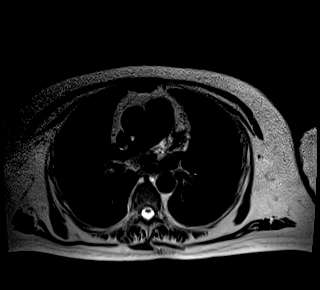

[Series 6: T2 fat-sat · axial · 6.0mm · 1.19mm/px · z∈[+4,+242]mm · 2 of 34 slices shown]
[im 1/34]
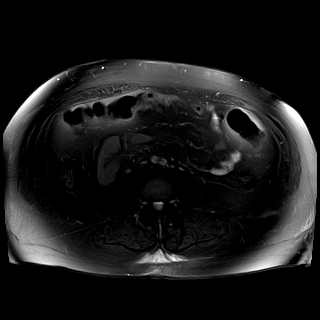
[im 34/34]
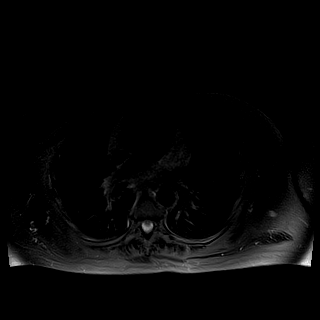

[Series 7: ax dwi_tracew · axial · 6.0mm · 1.42mm/px · z∈[+4,+242]mm · 4 of 102 slices shown]
[im 1/102]
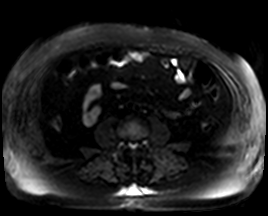
[im 34/102]
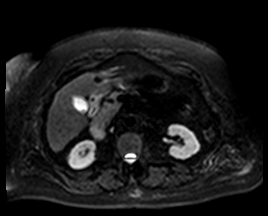
[im 68/102]
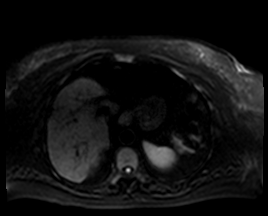
[im 102/102]
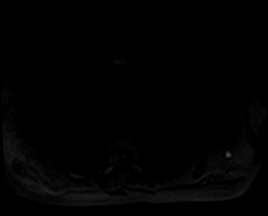

[Series 8: ax dwi_adc · axial · 6.0mm · 1.42mm/px · 1 of 34 slices shown]
[im 1/34]
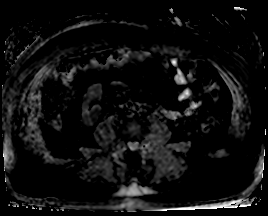

[Series 9: in & out · axial · 3.0mm · 1.19mm/px · z∈[+16,+229]mm · 3 of 72 slices shown (1 of 2)]
[im 1/72]
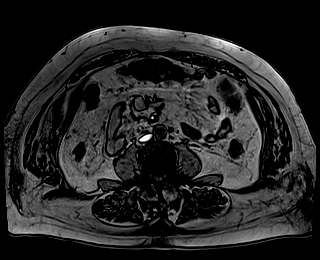
[im 36/72]
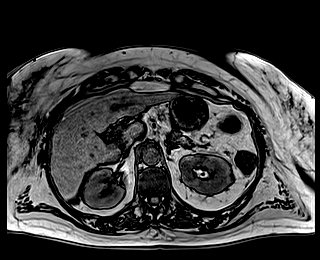
[im 72/72]
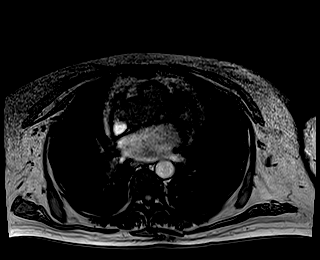

[Series 10: in & out · axial · 3.0mm · 1.19mm/px · z∈[+16,+229]mm · 3 of 72 slices shown (2 of 2)]
[im 1/72]
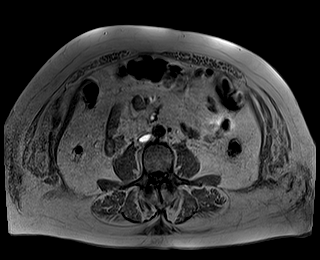
[im 36/72]
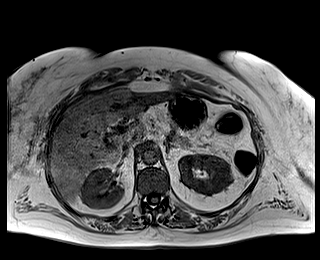
[im 72/72]
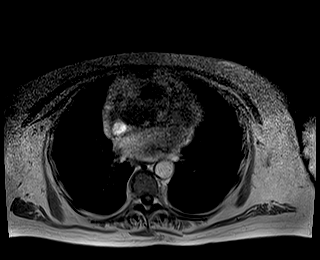

[Series 11: bSSFP · axial · 6.0mm · 0.74mm/px · 1 of 32 slices shown]
[im 1/32]
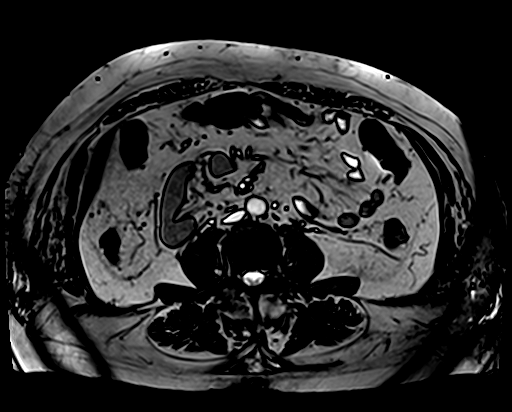

[Series 12: T1 dynamic fat-sat · axial · non-contrast · 3.0mm · 1.19mm/px · z∈[+16,+229]mm · 3 of 72 slices shown (1 of 5)]
[im 1/72]
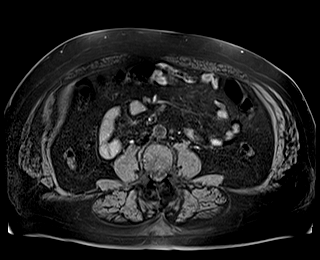
[im 36/72]
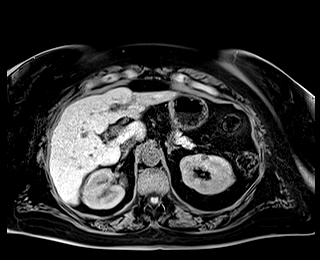
[im 72/72]
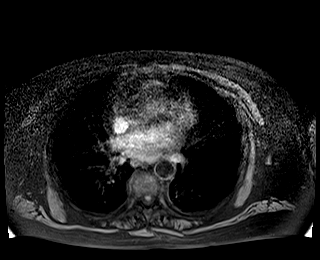

[Series 13: T1 dynamic fat-sat post-contrast · axial · 3.0mm · 1.19mm/px · z∈[+16,+229]mm · 3 of 72 slices shown (1 of 4)]
[im 1/72]
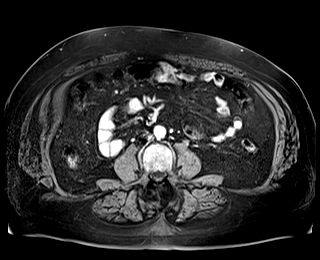
[im 36/72]
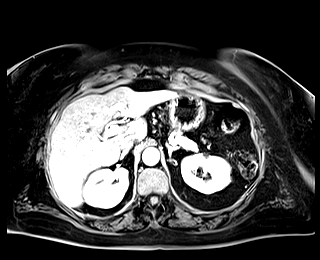
[im 72/72]
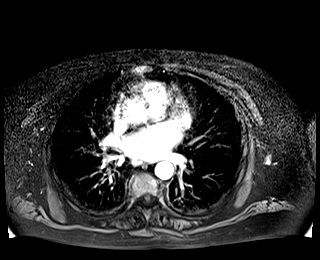

[Series 14: T1 dynamic fat-sat · axial · 3.0mm · 1.19mm/px · z∈[+16,+229]mm · 3 of 72 slices shown (2 of 5)]
[im 1/72]
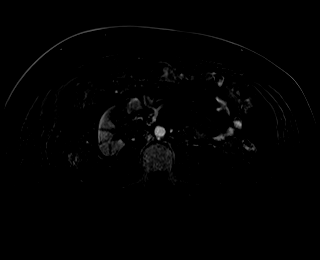
[im 36/72]
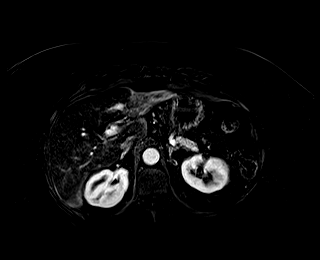
[im 72/72]
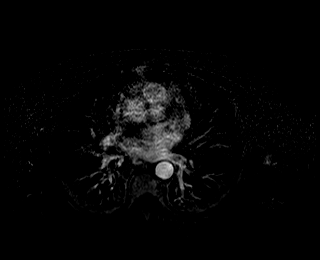

[Series 15: T1 dynamic fat-sat post-contrast · axial · 3.0mm · 1.19mm/px · z∈[+16,+229]mm · 3 of 72 slices shown (2 of 4)]
[im 1/72]
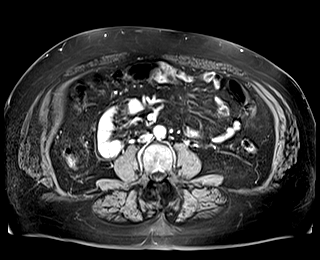
[im 36/72]
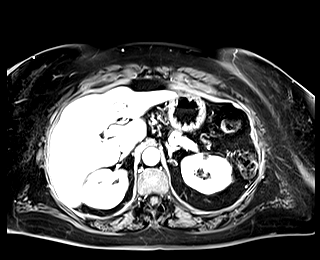
[im 72/72]
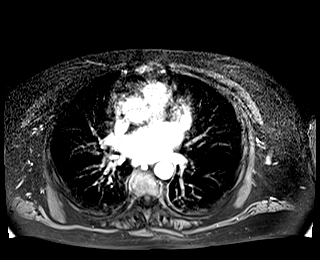

[Series 16: T1 dynamic fat-sat · axial · 3.0mm · 1.19mm/px · z∈[+16,+229]mm · 3 of 72 slices shown (3 of 5)]
[im 1/72]
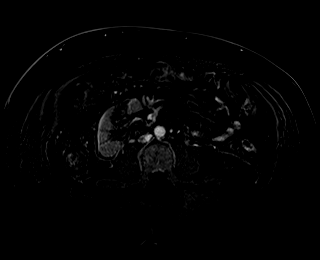
[im 36/72]
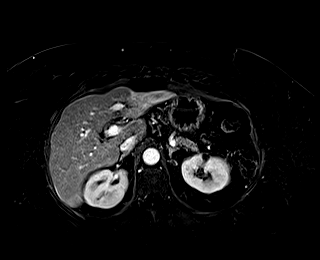
[im 72/72]
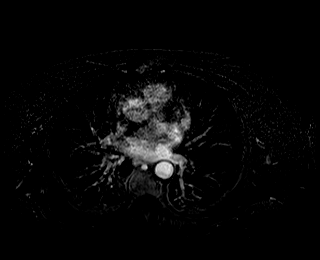

[Series 17: T1 dynamic fat-sat post-contrast · axial · 3.0mm · 1.19mm/px · z∈[+16,+229]mm · 3 of 72 slices shown (3 of 4)]
[im 1/72]
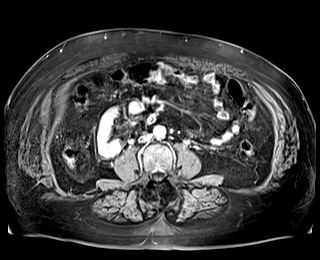
[im 36/72]
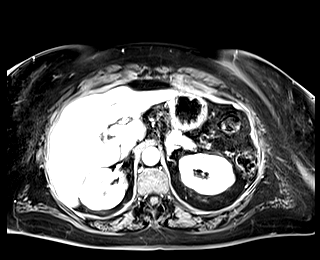
[im 72/72]
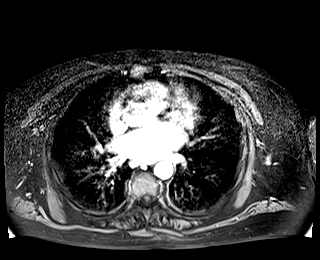

[Series 18: T1 dynamic fat-sat · axial · 3.0mm · 1.19mm/px · z∈[+16,+229]mm · 3 of 72 slices shown (4 of 5)]
[im 1/72]
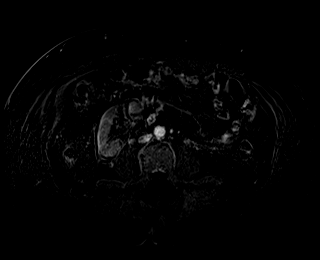
[im 36/72]
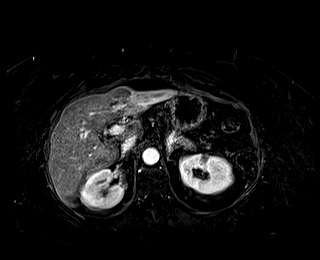
[im 72/72]
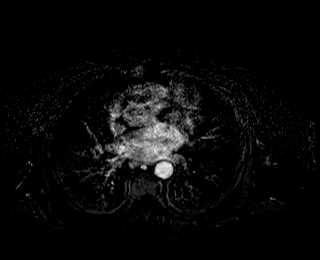

[Series 19: T1 dynamic post-contrast · coronal · 3.0mm · 1.31mm/px · 3 of 72 slices shown]
[im 1/72]
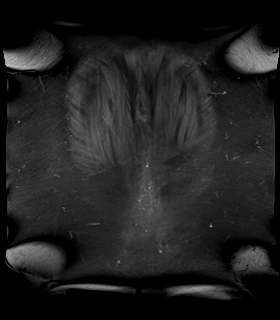
[im 36/72]
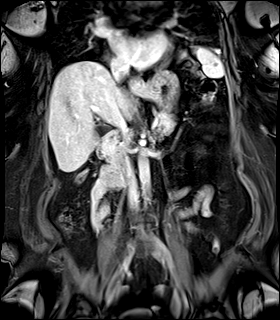
[im 72/72]
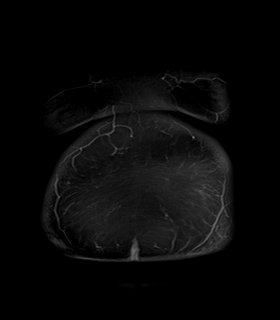

[Series 20: T1 dynamic fat-sat post-contrast · axial · 3.0mm · 1.19mm/px · z∈[+16,+229]mm · 3 of 72 slices shown (4 of 4)]
[im 1/72]
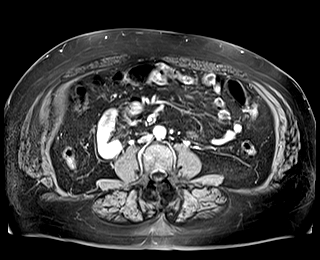
[im 36/72]
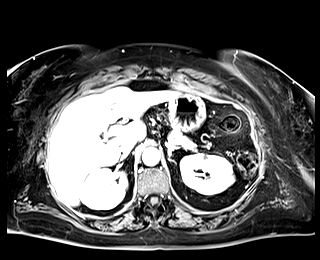
[im 72/72]
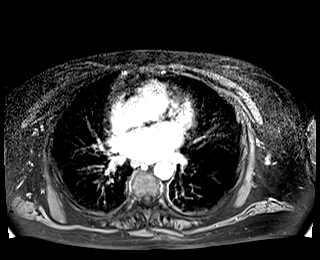

[Series 21: T1 dynamic fat-sat · axial · 3.0mm · 1.19mm/px · z∈[+16,+229]mm · 3 of 72 slices shown (5 of 5)]
[im 1/72]
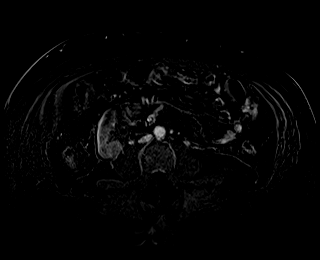
[im 36/72]
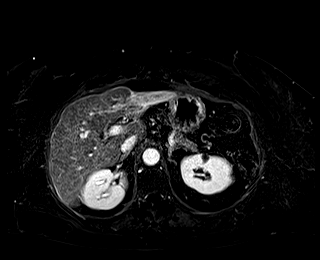
[im 72/72]
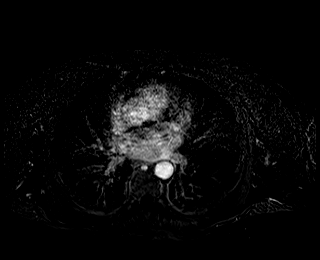

[48 of 48 positions shown; findings below may reference images not displayed]

FINDINGS: Lower chest: No acute findings.

Hepatobiliary: Coarse, nodular cirrhotic morphology of the liver.
Atrophic, mildly hyperenhancing appearance of the left lobe of the
liver, hepatic segments II and III, similar in appearance prior
examination. Redemonstrated, well-circumscribed, nonenhancing lesion
of hepatic segment II, measuring no greater than 1.1 x 0.7 cm
(series 13, image 32). Minimal sludge or tiny gallstones in the
gallbladder (series, image 51). No biliary ductal dilatation.

Pancreas: No mass, inflammatory changes, or other parenchymal
abnormality identified. No pancreatic ductal dilatation.

Spleen:  Within normal limits in size and appearance.

Adrenals/Urinary Tract: No masses identified. Benign hemorrhagic or
proteinaceous cysts of the kidneys bilaterally. No evidence of
hydronephrosis.

Stomach/Bowel: Visualized portions within the abdomen are
unremarkable.

Vascular/Lymphatic: No pathologically enlarged lymph nodes
identified. No abdominal aortic aneurysm demonstrated.

Other:  None.

Musculoskeletal: No suspicious bone lesions identified.
IMPRESSION: 1. Coarse, nodular cirrhotic morphology of the liver. Atrophic,
mildly hyperenhancing appearance of the left lobe of the liver,
hepatic segments II and III, similar in appearance prior
examination.
2. Redemonstrated, well-circumscribed, nonenhancing lesion of
hepatic segment II, consistent with treated, nonviable
hepatocellular carcinoma following embolization and radiotherapy.
3. Additional lesion of hepatic segment III previously reported is
not appreciated on this examination.
4. No new liver lesions or suspicious contrast enhancement.
5. Minimal sludge or tiny gallstones in the gallbladder.

## 2021-02-06 MED ORDER — GADOBUTROL 1 MMOL/ML IV SOLN
7.5000 mL | Freq: Once | INTRAVENOUS | Status: AC | PRN
  Administered 2021-02-06: 7.5 mL via INTRAVENOUS

## 2021-02-12 ENCOUNTER — Ambulatory Visit
Admission: RE | Admit: 2021-02-12 | Discharge: 2021-02-12 | Disposition: A | Discharge status: Home / Self Care | Payer: Medicare Other | Source: Ambulatory Visit | Attending: Interventional Radiology | Admitting: Interventional Radiology

## 2021-02-12 ENCOUNTER — Other Ambulatory Visit: Payer: Self-pay

## 2021-02-12 DIAGNOSIS — C220 Liver cell carcinoma: Secondary | ICD-10-CM

## 2021-02-24 ENCOUNTER — Other Ambulatory Visit: Payer: Medicare Other | Admitting: Primary Care

## 2021-02-24 ENCOUNTER — Encounter: Payer: Self-pay | Admitting: *Deleted

## 2021-02-24 ENCOUNTER — Ambulatory Visit
Admission: RE | Admit: 2021-02-24 | Discharge: 2021-02-24 | Disposition: A | Discharge status: Home / Self Care | Payer: Medicare Other | Source: Ambulatory Visit | Attending: Interventional Radiology | Admitting: Interventional Radiology

## 2021-02-24 ENCOUNTER — Other Ambulatory Visit: Payer: Self-pay

## 2021-02-24 DIAGNOSIS — R531 Weakness: Secondary | ICD-10-CM | POA: Diagnosis not present

## 2021-02-24 DIAGNOSIS — K7030 Alcoholic cirrhosis of liver without ascites: Secondary | ICD-10-CM

## 2021-02-24 DIAGNOSIS — Z515 Encounter for palliative care: Secondary | ICD-10-CM | POA: Diagnosis not present

## 2021-02-24 DIAGNOSIS — C220 Liver cell carcinoma: Secondary | ICD-10-CM

## 2021-02-24 HISTORY — PX: IR RADIOLOGIST EVAL & MGMT: IMG5224

## 2021-02-24 NOTE — Progress Notes (Signed)
Chief Complaint: Patient was seen in follow-up remotely today (TeleHealth) for EtOH cirrhosis complicated by hepatocellular cancer  at the request of Adriana Miller K.    Referring Physician(s): Dr. Earlie Server  History of Present Illness: Adriana Miller is a 65 y.o. 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  She presented with a 2.0 cm segment II and 1.7 cm segment III mass and underwent split dose radiation segmentectomies on 10/02/19.     Her recovery was uneventful.  Initial post therapy liver MRI dated 01/03/2020 demonstrated an excellent response to therapy with no evidence of residual disease in the segment 2 lesion and equivocal residual enhancement in the segment 3 lesion.   MRI dated 04/14/2020 demonstrates continued treatment response to lesions in both the segment 2 and segment 3 with no evidence of residual viable tumor.  No new hepatic lesions are identified.   MRI 07/02/20: Treated hepatic segment II lesion  LR TR nonviable. Treated hepatic segment III lesion LR TR nonviable.  No new suspicious hepatic lesions.   MRI 10/25/20: Treated lesions in segments 2 and 3 of the liver again demonstrate no evidence of recurrence/viability. No new hepatic lesions are otherwise noted.    MRI 02/06/21: Redemonstrated, well-circumscribed, nonenhancing lesion of hepatic segment II, consistent with treated, nonviable hepatocellular carcinoma following embolization and radiotherapy.  Additional lesion of hepatic segment III previously reported is not appreciated on this examination.   Adriana Miller reports that she is doing well and has no active complaints at this time.  Past Medical History:  Diagnosis Date   B12 deficiency 04/05/2017   Blood in stool    Cerebral aneurysm    Chronic kidney disease    Diabetes mellitus without complication ([place])    Gastric erosion with bleeding    Hyperlipidemia    Hypertension    Lower  extremity cellulitis 02/08/2019   Seizure disorder [facility])     Past Surgical History:  Procedure Laterality Date   CEREBRAL ANEURYSM REPAIR  1991   COLONOSCOPY WITH PROPOFOL N/A 02/12/2019   Procedure: COLONOSCOPY WITH PROPOFOL;  Surgeon: Lin Landsman, MD;  Location: [facility] ENDOSCOPY;  Service: Gastroenterology;  Laterality: N/A;   ESOPHAGOGASTRODUODENOSCOPY (EGD) WITH PROPOFOL N/A 02/12/2019   Procedure: ESOPHAGOGASTRODUODENOSCOPY (EGD) WITH PROPOFOL;  Surgeon: Lin Landsman, MD;  Location: [facility] ENDOSCOPY;  Service: Gastroenterology;  Laterality: N/A;   IR ANGIOGRAM SELECTIVE EACH ADDITIONAL VESSEL  09/21/2019   IR ANGIOGRAM SELECTIVE EACH ADDITIONAL VESSEL  09/21/2019   IR ANGIOGRAM SELECTIVE EACH ADDITIONAL VESSEL  09/21/2019   IR ANGIOGRAM SELECTIVE EACH ADDITIONAL VESSEL  09/21/2019   IR ANGIOGRAM SELECTIVE EACH ADDITIONAL VESSEL  10/02/2019   IR ANGIOGRAM VISCERAL SELECTIVE  09/21/2019   IR ANGIOGRAM VISCERAL SELECTIVE  09/21/2019   IR ANGIOGRAM VISCERAL SELECTIVE  10/02/2019   IR EMBO TUMOR ORGAN ISCHEMIA INFARCT INC GUIDE ROADMAPPING  10/02/2019   IR EMBO TUMOR ORGAN ISCHEMIA INFARCT INC GUIDE ROADMAPPING  10/02/2019   IR RADIOLOGIST EVAL & MGMT  08/28/2019   IR RADIOLOGIST EVAL & MGMT  01/30/2020   IR RADIOLOGIST EVAL & MGMT  04/30/2020   IR RADIOLOGIST EVAL & MGMT  07/30/2020   IR RADIOLOGIST EVAL & MGMT  10/28/2020   IR US GUIDE VASC ACCESS LEFT  09/21/2019   IR US GUIDE VASC ACCESS LEFT  10/02/2019    Allergies: Aspirin  Medications: Prior to Admission medications   Medication Sig Start Date End Date Taking? Authorizing Provider  ACCU-CHEK GUIDE test  strip USE AS INSTRUCTED ONCE A DAILY DX E11.65 01/06/21   McDonough, Si Gaul, PA-C  allopurinol (ZYLOPRIM) 100 MG tablet Take 1 tablet (100 mg total) by mouth daily. 08/25/20   McDonough, Si Gaul, PA-C  BD PEN NEEDLE NANO 2ND GEN 32G X 4 MM MISC USE AS DIRECTED WITH TOUJEO 12/16/20   McDonough, Lauren K, PA-C  ELIQUIS 2.5 MG TABS  tablet TAKE 1 TABLET BY MOUTH TWICE A DAY 01/06/21   Lavera Guise, MD  escitalopram (LEXAPRO) 10 MG tablet TAKE 1 TABLET (10 MG TOTAL) BY MOUTH DAILY. FOR ANXIETY/SLEEP 07/28/20   Lavera Guise, MD  furosemide (LASIX) 20 MG tablet Take 1 tablet (20 mg total) by mouth daily. PLEAS CALL OFFICE TO SCHEDULE APPOINTMENT FOR FURTHER REFILLS. THANK YOU! 11/03/20   End, Harrell Gave, MD  glimepiride (AMARYL) 1 MG tablet TAKE 1 TABLET BY MOUTH DAILY WITH BREAKFAST. 11/03/20   Lavera Guise, MD  insulin glargine, 1 Unit Dial, (TOUJEO SOLOSTAR) 300 UNIT/ML Solostar Pen Use 35 units sq in the evening with food 04/01/20   Ronnell Freshwater, NP  metoprolol tartrate (LOPRESSOR) 25 MG tablet Take 0.5 tablets (12.5 mg total) by mouth 2 (two) times daily. 07/04/20   McDonough, Si Gaul, PA-C  mirtazapine (REMERON) 7.5 MG tablet TAKE 1 TABLET BY MOUTH EVERYDAY AT BEDTIME 11/02/20   Lavera Guise, MD  Safety Lancets 28G MISC Use as directed once a daily Dx e11.65 02/04/20   Luiz Ochoa, NP  spironolactone (ALDACTONE) 25 MG tablet Take 1 tablet (25 mg total) by mouth daily. For cirrhosis of liver 07/04/20   McDonough, Si Gaul, PA-C  traZODone (DESYREL) 50 MG tablet Take 0.5-1 tablets (25-50 mg total) by mouth at bedtime as needed for sleep. 09/05/19   Borders, Kirt Boys, NP     Family History  Problem Relation Age of Onset   Heart attack Brother 12    Social History   Socioeconomic History   Marital status: Divorced    Spouse name: Not on file   Number of children: Not on file   Years of education: Not on file   Highest education level: Not on file  Occupational History   Not on file  Tobacco Use   Smoking status: Never   Smokeless tobacco: Current    Types: Snuff  Vaping Use   Vaping Use: Never used  Substance and Sexual Activity   Alcohol use: Not Currently    Alcohol/week: 14.0 standard drinks    Types: 14 Cans of beer per week    Comment: patient quit in october 2020   Drug use: No   Sexual  activity: Not Currently  Other Topics Concern   Not on file  Social History Narrative   Not on file   Social Determinants of Health   Financial Resource Strain: Not on file  Food Insecurity: Not on file  Transportation Needs: Not on file  Physical Activity: Not on file  Stress: Not on file  Social Connections: Not on file    ECOG Status: 1 - Symptomatic but completely ambulatory  Review of Systems  Review of Systems: A 12 point ROS discussed and pertinent positives are indicated in the HPI above.  All other systems are negative.  Physical Exam No direct physical exam was performed (except for noted visual exam findings with Video Visits).    Vital Signs: There were no vitals taken for this visit.  Imaging: MR ABDOMEN WWO CONTRAST  Result Date: 02/09/2021 CLINICAL DATA:  Cirrhosis, history of HCC, status radiation therapy 1,021 EXAM: MRI ABDOMEN WITHOUT AND WITH CONTRAST TECHNIQUE: Multiplanar multisequence MR imaging of the abdomen was performed both before and after the administration of intravenous contrast. CONTRAST:  7.91mL GADAVIST GADOBUTROL 1 MMOL/ML IV SOLN COMPARISON:  10/24/2020, 07/02/2020 FINDINGS: Lower chest: No acute findings. Hepatobiliary: Coarse, nodular cirrhotic morphology of the liver. Atrophic, mildly hyperenhancing appearance of the left lobe of the liver, hepatic segments II and III, similar in appearance prior examination. Redemonstrated, well-circumscribed, nonenhancing lesion of hepatic segment II, measuring no greater than 1.1 x 0.7 cm (series 13, image 32). Minimal sludge or tiny gallstones in the gallbladder (series, image 51). No biliary ductal dilatation. Pancreas: No mass, inflammatory changes, or other parenchymal abnormality identified. No pancreatic ductal dilatation. Spleen:  Within normal limits in size and appearance. Adrenals/Urinary Tract: No masses identified. Benign hemorrhagic or proteinaceous cysts of the kidneys bilaterally. No evidence of  hydronephrosis. Stomach/Bowel: Visualized portions within the abdomen are unremarkable. Vascular/Lymphatic: No pathologically enlarged lymph nodes identified. No abdominal aortic aneurysm demonstrated. Other:  None. Musculoskeletal: No suspicious bone lesions identified. IMPRESSION: 1. Coarse, nodular cirrhotic morphology of the liver. Atrophic, mildly hyperenhancing appearance of the left lobe of the liver, hepatic segments II and III, similar in appearance prior examination. 2. Redemonstrated, well-circumscribed, nonenhancing lesion of hepatic segment II, consistent with treated, nonviable hepatocellular carcinoma following embolization and radiotherapy. 3. Additional lesion of hepatic segment III previously reported is not appreciated on this examination. 4. No new liver lesions or suspicious contrast enhancement. 5. Minimal sludge or tiny gallstones in the gallbladder. Electronically Signed   By: Delanna Ahmadi M.D.   On: 02/09/2021 15:18    Labs:  CBC: Recent Labs    04/30/20 1343 07/02/20 1306 09/30/20 1608  WBC 8.7 7.1 10.4  HGB 13.4 12.1 14.2  HCT 39.0 37.3 43.1  PLT 175 140* 179    COAGS: No results for input(s): INR, APTT in the last 8760 hours.  BMP: Recent Labs    04/30/20 1343 07/02/20 1306 09/30/20 1608  NA 136 139 137  K 4.2 3.4* 4.1  CL 97 102 102  CO2 23 26 28   GLUCOSE 271* 206* 269*  BUN 15 17 30*  CALCIUM 8.8 8.9 8.9  CREATININE 1.22* 1.04* 1.17*  GFRNONAA 46* 59* 51*  GFRAA 53*  --   --     LIVER FUNCTION TESTS: Recent Labs    04/17/20 1108 07/02/20 1306 09/30/20 1608  BILITOT 0.7 0.8 1.0  AST 29 30 44*  ALT 19 20 45*  ALKPHOS 97 99 116  PROT 8.5* 7.8 8.1  ALBUMIN 3.5 3.2* 3.5    TUMOR MARKERS: No results for input(s): AFPTM, CEA, CA199, CHROMGRNA in the last 8760 hours.  Assessment and Plan:  Pleasant 65 year old female who continues to do very well 1.5 years status post radiation segmentectomy's of hepatic segments 2 and 3 for  hepatocellular carcinoma.  She has thus far experienced a complete response to therapy with no evidence of residual, recurrent or new disease on follow-up surveillance MRI.  We will continue close surveillance monitoring every 3 months for the first 2 years.  After 2 years of stability, we will transition to surveillance imaging every 6 months.  1.)  Gadolinium enhanced MRI of the abdomen and clinic visit in 3 months.    Electronically Signed: Criselda Peaches 02/24/2021, 9:33 AM   I spent a total of  10 Minutes in remote  clinical consultation, greater than 50% of which was counseling/coordinating  care for hepatocellular carcinoma.    Visit type: Audio only (telephone). Audio (no video) only due to patient preference. Alternative for in-person consultation at [facility], [address] [place], [place], [place]. This visit type was conducted due to national recommendations for restrictions regarding the COVID-19 Pandemic (e.g. social distancing).  This format is felt to be most appropriate for this patient at this time.  All issues noted in this document were discussed and addressed.

## 2021-02-24 NOTE — Progress Notes (Signed)
[facility] Palliative Care Consult Note Telephone: [phone]  Fax: [phone]    Date of encounter: 02/24/21 12:46 PM PATIENT NAME: Adriana Miller 65 years old [address] [place] [address]   [phone] (home)  DOB: 06-21-1955 MRN: [id number] PRIMARY CARE PROVIDER:    Carolynne Edouard,  [place] [address] [phone]  REFERRING PROVIDER:   Carolynne Edouard [place],  [place] [address] [phone]  RESPONSIBLE PARTY:    Contact Information     Name Relation Home Work Mobile   Torain,James Brother [phone]  [phone]        I met face to face with patient in  home. Palliative Care was asked to follow this patient by consultation request of  McDonough, Si Gaul, PA* to address advance care planning and complex medical decision making. This is a follow up visit.                                   ASSESSMENT AND PLAN / RECOMMENDATIONS:   Advance Care Planning/Goals of Care: Goals include to maximize quality of life and symptom management. Our advance care planning conversation included a discussion about:     Exploration of personal, cultural or spiritual beliefs that might influence medical decisions  Exploration of goals of care in the event of a sudden injury or illness  Review of advance directive document . Decision not to resuscitate or to de-escalate disease focused treatments due to poor prognosis. CODE STATUS: DNR  I reviewed a MOST form today. The patient and family outlined their wishes for the following treatment decisions:  Cardiopulmonary Resuscitation: Do Not Attempt Resuscitation (DNR/No CPR)  Medical Interventions: Full Scope of Treatment: Use intubation, advanced airway interventions, mechanical ventilation, cardioversion as indicated, medical treatment, IV fluids, etc, also provide comfort measures. Transfer to the hospital if indicated  Antibiotics: Antibiotics if  indicated  IV Fluids: IV fluids if indicated  Feeding Tube: Feeding tube for a defined trial period    Symptom Management/Plan:  Appetite good, states gained weight., endorses 180 lbs. States she used to be 170 lbs. Ladon Applebaum some ascites but no LE edema. Says sugars are up and down, latest a1c=8.4, would recommend monitoring every 4 -6 months.  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  States recent scan shows disease process stable.   Denies pain, insomnia - sleeps during day and up at night due to work history of night shifts.  States this is not too disruptive as she seldom goes out. She uses trazodone on occasion.   Follow up Palliative Care Visit: Palliative care will continue to follow for complex medical decision making, advance care planning, and clarification of goals. Return 12 weeks or prn.  I spent 40 minutes providing this consultation. More than 50% of the time in this consultation was spent in counseling and care coordination.  PPS: 50%  HOSPICE ELIGIBILITY/DIAGNOSIS: TBD  Chief Complaint: debility  HISTORY OF PRESENT ILLNESS:  Adriana Miller is a 65 y.o. year old female  with liver disease, debility, DM .   History obtained from review of EMR, discussion with primary team, and interview with family, facility staff/caregiver and/or Ms. Heinzelman.  I reviewed available labs, medications, imaging, studies and related documents from the EMR.  Records reviewed and summarized above.   ROS   General: NAD  EYES: denies vision changes ENMT: denies dysphagia Cardiovascular: denies chest pain, denies DOE Pulmonary: denies cough, denies increased SOB Abdomen: endorses good appetite, denies constipation, endorses continence of bowel GU: denies dysuria, endorses continence of urine MSK:  endorses muscle weakness,  no falls reported, uses cane Skin: denies rashes or wounds Neurological: denies pain,  endorses  insomnia Psych:  Endorses positive mood Heme/lymph/immuno: denies bruises, abnormal bleeding  Physical Exam: Current and past weights: 180 lbs reported Constitutional: NAD General: frail appearing EYES: anicteric sclera, lids intact, no discharge  ENMT: intact hearing, oral mucous membranes moist CV: S1S2, RRR, no LE edema Pulmonary: LCTA, no increased work of breathing, no cough, room air Abdomen: intake 100%, normo-active BS + 4 quadrants, soft and non tender, no ascites GU: deferred MSK: mild  sarcopenia, moves all extremities, ambulatory Skin: warm and dry, no rashes or wounds on visible skin Neuro:  moderate  generalized weakness, mild  cognitive impairment Psych: non-anxious affect, A and O x 3 Hem/lymph/immuno: no widespread bruising   Thank you for the opportunity to participate in the care of [name].  The palliative care team will continue to follow. Please call our office at [phone] if we can be of additional assistance.   Jason Coop, NP DNP, AGPCNP-BC  COVID-19 PATIENT SCREENING TOOL Asked and negative response unless otherwise noted:   Have you had symptoms of covid, tested positive or been in contact with someone with symptoms/positive test in the past 5-10 days?

## 2021-03-20 NOTE — Telephone Encounter (Signed)
See previous note from 10/03/20, signing note

## 2021-04-05 ENCOUNTER — Other Ambulatory Visit: Payer: Self-pay | Admitting: Physician Assistant

## 2021-04-05 DIAGNOSIS — K7031 Alcoholic cirrhosis of liver with ascites: Secondary | ICD-10-CM

## 2021-04-06 ENCOUNTER — Other Ambulatory Visit: Payer: Self-pay | Admitting: Internal Medicine

## 2021-04-06 ENCOUNTER — Other Ambulatory Visit: Payer: Self-pay | Admitting: Physician Assistant

## 2021-04-06 DIAGNOSIS — G2581 Restless legs syndrome: Secondary | ICD-10-CM

## 2021-04-06 DIAGNOSIS — M10222 Drug-induced gout, left elbow: Secondary | ICD-10-CM

## 2021-04-06 DIAGNOSIS — E1165 Type 2 diabetes mellitus with hyperglycemia: Secondary | ICD-10-CM

## 2021-04-06 DIAGNOSIS — I4891 Unspecified atrial fibrillation: Secondary | ICD-10-CM

## 2021-04-09 ENCOUNTER — Other Ambulatory Visit: Payer: Self-pay | Admitting: Physician Assistant

## 2021-04-09 DIAGNOSIS — E1129 Type 2 diabetes mellitus with other diabetic kidney complication: Secondary | ICD-10-CM

## 2021-05-11 ENCOUNTER — Other Ambulatory Visit: Payer: Self-pay

## 2021-05-11 ENCOUNTER — Ambulatory Visit (INDEPENDENT_AMBULATORY_CARE_PROVIDER_SITE_OTHER): Payer: Medicare Other | Admitting: Physician Assistant

## 2021-05-11 ENCOUNTER — Encounter: Payer: Self-pay | Admitting: Physician Assistant

## 2021-05-11 DIAGNOSIS — C220 Liver cell carcinoma: Secondary | ICD-10-CM

## 2021-05-11 DIAGNOSIS — I10 Essential (primary) hypertension: Secondary | ICD-10-CM

## 2021-05-11 DIAGNOSIS — Z515 Encounter for palliative care: Secondary | ICD-10-CM

## 2021-05-11 DIAGNOSIS — G4700 Insomnia, unspecified: Secondary | ICD-10-CM | POA: Diagnosis not present

## 2021-05-11 DIAGNOSIS — E119 Type 2 diabetes mellitus without complications: Secondary | ICD-10-CM | POA: Diagnosis not present

## 2021-05-11 DIAGNOSIS — Z794 Long term (current) use of insulin: Secondary | ICD-10-CM

## 2021-05-11 DIAGNOSIS — I4891 Unspecified atrial fibrillation: Secondary | ICD-10-CM | POA: Diagnosis not present

## 2021-05-11 DIAGNOSIS — Z0001 Encounter for general adult medical examination with abnormal findings: Secondary | ICD-10-CM

## 2021-05-11 DIAGNOSIS — R300 Dysuria: Secondary | ICD-10-CM | POA: Diagnosis not present

## 2021-05-11 DIAGNOSIS — K7031 Alcoholic cirrhosis of liver with ascites: Secondary | ICD-10-CM

## 2021-05-11 LAB — POCT GLYCOSYLATED HEMOGLOBIN (HGB A1C): Hemoglobin A1C: 6.5 % — AB (ref 4.0–5.6)

## 2021-05-11 MED ORDER — APIXABAN 2.5 MG PO TABS: 2.5000 mg | ORAL_TABLET | Freq: Two times a day (BID) | ORAL | 3 refills | Status: DC

## 2021-05-11 MED ORDER — TRAZODONE HCL 50 MG PO TABS: ORAL_TABLET | ORAL | 2 refills | Status: DC

## 2021-05-11 NOTE — Progress Notes (Signed)
[facility] [place], [place] [address]  Internal MEDICINE  Office Visit Note  Patient Name: Adriana Miller  [id number]  [id number]  Date of Service: 05/17/2021  Chief Complaint  Patient presents with   Medicare Wellness    Discuss diabetic equipment    Hyperlipidemia   Hypertension   Diabetes     HPI Pt is here for routine health maintenance examination -Requests B12 injection -Sugars 141 last week, not checked this week -taking 35 units insulin, and 1mg  amaryl -Wears pullups at night -sleeps 2-3 hours then wakes up for a while then goes back to sleep, states mirtazapine didn't help so will go back to trazodone and may titrate up if needed -decreased foot sensation bilaterally -Continues to be followed by palliative care for hepatocellular carcinoma and liver cirrhosis  Current Medication: Outpatient Encounter Medications as of 05/11/2021  Medication Sig   allopurinol (ZYLOPRIM) 100 MG tablet TAKE 1 TABLET BY MOUTH EVERY DAY   BD PEN NEEDLE NANO 2ND GEN 32G X 4 MM MISC USE AS DIRECTED WITH TOUJEO   escitalopram (LEXAPRO) 10 MG tablet TAKE 1 TABLET (10 MG TOTAL) BY MOUTH DAILY. FOR ANXIETY/SLEEP   furosemide (LASIX) 20 MG tablet Take 1 tablet (20 mg total) by mouth daily. PLEAS CALL OFFICE TO SCHEDULE APPOINTMENT FOR FURTHER REFILLS. THANK YOU!   glimepiride (AMARYL) 1 MG tablet TAKE 1 TABLET BY MOUTH DAILY WITH BREAKFAST.   glucose blood (ACCU-CHEK GUIDE) test strip USE AS INSTRUCTED ONCE A DAILY   insulin glargine, 1 Unit Dial, (TOUJEO SOLOSTAR) 300 UNIT/ML Solostar Pen INJECT 30 UNITS SUBCUTANEOUSLY IN THE EVENING WITH FOOD   metoprolol tartrate (LOPRESSOR) 25 MG tablet Take 0.5 tablets (12.5 mg total) by mouth 2 (two) times daily.   Safety Lancets 28G MISC Use as directed once a daily Dx e11.65   spironolactone (ALDACTONE) 25 MG tablet TAKE 1 TABLET (25 MG TOTAL) BY MOUTH DAILY. FOR CIRRHOSIS OF LIVER   [DISCONTINUED] ELIQUIS 2.5 MG TABS  tablet TAKE 1 TABLET BY MOUTH TWICE A DAY   [DISCONTINUED] mirtazapine (REMERON) 7.5 MG tablet TAKE 1 TABLET BY MOUTH EVERYDAY AT BEDTIME   [DISCONTINUED] traZODone (DESYREL) 50 MG tablet Take 0.5-1 tablets (25-50 mg total) by mouth at bedtime as needed for sleep.   apixaban (ELIQUIS) 2.5 MG TABS tablet Take 1 tablet (2.5 mg total) by mouth 2 (two) times daily.   traZODone (DESYREL) 50 MG tablet Take 1-2 tabs by mouth at night before bed for insomnia   No facility-administered encounter medications on file as of 05/11/2021.    Surgical History: Past Surgical History:  Procedure Laterality Date   CEREBRAL ANEURYSM REPAIR  1991   COLONOSCOPY WITH PROPOFOL N/A 02/12/2019   Procedure: COLONOSCOPY WITH PROPOFOL;  Surgeon: Lin Landsman, MD;  Location: [facility] ENDOSCOPY;  Service: Gastroenterology;  Laterality: N/A;   ESOPHAGOGASTRODUODENOSCOPY (EGD) WITH PROPOFOL N/A 02/12/2019   Procedure: ESOPHAGOGASTRODUODENOSCOPY (EGD) WITH PROPOFOL;  Surgeon: Lin Landsman, MD;  Location: [facility] ENDOSCOPY;  Service: Gastroenterology;  Laterality: N/A;   IR ANGIOGRAM SELECTIVE EACH ADDITIONAL VESSEL  09/21/2019   IR ANGIOGRAM SELECTIVE EACH ADDITIONAL VESSEL  09/21/2019   IR ANGIOGRAM SELECTIVE EACH ADDITIONAL VESSEL  09/21/2019   IR ANGIOGRAM SELECTIVE EACH ADDITIONAL VESSEL  09/21/2019   IR ANGIOGRAM SELECTIVE EACH ADDITIONAL VESSEL  10/02/2019   IR ANGIOGRAM VISCERAL SELECTIVE  09/21/2019   IR ANGIOGRAM VISCERAL SELECTIVE  09/21/2019   IR ANGIOGRAM VISCERAL SELECTIVE  10/02/2019   IR EMBO TUMOR ORGAN ISCHEMIA INFARCT  INC GUIDE ROADMAPPING  10/02/2019   IR EMBO TUMOR ORGAN ISCHEMIA INFARCT INC GUIDE ROADMAPPING  10/02/2019   IR RADIOLOGIST EVAL & MGMT  08/28/2019   IR RADIOLOGIST EVAL & MGMT  01/30/2020   IR RADIOLOGIST EVAL & MGMT  04/30/2020   IR RADIOLOGIST EVAL & MGMT  07/30/2020   IR RADIOLOGIST EVAL & MGMT  10/28/2020   IR RADIOLOGIST EVAL & MGMT  02/24/2021   IR US GUIDE VASC ACCESS LEFT  09/21/2019   IR US  GUIDE VASC ACCESS LEFT  10/02/2019    Medical History: Past Medical History:  Diagnosis Date   B12 deficiency 04/05/2017   Blood in stool    Cerebral aneurysm    Chronic kidney disease    Diabetes mellitus without complication (HCC)    Gastric erosion with bleeding    Hyperlipidemia    Hypertension    Lower extremity cellulitis 02/08/2019   Seizure disorder ([place])     Family History: Family History  Problem Relation Age of Onset   Heart attack Brother 23      Review of Systems  Constitutional:  Negative for chills and unexpected weight change.  HENT:  Negative for congestion, rhinorrhea, sneezing and sore throat.   Eyes:  Negative for redness.  Respiratory:  Negative for cough, chest tightness and shortness of breath.   Cardiovascular:  Negative for chest pain and palpitations.  Gastrointestinal:  Negative for abdominal pain, constipation, diarrhea, nausea and vomiting.  Genitourinary:  Negative for dysuria and frequency.  Musculoskeletal:  Positive for arthralgias. Negative for back pain, joint swelling and neck pain.  Skin:  Negative for rash.  Neurological: Negative.  Negative for tremors and numbness.  Hematological:  Negative for adenopathy. Does not bruise/bleed easily.  Psychiatric/Behavioral:  Positive for sleep disturbance. Negative for behavioral problems (Depression) and suicidal ideas. The patient is not nervous/anxious.     Vital Signs: BP 138/70 Comment: 164/81   Pulse 80    Temp 98.3 F (36.8 C)    Resp 16    Ht 5\' 5"  (1.651 m)    Wt 184 lb 9.6 oz (83.7 kg)    SpO2 97%    BMI 30.72 kg/m    Physical Exam Vitals and nursing note reviewed.  Constitutional:      General: She is not in acute distress.    Appearance: She is well-developed. She is not diaphoretic.  HENT:     Head: Normocephalic and atraumatic.     Mouth/Throat:     Pharynx: No oropharyngeal exudate.  Eyes:     Pupils: Pupils are equal, round, and reactive to light.  Neck:     Thyroid:  No thyromegaly.     Vascular: No JVD.     Trachea: No tracheal deviation.  Cardiovascular:     Rate and Rhythm: Normal rate and regular rhythm.     Heart sounds: Normal heart sounds. No murmur heard.   No friction rub. No gallop.  Pulmonary:     Effort: Pulmonary effort is normal. No respiratory distress.     Breath sounds: No wheezing or rales.  Chest:     Chest wall: No tenderness.  Abdominal:     General: Bowel sounds are normal.     Palpations: Abdomen is soft.  Musculoskeletal:        General: Normal range of motion.     Cervical back: Normal range of motion and neck supple.  Lymphadenopathy:     Cervical: No cervical adenopathy.  Skin:  General: Skin is warm and dry.  Neurological:     Mental Status: She is alert and oriented to person, place, and time.     Cranial Nerves: No cranial nerve deficit.     Comments: Utilizes cane for mobility  Psychiatric:        Behavior: Behavior normal.        Thought Content: Thought content normal.        Judgment: Judgment normal.     LABS: Recent Results (from the past 2160 hour(s))  POCT HgB A1C     Status: Abnormal   Collection Time: 05/11/21  2:47 PM  Result Value Ref Range   Hemoglobin A1C 6.5 (A) 4.0 - 5.6 %   HbA1c POC (<> result, manual entry)     HbA1c, POC (prediabetic range)     HbA1c, POC (controlled diabetic range)          Assessment/Plan: 1. Encounter for general adult medical examination with abnormal findings CPE performed  2. Type 2 diabetes mellitus without complication, without long-term current use of insulin (HCC) - POCT HgB A1C is 6.5 which is significantly improved from 8.4 at last check.  We will continue with current medications and have patient continue to improve diet  3. Essential hypertension Stable, continue current medication  4. Insomnia, unspecified type Will stop mirtazapine and restart trazodone starting at 50 mg before bed.  May titrate up to 200 mg before bed if needed -  traZODone (DESYREL) 50 MG tablet; Take 1-2 tabs by mouth at night before bed for insomnia  Dispense: 30 tablet; Refill: 2  5. Atrial fibrillation, unspecified type (HCC) - apixaban (ELIQUIS) 2.5 MG TABS tablet; Take 1 tablet (2.5 mg total) by mouth 2 (two) times daily.  Dispense: 60 tablet; Refill: 3  6. Hepatocellular carcinoma ([place]) Followed by oncology and palliative care  7. Palliative care status Followed by palliative care  8. Alcoholic cirrhosis of liver with ascites ([place]) Followed by oncology and palliative care  9. Dysuria - UA/M w/rflx Culture, Routine   General Counseling: Kiya verbalizes understanding of the findings of todays visit and agrees with plan of treatment. I have discussed any further diagnostic evaluation that may be needed or ordered today. We also reviewed her medications today. she has been encouraged to call the office with any questions or concerns that should arise related to todays visit.    Counseling:    Orders Placed This Encounter  Procedures   UA/M w/rflx Culture, Routine   POCT HgB A1C    Meds ordered this encounter  Medications   apixaban (ELIQUIS) 2.5 MG TABS tablet    Sig: Take 1 tablet (2.5 mg total) by mouth 2 (two) times daily.    Dispense:  60 tablet    Refill:  3   traZODone (DESYREL) 50 MG tablet    Sig: Take 1-2 tabs by mouth at night before bed for insomnia    Dispense:  30 tablet    Refill:  2    This patient was seen by Drema Dallas, PA-C in collaboration with Dr. Clayborn Bigness as a part of collaborative care agreement.  Total time spent:30 Minutes  Time spent includes review of chart, medications, test results, and follow up plan with the patient.     Lavera Guise, MD  Internal Medicine

## 2021-05-19 ENCOUNTER — Other Ambulatory Visit: Payer: Self-pay | Admitting: Physician Assistant

## 2021-05-19 DIAGNOSIS — G4700 Insomnia, unspecified: Secondary | ICD-10-CM

## 2021-05-21 ENCOUNTER — Other Ambulatory Visit: Payer: Self-pay | Admitting: Interventional Radiology

## 2021-05-21 DIAGNOSIS — C220 Liver cell carcinoma: Secondary | ICD-10-CM

## 2021-05-22 ENCOUNTER — Other Ambulatory Visit: Payer: Self-pay | Admitting: Internal Medicine

## 2021-05-22 ENCOUNTER — Other Ambulatory Visit: Payer: Self-pay | Admitting: Physician Assistant

## 2021-05-22 DIAGNOSIS — F320 Major depressive disorder, single episode, mild: Secondary | ICD-10-CM

## 2021-06-05 ENCOUNTER — Other Ambulatory Visit: Payer: Self-pay

## 2021-06-05 ENCOUNTER — Ambulatory Visit
Admission: RE | Admit: 2021-06-05 | Discharge: 2021-06-05 | Disposition: A | Discharge status: Home / Self Care | Payer: Medicare Other | Source: Ambulatory Visit | Attending: Interventional Radiology | Admitting: Interventional Radiology

## 2021-06-05 DIAGNOSIS — C220 Liver cell carcinoma: Secondary | ICD-10-CM | POA: Diagnosis not present

## 2021-06-05 DIAGNOSIS — K7290 Hepatic failure, unspecified without coma: Secondary | ICD-10-CM | POA: Diagnosis not present

## 2021-06-05 DIAGNOSIS — K8020 Calculus of gallbladder without cholecystitis without obstruction: Secondary | ICD-10-CM | POA: Diagnosis not present

## 2021-06-05 DIAGNOSIS — K7460 Unspecified cirrhosis of liver: Secondary | ICD-10-CM | POA: Diagnosis not present

## 2021-06-05 IMAGING — MR MR ABDOMEN WO/W CM
18 series · 48 of 48 positions shown · IV contrast (gadavist)
Comparison: [DATE]

CLINICAL DATA: Follow-up liver lesions

EXAM:
MRI ABDOMEN WITHOUT AND WITH CONTRAST
TECHNIQUE: Multiplanar multisequence MR imaging of the abdomen was performed
both before and after the administration of intravenous contrast.
CONTRAST:  7.5mL GADAVIST GADOBUTROL 1 MMOL/ML IV SOLN

[Series 3: T2 · coronal · 6.0mm · 1.19mm/px · 2 of 28 slices shown (1 of 2)]
[im 1/28]
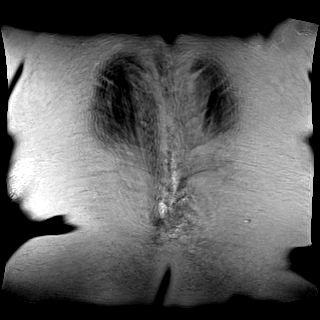
[im 28/28]
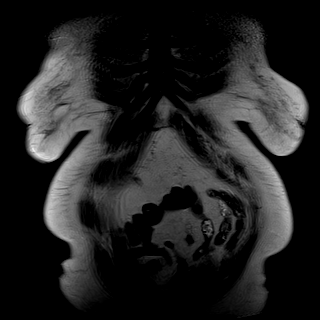

[Series 4: T2 · axial · 6.0mm · 1.19mm/px · z∈[-75,+148]mm · 2 of 32 slices shown (2 of 2)]
[im 1/32]
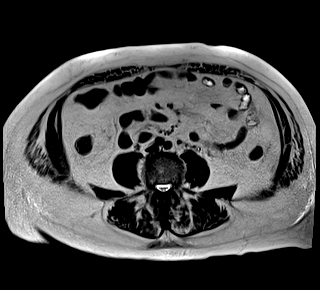
[im 32/32]
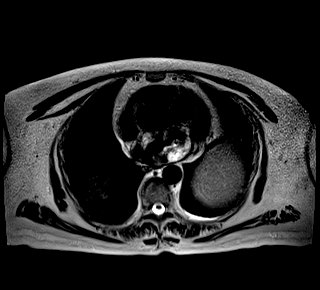

[Series 5: T1 · axial · 3.0mm · 1.19mm/px · z∈[-65,+148]mm · 4 of 72 slices shown (1 of 2)]
[im 1/72]
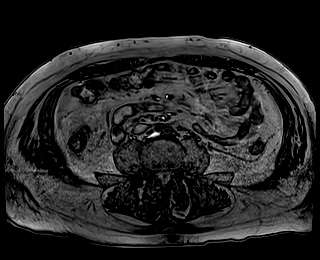
[im 24/72]
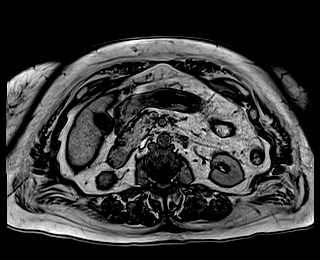
[im 48/72]
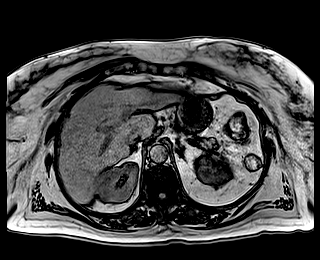
[im 72/72]
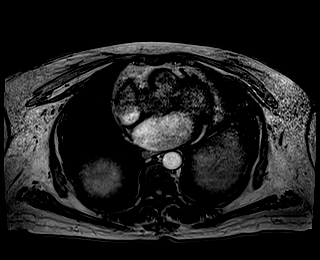

[Series 6: T1 · axial · 3.0mm · 1.19mm/px · z∈[-65,+148]mm · 3 of 72 slices shown (2 of 2)]
[im 1/72]
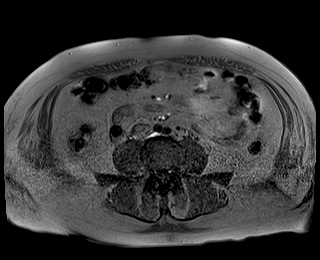
[im 36/72]
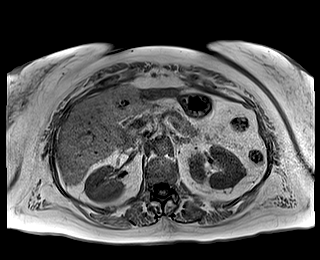
[im 72/72]
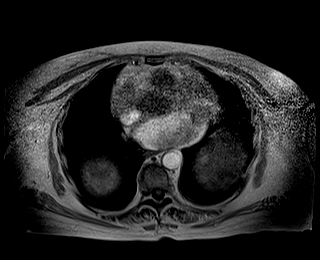

[Series 7: bSSFP · axial · 6.0mm · 0.74mm/px · 1 of 30 slices shown]
[im 1/30]
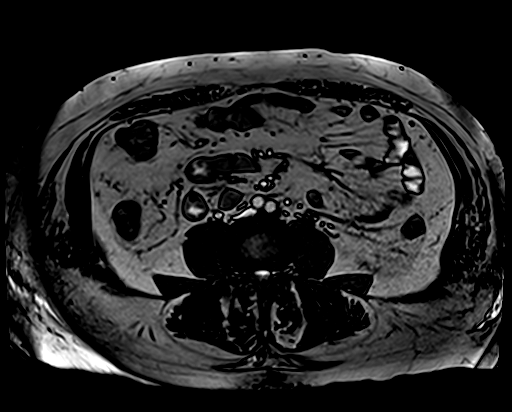

[Series 9: T2 fat-sat · axial · 6.0mm · 1.19mm/px · 1 of 34 slices shown]
[im 1/34]
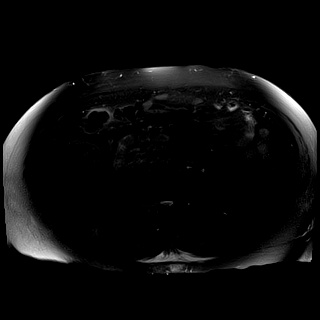

[Series 10: ax dwi_tracew · axial · 6.0mm · 1.42mm/px · z∈[-68,+169]mm · 4 of 102 slices shown]
[im 1/102]
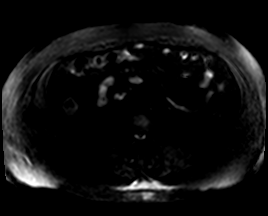
[im 34/102]
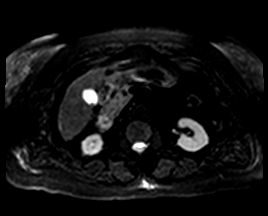
[im 68/102]
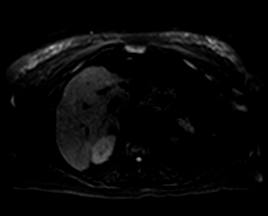
[im 102/102]
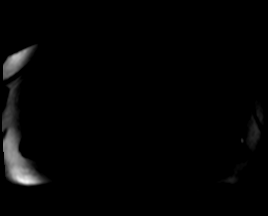

[Series 11: ax dwi_adc · axial · 6.0mm · 1.42mm/px · 1 of 34 slices shown]
[im 1/34]
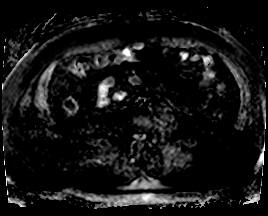

[Series 12: T1 dynamic fat-sat · axial · non-contrast · 3.0mm · 1.19mm/px · z∈[-77,+160]mm · 3 of 80 slices shown (1 of 5)]
[im 1/80]
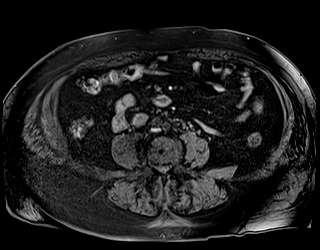
[im 40/80]
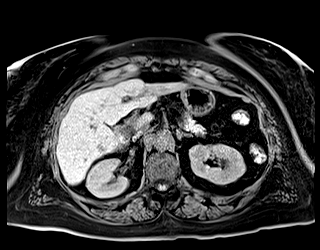
[im 80/80]
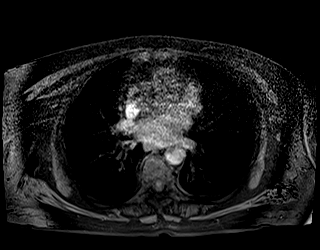

[Series 13: T1 dynamic fat-sat post-contrast · axial · 3.0mm · 1.19mm/px · z∈[-77,+160]mm · 3 of 80 slices shown (1 of 4)]
[im 1/80]
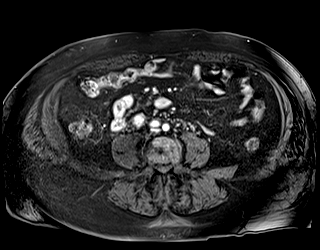
[im 40/80]
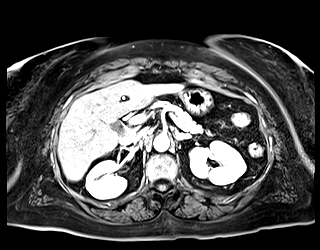
[im 80/80]
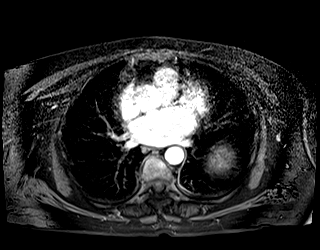

[Series 14: T1 dynamic fat-sat · axial · 3.0mm · 1.19mm/px · z∈[-77,+160]mm · 3 of 80 slices shown (2 of 5)]
[im 1/80]
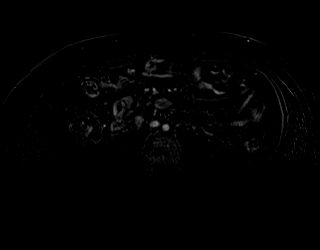
[im 40/80]
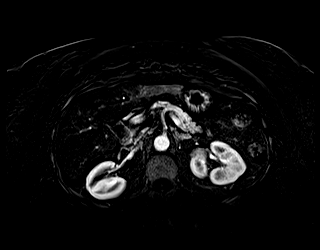
[im 80/80]
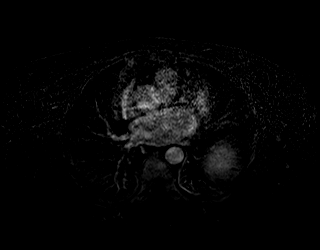

[Series 15: T1 dynamic fat-sat post-contrast · axial · 3.0mm · 1.19mm/px · z∈[-77,+160]mm · 3 of 80 slices shown (2 of 4)]
[im 1/80]
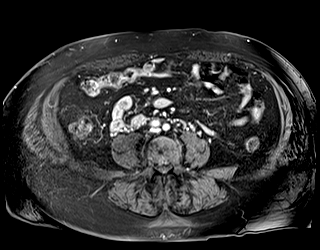
[im 40/80]
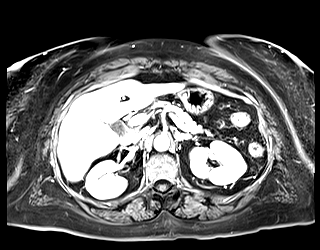
[im 80/80]
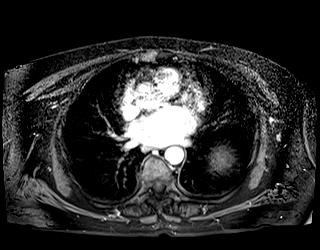

[Series 16: T1 dynamic fat-sat · axial · 3.0mm · 1.19mm/px · z∈[-77,+160]mm · 3 of 80 slices shown (3 of 5)]
[im 1/80]
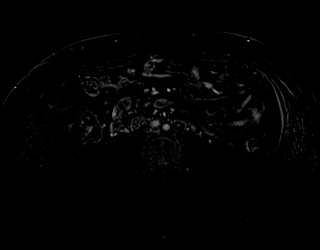
[im 40/80]
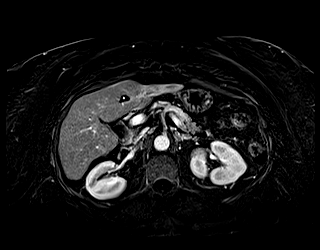
[im 80/80]
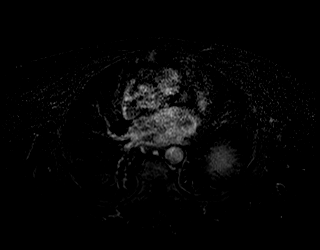

[Series 17: T1 dynamic fat-sat post-contrast · axial · 3.0mm · 1.19mm/px · z∈[-77,+160]mm · 3 of 80 slices shown (3 of 4)]
[im 1/80]
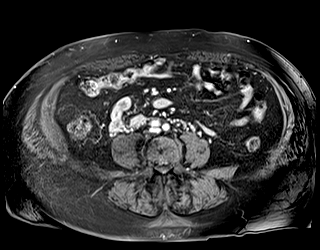
[im 40/80]
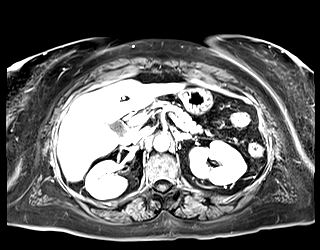
[im 80/80]
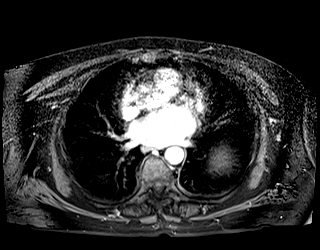

[Series 18: T1 dynamic fat-sat · axial · 3.0mm · 1.19mm/px · z∈[-77,+160]mm · 3 of 80 slices shown (4 of 5)]
[im 1/80]
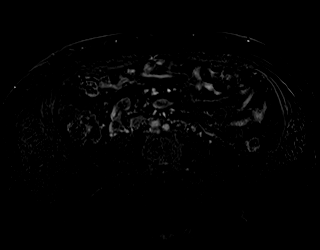
[im 40/80]
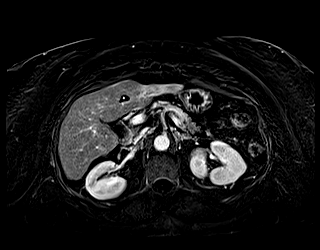
[im 80/80]
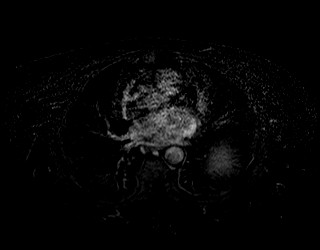

[Series 19: T1 dynamic post-contrast · coronal · 3.0mm · 1.31mm/px · 3 of 72 slices shown]
[im 1/72]
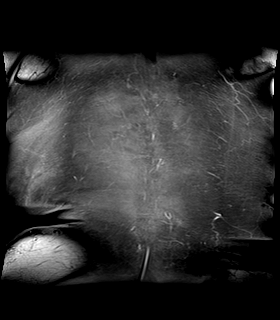
[im 36/72]
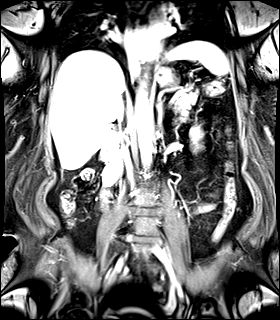
[im 72/72]
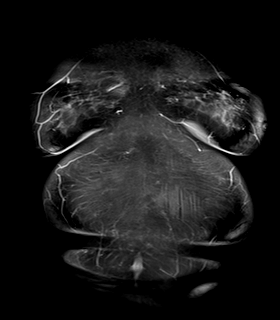

[Series 20: T1 dynamic fat-sat post-contrast · axial · 3.0mm · 1.19mm/px · z∈[-77,+160]mm · 3 of 80 slices shown (4 of 4)]
[im 1/80]
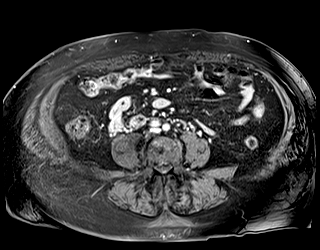
[im 40/80]
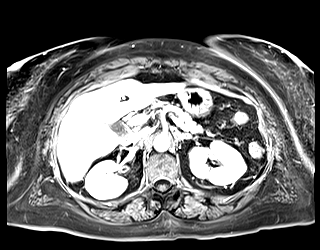
[im 80/80]
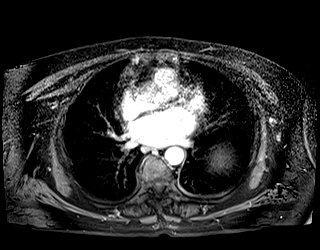

[Series 21: T1 dynamic fat-sat · axial · 3.0mm · 1.19mm/px · z∈[-77,+160]mm · 3 of 80 slices shown (5 of 5)]
[im 1/80]
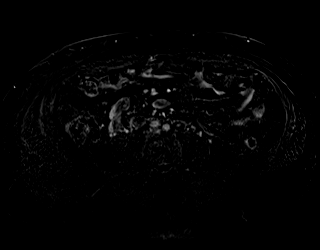
[im 40/80]
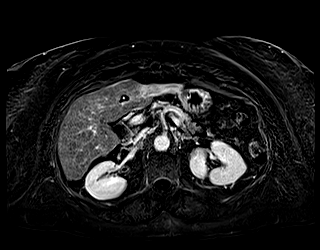
[im 80/80]
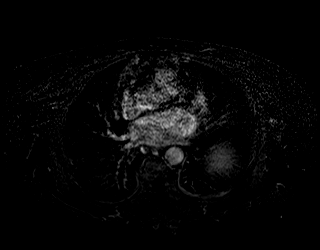

[48 of 48 positions shown; findings below may reference images not displayed]

FINDINGS: Lower chest: No acute findings.

Hepatobiliary: Coarse, nodular, cirrhotic morphology of the liver.
Atrophic, hyperenhancing appearance of the left lobe of the liver,
hepatic segments II and III, unchanged. Unchanged, circumscribed,
nonenhancing lesion of hepatic segment II, measuring 1.1 x 0.6 cm
(series 15, image 30). Unchanged small nonenhancing cyst or
hemangioma of the posterior right lobe liver, hepatic segment 6
(series 9, image 18). Unchanged small volume of perihepatic fluid
about the inferior tip of the right lobe of the liver (series 9,
image 25). Tiny gallstones in the dependent gallbladder. No biliary
ductal dilatation.

Pancreas: No mass, inflammatory changes, or other parenchymal
abnormality identified.No pancreatic ductal dilatation.

Spleen: Within normal limits in size. Unchanged small cyst or
hemangioma of the left lobe of the liver (series 15, image 10).

Adrenals/Urinary Tract: Normal adrenal glands. Stable small, benign
bilateral renal cysts. No renal masses or suspicious contrast
enhancement identified. No evidence of hydronephrosis.

Stomach/Bowel: Visualized portions within the abdomen are
unremarkable.

Vascular/Lymphatic: No pathologically enlarged lymph nodes
identified. No abdominal aortic aneurysm demonstrated.

Other:  None.

Musculoskeletal: No suspicious osseous lesions identified.
IMPRESSION: 1. Unchanged, circumscribed, nonenhancing lesion of hepatic segment
II, consistent with treated, nonviable hepatocellular carcinoma
(KENA TR nonviable).
2. As on prior examination, a previously seen lesion of hepatic
segment III is not well appreciated and without suspicious contrast
enhancement in this vicinity.
3. No new suspicious liver lesions.
4. Coarse, nodular, cirrhotic morphology of the liver. Atrophic,
hyperenhancing appearance of the left lobe of the liver, hepatic
segments II and III, unchanged, consistent with sequelae of
radiation therapy and embolization.
5. Cholelithiasis.

## 2021-06-05 MED ORDER — GADOBUTROL 1 MMOL/ML IV SOLN
7.5000 mL | Freq: Once | INTRAVENOUS | Status: AC | PRN
  Administered 2021-06-05: 7.5 mL via INTRAVENOUS

## 2021-06-08 ENCOUNTER — Ambulatory Visit (INDEPENDENT_AMBULATORY_CARE_PROVIDER_SITE_OTHER): Payer: Medicare Other | Admitting: Physician Assistant

## 2021-06-08 ENCOUNTER — Ambulatory Visit
Admission: RE | Admit: 2021-06-08 | Discharge: 2021-06-08 | Disposition: A | Discharge status: Home / Self Care | Payer: Medicare Other | Source: Ambulatory Visit | Attending: Interventional Radiology | Admitting: Interventional Radiology

## 2021-06-08 ENCOUNTER — Encounter: Payer: Self-pay | Admitting: *Deleted

## 2021-06-08 ENCOUNTER — Other Ambulatory Visit: Payer: Self-pay

## 2021-06-08 DIAGNOSIS — E119 Type 2 diabetes mellitus without complications: Secondary | ICD-10-CM | POA: Diagnosis not present

## 2021-06-08 DIAGNOSIS — S91205A Unspecified open wound of left lesser toe(s) with damage to nail, initial encounter: Secondary | ICD-10-CM

## 2021-06-08 DIAGNOSIS — C220 Liver cell carcinoma: Secondary | ICD-10-CM

## 2021-06-08 DIAGNOSIS — Z794 Long term (current) use of insulin: Secondary | ICD-10-CM

## 2021-06-08 DIAGNOSIS — S99922A Unspecified injury of left foot, initial encounter: Secondary | ICD-10-CM

## 2021-06-08 HISTORY — PX: IR RADIOLOGIST EVAL & MGMT: IMG5224

## 2021-06-08 NOTE — Progress Notes (Signed)
[facility] [place], [place] [address]  Internal MEDICINE  Office Visit Note  Patient Name: Adriana Miller  [id number]  [id number]  Date of Service: 06/13/2021  Chief Complaint  Patient presents with   Nail Problem    Toe nail has fallen off - fell off on Friday    HPI Patient is here for an acute visit - She reports that her toenail fell off on Friday and that family members have been helping her to keep the area clean and bandaged.  It is her third toe on her left foot.  She thinks her toenail caught on her sock when she went to pull it off and that is what led to the nail coming off - She denies any pain except for dull ache on direct palpation - Patient is a diabetic with reduced sensation and is worried about what she should be doing to care for toe.  Fortunately sugars well controlled at last check with an A1c of 6.5 -Upon examining the toe there is no signs of infection currently however with her history of diabetes will need to keep clean and watch for signs for infection.  Discussed referral to podiatry for further management and nail care.  Discussed signs and symptoms to watch out for that can be indicative of infection--this was also discussed with family members and may try using some Polysporin over the area in addition to dabbing with hydrogen peroxide as she has been doing.  Educated on not submersing toes in peroxide as this can be damaging to the rest of skin and to use cotton ball or Q-tip on the affected area  Current Medication: Outpatient Encounter Medications as of 06/08/2021  Medication Sig   ACCU-CHEK GUIDE test strip USE AS INSTRUCTED ONCE A DAILY   allopurinol (ZYLOPRIM) 100 MG tablet TAKE 1 TABLET BY MOUTH EVERY DAY   apixaban (ELIQUIS) 2.5 MG TABS tablet Take 1 tablet (2.5 mg total) by mouth 2 (two) times daily.   BD PEN NEEDLE NANO 2ND GEN 32G X 4 MM MISC USE AS DIRECTED WITH TOUJEO   escitalopram (LEXAPRO) 10 MG tablet TAKE 1  TABLET (10 MG TOTAL) BY MOUTH DAILY. FOR ANXIETY/SLEEP   furosemide (LASIX) 20 MG tablet Take 1 tablet (20 mg total) by mouth daily. PLEAS CALL OFFICE TO SCHEDULE APPOINTMENT FOR FURTHER REFILLS. THANK YOU!   glimepiride (AMARYL) 1 MG tablet TAKE 1 TABLET BY MOUTH DAILY WITH BREAKFAST.   insulin glargine, 1 Unit Dial, (TOUJEO SOLOSTAR) 300 UNIT/ML Solostar Pen INJECT 30 UNITS SUBCUTANEOUSLY IN THE EVENING WITH FOOD   metoprolol tartrate (LOPRESSOR) 25 MG tablet Take 0.5 tablets (12.5 mg total) by mouth 2 (two) times daily.   Safety Lancets 28G MISC Use as directed once a daily Dx e11.65   spironolactone (ALDACTONE) 25 MG tablet TAKE 1 TABLET (25 MG TOTAL) BY MOUTH DAILY. FOR CIRRHOSIS OF LIVER   traZODone (DESYREL) 50 MG tablet TAKE 1-2 TABS BY MOUTH AT NIGHT BEFORE BED FOR INSOMNIA   No facility-administered encounter medications on file as of 06/08/2021.    Surgical History: Past Surgical History:  Procedure Laterality Date   CEREBRAL ANEURYSM REPAIR  1991   COLONOSCOPY WITH PROPOFOL N/A 02/12/2019   Procedure: COLONOSCOPY WITH PROPOFOL;  Surgeon: Lin Landsman, MD;  Location: [facility] ENDOSCOPY;  Service: Gastroenterology;  Laterality: N/A;   ESOPHAGOGASTRODUODENOSCOPY (EGD) WITH PROPOFOL N/A 02/12/2019   Procedure: ESOPHAGOGASTRODUODENOSCOPY (EGD) WITH PROPOFOL;  Surgeon: Lin Landsman, MD;  Location: [place];  Service: Gastroenterology;  Laterality: N/A;   IR ANGIOGRAM SELECTIVE EACH ADDITIONAL VESSEL  09/21/2019   IR ANGIOGRAM SELECTIVE EACH ADDITIONAL VESSEL  09/21/2019   IR ANGIOGRAM SELECTIVE EACH ADDITIONAL VESSEL  09/21/2019   IR ANGIOGRAM SELECTIVE EACH ADDITIONAL VESSEL  09/21/2019   IR ANGIOGRAM SELECTIVE EACH ADDITIONAL VESSEL  10/02/2019   IR ANGIOGRAM VISCERAL SELECTIVE  09/21/2019   IR ANGIOGRAM VISCERAL SELECTIVE  09/21/2019   IR ANGIOGRAM VISCERAL SELECTIVE  10/02/2019   IR EMBO TUMOR ORGAN ISCHEMIA INFARCT INC GUIDE ROADMAPPING  10/02/2019   IR EMBO TUMOR ORGAN  ISCHEMIA INFARCT INC GUIDE ROADMAPPING  10/02/2019   IR RADIOLOGIST EVAL & MGMT  08/28/2019   IR RADIOLOGIST EVAL & MGMT  01/30/2020   IR RADIOLOGIST EVAL & MGMT  04/30/2020   IR RADIOLOGIST EVAL & MGMT  07/30/2020   IR RADIOLOGIST EVAL & MGMT  10/28/2020   IR RADIOLOGIST EVAL & MGMT  02/24/2021   IR RADIOLOGIST EVAL & MGMT  06/08/2021   IR US GUIDE VASC ACCESS LEFT  09/21/2019   IR US GUIDE VASC ACCESS LEFT  10/02/2019    Medical History: Past Medical History:  Diagnosis Date   B12 deficiency 04/05/2017   Blood in stool    Cerebral aneurysm    Chronic kidney disease    Diabetes mellitus without complication (HCC)    Gastric erosion with bleeding    Hyperlipidemia    Hypertension    Lower extremity cellulitis 02/08/2019   Seizure disorder ([place])     Family History: Family History  Problem Relation Age of Onset   Heart attack Brother 60    Social History   Socioeconomic History   Marital status: Divorced    Spouse name: Not on file   Number of children: Not on file   Years of education: Not on file   Highest education level: Not on file  Occupational History   Not on file  Tobacco Use   Smoking status: Never   Smokeless tobacco: Current    Types: Snuff  Vaping Use   Vaping Use: Never used  Substance and Sexual Activity   Alcohol use: Not Currently    Alcohol/week: 14.0 standard drinks    Types: 14 Cans of beer per week    Comment: patient quit in october 2020   Drug use: No   Sexual activity: Not Currently  Other Topics Concern   Not on file  Social History Narrative   Not on file   Social Determinants of Health   Financial Resource Strain: Not on file  Food Insecurity: Not on file  Transportation Needs: Not on file  Physical Activity: Not on file  Stress: Not on file  Social Connections: Not on file  Intimate Partner Violence: Not on file      Review of Systems  Constitutional:  Negative for fatigue and fever.  HENT:  Negative for congestion, mouth  sores and postnasal drip.   Respiratory:  Negative for cough.   Cardiovascular:  Negative for chest pain.  Genitourinary:  Negative for flank pain.  Skin:  Positive for wound.       Nail fell off  Psychiatric/Behavioral: Negative.     Vital Signs: BP 138/70 Comment: 150/101   Pulse 74    Temp 98.3 F (36.8 C)    Resp 16    Ht 5\' 6"  (1.676 m)    Wt 189 lb 6.4 oz (85.9 kg)    SpO2 98%    BMI 30.57 kg/m  Physical Exam Vitals and nursing note reviewed.  Constitutional:      General: She is not in acute distress.    Appearance: She is well-developed. She is not diaphoretic.  HENT:     Head: Normocephalic and atraumatic.  Eyes:     Pupils: Pupils are equal, round, and reactive to light.  Neck:     Thyroid: No thyromegaly.     Vascular: No JVD.     Trachea: No tracheal deviation.  Cardiovascular:     Rate and Rhythm: Normal rate and regular rhythm.     Heart sounds: Normal heart sounds. No murmur heard.   No friction rub. No gallop.  Pulmonary:     Effort: Pulmonary effort is normal. No respiratory distress.     Breath sounds: No wheezing or rales.  Chest:     Chest wall: No tenderness.  Abdominal:     General: Bowel sounds are normal.     Palpations: Abdomen is soft.  Musculoskeletal:        General: Normal range of motion.     Cervical back: Normal range of motion and neck supple.  Lymphadenopathy:     Cervical: No cervical adenopathy.  Skin:    General: Skin is warm and dry.     Comments: Nail missing on 3rd toe of left foot, minor tenderness on palpation, no open cut or discharge, no warmth or surrounding erythema  Neurological:     Mental Status: She is alert and oriented to person, place, and time.     Cranial Nerves: No cranial nerve deficit.  Psychiatric:        Behavior: Behavior normal.        Thought Content: Thought content normal.        Judgment: Judgment normal.       Assessment/Plan: 1. Traumatic loss of toenail of lesser toe of left foot,  initial encounter Educated on signs and symptoms of infection to watch out for and discussed wound management.  Patient will call office for any worsening as antibiotics may be indicated. Will refer to podiatry for further management and nail care - Ambulatory referral to Podiatry  2. 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 I have discussed any further diagnostic evaluation that may be needed or ordered today. We also reviewed her medications today. she has been encouraged to call the office with any questions or concerns that should arise related to todays visit.    Orders Placed This Encounter  Procedures   Ambulatory referral to Podiatry    No orders of the defined types were placed in this encounter.   This patient was seen by Drema Dallas, PA-C in collaboration with Dr. Clayborn Bigness as a part of collaborative care agreement.   Total time spent:25 Minutes Time spent includes review of chart, medications, test results, and follow up plan with the patient.      Dr Lavera Guise Internal medicine

## 2021-06-08 NOTE — Progress Notes (Signed)
Chief Complaint: Patient was consulted remotely today (TeleHealth) for EtOH cirrhosis complicated by hepatocellular cancer at the request of Gerrell Tabet K.    Referring Physician(s): Dr. Earlie Server  History of Present Illness: Adriana Miller is a 66 y.o. 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  She presented with a 2.0 cm segment II and 1.7 cm segment III mass and underwent split dose radiation segmentectomies on 10/02/19.     Her recovery was uneventful.  Initial post therapy liver MRI dated 01/03/2020 demonstrated an excellent response to therapy with no evidence of residual disease in the segment 2 lesion and equivocal residual enhancement in the segment 3 lesion.   MRI dated 04/14/2020 demonstrates continued treatment response to lesions in both the segment 2 and segment 3 with no evidence of residual viable tumor.  No new hepatic lesions are identified.   MRI 07/02/20: Treated hepatic segment II lesion  LR TR nonviable. Treated hepatic segment III lesion LR TR nonviable.  No new suspicious hepatic lesions.   MRI 10/25/20: Treated lesions in segments 2 and 3 of the liver again demonstrate no evidence of recurrence/viability. No new hepatic lesions are otherwise noted.     MRI 02/06/21: Redemonstrated, well-circumscribed, nonenhancing lesion of hepatic segment II, consistent with treated, nonviable hepatocellular carcinoma following embolization and radiotherapy.  Additional lesion of hepatic segment III previously reported is not appreciated on this examination.  MRI 06/05/21 -not yet officially interpreted, however I have independently reviewed the images and identify no evidence of residual or recurrent disease.  No new lesions are identified.     Adriana Miller reports that she is doing well and has no active complaints at this time.  Past Medical History:  Diagnosis Date   B12 deficiency 04/05/2017   Blood in  stool    Cerebral aneurysm    Chronic kidney disease    Diabetes mellitus without complication ([place])    Gastric erosion with bleeding    Hyperlipidemia    Hypertension    Lower extremity cellulitis 02/08/2019   Seizure disorder [facility])     Past Surgical History:  Procedure Laterality Date   CEREBRAL ANEURYSM REPAIR  1991   COLONOSCOPY WITH PROPOFOL N/A 02/12/2019   Procedure: COLONOSCOPY WITH PROPOFOL;  Surgeon: Lin Landsman, MD;  Location: [facility] ENDOSCOPY;  Service: Gastroenterology;  Laterality: N/A;   ESOPHAGOGASTRODUODENOSCOPY (EGD) WITH PROPOFOL N/A 02/12/2019   Procedure: ESOPHAGOGASTRODUODENOSCOPY (EGD) WITH PROPOFOL;  Surgeon: Lin Landsman, MD;  Location: [facility] ENDOSCOPY;  Service: Gastroenterology;  Laterality: N/A;   IR ANGIOGRAM SELECTIVE EACH ADDITIONAL VESSEL  09/21/2019   IR ANGIOGRAM SELECTIVE EACH ADDITIONAL VESSEL  09/21/2019   IR ANGIOGRAM SELECTIVE EACH ADDITIONAL VESSEL  09/21/2019   IR ANGIOGRAM SELECTIVE EACH ADDITIONAL VESSEL  09/21/2019   IR ANGIOGRAM SELECTIVE EACH ADDITIONAL VESSEL  10/02/2019   IR ANGIOGRAM VISCERAL SELECTIVE  09/21/2019   IR ANGIOGRAM VISCERAL SELECTIVE  09/21/2019   IR ANGIOGRAM VISCERAL SELECTIVE  10/02/2019   IR EMBO TUMOR ORGAN ISCHEMIA INFARCT INC GUIDE ROADMAPPING  10/02/2019   IR EMBO TUMOR ORGAN ISCHEMIA INFARCT INC GUIDE ROADMAPPING  10/02/2019   IR RADIOLOGIST EVAL & MGMT  08/28/2019   IR RADIOLOGIST EVAL & MGMT  01/30/2020   IR RADIOLOGIST EVAL & MGMT  04/30/2020   IR RADIOLOGIST EVAL & MGMT  07/30/2020   IR RADIOLOGIST EVAL & MGMT  10/28/2020   IR RADIOLOGIST EVAL & MGMT  02/24/2021   IR US GUIDE VASC ACCESS LEFT  09/21/2019   IR US GUIDE VASC ACCESS LEFT  10/02/2019    Allergies: Aspirin  Medications: Prior to Admission medications   Medication Sig Start Date End Date Taking? Authorizing Provider  ACCU-CHEK GUIDE test strip USE AS INSTRUCTED ONCE A DAILY 05/22/21   McDonough, Lauren K, PA-C  allopurinol (ZYLOPRIM) 100 MG tablet  TAKE 1 TABLET BY MOUTH EVERY DAY 04/06/21   McDonough, Si Gaul, PA-C  apixaban (ELIQUIS) 2.5 MG TABS tablet Take 1 tablet (2.5 mg total) by mouth 2 (two) times daily. 05/11/21   McDonough, Si Gaul, PA-C  BD PEN NEEDLE NANO 2ND GEN 32G X 4 MM MISC USE AS DIRECTED WITH TOUJEO 04/09/21   McDonough, Lauren K, PA-C  escitalopram (LEXAPRO) 10 MG tablet TAKE 1 TABLET (10 MG TOTAL) BY MOUTH DAILY. FOR ANXIETY/SLEEP 05/22/21   McDonough, Si Gaul, PA-C  furosemide (LASIX) 20 MG tablet Take 1 tablet (20 mg total) by mouth daily. PLEAS CALL OFFICE TO SCHEDULE APPOINTMENT FOR FURTHER REFILLS. THANK YOU! 11/03/20   End, Harrell Gave, MD  glimepiride (AMARYL) 1 MG tablet TAKE 1 TABLET BY MOUTH DAILY WITH BREAKFAST. 11/03/20   Lavera Guise, MD  insulin glargine, 1 Unit Dial, (TOUJEO SOLOSTAR) 300 UNIT/ML Solostar Pen INJECT 30 UNITS SUBCUTANEOUSLY IN THE EVENING WITH FOOD 04/06/21   McDonough, Si Gaul, PA-C  metoprolol tartrate (LOPRESSOR) 25 MG tablet Take 0.5 tablets (12.5 mg total) by mouth 2 (two) times daily. 07/04/20   Mylinda Latina, PA-C  Safety Lancets 28G MISC Use as directed once a daily Dx e11.65 02/04/20   Luiz Ochoa, NP  spironolactone (ALDACTONE) 25 MG tablet TAKE 1 TABLET (25 MG TOTAL) BY MOUTH DAILY. FOR CIRRHOSIS OF LIVER 04/05/21   McDonough, Si Gaul, PA-C  traZODone (DESYREL) 50 MG tablet TAKE 1-2 TABS BY MOUTH AT NIGHT BEFORE BED FOR INSOMNIA 05/19/21   McDonough, Si Gaul, PA-C     Family History  Problem Relation Age of Onset   Heart attack Brother 86    Social History   Socioeconomic History   Marital status: Divorced    Spouse name: Not on file   Number of children: Not on file   Years of education: Not on file   Highest education level: Not on file  Occupational History   Not on file  Tobacco Use   Smoking status: Never   Smokeless tobacco: Current    Types: Snuff  Vaping Use   Vaping Use: Never used  Substance and Sexual Activity   Alcohol use: Not Currently     Alcohol/week: 14.0 standard drinks    Types: 14 Cans of beer per week    Comment: patient quit in october 2020   Drug use: No   Sexual activity: Not Currently  Other Topics Concern   Not on file  Social History Narrative   Not on file   Social Determinants of Health   Financial Resource Strain: Not on file  Food Insecurity: Not on file  Transportation Needs: Not on file  Physical Activity: Not on file  Stress: Not on file  Social Connections: Not on file    ECOG Status: 1 - Symptomatic but completely ambulatory  Review of Systems  Review of Systems: A 12 point ROS discussed and pertinent positives are indicated in the HPI above.  All other systems are negative.  Physical Exam No direct physical exam was performed (except for noted visual exam findings with Video Visits).    Vital Signs: There were no vitals taken for  this visit.  Imaging: No results found.  Labs:  CBC: Recent Labs    07/02/20 1306 09/30/20 1608  WBC 7.1 10.4  HGB 12.1 14.2  HCT 37.3 43.1  PLT 140* 179    COAGS: No results for input(s): INR, APTT in the last 8760 hours.  BMP: Recent Labs    07/02/20 1306 09/30/20 1608  NA 139 137  K 3.4* 4.1  CL 102 102  CO2 26 28  GLUCOSE 206* 269*  BUN 17 30*  CALCIUM 8.9 8.9  CREATININE 1.04* 1.17*  GFRNONAA 59* 51*    LIVER FUNCTION TESTS: Recent Labs    07/02/20 1306 09/30/20 1608  BILITOT 0.8 1.0  AST 30 44*  ALT 20 45*  ALKPHOS 99 116  PROT 7.8 8.1  ALBUMIN 3.2* 3.5    TUMOR MARKERS: No results for input(s): AFPTM, CEA, CA199, CHROMGRNA in the last 8760 hours.  Assessment and Plan:  Pleasant 66 year old female who continues to do very well nearly 2 years status post radiation segmentectomy's of hepatic segments 2 and 3 for hepatocellular carcinoma.  She has thus far experienced a complete response to therapy with no evidence of residual, recurrent or new disease on follow-up surveillance MRI.  We will continue close  surveillance monitoring every 3 months for the first 2 years.  After 2 years of stability, we will transition to surveillance imaging every 6 months.   1.)  Gadolinium enhanced MRI of the abdomen and clinic visit in 3 months (June 2023).   Electronically Signed: Criselda Peaches 06/08/2021, 11:37 AM   I spent a total of  15 Minutes in remote  clinical consultation, greater than 50% of which was counseling/coordinating care for hepatocellular cancer.    Visit type: Audio only (telephone). Audio (no video) only due to patient's lack of internet/smartphone capability. Alternative for in-person consultation at [facility], [address], [place], [place]. This visit type was conducted due to national recommendations for restrictions regarding the COVID-19 Pandemic (e.g. social distancing).  This format is felt to be most appropriate for this patient at this time.  All issues noted in this document were discussed and addressed.

## 2021-06-11 ENCOUNTER — Other Ambulatory Visit: Payer: Medicare Other | Admitting: Primary Care

## 2021-06-11 ENCOUNTER — Other Ambulatory Visit: Payer: Self-pay

## 2021-06-11 DIAGNOSIS — Z515 Encounter for palliative care: Secondary | ICD-10-CM

## 2021-06-11 DIAGNOSIS — R9431 Abnormal electrocardiogram [ECG] [EKG]: Secondary | ICD-10-CM

## 2021-06-11 DIAGNOSIS — K7460 Unspecified cirrhosis of liver: Secondary | ICD-10-CM

## 2021-06-11 DIAGNOSIS — I5032 Chronic diastolic (congestive) heart failure: Secondary | ICD-10-CM | POA: Diagnosis not present

## 2021-06-11 NOTE — Progress Notes (Signed)
[facility] Palliative Care Consult Note Telephone: [phone]  Fax: [phone]    Date of encounter: 06/11/21 1:41 PM PATIENT NAME: Adriana Miller [address] [place] [address]   [phone] (home)  DOB: 1955/11/19 MRN: [id number] PRIMARY CARE PROVIDER:    Carolynne Edouard,  [place] [address] [phone]  REFERRING PROVIDER:   Carolynne Edouard [place],  [place] [address] [phone]  RESPONSIBLE PARTY:    Contact Information     Name Relation Home Work Cameron Brother [phone]  [phone]        I met face to face with patient in  home. Palliative Care was asked to follow this patient by consultation request of  McDonough, Si Gaul, PA* to address advance care planning and complex medical decision making. This is a follow up visit.                                   ASSESSMENT AND PLAN / RECOMMENDATIONS:   Advance Care Planning/Goals of Care: Goals include to maximize quality of life and symptom management.  Code status: Full code  Symptom Management/Plan:  Wound on toe: Obtained a wound to left toe that has been medically evaluated. Instructed her to keep the wound clean, walk with supportive shoes, and report and fever or chills.  ADLs: Moves around her home well with a walker. Does not cook unless someone is with her but does have meals to microwave if needed. No falls at home. She has help to assists with cleaning and cooking.  T2DM; HgA1C has drastically improved since starting insulin therapy. She understands glucose monitoring and s/s of hypoglycemia and to eat a small chocolate if she experiences this.  Latest A1C 6.8  Liver disease: Appears stable per recent scan. Patient very encouraged.     Follow up Palliative Care Visit: Palliative care will continue to follow for complex medical decision making, advance care planning, and clarification of  goals. Return 3 months  or prn.  I spent 40 minutes providing this consultation. More than 50% of the time in this consultation was spent in counseling and care coordination.  This visit was coded based on medical decision making (MDM).  PPS: 50%  HOSPICE ELIGIBILITY/DIAGNOSIS: TBD  Chief Complaint: Palliative Care follow up  HISTORY OF PRESENT ILLNESS:  Adriana Miller is a 66 y.o. year old female  with liver disease, debility, DM .  She is doing well today and in good spirits. She has not been in pain, has maintained a good appetite, and mobility. She enjoys visits from her granddaughter who is in college in [place]. She just recently got a good report about her liver disease and was very pleased with this. She has not had headaches, SOB, CP, nausea, changes in bowel habits, lightheadedness or dizziness.   History obtained from review of EMR, discussion with primary team, and interview with family, facility staff/caregiver and/or Ms. Bojanowski.  I reviewed available labs, medications, imaging, studies and related documents from the EMR.  Records reviewed and summarized above.   ROS  General: NAD EYES: denies vision changes ENMT: denies dysphagia Cardiovascular: denies chest pain, denies DOE Pulmonary: denies increased SOB Abdomen: endorses good appetite, endorses continence of bowel, normal bowel habits.  GU: denies dysuria or hematuria.  MSK:  denies  increased weakness,  no falls reported Skin:  new wound on toe of left foot from shoes rubbing. Neurological: denies pain, and Headaches Psych: Endorses positive mood Heme/lymph/immuno: denies bruises, abnormal bleeding  Physical Exam: Current and past weights: 189 lbs per Epic record Constitutional: NAD EYES: anicteric sclera, lids intact, no discharge  ENMT: intact hearing, oral mucous membranes moist, dentition missing CV:  no LE edema Pulmonary: no increased work of breathing, no cough, room air Abdomen: intake 100%,  ascites GU: deferred MSK: no sarcopenia, moves all extremities, ambulatory with walker Skin: warm and dry, no rashes or wounds on visible skin. Skin tear to toe on left foot. Neuro:  no  new generalized weakness,  no cognitive impairment Psych: non-anxious affect, A and O x 3 Hem/lymph/immuno: no widespread bruising   Thank you for the opportunity to participate in the care of Ms. Jardin.  The palliative care team will continue to follow. Please call our office at [phone] if we can be of additional assistance.   Jason Coop, NP DNP, AGPCNP-BC  COVID-19 PATIENT SCREENING TOOL Asked and negative response unless otherwise noted:   Have you had symptoms of covid, tested positive or been in contact with someone with symptoms/positive test in the past 5-10 days?

## 2021-06-30 ENCOUNTER — Ambulatory Visit: Payer: Medicare Other | Admitting: Podiatry

## 2021-07-07 ENCOUNTER — Other Ambulatory Visit: Payer: Self-pay | Admitting: Internal Medicine

## 2021-07-07 ENCOUNTER — Other Ambulatory Visit: Payer: Self-pay | Admitting: Physician Assistant

## 2021-07-07 DIAGNOSIS — E1129 Type 2 diabetes mellitus with other diabetic kidney complication: Secondary | ICD-10-CM

## 2021-07-23 ENCOUNTER — Other Ambulatory Visit: Payer: Self-pay

## 2021-07-23 ENCOUNTER — Emergency Department: Payer: Medicare Other

## 2021-07-23 ENCOUNTER — Encounter: Payer: Self-pay | Admitting: Emergency Medicine

## 2021-07-23 ENCOUNTER — Emergency Department
Admission: EM | Admit: 2021-07-23 | Discharge: 2021-07-23 | Disposition: A | Discharge status: Home / Self Care | Payer: Medicare Other | Attending: Emergency Medicine | Admitting: Emergency Medicine

## 2021-07-23 DIAGNOSIS — I4891 Unspecified atrial fibrillation: Secondary | ICD-10-CM | POA: Diagnosis not present

## 2021-07-23 DIAGNOSIS — Z7901 Long term (current) use of anticoagulants: Secondary | ICD-10-CM | POA: Insufficient documentation

## 2021-07-23 DIAGNOSIS — M7989 Other specified soft tissue disorders: Secondary | ICD-10-CM | POA: Diagnosis not present

## 2021-07-23 DIAGNOSIS — S59911A Unspecified injury of right forearm, initial encounter: Secondary | ICD-10-CM | POA: Diagnosis present

## 2021-07-23 DIAGNOSIS — R5381 Other malaise: Secondary | ICD-10-CM | POA: Diagnosis not present

## 2021-07-23 DIAGNOSIS — S0990XA Unspecified injury of head, initial encounter: Secondary | ICD-10-CM | POA: Insufficient documentation

## 2021-07-23 DIAGNOSIS — N189 Chronic kidney disease, unspecified: Secondary | ICD-10-CM | POA: Diagnosis not present

## 2021-07-23 DIAGNOSIS — S52501A Unspecified fracture of the lower end of right radius, initial encounter for closed fracture: Secondary | ICD-10-CM | POA: Insufficient documentation

## 2021-07-23 DIAGNOSIS — I129 Hypertensive chronic kidney disease with stage 1 through stage 4 chronic kidney disease, or unspecified chronic kidney disease: Secondary | ICD-10-CM | POA: Diagnosis not present

## 2021-07-23 DIAGNOSIS — S199XXA Unspecified injury of neck, initial encounter: Secondary | ICD-10-CM | POA: Diagnosis not present

## 2021-07-23 DIAGNOSIS — R7989 Other specified abnormal findings of blood chemistry: Secondary | ICD-10-CM | POA: Diagnosis not present

## 2021-07-23 DIAGNOSIS — W01198A Fall on same level from slipping, tripping and stumbling with subsequent striking against other object, initial encounter: Secondary | ICD-10-CM | POA: Diagnosis not present

## 2021-07-23 DIAGNOSIS — M25531 Pain in right wrist: Secondary | ICD-10-CM | POA: Diagnosis not present

## 2021-07-23 DIAGNOSIS — Z743 Need for continuous supervision: Secondary | ICD-10-CM | POA: Diagnosis not present

## 2021-07-23 DIAGNOSIS — S6990XA Unspecified injury of unspecified wrist, hand and finger(s), initial encounter: Secondary | ICD-10-CM | POA: Diagnosis not present

## 2021-07-23 DIAGNOSIS — M5030 Other cervical disc degeneration, unspecified cervical region: Secondary | ICD-10-CM | POA: Diagnosis not present

## 2021-07-23 LAB — BASIC METABOLIC PANEL
Anion gap: 11 (ref 5–15)
BUN: 12 mg/dL (ref 8–23)
CO2: 23 mmol/L (ref 22–32)
Calcium: 8.2 mg/dL — ABNORMAL LOW (ref 8.9–10.3)
Chloride: 101 mmol/L (ref 98–111)
Creatinine, Ser: 1.34 mg/dL — ABNORMAL HIGH (ref 0.44–1.00)
GFR, Estimated: 43 mL/min — ABNORMAL LOW (ref >60)
Glucose, Bld: 159 mg/dL — ABNORMAL HIGH (ref 70–99)
Potassium: 3.4 mmol/L — ABNORMAL LOW (ref 3.5–5.1)
Sodium: 135 mmol/L (ref 135–145)

## 2021-07-23 LAB — CBC WITH DIFFERENTIAL/PLATELET
Abs Immature Granulocytes: 0.02 10*3/uL (ref 0.00–0.07)
Basophils Absolute: 0 10*3/uL (ref 0.0–0.1)
Basophils Relative: 0 %
Eosinophils Absolute: 0 10*3/uL (ref 0.0–0.5)
Eosinophils Relative: 0 %
HCT: 41 % (ref 36.0–46.0)
Hemoglobin: 13.3 g/dL (ref 12.0–15.0)
Immature Granulocytes: 0 %
Lymphocytes Relative: 29 %
Lymphs Abs: 1.5 10*3/uL (ref 0.7–4.0)
MCH: 31.7 pg (ref 26.0–34.0)
MCHC: 32.4 g/dL (ref 30.0–36.0)
MCV: 97.6 fL (ref 80.0–100.0)
Monocytes Absolute: 0.4 10*3/uL (ref 0.1–1.0)
Monocytes Relative: 8 %
Neutro Abs: 3.2 10*3/uL (ref 1.7–7.7)
Neutrophils Relative %: 63 %
Platelets: 169 10*3/uL (ref 150–400)
RBC: 4.2 MIL/uL (ref 3.87–5.11)
RDW: 14.8 % (ref 11.5–15.5)
WBC: 5.2 10*3/uL (ref 4.0–10.5)
nRBC: 0 % (ref 0.0–0.2)

## 2021-07-23 LAB — PROTIME-INR
INR: 1.3 — ABNORMAL HIGH (ref 0.8–1.2)
Prothrombin Time: 15.8 seconds — ABNORMAL HIGH (ref 11.4–15.2)

## 2021-07-23 IMAGING — DX DG WRIST COMPLETE 3+V*R*
4 series · 4 of 4 positions shown · non-contrast
Comparison: None.

CLINICAL DATA: Fall, RIGHT wrist pain

EXAM:
RIGHT WRIST - COMPLETE 3+ VIEW

[wrist ap (1 of 2)]
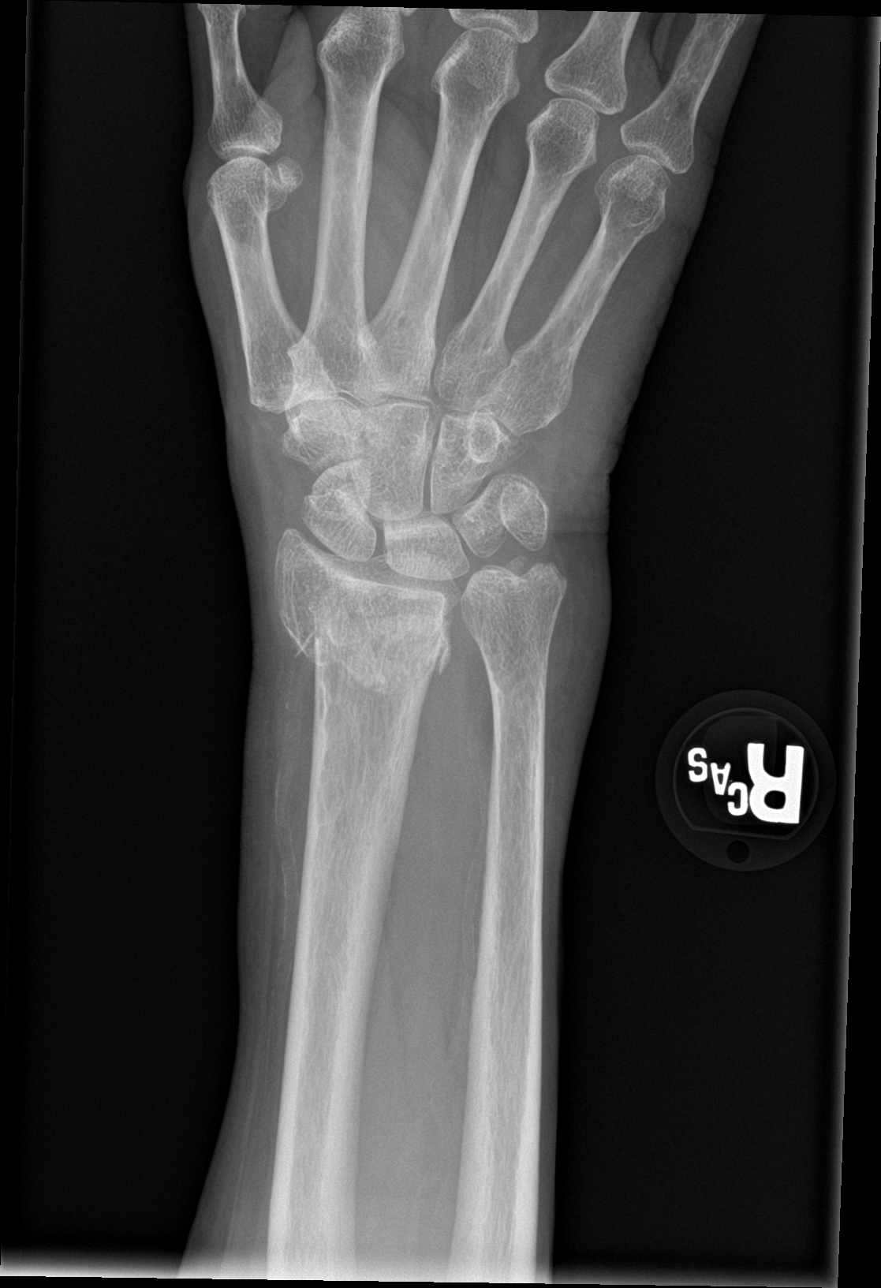

[wrist obl]
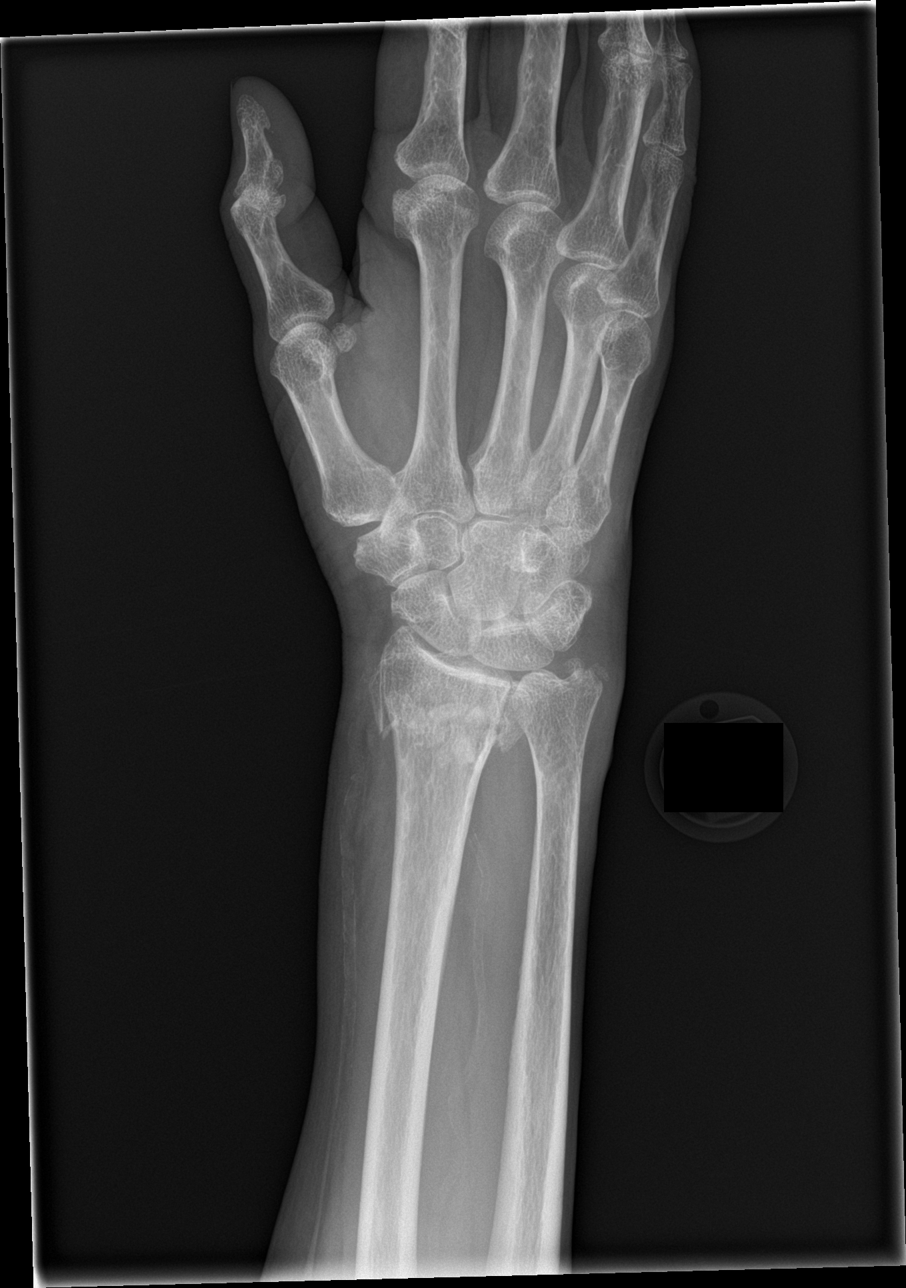

[wrist lat]
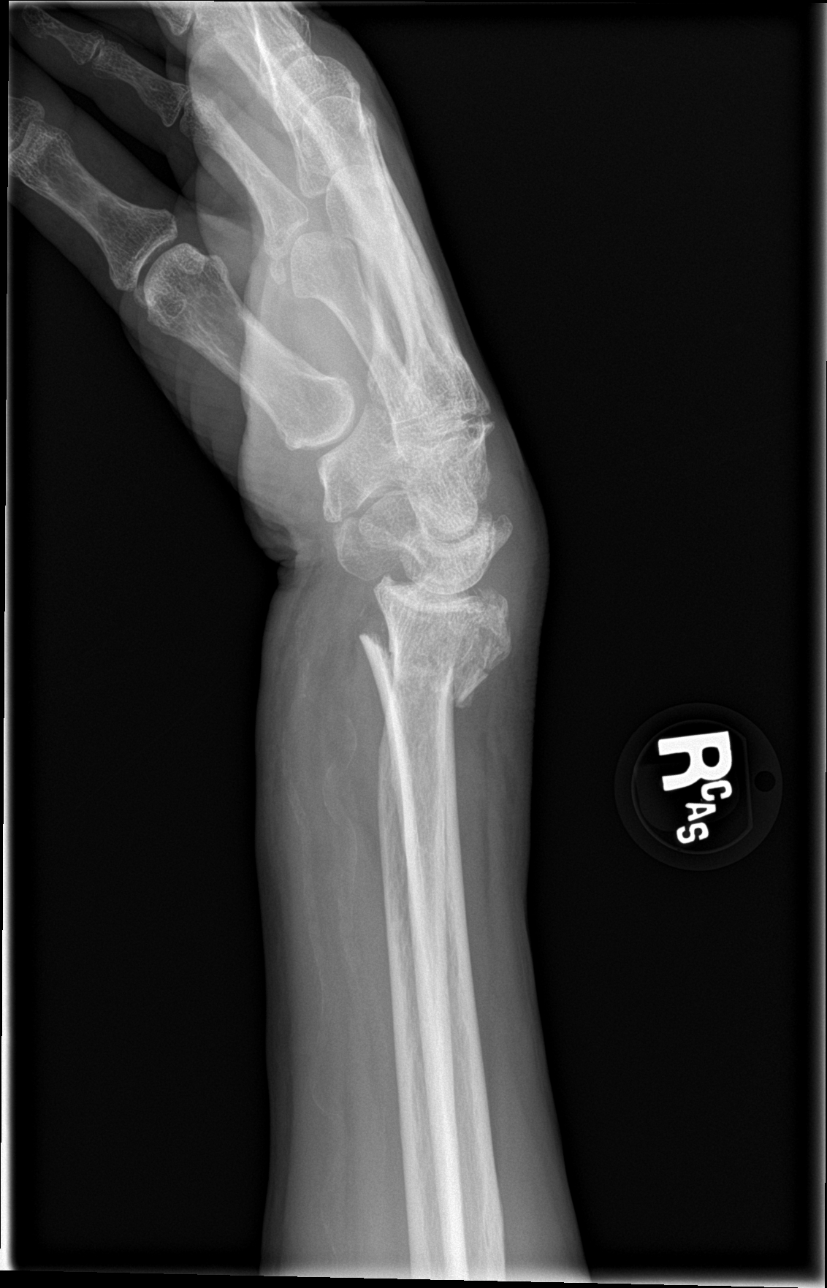

[wrist ap (2 of 2)]
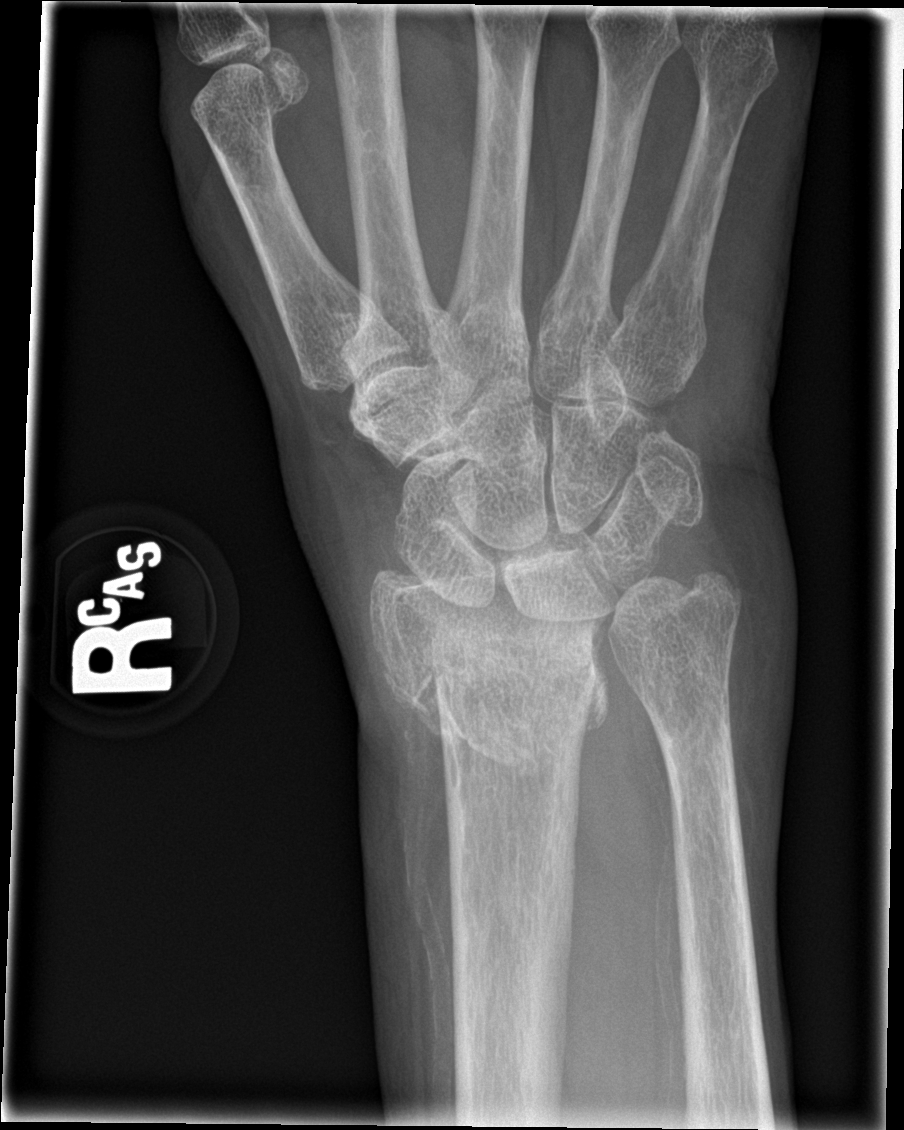

[4 of 4 positions shown; findings below may reference images not displayed]

FINDINGS: Impaction fracture of the distal radius. There is dorsal angulation.
Radiocarpal joint is intact. Mild comminution along the impaction
margin. No carpal bone fracture
IMPRESSION: Compaction fracture of the distal radius with comminution and
angulation. Radiocarpal joint intact.

## 2021-07-23 IMAGING — DX DG WRIST COMPLETE 3+V*R*
3 series · 3 of 3 positions shown · non-contrast
Comparison: [DATE] p.m.

CLINICAL DATA: Right wrist fracture, post reduction imaging

EXAM:
RIGHT WRIST - COMPLETE 3+ VIEW

[wrist ap]
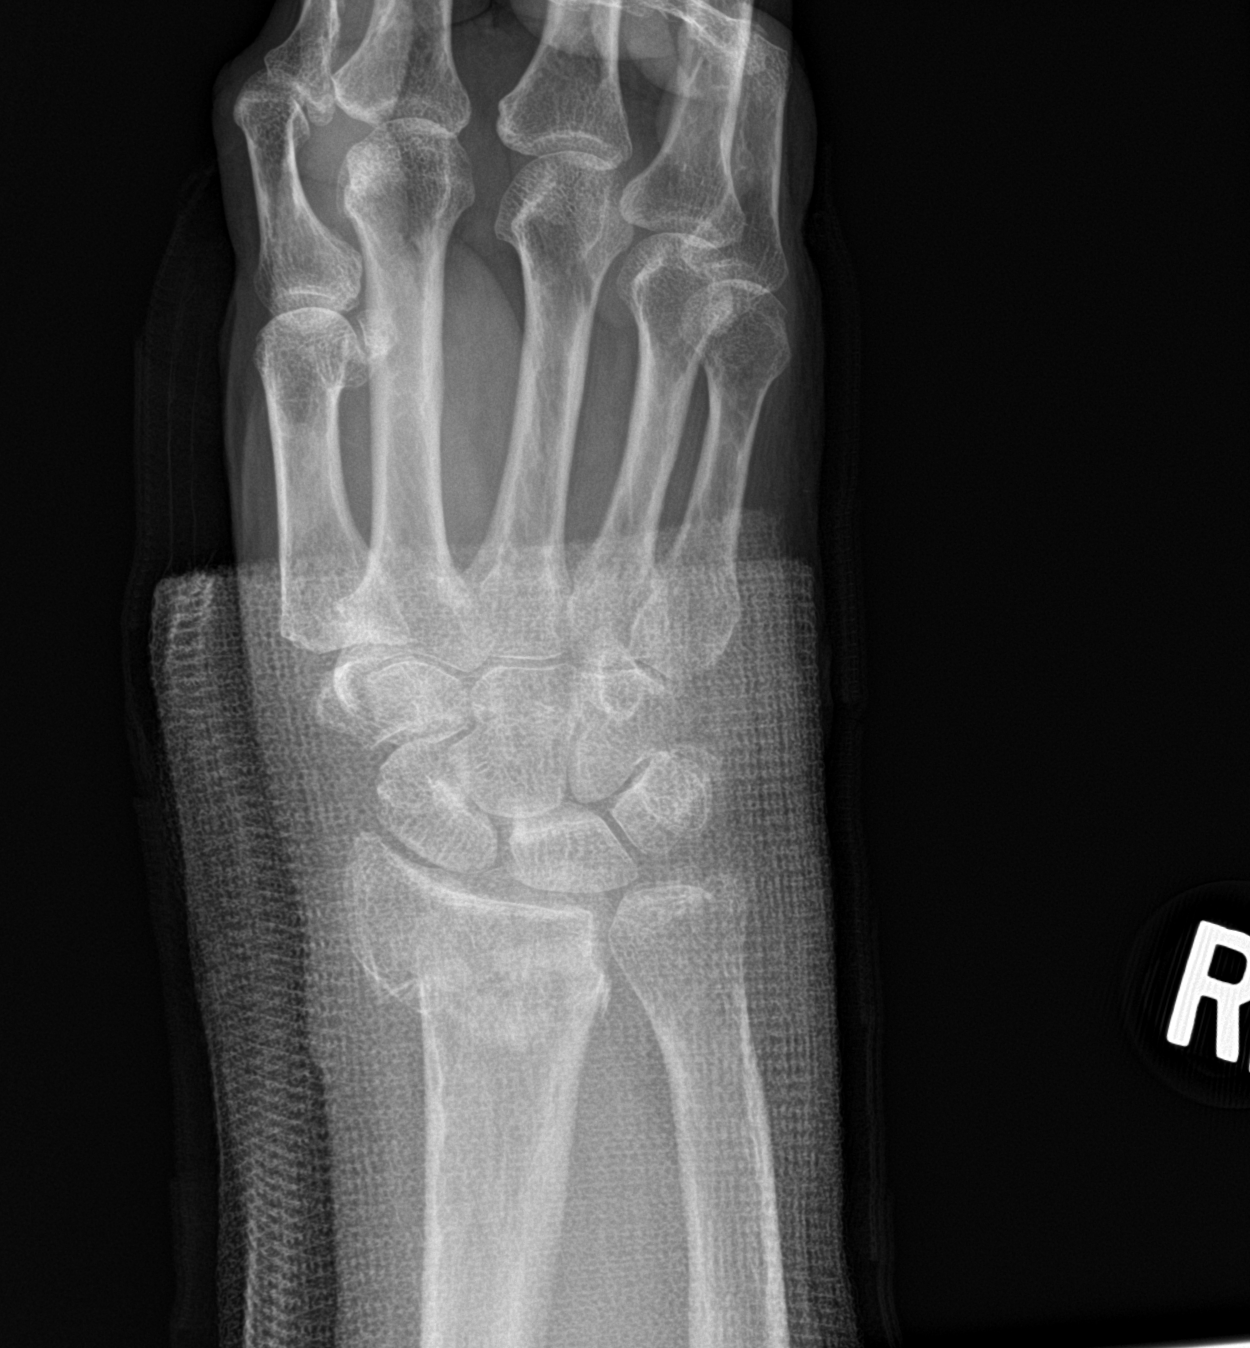

[wrist obl]
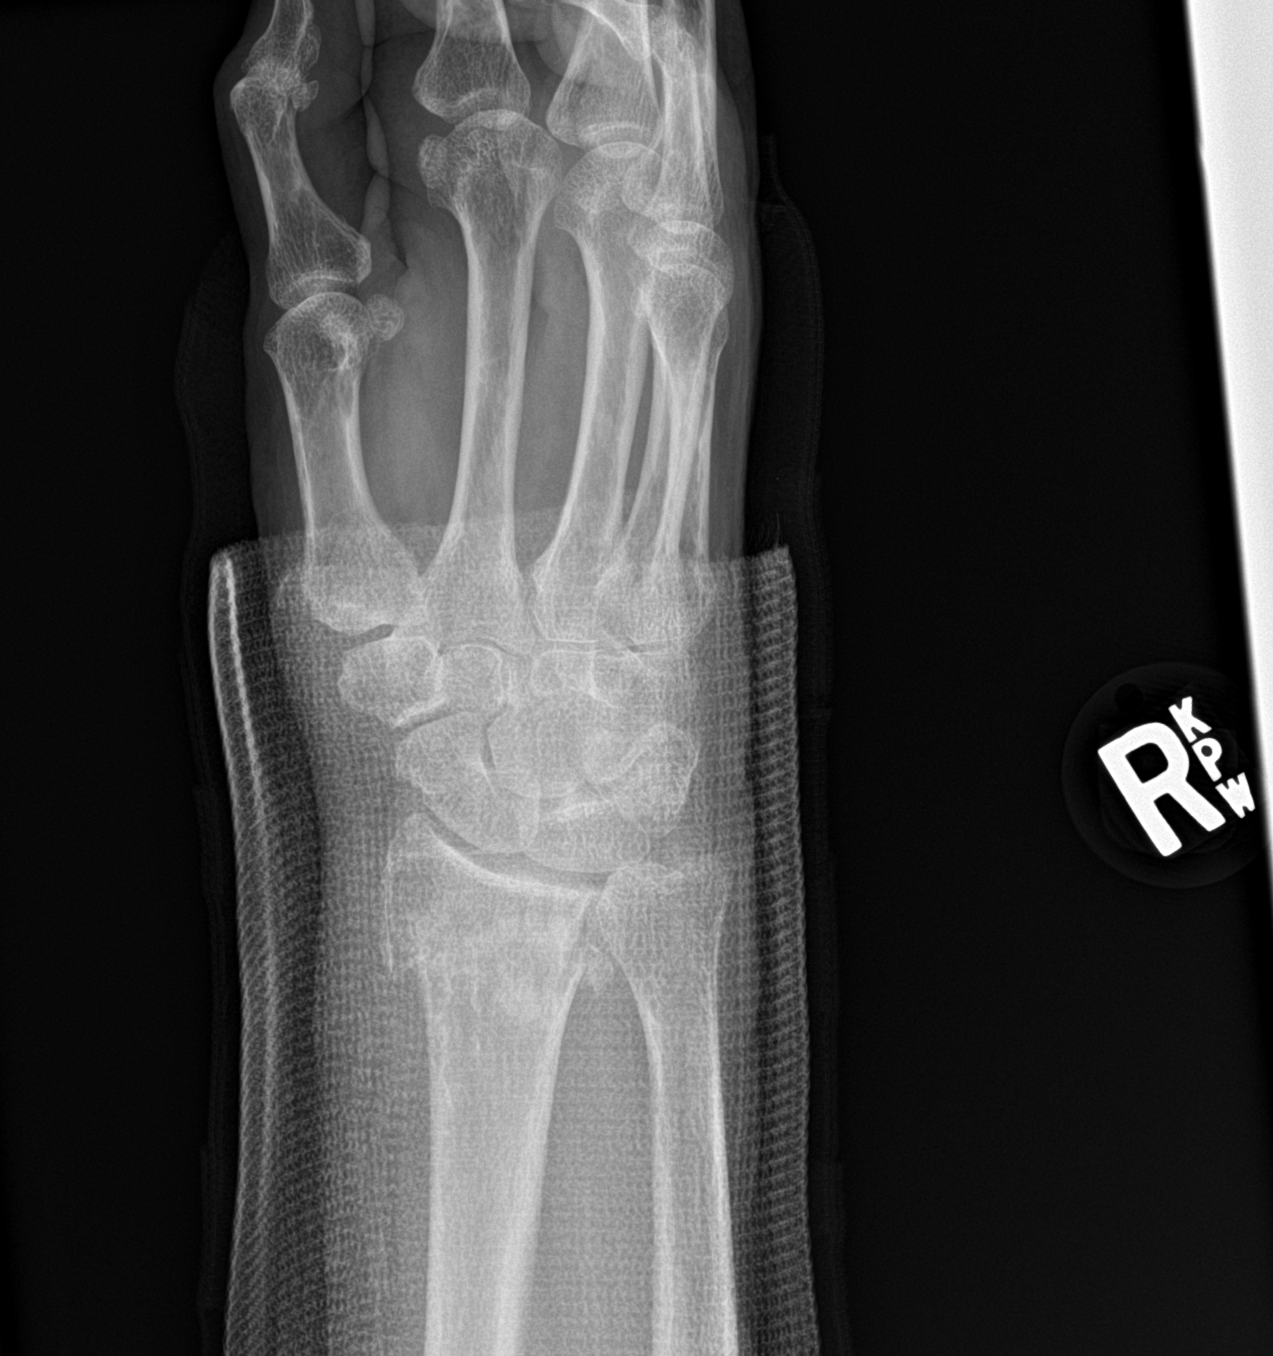

[wrist lat]
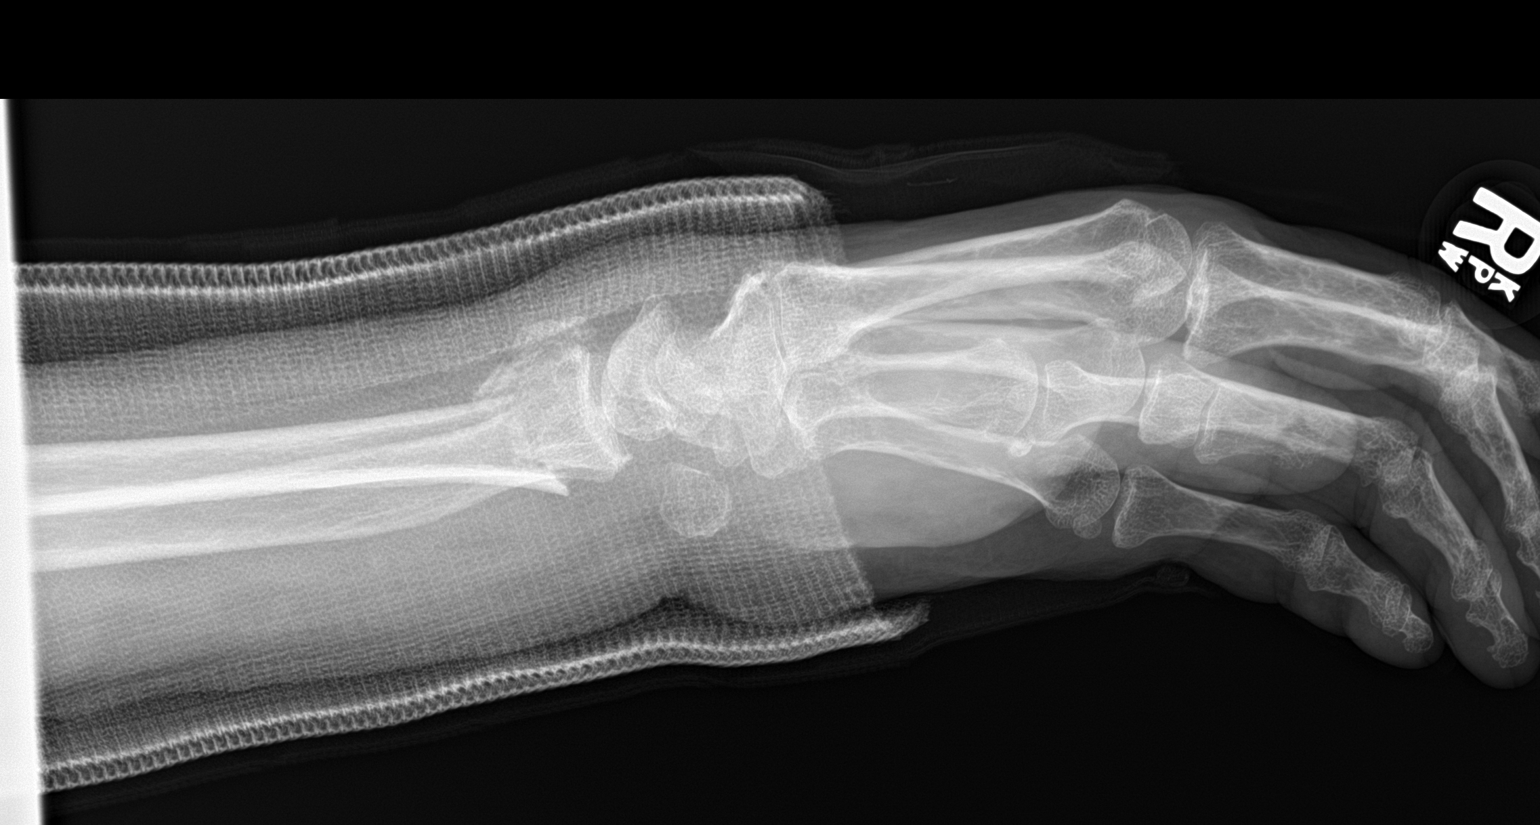

[3 of 3 positions shown; findings below may reference images not displayed]

FINDINGS: Since the prior examination and external immobilizer has been placed
which obscures fine bony detail. A mildly comminuted, impacted,
transverse fracture of the distal right radial metaphysis is again
identified with 2 cortical with dorsal displacement, mild dorsal
angulation, and loss of the normal radial inclination of the distal
radial articular surface. There is neutral tilt of the distal radial
articular surface. These findings appear unchanged from prior
examination. Moderate surrounding soft tissue swelling.
IMPRESSION: Impacted, angulated, comminuted fracture of the distal right radius
as described above. Little interval change since prior examination.

## 2021-07-23 MED ORDER — TRAMADOL HCL 50 MG PO TABS: 50.0000 mg | ORAL_TABLET | Freq: Four times a day (QID) | ORAL | 0 refills | Status: AC | PRN

## 2021-07-23 MED ORDER — LIDOCAINE HCL (PF) 1 % IJ SOLN
INTRAMUSCULAR | Status: AC
  Filled 2021-07-23: qty 5

## 2021-07-23 NOTE — Discharge Instructions (Signed)
Keep the splint on at all times.  Keep it covered and dry when bathing. ? ?Follow-up with the orthopedist next week. ? ?Return to the ER for new or worsening pain, swelling, numbness, or any other new or worsening symptoms that concern you. ?

## 2021-07-23 NOTE — ED Notes (Signed)
R hand placed in finger traction per MD request ?

## 2021-07-23 NOTE — ED Notes (Signed)
Pt states she would like to wait for brother, Hedy Camara, in [place] ?

## 2021-07-23 NOTE — ED Notes (Signed)
Pt at [facility] ?

## 2021-07-23 NOTE — ED Notes (Signed)
Pt ambulated to and from toilet with minimal assistance from this tech. Pt back in bed at this time with no further needs. ?

## 2021-07-23 NOTE — ED Triage Notes (Signed)
Pt in via EMS from home with c/o fall. Pt slipped getting ut of the shower landed on her left wrist, deformity noted. Pt also hit head, no LOC but does take blood thinner, coumadin.  ?FSBS 150, HR 69, 96% RA, 130/60 ?

## 2021-07-23 NOTE — ED Notes (Signed)
Pt presents to ED with c/o of falling today PTA while getting out of the shower. Pt denies any dizziness or preceding factor that caused this fall. Pt states she does take blood thinners but is unsure if she hit her head or not. Per EMS pt had a R wrist deformity, EMS splinted this wrist so this RN is unable to see the extent of deformity at this time. Pt denies numbness or tingling, pt is able to move all fingers and denies any numbness or tingling. Pt has strong R radial pulse at this time. Pt is A&Ox4 at this time.  ?

## 2021-07-23 NOTE — ED Provider Notes (Signed)
? ?[facility] ?Provider Note ? ? ? Event Date/Time  ? First MD Initiated Contact with Patient 07/23/21 1540   ?  (approximate) ? ? ?History  ? ?Fall ? ? ?HPI ? ?Adriana Miller is a 66 y.o. female with history of diabetes, atrial fibrillation on Eliquis, hypertension, hyperlipidemia, chronic kidney disease, cirrhosis, hepatocellular carcinoma, seizure disorder, and cerebral aneurysm who presents with a right wrist injury, acute onset this afternoon when she slipped and fell coming out of the shower.  The patient states that she also hit her head but it was a "lightheaded."  She did not lose consciousness.  She denies neck or back pain, or any pain except in her right wrist. ? ? ? ?Physical Exam  ? ?Triage Vital Signs: ?ED Triage Vitals  ?Enc Vitals Group  ?   BP 07/23/21 1537 133/69  ?   Pulse Rate 07/23/21 1537 78  ?   Resp 07/23/21 1537 19  ?   Temp 07/23/21 1537 97.8 ?F (36.6 ?C)  ?   Temp Source 07/23/21 1537 Oral  ?   SpO2 07/23/21 1537 98 %  ?   Weight 07/23/21 1536 189 lb 6 oz (85.9 kg)  ?   Height 07/23/21 1536 '5\' 6"'$  (1.676 m)  ?   Head Circumference --   ?   Peak Flow --   ?   Pain Score 07/23/21 1536 3  ?   Pain Loc --   ?   Pain Edu? --   ?   Excl. in [place]? --   ? ? ?Most recent vital signs: ?Vitals:  ? 07/23/21 1537 07/23/21 1630  ?BP: 133/69 130/70  ?Pulse: 78 70  ?Resp: 19 (!) 24  ?Temp: 97.8 ?F (36.6 ?C)   ?SpO2: 98% 99%  ? ? ? ?General: Alert and oriented, relatively comfortable appearing. ?CV:  Good peripheral perfusion.  ?Resp:  Normal effort.  ?Abd:  No distention.  ?Other:  Right wrist deformity.  2+ radial pulse.  Cap refill less than 2 seconds distally.  Motor and sensory intact in median, radial, and ulnar distributions in right hand.  Full range of motion at elbow.  No midline cervical, thoracic, or lumbar spine tenderness.  EOMI.  PERRLA.  Motor and sensory intact in all extremities. ? ? ?ED Results / Procedures / Treatments  ? ?Labs ?(all labs ordered are listed, but  only abnormal results are displayed) ?Labs Reviewed  ?BASIC METABOLIC PANEL - Abnormal; Notable for the following components:  ?    Result Value  ? Potassium 3.4 (*)   ? Glucose, Bld 159 (*)   ? Creatinine, Ser 1.34 (*)   ? Calcium 8.2 (*)   ? GFR, Estimated 43 (*)   ? All other components within normal limits  ?PROTIME-INR - Abnormal; Notable for the following components:  ? Prothrombin Time 15.8 (*)   ? INR 1.3 (*)   ? All other components within normal limits  ?CBC WITH DIFFERENTIAL/PLATELET  ? ? ? ?EKG ? ? ? ? ?RADIOLOGY ? ?I independently viewed and interpreted the images from the studies below: ? ?XR R wrist: Distal radius fracture ? ?CT head: No ICH or other acute abnormality ? ?CT cervical spine: No acute fracture ? ?XR R wrist postreduction: Slight improvement in alignment ? ?PROCEDURES: ? ?Critical Care performed: No ? ?.Ortho Injury Treatment ? ?Date/Time: 07/23/2021 7:04 PM ?Performed by: Arta Silence, MD ?Authorized by: Arta Silence, MD  ? ?Consent:  ?  Consent obtained:  Verbal ?  Consent given by:  Patient ?  Risks discussed:  Nerve damage, restricted joint movement, vascular damage and stiffnessInjury location: wrist ?Location details: right wrist ?Injury type: fracture ?Fracture type: distal radius ?Pre-procedure neurovascular assessment: neurovascularly intact ?Anesthesia: hematoma block ? ?Anesthesia: ?Local anesthesia used: yes ?Local Anesthetic: lidocaine 1% without epinephrine ?Anesthetic total: 8 mL ? ?Patient sedated: NoManipulation performed: yes ?Skin traction used: yes ?Skeletal traction used: yes ?Reduction successful: yes ?X-ray confirmed reduction: yes ?Immobilization: splint ?Splint type: sugar tong ?Splint Applied by: ED Provider ?Post-procedure neurovascular assessment: post-procedure neurovascularly intact ? ? ? ? ?MEDICATIONS ORDERED IN ED: ?Medications  ?lidocaine (PF) (XYLOCAINE) 1 % injection (  Given by Other 07/23/21 1712)  ? ? ? ?IMPRESSION / MDM / ASSESSMENT AND  PLAN / ED COURSE  ?I reviewed the triage vital signs and the nursing notes. ? ?66 year old female with PMH as noted above presents with right wrist and head injury after a mechanical fall while coming out of the shower.  She had no LOC. ? ?On exam the vital signs are normal.  Neurologic exam is nonfocal.  There is a deformity to the right wrist with the right upper extremity is neuro/vascular intact. ? ?Right wrist injury is consistent with likely distal radius fracture.  We will obtain x-rays for further evaluation. ? ?Although the patient denies any headache or vomiting and had no LOC, given the head injury on Eliquis and her age, we will obtain a CT.  There is no indication for CT of the cervical spine. ? ?We will obtain basic labs.  ? ?----------------------------------------- ?7:01 PM on 07/23/2021 ?----------------------------------------- ? ?Lab work-up is unremarkable.  CBC is normal.  BMP shows mildly elevated creatinine consistent with baseline. ? ?CT head and cervical spine are negative. ? ?X-ray reveals a distal radius fracture.  I consulted Dr. Posey Pronto from orthopedics who recommended closed reduction.  This was performed.  Postreduction x-rays show slight improvement.  I consulted with him again to review the postreduction x-rays and he agrees that the patient is appropriate for discharge with outpatient follow-up. ? ?I counseled the patient on the results of the work-up and plan of care.  Return precautions given, and she expresses understanding. ? ? ?FINAL CLINICAL IMPRESSION(S) / ED DIAGNOSES  ? ?Final diagnoses:  ?Closed fracture of distal end of right radius, unspecified fracture morphology, initial encounter  ?Minor head injury, initial encounter  ? ? ? ?Rx / DC Orders  ? ?ED Discharge Orders   ? ?      Ordered  ?  traMADol (ULTRAM) 50 MG tablet  Every 6 hours PRN       ? 07/23/21 1901  ? ?  ?  ? ?  ? ? ? ?Note:  This document was prepared using Dragon voice recognition software and may include  unintentional dictation errors.  ?  Arta Silence, MD ?07/23/21 1905 ? ?

## 2021-07-25 ENCOUNTER — Other Ambulatory Visit: Payer: Self-pay | Admitting: Physician Assistant

## 2021-07-25 DIAGNOSIS — E1129 Type 2 diabetes mellitus with other diabetic kidney complication: Secondary | ICD-10-CM

## 2021-07-29 DIAGNOSIS — M25531 Pain in right wrist: Secondary | ICD-10-CM | POA: Diagnosis not present

## 2021-07-29 DIAGNOSIS — E119 Type 2 diabetes mellitus without complications: Secondary | ICD-10-CM | POA: Diagnosis not present

## 2021-07-29 DIAGNOSIS — S52501A Unspecified fracture of the lower end of right radius, initial encounter for closed fracture: Secondary | ICD-10-CM | POA: Diagnosis not present

## 2021-07-30 ENCOUNTER — Ambulatory Visit: Payer: Medicare Other | Admitting: Nurse Practitioner

## 2021-07-30 ENCOUNTER — Other Ambulatory Visit: Payer: Self-pay | Admitting: Orthopedic Surgery

## 2021-07-31 ENCOUNTER — Encounter
Admission: RE | Admit: 2021-07-31 | Discharge: 2021-07-31 | Disposition: A | Discharge status: Home / Self Care | Payer: Medicare Other | Source: Ambulatory Visit | Attending: Orthopedic Surgery | Admitting: Orthopedic Surgery

## 2021-07-31 HISTORY — DX: Liver cell carcinoma: C22.0

## 2021-07-31 HISTORY — DX: Paroxysmal atrial fibrillation: I48.0

## 2021-07-31 HISTORY — DX: Alcoholic cirrhosis of liver without ascites: K70.30

## 2021-07-31 NOTE — Patient Instructions (Addendum)
Your procedure is scheduled on: Tuesday, April 18 ?Report to the Registration Desk on the 1st floor of the [facility]. ?To find out your arrival time, please call [phone] between 1PM - 3PM on: Monday, April 17 ? ?REMEMBER: ?Instructions that are not followed completely may result in serious medical risk, up to and including death; or upon the discretion of your surgeon and anesthesiologist your surgery may need to be rescheduled. ? ?Do not eat food after midnight the night before surgery.  ?No gum chewing, lozengers or hard candies. ? ?You may however, drink water up to 2 hours before you are scheduled to arrive for your surgery. Do not drink anything within 2 hours of your scheduled arrival time. ? ?In addition, your doctor has ordered for you to drink the provided  ?Gatorade G2 ?Drinking this carbohydrate drink up to two hours before surgery helps to reduce insulin resistance and improve patient outcomes. Please complete drinking 2 hours prior to scheduled arrival time. ? ?TAKE THESE MEDICATIONS THE MORNING OF SURGERY WITH A SIP OF WATER: ? ?Metoprolol ? ?Only take 1/2 (15 units) of Toujeo insulin the night before surgery. ? ?Follow recommendations from Cardiologist regarding stopping Eliquis. Usual time to stop apixaban (Eliquis) is 2 days before surgery. Resume AFTER surgery per surgeon instruction. ? ?One week prior to surgery: ?Stop Anti-inflammatories (NSAIDS) such as Advil, Aleve, Ibuprofen, Motrin, Naproxen, Naprosyn and Aspirin based products such as Excedrin, Goodys Powder, BC Powder. ?Stop ANY OVER THE COUNTER supplements until after surgery. ?You may however, continue to take Tylenol if needed for pain up until the day of surgery. ? ?No Alcohol for 24 hours before or after surgery. ? ?No Smoking including e-cigarettes for 24 hours prior to surgery.  ?No chewable tobacco products for at least 6 hours prior to surgery.  ?No nicotine patches on the day of surgery. ? ?Do not use any "recreational"  drugs for at least a week prior to your surgery.  ?Please be advised that the combination of cocaine and anesthesia may have negative outcomes, up to and including death. ?If you test positive for cocaine, your surgery will be cancelled. ? ?On the morning of surgery brush your teeth with toothpaste and water, you may rinse your mouth with mouthwash if you wish. ?Do not swallow any toothpaste or mouthwash. ? ?Use CHG wipes as directed on instruction sheet. ? ?Do not wear jewelry, make-up, hairpins, clips or nail polish. ? ?Do not wear lotions, powders, or perfumes.  ? ?Do not shave body from the neck down 48 hours prior to surgery just in case you cut yourself which could leave a site for infection.  ?Also, freshly shaved skin may become irritated if using the CHG soap. ? ?Contact lenses, hearing aids and dentures may not be worn into surgery. ? ?Do not bring valuables to the hospital. [facility] is not responsible for any missing/lost belongings or valuables.  ? ?Notify your doctor if there is any change in your medical condition (cold, fever, infection). ? ?Wear comfortable clothing (specific to your surgery type) to the hospital. ? ?After surgery, you can help prevent lung complications by doing breathing exercises.  ?Take deep breaths and cough every 1-2 hours. Your doctor may order a device called an Incentive Spirometer to help you take deep breaths. ? ?If you are being discharged the day of surgery, you will not be allowed to drive home. ?You will need a responsible adult (18 years or older) to drive you home and stay with  you that night.  ? ?If you are taking public transportation, you will need to have a responsible adult (18 years or older) with you. ?Please confirm with your physician that it is acceptable to use public transportation.  ? ?Please call the [facility]. at [phone] if you have any questions about these instructions. ? ?Surgery Visitation Policy: ? ?Patients undergoing  a surgery or procedure may have two family members or support persons with them as long as the person is not COVID-19 positive or experiencing its symptoms.  ?

## 2021-08-03 ENCOUNTER — Encounter
Admission: RE | Admit: 2021-08-03 | Discharge: 2021-08-03 | Disposition: A | Discharge status: Home / Self Care | Payer: Medicare Other | Source: Ambulatory Visit | Attending: Orthopedic Surgery | Admitting: Orthopedic Surgery

## 2021-08-03 DIAGNOSIS — I4891 Unspecified atrial fibrillation: Secondary | ICD-10-CM | POA: Diagnosis not present

## 2021-08-03 DIAGNOSIS — Z01810 Encounter for preprocedural cardiovascular examination: Secondary | ICD-10-CM | POA: Diagnosis not present

## 2021-08-03 DIAGNOSIS — I10 Essential (primary) hypertension: Secondary | ICD-10-CM | POA: Diagnosis not present

## 2021-08-03 NOTE — Pre-Procedure Instructions (Signed)
Adriana Bush, MD  ?Physician ?Cardiology ?Progress Notes     ?Signed ?Encounter Date:  04/30/2020 ?  ?Signed    ?  ?Expand All Collapse All ?   ?   ?   ?   ?   ?   ?   ?   ?   ?   ?   ?   ?   ?   ?   ?   ?   ?   ?   ?   ?   ?   ?   ?   ?   ?   ?   ?   ?   ?   ?   ?   ?   ?   ?   ?   ?   ?   ?   ?   ?   ?   ?   ?   ?   ?   ?   ?   ?   ?   ?   ?   ?   ?   ?   ?   ?   ?   ?   ?   ?   ?   ?   ?   ?   ?   ?   ?   ?   ?   ?   ?   ?   ?   ?   ?   ?   ?   ?   ?   ?   ?   ?   ?   ?   ?   ?   ?   ?   ?   ?   ?   ?   ?   ?   ?   ?   ?   ?   ?   ?  ?Follow-up Outpatient Visit ?Date: 04/30/2020 ?  ?Primary Care Provider: ?Ronnell Freshwater, NP ?[place] ?[place] [address] ?  ?Chief Complaint: Follow-up atrial fibrillation ?  ?HPI:  Adriana Miller is a 66 y.o. 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  She was last seen in our office in 10/2019, having been diagnosed with hepatocellular carcinoma earlier in the year.  She underwent transarterial radioembolization in 09/2019.  At her last visit, she was feeling relatively well and was back in sinus rhythm.  Diltiazem was decreased to 120 mg daily.  She had been on apixaban 5 mg twice daily, though it appears that the dose was switched to 2.5 mg twice daily this fall for unclear reasons. ?  ?Today, Adriana Miller reports that she has been doing well other than some elevated blood sugars.  She is back on long-acting insulin and notes that her blood sugars have improved.  She occasionally feels a little bit tremulous but otherwise is doing okay.  She has not had any chest pain, shortness of breath, palpitations, lightheadedness, edema, or bleeding. ?   ?-------------------------------------------------------------------------------------------------- ?  ?    ?Past Medical History:  ?Diagnosis Date  ? B12 deficiency 04/05/2017  ? Blood in stool    ? Cerebral aneurysm    ? Chronic kidney disease    ? Diabetes mellitus without complication ([place])    ?  Gastric erosion with bleeding    ? Hyperlipidemia    ? Hypertension    ? Lower extremity cellulitis 02/08/2019  ? Seizure disorder ([place])    ?  ?     ?Past Surgical History:  ?Procedure Laterality Date  ? CEREBRAL ANEURYSM REPAIR   1991  ? COLONOSCOPY WITH PROPOFOL N/A 02/12/2019  ?  Procedure: COLONOSCOPY WITH PROPOFOL;  Surgeon: Lin Landsman, MD;  Location[facility] ENDOSCOPY;  Service: Gastroenterology;  Laterality: N/A;  ? ESOPHAGOGASTRODUODENOSCOPY (EGD) WITH PROPOFOL N/A 02/12/2019  ?  Procedure: ESOPHAGOGASTRODUODENOSCOPY (EGD) WITH PROPOFOL;  Surgeon: Lin Landsman, MD;  Location: [facility] ENDOSCOPY;  Service: Gastroenterology;  Laterality: N/A;  ? IR ANGIOGRAM SELECTIVE EACH ADDITIONAL VESSEL   09/21/2019  ? IR ANGIOGRAM SELECTIVE EACH ADDITIONAL VESSEL   09/21/2019  ? IR ANGIOGRAM SELECTIVE EACH ADDITIONAL VESSEL   09/21/2019  ? IR ANGIOGRAM SELECTIVE EACH ADDITIONAL VESSEL   09/21/2019  ? IR ANGIOGRAM SELECTIVE EACH ADDITIONAL VESSEL   10/02/2019  ? IR ANGIOGRAM VISCERAL SELECTIVE   09/21/2019  ? IR ANGIOGRAM VISCERAL SELECTIVE   09/21/2019  ? IR ANGIOGRAM VISCERAL SELECTIVE   10/02/2019  ? IR EMBO TUMOR ORGAN ISCHEMIA INFARCT INC GUIDE ROADMAPPING   10/02/2019  ? IR EMBO TUMOR ORGAN ISCHEMIA INFARCT INC GUIDE ROADMAPPING   10/02/2019  ? IR RADIOLOGIST EVAL & MGMT   08/28/2019  ? IR RADIOLOGIST EVAL & MGMT   01/30/2020  ? IR RADIOLOGIST EVAL & MGMT   04/30/2020  ? IR US GUIDE VASC ACCESS LEFT   09/21/2019  ? IR US GUIDE VASC ACCESS LEFT   10/02/2019  ?  ?  ?Active Medications  ?    ?Current Meds  ?Medication Sig  ? allopurinol (ZYLOPRIM) 100 MG tablet TAKE 1 TABLET BY MOUTH EVERY DAY  ? ELIQUIS 2.5 MG TABS tablet TAKE 1  TABLET BY MOUTH TWICE A DAY  ? escitalopram (LEXAPRO) 10 MG tablet TAKE 1 TABLET (10 MG TOTAL) BY MOUTH DAILY. FOR ANXIETY/SLEEP  ? furosemide (LASIX) 20 MG tablet TAKE 1 TABLET BY MOUTH EVERY DAY  ? glucose blood (ACCU-CHEK GUIDE) test strip Use as instructed once a daily DX e11.65  ? insulin glargine, 1 Unit Dial, (TOUJEO SOLOSTAR) 300 UNIT/ML Solostar Pen Use 35 units sq in the evening with food  ? Insulin Pen Needle 32G X 4 MM MISC Use as directed  With toujeo  ? metoprolol tartrate (LOPRESSOR) 25 MG tablet TAKE 1/2 TABLET BY MOUTH TWICE A DAY  ? mirtazapine (REMERON) 7.5 MG tablet Take 7.5 mg by mouth at bedtime.  ? Safety Lancets 28G MISC Use as directed once a daily Dx e11.65  ? spironolactone (ALDACTONE) 25 MG tablet Take 1 tablet (25 mg total) by mouth daily. For cirrhosis of liver  ? traZODone (DESYREL) 50 MG tablet Take 0.5-1 tablets (25-50 mg total) by mouth at bedtime as needed for sleep.  ?  ?  ?  ?Allergies: Aspirin ?  ?Social History  ?  ?     ?Tobacco Use  ? Smoking status: Never Smoker  ? Smokeless tobacco: Current User  ?    Types: Snuff  ?Vaping Use  ? Vaping Use: Never used  ?Substance Use Topics  ? Alcohol use: Not Currently  ?    Alcohol/week: 14.0 standard drinks  ?    Types: 14 Cans of beer per week  ?    Comment: patient quit in october 2020  ? Drug use: No  ?  ?  ?     ?  Family History  ?Problem Relation Age of Onset  ? Heart attack Brother 12  ?  ?  ?Review of Systems: ?A 12-system review of systems was performed and was negative except as noted in the HPI. ?  ?-------------------------------------------------------------------------------------------------- ?  ?Physical Exam: ?BP 118/82   Pulse 71   Ht '5\' 6"'$  (1.676 m)   Wt 163 lb (73.9 kg)   BMI 26.31 kg/m?  ?  ?General:  NAD. ?Neck: No JVD or HJR. ?Lungs: Clear to auscultation bilaterally without wheezes or crackles. ?Heart: Regular rate and rhythm without murmurs, rubs, or gallops. ?Abdomen: Soft, nontender,  nondistended. ?Extremities: No lower extremity edema. ?  ?EKG: Normal sinus rhythm with borderline LVH.  Heart rate has increased from prior tracing on 10/29/2019.  Otherwise, there has been no significant interval change. ?  ?Recent Labs  ?     ?Lab Results  ?Component Value Date  ?  WBC 5.3 01/03/2020  ?  HGB 13.2 01/03/2020  ?  HCT 39.1 01/03/2020  ?  MCV 90.7 01/03/2020  ?  PLT 139 (L) 01/03/2020  ?  ?  ?  ?Recent Labs  ?     ?Lab Results  ?Component Value Date  ?  NA 141 01/03/2020  ?  K 4.3 01/03/2020  ?  CL 98 01/03/2020  ?  CO2 33 (H) 01/03/2020  ?  BUN 17 01/03/2020  ?  CREATININE 1.27 (H) 01/03/2020  ?  GLUCOSE 298 (H) 01/03/2020  ?  ALT 19 04/17/2020  ?  ?  ?  ?Recent Labs  ?     ?Lab Results  ?Component Value Date  ?  CHOL 134 01/27/2018  ?  HDL 80 01/27/2018  ?  [place] 42 01/27/2018  ?  TRIG 60 01/27/2018  ?  ?  ?  ?-------------------------------------------------------------------------------------------------- ?  ?ASSESSMENT AND PLAN: ?Persistent atrial fibrillation: ?Ms. Glace is in sinus rhythm today and does not have any symptoms to suggest recurrent atrial fibrillation.  Given a CHA2DS2-VASc score of at least 4, she has been maintained on anticoagulation following clearance by GI last year.  At some point this fall, it looks like her apixaban was switched to 2.5 mg daily for uncertain reasons.  I will check a CBC and BMP today.  She would likely qualify for going back on 5 mg twice daily based on age and weight alone, and certainly if her creatinine is less than 1.5.  We will reach out to her with further instructions after reviewing the results of this lab work ?  ?Cirrhosis and hepatocellular carcinoma: ?No evidence of significant volume overload or reports of bleeding.  Continue current regimen of furosemide and spironolactone.  I will check a BMP and CBC today.  Continue follow-up with oncology and GI. ?  ?Chronic HFpEF: ?Ms. Chronis appears euvolemic with NYHA class I-II symptoms.   Continue current diuretic regimen with BMP today. ?  ?Follow-up: Return to clinic in 6 months. ?  ?Adriana Bush, MD ?04/30/2020 ?1:40 PM ?   ?  ?  ?  ?Electronically signed by Adriana Bush, MD at 04/30/2020  2:04 PM ?Office Visit on 04/30/2020 ? ?Office Visit on 04/30/2020 ? ? ?Detailed Repo

## 2021-08-03 NOTE — Pre-Procedure Instructions (Signed)
Called over and spoke with Allyson at Dr Theodore Demark  office stating that pt is needing clearance due to ekg showing afib with rvr. Dr Andree Elk (anesthesia) said he is fine with either medical or cardiac clearance. Pt has not seen cardiologist since 04-2020 and last saw pcp on 05-2021 so I will send medical clearance since pt has seen them recently. Otherwise cardiology will probably require an in office visit since its been > 1 year since pt was seen. Allyson verbalized she will relay this info to Dr [employer] nurse ?

## 2021-08-04 ENCOUNTER — Telehealth: Payer: Self-pay | Admitting: Internal Medicine

## 2021-08-04 NOTE — Telephone Encounter (Signed)
? ?  Pre-operative Risk Assessment  ?  ?Patient Name: Adriana Miller  ?DOB: 15-Jul-1955 ?MRN: [id number]  ? ?  ? ?Request for Surgical Clearance   ? ?Procedure:   ORIF R Distal radius fracture  ? ?Date of Surgery:  Clearance TBD                              ?   ?Surgeon:  Dr Joni Reining ?Surgeon's Group or Practice Name:  [facility] ?Phone number:  [phone] ?Fax number:  [phone] ?  ?Type of Clearance Requested:   ?- Medical  ?  ?Type of Anesthesia:  Not Indicated ?  ?Additional requests/questions:   ? ?Signed, ?Caryl Pina Gerringer   ?08/04/2021, 8:58 AM  ? ?

## 2021-08-04 NOTE — Telephone Encounter (Signed)
Anesthesia listed as CHOICE  ?

## 2021-08-04 NOTE — Telephone Encounter (Signed)
Will route to pharm so that anticoag recs are available to Dr. Saunders Revel at time of OV to include in his note. ?

## 2021-08-04 NOTE — Telephone Encounter (Signed)
I s/w Whitney at orthopedic office. I informed that we are unable to provide clearance until the pt has been seen, as we have not seen the pt in over 1 yr. Pt has appt 08/06/21. We understand the pt needs to have surgery as she broke her wrist. Whitney, tells me that the surgery scheduler did already let the pt know surgery is postponed until we can provide clearance. I will send notes to Dr. Saunders Revel for upcoming appt. Will send FYI to requesting office the pt has appt 08/06/21 @ 8:20 with DR. End.  ?

## 2021-08-04 NOTE — Telephone Encounter (Signed)
? ?  Patient Name: Adriana Miller  ?DOB: 12-24-1955 ?MRN: [id number] ? ?Primary Cardiologist: Nelva Bush, MD ? ?Chart reviewed as part of pre-operative protocol coverage, received to inbox 08/04/21. Please contact requesting surgeon's office whether clearance is still needed since surgery is already posted for today. The patient has not been seen by our office in over 1 year therefore we cannot provide clearance recommendation without evaluating the patient in clinic. She does have a follow-up schedule 08/06/21 with Dr. Saunders Revel which we can keep for clearance if needed, would just need to involve pharm preliminarily for anticoagulation recs. Please let preop pool know what they say. ? ?Charlie Pitter, PA-C ?08/04/2021, 10:16 AM ? ? ?

## 2021-08-05 NOTE — Telephone Encounter (Signed)
Patient with diagnosis of atrial fibrillation on Eliquis for anticoagulation.   ? ?Procedure: ORIF R distal radius fracture ?Date of procedure: TBD ? ? ?CHA2DS2-VASc Score = 5  ? This indicates a 7.2% annual risk of stroke. ?The patient's score is based upon: ?CHF History: 1 ?HTN History: 1 ?Diabetes History: 1 ?Stroke History: 0 ?Vascular Disease History: 0 ?Age Score: 1 ?Gender Score: 1 ?  ?CrCl 45 ?Platelet count 169 ? ?Per office protocol, patient can hold Eliquis for 2 days prior to procedure.   ?Patient Adriana Miller not need bridging with Lovenox (enoxaparin) around procedure. ? ? ?

## 2021-08-06 ENCOUNTER — Other Ambulatory Visit: Payer: Self-pay

## 2021-08-06 ENCOUNTER — Ambulatory Visit: Payer: Medicare Other | Admitting: Anesthesiology

## 2021-08-06 ENCOUNTER — Encounter
Admission: RE | Disposition: A | Discharge status: Home / Self Care | Payer: Self-pay | Source: Home / Self Care | Attending: Orthopedic Surgery

## 2021-08-06 ENCOUNTER — Encounter: Payer: Self-pay | Admitting: Orthopedic Surgery

## 2021-08-06 ENCOUNTER — Encounter: Payer: Self-pay | Admitting: Internal Medicine

## 2021-08-06 ENCOUNTER — Ambulatory Visit: Payer: Medicare Other

## 2021-08-06 ENCOUNTER — Ambulatory Visit
Admission: RE | Admit: 2021-08-06 | Discharge: 2021-08-06 | Disposition: A | Discharge status: Home / Self Care | Payer: Medicare Other | Attending: Orthopedic Surgery | Admitting: Orthopedic Surgery

## 2021-08-06 ENCOUNTER — Ambulatory Visit (INDEPENDENT_AMBULATORY_CARE_PROVIDER_SITE_OTHER): Payer: Medicare Other | Admitting: Internal Medicine

## 2021-08-06 VITALS — BP 120/70 | HR 75 | Ht 66.0 in | Wt 189.0 lb

## 2021-08-06 DIAGNOSIS — I4819 Other persistent atrial fibrillation: Secondary | ICD-10-CM | POA: Diagnosis not present

## 2021-08-06 DIAGNOSIS — R251 Tremor, unspecified: Secondary | ICD-10-CM | POA: Diagnosis not present

## 2021-08-06 DIAGNOSIS — Z79899 Other long term (current) drug therapy: Secondary | ICD-10-CM | POA: Insufficient documentation

## 2021-08-06 DIAGNOSIS — Y93E1 Activity, personal bathing and showering: Secondary | ICD-10-CM | POA: Insufficient documentation

## 2021-08-06 DIAGNOSIS — Z7901 Long term (current) use of anticoagulants: Secondary | ICD-10-CM | POA: Diagnosis not present

## 2021-08-06 DIAGNOSIS — K219 Gastro-esophageal reflux disease without esophagitis: Secondary | ICD-10-CM | POA: Insufficient documentation

## 2021-08-06 DIAGNOSIS — Z7984 Long term (current) use of oral hypoglycemic drugs: Secondary | ICD-10-CM | POA: Insufficient documentation

## 2021-08-06 DIAGNOSIS — E785 Hyperlipidemia, unspecified: Secondary | ICD-10-CM | POA: Diagnosis not present

## 2021-08-06 DIAGNOSIS — K7030 Alcoholic cirrhosis of liver without ascites: Secondary | ICD-10-CM | POA: Insufficient documentation

## 2021-08-06 DIAGNOSIS — E782 Mixed hyperlipidemia: Secondary | ICD-10-CM | POA: Insufficient documentation

## 2021-08-06 DIAGNOSIS — E119 Type 2 diabetes mellitus without complications: Secondary | ICD-10-CM | POA: Insufficient documentation

## 2021-08-06 DIAGNOSIS — Z794 Long term (current) use of insulin: Secondary | ICD-10-CM | POA: Insufficient documentation

## 2021-08-06 DIAGNOSIS — I5032 Chronic diastolic (congestive) heart failure: Secondary | ICD-10-CM | POA: Diagnosis not present

## 2021-08-06 DIAGNOSIS — I130 Hypertensive heart and chronic kidney disease with heart failure and stage 1 through stage 4 chronic kidney disease, or unspecified chronic kidney disease: Secondary | ICD-10-CM | POA: Diagnosis not present

## 2021-08-06 DIAGNOSIS — M25531 Pain in right wrist: Secondary | ICD-10-CM | POA: Diagnosis not present

## 2021-08-06 DIAGNOSIS — I480 Paroxysmal atrial fibrillation: Secondary | ICD-10-CM

## 2021-08-06 DIAGNOSIS — S52501A Unspecified fracture of the lower end of right radius, initial encounter for closed fracture: Secondary | ICD-10-CM | POA: Diagnosis not present

## 2021-08-06 DIAGNOSIS — M79621 Pain in right upper arm: Secondary | ICD-10-CM | POA: Diagnosis not present

## 2021-08-06 DIAGNOSIS — W182XXA Fall in (into) shower or empty bathtub, initial encounter: Secondary | ICD-10-CM | POA: Diagnosis not present

## 2021-08-06 DIAGNOSIS — G8918 Other acute postprocedural pain: Secondary | ICD-10-CM | POA: Diagnosis not present

## 2021-08-06 DIAGNOSIS — Z01810 Encounter for preprocedural cardiovascular examination: Secondary | ICD-10-CM | POA: Diagnosis not present

## 2021-08-06 HISTORY — PX: OPEN REDUCTION INTERNAL FIXATION (ORIF) DISTAL RADIAL FRACTURE: SHX5989

## 2021-08-06 LAB — GLUCOSE, CAPILLARY
Glucose-Capillary: 79 mg/dL (ref 70–99)
Glucose-Capillary: 53 mg/dL — ABNORMAL LOW (ref 70–99)
Glucose-Capillary: 80 mg/dL (ref 70–99)
Glucose-Capillary: 113 mg/dL — ABNORMAL HIGH (ref 70–99)

## 2021-08-06 IMAGING — DX DG WRIST 2V*R*
2 series · 2 of 2 positions shown · non-contrast
Comparison: Right wrist radiograph [DATE]

CLINICAL DATA: Postop right wrist.

EXAM:
RIGHT WRIST - 2 VIEW

[wrist ap]
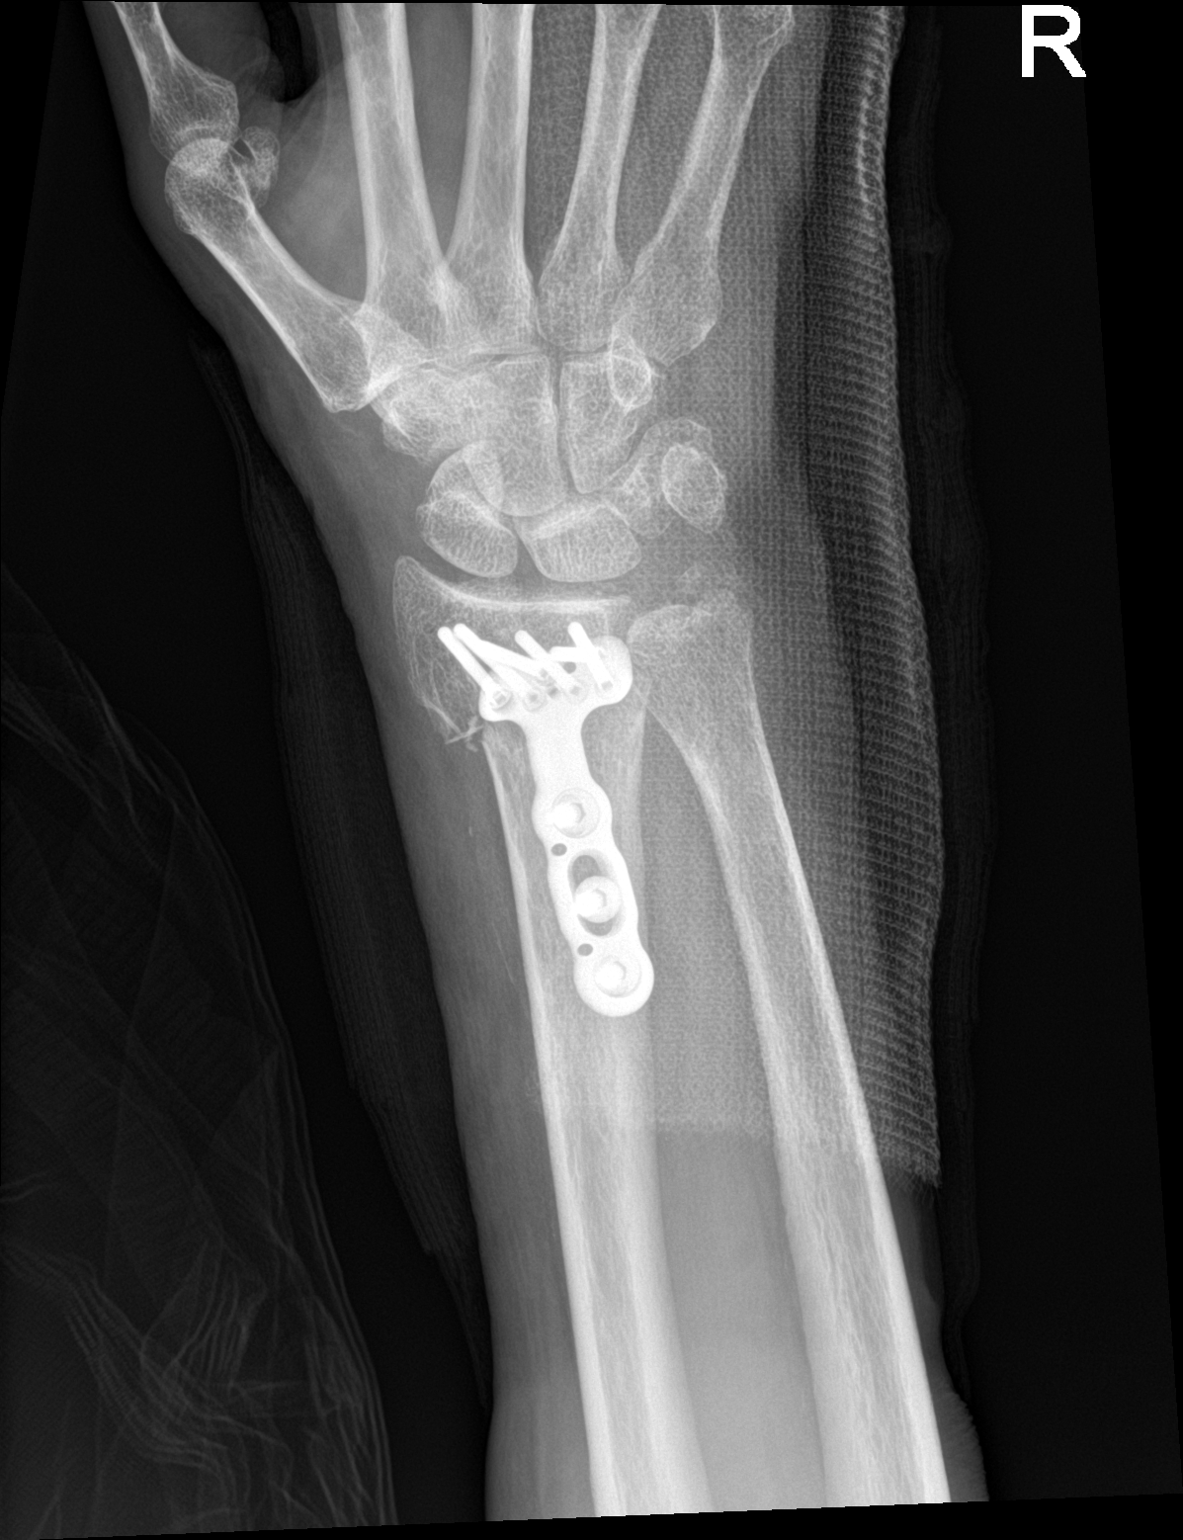

[wrist lat]
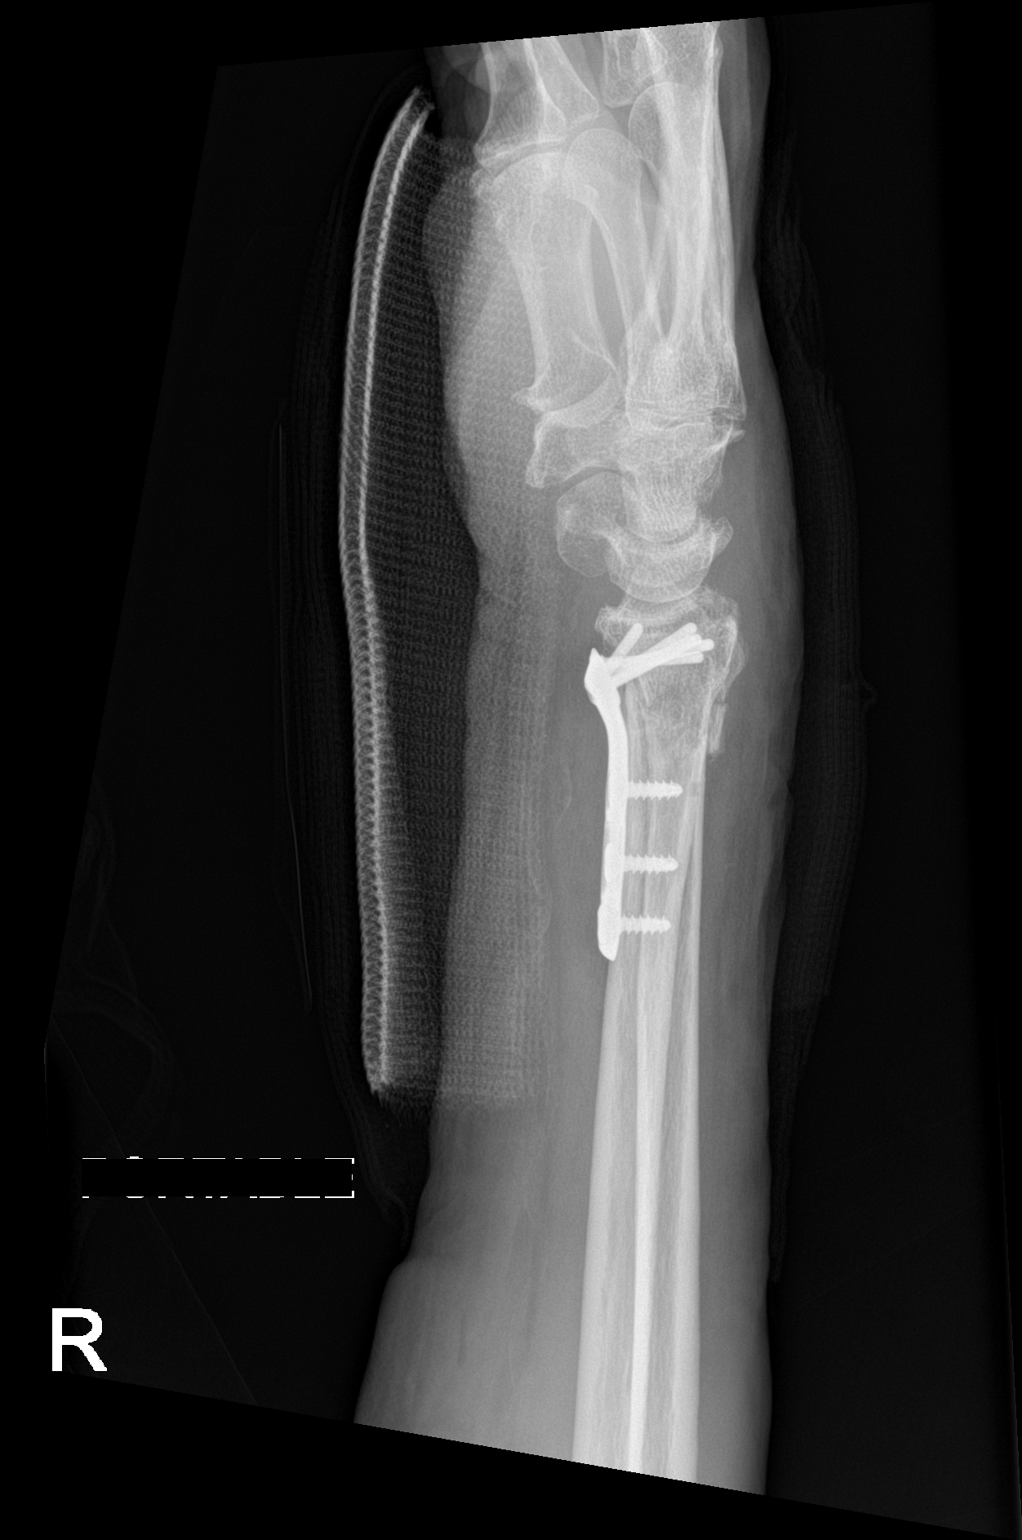

[2 of 2 positions shown; findings below may reference images not displayed]

FINDINGS: Interval reduction and plate and screw fixation of the distal radius
fracture. Hardware is intact and in appropriate position. Unchanged
mildly displaced ulnar styloid fracture.
IMPRESSION: Expected postoperative changes status post ORIF of the right distal
radius fracture. Unchanged mildly displaced ulnar styloid fracture.

## 2021-08-06 SURGERY — OPEN REDUCTION INTERNAL FIXATION (ORIF) DISTAL RADIUS FRACTURE
Anesthesia: General | Site: Wrist | Laterality: Right

## 2021-08-06 MED ORDER — CEFAZOLIN SODIUM-DEXTROSE 2-4 GM/100ML-% IV SOLN: 2.0000 g | INTRAVENOUS | Status: DC

## 2021-08-06 MED ORDER — FENTANYL CITRATE PF 50 MCG/ML IJ SOSY
PREFILLED_SYRINGE | INTRAMUSCULAR | Status: AC
  Administered 2021-08-06: 50 ug via INTRAVENOUS
  Filled 2021-08-06: qty 1

## 2021-08-06 MED ORDER — FENTANYL CITRATE (PF) 100 MCG/2ML IJ SOLN
25.0000 ug | INTRAMUSCULAR | Status: DC | PRN
  Administered 2021-08-06 (×3): 25 ug via INTRAVENOUS

## 2021-08-06 MED ORDER — CHLORHEXIDINE GLUCONATE 0.12 % MT SOLN: 15.0000 mL | Freq: Once | OROMUCOSAL | Status: AC

## 2021-08-06 MED ORDER — FENTANYL CITRATE PF 50 MCG/ML IJ SOSY: 50.0000 ug | PREFILLED_SYRINGE | Freq: Once | INTRAMUSCULAR | Status: AC

## 2021-08-06 MED ORDER — FUROSEMIDE 20 MG PO TABS
20.0000 mg | ORAL_TABLET | Freq: Every day | ORAL | 0 refills | Status: DC
Start: 2021-08-06 — End: 2022-02-08

## 2021-08-06 MED ORDER — DEXTROSE 50 % IV SOLN: 25.0000 mL | Freq: Once | INTRAVENOUS | Status: AC

## 2021-08-06 MED ORDER — FENTANYL CITRATE (PF) 100 MCG/2ML IJ SOLN
INTRAMUSCULAR | Status: AC
  Administered 2021-08-06: 25 ug via INTRAVENOUS
  Filled 2021-08-06: qty 2

## 2021-08-06 MED ORDER — DEXTROSE 50 % IV SOLN
12.5000 g | Freq: Once | INTRAVENOUS | Status: AC
  Administered 2021-08-06: 12.5 g via INTRAVENOUS

## 2021-08-06 MED ORDER — SODIUM CHLORIDE 0.9 % IV SOLN: INTRAVENOUS | Status: DC

## 2021-08-06 MED ORDER — PROPOFOL 1000 MG/100ML IV EMUL
INTRAVENOUS | Status: AC
  Filled 2021-08-06: qty 100

## 2021-08-06 MED ORDER — BUPIVACAINE HCL (PF) 0.5 % IJ SOLN
INTRAMUSCULAR | Status: AC
  Filled 2021-08-06: qty 20

## 2021-08-06 MED ORDER — OXYCODONE HCL 5 MG/5ML PO SOLN: 5.0000 mg | Freq: Once | ORAL | Status: AC | PRN

## 2021-08-06 MED ORDER — PROPOFOL 500 MG/50ML IV EMUL
INTRAVENOUS | Status: DC | PRN
  Administered 2021-08-06: 200 ug/kg/min via INTRAVENOUS

## 2021-08-06 MED ORDER — APIXABAN 5 MG PO TABS: 5.0000 mg | ORAL_TABLET | Freq: Two times a day (BID) | ORAL | 0 refills | Status: DC

## 2021-08-06 MED ORDER — PROPOFOL 10 MG/ML IV BOLUS
INTRAVENOUS | Status: AC
  Filled 2021-08-06: qty 20

## 2021-08-06 MED ORDER — BUPIVACAINE HCL (PF) 0.5 % IJ SOLN
INTRAMUSCULAR | Status: DC | PRN
  Administered 2021-08-06: 7 mL via PERINEURAL
  Administered 2021-08-06: 13 mL via PERINEURAL

## 2021-08-06 MED ORDER — FENTANYL CITRATE (PF) 100 MCG/2ML IJ SOLN
INTRAMUSCULAR | Status: AC
  Filled 2021-08-06: qty 2

## 2021-08-06 MED ORDER — OXYCODONE HCL 5 MG PO TABS: 5.0000 mg | ORAL_TABLET | Freq: Once | ORAL | Status: AC | PRN

## 2021-08-06 MED ORDER — 0.9 % SODIUM CHLORIDE (POUR BTL) OPTIME
TOPICAL | Status: DC | PRN
  Administered 2021-08-06: 500 mL

## 2021-08-06 MED ORDER — CEFAZOLIN SODIUM-DEXTROSE 1-4 GM/50ML-% IV SOLN
INTRAVENOUS | Status: AC
  Filled 2021-08-06: qty 50

## 2021-08-06 MED ORDER — DEXTROSE 50 % IV SOLN
INTRAVENOUS | Status: AC
  Administered 2021-08-06: 25 mL via INTRAVENOUS
  Filled 2021-08-06: qty 50

## 2021-08-06 MED ORDER — CHLORHEXIDINE GLUCONATE 0.12 % MT SOLN
OROMUCOSAL | Status: AC
  Administered 2021-08-06: 15 mL via OROMUCOSAL
  Filled 2021-08-06: qty 15

## 2021-08-06 MED ORDER — OXYCODONE HCL 5 MG PO TABS
ORAL_TABLET | ORAL | Status: AC
  Administered 2021-08-06: 5 mg via ORAL
  Filled 2021-08-06: qty 1

## 2021-08-06 MED ORDER — HYDROCODONE-ACETAMINOPHEN 5-325 MG PO TABS: 1.0000 | ORAL_TABLET | Freq: Four times a day (QID) | ORAL | 0 refills | Status: DC | PRN

## 2021-08-06 MED ORDER — ORAL CARE MOUTH RINSE: 15.0000 mL | Freq: Once | OROMUCOSAL | Status: AC

## 2021-08-06 SURGICAL SUPPLY — 42 items
APL PRP STRL LF DISP 70% ISPRP (MISCELLANEOUS) ×1
BIT DRILL 2 FAST STEP (BIT) ×1 IMPLANT
BIT DRILL 2.5X4 QC (BIT) ×1 IMPLANT
BNDG ELASTIC 4X5.8 VLCR STR LF (GAUZE/BANDAGES/DRESSINGS) ×2 IMPLANT
CHLORAPREP W/TINT 26 (MISCELLANEOUS) ×2 IMPLANT
CUFF TOURN SGL QUICK 18X4 (TOURNIQUET CUFF) IMPLANT
DRAPE FLUOR MINI C-ARM 54X84 (DRAPES) ×2 IMPLANT
ELECT REM PT RETURN 9FT ADLT (ELECTROSURGICAL) ×2
ELECTRODE REM PT RTRN 9FT ADLT (ELECTROSURGICAL) ×1 IMPLANT
GAUZE SPONGE 4X4 12PLY STRL (GAUZE/BANDAGES/DRESSINGS) ×2 IMPLANT
GAUZE XEROFORM 1X8 LF (GAUZE/BANDAGES/DRESSINGS) ×4 IMPLANT
GLOVE SURG SYN 9.0  PF PI (GLOVE) ×1
GLOVE SURG SYN 9.0 PF PI (GLOVE) ×1 IMPLANT
GOWN SRG 2XL LVL 4 RGLN SLV (GOWNS) ×1 IMPLANT
GOWN STRL NON-REIN 2XL LVL4 (GOWNS) ×2
GOWN STRL REUS W/ TWL LRG LVL3 (GOWN DISPOSABLE) ×1 IMPLANT
GOWN STRL REUS W/TWL LRG LVL3 (GOWN DISPOSABLE) ×2
K-WIRE 1.6 (WIRE) ×4
K-WIRE FX5X1.6XNS BN SS (WIRE) ×2
KIT TURNOVER KIT A (KITS) ×2 IMPLANT
KWIRE FX5X1.6XNS BN SS (WIRE) IMPLANT
MANIFOLD NEPTUNE II (INSTRUMENTS) ×2 IMPLANT
NDL FILTER BLUNT 18X1 1/2 (NEEDLE) ×1 IMPLANT
NEEDLE FILTER BLUNT 18X 1/2SAF (NEEDLE) ×1
NEEDLE FILTER BLUNT 18X1 1/2 (NEEDLE) ×1 IMPLANT
NS IRRIG 500ML POUR BTL (IV SOLUTION) ×2 IMPLANT
PACK EXTREMITY ARMC (MISCELLANEOUS) ×2 IMPLANT
PAD CAST CTTN 4X4 STRL (SOFTGOODS) ×2 IMPLANT
PADDING CAST COTTON 4X4 STRL (SOFTGOODS) ×4
PEG SUBCHONDRAL SMOOTH 2.0X16 (Peg) ×1 IMPLANT
PEG SUBCHONDRAL SMOOTH 2.0X20 (Peg) ×3 IMPLANT
PEG SUBCHONDRAL SMOOTH 2.0X22 (Peg) ×2 IMPLANT
PLATE SHORT 21.6X48.9 NRRW RT (Plate) ×1 IMPLANT
SCALPEL PROTECTED #15 DISP (BLADE) ×4 IMPLANT
SCREW CORT 3.5X10 LNG (Screw) ×3 IMPLANT
SPLINT CAST 1 STEP 3X12 (MISCELLANEOUS) ×2 IMPLANT
SUT ETHILON 4-0 (SUTURE) ×2
SUT ETHILON 4-0 FS2 18XMFL BLK (SUTURE) ×1
SUT VICRYL 3-0 27IN (SUTURE) ×2 IMPLANT
SUTURE ETHLN 4-0 FS2 18XMF BLK (SUTURE) ×1 IMPLANT
SYR 3ML LL SCALE MARK (SYRINGE) ×2 IMPLANT
WATER STERILE IRR 500ML POUR (IV SOLUTION) ×2 IMPLANT

## 2021-08-06 NOTE — Anesthesia Preprocedure Evaluation (Addendum)
Anesthesia Evaluation  ?Patient identified by MRN, date of birth, ID band ?Patient awake ? ? ? ?Reviewed: ?Allergy & Precautions, NPO status , Patient's Chart, lab work & pertinent test results ? ?History of Anesthesia Complications ?Negative for: history of anesthetic complications ? ?Airway ?Mallampati: III ? ?TM Distance: <3 FB ?Neck ROM: full ? ? ? Dental ? ?(+) Chipped, Poor Dentition, Missing ?  ?Pulmonary ?neg pulmonary ROS, neg shortness of breath,  ?  ?Pulmonary exam normal ? ? ? ? ? ? ? Cardiovascular ?Exercise Tolerance: Good ?hypertension, (-) angina(-) Past MI and (-) DOE + dysrhythmias Atrial Fibrillation  ?Rhythm:irregular Rate:Normal ? ? ?  ?Neuro/Psych ?Seizures -,  negative psych ROS  ? GI/Hepatic ?Neg liver ROS, PUD, GERD  Controlled,  ?Endo/Other  ?diabetes, Type 2 ? Renal/GU ?Renal disease  ?negative genitourinary ?  ?Musculoskeletal ? ? Abdominal ?  ?Peds ? Hematology ?negative hematology ROS ?(+)   ?Anesthesia Other Findings ?Patient has cardiac clearance for this procedure.  ? ?Past Medical History: ?No date: Alcoholic cirrhosis of liver without ascites ([place]) ?04/05/2017: B12 deficiency ?No date: Blood in stool ?No date: Cerebral aneurysm ?No date: Chronic kidney disease ?No date: Diabetes mellitus without complication ([place]) ?    Comment:  type 2 ?No date: Gastric erosion with bleeding ?No date: Hepatocellular carcinoma ([place]) ?No date: Hyperlipidemia ?No date: Hypertension ?02/08/2019: Lower extremity cellulitis ?No date: Paroxysmal A-fib ([place]) ?No date: Seizure disorder [facility]) ? ?Past Surgical History: ?1991: CEREBRAL ANEURYSM REPAIR ?02/12/2019: COLONOSCOPY WITH PROPOFOL; N/A ?    Comment:  Procedure: COLONOSCOPY WITH PROPOFOL;  Surgeon: Marius Ditch,  ?             Tally Due, MD;  Location: [facility] ENDOSCOPY;  Service:  ?             Gastroenterology;  Laterality: N/A; ?02/12/2019: ESOPHAGOGASTRODUODENOSCOPY (EGD) WITH PROPOFOL; N/A ?    Comment:  Procedure:  ESOPHAGOGASTRODUODENOSCOPY (EGD) WITH  ?             PROPOFOL;  Surgeon: Lin Landsman, MD;  Location:  ?             [facility] ENDOSCOPY;  Service: Gastroenterology;  Laterality:  ?             N/A; ?09/21/2019: IR ANGIOGRAM SELECTIVE EACH ADDITIONAL VESSEL ?09/21/2019: IR ANGIOGRAM SELECTIVE EACH ADDITIONAL VESSEL ?09/21/2019: IR ANGIOGRAM SELECTIVE EACH ADDITIONAL VESSEL ?09/21/2019: IR ANGIOGRAM SELECTIVE EACH ADDITIONAL VESSEL ?10/02/2019: IR ANGIOGRAM SELECTIVE EACH ADDITIONAL VESSEL ?09/21/2019: IR ANGIOGRAM VISCERAL SELECTIVE ?09/21/2019: IR ANGIOGRAM VISCERAL SELECTIVE ?10/02/2019: IR ANGIOGRAM VISCERAL SELECTIVE ?10/02/2019: IR EMBO TUMOR ORGAN ISCHEMIA INFARCT INC GUIDE ROADMAPPING ?10/02/2019: IR EMBO TUMOR ORGAN ISCHEMIA INFARCT INC GUIDE ROADMAPPING ?08/28/2019: IR RADIOLOGIST EVAL & MGMT ?01/30/2020: IR RADIOLOGIST EVAL & MGMT ?04/30/2020: IR RADIOLOGIST EVAL & MGMT ?07/30/2020: IR RADIOLOGIST EVAL & MGMT ?10/28/2020: IR RADIOLOGIST EVAL & MGMT ?02/24/2021: IR RADIOLOGIST EVAL & MGMT ?06/08/2021: IR RADIOLOGIST EVAL & MGMT ?09/21/2019: IR US GUIDE VASC ACCESS LEFT ?10/02/2019: IR US GUIDE VASC ACCESS LEFT ? ?BMI   ? Body Mass Index: 30.49 kg/m?  ?  ? ? Reproductive/Obstetrics ?negative OB ROS ? ?  ? ? ? ? ? ? ? ? ? ? ? ? ? ?  ?  ? ? ? ? ? ? ? ?Anesthesia Physical ?Anesthesia Plan ? ?ASA: 3 ? ?Anesthesia Plan: General  ? ?Post-op Pain Management: Regional block*  ? ?Induction: Intravenous ? ?PONV Risk Score and Plan: Propofol infusion and TIVA ? ?Airway Management Planned: Natural Airway and Nasal  Cannula ? ?Additional Equipment:  ? ?Intra-op Plan:  ? ?Post-operative Plan:  ? ?Informed Consent: I have reviewed the patients History and Physical, chart, labs and discussed the procedure including the risks, benefits and alternatives for the proposed anesthesia with the patient or authorized representative who has indicated his/her understanding and acceptance.  ? ? ? ?Dental Advisory Given ? ?Plan Discussed with:  Anesthesiologist, CRNA and Surgeon ? ?Anesthesia Plan Comments: (Patient consented for risks of anesthesia including but not limited to:  ?- adverse reactions to medications ?- risk of airway placement if required ?- damage to eyes, teeth, lips or other oral mucosa ?- nerve damage due to positioning  ?- sore throat or hoarseness ?- Damage to heart, brain, nerves, lungs, other parts of body or loss of life ? ?Patient voiced understanding.)  ? ? ? ? ? ? ?Anesthesia Quick Evaluation ? ?

## 2021-08-06 NOTE — Anesthesia Procedure Notes (Signed)
Anesthesia Regional Block: Supraclavicular block  ? ?Pre-Anesthetic Checklist: , timeout performed,  Correct Patient, Correct Site, Correct Laterality,  Correct Procedure, Correct Position, site marked,  Risks and benefits discussed,  Surgical consent,  Pre-op evaluation,  At surgeon's request and post-op pain management ? ?Laterality: Upper and Right ? ?Prep: chloraprep     ?  ?Needles:  ?Injection technique: Single-shot ? ?Needle Type: Stimiplex   ? ? ?Needle Length: 9cm  ?Needle Gauge: 22  ? ? ? ?Additional Needles: ? ? ?Procedures:,,,, ultrasound used (permanent image in chart),,    ?Narrative:  ?Start time: 08/06/2021 10:47 AM ?End time: 08/06/2021 10:51 AM ?Injection made incrementally with aspirations every 5 mL. ? ?Performed by: Personally  ?Anesthesiologist: Wane Mollett, Precious Haws, MD ? ?Additional Notes: ?Patient consented for risk and benefits of nerve block including but not limited to nerve damage, failed block, bleeding and infection.  Patient voiced understanding. ? ?Functioning IV was confirmed and monitors were applied.  Timeout done prior to procedure and prior to any sedation being given to the patient.  Patient confirmed procedure site prior to any sedation given to the patient.  A 71m 22ga Stimuplex needle was used. Sterile prep,hand hygiene and sterile gloves were used.  Minimal sedation used for procedure.  No paresthesia endorsed by patient during the procedure.  Negative aspiration and negative test dose prior to incremental administration of local anesthetic. The patient tolerated the procedure well with no immediate complications. ? ? ? ?

## 2021-08-06 NOTE — H&P (Signed)
?Chief Complaint  ?Patient presents with  ? Right Wrist - Pain, Follow-up  ? ?Reason for Visit ?The patient is a 66 year old female that presented for complaints of her right wrist. The patient states that she fell when she got out of her shower after the water heater leaked water. She landed on her right wrist and hit her head. She went to the emergency room on July 23, 2021 where x-rays showed a displaced fracture. They did a closed reduction of her wrist with slight improvement and put her into a sugar-tong splint. She is still having a lot of pain and swelling of the hand. They did give her some hydrocodone for breakthrough pain. She denies any history of injury to that wrist. She is quite active and is right-hand dominant. She lives alone at home. She lives in an apartment. The patient is a diabetic. She is on Eliquis. ? ?Medications ?Current Outpatient Medications  ?Medication Sig Dispense Refill  ? ACCU-CHEK AVIVA PLUS TEST STRP test strip USE TO CHECK BS TWICE DAILY. DX 250.02 5  ? ACCU-CHEK SOFTCLIX LANCETS lancets USE TO CHECK BLOOD SUGAR TWICE DAILY. 5  ? allopurinoL (ZYLOPRIM) 100 MG tablet  ? amLODIPine (NORVASC) 5 MG tablet Take 5 mg by mouth once daily. 3  ? ELIQUIS 2.5 mg tablet Take 2.5 mg by mouth 2 (two) times daily  ? escitalopram oxalate (LEXAPRO) 10 MG tablet TAKE 1 TABLET (10 MG TOTAL) BY MOUTH DAILY. FOR ANXIETY/SLEEP  ? FUROsemide (LASIX) 20 MG tablet Take 20 mg by mouth once daily  ? glimepiride (AMARYL) 1 MG tablet Take 1 tablet by mouth daily with breakfast  ? lisinopril (PRINIVIL,ZESTRIL) 20 MG tablet TAKE 1 TABLET EVERY DAY FOR BLOOD PRESSURE 3  ? metFORMIN (GLUCOPHAGE) 500 MG tablet TAKE 1 TWICE DAILY 3  ? metoprolol tartrate (LOPRESSOR) 25 MG tablet TAKE 0.5 TABLETS BY MOUTH 2 TIMES DAILY.  ? mirtazapine (REMERON) 7.5 MG tablet Take 7.5 mg by mouth at bedtime  ? TOUJEO SOLOSTAR U-300 INSULIN pen injector (concentration 300 units/mL) INJECT 30 UNITS SUBCUTANEOUSLY IN THE EVENING WITH  FOOD  ? traMADoL (ULTRAM) 50 mg tablet TAKE 1 TABLET BY MOUTH EVERY 6 HOURS AS NEEDED FOR UP TO 5 DAYS.  ? traZODone (DESYREL) 50 MG tablet TAKE 1-2 TABS BY MOUTH AT NIGHT BEFORE BED FOR INSOMNIA  ? HYDROcodone-acetaminophen (NORCO) 5-325 mg tablet Take 1 tablet by mouth every 6 (six) hours as needed for Pain for up to 30 doses 30 tablet 0  ? ?No current facility-administered medications for this visit.  ? ?Allergies ?Aspirin ? ?Histories ?Past Medical History: ?Past Medical History:  ?Diagnosis Date  ? Acute upper respiratory infection 09/07/2017  ? Alcohol abuse with other alcohol-induced disorder (CMS-HCC) 04/05/2017  ? Alcoholic cirrhosis of liver without ascites (CMS-HCC)  ? Aneurysm (CMS-HCC) 05/20/2015  ? Atrial fibrillation with RVR (CMS-HCC) 07/25/2019  ? B12 deficiency 05/20/2015  ? Chronic heart failure with preserved ejection fraction (HFpEF) (CMS-HCC) 01/15/2019  ? Diabetes mellitus type 2, uncomplicated (CMS-HCC)  ? Diastolic dysfunction 69/45/0388  ? Dyspnea on exertion 10/10/2018  ? Hepatic cirrhosis (CMS-HCC) 04/30/2020  ? Hepatocellular carcinoma (CMS-HCC) 01/08/2020  ?Cancer Staging: Stage II (cT2, cN0, cM0) - Signed by Earlie Server, MD on 01/08/2020  ? Hypertension  ? Hypotension 04/20/2019  ? Left arm weakness 07/25/2019  ? Mixed hyperlipidemia 04/05/2017  ? Persistent atrial fibrillation (CMS-HCC) 10/10/2018  ? QT prolongation 01/15/2019  ? Seizures (CMS-HCC)  ? ?Past Surgical History: ?Past Surgical History:  ?Procedure Laterality Date  ?  COLONOSCOPY WITH PROPOFOL 02/12/2019  ?Procedure: COLONOSCOPY WITH PROPOFOL; Surgeon: Lin Landsman, MD; Location: [facility] ENDOSCOPY; Service: Gastroenterology; Laterality  ? ESOPHAGOGASTRODUODENOSCOPY (EGD) WITH PROPOFOL 02/12/2019  ?Procedure: ESOPHAGOGASTRODUODENOSCOPY (EGD) WITH PROPOFOL; Surgeon: Lin Landsman, MD; Location: [facility] ENDOSCOPY; Service: Gastroenterology;  ? IR RADIOLOGIST EVAL & MGMT 08/28/2019  ? IR US GUIDE VASC ACCESS LEFT 09/21/2019   ? IR ANGIOGRAM VISCERAL SELECTIVE 09/21/2019  ? IR ANGIOGRAM VISCERAL SELECTIVE 09/21/2019  ? IR ANGIOGRAM SELECTIVE EACH ADDITIONAL VESSEL 09/21/2019  ? IR ANGIOGRAM SELECTIVE EACH ADDITIONAL VESSEL 09/21/2019  ? IR ANGIOGRAM SELECTIVE EACH ADDITIONAL VESSEL 09/21/2019  ? IR ANGIOGRAM SELECTIVE EACH ADDITIONAL VESSEL 09/21/2019  ? IR EMBO TUMOR ORGAN ISCHEMIA INFARCT INC GUIDE ROADMAPPING 10/02/2019  ? IR ANGIOGRAM VISCERAL SELECTIVE 10/02/2019  ? IR EMBO TUMOR ORGAN ISCHEMIA INFARCT INC GUIDE ROADMAPPING 10/02/2019  ? IR US GUIDE VASC ACCESS LEFT 10/02/2019  ? IR ANGIOGRAM SELECTIVE EACH ADDITIONAL VESSEL 10/02/2019  ? IR RADIOLOGIST EVAL & MGMT 01/30/2020  ? IR RADIOLOGIST EVAL & MGMT 04/30/2020  ? IR RADIOLOGIST EVAL & MGMT 07/30/2020  ? IR RADIOLOGIST EVAL & MGMT 10/28/2020  ? IR RADIOLOGIST EVAL & MGMT 02/24/2021  ? IR RADIOLOGIST EVAL & MGMT 06/08/2021  ? CEREBRAL ANEURYSM REPAIR  ?04/19/1989 - 04/18/1990  ? ?Social History: ?Social History  ? ?Socioeconomic History  ? Marital status: Unknown  ?Tobacco Use  ? Smoking status: Never  ? Smokeless tobacco: Current  ?Types: Snuff  ?Substance and Sexual Activity  ? Alcohol use: Not Currently  ? Drug use: No  ? ?Family History: ?Family History  ?Problem Relation Age of Onset  ? Myocardial Infarction (Heart attack) Brother  ? ?Review of Systems ?A comprehensive 14 point ROS was performed, reviewed, and the pertinent orthopaedic findings are documented in the HPI. ? ?Exam ?BP 125/69  Pulse 90  Ht 165.1 cm ('5\' 5"'$ )  Wt 83.9 kg (185 lb)  BMI 30.79 kg/m?  ? ?General/Constitutional: NAD, conversant ?Eyes: Pupils equal and round, extraocular movements intact ?ENT: atraumatic external nose and ears, moist mucous membranes ?Respiratory: non-labored breathing, symmetric chest rise, chest sounds clear. ?Cardiovascular: no visible lower extremity edema, peripheral pulses present, regular rate and rhythm  ?Skin: normal skin turgor, warm and dry ?Neurological: cranial nerves  grossly intact, sensation grossly intact ?Psychological: Appropriate mood and affect; appropriate judgment ?Musculoskeletal: as detailed below: ? ?General: ?Well developed, well nourished 66 y.o. female in no apparent distress. Normal affect. Normal communication. Patient answers questions appropriately. The patient has a slow shuffling gait with a cane. There is no antalgic component. There is no hip lurch.  ? ?Heart: ?Examination of the heart reveals regular, rate, and rhythm. There is no murmur noted on ascultation. There is a normal apical pulse. ? ?Lungs: ?Lungs are clear to auscultation. There is no wheeze, rhonchi, or crackles. There is normal expansion of bilateral chest walls.  ? ?Right upper extremity: ?Examination of the right wrist and arm reveals the sugar-tong splint in good position. There is minimal edema into the fingers. The patient had no range of motion of the wrist attempted including no range of motion of the elbow. She was comfortable with very minimal grip of the wrist. She had limited motion of her fingers with neurovascular still intact. She had good skin warmth. ? ?Radiology: ?Xrays of the right wrist were ordered and interpreted 07/30/2021, with 3 views using AP, lateral, oblique views. Xrays revealed the right wrist with a displaced distal radius fracture with impaction below the level of the ulna and  slight dorsal angulation with comminution. There is no intra-articular involvement. ? ?Impression ?Encounter Diagnoses  ?Name Primary?  ? Closed traumatic displaced fracture of distal end of right radius, initial encounter Yes  ? Acute pain of right wrist  ? Type 2 diabetes mellitus without complication, unspecified whether long term insulin use (CMS-HCC)  ? ?Plan  ?1. I discussed the physical exam finding as well as x-ray results with the patient and Dr. Rudene Christians. I discussed the situation with her wrist injury. ?2. The patient was continued in her sugar-tong splint for better support and  stability. This was not adjusted today. ?3. She was given hydrocodone for breakthrough pain. ?4. The patient will be set up for a right distal radius fracture ORIF with volar plating to be done by Dr. Rudene Christians on [place]

## 2021-08-06 NOTE — Anesthesia Postprocedure Evaluation (Signed)
Anesthesia Post Note ? ?Patient: Adriana Miller ? ?Procedure(s) Performed: OPEN REDUCTION INTERNAL FIXATION (ORIF) DISTAL RADIAL FRACTURE (Right: Wrist) ? ?Patient location during evaluation: PACU ?Anesthesia Type: General ?Level of consciousness: awake and alert ?Pain management: pain level controlled ?Vital Signs Assessment: post-procedure vital signs reviewed and stable ?Respiratory status: spontaneous breathing, nonlabored ventilation, respiratory function stable and patient connected to nasal cannula oxygen ?Cardiovascular status: blood pressure returned to baseline and stable ?Postop Assessment: no apparent nausea or vomiting ?Anesthetic complications: no ? ? ?No notable events documented. ? ? ?Last Vitals:  ?Vitals:  ? 08/06/21 1535 08/06/21 1540  ?BP: 125/72 135/69  ?Pulse: 65 67  ?Resp: (!) 22 12  ?Temp:    ?SpO2: 98% 95%  ?  ?Last Pain:  ?Vitals:  ? 08/06/21 1505  ?TempSrc:   ?PainSc: 0-No pain  ? ? ?  ?  ?  ?  ?  ?  ? ?Adriana Miller ? ? ? ? ?

## 2021-08-06 NOTE — Anesthesia Procedure Notes (Signed)
Date/Time: 08/06/2021 2:13 PM ?Performed by: Nelda Marseille, CRNA ?Pre-anesthesia Checklist: Patient identified, Emergency Drugs available, Suction available, Patient being monitored and Timeout performed ?Oxygen Delivery Method: Simple face mask ? ? ? ? ?

## 2021-08-06 NOTE — Discharge Instructions (Addendum)
Try to move your fingers is much as you can ?Keep arm elevated today and tomorrow and through the weekend is much as you can ?Ice to the back of the wrist today and tomorrow no heat to the hand ?Pain medicine as directed ?Call office if you are having problems ? ? ? ? ?AMBULATORY SURGERY  ?DISCHARGE INSTRUCTIONS ? ? ?The drugs that you were given will stay in your system until tomorrow so for the next 24 hours you should not: ? ?Drive an automobile ?Make any legal decisions ?Drink any alcoholic beverage ? ? ?You may resume regular meals tomorrow.  Today it is better to start with liquids and gradually work up to solid foods. ? ?You may eat anything you prefer, but it is better to start with liquids, then soup and crackers, and gradually work up to solid foods. ? ? ?Please notify your doctor immediately if you have any unusual bleeding, trouble breathing, redness and pain at the surgery site, drainage, fever, or pain not relieved by medication. ? ?  ? ?Your post-operative visit with Dr.                     ? ? ?           ?     is: Date:                        Time:   ? ?Please call to schedule your post-operative visit. ? ?Additional Instructions:  ?

## 2021-08-06 NOTE — Transfer of Care (Addendum)
Immediate Anesthesia Transfer of Care Note ? ?Patient: Adriana Miller ? ?Procedure(s) Performed: OPEN REDUCTION INTERNAL FIXATION (ORIF) DISTAL RADIAL FRACTURE (Right: Wrist) ? ?Patient Location: PACU ? ?Anesthesia Type:General ? ?Level of Consciousness: awake, alert  and oriented ? ?Airway & Oxygen Therapy: Patient Spontanous Breathing and Patient connected to face mask oxygen ? ?Post-op Assessment: Report given to RN and Post -op Vital signs reviewed and stable ? ?Post vital signs: Reviewed and stable ? ?Last Vitals:  ?Vitals Value Taken Time  ?BP 138/71 08/06/21 1545  ?Temp 36.2 ?C 08/06/21 1505  ?Pulse 72 08/06/21 1549  ?Resp 14 08/06/21 1549  ?SpO2 87 % 08/06/21 1549  ?Vitals shown include unvalidated device data. ? ?Last Pain:  ?Vitals:  ? 08/06/21 1505  ?TempSrc:   ?PainSc: 0-No pain  ?   ? ?  ? ?Complications: No notable events documented. ?

## 2021-08-06 NOTE — Progress Notes (Signed)
? ?Follow-up Outpatient Visit ?Date: 08/06/2021 ? ?Primary Care Provider: ?Mylinda Latina, PA-C ?[phone] ?[place] [address] ? ?Chief Complaint: Atrial fibrillation and preop evaluation ? ?HPI:  Adriana Miller is a 66 y.o. female with history of paroxysmal atrial fibrillation and flutter, hypertension, hyperlipidemia, type 2 diabetes mellitus, chronic kidney disease, cirrhosis complicated by GI bleed secondary to erosive gastropathy (01/2019) and hepatocellular carcinoma, seizure disorder, and cerebral aneurysm status post repair, who presents for follow-up of atrial fibrillation and urgent preoperative cardiovascular risk assessment.  I last saw Adriana Miller in 04/2020, at which time she was doing fairly well.  It was noted that she was on subtherapeutic apixaban at that time for unclear reasons.  She has remained on apixaban 2.5 mg twice daily since then, having no showed for her follow-up visit with [place] in 12/2020.  She had a mechanical fall earlier this month with fracture of the right distal radius.  She is scheduled for ORIF later this morning (though Adriana Miller states that she is unaware of this appointment for surgery). ? ?Ms. Knoche has been feeling well from a heart standpoint.  She denies chest pain, shortness of breath, palpitations, lightheadedness, and edema.  She and her niece, who accompanies her today, have noticed episodic shaking episodes when Adriana Miller stands.  This occurred last night and resolved after about a minute when Adriana Miller sat back down.  She did not lose consciousness or have other associated symptoms.  She continues to have inconsistent compliance with medications, in particular missing her evening dose of apixaban fairly often.  She has not had any bleeding.  She is minimally active; her most strenuous is activity is walking from her bed to the bathroom.  She continues to be bothered by her right radial fracture, which is still in a  splint. ? ?-------------------------------------------------------------------------------------------------- ? ?Past Medical History:  ?Diagnosis Date  ? Alcoholic cirrhosis of liver without ascites ([place])   ? B12 deficiency 04/05/2017  ? Blood in stool   ? Cerebral aneurysm   ? Chronic kidney disease   ? Diabetes mellitus without complication ([place])   ? type 2  ? Gastric erosion with bleeding   ? Hepatocellular carcinoma ([place])   ? Hyperlipidemia   ? Hypertension   ? Lower extremity cellulitis 02/08/2019  ? Paroxysmal A-fib ([place])   ? Seizure disorder ([place])   ? ?Past Surgical History:  ?Procedure Laterality Date  ? CEREBRAL ANEURYSM REPAIR  1991  ? COLONOSCOPY WITH PROPOFOL N/A 02/12/2019  ? Procedure: COLONOSCOPY WITH PROPOFOL;  Surgeon: Lin Landsman, MD;  Location: [facility] ENDOSCOPY;  Service: Gastroenterology;  Laterality: N/A;  ? ESOPHAGOGASTRODUODENOSCOPY (EGD) WITH PROPOFOL N/A 02/12/2019  ? Procedure: ESOPHAGOGASTRODUODENOSCOPY (EGD) WITH PROPOFOL;  Surgeon: Lin Landsman, MD;  Location: [facility] ENDOSCOPY;  Service: Gastroenterology;  Laterality: N/A;  ? IR ANGIOGRAM SELECTIVE EACH ADDITIONAL VESSEL  09/21/2019  ? IR ANGIOGRAM SELECTIVE EACH ADDITIONAL VESSEL  09/21/2019  ? IR ANGIOGRAM SELECTIVE EACH ADDITIONAL VESSEL  09/21/2019  ? IR ANGIOGRAM SELECTIVE EACH ADDITIONAL VESSEL  09/21/2019  ? IR ANGIOGRAM SELECTIVE EACH ADDITIONAL VESSEL  10/02/2019  ? IR ANGIOGRAM VISCERAL SELECTIVE  09/21/2019  ? IR ANGIOGRAM VISCERAL SELECTIVE  09/21/2019  ? IR ANGIOGRAM VISCERAL SELECTIVE  10/02/2019  ? IR EMBO TUMOR ORGAN ISCHEMIA INFARCT INC GUIDE ROADMAPPING  10/02/2019  ? IR EMBO TUMOR ORGAN ISCHEMIA INFARCT INC GUIDE ROADMAPPING  10/02/2019  ? IR RADIOLOGIST EVAL & MGMT  08/28/2019  ? IR RADIOLOGIST EVAL & MGMT  01/30/2020  ?  IR RADIOLOGIST EVAL & MGMT  04/30/2020  ? IR RADIOLOGIST EVAL & MGMT  07/30/2020  ? IR RADIOLOGIST EVAL & MGMT  10/28/2020  ? IR RADIOLOGIST EVAL & MGMT  02/24/2021  ? IR RADIOLOGIST EVAL & MGMT  06/08/2021  ?  IR US GUIDE VASC ACCESS LEFT  09/21/2019  ? IR US GUIDE VASC ACCESS LEFT  10/02/2019  ? ? ?Current Meds  ?Medication Sig  ? ACCU-CHEK GUIDE test strip USE AS INSTRUCTED ONCE A DAILY  ? allopurinol (ZYLOPRIM) 100 MG tablet TAKE 1 TABLET BY MOUTH EVERY DAY  ? apixaban (ELIQUIS) 2.5 MG TABS tablet Take 1 tablet (2.5 mg total) by mouth 2 (two) times daily.  ? BD PEN NEEDLE NANO 2ND GEN 32G X 4 MM MISC USE AS DIRECTED WITH TOUJEO  ? escitalopram (LEXAPRO) 10 MG tablet TAKE 1 TABLET (10 MG TOTAL) BY MOUTH DAILY. FOR ANXIETY/SLEEP  ? furosemide (LASIX) 20 MG tablet Take 1 tablet (20 mg total) by mouth daily. PLEAS CALL OFFICE TO SCHEDULE APPOINTMENT FOR FURTHER REFILLS. THANK YOU!  ? glimepiride (AMARYL) 1 MG tablet TAKE 1 TABLET BY MOUTH EVERY DAY WITH BREAKFAST  ? insulin glargine, 1 Unit Dial, (TOUJEO SOLOSTAR) 300 UNIT/ML Solostar Pen INJECT 30 UNITS SUBCUTANEOUSLY IN THE EVENING WITH FOOD  ? metoprolol tartrate (LOPRESSOR) 25 MG tablet TAKE 0.5 TABLETS BY MOUTH 2 TIMES DAILY.  ? mirtazapine (REMERON) 7.5 MG tablet Take 7.5 mg by mouth at bedtime.  ? Safety Lancets 28G MISC Use as directed once a daily Dx e11.65  ? spironolactone (ALDACTONE) 25 MG tablet TAKE 1 TABLET (25 MG TOTAL) BY MOUTH DAILY. FOR CIRRHOSIS OF LIVER  ? traMADol (ULTRAM) 50 MG tablet Take 50 mg by mouth every 6 (six) hours as needed.  ? traZODone (DESYREL) 50 MG tablet TAKE 1-2 TABS BY MOUTH AT NIGHT BEFORE BED FOR INSOMNIA  ? ? ?Allergies: Aspirin ? ?Social History  ? ?Tobacco Use  ? Smoking status: Never  ? Smokeless tobacco: Current  ?  Types: Snuff  ?Vaping Use  ? Vaping Use: Never used  ?Substance Use Topics  ? Alcohol use: Yes  ?  Alcohol/week: 14.0 standard drinks  ?  Types: 14 Cans of beer per week  ?  Comment: twice a week (beer)  ? Drug use: No  ? ? ?Family History  ?Problem Relation Age of Onset  ? Heart attack Brother 2  ? ? ?Review of Systems: ?A 12-system review of systems was performed and was negative except as noted in the  HPI. ? ?-------------------------------------------------------------------------------------------------- ? ?Physical Exam: ?BP 120/70 (BP Location: Left Arm, Patient Position: Sitting, Cuff Size: Normal)   Pulse 75   Ht '5\' 6"'$  (1.676 m)   Wt 189 lb (85.7 kg)   SpO2 96%   BMI 30.51 kg/m?  ? ?General:  NAD. ?Neck: No JVD or HJR. ?Lungs: Clear to auscultation bilaterally without wheezes or crackles. ?Heart: Regular rate and rhythm without murmurs, rubs, or gallops. ?Abdomen: Soft, nontender, nondistended. ?Extremities: No lower extremity edema.  Right arm is in a splint. ? ?EKG: Normal sinus rhythm without abnormality.  Compared with prior tracing from 08/03/2021, atrial fibrillation and nonspecific ST changes are no longer present. ? ?Lab Results  ?Component Value Date  ? WBC 5.2 07/23/2021  ? HGB 13.3 07/23/2021  ? HCT 41.0 07/23/2021  ? MCV 97.6 07/23/2021  ? PLT 169 07/23/2021  ? ? ?Lab Results  ?Component Value Date  ? NA 135 07/23/2021  ? K 3.4 (L) 07/23/2021  ?  CL 101 07/23/2021  ? CO2 23 07/23/2021  ? BUN 12 07/23/2021  ? CREATININE 1.34 (H) 07/23/2021  ? GLUCOSE 159 (H) 07/23/2021  ? ALT 45 (H) 09/30/2020  ? ? ?Lab Results  ?Component Value Date  ? CHOL 134 01/27/2018  ? HDL 80 01/27/2018  ? [place] 42 01/27/2018  ? TRIG 60 01/27/2018  ? ? ?-------------------------------------------------------------------------------------------------- ? ?ASSESSMENT AND PLAN: ?Paroxysmal atrial fibrillation: ?Ms. Abella is back in sinus rhythm today and is essentially asymptomatic when she is in a-fib.  We will continue current dose of metoprolol.  As she does not meet criteria for reduced dose apixaban, we will increase this to 5 mg twice daily.  Given her difficulty taking the evening dose, we discussed switching to rivaroxaban as an alternative, given that it is once daily dosing.  However, safety in the setting of underlying liver disease is less favorable compared to apixaban.  We will therefore continue with  apixaban at the 5 mg twice daily dose. ? ?Chronic HFpEF: ?Ms. Stofko appears euvolemic today.  Continue current doses of furosemide and spironolactone. ? ?Shaking: ?I am unclear what this represents.  Recommend further evaluation by her PCP if this remains

## 2021-08-06 NOTE — Patient Instructions (Signed)
Medication Instructions:  ? ?Your physician has recommended you make the following change in your medication:  ? ?WHEN you do restart your Eliquis, INCREASE to 5 mg TWICE daily - A new Rx has been sent to your pharmcy ? ?Refills have been sent for your Furosemide (Lasix) ? ?*If you need a refill on your cardiac medications before your next appointment, please call your pharmacy* ? ? ?Lab Work: ? ?None ordered ? ?Testing/Procedures: ? ?None ordered ? ? ?Follow-Up: ?At [facility], you and your health needs are our priority.  As part of our continuing mission to provide you with exceptional heart care, we have created designated Provider Care Teams.  These Care Teams include your primary Cardiologist (physician) and Advanced Practice Providers (APPs -  Physician Assistants and Nurse Practitioners) who all work together to provide you with the care you need, when you need it. ? ?We recommend signing up for the patient portal called "MyChart".  Sign up information is provided on this After Visit Summary.  MyChart is used to connect with patients for Virtual Visits (Telemedicine).  Patients are able to view lab/test results, encounter notes, upcoming appointments, etc.  Non-urgent messages can be sent to your provider as well.   ?To learn more about what you can do with MyChart, go to [email or web address].   ? ?Your next appointment:   ?3 month(s) ? ?The format for your next appointment:   ?In Person ? ?Provider:   ?You may see Nelva Bush, MD or one of the following Advanced Practice Providers on your designated Care Team:   ?Murray Hodgkins, NP ?Christell Faith, PA-C ?Cadence Kathlen Mody, PA-C ? ?Important Information About Sugar ? ? ? ? ? ? ?

## 2021-08-06 NOTE — Op Note (Signed)
08/06/2021 ? ?3:00 PM ? ?PATIENT:  Adriana Miller  66 y.o. female ? ?PRE-OPERATIVE DIAGNOSIS:  Closed traumatic displace fracture of distal end of right radius, initial encounter  S52.501A ?Acute pain of right wrist  M25.531 ? ?POST-OPERATIVE DIAGNOSIS:  Closed traumatic displace fracture of distal end of right radius, initial encounter  S52.501A ?Acute pain of right wrist  M25.531 ? ?PROCEDURE:  Procedure(s): ?OPEN REDUCTION INTERNAL FIXATION (ORIF) DISTAL RADIAL FRACTURE (Right) ? ?SURGEON: Laurene Footman, MD ? ?ASSISTANTS: None ? ?ANESTHESIA:   paracervical block ? ?EBL:  Total I/O ?In: 500 [I.V.:500] ?Out: -  ? ?BLOOD ADMINISTERED:none ? ?DRAINS: none  ? ?LOCAL MEDICATIONS USED:  NONE ? ?SPECIMEN:  No Specimen ? ?DISPOSITION OF SPECIMEN:  N/A ? ?COUNTS:  YES ? ?TOURNIQUET:   ?Total Tourniquet Time Documented: ?Upper Arm (Right) - 24 minutes ?Total: Upper Arm (Right) - 24 minutes ? ? ?IMPLANTS: Hand innovations short narrow DVR plate with multiple smooth pegs and screws ? ?DICTATION: .Dragon Dictation  patient was brought to the operating room and after adequate anesthesia was obtained the right arm was prepped and draped in the usual sterile fashion.  A tourniquet was applied the upper arm.  After patient identification and timeout procedures tourniquet was raised fingertrap traction was applied off the end of the table with 7 1/2 pounds of traction applied.  Incision was centered over the FCR tendon with the tendon down through the skin and subcutaneous tissue with hemostasis achieved electrocautery.  Tendon sheath was incised the tendon retracted radially followed by incision of the deep fascia retraction then allowed for exposure of the distal metaphysis elevation of the "pronator off the radial side of the shaft it is been stripped off the distal fragment.  With traction having been applied length was restored and with use of a Soil scientist interdigitation could be brought into alignment with short narrow  plate applied to the appropriate position and pinned distally all the smooth pegs were sent into the distal shaft metaphysis and making certain to avoid the joint space.  After all 6 holes were filled using standard technique the plate was brought down to the shaft and 3 10 mm screws applied.  Traction was removed and essentially anatomic alignment of been obtained there is no motion of the distal fragments with wrist hand or wrist motion.  The wound was irrigated and tourniquet let was set down the wound was then closed with 3-0 Vicryl subcutaneously 4-0 nylon for the skin with 10 cc of half percent Sensorcaine plain injected for postop analgesia.  Xeroform 4 x 4's web roll volar splint and Ace wrap then applied ? ?PLAN OF CARE: Discharge to home after PACU ? ?PATIENT DISPOSITION:  PACU - hemodynamically stable. ?  ? ?

## 2021-08-07 ENCOUNTER — Encounter: Payer: Self-pay | Admitting: Orthopedic Surgery

## 2021-08-10 DIAGNOSIS — Z9889 Other specified postprocedural states: Secondary | ICD-10-CM | POA: Diagnosis not present

## 2021-08-10 DIAGNOSIS — Z8781 Personal history of (healed) traumatic fracture: Secondary | ICD-10-CM | POA: Diagnosis not present

## 2021-08-18 DIAGNOSIS — Z8781 Personal history of (healed) traumatic fracture: Secondary | ICD-10-CM | POA: Diagnosis not present

## 2021-08-18 DIAGNOSIS — Z9889 Other specified postprocedural states: Secondary | ICD-10-CM | POA: Diagnosis not present

## 2021-08-21 ENCOUNTER — Other Ambulatory Visit: Payer: Self-pay | Admitting: Physician Assistant

## 2021-08-21 ENCOUNTER — Other Ambulatory Visit: Payer: Self-pay | Admitting: Internal Medicine

## 2021-08-21 DIAGNOSIS — G2581 Restless legs syndrome: Secondary | ICD-10-CM

## 2021-08-21 DIAGNOSIS — M10222 Drug-induced gout, left elbow: Secondary | ICD-10-CM

## 2021-08-21 DIAGNOSIS — I480 Paroxysmal atrial fibrillation: Secondary | ICD-10-CM

## 2021-08-21 NOTE — Telephone Encounter (Signed)
Prescription refill request for Eliquis received. ?Indication:Afib  ?Last office visit: 08/06/21 (End) ?Scr: 1.34 (07/23/21) ?Age: 66 ?Weight: 85.7kg ? ?Appropriate dose and refill sent to requested pharmacy.  ?

## 2021-08-24 ENCOUNTER — Other Ambulatory Visit: Payer: Self-pay | Admitting: Interventional Radiology

## 2021-08-24 DIAGNOSIS — C220 Liver cell carcinoma: Secondary | ICD-10-CM

## 2021-08-25 ENCOUNTER — Other Ambulatory Visit: Payer: Self-pay | Admitting: Interventional Radiology

## 2021-08-25 DIAGNOSIS — C220 Liver cell carcinoma: Secondary | ICD-10-CM

## 2021-08-28 ENCOUNTER — Telehealth: Payer: Self-pay

## 2021-08-28 NOTE — Telephone Encounter (Signed)
1210 pm.  Phone call made to patient to schedule a home visit with patient.  Patient is agreeable for next Friday at 1 pm.  ?

## 2021-09-01 ENCOUNTER — Other Ambulatory Visit: Payer: Self-pay | Admitting: Physician Assistant

## 2021-09-01 DIAGNOSIS — G4700 Insomnia, unspecified: Secondary | ICD-10-CM

## 2021-09-04 ENCOUNTER — Other Ambulatory Visit: Payer: Medicare Other | Admitting: Primary Care

## 2021-09-04 ENCOUNTER — Telehealth: Payer: Self-pay | Admitting: Primary Care

## 2021-09-04 NOTE — Telephone Encounter (Signed)
T/c to let her know RN  had a schedule conflict and cannot come today, and will reschedule. Pt reported a fall last pm but says she's doing ok.

## 2021-09-07 ENCOUNTER — Other Ambulatory Visit: Payer: Medicare Other | Admitting: Primary Care

## 2021-09-07 VITALS — BP 130/82 | HR 67 | Temp 97.2°F | Resp 18

## 2021-09-07 DIAGNOSIS — E1165 Type 2 diabetes mellitus with hyperglycemia: Secondary | ICD-10-CM | POA: Diagnosis not present

## 2021-09-07 DIAGNOSIS — R531 Weakness: Secondary | ICD-10-CM

## 2021-09-07 DIAGNOSIS — R296 Repeated falls: Secondary | ICD-10-CM | POA: Diagnosis not present

## 2021-09-07 DIAGNOSIS — K7460 Unspecified cirrhosis of liver: Secondary | ICD-10-CM

## 2021-09-07 NOTE — Progress Notes (Signed)
PC SW placed new MOW referral to [employer].

## 2021-09-07 NOTE — Progress Notes (Signed)
Therapist, nutritional Palliative Care Consult Note Telephone: [phone]  Fax: [phone]    Date of encounter: 09/07/21 11:43 AM PATIENT NAME: Adriana Miller [address] [place] [address]   [phone] (home)  DOB: 1955-08-07 MRN: [id number] PRIMARY CARE PROVIDER:    Alan Ripper,  [address] [place] [address] [phone]  REFERRING PROVIDER:   Alan Ripper [address] [place],  [place] [address] [phone]  RESPONSIBLE PARTY:    Contact Information     Name Relation Home Work Mobile   Torain,James Brother [phone]  [phone]      I connected with  NAYRA COURY on 09/07/21 by a video enabled telemedicine application and verified that I am speaking with the correct person using two identifiers.   I discussed the limitations of evaluation and management by telemedicine. The patient expressed understanding and agreed to proceed.   I met face to face with patient and family in the home connecting virtually with Willette Pa, RN.  Palliative Care was asked to follow this patient by consultation request of  McDonough, Salomon Fick, PA* to address advance care planning and complex medical decision making. This is a follow up visit.                                   ASSESSMENT AND PLAN / RECOMMENDATIONS:   Advance Care Planning/Goals of Care: Goals include to maximize quality of life and symptom management. Patient/health care surrogate gave his/her permission to discuss. Our advance care planning conversation included a discussion about:     Exploration of personal, cultural or spiritual beliefs that might influence medical decisions  DNR on file in Epic but not in home, per RN visit. Will complete forms on next visit to home.  CODE STATUS: DNR  Symptom Management/Plan:  Diabetes:  Patient has been unable to check her blood sugars.   RN Attempted to trouble-shoot issue with lancet pen but unable  to resolve.    I asked her about her glucose management. She states that using the Lancet pen was onerous, and she's not able to do this. I've suggested some single -use  Finger lancets. They appear fairly affordable at about $.10 per use. She would not need to check multiple times daily or even daily perhaps just 2 to 3 times a week to monitor. Her A-1 C is gone from 7.8 to 5.3 which might be too tightly controlled. It's possible she is having  some hypoglycemia or other reason for the falling and I would like to have this checked periodically at home. She endorses otherwise feeling well not having pain, liver functions are staying stable, and she does get support daily through personal care services for ADLs.    Falls, fracture: I met per video with patient and nurse, Willette Pa. We discussed her recent falls. She did have a fall at the end of last week which she called EMS to help. She states her fracture in her arm from the fall a month ago is healing, but is still somewhat sore. She stated she had some rib pain after her fall,  Last week. We discussed that she probably needed to slow down and use her cane and walker more. She states she was simply getting up and missed the door handle last week.  Resources: Patient was getting hot MOW until the pandemic began.  At  that time, meals were changed to frozen meals for a week.  Patient is unable to cook without supervison so she stopped MOW services.  She is interested in re-starting this if hot meals are being served.  Notified [place], SW who will follow up.    Follow up Palliative Care Visit: Palliative care will continue to follow for complex medical decision making, advance care planning, and clarification of goals. Return 8 weeks or prn.  This visit was coded based on medical decision making (MDM).  PPS: 50%  HOSPICE ELIGIBILITY/DIAGNOSIS: TBD  Chief Complaint: frequent falls   HISTORY OF PRESENT ILLNESS:  Adriana Miller is a 66 y.o.  year old female  with liver disease, debility, DM. Patient seen today to review palliative care needs to include medical decision making and advance care planning as appropriate.   History obtained from review of EMR, discussion with primary team, and interview with family, facility staff/caregiver and/or Ms. Vorndran.  I reviewed available labs, medications, imaging, studies and related documents from the EMR.  Records reviewed and summarized above.   ROS  General: NAD EYES: denies vision changes ENMT: denies dysphagia Cardiovascular: denies chest pain, + DOE Pulmonary: + cough, denies increased SOB Abdomen: endorses good appetite, denies constipation, endorses continence of bowel GU: denies dysuria, endorses continence of urine MSK:  denies increased weakness,  + falls reported-last Thursday EMS activated and checked on patient.  Skin: denies rashes or wounds Neurological: denies pain, + insomnia Psych: Endorses positive mood Heme/lymph/immuno: denies bruises, abnormal bleeding  Physical Exam: Current and past weights:  Recent 161 and 188 lbs both in chart. Please obtain accurate weight. Constitutional: NAD General: WNWD EYES: anicteric sclera, lids intact, no discharge  ENMT: intact hearing, oral mucous membranes moist, dentition intact CV:  no LE edema Pulmonary: no increased work of breathing, no cough, room air Abdomen: intake 100%, soft and non tender, no  ascites GU: deferred MSK: + sarcopenia, moves all extremities, ambulatory-with walker or cane. Skin: warm and dry, no rashes or wounds on visible skin Neuro:  mild  generalized weakness,  no cognitive impairment Psych: non-anxious affect, A and O x 3 Hem/lymph/immuno: no widespread bruising   Thank you for the opportunity to participate in the care of Ms. Mcsweeney.  The palliative care team will continue to follow. Please call our office at [phone] if we can be of additional assistance.   [name],  RN  Jason Coop DNP, MPH, AGPCNP-BC, [facility]   COVID-19 PATIENT SCREENING TOOL Asked and negative response unless otherwise noted:   Have you had symptoms of covid, tested positive or been in contact with someone with symptoms/positive test in the past 5-10 days?

## 2021-09-29 ENCOUNTER — Ambulatory Visit: Payer: Medicare Other

## 2021-10-12 ENCOUNTER — Telehealth: Payer: Medicare Other

## 2021-10-22 ENCOUNTER — Ambulatory Visit: Payer: Medicare Other

## 2021-11-03 ENCOUNTER — Other Ambulatory Visit: Payer: Medicare Other | Admitting: Primary Care

## 2021-11-03 DIAGNOSIS — C220 Liver cell carcinoma: Secondary | ICD-10-CM

## 2021-11-03 DIAGNOSIS — I5032 Chronic diastolic (congestive) heart failure: Secondary | ICD-10-CM | POA: Diagnosis not present

## 2021-11-03 DIAGNOSIS — Z515 Encounter for palliative care: Secondary | ICD-10-CM

## 2021-11-03 DIAGNOSIS — R531 Weakness: Secondary | ICD-10-CM

## 2021-11-03 DIAGNOSIS — K7030 Alcoholic cirrhosis of liver without ascites: Secondary | ICD-10-CM

## 2021-11-03 NOTE — Progress Notes (Signed)
Therapist, nutritional Palliative Care Consult Note Telephone: [phone]  Fax: [phone]    Date of encounter: 11/03/21 2:11 PM PATIENT NAME: [name] [address] [place] [address]   [phone] (home)  DOB: 12/22/1968 MRN: [id number] PRIMARY CARE PROVIDER:    Alan Ripper,  [address] [place] [address] [phone]  REFERRING PROVIDER:   Alan Ripper [address] [place],  [place] [address] [phone]  RESPONSIBLE PARTY:    Contact Information     Name Relation Home Work Mobile   Adriana Miller,Adriana Miller Brother [phone]  [phone]        I met face to face with patient in home. Palliative Care was asked to follow this patient by consultation request of  McDonough, Salomon Fick, PA* to address advance care planning and complex medical decision making. This is a follow up visit.                                   ASSESSMENT AND PLAN / RECOMMENDATIONS:   Advance Care Planning/Goals of Care: Goals include to maximize quality of life and symptom management. Patient/health care surrogate gave his/her permission to discuss.Our advance care planning conversation included a discussion about:    Exploration of personal, cultural or spiritual beliefs that might influence medical decisions   Review of an  advance directive document . No changes CODE STATUS: DNR  Symptom Management/Plan:  Debility: Endorses less mobility, using walker when she leaves home occ but usually cane.  Can only get up from rollator, usually stays in bedroom.  May benefit from PT for transfers, strengthening.  Pain: Has some in wrist, takes tylenol prn. Has a brace. Discussed exerciser  hand grip, will try to obtain.   Nutrition: States good  appetite,  gets MOW. Does not have a  way to check  her sugars see below.  Glucose control: states machine broke recently. Needs A1C, last was 1/23.. I asked her to call insurance company and ask  for new meter, Has daily insulin and no way to check BG. States she will call insurance company but perhaps PCP can provide on next visit.  Follow up: has upcoming appts, clarified for patient.   Mobility: poor, takes a long time to get to door , a long time to rise. Walking with altered gait. High fall risk. Had fall which led to broken wrist, but slipped on leaking water heater.  Follow up Palliative Care Visit: Palliative care will continue to follow for complex medical decision making, advance care planning, and clarification of goals. Return 8 weeks or prn.  I spent 40 minutes providing this consultation. More than 50% of the time in this consultation was spent in counseling and care coordination.   PPS: 40%  HOSPICE ELIGIBILITY/DIAGNOSIS: TBD  Chief Complaint: debility  HISTORY OF PRESENT ILLNESS:  Adriana Miller is a 66 y.o. year old female  with DM with insulin, debility, gait disturbances, hepatocellular cancer. Patient seen today to review palliative care needs to include medical decision making and advance care planning as appropriate.   History obtained from review of EMR, discussion with primary team, and interview with family, facility staff/caregiver and/or Adriana Miller.  I reviewed available labs, medications, imaging, studies and related documents from the EMR.  Records reviewed and summarized above.   ROS   General: NAD EYES: denies vision changes ENMT: denies dysphagia Cardiovascular: denies  chest pain, denies DOE Pulmonary: denies cough, denies increased SOB Abdomen: endorses good appetite, denies constipation, endorses continence of bowel GU: denies dysuria, endorses continence of urine MSK:  endorses increased weakness,  no recent  falls reported Skin: denies rashes or wounds Neurological:  endorses R wrist pain, denies insomnia Psych: Endorses positive mood endorses sleeping during day, up all night. Had worked night shifts for years.  Physical  Exam: Current and past weights: 188 lbs on record Constitutional: NAD General: frail appearing EYES: anicteric sclera, lids intact, no discharge  ENMT: intact hearing, oral mucous membranes moist CV: no LE edema Pulmonary:no increased work of breathing, no cough, room air Abdomen: intake 100%, soft and non tender MSK: + sarcopenia, moves all extremities, ambulatory Skin: warm and dry, no rashes or wounds on visible skin Neuro: + generalized weakness,  no cognitive impairment, non-anxious affect   Thank you for the opportunity to participate in the care of Adriana Miller.  The palliative care team will continue to follow. Please call our office at [phone] if we can be of additional assistance.   Jason Coop, NP DNP, AGPCNP-BC  COVID-19 PATIENT SCREENING TOOL Asked and negative response unless otherwise noted:   Have you had symptoms of covid, tested positive or been in contact with someone with symptoms/positive test in the past 5-10 days?

## 2021-11-05 ENCOUNTER — Ambulatory Visit (INDEPENDENT_AMBULATORY_CARE_PROVIDER_SITE_OTHER): Payer: Medicare Other | Admitting: Internal Medicine

## 2021-11-05 ENCOUNTER — Encounter: Payer: Self-pay | Admitting: Internal Medicine

## 2021-11-05 VITALS — BP 124/68 | HR 65 | Ht 66.0 in | Wt 183.0 lb

## 2021-11-05 DIAGNOSIS — I480 Paroxysmal atrial fibrillation: Secondary | ICD-10-CM

## 2021-11-05 NOTE — Progress Notes (Signed)
Follow-up Outpatient Visit Date: 11/05/2021  Primary Care Provider: Carolynne Edouard [address] [place] [address]  Chief Complaint: Follow-up atrial fibrillation  HPI:  Adriana Miller is a 66 y.o. 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  I last saw her in April for urgent preop evaluation in anticipation of ORIF of right distal radius fracture.  She was doing well from a heart standpoint at that time.  Only medication change was dose escalation of apixaban to 5 mg twice daily based on her age, weight, and renal function following surgery.  Today, Adriana Miller reports that she is feeling well.  Her only complaint is of continued weakness in the right hand following ORIF in April by Dr. Rudene Christians.  She has not been seen in follow-up by orthopedics since early May.  She is not doing any physical therapy.  From a heart standpoint, she has been feeling well without chest pain, shortness of breath, palpitations, lightheadedness, edema, or bleeding.  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 ascites ([facility])    B12 deficiency 04/05/2017   Blood in stool    Cerebral aneurysm    Chronic kidney disease    Diabetes mellitus without complication ([facility])    type 2   Gastric erosion with bleeding    Hepatocellular carcinoma ([facility])    Hyperlipidemia    Hypertension    Lower extremity cellulitis 02/08/2019   Paroxysmal A-fib ([facility])    Seizure disorder [facility])    Past Surgical History:  Procedure Laterality Date   CEREBRAL ANEURYSM REPAIR  1991    COLONOSCOPY WITH PROPOFOL N/A 02/12/2019   Procedure: COLONOSCOPY WITH PROPOFOL;  Surgeon: Lin Landsman, MD;  Location: [facility] ENDOSCOPY;  Service: Gastroenterology;  Laterality: N/A;   ESOPHAGOGASTRODUODENOSCOPY (EGD) WITH PROPOFOL N/A 02/12/2019   Procedure: ESOPHAGOGASTRODUODENOSCOPY (EGD) WITH PROPOFOL;  Surgeon: Lin Landsman, MD;  Location: [facility] ENDOSCOPY;  Service: Gastroenterology;  Laterality: N/A;   IR ANGIOGRAM SELECTIVE EACH ADDITIONAL VESSEL  09/21/2019   IR ANGIOGRAM SELECTIVE EACH ADDITIONAL VESSEL  09/21/2019   IR ANGIOGRAM SELECTIVE EACH ADDITIONAL VESSEL  09/21/2019   IR ANGIOGRAM SELECTIVE EACH ADDITIONAL VESSEL  09/21/2019   IR ANGIOGRAM SELECTIVE EACH ADDITIONAL VESSEL  10/02/2019   IR ANGIOGRAM VISCERAL SELECTIVE  09/21/2019   IR ANGIOGRAM VISCERAL SELECTIVE  09/21/2019   IR ANGIOGRAM VISCERAL SELECTIVE  10/02/2019   IR EMBO TUMOR ORGAN ISCHEMIA INFARCT INC GUIDE ROADMAPPING  10/02/2019   IR EMBO TUMOR ORGAN ISCHEMIA INFARCT INC GUIDE ROADMAPPING  10/02/2019   IR RADIOLOGIST EVAL & MGMT  08/28/2019   IR RADIOLOGIST EVAL & MGMT  01/30/2020   IR RADIOLOGIST EVAL & MGMT  04/30/2020   IR RADIOLOGIST EVAL & MGMT  07/30/2020   IR RADIOLOGIST EVAL & MGMT  10/28/2020   IR RADIOLOGIST EVAL & MGMT  02/24/2021   IR RADIOLOGIST EVAL & MGMT  06/08/2021   IR US GUIDE VASC ACCESS LEFT  09/21/2019   IR US GUIDE VASC ACCESS LEFT  10/02/2019   OPEN REDUCTION INTERNAL FIXATION (ORIF) DISTAL RADIAL FRACTURE Right 08/06/2021   Procedure: OPEN REDUCTION INTERNAL FIXATION (ORIF) DISTAL RADIAL FRACTURE;  Surgeon: Hessie Knows, MD;  Location: [facility] ORS;  Service: Orthopedics;  Laterality: Right;  Current Meds  Medication Sig   ACCU-CHEK GUIDE test strip USE AS INSTRUCTED ONCE A DAILY   allopurinol (ZYLOPRIM) 100 MG tablet TAKE 1 TABLET BY MOUTH EVERY DAY   apixaban (ELIQUIS) 5 MG TABS tablet TAKE 1 TABLET BY MOUTH TWICE A DAY   BD PEN NEEDLE NANO 2ND GEN 32G X 4 MM MISC USE AS DIRECTED WITH TOUJEO    escitalopram (LEXAPRO) 10 MG tablet TAKE 1 TABLET (10 MG TOTAL) BY MOUTH DAILY. FOR ANXIETY/SLEEP   furosemide (LASIX) 20 MG tablet Take 1 tablet (20 mg total) by mouth daily.   glimepiride (AMARYL) 1 MG tablet TAKE 1 TABLET BY MOUTH EVERY DAY WITH BREAKFAST   HYDROcodone-acetaminophen (NORCO) 5-325 MG tablet Take 1 tablet by mouth every 6 (six) hours as needed for moderate pain.   insulin glargine, 1 Unit Dial, (TOUJEO SOLOSTAR) 300 UNIT/ML Solostar Pen INJECT 30 UNITS SUBCUTANEOUSLY IN THE EVENING WITH FOOD   metoprolol tartrate (LOPRESSOR) 25 MG tablet TAKE 0.5 TABLETS BY MOUTH 2 TIMES DAILY.   mirtazapine (REMERON) 7.5 MG tablet TAKE 1 TABLET BY MOUTH EVERYDAY AT BEDTIME   Safety Lancets 28G MISC Use as directed once a daily Dx e11.65   spironolactone (ALDACTONE) 25 MG tablet TAKE 1 TABLET (25 MG TOTAL) BY MOUTH DAILY. FOR CIRRHOSIS OF LIVER   traMADol (ULTRAM) 50 MG tablet Take 50 mg by mouth every 6 (six) hours as needed.   traZODone (DESYREL) 50 MG tablet TAKE 1-2 TABS BY MOUTH AT NIGHT BEFORE BED FOR INSOMNIA    Allergies: Aspirin  Social History   Tobacco Use   Smoking status: Never   Smokeless tobacco: Current    Types: Snuff  Vaping Use   Vaping Use: Never used  Substance Use Topics   Alcohol use: Yes    Alcohol/week: 14.0 standard drinks of alcohol    Types: 14 Cans of beer per week    Comment: twice a week (beer)   Drug use: No    Family History  Problem Relation Age of Onset   Heart attack Brother 36    Review of Systems: A 12-system review of systems was performed and was negative except as noted in the HPI.  --------------------------------------------------------------------------------------------------  Physical Exam: BP 124/68   Pulse 65   Ht '5\' 6"'$  (1.676 m)   Wt 183 lb (83 kg)   SpO2 (!) 65%   BMI 29.54 kg/m   General:  NAD. Neck: No JVD or HJR. Lungs: Clear to auscultation bilaterally without wheezes or crackles. Heart: Regular rate and  rhythm without murmurs, rubs, or gallops. Abdomen: Soft, nontender, nondistended. Extremities: No lower extremity edema.  EKG: Normal sinus rhythm without abnormality.  Lab Results  Component Value Date   WBC 5.2 07/23/2021   HGB 13.3 07/23/2021   HCT 41.0 07/23/2021   MCV 97.6 07/23/2021   PLT 169 07/23/2021    Lab Results  Component Value Date   NA 135 07/23/2021   K 3.4 (L) 07/23/2021   CL 101 07/23/2021   CO2 23 07/23/2021   BUN 12 07/23/2021   CREATININE 1.34 (H) 07/23/2021   GLUCOSE 159 (H) 07/23/2021   ALT 45 (H) 09/30/2020    Lab Results  Component Value Date   CHOL 134 01/27/2018   HDL 80 01/27/2018   LDLCALC 42 01/27/2018   TRIG 60 01/27/2018    --------------------------------------------------------------------------------------------------  ASSESSMENT AND PLAN: Paroxysmal atrial fibrillation/flutter: Adriana Miller on remains asymptomatic and is in sinus rhythm today.  We will plan to continue her  current medications including apixaban 5 mg twice daily and metoprolol tartrate 12.5 mg twice daily.  Cirrhosis: Adriana Miller on appears euvolemic on examination today.  Continue current doses of furosemide and spironolactone with ongoing follow-up with hepatology.  Right hand weakness following distal radius fracture: I have asked Adriana Miller on to reach out to Dr. Theodore Demark office for reevaluation.  She may benefit from physical therapy.  Hypertension associated with type 2 diabetes mellitus: Blood pressure well controlled today.  Continue current regimen of metoprolol and spironolactone.  Ongoing management of diabetes per PCP.  Follow-up: Return to clinic in 6 months.  Nelva Bush, MD 11/05/2021 4:25 PM

## 2021-11-05 NOTE — Patient Instructions (Signed)
Medication Instructions:  Your physician recommends that you continue on your current medications as directed. Please refer to the Current Medication list given to you today.  *If you need a refill on your cardiac medications before your next appointment, please call your pharmacy*  Lab Work: NONE ordered at this time of appointment   If you have labs (blood work) drawn today and your tests are completely normal, you will receive your results only by: [place] (if you have MyChart) OR A paper copy in the mail If you have any lab test that is abnormal or we need to change your treatment, we will call you to review the results.  Testing/Procedures: NONE ordered at this time of appointment   Follow-Up: At [facility], you and your health needs are our priority.  As part of our continuing mission to provide you with exceptional heart care, we have created designated Provider Care Teams.  These Care Teams include your primary Cardiologist (physician) and Advanced Practice Providers (APPs -  Physician Assistants and Nurse Practitioners) who all work together to provide you with the care you need, when you need it.  We recommend signing up for the patient portal called "MyChart".  Sign up information is provided on this After Visit Summary.  MyChart is used to connect with patients for Virtual Visits (Telemedicine).  Patients are able to view lab/test results, encounter notes, upcoming appointments, etc.  Non-urgent messages can be sent to your provider as well.   To learn more about what you can do with MyChart, go to [email or web address].    Your next appointment:   6 month(s)  The format for your next appointment:   In Person  Provider:   You may see Nelva Bush, MD or one of the following Advanced Practice Providers on your designated Care Team:   Murray Hodgkins, NP Christell Faith, PA-C Cadence Kathlen Mody, [place]    Other Instructions Call Dr.Menz at [phone] to follow up  about your hand   Important Information About Sugar

## 2021-11-07 ENCOUNTER — Encounter: Payer: Self-pay | Admitting: Internal Medicine

## 2021-11-09 ENCOUNTER — Encounter: Payer: Self-pay | Admitting: Physician Assistant

## 2021-11-09 ENCOUNTER — Ambulatory Visit (INDEPENDENT_AMBULATORY_CARE_PROVIDER_SITE_OTHER): Payer: Medicare Other | Admitting: Physician Assistant

## 2021-11-09 ENCOUNTER — Other Ambulatory Visit: Payer: Self-pay

## 2021-11-09 VITALS — BP 144/60 | HR 78 | Temp 98.3°F | Resp 16 | Ht 66.0 in | Wt 184.0 lb

## 2021-11-09 DIAGNOSIS — R946 Abnormal results of thyroid function studies: Secondary | ICD-10-CM | POA: Diagnosis not present

## 2021-11-09 DIAGNOSIS — E559 Vitamin D deficiency, unspecified: Secondary | ICD-10-CM

## 2021-11-09 DIAGNOSIS — K7031 Alcoholic cirrhosis of liver with ascites: Secondary | ICD-10-CM

## 2021-11-09 DIAGNOSIS — E782 Mixed hyperlipidemia: Secondary | ICD-10-CM | POA: Diagnosis not present

## 2021-11-09 DIAGNOSIS — E538 Deficiency of other specified B group vitamins: Secondary | ICD-10-CM | POA: Diagnosis not present

## 2021-11-09 DIAGNOSIS — E119 Type 2 diabetes mellitus without complications: Secondary | ICD-10-CM

## 2021-11-09 DIAGNOSIS — C220 Liver cell carcinoma: Secondary | ICD-10-CM | POA: Diagnosis not present

## 2021-11-09 DIAGNOSIS — Z794 Long term (current) use of insulin: Secondary | ICD-10-CM

## 2021-11-09 DIAGNOSIS — I480 Paroxysmal atrial fibrillation: Secondary | ICD-10-CM | POA: Diagnosis not present

## 2021-11-09 DIAGNOSIS — R5383 Other fatigue: Secondary | ICD-10-CM

## 2021-11-09 DIAGNOSIS — I10 Essential (primary) hypertension: Secondary | ICD-10-CM

## 2021-11-09 LAB — POCT GLYCOSYLATED HEMOGLOBIN (HGB A1C): Hemoglobin A1C: 5.8 % — AB (ref 4.0–5.6)

## 2021-11-09 MED ORDER — ACCU-CHEK GUIDE ME W/DEVICE KIT
PACK | 0 refills | Status: DC
Start: 2021-11-09 — End: 2022-07-30

## 2021-11-09 MED ORDER — DEXCOM G7 RECEIVER DEVI: 3 refills | Status: DC

## 2021-11-09 MED ORDER — DEXCOM G7 SENSOR MISC: 3 refills | Status: DC

## 2021-11-09 NOTE — Progress Notes (Signed)
[facility] [place], [place] [address]  Internal MEDICINE  Office Visit Note  Patient Name: Adriana Miller  [id number]  [id number]  Date of Service: 11/15/2021  Chief Complaint  Patient presents with   Follow-up   Diabetes    Would like monitor that goes on arm   Hypertension   Hyperlipidemia   Quality Metric Gaps    Eye Exam, Bone Density and Mammogram    HPI Pt is here for routine follow up -Had ORIF in right wrist, she did not do any therapy and is going to call ortho about this because still weak. -Glucose monitor broke and would like dexcom sensor. Will attempt to order this but explained these often arent approved and will go ahead and replace regular glucose monitor as well. Has been taking 30units toujeo and based on A1c will drop to 26units toujeo daily and monitor BG. -Followed by palliative care -due for routine fasting labs  Current Medication: Outpatient Encounter Medications as of 11/09/2021  Medication Sig   ACCU-CHEK GUIDE test strip USE AS INSTRUCTED ONCE A DAILY   allopurinol (ZYLOPRIM) 100 MG tablet TAKE 1 TABLET BY MOUTH EVERY DAY   apixaban (ELIQUIS) 5 MG TABS tablet TAKE 1 TABLET BY MOUTH TWICE A DAY   BD PEN NEEDLE NANO 2ND GEN 32G X 4 MM MISC USE AS DIRECTED WITH TOUJEO   escitalopram (LEXAPRO) 10 MG tablet TAKE 1 TABLET (10 MG TOTAL) BY MOUTH DAILY. FOR ANXIETY/SLEEP   furosemide (LASIX) 20 MG tablet Take 1 tablet (20 mg total) by mouth daily.   glimepiride (AMARYL) 1 MG tablet TAKE 1 TABLET BY MOUTH EVERY DAY WITH BREAKFAST   HYDROcodone-acetaminophen (NORCO) 5-325 MG tablet Take 1 tablet by mouth every 6 (six) hours as needed for moderate pain.   insulin glargine, 1 Unit Dial, (TOUJEO SOLOSTAR) 300 UNIT/ML Solostar Pen INJECT 30 UNITS SUBCUTANEOUSLY IN THE EVENING WITH FOOD   metoprolol tartrate (LOPRESSOR) 25 MG tablet TAKE 0.5 TABLETS BY MOUTH 2 TIMES DAILY.   mirtazapine (REMERON) 7.5 MG tablet TAKE 1 TABLET BY MOUTH  EVERYDAY AT BEDTIME   Safety Lancets 28G MISC Use as directed once a daily Dx e11.65   spironolactone (ALDACTONE) 25 MG tablet TAKE 1 TABLET (25 MG TOTAL) BY MOUTH DAILY. FOR CIRRHOSIS OF LIVER   traMADol (ULTRAM) 50 MG tablet Take 50 mg by mouth every 6 (six) hours as needed.   traZODone (DESYREL) 50 MG tablet TAKE 1-2 TABS BY MOUTH AT NIGHT BEFORE BED FOR INSOMNIA   [DISCONTINUED] Continuous Blood Gluc Receiver ([place]) DEVI by Does not apply route.   [DISCONTINUED] Continuous Blood Gluc Sensor (DEXCOM G7 SENSOR) MISC by Does not apply route.   Continuous Blood Gluc Receiver (DEXCOM G7 RECEIVER) DEVI Use for continuous blood glucose monitoring  DX E11.65   Continuous Blood Gluc Sensor (DEXCOM G7 SENSOR) MISC Use as directed for continuous glucose monitoring.  Change sensor every 10 days   E11.65   No facility-administered encounter medications on file as of 11/09/2021.    Surgical History: Past Surgical History:  Procedure Laterality Date   CEREBRAL ANEURYSM REPAIR  1991   COLONOSCOPY WITH PROPOFOL N/A 02/12/2019   Procedure: COLONOSCOPY WITH PROPOFOL;  Surgeon: Lin Landsman, MD;  Location: [facility] ENDOSCOPY;  Service: Gastroenterology;  Laterality: N/A;   ESOPHAGOGASTRODUODENOSCOPY (EGD) WITH PROPOFOL N/A 02/12/2019   Procedure: ESOPHAGOGASTRODUODENOSCOPY (EGD) WITH PROPOFOL;  Surgeon: Lin Landsman, MD;  Location: [facility] ENDOSCOPY;  Service: Gastroenterology;  Laterality: N/A;  IR ANGIOGRAM SELECTIVE EACH ADDITIONAL VESSEL  09/21/2019   IR ANGIOGRAM SELECTIVE EACH ADDITIONAL VESSEL  09/21/2019   IR ANGIOGRAM SELECTIVE EACH ADDITIONAL VESSEL  09/21/2019   IR ANGIOGRAM SELECTIVE EACH ADDITIONAL VESSEL  09/21/2019   IR ANGIOGRAM SELECTIVE EACH ADDITIONAL VESSEL  10/02/2019   IR ANGIOGRAM VISCERAL SELECTIVE  09/21/2019   IR ANGIOGRAM VISCERAL SELECTIVE  09/21/2019   IR ANGIOGRAM VISCERAL SELECTIVE  10/02/2019   IR EMBO TUMOR ORGAN ISCHEMIA INFARCT INC GUIDE ROADMAPPING  10/02/2019    IR EMBO TUMOR ORGAN ISCHEMIA INFARCT INC GUIDE ROADMAPPING  10/02/2019   IR RADIOLOGIST EVAL & MGMT  08/28/2019   IR RADIOLOGIST EVAL & MGMT  01/30/2020   IR RADIOLOGIST EVAL & MGMT  04/30/2020   IR RADIOLOGIST EVAL & MGMT  07/30/2020   IR RADIOLOGIST EVAL & MGMT  10/28/2020   IR RADIOLOGIST EVAL & MGMT  02/24/2021   IR RADIOLOGIST EVAL & MGMT  06/08/2021   IR US GUIDE VASC ACCESS LEFT  09/21/2019   IR US GUIDE VASC ACCESS LEFT  10/02/2019   OPEN REDUCTION INTERNAL FIXATION (ORIF) DISTAL RADIAL FRACTURE Right 08/06/2021   Procedure: OPEN REDUCTION INTERNAL FIXATION (ORIF) DISTAL RADIAL FRACTURE;  Surgeon: Hessie Knows, MD;  Location: [facility] ORS;  Service: Orthopedics;  Laterality: Right;    Medical History: Past Medical History:  Diagnosis Date   Alcoholic cirrhosis of liver without ascites ([place])    B12 deficiency 04/05/2017   Blood in stool    Cerebral aneurysm    Chronic kidney disease    Diabetes mellitus without complication (HCC)    type 2   Gastric erosion with bleeding    Hepatocellular carcinoma ([place])    Hyperlipidemia    Hypertension    Lower extremity cellulitis 02/08/2019   Paroxysmal A-fib (HCC)    Seizure disorder (HCC)     Family History: Family History  Problem Relation Age of Onset   Heart attack Brother 45    Social History   Socioeconomic History   Marital status: Divorced    Spouse name: Not on file   Number of children: Not on file   Years of education: Not on file   Highest education level: Not on file  Occupational History   Not on file  Tobacco Use   Smoking status: Never   Smokeless tobacco: Current    Types: Snuff  Vaping Use   Vaping Use: Never used  Substance and Sexual Activity   Alcohol use: Yes    Alcohol/week: 14.0 standard drinks of alcohol    Types: 14 Cans of beer per week    Comment: twice a week (beer)   Drug use: No   Sexual activity: Not Currently  Other Topics Concern   Not on file  Social History Narrative   Lives alone    Social Determinants of Health   Financial Resource Strain: Not on file  Food Insecurity: Not on file  Transportation Needs: Not on file  Physical Activity: Not on file  Stress: Not on file  Social Connections: Not on file  Intimate Partner Violence: Not on file      Review of Systems  Constitutional:  Negative for chills and unexpected weight change.  HENT:  Negative for congestion, rhinorrhea, sneezing and sore throat.   Eyes:  Negative for redness.  Respiratory:  Negative for cough, chest tightness and shortness of breath.   Cardiovascular:  Negative for chest pain and palpitations.  Gastrointestinal:  Negative for abdominal pain, constipation, diarrhea, nausea and vomiting.  Genitourinary:  Negative for dysuria and frequency.  Musculoskeletal:  Positive for arthralgias. Negative for back pain, joint swelling and neck pain.       Right wrist weakness  Skin:  Negative for rash.  Neurological: Negative.  Negative for tremors and numbness.  Hematological:  Negative for adenopathy. Does not bruise/bleed easily.  Psychiatric/Behavioral:  Positive for sleep disturbance. Negative for behavioral problems (Depression) and suicidal ideas. The patient is not nervous/anxious.     Vital Signs: BP (!) 144/60 Comment: 152/64  Pulse 78   Temp 98.3 F (36.8 C)   Resp 16   Ht '5\' 6"'$  (1.676 m)   Wt 184 lb (83.5 kg)   SpO2 98%   BMI 29.70 kg/m    Physical Exam Constitutional:      General: She is not in acute distress.    Appearance: She is well-developed. She is not diaphoretic.  HENT:     Head: Normocephalic and atraumatic.     Mouth/Throat:     Pharynx: No oropharyngeal exudate.  Eyes:     Pupils: Pupils are equal, round, and reactive to light.  Neck:     Thyroid: No thyromegaly.     Vascular: No JVD.     Trachea: No tracheal deviation.  Cardiovascular:     Rate and Rhythm: Normal rate and regular rhythm.     Heart sounds: Normal heart sounds. No murmur heard.    No  friction rub. No gallop.  Pulmonary:     Effort: Pulmonary effort is normal. No respiratory distress.     Breath sounds: No wheezing or rales.  Chest:     Chest wall: No tenderness.  Abdominal:     General: Bowel sounds are normal.     Palpations: Abdomen is soft.  Musculoskeletal:        General: Normal range of motion.     Cervical back: Normal range of motion and neck supple.  Lymphadenopathy:     Cervical: No cervical adenopathy.  Skin:    General: Skin is warm and dry.  Neurological:     Mental Status: She is alert and oriented to person, place, and time.     Cranial Nerves: No cranial nerve deficit.     Comments: Utilizes cane for mobility  Psychiatric:        Behavior: Behavior normal.        Thought Content: Thought content normal.        Judgment: Judgment normal.        Assessment/Plan: 1. Type 2 diabetes mellitus without complication, with long-term current use of insulin (HCC) - POCT HgB A1C is 5.8 which is improved from 6.5 last visit. Will lower to 26units toujeo and continue '1mg'$  amaryl and continue to monitor. May be able to reduce further in future. Will pick up new glucose monitor and will try ordering dexcom monitor  2. Essential hypertension Mildly elevated in office  3. Hepatocellular carcinoma ([place]) Followed by onc and palliative care  4. Alcoholic cirrhosis of liver with ascites ([place]) Followed by onc and palliative care  5. Paroxysmal atrial fibrillation ([place]) Followed by cardiology  6. Mixed hyperlipidemia - Lipid Panel With LDL/HDL Ratio  7. Vitamin D deficiency - VITAMIN D 25 Hydroxy (Vit-D Deficiency, Fractures)  8. B12 deficiency - B12 and Folate Panel  9. Abnormal thyroid exam - TSH + free T4  10. Other fatigue - CBC w/Diff/Platelet - Comprehensive metabolic panel - TSH + free T4 - Lipid Panel With LDL/HDL Ratio - B12 and  Folate Panel - VITAMIN D 25 Hydroxy (Vit-D Deficiency, Fractures) - Fe+TIBC+Fer   General Counseling:  Karri verbalizes understanding of the findings of todays visit and agrees with plan of treatment. I have discussed any further diagnostic evaluation that may be needed or ordered today. We also reviewed her medications today. she has been encouraged to call the office with any questions or concerns that should arise related to todays visit.    Orders Placed This Encounter  Procedures   CBC w/Diff/Platelet   Comprehensive metabolic panel   TSH + free T4   Lipid Panel With LDL/HDL Ratio   B12 and Folate Panel   VITAMIN D 25 Hydroxy (Vit-D Deficiency, Fractures)   Fe+TIBC+Fer   POCT HgB A1C    Meds ordered this encounter  Medications   Continuous Blood Gluc Receiver ([place]) DEVI    Sig: Use for continuous blood glucose monitoring  DX E11.65    Dispense:  1 each    Refill:  3   Continuous Blood Gluc Sensor (DEXCOM G7 SENSOR) MISC    Sig: Use as directed for continuous glucose monitoring.  Change sensor every 10 days   E11.65    Dispense:  3 each    Refill:  3    This patient was seen by Drema Dallas, PA-C in collaboration with Dr. Clayborn Bigness as a part of collaborative care agreement.   Total time spent:30 Minutes Time spent includes review of chart, medications, test results, and follow up plan with the patient.      Dr Lavera Guise Internal medicine

## 2021-11-10 ENCOUNTER — Telehealth: Payer: Medicare Other

## 2021-11-16 ENCOUNTER — Ambulatory Visit
Admission: RE | Admit: 2021-11-16 | Discharge: 2021-11-16 | Disposition: A | Discharge status: Home / Self Care | Payer: Medicare Other | Source: Ambulatory Visit | Attending: Interventional Radiology | Admitting: Interventional Radiology

## 2021-11-16 ENCOUNTER — Other Ambulatory Visit
Admission: RE | Admit: 2021-11-16 | Discharge: 2021-11-16 | Disposition: A | Discharge status: Home / Self Care | Payer: Medicare Other | Source: Ambulatory Visit | Attending: Interventional Radiology | Admitting: Interventional Radiology

## 2021-11-16 DIAGNOSIS — E538 Deficiency of other specified B group vitamins: Secondary | ICD-10-CM | POA: Insufficient documentation

## 2021-11-16 DIAGNOSIS — K8020 Calculus of gallbladder without cholecystitis without obstruction: Secondary | ICD-10-CM | POA: Diagnosis not present

## 2021-11-16 DIAGNOSIS — K7460 Unspecified cirrhosis of liver: Secondary | ICD-10-CM | POA: Diagnosis not present

## 2021-11-16 DIAGNOSIS — C220 Liver cell carcinoma: Secondary | ICD-10-CM | POA: Insufficient documentation

## 2021-11-16 DIAGNOSIS — E559 Vitamin D deficiency, unspecified: Secondary | ICD-10-CM | POA: Insufficient documentation

## 2021-11-16 DIAGNOSIS — E782 Mixed hyperlipidemia: Secondary | ICD-10-CM | POA: Insufficient documentation

## 2021-11-16 DIAGNOSIS — R946 Abnormal results of thyroid function studies: Secondary | ICD-10-CM | POA: Insufficient documentation

## 2021-11-16 DIAGNOSIS — Z923 Personal history of irradiation: Secondary | ICD-10-CM | POA: Insufficient documentation

## 2021-11-16 DIAGNOSIS — R5383 Other fatigue: Secondary | ICD-10-CM | POA: Insufficient documentation

## 2021-11-16 DIAGNOSIS — K7689 Other specified diseases of liver: Secondary | ICD-10-CM | POA: Diagnosis not present

## 2021-11-16 DIAGNOSIS — Z8505 Personal history of malignant neoplasm of liver: Secondary | ICD-10-CM | POA: Diagnosis not present

## 2021-11-16 LAB — LIPID PANEL
Cholesterol: 134 mg/dL (ref 0–200)
HDL: 32 mg/dL — ABNORMAL LOW (ref >40)
LDL Cholesterol: 88 mg/dL (ref 0–99)
Total CHOL/HDL Ratio: 4.2 RATIO
Triglycerides: 71 mg/dL (ref <150)
VLDL: 14 mg/dL (ref 0–40)

## 2021-11-16 LAB — T4, FREE: Free T4: 1.09 ng/dL (ref 0.61–1.12)

## 2021-11-16 LAB — CBC WITH DIFFERENTIAL/PLATELET
Abs Immature Granulocytes: 0.02 10*3/uL (ref 0.00–0.07)
Basophils Absolute: 0 10*3/uL (ref 0.0–0.1)
Basophils Relative: 0 %
Eosinophils Absolute: 0 10*3/uL (ref 0.0–0.5)
Eosinophils Relative: 1 %
HCT: 41.9 % (ref 36.0–46.0)
Hemoglobin: 13.5 g/dL (ref 12.0–15.0)
Immature Granulocytes: 0 %
Lymphocytes Relative: 18 %
Lymphs Abs: 1.1 10*3/uL (ref 0.7–4.0)
MCH: 31.2 pg (ref 26.0–34.0)
MCHC: 32.2 g/dL (ref 30.0–36.0)
MCV: 96.8 fL (ref 80.0–100.0)
Monocytes Absolute: 0.4 10*3/uL (ref 0.1–1.0)
Monocytes Relative: 6 %
Neutro Abs: 4.7 10*3/uL (ref 1.7–7.7)
Neutrophils Relative %: 75 %
Platelets: 181 10*3/uL (ref 150–400)
RBC: 4.33 MIL/uL (ref 3.87–5.11)
RDW: 13.3 % (ref 11.5–15.5)
WBC: 6.2 10*3/uL (ref 4.0–10.5)
nRBC: 0 % (ref 0.0–0.2)

## 2021-11-16 LAB — COMPREHENSIVE METABOLIC PANEL
ALT: 13 U/L (ref 0–44)
AST: 29 U/L (ref 15–41)
Albumin: 2.9 g/dL — ABNORMAL LOW (ref 3.5–5.0)
Alkaline Phosphatase: 72 U/L (ref 38–126)
Anion gap: 9 (ref 5–15)
BUN: 9 mg/dL (ref 8–23)
CO2: 28 mmol/L (ref 22–32)
Calcium: 8.3 mg/dL — ABNORMAL LOW (ref 8.9–10.3)
Chloride: 105 mmol/L (ref 98–111)
Creatinine, Ser: 1.02 mg/dL — ABNORMAL HIGH (ref 0.44–1.00)
GFR, Estimated: 60 mL/min — ABNORMAL LOW (ref >60)
Glucose, Bld: 95 mg/dL (ref 70–99)
Potassium: 3.1 mmol/L — ABNORMAL LOW (ref 3.5–5.1)
Sodium: 142 mmol/L (ref 135–145)
Total Bilirubin: 1.5 mg/dL — ABNORMAL HIGH (ref 0.3–1.2)
Total Protein: 7.9 g/dL (ref 6.5–8.1)

## 2021-11-16 LAB — IRON AND TIBC
Iron: 262 ug/dL — ABNORMAL HIGH (ref 28–170)
Saturation Ratios: 89 % — ABNORMAL HIGH (ref 10.4–31.8)
TIBC: 295 ug/dL (ref 250–450)
UIBC: 33 ug/dL

## 2021-11-16 LAB — VITAMIN B12: Vitamin B-12: 452 pg/mL (ref 180–914)

## 2021-11-16 LAB — VITAMIN D 25 HYDROXY (VIT D DEFICIENCY, FRACTURES): Vit D, 25-Hydroxy: 18.29 ng/mL — ABNORMAL LOW (ref 30–100)

## 2021-11-16 LAB — FOLATE: Folate: 4.5 ng/mL — ABNORMAL LOW (ref >5.9)

## 2021-11-16 LAB — TSH: TSH: 2.735 u[IU]/mL (ref 0.350–4.500)

## 2021-11-16 LAB — FERRITIN: Ferritin: 56 ng/mL (ref 11–307)

## 2021-11-16 MED ORDER — GADOBUTROL 1 MMOL/ML IV SOLN
8.0000 mL | Freq: Once | INTRAVENOUS | Status: AC | PRN
  Administered 2021-11-16: 8 mL via INTRAVENOUS

## 2021-11-24 ENCOUNTER — Inpatient Hospital Stay: Admission: RE | Admit: 2021-11-24 | Payer: Medicare Other | Source: Ambulatory Visit

## 2021-11-24 ENCOUNTER — Ambulatory Visit
Admission: RE | Admit: 2021-11-24 | Discharge: 2021-11-24 | Disposition: A | Discharge status: Home / Self Care | Payer: Medicare Other | Source: Ambulatory Visit | Attending: Interventional Radiology | Admitting: Interventional Radiology

## 2021-11-24 DIAGNOSIS — C220 Liver cell carcinoma: Secondary | ICD-10-CM | POA: Diagnosis not present

## 2021-11-24 DIAGNOSIS — Z9889 Other specified postprocedural states: Secondary | ICD-10-CM | POA: Diagnosis not present

## 2021-11-24 HISTORY — PX: IR RADIOLOGIST EVAL & MGMT: IMG5224

## 2021-11-24 NOTE — Progress Notes (Signed)
Chief Complaint: Patient was consulted remotely today (TeleHealth) for  EtOH cirrhosis complicated by hepatocellular ca at the request of Azeneth Carbonell K.    Referring Physician(s): Dr. Earlie Server  History of Present Illness: Adriana Miller is a 66 y.o. 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  She presented with a 2.0 cm segment II and 1.7 cm segment III mass and underwent split dose radiation segmentectomies on 10/02/19.     Her recovery was uneventful.  Initial post therapy liver MRI dated 01/03/2020 demonstrated an excellent response to therapy with no evidence of residual disease in the segment 2 lesion and equivocal residual enhancement in the segment 3 lesion.   MRI dated 04/14/2020 demonstrates continued treatment response to lesions in both the segment 2 and segment 3 with no evidence of residual viable tumor.  No new hepatic lesions are identified.   MRI 07/02/20: Treated hepatic segment II lesion  LR TR nonviable. Treated hepatic segment III lesion LR TR nonviable.  No new suspicious hepatic lesions.   MRI 10/25/20: Treated lesions in segments 2 and 3 of the liver again demonstrate no evidence of recurrence/viability. No new hepatic lesions are otherwise noted.     MRI 02/06/21: Redemonstrated, well-circumscribed, nonenhancing lesion of hepatic segment II, consistent with treated, nonviable hepatocellular carcinoma following embolization and radiotherapy.  Additional lesion of hepatic segment III previously reported is not appreciated on this examination.   MRI 06/05/21: 1. Unchanged, circumscribed, nonenhancing lesion of hepatic segment II, consistent with treated, nonviable hepatocellular carcinoma (LI-RADS TR nonviable). 2. As on prior examination, a previously seen lesion of hepatic segment III is not well appreciated and without suspicious contrast enhancement in this vicinity.  MRI 11/16/21: 1. Unchanged small  nonenhancing lesion of posterior hepatic segment II measuring 0.9 x 0.6 cm, consistent with treated hepatocellular carcinoma, without evidence of residual viable tissue. LI-RADS category TR, nonviable. 2. Focus of arterial hyperenhancement in the anterior right lobe of the liver, measuring 1.1 x 0.8 cm. No evidence of washout or capsular enhancement. LI-RADS category 3, intermediate suspicion for hepatocellular carcinoma. Recommend attention on follow-up at 6 months.   Adriana Miller reports that she is doing well and has no active complaints at this time.  Past Medical History:  Diagnosis Date   Alcoholic cirrhosis of liver without ascites ([place])    B12 deficiency 04/05/2017   Blood in stool    Cerebral aneurysm    Chronic kidney disease    Diabetes mellitus without complication ([place])    type 2   Gastric erosion with bleeding    Hepatocellular carcinoma ([place])    Hyperlipidemia    Hypertension    Lower extremity cellulitis 02/08/2019   Paroxysmal A-fib (HCC)    Seizure disorder [facility])     Past Surgical History:  Procedure Laterality Date   CEREBRAL ANEURYSM REPAIR  1991   COLONOSCOPY WITH PROPOFOL N/A 02/12/2019   Procedure: COLONOSCOPY WITH PROPOFOL;  Surgeon: Lin Landsman, MD;  Location: [facility] ENDOSCOPY;  Service: Gastroenterology;  Laterality: N/A;   ESOPHAGOGASTRODUODENOSCOPY (EGD) WITH PROPOFOL N/A 02/12/2019   Procedure: ESOPHAGOGASTRODUODENOSCOPY (EGD) WITH PROPOFOL;  Surgeon: Lin Landsman, MD;  Location: [facility] ENDOSCOPY;  Service: Gastroenterology;  Laterality: N/A;   IR ANGIOGRAM SELECTIVE EACH ADDITIONAL VESSEL  09/21/2019   IR ANGIOGRAM SELECTIVE EACH ADDITIONAL VESSEL  09/21/2019   IR ANGIOGRAM SELECTIVE EACH ADDITIONAL VESSEL  09/21/2019   IR ANGIOGRAM SELECTIVE EACH ADDITIONAL VESSEL  09/21/2019   IR [place]  ADDITIONAL VESSEL  10/02/2019   IR ANGIOGRAM VISCERAL SELECTIVE  09/21/2019   IR ANGIOGRAM VISCERAL SELECTIVE  09/21/2019   IR ANGIOGRAM VISCERAL  SELECTIVE  10/02/2019   IR EMBO TUMOR ORGAN ISCHEMIA INFARCT INC GUIDE ROADMAPPING  10/02/2019   IR EMBO TUMOR ORGAN ISCHEMIA INFARCT INC GUIDE ROADMAPPING  10/02/2019   IR RADIOLOGIST EVAL & MGMT  08/28/2019   IR RADIOLOGIST EVAL & MGMT  01/30/2020   IR RADIOLOGIST EVAL & MGMT  04/30/2020   IR RADIOLOGIST EVAL & MGMT  07/30/2020   IR RADIOLOGIST EVAL & MGMT  10/28/2020   IR RADIOLOGIST EVAL & MGMT  02/24/2021   IR RADIOLOGIST EVAL & MGMT  06/08/2021   IR RADIOLOGIST EVAL & MGMT  11/24/2021   IR US GUIDE VASC ACCESS LEFT  09/21/2019   IR US GUIDE VASC ACCESS LEFT  10/02/2019   OPEN REDUCTION INTERNAL FIXATION (ORIF) DISTAL RADIAL FRACTURE Right 08/06/2021   Procedure: OPEN REDUCTION INTERNAL FIXATION (ORIF) DISTAL RADIAL FRACTURE;  Surgeon: Hessie Knows, MD;  Location: [facility] ORS;  Service: Orthopedics;  Laterality: Right;    Allergies: Aspirin  Medications: Prior to Admission medications   Medication Sig Start Date End Date Taking? Authorizing Provider  ACCU-CHEK GUIDE test strip USE AS INSTRUCTED ONCE A DAILY 07/07/21   McDonough, Si Gaul, PA-C  allopurinol (ZYLOPRIM) 100 MG tablet TAKE 1 TABLET BY MOUTH EVERY DAY 08/21/21   McDonough, Lauren K, PA-C  apixaban (ELIQUIS) 5 MG TABS tablet TAKE 1 TABLET BY MOUTH TWICE A DAY 08/21/21   End, Harrell Gave, MD  BD PEN NEEDLE NANO 2ND GEN 32G X 4 MM MISC USE AS DIRECTED WITH TOUJEO 07/25/21   McDonough, Lauren K, PA-C  Blood Glucose Monitoring Suppl (ACCU-CHEK GUIDE ME) w/Device KIT Use as directed  dx E11.65 11/09/21   Mylinda Latina, PA-C  Continuous Blood Gluc Receiver (DEXCOM G7 RECEIVER) DEVI Use for continuous blood glucose monitoring  DX E11.65 11/09/21   McDonough, Lauren K, PA-C  Continuous Blood Gluc Sensor (DEXCOM G7 SENSOR) MISC Use as directed for continuous glucose monitoring.  Change sensor every 10 days   E11.65 11/09/21   McDonough, Ander Purpura K, PA-C  escitalopram (LEXAPRO) 10 MG tablet TAKE 1 TABLET (10 MG TOTAL) BY MOUTH DAILY. FOR ANXIETY/SLEEP  05/22/21   McDonough, Si Gaul, PA-C  furosemide (LASIX) 20 MG tablet Take 1 tablet (20 mg total) by mouth daily. 08/06/21   End, Harrell Gave, MD  glimepiride (AMARYL) 1 MG tablet TAKE 1 TABLET BY MOUTH EVERY DAY WITH BREAKFAST 07/07/21   McDonough, Si Gaul, PA-C  HYDROcodone-acetaminophen (NORCO) 5-325 MG tablet Take 1 tablet by mouth every 6 (six) hours as needed for moderate pain. 08/06/21   Hessie Knows, MD  insulin glargine, 1 Unit Dial, (TOUJEO SOLOSTAR) 300 UNIT/ML Solostar Pen INJECT 30 UNITS SUBCUTANEOUSLY IN THE EVENING WITH FOOD 04/06/21   McDonough, Si Gaul, PA-C  metoprolol tartrate (LOPRESSOR) 25 MG tablet TAKE 0.5 TABLETS BY MOUTH 2 TIMES DAILY. 07/07/21   McDonough, Si Gaul, PA-C  mirtazapine (REMERON) 7.5 MG tablet TAKE 1 TABLET BY MOUTH EVERYDAY AT BEDTIME 08/23/21   McDonough, Si Gaul, PA-C  Safety Lancets 28G MISC Use as directed once a daily Dx e11.65 02/04/20   Luiz Ochoa, NP  spironolactone (ALDACTONE) 25 MG tablet TAKE 1 TABLET (25 MG TOTAL) BY MOUTH DAILY. FOR CIRRHOSIS OF LIVER 04/05/21   McDonough, Si Gaul, PA-C  traMADol (ULTRAM) 50 MG tablet Take 50 mg by mouth every 6 (six) hours as needed.  [provider]  traZODone (DESYREL) 50 MG tablet TAKE 1-2 TABS BY MOUTH AT NIGHT BEFORE BED FOR INSOMNIA 09/01/21   McDonough, Si Gaul, PA-C     Family History  Problem Relation Age of Onset   Heart attack Brother 11    Social History   Socioeconomic History   Marital status: Divorced    Spouse name: Not on file   Number of children: Not on file   Years of education: Not on file   Highest education level: Not on file  Occupational History   Not on file  Tobacco Use   Smoking status: Never   Smokeless tobacco: Current    Types: Snuff  Vaping Use   Vaping Use: Never used  Substance and Sexual Activity   Alcohol use: Yes    Alcohol/week: 14.0 standard drinks of alcohol    Types: 14 Cans of beer per week    Comment: twice a week (beer)   Drug use:  No   Sexual activity: Not Currently  Other Topics Concern   Not on file  Social History Narrative   Lives alone   Social Determinants of Health   Financial Resource Strain: Not on file  Food Insecurity: Not on file  Transportation Needs: Not on file  Physical Activity: Not on file  Stress: Not on file  Social Connections: Not on file    ECOG Status: 0 - Asymptomatic  Review of Systems  Review of Systems: A 12 point ROS discussed and pertinent positives are indicated in the HPI above.  All other systems are negative.  Physical Exam No direct physical exam was performed (except for noted visual exam findings with Video Visits).    Vital Signs: There were no vitals taken for this visit.  Imaging: IR Radiologist Eval & Mgmt  Result Date: 11/24/2021 EXAM: NEW PATIENT OFFICE VISIT CHIEF COMPLAINT: SEE EPIC NOTE HISTORY OF PRESENT ILLNESS: SEE EPIC NOTE REVIEW OF SYSTEMS: SEE EPIC NOTE PHYSICAL EXAMINATION: SEE EPIC NOTE ASSESSMENT AND PLAN: SEE EPIC NOTE Electronically Signed   By: Jacqulynn Cadet M.D.   On: 11/24/2021 15:19   MR ABDOMEN WWO CONTRAST  Result Date: 11/17/2021 CLINICAL DATA:  Cirrhosis, follow-up hepatocellular carcinoma EXAM: MRI ABDOMEN WITHOUT AND WITH CONTRAST TECHNIQUE: Multiplanar multisequence MR imaging of the abdomen was performed both before and after the administration of intravenous contrast. CONTRAST:  49m GADAVIST GADOBUTROL 1 MMOL/ML IV SOLN COMPARISON:  06/05/2021 FINDINGS: Lower chest: No acute findings. Hepatobiliary: Coarse, nodular cirrhotic morphology of the liver. Unchanged, small nonenhancing lesion of posterior hepatic segment II measuring 0.9 x 0.6 cm (series 25, image 38). Focal arterial hyperenhancement in the anterior right lobe of the liver, measuring 1.1 x 0.8 cm, previously 1.0 x 0.6 cm (series 21, image 30). No evidence of washout or capsular enhancement. Unchanged atrophic, hyperenhancing appearance of the left lobe of the liver, segments  II and III, consistent with sequelae of local radiation therapy and embolization. Multiple small gallstones. No biliary ductal dilatation. Pancreas: No mass, inflammatory changes, or other parenchymal abnormality identified.No pancreatic ductal dilatation. Spleen: Within normal limits in size and appearance. Unchanged small cyst or hemangioma of the left aspect of the spleen (series 23, image 26). Adrenals/Urinary Tract: Normal adrenal glands. Unchanged small, benign bilateral renal cortical cysts, including small bilateral hemorrhagic or proteinaceous cysts. No further follow-up or characterization is required for these benign cysts no renal masses or suspicious contrast enhancement identified. No evidence of hydronephrosis. Stomach/Bowel: Visualized portions within the abdomen are unremarkable. Vascular/Lymphatic:  No pathologically enlarged lymph nodes identified. No abdominal aortic aneurysm demonstrated. Other:  None. Musculoskeletal: No suspicious osseous lesions identified. IMPRESSION: 1. Unchanged small nonenhancing lesion of posterior hepatic segment II measuring 0.9 x 0.6 cm, consistent with treated hepatocellular carcinoma, without evidence of residual viable tissue. LI-RADS category TR, nonviable. 2. Focus of arterial hyperenhancement in the anterior right lobe of the liver, measuring 1.1 x 0.8 cm. No evidence of washout or capsular enhancement. LI-RADS category 3, intermediate suspicion for hepatocellular carcinoma. Recommend attention on follow-up at 6 months. 3. Cirrhosis. Unchanged atrophic, hyperenhancing appearance of the left lobe of the liver, segments II and III, consistent with sequelae of local radiation therapy and embolization. 4. Cholelithiasis. Electronically Signed   By: Delanna Ahmadi M.D.   On: 11/17/2021 10:36    Labs:  CBC: Recent Labs    07/23/21 1542 11/16/21 1716  WBC 5.2 6.2  HGB 13.3 13.5  HCT 41.0 41.9  PLT 169 181    COAGS: Recent Labs    07/23/21 1605  INR  1.3*    BMP: Recent Labs    07/23/21 1542 11/16/21 1716  NA 135 142  K 3.4* 3.1*  CL 101 105  CO2 23 28  GLUCOSE 159* 95  BUN 12 9  CALCIUM 8.2* 8.3*  CREATININE 1.34* 1.02*  GFRNONAA 43* 60*    LIVER FUNCTION TESTS: Recent Labs    11/16/21 1716  BILITOT 1.5*  AST 29  ALT 13  ALKPHOS 72  PROT 7.9  ALBUMIN 2.9*    TUMOR MARKERS: No results for input(s): "AFPTM", "CEA", "CA199", "CHROMGRNA" in the last 8760 hours.  Assessment and Plan:  Pleasant 66 year old female who continues to do very well 2 years status post radiation segmentectomy's of hepatic segments 2 and 3 for hepatocellular carcinoma.  She has thus far experienced a complete response to therapy with no evidence of residual, recurrent or new disease on follow-up surveillance MRI.  A small arterially enhancing focus in segment 4 is now identified but is indeterminant by imaging.  This warrants close attention on follow-up MRI in 6 months.  1.)  Gadolinium enhanced MRI of the abdomen and clinic visit in 6 months (Feb 2024).     Electronically Signed: Criselda Peaches 11/24/2021, 3:22 PM   I spent a total of  15 Minutes in remote  clinical consultation, greater than 50% of which was counseling/coordinating care for hepatocellular cancer.    Visit type: Audio only (telephone). Audio (no video) only due to patient preference. Alternative for in-person consultation at [facility], [address] [facility], [place], [place]. This visit type was conducted due to national recommendations for restrictions regarding the COVID-19 Pandemic (e.g. social distancing).  This format is felt to be most appropriate for this patient at this time.  All issues noted in this document were discussed and addressed.

## 2021-12-04 ENCOUNTER — Other Ambulatory Visit: Payer: Self-pay

## 2021-12-07 ENCOUNTER — Telehealth: Payer: Self-pay

## 2021-12-07 ENCOUNTER — Other Ambulatory Visit: Payer: Self-pay

## 2021-12-07 DIAGNOSIS — E876 Hypokalemia: Secondary | ICD-10-CM

## 2021-12-07 MED ORDER — CALCIUM CARB-CHOLECALCIFEROL 500-5 MG-MCG PO TABS: ORAL_TABLET | ORAL | 5 refills | Status: DC

## 2021-12-07 MED ORDER — POTASSIUM CHLORIDE ER 8 MEQ PO TBCR: EXTENDED_RELEASE_TABLET | ORAL | 0 refills | Status: DC

## 2021-12-07 MED ORDER — ERGOCALCIFEROL 1.25 MG (50000 UT) PO CAPS: 50000.0000 [IU] | ORAL_CAPSULE | ORAL | 5 refills | Status: DC

## 2021-12-07 NOTE — Telephone Encounter (Signed)
-----   Message from Mylinda Latina, PA-C sent at 12/04/2021  1:27 PM EDT ----- Please let her know that her vit D is low and send drisdol, folate registering as a little low on labs however ref range is varied and level is ok, potassium and calcium low and needs to supplement. Iron is elevated and can decrease any iron supplement

## 2021-12-07 NOTE — Telephone Encounter (Signed)
Pt caregiver annette [phone] advised her that her vitamin D is low we send drisdol also calcium and potassium is low we send pres and also repeat labs in 5 weeks

## 2021-12-14 ENCOUNTER — Telehealth: Payer: Self-pay | Admitting: *Deleted

## 2021-12-14 NOTE — Patient Outreach (Signed)
  Care Coordination   12/14/2021 Name: AMBRY DIX MRN: [id number] DOB: 12-18-1955   Care Coordination Outreach Attempts:  An unsuccessful telephone outreach was attempted today to offer the patient information about available care coordination services as a benefit of their health plan.   Follow Up Plan:  Additional outreach attempts will be made to offer the patient care coordination information and services.   Encounter Outcome:  No Answer  Care Coordination Interventions Activated:  No   Care Coordination Interventions:  No, not indicated    Tonianne Fine, [place] Worker  [facility] Care Management [phone]

## 2021-12-16 ENCOUNTER — Other Ambulatory Visit: Payer: Self-pay | Admitting: Physician Assistant

## 2022-01-04 ENCOUNTER — Other Ambulatory Visit: Payer: Medicare Other | Admitting: Primary Care

## 2022-01-04 ENCOUNTER — Other Ambulatory Visit: Payer: Self-pay | Admitting: Physician Assistant

## 2022-01-04 DIAGNOSIS — E119 Type 2 diabetes mellitus without complications: Secondary | ICD-10-CM

## 2022-01-04 DIAGNOSIS — K7031 Alcoholic cirrhosis of liver with ascites: Secondary | ICD-10-CM

## 2022-01-04 DIAGNOSIS — R531 Weakness: Secondary | ICD-10-CM | POA: Diagnosis not present

## 2022-01-04 DIAGNOSIS — C220 Liver cell carcinoma: Secondary | ICD-10-CM

## 2022-01-04 DIAGNOSIS — Z515 Encounter for palliative care: Secondary | ICD-10-CM | POA: Diagnosis not present

## 2022-01-04 NOTE — Progress Notes (Signed)
[facility] Palliative Care Consult Note Telephone: [phone]  Fax: [phone]    Date of encounter: 01/04/22 2:26 PM PATIENT NAME: Adriana Miller [address] [place] [address]   [phone] (home)  DOB: July 08, 1955 MRN: [id number] PRIMARY CARE PROVIDER:    Carolynne Miller,  [place] [address] [phone]  REFERRING PROVIDER:   Carolynne Miller [place],  [place] [address] [phone]  RESPONSIBLE PARTY:    Contact Information     Name Relation Home Work Mobile   Adriana Miller Brother [phone]  [phone]       I met face to face with patient and family in  home. Palliative Care was asked to follow this patient by consultation request of  McDonough, Si Gaul, PA* to address advance care planning and complex medical decision making. This is a follow up visit.                                   ASSESSMENT AND PLAN / RECOMMENDATIONS:   Advance Care Planning/Goals of Care: Goals include to maximize quality of life and symptom management. Patient/health care surrogate gave his/her permission to discuss.Our advance care planning conversation included a discussion about:    Exploration of personal, cultural or spiritual beliefs that might influence medical decisions  Has granddaughter in [place], has not seen in a while Exploration of goals of care in the event of a sudden injury or illness  Identification of a healthcare agent -brother CODE STATUS: DNR  Symptom Management/Plan:  Diabetes: has [place] free style, but does not have it on. Does not know how to put it on, instructed to take to pharmacy for education. H1C is 5.8, down from over 11 % a year ago.   Sleep: Has been out in the afternoon and slept better. Often sleeps during the day, and we discussed sleep hygiene. Discussed cutting down on trazodone for day time sleepiness. Recommend d/c mirtazipine.  Liver function: recent  scans and labs for patient reviewed.  Mobility: has walker, does not sit on coach as she can't get up. No falls  MOOD:  denies pain, nausea, vomiting and sleeping more.  Endorses not having motivation but not willing to make many changes. On lexapro, could increase if needed.  ALDs: has CAP aide who comes daily. Has help with alds from aide and brother.  Follow up Palliative Care Visit: Palliative care will continue to follow for complex medical decision making, advance care planning, and clarification of goals. Return 12-16 weeks or prn.  This visit was coded based on medical decision making (MDM).  PPS: 50%  HOSPICE ELIGIBILITY/DIAGNOSIS: TBD  Chief Complaint: immobility  HISTORY OF PRESENT ILLNESS:  Adriana Miller is a 66 y.o. year old female  with DM, hepatitis, immobility . Patient seen today to review palliative care needs to include medical decision making and advance care planning as appropriate.   History obtained from review of EMR, discussion with primary team, and interview with family, facility staff/caregiver and/or Adriana Miller.  I reviewed available labs, medications, imaging, studies and related documents from the EMR.  Records reviewed and summarized above.   ROS  General: NAD ENMT: denies dysphagia Cardiovascular: denies chest pain, denies DOE Pulmonary: denies cough, denies increased SOB Abdomen: endorses good appetite, denies constipation, endorses continence of bowel GU: denies dysuria, endorses continence of urine MSK:  denies  increased weakness,  no falls reported Skin: denies rashes or wounds Neurological: denies pain, denies insomnia Psych: Endorses positive mood  Physical Exam: Current and past weights:stable Constitutional: NAD General: frail appearing, wnwd EYES: anicteric sclera, lids intact, no discharge  ENMT: intact hearing, oral mucous membranes moist, dentition missing CV: no LE edema Pulmonary:  no increased work of breathing, no cough,  room air Abdomen: intake 100%, no ascites MSK: mild  sarcopenia, moves all extremities, ambulatory in home, has walker  Skin: warm and dry, no rashes or wounds on visible skin Neuro:  no new generalized weakness,  + cognitive impairment, non-anxious affect   Thank you for the opportunity to participate in the care of Adriana Miller.  The palliative care team will continue to follow. Please call our office at [phone] if we can be of additional assistance.   Adriana McKelvey Smith, NP DNP, AGPCNP-BC  COVID-19 PATIENT SCREENING TOOL Asked and negative response unless otherwise noted:   Have you had symptoms of covid, tested positive or been in contact with someone with symptoms/positive test in the past 5-10 days?   

## 2022-01-19 ENCOUNTER — Telehealth: Payer: Self-pay

## 2022-01-19 NOTE — Telephone Encounter (Signed)
Adriana Miller [phone]) with [facility] services called and was asking about [place] calling and doing a reminder for patients appt before it is on 02/08/22 and I advised that our office usually does a reminder call a feww days before.  I also informed Anne Ng that pt has a lab order to be done before her appt and that she can go to any [facility] and they should be able to pull order up in Epic.  She understood.

## 2022-01-20 ENCOUNTER — Other Ambulatory Visit: Payer: Self-pay

## 2022-01-20 DIAGNOSIS — G4700 Insomnia, unspecified: Secondary | ICD-10-CM

## 2022-01-20 DIAGNOSIS — K7031 Alcoholic cirrhosis of liver with ascites: Secondary | ICD-10-CM

## 2022-01-20 DIAGNOSIS — M10222 Drug-induced gout, left elbow: Secondary | ICD-10-CM

## 2022-01-20 MED ORDER — METOPROLOL TARTRATE 25 MG PO TABS: ORAL_TABLET | ORAL | 3 refills | Status: DC

## 2022-01-20 MED ORDER — TRAZODONE HCL 50 MG PO TABS: ORAL_TABLET | ORAL | 1 refills | Status: DC

## 2022-01-20 MED ORDER — ALLOPURINOL 100 MG PO TABS: 100.0000 mg | ORAL_TABLET | Freq: Every day | ORAL | 1 refills | Status: DC

## 2022-01-20 MED ORDER — SPIRONOLACTONE 25 MG PO TABS: 25.0000 mg | ORAL_TABLET | Freq: Every day | ORAL | 2 refills | Status: DC

## 2022-01-20 NOTE — Telephone Encounter (Signed)
As per dr Humphrey Rolls ok to send trazodone

## 2022-01-21 ENCOUNTER — Other Ambulatory Visit: Payer: Self-pay

## 2022-01-21 DIAGNOSIS — E1165 Type 2 diabetes mellitus with hyperglycemia: Secondary | ICD-10-CM

## 2022-01-21 DIAGNOSIS — E1129 Type 2 diabetes mellitus with other diabetic kidney complication: Secondary | ICD-10-CM

## 2022-01-21 DIAGNOSIS — F320 Major depressive disorder, single episode, mild: Secondary | ICD-10-CM

## 2022-01-21 DIAGNOSIS — G2581 Restless legs syndrome: Secondary | ICD-10-CM

## 2022-01-21 MED ORDER — TOUJEO SOLOSTAR 300 UNIT/ML ~~LOC~~ SOPN: PEN_INJECTOR | SUBCUTANEOUS | 3 refills | Status: DC

## 2022-01-21 MED ORDER — MIRTAZAPINE 7.5 MG PO TABS: ORAL_TABLET | ORAL | 1 refills | Status: DC

## 2022-01-21 MED ORDER — ESCITALOPRAM OXALATE 10 MG PO TABS: 10.0000 mg | ORAL_TABLET | Freq: Every day | ORAL | 2 refills | Status: DC

## 2022-01-21 MED ORDER — GLIMEPIRIDE 1 MG PO TABS: ORAL_TABLET | ORAL | 3 refills | Status: DC

## 2022-02-06 ENCOUNTER — Other Ambulatory Visit: Payer: Self-pay | Admitting: Internal Medicine

## 2022-02-08 ENCOUNTER — Ambulatory Visit: Payer: Medicare Other | Admitting: Physician Assistant

## 2022-02-15 ENCOUNTER — Other Ambulatory Visit: Payer: Medicare Other

## 2022-02-15 VITALS — BP 118/64 | HR 80 | Temp 97.3°F | Wt 173.2 lb

## 2022-02-15 DIAGNOSIS — Z515 Encounter for palliative care: Secondary | ICD-10-CM

## 2022-02-15 NOTE — Progress Notes (Signed)
PATIENT NAME: Adriana Miller DOB: Sep 03, 1955 MRN: [id number]  PRIMARY CARE PROVIDER: Mylinda Latina, PA-C  RESPONSIBLE PARTY:  Acct ID - Guarantor Home Phone Work Phone Relationship Acct Type  [id number] KORTNY, [id number]  Self P/F     [name], [name] [address]    Diabetes:  Dexcom G7 ordered in July.  Patient was uncertain how to apply sensor.  Assisted patient with application of sensor and set of monitoring device. Education provided on use and how often sensor needs to be changed.  Blood sugar 181 on this visit.  Uncertain if patient will be able to change sensor on her own but she will ask her aid to help.  Functional Status:  Patient denies any falls.  Has a rollator walker she uses routinely.  Aide supervises for safety with bathing otherwise patient is independent with bathing, dressing and grooming.  Weight loss:  Weight obtained today.  10 lb weight loss noted since July.  Patient states she eats well but is eating only meals of wheels daily.  States she splits her meal between breakfast and dinner.  Skipping lunch.  Drinking a supplement drink 1-2 x weekly.   Reports snacking throughout the day. Occasionally, aide will get her fast food. Adriana Bathe, NP updated on weight loss.  HISTORY OF PRESENT ILLNESS:  66 year old female with hepatocellular carcinoma.  CODE STATUS: DNR ADVANCED DIRECTIVES: No MOST FORM: Yes PPS: 50%   PHYSICAL EXAM:   VITALS: Today's Vitals   02/15/22 1123  BP: 118/64  Pulse: 80  Temp: (!) 97.3 F (36.3 C)  SpO2: 99%  Weight: 173 lb 3.2 oz (78.6 kg)  PainSc: 0-No pain    LUNGS: clear to auscultation  CARDIAC: Cor RRR}  EXTREMITIES: - for edema SKIN: Skin color, texture, turgor normal. No rashes or lesions or normal  NEURO: positive for gait problems       Adriana Burton, RN

## 2022-03-07 ENCOUNTER — Other Ambulatory Visit: Payer: Self-pay | Admitting: Physician Assistant

## 2022-03-08 NOTE — Telephone Encounter (Signed)
Can you review and see that pt still on this med she no show last appt

## 2022-03-16 ENCOUNTER — Other Ambulatory Visit: Payer: Self-pay | Admitting: Physician Assistant

## 2022-03-16 DIAGNOSIS — F320 Major depressive disorder, single episode, mild: Secondary | ICD-10-CM

## 2022-03-16 NOTE — Telephone Encounter (Signed)
Pt need appt for refills  ?

## 2022-04-12 ENCOUNTER — Other Ambulatory Visit: Payer: Self-pay | Admitting: Physician Assistant

## 2022-04-12 DIAGNOSIS — M10222 Drug-induced gout, left elbow: Secondary | ICD-10-CM

## 2022-04-15 ENCOUNTER — Other Ambulatory Visit: Payer: Self-pay

## 2022-04-15 MED ORDER — BD SWAB SINGLE USE REGULAR PADS: MEDICATED_PAD | 0 refills | Status: DC

## 2022-05-03 ENCOUNTER — Other Ambulatory Visit: Payer: Self-pay | Admitting: Internal Medicine

## 2022-05-03 ENCOUNTER — Ambulatory Visit: Payer: Medicare Other | Admitting: Physician Assistant

## 2022-05-03 NOTE — Telephone Encounter (Signed)
Please schedule overdue F/U appt for 90 day refills. Thank you!

## 2022-05-07 ENCOUNTER — Other Ambulatory Visit: Payer: Self-pay | Admitting: Interventional Radiology

## 2022-05-07 ENCOUNTER — Encounter: Payer: Self-pay | Admitting: Interventional Radiology

## 2022-05-07 DIAGNOSIS — C220 Liver cell carcinoma: Secondary | ICD-10-CM

## 2022-05-10 ENCOUNTER — Other Ambulatory Visit: Payer: 59

## 2022-05-10 DIAGNOSIS — Z515 Encounter for palliative care: Secondary | ICD-10-CM

## 2022-05-10 NOTE — Progress Notes (Signed)
PATIENT NAME: Adriana Miller DOB: [birth year] MRN: [id number]  PRIMARY CARE PROVIDER: Mylinda Latina, PA-C  RESPONSIBLE PARTY:  Acct ID - Guarantor Home Phone Work Phone Relationship Acct Type  [id number] [name], [name]  Self P/F     [name], [place] [address]    I connected with  [name] on 05/10/22 by telephone and verified that I am speaking with the correct person using two identifiers.   I discussed the limitations of evaluation and management by telemedicine. The patient expressed understanding and agreed to proceed.   Connected with patient to schedule a follow up visit and check on overall status.  Patient advised she is doing well.  Continues to have an aide assisting her in the afternoons. She endorses "eating enough to stay alive" but is uncertain if she has any weight loss. Patient will have several appointments over the next week and deferred a home visit to February.   Visit scheduled with patient for February 7 @ [place] am.    [name], RN

## 2022-05-11 ENCOUNTER — Other Ambulatory Visit: Payer: Self-pay | Admitting: Interventional Radiology

## 2022-05-11 DIAGNOSIS — C220 Liver cell carcinoma: Secondary | ICD-10-CM

## 2022-05-11 NOTE — Progress Notes (Signed)
Cardiology Office Note    Date:  05/14/2022   ID:  Adriana Miller, DOB [birth year], MRN [id number]  PCP:  Mylinda Latina, PA-C  Cardiologist:  Nelva Bush, MD  Electrophysiologist:  None   Chief Complaint: Follow-up  History of Present Illness:   Adriana Miller is a 67 y.o. female with history of PAF and flutter on apixaban, DM2, CKD stage II, HTN, HLD, cirrhosis complicated by GI bleed secondary to erosive gastropathy in 01/2019 and hepatocellular carcinoma, seizure disorder, and cerebral aneurysm status post repair who presents for follow-up of A-fib.  She was diagnosed with new onset A-fib in 09/2018 and referred to the ED where she was placed on diltiazem and apixaban.  Plan was to pursue TEE guided DCCV upon establishing with cardiology in 12/2018.  However, she spontaneously converted to sinus rhythm.  Echo from 12/2018 showed an EF of 55 to 60%, normal RV systolic function and ventricular cavity size, trivial tricuspid regurgitation, and an estimated right atrial pressure of 3 mmHg.  She was diagnosed with atrial flutter in 05/2019 with subsequent spontaneous conversion noted at follow-up in 06/2019.  She was noted to be back in A-fib with RVR in 07/2019 and transferred to the ED where she was hydrated and administered IV diltiazem for rate control with outpatient follow-up.  In follow-up in 10/2019, she had converted to sinus bradycardia, with sinus rhythm noted at cardiology visits since.  She was last seen in the office in 10/2021 and was without symptoms of angina or decompensation.  Her only complaint at that time was continued weakness in the right hand following ORIF in 07/2021 by orthopedics.  She had not been evaluated by them since 08/2021 and was not doing any physical therapy.  She was maintaining sinus rhythm.  She comes in doing well from a cardiac perspective and is without symptoms of angina or decompensation.  No dyspnea, dizziness, palpitations, presyncope, or syncope.   No falls, hematochezia, or melena.  No lower extremity swelling or progressive orthopnea.  No abdominal distention.  She is adherent and tolerating cardiac medications without issues.  She does not have any cardiac concerns at this time.   Labs independently reviewed: 10/2021 - TC 134, TG 71, HDL 32, LDL 88, TSH normal, potassium 3.1, BUN 9, serum creatinine 1.02, BUN 2.9, AST/ALT normal, Hgb 13.5, PLT 181, A1c 5.8  Past Medical History:  Diagnosis Date   Alcoholic cirrhosis of liver without ascites ([place])    B12 deficiency 04/05/2017   Blood in stool    Cerebral aneurysm    Chronic kidney disease    Diabetes mellitus without complication ([place])    type 2   Gastric erosion with bleeding    Hepatocellular carcinoma ([place])    Hyperlipidemia    Hypertension    Lower extremity cellulitis 02/08/2019   Paroxysmal A-fib ([facility])    Seizure disorder [facility])     Past Surgical History:  Procedure Laterality Date   CEREBRAL ANEURYSM REPAIR  1991   COLONOSCOPY WITH PROPOFOL N/A 02/12/2019   Procedure: COLONOSCOPY WITH PROPOFOL;  Surgeon: Lin Landsman, MD;  Location: [facility] ENDOSCOPY;  Service: Gastroenterology;  Laterality: N/A;   ESOPHAGOGASTRODUODENOSCOPY (EGD) WITH PROPOFOL N/A 02/12/2019   Procedure: ESOPHAGOGASTRODUODENOSCOPY (EGD) WITH PROPOFOL;  Surgeon: Lin Landsman, MD;  Location: [facility] ENDOSCOPY;  Service: Gastroenterology;  Laterality: N/A;   IR ANGIOGRAM SELECTIVE EACH ADDITIONAL VESSEL  09/21/2019   IR ANGIOGRAM SELECTIVE EACH ADDITIONAL VESSEL  09/21/2019   IR ANGIOGRAM SELECTIVE EACH ADDITIONAL  VESSEL  09/21/2019   IR ANGIOGRAM SELECTIVE EACH ADDITIONAL VESSEL  09/21/2019   IR ANGIOGRAM SELECTIVE EACH ADDITIONAL VESSEL  10/02/2019   IR ANGIOGRAM VISCERAL SELECTIVE  09/21/2019   IR ANGIOGRAM VISCERAL SELECTIVE  09/21/2019   IR ANGIOGRAM VISCERAL SELECTIVE  10/02/2019   IR EMBO TUMOR ORGAN ISCHEMIA INFARCT INC GUIDE ROADMAPPING  10/02/2019   IR EMBO TUMOR ORGAN ISCHEMIA INFARCT INC GUIDE  ROADMAPPING  10/02/2019   IR RADIOLOGIST EVAL & MGMT  08/28/2019   IR RADIOLOGIST EVAL & MGMT  01/30/2020   IR RADIOLOGIST EVAL & MGMT  04/30/2020   IR RADIOLOGIST EVAL & MGMT  07/30/2020   IR RADIOLOGIST EVAL & MGMT  10/28/2020   IR RADIOLOGIST EVAL & MGMT  02/24/2021   IR RADIOLOGIST EVAL & MGMT  06/08/2021   IR RADIOLOGIST EVAL & MGMT  11/24/2021   IR US GUIDE VASC ACCESS LEFT  09/21/2019   IR US GUIDE VASC ACCESS LEFT  10/02/2019   OPEN REDUCTION INTERNAL FIXATION (ORIF) DISTAL RADIAL FRACTURE Right 08/06/2021   Procedure: OPEN REDUCTION INTERNAL FIXATION (ORIF) DISTAL RADIAL FRACTURE;  Surgeon: Hessie Knows, MD;  Location: [facility] ORS;  Service: Orthopedics;  Laterality: Right;    Current Medications: Current Meds  Medication Sig   allopurinol (ZYLOPRIM) 100 MG tablet TAKE 1 TABLET BY MOUTH EVERY DAY   Calcium Carb-Cholecalciferol (CALCIUM 500/D) 500-5 MG-MCG TABS Take 1 tab po daily   Continuous Blood Gluc Sensor (DEXCOM G7 SENSOR) MISC Use as directed for continuous glucose monitoring.  Change sensor every 10 days   E11.65   ELIQUIS 5 MG TABS tablet TAKE 1 TABLET BY MOUTH TWICE A DAY   ergocalciferol (VITAMIN D2) 1.25 MG (50000 UT) capsule Take 1 capsule (50,000 Units total) by mouth once a week.   escitalopram (LEXAPRO) 10 MG tablet TAKE 1 TABLET (10 MG TOTAL) BY MOUTH DAILY. FOR ANXIETY/SLEEP   furosemide (LASIX) 20 MG tablet TAKE 1 TABLET (20 MG TOTAL) BY MOUTH DAILY. NEED APPT FOR MORE REFILLS   glimepiride (AMARYL) 1 MG tablet TAKE 1 TABLET BY MOUTH EVERY DAY WITH BREAKFAST   glucose blood (ACCU-CHEK GUIDE) test strip USE AS INSTRUCTED ONCE A DAILY   HYDROcodone-acetaminophen (NORCO) 5-325 MG tablet Take 1 tablet by mouth every 6 (six) hours as needed for moderate pain.   insulin glargine, 1 Unit Dial, (TOUJEO SOLOSTAR) 300 UNIT/ML Solostar Pen INJECT 30 UNITS SUBCUTANEOUSLY IN THE EVENING WITH FOOD   metoprolol tartrate (LOPRESSOR) 25 MG tablet TAKE 0.5 TABLETS BY MOUTH 2 TIMES DAILY.    mirtazapine (REMERON) 7.5 MG tablet TAKE 1 TABLET BY MOUTH EVERYDAY AT BEDTIME   potassium chloride (KLOR-CON) 8 MEQ tablet TAKE 1 TAB BY MOUTH MONDAY,WEDNESDAY AND FRIDAY   Safety Lancets 28G MISC Use as directed once a daily Dx e11.65   spironolactone (ALDACTONE) 25 MG tablet Take 1 tablet (25 mg total) by mouth daily. For cirrhosis of liver   traMADol (ULTRAM) 50 MG tablet Take 50 mg by mouth every 6 (six) hours as needed.   traZODone (DESYREL) 50 MG tablet Take 1-2 tab po at bedtime for insomnia    Allergies:   Aspirin   Social History   Socioeconomic History   Marital status: Divorced    Spouse name: Not on file   Number of children: Not on file   Years of education: Not on file   Highest education level: Not on file  Occupational History   Not on file  Tobacco Use   Smoking status: Never  Smokeless tobacco: Current    Types: Snuff  Vaping Use   Vaping Use: Never used  Substance and Sexual Activity   Alcohol use: Yes    Alcohol/week: 14.0 standard drinks of alcohol    Types: 14 Cans of beer per week    Comment: twice a week (beer)   Drug use: No   Sexual activity: Not Currently  Other Topics Concern   Not on file  Social History Narrative   Lives alone   Social Determinants of Health   Financial Resource Strain: Not on file  Food Insecurity: Not on file  Transportation Needs: Not on file  Physical Activity: Not on file  Stress: Not on file  Social Connections: Not on file     Family History:  The patient's family history includes Heart attack (age of onset: 20) in her brother.  ROS:   12-point review of systems is negative unless otherwise noted in HPI.   EKGs/Labs/Other Studies Reviewed:    Studies reviewed were summarized above. The additional studies were reviewed today:  2D echo 01/15/2019: 1. Left ventricular ejection fraction, by visual estimation, is 55 to  60%. The left ventricle has normal function. Normal left ventricular size.  There is no  left ventricular hypertrophy.   2. Global right ventricle has normal systolic function.The right  ventricular size is normal. No increase in right ventricular wall  thickness.   3. Left atrial size was normal.   4. Right atrial size was normal.   5. The mitral valve is normal in structure. No evidence of mitral valve  regurgitation. No evidence of mitral stenosis.   6. The tricuspid valve is normal in structure. Tricuspid valve  regurgitation is trivial.   7. The aortic valve is normal in structure. Aortic valve regurgitation  was not visualized by color flow Doppler. Structurally normal aortic  valve, with no evidence of sclerosis or stenosis.   8. The pulmonic valve was normal in structure. Pulmonic valve  regurgitation is trivial by color flow Doppler.   9. TR signal is inadequate for assessing pulmonary artery systolic  pressure.  10. The inferior vena cava is normal in size with greater than 50%  respiratory variability, suggesting right atrial pressure of 3 mmHg.   EKG:  EKG is ordered today.  The EKG ordered today demonstrates NSR, 75 bpm, no acute ST-T changes  Recent Labs: 11/16/2021: ALT 13; BUN 9; Creatinine, Ser 1.02; Hemoglobin 13.5; Platelets 181; Potassium 3.1; Sodium 142; TSH 2.735  Recent Lipid Panel    Component Value Date/Time   CHOL 134 11/16/2021 1716   CHOL 134 01/27/2018 1340   TRIG 71 11/16/2021 1716   HDL 32 (L) 11/16/2021 1716   HDL 80 01/27/2018 1340   CHOLHDL 4.2 11/16/2021 1716   VLDL 14 11/16/2021 1716   LDLCALC 88 11/16/2021 1716   LDLCALC 42 01/27/2018 1340    PHYSICAL EXAM:    VS:  BP 116/62 (BP Location: Left Arm, Patient Position: Sitting, Cuff Size: Normal)   Pulse 75   Ht '5\' 6"'$  (1.676 m)   Wt 175 lb 8 oz (79.6 kg)   SpO2 98%   BMI 28.33 kg/m   BMI: Body mass index is 28.33 kg/m.  Physical Exam Vitals reviewed.  Constitutional:      Appearance: She is well-developed.  HENT:     Head: Normocephalic and atraumatic.  Eyes:      General:        Right eye: No discharge.  Left eye: No discharge.  Neck:     Vascular: No JVD.  Cardiovascular:     Rate and Rhythm: Normal rate and regular rhythm.     Heart sounds: Normal heart sounds, S1 normal and S2 normal. Heart sounds not distant. No midsystolic click and no opening snap. No murmur heard.    No friction rub.  Pulmonary:     Effort: Pulmonary effort is normal. No respiratory distress.     Breath sounds: Normal breath sounds. No decreased breath sounds, wheezing or rales.  Chest:     Chest wall: No tenderness.  Abdominal:     General: There is no distension.     Palpations: Abdomen is soft.     Tenderness: There is no abdominal tenderness.  Musculoskeletal:     Cervical back: Normal range of motion.     Right lower leg: No edema.     Left lower leg: No edema.  Skin:    General: Skin is warm and dry.     Nails: There is no clubbing.  Neurological:     Mental Status: She is alert and oriented to person, place, and time.  Psychiatric:        Speech: Speech normal.        Behavior: Behavior normal.        Thought Content: Thought content normal.        Judgment: Judgment normal.     Wt Readings from Last 3 Encounters:  05/14/22 175 lb 8 oz (79.6 kg)  05/13/22 177 lb (80.3 kg)  02/15/22 173 lb 3.2 oz (78.6 kg)     ASSESSMENT & PLAN:   PAF: Maintaining sinus rhythm on Lopressor 12.5 mg twice daily.  Given a CHA2DS2-VASc of at least 4, she remains on apixaban 5 mg twice daily.  No symptoms concerning for bleeding.  Check CBC and BMP.  HTN: Blood pressure is well-controlled in the office today.  She remains on metoprolol and spironolactone.  Cirrhosis: She is euvolemic.  She remains on furosemide and spironolactone with ongoing follow-up per hepatology.  CKD stage II: Check BMP.  Hypokalemia: She remains on spironolactone and KCl 8 mEq Monday, Wednesday, and Friday.  Check BMP.   Disposition: F/u with Dr. Saunders Revel or an APP in 6  months.   Medication Adjustments/Labs and Tests Ordered: Current medicines are reviewed at length with the patient today.  Concerns regarding medicines are outlined above. Medication changes, Labs and Tests ordered today are summarized above and listed in the Patient Instructions accessible in Encounters.   Signed, Christell Faith, PA-C 05/14/2022 3:34 PM     [facility] [address] [place] [address] [place], [place] [address] [phone]

## 2022-05-13 ENCOUNTER — Encounter: Payer: Self-pay | Admitting: Physician Assistant

## 2022-05-13 ENCOUNTER — Ambulatory Visit (INDEPENDENT_AMBULATORY_CARE_PROVIDER_SITE_OTHER): Payer: 59 | Admitting: Physician Assistant

## 2022-05-13 VITALS — BP 122/62 | HR 77 | Temp 98.4°F | Resp 16 | Ht 66.0 in | Wt 177.0 lb

## 2022-05-13 DIAGNOSIS — E119 Type 2 diabetes mellitus without complications: Secondary | ICD-10-CM | POA: Diagnosis not present

## 2022-05-13 DIAGNOSIS — E538 Deficiency of other specified B group vitamins: Secondary | ICD-10-CM | POA: Diagnosis not present

## 2022-05-13 DIAGNOSIS — Z0001 Encounter for general adult medical examination with abnormal findings: Secondary | ICD-10-CM

## 2022-05-13 DIAGNOSIS — R300 Dysuria: Secondary | ICD-10-CM | POA: Diagnosis not present

## 2022-05-13 DIAGNOSIS — Z794 Long term (current) use of insulin: Secondary | ICD-10-CM | POA: Diagnosis not present

## 2022-05-13 DIAGNOSIS — E559 Vitamin D deficiency, unspecified: Secondary | ICD-10-CM

## 2022-05-13 DIAGNOSIS — E876 Hypokalemia: Secondary | ICD-10-CM

## 2022-05-13 DIAGNOSIS — I10 Essential (primary) hypertension: Secondary | ICD-10-CM

## 2022-05-13 DIAGNOSIS — C220 Liver cell carcinoma: Secondary | ICD-10-CM

## 2022-05-13 DIAGNOSIS — I480 Paroxysmal atrial fibrillation: Secondary | ICD-10-CM

## 2022-05-13 LAB — POCT GLYCOSYLATED HEMOGLOBIN (HGB A1C): Hemoglobin A1C: 7.2 % — AB (ref 4.0–5.6)

## 2022-05-13 MED ORDER — ERGOCALCIFEROL 1.25 MG (50000 UT) PO CAPS: 50000.0000 [IU] | ORAL_CAPSULE | ORAL | 3 refills | Status: DC

## 2022-05-13 MED ORDER — CYANOCOBALAMIN 1000 MCG/ML IJ SOLN
1000.0000 ug | Freq: Once | INTRAMUSCULAR | Status: AC
  Administered 2022-05-13: 1000 ug via INTRAMUSCULAR

## 2022-05-13 NOTE — Progress Notes (Signed)
[facility] [place], [place] [address]  Internal MEDICINE  Office Visit Note  Patient Name: Adriana Miller  [id number]  [id number]  Date of Service: 05/18/2022  Chief Complaint  Patient presents with   Medicare Wellness   Diabetes   Hypertension   Hyperlipidemia     HPI Pt is here for routine health maintenance examination and is doing well today -wearing dexcom sensor now, but unfortunately has difficulty changing this and will have nurse/aide help her -Taking 26 units insulin daily and glimepiride. Will go back up to 28units. Not aware of any events from sensor.  -lost 7lbs since last visit -declines vaccines, also due for mammogram and bone density but declines at this time -needs to schedule eye exam -labs previously reviewed over the phone. Admits she did not refill the drisdol and will resend to continue weekly supplement. Also states she never started on calcium supplement. Is in need to repeat labs to check calcium and potassium and has labs already ordered by another provider -States she has labs and imaging coming up soon  Current Medication: Outpatient Encounter Medications as of 05/13/2022  Medication Sig   Alcohol Swabs (B-D SINGLE USE SWABS REGULAR) PADS Use as directed  twice daily (Patient not taking: Reported on 05/14/2022)   BD PEN NEEDLE NANO 2ND GEN 32G X 4 MM MISC USE AS DIRECTED WITH TOUJEO (Patient not taking: Reported on 05/14/2022)   Blood Glucose Monitoring Suppl (ACCU-CHEK GUIDE ME) w/Device KIT Use as directed  dx E11.65 (Patient not taking: Reported on 05/14/2022)   Calcium Carb-Cholecalciferol (CALCIUM 500/D) 500-5 MG-MCG TABS Take 1 tab po daily   Continuous Blood Gluc Receiver (DEXCOM G7 RECEIVER) DEVI Use for continuous blood glucose monitoring  DX E11.65 (Patient not taking: Reported on 05/14/2022)   Continuous Blood Gluc Sensor (DEXCOM G7 SENSOR) MISC Use as directed for continuous glucose monitoring.  Change sensor every  10 days   E11.65   escitalopram (LEXAPRO) 10 MG tablet TAKE 1 TABLET (10 MG TOTAL) BY MOUTH DAILY. FOR ANXIETY/SLEEP   furosemide (LASIX) 20 MG tablet TAKE 1 TABLET (20 MG TOTAL) BY MOUTH DAILY. NEED APPT FOR MORE REFILLS   glimepiride (AMARYL) 1 MG tablet TAKE 1 TABLET BY MOUTH EVERY DAY WITH BREAKFAST   glucose blood (ACCU-CHEK GUIDE) test strip USE AS INSTRUCTED ONCE A DAILY   HYDROcodone-acetaminophen (NORCO) 5-325 MG tablet Take 1 tablet by mouth every 6 (six) hours as needed for moderate pain.   insulin glargine, 1 Unit Dial, (TOUJEO SOLOSTAR) 300 UNIT/ML Solostar Pen INJECT 30 UNITS SUBCUTANEOUSLY IN THE EVENING WITH FOOD   metoprolol tartrate (LOPRESSOR) 25 MG tablet TAKE 0.5 TABLETS BY MOUTH 2 TIMES DAILY.   mirtazapine (REMERON) 7.5 MG tablet TAKE 1 TABLET BY MOUTH EVERYDAY AT BEDTIME   potassium chloride (KLOR-CON) 8 MEQ tablet TAKE 1 TAB BY MOUTH MONDAY,WEDNESDAY AND FRIDAY   Safety Lancets 28G MISC Use as directed once a daily Dx e11.65   spironolactone (ALDACTONE) 25 MG tablet Take 1 tablet (25 mg total) by mouth daily. For cirrhosis of liver   traMADol (ULTRAM) 50 MG tablet Take 50 mg by mouth every 6 (six) hours as needed.   traZODone (DESYREL) 50 MG tablet Take 1-2 tab po at bedtime for insomnia   [DISCONTINUED] allopurinol (ZYLOPRIM) 100 MG tablet TAKE 1 TABLET BY MOUTH EVERY DAY   [DISCONTINUED] apixaban (ELIQUIS) 5 MG TABS tablet TAKE 1 TABLET BY MOUTH TWICE A DAY   [DISCONTINUED] ergocalciferol (VITAMIN D2) 1.25 MG (  50000 UT) capsule Take 1 capsule (50,000 Units total) by mouth once a week.   ergocalciferol (VITAMIN D2) 1.25 MG (50000 UT) capsule Take 1 capsule (50,000 Units total) by mouth once a week.   [EXPIRED] cyanocobalamin (VITAMIN B12) injection 1,000 mcg    No facility-administered encounter medications on file as of 05/13/2022.    Surgical History: Past Surgical History:  Procedure Laterality Date   CEREBRAL ANEURYSM REPAIR  1991   COLONOSCOPY WITH PROPOFOL  N/A 02/12/2019   Procedure: COLONOSCOPY WITH PROPOFOL;  Surgeon: Lin Landsman, MD;  Location: [facility] ENDOSCOPY;  Service: Gastroenterology;  Laterality: N/A;   ESOPHAGOGASTRODUODENOSCOPY (EGD) WITH PROPOFOL N/A 02/12/2019   Procedure: ESOPHAGOGASTRODUODENOSCOPY (EGD) WITH PROPOFOL;  Surgeon: Lin Landsman, MD;  Location: [facility] ENDOSCOPY;  Service: Gastroenterology;  Laterality: N/A;   IR ANGIOGRAM SELECTIVE EACH ADDITIONAL VESSEL  09/21/2019   IR ANGIOGRAM SELECTIVE EACH ADDITIONAL VESSEL  09/21/2019   IR ANGIOGRAM SELECTIVE EACH ADDITIONAL VESSEL  09/21/2019   IR ANGIOGRAM SELECTIVE EACH ADDITIONAL VESSEL  09/21/2019   IR ANGIOGRAM SELECTIVE EACH ADDITIONAL VESSEL  10/02/2019   IR ANGIOGRAM VISCERAL SELECTIVE  09/21/2019   IR ANGIOGRAM VISCERAL SELECTIVE  09/21/2019   IR ANGIOGRAM VISCERAL SELECTIVE  10/02/2019   IR EMBO TUMOR ORGAN ISCHEMIA INFARCT INC GUIDE ROADMAPPING  10/02/2019   IR EMBO TUMOR ORGAN ISCHEMIA INFARCT INC GUIDE ROADMAPPING  10/02/2019   IR RADIOLOGIST EVAL & MGMT  08/28/2019   IR RADIOLOGIST EVAL & MGMT  01/30/2020   IR RADIOLOGIST EVAL & MGMT  04/30/2020   IR RADIOLOGIST EVAL & MGMT  07/30/2020   IR RADIOLOGIST EVAL & MGMT  10/28/2020   IR RADIOLOGIST EVAL & MGMT  02/24/2021   IR RADIOLOGIST EVAL & MGMT  06/08/2021   IR RADIOLOGIST EVAL & MGMT  11/24/2021   IR US GUIDE VASC ACCESS LEFT  09/21/2019   IR US GUIDE VASC ACCESS LEFT  10/02/2019   OPEN REDUCTION INTERNAL FIXATION (ORIF) DISTAL RADIAL FRACTURE Right 08/06/2021   Procedure: OPEN REDUCTION INTERNAL FIXATION (ORIF) DISTAL RADIAL FRACTURE;  Surgeon: Hessie Knows, MD;  Location: [facility] ORS;  Service: Orthopedics;  Laterality: Right;    Medical History: Past Medical History:  Diagnosis Date   Alcoholic cirrhosis of liver without ascites ([place])    B12 deficiency 04/05/2017   Blood in stool    Cerebral aneurysm    Chronic kidney disease    Diabetes mellitus without complication (HCC)    type 2   Gastric erosion with bleeding     Hepatocellular carcinoma ([place])    Hyperlipidemia    Hypertension    Lower extremity cellulitis 02/08/2019   Paroxysmal A-fib (HCC)    Seizure disorder (HCC)     Family History: Family History  Problem Relation Age of Onset   Heart attack Brother 64      Review of Systems  Constitutional:  Negative for chills and unexpected weight change.  HENT:  Negative for congestion, rhinorrhea, sneezing and sore throat.   Eyes:  Negative for redness.  Respiratory:  Negative for cough, chest tightness and shortness of breath.   Cardiovascular:  Negative for chest pain and palpitations.  Gastrointestinal:  Negative for abdominal pain, constipation, diarrhea, nausea and vomiting.  Genitourinary:  Negative for dysuria and frequency.  Musculoskeletal:  Positive for arthralgias. Negative for back pain, joint swelling and neck pain.  Skin:  Negative for rash.  Neurological: Negative.  Negative for tremors and numbness.  Hematological:  Negative for adenopathy. Does not bruise/bleed easily.  Psychiatric/Behavioral:  Negative for behavioral problems (Depression), sleep disturbance and suicidal ideas. The patient is not nervous/anxious.      Vital Signs: BP 122/62 Comment: 151/96  Pulse 77   Temp 98.4 F (36.9 C)   Resp 16   Ht '5\' 6"'$  (1.676 m)   Wt 177 lb (80.3 kg)   SpO2 99%   BMI 28.57 kg/m    Physical Exam Constitutional:      General: She is not in acute distress.    Appearance: She is well-developed. She is not diaphoretic.  HENT:     Head: Normocephalic and atraumatic.     Mouth/Throat:     Pharynx: No oropharyngeal exudate.  Eyes:     Pupils: Pupils are equal, round, and reactive to light.  Neck:     Thyroid: No thyromegaly.     Vascular: No JVD.     Trachea: No tracheal deviation.  Cardiovascular:     Rate and Rhythm: Normal rate and regular rhythm.     Heart sounds: Normal heart sounds. No murmur heard.    No friction rub. No gallop.  Pulmonary:     Effort:  Pulmonary effort is normal. No respiratory distress.     Breath sounds: No wheezing or rales.  Chest:     Chest wall: No tenderness.  Abdominal:     General: Bowel sounds are normal.     Palpations: Abdomen is soft.     Tenderness: There is no abdominal tenderness.  Musculoskeletal:        General: Normal range of motion.     Cervical back: Normal range of motion and neck supple.  Lymphadenopathy:     Cervical: No cervical adenopathy.  Skin:    General: Skin is warm and dry.  Neurological:     Mental Status: She is alert and oriented to person, place, and time.     Cranial Nerves: No cranial nerve deficit.     Comments: Utilizes cane for mobility  Psychiatric:        Behavior: Behavior normal.        Thought Content: Thought content normal.        Judgment: Judgment normal.      LABS: Recent Results (from the past 2160 hour(s))  POCT HgB A1C     Status: Abnormal   Collection Time: 05/13/22  3:15 PM  Result Value Ref Range   Hemoglobin A1C 7.2 (A) 4.0 - 5.6 %   HbA1c POC (<> result, manual entry)     HbA1c, POC (prediabetic range)     HbA1c, POC (controlled diabetic range)    CBC     Status: None   Collection Time: 05/14/22  3:55 PM  Result Value Ref Range   WBC 8.8 4.0 - 10.5 K/uL   RBC 4.22 3.87 - 5.11 MIL/uL   Hemoglobin 13.4 12.0 - 15.0 g/dL   HCT 41.2 36.0 - 46.0 %   MCV 97.6 80.0 - 100.0 fL   MCH 31.8 26.0 - 34.0 pg   MCHC 32.5 30.0 - 36.0 g/dL   RDW 13.4 11.5 - 15.5 %   Platelets 181 150 - 400 K/uL   nRBC 0.0 0.0 - 0.2 %    Comment: Performed at [facility], [address], [place], [place] [address]  Basic Metabolic Panel (BMET)     Status: Abnormal   Collection Time: 05/14/22  3:55 PM  Result Value Ref Range   Sodium 138 135 - 145 mmol/L   Potassium 3.0 (L) 3.5 -  5.1 mmol/L   Chloride 99 98 - 111 mmol/L   CO2 27 22 - 32 mmol/L   Glucose, Bld 170 (H) 70 - 99 mg/dL    Comment: Glucose reference range applies only to samples taken after  fasting for at least 8 hours.   BUN 9 8 - 23 mg/dL   Creatinine, Ser 1.21 (H) 0.44 - 1.00 mg/dL   Calcium 8.5 (L) 8.9 - 10.3 mg/dL   GFR, Estimated 49 (L) >60 mL/min    Comment: (NOTE) Calculated using the CKD-EPI Creatinine Equation (2021)    Anion gap 12 5 - 15    Comment: Performed at [facility], [address] [place],  [address]        Assessment/Plan: 1. Encounter for general adult medical examination with abnormal findings CPE performed, declines vaccines and mammogram, due for eye exam  2. Type 2 diabetes mellitus without complication, with long-term current use of insulin (HCC) - POCT HgB A1C is 7.2 which is increased from 5.8 last visit, will go back up to 28units of insulin and continue glimepiride and monitor closely - Urine Microalbumin w/creat. ratio  3. Essential hypertension Stable, continue current medications  4. Paroxysmal atrial fibrillation ([place]) Followed by cardiology  5. Hepatocellular carcinoma ([place]) Followed by palliative care  6. Hypokalemia Has labs already ordered by another provider which will recheck this. May need to continue potassium supplement along with calcium supplement  7. Vitamin D deficiency - ergocalciferol (VITAMIN D2) 1.25 MG (50000 UT) capsule; Take 1 capsule (50,000 Units total) by mouth once a week.  Dispense: 12 capsule; Refill: 3  8. B12 deficiency - cyanocobalamin (VITAMIN B12) injection 1,000 mcg  9. Dysuria - UA/M w/rflx Culture, Routine   General Counseling: Rhythm verbalizes understanding of the findings of todays visit and agrees with plan of treatment. I have discussed any further diagnostic evaluation that may be needed or ordered today. We also reviewed her medications today. she has been encouraged to call the office with any questions or concerns that should arise related to todays visit.    Counseling:    Orders Placed This Encounter  Procedures   UA/M w/rflx Culture, Routine    Urine Microalbumin w/creat. ratio   POCT HgB A1C    Meds ordered this encounter  Medications   cyanocobalamin (VITAMIN B12) injection 1,000 mcg   ergocalciferol (VITAMIN D2) 1.25 MG (50000 UT) capsule    Sig: Take 1 capsule (50,000 Units total) by mouth once a week.    Dispense:  12 capsule    Refill:  3    This patient was seen by Drema Dallas, PA-C in collaboration with Dr. Clayborn Bigness as a part of collaborative care agreement.  Total time spent:35 Minutes  Time spent includes review of chart, medications, test results, and follow up plan with the patient.     Lavera Guise, MD  Internal Medicine

## 2022-05-14 ENCOUNTER — Encounter: Payer: Self-pay | Admitting: Physician Assistant

## 2022-05-14 ENCOUNTER — Ambulatory Visit: Payer: 59 | Attending: Physician Assistant | Admitting: Physician Assistant

## 2022-05-14 ENCOUNTER — Other Ambulatory Visit: Payer: Self-pay | Admitting: Internal Medicine

## 2022-05-14 ENCOUNTER — Other Ambulatory Visit
Admission: RE | Admit: 2022-05-14 | Discharge: 2022-05-14 | Disposition: A | Discharge status: Home / Self Care | Payer: 59 | Source: Ambulatory Visit | Attending: Physician Assistant | Admitting: Physician Assistant

## 2022-05-14 VITALS — BP 116/62 | HR 75 | Ht 66.0 in | Wt 175.5 lb

## 2022-05-14 DIAGNOSIS — I480 Paroxysmal atrial fibrillation: Secondary | ICD-10-CM | POA: Diagnosis not present

## 2022-05-14 DIAGNOSIS — K7460 Unspecified cirrhosis of liver: Secondary | ICD-10-CM | POA: Diagnosis not present

## 2022-05-14 DIAGNOSIS — E876 Hypokalemia: Secondary | ICD-10-CM

## 2022-05-14 DIAGNOSIS — Z79899 Other long term (current) drug therapy: Secondary | ICD-10-CM | POA: Diagnosis not present

## 2022-05-14 DIAGNOSIS — N182 Chronic kidney disease, stage 2 (mild): Secondary | ICD-10-CM

## 2022-05-14 DIAGNOSIS — E785 Hyperlipidemia, unspecified: Secondary | ICD-10-CM

## 2022-05-14 DIAGNOSIS — I10 Essential (primary) hypertension: Secondary | ICD-10-CM

## 2022-05-14 LAB — CBC
HCT: 41.2 % (ref 36.0–46.0)
Hemoglobin: 13.4 g/dL (ref 12.0–15.0)
MCH: 31.8 pg (ref 26.0–34.0)
MCHC: 32.5 g/dL (ref 30.0–36.0)
MCV: 97.6 fL (ref 80.0–100.0)
Platelets: 181 10*3/uL (ref 150–400)
RBC: 4.22 MIL/uL (ref 3.87–5.11)
RDW: 13.4 % (ref 11.5–15.5)
WBC: 8.8 10*3/uL (ref 4.0–10.5)
nRBC: 0 % (ref 0.0–0.2)

## 2022-05-14 LAB — BASIC METABOLIC PANEL
Anion gap: 12 (ref 5–15)
BUN: 9 mg/dL (ref 8–23)
CO2: 27 mmol/L (ref 22–32)
Calcium: 8.5 mg/dL — ABNORMAL LOW (ref 8.9–10.3)
Chloride: 99 mmol/L (ref 98–111)
Creatinine, Ser: 1.21 mg/dL — ABNORMAL HIGH (ref 0.44–1.00)
GFR, Estimated: 49 mL/min — ABNORMAL LOW (ref >60)
Glucose, Bld: 170 mg/dL — ABNORMAL HIGH (ref 70–99)
Potassium: 3 mmol/L — ABNORMAL LOW (ref 3.5–5.1)
Sodium: 138 mmol/L (ref 135–145)

## 2022-05-14 NOTE — Telephone Encounter (Signed)
Refill Request.

## 2022-05-14 NOTE — Patient Instructions (Signed)
Medication Instructions:  Your physician recommends that you continue on your current medications as directed. Please refer to the Current Medication list given to you today.  *If you need a refill on your cardiac medications before your next appointment, please call your pharmacy*   Lab Work: BMP and CBC today - Please go to the [facility]. You will check in at the front desk to the right as you walk into the atrium. Valet Parking is offered if needed. - No appointment needed. You may go any day between 7 am and 6 pm.  If you have labs (blood work) drawn today and your tests are completely normal, you will receive your results only by: [place] (if you have MyChart) OR A paper copy in the mail If you have any lab test that is abnormal or we need to change your treatment, we will call you to review the results.   Testing/Procedures: No testing ordered  Follow-Up: At [facility], you and your health needs are our priority.  As part of our continuing mission to provide you with exceptional heart care, we have created designated Provider Care Teams.  These Care Teams include your primary Cardiologist (physician) and Advanced Practice Providers (APPs -  Physician Assistants and Nurse Practitioners) who all work together to provide you with the care you need, when you need it.  We recommend signing up for the patient portal called "MyChart".  Sign up information is provided on this After Visit Summary.  MyChart is used to connect with patients for Virtual Visits (Telemedicine).  Patients are able to view lab/test results, encounter notes, upcoming appointments, etc.  Non-urgent messages can be sent to your provider as well.   To learn more about what you can do with MyChart, go to [email or web address].    Your next appointment:   6 month(s)  Provider:   Christell Faith, PA-C

## 2022-05-14 NOTE — Telephone Encounter (Signed)
Prescription refill request for Eliquis received. Indication: Afib  Last office visit: 11/05/21 (End)  Scr: 1.02 (11/16/21)  Age: 67 Weight: 80.3kg  Appropriate dose and refill sent to requested pharmacy.

## 2022-05-17 ENCOUNTER — Other Ambulatory Visit: Payer: Self-pay | Admitting: Physician Assistant

## 2022-05-17 ENCOUNTER — Ambulatory Visit
Admission: RE | Admit: 2022-05-17 | Discharge: 2022-05-17 | Disposition: A | Discharge status: Home / Self Care | Payer: 59 | Source: Ambulatory Visit | Attending: Interventional Radiology | Admitting: Interventional Radiology

## 2022-05-17 DIAGNOSIS — K769 Liver disease, unspecified: Secondary | ICD-10-CM | POA: Diagnosis not present

## 2022-05-17 DIAGNOSIS — K8020 Calculus of gallbladder without cholecystitis without obstruction: Secondary | ICD-10-CM | POA: Diagnosis not present

## 2022-05-17 DIAGNOSIS — C220 Liver cell carcinoma: Secondary | ICD-10-CM | POA: Insufficient documentation

## 2022-05-17 DIAGNOSIS — K7460 Unspecified cirrhosis of liver: Secondary | ICD-10-CM | POA: Diagnosis not present

## 2022-05-17 DIAGNOSIS — M10222 Drug-induced gout, left elbow: Secondary | ICD-10-CM

## 2022-05-17 DIAGNOSIS — K449 Diaphragmatic hernia without obstruction or gangrene: Secondary | ICD-10-CM | POA: Diagnosis not present

## 2022-05-17 MED ORDER — GADOBUTROL 1 MMOL/ML IV SOLN
7.0000 mL | Freq: Once | INTRAVENOUS | Status: AC | PRN
  Administered 2022-05-17: 7 mL via INTRAVENOUS

## 2022-05-22 ENCOUNTER — Other Ambulatory Visit: Payer: Self-pay | Admitting: Physician Assistant

## 2022-05-22 DIAGNOSIS — G4700 Insomnia, unspecified: Secondary | ICD-10-CM

## 2022-05-25 ENCOUNTER — Telehealth: Payer: Self-pay | Admitting: *Deleted

## 2022-05-25 ENCOUNTER — Ambulatory Visit
Admission: RE | Admit: 2022-05-25 | Discharge: 2022-05-25 | Disposition: A | Discharge status: Home / Self Care | Payer: 59 | Source: Ambulatory Visit | Attending: Interventional Radiology | Admitting: Interventional Radiology

## 2022-05-25 DIAGNOSIS — E876 Hypokalemia: Secondary | ICD-10-CM

## 2022-05-25 DIAGNOSIS — C220 Liver cell carcinoma: Secondary | ICD-10-CM

## 2022-05-25 DIAGNOSIS — Z79899 Other long term (current) drug therapy: Secondary | ICD-10-CM

## 2022-05-25 DIAGNOSIS — I480 Paroxysmal atrial fibrillation: Secondary | ICD-10-CM

## 2022-05-25 HISTORY — PX: IR RADIOLOGIST EVAL & MGMT: IMG5224

## 2022-05-25 NOTE — Telephone Encounter (Signed)
Left voicemail message to call back for review of results and recommendations.  

## 2022-05-25 NOTE — Telephone Encounter (Signed)
-----   Message from Adriana Mu, PA-C sent at 05/14/2022  4:23 PM EST ----- Potassium remains low at 3.0 Glucose elevated Renal function mildly elevated, though consistent with prior readings dating back to at least 2021 Blood count normal  Recommendations: -Increase potassium 8 mEq from Monday, Wednesday, Friday to daily -Recheck BMET 1 week

## 2022-05-25 NOTE — Progress Notes (Signed)
Chief Complaint: Patient was consulted remotely today (TeleHealth) for EtOH cirrhosis complicated by hepatocellular cancer at the request of Perl Kerney K.    Referring Physician(s): Dr. Earlie Server   History of Present Illness: Adriana Miller is a 67 y.o. 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  She presented with a 2.0 cm segment II and 1.7 cm segment III mass and underwent split dose radiation segmentectomies on 10/02/19.     Her recovery was uneventful.  Initial post therapy liver MRI dated 01/03/2020 demonstrated an excellent response to therapy with no evidence of residual disease in the segment 2 lesion and equivocal residual enhancement in the segment 3 lesion.   MRI dated 04/14/2020 demonstrates continued treatment response to lesions in both the segment 2 and segment 3 with no evidence of residual viable tumor.  No new hepatic lesions are identified.   MRI 07/02/20:  Treated hepatic segment II lesion  LR TR nonviable. Treated hepatic segment III lesion LR TR nonviable.  No new suspicious hepatic lesions.   MRI 10/25/20:  Treated lesions in segments 2 and 3 of the liver again demonstrate no evidence of recurrence/viability. No new hepatic lesions are otherwise noted.     MRI 02/06/21:  Redemonstrated, well-circumscribed, nonenhancing lesion of hepatic segment II, consistent with treated, nonviable hepatocellular carcinoma following embolization and radiotherapy.  Additional lesion of hepatic segment III previously reported is not appreciated on this examination.   MRI 06/05/21:  1. Unchanged, circumscribed, nonenhancing lesion of hepatic segment II, consistent with treated, nonviable hepatocellular carcinoma (LI-RADS TR nonviable). 2. As on prior examination, a previously seen lesion of hepatic segment III is not well appreciated and without suspicious contrast enhancement in this vicinity.   MRI  11/16/21:  1. Unchanged small nonenhancing lesion of posterior hepatic segment II measuring 0.9 x 0.6 cm, consistent with treated hepatocellular carcinoma, without evidence of residual viable tissue. LI-RADS category TR, nonviable. 2. Focus of arterial hyperenhancement in the anterior right lobe of the liver, measuring 1.1 x 0.8 cm. No evidence of washout or capsular enhancement. LI-RADS category 3, intermediate suspicion for hepatocellular carcinoma. Recommend attention on follow-up at 6 months.  MRI 05/17/22: 1. Interval increase in size of the 11 x 10 mm arterially enhancing lesion in the anterior right lobe of the liver but less than that of threshold growth and without washout or capsule. There is associated reduced diffusivity corresponding with this lesion, which would be indicative of a LI-RADS category 4.   Adriana Miller reports that she is feeling very sleepy and having lower energy lately.  She last saw her primary care physician 1 month previously and was given some vitamins.  She complains that she has poor transportation and cannot get to where she wants to go.  Past Medical History:  Diagnosis Date   Alcoholic cirrhosis of liver without ascites ([place])    B12 deficiency 04/05/2017   Blood in stool    Cerebral aneurysm    Chronic kidney disease    Diabetes mellitus without complication ([place])    type 2   Gastric erosion with bleeding    Hepatocellular carcinoma ([place])    Hyperlipidemia    Hypertension    Lower extremity cellulitis 02/08/2019   Paroxysmal A-fib ([facility])    Seizure disorder [facility])     Past Surgical History:  Procedure Laterality Date   CEREBRAL ANEURYSM REPAIR  1991   COLONOSCOPY WITH PROPOFOL N/A 02/12/2019   Procedure: COLONOSCOPY WITH  PROPOFOL;  Surgeon: Lin Landsman, MD;  Location: [facility] ENDOSCOPY;  Service: Gastroenterology;  Laterality: N/A;   ESOPHAGOGASTRODUODENOSCOPY (EGD) WITH PROPOFOL N/A 02/12/2019   Procedure: ESOPHAGOGASTRODUODENOSCOPY (EGD) WITH  PROPOFOL;  Surgeon: Lin Landsman, MD;  Location: [facility] ENDOSCOPY;  Service: Gastroenterology;  Laterality: N/A;   IR ANGIOGRAM SELECTIVE EACH ADDITIONAL VESSEL  09/21/2019   IR ANGIOGRAM SELECTIVE EACH ADDITIONAL VESSEL  09/21/2019   IR ANGIOGRAM SELECTIVE EACH ADDITIONAL VESSEL  09/21/2019   IR ANGIOGRAM SELECTIVE EACH ADDITIONAL VESSEL  09/21/2019   IR ANGIOGRAM SELECTIVE EACH ADDITIONAL VESSEL  10/02/2019   IR ANGIOGRAM VISCERAL SELECTIVE  09/21/2019   IR ANGIOGRAM VISCERAL SELECTIVE  09/21/2019   IR ANGIOGRAM VISCERAL SELECTIVE  10/02/2019   IR EMBO TUMOR ORGAN ISCHEMIA INFARCT INC GUIDE ROADMAPPING  10/02/2019   IR EMBO TUMOR ORGAN ISCHEMIA INFARCT INC GUIDE ROADMAPPING  10/02/2019   IR RADIOLOGIST EVAL & MGMT  08/28/2019   IR RADIOLOGIST EVAL & MGMT  01/30/2020   IR RADIOLOGIST EVAL & MGMT  04/30/2020   IR RADIOLOGIST EVAL & MGMT  07/30/2020   IR RADIOLOGIST EVAL & MGMT  10/28/2020   IR RADIOLOGIST EVAL & MGMT  02/24/2021   IR RADIOLOGIST EVAL & MGMT  06/08/2021   IR RADIOLOGIST EVAL & MGMT  11/24/2021   IR RADIOLOGIST EVAL & MGMT  05/25/2022   IR US GUIDE VASC ACCESS LEFT  09/21/2019   IR US GUIDE VASC ACCESS LEFT  10/02/2019   OPEN REDUCTION INTERNAL FIXATION (ORIF) DISTAL RADIAL FRACTURE Right 08/06/2021   Procedure: OPEN REDUCTION INTERNAL FIXATION (ORIF) DISTAL RADIAL FRACTURE;  Surgeon: Hessie Knows, MD;  Location: [facility] ORS;  Service: Orthopedics;  Laterality: Right;    Allergies: Aspirin  Medications: Prior to Admission medications   Medication Sig Start Date End Date Taking? Authorizing Provider  Alcohol Swabs (B-D SINGLE USE SWABS REGULAR) PADS Use as directed  twice daily Patient not taking: Reported on 05/14/2022 04/15/22   Mylinda Latina, PA-C  allopurinol (ZYLOPRIM) 100 MG tablet TAKE 1 TABLET BY MOUTH DAILY 05/17/22   McDonough, Si Gaul, PA-C  BD PEN NEEDLE NANO 2ND GEN 32G X 4 MM MISC USE AS DIRECTED WITH TOUJEO Patient not taking: Reported on 05/14/2022 07/25/21   Mylinda Latina, PA-C  Blood Glucose Monitoring Suppl (ACCU-CHEK GUIDE ME) w/Device KIT Use as directed  dx E11.65 Patient not taking: Reported on 05/14/2022 11/09/21   Mylinda Latina, PA-C  Calcium Carb-Cholecalciferol (CALCIUM 500/D) 500-5 MG-MCG TABS Take 1 tab po daily 12/07/21   McDonough, Si Gaul, PA-C  Continuous Blood Gluc Receiver (DEXCOM G7 RECEIVER) DEVI Use for continuous blood glucose monitoring  DX E11.65 Patient not taking: Reported on 05/14/2022 11/09/21   Mylinda Latina, PA-C  Continuous Blood Gluc Sensor (DEXCOM G7 SENSOR) MISC Use as directed for continuous glucose monitoring.  Change sensor every 10 days   E11.65 11/09/21   McDonough, Lauren K, PA-C  ELIQUIS 5 MG TABS tablet TAKE 1 TABLET BY MOUTH TWICE A DAY 05/14/22   End, Harrell Gave, MD  ergocalciferol (VITAMIN D2) 1.25 MG (50000 UT) capsule Take 1 capsule (50,000 Units total) by mouth once a week. 05/13/22   McDonough, Lauren K, PA-C  escitalopram (LEXAPRO) 10 MG tablet TAKE 1 TABLET (10 MG TOTAL) BY MOUTH DAILY. FOR ANXIETY/SLEEP 03/16/22   McDonough, Si Gaul, PA-C  furosemide (LASIX) 20 MG tablet TAKE 1 TABLET (20 MG TOTAL) BY MOUTH DAILY. NEED APPT FOR MORE REFILLS 05/07/22   End, Harrell Gave, MD  glimepiride (AMARYL)  1 MG tablet TAKE 1 TABLET BY MOUTH EVERY DAY WITH BREAKFAST 01/21/22   McDonough, Lauren K, PA-C  glucose blood (ACCU-CHEK GUIDE) test strip USE AS INSTRUCTED ONCE A DAILY 03/16/22   McDonough, Si Gaul, PA-C  HYDROcodone-acetaminophen (NORCO) 5-325 MG tablet Take 1 tablet by mouth every 6 (six) hours as needed for moderate pain. 08/06/21   Hessie Knows, MD  insulin glargine, 1 Unit Dial, (TOUJEO SOLOSTAR) 300 UNIT/ML Solostar Pen INJECT 30 UNITS SUBCUTANEOUSLY IN THE EVENING WITH FOOD 01/21/22   McDonough, Si Gaul, PA-C  metoprolol tartrate (LOPRESSOR) 25 MG tablet TAKE 0.5 TABLETS BY MOUTH 2 TIMES DAILY. 01/20/22   McDonough, Si Gaul, PA-C  mirtazapine (REMERON) 7.5 MG tablet TAKE 1 TABLET BY MOUTH EVERYDAY AT  BEDTIME 01/21/22   McDonough, Lauren K, PA-C  potassium chloride (KLOR-CON) 8 MEQ tablet TAKE 1 TAB BY MOUTH MONDAY,WEDNESDAY AND FRIDAY 03/08/22   Mylinda Latina, PA-C  Safety Lancets 28G MISC Use as directed once a daily Dx e11.65 02/04/20   Luiz Ochoa, NP  spironolactone (ALDACTONE) 25 MG tablet Take 1 tablet (25 mg total) by mouth daily. For cirrhosis of liver 01/20/22   McDonough, Si Gaul, PA-C  traMADol (ULTRAM) 50 MG tablet Take 50 mg by mouth every 6 (six) hours as needed.    [provider]  traZODone (DESYREL) 50 MG tablet TAKE 1 TO 2 TABLETS BY MOUTH AT  BEDTIME FOR INSOMNIA 05/24/22   McDonough, Si Gaul, PA-C     Family History  Problem Relation Age of Onset   Heart attack Brother 48    Social History   Socioeconomic History   Marital status: Divorced    Spouse name: Not on file   Number of children: Not on file   Years of education: Not on file   Highest education level: Not on file  Occupational History   Not on file  Tobacco Use   Smoking status: Never   Smokeless tobacco: Current    Types: Snuff  Vaping Use   Vaping Use: Never used  Substance and Sexual Activity   Alcohol use: Yes    Alcohol/week: 14.0 standard drinks of alcohol    Types: 14 Cans of beer per week    Comment: twice a week (beer)   Drug use: No   Sexual activity: Not Currently  Other Topics Concern   Not on file  Social History Narrative   Lives alone   Social Determinants of Health   Financial Resource Strain: Not on file  Food Insecurity: Not on file  Transportation Needs: Not on file  Physical Activity: Not on file  Stress: Not on file  Social Connections: Not on file    ECOG Status: 2 - Symptomatic, <50% confined to bed  Review of Systems  Review of Systems: A 12 point ROS discussed and pertinent positives are indicated in the HPI above.  All other systems are negative.  Physical Exam No direct physical exam was performed (except for noted visual exam  findings with Video Visits).    Vital Signs: There were no vitals taken for this visit.  Imaging: IR Radiologist Eval & Mgmt  Result Date: 05/25/2022 EXAM: ESTABLISHED PATIENT OFFICE VISIT CHIEF COMPLAINT: SEE EPIC NOTE HISTORY OF PRESENT ILLNESS: SEE EPIC NOTE REVIEW OF SYSTEMS: SEE EPIC NOTE PHYSICAL EXAMINATION: SEE EPIC NOTE ASSESSMENT AND PLAN: SEE EPIC NOTE Electronically Signed   By: Jacqulynn Cadet M.D.   On: 05/25/2022 14:12   MR ABDOMEN WWO CONTRAST  Result Date:  05/18/2022 CLINICAL DATA:  Cirrhosis, follow-up HCC EXAM: MRI ABDOMEN WITHOUT AND WITH CONTRAST TECHNIQUE: Multiplanar multisequence MR imaging of the abdomen was performed both before and after the administration of intravenous contrast. CONTRAST:  57m GADAVIST GADOBUTROL 1 MMOL/ML IV SOLN COMPARISON:  Multiple priors including most recent MRI abdomen November 16, 2021. FINDINGS: Despite efforts by the technologist and patient, motion artifact is present on today's exam and could not be eliminated. This reduces exam sensitivity and specificity. Lower chest: No acute findings. Hepatobiliary: Cirrhotic hepatic morphology with similar atrophic slightly T2 hyperintense appearance of the lateral left lobe of the liver (segment II and III), which demonstrates delayed postcontrast enhancement throughout the segment compatible with fibrosis status post prior radiation and embolization. Decreased conspicuity of the subtle hypoenhancing focus in the posterior hepatic segment II least in part secondary to motion only well seen on image 38/16 and 38/18. Stable T2 hyperintense 10 mm segment VI hepatic focus on image 19/13, which does not demonstrate postcontrast enhancement. Interval increased size of the arterially enhancing lesion in the anterior right lobe of the liver, which is difficult to accurately characterize given motion on this examination, nail subtly hyperenhancing to background parenchyma measuring 11 x 10 mm on image 29/16 without  enhancement or capsule but with restricted diffusion on image 84/8. No new suspicious arterial enhancing hepatic lesion identified. Cholelithiasis in a distended gallbladder. No biliary ductal dilation. Pancreas: No pancreatic ductal dilation or evidence of acute inflammation. Spleen: No splenomegaly. Stable small lymphangioma or cyst in the spleen measuring 15 mm on image 28/16. Adrenals/Urinary Tract: Bilateral adrenal glands are within normal limits. Intrinsically T1 hyperintense 8 mm left renal lesion on image 51/10 is compatible with a benign hemorrhagic/proteinaceous renal cysts. Additional benign fluid signal bilateral renal cysts requiring no independent imaging follow-up. Stomach/Bowel: Small hiatal hernia. No evidence of bowel obstruction. Vascular/Lymphatic: Normal caliber abdominal aorta. Smooth IVC contours. Portal, splenic and superior mesenteric veins are patent. No pathologically enlarged abdominal lymph nodes. Other:  No significant abdominal free fluid. Musculoskeletal: No suspicious bone lesions identified. IMPRESSION: Motion degraded examination.  Within this context colon 1. Interval increase in size of the 11 x 10 mm arterially enhancing lesion in the anterior right lobe of the liver but less than that of threshold growth and without washout or capsule. There is associated reduced diffusivity corresponding with this lesion, which would be indicative of a LI-RADS category 4. However given the motion degrading on this examination could consider a short interval follow-up pre and postcontrast enhanced MRI in 2-3 months for further evaluation. 2. Decreased conspicuity of the subtle hypoenhancing focus in the posterior hepatic segment II, at least in part secondary to motion. 3. Cirrhotic hepatic morphology with similar posttreatment atrophy of the lateral left lobe of the liver. 4. Cholelithiasis in a distended gallbladder. No biliary ductal dilation. 5. Small hiatal hernia. Electronically Signed    By: JDahlia BailiffM.D.   On: 05/18/2022 11:42    Labs:  CBC: Recent Labs    07/23/21 1542 11/16/21 1716 05/14/22 1555  WBC 5.2 6.2 8.8  HGB 13.3 13.5 13.4  HCT 41.0 41.9 41.2  PLT 169 181 181    COAGS: Recent Labs    07/23/21 1605  INR 1.3*    BMP: Recent Labs    07/23/21 1542 11/16/21 1716 05/14/22 1555  NA 135 142 138  K 3.4* 3.1* 3.0*  CL 101 105 99  CO2 '23 28 27  '$ GLUCOSE 159* 95 170*  BUN 12 9 9  CALCIUM 8.2* 8.3* 8.5*  CREATININE 1.34* 1.02* 1.21*  GFRNONAA 43* 60* 49*    LIVER FUNCTION TESTS: Recent Labs    11/16/21 1716  BILITOT 1.5*  AST 29  ALT 13  ALKPHOS 72  PROT 7.9  ALBUMIN 2.9*    TUMOR MARKERS: No results for input(s): "AFPTM", "CEA", "CA199", "CHROMGRNA" in the last 8760 hours.  Assessment and Plan:  Pleasant but increasingly frail 67 year old female now nearly 3 years status post radiation segmentectomy's of hepatic segments 2 and 3 for hepatocellular carcinoma.  She has thus far experienced a complete response to therapy with no evidence of residual, recurrent or new disease on follow-up surveillance MRI.  A small arterially enhancing focus in segment 4 is now identified but is indeterminant by imaging.  This warrants close attention on follow-up MRI.    I have some concern that Adriana Miller would be able to tolerate additional liver directed therapy.  We will continue surveillance imaging for now.   1.)  Gadolinium enhanced MRI of the abdomen and clinic visit in 6 months (August 2024).    Electronically Signed: Criselda Peaches 05/25/2022, 2:17 PM   I spent a total of 10 Minutes in remote  clinical consultation, greater than 50% of which was counseling/coordinating care for hepatocellular cancer.    Visit type: Audio only (telephone). Audio (no video) only due to patient's lack of internet/smartphone capability. Alternative for in-person consultation at [facility], [address], [place], [place]. This visit  type was conducted due to national recommendations for restrictions regarding the COVID-19 Pandemic (e.g. social distancing).  This format is felt to be most appropriate for this patient at this time.  All issues noted in this document were discussed and addressed.

## 2022-05-26 ENCOUNTER — Other Ambulatory Visit: Payer: 59

## 2022-05-26 VITALS — BP 162/90 | HR 88 | Temp 97.6°F

## 2022-05-26 DIAGNOSIS — Z515 Encounter for palliative care: Secondary | ICD-10-CM

## 2022-05-26 NOTE — Progress Notes (Signed)
PATIENT NAME: Adriana Miller DOB: 02/04/1956 MRN: [id number]  PRIMARY CARE PROVIDER: Mylinda Latina, PA-C  RESPONSIBLE PARTY:  Acct ID - Guarantor Home Phone Work Phone Relationship Acct Type  [id number] YATZARY, [phone]  Self P/F     [name], [place] [address]    Home visit completed with patient.   Appetite:  Patient endorse poor appetite.  Only eating 1 meal a day.  Receiving meals on wheels but does not consisently eat.  She is placing them in the freezer if she does not want the meal.  Patient endorses good liquid intake and keeps bottles of water by her bed.  Last documented weight 175 lbs.  Weight has been stable over the last few months.   Diabetes:  Patient is unable to manage her Dexcom 7.  Last used when I was here in October.  She advised her PCP checked her blood sugar last month in the office but does not recall the reading.  I applied a new sensor on patient and provided education on the set up.  Blood sugar reading is 116.  Low Energy:  Patient endorses low energy level and feeling fatigue.  Very little activity reported.  Patient mostly remains in her bed unless her aide is present.   Medication Management:  Patient is currently using a pill box and fills this every 2 weeks.  She did not take her medications this am but took them on my visit.    Patient has not started Vitamin D or Calcium that was ordered by PCP.  She will have her aide pick medications up this week and add to her pill box.   Memory Deficits:  Patient does not recall the phone conversation yesterday to discuss her ultrasound results.  She feels she is becoming more forgetful.   Sadness/Loneliness:  Patient reports feelings of sadness and loneliness.  She reports her brother comes by to see her and she has an Engineer, production.  She denies any suicidal ideation.    Weakness:  Patient endorses weakness.  Using a rollator to ambulate.  No reported falls.    CODE STATUS: DNR ADVANCED  DIRECTIVES: No MOST FORM: Yes PPS: 50%   PHYSICAL EXAM:   VITALS: Today's Vitals   05/26/22 1132  BP: (!) 162/90  Temp: 97.6 F (36.4 C)  SpO2: 99%    LUNGS: clear to auscultation  CARDIAC: Cor RRR} occasional extra beats EXTREMITIES: - for edema SKIN: Skin color, texture, turgor normal. No rashes or lesions or mobility and turgor normal  NEURO: positive for memory problems and weakness       [name], RN

## 2022-06-09 NOTE — Telephone Encounter (Signed)
Left voicemail message to call back for review of recommendations.

## 2022-06-14 NOTE — Telephone Encounter (Signed)
Reviewed results and recommendations with patient. She verbalized understanding but requests that I please reach out to her aide. No number listed for her so she recommended that I reach out to her brother for her number. Advised that I would be happy to call him.

## 2022-06-14 NOTE — Telephone Encounter (Signed)
Left voicemail message to call back  

## 2022-06-15 ENCOUNTER — Other Ambulatory Visit: Payer: Self-pay

## 2022-06-15 MED ORDER — POTASSIUM CHLORIDE ER 8 MEQ PO TBCR: 8.0000 meq | EXTENDED_RELEASE_TABLET | Freq: Every day | ORAL | 3 refills | Status: DC

## 2022-06-15 NOTE — Telephone Encounter (Signed)
Called and spoke with aide per patients request. Reviewed results and recommendations and she verbalized understanding with no further questions at this time.

## 2022-06-15 NOTE — Telephone Encounter (Signed)
Called patients brother and he provided me number for patients aide [phone].

## 2022-06-17 ENCOUNTER — Emergency Department: Payer: 59

## 2022-06-17 ENCOUNTER — Encounter: Payer: Self-pay | Admitting: Radiology

## 2022-06-17 ENCOUNTER — Other Ambulatory Visit: Payer: Self-pay

## 2022-06-17 ENCOUNTER — Inpatient Hospital Stay
Admission: EM | Admit: 2022-06-17 | Discharge: 2022-06-19 | DRG: 565 | Disposition: A | Discharge status: Home Health | Payer: 59 | Attending: Internal Medicine | Admitting: Internal Medicine

## 2022-06-17 DIAGNOSIS — N182 Chronic kidney disease, stage 2 (mild): Secondary | ICD-10-CM | POA: Diagnosis present

## 2022-06-17 DIAGNOSIS — R319 Hematuria, unspecified: Secondary | ICD-10-CM | POA: Diagnosis present

## 2022-06-17 DIAGNOSIS — G40909 Epilepsy, unspecified, not intractable, without status epilepticus: Secondary | ICD-10-CM | POA: Diagnosis present

## 2022-06-17 DIAGNOSIS — Z886 Allergy status to analgesic agent status: Secondary | ICD-10-CM

## 2022-06-17 DIAGNOSIS — Z7984 Long term (current) use of oral hypoglycemic drugs: Secondary | ICD-10-CM

## 2022-06-17 DIAGNOSIS — R791 Abnormal coagulation profile: Secondary | ICD-10-CM | POA: Diagnosis present

## 2022-06-17 DIAGNOSIS — K7030 Alcoholic cirrhosis of liver without ascites: Secondary | ICD-10-CM | POA: Diagnosis present

## 2022-06-17 DIAGNOSIS — C220 Liver cell carcinoma: Secondary | ICD-10-CM | POA: Diagnosis not present

## 2022-06-17 DIAGNOSIS — Z79891 Long term (current) use of opiate analgesic: Secondary | ICD-10-CM

## 2022-06-17 DIAGNOSIS — Z8505 Personal history of malignant neoplasm of liver: Secondary | ICD-10-CM

## 2022-06-17 DIAGNOSIS — E872 Acidosis, unspecified: Secondary | ICD-10-CM

## 2022-06-17 DIAGNOSIS — S8991XA Unspecified injury of right lower leg, initial encounter: Secondary | ICD-10-CM | POA: Diagnosis not present

## 2022-06-17 DIAGNOSIS — A419 Sepsis, unspecified organism: Principal | ICD-10-CM | POA: Diagnosis present

## 2022-06-17 DIAGNOSIS — S0993XA Unspecified injury of face, initial encounter: Secondary | ICD-10-CM | POA: Diagnosis not present

## 2022-06-17 DIAGNOSIS — Z1152 Encounter for screening for COVID-19: Secondary | ICD-10-CM

## 2022-06-17 DIAGNOSIS — I480 Paroxysmal atrial fibrillation: Secondary | ICD-10-CM | POA: Diagnosis present

## 2022-06-17 DIAGNOSIS — I4891 Unspecified atrial fibrillation: Secondary | ICD-10-CM | POA: Diagnosis present

## 2022-06-17 DIAGNOSIS — E785 Hyperlipidemia, unspecified: Secondary | ICD-10-CM | POA: Diagnosis not present

## 2022-06-17 DIAGNOSIS — R296 Repeated falls: Secondary | ICD-10-CM | POA: Diagnosis present

## 2022-06-17 DIAGNOSIS — T796XXA Traumatic ischemia of muscle, initial encounter: Secondary | ICD-10-CM

## 2022-06-17 DIAGNOSIS — S0990XA Unspecified injury of head, initial encounter: Secondary | ICD-10-CM | POA: Diagnosis not present

## 2022-06-17 DIAGNOSIS — Z6829 Body mass index (BMI) 29.0-29.9, adult: Secondary | ICD-10-CM

## 2022-06-17 DIAGNOSIS — Z87898 Personal history of other specified conditions: Secondary | ICD-10-CM

## 2022-06-17 DIAGNOSIS — E860 Dehydration: Secondary | ICD-10-CM | POA: Diagnosis not present

## 2022-06-17 DIAGNOSIS — Z66 Do not resuscitate: Secondary | ICD-10-CM | POA: Diagnosis not present

## 2022-06-17 DIAGNOSIS — M79631 Pain in right forearm: Secondary | ICD-10-CM | POA: Diagnosis not present

## 2022-06-17 DIAGNOSIS — H1089 Other conjunctivitis: Secondary | ICD-10-CM | POA: Diagnosis not present

## 2022-06-17 DIAGNOSIS — Z794 Long term (current) use of insulin: Secondary | ICD-10-CM | POA: Diagnosis not present

## 2022-06-17 DIAGNOSIS — I5032 Chronic diastolic (congestive) heart failure: Secondary | ICD-10-CM | POA: Diagnosis not present

## 2022-06-17 DIAGNOSIS — R6889 Other general symptoms and signs: Secondary | ICD-10-CM | POA: Diagnosis not present

## 2022-06-17 DIAGNOSIS — Z7901 Long term (current) use of anticoagulants: Secondary | ICD-10-CM

## 2022-06-17 DIAGNOSIS — W1830XA Fall on same level, unspecified, initial encounter: Secondary | ICD-10-CM | POA: Diagnosis present

## 2022-06-17 DIAGNOSIS — I130 Hypertensive heart and chronic kidney disease with heart failure and stage 1 through stage 4 chronic kidney disease, or unspecified chronic kidney disease: Secondary | ICD-10-CM | POA: Diagnosis present

## 2022-06-17 DIAGNOSIS — Z743 Need for continuous supervision: Secondary | ICD-10-CM | POA: Diagnosis not present

## 2022-06-17 DIAGNOSIS — Z8249 Family history of ischemic heart disease and other diseases of the circulatory system: Secondary | ICD-10-CM

## 2022-06-17 DIAGNOSIS — Z888 Allergy status to other drugs, medicaments and biological substances status: Secondary | ICD-10-CM | POA: Diagnosis not present

## 2022-06-17 DIAGNOSIS — M25561 Pain in right knee: Secondary | ICD-10-CM | POA: Diagnosis not present

## 2022-06-17 DIAGNOSIS — M6282 Rhabdomyolysis: Secondary | ICD-10-CM | POA: Diagnosis not present

## 2022-06-17 DIAGNOSIS — R609 Edema, unspecified: Secondary | ICD-10-CM | POA: Diagnosis not present

## 2022-06-17 DIAGNOSIS — N200 Calculus of kidney: Secondary | ICD-10-CM | POA: Diagnosis not present

## 2022-06-17 DIAGNOSIS — R000 Tachycardia, unspecified: Secondary | ICD-10-CM | POA: Diagnosis not present

## 2022-06-17 DIAGNOSIS — F10188 Alcohol abuse with other alcohol-induced disorder: Secondary | ICD-10-CM | POA: Diagnosis not present

## 2022-06-17 DIAGNOSIS — Z720 Tobacco use: Secondary | ICD-10-CM | POA: Diagnosis not present

## 2022-06-17 DIAGNOSIS — E1122 Type 2 diabetes mellitus with diabetic chronic kidney disease: Secondary | ICD-10-CM | POA: Diagnosis present

## 2022-06-17 DIAGNOSIS — H109 Unspecified conjunctivitis: Secondary | ICD-10-CM | POA: Diagnosis not present

## 2022-06-17 DIAGNOSIS — T796XXD Traumatic ischemia of muscle, subsequent encounter: Secondary | ICD-10-CM | POA: Diagnosis not present

## 2022-06-17 DIAGNOSIS — E1165 Type 2 diabetes mellitus with hyperglycemia: Secondary | ICD-10-CM

## 2022-06-17 DIAGNOSIS — S199XXA Unspecified injury of neck, initial encounter: Secondary | ICD-10-CM | POA: Diagnosis not present

## 2022-06-17 DIAGNOSIS — S0083XA Contusion of other part of head, initial encounter: Secondary | ICD-10-CM | POA: Diagnosis not present

## 2022-06-17 DIAGNOSIS — M79604 Pain in right leg: Secondary | ICD-10-CM | POA: Diagnosis not present

## 2022-06-17 DIAGNOSIS — E119 Type 2 diabetes mellitus without complications: Secondary | ICD-10-CM | POA: Diagnosis not present

## 2022-06-17 DIAGNOSIS — M79601 Pain in right arm: Secondary | ICD-10-CM | POA: Diagnosis not present

## 2022-06-17 DIAGNOSIS — Z923 Personal history of irradiation: Secondary | ICD-10-CM

## 2022-06-17 DIAGNOSIS — Z79899 Other long term (current) drug therapy: Secondary | ICD-10-CM

## 2022-06-17 DIAGNOSIS — K760 Fatty (change of) liver, not elsewhere classified: Secondary | ICD-10-CM | POA: Diagnosis not present

## 2022-06-17 LAB — RESP PANEL BY RT-PCR (RSV, FLU A&B, COVID)  RVPGX2
Influenza A by PCR: NEGATIVE
Influenza B by PCR: NEGATIVE
Resp Syncytial Virus by PCR: NEGATIVE
SARS Coronavirus 2 by RT PCR: NEGATIVE

## 2022-06-17 LAB — CBC WITH DIFFERENTIAL/PLATELET
Abs Immature Granulocytes: 0.02 10*3/uL (ref 0.00–0.07)
Basophils Absolute: 0 10*3/uL (ref 0.0–0.1)
Basophils Relative: 0 %
Eosinophils Absolute: 0 10*3/uL (ref 0.0–0.5)
Eosinophils Relative: 0 %
HCT: 45.6 % (ref 36.0–46.0)
Hemoglobin: 14.4 g/dL (ref 12.0–15.0)
Immature Granulocytes: 0 %
Lymphocytes Relative: 9 %
Lymphs Abs: 0.7 10*3/uL (ref 0.7–4.0)
MCH: 31.4 pg (ref 26.0–34.0)
MCHC: 31.6 g/dL (ref 30.0–36.0)
MCV: 99.6 fL (ref 80.0–100.0)
Monocytes Absolute: 0.4 10*3/uL (ref 0.1–1.0)
Monocytes Relative: 6 %
Neutro Abs: 6.5 10*3/uL (ref 1.7–7.7)
Neutrophils Relative %: 85 %
Platelets: 139 10*3/uL — ABNORMAL LOW (ref 150–400)
RBC: 4.58 MIL/uL (ref 3.87–5.11)
RDW: 14.2 % (ref 11.5–15.5)
WBC: 7.7 10*3/uL (ref 4.0–10.5)
nRBC: 0 % (ref 0.0–0.2)

## 2022-06-17 LAB — COMPREHENSIVE METABOLIC PANEL
ALT: 29 U/L (ref 0–44)
AST: 88 U/L — ABNORMAL HIGH (ref 15–41)
Albumin: 3.2 g/dL — ABNORMAL LOW (ref 3.5–5.0)
Alkaline Phosphatase: 72 U/L (ref 38–126)
Anion gap: 20 — ABNORMAL HIGH (ref 5–15)
BUN: 13 mg/dL (ref 8–23)
CO2: 18 mmol/L — ABNORMAL LOW (ref 22–32)
Calcium: 8.2 mg/dL — ABNORMAL LOW (ref 8.9–10.3)
Chloride: 106 mmol/L (ref 98–111)
Creatinine, Ser: 1.14 mg/dL — ABNORMAL HIGH (ref 0.44–1.00)
GFR, Estimated: 52 mL/min — ABNORMAL LOW (ref >60)
Glucose, Bld: 96 mg/dL (ref 70–99)
Potassium: 4 mmol/L (ref 3.5–5.1)
Sodium: 144 mmol/L (ref 135–145)
Total Bilirubin: 1.2 mg/dL (ref 0.3–1.2)
Total Protein: 8.4 g/dL — ABNORMAL HIGH (ref 6.5–8.1)

## 2022-06-17 LAB — URINALYSIS, COMPLETE (UACMP) WITH MICROSCOPIC
Bilirubin Urine: NEGATIVE
Glucose, UA: NEGATIVE mg/dL
Ketones, ur: 5 mg/dL — AB
Nitrite: NEGATIVE
Protein, ur: 100 mg/dL — AB
Specific Gravity, Urine: 1.018 (ref 1.005–1.030)
pH: 5 (ref 5.0–8.0)

## 2022-06-17 LAB — TROPONIN I (HIGH SENSITIVITY): Troponin I (High Sensitivity): 12 ng/L (ref <18)

## 2022-06-17 LAB — APTT: aPTT: 30 seconds (ref 24–36)

## 2022-06-17 LAB — AMMONIA: Ammonia: 20 umol/L (ref 9–35)

## 2022-06-17 LAB — GLUCOSE, CAPILLARY: Glucose-Capillary: 118 mg/dL — ABNORMAL HIGH (ref 70–99)

## 2022-06-17 LAB — CK: Total CK: 1717 U/L — ABNORMAL HIGH (ref 38–234)

## 2022-06-17 LAB — LACTIC ACID, PLASMA
Lactic Acid, Venous: 8.3 mmol/L (ref 0.5–1.9)
Lactic Acid, Venous: 5.7 mmol/L (ref 0.5–1.9)
Lactic Acid, Venous: 4.7 mmol/L (ref 0.5–1.9)

## 2022-06-17 LAB — PROTIME-INR
INR: 1.4 — ABNORMAL HIGH (ref 0.8–1.2)
Prothrombin Time: 16.9 seconds — ABNORMAL HIGH (ref 11.4–15.2)

## 2022-06-17 MED ORDER — FOLIC ACID 1 MG PO TABS
1.0000 mg | ORAL_TABLET | Freq: Every day | ORAL | Status: DC
  Administered 2022-06-18 – 2022-06-19 (×2): 1 mg via ORAL
  Filled 2022-06-17 (×2): qty 1

## 2022-06-17 MED ORDER — SODIUM CHLORIDE 0.9 % IV SOLN
2.0000 g | INTRAVENOUS | Status: DC
  Administered 2022-06-17: 2 g via INTRAVENOUS
  Filled 2022-06-17: qty 20

## 2022-06-17 MED ORDER — LACTATED RINGERS IV SOLN: INTRAVENOUS | Status: DC

## 2022-06-17 MED ORDER — LORAZEPAM 1 MG PO TABS: 1.0000 mg | ORAL_TABLET | ORAL | Status: DC | PRN

## 2022-06-17 MED ORDER — SODIUM CHLORIDE 0.9% FLUSH
3.0000 mL | Freq: Two times a day (BID) | INTRAVENOUS | Status: DC
  Administered 2022-06-17 – 2022-06-18 (×3): 3 mL via INTRAVENOUS

## 2022-06-17 MED ORDER — ADULT MULTIVITAMIN W/MINERALS CH
1.0000 | ORAL_TABLET | Freq: Every day | ORAL | Status: DC
  Administered 2022-06-18: 1 via ORAL
  Filled 2022-06-17: qty 1

## 2022-06-17 MED ORDER — THIAMINE MONONITRATE 100 MG PO TABS
100.0000 mg | ORAL_TABLET | Freq: Every day | ORAL | Status: DC
  Administered 2022-06-18 – 2022-06-19 (×2): 100 mg via ORAL
  Filled 2022-06-17 (×2): qty 1

## 2022-06-17 MED ORDER — ERYTHROMYCIN 5 MG/GM OP OINT
TOPICAL_OINTMENT | Freq: Four times a day (QID) | OPHTHALMIC | Status: DC
  Filled 2022-06-17 (×2): qty 1

## 2022-06-17 MED ORDER — SODIUM CHLORIDE 0.9 % IV BOLUS
500.0000 mL | Freq: Once | INTRAVENOUS | Status: AC
  Administered 2022-06-17: 500 mL via INTRAVENOUS

## 2022-06-17 MED ORDER — SODIUM CHLORIDE 0.9 % IV BOLUS (SEPSIS)
1000.0000 mL | Freq: Once | INTRAVENOUS | Status: AC
  Administered 2022-06-17: 1000 mL via INTRAVENOUS

## 2022-06-17 MED ORDER — POLYETHYLENE GLYCOL 3350 17 G PO PACK: 17.0000 g | PACK | Freq: Every day | ORAL | Status: DC | PRN

## 2022-06-17 MED ORDER — HYDRALAZINE HCL 20 MG/ML IJ SOLN
5.0000 mg | Freq: Three times a day (TID) | INTRAMUSCULAR | Status: DC | PRN
  Filled 2022-06-17: qty 1

## 2022-06-17 MED ORDER — ONDANSETRON HCL 4 MG PO TABS: 4.0000 mg | ORAL_TABLET | Freq: Three times a day (TID) | ORAL | Status: DC | PRN

## 2022-06-17 MED ORDER — ENOXAPARIN SODIUM 40 MG/0.4ML IJ SOSY: 40.0000 mg | PREFILLED_SYRINGE | INTRAMUSCULAR | Status: DC

## 2022-06-17 MED ORDER — INSULIN ASPART 100 UNIT/ML IJ SOLN
0.0000 [IU] | Freq: Three times a day (TID) | INTRAMUSCULAR | Status: DC
  Administered 2022-06-18: 2 [IU] via SUBCUTANEOUS
  Administered 2022-06-19: 3 [IU] via SUBCUTANEOUS
  Filled 2022-06-17 (×2): qty 1

## 2022-06-17 MED ORDER — ACETAMINOPHEN 650 MG RE SUPP: 650.0000 mg | Freq: Four times a day (QID) | RECTAL | Status: DC | PRN

## 2022-06-17 MED ORDER — METRONIDAZOLE 500 MG/100ML IV SOLN
500.0000 mg | Freq: Once | INTRAVENOUS | Status: AC
  Administered 2022-06-17: 500 mg via INTRAVENOUS
  Filled 2022-06-17: qty 100

## 2022-06-17 MED ORDER — ESCITALOPRAM OXALATE 10 MG PO TABS
10.0000 mg | ORAL_TABLET | Freq: Every day | ORAL | Status: DC
  Administered 2022-06-18 – 2022-06-19 (×2): 10 mg via ORAL
  Filled 2022-06-17 (×2): qty 1

## 2022-06-17 MED ORDER — VANCOMYCIN HCL IN DEXTROSE 1-5 GM/200ML-% IV SOLN
1000.0000 mg | Freq: Once | INTRAVENOUS | Status: AC
  Administered 2022-06-17: 1000 mg via INTRAVENOUS
  Filled 2022-06-17: qty 200

## 2022-06-17 MED ORDER — SODIUM CHLORIDE 0.9 % IV BOLUS (SEPSIS)
500.0000 mL | Freq: Once | INTRAVENOUS | Status: AC
  Administered 2022-06-17: 500 mL via INTRAVENOUS

## 2022-06-17 MED ORDER — ALLOPURINOL 100 MG PO TABS
100.0000 mg | ORAL_TABLET | Freq: Every day | ORAL | Status: DC
  Administered 2022-06-18 – 2022-06-19 (×2): 100 mg via ORAL
  Filled 2022-06-17 (×2): qty 1

## 2022-06-17 MED ORDER — APIXABAN 5 MG PO TABS
5.0000 mg | ORAL_TABLET | Freq: Two times a day (BID) | ORAL | Status: DC
  Administered 2022-06-17 – 2022-06-19 (×4): 5 mg via ORAL
  Filled 2022-06-17 (×4): qty 1

## 2022-06-17 MED ORDER — METOPROLOL TARTRATE 25 MG PO TABS
12.5000 mg | ORAL_TABLET | Freq: Two times a day (BID) | ORAL | Status: DC
  Administered 2022-06-17 – 2022-06-19 (×4): 12.5 mg via ORAL
  Filled 2022-06-17 (×4): qty 1

## 2022-06-17 MED ORDER — THIAMINE HCL 100 MG/ML IJ SOLN: 100.0000 mg | Freq: Every day | INTRAMUSCULAR | Status: DC

## 2022-06-17 MED ORDER — ACETAMINOPHEN 325 MG PO TABS
650.0000 mg | ORAL_TABLET | Freq: Four times a day (QID) | ORAL | Status: DC | PRN
  Administered 2022-06-17 – 2022-06-18 (×2): 650 mg via ORAL
  Filled 2022-06-17 (×2): qty 2

## 2022-06-17 MED ORDER — IOHEXOL 300 MG/ML  SOLN
100.0000 mL | Freq: Once | INTRAMUSCULAR | Status: AC | PRN
  Administered 2022-06-17: 100 mL via INTRAVENOUS

## 2022-06-17 MED ORDER — ONDANSETRON HCL 4 MG/2ML IJ SOLN: 4.0000 mg | Freq: Three times a day (TID) | INTRAMUSCULAR | Status: DC | PRN

## 2022-06-17 MED ORDER — SODIUM CHLORIDE 0.9 % IV SOLN
2.0000 g | Freq: Once | INTRAVENOUS | Status: AC
  Administered 2022-06-17: 2 g via INTRAVENOUS
  Filled 2022-06-17: qty 12.5

## 2022-06-17 NOTE — Assessment & Plan Note (Signed)
Patient states her last drink was yesterday.  - CIWA with Ativan as needed - Daily thiamine and folic acid

## 2022-06-17 NOTE — Assessment & Plan Note (Addendum)
Patient presenting after ground-level fall that was mechanical in nature, however she was unable to stand up due to generalized weakness.  CK elevated at 1700.  No evidence of renal dysfunction at this time.  - S/p 3 L bolus - Continue gentle hydration overnight - Monitor for evidence of hypervolemia given history of cirrhosis and heart failure - PT/OT

## 2022-06-17 NOTE — Assessment & Plan Note (Signed)
History of stage II HCC  s/p radiation segmentectomy.  Last MRI in January 2024 with interval increase of lesion in the anterior right lobe.  Patient following with palliative care and IR.

## 2022-06-17 NOTE — Progress Notes (Signed)
PHARMACY -  BRIEF ANTIBIOTIC NOTE   Pharmacy has received consult(s) for vancomycin and cefepime from an ED provider.  The patient's profile has been reviewed for ht/wt/allergies/indication/available labs.    One time order(s) placed for vancomycin 1,000 mg x 1 and cefepime 2 grams x 1  Further antibiotics/pharmacy consults should be ordered by admitting physician if indicated.                       Thank you, Wynelle Cleveland 06/17/2022  2:52 PM

## 2022-06-17 NOTE — ED Notes (Signed)
Pt very hard stick, 3 nurses/ multiple sticks attempted PIV on arrival. PIV established but poor blood flow. Called lab for phlebotomy stick for Blood cultures and repeat lactic.

## 2022-06-17 NOTE — Assessment & Plan Note (Addendum)
Patient's right conjunctiva significantly injected with purulent drainage concerning for bacterial conjunctivitis.  There is some drainage from the left eye as well but scant amounts.  No visual deficits on examination  - Erythromycin ointment every 6 hours

## 2022-06-17 NOTE — Assessment & Plan Note (Signed)
Patient is not currently on any AEDs.

## 2022-06-17 NOTE — ED Provider Notes (Signed)
[facility] Provider Note    Event Date/Time   First MD Initiated Contact with Patient 06/17/22 1318     (approximate)   History   Fall, Weakness, and Tachycardia   HPI  Adriana Miller is a 67 y.o. female medical history including alcoholic cirrhosis, brain aneurysm, diabetes, A-fib seizure disorder presents to the ER for evaluation of multiple falls being found down for greater than 24 hours at home.  Patient found to be tachycardic not febrile.  Complaining of right arm side and leg pain.  Denies any new numbness or tingling.  This that she did have alcohol yesterday.     Physical Exam   Triage Vital Signs: ED Triage Vitals [06/17/22 1330]  Enc Vitals Group     BP      Pulse Rate (!) 131     Resp 16     Temp 97.6 F (36.4 C)     Temp Source Oral     SpO2 100 %     Weight 180 lb (81.6 kg)     Height '5\' 6"'$  (1.676 m)     Head Circumference      Peak Flow      Pain Score 9     Pain Loc      Pain Edu?      Excl. in [place]?     Most recent vital signs: Vitals:   06/17/22 1330 06/17/22 1337  BP:  (!) 138/98  Pulse: (!) 131   Resp: 16   Temp: 97.6 F (36.4 C)   SpO2: 100%      Constitutional: Alert chronically ill-appearing Head: Contusion to right right forehead. Nose: No congestion/rhinnorhea. Mouth/Throat: Mucous membranes are dry Neck: Painless ROM.  Cardiovascular:   Good peripheral circulation. Respiratory: Normal respiratory effort.  No retractions.  Gastrointestinal: Soft and nontender.  Musculoskeletal:  no deformity Neurologic:  MAE spontaneously. No gross focal neurologic deficits are appreciated.  Skin:  Skin is warm, dry and intact. No rash noted. Psychiatric: Calm and cooperative    ED Results / Procedures / Treatments   Labs (all labs ordered are listed, but only abnormal results are displayed) Labs Reviewed  LACTIC ACID, PLASMA - Abnormal; Notable for the following components:      Result Value   Lactic Acid,  Venous 8.3 (*)    All other components within normal limits  COMPREHENSIVE METABOLIC PANEL - Abnormal; Notable for the following components:   CO2 18 (*)    Creatinine, Ser 1.14 (*)    Calcium 8.2 (*)    Total Protein 8.4 (*)    Albumin 3.2 (*)    AST 88 (*)    GFR, Estimated 52 (*)    Anion gap 20 (*)    All other components within normal limits  CBC WITH DIFFERENTIAL/PLATELET - Abnormal; Notable for the following components:   Platelets 139 (*)    All other components within normal limits  PROTIME-INR - Abnormal; Notable for the following components:   Prothrombin Time 16.9 (*)    INR 1.4 (*)    All other components within normal limits  CK - Abnormal; Notable for the following components:   Total CK 1,717 (*)    All other components within normal limits  RESP PANEL BY RT-PCR (RSV, FLU A&B, COVID)  RVPGX2  CULTURE, BLOOD (ROUTINE X 2)  CULTURE, BLOOD (ROUTINE X 2)  APTT  AMMONIA  LACTIC ACID, PLASMA  URINALYSIS, COMPLETE (UACMP) WITH MICROSCOPIC  EKG  ED ECG REPORT I, Merlyn Lot, the attending physician, personally viewed and interpreted this ECG.   Date: 06/17/2022  EKG Time: 13:26  Rate: 125  Rhythm: a fib  Axis: normal  Intervals: borderline prolonged qt  ST&T Change: nonspecific st abn likely rate dependent    RADIOLOGY Please see ED Course for my review and interpretation.  I personally reviewed all radiographic images ordered to evaluate for the above acute complaints and reviewed radiology reports and findings.  These findings were personally discussed with the patient.  Please see medical record for radiology report.    PROCEDURES:  Critical Care performed: Yes, see critical care procedure note(s)  .Critical Care  Performed by: Merlyn Lot, MD Authorized by: Merlyn Lot, MD   Critical care provider statement:    Critical care time (minutes):  45   Critical care was necessary to treat or prevent imminent or life-threatening  deterioration of the following conditions:  Sepsis   Critical care was time spent personally by me on the following activities:  Ordering and performing treatments and interventions, ordering and review of laboratory studies, ordering and review of radiographic studies, pulse oximetry, re-evaluation of patient's condition, review of old charts, obtaining history from patient or surrogate, examination of patient, evaluation of patient's response to treatment, discussions with primary provider, discussions with consultants and development of treatment plan with patient or surrogate    MEDICATIONS ORDERED IN ED: Medications  metroNIDAZOLE (FLAGYL) IVPB 500 mg (500 mg Intravenous New Bag/Given 06/17/22 1505)  vancomycin (VANCOCIN) IVPB 1000 mg/200 mL premix (1,000 mg Intravenous New Bag/Given 06/17/22 1506)  lactated ringers infusion (has no administration in time range)  cefTRIAXone (ROCEPHIN) 2 g in sodium chloride 0.9 % 100 mL IVPB (has no administration in time range)  sodium chloride 0.9 % bolus 500 mL (500 mLs Intravenous New Bag/Given 06/17/22 1347)  sodium chloride 0.9 % bolus 1,000 mL (1,000 mLs Intravenous New Bag/Given 06/17/22 1458)    And  sodium chloride 0.9 % bolus 1,000 mL (1,000 mLs Intravenous New Bag/Given 06/17/22 1457)    And  sodium chloride 0.9 % bolus 500 mL (500 mLs Intravenous New Bag/Given 06/17/22 1458)  ceFEPIme (MAXIPIME) 2 g in sodium chloride 0.9 % 100 mL IVPB (2 g Intravenous New Bag/Given 06/17/22 1501)  iohexol (OMNIPAQUE) 300 MG/ML solution 100 mL (100 mLs Intravenous Contrast Given 06/17/22 1516)     IMPRESSION / MDM / [place] / ED COURSE  I reviewed the triage vital signs and the nursing notes.                              Differential diagnosis includes, but is not limited to, Dehydration, sepsis, pna, uti, hypoglycemia, cva, drug effect, withdrawal, encephalitis ' Patient presenting to the ER for evaluation of symptoms as described above.  Based on  symptoms, risk factors and considered above differential, this presenting complaint could reflect a potentially life-threatening illness therefore the patient will be placed on continuous pulse oximetry and telemetry for monitoring.  Laboratory evaluation will be sent to evaluate for the above complaints.  Patient is chronically ill-appearing will order CT imaging for the but differential as well as x-rays will give IV fluids will check blood work.   Clinical Course as of 06/17/22 1558  Thu Jun 17, 2022  1419 CT head on my review and interpretation does not show any evidence of IPH. [PR]  1429 Patient does have mild rhabdo with  CK of 1700.  No leukocytosis.  Anion gap elevated still waiting on lactate.  Mild acidemia. [PR]  [id number] Patient with critically elevated lactate.  Will treat with broad-spectrum antibiotics IV fluids due to concern for sepsis.  However review of record does have history of cirrhosis which may be contributing to her lactate elevation. [PR]    Clinical Course User Index [PR] Merlyn Lot, MD      FINAL CLINICAL IMPRESSION(S) / ED DIAGNOSES   Final diagnoses:  Sepsis, due to unspecified organism, unspecified whether acute organ dysfunction present [facility])  Traumatic rhabdomyolysis, initial encounter ([place])  Dehydration     Rx / DC Orders   ED Discharge Orders     None        Note:  This document was prepared using Dragon voice recognition software and may include unintentional dictation errors.    Merlyn Lot, MD 06/17/22 [phone]

## 2022-06-17 NOTE — ED Notes (Signed)
Pt taken to ct via stretcher with ct tech

## 2022-06-17 NOTE — Sepsis Progress Note (Signed)
eLink is following this Code Sepsis. °

## 2022-06-17 NOTE — Assessment & Plan Note (Signed)
Patient has a history of atrial fibrillation, presenting with RVR in the setting of profound dehydration and missed metoprolol doses.  Rates have improved with rehydration  - Restart home metoprolol and Eliquis

## 2022-06-17 NOTE — Assessment & Plan Note (Addendum)
Patient continues to use alcohol daily, however states she only drinks 2 beers.  No ascites seen on CT imaging  - Holding home Lasix and spironolactone given dehydration

## 2022-06-17 NOTE — H&P (Signed)
History and Physical    Patient: Adriana Miller [id number] DOB: 1955/11/17 DOA: 06/17/2022 DOS: the patient was seen and examined on 06/17/2022 PCP: Mylinda Latina, PA-C  Patient coming from: Home  Chief Complaint:  Chief Complaint  Patient presents with   Fall   Weakness   Tachycardia   HPI: Adriana Miller is a 67 y.o. 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  [name] states that she was in her usual state of health yesterday evening when she was walking to her bathroom, and lost her balance.  She states that she was not experiencing any dizziness, vision changes, headache, chest pain, shortness of breath or palpitations prior to the fall or afterwards.  She did hit her face on the ground but denies any loss of consciousness.  Due to generalized weakness, she had difficulty getting up off the ground and ended up laying on her right side for the entire night.  Due to this, she is experiencing significant right arm and leg soreness.  She believes she hit her eye on the ground as her eye feels very irritated.  At this time, she denies any other symptoms including fever, chills, nausea, vomiting, diarrhea, dysuria, urinary frequency, abdominal pain, abdominal distention, chest pain, shortness of breath, palpitations.  She denies any focal weakness.  Patient states she had 2 beers yesterday.  She endorses drinking 1-2 beers every day.  ED course: On arrival to the ED, patient was hypertensive at 138/98 with heart rate of 117.  She was saturating at 100% on room air.  She was afebrile at 97.6.  Initial workup remarkable for WBC of 7.7, hemoglobin of 14.4, platelets of 139, potassium 4.0, bicarb 18, creatinine 1.14, anion gap of 20,  AST 88, and GFR 52.  CK elevated at 1700.  Lactic acid elevated at 8.3 with downtrend to 5.7.  INR elevated at 1.4.  Urinalysis with ketones, hematuria, proteinuria, small leukocytes and few bacteria.  CT of the head, C-spine, maxillofacial and abdomen/pelvis were obtained; no acute abnormalities noted.  X-rays of the chest, forearm, humerus and knee were obtained; Notable for low lung volumes with atelectasis and vascular crowding, however no other acute abnormalities noted.  Due to initial concerns for underlying infection, patient started on broad-spectrum antibiotic and IV fluids.  Due to rhabdomyolysis and lactic acidosis, [facility] contacted for admission.  Review of Systems: As mentioned in the history of present illness. All other systems reviewed and are negative.  Past Medical History:  Diagnosis Date   Alcoholic cirrhosis of liver without ascites ([place])    B12 deficiency 04/05/2017   Blood in stool    Cerebral aneurysm    Chronic kidney disease    Diabetes mellitus without complication ([place])    type 2   Gastric erosion with bleeding    Hepatocellular carcinoma ([place])    Hyperlipidemia    Hypertension    Lower extremity cellulitis 02/08/2019   Paroxysmal A-fib ([facility])    Seizure disorder [facility])    Past Surgical History:  Procedure Laterality Date   CEREBRAL ANEURYSM REPAIR  1991   COLONOSCOPY WITH PROPOFOL N/A 02/12/2019   Procedure: COLONOSCOPY WITH PROPOFOL;  Surgeon: Lin Landsman, MD;  Location: [facility] ENDOSCOPY;  Service: Gastroenterology;  Laterality: N/A;   ESOPHAGOGASTRODUODENOSCOPY (EGD) WITH PROPOFOL N/A 02/12/2019  Procedure: ESOPHAGOGASTRODUODENOSCOPY (EGD) WITH PROPOFOL;  Surgeon: Lin Landsman, MD;  Location: [facility] ENDOSCOPY;  Service: Gastroenterology;  Laterality: N/A;   IR ANGIOGRAM SELECTIVE EACH ADDITIONAL VESSEL  09/21/2019   IR ANGIOGRAM SELECTIVE EACH ADDITIONAL VESSEL  09/21/2019   IR ANGIOGRAM SELECTIVE EACH ADDITIONAL VESSEL  09/21/2019   IR ANGIOGRAM  SELECTIVE EACH ADDITIONAL VESSEL  09/21/2019   IR ANGIOGRAM SELECTIVE EACH ADDITIONAL VESSEL  10/02/2019   IR ANGIOGRAM VISCERAL SELECTIVE  09/21/2019   IR ANGIOGRAM VISCERAL SELECTIVE  09/21/2019   IR ANGIOGRAM VISCERAL SELECTIVE  10/02/2019   IR EMBO TUMOR ORGAN ISCHEMIA INFARCT INC GUIDE ROADMAPPING  10/02/2019   IR EMBO TUMOR ORGAN ISCHEMIA INFARCT INC GUIDE ROADMAPPING  10/02/2019   IR RADIOLOGIST EVAL & MGMT  08/28/2019   IR RADIOLOGIST EVAL & MGMT  01/30/2020   IR RADIOLOGIST EVAL & MGMT  04/30/2020   IR RADIOLOGIST EVAL & MGMT  07/30/2020   IR RADIOLOGIST EVAL & MGMT  10/28/2020   IR RADIOLOGIST EVAL & MGMT  02/24/2021   IR RADIOLOGIST EVAL & MGMT  06/08/2021   IR RADIOLOGIST EVAL & MGMT  11/24/2021   IR RADIOLOGIST EVAL & MGMT  05/25/2022   IR US GUIDE VASC ACCESS LEFT  09/21/2019   IR US GUIDE VASC ACCESS LEFT  10/02/2019   OPEN REDUCTION INTERNAL FIXATION (ORIF) DISTAL RADIAL FRACTURE Right 08/06/2021   Procedure: OPEN REDUCTION INTERNAL FIXATION (ORIF) DISTAL RADIAL FRACTURE;  Surgeon: Hessie Knows, MD;  Location: [facility] ORS;  Service: Orthopedics;  Laterality: Right;   Social History:  reports that she has never smoked. Her smokeless tobacco use includes snuff. She reports current alcohol use of about 14.0 standard drinks of alcohol per week. She reports that she does not use drugs.  Allergies  Allergen Reactions   Aspirin Other (See Comments)    Nose bleed    Family History  Problem Relation Age of Onset   Heart attack Brother 52    Prior to Admission medications   Medication Sig Start Date End Date Taking? Authorizing Provider  allopurinol (ZYLOPRIM) 100 MG tablet TAKE 1 TABLET BY MOUTH DAILY 05/17/22  Yes McDonough, Lauren K, PA-C  Calcium Carb-Cholecalciferol (CALCIUM 500/D) 500-5 MG-MCG TABS Take 1 tab po daily 12/07/21  Yes McDonough, Lauren K, PA-C  ELIQUIS 5 MG TABS tablet TAKE 1 TABLET BY MOUTH TWICE A DAY Patient taking differently: Take 5 mg by mouth 2 (two) times daily. 05/14/22   Yes End, Harrell Gave, MD  ergocalciferol (VITAMIN D2) 1.25 MG (50000 UT) capsule Take 1 capsule (50,000 Units total) by mouth once a week. 05/13/22  Yes McDonough, Lauren K, PA-C  escitalopram (LEXAPRO) 10 MG tablet TAKE 1 TABLET (10 MG TOTAL) BY MOUTH DAILY. FOR ANXIETY/SLEEP 03/16/22  Yes McDonough, Lauren K, PA-C  furosemide (LASIX) 20 MG tablet TAKE 1 TABLET (20 MG TOTAL) BY MOUTH DAILY. NEED APPT FOR MORE REFILLS 05/07/22  Yes End, Harrell Gave, MD  glimepiride (AMARYL) 1 MG tablet TAKE 1 TABLET BY MOUTH EVERY DAY WITH BREAKFAST Patient taking differently: Take 1 mg by mouth daily with breakfast. 01/21/22  Yes McDonough, Lauren K, PA-C  insulin glargine, 1 Unit Dial, (TOUJEO SOLOSTAR) 300 UNIT/ML Solostar Pen INJECT 30 UNITS SUBCUTANEOUSLY IN THE EVENING WITH FOOD Patient taking differently: Inject 28 Units into the skin daily with supper. INJECT 30 UNITS SUBCUTANEOUSLY IN THE EVENING WITH FOOD 01/21/22  Yes McDonough, Lauren K, PA-C  metoprolol tartrate (LOPRESSOR) 25 MG tablet TAKE 0.5 TABLETS BY MOUTH 2 TIMES DAILY. Patient taking differently:  Take 12.5 mg by mouth 2 (two) times daily. 01/20/22  Yes McDonough, Lauren K, PA-C  mirtazapine (REMERON) 7.5 MG tablet TAKE 1 TABLET BY MOUTH EVERYDAY AT BEDTIME Patient taking differently: Take 7.5 mg by mouth at bedtime. 01/21/22  Yes McDonough, Si Gaul, PA-C  spironolactone (ALDACTONE) 25 MG tablet Take 1 tablet (25 mg total) by mouth daily. For cirrhosis of liver 01/20/22  Yes McDonough, Lauren K, PA-C  traZODone (DESYREL) 50 MG tablet TAKE 1 TO 2 TABLETS BY MOUTH AT  BEDTIME FOR INSOMNIA Patient taking differently: Take 50-100 mg by mouth at bedtime. 05/24/22  Yes McDonough, Si Gaul, PA-C  Alcohol Swabs (B-D SINGLE USE SWABS REGULAR) PADS Use as directed  twice daily Patient not taking: Reported on 05/14/2022 04/15/22   McDonough, Si Gaul, PA-C  BD PEN NEEDLE NANO 2ND GEN 32G X 4 MM MISC USE AS DIRECTED WITH TOUJEO Patient not taking: Reported on  05/14/2022 07/25/21   Mylinda Latina, PA-C  Blood Glucose Monitoring Suppl (ACCU-CHEK GUIDE ME) w/Device KIT Use as directed  dx E11.65 Patient not taking: Reported on 05/14/2022 11/09/21   Mylinda Latina, PA-C  Continuous Blood Gluc Receiver (DEXCOM G7 RECEIVER) DEVI Use for continuous blood glucose monitoring  DX E11.65 Patient not taking: Reported on 05/14/2022 11/09/21   Mylinda Latina, PA-C  Continuous Blood Gluc Sensor (DEXCOM G7 SENSOR) MISC Use as directed for continuous glucose monitoring.  Change sensor every 10 days   E11.65 11/09/21   McDonough, Ander Purpura K, PA-C  glucose blood (ACCU-CHEK GUIDE) test strip USE AS INSTRUCTED ONCE A DAILY 03/16/22   McDonough, Si Gaul, PA-C  HYDROcodone-acetaminophen (NORCO) 5-325 MG tablet Take 1 tablet by mouth every 6 (six) hours as needed for moderate pain. Patient not taking: Reported on 06/17/2022 08/06/21   Hessie Knows, MD  potassium chloride (KLOR-CON) 8 MEQ tablet Take 1 tablet (8 mEq total) by mouth daily. 06/15/22   Rise Mu, PA-C  Safety Lancets 28G MISC Use as directed once a daily Dx e11.65 02/04/20   Luiz Ochoa, NP  traMADol (ULTRAM) 50 MG tablet Take 50 mg by mouth every 6 (six) hours as needed. Patient not taking: Reported on 06/17/2022    [provider]    Physical Exam: Vitals:   06/17/22 1700 06/17/22 1800 06/17/22 1900 06/17/22 2000  BP: (!) 166/98 (!) 171/113 (!) 174/110 (!) 173/99  Pulse: (!) 124   (!) 126  Resp: (!) '26 19 12 '$ (!) 24  Temp: 98 F (36.7 C)     TempSrc:      SpO2: 98%   100%  Weight:      Height:       Physical Exam Vitals and nursing note reviewed.  Constitutional:      General: She is not in acute distress.    Appearance: She is obese.  HENT:     Head: Normocephalic.     Comments: Small contusion to the right forehead with no laceration or tenderness to palpation    Mouth/Throat:     Pharynx: Oropharynx is clear.     Comments: Extremely dry oropharynx.  No edema  noted. Eyes:     General:        Right eye: Discharge (Right eye with significant purulent drainage) present.        Left eye: Discharge (Scant purulent drainage noted.) present.    Extraocular Movements: Extraocular movements intact.     Conjunctiva/sclera:     Right eye: Right conjunctiva is injected.  Left eye: Left conjunctiva is not injected.     Pupils: Pupils are equal, round, and reactive to light.     Visual Fields: Right eye visual fields normal and left eye visual fields normal.  Cardiovascular:     Rate and Rhythm: Tachycardia present. Rhythm irregular.     Heart sounds: No murmur heard. Pulmonary:     Effort: Pulmonary effort is normal. No tachypnea or respiratory distress.     Breath sounds: Transmitted upper airway sounds present. Rhonchi (Intermittent, bilateral) present.  Abdominal:     General: Bowel sounds are normal. There is no distension.     Palpations: Abdomen is soft.     Tenderness: There is no abdominal tenderness. There is no guarding.  Musculoskeletal:        General: Tenderness (Tenderness to palpation along the right arm diffusely and left thigh) present.     Right lower leg: No edema.     Left lower leg: No edema.  Skin:    General: Skin is warm and dry.  Neurological:     Mental Status: She is alert.     Comments:  No dysarthria or facial asymmetry. Visual fields intact.  Sensation intact throughout 5/5 strength intact throughout although somewhat limited by pain on the right side.  Alert and oriented x3.   Psychiatric:        Mood and Affect: Mood normal.        Behavior: Behavior normal.    Data Reviewed: CBC with WBC of 7.7, hemoglobin of 14.4, platelets of 139 CMP with sodium of 144, potassium 4.0, bicarb 18, BUN 13, creatinine 1.14, calcium 8.2, anion gap 20, AST 88, total protein 8.4 and GFR 52 CK elevated at 1717 Troponin negative at 12 Lactic acid elevated at 8.3 with downtrend to 5.7 INR elevated at 1.4 PTT within normal  limits at 30 Influenza, RSV and COVID-19 negative Urinalysis with moderate hemoglobin, ketonuria, small leukocytes, proteinuria and few bacteria  EKG personally reviewed.  Atrial fibrillation with rate of 124.  No concerning ST or T wave changes for acute ischemia  CT ABDOMEN PELVIS W CONTRAST  Result Date: 06/17/2022 CLINICAL DATA:  Patient fell going to the bedroom from the bathroom. History of cirrhosis. EXAM: CT ABDOMEN AND PELVIS WITH CONTRAST TECHNIQUE: Multidetector CT imaging of the abdomen and pelvis was performed using the standard protocol following bolus administration of intravenous contrast. RADIATION DOSE REDUCTION: This exam was performed according to the departmental dose-optimization program which includes automated exposure control, adjustment of the mA and/or kV according to patient size and/or use of iterative reconstruction technique. CONTRAST:  177m OMNIPAQUE IOHEXOL 300 MG/ML  SOLN COMPARISON:  CT 05/09/2019, MRI 05/17/2022 FINDINGS: Lower chest: Lung bases are unremarkable. Coronary artery calcifications are present. Mitral calcification is present. Hepatobiliary: Cirrhotic morphology of the liver. There is diffuse low-attenuation of the liver consistent with hepatic steatosis. Multiple hepatic lesions are better seen on recent MRI. Gallbladder is present and contains numerous layering stones. Pancreas: Unremarkable. No pancreatic ductal dilatation or surrounding inflammatory changes. Spleen: Small low-attenuation lesion in the LATERAL aspect of the spleen is stable in appearance. Adrenals/Urinary Tract: Adrenal glands are normal. Intrarenal calculus in the UPPER pole the RIGHT kidney is 2 millimeters. Benign cyst in the UPPER pole the LEFT kidney is 1.2 centimeters. No specific imaging follow-up is needed for this finding. No hydronephrosis. The bladder and visualized portion of the urethra are normal. Stomach/Bowel: Stomach and small bowel loops are normal in appearance. There are  scattered  colonic diverticula. No acute diverticulitis. There is fat deposition within the wall of the colon, particular involving the ascending segment, consistent with chronic inflammatory changes. No evidence for acute inflammatory abnormality. The appendix is not seen. Vascular/Lymphatic: There is dense atherosclerotic calcification of the aorta and aortic branches. No retroperitoneal or mesenteric adenopathy. Reproductive: Uterus is present.  No adnexal mass. Other: Abdominal wall is unremarkable.  There is no ascites. Musculoskeletal: There are degenerative changes of both hips and LOWER lumbar spine. No acute fracture or subluxation. IMPRESSION: 1. No acute abnormality of the abdomen or pelvis. 2. Cirrhosis and hepatic steatosis of the liver. Multiple hepatic lesions are better seen on recent MRI. 3. Cholelithiasis. 4. Nonobstructing intrarenal calculus in the UPPER pole the RIGHT kidney. 5. Colonic diverticulosis without acute diverticulitis. 6. Fat deposition within the wall of the colon, consistent with chronic inflammatory changes. 7.  Aortic Atherosclerosis (ICD10-I70.0). Electronically Signed   By: Nolon Nations M.D.   On: 06/17/2022 15:51   DG Chest Port 1 View  Result Date: 06/17/2022 CLINICAL DATA:  Sepsis EXAM: PORTABLE CHEST 1 VIEW COMPARISON:  Chest x-ray 07/25/2019 FINDINGS: The cardiac silhouette, mediastinal and hilar contours are within normal limits. Low lung volumes with vascular crowding and streaky areas of atelectasis but no infiltrates, edema or effusions. No pneumothorax. The bony thorax is intact. IMPRESSION: Low lung volumes with vascular crowding and streaky areas of atelectasis. Electronically Signed   By: Marijo Sanes M.D.   On: 06/17/2022 14:47   DG Forearm Right  Result Date: 06/17/2022 CLINICAL DATA:  [place].  Right forearm pain. EXAM: RIGHT FOREARM - 2 VIEW COMPARISON:  Prior wrist radiographs from 07/23/2021 FINDINGS: Remote posttraumatic and postsurgical changes  involving wrist with a volar plate and screws transfixing the distal radius fracture which is healed. Remote avulsion fracture involving the ulnar styloid. The elbow joint is maintained. There is a moderate-sized olecranon spur noted. There is also a rounded ossified density near the lateral epicondyle which could be an old avulsion fracture or calcific tendinopathy. No acute forearm fracture. IMPRESSION: 1. Remote posttraumatic and postsurgical changes involving the wrist. 2. No acute bony findings. 3. Moderate-sized olecranon spur. Electronically Signed   By: Marijo Sanes M.D.   On: 06/17/2022 14:46   DG Knee Complete 4 Views Right  Result Date: 06/17/2022 CLINICAL DATA:  [place].  Right knee pain. EXAM: RIGHT KNEE - COMPLETE 4+ VIEW COMPARISON:  None Available. FINDINGS: The joint spaces are maintained. No acute fracture or osteochondral lesion. Mild chondrocalcinosis. No joint effusion. Advanced vascular calcifications for age. IMPRESSION: 1. No acute bony findings or joint effusion. 2. Mild chondrocalcinosis. 3. Advanced vascular calcifications for age. Electronically Signed   By: Marijo Sanes M.D.   On: 06/17/2022 14:44   DG Humerus Right  Result Date: 06/17/2022 CLINICAL DATA:  [place].  Right arm pain. EXAM: RIGHT HUMERUS - 2+ VIEW COMPARISON:  None Available. FINDINGS: Moderate glenohumeral and AC joint degenerative changes. There is significant lateral downsloping of the acromion and narrowing of the humeroacromial space. No acute fracture of the humerus is identified. The elbow joint is intact. IMPRESSION: 1. No acute bony findings. 2. Moderate glenohumeral and AC joint degenerative changes. Electronically Signed   By: Marijo Sanes M.D.   On: 06/17/2022 14:43   DG Hip Unilat W or Wo Pelvis 2-3 Views Right  Result Date: 06/17/2022 CLINICAL DATA:  Sepsis.  Fell. EXAM: DG HIP (WITH OR WITHOUT PELVIS) 2-3V RIGHT COMPARISON:  None Available. FINDINGS: Both hips are normally located.  Advanced bilateral  hip joint degenerative changes but no fracture or plain film evidence of AVN. The pubic symphysis and SI joints are intact. No pelvic fractures or bone lesions. Chondrocalcinosis is noted. Extensive vascular calcifications, advanced for age. IMPRESSION: 1. Advanced bilateral hip joint degenerative changes but no acute bony findings. 2. Extensive vascular calcifications, advanced for age. Electronically Signed   By: Marijo Sanes M.D.   On: 06/17/2022 14:42   CT HEAD WO CONTRAST (5MM)  Result Date: 06/17/2022 CLINICAL DATA:  Trauma.  Fall in bathroom EXAM: CT HEAD WITHOUT CONTRAST CT MAXILLOFACIAL WITHOUT CONTRAST CT CERVICAL SPINE WITHOUT CONTRAST TECHNIQUE: Multidetector CT imaging of the head, cervical spine, and maxillofacial structures were performed using the standard protocol without intravenous contrast. Multiplanar CT image reconstructions of the cervical spine and maxillofacial structures were also generated. RADIATION DOSE REDUCTION: This exam was performed according to the departmental dose-optimization program which includes automated exposure control, adjustment of the mA and/or kV according to patient size and/or use of iterative reconstruction technique. COMPARISON:  08/12/2021 FINDINGS: CT HEAD FINDINGS Brain: No acute intracranial hemorrhage. No focal mass lesion. No CT evidence of acute infarction. No midline shift or mass effect. No hydrocephalus. Basilar cisterns are patent. There are periventricular and subcortical white matter hypodensities. Generalized cortical atrophy. Vascular: No hyperdense vessel or unexpected calcification. Skull: Remote RIGHT craniotomy.  No acute fracture Sinuses/Orbits: Paranasal sinuses and mastoid air cells are clear. Orbits are clear. Other: None. CT MAXILLOFACIAL FINDINGS Osseous: No fracture or mandibular dislocation. No destructive process. Orbits: Negative. No traumatic or inflammatory finding. Sinuses: Clear. Soft tissues: Negative. CT CERVICAL SPINE  FINDINGS Alignment: Exaggerated lordosis Skull base and vertebrae: Normal craniocervical junction. No loss of vertebral body height or disc height. Normal facet articulation. No evidence of fracture. Soft tissues and spinal canal: No prevertebral soft tissue swelling. No perispinal or epidural hematoma. Disc levels:  Bulky osteophytosis from T4-T7.  No acute findings. Upper chest: Clear Other: None IMPRESSION: 1. No acute intracranial findings. 2. Chronic atrophy and white matter microvascular disease. 3. Prior RIGHT frontal craniotomy. 4. No facial bone fracture. 5. Exaggerated cervical lordosis. 6. No cervical spine fracture. 7.  Degenerative osteophytosis of the spine. Electronically Signed   By: Suzy Bouchard M.D.   On: 06/17/2022 14:32   CT Cervical Spine Wo Contrast  Result Date: 06/17/2022 CLINICAL DATA:  Trauma.  Fall in bathroom EXAM: CT HEAD WITHOUT CONTRAST CT MAXILLOFACIAL WITHOUT CONTRAST CT CERVICAL SPINE WITHOUT CONTRAST TECHNIQUE: Multidetector CT imaging of the head, cervical spine, and maxillofacial structures were performed using the standard protocol without intravenous contrast. Multiplanar CT image reconstructions of the cervical spine and maxillofacial structures were also generated. RADIATION DOSE REDUCTION: This exam was performed according to the departmental dose-optimization program which includes automated exposure control, adjustment of the mA and/or kV according to patient size and/or use of iterative reconstruction technique. COMPARISON:  08/12/2021 FINDINGS: CT HEAD FINDINGS Brain: No acute intracranial hemorrhage. No focal mass lesion. No CT evidence of acute infarction. No midline shift or mass effect. No hydrocephalus. Basilar cisterns are patent. There are periventricular and subcortical white matter hypodensities. Generalized cortical atrophy. Vascular: No hyperdense vessel or unexpected calcification. Skull: Remote RIGHT craniotomy.  No acute fracture Sinuses/Orbits:  Paranasal sinuses and mastoid air cells are clear. Orbits are clear. Other: None. CT MAXILLOFACIAL FINDINGS Osseous: No fracture or mandibular dislocation. No destructive process. Orbits: Negative. No traumatic or inflammatory finding. Sinuses: Clear. Soft tissues: Negative. CT CERVICAL SPINE FINDINGS Alignment: Exaggerated lordosis Skull base and  vertebrae: Normal craniocervical junction. No loss of vertebral body height or disc height. Normal facet articulation. No evidence of fracture. Soft tissues and spinal canal: No prevertebral soft tissue swelling. No perispinal or epidural hematoma. Disc levels:  Bulky osteophytosis from T4-T7.  No acute findings. Upper chest: Clear Other: None IMPRESSION: 1. No acute intracranial findings. 2. Chronic atrophy and white matter microvascular disease. 3. Prior RIGHT frontal craniotomy. 4. No facial bone fracture. 5. Exaggerated cervical lordosis. 6. No cervical spine fracture. 7.  Degenerative osteophytosis of the spine. Electronically Signed   By: Suzy Bouchard M.D.   On: 06/17/2022 14:32   CT MAXILLOFACIAL WO CONTRAST  Result Date: 06/17/2022 CLINICAL DATA:  Trauma.  Fall in bathroom EXAM: CT HEAD WITHOUT CONTRAST CT MAXILLOFACIAL WITHOUT CONTRAST CT CERVICAL SPINE WITHOUT CONTRAST TECHNIQUE: Multidetector CT imaging of the head, cervical spine, and maxillofacial structures were performed using the standard protocol without intravenous contrast. Multiplanar CT image reconstructions of the cervical spine and maxillofacial structures were also generated. RADIATION DOSE REDUCTION: This exam was performed according to the departmental dose-optimization program which includes automated exposure control, adjustment of the mA and/or kV according to patient size and/or use of iterative reconstruction technique. COMPARISON:  08/12/2021 FINDINGS: CT HEAD FINDINGS Brain: No acute intracranial hemorrhage. No focal mass lesion. No CT evidence of acute infarction. No midline shift  or mass effect. No hydrocephalus. Basilar cisterns are patent. There are periventricular and subcortical white matter hypodensities. Generalized cortical atrophy. Vascular: No hyperdense vessel or unexpected calcification. Skull: Remote RIGHT craniotomy.  No acute fracture Sinuses/Orbits: Paranasal sinuses and mastoid air cells are clear. Orbits are clear. Other: None. CT MAXILLOFACIAL FINDINGS Osseous: No fracture or mandibular dislocation. No destructive process. Orbits: Negative. No traumatic or inflammatory finding. Sinuses: Clear. Soft tissues: Negative. CT CERVICAL SPINE FINDINGS Alignment: Exaggerated lordosis Skull base and vertebrae: Normal craniocervical junction. No loss of vertebral body height or disc height. Normal facet articulation. No evidence of fracture. Soft tissues and spinal canal: No prevertebral soft tissue swelling. No perispinal or epidural hematoma. Disc levels:  Bulky osteophytosis from T4-T7.  No acute findings. Upper chest: Clear Other: None IMPRESSION: 1. No acute intracranial findings. 2. Chronic atrophy and white matter microvascular disease. 3. Prior RIGHT frontal craniotomy. 4. No facial bone fracture. 5. Exaggerated cervical lordosis. 6. No cervical spine fracture. 7.  Degenerative osteophytosis of the spine. Electronically Signed   By: Suzy Bouchard M.D.   On: 06/17/2022 14:32    There are no new results to review at this time.  Assessment and Plan:  * Rhabdomyolysis Patient presenting after ground-level fall that was mechanical in nature, however she was unable to stand up due to generalized weakness.  CK elevated at 1700.  No evidence of renal dysfunction at this time.  - S/p 3 L bolus - Continue gentle hydration overnight - Monitor for evidence of hypervolemia given history of cirrhosis and heart failure - PT/OT  Lactic acidosis On presentation, patient's lactic acid was markedly elevated at 8.  Likely multifactorial in the setting of rhabdomyolysis and  significant dehydration.  Initial concern for infection however patient is afebrile with no leukocytosis, and tachycardia can be explained by A-fib with RVR.  Urinalysis demonstrates few bacteria, however patient denies any urinary symptoms including dysuria, frequency or urgency.  No ascites on imaging to suggest SBP. Given history of cirrhosis, suspect lactic acid will take longer to clear  - IV fluid hydration as noted above - Continue to trend lactic acid - Follow-up blood  cultures  Atrial fibrillation with RVR ([place]) Patient has a history of atrial fibrillation, presenting with RVR in the setting of profound dehydration and missed metoprolol doses.  Rates have improved with rehydration  - Restart home metoprolol and Eliquis  Alcoholic cirrhosis of liver without ascites ([place]) Patient continues to use alcohol daily, however states she only drinks 2 beers.  No ascites seen on CT imaging  - Holding home Lasix and spironolactone given dehydration  Bacterial conjunctivitis Patient's right conjunctiva significantly injected with purulent drainage concerning for bacterial conjunctivitis.  There is some drainage from the left eye as well but scant amounts.  No visual deficits on examination  - Erythromycin ointment every 6 hours  Alcohol abuse with other alcohol-induced disorder [facility]) Patient states her last drink was yesterday.  - CIWA with Ativan as needed - Daily thiamine and folic acid  Chronic heart failure with preserved ejection fraction (HFpEF) ([place]) Patient's last echocardiogram was in 2020 with a EF of 55-60%.  Patient appears hypovolemic at this time.  -Holding home Lasix  Type 2 diabetes mellitus ([place]) - Hold home antiglycemic agents - SSI, moderate  History of seizure Patient is not currently on any AEDs.  Hepatocellular carcinoma ([place]) History of stage II HCC  s/p radiation segmentectomy.  Last MRI in January 2024 with interval increase of lesion in the anterior right  lobe.  Patient following with palliative care and IR.  Advance Care Planning:   Code Status: DNR/DNI.  Verified by patient.  Corroborated by patient's brother via telephone  Consults: None  Family Communication: Patient's brother updated via telephone  Severity of Illness: The appropriate patient status for this patient is INPATIENT. Inpatient status is judged to be reasonable and necessary in order to provide the required intensity of service to ensure the patient's safety. The patient's presenting symptoms, physical exam findings, and initial radiographic and laboratory data in the context of their chronic comorbidities is felt to place them at high risk for further clinical deterioration. Furthermore, it is not anticipated that the patient will be medically stable for discharge from the hospital within 2 midnights of admission.   * I certify that at the point of admission it is my clinical judgment that the patient will require inpatient hospital care spanning beyond 2 midnights from the point of admission due to high intensity of service, high risk for further deterioration and high frequency of surveillance required.*  Author: Jose Persia, MD 06/17/2022 8:35 PM  For on call review [email or web address].

## 2022-06-17 NOTE — ED Triage Notes (Signed)
Pt arrives via ems from home, pt states that she is always off balance and fell going to her bedroom from the bathroom last pm, pt states that she was finally able to roll to her emergency call button today, pt is on blood thinners, pt denies loc but did hit her face, pt has swelling to her right side of her face. Per ems she had a blood sugar of  149. Pt states that she did have 2 beers yesterday, states that she has liver disease. Ems reports that she had some mottling to her right arm and leg, pos hx of neuropathy, denies hx of stroke

## 2022-06-17 NOTE — ED Notes (Signed)
Informed rn bed assigned

## 2022-06-17 NOTE — Assessment & Plan Note (Addendum)
On presentation, patient's lactic acid was markedly elevated at 8.  Likely multifactorial in the setting of rhabdomyolysis and significant dehydration.  Initial concern for infection however patient is afebrile with no leukocytosis, and tachycardia can be explained by A-fib with RVR.  Urinalysis demonstrates few bacteria, however patient denies any urinary symptoms including dysuria, frequency or urgency.  No ascites on imaging to suggest SBP. Given history of cirrhosis, suspect lactic acid will take longer to clear  - IV fluid hydration as noted above - Continue to trend lactic acid - Follow-up blood cultures

## 2022-06-17 NOTE — Progress Notes (Signed)
CODE SEPSIS - PHARMACY COMMUNICATION  **Broad Spectrum Antibiotics should be administered within 1 hour of Sepsis diagnosis**  Time Code Sepsis Called/Page Received: 1448  Antibiotics Ordered: vancomycin 1,000 mg and cefepime 2 grams  Time of 1st antibiotic administration: 1501  Additional action taken by pharmacy: none  If necessary, Name of Provider/Nurse Contacted: [place] ,PharmD Clinical Pharmacist  06/17/2022  2:52 PM

## 2022-06-17 NOTE — Assessment & Plan Note (Signed)
-   Hold home antiglycemic agents - SSI, moderate

## 2022-06-17 NOTE — Assessment & Plan Note (Signed)
Patient's last echocardiogram was in 2020 with a EF of 55-60%.  Patient appears hypovolemic at this time.  -Holding home Lasix

## 2022-06-18 ENCOUNTER — Encounter: Payer: Self-pay | Admitting: Internal Medicine

## 2022-06-18 DIAGNOSIS — T796XXA Traumatic ischemia of muscle, initial encounter: Secondary | ICD-10-CM | POA: Diagnosis not present

## 2022-06-18 DIAGNOSIS — E860 Dehydration: Secondary | ICD-10-CM

## 2022-06-18 LAB — COMPREHENSIVE METABOLIC PANEL
ALT: 24 U/L (ref 0–44)
AST: 73 U/L — ABNORMAL HIGH (ref 15–41)
Albumin: 2.5 g/dL — ABNORMAL LOW (ref 3.5–5.0)
Alkaline Phosphatase: 54 U/L (ref 38–126)
Anion gap: 9 (ref 5–15)
BUN: 12 mg/dL (ref 8–23)
CO2: 25 mmol/L (ref 22–32)
Calcium: 6.8 mg/dL — ABNORMAL LOW (ref 8.9–10.3)
Chloride: 104 mmol/L (ref 98–111)
Creatinine, Ser: 1.04 mg/dL — ABNORMAL HIGH (ref 0.44–1.00)
GFR, Estimated: 59 mL/min — ABNORMAL LOW (ref >60)
Glucose, Bld: 128 mg/dL — ABNORMAL HIGH (ref 70–99)
Potassium: 3.1 mmol/L — ABNORMAL LOW (ref 3.5–5.1)
Sodium: 138 mmol/L (ref 135–145)
Total Bilirubin: 1.2 mg/dL (ref 0.3–1.2)
Total Protein: 6.4 g/dL — ABNORMAL LOW (ref 6.5–8.1)

## 2022-06-18 LAB — C DIFFICILE QUICK SCREEN W PCR REFLEX
C Diff antigen: NEGATIVE
C Diff interpretation: NOT DETECTED
C Diff toxin: NEGATIVE

## 2022-06-18 LAB — CBC
HCT: 35.3 % — ABNORMAL LOW (ref 36.0–46.0)
Hemoglobin: 11.6 g/dL — ABNORMAL LOW (ref 12.0–15.0)
MCH: 32 pg (ref 26.0–34.0)
MCHC: 32.9 g/dL (ref 30.0–36.0)
MCV: 97.5 fL (ref 80.0–100.0)
Platelets: 90 10*3/uL — ABNORMAL LOW (ref 150–400)
RBC: 3.62 MIL/uL — ABNORMAL LOW (ref 3.87–5.11)
RDW: 14.1 % (ref 11.5–15.5)
WBC: 5.5 10*3/uL (ref 4.0–10.5)
nRBC: 0 % (ref 0.0–0.2)

## 2022-06-18 LAB — GLUCOSE, CAPILLARY
Glucose-Capillary: 95 mg/dL (ref 70–99)
Glucose-Capillary: 130 mg/dL — ABNORMAL HIGH (ref 70–99)
Glucose-Capillary: 97 mg/dL (ref 70–99)
Glucose-Capillary: 190 mg/dL — ABNORMAL HIGH (ref 70–99)

## 2022-06-18 LAB — PROTIME-INR
INR: 1.8 — ABNORMAL HIGH (ref 0.8–1.2)
Prothrombin Time: 20.6 seconds — ABNORMAL HIGH (ref 11.4–15.2)

## 2022-06-18 LAB — LACTIC ACID, PLASMA: Lactic Acid, Venous: 2.6 mmol/L (ref 0.5–1.9)

## 2022-06-18 LAB — HIV ANTIBODY (ROUTINE TESTING W REFLEX): HIV Screen 4th Generation wRfx: NONREACTIVE

## 2022-06-18 MED ORDER — POTASSIUM CHLORIDE 20 MEQ PO PACK
40.0000 meq | PACK | Freq: Once | ORAL | Status: AC
  Administered 2022-06-18: 40 meq via ORAL
  Filled 2022-06-18: qty 2

## 2022-06-18 MED ORDER — METOPROLOL TARTRATE 5 MG/5ML IV SOLN
5.0000 mg | Freq: Once | INTRAVENOUS | Status: AC
  Administered 2022-06-18: 5 mg via INTRAVENOUS
  Filled 2022-06-18: qty 5

## 2022-06-18 NOTE — Evaluation (Signed)
Physical Therapy Evaluation Patient Details Name: Adriana Miller MRN: [id number] DOB: October 03, 1955 Today's Date: 06/18/2022  History of Present Illness  Pt is a 67 y.o. female presenting to hospital 06/17/22 s/p fall hitting her face (found down >24 hours at home); c/o R arm and leg soreness.  Pt admitted with rhabdomyolysis, lactic acidosis, a-fib with RVR, and bacterial conjunctivitis.  PMH includes R frontal craniotomy, alcoholic cirrhosis, brain aneurysm, DM, a-fib, seizure disorder, hepatocellular carcinoma, CKD, ORIF distal R radial fx 08/06/21.  Clinical Impression  Prior to hospital admission, pt was ambulatory (no AD use in house; uses [facility] outside); lives alone in 1 level apt (level entry); has aide that comes in to assist pt.  Pt reporting R thigh pain during session (from previous fall/laying on ground) but pain did not increase with activity.  Currently pt is SBA with bed mobility; CGA with transfers (bed height elevated to simulate home set up heights--pt reports she does not sit on low surfaces at home baseline d/t difficulties standing from lower surfaces); and CGA to take steps in place with RW use (unable to ambulate away from bed d/t IV pole attached to bed).  Pt does demonstrate generalized weakness and decreased activity tolerance from baseline.  Pt would currently benefit from skilled PT to address noted impairments and functional limitations (see below for any additional details).  Upon hospital discharge, pt would benefit from ongoing therapy (pt requesting home discharge).    Recommendations for follow up therapy are one component of a multi-disciplinary discharge planning process, led by the attending physician.  Recommendations may be updated based on patient status, additional functional criteria and insurance authorization.  Follow Up Recommendations Home health PT      Assistance Recommended at Discharge Intermittent Supervision/Assistance  Patient can return home with the  following  A little help with walking and/or transfers;A little help with bathing/dressing/bathroom;Assistance with cooking/housework;Assist for transportation;Help with stairs or ramp for entrance    Equipment Recommendations Rolling walker (2 wheels)  Recommendations for Other Services  OT consult    Functional Status Assessment Patient has had a recent decline in their functional status and demonstrates the ability to make significant improvements in function in a reasonable and predictable amount of time.     Precautions / Restrictions Precautions Precautions: Fall Restrictions Weight Bearing Restrictions: No      Mobility  Bed Mobility Overal bed mobility: Needs Assistance Bed Mobility: Supine to Sit, Sit to Supine     Supine to sit: Supervision, HOB elevated Sit to supine: Supervision, HOB elevated   General bed mobility comments: increased effort to perform on own    Transfers Overall transfer level: Needs assistance Equipment used: Rolling walker (2 wheels) Transfers: Sit to/from Stand, Bed to chair/wheelchair/BSC Sit to Stand: Min guard, From elevated surface   Step pivot transfers: Min guard (stand step turn bed to/from recliner with RW use)       General transfer comment: pt requesting bed height elevated to simulate home set up (she doesn't sit on low surfaces at home prior to this); x2 trials standing from bed at lowest heights pt reports she stands from    Ambulation/Gait Ambulation/Gait assistance: Min guard Gait Distance (Feet):  (pt took steps in place x10 reps B LE's (x2 trials)) Assistive device: Rolling walker (2 wheels)   Gait velocity: mildly decreased     General Gait Details: steady taking steps with RW use  Science writer  Modified Rankin (Stroke Patients Only)       Balance Overall balance assessment: Needs assistance Sitting-balance support: Feet supported, No upper extremity supported Sitting  balance-Leahy Scale: Normal Sitting balance - Comments: steady sitting reaching outside BOS   Standing balance support: Bilateral upper extremity supported, During functional activity, Reliant on assistive device for balance Standing balance-Leahy Scale: Good Standing balance comment: steady taking steps with RW use                             Pertinent Vitals/Pain Pain Assessment Pain Assessment: Faces Faces Pain Scale: Hurts a little bit Pain Location: R thigh Pain Descriptors / Indicators: Aching, Discomfort, Tender Pain Intervention(s): Limited activity within patient's tolerance, Monitored during session, Repositioned O2 sats on room air stable and WFL throughout treatment session.  Pt's HR in a-fib: 99-118 bpm at rest to 124-142 bpm briefly with activity.    Home Living Family/patient expects to be discharged to:: Private residence Living Arrangements: Alone Available Help at Discharge: Family;Available PRN/intermittently (Aide about 2 hours a day (7 days a week)) Type of Home: Apartment Home Access: Level entry       Home Layout: One level Home Equipment: Shower seat;Grab bars - toilet;Cane - quad;Cane - single point;BSC/3in1;Rollator (4 wheels)      Prior Function Prior Level of Function : Needs assist;History of Falls (last six months)             Mobility Comments: Ambulates without any AD in house; uses [facility] outside. ADLs Comments: Per OT eval "aid helps pt get into shower, socks; Pt has slip on shoes she donns with MOD I, grooming/feeding with MOD I; assist for IADLs".     Hand Dominance        Extremity/Trunk Assessment   Upper Extremity Assessment Upper Extremity Assessment: Generalized weakness    Lower Extremity Assessment Lower Extremity Assessment: Generalized weakness       Communication   Communication: No difficulties  Cognition Arousal/Alertness: Awake/alert Behavior During Therapy: WFL for tasks assessed/performed Overall  Cognitive Status: Within Functional Limits for tasks assessed                                          General Comments General comments (skin integrity, edema, etc.): vitals monitored, appear stable throughout.  Pt agreeable to PT session.    Exercises     Assessment/Plan    PT Assessment Patient needs continued PT services  PT Problem List Decreased strength;Decreased activity tolerance;Decreased balance;Decreased mobility;Decreased knowledge of use of DME;Pain       PT Treatment Interventions DME instruction;Gait training;Functional mobility training;Therapeutic activities;Therapeutic exercise;Balance training;Patient/family education    PT Goals (Current goals can be found in the Care Plan section)  Acute Rehab PT Goals Patient Stated Goal: to go home PT Goal Formulation: With patient Time For Goal Achievement: 07/02/22 Potential to Achieve Goals: Good    Frequency Min 2X/week     Co-evaluation               AM-PAC PT "6 Clicks" Mobility  Outcome Measure Help needed turning from your back to your side while in a flat bed without using bedrails?: None Help needed moving from lying on your back to sitting on the side of a flat bed without using bedrails?: A Little Help needed moving to and from a bed to a  chair (including a wheelchair)?: A Little Help needed standing up from a chair using your arms (e.g., wheelchair or bedside chair)?: A Little Help needed to walk in hospital room?: A Little Help needed climbing 3-5 steps with a railing? : A Little 6 Click Score: 19    End of Session Equipment Utilized During Treatment: Gait belt Activity Tolerance: Patient tolerated treatment well Patient left: in bed;with call bell/phone within reach;with bed alarm set Nurse Communication: Mobility status;Precautions;Other (comment) (via white board) PT Visit Diagnosis: Other abnormalities of gait and mobility (R26.89);Muscle weakness (generalized)  (M62.81);History of falling (Z91.81);Pain Pain - Right/Left: Right Pain - part of body: Leg    Time: 1513-1530 PT Time Calculation (min) (ACUTE ONLY): 17 min   Charges:   PT Evaluation $PT Eval Low Complexity: 1 Low         Najmah Carradine, PT 06/18/22, 4:15 PM

## 2022-06-18 NOTE — Progress Notes (Signed)
Mobility Specialist - Progress Note   06/18/22 1125  Mobility  Activity Transferred from chair to bed;Ambulated with assistance in room  Level of Assistance Minimal assist, patient does 75% or more  Assistive Device Front wheel walker  Distance Ambulated (ft) 2 ft  Activity Response Tolerated well  $Mobility charge 1 Mobility   MS requesting to transfer to bed, stating "the chair hurt my butt" "I don't want to sit in that chair no more". Pt sitting in the recliner upon entry, utilizing RA. Pt STS to RW MinA, transferred to the bed CGA. Pt left supine with alarm set and needs within reach.   [place]ie [place] Mobility Specialist 06/18/22 11:29 AM

## 2022-06-18 NOTE — Progress Notes (Signed)
6:30 pm: Notified MD patient HR was in the 120's at rest, asymptomatic. Requested telemetry order be renewed. MD renewed telemetry,   7:20 pm: Telemetry notified this RN that patient's HR was in the 150's. Paged oncall provider

## 2022-06-18 NOTE — Progress Notes (Signed)
Progress Note   Patient: Adriana Miller [id number] DOB: 01/04/1956 DOA: 06/17/2022     1 DOS: the patient was seen and examined on 06/18/2022   Brief hospital course: 67 years old female admitted after a fall with acute rhabdomyolysis.  Patient's medical history of alcoholic cirrhosis as well as congestive heart failure and A-fib with RVR. Found to have bacterial conjunctivitis on admission.  Assessment and Plan:   Rhabdomyolysis CPK of 1700 on presentation Received 3 L of IV fluid bolus in the emergency room -Continue gentle hydration overnight - Monitor for evidence of hypervolemia given history of cirrhosis and heart failure - PT/OT   Lactic acidosis On presentation, patient's lactic acid was markedly elevated at 8.  Likely multifactorial in the setting of rhabdomyolysis and significant dehydration.  Initial concern for infection however patient is afebrile with no leukocytosis, and tachycardia can be explained by A-fib with RVR.  Urinalysis demonstrates few bacteria, however patient denies any urinary symptoms including dysuria, frequency or urgency.  No ascites on imaging to suggest SBP. Given history of cirrhosis, suspect lactic acid will take longer to clear   Follow-up on culture results Trend lactic acid levels Continue current IV fluid   Atrial fibrillation with RVR ([place]) Patient has a history of atrial fibrillation, presenting with RVR in the setting of profound dehydration and missed metoprolol doses.  Rates have improved with rehydration Continue metoprolol and Eliquis   Alcoholic cirrhosis of liver without ascites ([place]) Patient continues to use alcohol daily, however states she only drinks 2 beers.  No ascites seen on CT imaging  Holding home Lasix and spironolactone given dehydration Patient has been counseled on alcohol cessation  Right bacterial conjunctivitis-improved   - Erythromycin ointment every 6 hours   Alcohol abuse with other alcohol-induced  disorder [facility]) Patient states her last drink was yesterday. - CIWA with Ativan as needed - Daily thiamine and folic acid   Chronic heart failure with preserved ejection fraction (HFpEF) ([place]) Patient's last echocardiogram was in 2020 with a EF of 55-60%.  Patient appears hypovolemic at this time. -Holding home Lasix on account of dehydration requiring IV fluid for rhabdomyolysis Monitor input and output Daily weight    Type 2 diabetes mellitus ([place]) - Hold home antiglycemic agents - SSI, moderate Meter glucose level closely  History of seizure Patient is not currently on any AEDs.   Hepatocellular carcinoma ([place]) History of stage II HCC  s/p radiation segmentectomy.  Last MRI in January 2024 with interval increase of lesion in the anterior right lobe.  Patient following with palliative care and IR.   Advance Care Planning:   Code Status: DNR/DNI.  Verified by patient.  Corroborated by patient's brother via telephone       Subjective: Patient seen and examined at bedside this morning She did complain of some muscular pain around the left thigh area Denied shortness of breath chest pain cough or urinary complaints  Physical Exam: Vitals:   06/18/22 0043 06/18/22 0600 06/18/22 0820 06/18/22 1451  BP: 135/71  (!) 125/91 (!) 154/112  Pulse: (!) 108  87 100  Resp: '20  20 19  '$ Temp: 98.3 F (36.8 C)  97.6 F (36.4 C) 98 F (36.7 C)  TempSrc: Oral  Oral Oral  SpO2:   100% 100%  Weight:  82.4 kg    Height:       Patient appears overweight in no acute distress Cardiovascular: Irregularly irregular S1 and 2 head Respiratory: Transmitted sounds heard bilaterally Abdominal: Nontender nondistended  Musculoskeletal: Tenderness in thigh area bilaterally, no swelling  Data Reviewed:  Labs reviewed by me  Family Communication: Plan of care discussed with patient  Disposition: Status is: Inpatient   Planned Discharge Destination: Pending clinical course     Time spent:  35 minutes  Author: Verline Lema, MD 06/18/2022 3:31 PM  For on call review [email or web address].

## 2022-06-18 NOTE — Evaluation (Signed)
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 Pt is alert and oriented x4, fair safety awareness throughout. PTA pt reports she has an aid to assist 7 days a week a few hours a day, sometimes she tells them not to come per pt report. She reports some assist for ADL from aids however is able to perform UB dressing/feeding/grooming with MOD I, assist for shower transfers and LB dressing. Aid assists with IADLs. Pt reports she has a "falls" button but was not wearing it when she fell. Pt presents with deficits in strength, endurance, activity tolerance all affecting safe and optimal ADL completion. Pt will benefit from continued skilled OT to address functiona deficits and to improve safe ADL completion. OT will continue to follow acutely.      Recommendations for follow up therapy are one component of a multi-disciplinary discharge planning process, led by the attending physician.  Recommendations may be updated based on patient status, additional functional criteria and insurance authorization.   Follow Up Recommendations  Skilled nursing-short term rehab (<3 hours/day) (potential to progress to [facility] pending progress)     Assistance Recommended at Discharge Frequent or constant Supervision/Assistance  Patient can return home with the following A little help with walking and/or transfers;A little help with bathing/dressing/bathroom    Functional Status Assessment   Patient has had a recent decline in their functional status and demonstrates the ability to make significant improvements in function in a reasonable and predictable amount of time.  Equipment Recommendations  Other (comment) (defer)    Recommendations for Other Services       Precautions / Restrictions Precautions Precautions: Fall Restrictions Weight Bearing Restrictions: No      Mobility Bed Mobility Overal bed mobility: Needs Assistance Bed Mobility: Supine to Sit     Supine to sit: Mod assist, HOB elevated          Transfers Overall transfer level: Needs assistance Equipment used: Rolling walker (2 wheels) Transfers: Sit to/from Stand Sit to Stand: Min assist, From elevated surface           General transfer comment: frequent vcs for technique      Balance Overall balance assessment: Needs assistance Sitting-balance support: Feet supported Sitting balance-Leahy Scale: Good     Standing balance support: Bilateral upper extremity supported, During functional activity, Reliant on assistive device for balance Standing balance-Leahy Scale: Fair                             ADL either performed or assessed with clinical judgement   ADL Overall ADL's : Needs assistance/impaired     Grooming: Wash/dry face;Sitting;Set up           Upper Body Dressing : Minimal assistance   Lower Body Dressing: Maximal assistance   Toilet Transfer: Minimal assistance;Rolling walker (2 wheels) Toilet Transfer Details (indicate cue type and reason): simulated with RW, intermittent vcs for safety Toileting- Clothing Manipulation and Hygiene: Moderate assistance       Functional mobility during  ADLs: Minimal assistance;Rolling walker (2 wheels)       Vision Patient Visual Report: No change from baseline       Perception     Praxis      Pertinent Vitals/Pain Pain Assessment Pain Assessment: Faces Faces Pain Scale: Hurts a little bit Pain  Location: R side Pain Descriptors / Indicators: Aching, Discomfort, Grimacing Pain Intervention(s): Limited activity within patient's tolerance, Monitored during session, Repositioned     Hand Dominance     Extremity/Trunk Assessment Upper Extremity Assessment Upper Extremity Assessment: Generalized weakness   Lower Extremity Assessment Lower Extremity Assessment: Generalized weakness       Communication Communication Communication: No difficulties   Cognition Arousal/Alertness: Awake/alert Behavior During Therapy: WFL for tasks assessed/performed Overall Cognitive Status: No family/caregiver present to determine baseline cognitive functioning Area of Impairment: Problem solving, Awareness                           Awareness: Emergent Problem Solving: Slow processing, Requires verbal cues, Requires tactile cues       General Comments  vitals monitored, appear stable throughout    Exercises Other Exercises Other Exercises: educated pt re: role of OT, role of rehab, discharge recommendations, home safety, falls prevention , DME use   Shoulder Instructions      Home Living Family/patient expects to be discharged to:: Private residence Living Arrangements: Alone Available Help at Discharge: Family;Available PRN/intermittently (aid 2 ish hrs a day 7 days a week) Type of Home: Apartment Home Access: Level entry     Home Layout: One level     Bathroom Shower/Tub: Teacher, early years/pre: Handicapped height     Home Equipment: Conservation officer, nature (2 wheels);Shower seat;Grab bars - toilet;Cane - quad;Cane - single point;BSC/3in1          Prior Functioning/Environment Prior Level of Function : Needs assist;History of Falls (last six months)             Mobility Comments: amb with no AD in the house, RW community distances ADLs Comments: aid helps pt get into shower, socks; Pt has slip on shoes she donns wtih MOD I, grooming/feeding with MOD I;  assist for IADLs        OT Problem List: Decreased strength;Decreased activity tolerance;Impaired balance (sitting and/or standing);Decreased safety awareness;Decreased knowledge of use of DME or AE      OT Treatment/Interventions: Self-care/ADL training;Patient/family education;Therapeutic exercise;Balance training;Therapeutic activities;DME and/or AE instruction    OT Goals(Current goals can be found in the care plan section) Acute Rehab OT Goals Patient Stated Goal: go home OT Goal Formulation: With patient Time For Goal Achievement: 07/02/22 Potential to Achieve Goals: Good ADL Goals Pt Will Perform Grooming: sitting;with modified independence;standing Pt Will Perform Lower Body Dressing: with modified independence;sit to/from stand Pt Will Transfer to Toilet: with modified independence;ambulating Pt Will Perform Toileting - Clothing Manipulation and hygiene: with modified independence;sit to/from stand  OT Frequency: Min 2X/week    Co-evaluation              AM-PAC OT "6 Clicks" Daily Activity     Outcome Measure Help from another person eating meals?: None Help from another person taking care of personal grooming?: None Help from another person toileting, which includes using toliet, bedpan, or urinal?: A Lot Help from another person bathing (including washing, rinsing, drying)?: A Lot Help from another person to put on and taking off regular upper body clothing?: A Little Help from  another person to put on and taking off regular lower body clothing?: A Lot 6 Click Score: 17   End of Session Equipment Utilized During Treatment: Rolling walker (2 wheels) Nurse Communication: Mobility status  Activity Tolerance: Patient tolerated treatment well Patient left: in chair;with call bell/phone within reach;with chair alarm set  OT Visit Diagnosis: Unsteadiness on feet (R26.81);Muscle weakness (generalized) (M62.81);History of falling (Z91.81)                Time:  [id number] OT Time Calculation (min): 19 min Charges:  OT General Charges $OT Visit: 1 Visit OT Evaluation $OT Eval Low Complexity: 1 Low  Shanon Payor, OTD OTR/L  06/18/22, 1:24 PM

## 2022-06-19 DIAGNOSIS — T796XXD Traumatic ischemia of muscle, subsequent encounter: Secondary | ICD-10-CM

## 2022-06-19 LAB — CBC WITH DIFFERENTIAL/PLATELET
Abs Immature Granulocytes: 0.01 10*3/uL (ref 0.00–0.07)
Basophils Absolute: 0 10*3/uL (ref 0.0–0.1)
Basophils Relative: 1 %
Eosinophils Absolute: 0 10*3/uL (ref 0.0–0.5)
Eosinophils Relative: 0 %
HCT: 34 % — ABNORMAL LOW (ref 36.0–46.0)
Hemoglobin: 11.1 g/dL — ABNORMAL LOW (ref 12.0–15.0)
Immature Granulocytes: 0 %
Lymphocytes Relative: 32 %
Lymphs Abs: 1.4 10*3/uL (ref 0.7–4.0)
MCH: 31.7 pg (ref 26.0–34.0)
MCHC: 32.6 g/dL (ref 30.0–36.0)
MCV: 97.1 fL (ref 80.0–100.0)
Monocytes Absolute: 0.3 10*3/uL (ref 0.1–1.0)
Monocytes Relative: 7 %
Neutro Abs: 2.7 10*3/uL (ref 1.7–7.7)
Neutrophils Relative %: 60 %
Platelets: 83 10*3/uL — ABNORMAL LOW (ref 150–400)
RBC: 3.5 MIL/uL — ABNORMAL LOW (ref 3.87–5.11)
RDW: 14 % (ref 11.5–15.5)
WBC: 4.4 10*3/uL (ref 4.0–10.5)
nRBC: 0 % (ref 0.0–0.2)

## 2022-06-19 LAB — BASIC METABOLIC PANEL
Anion gap: 7 (ref 5–15)
BUN: 11 mg/dL (ref 8–23)
CO2: 27 mmol/L (ref 22–32)
Calcium: 7.6 mg/dL — ABNORMAL LOW (ref 8.9–10.3)
Chloride: 105 mmol/L (ref 98–111)
Creatinine, Ser: 0.99 mg/dL (ref 0.44–1.00)
GFR, Estimated: >60 mL/min (ref >60)
Glucose, Bld: 105 mg/dL — ABNORMAL HIGH (ref 70–99)
Potassium: 3.4 mmol/L — ABNORMAL LOW (ref 3.5–5.1)
Sodium: 139 mmol/L (ref 135–145)

## 2022-06-19 LAB — CK: Total CK: 850 U/L — ABNORMAL HIGH (ref 38–234)

## 2022-06-19 LAB — GLUCOSE, CAPILLARY
Glucose-Capillary: 72 mg/dL (ref 70–99)
Glucose-Capillary: 158 mg/dL — ABNORMAL HIGH (ref 70–99)

## 2022-06-19 MED ORDER — POTASSIUM CHLORIDE 20 MEQ PO PACK
40.0000 meq | PACK | Freq: Once | ORAL | Status: AC
  Administered 2022-06-19: 40 meq via ORAL
  Filled 2022-06-19: qty 2

## 2022-06-19 MED ORDER — FOLIC ACID 1 MG PO TABS: 1.0000 mg | ORAL_TABLET | Freq: Every day | ORAL | 0 refills | Status: DC

## 2022-06-19 MED ORDER — ACETAMINOPHEN 325 MG PO TABS: 650.0000 mg | ORAL_TABLET | Freq: Four times a day (QID) | ORAL | 0 refills | Status: AC | PRN

## 2022-06-19 MED ORDER — ERYTHROMYCIN 5 MG/GM OP OINT: TOPICAL_OINTMENT | Freq: Four times a day (QID) | OPHTHALMIC | 0 refills | Status: DC

## 2022-06-19 MED ORDER — VITAMIN B-1 100 MG PO TABS: 100.0000 mg | ORAL_TABLET | Freq: Every day | ORAL | 1 refills | Status: DC

## 2022-06-19 NOTE — Discharge Summary (Signed)
Physician Discharge Summary   Patient: Adriana Miller MRN: [id number] DOB: [birth year]  Admit date:     06/17/2022  Discharge date: 06/19/22  Discharge Physician: Verline Lema   PCP: Mylinda Latina, PA-C   Recommendations at discharge:  Avoid alcohol use  Discharge Diagnoses: Principal Problem:   Rhabdomyolysis Active Problems:   Lactic acidosis   Atrial fibrillation with RVR (HCC)   Alcoholic cirrhosis of liver without ascites (HCC)   Bacterial conjunctivitis   Alcohol abuse with other alcohol-induced disorder ([place])   Chronic heart failure with preserved ejection fraction (HFpEF) (HCC)   Type 2 diabetes mellitus ([place])   History of seizure   Hepatocellular carcinoma ([place])   Dehydration  Resolved Problems:   Sepsis [facility])  Hospital Course:   Adriana Miller is a 67 y.o. 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 Ms. Szafranski states that she was in her usual state of health when she was walking to her bathroom, and lost her balance.  She states that she was not experiencing any dizziness, vision changes, headache, chest pain, shortness of breath or palpitations prior to the fall or afterwards.  She did hit her face on the ground but denies any loss of consciousness.  Due to generalized weakness, she had difficulty getting up off the ground and ended up laying on her right side for the entire night.  Due to this, she is experiencing significant right arm and leg soreness.  She believes On arrival to the ED, patient was hypertensive at 138/98 with heart rate of 117.  She was saturating at 100% on room air.  She was afebrile at 97.6.  Initial workup remarkable for WBC of 7.7, hemoglobin of 14.4, platelets of 139, potassium 4.0,  bicarb 18, creatinine 1.14, anion gap of 20, AST 88, and GFR 52.  CK elevated at 1700.  Lactic acid elevated at 8.3 with downtrend to 5.7.  INR elevated at 1.4.  Urinalysis with ketones, hematuria, proteinuria, small leukocytes and few bacteria.  CT of the head, C-spine, maxillofacial and abdomen/pelvis were obtained; no acute abnormalities noted.  X-rays of the chest, forearm, humerus and knee were obtained; Notable for low lung volumes with atelectasis and vascular crowding, however no other acute abnormalities noted.  Due to initial concerns for underlying infection, patient started on broad-spectrum antibiotic and IV fluids.  Due to rhabdomyolysis and lactic acidosis, [facility] contacted for admission.  Patient received IV fluid resuscitation with significant improvement in her rhabdomyolysis. She was also seen by physical therapist.  Patient condition improved and she was able to walk around with the nurse.  We discussed options of continuing to have physical therapy evaluation to determine possible discharge destination.  Patient made me and the nurse aware that she does not want to be discharged to a facility accept going home.  She is however open to going home with home health PT. Is patient is significantly better now we will discharge her home today.  Social services have been informed and home PT arrangement is being made.  She has also been counseled on avoidance of alcohol.       Consultants: Physical therapy, Occupational Therapy Procedures performed: None Disposition: Home health Diet recommendation:  Cardiac diet DISCHARGE MEDICATION: Allergies as of 06/19/2022       Reactions  Aspirin Other (See Comments)   Nose bleed        Medication List     STOP taking these medications    HYDROcodone-acetaminophen 5-325 MG tablet Commonly known as: Norco   traMADol 50 MG tablet Commonly known as: ULTRAM       TAKE these medications    Accu-Chek Guide Me w/Device Kit Use as  directed  dx E11.65   Accu-Chek Guide test strip Generic drug: glucose blood USE AS INSTRUCTED ONCE A DAILY   acetaminophen 325 MG tablet Commonly known as: TYLENOL Take 2 tablets (650 mg total) by mouth every 6 (six) hours as needed for mild pain (or Fever >/= 101).   allopurinol 100 MG tablet Commonly known as: ZYLOPRIM TAKE 1 TABLET BY MOUTH DAILY   B-D SINGLE USE SWABS REGULAR Pads Use as directed  twice daily   BD Pen Needle Nano 2nd Gen 32G X 4 MM Misc Generic drug: Insulin Pen Needle USE AS DIRECTED WITH TOUJEO   Calcium Carb-Cholecalciferol 500-5 MG-MCG Tabs Commonly known as: Calcium 500/D Take 1 tab po daily   Dexcom G7 Receiver Devi Use for continuous blood glucose monitoring  DX E11.65   Dexcom G7 Sensor Misc Use as directed for continuous glucose monitoring.  Change sensor every 10 days   E11.65   Eliquis 5 MG Tabs tablet Generic drug: apixaban TAKE 1 TABLET BY MOUTH TWICE A DAY What changed: how much to take   ergocalciferol 1.25 MG (50000 UT) capsule Commonly known as: VITAMIN D2 Take 1 capsule (50,000 Units total) by mouth once a week.   erythromycin ophthalmic ointment Place into both eyes every 6 (six) hours.   escitalopram 10 MG tablet Commonly known as: LEXAPRO TAKE 1 TABLET (10 MG TOTAL) BY MOUTH DAILY. FOR ANXIETY/SLEEP   folic acid 1 MG tablet Commonly known as: FOLVITE Take 1 tablet (1 mg total) by mouth daily. Start taking on: June 20, 2022   furosemide 20 MG tablet Commonly known as: LASIX TAKE 1 TABLET (20 MG TOTAL) BY MOUTH DAILY. NEED APPT FOR MORE REFILLS   glimepiride 1 MG tablet Commonly known as: AMARYL TAKE 1 TABLET BY MOUTH EVERY DAY WITH BREAKFAST What changed:  how much to take how to take this when to take this additional instructions   metoprolol tartrate 25 MG tablet Commonly known as: LOPRESSOR TAKE 0.5 TABLETS BY MOUTH 2 TIMES DAILY. What changed:  how much to take how to take this when to take  this additional instructions   mirtazapine 7.5 MG tablet Commonly known as: REMERON TAKE 1 TABLET BY MOUTH EVERYDAY AT BEDTIME What changed:  how much to take how to take this when to take this additional instructions   potassium chloride 8 MEQ tablet Commonly known as: KLOR-CON Take 1 tablet (8 mEq total) by mouth daily.   Safety Lancets 28G Misc Use as directed once a daily Dx e11.65   spironolactone 25 MG tablet Commonly known as: ALDACTONE Take 1 tablet (25 mg total) by mouth daily. For cirrhosis of liver   thiamine 100 MG tablet Commonly known as: Vitamin B-1 Take 1 tablet (100 mg total) by mouth daily. Start taking on: June 20, 2022   Toujeo SoloStar 300 UNIT/ML Solostar Pen Generic drug: insulin glargine (1 Unit Dial) INJECT 30 UNITS SUBCUTANEOUSLY IN THE EVENING WITH FOOD What changed:  how much to take how to take this when to take this   traZODone 50 MG tablet Commonly known as: DESYREL TAKE 1 TO 2  TABLETS BY MOUTH AT  BEDTIME FOR INSOMNIA What changed:  how much to take how to take this when to take this additional instructions               Durable Medical Equipment  (From admission, onward)           Start     Ordered   06/18/22 1608  For home use only DME Walker rolling  Once       Question Answer Comment  Walker: With [place] Whe[employer]   Patient needs a walker to treat with the following condition Other abnormalities of gait and mobility      06/18/22 1607            Follow-up Information     McDonough, Si Gaul, PA-C. Schedule an appointment as soon as possible for a visit.   Specialty: Physician Assistant Contact information: [place] [address] [phone]                Discharge Exam: Danley Danker Weights   06/17/22 1330 06/17/22 2043 06/18/22 0600  Weight: 81.6 kg 82.5 kg 82.4 kg   Physical examination: Patient appears overweight in no acute distress Cardiovascular: Irregularly irregular S1 and  2 head Respiratory: Transmitted sounds heard bilaterally Abdominal: Nontender nondistended Musculoskeletal: Tenderness in thigh area bilaterally much better today, no swelling  Condition at discharge: good  The results of significant diagnostics from this hospitalization (including imaging, microbiology, ancillary and laboratory) are listed below for reference.   Imaging Studies:   Discharge time spent: I spent 35 minutes discharging this patient as well as coordinating care with case worker, social work and counseling on alcohol cessation.  Signed: Verline Lema, MD [facility] 06/19/2022

## 2022-06-19 NOTE — TOC Transition Note (Signed)
Transition of Care [facility]) - CM/SW Discharge Note   Patient Details  Name: Adriana Miller MRN: [id number] Date of Birth: [birth year]  [facility] CM/SW Contact:  Raina Mina, [place] Phone Number: 06/19/2022, 12:03 PM   Clinical Narrative:   CSW spoke with patient who told CSW she already has an aide. CSW explained home health services for physical therapy. Patient is agreeable. CSW contacted [facility] who can provide patient with PT services at home.           Patient Goals and CMS Choice      Discharge Placement                         Discharge Plan and Services Additional resources added to the After Visit Summary for                                       Social Determinants of Health (SDOH) Interventions SDOH Screenings   Food Insecurity: No Food Insecurity (06/18/2022)  Housing: Medium Risk (06/18/2022)  Transportation Needs: No Transportation Needs (06/18/2022)  Utilities: Not At Risk (06/18/2022)  Alcohol Screen: Low Risk  (05/13/2022)  Depression (PHQ2-9): Low Risk  (05/13/2022)  Tobacco Use: High Risk (06/18/2022)     Readmission Risk Interventions     No data to display

## 2022-06-22 LAB — CULTURE, BLOOD (ROUTINE X 2): Culture: NO GROWTH

## 2022-06-23 ENCOUNTER — Other Ambulatory Visit: Payer: 59

## 2022-06-23 VITALS — BP 120/70 | HR 88 | Temp 97.6°F | Wt 181.0 lb

## 2022-06-23 DIAGNOSIS — Z515 Encounter for palliative care: Secondary | ICD-10-CM

## 2022-06-23 LAB — CULTURE, BLOOD (ROUTINE X 2): Culture: NO GROWTH

## 2022-06-23 NOTE — Progress Notes (Signed)
PATIENT NAME: Adriana Miller DOB: 11-12-1955 MRN: [id number]  PRIMARY CARE PROVIDER: Mylinda Latina, PA-C  RESPONSIBLE PARTY:  Acct ID - Guarantor Home Phone Work Phone Relationship Acct Type  [id number] LAQUAYA, [phone]  Self P/F     [address] [name],  [phone]   Home visit completed with patient.   Alcoholic Cirrhosis:  Patient endorsed drinking 1 beer nightly to help her sleep.  Patient states she has stopped since discharging from the hospital.  No ascites noted.   Appetite:  Patient endorses no changes in her appetite.  Still eating one meal a day.  Weight up to 181 lbs in the hospital.   Hospitalization/Fall:  Patient endorses a fall and being hospitalized.  She states she lost her balance and fell in her room.  She believes she laid in the floor for 24 hours.   Patient is now wearing her life alert pendant.  Home Health:  Patient has not heard from [facility].  I contacted the office and they do not have patient listed in their system.  I have left a message with the director Santiago Glad to follow up on referral.  1240 pm.  Patient received a call from [facility] during this visit.  PT visit is scheduled for Monday   Medication Management:  Reviewed discharge summary with patient and medications.  She has not started Potassium, Vitamin B1 and Folic Acid.  Pill box filled for the next 2 weeks at patient request.   Meals on Wheels:  Patient stopped meals during her hospitalization and this will be restarted on Monday.   Shortness of breath:  Patient endorses some shortness of breath when laying down.  She advised this was not new.  Currently sleeping with 3 pillows at night.  No edema noted.  Follow up visit scheduled for 2 weeks.   CODE STATUS: DNR ADVANCED DIRECTIVES: No MOST FORM: Yes PPS: 50%   PHYSICAL EXAM:   VITALS: Today's Vitals   06/23/22 1145  BP: 120/70  Pulse: 88  Temp: 97.6 F (36.4 C)  SpO2: 96%  Weight: 181 lb (82.1 kg)     LUNGS: decreased breath sounds CARDIAC: Cor irreg, irreg RRR}  EXTREMITIES: - for edema SKIN: Skin color, texture, turgor normal. No rashes or lesions or mobility and turgor normal  NEURO: positive for gait problems and weakness       [name], RN

## 2022-06-28 ENCOUNTER — Telehealth: Payer: Self-pay

## 2022-06-28 DIAGNOSIS — E872 Acidosis, unspecified: Secondary | ICD-10-CM | POA: Diagnosis not present

## 2022-06-28 DIAGNOSIS — Z794 Long term (current) use of insulin: Secondary | ICD-10-CM | POA: Diagnosis not present

## 2022-06-28 DIAGNOSIS — H1089 Other conjunctivitis: Secondary | ICD-10-CM | POA: Diagnosis not present

## 2022-06-28 DIAGNOSIS — E538 Deficiency of other specified B group vitamins: Secondary | ICD-10-CM | POA: Diagnosis not present

## 2022-06-28 DIAGNOSIS — Z9181 History of falling: Secondary | ICD-10-CM | POA: Diagnosis not present

## 2022-06-28 DIAGNOSIS — I130 Hypertensive heart and chronic kidney disease with heart failure and stage 1 through stage 4 chronic kidney disease, or unspecified chronic kidney disease: Secondary | ICD-10-CM | POA: Diagnosis not present

## 2022-06-28 DIAGNOSIS — M6282 Rhabdomyolysis: Secondary | ICD-10-CM | POA: Diagnosis not present

## 2022-06-28 DIAGNOSIS — I480 Paroxysmal atrial fibrillation: Secondary | ICD-10-CM | POA: Diagnosis not present

## 2022-06-28 DIAGNOSIS — E1122 Type 2 diabetes mellitus with diabetic chronic kidney disease: Secondary | ICD-10-CM | POA: Diagnosis not present

## 2022-06-28 DIAGNOSIS — Z7901 Long term (current) use of anticoagulants: Secondary | ICD-10-CM | POA: Diagnosis not present

## 2022-06-28 DIAGNOSIS — Z7984 Long term (current) use of oral hypoglycemic drugs: Secondary | ICD-10-CM | POA: Diagnosis not present

## 2022-06-28 DIAGNOSIS — N183 Chronic kidney disease, stage 3 unspecified: Secondary | ICD-10-CM | POA: Diagnosis not present

## 2022-06-28 DIAGNOSIS — I5030 Unspecified diastolic (congestive) heart failure: Secondary | ICD-10-CM | POA: Diagnosis not present

## 2022-06-28 DIAGNOSIS — I4892 Unspecified atrial flutter: Secondary | ICD-10-CM | POA: Diagnosis not present

## 2022-06-28 DIAGNOSIS — C220 Liver cell carcinoma: Secondary | ICD-10-CM | POA: Diagnosis not present

## 2022-06-28 DIAGNOSIS — E785 Hyperlipidemia, unspecified: Secondary | ICD-10-CM | POA: Diagnosis not present

## 2022-06-30 DIAGNOSIS — E538 Deficiency of other specified B group vitamins: Secondary | ICD-10-CM | POA: Diagnosis not present

## 2022-06-30 DIAGNOSIS — I5030 Unspecified diastolic (congestive) heart failure: Secondary | ICD-10-CM | POA: Diagnosis not present

## 2022-06-30 DIAGNOSIS — M6282 Rhabdomyolysis: Secondary | ICD-10-CM | POA: Diagnosis not present

## 2022-06-30 DIAGNOSIS — I4892 Unspecified atrial flutter: Secondary | ICD-10-CM | POA: Diagnosis not present

## 2022-06-30 DIAGNOSIS — H1089 Other conjunctivitis: Secondary | ICD-10-CM | POA: Diagnosis not present

## 2022-06-30 DIAGNOSIS — Z9181 History of falling: Secondary | ICD-10-CM | POA: Diagnosis not present

## 2022-06-30 DIAGNOSIS — E1122 Type 2 diabetes mellitus with diabetic chronic kidney disease: Secondary | ICD-10-CM | POA: Diagnosis not present

## 2022-06-30 DIAGNOSIS — I480 Paroxysmal atrial fibrillation: Secondary | ICD-10-CM | POA: Diagnosis not present

## 2022-06-30 DIAGNOSIS — E785 Hyperlipidemia, unspecified: Secondary | ICD-10-CM | POA: Diagnosis not present

## 2022-06-30 DIAGNOSIS — C220 Liver cell carcinoma: Secondary | ICD-10-CM | POA: Diagnosis not present

## 2022-06-30 DIAGNOSIS — Z7984 Long term (current) use of oral hypoglycemic drugs: Secondary | ICD-10-CM | POA: Diagnosis not present

## 2022-06-30 DIAGNOSIS — I130 Hypertensive heart and chronic kidney disease with heart failure and stage 1 through stage 4 chronic kidney disease, or unspecified chronic kidney disease: Secondary | ICD-10-CM | POA: Diagnosis not present

## 2022-06-30 DIAGNOSIS — N183 Chronic kidney disease, stage 3 unspecified: Secondary | ICD-10-CM | POA: Diagnosis not present

## 2022-06-30 DIAGNOSIS — E872 Acidosis, unspecified: Secondary | ICD-10-CM | POA: Diagnosis not present

## 2022-06-30 DIAGNOSIS — Z794 Long term (current) use of insulin: Secondary | ICD-10-CM | POA: Diagnosis not present

## 2022-06-30 DIAGNOSIS — Z7901 Long term (current) use of anticoagulants: Secondary | ICD-10-CM | POA: Diagnosis not present

## 2022-07-02 DIAGNOSIS — H1089 Other conjunctivitis: Secondary | ICD-10-CM | POA: Diagnosis not present

## 2022-07-02 DIAGNOSIS — Z9181 History of falling: Secondary | ICD-10-CM | POA: Diagnosis not present

## 2022-07-02 DIAGNOSIS — N183 Chronic kidney disease, stage 3 unspecified: Secondary | ICD-10-CM | POA: Diagnosis not present

## 2022-07-02 DIAGNOSIS — E872 Acidosis, unspecified: Secondary | ICD-10-CM | POA: Diagnosis not present

## 2022-07-02 DIAGNOSIS — Z7901 Long term (current) use of anticoagulants: Secondary | ICD-10-CM | POA: Diagnosis not present

## 2022-07-02 DIAGNOSIS — I130 Hypertensive heart and chronic kidney disease with heart failure and stage 1 through stage 4 chronic kidney disease, or unspecified chronic kidney disease: Secondary | ICD-10-CM | POA: Diagnosis not present

## 2022-07-02 DIAGNOSIS — Z794 Long term (current) use of insulin: Secondary | ICD-10-CM | POA: Diagnosis not present

## 2022-07-02 DIAGNOSIS — E1122 Type 2 diabetes mellitus with diabetic chronic kidney disease: Secondary | ICD-10-CM | POA: Diagnosis not present

## 2022-07-02 DIAGNOSIS — I5030 Unspecified diastolic (congestive) heart failure: Secondary | ICD-10-CM | POA: Diagnosis not present

## 2022-07-02 DIAGNOSIS — E785 Hyperlipidemia, unspecified: Secondary | ICD-10-CM | POA: Diagnosis not present

## 2022-07-02 DIAGNOSIS — I4892 Unspecified atrial flutter: Secondary | ICD-10-CM | POA: Diagnosis not present

## 2022-07-02 DIAGNOSIS — Z7984 Long term (current) use of oral hypoglycemic drugs: Secondary | ICD-10-CM | POA: Diagnosis not present

## 2022-07-02 DIAGNOSIS — M6282 Rhabdomyolysis: Secondary | ICD-10-CM | POA: Diagnosis not present

## 2022-07-02 DIAGNOSIS — C220 Liver cell carcinoma: Secondary | ICD-10-CM | POA: Diagnosis not present

## 2022-07-02 DIAGNOSIS — I480 Paroxysmal atrial fibrillation: Secondary | ICD-10-CM | POA: Diagnosis not present

## 2022-07-02 DIAGNOSIS — E538 Deficiency of other specified B group vitamins: Secondary | ICD-10-CM | POA: Diagnosis not present

## 2022-07-05 ENCOUNTER — Inpatient Hospital Stay: Payer: 59 | Admitting: Physician Assistant

## 2022-07-06 DIAGNOSIS — E872 Acidosis, unspecified: Secondary | ICD-10-CM | POA: Diagnosis not present

## 2022-07-06 DIAGNOSIS — N183 Chronic kidney disease, stage 3 unspecified: Secondary | ICD-10-CM | POA: Diagnosis not present

## 2022-07-06 DIAGNOSIS — Z7984 Long term (current) use of oral hypoglycemic drugs: Secondary | ICD-10-CM | POA: Diagnosis not present

## 2022-07-06 DIAGNOSIS — I480 Paroxysmal atrial fibrillation: Secondary | ICD-10-CM | POA: Diagnosis not present

## 2022-07-06 DIAGNOSIS — M6282 Rhabdomyolysis: Secondary | ICD-10-CM | POA: Diagnosis not present

## 2022-07-06 DIAGNOSIS — I4892 Unspecified atrial flutter: Secondary | ICD-10-CM | POA: Diagnosis not present

## 2022-07-06 DIAGNOSIS — I5030 Unspecified diastolic (congestive) heart failure: Secondary | ICD-10-CM | POA: Diagnosis not present

## 2022-07-06 DIAGNOSIS — H1089 Other conjunctivitis: Secondary | ICD-10-CM | POA: Diagnosis not present

## 2022-07-06 DIAGNOSIS — C220 Liver cell carcinoma: Secondary | ICD-10-CM | POA: Diagnosis not present

## 2022-07-06 DIAGNOSIS — Z7901 Long term (current) use of anticoagulants: Secondary | ICD-10-CM | POA: Diagnosis not present

## 2022-07-06 DIAGNOSIS — E538 Deficiency of other specified B group vitamins: Secondary | ICD-10-CM | POA: Diagnosis not present

## 2022-07-06 DIAGNOSIS — I130 Hypertensive heart and chronic kidney disease with heart failure and stage 1 through stage 4 chronic kidney disease, or unspecified chronic kidney disease: Secondary | ICD-10-CM | POA: Diagnosis not present

## 2022-07-06 DIAGNOSIS — E785 Hyperlipidemia, unspecified: Secondary | ICD-10-CM | POA: Diagnosis not present

## 2022-07-06 DIAGNOSIS — Z9181 History of falling: Secondary | ICD-10-CM | POA: Diagnosis not present

## 2022-07-06 DIAGNOSIS — E1122 Type 2 diabetes mellitus with diabetic chronic kidney disease: Secondary | ICD-10-CM | POA: Diagnosis not present

## 2022-07-06 DIAGNOSIS — Z794 Long term (current) use of insulin: Secondary | ICD-10-CM | POA: Diagnosis not present

## 2022-07-07 NOTE — Telephone Encounter (Signed)
done

## 2022-07-08 ENCOUNTER — Other Ambulatory Visit: Payer: 59

## 2022-07-08 DIAGNOSIS — E1122 Type 2 diabetes mellitus with diabetic chronic kidney disease: Secondary | ICD-10-CM | POA: Diagnosis not present

## 2022-07-08 DIAGNOSIS — Z7984 Long term (current) use of oral hypoglycemic drugs: Secondary | ICD-10-CM | POA: Diagnosis not present

## 2022-07-08 DIAGNOSIS — E785 Hyperlipidemia, unspecified: Secondary | ICD-10-CM | POA: Diagnosis not present

## 2022-07-08 DIAGNOSIS — Z7901 Long term (current) use of anticoagulants: Secondary | ICD-10-CM | POA: Diagnosis not present

## 2022-07-08 DIAGNOSIS — C220 Liver cell carcinoma: Secondary | ICD-10-CM | POA: Diagnosis not present

## 2022-07-08 DIAGNOSIS — M6282 Rhabdomyolysis: Secondary | ICD-10-CM | POA: Diagnosis not present

## 2022-07-08 DIAGNOSIS — H1089 Other conjunctivitis: Secondary | ICD-10-CM | POA: Diagnosis not present

## 2022-07-08 DIAGNOSIS — E872 Acidosis, unspecified: Secondary | ICD-10-CM | POA: Diagnosis not present

## 2022-07-08 DIAGNOSIS — N183 Chronic kidney disease, stage 3 unspecified: Secondary | ICD-10-CM | POA: Diagnosis not present

## 2022-07-08 DIAGNOSIS — Z794 Long term (current) use of insulin: Secondary | ICD-10-CM | POA: Diagnosis not present

## 2022-07-08 DIAGNOSIS — Z9181 History of falling: Secondary | ICD-10-CM | POA: Diagnosis not present

## 2022-07-08 DIAGNOSIS — I4892 Unspecified atrial flutter: Secondary | ICD-10-CM | POA: Diagnosis not present

## 2022-07-08 DIAGNOSIS — I130 Hypertensive heart and chronic kidney disease with heart failure and stage 1 through stage 4 chronic kidney disease, or unspecified chronic kidney disease: Secondary | ICD-10-CM | POA: Diagnosis not present

## 2022-07-08 DIAGNOSIS — I5030 Unspecified diastolic (congestive) heart failure: Secondary | ICD-10-CM | POA: Diagnosis not present

## 2022-07-08 DIAGNOSIS — I480 Paroxysmal atrial fibrillation: Secondary | ICD-10-CM | POA: Diagnosis not present

## 2022-07-08 DIAGNOSIS — E538 Deficiency of other specified B group vitamins: Secondary | ICD-10-CM | POA: Diagnosis not present

## 2022-07-09 ENCOUNTER — Other Ambulatory Visit: Payer: 59

## 2022-07-12 DIAGNOSIS — E872 Acidosis, unspecified: Secondary | ICD-10-CM | POA: Diagnosis not present

## 2022-07-12 DIAGNOSIS — H1089 Other conjunctivitis: Secondary | ICD-10-CM | POA: Diagnosis not present

## 2022-07-12 DIAGNOSIS — Z794 Long term (current) use of insulin: Secondary | ICD-10-CM | POA: Diagnosis not present

## 2022-07-12 DIAGNOSIS — M6282 Rhabdomyolysis: Secondary | ICD-10-CM | POA: Diagnosis not present

## 2022-07-12 DIAGNOSIS — I4892 Unspecified atrial flutter: Secondary | ICD-10-CM | POA: Diagnosis not present

## 2022-07-12 DIAGNOSIS — I130 Hypertensive heart and chronic kidney disease with heart failure and stage 1 through stage 4 chronic kidney disease, or unspecified chronic kidney disease: Secondary | ICD-10-CM | POA: Diagnosis not present

## 2022-07-12 DIAGNOSIS — N183 Chronic kidney disease, stage 3 unspecified: Secondary | ICD-10-CM | POA: Diagnosis not present

## 2022-07-12 DIAGNOSIS — E785 Hyperlipidemia, unspecified: Secondary | ICD-10-CM | POA: Diagnosis not present

## 2022-07-12 DIAGNOSIS — E538 Deficiency of other specified B group vitamins: Secondary | ICD-10-CM | POA: Diagnosis not present

## 2022-07-12 DIAGNOSIS — C220 Liver cell carcinoma: Secondary | ICD-10-CM | POA: Diagnosis not present

## 2022-07-12 DIAGNOSIS — Z7984 Long term (current) use of oral hypoglycemic drugs: Secondary | ICD-10-CM | POA: Diagnosis not present

## 2022-07-12 DIAGNOSIS — E1122 Type 2 diabetes mellitus with diabetic chronic kidney disease: Secondary | ICD-10-CM | POA: Diagnosis not present

## 2022-07-12 DIAGNOSIS — Z9181 History of falling: Secondary | ICD-10-CM | POA: Diagnosis not present

## 2022-07-12 DIAGNOSIS — Z7901 Long term (current) use of anticoagulants: Secondary | ICD-10-CM | POA: Diagnosis not present

## 2022-07-12 DIAGNOSIS — I5030 Unspecified diastolic (congestive) heart failure: Secondary | ICD-10-CM | POA: Diagnosis not present

## 2022-07-12 DIAGNOSIS — I480 Paroxysmal atrial fibrillation: Secondary | ICD-10-CM | POA: Diagnosis not present

## 2022-07-13 DIAGNOSIS — M6282 Rhabdomyolysis: Secondary | ICD-10-CM | POA: Diagnosis not present

## 2022-07-13 DIAGNOSIS — E785 Hyperlipidemia, unspecified: Secondary | ICD-10-CM | POA: Diagnosis not present

## 2022-07-13 DIAGNOSIS — Z7984 Long term (current) use of oral hypoglycemic drugs: Secondary | ICD-10-CM | POA: Diagnosis not present

## 2022-07-13 DIAGNOSIS — I4892 Unspecified atrial flutter: Secondary | ICD-10-CM | POA: Diagnosis not present

## 2022-07-13 DIAGNOSIS — I480 Paroxysmal atrial fibrillation: Secondary | ICD-10-CM | POA: Diagnosis not present

## 2022-07-13 DIAGNOSIS — E872 Acidosis, unspecified: Secondary | ICD-10-CM | POA: Diagnosis not present

## 2022-07-13 DIAGNOSIS — E1122 Type 2 diabetes mellitus with diabetic chronic kidney disease: Secondary | ICD-10-CM | POA: Diagnosis not present

## 2022-07-13 DIAGNOSIS — I130 Hypertensive heart and chronic kidney disease with heart failure and stage 1 through stage 4 chronic kidney disease, or unspecified chronic kidney disease: Secondary | ICD-10-CM | POA: Diagnosis not present

## 2022-07-13 DIAGNOSIS — Z7901 Long term (current) use of anticoagulants: Secondary | ICD-10-CM | POA: Diagnosis not present

## 2022-07-13 DIAGNOSIS — H1089 Other conjunctivitis: Secondary | ICD-10-CM | POA: Diagnosis not present

## 2022-07-13 DIAGNOSIS — Z794 Long term (current) use of insulin: Secondary | ICD-10-CM | POA: Diagnosis not present

## 2022-07-13 DIAGNOSIS — C220 Liver cell carcinoma: Secondary | ICD-10-CM | POA: Diagnosis not present

## 2022-07-13 DIAGNOSIS — N183 Chronic kidney disease, stage 3 unspecified: Secondary | ICD-10-CM | POA: Diagnosis not present

## 2022-07-13 DIAGNOSIS — E538 Deficiency of other specified B group vitamins: Secondary | ICD-10-CM | POA: Diagnosis not present

## 2022-07-13 DIAGNOSIS — Z9181 History of falling: Secondary | ICD-10-CM | POA: Diagnosis not present

## 2022-07-13 DIAGNOSIS — I5030 Unspecified diastolic (congestive) heart failure: Secondary | ICD-10-CM | POA: Diagnosis not present

## 2022-07-15 ENCOUNTER — Telehealth: Payer: Self-pay

## 2022-07-15 DIAGNOSIS — E872 Acidosis, unspecified: Secondary | ICD-10-CM | POA: Diagnosis not present

## 2022-07-15 DIAGNOSIS — H1089 Other conjunctivitis: Secondary | ICD-10-CM | POA: Diagnosis not present

## 2022-07-15 DIAGNOSIS — Z7901 Long term (current) use of anticoagulants: Secondary | ICD-10-CM | POA: Diagnosis not present

## 2022-07-15 DIAGNOSIS — Z794 Long term (current) use of insulin: Secondary | ICD-10-CM | POA: Diagnosis not present

## 2022-07-15 DIAGNOSIS — I480 Paroxysmal atrial fibrillation: Secondary | ICD-10-CM | POA: Diagnosis not present

## 2022-07-15 DIAGNOSIS — C220 Liver cell carcinoma: Secondary | ICD-10-CM | POA: Diagnosis not present

## 2022-07-15 DIAGNOSIS — E1122 Type 2 diabetes mellitus with diabetic chronic kidney disease: Secondary | ICD-10-CM | POA: Diagnosis not present

## 2022-07-15 DIAGNOSIS — Z9181 History of falling: Secondary | ICD-10-CM | POA: Diagnosis not present

## 2022-07-15 DIAGNOSIS — E785 Hyperlipidemia, unspecified: Secondary | ICD-10-CM | POA: Diagnosis not present

## 2022-07-15 DIAGNOSIS — I130 Hypertensive heart and chronic kidney disease with heart failure and stage 1 through stage 4 chronic kidney disease, or unspecified chronic kidney disease: Secondary | ICD-10-CM | POA: Diagnosis not present

## 2022-07-15 DIAGNOSIS — M6282 Rhabdomyolysis: Secondary | ICD-10-CM | POA: Diagnosis not present

## 2022-07-15 DIAGNOSIS — I4892 Unspecified atrial flutter: Secondary | ICD-10-CM | POA: Diagnosis not present

## 2022-07-15 DIAGNOSIS — E538 Deficiency of other specified B group vitamins: Secondary | ICD-10-CM | POA: Diagnosis not present

## 2022-07-15 DIAGNOSIS — Z7984 Long term (current) use of oral hypoglycemic drugs: Secondary | ICD-10-CM | POA: Diagnosis not present

## 2022-07-15 DIAGNOSIS — N183 Chronic kidney disease, stage 3 unspecified: Secondary | ICD-10-CM | POA: Diagnosis not present

## 2022-07-15 DIAGNOSIS — I5030 Unspecified diastolic (congestive) heart failure: Secondary | ICD-10-CM | POA: Diagnosis not present

## 2022-07-15 NOTE — Telephone Encounter (Signed)
Gave verbal order to [facility]  for Occupational therapy to connie [id number] for once a week for 6 week

## 2022-07-19 ENCOUNTER — Other Ambulatory Visit: Payer: Self-pay | Admitting: Physician Assistant

## 2022-07-19 ENCOUNTER — Ambulatory Visit (INDEPENDENT_AMBULATORY_CARE_PROVIDER_SITE_OTHER): Payer: 59 | Admitting: Physician Assistant

## 2022-07-19 ENCOUNTER — Encounter: Payer: Self-pay | Admitting: Physician Assistant

## 2022-07-19 VITALS — BP 134/95 | HR 118 | Temp 98.2°F | Resp 16 | Ht 66.0 in | Wt 173.4 lb

## 2022-07-19 DIAGNOSIS — R7989 Other specified abnormal findings of blood chemistry: Secondary | ICD-10-CM

## 2022-07-19 DIAGNOSIS — E1165 Type 2 diabetes mellitus with hyperglycemia: Secondary | ICD-10-CM

## 2022-07-19 DIAGNOSIS — E119 Type 2 diabetes mellitus without complications: Secondary | ICD-10-CM

## 2022-07-19 DIAGNOSIS — Z794 Long term (current) use of insulin: Secondary | ICD-10-CM | POA: Diagnosis not present

## 2022-07-19 DIAGNOSIS — E538 Deficiency of other specified B group vitamins: Secondary | ICD-10-CM

## 2022-07-19 DIAGNOSIS — D696 Thrombocytopenia, unspecified: Secondary | ICD-10-CM

## 2022-07-19 DIAGNOSIS — E559 Vitamin D deficiency, unspecified: Secondary | ICD-10-CM | POA: Diagnosis not present

## 2022-07-19 DIAGNOSIS — L602 Onychogryphosis: Secondary | ICD-10-CM

## 2022-07-19 DIAGNOSIS — E876 Hypokalemia: Secondary | ICD-10-CM | POA: Diagnosis not present

## 2022-07-19 DIAGNOSIS — E1129 Type 2 diabetes mellitus with other diabetic kidney complication: Secondary | ICD-10-CM

## 2022-07-19 NOTE — Progress Notes (Signed)
[facility] [address] [place], [place] [id number]  Internal MEDICINE  Office Visit Note  Patient Name: Adriana Miller  [id number] 16, 2057  [id number]  Date of Service: 07/28/2022     Chief Complaint  Patient presents with   Hospitalization Follow-up    Rhabdomylosis   Diabetes   Hypertension   Hyperlipidemia     HPI Pt is here for recent hospital follow up. -She was admitted 06/17/22-06/19/22 after a fall which led to rhabdomyolysis. Hospital Course taken from discharge summary: "Adriana Miller is a 67 y.o. 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 Adriana Miller states that she was in her usual state of health when she was walking to her bathroom, and lost her balance.  She states that she was not experiencing any dizziness, vision changes, headache, chest pain, shortness of breath or palpitations prior to the fall or afterwards.  She did hit her face on the ground but denies any loss of consciousness.  Due to generalized weakness, she had difficulty getting up off the ground and ended up laying on her right side for the entire night.  Due to this, she is experiencing significant right arm and leg soreness.  She believes On arrival to the ED, patient was hypertensive at 138/98 with heart rate of 117.  She was saturating at 100% on room air.  She was afebrile at 97.6.  Initial workup remarkable for WBC of 7.7, hemoglobin of 14.4, platelets of 139, potassium 4.0, bicarb 18, creatinine 1.14, anion gap of 20, AST 88, and GFR 52.  CK elevated at 1700.  Lactic acid elevated at 8.3 with downtrend to 5.7.  INR elevated at 1.4.  Urinalysis with ketones, hematuria, proteinuria, small leukocytes and few bacteria.  CT of the head, C-spine,  maxillofacial and abdomen/pelvis were obtained; no acute abnormalities noted.  X-rays of the chest, forearm, humerus and knee were obtained; Notable for low lung volumes with atelectasis and vascular crowding, however no other acute abnormalities noted.  Due to initial concerns for underlying infection, patient started on broad-spectrum antibiotic and IV fluids.  Due to rhabdomyolysis and lactic acidosis, [facility] contacted for admission.  Patient received IV fluid resuscitation with significant improvement in her rhabdomyolysis. She was also seen by physical therapist.  Patient condition improved and she was able to walk around with the nurse.  We discussed options of continuing to have physical therapy evaluation to determine possible discharge destination.   Patient made me and the nurse aware that she does not want to be discharged to a facility accept going home.  She is however open to going home with home health PT. Is patient is significantly better now we will discharge her home today.  Social services have been informed and home PT arrangement is being made.  She has also been counseled on avoidance of alcohol."  Hospital Follow up today: -has PT and OT coming to her home, has home health nursing 1-2 times per week -Has life alert now since falling and was encouraged to wear this -Feeling much better now, does take 28units insulin now, BG in Am is 118 typically.  -Ride didn't come last shceduled visit so mioved to today. Her brother brought her today -Does need some updated blood work and would like her B12 checked along with this. States  she wont go to lab for a few weeks when she can plan transportation and will go ahead and add A1c to this as she will be due in a few weeks. -Only concern today is for podiatry referral. She had previously been referred after losing a toenail, but unfortunately went to ED shortly after this and never had appt. Now she needs additional help with nail management as  a diabetic  Current Medication: Outpatient Encounter Medications as of 07/19/2022  Medication Sig Note   acetaminophen (TYLENOL) 325 MG tablet Take 2 tablets (650 mg total) by mouth every 6 (six) hours as needed for mild pain (or Fever >/= 101).    allopurinol (ZYLOPRIM) 100 MG tablet TAKE 1 TABLET BY MOUTH DAILY    Blood Glucose Monitoring Suppl (ACCU-CHEK GUIDE ME) w/Device KIT Use as directed  dx E11.65    Calcium Carb-Cholecalciferol (CALCIUM 500/D) 500-5 MG-MCG TABS Take 1 tab po daily    Continuous Blood Gluc Receiver (DEXCOM G7 RECEIVER) DEVI Use for continuous blood glucose monitoring  DX E11.65    ergocalciferol (VITAMIN D2) 1.25 MG (50000 UT) capsule Take 1 capsule (50,000 Units total) by mouth once a week.    erythromycin ophthalmic ointment Place into both eyes every 6 (six) hours.    folic acid (FOLVITE) 1 MG tablet Take 1 tablet (1 mg total) by mouth daily.    glucose blood (ACCU-CHEK GUIDE) test strip USE AS INSTRUCTED ONCE A DAILY    Safety Lancets 28G MISC Use as directed once a daily Dx e11.65    spironolactone (ALDACTONE) 25 MG tablet Take 1 tablet (25 mg total) by mouth daily. For cirrhosis of liver    thiamine (VITAMIN B-1) 100 MG tablet Take 1 tablet (100 mg total) by mouth daily.    traZODone (DESYREL) 50 MG tablet TAKE 1 TO 2 TABLETS BY MOUTH AT  BEDTIME FOR INSOMNIA (Patient taking differently: Take 50-100 mg by mouth at bedtime.)    [DISCONTINUED] Alcohol Swabs (B-D SINGLE USE SWABS REGULAR) PADS Use as directed  twice daily    [DISCONTINUED] BD PEN NEEDLE NANO 2ND GEN 32G X 4 MM MISC USE AS DIRECTED WITH TOUJEO    [DISCONTINUED] Continuous Blood Gluc Sensor (DEXCOM G7 SENSOR) MISC Use as directed for continuous glucose monitoring.  Change sensor every 10 days   E11.65    [DISCONTINUED] ELIQUIS 5 MG TABS tablet TAKE 1 TABLET BY MOUTH TWICE A DAY (Patient taking differently: Take 5 mg by mouth 2 (two) times daily.)    [DISCONTINUED] escitalopram (LEXAPRO) 10 MG tablet  TAKE 1 TABLET (10 MG TOTAL) BY MOUTH DAILY. FOR ANXIETY/SLEEP    [DISCONTINUED] furosemide (LASIX) 20 MG tablet TAKE 1 TABLET (20 MG TOTAL) BY MOUTH DAILY. NEED APPT FOR MORE REFILLS    [DISCONTINUED] glimepiride (AMARYL) 1 MG tablet TAKE 1 TABLET BY MOUTH EVERY DAY WITH BREAKFAST (Patient taking differently: Take 1 mg by mouth daily with breakfast.)    [DISCONTINUED] insulin glargine, 1 Unit Dial, (TOUJEO SOLOSTAR) 300 UNIT/ML Solostar Pen INJECT 30 UNITS SUBCUTANEOUSLY IN THE EVENING WITH FOOD (Patient taking differently: Inject 28 Units into the skin daily with supper. INJECT 30 UNITS SUBCUTANEOUSLY IN THE EVENING WITH FOOD)    [DISCONTINUED] metoprolol tartrate (LOPRESSOR) 25 MG tablet TAKE 0.5 TABLETS BY MOUTH 2 TIMES DAILY. (Patient taking differently: Take 12.5 mg by mouth 2 (two) times daily.)    [DISCONTINUED] mirtazapine (REMERON) 7.5 MG tablet TAKE 1 TABLET BY MOUTH EVERYDAY AT BEDTIME (Patient taking differently: Take 7.5 mg by  mouth at bedtime.)    [DISCONTINUED] potassium chloride (KLOR-CON) 8 MEQ tablet Take 1 tablet (8 mEq total) by mouth daily. 06/17/2022: Patient is not sure if she still takes Potassium or not   No facility-administered encounter medications on file as of 07/19/2022.    Surgical History: Past Surgical History:  Procedure Laterality Date   CEREBRAL ANEURYSM REPAIR  1991   COLONOSCOPY WITH PROPOFOL N/A 02/12/2019   Procedure: COLONOSCOPY WITH PROPOFOL;  Surgeon: Toney Reil, MD;  Location: [facility] ENDOSCOPY;  Service: Gastroenterology;  Laterality: N/A;   ESOPHAGOGASTRODUODENOSCOPY (EGD) WITH PROPOFOL N/A 02/12/2019   Procedure: ESOPHAGOGASTRODUODENOSCOPY (EGD) WITH PROPOFOL;  Surgeon: Toney Reil, MD;  Location: [facility] ENDOSCOPY;  Service: Gastroenterology;  Laterality: N/A;   IR ANGIOGRAM SELECTIVE EACH ADDITIONAL VESSEL  09/21/2019   IR ANGIOGRAM SELECTIVE EACH ADDITIONAL VESSEL  09/21/2019   IR ANGIOGRAM SELECTIVE EACH ADDITIONAL VESSEL  09/21/2019   IR  ANGIOGRAM SELECTIVE EACH ADDITIONAL VESSEL  09/21/2019   IR ANGIOGRAM SELECTIVE EACH ADDITIONAL VESSEL  10/02/2019   IR ANGIOGRAM VISCERAL SELECTIVE  09/21/2019   IR ANGIOGRAM VISCERAL SELECTIVE  09/21/2019   IR ANGIOGRAM VISCERAL SELECTIVE  10/02/2019   IR EMBO TUMOR ORGAN ISCHEMIA INFARCT INC GUIDE ROADMAPPING  10/02/2019   IR EMBO TUMOR ORGAN ISCHEMIA INFARCT INC GUIDE ROADMAPPING  10/02/2019   IR RADIOLOGIST EVAL & MGMT  08/28/2019   IR RADIOLOGIST EVAL & MGMT  01/30/2020   IR RADIOLOGIST EVAL & MGMT  04/30/2020   IR RADIOLOGIST EVAL & MGMT  07/30/2020   IR RADIOLOGIST EVAL & MGMT  10/28/2020   IR RADIOLOGIST EVAL & MGMT  02/24/2021   IR RADIOLOGIST EVAL & MGMT  06/08/2021   IR RADIOLOGIST EVAL & MGMT  11/24/2021   IR RADIOLOGIST EVAL & MGMT  05/25/2022   IR US GUIDE VASC ACCESS LEFT  09/21/2019   IR US GUIDE VASC ACCESS LEFT  10/02/2019   OPEN REDUCTION INTERNAL FIXATION (ORIF) DISTAL RADIAL FRACTURE Right 08/06/2021   Procedure: OPEN REDUCTION INTERNAL FIXATION (ORIF) DISTAL RADIAL FRACTURE;  Surgeon: Kennedy Bucker, MD;  Location: [facility] ORS;  Service: Orthopedics;  Laterality: Right;    Medical History: Past Medical History:  Diagnosis Date   Alcoholic cirrhosis of liver without ascites    B12 deficiency 04/05/2017   Blood in stool    Cerebral aneurysm    Chronic kidney disease    Diabetes mellitus without complication    type 2   Gastric erosion with bleeding    Hepatocellular carcinoma    Hyperlipidemia    Hypertension    Lower extremity cellulitis 02/08/2019   Paroxysmal A-fib    Seizure disorder     Family History: Family History  Problem Relation Age of Onset   Heart attack Brother 69    Social History   Socioeconomic History   Marital status: Divorced    Spouse name: Not on file   Number of children: Not on file   Years of education: Not on file   Highest education level: Not on file  Occupational History   Not on file  Tobacco Use   Smoking status: Never   Smokeless  tobacco: Current    Types: Snuff  Vaping Use   Vaping Use: Never used  Substance and Sexual Activity   Alcohol use: Yes    Alcohol/week: 14.0 standard drinks of alcohol    Types: 14 Cans of beer per week    Comment: twice a week (beer)   Drug use: No   Sexual activity:  Not Currently  Other Topics Concern   Not on file  Social History Narrative   Lives alone   Social Determinants of Health   Financial Resource Strain: Not on file  Food Insecurity: No Food Insecurity (06/18/2022)   Hunger Vital Sign    Worried About Running Out of Food in the Last Year: Never true    Ran Out of Food in the Last Year: Never true  Transportation Needs: No Transportation Needs (06/18/2022)   PRAPARE - Administrator, Civil ServiceTransportation    Lack of Transportation (Medical): No    Lack of Transportation (Non-Medical): No  Physical Activity: Not on file  Stress: Not on file  Social Connections: Not on file  Intimate Partner Violence: Not At Risk (06/18/2022)   Humiliation, Afraid, Rape, and Kick questionnaire    Fear of Current or Ex-Partner: No    Emotionally Abused: No    Physically Abused: No    Sexually Abused: No      Review of Systems  Constitutional:  Negative for chills and unexpected weight change.  HENT:  Negative for congestion, rhinorrhea, sneezing and sore throat.   Eyes:  Negative for redness.  Respiratory:  Negative for cough, chest tightness and shortness of breath.   Cardiovascular:  Negative for chest pain and palpitations.  Gastrointestinal:  Negative for abdominal pain, constipation, diarrhea, nausea and vomiting.  Genitourinary:  Negative for dysuria and frequency.  Musculoskeletal:  Positive for arthralgias. Negative for back pain, joint swelling and neck pain.  Skin:  Negative for rash.  Neurological:  Positive for weakness. Negative for tremors and numbness.  Hematological:  Negative for adenopathy. Does not bruise/bleed easily.  Psychiatric/Behavioral:  Negative for behavioral problems  (Depression), sleep disturbance and suicidal ideas. The patient is not nervous/anxious.     Vital Signs: BP (!) 134/95   Pulse (!) 118   Temp 98.2 F (36.8 C)   Resp 16   Ht 5\' 6"  (1.676 m)   Wt 173 lb 6.4 oz (78.7 kg)   SpO2 99%   BMI 27.99 kg/m    Physical Exam Constitutional:      General: She is not in acute distress.    Appearance: She is well-developed. She is not diaphoretic.  HENT:     Head: Normocephalic and atraumatic.     Mouth/Throat:     Pharynx: No oropharyngeal exudate.  Eyes:     Pupils: Pupils are equal, round, and reactive to light.  Neck:     Thyroid: No thyromegaly.     Vascular: No JVD.     Trachea: No tracheal deviation.  Cardiovascular:     Rate and Rhythm: Normal rate and regular rhythm.     Heart sounds: Normal heart sounds. No murmur heard.    No friction rub. No gallop.  Pulmonary:     Effort: Pulmonary effort is normal. No respiratory distress.     Breath sounds: No wheezing or rales.  Chest:     Chest wall: No tenderness.  Abdominal:     General: Bowel sounds are normal.     Palpations: Abdomen is soft.  Musculoskeletal:        General: Normal range of motion.     Cervical back: Normal range of motion and neck supple.  Lymphadenopathy:     Cervical: No cervical adenopathy.  Skin:    General: Skin is warm and dry.  Neurological:     Mental Status: She is alert and oriented to person, place, and time.     Cranial Nerves:  No cranial nerve deficit.     Comments: Utilizes cane for mobility  Psychiatric:        Behavior: Behavior normal.        Thought Content: Thought content normal.        Judgment: Judgment normal.       Assessment/Plan: 1. Type 2 diabetes mellitus without complication, with long-term current use of insulin Will update A1c when due in a few weeks and will continue current medications and monitoring.  - Hgb A1C w/o eAG  2. Encounter for diabetic foot exam Does need referral to podiatry to help with nail  management as a diabetic - Ambulatory referral to Podiatry  3. Long toenail - Ambulatory referral to Podiatry  4. Hypokalemia - Comprehensive metabolic panel  5. Low serum calcium - Comprehensive metabolic panel  6. Low platelet count - CBC w/Diff/Platelet  7. Vitamin D deficiency - VITAMIN D 25 Hydroxy (Vit-D Deficiency, Fractures)  8. B12 deficiency - B12 and Folate Panel   General Counseling: Adriana Miller verbalizes understanding of the findings of todays visit and agrees with plan of treatment. I have discussed any further diagnostic evaluation that may be needed or ordered today. We also reviewed her medications today. she has been encouraged to call the office with any questions or concerns that should arise related to todays visit.    Counseling:    Orders Placed This Encounter  Procedures   CBC w/Diff/Platelet   Comprehensive metabolic panel   Hgb A1C w/o eAG   B12 and Folate Panel   VITAMIN D 25 Hydroxy (Vit-D Deficiency, Fractures)   Ambulatory referral to Podiatry    This patient was seen by Lynn Ito, PA-C in collaboration with Dr. Beverely Risen as a part of collaborative care agreement.   I have reviewed all medical records from hospital follow up including radiology reports and consults from other physicians. Appropriate follow up diagnostics will be scheduled as needed. Patient/ Family understands the plan of treatment. Time spent 35 minutes.   Dr Lyndon Code, MD Internal Medicine

## 2022-07-20 DIAGNOSIS — H1089 Other conjunctivitis: Secondary | ICD-10-CM | POA: Diagnosis not present

## 2022-07-20 DIAGNOSIS — I130 Hypertensive heart and chronic kidney disease with heart failure and stage 1 through stage 4 chronic kidney disease, or unspecified chronic kidney disease: Secondary | ICD-10-CM | POA: Diagnosis not present

## 2022-07-20 DIAGNOSIS — N183 Chronic kidney disease, stage 3 unspecified: Secondary | ICD-10-CM | POA: Diagnosis not present

## 2022-07-20 DIAGNOSIS — E1122 Type 2 diabetes mellitus with diabetic chronic kidney disease: Secondary | ICD-10-CM | POA: Diagnosis not present

## 2022-07-20 DIAGNOSIS — I5030 Unspecified diastolic (congestive) heart failure: Secondary | ICD-10-CM | POA: Diagnosis not present

## 2022-07-20 DIAGNOSIS — Z7984 Long term (current) use of oral hypoglycemic drugs: Secondary | ICD-10-CM | POA: Diagnosis not present

## 2022-07-20 DIAGNOSIS — E785 Hyperlipidemia, unspecified: Secondary | ICD-10-CM | POA: Diagnosis not present

## 2022-07-20 DIAGNOSIS — I4892 Unspecified atrial flutter: Secondary | ICD-10-CM | POA: Diagnosis not present

## 2022-07-20 DIAGNOSIS — C220 Liver cell carcinoma: Secondary | ICD-10-CM | POA: Diagnosis not present

## 2022-07-20 DIAGNOSIS — Z7901 Long term (current) use of anticoagulants: Secondary | ICD-10-CM | POA: Diagnosis not present

## 2022-07-20 DIAGNOSIS — Z794 Long term (current) use of insulin: Secondary | ICD-10-CM | POA: Diagnosis not present

## 2022-07-20 DIAGNOSIS — E538 Deficiency of other specified B group vitamins: Secondary | ICD-10-CM | POA: Diagnosis not present

## 2022-07-20 DIAGNOSIS — I480 Paroxysmal atrial fibrillation: Secondary | ICD-10-CM | POA: Diagnosis not present

## 2022-07-20 DIAGNOSIS — E872 Acidosis, unspecified: Secondary | ICD-10-CM | POA: Diagnosis not present

## 2022-07-20 DIAGNOSIS — M6282 Rhabdomyolysis: Secondary | ICD-10-CM | POA: Diagnosis not present

## 2022-07-20 DIAGNOSIS — Z9181 History of falling: Secondary | ICD-10-CM | POA: Diagnosis not present

## 2022-07-20 NOTE — Telephone Encounter (Signed)
Can you please its ok to send

## 2022-07-21 DIAGNOSIS — M6282 Rhabdomyolysis: Secondary | ICD-10-CM | POA: Diagnosis not present

## 2022-07-21 DIAGNOSIS — C220 Liver cell carcinoma: Secondary | ICD-10-CM | POA: Diagnosis not present

## 2022-07-21 DIAGNOSIS — N183 Chronic kidney disease, stage 3 unspecified: Secondary | ICD-10-CM | POA: Diagnosis not present

## 2022-07-21 DIAGNOSIS — E872 Acidosis, unspecified: Secondary | ICD-10-CM | POA: Diagnosis not present

## 2022-07-21 DIAGNOSIS — I4892 Unspecified atrial flutter: Secondary | ICD-10-CM | POA: Diagnosis not present

## 2022-07-21 DIAGNOSIS — Z794 Long term (current) use of insulin: Secondary | ICD-10-CM | POA: Diagnosis not present

## 2022-07-21 DIAGNOSIS — H1089 Other conjunctivitis: Secondary | ICD-10-CM | POA: Diagnosis not present

## 2022-07-21 DIAGNOSIS — E785 Hyperlipidemia, unspecified: Secondary | ICD-10-CM | POA: Diagnosis not present

## 2022-07-21 DIAGNOSIS — I480 Paroxysmal atrial fibrillation: Secondary | ICD-10-CM | POA: Diagnosis not present

## 2022-07-21 DIAGNOSIS — I5030 Unspecified diastolic (congestive) heart failure: Secondary | ICD-10-CM | POA: Diagnosis not present

## 2022-07-21 DIAGNOSIS — Z7984 Long term (current) use of oral hypoglycemic drugs: Secondary | ICD-10-CM | POA: Diagnosis not present

## 2022-07-21 DIAGNOSIS — E1122 Type 2 diabetes mellitus with diabetic chronic kidney disease: Secondary | ICD-10-CM | POA: Diagnosis not present

## 2022-07-21 DIAGNOSIS — E538 Deficiency of other specified B group vitamins: Secondary | ICD-10-CM | POA: Diagnosis not present

## 2022-07-21 DIAGNOSIS — I130 Hypertensive heart and chronic kidney disease with heart failure and stage 1 through stage 4 chronic kidney disease, or unspecified chronic kidney disease: Secondary | ICD-10-CM | POA: Diagnosis not present

## 2022-07-21 DIAGNOSIS — Z7901 Long term (current) use of anticoagulants: Secondary | ICD-10-CM | POA: Diagnosis not present

## 2022-07-21 DIAGNOSIS — Z9181 History of falling: Secondary | ICD-10-CM | POA: Diagnosis not present

## 2022-07-22 ENCOUNTER — Other Ambulatory Visit: Payer: Self-pay | Admitting: Physician Assistant

## 2022-07-22 DIAGNOSIS — E538 Deficiency of other specified B group vitamins: Secondary | ICD-10-CM | POA: Diagnosis not present

## 2022-07-22 DIAGNOSIS — I5030 Unspecified diastolic (congestive) heart failure: Secondary | ICD-10-CM | POA: Diagnosis not present

## 2022-07-22 DIAGNOSIS — E1165 Type 2 diabetes mellitus with hyperglycemia: Secondary | ICD-10-CM

## 2022-07-22 DIAGNOSIS — E872 Acidosis, unspecified: Secondary | ICD-10-CM | POA: Diagnosis not present

## 2022-07-22 DIAGNOSIS — E1122 Type 2 diabetes mellitus with diabetic chronic kidney disease: Secondary | ICD-10-CM | POA: Diagnosis not present

## 2022-07-22 DIAGNOSIS — E785 Hyperlipidemia, unspecified: Secondary | ICD-10-CM | POA: Diagnosis not present

## 2022-07-22 DIAGNOSIS — Z9181 History of falling: Secondary | ICD-10-CM | POA: Diagnosis not present

## 2022-07-22 DIAGNOSIS — I4892 Unspecified atrial flutter: Secondary | ICD-10-CM | POA: Diagnosis not present

## 2022-07-22 DIAGNOSIS — C220 Liver cell carcinoma: Secondary | ICD-10-CM | POA: Diagnosis not present

## 2022-07-22 DIAGNOSIS — Z794 Long term (current) use of insulin: Secondary | ICD-10-CM | POA: Diagnosis not present

## 2022-07-22 DIAGNOSIS — I480 Paroxysmal atrial fibrillation: Secondary | ICD-10-CM | POA: Diagnosis not present

## 2022-07-22 DIAGNOSIS — Z7901 Long term (current) use of anticoagulants: Secondary | ICD-10-CM | POA: Diagnosis not present

## 2022-07-22 DIAGNOSIS — I130 Hypertensive heart and chronic kidney disease with heart failure and stage 1 through stage 4 chronic kidney disease, or unspecified chronic kidney disease: Secondary | ICD-10-CM | POA: Diagnosis not present

## 2022-07-22 DIAGNOSIS — N183 Chronic kidney disease, stage 3 unspecified: Secondary | ICD-10-CM | POA: Diagnosis not present

## 2022-07-22 DIAGNOSIS — H1089 Other conjunctivitis: Secondary | ICD-10-CM | POA: Diagnosis not present

## 2022-07-22 DIAGNOSIS — M6282 Rhabdomyolysis: Secondary | ICD-10-CM | POA: Diagnosis not present

## 2022-07-22 DIAGNOSIS — Z7984 Long term (current) use of oral hypoglycemic drugs: Secondary | ICD-10-CM | POA: Diagnosis not present

## 2022-07-23 ENCOUNTER — Other Ambulatory Visit: Payer: Self-pay

## 2022-07-23 DIAGNOSIS — E1165 Type 2 diabetes mellitus with hyperglycemia: Secondary | ICD-10-CM

## 2022-07-23 MED ORDER — TOUJEO SOLOSTAR 300 UNIT/ML ~~LOC~~ SOPN: 28.0000 [IU] | PEN_INJECTOR | Freq: Every day | SUBCUTANEOUS | 2 refills | Status: DC

## 2022-07-26 ENCOUNTER — Other Ambulatory Visit: Payer: Self-pay | Admitting: Physician Assistant

## 2022-07-26 ENCOUNTER — Other Ambulatory Visit: Payer: Self-pay

## 2022-07-26 ENCOUNTER — Other Ambulatory Visit: Payer: Self-pay | Admitting: *Deleted

## 2022-07-26 DIAGNOSIS — N183 Chronic kidney disease, stage 3 unspecified: Secondary | ICD-10-CM | POA: Diagnosis not present

## 2022-07-26 DIAGNOSIS — Z7901 Long term (current) use of anticoagulants: Secondary | ICD-10-CM | POA: Diagnosis not present

## 2022-07-26 DIAGNOSIS — I130 Hypertensive heart and chronic kidney disease with heart failure and stage 1 through stage 4 chronic kidney disease, or unspecified chronic kidney disease: Secondary | ICD-10-CM | POA: Diagnosis not present

## 2022-07-26 DIAGNOSIS — Z794 Long term (current) use of insulin: Secondary | ICD-10-CM | POA: Diagnosis not present

## 2022-07-26 DIAGNOSIS — G2581 Restless legs syndrome: Secondary | ICD-10-CM

## 2022-07-26 DIAGNOSIS — E1122 Type 2 diabetes mellitus with diabetic chronic kidney disease: Secondary | ICD-10-CM | POA: Diagnosis not present

## 2022-07-26 DIAGNOSIS — C220 Liver cell carcinoma: Secondary | ICD-10-CM | POA: Diagnosis not present

## 2022-07-26 DIAGNOSIS — I5030 Unspecified diastolic (congestive) heart failure: Secondary | ICD-10-CM | POA: Diagnosis not present

## 2022-07-26 DIAGNOSIS — E785 Hyperlipidemia, unspecified: Secondary | ICD-10-CM | POA: Diagnosis not present

## 2022-07-26 DIAGNOSIS — E1129 Type 2 diabetes mellitus with other diabetic kidney complication: Secondary | ICD-10-CM

## 2022-07-26 DIAGNOSIS — M6282 Rhabdomyolysis: Secondary | ICD-10-CM | POA: Diagnosis not present

## 2022-07-26 DIAGNOSIS — I480 Paroxysmal atrial fibrillation: Secondary | ICD-10-CM | POA: Diagnosis not present

## 2022-07-26 DIAGNOSIS — Z7984 Long term (current) use of oral hypoglycemic drugs: Secondary | ICD-10-CM | POA: Diagnosis not present

## 2022-07-26 DIAGNOSIS — E538 Deficiency of other specified B group vitamins: Secondary | ICD-10-CM | POA: Diagnosis not present

## 2022-07-26 DIAGNOSIS — F320 Major depressive disorder, single episode, mild: Secondary | ICD-10-CM

## 2022-07-26 DIAGNOSIS — E872 Acidosis, unspecified: Secondary | ICD-10-CM | POA: Diagnosis not present

## 2022-07-26 DIAGNOSIS — Z9181 History of falling: Secondary | ICD-10-CM | POA: Diagnosis not present

## 2022-07-26 DIAGNOSIS — H1089 Other conjunctivitis: Secondary | ICD-10-CM | POA: Diagnosis not present

## 2022-07-26 DIAGNOSIS — I4892 Unspecified atrial flutter: Secondary | ICD-10-CM | POA: Diagnosis not present

## 2022-07-26 MED ORDER — BD PEN NEEDLE NANO 2ND GEN 32G X 4 MM MISC
3 refills | Status: DC
Start: 2022-07-26 — End: 2022-12-31

## 2022-07-26 MED ORDER — APIXABAN 5 MG PO TABS
5.0000 mg | ORAL_TABLET | Freq: Two times a day (BID) | ORAL | 1 refills | Status: DC
Start: 2022-07-26 — End: 2022-09-20

## 2022-07-26 MED ORDER — DEXCOM G7 SENSOR MISC: 3 refills | Status: DC

## 2022-07-26 MED ORDER — POTASSIUM CHLORIDE ER 8 MEQ PO TBCR: 8.0000 meq | EXTENDED_RELEASE_TABLET | Freq: Every day | ORAL | 1 refills | Status: DC

## 2022-07-26 MED ORDER — FUROSEMIDE 20 MG PO TABS: ORAL_TABLET | ORAL | 1 refills | Status: DC

## 2022-07-26 NOTE — Telephone Encounter (Signed)
Prescription refill request for Eliquis received. Indication: afib  Last office visit: Dunn, 05/14/2022 Age: 67 yo  Weight: 78.7 kg  Scr: 0.99, 06/19/2022  Refill sent.

## 2022-07-27 DIAGNOSIS — Z9181 History of falling: Secondary | ICD-10-CM | POA: Diagnosis not present

## 2022-07-27 DIAGNOSIS — E1122 Type 2 diabetes mellitus with diabetic chronic kidney disease: Secondary | ICD-10-CM | POA: Diagnosis not present

## 2022-07-27 DIAGNOSIS — E872 Acidosis, unspecified: Secondary | ICD-10-CM | POA: Diagnosis not present

## 2022-07-27 DIAGNOSIS — I4892 Unspecified atrial flutter: Secondary | ICD-10-CM | POA: Diagnosis not present

## 2022-07-27 DIAGNOSIS — Z794 Long term (current) use of insulin: Secondary | ICD-10-CM | POA: Diagnosis not present

## 2022-07-27 DIAGNOSIS — H1089 Other conjunctivitis: Secondary | ICD-10-CM | POA: Diagnosis not present

## 2022-07-27 DIAGNOSIS — I130 Hypertensive heart and chronic kidney disease with heart failure and stage 1 through stage 4 chronic kidney disease, or unspecified chronic kidney disease: Secondary | ICD-10-CM | POA: Diagnosis not present

## 2022-07-27 DIAGNOSIS — Z7901 Long term (current) use of anticoagulants: Secondary | ICD-10-CM | POA: Diagnosis not present

## 2022-07-27 DIAGNOSIS — E538 Deficiency of other specified B group vitamins: Secondary | ICD-10-CM | POA: Diagnosis not present

## 2022-07-27 DIAGNOSIS — Z7984 Long term (current) use of oral hypoglycemic drugs: Secondary | ICD-10-CM | POA: Diagnosis not present

## 2022-07-27 DIAGNOSIS — M6282 Rhabdomyolysis: Secondary | ICD-10-CM | POA: Diagnosis not present

## 2022-07-27 DIAGNOSIS — I5030 Unspecified diastolic (congestive) heart failure: Secondary | ICD-10-CM | POA: Diagnosis not present

## 2022-07-27 DIAGNOSIS — C220 Liver cell carcinoma: Secondary | ICD-10-CM | POA: Diagnosis not present

## 2022-07-27 DIAGNOSIS — E785 Hyperlipidemia, unspecified: Secondary | ICD-10-CM | POA: Diagnosis not present

## 2022-07-27 DIAGNOSIS — N183 Chronic kidney disease, stage 3 unspecified: Secondary | ICD-10-CM | POA: Diagnosis not present

## 2022-07-27 DIAGNOSIS — I480 Paroxysmal atrial fibrillation: Secondary | ICD-10-CM | POA: Diagnosis not present

## 2022-07-28 ENCOUNTER — Other Ambulatory Visit: Payer: Self-pay

## 2022-07-28 DIAGNOSIS — H1089 Other conjunctivitis: Secondary | ICD-10-CM | POA: Diagnosis not present

## 2022-07-28 DIAGNOSIS — I4892 Unspecified atrial flutter: Secondary | ICD-10-CM | POA: Diagnosis not present

## 2022-07-28 DIAGNOSIS — Z9181 History of falling: Secondary | ICD-10-CM | POA: Diagnosis not present

## 2022-07-28 DIAGNOSIS — E538 Deficiency of other specified B group vitamins: Secondary | ICD-10-CM | POA: Diagnosis not present

## 2022-07-28 DIAGNOSIS — I5030 Unspecified diastolic (congestive) heart failure: Secondary | ICD-10-CM | POA: Diagnosis not present

## 2022-07-28 DIAGNOSIS — N183 Chronic kidney disease, stage 3 unspecified: Secondary | ICD-10-CM | POA: Diagnosis not present

## 2022-07-28 DIAGNOSIS — I480 Paroxysmal atrial fibrillation: Secondary | ICD-10-CM | POA: Diagnosis not present

## 2022-07-28 DIAGNOSIS — C220 Liver cell carcinoma: Secondary | ICD-10-CM | POA: Diagnosis not present

## 2022-07-28 DIAGNOSIS — E785 Hyperlipidemia, unspecified: Secondary | ICD-10-CM | POA: Diagnosis not present

## 2022-07-28 DIAGNOSIS — I130 Hypertensive heart and chronic kidney disease with heart failure and stage 1 through stage 4 chronic kidney disease, or unspecified chronic kidney disease: Secondary | ICD-10-CM | POA: Diagnosis not present

## 2022-07-28 DIAGNOSIS — Z7901 Long term (current) use of anticoagulants: Secondary | ICD-10-CM | POA: Diagnosis not present

## 2022-07-28 DIAGNOSIS — Z794 Long term (current) use of insulin: Secondary | ICD-10-CM | POA: Diagnosis not present

## 2022-07-28 DIAGNOSIS — E1122 Type 2 diabetes mellitus with diabetic chronic kidney disease: Secondary | ICD-10-CM | POA: Diagnosis not present

## 2022-07-28 DIAGNOSIS — M6282 Rhabdomyolysis: Secondary | ICD-10-CM | POA: Diagnosis not present

## 2022-07-28 DIAGNOSIS — Z7984 Long term (current) use of oral hypoglycemic drugs: Secondary | ICD-10-CM | POA: Diagnosis not present

## 2022-07-28 DIAGNOSIS — E872 Acidosis, unspecified: Secondary | ICD-10-CM | POA: Diagnosis not present

## 2022-07-28 MED ORDER — POTASSIUM CHLORIDE ER 8 MEQ PO TBCR: 8.0000 meq | EXTENDED_RELEASE_TABLET | ORAL | 1 refills | Status: DC

## 2022-07-28 MED ORDER — FUROSEMIDE 20 MG PO TABS: ORAL_TABLET | ORAL | 1 refills | Status: DC

## 2022-07-30 ENCOUNTER — Other Ambulatory Visit: Payer: Self-pay

## 2022-07-30 MED ORDER — ACCU-CHEK GUIDE ME W/DEVICE KIT: PACK | 0 refills | Status: DC

## 2022-07-30 MED ORDER — SAFETY LANCETS 28G MISC: 1 refills | Status: DC

## 2022-07-30 MED ORDER — ACCU-CHEK GUIDE VI STRP: ORAL_STRIP | 1 refills | Status: DC

## 2022-08-02 DIAGNOSIS — E785 Hyperlipidemia, unspecified: Secondary | ICD-10-CM | POA: Diagnosis not present

## 2022-08-02 DIAGNOSIS — I480 Paroxysmal atrial fibrillation: Secondary | ICD-10-CM | POA: Diagnosis not present

## 2022-08-02 DIAGNOSIS — E538 Deficiency of other specified B group vitamins: Secondary | ICD-10-CM | POA: Diagnosis not present

## 2022-08-02 DIAGNOSIS — I4892 Unspecified atrial flutter: Secondary | ICD-10-CM | POA: Diagnosis not present

## 2022-08-02 DIAGNOSIS — Z794 Long term (current) use of insulin: Secondary | ICD-10-CM | POA: Diagnosis not present

## 2022-08-02 DIAGNOSIS — Z7901 Long term (current) use of anticoagulants: Secondary | ICD-10-CM | POA: Diagnosis not present

## 2022-08-02 DIAGNOSIS — C220 Liver cell carcinoma: Secondary | ICD-10-CM | POA: Diagnosis not present

## 2022-08-02 DIAGNOSIS — N183 Chronic kidney disease, stage 3 unspecified: Secondary | ICD-10-CM | POA: Diagnosis not present

## 2022-08-02 DIAGNOSIS — M6282 Rhabdomyolysis: Secondary | ICD-10-CM | POA: Diagnosis not present

## 2022-08-02 DIAGNOSIS — E872 Acidosis, unspecified: Secondary | ICD-10-CM | POA: Diagnosis not present

## 2022-08-02 DIAGNOSIS — I5030 Unspecified diastolic (congestive) heart failure: Secondary | ICD-10-CM | POA: Diagnosis not present

## 2022-08-02 DIAGNOSIS — Z7984 Long term (current) use of oral hypoglycemic drugs: Secondary | ICD-10-CM | POA: Diagnosis not present

## 2022-08-02 DIAGNOSIS — E1122 Type 2 diabetes mellitus with diabetic chronic kidney disease: Secondary | ICD-10-CM | POA: Diagnosis not present

## 2022-08-02 DIAGNOSIS — H1089 Other conjunctivitis: Secondary | ICD-10-CM | POA: Diagnosis not present

## 2022-08-02 DIAGNOSIS — Z9181 History of falling: Secondary | ICD-10-CM | POA: Diagnosis not present

## 2022-08-02 DIAGNOSIS — I130 Hypertensive heart and chronic kidney disease with heart failure and stage 1 through stage 4 chronic kidney disease, or unspecified chronic kidney disease: Secondary | ICD-10-CM | POA: Diagnosis not present

## 2022-08-03 ENCOUNTER — Other Ambulatory Visit: Payer: Self-pay

## 2022-08-03 DIAGNOSIS — E785 Hyperlipidemia, unspecified: Secondary | ICD-10-CM | POA: Diagnosis not present

## 2022-08-03 DIAGNOSIS — H1089 Other conjunctivitis: Secondary | ICD-10-CM | POA: Diagnosis not present

## 2022-08-03 DIAGNOSIS — C220 Liver cell carcinoma: Secondary | ICD-10-CM | POA: Diagnosis not present

## 2022-08-03 DIAGNOSIS — N183 Chronic kidney disease, stage 3 unspecified: Secondary | ICD-10-CM | POA: Diagnosis not present

## 2022-08-03 DIAGNOSIS — I5030 Unspecified diastolic (congestive) heart failure: Secondary | ICD-10-CM | POA: Diagnosis not present

## 2022-08-03 DIAGNOSIS — Z7984 Long term (current) use of oral hypoglycemic drugs: Secondary | ICD-10-CM | POA: Diagnosis not present

## 2022-08-03 DIAGNOSIS — Z7901 Long term (current) use of anticoagulants: Secondary | ICD-10-CM | POA: Diagnosis not present

## 2022-08-03 DIAGNOSIS — I130 Hypertensive heart and chronic kidney disease with heart failure and stage 1 through stage 4 chronic kidney disease, or unspecified chronic kidney disease: Secondary | ICD-10-CM | POA: Diagnosis not present

## 2022-08-03 DIAGNOSIS — I480 Paroxysmal atrial fibrillation: Secondary | ICD-10-CM | POA: Diagnosis not present

## 2022-08-03 DIAGNOSIS — Z9181 History of falling: Secondary | ICD-10-CM | POA: Diagnosis not present

## 2022-08-03 DIAGNOSIS — I4892 Unspecified atrial flutter: Secondary | ICD-10-CM | POA: Diagnosis not present

## 2022-08-03 DIAGNOSIS — E538 Deficiency of other specified B group vitamins: Secondary | ICD-10-CM | POA: Diagnosis not present

## 2022-08-03 DIAGNOSIS — Z794 Long term (current) use of insulin: Secondary | ICD-10-CM | POA: Diagnosis not present

## 2022-08-03 DIAGNOSIS — E872 Acidosis, unspecified: Secondary | ICD-10-CM | POA: Diagnosis not present

## 2022-08-03 DIAGNOSIS — E1122 Type 2 diabetes mellitus with diabetic chronic kidney disease: Secondary | ICD-10-CM | POA: Diagnosis not present

## 2022-08-03 DIAGNOSIS — M6282 Rhabdomyolysis: Secondary | ICD-10-CM | POA: Diagnosis not present

## 2022-08-03 MED ORDER — ACCU-CHEK GUIDE VI STRP: ORAL_STRIP | 1 refills | Status: AC

## 2022-08-07 ENCOUNTER — Other Ambulatory Visit: Payer: Self-pay | Admitting: Physician Assistant

## 2022-08-07 DIAGNOSIS — K7031 Alcoholic cirrhosis of liver with ascites: Secondary | ICD-10-CM

## 2022-08-09 DIAGNOSIS — I480 Paroxysmal atrial fibrillation: Secondary | ICD-10-CM | POA: Diagnosis not present

## 2022-08-09 DIAGNOSIS — M6282 Rhabdomyolysis: Secondary | ICD-10-CM | POA: Diagnosis not present

## 2022-08-09 DIAGNOSIS — E785 Hyperlipidemia, unspecified: Secondary | ICD-10-CM | POA: Diagnosis not present

## 2022-08-09 DIAGNOSIS — Z7901 Long term (current) use of anticoagulants: Secondary | ICD-10-CM | POA: Diagnosis not present

## 2022-08-09 DIAGNOSIS — I4892 Unspecified atrial flutter: Secondary | ICD-10-CM | POA: Diagnosis not present

## 2022-08-09 DIAGNOSIS — Z7984 Long term (current) use of oral hypoglycemic drugs: Secondary | ICD-10-CM | POA: Diagnosis not present

## 2022-08-09 DIAGNOSIS — E1122 Type 2 diabetes mellitus with diabetic chronic kidney disease: Secondary | ICD-10-CM | POA: Diagnosis not present

## 2022-08-09 DIAGNOSIS — I5030 Unspecified diastolic (congestive) heart failure: Secondary | ICD-10-CM | POA: Diagnosis not present

## 2022-08-09 DIAGNOSIS — C220 Liver cell carcinoma: Secondary | ICD-10-CM | POA: Diagnosis not present

## 2022-08-09 DIAGNOSIS — E538 Deficiency of other specified B group vitamins: Secondary | ICD-10-CM | POA: Diagnosis not present

## 2022-08-09 DIAGNOSIS — Z9181 History of falling: Secondary | ICD-10-CM | POA: Diagnosis not present

## 2022-08-09 DIAGNOSIS — Z794 Long term (current) use of insulin: Secondary | ICD-10-CM | POA: Diagnosis not present

## 2022-08-09 DIAGNOSIS — I130 Hypertensive heart and chronic kidney disease with heart failure and stage 1 through stage 4 chronic kidney disease, or unspecified chronic kidney disease: Secondary | ICD-10-CM | POA: Diagnosis not present

## 2022-08-09 DIAGNOSIS — N183 Chronic kidney disease, stage 3 unspecified: Secondary | ICD-10-CM | POA: Diagnosis not present

## 2022-08-09 DIAGNOSIS — H1089 Other conjunctivitis: Secondary | ICD-10-CM | POA: Diagnosis not present

## 2022-08-09 DIAGNOSIS — E872 Acidosis, unspecified: Secondary | ICD-10-CM | POA: Diagnosis not present

## 2022-08-10 DIAGNOSIS — Z7984 Long term (current) use of oral hypoglycemic drugs: Secondary | ICD-10-CM | POA: Diagnosis not present

## 2022-08-10 DIAGNOSIS — Z794 Long term (current) use of insulin: Secondary | ICD-10-CM | POA: Diagnosis not present

## 2022-08-10 DIAGNOSIS — I480 Paroxysmal atrial fibrillation: Secondary | ICD-10-CM | POA: Diagnosis not present

## 2022-08-10 DIAGNOSIS — E785 Hyperlipidemia, unspecified: Secondary | ICD-10-CM | POA: Diagnosis not present

## 2022-08-10 DIAGNOSIS — H1089 Other conjunctivitis: Secondary | ICD-10-CM | POA: Diagnosis not present

## 2022-08-10 DIAGNOSIS — C220 Liver cell carcinoma: Secondary | ICD-10-CM | POA: Diagnosis not present

## 2022-08-10 DIAGNOSIS — E1122 Type 2 diabetes mellitus with diabetic chronic kidney disease: Secondary | ICD-10-CM | POA: Diagnosis not present

## 2022-08-10 DIAGNOSIS — Z9181 History of falling: Secondary | ICD-10-CM | POA: Diagnosis not present

## 2022-08-10 DIAGNOSIS — Z7901 Long term (current) use of anticoagulants: Secondary | ICD-10-CM | POA: Diagnosis not present

## 2022-08-10 DIAGNOSIS — M6282 Rhabdomyolysis: Secondary | ICD-10-CM | POA: Diagnosis not present

## 2022-08-10 DIAGNOSIS — E538 Deficiency of other specified B group vitamins: Secondary | ICD-10-CM | POA: Diagnosis not present

## 2022-08-10 DIAGNOSIS — I4892 Unspecified atrial flutter: Secondary | ICD-10-CM | POA: Diagnosis not present

## 2022-08-10 DIAGNOSIS — N183 Chronic kidney disease, stage 3 unspecified: Secondary | ICD-10-CM | POA: Diagnosis not present

## 2022-08-10 DIAGNOSIS — E872 Acidosis, unspecified: Secondary | ICD-10-CM | POA: Diagnosis not present

## 2022-08-10 DIAGNOSIS — I5030 Unspecified diastolic (congestive) heart failure: Secondary | ICD-10-CM | POA: Diagnosis not present

## 2022-08-10 DIAGNOSIS — I130 Hypertensive heart and chronic kidney disease with heart failure and stage 1 through stage 4 chronic kidney disease, or unspecified chronic kidney disease: Secondary | ICD-10-CM | POA: Diagnosis not present

## 2022-08-11 DIAGNOSIS — I5030 Unspecified diastolic (congestive) heart failure: Secondary | ICD-10-CM | POA: Diagnosis not present

## 2022-08-11 DIAGNOSIS — I130 Hypertensive heart and chronic kidney disease with heart failure and stage 1 through stage 4 chronic kidney disease, or unspecified chronic kidney disease: Secondary | ICD-10-CM | POA: Diagnosis not present

## 2022-08-11 DIAGNOSIS — M6282 Rhabdomyolysis: Secondary | ICD-10-CM | POA: Diagnosis not present

## 2022-08-11 DIAGNOSIS — I480 Paroxysmal atrial fibrillation: Secondary | ICD-10-CM | POA: Diagnosis not present

## 2022-08-11 DIAGNOSIS — E538 Deficiency of other specified B group vitamins: Secondary | ICD-10-CM | POA: Diagnosis not present

## 2022-08-11 DIAGNOSIS — Z9181 History of falling: Secondary | ICD-10-CM | POA: Diagnosis not present

## 2022-08-11 DIAGNOSIS — Z7901 Long term (current) use of anticoagulants: Secondary | ICD-10-CM | POA: Diagnosis not present

## 2022-08-11 DIAGNOSIS — N183 Chronic kidney disease, stage 3 unspecified: Secondary | ICD-10-CM | POA: Diagnosis not present

## 2022-08-11 DIAGNOSIS — H1089 Other conjunctivitis: Secondary | ICD-10-CM | POA: Diagnosis not present

## 2022-08-11 DIAGNOSIS — E1122 Type 2 diabetes mellitus with diabetic chronic kidney disease: Secondary | ICD-10-CM | POA: Diagnosis not present

## 2022-08-11 DIAGNOSIS — C220 Liver cell carcinoma: Secondary | ICD-10-CM | POA: Diagnosis not present

## 2022-08-11 DIAGNOSIS — I4892 Unspecified atrial flutter: Secondary | ICD-10-CM | POA: Diagnosis not present

## 2022-08-11 DIAGNOSIS — Z794 Long term (current) use of insulin: Secondary | ICD-10-CM | POA: Diagnosis not present

## 2022-08-11 DIAGNOSIS — E785 Hyperlipidemia, unspecified: Secondary | ICD-10-CM | POA: Diagnosis not present

## 2022-08-11 DIAGNOSIS — E872 Acidosis, unspecified: Secondary | ICD-10-CM | POA: Diagnosis not present

## 2022-08-11 DIAGNOSIS — Z7984 Long term (current) use of oral hypoglycemic drugs: Secondary | ICD-10-CM | POA: Diagnosis not present

## 2022-08-16 DIAGNOSIS — E1122 Type 2 diabetes mellitus with diabetic chronic kidney disease: Secondary | ICD-10-CM | POA: Diagnosis not present

## 2022-08-16 DIAGNOSIS — I4892 Unspecified atrial flutter: Secondary | ICD-10-CM | POA: Diagnosis not present

## 2022-08-16 DIAGNOSIS — H1089 Other conjunctivitis: Secondary | ICD-10-CM | POA: Diagnosis not present

## 2022-08-16 DIAGNOSIS — E785 Hyperlipidemia, unspecified: Secondary | ICD-10-CM | POA: Diagnosis not present

## 2022-08-16 DIAGNOSIS — Z794 Long term (current) use of insulin: Secondary | ICD-10-CM | POA: Diagnosis not present

## 2022-08-16 DIAGNOSIS — E872 Acidosis, unspecified: Secondary | ICD-10-CM | POA: Diagnosis not present

## 2022-08-16 DIAGNOSIS — I5030 Unspecified diastolic (congestive) heart failure: Secondary | ICD-10-CM | POA: Diagnosis not present

## 2022-08-16 DIAGNOSIS — I480 Paroxysmal atrial fibrillation: Secondary | ICD-10-CM | POA: Diagnosis not present

## 2022-08-16 DIAGNOSIS — I130 Hypertensive heart and chronic kidney disease with heart failure and stage 1 through stage 4 chronic kidney disease, or unspecified chronic kidney disease: Secondary | ICD-10-CM | POA: Diagnosis not present

## 2022-08-16 DIAGNOSIS — Z7984 Long term (current) use of oral hypoglycemic drugs: Secondary | ICD-10-CM | POA: Diagnosis not present

## 2022-08-16 DIAGNOSIS — E538 Deficiency of other specified B group vitamins: Secondary | ICD-10-CM | POA: Diagnosis not present

## 2022-08-16 DIAGNOSIS — N183 Chronic kidney disease, stage 3 unspecified: Secondary | ICD-10-CM | POA: Diagnosis not present

## 2022-08-16 DIAGNOSIS — C220 Liver cell carcinoma: Secondary | ICD-10-CM | POA: Diagnosis not present

## 2022-08-16 DIAGNOSIS — Z9181 History of falling: Secondary | ICD-10-CM | POA: Diagnosis not present

## 2022-08-16 DIAGNOSIS — M6282 Rhabdomyolysis: Secondary | ICD-10-CM | POA: Diagnosis not present

## 2022-08-16 DIAGNOSIS — Z7901 Long term (current) use of anticoagulants: Secondary | ICD-10-CM | POA: Diagnosis not present

## 2022-08-17 DIAGNOSIS — E872 Acidosis, unspecified: Secondary | ICD-10-CM | POA: Diagnosis not present

## 2022-08-17 DIAGNOSIS — H1089 Other conjunctivitis: Secondary | ICD-10-CM | POA: Diagnosis not present

## 2022-08-17 DIAGNOSIS — I4892 Unspecified atrial flutter: Secondary | ICD-10-CM | POA: Diagnosis not present

## 2022-08-17 DIAGNOSIS — I5030 Unspecified diastolic (congestive) heart failure: Secondary | ICD-10-CM | POA: Diagnosis not present

## 2022-08-17 DIAGNOSIS — E538 Deficiency of other specified B group vitamins: Secondary | ICD-10-CM | POA: Diagnosis not present

## 2022-08-17 DIAGNOSIS — E1122 Type 2 diabetes mellitus with diabetic chronic kidney disease: Secondary | ICD-10-CM | POA: Diagnosis not present

## 2022-08-17 DIAGNOSIS — Z7984 Long term (current) use of oral hypoglycemic drugs: Secondary | ICD-10-CM | POA: Diagnosis not present

## 2022-08-17 DIAGNOSIS — N183 Chronic kidney disease, stage 3 unspecified: Secondary | ICD-10-CM | POA: Diagnosis not present

## 2022-08-17 DIAGNOSIS — M6282 Rhabdomyolysis: Secondary | ICD-10-CM | POA: Diagnosis not present

## 2022-08-17 DIAGNOSIS — I480 Paroxysmal atrial fibrillation: Secondary | ICD-10-CM | POA: Diagnosis not present

## 2022-08-17 DIAGNOSIS — C220 Liver cell carcinoma: Secondary | ICD-10-CM | POA: Diagnosis not present

## 2022-08-17 DIAGNOSIS — I130 Hypertensive heart and chronic kidney disease with heart failure and stage 1 through stage 4 chronic kidney disease, or unspecified chronic kidney disease: Secondary | ICD-10-CM | POA: Diagnosis not present

## 2022-08-17 DIAGNOSIS — Z7901 Long term (current) use of anticoagulants: Secondary | ICD-10-CM | POA: Diagnosis not present

## 2022-08-17 DIAGNOSIS — Z9181 History of falling: Secondary | ICD-10-CM | POA: Diagnosis not present

## 2022-08-17 DIAGNOSIS — Z794 Long term (current) use of insulin: Secondary | ICD-10-CM | POA: Diagnosis not present

## 2022-08-17 DIAGNOSIS — E785 Hyperlipidemia, unspecified: Secondary | ICD-10-CM | POA: Diagnosis not present

## 2022-08-20 ENCOUNTER — Encounter: Payer: Self-pay | Admitting: Pharmacist

## 2022-08-21 ENCOUNTER — Inpatient Hospital Stay
Admission: EM | Admit: 2022-08-21 | Discharge: 2022-08-24 | DRG: 308 | Disposition: A | Discharge status: Home Health | Payer: 59 | Attending: Osteopathic Medicine | Admitting: Osteopathic Medicine

## 2022-08-21 ENCOUNTER — Encounter: Payer: Self-pay | Admitting: Internal Medicine

## 2022-08-21 ENCOUNTER — Emergency Department: Payer: 59

## 2022-08-21 ENCOUNTER — Other Ambulatory Visit: Payer: Self-pay

## 2022-08-21 DIAGNOSIS — F1010 Alcohol abuse, uncomplicated: Secondary | ICD-10-CM | POA: Diagnosis present

## 2022-08-21 DIAGNOSIS — Z79899 Other long term (current) drug therapy: Secondary | ICD-10-CM

## 2022-08-21 DIAGNOSIS — I499 Cardiac arrhythmia, unspecified: Secondary | ICD-10-CM | POA: Diagnosis not present

## 2022-08-21 DIAGNOSIS — R0602 Shortness of breath: Secondary | ICD-10-CM | POA: Diagnosis not present

## 2022-08-21 DIAGNOSIS — N179 Acute kidney failure, unspecified: Secondary | ICD-10-CM

## 2022-08-21 DIAGNOSIS — G40909 Epilepsy, unspecified, not intractable, without status epilepticus: Secondary | ICD-10-CM | POA: Diagnosis present

## 2022-08-21 DIAGNOSIS — E119 Type 2 diabetes mellitus without complications: Secondary | ICD-10-CM

## 2022-08-21 DIAGNOSIS — E1169 Type 2 diabetes mellitus with other specified complication: Secondary | ICD-10-CM | POA: Diagnosis present

## 2022-08-21 DIAGNOSIS — I5032 Chronic diastolic (congestive) heart failure: Secondary | ICD-10-CM | POA: Diagnosis not present

## 2022-08-21 DIAGNOSIS — I130 Hypertensive heart and chronic kidney disease with heart failure and stage 1 through stage 4 chronic kidney disease, or unspecified chronic kidney disease: Secondary | ICD-10-CM | POA: Diagnosis present

## 2022-08-21 DIAGNOSIS — I480 Paroxysmal atrial fibrillation: Secondary | ICD-10-CM | POA: Diagnosis not present

## 2022-08-21 DIAGNOSIS — C220 Liver cell carcinoma: Secondary | ICD-10-CM | POA: Diagnosis present

## 2022-08-21 DIAGNOSIS — E782 Mixed hyperlipidemia: Secondary | ICD-10-CM | POA: Diagnosis not present

## 2022-08-21 DIAGNOSIS — A419 Sepsis, unspecified organism: Secondary | ICD-10-CM | POA: Diagnosis present

## 2022-08-21 DIAGNOSIS — G9341 Metabolic encephalopathy: Secondary | ICD-10-CM | POA: Diagnosis not present

## 2022-08-21 DIAGNOSIS — F17290 Nicotine dependence, other tobacco product, uncomplicated: Secondary | ICD-10-CM | POA: Diagnosis present

## 2022-08-21 DIAGNOSIS — Z7901 Long term (current) use of anticoagulants: Secondary | ICD-10-CM

## 2022-08-21 DIAGNOSIS — Z1152 Encounter for screening for COVID-19: Secondary | ICD-10-CM | POA: Diagnosis not present

## 2022-08-21 DIAGNOSIS — I4891 Unspecified atrial fibrillation: Secondary | ICD-10-CM | POA: Diagnosis not present

## 2022-08-21 DIAGNOSIS — E11649 Type 2 diabetes mellitus with hypoglycemia without coma: Secondary | ICD-10-CM | POA: Diagnosis not present

## 2022-08-21 DIAGNOSIS — Z794 Long term (current) use of insulin: Secondary | ICD-10-CM | POA: Diagnosis not present

## 2022-08-21 DIAGNOSIS — Z7984 Long term (current) use of oral hypoglycemic drugs: Secondary | ICD-10-CM

## 2022-08-21 DIAGNOSIS — K7460 Unspecified cirrhosis of liver: Secondary | ICD-10-CM | POA: Diagnosis present

## 2022-08-21 DIAGNOSIS — Z66 Do not resuscitate: Secondary | ICD-10-CM | POA: Diagnosis not present

## 2022-08-21 DIAGNOSIS — K7030 Alcoholic cirrhosis of liver without ascites: Secondary | ICD-10-CM | POA: Diagnosis present

## 2022-08-21 DIAGNOSIS — N1831 Chronic kidney disease, stage 3a: Secondary | ICD-10-CM | POA: Diagnosis present

## 2022-08-21 DIAGNOSIS — Z8679 Personal history of other diseases of the circulatory system: Secondary | ICD-10-CM

## 2022-08-21 DIAGNOSIS — R7989 Other specified abnormal findings of blood chemistry: Secondary | ICD-10-CM | POA: Diagnosis not present

## 2022-08-21 DIAGNOSIS — Z8249 Family history of ischemic heart disease and other diseases of the circulatory system: Secondary | ICD-10-CM

## 2022-08-21 DIAGNOSIS — Z8711 Personal history of peptic ulcer disease: Secondary | ICD-10-CM

## 2022-08-21 DIAGNOSIS — R6511 Systemic inflammatory response syndrome (SIRS) of non-infectious origin with acute organ dysfunction: Secondary | ICD-10-CM | POA: Diagnosis not present

## 2022-08-21 DIAGNOSIS — E1122 Type 2 diabetes mellitus with diabetic chronic kidney disease: Secondary | ICD-10-CM | POA: Diagnosis not present

## 2022-08-21 DIAGNOSIS — R1111 Vomiting without nausea: Secondary | ICD-10-CM | POA: Diagnosis not present

## 2022-08-21 DIAGNOSIS — I2489 Other forms of acute ischemic heart disease: Secondary | ICD-10-CM | POA: Diagnosis not present

## 2022-08-21 DIAGNOSIS — R4182 Altered mental status, unspecified: Secondary | ICD-10-CM

## 2022-08-21 DIAGNOSIS — Z743 Need for continuous supervision: Secondary | ICD-10-CM | POA: Diagnosis not present

## 2022-08-21 DIAGNOSIS — D696 Thrombocytopenia, unspecified: Secondary | ICD-10-CM | POA: Insufficient documentation

## 2022-08-21 DIAGNOSIS — Z886 Allergy status to analgesic agent status: Secondary | ICD-10-CM

## 2022-08-21 DIAGNOSIS — F10188 Alcohol abuse with other alcohol-induced disorder: Secondary | ICD-10-CM | POA: Diagnosis present

## 2022-08-21 DIAGNOSIS — I10 Essential (primary) hypertension: Secondary | ICD-10-CM | POA: Diagnosis present

## 2022-08-21 DIAGNOSIS — R6520 Severe sepsis without septic shock: Secondary | ICD-10-CM | POA: Diagnosis present

## 2022-08-21 DIAGNOSIS — E1159 Type 2 diabetes mellitus with other circulatory complications: Secondary | ICD-10-CM | POA: Diagnosis present

## 2022-08-21 LAB — CBC WITH DIFFERENTIAL/PLATELET
Abs Immature Granulocytes: 0 10*3/uL (ref 0.00–0.07)
Basophils Absolute: 0 10*3/uL (ref 0.0–0.1)
Basophils Relative: 1 %
Eosinophils Absolute: 0 10*3/uL (ref 0.0–0.5)
Eosinophils Relative: 0 %
HCT: 41.9 % (ref 36.0–46.0)
Hemoglobin: 13.1 g/dL (ref 12.0–15.0)
Immature Granulocytes: 0 %
Lymphocytes Relative: 43 %
Lymphs Abs: 0.9 10*3/uL (ref 0.7–4.0)
MCH: 31.7 pg (ref 26.0–34.0)
MCHC: 31.3 g/dL (ref 30.0–36.0)
MCV: 101.5 fL — ABNORMAL HIGH (ref 80.0–100.0)
Monocytes Absolute: 0.3 10*3/uL (ref 0.1–1.0)
Monocytes Relative: 13 %
Neutro Abs: 0.9 10*3/uL — ABNORMAL LOW (ref 1.7–7.7)
Neutrophils Relative %: 43 %
Platelets: 76 10*3/uL — ABNORMAL LOW (ref 150–400)
RBC: 4.13 MIL/uL (ref 3.87–5.11)
RDW: 14.6 % (ref 11.5–15.5)
Smear Review: NORMAL
WBC: 2 10*3/uL — ABNORMAL LOW (ref 4.0–10.5)
nRBC: 0 % (ref 0.0–0.2)

## 2022-08-21 LAB — PROCALCITONIN: Procalcitonin: <0.1 ng/mL

## 2022-08-21 LAB — URINALYSIS, W/ REFLEX TO CULTURE (INFECTION SUSPECTED)
Bilirubin Urine: NEGATIVE
Glucose, UA: NEGATIVE mg/dL
Ketones, ur: NEGATIVE mg/dL
Leukocytes,Ua: NEGATIVE
Nitrite: NEGATIVE
Protein, ur: 30 mg/dL — AB
Specific Gravity, Urine: 1.014 (ref 1.005–1.030)
pH: 5 (ref 5.0–8.0)

## 2022-08-21 LAB — LACTIC ACID, PLASMA
Lactic Acid, Venous: 3.1 mmol/L (ref 0.5–1.9)
Lactic Acid, Venous: 4.7 mmol/L (ref 0.5–1.9)
Lactic Acid, Venous: 3.8 mmol/L (ref 0.5–1.9)

## 2022-08-21 LAB — RESPIRATORY PANEL BY PCR

## 2022-08-21 LAB — COMPREHENSIVE METABOLIC PANEL
ALT: 38 U/L (ref 0–44)
AST: 102 U/L — ABNORMAL HIGH (ref 15–41)
Albumin: 3.1 g/dL — ABNORMAL LOW (ref 3.5–5.0)
Alkaline Phosphatase: 80 U/L (ref 38–126)
Anion gap: 11 (ref 5–15)
BUN: 17 mg/dL (ref 8–23)
CO2: 23 mmol/L (ref 22–32)
Calcium: 8 mg/dL — ABNORMAL LOW (ref 8.9–10.3)
Chloride: 103 mmol/L (ref 98–111)
Creatinine, Ser: 1.3 mg/dL — ABNORMAL HIGH (ref 0.44–1.00)
GFR, Estimated: 45 mL/min — ABNORMAL LOW (ref >60)
Glucose, Bld: 102 mg/dL — ABNORMAL HIGH (ref 70–99)
Potassium: 3.7 mmol/L (ref 3.5–5.1)
Sodium: 137 mmol/L (ref 135–145)
Total Bilirubin: 1.2 mg/dL (ref 0.3–1.2)
Total Protein: 7.9 g/dL (ref 6.5–8.1)

## 2022-08-21 LAB — SARS CORONAVIRUS 2 BY RT PCR: SARS Coronavirus 2 by RT PCR: NEGATIVE

## 2022-08-21 LAB — AMMONIA: Ammonia: 19 umol/L (ref 9–35)

## 2022-08-21 LAB — CBG MONITORING, ED
Glucose-Capillary: 148 mg/dL — ABNORMAL HIGH (ref 70–99)
Glucose-Capillary: 129 mg/dL — ABNORMAL HIGH (ref 70–99)

## 2022-08-21 LAB — TROPONIN I (HIGH SENSITIVITY)
Troponin I (High Sensitivity): 16 ng/L (ref <18)
Troponin I (High Sensitivity): 18 ng/L — ABNORMAL HIGH (ref <18)

## 2022-08-21 MED ORDER — SODIUM CHLORIDE 0.9 % IV SOLN
2.0000 g | Freq: Two times a day (BID) | INTRAVENOUS | Status: DC
  Administered 2022-08-21 – 2022-08-23 (×4): 2 g via INTRAVENOUS
  Filled 2022-08-21 (×5): qty 12.5

## 2022-08-21 MED ORDER — APIXABAN 5 MG PO TABS
5.0000 mg | ORAL_TABLET | Freq: Two times a day (BID) | ORAL | Status: DC
  Administered 2022-08-21 – 2022-08-24 (×6): 5 mg via ORAL
  Filled 2022-08-21 (×6): qty 1

## 2022-08-21 MED ORDER — ALLOPURINOL 100 MG PO TABS
100.0000 mg | ORAL_TABLET | Freq: Every day | ORAL | Status: DC
  Administered 2022-08-22 – 2022-08-24 (×3): 100 mg via ORAL
  Filled 2022-08-21 (×3): qty 1

## 2022-08-21 MED ORDER — SODIUM CHLORIDE 0.9 % IV BOLUS
500.0000 mL | Freq: Once | INTRAVENOUS | Status: AC
  Administered 2022-08-21: 500 mL via INTRAVENOUS

## 2022-08-21 MED ORDER — SODIUM CHLORIDE 0.9 % IV SOLN: INTRAVENOUS | Status: DC

## 2022-08-21 MED ORDER — TRAZODONE HCL 50 MG PO TABS
50.0000 mg | ORAL_TABLET | Freq: Every day | ORAL | Status: DC
  Administered 2022-08-21 – 2022-08-23 (×3): 50 mg via ORAL
  Filled 2022-08-21 (×3): qty 1

## 2022-08-21 MED ORDER — ONDANSETRON HCL 4 MG/2ML IJ SOLN: 4.0000 mg | Freq: Four times a day (QID) | INTRAMUSCULAR | Status: DC | PRN

## 2022-08-21 MED ORDER — SODIUM CHLORIDE 0.9 % IV BOLUS
1000.0000 mL | Freq: Once | INTRAVENOUS | Status: AC
  Administered 2022-08-21: 1000 mL via INTRAVENOUS

## 2022-08-21 MED ORDER — LORAZEPAM 2 MG/ML IJ SOLN: 2.0000 mg | INTRAMUSCULAR | Status: DC | PRN

## 2022-08-21 MED ORDER — ACETAMINOPHEN 650 MG RE SUPP: 650.0000 mg | Freq: Four times a day (QID) | RECTAL | Status: DC | PRN

## 2022-08-21 MED ORDER — LACTATED RINGERS IV BOLUS
1000.0000 mL | Freq: Once | INTRAVENOUS | Status: AC
  Administered 2022-08-21: 1000 mL via INTRAVENOUS

## 2022-08-21 MED ORDER — ACETAMINOPHEN 325 MG PO TABS: 650.0000 mg | ORAL_TABLET | Freq: Four times a day (QID) | ORAL | Status: DC | PRN

## 2022-08-21 MED ORDER — SODIUM CHLORIDE 0.9 % IV SOLN: 12.5000 mg | Freq: Four times a day (QID) | INTRAVENOUS | Status: AC | PRN

## 2022-08-21 MED ORDER — ONDANSETRON HCL 4 MG PO TABS: 4.0000 mg | ORAL_TABLET | Freq: Four times a day (QID) | ORAL | Status: DC | PRN

## 2022-08-21 MED ORDER — ENOXAPARIN SODIUM 40 MG/0.4ML IJ SOSY: 40.0000 mg | PREFILLED_SYRINGE | INTRAMUSCULAR | Status: DC

## 2022-08-21 MED ORDER — VANCOMYCIN HCL 2000 MG/400ML IV SOLN
2000.0000 mg | Freq: Once | INTRAVENOUS | Status: AC
  Administered 2022-08-21: 2000 mg via INTRAVENOUS
  Filled 2022-08-21: qty 400

## 2022-08-21 MED ORDER — FOLIC ACID 1 MG PO TABS
1.0000 mg | ORAL_TABLET | Freq: Every day | ORAL | Status: DC
  Administered 2022-08-21 – 2022-08-24 (×4): 1 mg via ORAL
  Filled 2022-08-21 (×4): qty 1

## 2022-08-21 MED ORDER — DILTIAZEM HCL-DEXTROSE 125-5 MG/125ML-% IV SOLN (PREMIX)
5.0000 mg/h | INTRAVENOUS | Status: DC
  Administered 2022-08-21: 10 mg/h via INTRAVENOUS
  Administered 2022-08-21: 5 mg/h via INTRAVENOUS
  Administered 2022-08-21: 15 mg/h via INTRAVENOUS
  Administered 2022-08-21: 12.5 mg/h via INTRAVENOUS
  Filled 2022-08-21: qty 125

## 2022-08-21 MED ORDER — INSULIN GLARGINE-YFGN 100 UNIT/ML ~~LOC~~ SOLN
28.0000 [IU] | Freq: Every day | SUBCUTANEOUS | Status: DC
  Administered 2022-08-21: 28 [IU] via SUBCUTANEOUS
  Filled 2022-08-21 (×3): qty 0.28

## 2022-08-21 MED ORDER — THIAMINE MONONITRATE 100 MG PO TABS
100.0000 mg | ORAL_TABLET | Freq: Every day | ORAL | Status: DC
  Administered 2022-08-21 – 2022-08-24 (×4): 100 mg via ORAL
  Filled 2022-08-21 (×4): qty 1

## 2022-08-21 MED ORDER — INSULIN ASPART 100 UNIT/ML IJ SOLN: 0.0000 [IU] | Freq: Every day | INTRAMUSCULAR | Status: DC

## 2022-08-21 MED ORDER — INSULIN ASPART 100 UNIT/ML IJ SOLN
0.0000 [IU] | Freq: Three times a day (TID) | INTRAMUSCULAR | Status: DC
  Administered 2022-08-21 – 2022-08-22 (×2): 1 [IU] via SUBCUTANEOUS
  Filled 2022-08-21: qty 1

## 2022-08-21 MED ORDER — ESCITALOPRAM OXALATE 10 MG PO TABS
10.0000 mg | ORAL_TABLET | Freq: Every day | ORAL | Status: DC
  Administered 2022-08-21 – 2022-08-23 (×3): 10 mg via ORAL
  Filled 2022-08-21 (×3): qty 1

## 2022-08-21 MED ORDER — METOPROLOL TARTRATE 25 MG PO TABS
12.5000 mg | ORAL_TABLET | Freq: Two times a day (BID) | ORAL | Status: DC
  Administered 2022-08-21 – 2022-08-22 (×2): 12.5 mg via ORAL
  Filled 2022-08-21 (×2): qty 1

## 2022-08-21 MED ORDER — SENNOSIDES-DOCUSATE SODIUM 8.6-50 MG PO TABS: 1.0000 | ORAL_TABLET | Freq: Every evening | ORAL | Status: DC | PRN

## 2022-08-21 MED ORDER — ONDANSETRON HCL 4 MG/2ML IJ SOLN
4.0000 mg | Freq: Once | INTRAMUSCULAR | Status: AC
  Administered 2022-08-21: 4 mg via INTRAVENOUS
  Filled 2022-08-21: qty 2

## 2022-08-21 MED ORDER — VANCOMYCIN VARIABLE DOSE PER UNSTABLE RENAL FUNCTION (PHARMACIST DOSING): Status: DC

## 2022-08-21 MED ORDER — INSULIN GLARGINE (1 UNIT DIAL) 300 UNIT/ML ~~LOC~~ SOPN: 28.0000 [IU] | PEN_INJECTOR | Freq: Every day | SUBCUTANEOUS | Status: DC

## 2022-08-21 MED ORDER — MIRTAZAPINE 15 MG PO TABS
7.5000 mg | ORAL_TABLET | Freq: Every day | ORAL | Status: DC
  Administered 2022-08-21 – 2022-08-23 (×3): 7.5 mg via ORAL
  Filled 2022-08-21 (×3): qty 1

## 2022-08-21 NOTE — ED Notes (Signed)
Family updated as to patient's status.

## 2022-08-21 NOTE — ED Triage Notes (Signed)
Pt unable to get up this morning and started vomiting and shaking. Brother who lives with pt called EMS. EMS found pt still vomiting and in A fib RVR.

## 2022-08-21 NOTE — Assessment & Plan Note (Signed)
Counseling given for alcohol cessation given extensively at bedside Patient states "I am quitting today girlfriend "

## 2022-08-21 NOTE — Assessment & Plan Note (Signed)
AST elevated, compared to baseline elevation, treat per SIRS criteria and continue monitoring Hepatic panel in the a.m.

## 2022-08-21 NOTE — Assessment & Plan Note (Signed)
Multifactorial, in setting of prerenal in setting of GI loss and possible complication from SIRS LR 1 L bolus ordered on admission Avoid nephrotoxic agents Repeat BMP in the a.m.

## 2022-08-21 NOTE — Hospital Course (Addendum)
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  Vitals in the ED showed temperature of 98.6, respiration rate of 14, heart rate of 128, blood pressure 133/85, SpO2 98% on room air.  Serum sodium is 137, potassium 2.7, chloride 103, bicarb 23, BUN of 17, serum creatinine 1.30, eGFR 45, nonfasting blood glucose 102, WBC 2.0, hemoglobin 13.1, platelets of 76.  AST elevated at 102.  High sensitive troponin is 16.  ED treatment: Ondansetron 4 mg IV one-time dose, diltiazem gtt., sodium chloride 500 mL bolus

## 2022-08-21 NOTE — Progress Notes (Signed)
Pharmacy Antibiotic Note  Adriana Miller is a 67 y.o. female admitted on 08/21/2022 with sepsis.  Pharmacy has been consulted for vancomycin and cefepime dosing.  Scr 1.30 - elevated from baseline (baseline scr 0.99)  Plan: Vancomycin IV 2,000 mg x 1 loading dose. Given elevated renal function, will defer loading dose and recheck renal function in the AM. If scr continues to rise, consider dosing vancomycin  by levels  Cefepime IV 2 grams every 12 hours  Height: 5\' 6"  (167.6 cm) Weight: 80 kg (176 lb 5.9 oz) IBW/kg (Calculated) : 59.3  Temp (24hrs), Avg:98.6 F (37 C), Min:98.6 F (37 C), Max:98.6 F (37 C)  Recent Labs  Lab 08/21/22 1132 08/21/22 1419  WBC 2.0*  --   CREATININE 1.30*  --   LATICACIDVEN  --  3.1*    Estimated Creatinine Clearance: 44.2 mL/min (A) (by C-G formula based on SCr of 1.3 mg/dL (H)).    Allergies  Allergen Reactions   Aspirin Other (See Comments)    Nose bleed    Antimicrobials this admission: vancomycin 5/4 >>  cefepime 5/4 >>   Dose adjustments this admission: N/a  Microbiology results: 5/4 BCx: in process   Thank you for allowing pharmacy to be a part of this patient's care.  Elliot Gurney, PharmD, BCPS Clinical Pharmacist  08/21/2022 3:37 PM

## 2022-08-21 NOTE — Assessment & Plan Note (Addendum)
No identified source of infection at this time, patient  had elevated HR and leukopenia, with acute kidney injury and atrial fibrillation with RVR Severe sepsis cannot be excluded at this time Check lactic x2, procalcitonin, blood cultures x2, respiratory panel, covid pcr, ua LR 1 L bolus ordered on admission, avoid nephrotoxic agents Admit to PCU, inpatient

## 2022-08-21 NOTE — Assessment & Plan Note (Signed)
Appears compensated, not in acute exacerbation at this time, strict I's and O's

## 2022-08-21 NOTE — ED Provider Notes (Addendum)
[facility] Provider Note   Event Date/Time   First MD Initiated Contact with Patient 08/21/22 1119     (approximate) History  No chief complaint on file.  HPI Adriana Miller is a 67 y.o. female with a past medical history of cirrhosis, heart failure, and paroxysmal atrial fibrillation who presents via EMS after they were called to her house for generalized weakness and episode of vomiting.  Patient is also confused and is unable to provide complete history.  Unable to convey when her last dose of lactulose was. ROS: Unable to fully assess due to mental status   Physical Exam  Triage Vital Signs: ED Triage Vitals  Enc Vitals Group     BP      Pulse      Resp      Temp      Temp src      SpO2      Weight      Height      Head Circumference      Peak Flow      Pain Score      Pain Loc      Pain Edu?      Excl. in GC?    Most recent vital signs: There were no vitals filed for this visit. General: Awake, cooperative CV:  Good peripheral perfusion.  Resp:  Normal effort.  Abd:  No distention.  Other:  Elderly overweight African-American female laying in bed in no acute distress.  Pleasant but yelling at staff ED Results / Procedures / Treatments  Labs (all labs ordered are listed, but only abnormal results are displayed) Labs Reviewed - No data to display EKG ED ECG REPORT I, Merwyn Katos, the attending physician, personally viewed and interpreted this ECG. Date: 08/21/2022 EKG Time: 1151 Rate: 123 Rhythm: Atrial fibrillation with rapid ventricular response QRS Axis: normal Intervals: normal ST/T Wave abnormalities: normal Narrative Interpretation: Atrial fibrillation with rapid ventricular response.  No evidence of acute ischemia RADIOLOGY ED MD interpretation: 2 view chest x-ray interpreted by me shows no evidence of acute abnormalities including no pneumonia, pneumothorax, or widened mediastinum -Agree with radiology  assessment Official radiology report(s): No results found. PROCEDURES: Critical Care performed: Yes, see critical care procedure note(s) Procedures MEDICATIONS ORDERED IN ED: Medications - No data to display IMPRESSION / MDM / ASSESSMENT AND PLAN / ED COURSE  I reviewed the triage vital signs and the nursing notes.                             The patient is on the cardiac monitor to evaluate for evidence of arrhythmia and/or significant heart rate changes. Patient's presentation is most consistent with acute presentation with potential threat to life or bodily function. + atrial fibrillation w/ RVR DDx: Pneumothorax, Pneumonia, Pulmonary Embolus, Tamponade, ACS, Thyrotoxicosis.  No history or evidence decompensated heart failure. Given their history and exam it is likely this patient is unlikely to spontaneously revert to a rate controlled rhythm and necessitates a thorough workup for their arrhythmia. Workup: ECG, CXR, CBC, BMP, UA, Troponin, BNP, TSH, Ca-Mag-Phos Interventions: Defer Cardioversion (uncertain historical reliability with time of onset, increased risk of thromboembolic stroke).  Start diltiazem bolus and drip  {Reassessment: Patient maintained NSR during multi-hour observation in ED. Disposition: Discharge home with prompt PCP follow up and cardiology referral.}   Disposition: Admit   FINAL CLINICAL IMPRESSION(S) / ED DIAGNOSES   Final  diagnoses:  None   Rx / DC Orders   ED Discharge Orders     None      Note:  This document was prepared using Dragon voice recognition software and may include unintentional dictation errors.

## 2022-08-21 NOTE — Assessment & Plan Note (Signed)
Etiology workup in progress, patient is meeting SIRS criteria with leukocytopenia No source of infection identified at this time Procalcitonin, lactic acid, blood cultures x 2, respiratory panel, COVID PCR ordered Continue diltiazem gtt. Admit to PCU

## 2022-08-21 NOTE — Assessment & Plan Note (Signed)
Patient was able to tell me her name, age, location, current calendar year however patient not a good historian not able to tell me why she is in the hospital Intermittently she states she feels better with fluid and can dance, and telling me that she was vomiting a lot and feeling terrible

## 2022-08-21 NOTE — Assessment & Plan Note (Signed)
Ativan 2 mg IV as needed for seizure, withdrawal symptoms with nursing instruction: Please administer as appropriate and then let provider know.  Patient has history of alcoholic liver cirrhosis, last drink was 08/20/2022.  If indicated provider will initiate CIWA precaution.

## 2022-08-21 NOTE — H&P (Addendum)
History and Physical   Adriana Miller UEA:[id number] DOB: [birth year] DOA: 08/21/2022  PCP: Carlean Jews, PA-C  Outpatient Specialists: Dr. Okey Dupre Patient coming from: Home via EMS  I have personally briefly reviewed patient's old medical records in [facility] EMR.  Chief Concern: Intractable nausea and vomiting  HPI: 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  Vitals in the ED showed temperature of 98.6, respiration rate of 14, heart rate of 128, blood pressure 133/85, SpO2 98% on room air.  Serum sodium is 137, potassium 2.7, chloride 103, bicarb 23, BUN of 17, serum creatinine 1.30, eGFR 45, nonfasting blood glucose 102, WBC 2.0, hemoglobin 13.1, platelets of 76.  AST elevated at 102.  High sensitive troponin is 16.  ED treatment: Ondansetron 4 mg IV one-time dose, diltiazem gtt., sodium chloride 500 mL bolus ----------------------------- At bedside, patient was able to tell me her name, age, location, current calendar year.  Patient exhibits intermittent odd behavior, query developing dementia versus transient confusion.  She reports currently she is feeling much better.  She reports that last night and this morning she was feeling "terrible ".  She had a lot of vomiting, mostly of her food.  She denies blood or coffee-ground emesis.  She reports over the last 2 days she has been having shortness of breath that is productive.  She does not know what color is the productiveness.  She endorses chills and denies any known fever.  She denies chest pain, chest pressure, abdominal pain, dysuria, hematuria,  diarrhea, blood in her stool.  She reports that she does have baseline coughing over the last 6 months however it has been worsened over the last few days.  She denies known sick contacts.  She does not know what brought on the vomiting.  She also reports that she tried taking her a.m. medications this morning and vomited up all of her pills.  Social history: Patient lives at home with her brother.  She endorses dipping tobacco.  She endorses current EtOH use, drinking 2 cans of beer per day.  She denies recreational drug use.  ROS: Constitutional: no weight change, no fever ENT/Mouth: no sore throat, no rhinorrhea Eyes: no eye pain, no vision changes Cardiovascular: no chest pain, + dyspnea,  no edema, no palpitations Respiratory: + cough, + sputum, no wheezing Gastrointestinal: + nausea, + vomiting, no diarrhea, no constipation Genitourinary: no urinary incontinence, no dysuria, no hematuria Musculoskeletal: no arthralgias, no myalgias Skin: no skin lesions, no pruritus, Neuro: + weakness, no loss of consciousness, no syncope Psych: no anxiety, no depression, no decrease appetite Heme/Lymph: no bruising, no bleeding  ED Course: Discussed with emergency medicine provider, patient requiring hospitalization for chief concerns of atrial fibrillation with RVR.  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)   Metabolic encephalopathy   Alcoholic cirrhosis of liver without ascites (HCC)   Type 2 diabetes mellitus (HCC)   Chronic heart failure with preserved ejection fraction (HFpEF) (HCC)   Hepatocellular carcinoma (HCC)   Mixed hyperlipidemia  Essential hypertension   Hepatic cirrhosis (HCC)   Paroxysmal atrial fibrillation (HCC)   Elevated LFTs   Thrombocytopenia (HCC)    History of cerebral aneurysm repair   Elevated troponin   Assessment and Plan:  * Atrial fibrillation with RVR (HCC) Etiology workup in progress, patient is meeting SIRS criteria with leukocytopenia No source of infection identified at this time Procalcitonin, lactic acid, blood cultures x 2, respiratory panel, COVID PCR ordered Continue diltiazem gtt. Admit to PCU  SIRS due to infectious process with acute organ dysfunction (HCC) No identified source of infection at this time, patient  had elevated HR and leukopenia, with acute kidney injury and atrial fibrillation with RVR Severe sepsis cannot be excluded at this time Check lactic x2, procalcitonin, blood cultures x2, respiratory panel, covid pcr, ua LR 1 L bolus ordered on admission, avoid nephrotoxic agents Admit to PCU, inpatient  AKI (acute kidney injury) (HCC) Multifactorial, in setting of prerenal in setting of GI loss and possible complication from SIRS LR 1 L bolus ordered on admission Avoid nephrotoxic agents Repeat BMP in the a.m.  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  Patient has history of alcoholic liver cirrhosis, last drink was 08/20/2022.  If indicated provider will initiate CIWA precaution.  Alcoholic cirrhosis of liver without ascites (HCC) Counseling given for alcohol cessation given extensively at bedside Patient states "I am quitting today girlfriend "  Chronic heart failure with preserved ejection fraction (HFpEF) (HCC) Appears  compensated, not in acute exacerbation at this time, strict I's and O's  Type 2 diabetes mellitus (HCC) Insulin SSI with at bedtime coverage Blood glucose level target is 140-180 while inpatient  Elevated troponin Suspect demand ischemia secondary to severe sepsis No clinical suspicion for acs at this time Continue close monitoring in ICU  Thrombocytopenia (HCC) Chronic over last 2 months, presumed secondary to liver cirrhosis in the  Elevated LFTs AST elevated, compared to baseline elevation, treat per SIRS criteria and continue monitoring Hepatic panel in the a.m.  Fall precaution, aspiration precaution  Addendum: Elevated lactic acid, acknowledge from nursing.  We will continue sepsis workup.  Discussed with nursing Ordered additional sodium chloride 1 L bolus, to complete 2.5 L sepsis bolus on admission (ED ordered NS 500 ml, on admission we ordered LR 1 L bolus followed by NS 1 L = 2.5 L bolus) After bolus, sodium chloride 125 mL/h infusion to be initiated Vancomycin and cefepime per pharmacy  Chart reviewed.   DVT prophylaxis: Eliquis 5 mg p.o. twice daily Code Status: DNR Diet: Heart healthy/carb modified Family Communication: No, she states that her brother already knows because he sent her to the hospital Disposition Plan: Pending clinical course Consults called: None at this time Admission status: PCU, inpatient  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)    B12 deficiency 04/05/2017   Blood in stool    Cerebral aneurysm    Chronic kidney disease    Diabetes mellitus without complication (HCC)    type 2  Gastric erosion with bleeding    Hepatocellular carcinoma (HCC)    Hyperlipidemia    Hypertension    Lower extremity cellulitis 02/08/2019   Paroxysmal A-fib (HCC)    Seizure disorder [facility])    Past Surgical History:  Procedure Laterality Date   CEREBRAL ANEURYSM REPAIR  1991   COLONOSCOPY WITH PROPOFOL  N/A 02/12/2019   Procedure: COLONOSCOPY WITH PROPOFOL;  Surgeon: Toney Reil, MD;  Location: [facility] ENDOSCOPY;  Service: Gastroenterology;  Laterality: N/A;   ESOPHAGOGASTRODUODENOSCOPY (EGD) WITH PROPOFOL N/A 02/12/2019   Procedure: ESOPHAGOGASTRODUODENOSCOPY (EGD) WITH PROPOFOL;  Surgeon: Toney Reil, MD;  Location: [facility] ENDOSCOPY;  Service: Gastroenterology;  Laterality: N/A;   IR ANGIOGRAM SELECTIVE EACH ADDITIONAL VESSEL  09/21/2019   IR ANGIOGRAM SELECTIVE EACH ADDITIONAL VESSEL  09/21/2019   IR ANGIOGRAM SELECTIVE EACH ADDITIONAL VESSEL  09/21/2019   IR ANGIOGRAM SELECTIVE EACH ADDITIONAL VESSEL  09/21/2019   IR ANGIOGRAM SELECTIVE EACH ADDITIONAL VESSEL  10/02/2019   IR ANGIOGRAM VISCERAL SELECTIVE  09/21/2019   IR ANGIOGRAM VISCERAL SELECTIVE  09/21/2019   IR ANGIOGRAM VISCERAL SELECTIVE  10/02/2019   IR EMBO TUMOR ORGAN ISCHEMIA INFARCT INC GUIDE ROADMAPPING  10/02/2019   IR EMBO TUMOR ORGAN ISCHEMIA INFARCT INC GUIDE ROADMAPPING  10/02/2019   IR RADIOLOGIST EVAL & MGMT  08/28/2019   IR RADIOLOGIST EVAL & MGMT  01/30/2020   IR RADIOLOGIST EVAL & MGMT  04/30/2020   IR RADIOLOGIST EVAL & MGMT  07/30/2020   IR RADIOLOGIST EVAL & MGMT  10/28/2020   IR RADIOLOGIST EVAL & MGMT  02/24/2021   IR RADIOLOGIST EVAL & MGMT  06/08/2021   IR RADIOLOGIST EVAL & MGMT  11/24/2021   IR RADIOLOGIST EVAL & MGMT  05/25/2022   IR US GUIDE VASC ACCESS LEFT  09/21/2019   IR US GUIDE VASC ACCESS LEFT  10/02/2019   OPEN REDUCTION INTERNAL FIXATION (ORIF) DISTAL RADIAL FRACTURE Right 08/06/2021   Procedure: OPEN REDUCTION INTERNAL FIXATION (ORIF) DISTAL RADIAL FRACTURE;  Surgeon: Kennedy Bucker, MD;  Location: [facility] ORS;  Service: Orthopedics;  Laterality: Right;   Social History:  reports that she has never smoked. Her smokeless tobacco use includes snuff. She reports current alcohol use of about 14.0 standard drinks of alcohol per week. She reports that she does not use drugs.  Allergies  Allergen Reactions   Aspirin  Other (See Comments)    Nose bleed   Family History  Problem Relation Age of Onset   Heart attack Brother 49   Family history: Family history reviewed and not pertinent.  Prior to Admission medications   Medication Sig Start Date End Date Taking? Authorizing Provider  acetaminophen (TYLENOL) 325 MG tablet Take 2 tablets (650 mg total) by mouth every 6 (six) hours as needed for mild pain (or Fever >/= 101). 06/19/22   Loyce Dys, MD  Alcohol Swabs (B-D SINGLE USE SWABS REGULAR) PADS USE AS DIRECTED TWICE DAILY 07/26/22   McDonough, Salomon Fick, PA-C  allopurinol (ZYLOPRIM) 100 MG tablet TAKE 1 TABLET BY MOUTH DAILY 05/17/22   McDonough, Salomon Fick, PA-C  apixaban (ELIQUIS) 5 MG TABS tablet Take 1 tablet (5 mg total) by mouth 2 (two) times daily. 07/26/22   End, Cristal Deer, MD  Blood Glucose Monitoring Suppl (ACCU-CHEK GUIDE ME) w/Device KIT Use as directed  dx E11.65 07/30/22   McDonough, Salomon Fick, PA-C  Calcium Carb-Cholecalciferol (CALCIUM 500/D) 500-5 MG-MCG TABS Take 1 tab po daily 12/07/21   McDonough, Salomon Fick, PA-C  Continuous Blood Gluc Receiver (DEXCOM G7  RECEIVER) DEVI Use for continuous blood glucose monitoring  DX E11.65 11/09/21   McDonough, Lauren K, PA-C  Continuous Blood Gluc Sensor (DEXCOM G7 SENSOR) MISC Use as directed for continuous glucose monitoring.  Change sensor every 10 days   E11.65 07/26/22   McDonough, Salomon Fick, PA-C  ergocalciferol (VITAMIN D2) 1.25 MG (50000 UT) capsule Take 1 capsule (50,000 Units total) by mouth once a week. 05/13/22   McDonough, Salomon Fick, PA-C  erythromycin ophthalmic ointment Place into both eyes every 6 (six) hours. 06/19/22   Loyce Dys, MD  escitalopram (LEXAPRO) 10 MG tablet TAKE 1 TABLET BY MOUTH DAILY FOR ANXIETY/SLEEP 07/26/22   McDonough, Salomon Fick, PA-C  folic acid (FOLVITE) 1 MG tablet Take 1 tablet (1 mg total) by mouth daily. 06/20/22   Loyce Dys, MD  furosemide (LASIX) 20 MG tablet TAKE 1 TABLET (20 MG TOTAL) BY MOUTH DAILY. 07/28/22   End,  Cristal Deer, MD  glimepiride (AMARYL) 1 MG tablet TAKE 1 TABLET BY MOUTH EVERY DAY WITH BREAKFAST 07/22/22   McDonough, Salomon Fick, PA-C  glucose blood (ACCU-CHEK GUIDE) test strip Use as instructed twice a day DX E11.65 08/03/22   McDonough, Lauren K, PA-C  insulin glargine, 1 Unit Dial, (TOUJEO SOLOSTAR) 300 UNIT/ML Solostar Pen Inject 28 Units into the skin daily with supper. 07/23/22   McDonough, Salomon Fick, PA-C  Insulin Pen Needle (BD PEN NEEDLE NANO 2ND GEN) 32G X 4 MM MISC USE AS DIRECTED WITH TOUJEO 07/26/22   McDonough, Lauren K, PA-C  metoprolol tartrate (LOPRESSOR) 25 MG tablet TAKE 1/2 TABLET BY MOUTH TWICE A DAY 07/22/22   McDonough, Lauren K, PA-C  mirtazapine (REMERON) 7.5 MG tablet TAKE 1 TABLET BY MOUTH DAILY AT  BEDTIME 07/26/22   McDonough, Lauren K, PA-C  potassium chloride (KLOR-CON) 8 MEQ tablet Take 1 tablet (8 mEq total) by mouth 3 (three) times a week. Monday, Wednesday, Friday 07/28/22   End, Cristal Deer, MD  Safety Lancets 28G MISC Use as directed once a daily Dx e11.65 07/30/22   McDonough, Salomon Fick, PA-C  spironolactone (ALDACTONE) 25 MG tablet TAKE 1 TABLET BY MOUTH DAILY FOR CIRRHOSIS OF LIVER 08/09/22   McDonough, Salomon Fick, PA-C  thiamine (VITAMIN B-1) 100 MG tablet Take 1 tablet (100 mg total) by mouth daily. 06/20/22   Loyce Dys, MD  traZODone (DESYREL) 50 MG tablet TAKE 1 TO 2 TABLETS BY MOUTH AT  BEDTIME FOR INSOMNIA Patient taking differently: Take 50-100 mg by mouth at bedtime. 05/24/22   Carlean Jews, PA-C   Physical Exam: Vitals:   08/21/22 1615 08/21/22 1630 08/21/22 1700 08/21/22 1730  BP: 133/65 137/87 129/68 123/78  Pulse: (!) 103 (!) 114 83 89  Resp: (!) 22 19 18  (!) 22  Temp:      SpO2: 98% 96% 97% 94%  Weight:      Height:       Constitutional: appears older than chronological age, confused Eyes: PERRL, lids and conjunctivae normal ENMT: Mucous membranes are moist. Posterior pharynx clear of any exudate or lesions.  Poor dentition. Hearing  appropriate Neck: normal, supple, no masses, no thyromegaly Respiratory: clear to auscultation bilaterally, no wheezing, no crackles. Normal respiratory effort. No accessory muscle use.  Cardiovascular: Regular rate and rhythm, no murmurs / rubs / gallops. No extremity edema. 2+ pedal pulses. No carotid bruits.  Abdomen: Obese abdomen, no tenderness, no masses palpated, no hepatosplenomegaly. Bowel sounds positive.  Musculoskeletal: no clubbing / cyanosis. No joint deformity upper and lower  extremities. Good ROM, no contractures, no atrophy. Normal muscle tone.  Skin: no rashes, lesions, ulcers. No induration Neurologic: Sensation intact. Strength 5/5 in all 4.  Psychiatric: Not able to fully judgment and insight. Alert and oriented x 3 with intermittent confusion. Normal mood.   EKG: independently reviewed, showing atrial fibrillation with RVR, rate of 123, QTc 432  Chest x-ray on Admission: I personally reviewed and I agree with radiologist reading as below.  DG Chest Port 1 View  Result Date: 08/21/2022 CLINICAL DATA:  Short of breath for 2 days. EXAM: PORTABLE CHEST 1 VIEW COMPARISON:  06/17/2022. FINDINGS: Cardiac silhouette is normal in size. No mediastinal or hilar masses. Clear lungs.  No pleural effusion or pneumothorax. Skeletal structures are grossly intact. IMPRESSION: No active disease. Electronically Signed   By: Amie Portland M.D.   On: 08/21/2022 11:44    Labs on Admission: I have personally reviewed following labs  CBC: Recent Labs  Lab 08/21/22 1132  WBC 2.0*  NEUTROABS 0.9*  HGB 13.1  HCT 41.9  MCV 101.5*  PLT 76*   Basic Metabolic Panel: Recent Labs  Lab 08/21/22 1132  NA 137  K 3.7  CL 103  CO2 23  GLUCOSE 102*  BUN 17  CREATININE 1.30*  CALCIUM 8.0*   GFR: Estimated Creatinine Clearance: 44.2 mL/min (A) (by C-G formula based on SCr of 1.3 mg/dL (H)).  Liver Function Tests: Recent Labs  Lab 08/21/22 1132  AST 102*  ALT 38  ALKPHOS 80  BILITOT  1.2  PROT 7.9  ALBUMIN 3.1*   Recent Labs  Lab 08/21/22 1220  AMMONIA 19   Urine analysis:    Component Value Date/Time   COLORURINE YELLOW (A) 06/17/2022 1550   APPEARANCEUR CLOUDY (A) 06/17/2022 1550   APPEARANCEUR Hazy 10/11/2013 1705   LABSPEC 1.018 06/17/2022 1550   LABSPEC 1.012 10/11/2013 1705   PHURINE 5.0 06/17/2022 1550   GLUCOSEU NEGATIVE 06/17/2022 1550   GLUCOSEU >=500 10/11/2013 1705   HGBUR MODERATE (A) 06/17/2022 1550   BILIRUBINUR NEGATIVE 06/17/2022 1550   BILIRUBINUR Negative 10/11/2013 1705   KETONESUR 5 (A) 06/17/2022 1550   PROTEINUR 100 (A) 06/17/2022 1550   NITRITE NEGATIVE 06/17/2022 1550   LEUKOCYTESUR SMALL (A) 06/17/2022 1550   LEUKOCYTESUR 1+ 10/11/2013 1705   CRITICAL CARE Performed by: Dr. Sedalia Muta  Total critical care time: 35 minutes  Critical care time was exclusive of separately billable procedures and treating other patients.  Critical care was necessary to treat or prevent imminent or life-threatening deterioration.  Critical care was time spent personally by me on the following activities: development of treatment plan with patient and/or surrogate as well as nursing, discussions with consultants, evaluation of patient's response to treatment, examination of patient, obtaining history from patient or surrogate, ordering and performing treatments and interventions, ordering and review of laboratory studies, ordering and review of radiographic studies, pulse oximetry and re-evaluation of patient's condition.  This document was prepared using Dragon Voice Recognition software and may include unintentional dictation errors.  Dr. Sedalia Muta [facility]  If 7PM-7AM, please contact overnight-coverage provider If 7AM-7PM, please contact day attending provider [email or web address]  08/21/2022, 5:58 PM

## 2022-08-21 NOTE — Assessment & Plan Note (Addendum)
Insulin SSI with at bedtime coverage Blood glucose level target is 140-180 while inpatient

## 2022-08-21 NOTE — Assessment & Plan Note (Signed)
Suspect demand ischemia secondary to severe sepsis No clinical suspicion for acs at this time Continue close monitoring in ICU

## 2022-08-21 NOTE — Assessment & Plan Note (Addendum)
Chronic over last 2 months, presumed secondary to liver cirrhosis in the

## 2022-08-22 ENCOUNTER — Inpatient Hospital Stay (HOSPITAL_COMMUNITY)
Admit: 2022-08-22 | Discharge: 2022-08-22 | Disposition: A | Discharge status: Home / Self Care | Payer: 59 | Attending: Osteopathic Medicine | Admitting: Osteopathic Medicine

## 2022-08-22 DIAGNOSIS — R7989 Other specified abnormal findings of blood chemistry: Secondary | ICD-10-CM

## 2022-08-22 DIAGNOSIS — I4891 Unspecified atrial fibrillation: Secondary | ICD-10-CM

## 2022-08-22 DIAGNOSIS — A419 Sepsis, unspecified organism: Secondary | ICD-10-CM | POA: Diagnosis present

## 2022-08-22 LAB — BASIC METABOLIC PANEL
Anion gap: 9 (ref 5–15)
BUN: 13 mg/dL (ref 8–23)
CO2: 21 mmol/L — ABNORMAL LOW (ref 22–32)
Calcium: 7.3 mg/dL — ABNORMAL LOW (ref 8.9–10.3)
Chloride: 109 mmol/L (ref 98–111)
Creatinine, Ser: 1.01 mg/dL — ABNORMAL HIGH (ref 0.44–1.00)
GFR, Estimated: >60 mL/min (ref >60)
Glucose, Bld: 61 mg/dL — ABNORMAL LOW (ref 70–99)
Potassium: 3.3 mmol/L — ABNORMAL LOW (ref 3.5–5.1)
Sodium: 139 mmol/L (ref 135–145)

## 2022-08-22 LAB — LACTIC ACID, PLASMA: Lactic Acid, Venous: 2.2 mmol/L (ref 0.5–1.9)

## 2022-08-22 LAB — CBC
HCT: 33.6 % — ABNORMAL LOW (ref 36.0–46.0)
Hemoglobin: 10.8 g/dL — ABNORMAL LOW (ref 12.0–15.0)
MCH: 32.7 pg (ref 26.0–34.0)
MCHC: 32.1 g/dL (ref 30.0–36.0)
MCV: 101.8 fL — ABNORMAL HIGH (ref 80.0–100.0)
Platelets: 111 10*3/uL — ABNORMAL LOW (ref 150–400)
RBC: 3.3 MIL/uL — ABNORMAL LOW (ref 3.87–5.11)
RDW: 14.9 % (ref 11.5–15.5)
WBC: 2.2 10*3/uL — ABNORMAL LOW (ref 4.0–10.5)
nRBC: 0 % (ref 0.0–0.2)

## 2022-08-22 LAB — ECHOCARDIOGRAM COMPLETE
AR max vel: 2.59 cm2
AV Area VTI: 1.82 cm2
AV Area mean vel: 2.54 cm2
AV Mean grad: 2 mmHg
AV Peak grad: 2.8 mmHg
Ao pk vel: 0.84 m/s
Area-P 1/2: 4.24 cm2
Height: 66 in
S' Lateral: 2.2 cm
Weight: 2821.89 oz

## 2022-08-22 LAB — HEPATIC FUNCTION PANEL
ALT: 31 U/L (ref 0–44)
AST: 77 U/L — ABNORMAL HIGH (ref 15–41)
Albumin: 2.6 g/dL — ABNORMAL LOW (ref 3.5–5.0)
Alkaline Phosphatase: 71 U/L (ref 38–126)
Bilirubin, Direct: 0.6 mg/dL — ABNORMAL HIGH (ref 0.0–0.2)
Indirect Bilirubin: 1 mg/dL — ABNORMAL HIGH (ref 0.3–0.9)
Total Bilirubin: 1.6 mg/dL — ABNORMAL HIGH (ref 0.3–1.2)
Total Protein: 6.8 g/dL (ref 6.5–8.1)

## 2022-08-22 LAB — CULTURE, BLOOD (ROUTINE X 2): Culture: NO GROWTH

## 2022-08-22 LAB — GLUCOSE, CAPILLARY
Glucose-Capillary: 119 mg/dL — ABNORMAL HIGH (ref 70–99)
Glucose-Capillary: 124 mg/dL — ABNORMAL HIGH (ref 70–99)

## 2022-08-22 LAB — CBG MONITORING, ED
Glucose-Capillary: 46 mg/dL — ABNORMAL LOW (ref 70–99)
Glucose-Capillary: 95 mg/dL (ref 70–99)
Glucose-Capillary: 125 mg/dL — ABNORMAL HIGH (ref 70–99)

## 2022-08-22 MED ORDER — METOPROLOL TARTRATE 25 MG PO TABS
25.0000 mg | ORAL_TABLET | Freq: Two times a day (BID) | ORAL | Status: DC
  Administered 2022-08-22 – 2022-08-24 (×4): 25 mg via ORAL
  Filled 2022-08-22 (×4): qty 1

## 2022-08-22 MED ORDER — PERFLUTREN LIPID MICROSPHERE
1.0000 mL | INTRAVENOUS | Status: AC | PRN
  Administered 2022-08-22: 3 mL via INTRAVENOUS

## 2022-08-22 MED ORDER — LABETALOL HCL 5 MG/ML IV SOLN
5.0000 mg | INTRAVENOUS | Status: DC | PRN
  Filled 2022-08-22: qty 4

## 2022-08-22 MED ORDER — VANCOMYCIN HCL 1250 MG/250ML IV SOLN
1250.0000 mg | INTRAVENOUS | Status: DC
  Administered 2022-08-22: 1250 mg via INTRAVENOUS
  Filled 2022-08-22 (×3): qty 250

## 2022-08-22 NOTE — ED Notes (Signed)
RN assisted pt to bedside commode and is now sitting up in recliner.

## 2022-08-22 NOTE — Progress Notes (Signed)
PROGRESS NOTE    GAETANA WHEELHOUSE   ZOX:[id number] DOB: [birth year]  DOA: 08/21/2022 Date of Service: 08/22/22 PCP: Carlean Jews, PA-C     Brief Narrative / Hospital Course:  Ms. 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 Marland Kitchen 05/04: admitted for Afib RVR. CXR nonacute. WBC low, path pending. UA no UTI. COVID and other respiratory PCR negative. Tx w/ Ondansetron 4 mg IV one-time dose, diltiazem gtt., sodium chloride 500 mL bolus.  05/05: rate controlled off dilt drip. Pt reports improvement overall but still very weak. PT/OT to see. She lives alone.    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)   Metabolic encephalopathy   Alcoholic cirrhosis of liver without ascites (HCC)   Type 2 diabetes mellitus (HCC)   Chronic heart failure with preserved ejection fraction (HFpEF) (HCC)   Hepatocellular carcinoma (HCC)   Mixed hyperlipidemia   Essential hypertension   Hepatic cirrhosis (HCC)   Paroxysmal atrial fibrillation (HCC)   Elevated LFTs   Thrombocytopenia (HCC)   History of cerebral aneurysm repair   Elevated troponin   Sepsis (HCC)   SIRS due to infectious process with acute organ dysfunction (HCC) No identified source of infection at this time, patient  had elevated HR and leukopenia, with acute kidney  injury and atrial fibrillation with RVR. Lactate elevated 4.7. Procalcitonin neg.  BCx pending.  Will d/c abx if BCx negative and no other infectious source found    AKI (acute kidney injury) (HCC) - resolved Multifactorial, in setting of prerenal in setting of GI loss and possible complication from SIRS/sepsis S/p LR 1 L bolus ordered on admission - stopped IV fluids.  Avoid nephrotoxic agents  Type 2 diabetes mellitus (HCC) Insulin SSI with at bedtime coverage Blood glucose level target is 140-180 while inpatient  Atrial fibrillation with RVR (HCC) --> rate controlled Etiology workup in progress, patient is meeting SIRS criteria with leukocytopenia No source of infection identified at this time Eliquis Metoprolol increased   Elevated LFTs AST elevated, compared to baseline elevation, treat per SIRS criteria and continue monitoring Follow Hepatic panel   Thrombocytopenia (HCC) Chronic over last 2 months, presumed secondary to liver cirrhosis  Chronic heart failure with preserved ejection fraction (HFpEF) (HCC) Appears compensated, not in acute exacerbation at this time strict I's and O's Stopped IV fluids   Alcoholic cirrhosis of liver without ascites (HCC) Counseling given for alcohol cessation given extensively at bedside Patient states "I am quitting today girlfriend "  Metabolic encephalopathy- improving Patient was able to tell me her name, age, location, current calendar year however patient not a good historian not able to tell me why she is in the hospital Intermittently she states she feels better with fluid and can dance, and telling me that she was vomiting a lot and feeling terrible  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  Patient has history of alcoholic liver cirrhosis, last drink was 08/20/2022.   If  indicated will order CIWA   Elevated troponin Suspect demand ischemia secondary to severe sepsis No clinical suspicion for acs at this time    DVT prophylaxis: Eliquis Pertinent IV fluids/nutrition: stopped IV fliods,  Central lines / invasive devices: cardiac/carb diet   Code Status: DNR ACP documentation reviewed: 08/22/22 reviewed DNR and MOST   Current Admission Status: inpatient   TOC needs / Dispo plan: TBD, expect may need HH or SNF Barriers to discharge / significant pending items: if BCx negative and clinical improement expect medically stable tomorrow / day after              Subjective / Brief ROS:  Patient reports no concerns  Denies CP/SOB.  Pain controlled.  Denies new weakness.  Tolerating diet.  Reports no concerns w/ urination/defecation.   Family Communication: none at this time - pt declined call to any support persons     Objective Findings:  Vitals:   08/22/22 0700 08/22/22 0730 08/22/22 1130 08/22/22 1410  BP: 127/71 136/79 (!) 141/87 (!) 172/102  Pulse: 94 (!) 102 (!) 101 82  Resp: 15 18 20 18   Temp:  98.3 F (36.8 C) 98 F (36.7 C) 97.7 F (36.5 C)  TempSrc:    Oral  SpO2: 98% 98% 100% 100%  Weight:      Height:        Intake/Output Summary (Last 24 hours) at 08/22/2022 1426 Last data filed at 08/22/2022 0446 Gross per 24 hour  Intake 1906.55 ml  Output --  Net 1906.55 ml   Filed Weights   08/21/22 1157  Weight: 80 kg    Examination:  Physical Exam Constitutional:      General: She is not in acute distress.    Appearance: She is obese. She is not ill-appearing.  Cardiovascular:     Rate and Rhythm: Normal rate. Rhythm irregular.  Pulmonary:     Effort: Pulmonary effort is normal.     Breath sounds: Normal breath sounds.  Musculoskeletal:     Right lower leg: No edema.     Left lower leg: No edema.  Skin:    General: Skin is warm and dry.  Neurological:     General: No focal deficit present.     Mental Status:  She is alert.  Psychiatric:        Mood and Affect: Mood normal.        Behavior: Behavior normal.          Scheduled Medications:   allopurinol  100 mg Oral Daily   apixaban  5 mg Oral BID   escitalopram  10 mg Oral QHS   folic acid  1 mg Oral Daily   insulin aspart  0-5 Units Subcutaneous QHS   insulin aspart  0-9 Units Subcutaneous TID WC   insulin glargine-yfgn  28 Units Subcutaneous Daily   metoprolol tartrate  25 mg Oral BID   mirtazapine  7.5 mg Oral QHS   thiamine  100 mg Oral Daily   traZODone  50 mg Oral QHS    Continuous Infusions:  ceFEPime (MAXIPIME) IV Stopped (08/22/22 0401)   vancomycin      PRN Medications:  acetaminophen **OR** acetaminophen, labetalol, LORazepam, ondansetron **OR** ondansetron (ZOFRAN) IV, senna-docusate  Antimicrobials from admission:  Anti-infectives (From admission, onward)    Start  Dose/Rate Route Frequency Ordered Stop   08/22/22 1800  vancomycin (VANCOREADY) IVPB 1250 mg/250 mL        1,250 mg 166.7 mL/hr over 90 Minutes Intravenous Every 24 hours 08/22/22 0944     08/21/22 1600  ceFEPIme (MAXIPIME) 2 g in sodium chloride 0.9 % 100 mL IVPB        2 g 200 mL/hr over 30 Minutes Intravenous Every 12 hours 08/21/22 1538     08/21/22 1545  vancomycin (VANCOREADY) IVPB 2000 mg/400 mL        2,000 mg 200 mL/hr over 120 Minutes Intravenous  Once 08/21/22 1538 08/21/22 1951   08/21/22 1538  vancomycin variable dose per unstable renal function (pharmacist dosing)  Status:  Discontinued         Does not apply See admin instructions 08/21/22 1538 08/22/22 0944           Data Reviewed:  I have personally reviewed the following...  CBC: Recent Labs  Lab 08/21/22 1132 08/22/22 0729  WBC 2.0* 2.2*  NEUTROABS 0.9*  --   HGB 13.1 10.8*  HCT 41.9 33.6*  MCV 101.5* 101.8*  PLT 76* 111*   Basic Metabolic Panel: Recent Labs  Lab 08/21/22 1132 08/22/22 0750  NA 137 139  K 3.7 3.3*  CL 103 109  CO2 23 21*  GLUCOSE  102* 61*  BUN 17 13  CREATININE 1.30* 1.01*  CALCIUM 8.0* 7.3*   GFR: Estimated Creatinine Clearance: 56.9 mL/min (A) (by C-G formula based on SCr of 1.01 mg/dL (H)). Liver Function Tests: Recent Labs  Lab 08/21/22 1132 08/22/22 0750  AST 102* 77*  ALT 38 31  ALKPHOS 80 71  BILITOT 1.2 1.6*  PROT 7.9 6.8  ALBUMIN 3.1* 2.6*   No results for input(s): "LIPASE", "AMYLASE" in the last 168 hours. Recent Labs  Lab 08/21/22 1220  AMMONIA 19   Coagulation Profile: No results for input(s): "INR", "PROTIME" in the last 168 hours. Cardiac Enzymes: No results for input(s): "CKTOTAL", "CKMB", "CKMBINDEX", "TROPONINI" in the last 168 hours. BNP (last 3 results) No results for input(s): "PROBNP" in the last 8760 hours. HbA1C: No results for input(s): "HGBA1C" in the last 72 hours. CBG: Recent Labs  Lab 08/21/22 1717 08/21/22 2028 08/22/22 0730 08/22/22 0806 08/22/22 1127  GLUCAP 148* 129* 46* 95 125*   Lipid Profile: No results for input(s): "CHOL", "HDL", "LDLCALC", "TRIG", "CHOLHDL", "LDLDIRECT" in the last 72 hours. Thyroid Function Tests: No results for input(s): "TSH", "T4TOTAL", "FREET4", "T3FREE", "THYROIDAB" in the last 72 hours. Anemia Panel: No results for input(s): "VITAMINB12", "FOLATE", "FERRITIN", "TIBC", "IRON", "RETICCTPCT" in the last 72 hours. Most Recent Urinalysis On File:     Component Value Date/Time   COLORURINE YELLOW (A) 08/21/2022 2022   APPEARANCEUR HAZY (A) 08/21/2022 2022   APPEARANCEUR Hazy 10/11/2013 1705   LABSPEC 1.014 08/21/2022 2022   LABSPEC 1.012 10/11/2013 1705   PHURINE 5.0 08/21/2022 2022   GLUCOSEU NEGATIVE 08/21/2022 2022   GLUCOSEU >=500 10/11/2013 1705   HGBUR SMALL (A) 08/21/2022 2022   BILIRUBINUR NEGATIVE 08/21/2022 2022   BILIRUBINUR Negative 10/11/2013 1705   KETONESUR NEGATIVE 08/21/2022 2022   PROTEINUR 30 (A) 08/21/2022 2022   NITRITE NEGATIVE 08/21/2022 2022   LEUKOCYTESUR NEGATIVE 08/21/2022 2022   LEUKOCYTESUR  1+ 10/11/2013 1705   Sepsis Labs: @LABRCNTIP (procalcitonin:4,lacticidven:4) Microbiology: Recent Results (from the past 240 hour(s))  Respiratory (~20 pathogens) panel by PCR     Status: None   Collection Time: 08/21/22  2:19 PM  Specimen: Respiratory  Result Value Ref Range Status   Adenovirus NOT DETECTED NOT DETECTED Final   Coronavirus 229E NOT DETECTED NOT DETECTED Final    Comment: (NOTE) The Coronavirus on the Respiratory Panel, DOES NOT test for the novel  Coronavirus (2019 nCoV)    Coronavirus HKU1 NOT DETECTED NOT DETECTED Final   Coronavirus NL63 NOT DETECTED NOT DETECTED Final   Coronavirus OC43 NOT DETECTED NOT DETECTED Final   Metapneumovirus NOT DETECTED NOT DETECTED Final   Rhinovirus / Enterovirus NOT DETECTED NOT DETECTED Final   Influenza A NOT DETECTED NOT DETECTED Final   Influenza B NOT DETECTED NOT DETECTED Final   Parainfluenza Virus 1 NOT DETECTED NOT DETECTED Final   Parainfluenza Virus 2 NOT DETECTED NOT DETECTED Final   Parainfluenza Virus 3 NOT DETECTED NOT DETECTED Final   Parainfluenza Virus 4 NOT DETECTED NOT DETECTED Final   Respiratory Syncytial Virus NOT DETECTED NOT DETECTED Final   Bordetella pertussis NOT DETECTED NOT DETECTED Final   Bordetella Parapertussis NOT DETECTED NOT DETECTED Final   Chlamydophila pneumoniae NOT DETECTED NOT DETECTED Final   Mycoplasma pneumoniae NOT DETECTED NOT DETECTED Final    Comment: Performed at [facility], [address], [place], [place] [address]  SARS Coronavirus 2 by RT PCR (hospital order, performed in [facility] hospital lab) *cepheid single result test*     Status: None   Collection Time: 08/21/22  2:19 PM   Specimen: Nasal Swab  Result Value Ref Range Status   SARS Coronavirus 2 by RT PCR NEGATIVE NEGATIVE Final    Comment: (NOTE) SARS-CoV-2 target nucleic acids are NOT DETECTED.  The SARS-CoV-2 RNA is generally detectable in upper and lower respiratory specimens during the acute phase  of infection. The lowest concentration of SARS-CoV-2 viral copies this assay can detect is 250 copies / mL. A negative result does not preclude SARS-CoV-2 infection and should not be used as the sole basis for treatment or other patient management decisions.  A negative result may occur with improper specimen collection / handling, submission of specimen other than nasopharyngeal swab, presence of viral mutation(s) within the areas targeted by this assay, and inadequate number of viral copies (<250 copies / mL). A negative result must be combined with clinical observations, patient history, and epidemiological information.  Fact Sheet for Patients:   RoadLapTop.co.za  Fact Sheet for Healthcare Providers: http://kim-miller.com/  This test is not yet approved or  cleared by the [place] FDA and has been authorized for detection and/or diagnosis of SARS-CoV-2 by FDA under an Emergency Use Authorization (EUA).  This EUA will remain in effect (meaning this test can be used) for the duration of the COVID-19 declaration under Section 564(b)(1) of the Act, 21 U.S.C. section 360bbb-3(b)(1), unless the authorization is terminated or revoked sooner.  Performed at [facility], [address] [place], [place] [address]   Culture, blood (Routine X 2) w Reflex to ID Panel     Status: None (Preliminary result)   Collection Time: 08/21/22  2:19 PM   Specimen: BLOOD  Result Value Ref Range Status   Specimen Description BLOOD BLOOD RIGHT HAND  Final   Special Requests   Final    BOTTLES DRAWN AEROBIC AND ANAEROBIC Blood Culture adequate volume   Culture   Final    NO GROWTH < 24 HOURS Performed at [facility], [address] [place], [place] [address]    Report Status PENDING  Incomplete  Culture, blood (Routine X 2)  w Reflex to ID Panel     Status: None (Preliminary result)   Collection Time: 08/21/22  2:19 PM    Specimen: BLOOD  Result Value Ref Range Status   Specimen Description BLOOD LEFT AC  Final   Special Requests   Final    BOTTLES DRAWN AEROBIC AND ANAEROBIC Blood Culture adequate volume   Culture   Final    NO GROWTH < 24 HOURS Performed at [facility], [address] [place], [place] [address]    Report Status PENDING  Incomplete      Radiology Studies last 3 days: DG Chest Port 1 View  Result Date: 08/21/2022 CLINICAL DATA:  Short of breath for 2 days. EXAM: PORTABLE CHEST 1 VIEW COMPARISON:  06/17/2022. FINDINGS: Cardiac silhouette is normal in size. No mediastinal or hilar masses. Clear lungs.  No pleural effusion or pneumothorax. Skeletal structures are grossly intact. IMPRESSION: No active disease. Electronically Signed   By: Amie Portland M.D.   On: 08/21/2022 11:44             LOS: 1 day       Sunnie Nielsen, DO [facility] 08/22/2022, 2:26 PM    Dictation software may have been used to generate the above note. Typos may occur and escape review in typed/dictated notes. Please contact Dr Lyn Hollingshead directly for clarity if needed.  Staff may message me via secure chat in Epic  but this may not receive an immediate response,  please page me for urgent matters!  If 7PM-7AM, please contact night coverage [email or web address]

## 2022-08-22 NOTE — ED Notes (Signed)
Pt up from recliner with x1 assistance to bedside commode. Pt with x1 small BM. Pt able to clean self. Underwear soiled, fresh underwear provided. Pt back into bed at this time, reconnected to [facility] monitoring. Pt denies further needs at this time. Lights dimmed per request, call light is in reach. VSS, NAD.

## 2022-08-22 NOTE — ED Notes (Signed)
Pt blood sugar 46. RN gave pt 8oz of grape juice and will reach blood sugar. Pt denies any current symptoms at this time and is A&Ox4.

## 2022-08-22 NOTE — Evaluation (Signed)
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 BP obtained prior to activity (140/94 mmHg) due to elevated BP at last reading.  Patient oriented to self, location, and time but states she came to the hospital when she was unable to get up while prior documentation states it was due to nausea and vomiting. Patient states she lives alone but her brother recently got her a 76 month old german shepherd / lab puppy that lives with her now. She states prior to hospitalization, she had help daily for 2.5 hours from an aide who helps her with everything except getting up to go to the bathroom, which she was able to do on her own. She ambulated with no AD in the house and used a [facility] or rollator in the community. She reports one fall in the last 6 months. She lives in a level entry single story apartment with a tub/shower with a shower chair and grab bars. Upon PT evaluation, patient required min A for bed mobility and transfers, and CGA to ambulate 70 feet with RW. She demo slow pace, short step length, stooped posture, and needed cuing for correct placement of B UE during transfers. Patient's hair observed to be very matted in the back. She completed her own pericare after BM and urination in toilet and wiped her bottom first, then her vulva with the same tissue after folding it. She did not wipe until  fully clean. Supine BP prior to session was 140/94 taken with normal sized cuff at left arm. HR variable but up to 120-137 in afib rhythm on telemetry just after ambulation. SpO2 WNL on room air. Patient appears to have experienced a decline in functional independence. Patient would benefit from skilled physical therapy to address impairments and functional limitations (see PT Problem List below) to work towards stated goals and return to PLOF or maximal functional independence.       Recommendations for follow up therapy are one component of a multi-disciplinary discharge planning process, led by the attending physician.  Recommendations may be updated based on patient status, additional functional criteria and insurance authorization.  Follow Up Recommendations Can patient physically be transported by private vehicle: Yes     Assistance Recommended at Discharge Frequent or constant Supervision/Assistance  Patient can return home with the following  A little help with walking and/or transfers;A little help with bathing/dressing/bathroom;Assistance with cooking/housework;Assist for transportation;Help with stairs or ramp for entrance    Equipment Recommendations Rolling walker (2 wheels)  Recommendations for Other Services       Functional Status Assessment Patient has had a recent decline in their functional status and demonstrates the ability to make significant improvements in function in a reasonable and predictable amount of time.     Precautions / Restrictions Precautions Precautions: Fall Restrictions Weight Bearing Restrictions: No      Mobility  Bed Mobility Overal bed mobility: Needs Assistance Bed Mobility: Supine to Sit     Supine to sit: Min assist, HOB elevated  General bed mobility comments: head of bed slightly elevated to resemble the pillows she lays on at home. She went supine to sit with min A hand held assist for her to pull with left UE. She states she  can get out of bed at home without assistance when aide is not there.    Transfers Overall transfer level: Needs assistance Equipment used: Rolling walker (2 wheels) Transfers: Sit to/from Stand Sit to Stand: Min assist           General transfer comment: Patient unable to stand from bed to RW without min A at gait belt. She did not shift forwards far enought to be able to get up and did not understand cues to shift forwards more. She also needed cuing for correct hand placement.    Ambulation/Gait Ambulation/Gait assistance: Supervision, Min guard Gait Distance (Feet): 60 Feet Assistive device: Rolling walker (2 wheels)   Gait velocity: reduced     General Gait Details: Patient ambulated to the bathroom, then approx 60 feet with RW with supervision - CGA. She was distracted by water bottles on a cart nearby and asked if it was real water. She then stated she was tired and needed to go back to the room.  She demo stooped posture, short step length, and slow pace.  Stairs            Wheelchair Mobility    Modified Rankin (Stroke Patients Only)       Balance Overall balance assessment: Needs assistance Sitting-balance support: Bilateral upper extremity supported Sitting balance-Leahy Scale: Good     Standing balance support: Bilateral upper extremity supported, Reliant on assistive device for balance, During functional activity Standing balance-Leahy Scale: Poor Standing balance comment: Patient reliant on RW to maintain balance while walking.                             Pertinent Vitals/Pain Pain Assessment Pain Assessment: No/denies pain    Home Living Family/patient expects to be discharged to:: Private residence Living Arrangements: Alone (states her brother just got her a german shepherd / lab puppy who is about 2 months old that lives with her.) Available Help at Discharge: Family;Available PRN/intermittently (aide 2.5 hours a day every day;  brothre pays her bills and gets food for her if she needs it. She gets Meals on Wheels) Type of Home: Apartment Home Access: Level entry       Home Layout: One level Home Equipment: Shower seat;Grab bars - toilet;Cane - single point;Rollator (4 wheels)      Prior Function Prior Level of Function : Needs assist;History of Falls (last six months)             Mobility Comments: Ambulates without any AD in house; uses [facility] or rollator outside. ADLs Comments: Patient states she is able to get up to go to the bathroom alone but needs help with dressing and bathing, housework, and food prep; brother pays her bills and brings her food; she gets Meals on Wheels. She states she fell once in the last 6 months when she tripped over her shoe going to the bathroom.     Hand Dominance        Extremity/Trunk Assessment   Upper Extremity Assessment Upper Extremity Assessment: Generalized weakness    Lower Extremity Assessment Lower Extremity Assessment: Generalized weakness    Cervical / Trunk Assessment Cervical / Trunk Assessment: Kyphotic  Communication   Communication: No difficulties  Cognition Arousal/Alertness: Awake/alert Behavior During Therapy: WFL for tasks assessed/performed Overall Cognitive Status: No family/caregiver present to determine baseline cognitive functioning                                 General Comments: Patient is oriented to self, location, and time. She states she came to the hospital becuase she could not get up.        General Comments General comments (skin integrity, edema, etc.): Patient's hair is very matted in the back. she completed her own pericare after BM and urination in toilet and wiped her bottom first, then her vulva with the same tissue after folding it. She did not wipe until fully clean. Supine BP prior to session was 140/94 taken with normal sized cuff at left arm. HR variable but up to 120-137 in afib rhythm on telemetry  just after ambulation. SpO2 WNL on room air.    Exercises Other Exercises Other Exercises: educated patient on role of PT in acute care setting, safe mobility techniques, good perihygene,   Assessment/Plan    PT Assessment Patient needs continued PT services  PT Problem List Decreased strength;Decreased balance;Decreased cognition;Obesity;Cardiopulmonary status limiting activity;Decreased activity tolerance;Decreased safety awareness       PT Treatment Interventions DME instruction;Functional mobility training;Balance training;Patient/family education;Gait training;Therapeutic activities;Neuromuscular re-education;Therapeutic exercise;Cognitive remediation    PT Goals (Current goals can be found in the Care Plan section)  Acute Rehab PT Goals Patient Stated Goal: get better PT Goal Formulation: With patient Time For Goal Achievement: 09/05/22 Potential to Achieve Goals: Good    Frequency Min 4X/week     Co-evaluation               AM-PAC PT "6 Clicks" Mobility  Outcome Measure Help needed turning from your back to your side while in a flat bed without using bedrails?: A Little Help needed moving from lying on your back to sitting on the side of a flat bed without using bedrails?: A Little Help needed moving to and from a bed to a chair (including a wheelchair)?: A Little Help needed standing up from a chair using your arms (e.g., wheelchair or bedside chair)?: A Little Help needed to walk in hospital room?: A Little Help needed climbing 3-5 steps with a railing? : A Little 6 Click Score: 18    End of Session Equipment Utilized During Treatment: Gait belt Activity Tolerance: Patient tolerated treatment well;Patient limited by fatigue Patient left: in chair;with chair alarm set;with call bell/phone within reach Nurse Communication: Mobility status PT Visit Diagnosis: Muscle weakness (generalized) (M62.81);Difficulty in walking, not elsewhere classified (R26.2);History  of falling (Z91.81)    Time: 1428-1500 PT Time Calculation (min) (ACUTE ONLY): 32 min   Charges:   PT Evaluation $PT Eval Moderate Complexity: 1 Mod          Karem Farha R. Ilsa Iha, PT, DPT 08/22/22, 3:25 PM

## 2022-08-22 NOTE — ED Notes (Signed)
Assumed care from Kara,RN. Pt resting comfortably in bed at this time. Pt denies any current needs or questions. Call light with in reach.   

## 2022-08-22 NOTE — Progress Notes (Signed)
Pharmacy Antibiotic Note  Adriana Miller is a 67 y.o. female admitted on 08/21/2022 with sepsis.  Pharmacy has been consulted for vancomycin and cefepime dosing.  Plan: Patient received vancomycin IV 2,000 mg x 1 loading dose. Starting vancomycin 1250 mg q24H. Predicted AUC 473. Goal AUC of 400-550. Plan to get vancomycin levels after 4th or 5th.    Cefepime IV 2 grams every 12 hours  Height: 5\' 6"  (167.6 cm) Weight: 80 kg (176 lb 5.9 oz) IBW/kg (Calculated) : 59.3  Temp (24hrs), Avg:98.3 F (36.8 C), Min:98 F (36.7 C), Max:98.6 F (37 C)  Recent Labs  Lab 08/21/22 1132 08/21/22 1419 08/21/22 1719 08/21/22 1951 08/22/22 0729 08/22/22 0750  WBC 2.0*  --   --   --  2.2*  --   CREATININE 1.30*  --   --   --   --  1.01*  LATICACIDVEN  --  3.1* 4.7* 3.8* 2.2*  --      Estimated Creatinine Clearance: 56.9 mL/min (A) (by C-G formula based on SCr of 1.01 mg/dL (H)).    Allergies  Allergen Reactions   Aspirin Other (See Comments)    Nose bleed    Antimicrobials this admission: vancomycin 5/4 >>  cefepime 5/4 >>   Dose adjustments this admission: N/a  Microbiology results: 5/4 BCx: in process   Thank you for allowing pharmacy to be a part of this patient's care.  Paschal Dopp, PharmD, BCPS Clinical Pharmacist  08/22/2022 9:50 AM

## 2022-08-23 ENCOUNTER — Telehealth: Payer: Self-pay | Admitting: Physician Assistant

## 2022-08-23 ENCOUNTER — Ambulatory Visit: Payer: 59 | Admitting: Physician Assistant

## 2022-08-23 DIAGNOSIS — I4891 Unspecified atrial fibrillation: Secondary | ICD-10-CM | POA: Diagnosis not present

## 2022-08-23 LAB — COMPREHENSIVE METABOLIC PANEL
ALT: 29 U/L (ref 0–44)
AST: 77 U/L — ABNORMAL HIGH (ref 15–41)
Albumin: 2.3 g/dL — ABNORMAL LOW (ref 3.5–5.0)
Alkaline Phosphatase: 64 U/L (ref 38–126)
Anion gap: 9 (ref 5–15)
BUN: 11 mg/dL (ref 8–23)
CO2: 21 mmol/L — ABNORMAL LOW (ref 22–32)
Calcium: 7.8 mg/dL — ABNORMAL LOW (ref 8.9–10.3)
Chloride: 106 mmol/L (ref 98–111)
Creatinine, Ser: 1.01 mg/dL — ABNORMAL HIGH (ref 0.44–1.00)
GFR, Estimated: >60 mL/min (ref >60)
Glucose, Bld: 87 mg/dL (ref 70–99)
Potassium: 3.2 mmol/L — ABNORMAL LOW (ref 3.5–5.1)
Sodium: 136 mmol/L (ref 135–145)
Total Bilirubin: 1.9 mg/dL — ABNORMAL HIGH (ref 0.3–1.2)
Total Protein: 6.2 g/dL — ABNORMAL LOW (ref 6.5–8.1)

## 2022-08-23 LAB — CBC
HCT: 33.8 % — ABNORMAL LOW (ref 36.0–46.0)
Hemoglobin: 11.2 g/dL — ABNORMAL LOW (ref 12.0–15.0)
MCH: 32.7 pg (ref 26.0–34.0)
MCHC: 33.1 g/dL (ref 30.0–36.0)
MCV: 98.8 fL (ref 80.0–100.0)
Platelets: 51 10*3/uL — ABNORMAL LOW (ref 150–400)
RBC: 3.42 MIL/uL — ABNORMAL LOW (ref 3.87–5.11)
RDW: 13.6 % (ref 11.5–15.5)
WBC: 1.9 10*3/uL — ABNORMAL LOW (ref 4.0–10.5)
nRBC: 0 % (ref 0.0–0.2)

## 2022-08-23 LAB — TECHNOLOGIST SMEAR REVIEW

## 2022-08-23 LAB — PATHOLOGIST SMEAR REVIEW

## 2022-08-23 LAB — CULTURE, BLOOD (ROUTINE X 2)
Special Requests: ADEQUATE
Special Requests: ADEQUATE

## 2022-08-23 LAB — GLUCOSE, CAPILLARY
Glucose-Capillary: 60 mg/dL — ABNORMAL LOW (ref 70–99)
Glucose-Capillary: 168 mg/dL — ABNORMAL HIGH (ref 70–99)
Glucose-Capillary: 133 mg/dL — ABNORMAL HIGH (ref 70–99)
Glucose-Capillary: 135 mg/dL — ABNORMAL HIGH (ref 70–99)

## 2022-08-23 MED ORDER — FUROSEMIDE 20 MG PO TABS
20.0000 mg | ORAL_TABLET | Freq: Every day | ORAL | Status: DC
  Administered 2022-08-24: 20 mg via ORAL
  Filled 2022-08-23: qty 1

## 2022-08-23 MED ORDER — SPIRONOLACTONE 25 MG PO TABS
25.0000 mg | ORAL_TABLET | Freq: Every day | ORAL | Status: DC
  Administered 2022-08-23 – 2022-08-24 (×2): 25 mg via ORAL
  Filled 2022-08-23 (×2): qty 1

## 2022-08-23 MED ORDER — POTASSIUM CHLORIDE CRYS ER 20 MEQ PO TBCR
40.0000 meq | EXTENDED_RELEASE_TABLET | Freq: Once | ORAL | Status: AC
  Administered 2022-08-23: 40 meq via ORAL
  Filled 2022-08-23: qty 2

## 2022-08-23 NOTE — Inpatient Diabetes Management (Signed)
Inpatient Diabetes Program Recommendations  AACE/ADA: New Consensus Statement on Inpatient Glycemic Control   Target Ranges:  Prepandial:   less than 140 mg/dL      Peak postprandial:   less than 180 mg/dL (1-2 hours)      Critically ill patients:  140 - 180 mg/dL    Latest Reference Range & Units 08/22/22 07:30 08/22/22 08:06 08/22/22 11:27 08/22/22 15:57 08/22/22 21:00 08/23/22 08:17 08/23/22 11:39  Glucose-Capillary 70 - 99 mg/dL 46 (L) 95 161 (H) 096 (H) 124 (H) 60 (L) 168 (H)    Review of Glycemic Control  Diabetes history: DM2 Outpatient Diabetes medications: Toujeo 28 units QPM, Amaryl 1 mg QAM Current orders for Inpatient glycemic control: Semglee 28 units daily, Novolog 0-9 units TID with meals, Novolog 0-5 units QHS  Inpatient Diabetes Program Recommendations:    Insulin: Last given Semglee 28 units on 08/21/22. Fasting CBG 46 mg/dl on 0/4/54 and 60 mg/dl today. Please consider discontinuing Semglee for know and can add back low dose Semglee if glucose becomes consistently elevated.   Thanks, Orlando Penner, RN, MSN, CDCES Diabetes Coordinator Inpatient Diabetes Program [phone] (Team Pager from 8am to 5pm)

## 2022-08-23 NOTE — Telephone Encounter (Signed)
Pt phone # [phone] is not in service and pt is currently in hospital

## 2022-08-23 NOTE — Evaluation (Signed)
Occupational Therapy Evaluation Patient Details Name: Adriana Miller MRN: [id number] DOB: 1955-09-07 Today's Date: 08/23/2022   History of Present Illness 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 Prior to hospital admission, pt was ambulating without AD in the home, PRN SPC versus RW in community, 1 fall/92mo, and requiring assist for bathing, dressing, and all ADL from aide/son/RN.  Pt lives alone with a puppy. Pt presents to acute OT demonstrating impaired ADL performance and functional mobility 2/2 decreased balance, strength, activity tolerance, and cardiopulmonary status (See OT problem list). Pt in Afib throughout. Pt currently requires supv for bed mobility, MIN A for t/f from EOB, CGA for ADL mobility with RW, CGA for toilet transfer and standing pericare. HR at rest 97-117, up to 120-147. Pt would benefit from additional skilled OT services to address noted impairments and functional limitations (see below for any additional details) in order to maximize safety and independence while minimizing falls risk and caregiver burden. Upon hospital discharge, recommend additional skilled OT and increased caregiver support to maximize pt safety with return home and to support return to functional independence during meaningful occupations of daily life.     Recommendations for follow up therapy are one component of a multi-disciplinary discharge planning process, led by the attending physician.  Recommendations may be updated based on patient status, additional  functional criteria and insurance authorization.   Assistance Recommended at Discharge Frequent or constant Supervision/Assistance  Patient can return home with the following A little help with walking and/or transfers;A little help with bathing/dressing/bathroom;Direct supervision/assist for medications management;Assistance with cooking/housework;Assist for transportation;Help with stairs or ramp for entrance    Functional Status Assessment  Patient has had a recent decline in their functional status and demonstrates the ability to make significant improvements in function in a reasonable and predictable amount of time.  Equipment Recommendations  Other (comment) (2WW)    Recommendations for Other Services       Precautions / Restrictions Precautions Precautions: Fall Restrictions Weight Bearing Restrictions: No      Mobility Bed Mobility Overal bed mobility: Needs Assistance Bed Mobility: Supine to Sit     Supine to sit: Supervision, HOB elevated     General bed mobility comments: Incr time/effort, use of bed rail to complete but no direct assist required    Transfers Overall transfer level: Needs assistance Equipment used: Rolling walker (2 wheels) Transfers: Sit to/from Stand Sit to Stand: Min assist           General transfer comment: VC for anterior weight shift EOB prior to attempt, good use of BUE to push up from EOB, ultimately MIN A to complete wiht RW      Balance Overall balance assessment: Needs assistance Sitting-balance support: Single extremity supported, Bilateral upper extremity supported, Feet supported Sitting balance-Leahy Scale: Good     Standing balance support: During functional activity Standing balance-Leahy Scale: Fair Standing balance comment: Pt stood at sink within RW frame to wash hands at sink with no LOB  ADL either performed or assessed with clinical judgement   ADL                                          General ADL Comments: Pt required CGA for toilet transfer (BSC over toilet), CGA for pericare in standing and for grooming at sink with RW, cue for maintaining RW in front of her throughout     Vision         Perception     Praxis      Pertinent Vitals/Pain Pain Assessment Pain Assessment: No/denies pain     Hand Dominance     Extremity/Trunk Assessment Upper Extremity Assessment Upper Extremity Assessment: Generalized weakness   Lower Extremity Assessment Lower Extremity Assessment: Generalized weakness   Cervical / Trunk Assessment Cervical / Trunk Assessment: Kyphotic   Communication Communication Communication: No difficulties   Cognition Arousal/Alertness: Awake/alert Behavior During Therapy: WFL for tasks assessed/performed Overall Cognitive Status: No family/caregiver present to determine baseline cognitive functioning                                 General Comments: Pt pleasant, oriented, follows simple commands well, decr safety awareness/awareness of deficits     General Comments  HR at rest 97-117, up to 120-147 Afib throughout session    Exercises Other Exercises Other Exercises: Pt educated in activity pacing and PLB to support participation in ADL/mobility, RW safety   Shoulder Instructions      Home Living Family/patient expects to be discharged to:: Private residence Living Arrangements: Alone (states her brother just got her a german shepherd / lab puppy who is about 2 months old that lives with her.) Available Help at Discharge: Family;Available PRN/intermittently;Personal care attendant (aide 2.5 hours a day every day; brothre pays her bills and gets food for her if she needs it. She gets Meals on Wheels; Theme park manager up weekly pill box) Type of Home: Apartment Home Access: Level entry     Home Layout: One level     Bathroom Shower/Tub: Chief Strategy Officer: Handicapped height      Home Equipment: Shower seat;Grab bars - toilet;Cane - single point;Rollator (4 wheels)          Prior Functioning/Environment Prior Level of Function : Needs assist;History of Falls (last six months)             Mobility Comments: Ambulates without any AD in house; uses [facility] or rollator outside. ADLs Comments: Patient states she is able to get up to go to the bathroom alone but needs help with dressing and bathing, housework, and food prep; brother pays her bills and brings her food; she gets Meals on Wheels. She states she fell once in the last 6 months when she tripped over her shoe going to the bathroom. RN sets up weekly pill box        OT Problem List: Decreased strength;Decreased activity tolerance;Cardiopulmonary status limiting activity;Decreased safety awareness;Impaired balance (sitting and/or standing);Decreased knowledge of use of DME or AE      OT Treatment/Interventions: Self-care/ADL training;Therapeutic exercise;Patient/family education;Balance training;Energy conservation;Therapeutic activities;DME and/or AE instruction    OT Goals(Current goals can be found in the care plan section) Acute Rehab OT Goals Patient Stated Goal: go home OT Goal Formulation: With patient Time For Goal Achievement: 09/06/22 Potential to Achieve  Goals: Good ADL Goals Pt Will Perform Upper Body Dressing: with set-up;with supervision;sitting Pt Will Perform Lower Body Dressing: with min guard assist;sit to/from stand;with adaptive equipment Pt Will Transfer to Toilet: with supervision;ambulating (elevated commode, LRAD) Pt Will Perform Toileting - Clothing Manipulation and hygiene: with modified independence Additional ADL Goal #1: Pt will verbalize plan to implement at least 1 learned falls prevention strategy in home.  OT Frequency: Min 3X/week    Co-evaluation              AM-PAC OT "6 Clicks" Daily Activity     Outcome Measure Help from another person eating meals?:  None Help from another person taking care of personal grooming?: A Little Help from another person toileting, which includes using toliet, bedpan, or urinal?: A Little Help from another person bathing (including washing, rinsing, drying)?: A Little Help from another person to put on and taking off regular upper body clothing?: A Little Help from another person to put on and taking off regular lower body clothing?: A Little 6 Click Score: 19   End of Session Equipment Utilized During Treatment: Gait belt;Rolling walker (2 wheels)  Activity Tolerance: Patient tolerated treatment well Patient left: in chair;with call bell/phone within reach;Other (comment) (PT)  OT Visit Diagnosis: Other abnormalities of gait and mobility (R26.89);Muscle weakness (generalized) (M62.81);History of falling (Z91.81)                Time: 0981-1914 OT Time Calculation (min): 12 min Charges:  OT General Charges $OT Visit: 1 Visit OT Evaluation $OT Eval Low Complexity: 1 Low  Arman Filter., MPH, MS, OTR/L ascom [phone] 08/23/22, 9:31 AM

## 2022-08-23 NOTE — Progress Notes (Signed)
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 Pt able to ambulate in hallway and HR monitored ranging from 113bpm to 138bpm with exertion. Pt fatigues and requests to return back to room. Poor safety awareness. Sent secure chat to [facility] about increased assistance in home environment. May be able to upgrade recommendation if pt continues to progress. Will continue to progress as able.  Recommendations for follow up therapy are one component of a multi-disciplinary discharge planning process, led by the attending physician.  Recommendations may be updated based on patient status, additional functional criteria and insurance authorization.  Follow Up Recommendations  Can patient physically be transported by private vehicle: Yes    Assistance Recommended at Discharge Frequent or constant Supervision/Assistance  Patient can return home with the following A little help with walking and/or transfers;A little help with bathing/dressing/bathroom;Assistance with cooking/housework;Assist for transportation;Help with stairs or ramp for entrance   Equipment Recommendations  Rolling walker (2 wheels)    Recommendations for Other Services       Precautions / Restrictions Precautions Precautions: Fall Restrictions Weight Bearing Restrictions: No      Mobility  Bed Mobility               General bed mobility comments: NT received in recliner pre/post session    Transfers Overall transfer level: Needs assistance Equipment used: Rolling walker (2 wheels) Transfers: Sit to/from Stand Sit to Stand: Min assist           General transfer comment: cues for hand placement. Once standing, upright posture    Ambulation/Gait Ambulation/Gait assistance: Min guard Gait Distance (Feet): 100 Feet Assistive device: Rolling walker (2 wheels) Gait Pattern/deviations: Step-through pattern       General Gait Details: ambulated in hallway, limited by elevated HR increasing from 113 to 138bpm with exertion. Poor safety awareness using AD with cues for obstacle avoidance. DOes fatigues and request to return back to room   Stairs             Wheelchair Mobility    Modified Rankin (Stroke Patients Only)       Balance Overall balance assessment: Needs assistance Sitting-balance support: Single extremity supported, Bilateral upper extremity supported, Feet supported Sitting balance-Leahy Scale: Good     Standing balance support: During functional activity Standing balance-Leahy Scale: Fair                              Cognition Arousal/Alertness: Awake/alert Behavior During Therapy: WFL for tasks assessed/performed Overall Cognitive Status: No family/caregiver present to determine baseline cognitive functioning                                 General Comments: pleasant and agreeable to session, appears to have poor safety awareness and insight into deficits  Exercises Other Exercises Other Exercises: assisted patient in breakfast set up. Agreeable to sit in recliner for meal. Needed assist for opening items    General Comments General comments (skin integrity, edema, etc.): HR at rest 97-117, up to 120-147 Afib throughout session      Pertinent Vitals/Pain Pain  Assessment Pain Assessment: No/denies pain    Home Living Family/patient expects to be discharged to:: Private residence Living Arrangements: Alone (states her brother just got her a german shepherd / lab puppy who is about 2 months old that lives with her.) Available Help at Discharge: Family;Available PRN/intermittently;Personal care attendant (aide 2.5 hours a day every day; brothre pays her bills and gets food for her if she needs it. She gets Meals on Wheels; Theme park manager up weekly pill box) Type of Home: Apartment Home Access: Level entry       Home Layout: One level Home Equipment: Shower seat;Grab bars - toilet;Cane - single point;Rollator (4 wheels)      Prior Function            PT Goals (current goals can now be found in the care plan section) Acute Rehab PT Goals Patient Stated Goal: get better PT Goal Formulation: With patient Time For Goal Achievement: 09/05/22 Potential to Achieve Goals: Good Progress towards PT goals: Progressing toward goals    Frequency    Min 4X/week      PT Plan Current plan remains appropriate    Co-evaluation              AM-PAC PT "6 Clicks" Mobility   Outcome Measure  Help needed turning from your back to your side while in a flat bed without using bedrails?: A Little Help needed moving from lying on your back to sitting on the side of a flat bed without using bedrails?: A Little Help needed moving to and from a bed to a chair (including a wheelchair)?: A Little Help needed standing up from a chair using your arms (e.g., wheelchair or bedside chair)?: A Little Help needed to walk in hospital room?: A Little Help needed climbing 3-5 steps with a railing? : A Little 6 Click Score: 18    End of Session Equipment Utilized During Treatment: Gait belt Activity Tolerance: Patient tolerated treatment well;Patient limited by fatigue Patient left: in chair;with chair alarm set;with call bell/phone within reach Nurse Communication:  Mobility status PT Visit Diagnosis: Muscle weakness (generalized) (M62.81);Difficulty in walking, not elsewhere classified (R26.2);History of falling (Z91.81)     Time: 0911-0920 PT Time Calculation (min) (ACUTE ONLY): 9 min  Charges:  $Gait Training: 8-22 mins                     Elizabeth Palau, PT, DPT, GCS [phone]    Khaiden Segreto 08/23/2022, 10:22 AM

## 2022-08-23 NOTE — Progress Notes (Signed)
PROGRESS NOTE    Adriana Miller   UJW:[id number] DOB: January 07, 1956  DOA: 08/21/2022 Date of Service: 08/23/22 PCP: Carlean Jews, PA-C     Brief Narrative / Hospital Course:  Adriana Miller is a 67 year old female with history of paroxysmal atrial fibrillation on Eliquis, liver cirrhosis secondary to alcohol use, no history of ascites, alcohol abuse, insulin-dependent diabetes mellitus, hepatocellular carcinoma, hypertension, heart failure with preserved ejection fraction, who presents to emergency department for chief concerns of intractable nausea and vomiting upon waking up in the morning, unable to move out of her chair. Marland Kitchen 05/04: admitted for Afib RVR. CXR nonacute. WBC low, path pending. UA no UTI. COVID and other respiratory PCR negative. Tx w/ Ondansetron 4 mg IV one-time dose, diltiazem gtt., sodium chloride 500 mL bolus.  05/05: rate controlled off dilt drip. Pt reports improvement overall but still very weak. PT/OT to see. She lives alone.  05/06: stopping abx, BCx NGx2d. 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)   Metabolic encephalopathy   Alcoholic cirrhosis of liver without ascites (HCC)   Type 2 diabetes mellitus (HCC)   Chronic heart failure with preserved ejection fraction (HFpEF) (HCC)   Hepatocellular carcinoma (HCC)   Mixed hyperlipidemia   Essential hypertension   Hepatic cirrhosis (HCC)   Paroxysmal atrial fibrillation (HCC)   Elevated LFTs   Thrombocytopenia (HCC)   History of cerebral aneurysm repair   Elevated troponin   Sepsis (HCC)   SIRS due to AFib RVR R/o sepsis w/ infectious process with acute organ dysfunction (HCC) No identified source  of infection at this time, patient  had elevated HR and leukopenia, with acute kidney injury and atrial fibrillation with RVR. Lactate elevated 4.7. Procalcitonin neg.  BCx pending.  Will d/c abx if BCx negative and no other infectious source found   AKI (acute kidney injury) on CKD3a (HCC) - resolved Multifactorial, in setting of prerenal in setting of GI loss and possible complication from SIRS/sepsis S/p LR 1 L bolus ordered on admission - stopped IV fluids.  Avoid nephrotoxic agents Trend BMP  Type 2 diabetes mellitus (HCC) Holding insulin d/t hypoglycemia  Glc ac   Atrial fibrillation with RVR (HCC) --> rate controlled Etiology workup in progress Question meeting SIRS criteria with leukocytopenia No source of infection identified at this time Eliquis Metoprolol increased   Leukopenia  Differential shows neutropenia, 0.9, new Path smear Follow CBC  Elevated LFTs AST elevated, compared to baseline elevation, treat per SIRS criteria and continue monitoring Follow Hepatic panel   Thrombocytopenia (HCC) Chronic over last 2 months, presumed secondary to liver cirrhosis  Chronic heart failure with preserved ejection fraction (HFpEF) (HCC) Appears compensated, not in acute exacerbation at this time strict I's and O's Stopped IV fluids   Alcoholic cirrhosis of liver without ascites (HCC) Counseling given for alcohol cessation given extensively at bedside Patient states "I am quitting today girlfriend "  Metabolic encephalopathy- improving Patient was able to tell me her name, age, location, current calendar year however patient not a good historian not able to tell me why she is in the hospital Intermittently she states she feels better with fluid and can dance, and  telling me that she was vomiting a lot and feeling terrible  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  Patient has history of alcoholic liver cirrhosis, last drink was 08/20/2022.   If indicated will order CIWA   Elevated troponin Suspect demand ischemia secondary to severe sepsis No clinical suspicion for acs at this time    DVT prophylaxis: Eliquis Pertinent IV fluids/nutrition: stopped IV fliods,  Central lines / invasive devices: cardiac/carb diet   Code Status: DNR ACP documentation reviewed: 08/22/22 reviewed DNR and MOST   Current Admission Status: inpatient   TOC needs / Dispo plan: TBD, expect may need HH or SNF Barriers to discharge / significant pending items: if BCx negative and clinical improement expect medically stable tomorrow / day after              Subjective / Brief ROS:  Patient reports no concerns  Denies CP/SOB.  Pain controlled.  Denies new weakness.  Tolerating diet.  Reports no concerns w/ urination/defecation.   Family Communication: none at this time - pt declined call to any support persons     Objective Findings:  Vitals:   08/22/22 2300 08/23/22 0400 08/23/22 0816 08/23/22 1139  BP: (!) 140/83 136/79 (!) 135/94 124/74  Pulse: 87 82 (!) 101 71  Resp: 18 20 14 16   Temp: 98.1 F (36.7 C) 98.3 F (36.8 C) 98 F (36.7 C) 98.1 F (36.7 C)  TempSrc: Oral Oral Oral   SpO2: 98% 99% 97% 98%  Weight:      Height:        Intake/Output Summary (Last 24 hours) at 08/23/2022 1412 Last data filed at 08/23/2022 1050 Gross per 24 hour  Intake 1100 ml  Output 500 ml  Net 600 ml   Filed Weights   08/21/22 1157  Weight: 80 kg    Examination:  Physical Exam Constitutional:      General: She is not in acute distress.    Appearance: She is obese. She is not ill-appearing.  Cardiovascular:     Rate and Rhythm: Normal rate. Rhythm irregular.  Pulmonary:     Effort: Pulmonary effort is normal.     Breath sounds: Normal breath sounds.  Musculoskeletal:     Right lower leg: No edema.      Left lower leg: No edema.  Skin:    General: Skin is warm and dry.  Neurological:     General: No focal deficit present.     Mental Status: She is alert.  Psychiatric:        Mood and Affect: Mood normal.        Behavior: Behavior normal.          Scheduled Medications:   allopurinol  100 mg Oral Daily   apixaban  5 mg Oral BID   escitalopram  10 mg Oral QHS   folic acid  1 mg Oral Daily   [START ON 08/24/2022] furosemide  20 mg Oral Daily   metoprolol tartrate  25 mg Oral BID   mirtazapine  7.5 mg Oral QHS   spironolactone  25 mg Oral Daily   thiamine  100 mg Oral Daily   traZODone  50 mg Oral QHS    Continuous Infusions:    PRN Medications:  acetaminophen **OR** acetaminophen, labetalol, LORazepam, ondansetron **OR** ondansetron (ZOFRAN) IV, senna-docusate  Antimicrobials from admission:  Anti-infectives (  From admission, onward)    Start     Dose/Rate Route Frequency Ordered Stop   08/22/22 1800  vancomycin (VANCOREADY) IVPB 1250 mg/250 mL  Status:  Discontinued        1,250 mg 166.7 mL/hr over 90 Minutes Intravenous Every 24 hours 08/22/22 0944 08/23/22 1255   08/21/22 1600  ceFEPIme (MAXIPIME) 2 g in sodium chloride 0.9 % 100 mL IVPB  Status:  Discontinued        2 g 200 mL/hr over 30 Minutes Intravenous Every 12 hours 08/21/22 1538 08/23/22 1255   08/21/22 1545  vancomycin (VANCOREADY) IVPB 2000 mg/400 mL        2,000 mg 200 mL/hr over 120 Minutes Intravenous  Once 08/21/22 1538 08/21/22 1951   08/21/22 1538  vancomycin variable dose per unstable renal function (pharmacist dosing)  Status:  Discontinued         Does not apply See admin instructions 08/21/22 1538 08/22/22 0944           Data Reviewed:  I have personally reviewed the following...  CBC: Recent Labs  Lab 08/21/22 1132 08/22/22 0729 08/23/22 0402  WBC 2.0* 2.2* 1.9*  NEUTROABS 0.9*  --   --   HGB 13.1 10.8* 11.2*  HCT 41.9 33.6* 33.8*  MCV 101.5* 101.8* 98.8  PLT 76* 111*  51*   Basic Metabolic Panel: Recent Labs  Lab 08/21/22 1132 08/22/22 0750 08/23/22 0402  NA 137 139 136  K 3.7 3.3* 3.2*  CL 103 109 106  CO2 23 21* 21*  GLUCOSE 102* 61* 87  BUN 17 13 11   CREATININE 1.30* 1.01* 1.01*  CALCIUM 8.0* 7.3* 7.8*   GFR: Estimated Creatinine Clearance: 56.9 mL/min (A) (by C-G formula based on SCr of 1.01 mg/dL (H)). Liver Function Tests: Recent Labs  Lab 08/21/22 1132 08/22/22 0750 08/23/22 0402  AST 102* 77* 77*  ALT 38 31 29  ALKPHOS 80 71 64  BILITOT 1.2 1.6* 1.9*  PROT 7.9 6.8 6.2*  ALBUMIN 3.1* 2.6* 2.3*   No results for input(s): "LIPASE", "AMYLASE" in the last 168 hours. Recent Labs  Lab 08/21/22 1220  AMMONIA 19   Coagulation Profile: No results for input(s): "INR", "PROTIME" in the last 168 hours. Cardiac Enzymes: No results for input(s): "CKTOTAL", "CKMB", "CKMBINDEX", "TROPONINI" in the last 168 hours. BNP (last 3 results) No results for input(s): "PROBNP" in the last 8760 hours. HbA1C: No results for input(s): "HGBA1C" in the last 72 hours. CBG: Recent Labs  Lab 08/22/22 1127 08/22/22 1557 08/22/22 2100 08/23/22 0817 08/23/22 1139  GLUCAP 125* 119* 124* 60* 168*   Lipid Profile: No results for input(s): "CHOL", "HDL", "LDLCALC", "TRIG", "CHOLHDL", "LDLDIRECT" in the last 72 hours. Thyroid Function Tests: No results for input(s): "TSH", "T4TOTAL", "FREET4", "T3FREE", "THYROIDAB" in the last 72 hours. Anemia Panel: No results for input(s): "VITAMINB12", "FOLATE", "FERRITIN", "TIBC", "IRON", "RETICCTPCT" in the last 72 hours. Most Recent Urinalysis On File:     Component Value Date/Time   COLORURINE YELLOW (A) 08/21/2022 2022   APPEARANCEUR HAZY (A) 08/21/2022 2022   APPEARANCEUR Hazy 10/11/2013 1705   LABSPEC 1.014 08/21/2022 2022   LABSPEC 1.012 10/11/2013 1705   PHURINE 5.0 08/21/2022 2022   GLUCOSEU NEGATIVE 08/21/2022 2022   GLUCOSEU >=500 10/11/2013 1705   HGBUR SMALL (A) 08/21/2022 2022   BILIRUBINUR  NEGATIVE 08/21/2022 2022   BILIRUBINUR Negative 10/11/2013 1705   KETONESUR NEGATIVE 08/21/2022 2022   PROTEINUR 30 (A) 08/21/2022 2022   NITRITE NEGATIVE 08/21/2022 2022  LEUKOCYTESUR NEGATIVE 08/21/2022 2022   LEUKOCYTESUR 1+ 10/11/2013 1705   Sepsis Labs: @LABRCNTIP (procalcitonin:4,lacticidven:4) Microbiology: Recent Results (from the past 240 hour(s))  Respiratory (~20 pathogens) panel by PCR     Status: None   Collection Time: 08/21/22  2:19 PM   Specimen: Respiratory  Result Value Ref Range Status   Adenovirus NOT DETECTED NOT DETECTED Final   Coronavirus 229E NOT DETECTED NOT DETECTED Final    Comment: (NOTE) The Coronavirus on the Respiratory Panel, DOES NOT test for the novel  Coronavirus (2019 nCoV)    Coronavirus HKU1 NOT DETECTED NOT DETECTED Final   Coronavirus NL63 NOT DETECTED NOT DETECTED Final   Coronavirus OC43 NOT DETECTED NOT DETECTED Final   Metapneumovirus NOT DETECTED NOT DETECTED Final   Rhinovirus / Enterovirus NOT DETECTED NOT DETECTED Final   Influenza A NOT DETECTED NOT DETECTED Final   Influenza B NOT DETECTED NOT DETECTED Final   Parainfluenza Virus 1 NOT DETECTED NOT DETECTED Final   Parainfluenza Virus 2 NOT DETECTED NOT DETECTED Final   Parainfluenza Virus 3 NOT DETECTED NOT DETECTED Final   Parainfluenza Virus 4 NOT DETECTED NOT DETECTED Final   Respiratory Syncytial Virus NOT DETECTED NOT DETECTED Final   Bordetella pertussis NOT DETECTED NOT DETECTED Final   Bordetella Parapertussis NOT DETECTED NOT DETECTED Final   Chlamydophila pneumoniae NOT DETECTED NOT DETECTED Final   Mycoplasma pneumoniae NOT DETECTED NOT DETECTED Final    Comment: Performed at [facility], [address], [place], [place] [address]  SARS Coronavirus 2 by RT PCR (hospital order, performed in [facility] hospital lab) *cepheid single result test*     Status: None   Collection Time: 08/21/22  2:19 PM   Specimen: Nasal Swab  Result Value Ref Range Status    SARS Coronavirus 2 by RT PCR NEGATIVE NEGATIVE Final    Comment: (NOTE) SARS-CoV-2 target nucleic acids are NOT DETECTED.  The SARS-CoV-2 RNA is generally detectable in upper and lower respiratory specimens during the acute phase of infection. The lowest concentration of SARS-CoV-2 viral copies this assay can detect is 250 copies / mL. A negative result does not preclude SARS-CoV-2 infection and should not be used as the sole basis for treatment or other patient management decisions.  A negative result may occur with improper specimen collection / handling, submission of specimen other than nasopharyngeal swab, presence of viral mutation(s) within the areas targeted by this assay, and inadequate number of viral copies (<250 copies / mL). A negative result must be combined with clinical observations, patient history, and epidemiological information.  Fact Sheet for Patients:   RoadLapTop.co.za  Fact Sheet for Healthcare Providers: http://kim-miller.com/  This test is not yet approved or  cleared by the [place] FDA and has been authorized for detection and/or diagnosis of SARS-CoV-2 by FDA under an Emergency Use Authorization (EUA).  This EUA will remain in effect (meaning this test can be used) for the duration of the COVID-19 declaration under Section 564(b)(1) of the Act, 21 U.S.C. section 360bbb-3(b)(1), unless the authorization is terminated or revoked sooner.  Performed at [facility], [address] [place], [place] [address]   Culture, blood (Routine X 2) w Reflex to ID Panel     Status: None (Preliminary result)   Collection Time: 08/21/22  2:19 PM   Specimen: BLOOD  Result Value Ref Range Status   Specimen Description BLOOD BLOOD RIGHT HAND  Final   Special Requests   Final    BOTTLES DRAWN AEROBIC  AND ANAEROBIC Blood Culture adequate volume   Culture   Final    NO GROWTH 2 DAYS Performed at [facility], [address] [place], [place] [address]    Report Status PENDING  Incomplete  Culture, blood (Routine X 2) w Reflex to ID Panel     Status: None (Preliminary result)   Collection Time: 08/21/22  2:19 PM   Specimen: BLOOD  Result Value Ref Range Status   Specimen Description BLOOD LEFT [facility]  Final   Special Requests   Final    BOTTLES DRAWN AEROBIC AND ANAEROBIC Blood Culture adequate volume   Culture   Final    NO GROWTH 2 DAYS Performed at [facility], [address] [place], [place] [address]    Report Status PENDING  Incomplete      Radiology Studies last 3 days: ECHOCARDIOGRAM COMPLETE  Result Date: 08/22/2022    ECHOCARDIOGRAM REPORT   Patient Name:   Adriana Miller Date of Exam: 08/22/2022 Medical Rec #:  [id number]        Height:       66.0 in Accession #:    [id number]       Weight:       176.4 lb Date of Birth:  04-03-1956        BSA:          1.896 m Patient Age:    66 years         BP:           142/87 mmHg Patient Gender: F                HR:           92 bpm. Exam Location:  [facility] Procedure: 2D Echo, Cardiac Doppler, Color Doppler and Intracardiac            Opacification Agent Indications:    A-fib; Elevated troponin  History:        Patient has prior history of Echocardiogram examinations.                 Arrythmias:Atrial Fibrillation, Signs/Symptoms:Shortness of                 Breath; Risk Factors:Diabetes, Hypertension, Non-Smoker and                 Dyslipidemia.  Sonographer:    Dondra Prader RVT RCS Referring Phys: [id number] Sunnie Nielsen  Sonographer Comments: Technically challenging due to patient supine position and limited/poor windows. IMPRESSIONS  1. Left ventricular ejection fraction, by estimation, is 55 to 60%. The left ventricle has normal function. The left ventricle has no regional wall motion abnormalities. Left ventricular diastolic parameters are indeterminate.  2. Right ventricular systolic function is normal. The right ventricular  size is normal.  3. Left atrial size was mild to moderately dilated.  4. The mitral valve is normal in structure. No evidence of mitral valve regurgitation.  5. The aortic valve is bicuspid. Aortic valve regurgitation is not visualized. FINDINGS  Left Ventricle: Left ventricular ejection fraction, by estimation, is 55 to 60%. The left ventricle has normal function. The left ventricle has no regional wall motion abnormalities. Definity contrast agent was given IV to delineate the left ventricular  endocardial borders. The left ventricular internal cavity size was normal in size. There is no left ventricular hypertrophy. Left ventricular diastolic parameters are indeterminate. Right Ventricle: The right ventricular size is normal. No increase in right ventricular wall thickness. Right  ventricular systolic function is normal. Left Atrium: Left atrial size was mild to moderately dilated. Right Atrium: Right atrial size was normal in size. Pericardium: There is no evidence of pericardial effusion. Mitral Valve: The mitral valve is normal in structure. No evidence of mitral valve regurgitation. Tricuspid Valve: The tricuspid valve is normal in structure. Tricuspid valve regurgitation is not demonstrated. Aortic Valve: The aortic valve is bicuspid. Aortic valve regurgitation is not visualized. Aortic valve mean gradient measures 2.0 mmHg. Aortic valve peak gradient measures 2.8 mmHg. Aortic valve area, by VTI measures 1.82 cm. Pulmonic Valve: The pulmonic valve was not well visualized. Pulmonic valve regurgitation is not visualized. Aorta: The aortic root and ascending aorta are structurally normal, with no evidence of dilitation. Venous: The inferior vena cava was not well visualized. IAS/Shunts: No atrial level shunt detected by color flow Doppler.  LEFT VENTRICLE PLAX 2D LVIDd:         2.80 cm   Diastology LVIDs:         2.20 cm   LV e' medial:    10.00 cm/s LV PW:         0.90 cm   LV E/e' medial:  10.7 LV IVS:         1.10 cm   LV e' lateral:   10.70 cm/s LVOT diam:     1.90 cm   LV E/e' lateral: 10.0 LV SV:         29 LV SV Index:   15 LVOT Area:     2.84 cm  RIGHT VENTRICLE RV Basal diam:  2.60 cm RV S prime:     7.13 cm/s LEFT ATRIUM             Index        RIGHT ATRIUM           Index LA diam:        4.00 cm 2.11 cm/m   RA Area:     10.40 cm LA Vol (A2C):   96.2 ml 50.75 ml/m  RA Volume:   23.00 ml  12.13 ml/m LA Vol (A4C):   59.2 ml 31.20 ml/m LA Biplane Vol: 86.8 ml 45.79 ml/m  AORTIC VALVE                    PULMONIC VALVE AV Area (Vmax):    2.59 cm     PV Vmax:       0.70 m/s AV Area (Vmean):   2.54 cm     PV Peak grad:  1.9 mmHg AV Area (VTI):     1.82 cm AV Vmax:           84.20 cm/s AV Vmean:          56.700 cm/s AV VTI:            0.157 m AV Peak Grad:      2.8 mmHg AV Mean Grad:      2.0 mmHg LVOT Vmax:         77.00 cm/s LVOT Vmean:        50.800 cm/s LVOT VTI:          0.101 m LVOT/AV VTI ratio: 0.64  AORTA Ao Root diam: 2.80 cm Ao Asc diam:  3.50 cm MITRAL VALVE MV Area (PHT): 4.24 cm     SHUNTS MV Decel Time: 179 msec     Systemic VTI:  0.10 m MV E velocity: 107.00 cm/s  Systemic Diam: 1.90 cm MV  A velocity: 42.70 cm/s MV E/A ratio:  2.51 Debbe Odea MD Electronically signed by Debbe Odea MD Signature Date/Time: 08/22/2022/4:16:14 PM    Final    DG Chest Port 1 View  Result Date: 08/21/2022 CLINICAL DATA:  Short of breath for 2 days. EXAM: PORTABLE CHEST 1 VIEW COMPARISON:  06/17/2022. FINDINGS: Cardiac silhouette is normal in size. No mediastinal or hilar masses. Clear lungs.  No pleural effusion or pneumothorax. Skeletal structures are grossly intact. IMPRESSION: No active disease. Electronically Signed   By: Amie Portland M.D.   On: 08/21/2022 11:44             LOS: 2 days       Sunnie Nielsen, DO [facility] 08/23/2022, 2:12 PM    Dictation software may have been used to generate the above note. Typos may occur and escape review in typed/dictated  notes. Please contact Dr Lyn Hollingshead directly for clarity if needed.  Staff may message me via secure chat in Epic  but this may not receive an immediate response,  please page me for urgent matters!  If 7PM-7AM, please contact night coverage [email or web address]

## 2022-08-24 ENCOUNTER — Ambulatory Visit: Payer: 59 | Admitting: Podiatry

## 2022-08-24 DIAGNOSIS — I4891 Unspecified atrial fibrillation: Secondary | ICD-10-CM | POA: Diagnosis not present

## 2022-08-24 LAB — BASIC METABOLIC PANEL
Anion gap: 6 (ref 5–15)
BUN: 9 mg/dL (ref 8–23)
CO2: 24 mmol/L (ref 22–32)
Calcium: 8.1 mg/dL — ABNORMAL LOW (ref 8.9–10.3)
Chloride: 109 mmol/L (ref 98–111)
Creatinine, Ser: 0.95 mg/dL (ref 0.44–1.00)
GFR, Estimated: >60 mL/min (ref >60)
Glucose, Bld: 130 mg/dL — ABNORMAL HIGH (ref 70–99)
Potassium: 3.3 mmol/L — ABNORMAL LOW (ref 3.5–5.1)
Sodium: 139 mmol/L (ref 135–145)

## 2022-08-24 LAB — CBC WITH DIFFERENTIAL/PLATELET
Abs Immature Granulocytes: 0.01 10*3/uL (ref 0.00–0.07)
Basophils Absolute: 0 10*3/uL (ref 0.0–0.1)
Basophils Relative: 0 %
Eosinophils Absolute: 0 10*3/uL (ref 0.0–0.5)
Eosinophils Relative: 0 %
HCT: 33.2 % — ABNORMAL LOW (ref 36.0–46.0)
Hemoglobin: 10.9 g/dL — ABNORMAL LOW (ref 12.0–15.0)
Immature Granulocytes: 0 %
Lymphocytes Relative: 42 %
Lymphs Abs: 1 10*3/uL (ref 0.7–4.0)
MCH: 32.8 pg (ref 26.0–34.0)
MCHC: 32.8 g/dL (ref 30.0–36.0)
MCV: 100 fL (ref 80.0–100.0)
Monocytes Absolute: 0.2 10*3/uL (ref 0.1–1.0)
Monocytes Relative: 10 %
Neutro Abs: 1.1 10*3/uL — ABNORMAL LOW (ref 1.7–7.7)
Neutrophils Relative %: 48 %
Platelets: 61 10*3/uL — ABNORMAL LOW (ref 150–400)
RBC: 3.32 MIL/uL — ABNORMAL LOW (ref 3.87–5.11)
RDW: 13.6 % (ref 11.5–15.5)
WBC: 2.3 10*3/uL — ABNORMAL LOW (ref 4.0–10.5)
nRBC: 0 % (ref 0.0–0.2)

## 2022-08-24 LAB — GLUCOSE, CAPILLARY
Glucose-Capillary: 108 mg/dL — ABNORMAL HIGH (ref 70–99)
Glucose-Capillary: 108 mg/dL — ABNORMAL HIGH (ref 70–99)

## 2022-08-24 LAB — CULTURE, BLOOD (ROUTINE X 2)

## 2022-08-24 MED ORDER — POTASSIUM CHLORIDE 10 MEQ/100ML IV SOLN
10.0000 meq | INTRAVENOUS | Status: AC
  Administered 2022-08-24 (×3): 10 meq via INTRAVENOUS
  Filled 2022-08-24 (×3): qty 100

## 2022-08-24 MED ORDER — METOPROLOL TARTRATE 25 MG PO TABS: 25.0000 mg | ORAL_TABLET | Freq: Two times a day (BID) | ORAL | 0 refills | Status: DC

## 2022-08-24 NOTE — Progress Notes (Signed)
Mobility Specialist - Progress Note   08/24/22 0900  Mobility  Activity Ambulated with assistance in room;Transferred from bed to chair  Level of Assistance Standby assist, set-up cues, supervision of patient - no hands on  Assistive Device None  Distance Ambulated (ft) 15 ft  Activity Response Tolerated well  $Mobility charge 1 Mobility  Mobility Specialist Start Time (ACUTE ONLY) 0909  Mobility Specialist Stop Time (ACUTE ONLY) 0915  Mobility Specialist Time Calculation (min) (ACUTE ONLY) 6 min     Pt sitting EOB on arrival, utilizing RA. Pt STS with minA and ambulated to chair with minG. No LOB, but does require light HHA. Pt left in chair with alarm set, needs in reach.    Filiberto Pinks Mobility Specialist 08/24/22, 9:22 AM

## 2022-08-24 NOTE — TOC Transition Note (Addendum)
Transition of Care [facility]) - CM/SW Discharge Note   Patient Details  Name: Adriana Miller MRN: [id number] Date of Birth: 08-05-1955  Transition of Care [facility]) CM/SW Contact:  Truddie Hidden, RN Phone Number: 08/24/2022, 3:24 PM   Clinical Narrative:    Spoke with patient regarding her discharge home today. Her brother will transport her home.  She is agreeable to [facility] PT per therapy recommendation. She does not have a choice of HH. She stated she has a RW and cane at home.Patient advised HH will reach out to her to schedule SOC.   Referral sent and accepted [facility] from [place] and [place] [facility] [place]. HH aide can not be accomodated by either HH  Per Brandi at [facility] patient is already active for PT and RN. Brandi advised HHA and OT needs to be added.   TOC signing off.          Patient Goals and CMS Choice      Discharge Placement                         Discharge Plan and Services Additional resources added to the After Visit Summary for                                       Social Determinants of Health (SDOH) Interventions SDOH Screenings   Food Insecurity: No Food Insecurity (08/22/2022)  Housing: Low Risk  (08/22/2022)  Recent Concern: Housing - Medium Risk (06/18/2022)  Transportation Needs: No Transportation Needs (08/22/2022)  Utilities: Not At Risk (08/22/2022)  Alcohol Screen: Low Risk  (05/13/2022)  Depression (PHQ2-9): Low Risk  (05/13/2022)  Tobacco Use: High Risk (08/21/2022)     Readmission Risk Interventions     No data to display

## 2022-08-24 NOTE — Discharge Summary (Signed)
Physician Discharge Summary   Patient: Adriana Miller MRN: [id number]  DOB: January 08, 1956   Admit:     Date of Admission: 08/21/2022 Admitted from: home   Discharge: Date of discharge: 08/24/22 Disposition: Home w/ home health Condition at discharge: good  CODE STATUS: DNR     Discharge Physician: Sunnie Nielsen, DO [facility]     PCP: Carlean Jews, PA-C  Recommendations for Outpatient Follow-up:  Follow up with PCP McDonough, Salomon Fick, PA-C in 1-2 weeks Please obtain labs/tests: consider BMP, CBC to follow AKI, leukopenia Please follow up on the following pending results: mpme PCP AND OTHER OUTPATIENT PROVIDERS: SEE BELOW FOR SPECIFIC DISCHARGE INSTRUCTIONS PRINTED FOR PATIENT IN ADDITION TO GENERIC AVS PATIENT INFO    Discharge Instructions     Diet - low sodium heart healthy   Complete by: As directed    Diet Carb Modified   Complete by: As directed    Increase activity slowly   Complete by: As directed          Discharge Diagnoses: Principal Problem:   Atrial fibrillation with RVR (HCC) Active Problems:   SIRS due to infectious process with acute organ dysfunction (HCC)   AKI (acute kidney injury) (HCC)   Alcohol abuse with other alcohol-induced disorder (HCC)   Metabolic encephalopathy   Alcoholic cirrhosis of liver without ascites (HCC)   Type 2 diabetes mellitus (HCC)   Chronic heart failure with preserved ejection fraction (HFpEF) (HCC)   Hepatocellular carcinoma (HCC)   Mixed hyperlipidemia   Essential hypertension   Hepatic cirrhosis (HCC)   Paroxysmal atrial fibrillation (HCC)   Elevated LFTs   Thrombocytopenia (HCC)   History of cerebral aneurysm repair   Elevated troponin   Sepsis [facility])       Hospital Course: [name] is a 67 year old female with history of paroxysmal atrial fibrillation on Eliquis, liver cirrhosis secondary to alcohol use, no history of ascites, alcohol abuse, insulin-dependent  diabetes mellitus, hepatocellular carcinoma, hypertension, heart failure with preserved ejection fraction, who presents to emergency department for chief concerns of intractable nausea and vomiting upon waking up in the morning, unable to move out of her chair. Marland Kitchen 05/04: admitted for Afib RVR. CXR nonacute. WBC low, path pending. UA no UTI. COVID and other respiratory PCR negative. Tx w/ Ondansetron 4 mg IV one-time dose, diltiazem gtt., sodium chloride 500 mL bolus.  05/05: rate controlled off dilt drip. Pt reports improvement overall but still very weak. PT/OT to see. She lives alone.  05/06: stopping abx, BCx NGx2d. PT/OT recs for SNF, TOC consulted  05/07: initial recs for SNF d/c dt improvement in ambulation, still recommending home health. OK for d/c home on increased dose beta blocker compared to previous, continue EtOH abstinence, follow w/ PCP and cardiology outpatient    Consultants:  none  Procedures: none      ASSESSMENT & PLAN:   SIRS due to AFib RVR R/o sepsis w/ infectious process with acute organ dysfunction (HCC) No identified source of infection at this time, patient  had elevated HR and leukopenia, with acute kidney injury and atrial fibrillation with RVR. Lactate elevated 4.7. Procalcitonin neg.  BCx NG x3d Afib as below   Atrial fibrillation with RVR (HCC) --> rate controlled Etiology workup in progress Question meeting SIRS criteria with leukocytopenia No source of infection identified at this time Eliquis Metoprolol increased   AKI (acute kidney injury) on CKD3a (HCC) - resolved Multifactorial, in setting of prerenal in setting of  GI loss and possible complication from SIRS/sepsis S/p LR 1 L bolus ordered on admission - stopped IV fluids.  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  Chronic heart failure with preserved ejection fraction (HFpEF) (HCC) Appears compensated, not in acute exacerbation at this time Home meds per med rec   Alcoholic cirrhosis of liver without ascites (HCC) Counseling given for alcohol cessation given extensively at bedside Patient states "I am quitting today girlfriend "  Metabolic encephalopathy- improving Patient was able to tell me her name, age, location, current calendar year however patient not a good historian not able to tell me why she is in the hospital Intermittently she states she feels better with fluid and can dance, and telling me that she was vomiting a lot and feeling terrible  Alcohol abuse with other alcohol-induced disorder (HCC) Encouraged abstinence   Elevated troponin Suspect demand ischemia secondary to severe sepsis No clinical suspicion for acs at this time               Discharge Instructions  Allergies as of 08/24/2022       Reactions   Aspirin Other (See Comments)   Nose bleed        Medication List     STOP taking these medications    erythromycin ophthalmic ointment       TAKE these medications    Accu-Chek Guide Me w/Device Kit Use as directed  dx E11.65   Accu-Chek Guide test strip Generic drug: glucose blood Use as instructed twice a day DX E11.65   acetaminophen 325 MG tablet Commonly known as: TYLENOL Take 2 tablets (650 mg total) by mouth every 6 (six) hours as needed for mild pain (or Fever >/= 101).   allopurinol 100 MG tablet Commonly known as: ZYLOPRIM TAKE 1 TABLET BY MOUTH DAILY   apixaban 5 MG Tabs tablet Commonly known as: Eliquis Take 1 tablet (5 mg total) by mouth 2 (two) times daily.   B-D SINGLE USE SWABS  REGULAR Pads USE AS DIRECTED TWICE DAILY   BD Pen Needle Nano 2nd Gen 32G X 4 MM Misc Generic drug: Insulin Pen Needle USE AS DIRECTED WITH TOUJEO   Calcium Carb-Cholecalciferol 500-5 MG-MCG Tabs Commonly known as: Calcium 500/D Take 1 tab po daily   Dexcom G7 Receiver Devi Use for continuous blood glucose monitoring  DX E11.65   Dexcom G7 Sensor Misc Use as directed for continuous glucose monitoring.  Change sensor every 10 days   E11.65   ergocalciferol 1.25 MG (50000 UT) capsule Commonly known as: VITAMIN D2 Take 1 capsule (50,000 Units total) by mouth once a week.   escitalopram 10 MG tablet Commonly known as: LEXAPRO TAKE 1 TABLET BY MOUTH DAILY FOR ANXIETY/SLEEP   folic acid 1 MG tablet Commonly known as: FOLVITE Take 1 tablet (1 mg total) by mouth daily.   furosemide 20 MG tablet Commonly known as: LASIX TAKE 1 TABLET (20 MG TOTAL) BY MOUTH DAILY.   glimepiride 1 MG tablet Commonly known as: AMARYL TAKE 1 TABLET BY MOUTH EVERY DAY WITH BREAKFAST   metoprolol tartrate 25 MG tablet Commonly known as: LOPRESSOR Take 1  tablet (25 mg total) by mouth 2 (two) times daily. TAKE 1/2 TABLET BY MOUTH TWICE A DAY What changed:  how much to take how to take this when to take this   mirtazapine 7.5 MG tablet Commonly known as: REMERON TAKE 1 TABLET BY MOUTH DAILY AT  BEDTIME   potassium chloride 8 MEQ tablet Commonly known as: KLOR-CON Take 1 tablet (8 mEq total) by mouth 3 (three) times a week. Monday, Wednesday, Friday   Safety Lancets 28G Misc Use as directed once a daily Dx e11.65   spironolactone 25 MG tablet Commonly known as: ALDACTONE TAKE 1 TABLET BY MOUTH DAILY FOR CIRRHOSIS OF LIVER   thiamine 100 MG tablet Commonly known as: Vitamin B-1 Take 1 tablet (100 mg total) by mouth daily.   Toujeo SoloStar 300 UNIT/ML Solostar Pen Generic drug: insulin glargine (1 Unit Dial) Inject 28 Units into the skin daily with supper.   traZODone 50 MG  tablet Commonly known as: DESYREL TAKE 1 TO 2 TABLETS BY MOUTH AT  BEDTIME FOR INSOMNIA What changed:  how much to take how to take this when to take this additional instructions          Allergies  Allergen Reactions   Aspirin Other (See Comments)    Nose bleed     Subjective: pt feeling well today, walking better w/ PT, feeling stronger today    Discharge Exam: BP (!) 160/94 (BP Location: Left Arm)   Pulse 98   Temp 97.7 F (36.5 C) (Oral)   Resp 18   Ht 5\' 6"  (1.676 m)   Wt 77.6 kg   SpO2 100%   BMI 27.60 kg/m  General: Pt is alert, awake, not in acute distress Cardiovascular: RRR, S1/S2, no rubs, no gallops Respiratory: CTA bilaterally, no wheezing, no rhonchi Abdominal: Soft, NT, ND, bowel sounds +WNL Extremities: no edema, no cyanosis     The results of significant diagnostics from this hospitalization (including imaging, microbiology, ancillary and laboratory) are listed below for reference.     Microbiology: Recent Results (from the past 240 hour(s))  Respiratory (~20 pathogens) panel by PCR     Status: None   Collection Time: 08/21/22  2:19 PM   Specimen: Respiratory  Result Value Ref Range Status   Adenovirus NOT DETECTED NOT DETECTED Final   Coronavirus 229E NOT DETECTED NOT DETECTED Final    Comment: (NOTE) The Coronavirus on the Respiratory Panel, DOES NOT test for the novel  Coronavirus (2019 nCoV)    Coronavirus HKU1 NOT DETECTED NOT DETECTED Final   Coronavirus NL63 NOT DETECTED NOT DETECTED Final   Coronavirus OC43 NOT DETECTED NOT DETECTED Final   Metapneumovirus NOT DETECTED NOT DETECTED Final   Rhinovirus / Enterovirus NOT DETECTED NOT DETECTED Final   Influenza A NOT DETECTED NOT DETECTED Final   Influenza B NOT DETECTED NOT DETECTED Final   Parainfluenza Virus 1 NOT DETECTED NOT DETECTED Final   Parainfluenza Virus 2 NOT DETECTED NOT DETECTED Final   Parainfluenza Virus 3 NOT DETECTED NOT DETECTED Final   Parainfluenza Virus  4 NOT DETECTED NOT DETECTED Final   Respiratory Syncytial Virus NOT DETECTED NOT DETECTED Final   Bordetella pertussis NOT DETECTED NOT DETECTED Final   Bordetella Parapertussis NOT DETECTED NOT DETECTED Final   Chlamydophila pneumoniae NOT DETECTED NOT DETECTED Final   Mycoplasma pneumoniae NOT DETECTED NOT DETECTED Final    Comment: Performed at [facility], [address] [place], [place] [address]  SARS Coronavirus 2 by RT  PCR (hospital order, performed in [facility] hospital lab) *cepheid single result test*     Status: None   Collection Time: 08/21/22  2:19 PM   Specimen: Nasal Swab  Result Value Ref Range Status   SARS Coronavirus 2 by RT PCR NEGATIVE NEGATIVE Final    Comment: (NOTE) SARS-CoV-2 target nucleic acids are NOT DETECTED.  The SARS-CoV-2 RNA is generally detectable in upper and lower respiratory specimens during the acute phase of infection. The lowest concentration of SARS-CoV-2 viral copies this assay can detect is 250 copies / mL. A negative result does not preclude SARS-CoV-2 infection and should not be used as the sole basis for treatment or other patient management decisions.  A negative result may occur with improper specimen collection / handling, submission of specimen other than nasopharyngeal swab, presence of viral mutation(s) within the areas targeted by this assay, and inadequate number of viral copies (<250 copies / mL). A negative result must be combined with clinical observations, patient history, and epidemiological information.  Fact Sheet for Patients:   RoadLapTop.co.za  Fact Sheet for Healthcare Providers: http://kim-miller.com/  This test is not yet approved or  cleared by the [place] FDA and has been authorized for detection and/or diagnosis of SARS-CoV-2 by FDA under an Emergency Use Authorization (EUA).  This EUA will remain in effect (meaning this test can be used) for the  duration of the COVID-19 declaration under Section 564(b)(1) of the Act, 21 U.S.C. section 360bbb-3(b)(1), unless the authorization is terminated or revoked sooner.  Performed at [facility], [address] [place], [place] [address]   Culture, blood (Routine X 2) w Reflex to ID Panel     Status: None (Preliminary result)   Collection Time: 08/21/22  2:19 PM   Specimen: BLOOD  Result Value Ref Range Status   Specimen Description BLOOD BLOOD RIGHT HAND  Final   Special Requests   Final    BOTTLES DRAWN AEROBIC AND ANAEROBIC Blood Culture adequate volume   Culture   Final    NO GROWTH 3 DAYS Performed at [facility], [address], [place], [place] [address]    Report Status PENDING  Incomplete  Culture, blood (Routine X 2) w Reflex to ID Panel     Status: None (Preliminary result)   Collection Time: 08/21/22  2:19 PM   Specimen: BLOOD  Result Value Ref Range Status   Specimen Description BLOOD LEFT [facility]  Final   Special Requests   Final    BOTTLES DRAWN AEROBIC AND ANAEROBIC Blood Culture adequate volume   Culture   Final    NO GROWTH 3 DAYS Performed at [facility], [address], [place], [place] [address]    Report Status PENDING  Incomplete     Labs: BNP (last 3 results) No results for input(s): "BNP" in the last 8760 hours. Basic Metabolic Panel: Recent Labs  Lab 08/21/22 1132 08/22/22 0750 08/23/22 0402 08/24/22 0616  NA 137 139 136 139  K 3.7 3.3* 3.2* 3.3*  CL 103 109 106 109  CO2 23 21* 21* 24  GLUCOSE 102* 61* 87 130*  BUN 17 13 11 9   CREATININE 1.30* 1.01* 1.01* 0.95  CALCIUM 8.0* 7.3* 7.8* 8.1*   Liver Function Tests: Recent Labs  Lab 08/21/22 1132 08/22/22 0750 08/23/22 0402  AST 102* 77* 77*  ALT 38 31 29  ALKPHOS 80 71 64  BILITOT 1.2 1.6* 1.9*  PROT 7.9 6.8 6.2*  ALBUMIN 3.1* 2.6* 2.3*  No results for input(s): "LIPASE", "AMYLASE" in the last 168 hours. Recent Labs  Lab 08/21/22 1220  AMMONIA 19    CBC: Recent Labs  Lab 08/21/22 1132 08/22/22 0729 08/23/22 0402 08/24/22 0616  WBC 2.0* 2.2* 1.9* 2.3*  NEUTROABS 0.9*  --   --  1.1*  HGB 13.1 10.8* 11.2* 10.9*  HCT 41.9 33.6* 33.8* 33.2*  MCV 101.5* 101.8* 98.8 100.0  PLT 76* 111* 51* 61*   Cardiac Enzymes: No results for input(s): "CKTOTAL", "CKMB", "CKMBINDEX", "TROPONINI" in the last 168 hours. BNP: Invalid input(s): "POCBNP" CBG: Recent Labs  Lab 08/23/22 1139 08/23/22 1606 08/23/22 2056 08/24/22 0805 08/24/22 1222  GLUCAP 168* 133* 135* 108* 108*   D-Dimer No results for input(s): "DDIMER" in the last 72 hours. Hgb A1c No results for input(s): "HGBA1C" in the last 72 hours. Lipid Profile No results for input(s): "CHOL", "HDL", "LDLCALC", "TRIG", "CHOLHDL", "LDLDIRECT" in the last 72 hours. Thyroid function studies No results for input(s): "TSH", "T4TOTAL", "T3FREE", "THYROIDAB" in the last 72 hours.  Invalid input(s): "FREET3" Anemia work up No results for input(s): "VITAMINB12", "FOLATE", "FERRITIN", "TIBC", "IRON", "RETICCTPCT" in the last 72 hours. Urinalysis    Component Value Date/Time   COLORURINE YELLOW (A) 08/21/2022 2022   APPEARANCEUR HAZY (A) 08/21/2022 2022   APPEARANCEUR Hazy 10/11/2013 1705   LABSPEC 1.014 08/21/2022 2022   LABSPEC 1.012 10/11/2013 1705   PHURINE 5.0 08/21/2022 2022   GLUCOSEU NEGATIVE 08/21/2022 2022   GLUCOSEU >=500 10/11/2013 1705   HGBUR SMALL (A) 08/21/2022 2022   BILIRUBINUR NEGATIVE 08/21/2022 2022   BILIRUBINUR Negative 10/11/2013 1705   KETONESUR NEGATIVE 08/21/2022 2022   PROTEINUR 30 (A) 08/21/2022 2022   NITRITE NEGATIVE 08/21/2022 2022   LEUKOCYTESUR NEGATIVE 08/21/2022 2022   LEUKOCYTESUR 1+ 10/11/2013 1705   Sepsis Labs Recent Labs  Lab 08/21/22 1132 08/22/22 0729 08/23/22 0402 08/24/22 0616  WBC 2.0* 2.2* 1.9* 2.3*   Microbiology Recent Results (from the past 240 hour(s))  Respiratory (~20 pathogens) panel by PCR     Status: None    Collection Time: 08/21/22  2:19 PM   Specimen: Respiratory  Result Value Ref Range Status   Adenovirus NOT DETECTED NOT DETECTED Final   Coronavirus 229E NOT DETECTED NOT DETECTED Final    Comment: (NOTE) The Coronavirus on the Respiratory Panel, DOES NOT test for the novel  Coronavirus (2019 nCoV)    Coronavirus HKU1 NOT DETECTED NOT DETECTED Final   Coronavirus NL63 NOT DETECTED NOT DETECTED Final   Coronavirus OC43 NOT DETECTED NOT DETECTED Final   Metapneumovirus NOT DETECTED NOT DETECTED Final   Rhinovirus / Enterovirus NOT DETECTED NOT DETECTED Final   Influenza A NOT DETECTED NOT DETECTED Final   Influenza B NOT DETECTED NOT DETECTED Final   Parainfluenza Virus 1 NOT DETECTED NOT DETECTED Final   Parainfluenza Virus 2 NOT DETECTED NOT DETECTED Final   Parainfluenza Virus 3 NOT DETECTED NOT DETECTED Final   Parainfluenza Virus 4 NOT DETECTED NOT DETECTED Final   Respiratory Syncytial Virus NOT DETECTED NOT DETECTED Final   Bordetella pertussis NOT DETECTED NOT DETECTED Final   Bordetella Parapertussis NOT DETECTED NOT DETECTED Final   Chlamydophila pneumoniae NOT DETECTED NOT DETECTED Final   Mycoplasma pneumoniae NOT DETECTED NOT DETECTED Final    Comment: Performed at [facility], [address], [place], [place] [address]  SARS Coronavirus 2 by RT PCR (hospital order, performed in [facility] hospital lab) *cepheid single result test*     Status: None  Collection Time: 08/21/22  2:19 PM   Specimen: Nasal Swab  Result Value Ref Range Status   SARS Coronavirus 2 by RT PCR NEGATIVE NEGATIVE Final    Comment: (NOTE) SARS-CoV-2 target nucleic acids are NOT DETECTED.  The SARS-CoV-2 RNA is generally detectable in upper and lower respiratory specimens during the acute phase of infection. The lowest concentration of SARS-CoV-2 viral copies this assay can detect is 250 copies / mL. A negative result does not preclude SARS-CoV-2 infection and should not be used as the  sole basis for treatment or other patient management decisions.  A negative result may occur with improper specimen collection / handling, submission of specimen other than nasopharyngeal swab, presence of viral mutation(s) within the areas targeted by this assay, and inadequate number of viral copies (<250 copies / mL). A negative result must be combined with clinical observations, patient history, and epidemiological information.  Fact Sheet for Patients:   RoadLapTop.co.za  Fact Sheet for Healthcare Providers: http://kim-miller.com/  This test is not yet approved or  cleared by the [place] FDA and has been authorized for detection and/or diagnosis of SARS-CoV-2 by FDA under an Emergency Use Authorization (EUA).  This EUA will remain in effect (meaning this test can be used) for the duration of the COVID-19 declaration under Section 564(b)(1) of the Act, 21 U.S.C. section 360bbb-3(b)(1), unless the authorization is terminated or revoked sooner.  Performed at [facility], [address] [place], [place] [address]   Culture, blood (Routine X 2) w Reflex to ID Panel     Status: None (Preliminary result)   Collection Time: 08/21/22  2:19 PM   Specimen: BLOOD  Result Value Ref Range Status   Specimen Description BLOOD BLOOD RIGHT HAND  Final   Special Requests   Final    BOTTLES DRAWN AEROBIC AND ANAEROBIC Blood Culture adequate volume   Culture   Final    NO GROWTH 3 DAYS Performed at [facility], [address], [place], [place] [address]    Report Status PENDING  Incomplete  Culture, blood (Routine X 2) w Reflex to ID Panel     Status: None (Preliminary result)   Collection Time: 08/21/22  2:19 PM   Specimen: BLOOD  Result Value Ref Range Status   Specimen Description BLOOD LEFT [facility]  Final   Special Requests   Final    BOTTLES DRAWN AEROBIC AND ANAEROBIC Blood Culture adequate volume   Culture   Final     NO GROWTH 3 DAYS Performed at [facility], [address], [place], [place] [address]    Report Status PENDING  Incomplete   Imaging ECHOCARDIOGRAM COMPLETE  Result Date: 08/22/2022    ECHOCARDIOGRAM REPORT   Patient Name:   NIYLA ZAMOR Date of Exam: 08/22/2022 Medical Rec #:  [id number]        Height:       66.0 in Accession #:    [id number]       Weight:       176.4 lb Date of Birth:  05-02-1955        BSA:          1.896 m Patient Age:    66 years         BP:           142/87 mmHg Patient Gender: F                HR:  92 bpm. Exam Location:  [facility] Procedure: 2D Echo, Cardiac Doppler, Color Doppler and Intracardiac            Opacification Agent Indications:    A-fib; Elevated troponin  History:        Patient has prior history of Echocardiogram examinations.                 Arrythmias:Atrial Fibrillation, Signs/Symptoms:Shortness of                 Breath; Risk Factors:Diabetes, Hypertension, Non-Smoker and                 Dyslipidemia.  Sonographer:    Dondra Prader RVT RCS Referring Phys: [id number] Sunnie Nielsen  Sonographer Comments: Technically challenging due to patient supine position and limited/poor windows. IMPRESSIONS  1. Left ventricular ejection fraction, by estimation, is 55 to 60%. The left ventricle has normal function. The left ventricle has no regional wall motion abnormalities. Left ventricular diastolic parameters are indeterminate.  2. Right ventricular systolic function is normal. The right ventricular size is normal.  3. Left atrial size was mild to moderately dilated.  4. The mitral valve is normal in structure. No evidence of mitral valve regurgitation.  5. The aortic valve is bicuspid. Aortic valve regurgitation is not visualized. FINDINGS  Left Ventricle: Left ventricular ejection fraction, by estimation, is 55 to 60%. The left ventricle has normal function. The left ventricle has no regional wall motion abnormalities. Definity contrast agent was given IV  to delineate the left ventricular  endocardial borders. The left ventricular internal cavity size was normal in size. There is no left ventricular hypertrophy. Left ventricular diastolic parameters are indeterminate. Right Ventricle: The right ventricular size is normal. No increase in right ventricular wall thickness. Right ventricular systolic function is normal. Left Atrium: Left atrial size was mild to moderately dilated. Right Atrium: Right atrial size was normal in size. Pericardium: There is no evidence of pericardial effusion. Mitral Valve: The mitral valve is normal in structure. No evidence of mitral valve regurgitation. Tricuspid Valve: The tricuspid valve is normal in structure. Tricuspid valve regurgitation is not demonstrated. Aortic Valve: The aortic valve is bicuspid. Aortic valve regurgitation is not visualized. Aortic valve mean gradient measures 2.0 mmHg. Aortic valve peak gradient measures 2.8 mmHg. Aortic valve area, by VTI measures 1.82 cm. Pulmonic Valve: The pulmonic valve was not well visualized. Pulmonic valve regurgitation is not visualized. Aorta: The aortic root and ascending aorta are structurally normal, with no evidence of dilitation. Venous: The inferior vena cava was not well visualized. IAS/Shunts: No atrial level shunt detected by color flow Doppler.  LEFT VENTRICLE PLAX 2D LVIDd:         2.80 cm   Diastology LVIDs:         2.20 cm   LV e' medial:    10.00 cm/s LV PW:         0.90 cm   LV E/e' medial:  10.7 LV IVS:        1.10 cm   LV e' lateral:   10.70 cm/s LVOT diam:     1.90 cm   LV E/e' lateral: 10.0 LV SV:         29 LV SV Index:   15 LVOT Area:     2.84 cm  RIGHT VENTRICLE RV Basal diam:  2.60 cm RV S prime:     7.13 cm/s LEFT ATRIUM  Index        RIGHT ATRIUM           Index LA diam:        4.00 cm 2.11 cm/m   RA Area:     10.40 cm LA Vol (A2C):   96.2 ml 50.75 ml/m  RA Volume:   23.00 ml  12.13 ml/m LA Vol (A4C):   59.2 ml 31.20 ml/m LA Biplane Vol: 86.8  ml 45.79 ml/m  AORTIC VALVE                    PULMONIC VALVE AV Area (Vmax):    2.59 cm     PV Vmax:       0.70 m/s AV Area (Vmean):   2.54 cm     PV Peak grad:  1.9 mmHg AV Area (VTI):     1.82 cm AV Vmax:           84.20 cm/s AV Vmean:          56.700 cm/s AV VTI:            0.157 m AV Peak Grad:      2.8 mmHg AV Mean Grad:      2.0 mmHg LVOT Vmax:         77.00 cm/s LVOT Vmean:        50.800 cm/s LVOT VTI:          0.101 m LVOT/AV VTI ratio: 0.64  AORTA Ao Root diam: 2.80 cm Ao Asc diam:  3.50 cm MITRAL VALVE MV Area (PHT): 4.24 cm     SHUNTS MV Decel Time: 179 msec     Systemic VTI:  0.10 m MV E velocity: 107.00 cm/s  Systemic Diam: 1.90 cm MV A velocity: 42.70 cm/s MV E/A ratio:  2.51 Debbe Odea MD Electronically signed by Debbe Odea MD Signature Date/Time: 08/22/2022/4:16:14 PM    Final       Time coordinating discharge: over 30 minutes  SIGNED:  Sunnie Nielsen DO [facility]

## 2022-08-24 NOTE — Plan of Care (Signed)

## 2022-08-24 NOTE — Care Management Important Message (Signed)
Important Message  Patient Details  Name: Adriana Miller MRN: [id number] Date of Birth: 1955/09/13   Medicare Important Message Given:  Yes     [name] 08/24/2022, 4:03 PM

## 2022-08-24 NOTE — Progress Notes (Signed)
Physical Therapy Treatment Patient Details Name: Adriana Miller MRN: [id number] DOB: 1955-10-10 Today's Date: 08/24/2022   History of Present Illness 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 Vitals monitored and demonstrates improved safety awareness. Pt asked if she could dc home and requesting to reach out to care team. Secure chat sent to care team. Will continue to progress as able.   Recommendations for follow up therapy are one component of a multi-disciplinary discharge planning process, led by the attending physician.  Recommendations may be updated based on patient status, additional functional criteria and insurance authorization.  Follow Up Recommendations  Can patient physically be transported by private vehicle: Yes    Assistance Recommended at Discharge Intermittent Supervision/Assistance  Patient can return home with the following A little help with walking and/or transfers;A little help with bathing/dressing/bathroom;Assistance with cooking/housework;Assist for transportation;Help with stairs or ramp for entrance   Equipment Recommendations  Rolling walker (2 wheels)    Recommendations for Other Services       Precautions / Restrictions Precautions Precautions: Fall Restrictions Weight Bearing Restrictions: No     Mobility  Bed Mobility Overal bed mobility: Needs Assistance Bed Mobility: Supine to Sit     Supine to sit: Supervision, HOB elevated     General bed  mobility comments: safe technique with upright posture noted    Transfers Overall transfer level: Needs assistance Equipment used: Rolling walker (2 wheels) Transfers: Sit to/from Stand Sit to Stand: Supervision           General transfer comment: safe technique with RW    Ambulation/Gait Ambulation/Gait assistance: Min guard Gait Distance (Feet): 200 Feet Assistive device: Rolling walker (2 wheels) Gait Pattern/deviations: Step-through pattern       General Gait Details: ambulated in hallway with HR ranging from 103-150bpm. Pt asympomatic throughout and able to complete ambulation without rest break   Stairs             Wheelchair Mobility    Modified Rankin (Stroke Patients Only)       Balance Overall balance assessment: Needs assistance Sitting-balance support: Single extremity supported, Bilateral upper extremity supported, Feet supported Sitting balance-Leahy Scale: Good     Standing balance support: During functional activity Standing balance-Leahy Scale: Fair                              Cognition Arousal/Alertness: Awake/alert Behavior During Therapy: WFL for tasks assessed/performed Overall Cognitive Status: No family/caregiver present to determine baseline cognitive functioning                                 General Comments: pleasant and demonstrates improved insight into deficits        Exercises Other Exercises Other Exercises: seated ther-ex performed on B LE including hip add squeezes, LAQ, shoulder rolls, and scap squeezes. 10 reps with supervision    General Comments        Pertinent Vitals/Pain Pain Assessment Pain Assessment: No/denies  pain    Home Living                          Prior Function            PT Goals (current goals can now be found in the care plan section) Acute Rehab PT Goals Patient Stated Goal: get better PT Goal Formulation: With patient Time For Goal  Achievement: 09/05/22 Potential to Achieve Goals: Good Progress towards PT goals: Progressing toward goals    Frequency    Min 4X/week      PT Plan Discharge plan needs to be updated    Co-evaluation              AM-PAC PT "6 Clicks" Mobility   Outcome Measure  Help needed turning from your back to your side while in a flat bed without using bedrails?: None Help needed moving from lying on your back to sitting on the side of a flat bed without using bedrails?: None Help needed moving to and from a bed to a chair (including a wheelchair)?: A Little Help needed standing up from a chair using your arms (e.g., wheelchair or bedside chair)?: A Little Help needed to walk in hospital room?: A Little Help needed climbing 3-5 steps with a railing? : A Little 6 Click Score: 20    End of Session Equipment Utilized During Treatment: Gait belt Activity Tolerance: Patient tolerated treatment well;Patient limited by fatigue Patient left: in bed;with bed alarm set (declined recliner this date) Nurse Communication: Mobility status PT Visit Diagnosis: Muscle weakness (generalized) (M62.81);Difficulty in walking, not elsewhere classified (R26.2);History of falling (Z91.81)     Time: 1324-4010 PT Time Calculation (min) (ACUTE ONLY): 19 min  Charges:  $Gait Training: 8-22 mins                     Elizabeth Palau, PT, DPT, GCS [phone]    Adriana Miller 08/24/2022, 2:15 PM

## 2022-08-24 NOTE — Inpatient Diabetes Management (Signed)
Inpatient Diabetes Program Recommendations  AACE/ADA: New Consensus Statement on Inpatient Glycemic Control  Target Ranges:  Prepandial:   less than 140 mg/dL      Peak postprandial:   less than 180 mg/dL (1-2 hours)      Critically ill patients:  140 - 180 mg/dL    Latest Reference Range & Units 08/24/22 06:16  Glucose 70 - 99 mg/dL 409 (H)    Latest Reference Range & Units 08/23/22 08:17 08/23/22 11:39 08/23/22 16:06 08/23/22 20:56  Glucose-Capillary 70 - 99 mg/dL 60 (L) 811 (H) 914 (H) 135 (H)   Review of Glycemic Control  Diabetes history: DM2 Outpatient Diabetes medications: Toujeo 28 units QPM, Amaryl 1 mg QAM Current orders for Inpatient glycemic control: None  Inpatient Diabetes Program Recommendations:    Insulin: Please consider ordering Novolog 0-9 units TID with meals and Novolog 0-5 units QHS.  NOTE: Semglee 28 units given on 08/21/22 and fasting CBG 60 mg/dl on 10/25/27. Noted Semglee and Novolog correction was discontinued on 08/23/22. Recommend adding back Novolog correction scale.  Thanks, Orlando Penner, RN, MSN, CDCES Diabetes Coordinator Inpatient Diabetes Program [phone] (Team Pager from 8am to 5pm)

## 2022-08-24 NOTE — Plan of Care (Signed)
Problem: Education: Goal: Ability to describe self-care measures that may prevent or decrease complications (Diabetes Survival Skills Education) will improve 08/24/2022 1538 by Genia Hotter, RN Outcome: Adequate for Discharge 08/24/2022 0908 by Genia Hotter, RN Outcome: Progressing Goal: Individualized Educational Video(s) 08/24/2022 1538 by Genia Hotter, RN Outcome: Adequate for Discharge 08/24/2022 0908 by Genia Hotter, RN Outcome: Progressing   Problem: Coping: Goal: Ability to adjust to condition or change in health will improve 08/24/2022 1538 by Genia Hotter, RN Outcome: Adequate for Discharge 08/24/2022 0908 by Genia Hotter, RN Outcome: Progressing   Problem: Fluid Volume: Goal: Ability to maintain a balanced intake and output will improve 08/24/2022 1538 by Genia Hotter, RN Outcome: Adequate for Discharge 08/24/2022 0908 by Genia Hotter, RN Outcome: Progressing   Problem: Health Behavior/Discharge Planning: Goal: Ability to identify and utilize available resources and services will improve 08/24/2022 1538 by Genia Hotter, RN Outcome: Adequate for Discharge 08/24/2022 0908 by Genia Hotter, RN Outcome: Progressing Goal: Ability to manage health-related needs will improve 08/24/2022 1538 by Genia Hotter, RN Outcome: Adequate for Discharge 08/24/2022 0908 by Genia Hotter, RN Outcome: Progressing   Problem: Metabolic: Goal: Ability to maintain appropriate glucose levels will improve 08/24/2022 1538 by Genia Hotter, RN Outcome: Adequate for Discharge 08/24/2022 0908 by Genia Hotter, RN Outcome: Progressing   Problem: Nutritional: Goal: Maintenance of adequate nutrition will improve 08/24/2022 1538 by Genia Hotter, RN Outcome: Adequate for Discharge 08/24/2022 0908 by Genia Hotter, RN Outcome: Progressing Goal: Progress toward achieving an optimal weight will improve 08/24/2022 1538 by Genia Hotter, RN Outcome: Adequate for Discharge 08/24/2022 0908 by Genia Hotter,  RN Outcome: Progressing   Problem: Skin Integrity: Goal: Risk for impaired skin integrity will decrease 08/24/2022 1538 by Genia Hotter, RN Outcome: Adequate for Discharge 08/24/2022 0908 by Genia Hotter, RN Outcome: Progressing   Problem: Tissue Perfusion: Goal: Adequacy of tissue perfusion will improve 08/24/2022 1538 by Genia Hotter, RN Outcome: Adequate for Discharge 08/24/2022 0908 by Genia Hotter, RN Outcome: Progressing   Problem: Education: Goal: Knowledge of General Education information will improve Description: Including pain rating scale, medication(s)/side effects and non-pharmacologic comfort measures 08/24/2022 1538 by Genia Hotter, RN Outcome: Adequate for Discharge 08/24/2022 0908 by Genia Hotter, RN Outcome: Progressing   Problem: Health Behavior/Discharge Planning: Goal: Ability to manage health-related needs will improve 08/24/2022 1538 by Genia Hotter, RN Outcome: Adequate for Discharge 08/24/2022 0908 by Genia Hotter, RN Outcome: Progressing   Problem: Clinical Measurements: Goal: Ability to maintain clinical measurements within normal limits will improve 08/24/2022 1538 by Genia Hotter, RN Outcome: Adequate for Discharge 08/24/2022 0908 by Genia Hotter, RN Outcome: Progressing Goal: Will remain free from infection 08/24/2022 1538 by Genia Hotter, RN Outcome: Adequate for Discharge 08/24/2022 0908 by Genia Hotter, RN Outcome: Progressing Goal: Diagnostic test results will improve 08/24/2022 1538 by Genia Hotter, RN Outcome: Adequate for Discharge 08/24/2022 0908 by Genia Hotter, RN Outcome: Progressing Goal: Respiratory complications will improve 08/24/2022 1538 by Genia Hotter, RN Outcome: Adequate for Discharge 08/24/2022 0908 by Genia Hotter, RN Outcome: Progressing Goal: Cardiovascular complication will be avoided 08/24/2022 1538 by Genia Hotter, RN Outcome: Adequate for Discharge 08/24/2022 0908 by Genia Hotter, RN Outcome: Progressing   Problem:  Activity: Goal: Risk for activity intolerance will decrease 08/24/2022 1538 by Genia Hotter, RN Outcome: Adequate for Discharge 08/24/2022 0908 by Genia Hotter, RN Outcome: Progressing   Problem: Nutrition: Goal: Adequate nutrition will be maintained 08/24/2022 1538 by Genia Hotter, RN Outcome: Adequate for Discharge 08/24/2022 0908 by Genia Hotter, RN Outcome: Progressing  Problem: Coping: Goal: Level of anxiety will decrease 08/24/2022 1538 by Genia Hotter, RN Outcome: Adequate for Discharge 08/24/2022 0908 by Genia Hotter, RN Outcome: Progressing   Problem: Elimination: Goal: Will not experience complications related to bowel motility 08/24/2022 1538 by Genia Hotter, RN Outcome: Adequate for Discharge 08/24/2022 0908 by Genia Hotter, RN Outcome: Progressing Goal: Will not experience complications related to urinary retention 08/24/2022 1538 by Genia Hotter, RN Outcome: Adequate for Discharge 08/24/2022 0908 by Genia Hotter, RN Outcome: Progressing   Problem: Pain Managment: Goal: General experience of comfort will improve 08/24/2022 1538 by Genia Hotter, RN Outcome: Adequate for Discharge 08/24/2022 0908 by Genia Hotter, RN Outcome: Progressing   Problem: Safety: Goal: Ability to remain free from injury will improve 08/24/2022 1538 by Genia Hotter, RN Outcome: Adequate for Discharge 08/24/2022 0908 by Genia Hotter, RN Outcome: Progressing   Problem: Skin Integrity: Goal: Risk for impaired skin integrity will decrease 08/24/2022 1538 by Genia Hotter, RN Outcome: Adequate for Discharge 08/24/2022 0908 by Genia Hotter, RN Outcome: Progressing

## 2022-08-26 LAB — CULTURE, BLOOD (ROUTINE X 2): Culture: NO GROWTH

## 2022-08-27 ENCOUNTER — Telehealth: Payer: Self-pay | Admitting: Physician Assistant

## 2022-08-27 NOTE — Telephone Encounter (Signed)
Attempted to contact patient to schedule hospital follow up. [phone]  is not longer in service. Per her brother, she no longer has that tele #. He will call patient and have her call office-[facility]

## 2022-08-30 ENCOUNTER — Encounter: Payer: Self-pay | Admitting: Physician Assistant

## 2022-08-30 ENCOUNTER — Ambulatory Visit (INDEPENDENT_AMBULATORY_CARE_PROVIDER_SITE_OTHER): Payer: 59 | Admitting: Physician Assistant

## 2022-08-30 VITALS — BP 110/70 | HR 103 | Temp 98.2°F | Resp 16 | Ht 66.0 in | Wt 174.0 lb

## 2022-08-30 DIAGNOSIS — J069 Acute upper respiratory infection, unspecified: Secondary | ICD-10-CM | POA: Diagnosis not present

## 2022-08-30 DIAGNOSIS — E119 Type 2 diabetes mellitus without complications: Secondary | ICD-10-CM | POA: Diagnosis not present

## 2022-08-30 DIAGNOSIS — E559 Vitamin D deficiency, unspecified: Secondary | ICD-10-CM | POA: Diagnosis not present

## 2022-08-30 DIAGNOSIS — D696 Thrombocytopenia, unspecified: Secondary | ICD-10-CM

## 2022-08-30 DIAGNOSIS — E538 Deficiency of other specified B group vitamins: Secondary | ICD-10-CM | POA: Diagnosis not present

## 2022-08-30 DIAGNOSIS — I480 Paroxysmal atrial fibrillation: Secondary | ICD-10-CM

## 2022-08-30 DIAGNOSIS — Z794 Long term (current) use of insulin: Secondary | ICD-10-CM | POA: Diagnosis not present

## 2022-08-30 DIAGNOSIS — E876 Hypokalemia: Secondary | ICD-10-CM | POA: Diagnosis not present

## 2022-08-30 DIAGNOSIS — K7030 Alcoholic cirrhosis of liver without ascites: Secondary | ICD-10-CM

## 2022-08-30 DIAGNOSIS — Z09 Encounter for follow-up examination after completed treatment for conditions other than malignant neoplasm: Secondary | ICD-10-CM

## 2022-08-30 MED ORDER — AZITHROMYCIN 250 MG PO TABS
ORAL_TABLET | ORAL | 0 refills | Status: AC
Start: 2022-08-30 — End: 2022-09-04

## 2022-08-30 NOTE — Progress Notes (Signed)
[facility] [address] [place], [place] [address]  Internal MEDICINE  Office Visit Note  Patient Name: Adriana Miller  [id number]  [id number]  Date of Service: 08/30/2022     Chief Complaint  Patient presents with   Hospitalization Follow-up   Atrial Fibrillation     HPI Pt is here for recent hospital follow up. -Patient went to ED on 08/21/22 due to N/V and weakness leading to inability to get up. She was admitted for afib RVR. WBC was low URI tests neg. She was placed on diltiazem drip and became rate controlled off drip. ABX were stopped on 5/6 after neg cultures and PT/OT rec SNF however pt improved and was able to ambulate and was discharged home with home health on 08/24/22. Her metoprolol was increased to full tab BID instead of 1/2 tab and was advised to continued alcohol abstinence and have follow up labs as OP. -Needs CMP, CBC and will go ahead and reorder other previous labs at same time -quit drinking alcohol about 2 weeks ago and plans to continue this -needs to increase metoprolol to 1 tab BID instead of 1/2 which is what she took today and explains elevated HR on arrival to office. She will increase dose now and instructions written out for her and highlighted on discharge paperwork brought to office today. -will also double check that she has been taking potassium as prscribed by cardiology on MWF. If not will call for script as potassium was still low in hospital -Has had a cough recently, no wheezing or SOB. Cough is all the time. Will go ahead and treat -Should be having home health restarting and thinks someone comes by tomorrow -reports feeling much better today, though still tired  Current Medication: Outpatient Encounter Medications as of 08/30/2022  Medication Sig   acetaminophen (TYLENOL) 325 MG tablet Take 2 tablets (650 mg total) by mouth every 6 (six) hours as needed for mild pain (or Fever >/= 101).   Alcohol Swabs (B-D SINGLE USE SWABS  REGULAR) PADS USE AS DIRECTED TWICE DAILY   allopurinol (ZYLOPRIM) 100 MG tablet TAKE 1 TABLET BY MOUTH DAILY   apixaban (ELIQUIS) 5 MG TABS tablet Take 1 tablet (5 mg total) by mouth 2 (two) times daily.   azithromycin (ZITHROMAX) 250 MG tablet Take 2 tablets on day 1, then 1 tablet daily on days 2 through 5   Blood Glucose Monitoring Suppl (ACCU-CHEK GUIDE ME) w/Device KIT Use as directed  dx E11.65   Calcium Carb-Cholecalciferol (CALCIUM 500/D) 500-5 MG-MCG TABS Take 1 tab po daily   Continuous Blood Gluc Receiver (DEXCOM G7 RECEIVER) DEVI Use for continuous blood glucose monitoring  DX E11.65   Continuous Blood Gluc Sensor (DEXCOM G7 SENSOR) MISC Use as directed for continuous glucose monitoring.  Change sensor every 10 days   E11.65   ergocalciferol (VITAMIN D2) 1.25 MG (50000 UT) capsule Take 1 capsule (50,000 Units total) by mouth once a week.   escitalopram (LEXAPRO) 10 MG tablet TAKE 1 TABLET BY MOUTH DAILY FOR ANXIETY/SLEEP   folic acid (FOLVITE) 1 MG tablet Take 1 tablet (1 mg total) by mouth daily.   furosemide (LASIX) 20 MG tablet TAKE 1 TABLET (20 MG TOTAL) BY MOUTH DAILY.   glimepiride (AMARYL) 1 MG tablet TAKE 1 TABLET BY MOUTH EVERY DAY WITH BREAKFAST   glucose blood (ACCU-CHEK GUIDE) test strip Use as instructed twice a day DX E11.65   insulin glargine, 1 Unit Dial, (TOUJEO SOLOSTAR) 300 UNIT/ML Solostar Pen  Inject 28 Units into the skin daily with supper.   Insulin Pen Needle (BD PEN NEEDLE NANO 2ND GEN) 32G X 4 MM MISC USE AS DIRECTED WITH TOUJEO   metoprolol tartrate (LOPRESSOR) 25 MG tablet Take 1 tablet (25 mg total) by mouth 2 (two) times daily. TAKE 1/2 TABLET BY MOUTH TWICE A DAY   mirtazapine (REMERON) 7.5 MG tablet TAKE 1 TABLET BY MOUTH DAILY AT  BEDTIME   potassium chloride (KLOR-CON) 8 MEQ tablet Take 1 tablet (8 mEq total) by mouth 3 (three) times a week. Monday, Wednesday, Friday   Safety Lancets 28G MISC Use as directed once a daily Dx e11.65   spironolactone  (ALDACTONE) 25 MG tablet TAKE 1 TABLET BY MOUTH DAILY FOR CIRRHOSIS OF LIVER   thiamine (VITAMIN B-1) 100 MG tablet Take 1 tablet (100 mg total) by mouth daily.   traZODone (DESYREL) 50 MG tablet TAKE 1 TO 2 TABLETS BY MOUTH AT  BEDTIME FOR INSOMNIA (Patient taking differently: Take 50-100 mg by mouth at bedtime.)   No facility-administered encounter medications on file as of 08/30/2022.    Surgical History: Past Surgical History:  Procedure Laterality Date   CEREBRAL ANEURYSM REPAIR  1991   COLONOSCOPY WITH PROPOFOL N/A 02/12/2019   Procedure: COLONOSCOPY WITH PROPOFOL;  Surgeon: Toney Reil, MD;  Location[facility] ENDOSCOPY;  Service: Gastroenterology;  Laterality: N/A;   ESOPHAGOGASTRODUODENOSCOPY (EGD) WITH PROPOFOL N/A 02/12/2019   Procedure: ESOPHAGOGASTRODUODENOSCOPY (EGD) WITH PROPOFOL;  Surgeon: Toney Reil, MD;  Location: [facility] ENDOSCOPY;  Service: Gastroenterology;  Laterality: N/A;   IR ANGIOGRAM SELECTIVE EACH ADDITIONAL VESSEL  09/21/2019   IR ANGIOGRAM SELECTIVE EACH ADDITIONAL VESSEL  09/21/2019   IR ANGIOGRAM SELECTIVE EACH ADDITIONAL VESSEL  09/21/2019   IR ANGIOGRAM SELECTIVE EACH ADDITIONAL VESSEL  09/21/2019   IR ANGIOGRAM SELECTIVE EACH ADDITIONAL VESSEL  10/02/2019   IR ANGIOGRAM VISCERAL SELECTIVE  09/21/2019   IR ANGIOGRAM VISCERAL SELECTIVE  09/21/2019   IR ANGIOGRAM VISCERAL SELECTIVE  10/02/2019   IR EMBO TUMOR ORGAN ISCHEMIA INFARCT INC GUIDE ROADMAPPING  10/02/2019   IR EMBO TUMOR ORGAN ISCHEMIA INFARCT INC GUIDE ROADMAPPING  10/02/2019   IR RADIOLOGIST EVAL & MGMT  08/28/2019   IR RADIOLOGIST EVAL & MGMT  01/30/2020   IR RADIOLOGIST EVAL & MGMT  04/30/2020   IR RADIOLOGIST EVAL & MGMT  07/30/2020   IR RADIOLOGIST EVAL & MGMT  10/28/2020   IR RADIOLOGIST EVAL & MGMT  02/24/2021   IR RADIOLOGIST EVAL & MGMT  06/08/2021   IR RADIOLOGIST EVAL & MGMT  11/24/2021   IR RADIOLOGIST EVAL & MGMT  05/25/2022   IR US GUIDE VASC ACCESS LEFT  09/21/2019   IR US GUIDE VASC ACCESS  LEFT  10/02/2019   OPEN REDUCTION INTERNAL FIXATION (ORIF) DISTAL RADIAL FRACTURE Right 08/06/2021   Procedure: OPEN REDUCTION INTERNAL FIXATION (ORIF) DISTAL RADIAL FRACTURE;  Surgeon: Kennedy Bucker, MD;  Location: [facility] ORS;  Service: Orthopedics;  Laterality: Right;    Medical History: 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)    B12 deficiency 04/05/2017   Blood in stool    Cerebral aneurysm    Chronic kidney disease    Diabetes mellitus without complication (HCC)    type 2   Gastric erosion with bleeding    Hepatocellular carcinoma (HCC)    Hyperlipidemia    Hypertension    Lower extremity cellulitis 02/08/2019   Paroxysmal A-fib (HCC)    Seizure disorder (HCC)  Family History: Family History  Problem Relation Age of Onset   Heart attack Brother 58    Social History   Socioeconomic History   Marital status: Divorced    Spouse name: Not on file   Number of children: Not on file   Years of education: Not on file   Highest education level: Not on file  Occupational History   Not on file  Tobacco Use   Smoking status: Never   Smokeless tobacco: Current    Types: Snuff  Vaping Use   Vaping Use: Never used  Substance and Sexual Activity   Alcohol use: Yes    Alcohol/week: 14.0 standard drinks of alcohol    Types: 14 Cans of beer per week    Comment: twice a week (beer)   Drug use: No   Sexual activity: Not Currently  Other Topics Concern   Not on file  Social History Narrative   Lives alone   Social Determinants of Health   Financial Resource Strain: Not on file  Food Insecurity: No Food Insecurity (08/22/2022)   Hunger Vital Sign    Worried About Running Out of Food in the Last Year: Never true    Ran Out of Food in the Last Year: Never true  Transportation Needs: No Transportation Needs (08/22/2022)   PRAPARE - Administrator, Civil Service (Medical): No    Lack of Transportation  (Non-Medical): No  Physical Activity: Not on file  Stress: Not on file  Social Connections: Not on file  Intimate Partner Violence: Not At Risk (08/22/2022)   Humiliation, Afraid, Rape, and Kick questionnaire    Fear of Current or Ex-Partner: No    Emotionally Abused: No    Physically Abused: No    Sexually Abused: No      Review of Systems  Constitutional:  Negative for chills and unexpected weight change.  HENT:  Negative for congestion, rhinorrhea, sneezing and sore throat.   Eyes:  Negative for redness.  Respiratory:  Negative for cough, chest tightness and shortness of breath.   Cardiovascular:  Negative for chest pain and palpitations.  Gastrointestinal:  Negative for abdominal pain, constipation, diarrhea, nausea and vomiting.  Genitourinary:  Negative for dysuria and frequency.  Musculoskeletal:  Positive for arthralgias. Negative for back pain, joint swelling and neck pain.  Skin:  Negative for rash.  Neurological:  Positive for weakness. Negative for tremors and numbness.  Hematological:  Negative for adenopathy. Does not bruise/bleed easily.  Psychiatric/Behavioral:  Negative for behavioral problems (Depression), sleep disturbance and suicidal ideas. The patient is not nervous/anxious.     Vital Signs: BP 110/70   Pulse (!) 103   Temp 98.2 F (36.8 C)   Resp 16   Ht 5\' 6"  (1.676 m)   Wt 174 lb (78.9 kg)   SpO2 99%   BMI 28.08 kg/m    Physical Exam Constitutional:      General: She is not in acute distress.    Appearance: She is well-developed. She is not diaphoretic.  HENT:     Head: Normocephalic and atraumatic.     Mouth/Throat:     Pharynx: No oropharyngeal exudate.  Eyes:     Pupils: Pupils are equal, round, and reactive to light.  Neck:     Thyroid: No thyromegaly.     Vascular: No JVD.     Trachea: No tracheal deviation.  Cardiovascular:     Rate and Rhythm: Normal rate and regular rhythm.     Heart  sounds: Normal heart sounds. No murmur  heard.    No friction rub. No gallop.  Pulmonary:     Effort: Pulmonary effort is normal. No respiratory distress.     Breath sounds: No wheezing or rales.  Chest:     Chest wall: No tenderness.  Abdominal:     General: Bowel sounds are normal.     Palpations: Abdomen is soft.  Musculoskeletal:        General: Normal range of motion.     Cervical back: Normal range of motion and neck supple.  Lymphadenopathy:     Cervical: No cervical adenopathy.  Skin:    General: Skin is warm and dry.  Neurological:     Mental Status: She is alert and oriented to person, place, and time.     Cranial Nerves: No cranial nerve deficit.     Comments: Utilizes cane for mobility  Psychiatric:        Behavior: Behavior normal.        Thought Content: Thought content normal.        Judgment: Judgment normal.       Assessment/Plan: 1. Hospital discharge follow-up Reviewed hospital course and ordered OP labs for monitoring  2. Paroxysmal atrial fibrillation (HCC) Followed by cardiology, reminded patient that metoprolol was increased and to adjust pill box accordingly  3. Type 2 diabetes mellitus without complication, with long-term current use of insulin (HCC) - Hgb A1C w/o eAG  4. Vitamin D deficiency - VITAMIN D 25 Hydroxy (Vit-D Deficiency, Fractures)  5. B12 deficiency - B12 and Folate Panel  6. Hypokalemia - Comprehensive metabolic panel  7. Low platelet count (HCC) - CBC w/Diff/Platelet  8. Alcoholic cirrhosis of liver without ascites (HCC) - Vitamin B1  9. Upper respiratory tract infection, unspecified type - azithromycin (ZITHROMAX) 250 MG tablet; Take 2 tablets on day 1, then 1 tablet daily on days 2 through 5  Dispense: 6 tablet; Refill: 0   General Counseling: Etana verbalizes understanding of the findings of todays visit and agrees with plan of treatment. I have discussed any further diagnostic evaluation that may be needed or ordered today. We also reviewed her  medications today. she has been encouraged to call the office with any questions or concerns that should arise related to todays visit.    Counseling:    Orders Placed This Encounter  Procedures   CBC w/Diff/Platelet   Comprehensive metabolic panel   Z61 and Folate Panel   VITAMIN D 25 Hydroxy (Vit-D Deficiency, Fractures)   Hgb A1C w/o eAG   Vitamin B1    This patient was seen by Lynn Ito, PA-C in collaboration with Dr. Beverely Risen as a part of collaborative care agreement.   I have reviewed all medical records from hospital follow up including radiology reports and consults from other physicians. Appropriate follow up diagnostics will be scheduled as needed. Patient/ Family understands the plan of treatment. Time spent 40 minutes.   Dr Lyndon Code, MD Internal Medicine

## 2022-08-31 DIAGNOSIS — D696 Thrombocytopenia, unspecified: Secondary | ICD-10-CM | POA: Diagnosis not present

## 2022-08-31 DIAGNOSIS — N179 Acute kidney failure, unspecified: Secondary | ICD-10-CM | POA: Diagnosis not present

## 2022-08-31 DIAGNOSIS — G9341 Metabolic encephalopathy: Secondary | ICD-10-CM | POA: Diagnosis not present

## 2022-08-31 DIAGNOSIS — E782 Mixed hyperlipidemia: Secondary | ICD-10-CM | POA: Diagnosis not present

## 2022-08-31 DIAGNOSIS — Z8505 Personal history of malignant neoplasm of liver: Secondary | ICD-10-CM | POA: Diagnosis not present

## 2022-08-31 DIAGNOSIS — I110 Hypertensive heart disease with heart failure: Secondary | ICD-10-CM | POA: Diagnosis not present

## 2022-08-31 DIAGNOSIS — I480 Paroxysmal atrial fibrillation: Secondary | ICD-10-CM | POA: Diagnosis not present

## 2022-08-31 DIAGNOSIS — I5032 Chronic diastolic (congestive) heart failure: Secondary | ICD-10-CM | POA: Diagnosis not present

## 2022-08-31 DIAGNOSIS — Z794 Long term (current) use of insulin: Secondary | ICD-10-CM | POA: Diagnosis not present

## 2022-08-31 DIAGNOSIS — D72819 Decreased white blood cell count, unspecified: Secondary | ICD-10-CM | POA: Diagnosis not present

## 2022-08-31 DIAGNOSIS — E119 Type 2 diabetes mellitus without complications: Secondary | ICD-10-CM | POA: Diagnosis not present

## 2022-08-31 DIAGNOSIS — Z7901 Long term (current) use of anticoagulants: Secondary | ICD-10-CM | POA: Diagnosis not present

## 2022-08-31 DIAGNOSIS — Z7984 Long term (current) use of oral hypoglycemic drugs: Secondary | ICD-10-CM | POA: Diagnosis not present

## 2022-09-03 DIAGNOSIS — I110 Hypertensive heart disease with heart failure: Secondary | ICD-10-CM | POA: Diagnosis not present

## 2022-09-03 DIAGNOSIS — G9341 Metabolic encephalopathy: Secondary | ICD-10-CM | POA: Diagnosis not present

## 2022-09-03 DIAGNOSIS — Z794 Long term (current) use of insulin: Secondary | ICD-10-CM | POA: Diagnosis not present

## 2022-09-03 DIAGNOSIS — Z8505 Personal history of malignant neoplasm of liver: Secondary | ICD-10-CM | POA: Diagnosis not present

## 2022-09-03 DIAGNOSIS — E782 Mixed hyperlipidemia: Secondary | ICD-10-CM | POA: Diagnosis not present

## 2022-09-03 DIAGNOSIS — Z7901 Long term (current) use of anticoagulants: Secondary | ICD-10-CM | POA: Diagnosis not present

## 2022-09-03 DIAGNOSIS — N179 Acute kidney failure, unspecified: Secondary | ICD-10-CM | POA: Diagnosis not present

## 2022-09-03 DIAGNOSIS — I480 Paroxysmal atrial fibrillation: Secondary | ICD-10-CM | POA: Diagnosis not present

## 2022-09-03 DIAGNOSIS — Z7984 Long term (current) use of oral hypoglycemic drugs: Secondary | ICD-10-CM | POA: Diagnosis not present

## 2022-09-03 DIAGNOSIS — D72819 Decreased white blood cell count, unspecified: Secondary | ICD-10-CM | POA: Diagnosis not present

## 2022-09-03 DIAGNOSIS — D696 Thrombocytopenia, unspecified: Secondary | ICD-10-CM | POA: Diagnosis not present

## 2022-09-03 DIAGNOSIS — E119 Type 2 diabetes mellitus without complications: Secondary | ICD-10-CM | POA: Diagnosis not present

## 2022-09-03 DIAGNOSIS — I5032 Chronic diastolic (congestive) heart failure: Secondary | ICD-10-CM | POA: Diagnosis not present

## 2022-09-06 ENCOUNTER — Telehealth: Payer: Self-pay

## 2022-09-06 NOTE — Telephone Encounter (Signed)
Error

## 2022-09-06 NOTE — Telephone Encounter (Signed)
Gave verbal order for Physical therapy from [facility] to angie [phone] for once a week for 1 ,2 times a week for 4 week and once a week for 4 weeks

## 2022-09-08 DIAGNOSIS — Z8505 Personal history of malignant neoplasm of liver: Secondary | ICD-10-CM | POA: Diagnosis not present

## 2022-09-08 DIAGNOSIS — E782 Mixed hyperlipidemia: Secondary | ICD-10-CM | POA: Diagnosis not present

## 2022-09-08 DIAGNOSIS — I5032 Chronic diastolic (congestive) heart failure: Secondary | ICD-10-CM | POA: Diagnosis not present

## 2022-09-08 DIAGNOSIS — Z794 Long term (current) use of insulin: Secondary | ICD-10-CM | POA: Diagnosis not present

## 2022-09-08 DIAGNOSIS — G9341 Metabolic encephalopathy: Secondary | ICD-10-CM | POA: Diagnosis not present

## 2022-09-08 DIAGNOSIS — N179 Acute kidney failure, unspecified: Secondary | ICD-10-CM | POA: Diagnosis not present

## 2022-09-08 DIAGNOSIS — Z7901 Long term (current) use of anticoagulants: Secondary | ICD-10-CM | POA: Diagnosis not present

## 2022-09-08 DIAGNOSIS — D72819 Decreased white blood cell count, unspecified: Secondary | ICD-10-CM | POA: Diagnosis not present

## 2022-09-08 DIAGNOSIS — D696 Thrombocytopenia, unspecified: Secondary | ICD-10-CM | POA: Diagnosis not present

## 2022-09-08 DIAGNOSIS — Z7984 Long term (current) use of oral hypoglycemic drugs: Secondary | ICD-10-CM | POA: Diagnosis not present

## 2022-09-08 DIAGNOSIS — E119 Type 2 diabetes mellitus without complications: Secondary | ICD-10-CM | POA: Diagnosis not present

## 2022-09-08 DIAGNOSIS — I480 Paroxysmal atrial fibrillation: Secondary | ICD-10-CM | POA: Diagnosis not present

## 2022-09-08 DIAGNOSIS — I110 Hypertensive heart disease with heart failure: Secondary | ICD-10-CM | POA: Diagnosis not present

## 2022-09-10 DIAGNOSIS — D72819 Decreased white blood cell count, unspecified: Secondary | ICD-10-CM | POA: Diagnosis not present

## 2022-09-10 DIAGNOSIS — D696 Thrombocytopenia, unspecified: Secondary | ICD-10-CM | POA: Diagnosis not present

## 2022-09-10 DIAGNOSIS — E119 Type 2 diabetes mellitus without complications: Secondary | ICD-10-CM | POA: Diagnosis not present

## 2022-09-10 DIAGNOSIS — Z7901 Long term (current) use of anticoagulants: Secondary | ICD-10-CM | POA: Diagnosis not present

## 2022-09-10 DIAGNOSIS — E782 Mixed hyperlipidemia: Secondary | ICD-10-CM | POA: Diagnosis not present

## 2022-09-10 DIAGNOSIS — I5032 Chronic diastolic (congestive) heart failure: Secondary | ICD-10-CM | POA: Diagnosis not present

## 2022-09-10 DIAGNOSIS — Z7984 Long term (current) use of oral hypoglycemic drugs: Secondary | ICD-10-CM | POA: Diagnosis not present

## 2022-09-10 DIAGNOSIS — N179 Acute kidney failure, unspecified: Secondary | ICD-10-CM | POA: Diagnosis not present

## 2022-09-10 DIAGNOSIS — Z8505 Personal history of malignant neoplasm of liver: Secondary | ICD-10-CM | POA: Diagnosis not present

## 2022-09-10 DIAGNOSIS — I110 Hypertensive heart disease with heart failure: Secondary | ICD-10-CM | POA: Diagnosis not present

## 2022-09-10 DIAGNOSIS — G9341 Metabolic encephalopathy: Secondary | ICD-10-CM | POA: Diagnosis not present

## 2022-09-10 DIAGNOSIS — Z794 Long term (current) use of insulin: Secondary | ICD-10-CM | POA: Diagnosis not present

## 2022-09-10 DIAGNOSIS — I480 Paroxysmal atrial fibrillation: Secondary | ICD-10-CM | POA: Diagnosis not present

## 2022-09-14 DIAGNOSIS — I480 Paroxysmal atrial fibrillation: Secondary | ICD-10-CM | POA: Diagnosis not present

## 2022-09-14 DIAGNOSIS — G9341 Metabolic encephalopathy: Secondary | ICD-10-CM | POA: Diagnosis not present

## 2022-09-14 DIAGNOSIS — Z8505 Personal history of malignant neoplasm of liver: Secondary | ICD-10-CM | POA: Diagnosis not present

## 2022-09-14 DIAGNOSIS — Z7901 Long term (current) use of anticoagulants: Secondary | ICD-10-CM | POA: Diagnosis not present

## 2022-09-14 DIAGNOSIS — Z794 Long term (current) use of insulin: Secondary | ICD-10-CM | POA: Diagnosis not present

## 2022-09-14 DIAGNOSIS — E119 Type 2 diabetes mellitus without complications: Secondary | ICD-10-CM | POA: Diagnosis not present

## 2022-09-14 DIAGNOSIS — Z7984 Long term (current) use of oral hypoglycemic drugs: Secondary | ICD-10-CM | POA: Diagnosis not present

## 2022-09-14 DIAGNOSIS — D696 Thrombocytopenia, unspecified: Secondary | ICD-10-CM | POA: Diagnosis not present

## 2022-09-14 DIAGNOSIS — D72819 Decreased white blood cell count, unspecified: Secondary | ICD-10-CM | POA: Diagnosis not present

## 2022-09-14 DIAGNOSIS — I110 Hypertensive heart disease with heart failure: Secondary | ICD-10-CM | POA: Diagnosis not present

## 2022-09-14 DIAGNOSIS — E782 Mixed hyperlipidemia: Secondary | ICD-10-CM | POA: Diagnosis not present

## 2022-09-14 DIAGNOSIS — I5032 Chronic diastolic (congestive) heart failure: Secondary | ICD-10-CM | POA: Diagnosis not present

## 2022-09-14 DIAGNOSIS — N179 Acute kidney failure, unspecified: Secondary | ICD-10-CM | POA: Diagnosis not present

## 2022-09-16 DIAGNOSIS — D696 Thrombocytopenia, unspecified: Secondary | ICD-10-CM | POA: Diagnosis not present

## 2022-09-16 DIAGNOSIS — D72819 Decreased white blood cell count, unspecified: Secondary | ICD-10-CM | POA: Diagnosis not present

## 2022-09-16 DIAGNOSIS — Z8505 Personal history of malignant neoplasm of liver: Secondary | ICD-10-CM | POA: Diagnosis not present

## 2022-09-16 DIAGNOSIS — I110 Hypertensive heart disease with heart failure: Secondary | ICD-10-CM | POA: Diagnosis not present

## 2022-09-16 DIAGNOSIS — E782 Mixed hyperlipidemia: Secondary | ICD-10-CM | POA: Diagnosis not present

## 2022-09-16 DIAGNOSIS — G9341 Metabolic encephalopathy: Secondary | ICD-10-CM | POA: Diagnosis not present

## 2022-09-16 DIAGNOSIS — I480 Paroxysmal atrial fibrillation: Secondary | ICD-10-CM | POA: Diagnosis not present

## 2022-09-16 DIAGNOSIS — Z794 Long term (current) use of insulin: Secondary | ICD-10-CM | POA: Diagnosis not present

## 2022-09-16 DIAGNOSIS — Z7901 Long term (current) use of anticoagulants: Secondary | ICD-10-CM | POA: Diagnosis not present

## 2022-09-16 DIAGNOSIS — I5032 Chronic diastolic (congestive) heart failure: Secondary | ICD-10-CM | POA: Diagnosis not present

## 2022-09-16 DIAGNOSIS — E119 Type 2 diabetes mellitus without complications: Secondary | ICD-10-CM | POA: Diagnosis not present

## 2022-09-16 DIAGNOSIS — N179 Acute kidney failure, unspecified: Secondary | ICD-10-CM | POA: Diagnosis not present

## 2022-09-16 DIAGNOSIS — Z7984 Long term (current) use of oral hypoglycemic drugs: Secondary | ICD-10-CM | POA: Diagnosis not present

## 2022-09-17 DIAGNOSIS — Z7901 Long term (current) use of anticoagulants: Secondary | ICD-10-CM | POA: Diagnosis not present

## 2022-09-17 DIAGNOSIS — G9341 Metabolic encephalopathy: Secondary | ICD-10-CM | POA: Diagnosis not present

## 2022-09-17 DIAGNOSIS — I480 Paroxysmal atrial fibrillation: Secondary | ICD-10-CM | POA: Diagnosis not present

## 2022-09-17 DIAGNOSIS — Z8505 Personal history of malignant neoplasm of liver: Secondary | ICD-10-CM | POA: Diagnosis not present

## 2022-09-17 DIAGNOSIS — E119 Type 2 diabetes mellitus without complications: Secondary | ICD-10-CM | POA: Diagnosis not present

## 2022-09-17 DIAGNOSIS — D72819 Decreased white blood cell count, unspecified: Secondary | ICD-10-CM | POA: Diagnosis not present

## 2022-09-17 DIAGNOSIS — Z7984 Long term (current) use of oral hypoglycemic drugs: Secondary | ICD-10-CM | POA: Diagnosis not present

## 2022-09-17 DIAGNOSIS — E782 Mixed hyperlipidemia: Secondary | ICD-10-CM | POA: Diagnosis not present

## 2022-09-17 DIAGNOSIS — I110 Hypertensive heart disease with heart failure: Secondary | ICD-10-CM | POA: Diagnosis not present

## 2022-09-17 DIAGNOSIS — D696 Thrombocytopenia, unspecified: Secondary | ICD-10-CM | POA: Diagnosis not present

## 2022-09-17 DIAGNOSIS — Z794 Long term (current) use of insulin: Secondary | ICD-10-CM | POA: Diagnosis not present

## 2022-09-17 DIAGNOSIS — N179 Acute kidney failure, unspecified: Secondary | ICD-10-CM | POA: Diagnosis not present

## 2022-09-17 DIAGNOSIS — I5032 Chronic diastolic (congestive) heart failure: Secondary | ICD-10-CM | POA: Diagnosis not present

## 2022-09-19 ENCOUNTER — Other Ambulatory Visit: Payer: Self-pay | Admitting: Internal Medicine

## 2022-09-19 DIAGNOSIS — I480 Paroxysmal atrial fibrillation: Secondary | ICD-10-CM

## 2022-09-20 ENCOUNTER — Telehealth: Payer: Self-pay

## 2022-09-20 DIAGNOSIS — N179 Acute kidney failure, unspecified: Secondary | ICD-10-CM | POA: Diagnosis not present

## 2022-09-20 DIAGNOSIS — D696 Thrombocytopenia, unspecified: Secondary | ICD-10-CM | POA: Diagnosis not present

## 2022-09-20 DIAGNOSIS — Z8505 Personal history of malignant neoplasm of liver: Secondary | ICD-10-CM | POA: Diagnosis not present

## 2022-09-20 DIAGNOSIS — I480 Paroxysmal atrial fibrillation: Secondary | ICD-10-CM | POA: Diagnosis not present

## 2022-09-20 DIAGNOSIS — Z7901 Long term (current) use of anticoagulants: Secondary | ICD-10-CM | POA: Diagnosis not present

## 2022-09-20 DIAGNOSIS — G9341 Metabolic encephalopathy: Secondary | ICD-10-CM | POA: Diagnosis not present

## 2022-09-20 DIAGNOSIS — I110 Hypertensive heart disease with heart failure: Secondary | ICD-10-CM | POA: Diagnosis not present

## 2022-09-20 DIAGNOSIS — E119 Type 2 diabetes mellitus without complications: Secondary | ICD-10-CM | POA: Diagnosis not present

## 2022-09-20 DIAGNOSIS — D72819 Decreased white blood cell count, unspecified: Secondary | ICD-10-CM | POA: Diagnosis not present

## 2022-09-20 DIAGNOSIS — Z7984 Long term (current) use of oral hypoglycemic drugs: Secondary | ICD-10-CM | POA: Diagnosis not present

## 2022-09-20 DIAGNOSIS — E782 Mixed hyperlipidemia: Secondary | ICD-10-CM | POA: Diagnosis not present

## 2022-09-20 DIAGNOSIS — I5032 Chronic diastolic (congestive) heart failure: Secondary | ICD-10-CM | POA: Diagnosis not present

## 2022-09-20 DIAGNOSIS — Z794 Long term (current) use of insulin: Secondary | ICD-10-CM | POA: Diagnosis not present

## 2022-09-20 NOTE — Telephone Encounter (Signed)
Prescription refill request for Eliquis received. Indication:afib Last office visit:1/24 Scr:0.95  5/24 Age: 67 Weight:78.9  kg  Prescription refilled

## 2022-09-20 NOTE — Telephone Encounter (Signed)
Refill request

## 2022-09-20 NOTE — Telephone Encounter (Signed)
Gave verbal  order to [facility]  for occupational therapy to connie [phone] for once a week for 6 week

## 2022-09-22 ENCOUNTER — Emergency Department: Payer: 59

## 2022-09-22 ENCOUNTER — Encounter: Payer: Self-pay | Admitting: Emergency Medicine

## 2022-09-22 ENCOUNTER — Emergency Department
Admission: EM | Admit: 2022-09-22 | Discharge: 2022-09-23 | Disposition: A | Discharge status: Home / Self Care | Payer: 59 | Attending: Emergency Medicine | Admitting: Emergency Medicine

## 2022-09-22 DIAGNOSIS — S0990XA Unspecified injury of head, initial encounter: Secondary | ICD-10-CM | POA: Diagnosis not present

## 2022-09-22 DIAGNOSIS — W010XXA Fall on same level from slipping, tripping and stumbling without subsequent striking against object, initial encounter: Secondary | ICD-10-CM | POA: Insufficient documentation

## 2022-09-22 DIAGNOSIS — Z743 Need for continuous supervision: Secondary | ICD-10-CM | POA: Diagnosis not present

## 2022-09-22 DIAGNOSIS — I499 Cardiac arrhythmia, unspecified: Secondary | ICD-10-CM | POA: Diagnosis not present

## 2022-09-22 DIAGNOSIS — S1081XA Abrasion of other specified part of neck, initial encounter: Secondary | ICD-10-CM | POA: Diagnosis not present

## 2022-09-22 DIAGNOSIS — I10 Essential (primary) hypertension: Secondary | ICD-10-CM | POA: Diagnosis not present

## 2022-09-22 DIAGNOSIS — W19XXXA Unspecified fall, initial encounter: Secondary | ICD-10-CM | POA: Diagnosis not present

## 2022-09-22 DIAGNOSIS — S1091XA Abrasion of unspecified part of neck, initial encounter: Secondary | ICD-10-CM | POA: Insufficient documentation

## 2022-09-22 DIAGNOSIS — R519 Headache, unspecified: Secondary | ICD-10-CM | POA: Diagnosis not present

## 2022-09-22 DIAGNOSIS — R000 Tachycardia, unspecified: Secondary | ICD-10-CM | POA: Diagnosis not present

## 2022-09-22 DIAGNOSIS — M542 Cervicalgia: Secondary | ICD-10-CM | POA: Diagnosis not present

## 2022-09-22 DIAGNOSIS — Z043 Encounter for examination and observation following other accident: Secondary | ICD-10-CM | POA: Diagnosis not present

## 2022-09-22 DIAGNOSIS — Z7901 Long term (current) use of anticoagulants: Secondary | ICD-10-CM | POA: Diagnosis not present

## 2022-09-22 DIAGNOSIS — F1010 Alcohol abuse, uncomplicated: Secondary | ICD-10-CM | POA: Diagnosis not present

## 2022-09-22 LAB — CBC WITH DIFFERENTIAL/PLATELET
Abs Immature Granulocytes: 0.04 10*3/uL (ref 0.00–0.07)
Basophils Absolute: 0.1 10*3/uL (ref 0.0–0.1)
Basophils Relative: 1 %
Eosinophils Absolute: 0.1 10*3/uL (ref 0.0–0.5)
Eosinophils Relative: 1 %
HCT: 41.6 % (ref 36.0–46.0)
Hemoglobin: 13.1 g/dL (ref 12.0–15.0)
Immature Granulocytes: 0 %
Lymphocytes Relative: 28 %
Lymphs Abs: 2.5 10*3/uL (ref 0.7–4.0)
MCH: 32 pg (ref 26.0–34.0)
MCHC: 31.5 g/dL (ref 30.0–36.0)
MCV: 101.7 fL — ABNORMAL HIGH (ref 80.0–100.0)
Monocytes Absolute: 0.7 10*3/uL (ref 0.1–1.0)
Monocytes Relative: 8 %
Neutro Abs: 5.7 10*3/uL (ref 1.7–7.7)
Neutrophils Relative %: 62 %
Platelets: 186 10*3/uL (ref 150–400)
RBC: 4.09 MIL/uL (ref 3.87–5.11)
RDW: 13.1 % (ref 11.5–15.5)
WBC: 9 10*3/uL (ref 4.0–10.5)
nRBC: 0 % (ref 0.0–0.2)

## 2022-09-22 NOTE — ED Notes (Signed)
When RN walked into room, pt was choking on doritos. She was coughing and eyes watering. She was able to cough and talk a little. THis episode went on for several minutes. EDP notified.

## 2022-09-22 NOTE — ED Provider Notes (Signed)
[facility] Provider Note    Event Date/Time   First MD Initiated Contact with Patient 09/22/22 2258     (approximate)   History   Fall   HPI  Adriana Miller is a 67 y.o. female who presents to the ED for evaluation of Fall   I review I reviewed medical DC summary from 5/7.  Admitted for a few days for A-fib with RVR.  Known alcohol abuse.  Anticoagulated on Eliquis.  Cirrhosis.  History of cerebral aneurysm repair.  Patient presents to the ED from home for the ration of multiple falls today.  She admits to drinking " at least one" alcoholic beverage this evening and multiple falls.  Reports 1 fall when she slipped and fell between the bed and nightstand, striking her left temple.  No syncope.  Reports a subsequent fall after her brother helped her up, possibly hitting contralateral side of her head.   Right now, she is asking to go home and has no complaints.  Denies any recent illnesses   Physical Exam   Triage Vital Signs: ED Triage Vitals  Enc Vitals Group     BP      Pulse      Resp      Temp      Temp src      SpO2      Weight      Height      Head Circumference      Peak Flow      Pain Score      Pain Loc      Pain Edu?      Excl. in GC?     Most recent vital signs: Vitals:   09/23/22 0100 09/23/22 0130  BP: (!) 153/100 (!) 144/91  Pulse: (!) 107 87  Resp:  16  Temp:    SpO2: 96% 96%    General: Awake, no distress.  Seems mildly intoxicated, pleasant conversational.  Trying to eat chips and bottle that she brought with her. CV:  Good peripheral perfusion.  Resp:  Normal effort.  Abd:  No distention.  MSK:  No deformity noted.  Neuro:  No focal deficits appreciated.  Full strength and sensation throughout Other:  Small superficial abrasion to right side of the neck without hematoma, laceration, changes in voice or mobility of the neck. No signs of trauma to left temple.  No signs of EOM entrapment.   ED Results /  Procedures / Treatments   Labs (all labs ordered are listed, but only abnormal results are displayed) Labs Reviewed  CBC WITH DIFFERENTIAL/PLATELET - Abnormal; Notable for the following components:      Result Value   MCV 101.7 (*)    All other components within normal limits  PROTIME-INR - Abnormal; Notable for the following components:   Prothrombin Time 17.5 (*)    INR 1.4 (*)    All other components within normal limits  COMPREHENSIVE METABOLIC PANEL - Abnormal; Notable for the following components:   Creatinine, Ser 1.16 (*)    Calcium 8.4 (*)    Albumin 2.8 (*)    GFR, Estimated 51 (*)    All other components within normal limits  MAGNESIUM  URINALYSIS, ROUTINE W REFLEX MICROSCOPIC  ETHANOL    EKG A-fib with a rate of 108 bpm.  Normal axis and intervals.  No STEMI.  RADIOLOGY CT head interpreted by me without evidence of acute intracranial pathology CT cervical spine interpreted by me without  evidence of fracture or dislocation CXR interpreted by me without evidence of acute cardiopulmonary pathology.  Official radiology report(s): CT HEAD WO CONTRAST ( )  Result Date: 09/23/2022 CLINICAL DATA:  Recent fall with headaches and neck pain, initial encounter EXAM: CT HEAD WITHOUT CONTRAST CT CERVICAL SPINE WITHOUT CONTRAST TECHNIQUE: Multidetector CT imaging of the head and cervical spine was performed following the standard protocol without intravenous contrast. Multiplanar CT image reconstructions of the cervical spine were also generated. RADIATION DOSE REDUCTION: This exam was performed according to the departmental dose-optimization program which includes automated exposure control, adjustment of the mA and/or kV according to patient size and/or use of iterative reconstruction technique. COMPARISON:  06/17/2022 FINDINGS: CT HEAD FINDINGS Brain: No evidence of acute infarction, hemorrhage, hydrocephalus, extra-axial collection or mass lesion/mass effect. Mild atrophic  changes are noted. Vascular: No hyperdense vessel or unexpected calcification. Skull: Postsurgical changes are noted on the right. Sinuses/Orbits: No acute finding. Other: None. CT CERVICAL SPINE FINDINGS Alignment: Within normal limits. Skull base and vertebrae: 7 cervical segments are well visualized. Multilevel osteophytic changes and facet hypertrophic changes are seen. No acute fracture or acute facet abnormality is noted. Soft tissues and spinal canal: Surrounding soft tissue structures are within normal limits. Upper chest: Visualized lung apices are unremarkable. Other: None IMPRESSION: CT of the head: Chronic atrophic changes without acute abnormality. CT of the cervical spine: Multilevel degenerative change without acute abnormality. Electronically Signed   By: Alcide Clever M.D.   On: 09/23/2022 00:06   CT Cervical Spine Wo Contrast  Result Date: 09/23/2022 CLINICAL DATA:  Recent fall with headaches and neck pain, initial encounter EXAM: CT HEAD WITHOUT CONTRAST CT CERVICAL SPINE WITHOUT CONTRAST TECHNIQUE: Multidetector CT imaging of the head and cervical spine was performed following the standard protocol without intravenous contrast. Multiplanar CT image reconstructions of the cervical spine were also generated. RADIATION DOSE REDUCTION: This exam was performed according to the departmental dose-optimization program which includes automated exposure control, adjustment of the mA and/or kV according to patient size and/or use of iterative reconstruction technique. COMPARISON:  06/17/2022 FINDINGS: CT HEAD FINDINGS Brain: No evidence of acute infarction, hemorrhage, hydrocephalus, extra-axial collection or mass lesion/mass effect. Mild atrophic changes are noted. Vascular: No hyperdense vessel or unexpected calcification. Skull: Postsurgical changes are noted on the right. Sinuses/Orbits: No acute finding. Other: None. CT CERVICAL SPINE FINDINGS Alignment: Within normal limits. Skull base and  vertebrae: 7 cervical segments are well visualized. Multilevel osteophytic changes and facet hypertrophic changes are seen. No acute fracture or acute facet abnormality is noted. Soft tissues and spinal canal: Surrounding soft tissue structures are within normal limits. Upper chest: Visualized lung apices are unremarkable. Other: None IMPRESSION: CT of the head: Chronic atrophic changes without acute abnormality. CT of the cervical spine: Multilevel degenerative change without acute abnormality. Electronically Signed   By: Alcide Clever M.D.   On: 09/23/2022 00:06   DG Chest Portable 1 View  Result Date: 09/22/2022 CLINICAL DATA:  Suspect aspiration.  Fall. EXAM: PORTABLE CHEST 1 VIEW COMPARISON:  Chest x-ray 08/21/2022 FINDINGS: The heart size and mediastinal contours are within normal limits. Both lungs are clear. The visualized skeletal structures are unremarkable. IMPRESSION: No active disease. Electronically Signed   By: Darliss Cheney M.D.   On: 09/22/2022 23:32    PROCEDURES and INTERVENTIONS:  .1-3 Lead EKG Interpretation  Performed by: Delton Prairie, MD Authorized by: Delton Prairie, MD     Interpretation: abnormal     ECG rate:  104  ECG rate assessment: tachycardic     Rhythm: atrial fibrillation     Ectopy: none     Conduction: normal     Medications - No data to display   IMPRESSION / MDM / ASSESSMENT AND PLAN / ED COURSE  I reviewed the triage vital signs and the nursing notes.  Differential diagnosis includes, but is not limited to, cardiogenic syncope, seizure, ICH, aspiration, alcoholic ketoacidosis, sepsis  {Patient presents with symptoms of an acute illness or injury that is potentially life-threatening.  Patient with history of alcohol abuse presents after a fall without clear signs of acute pathology and suitable for outpatient management.  Look systemically well and without signs of acute neurologic deficits significant open injuries.  Screening blood work is benign  without signs of metabolic derangements and has an essentially normal CBC.  Imaging is benign, as above, and on reassessments she is feeling well and asking to go.  I discussed return precautions.  Clinical Course as of 09/23/22 0238  Thu Sep 23, 2022  0236 Reassessed.  Patient reports feeling well.  She is sitting up in bed eating a meal tray and asking to go home. [DS]    Clinical Course User Index [DS] Delton Prairie, MD     FINAL CLINICAL IMPRESSION(S) / ED DIAGNOSES   Final diagnoses:  Fall, initial encounter  Alcohol abuse     Rx / DC Orders   ED Discharge Orders     None        Note:  This document was prepared using Dragon voice recognition software and may include unintentional dictation errors.   Delton Prairie, MD 09/23/22 [phone]

## 2022-09-22 NOTE — ED Notes (Signed)
CT called to get pt.

## 2022-09-22 NOTE — ED Triage Notes (Addendum)
Family called due to fact that pt has fallen x 5 today. They state she has been drinking today but not at her usual amount to cause falls. On Eliquis and hit head on left side on coffee table. Grips equal. Walked per baseline to stretcher per family on scene. VSS. 146/83, 84, 98% RA, 18 RR. Pupils constricted. Pt currently in bed eating doritos she brought in.

## 2022-09-23 LAB — MAGNESIUM: Magnesium: 2 mg/dL (ref 1.7–2.4)

## 2022-09-23 LAB — COMPREHENSIVE METABOLIC PANEL
ALT: 16 U/L (ref 0–44)
AST: 36 U/L (ref 15–41)
Albumin: 2.8 g/dL — ABNORMAL LOW (ref 3.5–5.0)
Alkaline Phosphatase: 77 U/L (ref 38–126)
Anion gap: 10 (ref 5–15)
BUN: 10 mg/dL (ref 8–23)
CO2: 24 mmol/L (ref 22–32)
Calcium: 8.4 mg/dL — ABNORMAL LOW (ref 8.9–10.3)
Chloride: 108 mmol/L (ref 98–111)
Creatinine, Ser: 1.16 mg/dL — ABNORMAL HIGH (ref 0.44–1.00)
GFR, Estimated: 51 mL/min — ABNORMAL LOW (ref >60)
Glucose, Bld: 76 mg/dL (ref 70–99)
Potassium: 4.6 mmol/L (ref 3.5–5.1)
Sodium: 142 mmol/L (ref 135–145)
Total Bilirubin: 0.7 mg/dL (ref 0.3–1.2)
Total Protein: 7.6 g/dL (ref 6.5–8.1)

## 2022-09-23 LAB — PROTIME-INR
INR: 1.4 — ABNORMAL HIGH (ref 0.8–1.2)
Prothrombin Time: 17.5 seconds — ABNORMAL HIGH (ref 11.4–15.2)

## 2022-09-23 NOTE — ED Notes (Signed)
Pt's brother, Simona Huh, contacted to come get patient. He is OTW.

## 2022-09-23 NOTE — ED Notes (Signed)
Pt walked to restroom with minimal assistance. Was unable to get urine sample in hat. Pt changed into fresh pull up and gown. Instructed to call out when she needed to void again. NO needs.

## 2022-09-23 NOTE — ED Notes (Signed)
RN gave pt juice and boxed lunch. She is alert and oriented. Expressed no needs.

## 2022-09-23 NOTE — ED Notes (Signed)
Pt using snuff out of her personal bag. Informed she is not allowed to use tobacco products in hospital.

## 2022-09-23 NOTE — ED Notes (Signed)
Pt given boxed lunch

## 2022-09-23 NOTE — ED Notes (Signed)
Pt assisted with getting dressed.

## 2022-09-24 ENCOUNTER — Telehealth: Payer: Self-pay | Admitting: Physician Assistant

## 2022-09-24 DIAGNOSIS — I480 Paroxysmal atrial fibrillation: Secondary | ICD-10-CM | POA: Diagnosis not present

## 2022-09-24 DIAGNOSIS — G9341 Metabolic encephalopathy: Secondary | ICD-10-CM | POA: Diagnosis not present

## 2022-09-24 DIAGNOSIS — Z7984 Long term (current) use of oral hypoglycemic drugs: Secondary | ICD-10-CM | POA: Diagnosis not present

## 2022-09-24 DIAGNOSIS — E119 Type 2 diabetes mellitus without complications: Secondary | ICD-10-CM | POA: Diagnosis not present

## 2022-09-24 DIAGNOSIS — I110 Hypertensive heart disease with heart failure: Secondary | ICD-10-CM | POA: Diagnosis not present

## 2022-09-24 DIAGNOSIS — Z7901 Long term (current) use of anticoagulants: Secondary | ICD-10-CM | POA: Diagnosis not present

## 2022-09-24 DIAGNOSIS — Z8505 Personal history of malignant neoplasm of liver: Secondary | ICD-10-CM | POA: Diagnosis not present

## 2022-09-24 DIAGNOSIS — D72819 Decreased white blood cell count, unspecified: Secondary | ICD-10-CM | POA: Diagnosis not present

## 2022-09-24 DIAGNOSIS — I5032 Chronic diastolic (congestive) heart failure: Secondary | ICD-10-CM | POA: Diagnosis not present

## 2022-09-24 DIAGNOSIS — N179 Acute kidney failure, unspecified: Secondary | ICD-10-CM | POA: Diagnosis not present

## 2022-09-24 DIAGNOSIS — Z794 Long term (current) use of insulin: Secondary | ICD-10-CM | POA: Diagnosis not present

## 2022-09-24 DIAGNOSIS — D696 Thrombocytopenia, unspecified: Secondary | ICD-10-CM | POA: Diagnosis not present

## 2022-09-24 DIAGNOSIS — E782 Mixed hyperlipidemia: Secondary | ICD-10-CM | POA: Diagnosis not present

## 2022-09-24 NOTE — Telephone Encounter (Signed)
Lvm to schedule ED follow up-Toni 

## 2022-09-28 DIAGNOSIS — E782 Mixed hyperlipidemia: Secondary | ICD-10-CM | POA: Diagnosis not present

## 2022-09-28 DIAGNOSIS — G9341 Metabolic encephalopathy: Secondary | ICD-10-CM | POA: Diagnosis not present

## 2022-09-28 DIAGNOSIS — D696 Thrombocytopenia, unspecified: Secondary | ICD-10-CM | POA: Diagnosis not present

## 2022-09-28 DIAGNOSIS — Z8505 Personal history of malignant neoplasm of liver: Secondary | ICD-10-CM | POA: Diagnosis not present

## 2022-09-28 DIAGNOSIS — I110 Hypertensive heart disease with heart failure: Secondary | ICD-10-CM | POA: Diagnosis not present

## 2022-09-28 DIAGNOSIS — D72819 Decreased white blood cell count, unspecified: Secondary | ICD-10-CM | POA: Diagnosis not present

## 2022-09-28 DIAGNOSIS — E119 Type 2 diabetes mellitus without complications: Secondary | ICD-10-CM | POA: Diagnosis not present

## 2022-09-28 DIAGNOSIS — Z7984 Long term (current) use of oral hypoglycemic drugs: Secondary | ICD-10-CM | POA: Diagnosis not present

## 2022-09-28 DIAGNOSIS — I480 Paroxysmal atrial fibrillation: Secondary | ICD-10-CM | POA: Diagnosis not present

## 2022-09-28 DIAGNOSIS — Z7901 Long term (current) use of anticoagulants: Secondary | ICD-10-CM | POA: Diagnosis not present

## 2022-09-28 DIAGNOSIS — Z794 Long term (current) use of insulin: Secondary | ICD-10-CM | POA: Diagnosis not present

## 2022-09-28 DIAGNOSIS — N179 Acute kidney failure, unspecified: Secondary | ICD-10-CM | POA: Diagnosis not present

## 2022-09-28 DIAGNOSIS — I5032 Chronic diastolic (congestive) heart failure: Secondary | ICD-10-CM | POA: Diagnosis not present

## 2022-09-29 ENCOUNTER — Telehealth: Payer: Self-pay

## 2022-09-29 ENCOUNTER — Inpatient Hospital Stay
Admission: EM | Admit: 2022-09-29 | Discharge: 2022-10-02 | DRG: 872 | Disposition: A | Discharge status: Home Health | Payer: 59 | Attending: Internal Medicine | Admitting: Internal Medicine

## 2022-09-29 ENCOUNTER — Emergency Department: Payer: 59

## 2022-09-29 DIAGNOSIS — K7030 Alcoholic cirrhosis of liver without ascites: Secondary | ICD-10-CM | POA: Diagnosis present

## 2022-09-29 DIAGNOSIS — I959 Hypotension, unspecified: Secondary | ICD-10-CM | POA: Diagnosis not present

## 2022-09-29 DIAGNOSIS — Z794 Long term (current) use of insulin: Secondary | ICD-10-CM

## 2022-09-29 DIAGNOSIS — Z7901 Long term (current) use of anticoagulants: Secondary | ICD-10-CM

## 2022-09-29 DIAGNOSIS — I130 Hypertensive heart and chronic kidney disease with heart failure and stage 1 through stage 4 chronic kidney disease, or unspecified chronic kidney disease: Secondary | ICD-10-CM | POA: Diagnosis present

## 2022-09-29 DIAGNOSIS — E871 Hypo-osmolality and hyponatremia: Secondary | ICD-10-CM | POA: Insufficient documentation

## 2022-09-29 DIAGNOSIS — Z8505 Personal history of malignant neoplasm of liver: Secondary | ICD-10-CM

## 2022-09-29 DIAGNOSIS — E11649 Type 2 diabetes mellitus with hypoglycemia without coma: Secondary | ICD-10-CM | POA: Diagnosis not present

## 2022-09-29 DIAGNOSIS — Z66 Do not resuscitate: Secondary | ICD-10-CM | POA: Diagnosis not present

## 2022-09-29 DIAGNOSIS — N390 Urinary tract infection, site not specified: Secondary | ICD-10-CM | POA: Diagnosis not present

## 2022-09-29 DIAGNOSIS — Z7984 Long term (current) use of oral hypoglycemic drugs: Secondary | ICD-10-CM

## 2022-09-29 DIAGNOSIS — A4151 Sepsis due to Escherichia coli [E. coli]: Secondary | ICD-10-CM | POA: Diagnosis present

## 2022-09-29 DIAGNOSIS — I10 Essential (primary) hypertension: Secondary | ICD-10-CM | POA: Diagnosis not present

## 2022-09-29 DIAGNOSIS — N1831 Chronic kidney disease, stage 3a: Secondary | ICD-10-CM | POA: Insufficient documentation

## 2022-09-29 DIAGNOSIS — E1122 Type 2 diabetes mellitus with diabetic chronic kidney disease: Secondary | ICD-10-CM | POA: Diagnosis present

## 2022-09-29 DIAGNOSIS — G40909 Epilepsy, unspecified, not intractable, without status epilepticus: Secondary | ICD-10-CM | POA: Diagnosis present

## 2022-09-29 DIAGNOSIS — I4891 Unspecified atrial fibrillation: Secondary | ICD-10-CM | POA: Diagnosis present

## 2022-09-29 DIAGNOSIS — E785 Hyperlipidemia, unspecified: Secondary | ICD-10-CM | POA: Diagnosis not present

## 2022-09-29 DIAGNOSIS — F17290 Nicotine dependence, other tobacco product, uncomplicated: Secondary | ICD-10-CM | POA: Diagnosis present

## 2022-09-29 DIAGNOSIS — R531 Weakness: Secondary | ICD-10-CM | POA: Diagnosis not present

## 2022-09-29 DIAGNOSIS — E1165 Type 2 diabetes mellitus with hyperglycemia: Secondary | ICD-10-CM

## 2022-09-29 DIAGNOSIS — A419 Sepsis, unspecified organism: Secondary | ICD-10-CM | POA: Diagnosis present

## 2022-09-29 DIAGNOSIS — D72819 Decreased white blood cell count, unspecified: Secondary | ICD-10-CM | POA: Diagnosis not present

## 2022-09-29 DIAGNOSIS — Z79899 Other long term (current) drug therapy: Secondary | ICD-10-CM | POA: Diagnosis not present

## 2022-09-29 DIAGNOSIS — N179 Acute kidney failure, unspecified: Secondary | ICD-10-CM | POA: Diagnosis not present

## 2022-09-29 DIAGNOSIS — F32A Depression, unspecified: Secondary | ICD-10-CM | POA: Diagnosis present

## 2022-09-29 DIAGNOSIS — R197 Diarrhea, unspecified: Secondary | ICD-10-CM | POA: Diagnosis present

## 2022-09-29 DIAGNOSIS — R000 Tachycardia, unspecified: Secondary | ICD-10-CM | POA: Diagnosis not present

## 2022-09-29 DIAGNOSIS — F419 Anxiety disorder, unspecified: Secondary | ICD-10-CM | POA: Diagnosis present

## 2022-09-29 DIAGNOSIS — I110 Hypertensive heart disease with heart failure: Secondary | ICD-10-CM | POA: Diagnosis not present

## 2022-09-29 DIAGNOSIS — Z886 Allergy status to analgesic agent status: Secondary | ICD-10-CM

## 2022-09-29 DIAGNOSIS — K529 Noninfective gastroenteritis and colitis, unspecified: Secondary | ICD-10-CM | POA: Diagnosis present

## 2022-09-29 DIAGNOSIS — I5032 Chronic diastolic (congestive) heart failure: Secondary | ICD-10-CM | POA: Diagnosis not present

## 2022-09-29 DIAGNOSIS — E876 Hypokalemia: Secondary | ICD-10-CM | POA: Diagnosis not present

## 2022-09-29 DIAGNOSIS — Z8249 Family history of ischemic heart disease and other diseases of the circulatory system: Secondary | ICD-10-CM | POA: Diagnosis not present

## 2022-09-29 DIAGNOSIS — N170 Acute kidney failure with tubular necrosis: Secondary | ICD-10-CM | POA: Diagnosis not present

## 2022-09-29 DIAGNOSIS — I480 Paroxysmal atrial fibrillation: Secondary | ICD-10-CM | POA: Diagnosis present

## 2022-09-29 DIAGNOSIS — E1159 Type 2 diabetes mellitus with other circulatory complications: Secondary | ICD-10-CM | POA: Diagnosis present

## 2022-09-29 DIAGNOSIS — G4700 Insomnia, unspecified: Secondary | ICD-10-CM | POA: Diagnosis present

## 2022-09-29 DIAGNOSIS — E1129 Type 2 diabetes mellitus with other diabetic kidney complication: Secondary | ICD-10-CM | POA: Diagnosis present

## 2022-09-29 DIAGNOSIS — R0989 Other specified symptoms and signs involving the circulatory and respiratory systems: Secondary | ICD-10-CM | POA: Diagnosis not present

## 2022-09-29 DIAGNOSIS — D696 Thrombocytopenia, unspecified: Secondary | ICD-10-CM | POA: Diagnosis not present

## 2022-09-29 DIAGNOSIS — R6520 Severe sepsis without septic shock: Secondary | ICD-10-CM | POA: Diagnosis not present

## 2022-09-29 DIAGNOSIS — N3000 Acute cystitis without hematuria: Secondary | ICD-10-CM | POA: Diagnosis not present

## 2022-09-29 DIAGNOSIS — A021 Salmonella sepsis: Secondary | ICD-10-CM | POA: Diagnosis not present

## 2022-09-29 DIAGNOSIS — E162 Hypoglycemia, unspecified: Secondary | ICD-10-CM | POA: Insufficient documentation

## 2022-09-29 DIAGNOSIS — B9620 Unspecified Escherichia coli [E. coli] as the cause of diseases classified elsewhere: Secondary | ICD-10-CM | POA: Diagnosis not present

## 2022-09-29 DIAGNOSIS — G9341 Metabolic encephalopathy: Secondary | ICD-10-CM | POA: Diagnosis not present

## 2022-09-29 DIAGNOSIS — Z8711 Personal history of peptic ulcer disease: Secondary | ICD-10-CM

## 2022-09-29 DIAGNOSIS — E782 Mixed hyperlipidemia: Secondary | ICD-10-CM | POA: Diagnosis not present

## 2022-09-29 DIAGNOSIS — E119 Type 2 diabetes mellitus without complications: Secondary | ICD-10-CM | POA: Diagnosis not present

## 2022-09-29 LAB — COMPREHENSIVE METABOLIC PANEL WITH GFR
ALT: 31 U/L (ref 0–44)
AST: 89 U/L — ABNORMAL HIGH (ref 15–41)
Albumin: 2.3 g/dL — ABNORMAL LOW (ref 3.5–5.0)
Alkaline Phosphatase: 57 U/L (ref 38–126)
Anion gap: 11 (ref 5–15)
BUN: 24 mg/dL — ABNORMAL HIGH (ref 8–23)
CO2: 18 mmol/L — ABNORMAL LOW (ref 22–32)
Calcium: 7.3 mg/dL — ABNORMAL LOW (ref 8.9–10.3)
Chloride: 103 mmol/L (ref 98–111)
Creatinine, Ser: 1.68 mg/dL — ABNORMAL HIGH (ref 0.44–1.00)
GFR, Estimated: 33 mL/min — ABNORMAL LOW
Glucose, Bld: 124 mg/dL — ABNORMAL HIGH (ref 70–99)
Potassium: 3.6 mmol/L (ref 3.5–5.1)
Sodium: 132 mmol/L — ABNORMAL LOW (ref 135–145)
Total Bilirubin: 1.9 mg/dL — ABNORMAL HIGH (ref 0.3–1.2)
Total Protein: 7.1 g/dL (ref 6.5–8.1)

## 2022-09-29 LAB — URINALYSIS, ROUTINE W REFLEX MICROSCOPIC
Bilirubin Urine: NEGATIVE
Glucose, UA: NEGATIVE mg/dL
Ketones, ur: NEGATIVE mg/dL
Nitrite: NEGATIVE
Protein, ur: 30 mg/dL — AB
Specific Gravity, Urine: 1.012 (ref 1.005–1.030)
WBC, UA: >50 WBC/hpf (ref 0–5)
pH: 5 (ref 5.0–8.0)

## 2022-09-29 LAB — CBC WITH DIFFERENTIAL/PLATELET
Abs Immature Granulocytes: 0.05 10*3/uL (ref 0.00–0.07)
Basophils Absolute: 0 10*3/uL (ref 0.0–0.1)
Basophils Relative: 0 %
Eosinophils Absolute: 0 10*3/uL (ref 0.0–0.5)
Eosinophils Relative: 0 %
HCT: 37.9 % (ref 36.0–46.0)
Hemoglobin: 12.4 g/dL (ref 12.0–15.0)
Immature Granulocytes: 1 %
Lymphocytes Relative: 8 %
Lymphs Abs: 0.7 10*3/uL (ref 0.7–4.0)
MCH: 31.7 pg (ref 26.0–34.0)
MCHC: 32.7 g/dL (ref 30.0–36.0)
MCV: 96.9 fL (ref 80.0–100.0)
Monocytes Absolute: 1 10*3/uL (ref 0.1–1.0)
Monocytes Relative: 11 %
Neutro Abs: 7.3 10*3/uL (ref 1.7–7.7)
Neutrophils Relative %: 80 %
Platelets: 124 10*3/uL — ABNORMAL LOW (ref 150–400)
RBC: 3.91 MIL/uL (ref 3.87–5.11)
RDW: 14 % (ref 11.5–15.5)
WBC: 9.1 10*3/uL (ref 4.0–10.5)
nRBC: 0 % (ref 0.0–0.2)

## 2022-09-29 LAB — TROPONIN I (HIGH SENSITIVITY): Troponin I (High Sensitivity): 14 ng/L

## 2022-09-29 NOTE — ED Provider Triage Note (Signed)
Emergency Medicine Provider Triage Evaluation Note  Adriana Miller , a 67 y.o. female  was evaluated in triage.  Pt complains of weakness following home pt. EMS found patient hypotensive at 90/58, weak, dizzy. No pain complaints. BP up to 104/62 after fluids.  Review of Systems  Positive: weakness Negative: Fever, cough, HA, CP, abd pain  Physical Exam  BP 104/62 (BP Location: Right Arm)   Pulse (!) 103   Temp 98.1 F (36.7 C) (Oral)   Resp (!) 22   Wt 78.9 kg   SpO2 98%   BMI 28.08 kg/m  Gen:   Awake, no distress   Resp:  Normal effort  MSK:   Moves extremities without difficulty  Other:    Medical Decision Making  Medically screening exam initiated at 3:36 PM.  Appropriate orders placed.  Hester Mates was informed that the remainder of the evaluation will be completed by another provider, this initial triage assessment does not replace that evaluation, and the importance of remaining in the ED until their evaluation is complete.  Labs, xray, urine   Racheal Patches, [place] 09/29/22 1536

## 2022-09-29 NOTE — Telephone Encounter (Signed)
Adriana Miller physical therapy from [facility] called [phone] that pt is not eating last 3 days hardly 2 french fries and very fatigue her vital temp 97.4,BP 88/53 and oxygen 100 and pulse 93 and also her glucose is running fast few day is low  glucose 68 as per DFK advised Adriana Miller that pt need go to ED and Adriana Miller will call her brother to take pt to ED

## 2022-09-29 NOTE — ED Notes (Signed)
Pt ambulated with steady gait with a walker to bathroom

## 2022-09-29 NOTE — ED Triage Notes (Signed)
Pt presents to the ED due to weakness. Pt states the weakness started yesterday morning with a poor appetite. Pt A&Ox4

## 2022-09-30 ENCOUNTER — Other Ambulatory Visit: Payer: Self-pay

## 2022-09-30 DIAGNOSIS — D696 Thrombocytopenia, unspecified: Secondary | ICD-10-CM | POA: Diagnosis present

## 2022-09-30 DIAGNOSIS — A021 Salmonella sepsis: Secondary | ICD-10-CM | POA: Diagnosis not present

## 2022-09-30 DIAGNOSIS — N390 Urinary tract infection, site not specified: Secondary | ICD-10-CM | POA: Diagnosis present

## 2022-09-30 DIAGNOSIS — N3000 Acute cystitis without hematuria: Secondary | ICD-10-CM

## 2022-09-30 DIAGNOSIS — B9620 Unspecified Escherichia coli [E. coli] as the cause of diseases classified elsewhere: Secondary | ICD-10-CM | POA: Diagnosis present

## 2022-09-30 DIAGNOSIS — I480 Paroxysmal atrial fibrillation: Secondary | ICD-10-CM | POA: Diagnosis present

## 2022-09-30 DIAGNOSIS — N170 Acute kidney failure with tubular necrosis: Secondary | ICD-10-CM

## 2022-09-30 DIAGNOSIS — E1122 Type 2 diabetes mellitus with diabetic chronic kidney disease: Secondary | ICD-10-CM | POA: Diagnosis present

## 2022-09-30 DIAGNOSIS — Z8249 Family history of ischemic heart disease and other diseases of the circulatory system: Secondary | ICD-10-CM | POA: Diagnosis not present

## 2022-09-30 DIAGNOSIS — E871 Hypo-osmolality and hyponatremia: Secondary | ICD-10-CM | POA: Diagnosis present

## 2022-09-30 DIAGNOSIS — R6520 Severe sepsis without septic shock: Secondary | ICD-10-CM | POA: Diagnosis present

## 2022-09-30 DIAGNOSIS — R197 Diarrhea, unspecified: Secondary | ICD-10-CM | POA: Diagnosis present

## 2022-09-30 DIAGNOSIS — Z7984 Long term (current) use of oral hypoglycemic drugs: Secondary | ICD-10-CM | POA: Diagnosis not present

## 2022-09-30 DIAGNOSIS — I10 Essential (primary) hypertension: Secondary | ICD-10-CM

## 2022-09-30 DIAGNOSIS — I4891 Unspecified atrial fibrillation: Secondary | ICD-10-CM

## 2022-09-30 DIAGNOSIS — I130 Hypertensive heart and chronic kidney disease with heart failure and stage 1 through stage 4 chronic kidney disease, or unspecified chronic kidney disease: Secondary | ICD-10-CM | POA: Diagnosis present

## 2022-09-30 DIAGNOSIS — Z794 Long term (current) use of insulin: Secondary | ICD-10-CM | POA: Diagnosis not present

## 2022-09-30 DIAGNOSIS — N179 Acute kidney failure, unspecified: Secondary | ICD-10-CM

## 2022-09-30 DIAGNOSIS — G40909 Epilepsy, unspecified, not intractable, without status epilepticus: Secondary | ICD-10-CM | POA: Diagnosis present

## 2022-09-30 DIAGNOSIS — F32A Depression, unspecified: Secondary | ICD-10-CM | POA: Diagnosis present

## 2022-09-30 DIAGNOSIS — E11649 Type 2 diabetes mellitus with hypoglycemia without coma: Secondary | ICD-10-CM | POA: Diagnosis not present

## 2022-09-30 DIAGNOSIS — N1831 Chronic kidney disease, stage 3a: Secondary | ICD-10-CM | POA: Diagnosis present

## 2022-09-30 DIAGNOSIS — E1129 Type 2 diabetes mellitus with other diabetic kidney complication: Secondary | ICD-10-CM | POA: Diagnosis present

## 2022-09-30 DIAGNOSIS — E876 Hypokalemia: Secondary | ICD-10-CM | POA: Diagnosis present

## 2022-09-30 DIAGNOSIS — K7030 Alcoholic cirrhosis of liver without ascites: Secondary | ICD-10-CM | POA: Diagnosis present

## 2022-09-30 DIAGNOSIS — K529 Noninfective gastroenteritis and colitis, unspecified: Secondary | ICD-10-CM | POA: Diagnosis present

## 2022-09-30 DIAGNOSIS — Z7901 Long term (current) use of anticoagulants: Secondary | ICD-10-CM | POA: Diagnosis not present

## 2022-09-30 DIAGNOSIS — I5032 Chronic diastolic (congestive) heart failure: Secondary | ICD-10-CM | POA: Diagnosis present

## 2022-09-30 DIAGNOSIS — E785 Hyperlipidemia, unspecified: Secondary | ICD-10-CM | POA: Diagnosis present

## 2022-09-30 DIAGNOSIS — A419 Sepsis, unspecified organism: Secondary | ICD-10-CM | POA: Diagnosis not present

## 2022-09-30 DIAGNOSIS — Z79899 Other long term (current) drug therapy: Secondary | ICD-10-CM | POA: Diagnosis not present

## 2022-09-30 DIAGNOSIS — Z66 Do not resuscitate: Secondary | ICD-10-CM | POA: Diagnosis present

## 2022-09-30 DIAGNOSIS — A4151 Sepsis due to Escherichia coli [E. coli]: Secondary | ICD-10-CM | POA: Diagnosis present

## 2022-09-30 LAB — BRAIN NATRIURETIC PEPTIDE: B Natriuretic Peptide: 238.7 pg/mL — ABNORMAL HIGH (ref 0.0–100.0)

## 2022-09-30 LAB — APTT: aPTT: 33 seconds (ref 24–36)

## 2022-09-30 LAB — LACTIC ACID, PLASMA: Lactic Acid, Venous: 1.8 mmol/L (ref 0.5–1.9)

## 2022-09-30 LAB — PROTIME-INR
INR: 1.5 — ABNORMAL HIGH (ref 0.8–1.2)
Prothrombin Time: 18.3 seconds — ABNORMAL HIGH (ref 11.4–15.2)

## 2022-09-30 LAB — PROCALCITONIN: Procalcitonin: 2.5 ng/mL

## 2022-09-30 LAB — PHOSPHORUS: Phosphorus: 3.4 mg/dL (ref 2.5–4.6)

## 2022-09-30 LAB — TROPONIN I (HIGH SENSITIVITY): Troponin I (High Sensitivity): 8 ng/L (ref <18)

## 2022-09-30 LAB — AMMONIA: Ammonia: 45 umol/L — ABNORMAL HIGH (ref 9–35)

## 2022-09-30 LAB — GLUCOSE, CAPILLARY
Glucose-Capillary: 135 mg/dL — ABNORMAL HIGH (ref 70–99)
Glucose-Capillary: 192 mg/dL — ABNORMAL HIGH (ref 70–99)
Glucose-Capillary: 154 mg/dL — ABNORMAL HIGH (ref 70–99)
Glucose-Capillary: 93 mg/dL (ref 70–99)

## 2022-09-30 LAB — MAGNESIUM: Magnesium: 1.6 mg/dL — ABNORMAL LOW (ref 1.7–2.4)

## 2022-09-30 MED ORDER — THIAMINE MONONITRATE 100 MG PO TABS
100.0000 mg | ORAL_TABLET | Freq: Every day | ORAL | Status: DC
  Administered 2022-09-30 – 2022-10-02 (×3): 100 mg via ORAL
  Filled 2022-09-30 (×3): qty 1

## 2022-09-30 MED ORDER — LACTULOSE 10 GM/15ML PO SOLN
20.0000 g | Freq: Two times a day (BID) | ORAL | Status: DC
  Administered 2022-10-01: 20 g via ORAL
  Filled 2022-09-30 (×2): qty 30

## 2022-09-30 MED ORDER — METOPROLOL TARTRATE 25 MG PO TABS
25.0000 mg | ORAL_TABLET | Freq: Two times a day (BID) | ORAL | Status: DC
  Administered 2022-09-30 – 2022-10-02 (×4): 25 mg via ORAL
  Filled 2022-09-30 (×4): qty 1

## 2022-09-30 MED ORDER — INSULIN ASPART 100 UNIT/ML IJ SOLN: 0.0000 [IU] | Freq: Every day | INTRAMUSCULAR | Status: DC

## 2022-09-30 MED ORDER — METOPROLOL TARTRATE 5 MG/5ML IV SOLN
5.0000 mg | Freq: Once | INTRAVENOUS | Status: AC
  Administered 2022-09-30: 5 mg via INTRAVENOUS
  Filled 2022-09-30: qty 5

## 2022-09-30 MED ORDER — ESCITALOPRAM OXALATE 10 MG PO TABS
10.0000 mg | ORAL_TABLET | Freq: Every day | ORAL | Status: DC
  Administered 2022-09-30 – 2022-10-02 (×3): 10 mg via ORAL
  Filled 2022-09-30 (×3): qty 1

## 2022-09-30 MED ORDER — LACTATED RINGERS IV BOLUS
500.0000 mL | Freq: Once | INTRAVENOUS | Status: AC
  Administered 2022-09-30: 500 mL via INTRAVENOUS

## 2022-09-30 MED ORDER — ACETAMINOPHEN 325 MG PO TABS: 325.0000 mg | ORAL_TABLET | Freq: Four times a day (QID) | ORAL | Status: DC | PRN

## 2022-09-30 MED ORDER — MIRTAZAPINE 15 MG PO TABS
7.5000 mg | ORAL_TABLET | Freq: Every day | ORAL | Status: DC
  Administered 2022-09-30 – 2022-10-01 (×2): 7.5 mg via ORAL
  Filled 2022-09-30 (×2): qty 1

## 2022-09-30 MED ORDER — INSULIN GLARGINE-YFGN 100 UNIT/ML ~~LOC~~ SOLN
18.0000 [IU] | Freq: Every day | SUBCUTANEOUS | Status: DC
  Administered 2022-09-30: 18 [IU] via SUBCUTANEOUS
  Filled 2022-09-30 (×3): qty 0.18

## 2022-09-30 MED ORDER — SODIUM CHLORIDE 0.9 % IV SOLN
1.0000 g | INTRAVENOUS | Status: DC
  Administered 2022-09-30 – 2022-10-01 (×2): 1 g via INTRAVENOUS
  Filled 2022-09-30 (×2): qty 10

## 2022-09-30 MED ORDER — APIXABAN 5 MG PO TABS
5.0000 mg | ORAL_TABLET | Freq: Two times a day (BID) | ORAL | Status: DC
  Administered 2022-09-30 – 2022-10-02 (×5): 5 mg via ORAL
  Filled 2022-09-30 (×5): qty 1

## 2022-09-30 MED ORDER — INSULIN ASPART 100 UNIT/ML IJ SOLN
0.0000 [IU] | Freq: Three times a day (TID) | INTRAMUSCULAR | Status: DC
  Administered 2022-09-30 – 2022-10-02 (×3): 2 [IU] via SUBCUTANEOUS
  Filled 2022-09-30 (×3): qty 1

## 2022-09-30 MED ORDER — METOPROLOL TARTRATE 25 MG PO TABS
25.0000 mg | ORAL_TABLET | Freq: Once | ORAL | Status: AC
  Administered 2022-09-30: 25 mg via ORAL
  Filled 2022-09-30: qty 1

## 2022-09-30 MED ORDER — SODIUM CHLORIDE 0.9 % IV SOLN
1.0000 g | Freq: Once | INTRAVENOUS | Status: AC
  Administered 2022-09-30: 1 g via INTRAVENOUS
  Filled 2022-09-30: qty 10

## 2022-09-30 MED ORDER — ALLOPURINOL 100 MG PO TABS
100.0000 mg | ORAL_TABLET | Freq: Every day | ORAL | Status: DC
  Administered 2022-09-30 – 2022-10-02 (×3): 100 mg via ORAL
  Filled 2022-09-30 (×3): qty 1

## 2022-09-30 MED ORDER — FOLIC ACID 1 MG PO TABS
1.0000 mg | ORAL_TABLET | Freq: Every day | ORAL | Status: DC
  Administered 2022-09-30 – 2022-10-02 (×3): 1 mg via ORAL
  Filled 2022-09-30 (×3): qty 1

## 2022-09-30 MED ORDER — OYSTER SHELL CALCIUM/D3 500-5 MG-MCG PO TABS
1.0000 | ORAL_TABLET | Freq: Every day | ORAL | Status: DC
  Administered 2022-09-30 – 2022-10-01 (×2): 1 via ORAL
  Filled 2022-09-30 (×2): qty 1

## 2022-09-30 MED ORDER — MIDODRINE HCL 5 MG PO TABS
10.0000 mg | ORAL_TABLET | Freq: Three times a day (TID) | ORAL | Status: DC
  Administered 2022-09-30 – 2022-10-02 (×7): 10 mg via ORAL
  Filled 2022-09-30 (×7): qty 2

## 2022-09-30 MED ORDER — ONDANSETRON HCL 4 MG/2ML IJ SOLN: 4.0000 mg | Freq: Three times a day (TID) | INTRAMUSCULAR | Status: DC | PRN

## 2022-09-30 MED ORDER — MAGNESIUM SULFATE 2 GM/50ML IV SOLN
2.0000 g | Freq: Once | INTRAVENOUS | Status: AC
  Administered 2022-09-30: 2 g via INTRAVENOUS
  Filled 2022-09-30: qty 50

## 2022-09-30 NOTE — ED Notes (Addendum)
Assisted pt in changing urine soiled brief. Yellow socks placed on pt. Stretcher locked & in lowest position, call bell in reach. Pt resting comfortably, lights dimmed per pt's request.

## 2022-09-30 NOTE — H&P (Signed)
History and Physical    Adriana Miller ZOX:[id number] DOB: [birth year] DOA: 09/29/2022  Referring MD/NP/PA:   PCP: Carlean Jews, PA-C   Patient coming from:  The patient is coming from home.     Chief Complaint: dysuria, palpitation  HPI: Adriana Miller is a 67 y.o. female with medical history significant of hypertension, hyperlipidemia, diabetes mellitus, diastolic CHF, gout, depression, CKD-3A, A-fib on Eliquis, alcoholic cirrhosis, thrombocytopenia, hepatocellular carcinoma, who presents with dysuria and palpitation.  Patient states that she has mild dysuria, burning on urination and increased urinary frequency for several days.  Denies fever or chills.  She has generalized weakness, decreased oral intake and poor appetite. Patient does not have nausea, vomiting or abdominal pain, but reports diarrhea with multiple loose stool bowel movement for about 1 week.  Patient does not have chest pain, cough, shortness of breath, but reports palpitation.  Per EMS report, her heart rate was in 110s with soft blood pressure 88/53.  Patient reported that she completely stopped drinking alcohol about 1 month ago.  Data reviewed independently and ED Course: pt was found to have WBC 9.1, lactic acid 11.8, procalcitonin 2.50, troponin level 14 --> 8 worsened renal function, BNP 238, Mg 1.6, K 3.6, Phosphorus 3.4, urinalysis (cloudy appearance, large amount of leukocyte, many bacteria, WBC> 50, squamous cell 11-20), temperature normal, blood pressure 99/68, heart rate 114 -- 97, RR 30 --> 11, oxygen saturation 99% on room air.  Chest x-ray negative. Pt is admitted to PCU as inpatient.   EKG: I have personally reviewed.  Atrial fibrillation, heart rate 119, QTc 497, heart with question, low voltage.   Review of Systems:   General: no fevers, chills, no body weight gain, has poor appetite, has fatigue HEENT: no blurry vision, hearing changes or sore throat Respiratory: no dyspnea, coughing,  wheezing CV: no chest pain, has palpitations GI: no nausea, vomiting, abdominal pain, diarrhea, constipation GU: has dysuria, burning on urination, increased urinary frequency, no hematuria  Ext: has trace leg edema Neuro: no unilateral weakness, numbness, or tingling, no vision change or hearing loss Skin: no rash, no skin tear. MSK: No muscle spasm, no deformity, no limitation of range of movement in spin Heme: No easy bruising.  Travel history: No recent long distant travel.   Allergy:  Allergies  Allergen Reactions   Aspirin Other (See Comments)    Nose bleed    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)    B12 deficiency 04/05/2017   Blood in stool    Cerebral aneurysm    Chronic kidney disease    Diabetes mellitus without complication (HCC)    type 2   Gastric erosion with bleeding    Hepatocellular carcinoma (HCC)    Hyperlipidemia    Hypertension    Lower extremity cellulitis 02/08/2019   Paroxysmal A-fib (HCC)    Seizure disorder [facility])     Past Surgical History:  Procedure Laterality Date   CEREBRAL ANEURYSM REPAIR  1991   COLONOSCOPY WITH PROPOFOL N/A 02/12/2019   Procedure: COLONOSCOPY WITH PROPOFOL;  Surgeon: Toney Reil, MD;  Location: [facility] ENDOSCOPY;  Service: Gastroenterology;  Laterality: N/A;   ESOPHAGOGASTRODUODENOSCOPY (EGD) WITH PROPOFOL N/A 02/12/2019   Procedure: ESOPHAGOGASTRODUODENOSCOPY (EGD) WITH PROPOFOL;  Surgeon: Toney Reil, MD;  Location: [facility] ENDOSCOPY;  Service: Gastroenterology;  Laterality: N/A;   IR ANGIOGRAM SELECTIVE EACH ADDITIONAL VESSEL  09/21/2019   IR ANGIOGRAM SELECTIVE EACH ADDITIONAL VESSEL  09/21/2019  IR ANGIOGRAM SELECTIVE EACH ADDITIONAL VESSEL  09/21/2019   IR ANGIOGRAM SELECTIVE EACH ADDITIONAL VESSEL  09/21/2019   IR ANGIOGRAM SELECTIVE EACH ADDITIONAL VESSEL  10/02/2019   IR ANGIOGRAM VISCERAL SELECTIVE  09/21/2019   IR ANGIOGRAM VISCERAL SELECTIVE  09/21/2019    IR ANGIOGRAM VISCERAL SELECTIVE  10/02/2019   IR EMBO TUMOR ORGAN ISCHEMIA INFARCT INC GUIDE ROADMAPPING  10/02/2019   IR EMBO TUMOR ORGAN ISCHEMIA INFARCT INC GUIDE ROADMAPPING  10/02/2019   IR RADIOLOGIST EVAL & MGMT  08/28/2019   IR RADIOLOGIST EVAL & MGMT  01/30/2020   IR RADIOLOGIST EVAL & MGMT  04/30/2020   IR RADIOLOGIST EVAL & MGMT  07/30/2020   IR RADIOLOGIST EVAL & MGMT  10/28/2020   IR RADIOLOGIST EVAL & MGMT  02/24/2021   IR RADIOLOGIST EVAL & MGMT  06/08/2021   IR RADIOLOGIST EVAL & MGMT  11/24/2021   IR RADIOLOGIST EVAL & MGMT  05/25/2022   IR US GUIDE VASC ACCESS LEFT  09/21/2019   IR US GUIDE VASC ACCESS LEFT  10/02/2019   OPEN REDUCTION INTERNAL FIXATION (ORIF) DISTAL RADIAL FRACTURE Right 08/06/2021   Procedure: OPEN REDUCTION INTERNAL FIXATION (ORIF) DISTAL RADIAL FRACTURE;  Surgeon: Kennedy Bucker, MD;  Location: [facility] ORS;  Service: Orthopedics;  Laterality: Right;    Social History:  reports that she has never smoked. Her smokeless tobacco use includes snuff. She reports current alcohol use of about 14.0 standard drinks of alcohol per week. She reports that she does not use drugs.  Family History:  Family History  Problem Relation Age of Onset   Heart attack Brother 54     Prior to Admission medications   Medication Sig Start Date End Date Taking? Authorizing Provider  acetaminophen (TYLENOL) 325 MG tablet Take 2 tablets (650 mg total) by mouth every 6 (six) hours as needed for mild pain (or Fever >/= 101). 06/19/22   Loyce Dys, MD  Alcohol Swabs (B-D SINGLE USE SWABS REGULAR) PADS USE AS DIRECTED TWICE DAILY 07/26/22   McDonough, Salomon Fick, PA-C  allopurinol (ZYLOPRIM) 100 MG tablet TAKE 1 TABLET BY MOUTH DAILY 05/17/22   McDonough, Salomon Fick, PA-C  Blood Glucose Monitoring Suppl (ACCU-CHEK GUIDE ME) w/Device KIT Use as directed  dx E11.65 07/30/22   McDonough, Salomon Fick, PA-C  Calcium Carb-Cholecalciferol (CALCIUM 500/D) 500-5 MG-MCG TABS Take 1 tab po daily 12/07/21   McDonough,  Salomon Fick, PA-C  Continuous Blood Gluc Receiver (DEXCOM G7 RECEIVER) DEVI Use for continuous blood glucose monitoring  DX E11.65 11/09/21   McDonough, Lauren K, PA-C  Continuous Blood Gluc Sensor (DEXCOM G7 SENSOR) MISC Use as directed for continuous glucose monitoring.  Change sensor every 10 days   E11.65 07/26/22   McDonough, Lauren K, PA-C  ELIQUIS 5 MG TABS tablet TAKE 1 TABLET BY MOUTH TWICE  DAILY 09/20/22   End, Cristal Deer, MD  ergocalciferol (VITAMIN D2) 1.25 MG (50000 UT) capsule Take 1 capsule (50,000 Units total) by mouth once a week. 05/13/22   McDonough, Lauren K, PA-C  escitalopram (LEXAPRO) 10 MG tablet TAKE 1 TABLET BY MOUTH DAILY FOR ANXIETY/SLEEP 07/26/22   McDonough, Salomon Fick, PA-C  folic acid (FOLVITE) 1 MG tablet Take 1 tablet (1 mg total) by mouth daily. 06/20/22   Loyce Dys, MD  furosemide (LASIX) 20 MG tablet TAKE 1 TABLET (20 MG TOTAL) BY MOUTH DAILY. 07/28/22   End, Cristal Deer, MD  glimepiride (AMARYL) 1 MG tablet TAKE 1 TABLET BY MOUTH EVERY DAY WITH BREAKFAST 07/22/22  McDonough, Lauren K, PA-C  glucose blood (ACCU-CHEK GUIDE) test strip Use as instructed twice a day DX E11.65 08/03/22   McDonough, Lauren K, PA-C  insulin glargine, 1 Unit Dial, (TOUJEO SOLOSTAR) 300 UNIT/ML Solostar Pen Inject 28 Units into the skin daily with supper. 07/23/22   McDonough, Salomon Fick, PA-C  Insulin Pen Needle (BD PEN NEEDLE NANO 2ND GEN) 32G X 4 MM MISC USE AS DIRECTED WITH TOUJEO 07/26/22   McDonough, Lauren K, PA-C  metoprolol tartrate (LOPRESSOR) 25 MG tablet Take 1 tablet (25 mg total) by mouth 2 (two) times daily. TAKE 1/2 TABLET BY MOUTH TWICE A DAY 08/24/22   Sunnie Nielsen, DO  mirtazapine (REMERON) 7.5 MG tablet TAKE 1 TABLET BY MOUTH DAILY AT  BEDTIME 07/26/22   McDonough, Lauren K, PA-C  potassium chloride (KLOR-CON) 8 MEQ tablet Take 1 tablet (8 mEq total) by mouth 3 (three) times a week. Monday, Wednesday, Friday 07/28/22   End, Cristal Deer, MD  Safety Lancets 28G MISC Use as directed once  a daily Dx e11.65 07/30/22   McDonough, Salomon Fick, PA-C  spironolactone (ALDACTONE) 25 MG tablet TAKE 1 TABLET BY MOUTH DAILY FOR CIRRHOSIS OF LIVER 08/09/22   McDonough, Salomon Fick, PA-C  thiamine (VITAMIN B-1) 100 MG tablet Take 1 tablet (100 mg total) by mouth daily. 06/20/22   Loyce Dys, MD  traZODone (DESYREL) 50 MG tablet TAKE 1 TO 2 TABLETS BY MOUTH AT  BEDTIME FOR INSOMNIA Patient taking differently: Take 50-100 mg by mouth at bedtime. 05/24/22   Carlean Jews, PA-C    Physical Exam: Vitals:   09/30/22 0518 09/30/22 0634 09/30/22 0758 09/30/22 1642  BP:  129/72 99/68 121/81  Pulse:  (!) 105 97 98  Resp:  18 18 17   Temp: 98.1 F (36.7 C) 98.3 F (36.8 C) 98.3 F (36.8 C) 98.1 F (36.7 C)  TempSrc: Oral Oral Oral   SpO2:  99% 99% 100%  Weight:       General: Not in acute distress HEENT:       Eyes: PERRL, EOMI, no jaundice       ENT: No discharge from the ears and nose, no pharynx injection, no tonsillar enlargement.        Neck: No JVD, no bruit, no mass felt. Heme: No neck lymph node enlargement. Cardiac: S1/S2, irregularly irregular rhythm,, No murmurs, No gallops or rubs. Respiratory: No rales, wheezing, rhonchi or rubs. GI: Soft, nondistended, nontender, no rebound pain, no organomegaly, BS present. GU: No hematuria Exts: has trace leg edema bilaterally. 1+DP/PT pulse bilaterally. Musculoskeletal: No joint deformities, No joint redness or warmth, no limitation of ROM in spin. Skin: No rashes.  Neuro: Alert, oriented X3, cranial nerves II-XII grossly intact, moves all extremities normally. Psych: Patient is not psychotic, no suicidal or hemocidal ideation.  Labs on Admission: I have personally reviewed following labs and imaging studies  CBC: Recent Labs  Lab 09/29/22 1537  WBC 9.1  NEUTROABS 7.3  HGB 12.4  HCT 37.9  MCV 96.9  PLT 124*   Basic Metabolic Panel: Recent Labs  Lab 09/29/22 1621 09/30/22 0935  NA 132*  --   K 3.6  --   CL 103  --    CO2 18*  --   GLUCOSE 124*  --   BUN 24*  --   CREATININE 1.68*  --   CALCIUM 7.3*  --   MG  --  1.6*  PHOS  --  3.4   GFR: Estimated Creatinine Clearance:  33.9 mL/min (A) (by C-G formula based on SCr of 1.68 mg/dL (H)). Liver Function Tests: Recent Labs  Lab 09/29/22 1621  AST 89*  ALT 31  ALKPHOS 57  BILITOT 1.9*  PROT 7.1  ALBUMIN 2.3*   No results for input(s): "LIPASE", "AMYLASE" in the last 168 hours. Recent Labs  Lab 09/30/22 0935  AMMONIA 45*   Coagulation Profile: Recent Labs  Lab 09/30/22 0935  INR 1.5*   Cardiac Enzymes: No results for input(s): "CKTOTAL", "CKMB", "CKMBINDEX", "TROPONINI" in the last 168 hours. BNP (last 3 results) No results for input(s): "PROBNP" in the last 8760 hours. HbA1C: No results for input(s): "HGBA1C" in the last 72 hours. CBG: Recent Labs  Lab 09/30/22 0902 09/30/22 1130 09/30/22 1637  GLUCAP 135* 192* 154*   Lipid Profile: No results for input(s): "CHOL", "HDL", "LDLCALC", "TRIG", "CHOLHDL", "LDLDIRECT" in the last 72 hours. Thyroid Function Tests: No results for input(s): "TSH", "T4TOTAL", "FREET4", "T3FREE", "THYROIDAB" in the last 72 hours. Anemia Panel: No results for input(s): "VITAMINB12", "FOLATE", "FERRITIN", "TIBC", "IRON", "RETICCTPCT" in the last 72 hours. Urine analysis:    Component Value Date/Time   COLORURINE YELLOW (A) 09/29/2022 2215   APPEARANCEUR CLOUDY (A) 09/29/2022 2215   APPEARANCEUR Hazy 10/11/2013 1705   LABSPEC 1.012 09/29/2022 2215   LABSPEC 1.012 10/11/2013 1705   PHURINE 5.0 09/29/2022 2215   GLUCOSEU NEGATIVE 09/29/2022 2215   GLUCOSEU >=500 10/11/2013 1705   HGBUR MODERATE (A) 09/29/2022 2215   BILIRUBINUR NEGATIVE 09/29/2022 2215   BILIRUBINUR Negative 10/11/2013 1705   KETONESUR NEGATIVE 09/29/2022 2215   PROTEINUR 30 (A) 09/29/2022 2215   NITRITE NEGATIVE 09/29/2022 2215   LEUKOCYTESUR LARGE (A) 09/29/2022 2215   LEUKOCYTESUR 1+ 10/11/2013 1705   Sepsis  Labs: @LABRCNTIP (procalcitonin:4,lacticidven:4) )No results found for this or any previous visit (from the past 240 hour(s)).   Radiological Exams on Admission: DG Chest 2 View  Result Date: 09/29/2022 CLINICAL DATA:  Weakness, poor appetite EXAM: CHEST - 2 VIEW COMPARISON:  09/22/2022 FINDINGS: Cardiac and mediastinal contours are within normal limits given AP technique. Aortic atherosclerosis. No focal pulmonary opacity. No pleural effusion or pneumothorax. No acute osseous abnormality. IMPRESSION: No acute cardiopulmonary process. Electronically Signed   By: Wiliam Ke M.D.   On: 09/29/2022 16:12      Assessment/Plan Principal Problem:   UTI (urinary tract infection) Active Problems:   Sepsis (HCC)   Atrial fibrillation with RVR (HCC)   Acute renal failure superimposed on stage 3a chronic kidney disease (HCC)   Alcoholic cirrhosis of liver without ascites (HCC)   Essential hypertension   Thrombocytopenia (HCC)   Chronic heart failure with preserved ejection fraction (HFpEF) (HCC)   Type II diabetes mellitus with renal manifestations (HCC)   Diarrhea   Hypomagnesemia   Depression   Assessment and Plan:  Sepsis due to UTI (urinary tract infection): pt meets criteria for sepsis with heart rate up to 114 and RR up to 30.  Patient had hypotension which of 88/53, has persistent soft blood pressure.  Patient has worsening renal function.  Lactic acid 1.8, procalcitonin 2.50  -will admit to tele bed as inpt. -IV Rocephin -f/u urine culture -IV fluid: 500 cc normal saline x 3 doses -Started midodrine 10 mg 3 times daily due to softer blood pressure  Atrial fibrillation with RVR (HCC): HR is up to 119 by EKG -continue home metoprolol 25 mg twice daily -Eliquis  Acute renal failure superimposed on stage 3a chronic kidney disease (HCC): Baseline creatinine 1.16 on  09/22/2022.  Her creatinine is 1.68, BUN 24, GFR 33.  Alcoholic cirrhosis of liver without ascites (HCC): ammonia is  mildly elevated at 45.  Mental status normal. -start lactulose 20 g bid  Essential hypertension --hold Lasix, spironolactone due to softer blood pressure -Patient is on metoprolol which is for A-fib  Thrombocytopenia [facility]): Platelet 124, likely due to liver cirrhosis -Follow-up with CBC  Chronic heart failure with preserved ejection fraction (HFpEF) (HCC): 2D echo on 08/22/2022 showed EF of 59.6%.  Patient has trace leg edema, no JVD.  CHF seem to be compensated.  BNP is elevated at 338. -Probably secondary to spironolactone due to soft blood pressure  Type II diabetes mellitus with renal manifestations (HCC): A1c 7.2.  Poorly controlled.  Thank you patient is taking Amaryl, glargine insulin 23 units daily. -Collateral insulin 18 units daily -Sliding scale insulin  Diarrhea -Check celiac  Hypomagnesemia: Magnesium 1.6, potassium 3.6, phosphorus 3.4 -2 g of magnesium sulfate  Depression -Continue home medications    DVT ppx: on Eliquis  Code Status: Full code   Family Communication: Yes, patient's husband by phone  Disposition Plan:  Anticipate discharge back to previous environment  Consults called:  none  Admission status and Level of care: Progressive:    as inpt       Dispo: The patient is from: Home              Anticipated d/c is to: Home              Anticipated d/c date is: 2 days              Patient currently is not medically stable to d/c.    Severity of Illness:  The appropriate patient status for this patient is INPATIENT. Inpatient status is judged to be reasonable and necessary in order to provide the required intensity of service to ensure the patient's safety. The patient's presenting symptoms, physical exam findings, and initial radiographic and laboratory data in the context of their chronic comorbidities is felt to place them at high risk for further clinical deterioration. Furthermore, it is not anticipated that the patient will be medically stable  for discharge from the hospital within 2 midnights of admission.   * I certify that at the point of admission it is my clinical judgment that the patient will require inpatient hospital care spanning beyond 2 midnights from the point of admission due to high intensity of service, high risk for further deterioration and high frequency of surveillance required.*       Date of Service 09/30/2022    Lorretta Harp [facility]   If 7PM-7AM, please contact night-coverage [email or web address] 09/30/2022, 5:40 PM

## 2022-09-30 NOTE — ED Provider Notes (Signed)
[facility] Provider Note    Event Date/Time   First MD Initiated Contact with Patient 09/29/22 2303     (approximate)   History   Weakness   HPI  Adriana Miller is a 67 y.o. female past medical history of alcoholic cirrhosis, atrial fibrillation, CKD, diabetes who presents because of weakness.  Patient tells me she was working with physical therapy today at home and her heart rate became elevated.  This is why she is here tonight.  She does feel like her heart rate gets high when she moves around.  She feels globally weak but nonfocal.  She has had decreased appetite for several days.  Does feel that she is urinating more than normal but denies dysuria urgency.  No fevers or chills.  No chest pain or dyspnea.  She thinks she has been forgetting to take her nighttime medications.     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)    B12 deficiency 04/05/2017   Blood in stool    Cerebral aneurysm    Chronic kidney disease    Diabetes mellitus without complication (HCC)    type 2   Gastric erosion with bleeding    Hepatocellular carcinoma (HCC)    Hyperlipidemia    Hypertension    Lower extremity cellulitis 02/08/2019   Paroxysmal A-fib (HCC)    Seizure disorder [facility])     Patient Active Problem List   Diagnosis Date Noted   Sepsis (HCC) 08/22/2022   AKI (acute kidney injury) (HCC) 08/21/2022   Elevated LFTs 08/21/2022   Thrombocytopenia (HCC) 08/21/2022   History of cerebral aneurysm repair 08/21/2022   Metabolic encephalopathy 08/21/2022   Elevated troponin 08/21/2022   Dehydration 06/18/2022   SIRS due to infectious process with acute organ dysfunction (HCC) 06/17/2022   Bacterial conjunctivitis 06/17/2022   Lactic acidosis 06/17/2022   Paroxysmal atrial fibrillation (HCC) 08/06/2021   Shaking 08/06/2021   Hepatic cirrhosis (HCC) 04/30/2020   Hepatocellular carcinoma (HCC) 01/08/2020   Left  arm weakness 07/25/2019   Atrial fibrillation with RVR (HCC) 07/25/2019   Hypotension 04/20/2019   Alcoholic cirrhosis of liver without ascites (HCC)    Chronic heart failure with preserved ejection fraction (HFpEF) (HCC) 01/15/2019   Dyspnea on exertion 10/10/2018   Persistent atrial fibrillation (HCC) 10/10/2018   Diastolic dysfunction 10/10/2018   Acute upper respiratory infection 09/07/2017   Essential hypertension 09/07/2017   Type 2 diabetes mellitus (HCC) 04/05/2017   Weakness 04/05/2017   Mixed hyperlipidemia 04/05/2017   Alcohol abuse with other alcohol-induced disorder (HCC) 04/05/2017   B12 deficiency 04/05/2017   History of seizure 05/20/2015   Brain aneurysm 05/20/2015     Physical Exam  Triage Vital Signs: ED Triage Vitals  Enc Vitals Group     BP 09/29/22 1533 104/62     Pulse Rate 09/29/22 1533 (!) 103     Resp 09/29/22 1533 (!) 22     Temp 09/29/22 1533 98.1 F (36.7 C)     Temp Source 09/29/22 1533 Oral     SpO2 09/29/22 1533 98 %     Weight 09/29/22 1534 173 lb 15.1 oz (78.9 kg)     Height --      Head Circumference --      Peak Flow --      Pain Score 09/29/22 1534 0     Pain Loc --      Pain Edu? --  Excl. in GC? --     Most recent vital signs: Vitals:   09/30/22 0329 09/30/22 0330  BP:  133/82  Pulse:  96  Resp:  20  Temp: 98.2 F (36.8 C)   SpO2:  98%     General: Awake, no distress, chronically ill-appearing but nontoxic CV:  Good peripheral perfusion.  No peripheral edema Resp:  Normal effort.  Abd:  No distention.  Abdomen is soft nontender Neuro:             Awake, Alert, Oriented x 3  Other:  5 out of 5 strength in bilateral upper and lower extremities   ED Results / Procedures / Treatments  Labs (all labs ordered are listed, but only abnormal results are displayed) Labs Reviewed  CBC WITH DIFFERENTIAL/PLATELET - Abnormal; Notable for the following components:      Result Value   Platelets 124 (*)    All other  components within normal limits  URINALYSIS, ROUTINE W REFLEX MICROSCOPIC - Abnormal; Notable for the following components:   Color, Urine YELLOW (*)    APPearance CLOUDY (*)    Hgb urine dipstick MODERATE (*)    Protein, ur 30 (*)    Leukocytes,Ua LARGE (*)    Bacteria, UA MANY (*)    All other components within normal limits  COMPREHENSIVE METABOLIC PANEL - Abnormal; Notable for the following components:   Sodium 132 (*)    CO2 18 (*)    Glucose, Bld 124 (*)    BUN 24 (*)    Creatinine, Ser 1.68 (*)    Calcium 7.3 (*)    Albumin 2.3 (*)    AST 89 (*)    Total Bilirubin 1.9 (*)    GFR, Estimated 33 (*)    All other components within normal limits  URINE CULTURE  LACTIC ACID, PLASMA  LACTIC ACID, PLASMA  PROCALCITONIN  TROPONIN I (HIGH SENSITIVITY)  TROPONIN I (HIGH SENSITIVITY)     EKG  I reviewed interpreted patient's EKG show which shows atrial fibrillation with RVR normal axis no acute ischemic changes   RADIOLOGY I reviewed and interpreted the CXR which does not show any acute cardiopulmonary process    PROCEDURES:  Critical Care performed: Yes, see critical care procedure note(s)  .1-3 Lead EKG Interpretation  Performed by: Georga Hacking, MD Authorized by: Georga Hacking, MD     Interpretation: abnormal     ECG rate assessment: tachycardic     Rhythm: atrial fibrillation     Ectopy: none     Conduction: normal   .Critical Care  Performed by: Georga Hacking, MD Authorized by: Georga Hacking, MD   Critical care provider statement:    Critical care time (minutes):  30   Critical care was time spent personally by me on the following activities:  Development of treatment plan with patient or surrogate, discussions with consultants, evaluation of patient's response to treatment, examination of patient, ordering and review of laboratory studies, ordering and review of radiographic studies, ordering and performing treatments and interventions,  pulse oximetry, re-evaluation of patient's condition and review of old charts   The patient is on the cardiac monitor to evaluate for evidence of arrhythmia and/or significant heart rate changes.   MEDICATIONS ORDERED IN ED: Medications  cefTRIAXone (ROCEPHIN) 1 g in sodium chloride 0.9 % 100 mL IVPB (0 g Intravenous Stopped 09/30/22 0108)  lactated ringers bolus 500 mL (0 mLs Intravenous Stopped 09/30/22 0215)  metoprolol tartrate (LOPRESSOR) injection 5  mg (5 mg Intravenous Given 09/30/22 0114)  lactated ringers bolus 500 mL (0 mLs Intravenous Stopped 09/30/22 0329)  metoprolol tartrate (LOPRESSOR) tablet 25 mg (25 mg Oral Given 09/30/22 0229)  metoprolol tartrate (LOPRESSOR) injection 5 mg (5 mg Intravenous Given 09/30/22 0346)     IMPRESSION / MDM / ASSESSMENT AND PLAN / ED COURSE  I reviewed the triage vital signs and the nursing notes.                              Patient's presentation is most consistent with acute presentation with potential threat to life or bodily function.  Differential diagnosis includes, but is not limited to, uncontrolled A-fib, dehydration, sepsis, UTI, electrode abnormality, hepatic encephalopathy  Patient is a 67 year old female who presents because of elevated heart rate and generalized weakness.  She has had decreased appetite for the last 3 days some increased urination and today her heart rate was elevated when she was working with PT.  Feels like her heart rate gets high when she moves around but is asymptomatic at rest.  Denies chest pain or dyspnea.  Denies nausea vomiting.  No fevers or chills.  She is urinating more than normal but denies urgency or dysuria.  She is missing her nighttime meds.  On arrival patient is tachypneic and tachycardic but blood pressure is okay.  She looks chronically ill but nontoxic.  Does not appear volume overloaded and actually looks somewhat dry.  Abdominal exam is benign and neurologic exam is nonfocal.  I reviewed  patient's labs which does show AKI creatinine 1.68 from baseline of around 1.1, bicarb is low at 18 but no anion gap.  No leukocytosis.  Troponin is negative.  Urinalysis has greater than 50 white cells and many bacteria.  I have added on a urine culture.  Given patient does have polyuria we will treat with Rocephin.  Suspect patient's A-fib is combination of not taking her nighttime metoprolol dehydration and possible UTI.  Will give a small fluid bolus of 500 cc of LR and a dose of 5 mg of Lopressor and reassess.  Initially after Lopressor fluid bolus patient's heart rate was downtrending in the 90s I then gave her a dose of p.o. metoprolol.  Patient's lactate is negative.  Continue to observe patient heart rate still uptrending intermittently to above 110.  I think that if the patient were to get up and walk around her heart rate would likely increase even more and thus at this point with her underlying weakness UTI and AKI with persistently elevated heart rate do not think that she is appropriate for discharge.  Will discuss with the hospitalist.       FINAL CLINICAL IMPRESSION(S) / ED DIAGNOSES   Final diagnoses:  AKI (acute kidney injury) (HCC)  Atrial fibrillation with RVR (HCC)  Urinary tract infection without hematuria, site unspecified     Rx / DC Orders   ED Discharge Orders     None        Note:  This document was prepared using Dragon voice recognition software and may include unintentional dictation errors.   Georga Hacking, MD 09/30/22 [phone]

## 2022-09-30 NOTE — Plan of Care (Signed)

## 2022-10-01 DIAGNOSIS — A419 Sepsis, unspecified organism: Secondary | ICD-10-CM

## 2022-10-01 DIAGNOSIS — R6520 Severe sepsis without septic shock: Secondary | ICD-10-CM | POA: Diagnosis not present

## 2022-10-01 DIAGNOSIS — N1831 Chronic kidney disease, stage 3a: Secondary | ICD-10-CM | POA: Insufficient documentation

## 2022-10-01 DIAGNOSIS — N3000 Acute cystitis without hematuria: Secondary | ICD-10-CM | POA: Diagnosis not present

## 2022-10-01 DIAGNOSIS — E871 Hypo-osmolality and hyponatremia: Secondary | ICD-10-CM | POA: Insufficient documentation

## 2022-10-01 DIAGNOSIS — E876 Hypokalemia: Secondary | ICD-10-CM | POA: Insufficient documentation

## 2022-10-01 DIAGNOSIS — I4891 Unspecified atrial fibrillation: Secondary | ICD-10-CM | POA: Diagnosis not present

## 2022-10-01 LAB — GLUCOSE, CAPILLARY
Glucose-Capillary: 65 mg/dL — ABNORMAL LOW (ref 70–99)
Glucose-Capillary: 116 mg/dL — ABNORMAL HIGH (ref 70–99)
Glucose-Capillary: 160 mg/dL — ABNORMAL HIGH (ref 70–99)
Glucose-Capillary: 84 mg/dL (ref 70–99)

## 2022-10-01 LAB — CBC
HCT: 32.2 % — ABNORMAL LOW (ref 36.0–46.0)
Hemoglobin: 11 g/dL — ABNORMAL LOW (ref 12.0–15.0)
MCH: 32 pg (ref 26.0–34.0)
MCHC: 34.2 g/dL (ref 30.0–36.0)
MCV: 93.6 fL (ref 80.0–100.0)
Platelets: 159 10*3/uL (ref 150–400)
RBC: 3.44 MIL/uL — ABNORMAL LOW (ref 3.87–5.11)
RDW: 13.9 % (ref 11.5–15.5)
WBC: 10.6 10*3/uL — ABNORMAL HIGH (ref 4.0–10.5)
nRBC: 0 % (ref 0.0–0.2)

## 2022-10-01 LAB — COMPREHENSIVE METABOLIC PANEL
ALT: 25 U/L (ref 0–44)
AST: 60 U/L — ABNORMAL HIGH (ref 15–41)
Albumin: 2.1 g/dL — ABNORMAL LOW (ref 3.5–5.0)
Alkaline Phosphatase: 56 U/L (ref 38–126)
Anion gap: 8 (ref 5–15)
BUN: 22 mg/dL (ref 8–23)
CO2: 22 mmol/L (ref 22–32)
Calcium: 7.8 mg/dL — ABNORMAL LOW (ref 8.9–10.3)
Chloride: 105 mmol/L (ref 98–111)
Creatinine, Ser: 1.38 mg/dL — ABNORMAL HIGH (ref 0.44–1.00)
GFR, Estimated: 42 mL/min — ABNORMAL LOW (ref >60)
Glucose, Bld: 53 mg/dL — ABNORMAL LOW (ref 70–99)
Potassium: 3.2 mmol/L — ABNORMAL LOW (ref 3.5–5.1)
Sodium: 135 mmol/L (ref 135–145)
Total Bilirubin: 1.7 mg/dL — ABNORMAL HIGH (ref 0.3–1.2)
Total Protein: 6.6 g/dL (ref 6.5–8.1)

## 2022-10-01 LAB — URINE CULTURE: Culture: >=100000 — AB

## 2022-10-01 LAB — AMMONIA: Ammonia: 33 umol/L (ref 9–35)

## 2022-10-01 LAB — C DIFFICILE QUICK SCREEN W PCR REFLEX
C Diff antigen: NEGATIVE
C Diff interpretation: NOT DETECTED
C Diff toxin: NEGATIVE

## 2022-10-01 MED ORDER — INSULIN GLARGINE-YFGN 100 UNIT/ML ~~LOC~~ SOLN
8.0000 [IU] | Freq: Every day | SUBCUTANEOUS | Status: DC
  Filled 2022-10-01 (×2): qty 0.08

## 2022-10-01 MED ORDER — POTASSIUM CHLORIDE CRYS ER 20 MEQ PO TBCR
40.0000 meq | EXTENDED_RELEASE_TABLET | ORAL | Status: AC
  Administered 2022-10-01 (×2): 40 meq via ORAL
  Filled 2022-10-01 (×2): qty 2

## 2022-10-01 NOTE — TOC Initial Note (Addendum)
Transition of Care [facility]) - Initial/Assessment Note    Patient Details  Name: Adriana Miller MRN: [id number] Date of Birth: 1955/06/08  Transition of Care [facility]) CM/SW Contact:    Kreg Shropshire, RN Phone Number: 10/01/2022, 9:25 AM  Clinical Narrative:                 Transition of Care [facility]) - Inpatient Brief Assessment   Patient Details  Name: Adriana Miller MRN: [id number] Date of Birth: 09-17-1955  Transition of Care [facility]) CM/SW Contact:    Kreg Shropshire, RN Phone Number: 10/01/2022, 9:25 AM   Clinical Narrative:    Transition of Care Asessment: Insurance and Status: Insurance coverage has been reviewed Patient has primary care physician: Yes Home environment has been reviewed: Pt has a home health aid that comes every day for 2 hours to help with ADLS   Prior/Current Home Services: Current home services (home health aid every day) Pt has cane, walker, shower chair, and BSC Social Determinants of Health Reivew: SDOH reviewed no interventions necessary Readmission risk has been reviewed: Yes Transition of care needs: no transition of care needs at this time         Patient Goals and CMS Choice            Expected Discharge Plan and Services                                              Prior Living Arrangements/Services                       Activities of Daily Living      Permission Sought/Granted                  Emotional Assessment              Admission diagnosis:  AKI (acute kidney injury) (HCC) [N17.9] Atrial fibrillation with RVR (HCC) [I48.91] Urinary tract infection without hematuria, site unspecified [N39.0] Sepsis due to gram-negative UTI (HCC) [A41.50, N39.0] UTI (urinary tract infection) [N39.0] Patient Active Problem List   Diagnosis Date Noted   Hyponatremia 10/01/2022   Hypokalemia 10/01/2022   UTI (urinary tract infection) 09/30/2022   Acute renal failure superimposed on stage 3a chronic  kidney disease (HCC) 09/30/2022   Type II diabetes mellitus with renal manifestations (HCC) 09/30/2022   Depression 09/30/2022   Diarrhea 09/30/2022   Hypomagnesemia 09/30/2022   Sepsis (HCC) 08/22/2022   AKI (acute kidney injury) (HCC) 08/21/2022   Elevated LFTs 08/21/2022   Thrombocytopenia (HCC) 08/21/2022   History of cerebral aneurysm repair 08/21/2022   Metabolic encephalopathy 08/21/2022   Elevated troponin 08/21/2022   Dehydration 06/18/2022   SIRS due to infectious process with acute organ dysfunction (HCC) 06/17/2022   Bacterial conjunctivitis 06/17/2022   Lactic acidosis 06/17/2022   Paroxysmal atrial fibrillation (HCC) 08/06/2021   Shaking 08/06/2021   Hepatic cirrhosis (HCC) 04/30/2020   Hepatocellular carcinoma (HCC) 01/08/2020   Left arm weakness 07/25/2019   Atrial fibrillation with RVR (HCC) 07/25/2019   Hypotension 04/20/2019   Alcoholic cirrhosis of liver without ascites (HCC)    Chronic heart failure with preserved ejection fraction (HFpEF) (HCC) 01/15/2019   Dyspnea on exertion 10/10/2018   Persistent atrial fibrillation (HCC) 10/10/2018   Diastolic dysfunction 10/10/2018   Acute upper respiratory infection 09/07/2017   Essential hypertension 09/07/2017  Type 2 diabetes mellitus (HCC) 04/05/2017   Weakness 04/05/2017   Mixed hyperlipidemia 04/05/2017   Alcohol abuse with other alcohol-induced disorder (HCC) 04/05/2017   B12 deficiency 04/05/2017   History of seizure 05/20/2015   Brain aneurysm 05/20/2015   PCP:  Carlean Jews, PA-C Pharmacy:   [facility] [phone] - [place], [place] - [address] [place] [address] Phone: [phone] Fax: [phone]  [facility]) - [place], [place] - [address] [place] [address] [place] [address] Phone: [phone] Fax: [phone]  [facility] - [place], [place] - [address] [place]  [address] Phone: [phone] Fax: [phone]     Social Determinants of Health (SDOH) Social History: SDOH Screenings   Food Insecurity: No Food Insecurity (08/22/2022)  Housing: Low Risk  (08/22/2022)  Recent Concern: Housing - Medium Risk (06/18/2022)  Transportation Needs: No Transportation Needs (08/22/2022)  Utilities: Not At Risk (08/22/2022)  Alcohol Screen: Low Risk  (05/13/2022)  Depression (PHQ2-9): Low Risk  (05/13/2022)  Tobacco Use: High Risk (09/30/2022)   SDOH Interventions:     Readmission Risk Interventions    10/01/2022    9:15 AM  Readmission Risk Prevention Plan  Transportation Screening Complete  Medication Review (RN Care Manager) Complete  PCP or Specialist appointment within 3-5 days of discharge Complete  HRI or Home Care Consult Complete  SW Recovery Care/Counseling Consult Complete  Palliative Care Screening Not Applicable  Skilled Nursing Facility Not Applicable

## 2022-10-01 NOTE — Progress Notes (Signed)
Mobility Specialist - Progress Note   10/01/22 1000  Mobility  Activity Ambulated with assistance in room;Transferred to/from [facility]  Level of Assistance Minimal assist, patient does 75% or more  Assistive Device None;BSC  Distance Ambulated (ft) 6 ft  Activity Response Tolerated well  $Mobility charge 1 Mobility     Pt sitting in recliner upon arrival, utilizing RA. Pt performed STS with minA, no AD. Ambulated to [facility] d/t BM. Assisted with peri-care and donning LB dressing. Pt left in recliner with alarm set, needs in reach.    Filiberto Pinks Mobility Specialist 10/01/22, 10:35 AM

## 2022-10-01 NOTE — Hospital Course (Signed)
Adriana Miller is a 67 y.o. female with medical history significant of hypertension, hyperlipidemia, diabetes mellitus, diastolic CHF, gout, depression, CKD-3A, A-fib on Eliquis, alcoholic cirrhosis, thrombocytopenia, hepatocellular carcinoma, who presents with dysuria and palpitation.  Patient has abnormal urine, met sepsis criteria with increased heart rate, increased respiratory rate and a soft blood pressure.  Procalcitonin level 2.5.  Patient was started on Rocephin and IV fluid bolus in the emergency room.  Patient also had atrial fibrillation with RVR, heart rate better controlled with restarting home dose of metoprolol.

## 2022-10-01 NOTE — TOC Progression Note (Signed)
Transition of Care [facility]) - Progression Note    Patient Details  Name: Adriana Miller MRN: [id number] Date of Birth: [birth year]  Transition of Care [facility]) CM/SW Contact  Chapman Fitch, RN Phone Number: 10/01/2022, 3:30 PM  Clinical Narrative:     Patient active with [facility] home [facility]. RN, PT, OT  Requested PT eval.  MD aware         Expected Discharge Plan and Services                                               Social Determinants of Health (SDOH) Interventions SDOH Screenings   Food Insecurity: No Food Insecurity (08/22/2022)  Housing: Low Risk  (08/22/2022)  Recent Concern: Housing - Medium Risk (06/18/2022)  Transportation Needs: No Transportation Needs (08/22/2022)  Utilities: Not At Risk (08/22/2022)  Alcohol Screen: Low Risk  (05/13/2022)  Depression (PHQ2-9): Low Risk  (05/13/2022)  Tobacco Use: High Risk (09/30/2022)    Readmission Risk Interventions    10/01/2022    9:15 AM  Readmission Risk Prevention Plan  Transportation Screening Complete  Medication Review (RN Care Manager) Complete  PCP or Specialist appointment within 3-5 days of discharge Complete  HRI or Home Care Consult Complete  SW Recovery Care/Counseling Consult Complete  Palliative Care Screening Not Applicable  Skilled Nursing Facility Not Applicable

## 2022-10-01 NOTE — Progress Notes (Signed)
Hypoglycemic Event  CBG: 65  Treatment: 8 oz juice/soda  Symptoms: None  Follow-up CBG: [id number] CBG Result:82  Possible Reasons for Event: Unknown  Comments/MD notified:    Tali Cleaves N Alli Jasmer

## 2022-10-01 NOTE — Evaluation (Signed)
Physical Therapy Evaluation Patient Details Name: Adriana Miller MRN: [id number] DOB: 06-25-1955 Today's Date: 10/01/2022  History of Present Illness  Pt is a 67 y.o. female presenting to hospital 09/29/22 with weakness and elevated HR; also decreased appetite and some increased urination.  Pt admitted with sepsis d/t UTI, a-fib with RVR, acute renal failure superimposed on stage 3a CKD, thrombocytopenia, and diarrhea.  PMH includes R frontal craniotomy, alcoholic cirrhosis, brain aneurysm, DM, a-fib, seizure disorder, hepatocellular carcinoma, CKD, ORIF distal R radial fx 08/06/21.  Clinical Impression  Prior to hospital admission, pt was ambulatory (no AD use walking in home--holds onto furniture as needed; uses SPC vs rollator in community); has home health aid for about 2 hours/day 7 days a week; lives alone in 1 level home (level entry).  No c/o pain.  Currently pt is modified independent with bed mobility; CGA with transfers; and CGA to ambulate up to 50 feet (trialed walking with RW and no AD use).  Limited distance ambulating d/t SOB (O2 sats >92% on room air during sessions activities).  Pt's HR 103-121 bpm with activity but about 91 bpm at rest.  Nurse updated on pt's vitals/pt's status.  Pt would currently benefit from skilled PT to address noted impairments and functional limitations (see below for any additional details).  Upon hospital discharge, pt would benefit from ongoing therapy.    Recommendations for follow up therapy are one component of a multi-disciplinary discharge planning process, led by the attending physician.  Recommendations may be updated based on patient status, additional functional criteria and insurance authorization.        Assistance Recommended at Discharge Set up Supervision/Assistance  Patient can return home with the following  A little help with walking and/or transfers;A little help with bathing/dressing/bathroom;Assistance with cooking/housework;Assist for  transportation;Help with stairs or ramp for entrance    Equipment Recommendations None recommended by PT (pt has needed DME at home already)  Recommendations for Other Services       Functional Status Assessment Patient has had a recent decline in their functional status and demonstrates the ability to make significant improvements in function in a reasonable and predictable amount of time.     Precautions / Restrictions Precautions Precautions: Fall Restrictions Weight Bearing Restrictions: No      Mobility  Bed Mobility Overal bed mobility: Modified Independent             General bed mobility comments: Semi-supine to/from sitting with mild increased effort/time to perform on own    Transfers Overall transfer level: Needs assistance Equipment used: Rolling walker (2 wheels) Transfers: Sit to/from Stand Sit to Stand: Min guard           General transfer comment: mild increased effort to stand from bed (bed height mildly elevated) and from toilet (with use of grab bar) x1 trial each    Ambulation/Gait Ambulation/Gait assistance: Min guard Gait Distance (Feet):  (20 feet to bathroom; 20 feet with RW and then 30 feet no AD use (50 feet in a row))     Gait velocity: decreased     General Gait Details: mild flexed posture; decreased B LE step height  Stairs            Wheelchair Mobility    Modified Rankin (Stroke Patients Only)       Balance Overall balance assessment: Needs assistance Sitting-balance support: No upper extremity supported, Feet supported Sitting balance-Leahy Scale: Good Sitting balance - Comments: steady reaching within BOS   Standing  balance support: No upper extremity supported, During functional activity Standing balance-Leahy Scale: Good Standing balance comment: no loss of balance ambulating without UE support                             Pertinent Vitals/Pain Pain Assessment Pain Assessment: No/denies pain     Home Living Family/patient expects to be discharged to:: Private residence Living Arrangements: Alone Available Help at Discharge: Family;Available PRN/intermittently;Personal care attendant Type of Home: Apartment (1st floor) Home Access: Level entry       Home Layout: One level Home Equipment: Shower seat;Grab bars - toilet;Cane - single point;Rollator (4 wheels);Grab bars - tub/shower      Prior Function Prior Level of Function : Needs assist;History of Falls (last six months)             Mobility Comments: Pt reports she fell OOB (rolled OOB) about 3 weeks ago when reaching for her phone that had fallen onto the ground.  No AD use ambulating within home (holds onto furniture as needed); uses SPC vs rollator when walking outside.  Has home health aide that comes 7 days a week for about 2 hours at a time (assists with showers, cooking, cleaning, and putting on her socks).       Hand Dominance        Extremity/Trunk Assessment   Upper Extremity Assessment Upper Extremity Assessment: Overall WFL for tasks assessed    Lower Extremity Assessment Lower Extremity Assessment: Generalized weakness       Communication   Communication: No difficulties  Cognition Arousal/Alertness: Awake/alert Behavior During Therapy: WFL for tasks assessed/performed Overall Cognitive Status: Within Functional Limits for tasks assessed                                          General Comments  Pt agreeable to PT session.    Exercises     Assessment/Plan    PT Assessment Patient needs continued PT services  PT Problem List Decreased strength;Decreased activity tolerance;Decreased balance;Decreased mobility;Cardiopulmonary status limiting activity       PT Treatment Interventions DME instruction;Gait training;Functional mobility training;Therapeutic activities;Therapeutic exercise;Balance training;Patient/family education    PT Goals (Current goals can be found  in the Care Plan section)  Acute Rehab PT Goals Patient Stated Goal: to improve strength PT Goal Formulation: With patient Time For Goal Achievement: 10/15/22 Potential to Achieve Goals: Good    Frequency Min 2X/week     Co-evaluation               AM-PAC PT "6 Clicks" Mobility  Outcome Measure Help needed turning from your back to your side while in a flat bed without using bedrails?: None Help needed moving from lying on your back to sitting on the side of a flat bed without using bedrails?: None Help needed moving to and from a bed to a chair (including a wheelchair)?: A Little Help needed standing up from a chair using your arms (e.g., wheelchair or bedside chair)?: A Little Help needed to walk in hospital room?: A Little Help needed climbing 3-5 steps with a railing? : A Little 6 Click Score: 20    End of Session Equipment Utilized During Treatment: Gait belt Activity Tolerance: Patient tolerated treatment well Patient left: in bed;with call bell/phone within reach;with bed alarm set;with nursing/sitter in room Nurse Communication: Mobility  status;Precautions PT Visit Diagnosis: Other abnormalities of gait and mobility (R26.89);Muscle weakness (generalized) (M62.81);History of falling (Z91.81)    Time: 8657-8469 PT Time Calculation (min) (ACUTE ONLY): 20 min   Charges:   PT Evaluation $PT Eval Low Complexity: 1 Low PT Treatments $Therapeutic Activity: 8-22 mins       Hendricks Limes, PT 10/01/22, 5:25 PM

## 2022-10-01 NOTE — Inpatient Diabetes Management (Signed)
Inpatient Diabetes Program Recommendations  AACE/ADA: New Consensus Statement on Inpatient Glycemic Control (2015)  Target Ranges:  Prepandial:   less than 140 mg/dL      Peak postprandial:   less than 180 mg/dL (1-2 hours)      Critically ill patients:  140 - 180 mg/dL    Latest Reference Range & Units 09/30/22 09:02 09/30/22 11:30 09/30/22 16:37 09/30/22 21:27  Glucose-Capillary 70 - 99 mg/dL 161 (H) 096 (H) 045 (H)  2 units Novolog  18 units Semglee @1847  93  (H): Data is abnormally high  Latest Reference Range & Units 10/01/22 08:09  Glucose-Capillary 70 - 99 mg/dL 65 (L)  (L): Data is abnormally low   Admit with: Sepsis due to UTI   History: DM, CKD, CHF  Home DM Meds: Amaryl 1 mg daily       Toujeo 28 units QPM       Dexcom G7 CGM  Current Orders: Novolog Sensitive Correction Scale/ SSI (0-9 units) TID AC + HS     Semglee 18 units QPM    MD- Note Hypoglycemia this AM.    Please consider reducing the Semglee to 14 units QPM (20% reduction)    --Will follow patient during hospitalization--  Ambrose Finland RN, MSN, CDCES Diabetes Coordinator Inpatient Glycemic Control Team Team Pager: [phone] (8a-5p)

## 2022-10-01 NOTE — Progress Notes (Signed)
Progress Note   Patient: Adriana Miller ZOX:[id number] DOB: 1956/02/18 DOA: 09/29/2022     1 DOS: the patient was seen and examined on 10/01/2022   Brief hospital course: Adriana Miller is a 67 y.o. female with medical history significant of hypertension, hyperlipidemia, diabetes mellitus, diastolic CHF, gout, depression, CKD-3A, A-fib on Eliquis, alcoholic cirrhosis, thrombocytopenia, hepatocellular carcinoma, who presents with dysuria and palpitation.  Patient has abnormal urine, met sepsis criteria with increased heart rate, increased respiratory rate and a soft blood pressure.  Procalcitonin level 2.5.  Patient was started on Rocephin and IV fluid bolus in the emergency room.  Patient also had atrial fibrillation with RVR, heart rate better controlled with restarting home dose of metoprolol.   Principal Problem:   UTI (urinary tract infection) Active Problems:   Sepsis (HCC)   Atrial fibrillation with RVR (HCC)   Alcoholic cirrhosis of liver without ascites (HCC)   Essential hypertension   Thrombocytopenia (HCC)   Chronic heart failure with preserved ejection fraction (HFpEF) (HCC)   Type II diabetes mellitus with renal manifestations (HCC)   Diarrhea   Hypomagnesemia   Depression   Hyponatremia   Hypokalemia   Chronic kidney disease, stage 3a (HCC)   Assessment and Plan: Severe sepsis due to UTI (urinary tract infection): pt meets criteria for sepsis with heart rate up to 114 and RR up to 30.  Patient had hypotension which of 88/53, has persistent soft blood pressure.  Patient has worsening renal function.  Lactic acid 1.8, procalcitonin 2.50 Patient met severe sepsis criteria at time of admission.  This does appear to be secondary to UTI, urine culture grew gram-negative rods.  Continue Rocephin for today.  Patient is more hemodynamically stable today.   Atrial fibrillation with RVR (HCC): -continue home metoprolol 25 mg twice daily -Eliquis Heart rate is better controlled  today.  Chronic kidney disease stage IIIa. Hypokalemia. Hypomagnesemia. Acute kidney injury ruled out. Baseline creatinine 1.16 on 09/22/2022.  Her creatinine is 1.68, slightly worsening renal function due to sepsis.  However, does not meet acute kidney injury criteria. Replete potassium, received magnesium yesterday.   Alcoholic cirrhosis of liver without ascites (HCC):  No evidence of hepatic encephalopathy.  Continue to follow.   Essential hypertension On metoprolol, diuretics on hold per  Thrombocytopenia [facility]): Platelet 124, likely due to liver cirrhosis Platelets has normalized.   Chronic heart failure with preserved ejection fraction (HFpEF) (HCC): 2D echo on 08/22/2022 showed EF of 59.6%.  Patient has trace leg edema, no JVD.  CHF seem to be compensated.  BNP is elevated at 338. Still has no evidence of exacerbation of congestive heart failure.  Continue to follow.   Type II diabetes mellitus with renal manifestations (HCC): A1c 7.2.   Patient developed some hypoglycemia, reduce insulin glargine to 8 units.   Diarrhea C. difficile toxin pending.  Depression -Continue home medications        Subjective:  Patient doing much better today, denies any abdominal pain or nausea vomiting.  Physical Exam: Vitals:   09/30/22 2005 10/01/22 0426 10/01/22 0428 10/01/22 0810  BP: 114/75  121/77 (!) 133/90  Pulse: 80  97 87  Resp: 18  18 18   Temp: 98.1 F (36.7 C)  98.3 F (36.8 C) 98.3 F (36.8 C)  TempSrc: Oral     SpO2: 99%  97%   Weight:  80.7 kg     General exam: Appears calm and comfortable  Respiratory system: Clear to auscultation. Respiratory effort normal. Cardiovascular system:  Irregularly irregular. No JVD, murmurs, rubs, gallops or clicks. No pedal edema. Gastrointestinal system: Abdomen is nondistended, soft and nontender. No organomegaly or masses felt. Normal bowel sounds heard. Central nervous system: Alert and oriented. No focal neurological  deficits. Extremities: Symmetric 5 x 5 power. Skin: No rashes, lesions or ulcers Psychiatry: Judgement and insight appear normal. Mood & affect appropriate.    Data Reviewed:  Lab results reviewed.  Family Communication: None  Disposition: Status is: Inpatient Remains inpatient appropriate because: Severity of disease, IV treatment.     Time spent: 35 minutes  Author: Marrion Coy, MD 10/01/2022 1:01 PM  For on call review [email or web address].

## 2022-10-02 DIAGNOSIS — B9620 Unspecified Escherichia coli [E. coli] as the cause of diseases classified elsewhere: Secondary | ICD-10-CM | POA: Diagnosis not present

## 2022-10-02 DIAGNOSIS — A419 Sepsis, unspecified organism: Secondary | ICD-10-CM | POA: Diagnosis not present

## 2022-10-02 DIAGNOSIS — N1831 Chronic kidney disease, stage 3a: Secondary | ICD-10-CM | POA: Diagnosis not present

## 2022-10-02 DIAGNOSIS — N390 Urinary tract infection, site not specified: Secondary | ICD-10-CM

## 2022-10-02 DIAGNOSIS — E162 Hypoglycemia, unspecified: Secondary | ICD-10-CM | POA: Insufficient documentation

## 2022-10-02 LAB — BASIC METABOLIC PANEL
Anion gap: 7 (ref 5–15)
BUN: 18 mg/dL (ref 8–23)
CO2: 23 mmol/L (ref 22–32)
Calcium: 8.6 mg/dL — ABNORMAL LOW (ref 8.9–10.3)
Chloride: 106 mmol/L (ref 98–111)
Creatinine, Ser: 1.32 mg/dL — ABNORMAL HIGH (ref 0.44–1.00)
GFR, Estimated: 44 mL/min — ABNORMAL LOW (ref >60)
Glucose, Bld: 111 mg/dL — ABNORMAL HIGH (ref 70–99)
Potassium: 4.5 mmol/L (ref 3.5–5.1)
Sodium: 136 mmol/L (ref 135–145)

## 2022-10-02 LAB — CBC
HCT: 32.8 % — ABNORMAL LOW (ref 36.0–46.0)
Hemoglobin: 11 g/dL — ABNORMAL LOW (ref 12.0–15.0)
MCH: 31.7 pg (ref 26.0–34.0)
MCHC: 33.5 g/dL (ref 30.0–36.0)
MCV: 94.5 fL (ref 80.0–100.0)
Platelets: 202 10*3/uL (ref 150–400)
RBC: 3.47 MIL/uL — ABNORMAL LOW (ref 3.87–5.11)
RDW: 14.2 % (ref 11.5–15.5)
WBC: 8.1 10*3/uL (ref 4.0–10.5)
nRBC: 0 % (ref 0.0–0.2)

## 2022-10-02 LAB — MAGNESIUM: Magnesium: 1.8 mg/dL (ref 1.7–2.4)

## 2022-10-02 LAB — GLUCOSE, CAPILLARY
Glucose-Capillary: 90 mg/dL (ref 70–99)
Glucose-Capillary: 175 mg/dL — ABNORMAL HIGH (ref 70–99)

## 2022-10-02 LAB — URINE CULTURE

## 2022-10-02 MED ORDER — TOUJEO SOLOSTAR 300 UNIT/ML ~~LOC~~ SOPN
10.0000 [IU] | PEN_INJECTOR | Freq: Every day | SUBCUTANEOUS | 2 refills | Status: DC
Start: 2022-10-02 — End: 2022-11-26

## 2022-10-02 MED ORDER — CEPHALEXIN 500 MG PO CAPS: 500.0000 mg | ORAL_CAPSULE | Freq: Two times a day (BID) | ORAL | 0 refills | Status: AC

## 2022-10-02 NOTE — Progress Notes (Signed)
Hester Mates to be D/C'd Home with home health per MD order.  Discussed with the patient and all questions fully answered.  VSS, Skin clean, dry and intact without evidence of skin break down, no evidence of skin tears noted. IV catheter discontinued intact. Site without signs and symptoms of complications. Dressing and pressure applied.  An After Visit Summary was printed and given to the patient. Patient prescription sent to patient pharmacy.  D/c education completed with patient/family including follow up instructions, medication list, d/c activities limitations if indicated, with other d/c instructions as indicated by MD - patient able to verbalize understanding, all questions fully answered.   Patient instructed to return to ED, call 911, or call MD for any changes in condition.   Patient escorted via WC, and D/C home via private auto.  Pauletta Browns 10/02/2022 11:51 AM

## 2022-10-02 NOTE — Discharge Summary (Signed)
Physician Discharge Summary   Patient: Adriana Miller MRN: [id number] DOB: 08-04-1955  Admit date:     09/29/2022  Discharge date: 10/02/22  Discharge Physician: Marrion Coy   PCP: Carlean Jews, PA-C   Recommendations at discharge:   Follow-up with PCP in 1 week.  Discharge Diagnoses: Principal Problem:   E. coli UTI Active Problems:   Severe sepsis (HCC)   Atrial fibrillation with RVR (HCC)   Alcoholic cirrhosis of liver without ascites (HCC)   Essential hypertension   Thrombocytopenia (HCC)   Chronic heart failure with preserved ejection fraction (HFpEF) (HCC)   Type II diabetes mellitus with renal manifestations (HCC)   Diarrhea   Hypomagnesemia   Depression   Hyponatremia   Hypokalemia   Chronic kidney disease, stage 3a (HCC)   Hypoglycemia  Resolved Problems:   * No resolved hospital problems. *  Hospital Course: Adriana Miller is a 67 y.o. female with medical history significant of hypertension, hyperlipidemia, diabetes mellitus, diastolic CHF, gout, depression, CKD-3A, A-fib on Eliquis, alcoholic cirrhosis, thrombocytopenia, hepatocellular carcinoma, who presents with dysuria and palpitation.  Patient has abnormal urine, met sepsis criteria with increased heart rate, increased respiratory rate and a soft blood pressure.  Procalcitonin level 2.5.  Patient was started on Rocephin and IV fluid bolus in the emergency room.  Patient also had atrial fibrillation with RVR, heart rate better controlled with restarting home dose of metoprolol. Patient was treated with Rocephin, urine culture came back with E. coli, pansensitive.  Patient be treated with Keflex 500 mg twice a day for 7 days based on renal dosing. Assessment and Plan: Severe sepsis due to E. coli UTI (urinary tract infection): E. coli urinary tract infection Pt meets criteria for sepsis with heart rate up to 114 and RR up to 30.  Patient had hypotension which of 88/53, has persistent soft blood  pressure.  Patient has worsening renal function.  Lactic acid 1.8, procalcitonin 2.50 Patient met severe sepsis criteria at time of admission.  This does appear to be secondary to UTI, urine culture grew E Coli.   Patient is treated with Rocephin, changed to to Keflex at discharge.  Atrial fibrillation with RVR (HCC): -continue home metoprolol 25 mg twice daily -Eliquis Heart rate is better controlled.   Chronic kidney disease stage IIIa. Hypokalemia. Hypomagnesemia. Acute kidney injury ruled out. Baseline creatinine 1.16 on 09/22/2022.  Her creatinine is 1.68, slightly worsening renal function due to sepsis.  However, does not meet acute kidney injury criteria. Replete potassium, received magnesium.  Renal function is back to baseline.   Alcoholic cirrhosis of liver without ascites (HCC):  No evidence of hepatic encephalopathy.  Continue to follow.   Essential hypertension Resume home treatment.   Thrombocytopenia (HCC): Platelet 124, likely due to liver cirrhosis Platelets has normalized.   Chronic heart failure with preserved ejection fraction (HFpEF) (HCC): 2D echo on 08/22/2022 showed EF of 59.6%.  Patient has trace leg edema, no JVD.  CHF seem to be compensated.  BNP is elevated at 338. Still has no evidence of exacerbation of congestive heart failure.  Resume home dose diuretics, follow-up with PCP as outpatient.   Type II diabetes mellitus with renal manifestations (HCC) Intermittent hypoglycemia. : A1c 7.2.   Patient developed some hypoglycemia, reduced insulin glargine to 8 units. Discontinue glimepiride.   Diarrhea Condition resolved.   Depression -Continue home medications         Consultants: None Procedures performed: None  Disposition: Home Diet recommendation:  Discharge Diet  Orders (From admission, onward)     Start     Ordered   10/02/22 0000  Diet - low sodium heart healthy        10/02/22 0943           Cardiac diet DISCHARGE  MEDICATION: Allergies as of 10/02/2022       Reactions   Aspirin Other (See Comments)   Nose bleed        Medication List     STOP taking these medications    glimepiride 1 MG tablet Commonly known as: AMARYL       TAKE these medications    Accu-Chek Guide Me w/Device Kit Use as directed  dx E11.65   Accu-Chek Guide test strip Generic drug: glucose blood Use as instructed twice a day DX E11.65   acetaminophen 325 MG tablet Commonly known as: TYLENOL Take 2 tablets (650 mg total) by mouth every 6 (six) hours as needed for mild pain (or Fever >/= 101).   allopurinol 100 MG tablet Commonly known as: ZYLOPRIM TAKE 1 TABLET BY MOUTH DAILY   B-D SINGLE USE SWABS REGULAR Pads USE AS DIRECTED TWICE DAILY   BD Pen Needle Nano 2nd Gen 32G X 4 MM Misc Generic drug: Insulin Pen Needle USE AS DIRECTED WITH TOUJEO   Calcium Carb-Cholecalciferol 500-5 MG-MCG Tabs Commonly known as: Calcium 500/D Take 1 tab po daily   cephALEXin 500 MG capsule Commonly known as: KEFLEX Take 1 capsule (500 mg total) by mouth 2 (two) times daily for 5 days.   Dexcom G7 Receiver Devi Use for continuous blood glucose monitoring  DX E11.65   Dexcom G7 Sensor Misc Use as directed for continuous glucose monitoring.  Change sensor every 10 days   E11.65   Eliquis 5 MG Tabs tablet Generic drug: apixaban TAKE 1 TABLET BY MOUTH TWICE  DAILY   ergocalciferol 1.25 MG (50000 UT) capsule Commonly known as: VITAMIN D2 Take 1 capsule (50,000 Units total) by mouth once a week.   escitalopram 10 MG tablet Commonly known as: LEXAPRO TAKE 1 TABLET BY MOUTH DAILY FOR ANXIETY/SLEEP What changed: See the new instructions.   folic acid 1 MG tablet Commonly known as: FOLVITE Take 1 tablet (1 mg total) by mouth daily.   furosemide 20 MG tablet Commonly known as: LASIX TAKE 1 TABLET (20 MG TOTAL) BY MOUTH DAILY. What changed:  how much to take how to take this when to take this   metoprolol  tartrate 25 MG tablet Commonly known as: LOPRESSOR Take 1 tablet (25 mg total) by mouth 2 (two) times daily. TAKE 1/2 TABLET BY MOUTH TWICE A DAY What changed: additional instructions   mirtazapine 7.5 MG tablet Commonly known as: REMERON TAKE 1 TABLET BY MOUTH DAILY AT  BEDTIME What changed:  how much to take how to take this when to take this   potassium chloride 8 MEQ tablet Commonly known as: KLOR-CON Take 1 tablet (8 mEq total) by mouth 3 (three) times a week. Monday, Wednesday, Friday   Safety Lancets 28G Misc Use as directed once a daily Dx e11.65   spironolactone 25 MG tablet Commonly known as: ALDACTONE TAKE 1 TABLET BY MOUTH DAILY FOR CIRRHOSIS OF LIVER What changed: See the new instructions.   thiamine 100 MG tablet Commonly known as: Vitamin B-1 Take 1 tablet (100 mg total) by mouth daily.   Toujeo SoloStar 300 UNIT/ML Solostar Pen Generic drug: insulin glargine (1 Unit Dial) Inject 10 Units into the skin daily  with supper. What changed: how much to take   traZODone 50 MG tablet Commonly known as: DESYREL TAKE 1 TO 2 TABLETS BY MOUTH AT  BEDTIME FOR INSOMNIA What changed:  how much to take how to take this when to take this additional instructions        Follow-up Information     McDonough, Lauren K, PA-C Follow up in 1 week(s).   Specialty: Physician Assistant Contact information: [phone] [place] [address] [phone]                Discharge Exam: Filed Weights   09/29/22 1534 10/01/22 0426 10/02/22 0500  Weight: 78.9 kg 80.7 kg 80.6 kg   General exam: Appears calm and comfortable  Respiratory system: Clear to auscultation. Respiratory effort normal. Cardiovascular system: S1 & S2 heard, RRR. No JVD, murmurs, rubs, gallops or clicks. No pedal edema. Gastrointestinal system: Abdomen is nondistended, soft and nontender. No organomegaly or masses felt. Normal bowel sounds heard. Central nervous system: Alert and oriented.  No focal neurological deficits. Extremities: Symmetric 5 x 5 power. Skin: No rashes, lesions or ulcers Psychiatry: Judgement and insight appear normal. Mood & affect appropriate.    Condition at discharge: good  The results of significant diagnostics from this hospitalization (including imaging, microbiology, ancillary and laboratory) are listed below for reference.   Imaging Studies: DG Chest 2 View  Result Date: 09/29/2022 CLINICAL DATA:  Weakness, poor appetite EXAM: CHEST - 2 VIEW COMPARISON:  09/22/2022 FINDINGS: Cardiac and mediastinal contours are within normal limits given AP technique. Aortic atherosclerosis. No focal pulmonary opacity. No pleural effusion or pneumothorax. No acute osseous abnormality. IMPRESSION: No acute cardiopulmonary process. Electronically Signed   By: Wiliam Ke M.D.   On: 09/29/2022 16:12   CT HEAD WO CONTRAST ( )  Result Date: 09/23/2022 CLINICAL DATA:  Recent fall with headaches and neck pain, initial encounter EXAM: CT HEAD WITHOUT CONTRAST CT CERVICAL SPINE WITHOUT CONTRAST TECHNIQUE: Multidetector CT imaging of the head and cervical spine was performed following the standard protocol without intravenous contrast. Multiplanar CT image reconstructions of the cervical spine were also generated. RADIATION DOSE REDUCTION: This exam was performed according to the departmental dose-optimization program which includes automated exposure control, adjustment of the mA and/or kV according to patient size and/or use of iterative reconstruction technique. COMPARISON:  06/17/2022 FINDINGS: CT HEAD FINDINGS Brain: No evidence of acute infarction, hemorrhage, hydrocephalus, extra-axial collection or mass lesion/mass effect. Mild atrophic changes are noted. Vascular: No hyperdense vessel or unexpected calcification. Skull: Postsurgical changes are noted on the right. Sinuses/Orbits: No acute finding. Other: None. CT CERVICAL SPINE FINDINGS Alignment: Within normal limits.  Skull base and vertebrae: 7 cervical segments are well visualized. Multilevel osteophytic changes and facet hypertrophic changes are seen. No acute fracture or acute facet abnormality is noted. Soft tissues and spinal canal: Surrounding soft tissue structures are within normal limits. Upper chest: Visualized lung apices are unremarkable. Other: None IMPRESSION: CT of the head: Chronic atrophic changes without acute abnormality. CT of the cervical spine: Multilevel degenerative change without acute abnormality. Electronically Signed   By: Alcide Clever M.D.   On: 09/23/2022 00:06   CT Cervical Spine Wo Contrast  Result Date: 09/23/2022 CLINICAL DATA:  Recent fall with headaches and neck pain, initial encounter EXAM: CT HEAD WITHOUT CONTRAST CT CERVICAL SPINE WITHOUT CONTRAST TECHNIQUE: Multidetector CT imaging of the head and cervical spine was performed following the standard protocol without intravenous contrast. Multiplanar CT image reconstructions of the cervical spine  were also generated. RADIATION DOSE REDUCTION: This exam was performed according to the departmental dose-optimization program which includes automated exposure control, adjustment of the mA and/or kV according to patient size and/or use of iterative reconstruction technique. COMPARISON:  06/17/2022 FINDINGS: CT HEAD FINDINGS Brain: No evidence of acute infarction, hemorrhage, hydrocephalus, extra-axial collection or mass lesion/mass effect. Mild atrophic changes are noted. Vascular: No hyperdense vessel or unexpected calcification. Skull: Postsurgical changes are noted on the right. Sinuses/Orbits: No acute finding. Other: None. CT CERVICAL SPINE FINDINGS Alignment: Within normal limits. Skull base and vertebrae: 7 cervical segments are well visualized. Multilevel osteophytic changes and facet hypertrophic changes are seen. No acute fracture or acute facet abnormality is noted. Soft tissues and spinal canal: Surrounding soft tissue structures  are within normal limits. Upper chest: Visualized lung apices are unremarkable. Other: None IMPRESSION: CT of the head: Chronic atrophic changes without acute abnormality. CT of the cervical spine: Multilevel degenerative change without acute abnormality. Electronically Signed   By: Alcide Clever M.D.   On: 09/23/2022 00:06   DG Chest Portable 1 View  Result Date: 09/22/2022 CLINICAL DATA:  Suspect aspiration.  Fall. EXAM: PORTABLE CHEST 1 VIEW COMPARISON:  Chest x-ray 08/21/2022 FINDINGS: The heart size and mediastinal contours are within normal limits. Both lungs are clear. The visualized skeletal structures are unremarkable. IMPRESSION: No active disease. Electronically Signed   By: Darliss Cheney M.D.   On: 09/22/2022 23:32    Microbiology: Results for orders placed or performed during the hospital encounter of 09/29/22  Urine Culture     Status: Abnormal   Collection Time: 09/29/22 10:15 PM   Specimen: Urine, Clean Catch  Result Value Ref Range Status   Specimen Description   Final    URINE, CLEAN CATCH Performed at [facility], [address], [place], [place] [address]    Special Requests   Final    NONE Performed at [facility], [address] [place], [place] [address]    Culture >=100,000 COLONIES/mL ESCHERICHIA COLI (A)  Final   Report Status 10/02/2022 FINAL  Final   Organism ID, Bacteria ESCHERICHIA COLI (A)  Final      Susceptibility   Escherichia coli - MIC*    AMPICILLIN <=2 SENSITIVE Sensitive     CEFAZOLIN <=4 SENSITIVE Sensitive     CEFEPIME <=0.12 SENSITIVE Sensitive     CEFTRIAXONE <=0.25 SENSITIVE Sensitive     CIPROFLOXACIN <=0.25 SENSITIVE Sensitive     GENTAMICIN <=1 SENSITIVE Sensitive     IMIPENEM <=0.25 SENSITIVE Sensitive     NITROFURANTOIN <=16 SENSITIVE Sensitive     TRIMETH/SULFA <=20 SENSITIVE Sensitive     AMPICILLIN/SULBACTAM <=2 SENSITIVE Sensitive     PIP/TAZO <=4 SENSITIVE Sensitive     * >=100,000 COLONIES/mL ESCHERICHIA  COLI  C Difficile Quick Screen w PCR reflex     Status: None   Collection Time: 10/01/22 12:22 PM   Specimen: STOOL  Result Value Ref Range Status   C Diff antigen NEGATIVE NEGATIVE Final   C Diff toxin NEGATIVE NEGATIVE Final   C Diff interpretation No C. difficile detected.  Final    Comment: Performed at [facility], [address] [place], [place] [address]    Labs: CBC: Recent Labs  Lab 09/29/22 1537 10/01/22 0415 10/02/22 0412  WBC 9.1 10.6* 8.1  NEUTROABS 7.3  --   --   HGB 12.4 11.0* 11.0*  HCT 37.9 32.2* 32.8*  MCV 96.9 93.6 94.5  PLT 124* 159 202  Basic Metabolic Panel: Recent Labs  Lab 09/29/22 1621 09/30/22 0935 10/01/22 0415 10/02/22 0412  NA 132*  --  135 136  K 3.6  --  3.2* 4.5  CL 103  --  105 106  CO2 18*  --  22 23  GLUCOSE 124*  --  53* 111*  BUN 24*  --  22 18  CREATININE 1.68*  --  1.38* 1.32*  CALCIUM 7.3*  --  7.8* 8.6*  MG  --  1.6*  --  1.8  PHOS  --  3.4  --   --    Liver Function Tests: Recent Labs  Lab 09/29/22 1621 10/01/22 0415  AST 89* 60*  ALT 31 25  ALKPHOS 57 56  BILITOT 1.9* 1.7*  PROT 7.1 6.6  ALBUMIN 2.3* 2.1*   CBG: Recent Labs  Lab 10/01/22 0809 10/01/22 1148 10/01/22 1628 10/01/22 2129 10/02/22 0829  GLUCAP 65* 116* 160* 84 90    Discharge time spent: greater than 30 minutes.  Signed: Marrion Coy, MD [facility] 10/02/2022

## 2022-10-04 ENCOUNTER — Telehealth: Payer: Self-pay | Admitting: Physician Assistant

## 2022-10-04 DIAGNOSIS — I480 Paroxysmal atrial fibrillation: Secondary | ICD-10-CM | POA: Diagnosis not present

## 2022-10-04 DIAGNOSIS — N179 Acute kidney failure, unspecified: Secondary | ICD-10-CM | POA: Diagnosis not present

## 2022-10-04 DIAGNOSIS — E119 Type 2 diabetes mellitus without complications: Secondary | ICD-10-CM | POA: Diagnosis not present

## 2022-10-04 DIAGNOSIS — E782 Mixed hyperlipidemia: Secondary | ICD-10-CM | POA: Diagnosis not present

## 2022-10-04 DIAGNOSIS — D72819 Decreased white blood cell count, unspecified: Secondary | ICD-10-CM | POA: Diagnosis not present

## 2022-10-04 DIAGNOSIS — Z7984 Long term (current) use of oral hypoglycemic drugs: Secondary | ICD-10-CM | POA: Diagnosis not present

## 2022-10-04 DIAGNOSIS — Z8505 Personal history of malignant neoplasm of liver: Secondary | ICD-10-CM | POA: Diagnosis not present

## 2022-10-04 DIAGNOSIS — I110 Hypertensive heart disease with heart failure: Secondary | ICD-10-CM | POA: Diagnosis not present

## 2022-10-04 DIAGNOSIS — Z7901 Long term (current) use of anticoagulants: Secondary | ICD-10-CM | POA: Diagnosis not present

## 2022-10-04 DIAGNOSIS — I5032 Chronic diastolic (congestive) heart failure: Secondary | ICD-10-CM | POA: Diagnosis not present

## 2022-10-04 DIAGNOSIS — G9341 Metabolic encephalopathy: Secondary | ICD-10-CM | POA: Diagnosis not present

## 2022-10-04 DIAGNOSIS — D696 Thrombocytopenia, unspecified: Secondary | ICD-10-CM | POA: Diagnosis not present

## 2022-10-04 DIAGNOSIS — Z794 Long term (current) use of insulin: Secondary | ICD-10-CM | POA: Diagnosis not present

## 2022-10-04 NOTE — Telephone Encounter (Signed)
Lvm to schedule HF-Toni 

## 2022-10-05 ENCOUNTER — Other Ambulatory Visit: Payer: Self-pay | Admitting: Physician Assistant

## 2022-10-12 ENCOUNTER — Ambulatory Visit (INDEPENDENT_AMBULATORY_CARE_PROVIDER_SITE_OTHER): Payer: 59 | Admitting: Nurse Practitioner

## 2022-10-12 ENCOUNTER — Encounter: Payer: Self-pay | Admitting: Nurse Practitioner

## 2022-10-12 VITALS — BP 100/60 | HR 70 | Temp 97.6°F | Resp 16 | Ht 66.0 in | Wt 173.4 lb

## 2022-10-12 DIAGNOSIS — I480 Paroxysmal atrial fibrillation: Secondary | ICD-10-CM | POA: Diagnosis not present

## 2022-10-12 DIAGNOSIS — N390 Urinary tract infection, site not specified: Secondary | ICD-10-CM | POA: Diagnosis not present

## 2022-10-12 DIAGNOSIS — Z09 Encounter for follow-up examination after completed treatment for conditions other than malignant neoplasm: Secondary | ICD-10-CM

## 2022-10-12 DIAGNOSIS — B9620 Unspecified Escherichia coli [E. coli] as the cause of diseases classified elsewhere: Secondary | ICD-10-CM | POA: Diagnosis not present

## 2022-10-12 DIAGNOSIS — Z794 Long term (current) use of insulin: Secondary | ICD-10-CM | POA: Diagnosis not present

## 2022-10-12 DIAGNOSIS — E119 Type 2 diabetes mellitus without complications: Secondary | ICD-10-CM | POA: Diagnosis not present

## 2022-10-12 LAB — POCT CBG (FASTING - GLUCOSE)-MANUAL ENTRY: Glucose Fasting, POC: 168 mg/dL — AB (ref 70–99)

## 2022-10-12 NOTE — Progress Notes (Signed)
[facility] [place], [place] [address] [place] [address] [phone]                                   Transitional Care Clinic   [facility] Discharge Acute Issues Care Follow Up                                                                        Patient Demographics  Adriana Miller, is a 67 y.o. female  DOB [birth year]  MRN [id number].  Primary MD  Carlean Jews, PA-C  Admit date:     09/29/2022  Discharge date: 10/02/22    Reason for TCC follow Up - AKI, AFIB, DM, UTI   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)    B12 deficiency 04/05/2017   Blood in stool    Cerebral aneurysm    Chronic kidney disease    Diabetes mellitus without complication (HCC)    type 2   Gastric erosion with bleeding    Hepatocellular carcinoma (HCC)    Hyperlipidemia    Hypertension    Lower extremity cellulitis 02/08/2019   Paroxysmal A-fib (HCC)    Seizure disorder [facility])     Past Surgical History:  Procedure Laterality Date   CEREBRAL ANEURYSM REPAIR  1991   COLONOSCOPY WITH PROPOFOL N/A 02/12/2019   Procedure: COLONOSCOPY WITH PROPOFOL;  Surgeon: Toney Reil, MD;  Location: [facility] ENDOSCOPY;  Service: Gastroenterology;  Laterality: N/A;   ESOPHAGOGASTRODUODENOSCOPY (EGD) WITH PROPOFOL N/A 02/12/2019   Procedure: ESOPHAGOGASTRODUODENOSCOPY (EGD) WITH PROPOFOL;  Surgeon: Toney Reil, MD;  Location: [facility] ENDOSCOPY;  Service: Gastroenterology;  Laterality: N/A;   IR ANGIOGRAM SELECTIVE EACH ADDITIONAL VESSEL  09/21/2019   IR ANGIOGRAM SELECTIVE EACH ADDITIONAL VESSEL  09/21/2019   IR ANGIOGRAM SELECTIVE EACH ADDITIONAL VESSEL  09/21/2019   IR ANGIOGRAM SELECTIVE EACH ADDITIONAL VESSEL  09/21/2019   IR ANGIOGRAM SELECTIVE EACH ADDITIONAL VESSEL  10/02/2019   IR ANGIOGRAM VISCERAL SELECTIVE  09/21/2019   IR ANGIOGRAM VISCERAL SELECTIVE  09/21/2019   IR ANGIOGRAM VISCERAL SELECTIVE  10/02/2019   IR  EMBO TUMOR ORGAN ISCHEMIA INFARCT INC GUIDE ROADMAPPING  10/02/2019   IR EMBO TUMOR ORGAN ISCHEMIA INFARCT INC GUIDE ROADMAPPING  10/02/2019   IR RADIOLOGIST EVAL & MGMT  08/28/2019   IR RADIOLOGIST EVAL & MGMT  01/30/2020   IR RADIOLOGIST EVAL & MGMT  04/30/2020   IR RADIOLOGIST EVAL & MGMT  07/30/2020   IR RADIOLOGIST EVAL & MGMT  10/28/2020   IR RADIOLOGIST EVAL & MGMT  02/24/2021   IR RADIOLOGIST EVAL & MGMT  06/08/2021   IR RADIOLOGIST EVAL & MGMT  11/24/2021   IR RADIOLOGIST EVAL & MGMT  05/25/2022   IR US GUIDE VASC ACCESS LEFT  09/21/2019   IR US GUIDE VASC ACCESS LEFT  10/02/2019   OPEN REDUCTION INTERNAL FIXATION (ORIF) DISTAL RADIAL FRACTURE Right 08/06/2021   Procedure: OPEN REDUCTION INTERNAL FIXATION (ORIF) DISTAL RADIAL FRACTURE;  Surgeon: Kennedy Bucker, MD;  Location: [facility] ORS;  Service: Orthopedics;  Laterality: Right;       Recent HPI and Hospital Course  Hospital Course: Adriana Miller is a 67 y.o. female with medical history significant of hypertension, hyperlipidemia, diabetes mellitus, diastolic CHF, gout, depression, CKD-3A, A-fib on Eliquis, alcoholic cirrhosis, thrombocytopenia, hepatocellular carcinoma, who presents with dysuria and palpitation.  Patient has abnormal urine, met sepsis criteria with increased heart rate, increased respiratory rate and a soft blood pressure.  Procalcitonin level 2.5.  Patient was started on Rocephin and IV fluid bolus in the emergency room.  Patient also had atrial fibrillation with RVR, heart rate better controlled with restarting home dose of metoprolol. Patient was treated with Rocephin, urine culture came back with E. coli, pansensitive.  Patient be treated with Keflex 500 mg twice a day for 7 days based on renal dosing. Assessment and Plan: Severe sepsis due to E. coli UTI (urinary tract infection): E. coli urinary tract infection Pt meets criteria for sepsis with heart rate up to 114 and RR up to 30.  Patient had hypotension which of  88/53, has persistent soft blood pressure.  Patient has worsening renal function.  Lactic acid 1.8, procalcitonin 2.50 Patient met severe sepsis criteria at time of admission.  This does appear to be secondary to UTI, urine culture grew E Coli.   Patient is treated with Rocephin, changed to to Keflex at discharge.   Atrial fibrillation with RVR (HCC): -continue home metoprolol 25 mg twice daily -Eliquis Heart rate is better controlled.   Chronic kidney disease stage IIIa. Hypokalemia. Hypomagnesemia. Acute kidney injury ruled out. Baseline creatinine 1.16 on 09/22/2022.  Her creatinine is 1.68, slightly worsening renal function due to sepsis.  However, does not meet acute kidney injury criteria. Replete potassium, received magnesium.  Renal function is back to baseline.   Alcoholic cirrhosis of liver without ascites (HCC):  No evidence of hepatic encephalopathy.  Continue to follow.   Essential hypertension Resume home treatment.   Thrombocytopenia (HCC): Platelet 124, likely due to liver cirrhosis Platelets has normalized.   Chronic heart failure with preserved ejection fraction (HFpEF) (HCC): 2D echo on 08/22/2022 showed EF of 59.6%.  Patient has trace leg edema, no JVD.  CHF seem to be compensated.  BNP is elevated at 338. Still has no evidence of exacerbation of congestive heart failure.  Resume home dose diuretics, follow-up with PCP as outpatient.   Type II diabetes mellitus with renal manifestations (HCC) Intermittent hypoglycemia. : A1c 7.2.   Patient developed some hypoglycemia, reduced insulin glargine to 8 units. Discontinue glimepiride.   Diarrhea Condition resolved.   Depression -Continue home medications  Post Hospital Acute Care Issue to be followed in the Clinic   Principal Problem:   E. coli UTI Active Problems:   Severe sepsis (HCC)   Atrial fibrillation with RVR (HCC)   Alcoholic cirrhosis of liver without ascites (HCC)   Essential hypertension    Thrombocytopenia (HCC)   Chronic heart failure with preserved ejection fraction (HFpEF) (HCC)   Type II diabetes mellitus with renal manifestations (HCC)   Diarrhea   Hypomagnesemia   Depression   Hyponatremia   Hypokalemia   Chronic kidney disease, stage 3a (HCC)   Hypoglycemia   Subjective:   Adriana Miller today has, No headache, No chest pain, No abdominal pain - No Nausea, No new weakness tingling or numbness, No Cough - SOB.   Assessment & Plan    1. Hospital discharge follow-up Treated for AKI due to UTI, AFIB and diabetes   2. E. coli UTI Treated in the hospital with intravenous antibiotics  3. Paroxysmal atrial fibrillation (HCC)  Followed by cardiology, continue medications as prescribed.   4. Type 2 diabetes mellitus without complication, with long-term current use of insulin (HCC) No changes, continue medications as prescribed.  - POCT CBG (Fasting - Glucose)   Reason for frequent admissions/ER visits    AKI UTI AFIB Diabetes Alcoholic cirrhosis Heart failure    Objective:   Vitals:   10/12/22 1415  BP: 100/60  Pulse: 70  Resp: 16  Temp: 97.6 F (36.4 C)  SpO2: 98%  Weight: 173 lb 6.4 oz (78.7 kg)  Height: 5\' 6"  (1.676 m)    Wt Readings from Last 3 Encounters:  10/12/22 173 lb 6.4 oz (78.7 kg)  10/02/22 177 lb 11.1 oz (80.6 kg)  08/30/22 174 lb (78.9 kg)    Allergies as of 10/12/2022       Reactions   Aspirin Other (See Comments)   Nose bleed        Medication List        Accurate as of October 12, 2022  2:44 PM. If you have any questions, ask your nurse or doctor.          Accu-Chek Guide Me w/Device Kit Use as directed  dx E11.65   Accu-Chek Guide test strip Generic drug: glucose blood Use as instructed twice a day DX E11.65   acetaminophen 325 MG tablet Commonly known as: TYLENOL Take 2 tablets (650 mg total) by mouth every 6 (six) hours as needed for mild pain (or Fever >/= 101).   allopurinol 100 MG  tablet Commonly known as: ZYLOPRIM TAKE 1 TABLET BY MOUTH DAILY   B-D SINGLE USE SWABS REGULAR Pads USE AS DIRECTED TWICE DAILY   BD Pen Needle Nano 2nd Gen 32G X 4 MM Misc Generic drug: Insulin Pen Needle USE AS DIRECTED WITH TOUJEO   Calcium Carb-Cholecalciferol 500-5 MG-MCG Tabs Commonly known as: Calcium 500/D Take 1 tab po daily   Dexcom G7 Receiver Devi Use for continuous blood glucose monitoring  DX E11.65   Dexcom G7 Sensor Misc USE AS DIRECTED FOR CONTINUOUS  GLUCOSE MONITORING. CHANGE  SENSOR EVERY 10 DAYS   Eliquis 5 MG Tabs tablet Generic drug: apixaban TAKE 1 TABLET BY MOUTH TWICE  DAILY   ergocalciferol 1.25 MG (50000 UT) capsule Commonly known as: VITAMIN D2 Take 1 capsule (50,000 Units total) by mouth once a week.   escitalopram 10 MG tablet Commonly known as: LEXAPRO TAKE 1 TABLET BY MOUTH DAILY FOR ANXIETY/SLEEP What changed: See the new instructions.   folic acid 1 MG tablet Commonly known as: FOLVITE Take 1 tablet (1 mg total) by mouth daily.   furosemide 20 MG tablet Commonly known as: LASIX TAKE 1 TABLET (20 MG TOTAL) BY MOUTH DAILY. What changed:  how much to take how to take this when to take this   metoprolol tartrate 25 MG tablet Commonly known as: LOPRESSOR Take 1 tablet (25 mg total) by mouth 2 (two) times daily. TAKE 1/2 TABLET BY MOUTH TWICE A DAY What changed: additional instructions   mirtazapine 7.5 MG tablet Commonly known as: REMERON TAKE 1 TABLET BY MOUTH DAILY AT  BEDTIME What changed:  how much to take how to take this when to take this   potassium chloride 8 MEQ tablet Commonly known as: KLOR-CON Take 1 tablet (8 mEq total) by mouth 3 (three) times a week. Monday, Wednesday, Friday   Safety Lancets 28G Misc Use as directed once a daily Dx e11.65   spironolactone 25 MG tablet Commonly known as:  ALDACTONE TAKE 1 TABLET BY MOUTH DAILY FOR CIRRHOSIS OF LIVER What changed: See the new instructions.   thiamine 100  MG tablet Commonly known as: Vitamin B-1 Take 1 tablet (100 mg total) by mouth daily.   Toujeo SoloStar 300 UNIT/ML Solostar Pen Generic drug: insulin glargine (1 Unit Dial) Inject 10 Units into the skin daily with supper.   traZODone 50 MG tablet Commonly known as: DESYREL TAKE 1 TO 2 TABLETS BY MOUTH AT  BEDTIME FOR INSOMNIA What changed:  how much to take how to take this when to take this additional instructions         Physical Exam: Constitutional: Patient appears well-developed and well-nourished. Not in obvious distress. HENT: Normocephalic, atraumatic, External right and left ear normal. Oropharynx is clear and moist.  Eyes: Conjunctivae and EOM are normal. PERRLA, no scleral icterus. Neck: Normal ROM. Neck supple. No JVD. No tracheal deviation. No thyromegaly. CVS: RRR, S1/S2 +, no murmurs, no gallops, no carotid bruit.  Pulmonary: Effort and breath sounds normal, no stridor, rhonchi, wheezes, rales.  Abdominal: Soft. BS +, no distension, tenderness, rebound or guarding.  Musculoskeletal: Normal range of motion. No edema and no tenderness.  Lymphadenopathy: No lymphadenopathy noted, cervical, inguinal or axillary Neuro: Alert. Normal reflexes, muscle tone coordination. No cranial nerve deficit. Skin: Skin is warm and dry. No rash noted. Not diaphoretic. No erythema. No pallor. Psychiatric: Normal mood and affect. Behavior, judgment, thought content normal.   Data Review   Micro Results No results found for this or any previous visit (from the past 240 hour(s)).   CBC No results for input(s): "WBC", "HGB", "HCT", "PLT", "MCV", "MCH", "MCHC", "RDW", "LYMPHSABS", "MONOABS", "EOSABS", "BASOSABS", "BANDABS" in the last 168 hours.  Invalid input(s): "NEUTRABS", "BANDSABD"  Chemistries  No results for input(s): "NA", "K", "CL", "CO2", "GLUCOSE", "BUN", "CREATININE", "CALCIUM", "MG", "AST", "ALT", "ALKPHOS", "BILITOT" in the last 168 hours.  Invalid input(s):  "GFRCGP" ------------------------------------------------------------------------------------------------------------------ estimated creatinine clearance is 42.6 mL/min (A) (by C-G formula based on SCr of 1.32 mg/dL (H)). ------------------------------------------------------------------------------------------------------------------ No results for input(s): "HGBA1C" in the last 72 hours. ------------------------------------------------------------------------------------------------------------------ No results for input(s): "CHOL", "HDL", "LDLCALC", "TRIG", "CHOLHDL", "LDLDIRECT" in the last 72 hours. ------------------------------------------------------------------------------------------------------------------ No results for input(s): "TSH", "T4TOTAL", "T3FREE", "THYROIDAB" in the last 72 hours.  Invalid input(s): "FREET3" ------------------------------------------------------------------------------------------------------------------ No results for input(s): "VITAMINB12", "FOLATE", "FERRITIN", "TIBC", "IRON", "RETICCTPCT" in the last 72 hours.  Coagulation profile No results for input(s): "INR", "PROTIME" in the last 168 hours.  No results for input(s): "DDIMER" in the last 72 hours.  Cardiac Enzymes No results for input(s): "CKMB", "TROPONINI", "MYOGLOBIN" in the last 168 hours.  Invalid input(s): "CK" ------------------------------------------------------------------------------------------------------------------ Invalid input(s): "POCBNP"  Return for previously scheduled appt with lauren pcp in august.   Time Spent in minutes  45 Time spent with patient included reviewing progress notes, labs, imaging studies, and discussing plan for follow up.   This patient was seen by Sallyanne Kuster, FNP-C in collaboration with Dr. Beverely Risen as a part of collaborative care agreement.    Sallyanne Kuster MSN, FNP-C on 10/12/2022 at 2:44 PM   **Disclaimer: This note may have  been dictated with voice recognition software. Similar sounding words can inadvertently be transcribed and this note may contain transcription errors which may not have been corrected upon publication of note.**

## 2022-10-13 ENCOUNTER — Other Ambulatory Visit: Payer: Self-pay | Admitting: Physician Assistant

## 2022-10-13 DIAGNOSIS — G4700 Insomnia, unspecified: Secondary | ICD-10-CM

## 2022-10-13 DIAGNOSIS — E119 Type 2 diabetes mellitus without complications: Secondary | ICD-10-CM | POA: Diagnosis not present

## 2022-10-13 DIAGNOSIS — G9341 Metabolic encephalopathy: Secondary | ICD-10-CM | POA: Diagnosis not present

## 2022-10-13 DIAGNOSIS — D72819 Decreased white blood cell count, unspecified: Secondary | ICD-10-CM | POA: Diagnosis not present

## 2022-10-13 DIAGNOSIS — Z8505 Personal history of malignant neoplasm of liver: Secondary | ICD-10-CM | POA: Diagnosis not present

## 2022-10-13 DIAGNOSIS — D696 Thrombocytopenia, unspecified: Secondary | ICD-10-CM | POA: Diagnosis not present

## 2022-10-13 DIAGNOSIS — I480 Paroxysmal atrial fibrillation: Secondary | ICD-10-CM | POA: Diagnosis not present

## 2022-10-13 DIAGNOSIS — E782 Mixed hyperlipidemia: Secondary | ICD-10-CM | POA: Diagnosis not present

## 2022-10-13 DIAGNOSIS — I110 Hypertensive heart disease with heart failure: Secondary | ICD-10-CM | POA: Diagnosis not present

## 2022-10-13 DIAGNOSIS — Z7984 Long term (current) use of oral hypoglycemic drugs: Secondary | ICD-10-CM | POA: Diagnosis not present

## 2022-10-13 DIAGNOSIS — Z7901 Long term (current) use of anticoagulants: Secondary | ICD-10-CM | POA: Diagnosis not present

## 2022-10-13 DIAGNOSIS — Z794 Long term (current) use of insulin: Secondary | ICD-10-CM | POA: Diagnosis not present

## 2022-10-13 DIAGNOSIS — I5032 Chronic diastolic (congestive) heart failure: Secondary | ICD-10-CM | POA: Diagnosis not present

## 2022-10-13 DIAGNOSIS — N179 Acute kidney failure, unspecified: Secondary | ICD-10-CM | POA: Diagnosis not present

## 2022-10-19 ENCOUNTER — Inpatient Hospital Stay: Payer: 59 | Admitting: Nurse Practitioner

## 2022-10-22 DIAGNOSIS — Z8505 Personal history of malignant neoplasm of liver: Secondary | ICD-10-CM | POA: Diagnosis not present

## 2022-10-22 DIAGNOSIS — Z7984 Long term (current) use of oral hypoglycemic drugs: Secondary | ICD-10-CM | POA: Diagnosis not present

## 2022-10-22 DIAGNOSIS — I110 Hypertensive heart disease with heart failure: Secondary | ICD-10-CM | POA: Diagnosis not present

## 2022-10-22 DIAGNOSIS — Z7901 Long term (current) use of anticoagulants: Secondary | ICD-10-CM | POA: Diagnosis not present

## 2022-10-22 DIAGNOSIS — E119 Type 2 diabetes mellitus without complications: Secondary | ICD-10-CM | POA: Diagnosis not present

## 2022-10-22 DIAGNOSIS — N179 Acute kidney failure, unspecified: Secondary | ICD-10-CM | POA: Diagnosis not present

## 2022-10-22 DIAGNOSIS — D696 Thrombocytopenia, unspecified: Secondary | ICD-10-CM | POA: Diagnosis not present

## 2022-10-22 DIAGNOSIS — I480 Paroxysmal atrial fibrillation: Secondary | ICD-10-CM | POA: Diagnosis not present

## 2022-10-22 DIAGNOSIS — E782 Mixed hyperlipidemia: Secondary | ICD-10-CM | POA: Diagnosis not present

## 2022-10-22 DIAGNOSIS — Z794 Long term (current) use of insulin: Secondary | ICD-10-CM | POA: Diagnosis not present

## 2022-10-22 DIAGNOSIS — G9341 Metabolic encephalopathy: Secondary | ICD-10-CM | POA: Diagnosis not present

## 2022-10-22 DIAGNOSIS — D72819 Decreased white blood cell count, unspecified: Secondary | ICD-10-CM | POA: Diagnosis not present

## 2022-10-22 DIAGNOSIS — I5032 Chronic diastolic (congestive) heart failure: Secondary | ICD-10-CM | POA: Diagnosis not present

## 2022-10-23 ENCOUNTER — Telehealth: Payer: Self-pay | Admitting: Physician Assistant

## 2022-10-23 NOTE — Telephone Encounter (Signed)
Patient lvm on after-hour line 11:30 pm Friday night stating her sugar was 344, but she did not want to call ems or go to ED. I s/w Saturday morning. She stated sugar was back to normal. Made her appointment 10/28/22 to see provider-Toni

## 2022-10-27 ENCOUNTER — Other Ambulatory Visit: Payer: Self-pay | Admitting: Internal Medicine

## 2022-10-27 DIAGNOSIS — D696 Thrombocytopenia, unspecified: Secondary | ICD-10-CM | POA: Diagnosis not present

## 2022-10-27 DIAGNOSIS — I110 Hypertensive heart disease with heart failure: Secondary | ICD-10-CM | POA: Diagnosis not present

## 2022-10-27 DIAGNOSIS — I480 Paroxysmal atrial fibrillation: Secondary | ICD-10-CM | POA: Diagnosis not present

## 2022-10-27 DIAGNOSIS — D72819 Decreased white blood cell count, unspecified: Secondary | ICD-10-CM | POA: Diagnosis not present

## 2022-10-27 DIAGNOSIS — Z794 Long term (current) use of insulin: Secondary | ICD-10-CM | POA: Diagnosis not present

## 2022-10-27 DIAGNOSIS — E782 Mixed hyperlipidemia: Secondary | ICD-10-CM | POA: Diagnosis not present

## 2022-10-27 DIAGNOSIS — Z7984 Long term (current) use of oral hypoglycemic drugs: Secondary | ICD-10-CM | POA: Diagnosis not present

## 2022-10-27 DIAGNOSIS — Z8505 Personal history of malignant neoplasm of liver: Secondary | ICD-10-CM | POA: Diagnosis not present

## 2022-10-27 DIAGNOSIS — G9341 Metabolic encephalopathy: Secondary | ICD-10-CM | POA: Diagnosis not present

## 2022-10-27 DIAGNOSIS — E119 Type 2 diabetes mellitus without complications: Secondary | ICD-10-CM | POA: Diagnosis not present

## 2022-10-27 DIAGNOSIS — Z7901 Long term (current) use of anticoagulants: Secondary | ICD-10-CM | POA: Diagnosis not present

## 2022-10-27 DIAGNOSIS — N179 Acute kidney failure, unspecified: Secondary | ICD-10-CM | POA: Diagnosis not present

## 2022-10-27 DIAGNOSIS — I5032 Chronic diastolic (congestive) heart failure: Secondary | ICD-10-CM | POA: Diagnosis not present

## 2022-10-28 ENCOUNTER — Ambulatory Visit: Payer: 59 | Admitting: Physician Assistant

## 2022-10-28 ENCOUNTER — Encounter: Payer: Self-pay | Admitting: Physician Assistant

## 2022-10-28 VITALS — BP 112/53 | HR 75 | Temp 98.2°F | Resp 16 | Ht 66.0 in | Wt 176.0 lb

## 2022-10-28 DIAGNOSIS — E119 Type 2 diabetes mellitus without complications: Secondary | ICD-10-CM

## 2022-10-28 DIAGNOSIS — I10 Essential (primary) hypertension: Secondary | ICD-10-CM

## 2022-10-28 DIAGNOSIS — Z794 Long term (current) use of insulin: Secondary | ICD-10-CM | POA: Diagnosis not present

## 2022-10-28 NOTE — Progress Notes (Signed)
[facility] [address] [place], [place] [address]  Internal MEDICINE  Office Visit Note  Patient Name: Adriana Miller  [id number]  [id number]  Date of Service: 11/03/2022  Chief Complaint  Patient presents with   Acute Visit   Hypertension     HPI Pt is here for a sick visit for HTN. -Sugar has been doing well. Has continuous monitor and this has been working well -home therapy and nursing coming out -BP at home not really checked, does have nursing checking. BP appears well controlled in office today, even on low side. Encouraged to keep monitoring -feeling well in office  Current Medication:  Outpatient Encounter Medications as of 10/28/2022  Medication Sig   acetaminophen (TYLENOL) 325 MG tablet Take 2 tablets (650 mg total) by mouth every 6 (six) hours as needed for mild pain (or Fever >/= 101).   Alcohol Swabs (B-D SINGLE USE SWABS REGULAR) PADS USE AS DIRECTED TWICE DAILY   allopurinol (ZYLOPRIM) 100 MG tablet TAKE 1 TABLET BY MOUTH DAILY   Blood Glucose Monitoring Suppl (ACCU-CHEK GUIDE ME) w/Device KIT Use as directed  dx E11.65   Calcium Carb-Cholecalciferol (CALCIUM 500/D) 500-5 MG-MCG TABS Take 1 tab po daily   Continuous Blood Gluc Receiver (DEXCOM G7 RECEIVER) DEVI Use for continuous blood glucose monitoring  DX E11.65   Continuous Glucose Sensor (DEXCOM G7 SENSOR) MISC USE AS DIRECTED FOR CONTINUOUS  GLUCOSE MONITORING. CHANGE  SENSOR EVERY 10 DAYS   ELIQUIS 5 MG TABS tablet TAKE 1 TABLET BY MOUTH TWICE  DAILY   ergocalciferol (VITAMIN D2) 1.25 MG (50000 UT) capsule Take 1 capsule (50,000 Units total) by mouth once a week.   escitalopram (LEXAPRO) 10 MG tablet TAKE 1 TABLET BY MOUTH DAILY FOR ANXIETY/SLEEP (Patient taking differently: Take 10 mg by mouth daily.)   folic acid (FOLVITE) 1 MG tablet Take 1 tablet (1 mg total) by mouth daily.   furosemide (LASIX) 20 MG tablet TAKE 1 TABLET (20 MG TOTAL) BY MOUTH DAILY. (Patient taking differently:  Take 20 mg by mouth daily. TAKE 1 TABLET (20 MG TOTAL) BY MOUTH DAILY.)   glucose blood (ACCU-CHEK GUIDE) test strip Use as instructed twice a day DX E11.65   insulin glargine, 1 Unit Dial, (TOUJEO SOLOSTAR) 300 UNIT/ML Solostar Pen Inject 10 Units into the skin daily with supper.   Insulin Pen Needle (BD PEN NEEDLE NANO 2ND GEN) 32G X 4 MM MISC USE AS DIRECTED WITH TOUJEO   metoprolol tartrate (LOPRESSOR) 25 MG tablet Take 1 tablet (25 mg total) by mouth 2 (two) times daily. TAKE 1/2 TABLET BY MOUTH TWICE A DAY (Patient taking differently: Take 25 mg by mouth 2 (two) times daily.)   mirtazapine (REMERON) 7.5 MG tablet TAKE 1 TABLET BY MOUTH DAILY AT  BEDTIME (Patient taking differently: Take 7.5 mg by mouth at bedtime. TAKE 1 TABLET BY MOUTH DAILY AT  BEDTIME)   potassium chloride (KLOR-CON) 8 MEQ tablet Take 1 tablet (8 mEq total) by mouth 3 (three) times a week. Monday, Wednesday, Friday   Safety Lancets 28G MISC Use as directed once a daily Dx e11.65   spironolactone (ALDACTONE) 25 MG tablet TAKE 1 TABLET BY MOUTH DAILY FOR CIRRHOSIS OF LIVER (Patient taking differently: Take 25 mg by mouth daily.)   thiamine (VITAMIN B-1) 100 MG tablet Take 1 tablet (100 mg total) by mouth daily.   traZODone (DESYREL) 50 MG tablet TAKE 1 TO 2 TABLETS BY MOUTH AT  BEDTIME FOR INSOMNIA   No  facility-administered encounter medications on file as of 10/28/2022.      Medical History: 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)    B12 deficiency 04/05/2017   Blood in stool    Cerebral aneurysm    Chronic kidney disease    Diabetes mellitus without complication (HCC)    type 2   Gastric erosion with bleeding    Hepatocellular carcinoma (HCC)    Hyperlipidemia    Hypertension    Lower extremity cellulitis 02/08/2019   Paroxysmal A-fib (HCC)    Seizure disorder (HCC)      Vital Signs: BP (!) 112/53   Pulse 75   Temp 98.2 F (36.8 C)   Resp 16   Ht  5\' 6"  (1.676 m)   Wt 176 lb (79.8 kg)   SpO2 98%   BMI 28.41 kg/m    Review of Systems  Constitutional:  Negative for chills, fatigue and unexpected weight change.  HENT:  Negative for congestion, postnasal drip, rhinorrhea, sneezing and sore throat.   Eyes:  Negative for redness.  Respiratory:  Negative for cough, chest tightness and shortness of breath.   Cardiovascular:  Negative for chest pain and palpitations.  Gastrointestinal:  Negative for abdominal pain, constipation, diarrhea, nausea and vomiting.  Genitourinary:  Negative for dysuria and frequency.  Musculoskeletal:  Negative for arthralgias, back pain, joint swelling and neck pain.  Skin:  Negative for rash.  Neurological: Negative.  Negative for tremors and numbness.  Hematological:  Negative for adenopathy. Does not bruise/bleed easily.  Psychiatric/Behavioral:  Negative for behavioral problems (Depression), sleep disturbance and suicidal ideas. The patient is not nervous/anxious.     Physical Exam Constitutional:      General: She is not in acute distress.    Appearance: She is well-developed. She is not diaphoretic.  HENT:     Head: Normocephalic and atraumatic.     Mouth/Throat:     Pharynx: No oropharyngeal exudate.  Eyes:     Pupils: Pupils are equal, round, and reactive to light.  Neck:     Thyroid: No thyromegaly.     Vascular: No JVD.     Trachea: No tracheal deviation.  Cardiovascular:     Rate and Rhythm: Normal rate and regular rhythm.     Heart sounds: Normal heart sounds. No murmur heard.    No friction rub. No gallop.  Pulmonary:     Effort: Pulmonary effort is normal. No respiratory distress.     Breath sounds: No wheezing or rales.  Chest:     Chest wall: No tenderness.  Abdominal:     General: Bowel sounds are normal.     Palpations: Abdomen is soft.  Musculoskeletal:        General: Normal range of motion.     Cervical back: Normal range of motion and neck supple.  Lymphadenopathy:      Cervical: No cervical adenopathy.  Skin:    General: Skin is warm and dry.  Neurological:     Mental Status: She is alert and oriented to person, place, and time.     Cranial Nerves: No cranial nerve deficit.     Comments: Utilizes cane for mobility  Psychiatric:        Behavior: Behavior normal.        Thought Content: Thought content normal.        Judgment: Judgment normal.       Assessment/Plan: 1. Type 2 diabetes mellitus without complication,  with long-term current use of insulin (HCC) Continue current medications and utilizing continuous monitor  2. Essential hypertension Well controlled, continue to monitor   General Counseling: aria jarrard understanding of the findings of todays visit and agrees with plan of treatment. I have discussed any further diagnostic evaluation that may be needed or ordered today. We also reviewed her medications today. she has been encouraged to call the office with any questions or concerns that should arise related to todays visit.    Counseling:    No orders of the defined types were placed in this encounter.   No orders of the defined types were placed in this encounter.   Time spent:30 Minutes

## 2022-11-07 ENCOUNTER — Other Ambulatory Visit: Payer: Self-pay | Admitting: Internal Medicine

## 2022-11-10 ENCOUNTER — Other Ambulatory Visit: Payer: Self-pay | Admitting: Physician Assistant

## 2022-11-16 ENCOUNTER — Ambulatory Visit: Payer: 59 | Attending: Physician Assistant | Admitting: Physician Assistant

## 2022-11-16 ENCOUNTER — Encounter: Payer: Self-pay | Admitting: Physician Assistant

## 2022-11-16 VITALS — BP 126/74 | HR 93 | Ht 66.0 in | Wt 173.0 lb

## 2022-11-16 DIAGNOSIS — I10 Essential (primary) hypertension: Secondary | ICD-10-CM | POA: Diagnosis not present

## 2022-11-16 DIAGNOSIS — I480 Paroxysmal atrial fibrillation: Secondary | ICD-10-CM

## 2022-11-16 DIAGNOSIS — Z79899 Other long term (current) drug therapy: Secondary | ICD-10-CM

## 2022-11-16 DIAGNOSIS — K7460 Unspecified cirrhosis of liver: Secondary | ICD-10-CM | POA: Diagnosis not present

## 2022-11-16 NOTE — Patient Instructions (Signed)
Have home health aide to call back with your home medications.   BRING PILL BOTTLES AT NEXT OFFICE VISIT  Medication Instructions:  No changes at this time.   *If you need a refill on your cardiac medications before your next appointment, please call your pharmacy*   Lab Work: None  If you have labs (blood work) drawn today and your tests are completely normal, you will receive your results only by: MyChart Message (if you have MyChart) OR A paper copy in the mail If you have any lab test that is abnormal or we need to change your treatment, we will call you to review the results.   Testing/Procedures: None   Follow-Up: At [facility], you and your health needs are our priority.  As part of our continuing mission to provide you with exceptional heart care, we have created designated Provider Care Teams.  These Care Teams include your primary Cardiologist (physician) and Advanced Practice Providers (APPs -  Physician Assistants and Nurse Practitioners) who all work together to provide you with the care you need, when you need it.  We recommend signing up for the patient portal called "MyChart".  Sign up information is provided on this After Visit Summary.  MyChart is used to connect with patients for Virtual Visits (Telemedicine).  Patients are able to view lab/test results, encounter notes, upcoming appointments, etc.  Non-urgent messages can be sent to your provider as well.   To learn more about what you can do with MyChart, go to [email or web address].    Your next appointment:   1 month(s)  Provider:   Yvonne Kendall, MD or Eula Listen, PA-C

## 2022-11-16 NOTE — Progress Notes (Signed)
Cardiology Office Note    Date:  11/16/2022   ID:  Layni, Peltz [birth year], MRN [id number]  PCP:  Carlean Jews, PA-C  Cardiologist:  Yvonne Kendall, MD  Electrophysiologist:  None   Chief Complaint: Follow-up  History of Present Illness:   Adriana Miller is a 67 y.o. female with history of PAF/flutter on apixaban, DM2, CKD stage II, cirrhosis complicated by GI bleed secondary to erosive gastropathy in 01/2019 and hepatocellular carcinoma, seizure disorder, cerebral aneurysm status postrepair, HTN, and HLD who presents for follow-up of A-fib/flutter.  She was diagnosed with new onset A-fib in 09/2018 and referred to the ED where she was placed on diltiazem and apixaban.  Plan was to pursue TEE guided DCCV upon establishing with cardiology in 12/2018.  However, she spontaneously converted to sinus rhythm.  Echo from 12/2018 showed an EF of 55 to 60%, normal RV systolic function and ventricular cavity size, trivial tricuspid regurgitation, and an estimated right atrial pressure of 3 mmHg.  She was diagnosed with atrial flutter in 05/2019 with subsequent spontaneous conversion noted at follow-up in 06/2019.  She was noted to be back in A-fib with RVR in 07/2019 and transferred to the ED where she was hydrated and administered IV diltiazem for rate control with outpatient follow-up.  In follow-up in 10/2019, she had converted to sinus bradycardia, with sinus rhythm noted at cardiology visits thereafter.  She was last seen in the office in 04/2022 and was without symptoms of angina or cardiac decompensation.  Since we last saw her in the office, she has been admitted to the hospital several times including in 05/2022 for mechanical fall with rhabdomyolysis.  In 08/2021 with intractable nausea and vomiting and A-fib with RVR with symptomatic improvement with IV hydration and rate control.  High-sensitivity troponin minimally elevated at 18.  Echo in 08/2022 showed an EF of 55 to 60%, no regional  wall motion abnormalities, normal RV systolic function and ventricular cavity size, mildly to moderately dilated left atrium, and no significant valvular abnormalities.  She was most recently admitted in 09/2022 with sepsis secondary to E. coli UTI complicated by AKI and A-fib with RVR.  Multiple high-sensitivity troponin negative.  She comes in today doing well from a cardiac perspective and is without symptoms of angina or cardiac decompensation.  She denies palpitations, dizziness, presyncope, or syncope.  No further falls.  No hematochezia or melena.  She indicates she does her own medications and is unclear what she is taking outside of allopurinol, apixaban, and furosemide.  She reports having a home health aide that comes in for 2 hours daily.  No lower extremity swelling, abdominal distention, early satiety, or progressive orthopnea.   Labs independently reviewed: 09/2022 - Hgb 11.0, PLT 202, magnesium 1.8, potassium 4.5, BUN 18, serum creatinine 1.32, albumin 2.1, AST 60, ALT normal 10/2021 - TC 134, TG 71, HDL 32, LDL 88, TSH  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)    B12 deficiency 04/05/2017   Blood in stool    Cerebral aneurysm    Chronic kidney disease    Diabetes mellitus without complication (HCC)    type 2   Gastric erosion with bleeding    Hepatocellular carcinoma (HCC)    Hyperlipidemia    Hypertension    Lower extremity cellulitis 02/08/2019   Paroxysmal A-fib (HCC)    Seizure disorder [facility])     Past Surgical History:  Procedure Laterality Date  CEREBRAL ANEURYSM REPAIR  1991   COLONOSCOPY WITH PROPOFOL N/A 02/12/2019   Procedure: COLONOSCOPY WITH PROPOFOL;  Surgeon: Toney Reil, MD;  Location: [facility] ENDOSCOPY;  Service: Gastroenterology;  Laterality: N/A;   ESOPHAGOGASTRODUODENOSCOPY (EGD) WITH PROPOFOL N/A 02/12/2019   Procedure: ESOPHAGOGASTRODUODENOSCOPY (EGD) WITH PROPOFOL;  Surgeon: Toney Reil, MD;  Location: [facility] ENDOSCOPY;  Service: Gastroenterology;  Laterality: N/A;   IR ANGIOGRAM SELECTIVE EACH ADDITIONAL VESSEL  09/21/2019   IR ANGIOGRAM SELECTIVE EACH ADDITIONAL VESSEL  09/21/2019   IR ANGIOGRAM SELECTIVE EACH ADDITIONAL VESSEL  09/21/2019   IR ANGIOGRAM SELECTIVE EACH ADDITIONAL VESSEL  09/21/2019   IR ANGIOGRAM SELECTIVE EACH ADDITIONAL VESSEL  10/02/2019   IR ANGIOGRAM VISCERAL SELECTIVE  09/21/2019   IR ANGIOGRAM VISCERAL SELECTIVE  09/21/2019   IR ANGIOGRAM VISCERAL SELECTIVE  10/02/2019   IR EMBO TUMOR ORGAN ISCHEMIA INFARCT INC GUIDE ROADMAPPING  10/02/2019   IR EMBO TUMOR ORGAN ISCHEMIA INFARCT INC GUIDE ROADMAPPING  10/02/2019   IR RADIOLOGIST EVAL & MGMT  08/28/2019   IR RADIOLOGIST EVAL & MGMT  01/30/2020   IR RADIOLOGIST EVAL & MGMT  04/30/2020   IR RADIOLOGIST EVAL & MGMT  07/30/2020   IR RADIOLOGIST EVAL & MGMT  10/28/2020   IR RADIOLOGIST EVAL & MGMT  02/24/2021   IR RADIOLOGIST EVAL & MGMT  06/08/2021   IR RADIOLOGIST EVAL & MGMT  11/24/2021   IR RADIOLOGIST EVAL & MGMT  05/25/2022   IR US GUIDE VASC ACCESS LEFT  09/21/2019   IR US GUIDE VASC ACCESS LEFT  10/02/2019   OPEN REDUCTION INTERNAL FIXATION (ORIF) DISTAL RADIAL FRACTURE Right 08/06/2021   Procedure: OPEN REDUCTION INTERNAL FIXATION (ORIF) DISTAL RADIAL FRACTURE;  Surgeon: Kennedy Bucker, MD;  Location: [facility] ORS;  Service: Orthopedics;  Laterality: Right;    Current Medications: Current Meds  Medication Sig   acetaminophen (TYLENOL) 325 MG tablet Take 2 tablets (650 mg total) by mouth every 6 (six) hours as needed for mild pain (or Fever >/= 101).   Alcohol Swabs (B-D SINGLE USE SWABS REGULAR) PADS USE AS DIRECTED TWICE DAILY   allopurinol (ZYLOPRIM) 100 MG tablet TAKE 1 TABLET BY MOUTH DAILY   Blood Glucose Monitoring Suppl (ACCU-CHEK GUIDE ME) w/Device KIT Use as directed  dx E11.65   Calcium Carb-Cholecalciferol (CALCIUM 500/D) 500-5 MG-MCG TABS Take 1 tab po daily   Continuous Blood Gluc Receiver (DEXCOM G7  RECEIVER) DEVI Use for continuous blood glucose monitoring  DX E11.65   Continuous Glucose Sensor (DEXCOM G7 SENSOR) MISC USE AS DIRECTED FOR CONTINUOUS  GLUCOSE MONITORING. CHANGE  SENSOR EVERY 10 DAYS   ELIQUIS 5 MG TABS tablet TAKE 1 TABLET BY MOUTH TWICE  DAILY   ergocalciferol (VITAMIN D2) 1.25 MG (50000 UT) capsule Take 1 capsule (50,000 Units total) by mouth once a week.   escitalopram (LEXAPRO) 10 MG tablet TAKE 1 TABLET BY MOUTH DAILY FOR ANXIETY/SLEEP (Patient taking differently: Take 10 mg by mouth daily.)   furosemide (LASIX) 20 MG tablet TAKE 1 TABLET (20 MG TOTAL) BY MOUTH DAILY. (Patient taking differently: Take 20 mg by mouth daily. TAKE 1 TABLET (20 MG TOTAL) BY MOUTH DAILY.)   insulin glargine, 1 Unit Dial, (TOUJEO SOLOSTAR) 300 UNIT/ML Solostar Pen Inject 10 Units into the skin daily with supper.   Insulin Pen Needle (BD PEN NEEDLE NANO 2ND GEN) 32G X 4 MM MISC USE AS DIRECTED WITH TOUJEO   Safety Lancets 28G MISC Use as directed once a daily Dx e11.65   spironolactone (ALDACTONE)  25 MG tablet TAKE 1 TABLET BY MOUTH DAILY FOR CIRRHOSIS OF LIVER (Patient taking differently: Take 25 mg by mouth daily.)   traZODone (DESYREL) 50 MG tablet TAKE 1 TO 2 TABLETS BY MOUTH AT  BEDTIME FOR INSOMNIA    Allergies:   Aspirin   Social History   Socioeconomic History   Marital status: Divorced    Spouse name: Not on file   Number of children: Not on file   Years of education: Not on file   Highest education level: Not on file  Occupational History   Not on file  Tobacco Use   Smoking status: Never   Smokeless tobacco: Current    Types: Snuff  Vaping Use   Vaping status: Never Used  Substance and Sexual Activity   Alcohol use: Not Currently    Alcohol/week: 14.0 standard drinks of alcohol    Types: 14 Cans of beer per week    Comment: twice a week (beer)   Drug use: No   Sexual activity: Not Currently  Other Topics Concern   Not on file  Social History Narrative   Lives  alone   Social Determinants of Health   Financial Resource Strain: Not on file  Food Insecurity: No Food Insecurity (08/22/2022)   Hunger Vital Sign    Worried About Running Out of Food in the Last Year: Never true    Ran Out of Food in the Last Year: Never true  Transportation Needs: No Transportation Needs (08/22/2022)   PRAPARE - Administrator, Civil Service (Medical): No    Lack of Transportation (Non-Medical): No  Physical Activity: Not on file  Stress: Not on file  Social Connections: Not on file     Family History:  The patient's family history includes Heart attack (age of onset: 11) in her brother.  ROS:   12-point review of systems is negative unless otherwise noted in the HPI.   EKGs/Labs/Other Studies Reviewed:    Studies reviewed were summarized above. The additional studies were reviewed today:  2D echo 08/22/2022: 1. Left ventricular ejection fraction, by estimation, is 55 to 60%. The  left ventricle has normal function. The left ventricle has no regional  wall motion abnormalities. Left ventricular diastolic parameters are  indeterminate.   2. Right ventricular systolic function is normal. The right ventricular  size is normal.   3. Left atrial size was mild to moderately dilated.   4. The mitral valve is normal in structure. No evidence of mitral valve  regurgitation.   5. The aortic valve is bicuspid. Aortic valve regurgitation is not  visualized.  __________  2D echo 01/15/2019: 1. Left ventricular ejection fraction, by visual estimation, is 55 to  60%. The left ventricle has normal function. Normal left ventricular size.  There is no left ventricular hypertrophy.   2. Global right ventricle has normal systolic function.The right  ventricular size is normal. No increase in right ventricular wall  thickness.   3. Left atrial size was normal.   4. Right atrial size was normal.   5. The mitral valve is normal in structure. No evidence of mitral  valve  regurgitation. No evidence of mitral stenosis.   6. The tricuspid valve is normal in structure. Tricuspid valve  regurgitation is trivial.   7. The aortic valve is normal in structure. Aortic valve regurgitation  was not visualized by color flow Doppler. Structurally normal aortic  valve, with no evidence of sclerosis or stenosis.   8. The  pulmonic valve was normal in structure. Pulmonic valve  regurgitation is trivial by color flow Doppler.   9. TR signal is inadequate for assessing pulmonary artery systolic  pressure.  10. The inferior vena cava is normal in size with greater than 50%  respiratory variability, suggesting right atrial pressure of 3 mmHg.    EKG:  EKG is ordered today.  The EKG ordered today demonstrates NSR, 93 bpm, no acute ST-T changes  Recent Labs: 11/16/2021: TSH 2.735 09/30/2022: B Natriuretic Peptide 238.7 10/01/2022: ALT 25 10/02/2022: BUN 18; Creatinine, Ser 1.32; Hemoglobin 11.0; Magnesium 1.8; Platelets 202; Potassium 4.5; Sodium 136  Recent Lipid Panel    Component Value Date/Time   CHOL 134 11/16/2021 1716   CHOL 134 01/27/2018 1340   TRIG 71 11/16/2021 1716   HDL 32 (L) 11/16/2021 1716   HDL 80 01/27/2018 1340   CHOLHDL 4.2 11/16/2021 1716   VLDL 14 11/16/2021 1716   LDLCALC 88 11/16/2021 1716   LDLCALC 42 01/27/2018 1340    PHYSICAL EXAM:    VS:  BP 126/74 (BP Location: Right Arm, Patient Position: Sitting, Cuff Size: Normal)   Pulse 93   Ht 5\' 6"  (1.676 m)   Wt 173 lb (78.5 kg)   SpO2 96%   BMI 27.92 kg/m   BMI: Body mass index is 27.92 kg/m.  Physical Exam Vitals reviewed.  Constitutional:      Appearance: She is well-developed.  HENT:     Head: Normocephalic and atraumatic.  Eyes:     General:        Right eye: No discharge.        Left eye: No discharge.  Neck:     Vascular: No JVD.  Cardiovascular:     Rate and Rhythm: Normal rate and regular rhythm.     Heart sounds: Normal heart sounds, S1 normal and S2 normal.  Heart sounds not distant. No midsystolic click and no opening snap. No murmur heard.    No friction rub.  Pulmonary:     Effort: Pulmonary effort is normal. No respiratory distress.     Breath sounds: Normal breath sounds. No decreased breath sounds, wheezing or rales.  Chest:     Chest wall: No tenderness.  Abdominal:     General: There is no distension.  Musculoskeletal:     Cervical back: Normal range of motion.  Skin:    General: Skin is warm and dry.     Nails: There is no clubbing.  Neurological:     Mental Status: She is alert and oriented to person, place, and time.  Psychiatric:        Speech: Speech normal.        Behavior: Behavior normal.        Thought Content: Thought content normal.        Judgment: Judgment normal.     Wt Readings from Last 3 Encounters:  11/16/22 173 lb (78.5 kg)  10/28/22 176 lb (79.8 kg)  10/12/22 173 lb 6.4 oz (78.7 kg)     ASSESSMENT & PLAN:   PAF: Maintaining sinus rhythm.  CHA2DS2-VASc at least 4.  She reports adherence to apixaban 5 mg twice daily and does not meet reduced dosing criteria.  Recent labs stable.  No symptoms concerning for bleeding or further falls.  However, if she has recurrent falls we may need to consider EP referral for consideration of Watchman.  HTN: Blood pressure is well-controlled in the office today.  Medication management as outlined below.  Cirrhosis: Medication list indicates that she has been prescribed furosemide and spironolactone with ongoing follow-up per hepatology.  Medication management: She is unclear what medication she is currently taking outside of allopurinol, apixaban, and furosemide.  Further recommendations regarding medication management are unable to be made at this time.  She indicates either she or her home health aide will contact our office on 7/31 to update [place] on her current medication list.  She would also likely benefit from a home health aide to assist with her pillbox or pill  pockets.  Perhaps her PCP could assist with this.  For now, she indicates that she will have her brother help with her medications.   Disposition: F/u with Dr. Okey Dupre or an APP in 1 month to follow-up on medication reconciliation.   Medication Adjustments/Labs and Tests Ordered: Current medicines are reviewed at length with the patient today.  Concerns regarding medicines are outlined above. Medication changes, Labs and Tests ordered today are summarized above and listed in the Patient Instructions accessible in Encounters.   Signed, Eula Listen, PA-C 11/16/2022 4:43 PM     [facility] - [place] [address] [place], [place] [address] [phone]

## 2022-11-17 ENCOUNTER — Emergency Department: Payer: 59

## 2022-11-17 ENCOUNTER — Other Ambulatory Visit: Payer: Self-pay

## 2022-11-17 ENCOUNTER — Inpatient Hospital Stay
Admission: EM | Admit: 2022-11-17 | Discharge: 2022-11-21 | DRG: 872 | Disposition: A | Discharge status: Home Health | Payer: 59 | Attending: Internal Medicine | Admitting: Internal Medicine

## 2022-11-17 DIAGNOSIS — R531 Weakness: Secondary | ICD-10-CM

## 2022-11-17 DIAGNOSIS — F419 Anxiety disorder, unspecified: Secondary | ICD-10-CM | POA: Diagnosis present

## 2022-11-17 DIAGNOSIS — W19XXXA Unspecified fall, initial encounter: Secondary | ICD-10-CM | POA: Diagnosis not present

## 2022-11-17 DIAGNOSIS — Z7901 Long term (current) use of anticoagulants: Secondary | ICD-10-CM | POA: Diagnosis not present

## 2022-11-17 DIAGNOSIS — B952 Enterococcus as the cause of diseases classified elsewhere: Secondary | ICD-10-CM | POA: Diagnosis not present

## 2022-11-17 DIAGNOSIS — E119 Type 2 diabetes mellitus without complications: Secondary | ICD-10-CM

## 2022-11-17 DIAGNOSIS — B9620 Unspecified Escherichia coli [E. coli] as the cause of diseases classified elsewhere: Secondary | ICD-10-CM | POA: Diagnosis present

## 2022-11-17 DIAGNOSIS — I4891 Unspecified atrial fibrillation: Principal | ICD-10-CM

## 2022-11-17 DIAGNOSIS — E1122 Type 2 diabetes mellitus with diabetic chronic kidney disease: Secondary | ICD-10-CM | POA: Diagnosis present

## 2022-11-17 DIAGNOSIS — Z794 Long term (current) use of insulin: Secondary | ICD-10-CM

## 2022-11-17 DIAGNOSIS — Z66 Do not resuscitate: Secondary | ICD-10-CM | POA: Diagnosis present

## 2022-11-17 DIAGNOSIS — Z8505 Personal history of malignant neoplasm of liver: Secondary | ICD-10-CM

## 2022-11-17 DIAGNOSIS — K449 Diaphragmatic hernia without obstruction or gangrene: Secondary | ICD-10-CM | POA: Diagnosis not present

## 2022-11-17 DIAGNOSIS — E785 Hyperlipidemia, unspecified: Secondary | ICD-10-CM | POA: Diagnosis not present

## 2022-11-17 DIAGNOSIS — I4892 Unspecified atrial flutter: Secondary | ICD-10-CM | POA: Diagnosis not present

## 2022-11-17 DIAGNOSIS — R6520 Severe sepsis without septic shock: Secondary | ICD-10-CM | POA: Diagnosis not present

## 2022-11-17 DIAGNOSIS — I480 Paroxysmal atrial fibrillation: Secondary | ICD-10-CM | POA: Diagnosis not present

## 2022-11-17 DIAGNOSIS — Z79899 Other long term (current) drug therapy: Secondary | ICD-10-CM

## 2022-11-17 DIAGNOSIS — N390 Urinary tract infection, site not specified: Secondary | ICD-10-CM | POA: Diagnosis present

## 2022-11-17 DIAGNOSIS — I10 Essential (primary) hypertension: Secondary | ICD-10-CM | POA: Diagnosis not present

## 2022-11-17 DIAGNOSIS — G40909 Epilepsy, unspecified, not intractable, without status epilepticus: Secondary | ICD-10-CM | POA: Diagnosis present

## 2022-11-17 DIAGNOSIS — R112 Nausea with vomiting, unspecified: Secondary | ICD-10-CM

## 2022-11-17 DIAGNOSIS — R54 Age-related physical debility: Secondary | ICD-10-CM | POA: Insufficient documentation

## 2022-11-17 DIAGNOSIS — A419 Sepsis, unspecified organism: Secondary | ICD-10-CM | POA: Diagnosis not present

## 2022-11-17 DIAGNOSIS — A4151 Sepsis due to Escherichia coli [E. coli]: Secondary | ICD-10-CM | POA: Diagnosis not present

## 2022-11-17 DIAGNOSIS — K7030 Alcoholic cirrhosis of liver without ascites: Secondary | ICD-10-CM | POA: Diagnosis present

## 2022-11-17 DIAGNOSIS — Z8249 Family history of ischemic heart disease and other diseases of the circulatory system: Secondary | ICD-10-CM

## 2022-11-17 DIAGNOSIS — E1159 Type 2 diabetes mellitus with other circulatory complications: Secondary | ICD-10-CM | POA: Diagnosis present

## 2022-11-17 DIAGNOSIS — I482 Chronic atrial fibrillation, unspecified: Secondary | ICD-10-CM

## 2022-11-17 DIAGNOSIS — I6782 Cerebral ischemia: Secondary | ICD-10-CM | POA: Diagnosis not present

## 2022-11-17 DIAGNOSIS — R000 Tachycardia, unspecified: Secondary | ICD-10-CM | POA: Diagnosis not present

## 2022-11-17 DIAGNOSIS — I672 Cerebral atherosclerosis: Secondary | ICD-10-CM | POA: Diagnosis not present

## 2022-11-17 DIAGNOSIS — D61818 Other pancytopenia: Secondary | ICD-10-CM | POA: Diagnosis present

## 2022-11-17 DIAGNOSIS — I5032 Chronic diastolic (congestive) heart failure: Secondary | ICD-10-CM | POA: Diagnosis not present

## 2022-11-17 DIAGNOSIS — N1832 Chronic kidney disease, stage 3b: Secondary | ICD-10-CM | POA: Diagnosis not present

## 2022-11-17 DIAGNOSIS — Z87440 Personal history of urinary (tract) infections: Secondary | ICD-10-CM

## 2022-11-17 DIAGNOSIS — Z8711 Personal history of peptic ulcer disease: Secondary | ICD-10-CM

## 2022-11-17 DIAGNOSIS — I130 Hypertensive heart and chronic kidney disease with heart failure and stage 1 through stage 4 chronic kidney disease, or unspecified chronic kidney disease: Secondary | ICD-10-CM | POA: Diagnosis present

## 2022-11-17 DIAGNOSIS — C220 Liver cell carcinoma: Secondary | ICD-10-CM | POA: Diagnosis not present

## 2022-11-17 DIAGNOSIS — R42 Dizziness and giddiness: Secondary | ICD-10-CM | POA: Diagnosis not present

## 2022-11-17 DIAGNOSIS — N2889 Other specified disorders of kidney and ureter: Secondary | ICD-10-CM | POA: Diagnosis not present

## 2022-11-17 DIAGNOSIS — K8020 Calculus of gallbladder without cholecystitis without obstruction: Secondary | ICD-10-CM | POA: Diagnosis not present

## 2022-11-17 DIAGNOSIS — R6510 Systemic inflammatory response syndrome (SIRS) of non-infectious origin without acute organ dysfunction: Secondary | ICD-10-CM

## 2022-11-17 MED ORDER — ONDANSETRON HCL 4 MG/2ML IJ SOLN
4.0000 mg | Freq: Once | INTRAMUSCULAR | Status: AC
  Administered 2022-11-18: 4 mg via INTRAVENOUS
  Filled 2022-11-17: qty 2

## 2022-11-17 MED ORDER — SODIUM CHLORIDE 0.9 % IV BOLUS
1000.0000 mL | Freq: Once | INTRAVENOUS | Status: AC
  Administered 2022-11-18: 1000 mL via INTRAVENOUS

## 2022-11-17 MED ORDER — DILTIAZEM HCL 25 MG/5ML IV SOLN
10.0000 mg | Freq: Once | INTRAVENOUS | Status: AC
  Administered 2022-11-18: 10 mg via INTRAVENOUS
  Filled 2022-11-17: qty 5

## 2022-11-17 NOTE — ED Provider Notes (Signed)
[facility] Provider Note    Event Date/Time   First MD Initiated Contact with Patient 11/17/22 2305     (approximate)   History   Dizziness   HPI  Adriana Miller is a 67 y.o. female  here with dizziness, weakness. Pt has h/o AFib, HTN, HLD DM, dCHF, alcoholic cirrhosis, hepatocellular CA, here with syncope and weakness.  Patient states that she has felt generally unwell for the last 2 days or so.  She felt very weak throughout the day yesterday and also felt some shortness of breath with exertion.  She felt palpitations.  She had decreased appetite.  She did vomit with as well.  Earlier today, the patient was trying to get out of bed and felt too weak.  She believes she passed out.  She woke up laying down and very weak.  She retrogram is able to hit her life alert.  She states that she has felt generally fatigued.  She had some shortness of breath with exertion.  No current pain.  No abdominal pain.  No urinary symptoms.  Of note, she was just hospitalized in June for sepsis secondary to UTI.      Physical Exam   Triage Vital Signs: ED Triage Vitals  Encounter Vitals Group     BP 11/17/22 2242 (!) 123/95     Systolic BP Percentile --      Diastolic BP Percentile --      Pulse Rate 11/17/22 2242 (!) 113     Resp 11/17/22 2242 20     Temp 11/17/22 2242 98.3 F (36.8 C)     Temp Source 11/17/22 2242 Oral     SpO2 11/17/22 2240 99 %     Weight 11/17/22 2242 173 lb (78.5 kg)     Height 11/17/22 2242 5\' 6"  (1.676 m)     Head Circumference --      Peak Flow --      Pain Score 11/17/22 2242 0     Pain Loc --      Pain Education --      Exclude from Growth Chart --     Most recent vital signs: Vitals:   11/18/22 0615 11/18/22 0645  BP:    Pulse: 96 98  Resp: 19 15  Temp:    SpO2: 99% 98%     General: Awake, no distress.  CV:  Good peripheral perfusion.  Tachycardic, irregularly irregular. Resp:  Normal work of breathing.  Coarse lung  sounds bilaterally. Abd:  No distention.  No significant tense ascites.  No guarding or rebound. Other:  Dried emesis around mouth and on close.  Appears very fatigued.  Moves all extremities with 5-5 strength.  Cranial nerves intact.  Gait deferred.   ED Results / Procedures / Treatments   Labs (all labs ordered are listed, but only abnormal results are displayed) Labs Reviewed  CBC WITH DIFFERENTIAL/PLATELET - Abnormal; Notable for the following components:      Result Value   WBC 3.3 (*)    RBC 3.62 (*)    Hemoglobin 11.4 (*)    HCT 34.2 (*)    Platelets 129 (*)    Neutro Abs 1.2 (*)    All other components within normal limits  COMPREHENSIVE METABOLIC PANEL - Abnormal; Notable for the following components:   Glucose, Bld 136 (*)    BUN 33 (*)    Creatinine, Ser 1.47 (*)    Calcium 7.7 (*)    Total  Protein 8.8 (*)    Albumin 2.9 (*)    AST 44 (*)    GFR, Estimated 38 (*)    All other components within normal limits  BRAIN NATRIURETIC PEPTIDE - Abnormal; Notable for the following components:   B Natriuretic Peptide 152.5 (*)    All other components within normal limits  URINALYSIS, ROUTINE W REFLEX MICROSCOPIC - Abnormal; Notable for the following components:   Color, Urine YELLOW (*)    APPearance HAZY (*)    Leukocytes,Ua MODERATE (*)    Bacteria, UA FEW (*)    All other components within normal limits  LACTIC ACID, PLASMA - Abnormal; Notable for the following components:   Lactic Acid, Venous 2.8 (*)    All other components within normal limits  LACTIC ACID, PLASMA - Abnormal; Notable for the following components:   Lactic Acid, Venous 2.8 (*)    All other components within normal limits  PROTIME-INR - Abnormal; Notable for the following components:   Prothrombin Time 19.8 (*)    INR 1.7 (*)    All other components within normal limits  CULTURE, BLOOD (SINGLE)  MAGNESIUM  TSH  PROCALCITONIN  CORTISOL-AM, BLOOD  HEMOGLOBIN A1C     EKG Atrial flutter,  ventricular rate 115.  QRS 90, QTc 46.  Acute ST elevations or depressions.  No ischemia or infarct.   RADIOLOGY CT head: No acute abnormality Chest x-ray: Clear CT on/pelvis: Cholelithiasis without evidence of acute cholecystitis, otherwise unremarkable   I also independently reviewed and agree with radiologist interpretations.   PROCEDURES:  Critical Care performed: Yes, see critical care procedure note(s)  .Critical Care  Performed by: Shaune Pollack, MD Authorized by: Shaune Pollack, MD   Critical care provider statement:    Critical care time (minutes):  30   Critical care time was exclusive of:  Separately billable procedures and treating other patients   Critical care was necessary to treat or prevent imminent or life-threatening deterioration of the following conditions:  Cardiac failure, circulatory failure, respiratory failure and sepsis   Critical care was time spent personally by me on the following activities:  Development of treatment plan with patient or surrogate, discussions with consultants, evaluation of patient's response to treatment, examination of patient, ordering and review of laboratory studies, ordering and review of radiographic studies, ordering and performing treatments and interventions, pulse oximetry, re-evaluation of patient's condition and review of old charts     MEDICATIONS ORDERED IN ED: Medications  metoprolol tartrate (LOPRESSOR) tablet 12.5 mg (has no administration in time range)  escitalopram (LEXAPRO) tablet 10 mg (has no administration in time range)  mirtazapine (REMERON) tablet 7.5 mg (has no administration in time range)  traZODone (DESYREL) tablet 50 mg (has no administration in time range)  insulin glargine-yfgn (SEMGLEE) injection 10 Units (has no administration in time range)  apixaban (ELIQUIS) tablet 5 mg (has no administration in time range)  allopurinol (ZYLOPRIM) tablet 100 mg (has no administration in time range)   lactated ringers infusion (150 mL/hr Intravenous New Bag/Given 11/18/22 0443)  acetaminophen (TYLENOL) tablet 650 mg (has no administration in time range)    Or  acetaminophen (TYLENOL) suppository 650 mg (has no administration in time range)  HYDROcodone-acetaminophen (NORCO/VICODIN) 5-325 MG per tablet 1-2 tablet (has no administration in time range)  ondansetron (ZOFRAN) tablet 4 mg (has no administration in time range)    Or  ondansetron (ZOFRAN) injection 4 mg (has no administration in time range)  insulin aspart (novoLOG) injection 0-15 Units (has no  administration in time range)  insulin aspart (novoLOG) injection 0-5 Units (has no administration in time range)  ceFEPIme (MAXIPIME) 2 g in sodium chloride 0.9 % 100 mL IVPB (has no administration in time range)  sodium chloride 0.9 % bolus 1,000 mL (0 mLs Intravenous Stopped 11/18/22 0208)  ondansetron (ZOFRAN) injection 4 mg (4 mg Intravenous Given 11/18/22 0040)  diltiazem (CARDIZEM) injection 10 mg (10 mg Intravenous Given 11/18/22 0040)  sodium chloride 0.9 % bolus 1,000 mL (0 mLs Intravenous Stopped 11/18/22 0254)  ceFEPIme (MAXIPIME) 2 g in sodium chloride 0.9 % 100 mL IVPB (0 g Intravenous Stopped 11/18/22 0208)  iohexol (OMNIPAQUE) 300 MG/ML solution 75 mL (75 mLs Intravenous Contrast Given 11/18/22 0100)     IMPRESSION / MDM / ASSESSMENT AND PLAN / ED COURSE  I reviewed the triage vital signs and the nursing notes.                              Differential diagnosis includes, but is not limited to, symptomatic afib, anemia, occult infection (UTI, PNA), obstruction, gastritis, enteritis, UGIB, demand ischemia/ACS  Patient's presentation is most consistent with acute presentation with potential threat to life or bodily function.  The patient is on the cardiac monitor to evaluate for evidence of arrhythmia and/or significant heart rate changes  67 year old female here with generalized weakness, nausea, vomiting.  Suspect possible UTI  with sepsis.  Patient has a history of the same.  Urinalysis shows significant pyuria and few bacteria.  Otherwise, CBC shows mild leukopenia, baseline anemia.  CMP with possible mild dehydration.  Mag normal.  CT of the abdomen and pelvis obtained and is unremarkable.  CT head negative.  Chest x-ray is clear.  Lactic acid 2.8 which is stable.  Patient given IV antibiotics, fluids, and will admit to medicine.   FINAL CLINICAL IMPRESSION(S) / ED DIAGNOSES   Final diagnoses:  Atrial fibrillation with rapid ventricular response (HCC)  Nausea and vomiting, unspecified vomiting type     Rx / DC Orders   ED Discharge Orders     None        Note:  This document was prepared using Dragon voice recognition software and may include unintentional dictation errors.   Shaune Pollack, MD 11/18/22 0700

## 2022-11-17 NOTE — ED Notes (Signed)
Pt taken to radiology

## 2022-11-17 NOTE — ED Triage Notes (Signed)
Pt coming from home via [facility], called due to a dizzy spell. EMS found pt tachycardic, afib on the monitor, no LOC. Pt A&Ox4, no obvious distress noted. Pt denies chest pain, endorses SOB with exertion.

## 2022-11-18 ENCOUNTER — Encounter: Payer: Self-pay | Admitting: Internal Medicine

## 2022-11-18 ENCOUNTER — Emergency Department: Payer: 59

## 2022-11-18 DIAGNOSIS — Z7901 Long term (current) use of anticoagulants: Secondary | ICD-10-CM | POA: Diagnosis not present

## 2022-11-18 DIAGNOSIS — I4891 Unspecified atrial fibrillation: Secondary | ICD-10-CM

## 2022-11-18 DIAGNOSIS — K7030 Alcoholic cirrhosis of liver without ascites: Secondary | ICD-10-CM | POA: Diagnosis present

## 2022-11-18 DIAGNOSIS — G40909 Epilepsy, unspecified, not intractable, without status epilepticus: Secondary | ICD-10-CM | POA: Diagnosis present

## 2022-11-18 DIAGNOSIS — Z8505 Personal history of malignant neoplasm of liver: Secondary | ICD-10-CM | POA: Diagnosis not present

## 2022-11-18 DIAGNOSIS — A419 Sepsis, unspecified organism: Secondary | ICD-10-CM | POA: Diagnosis present

## 2022-11-18 DIAGNOSIS — N390 Urinary tract infection, site not specified: Secondary | ICD-10-CM | POA: Diagnosis present

## 2022-11-18 DIAGNOSIS — R54 Age-related physical debility: Secondary | ICD-10-CM | POA: Diagnosis present

## 2022-11-18 DIAGNOSIS — C220 Liver cell carcinoma: Secondary | ICD-10-CM | POA: Diagnosis present

## 2022-11-18 DIAGNOSIS — I5032 Chronic diastolic (congestive) heart failure: Secondary | ICD-10-CM | POA: Diagnosis present

## 2022-11-18 DIAGNOSIS — I480 Paroxysmal atrial fibrillation: Secondary | ICD-10-CM | POA: Diagnosis present

## 2022-11-18 DIAGNOSIS — I4892 Unspecified atrial flutter: Secondary | ICD-10-CM | POA: Diagnosis present

## 2022-11-18 DIAGNOSIS — Z794 Long term (current) use of insulin: Secondary | ICD-10-CM | POA: Diagnosis not present

## 2022-11-18 DIAGNOSIS — B9620 Unspecified Escherichia coli [E. coli] as the cause of diseases classified elsewhere: Secondary | ICD-10-CM | POA: Diagnosis present

## 2022-11-18 DIAGNOSIS — E785 Hyperlipidemia, unspecified: Secondary | ICD-10-CM | POA: Diagnosis present

## 2022-11-18 DIAGNOSIS — I482 Chronic atrial fibrillation, unspecified: Secondary | ICD-10-CM

## 2022-11-18 DIAGNOSIS — F419 Anxiety disorder, unspecified: Secondary | ICD-10-CM | POA: Diagnosis present

## 2022-11-18 DIAGNOSIS — E1122 Type 2 diabetes mellitus with diabetic chronic kidney disease: Secondary | ICD-10-CM | POA: Diagnosis present

## 2022-11-18 DIAGNOSIS — Z8249 Family history of ischemic heart disease and other diseases of the circulatory system: Secondary | ICD-10-CM | POA: Diagnosis not present

## 2022-11-18 DIAGNOSIS — N1832 Chronic kidney disease, stage 3b: Secondary | ICD-10-CM | POA: Diagnosis present

## 2022-11-18 DIAGNOSIS — Z66 Do not resuscitate: Secondary | ICD-10-CM | POA: Diagnosis present

## 2022-11-18 DIAGNOSIS — R6520 Severe sepsis without septic shock: Secondary | ICD-10-CM | POA: Diagnosis present

## 2022-11-18 DIAGNOSIS — N3000 Acute cystitis without hematuria: Secondary | ICD-10-CM

## 2022-11-18 DIAGNOSIS — A4151 Sepsis due to Escherichia coli [E. coli]: Secondary | ICD-10-CM | POA: Diagnosis not present

## 2022-11-18 DIAGNOSIS — I130 Hypertensive heart and chronic kidney disease with heart failure and stage 1 through stage 4 chronic kidney disease, or unspecified chronic kidney disease: Secondary | ICD-10-CM | POA: Diagnosis present

## 2022-11-18 DIAGNOSIS — B952 Enterococcus as the cause of diseases classified elsewhere: Secondary | ICD-10-CM | POA: Diagnosis present

## 2022-11-18 DIAGNOSIS — D61818 Other pancytopenia: Secondary | ICD-10-CM | POA: Diagnosis present

## 2022-11-18 DIAGNOSIS — R6510 Systemic inflammatory response syndrome (SIRS) of non-infectious origin without acute organ dysfunction: Secondary | ICD-10-CM

## 2022-11-18 LAB — GLUCOSE, CAPILLARY
Glucose-Capillary: 111 mg/dL — ABNORMAL HIGH (ref 70–99)
Glucose-Capillary: 168 mg/dL — ABNORMAL HIGH (ref 70–99)
Glucose-Capillary: 55 mg/dL — ABNORMAL LOW (ref 70–99)
Glucose-Capillary: 60 mg/dL — ABNORMAL LOW (ref 70–99)
Glucose-Capillary: 70 mg/dL (ref 70–99)

## 2022-11-18 LAB — CORTISOL-AM, BLOOD: Cortisol - AM: 6.5 ug/dL — ABNORMAL LOW (ref 6.7–22.6)

## 2022-11-18 LAB — CULTURE, BLOOD (SINGLE)
Culture: NO GROWTH
Special Requests: ADEQUATE

## 2022-11-18 LAB — HEMOGLOBIN A1C
Hgb A1c MFr Bld: 5.8 % — ABNORMAL HIGH (ref 4.8–5.6)
Mean Plasma Glucose: 119.76 mg/dL

## 2022-11-18 LAB — PROCALCITONIN: Procalcitonin: <0.1 ng/mL

## 2022-11-18 LAB — PROTIME-INR
INR: 1.7 — ABNORMAL HIGH (ref 0.8–1.2)
Prothrombin Time: 19.8 seconds — ABNORMAL HIGH (ref 11.4–15.2)

## 2022-11-18 LAB — CBG MONITORING, ED: Glucose-Capillary: 85 mg/dL (ref 70–99)

## 2022-11-18 MED ORDER — MIRTAZAPINE 15 MG PO TABS
7.5000 mg | ORAL_TABLET | Freq: Every day | ORAL | Status: DC
  Administered 2022-11-18 – 2022-11-20 (×3): 7.5 mg via ORAL
  Filled 2022-11-18 (×3): qty 1

## 2022-11-18 MED ORDER — ALLOPURINOL 100 MG PO TABS
100.0000 mg | ORAL_TABLET | Freq: Every day | ORAL | Status: DC
  Administered 2022-11-18 – 2022-11-21 (×4): 100 mg via ORAL
  Filled 2022-11-18 (×5): qty 1

## 2022-11-18 MED ORDER — ESCITALOPRAM OXALATE 10 MG PO TABS
10.0000 mg | ORAL_TABLET | Freq: Every day | ORAL | Status: DC
  Administered 2022-11-18 – 2022-11-21 (×4): 10 mg via ORAL
  Filled 2022-11-18 (×4): qty 1

## 2022-11-18 MED ORDER — INSULIN ASPART 100 UNIT/ML IJ SOLN: 0.0000 [IU] | Freq: Every day | INTRAMUSCULAR | Status: DC

## 2022-11-18 MED ORDER — IOHEXOL 300 MG/ML  SOLN
75.0000 mL | Freq: Once | INTRAMUSCULAR | Status: AC | PRN
  Administered 2022-11-18: 75 mL via INTRAVENOUS

## 2022-11-18 MED ORDER — METOPROLOL TARTRATE 25 MG PO TABS
12.5000 mg | ORAL_TABLET | Freq: Two times a day (BID) | ORAL | Status: DC
  Administered 2022-11-18 – 2022-11-21 (×7): 12.5 mg via ORAL
  Filled 2022-11-18 (×7): qty 1

## 2022-11-18 MED ORDER — ONDANSETRON HCL 4 MG/2ML IJ SOLN: 4.0000 mg | Freq: Four times a day (QID) | INTRAMUSCULAR | Status: DC | PRN

## 2022-11-18 MED ORDER — TRAZODONE HCL 50 MG PO TABS
50.0000 mg | ORAL_TABLET | Freq: Every day | ORAL | Status: DC
  Administered 2022-11-18 – 2022-11-20 (×3): 50 mg via ORAL
  Filled 2022-11-18 (×3): qty 1

## 2022-11-18 MED ORDER — APIXABAN 5 MG PO TABS
5.0000 mg | ORAL_TABLET | Freq: Two times a day (BID) | ORAL | Status: DC
  Administered 2022-11-18 – 2022-11-21 (×7): 5 mg via ORAL
  Filled 2022-11-18 (×7): qty 1

## 2022-11-18 MED ORDER — ACETAMINOPHEN 325 MG PO TABS: 650.0000 mg | ORAL_TABLET | Freq: Four times a day (QID) | ORAL | Status: DC | PRN

## 2022-11-18 MED ORDER — LACTATED RINGERS IV SOLN
150.0000 mL/h | INTRAVENOUS | Status: DC
  Administered 2022-11-18: 150 mL/h via INTRAVENOUS

## 2022-11-18 MED ORDER — SODIUM CHLORIDE 0.9 % IV SOLN
2.0000 g | Freq: Two times a day (BID) | INTRAVENOUS | Status: DC
  Administered 2022-11-18 – 2022-11-20 (×5): 2 g via INTRAVENOUS
  Filled 2022-11-18 (×5): qty 12.5

## 2022-11-18 MED ORDER — SODIUM CHLORIDE 0.9 % IV SOLN
2.0000 g | Freq: Once | INTRAVENOUS | Status: AC
  Administered 2022-11-18: 2 g via INTRAVENOUS
  Filled 2022-11-18: qty 12.5

## 2022-11-18 MED ORDER — ONDANSETRON HCL 4 MG PO TABS: 4.0000 mg | ORAL_TABLET | Freq: Four times a day (QID) | ORAL | Status: DC | PRN

## 2022-11-18 MED ORDER — INSULIN GLARGINE-YFGN 100 UNIT/ML ~~LOC~~ SOLN
10.0000 [IU] | Freq: Every day | SUBCUTANEOUS | Status: DC
  Administered 2022-11-18: 10 [IU] via SUBCUTANEOUS
  Filled 2022-11-18 (×3): qty 0.1

## 2022-11-18 MED ORDER — INSULIN ASPART 100 UNIT/ML IJ SOLN
0.0000 [IU] | Freq: Three times a day (TID) | INTRAMUSCULAR | Status: DC
  Administered 2022-11-18 – 2022-11-19 (×3): 3 [IU] via SUBCUTANEOUS
  Administered 2022-11-20 (×2): 2 [IU] via SUBCUTANEOUS
  Filled 2022-11-18 (×5): qty 1

## 2022-11-18 MED ORDER — SODIUM CHLORIDE 0.9 % IV BOLUS (SEPSIS)
1000.0000 mL | Freq: Once | INTRAVENOUS | Status: AC
  Administered 2022-11-18: 1000 mL via INTRAVENOUS

## 2022-11-18 MED ORDER — SODIUM CHLORIDE 0.9 % IV SOLN: 2.0000 g | Freq: Two times a day (BID) | INTRAVENOUS | Status: DC

## 2022-11-18 MED ORDER — COSYNTROPIN 0.25 MG IJ SOLR
0.2500 mg | Freq: Once | INTRAMUSCULAR | Status: AC
  Administered 2022-11-19: 0.25 mg via INTRAVENOUS
  Filled 2022-11-18 (×2): qty 0.25

## 2022-11-18 MED ORDER — HYDROCODONE-ACETAMINOPHEN 5-325 MG PO TABS
1.0000 | ORAL_TABLET | ORAL | Status: DC | PRN
  Administered 2022-11-19: 2 via ORAL
  Filled 2022-11-18: qty 2

## 2022-11-18 MED ORDER — ACETAMINOPHEN 650 MG RE SUPP: 650.0000 mg | Freq: Four times a day (QID) | RECTAL | Status: DC | PRN

## 2022-11-18 NOTE — Progress Notes (Signed)
Pharmacy Antibiotic Note  Adriana Miller is a 67 y.o. female admitted on 11/17/2022 with sepsis and UTI.  Pharmacy has been consulted for Cefepime dosing.  Plan: Cefepime 2 gm IV X 1 given in ED on 8/1 @ 0116. Cefepime 2 gm IV Q12H ordered to continue on 8/1 @ 1300.   Height: 5\' 6"  (167.6 cm) Weight: 78.5 kg (173 lb) IBW/kg (Calculated) : 59.3  Temp (24hrs), Avg:98.3 F (36.8 C), Min:98.3 F (36.8 C), Max:98.3 F (36.8 C)  Recent Labs  Lab 11/17/22 2307 11/17/22 2320 11/18/22 0254  WBC 3.3*  --   --   CREATININE 1.47*  --   --   LATICACIDVEN  --  2.8* 2.8*    Estimated Creatinine Clearance: 38.2 mL/min (A) (by C-G formula based on SCr of 1.47 mg/dL (H)).    Allergies  Allergen Reactions   Aspirin Other (See Comments)    Nose bleed    Antimicrobials this admission:   >>    >>   Dose adjustments this admission:   Microbiology results:  BCx:   UCx:    Sputum:    MRSA PCR:   Thank you for allowing pharmacy to be a part of this patient's care.  Aiyanna Awtrey D 11/18/2022 4:20 AM

## 2022-11-18 NOTE — H&P (Signed)
History and Physical    Patient: Adriana Miller DOB: [birth year] DOA: 11/17/2022 DOS: the patient was seen and examined on 11/18/2022 PCP: Carlean Jews, PA-C  Patient coming from: Home  Chief Complaint:  Chief Complaint  Patient presents with   Dizziness    HPI: Adriana Miller is a 67 y.o. female with medical history significant for hypertension,diabetes mellitus, diastolic CHF, A-fib on Eliquis, depression, CKD-3A, alcoholic cirrhosis, thrombocytopenia, hepatocellular carcinoma, hospitalized from 6/12 to 6/15 with severe sepsis secondary to E. coli UTI who presents to the ED with a 2-day history of dysuria, weakness, unable to get out of bed as well as lightheadedness and a couple episodes of nonbloody nonbilious vomiting.  Presentation was similar to prior episodes of UTI and sepsis.  She denies fever or chills. ED course and data review: Tachycardic to the 1 teens on arrival with otherwise normal vitals labs significant for pancytopenia with WBC 3.3, hemoglobin 11.4 and platelets 129.  Creatinine at baseline, lactic acid 2.8, BNP 152, urinalysis showing moderate leukocyte esterase and few bacteria. EKG, personally viewed and interpreted shows a flutter at 115 CT abdomen and pelvis nonacute, shows cholelithiasis without cholecystitis as well as hepatic steatosis. Chest x-ray with no active disease and head CT nonacute. Patient was treated with a diltiazem bolus 10 mg for rapid A-fib and given a fluid bolus of 1 L with improvement in rate to the 90s. She was started on cefepime for UTI Hospitalist consulted for admission     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)    B12 deficiency 04/05/2017   Blood in stool    Cerebral aneurysm    Chronic kidney disease    Diabetes mellitus without complication (HCC)    type 2   Gastric erosion with bleeding    Hepatocellular carcinoma (HCC)    Hyperlipidemia     Hypertension    Lower extremity cellulitis 02/08/2019   Paroxysmal A-fib (HCC)    Seizure disorder [facility])    Past Surgical History:  Procedure Laterality Date   CEREBRAL ANEURYSM REPAIR  1991   COLONOSCOPY WITH PROPOFOL N/A 02/12/2019   Procedure: COLONOSCOPY WITH PROPOFOL;  Surgeon: Toney Reil, MD;  Location: [facility] ENDOSCOPY;  Service: Gastroenterology;  Laterality: N/A;   ESOPHAGOGASTRODUODENOSCOPY (EGD) WITH PROPOFOL N/A 02/12/2019   Procedure: ESOPHAGOGASTRODUODENOSCOPY (EGD) WITH PROPOFOL;  Surgeon: Toney Reil, MD;  Location: [facility] ENDOSCOPY;  Service: Gastroenterology;  Laterality: N/A;   IR ANGIOGRAM SELECTIVE EACH ADDITIONAL VESSEL  09/21/2019   IR ANGIOGRAM SELECTIVE EACH ADDITIONAL VESSEL  09/21/2019   IR ANGIOGRAM SELECTIVE EACH ADDITIONAL VESSEL  09/21/2019   IR ANGIOGRAM SELECTIVE EACH ADDITIONAL VESSEL  09/21/2019   IR ANGIOGRAM SELECTIVE EACH ADDITIONAL VESSEL  10/02/2019   IR ANGIOGRAM VISCERAL SELECTIVE  09/21/2019   IR ANGIOGRAM VISCERAL SELECTIVE  09/21/2019   IR ANGIOGRAM VISCERAL SELECTIVE  10/02/2019   IR EMBO TUMOR ORGAN ISCHEMIA INFARCT INC GUIDE ROADMAPPING  10/02/2019   IR EMBO TUMOR ORGAN ISCHEMIA INFARCT INC GUIDE ROADMAPPING  10/02/2019   IR RADIOLOGIST EVAL & MGMT  08/28/2019   IR RADIOLOGIST EVAL & MGMT  01/30/2020   IR RADIOLOGIST EVAL & MGMT  04/30/2020   IR RADIOLOGIST EVAL & MGMT  07/30/2020   IR RADIOLOGIST EVAL & MGMT  10/28/2020   IR RADIOLOGIST EVAL & MGMT  02/24/2021   IR RADIOLOGIST EVAL & MGMT  06/08/2021   IR RADIOLOGIST EVAL & MGMT  11/24/2021   IR  RADIOLOGIST EVAL & MGMT  05/25/2022   IR US GUIDE VASC ACCESS LEFT  09/21/2019   IR US GUIDE VASC ACCESS LEFT  10/02/2019   OPEN REDUCTION INTERNAL FIXATION (ORIF) DISTAL RADIAL FRACTURE Right 08/06/2021   Procedure: OPEN REDUCTION INTERNAL FIXATION (ORIF) DISTAL RADIAL FRACTURE;  Surgeon: Kennedy Bucker, MD;  Location: [facility] ORS;  Service: Orthopedics;  Laterality: Right;   Social History:  reports that she  has never smoked. Her smokeless tobacco use includes snuff. She reports that she does not currently use alcohol after a past usage of about 14.0 standard drinks of alcohol per week. She reports that she does not use drugs.  Allergies  Allergen Reactions   Aspirin Other (See Comments)    Nose bleed    Family History  Problem Relation Age of Onset   Heart attack Brother 33    Prior to Admission medications   Medication Sig Start Date End Date Taking? Authorizing Provider  acetaminophen (TYLENOL) 325 MG tablet Take 2 tablets (650 mg total) by mouth every 6 (six) hours as needed for mild pain (or Fever >/= 101). 06/19/22   Loyce Dys, MD  Alcohol Swabs (B-D SINGLE USE SWABS REGULAR) PADS USE AS DIRECTED TWICE DAILY 07/26/22   McDonough, Salomon Fick, PA-C  allopurinol (ZYLOPRIM) 100 MG tablet TAKE 1 TABLET BY MOUTH DAILY 05/17/22   McDonough, Salomon Fick, PA-C  Blood Glucose Monitoring Suppl (ACCU-CHEK GUIDE ME) w/Device KIT Use as directed  dx E11.65 07/30/22   McDonough, Salomon Fick, PA-C  Calcium Carb-Cholecalciferol (CALCIUM 500/D) 500-5 MG-MCG TABS Take 1 tab po daily 12/07/21   McDonough, Salomon Fick, PA-C  Continuous Blood Gluc Receiver (DEXCOM G7 RECEIVER) DEVI Use for continuous blood glucose monitoring  DX E11.65 11/09/21   McDonough, Lauren K, PA-C  Continuous Glucose Sensor (DEXCOM G7 SENSOR) MISC USE AS DIRECTED FOR CONTINUOUS  GLUCOSE MONITORING. CHANGE  SENSOR EVERY 10 DAYS 10/06/22   McDonough, Lauren K, PA-C  ELIQUIS 5 MG TABS tablet TAKE 1 TABLET BY MOUTH TWICE  DAILY 09/20/22   End, Cristal Deer, MD  ergocalciferol (VITAMIN D2) 1.25 MG (50000 UT) capsule Take 1 capsule (50,000 Units total) by mouth once a week. 05/13/22   McDonough, Lauren K, PA-C  escitalopram (LEXAPRO) 10 MG tablet TAKE 1 TABLET BY MOUTH DAILY FOR ANXIETY/SLEEP Patient taking differently: Take 10 mg by mouth daily. 07/26/22   McDonough, Salomon Fick, PA-C  folic acid (FOLVITE) 1 MG tablet Take 1 tablet (1 mg total) by mouth daily.  06/20/22   Loyce Dys, MD  furosemide (LASIX) 20 MG tablet TAKE 1 TABLET (20 MG TOTAL) BY MOUTH DAILY. Patient taking differently: Take 20 mg by mouth daily. TAKE 1 TABLET (20 MG TOTAL) BY MOUTH DAILY. 07/28/22   End, Cristal Deer, MD  glucose blood (ACCU-CHEK GUIDE) test strip Use as instructed twice a day DX E11.65 08/03/22   McDonough, Lauren K, PA-C  insulin glargine, 1 Unit Dial, (TOUJEO SOLOSTAR) 300 UNIT/ML Solostar Pen Inject 10 Units into the skin daily with supper. 10/02/22   Marrion Coy, MD  Insulin Pen Needle (BD PEN NEEDLE NANO 2ND GEN) 32G X 4 MM MISC USE AS DIRECTED WITH TOUJEO 07/26/22   McDonough, Lauren K, PA-C  metoprolol tartrate (LOPRESSOR) 25 MG tablet TAKE ONE-HALF TABLET BY MOUTH  TWICE DAILY 11/12/22   McDonough, Lauren K, PA-C  mirtazapine (REMERON) 7.5 MG tablet TAKE 1 TABLET BY MOUTH DAILY AT  BEDTIME Patient taking differently: Take 7.5 mg by mouth at bedtime. TAKE 1 TABLET  BY MOUTH DAILY AT  BEDTIME 07/26/22   McDonough, Lauren K, PA-C  potassium chloride (KLOR-CON) 8 MEQ tablet TAKE 1 TABLET BY MOUTH 3 TIMES  WEEKLY ON MONDAY, WEDNESDAY, AND FRIDAY 11/08/22   End, Cristal Deer, MD  Safety Lancets 28G MISC Use as directed once a daily Dx e11.65 07/30/22   McDonough, Salomon Fick, PA-C  spironolactone (ALDACTONE) 25 MG tablet TAKE 1 TABLET BY MOUTH DAILY FOR CIRRHOSIS OF LIVER Patient taking differently: Take 25 mg by mouth daily. 08/09/22   McDonough, Salomon Fick, PA-C  thiamine (VITAMIN B-1) 100 MG tablet Take 1 tablet (100 mg total) by mouth daily. 06/20/22   Loyce Dys, MD  traZODone (DESYREL) 50 MG tablet TAKE 1 TO 2 TABLETS BY MOUTH AT  BEDTIME FOR INSOMNIA 10/14/22   Carlean Jews, PA-C    Physical Exam: Vitals:   11/18/22 0200 11/18/22 0211 11/18/22 0245 11/18/22 0330  BP:  121/83    Pulse: (!) 114  94 (!) 111  Resp: 16  16 15   Temp:      TempSrc:      SpO2: 97%  94% 96%  Weight:      Height:       Physical Exam Vitals and nursing note reviewed.   Constitutional:      General: She is not in acute distress. HENT:     Head: Normocephalic and atraumatic.  Cardiovascular:     Rate and Rhythm: Tachycardia present. Rhythm irregular.     Heart sounds: Normal heart sounds.  Pulmonary:     Effort: Pulmonary effort is normal.     Breath sounds: Normal breath sounds.  Abdominal:     Palpations: Abdomen is soft.     Tenderness: There is no abdominal tenderness.  Neurological:     Mental Status: Mental status is at baseline.     Labs on Admission: I have personally reviewed following labs and imaging studies  CBC: Recent Labs  Lab 11/17/22 2307  WBC 3.3*  NEUTROABS 1.2*  HGB 11.4*  HCT 34.2*  MCV 94.5  PLT 129*   Basic Metabolic Panel: Recent Labs  Lab 11/17/22 2307  NA 135  K 3.7  CL 101  CO2 25  GLUCOSE 136*  BUN 33*  CREATININE 1.47*  CALCIUM 7.7*  MG 2.1   GFR: Estimated Creatinine Clearance: 38.2 mL/min (A) (by C-G formula based on SCr of 1.47 mg/dL (H)). Liver Function Tests: Recent Labs  Lab 11/17/22 2307  AST 44*  ALT 16  ALKPHOS 65  BILITOT 0.8  PROT 8.8*  ALBUMIN 2.9*   No results for input(s): "LIPASE", "AMYLASE" in the last 168 hours. No results for input(s): "AMMONIA" in the last 168 hours. Coagulation Profile: No results for input(s): "INR", "PROTIME" in the last 168 hours. Cardiac Enzymes: No results for input(s): "CKTOTAL", "CKMB", "CKMBINDEX", "TROPONINI" in the last 168 hours. BNP (last 3 results) No results for input(s): "PROBNP" in the last 8760 hours. HbA1C: No results for input(s): "HGBA1C" in the last 72 hours. CBG: No results for input(s): "GLUCAP" in the last 168 hours. Lipid Profile: No results for input(s): "CHOL", "HDL", "LDLCALC", "TRIG", "CHOLHDL", "LDLDIRECT" in the last 72 hours. Thyroid Function Tests: Recent Labs    11/17/22 2307  TSH 3.029   Anemia Panel: No results for input(s): "VITAMINB12", "FOLATE", "FERRITIN", "TIBC", "IRON", "RETICCTPCT" in the last 72  hours. Urine analysis:    Component Value Date/Time   COLORURINE YELLOW (A) 11/17/2022 2309   APPEARANCEUR HAZY (A) 11/17/2022 2309  APPEARANCEUR Hazy 10/11/2013 1705   LABSPEC 1.009 11/17/2022 2309   LABSPEC 1.012 10/11/2013 1705   PHURINE 5.0 11/17/2022 2309   GLUCOSEU NEGATIVE 11/17/2022 2309   GLUCOSEU >=500 10/11/2013 1705   HGBUR NEGATIVE 11/17/2022 2309   BILIRUBINUR NEGATIVE 11/17/2022 2309   BILIRUBINUR Negative 10/11/2013 1705   KETONESUR NEGATIVE 11/17/2022 2309   PROTEINUR NEGATIVE 11/17/2022 2309   NITRITE NEGATIVE 11/17/2022 2309   LEUKOCYTESUR MODERATE (A) 11/17/2022 2309   LEUKOCYTESUR 1+ 10/11/2013 1705    Radiological Exams on Admission: CT ABDOMEN PELVIS W CONTRAST  Result Date: 11/18/2022 CLINICAL DATA:  Tachycardia and dizziness. EXAM: CT ABDOMEN AND PELVIS WITH CONTRAST TECHNIQUE: Multidetector CT imaging of the abdomen and pelvis was performed using the standard protocol following bolus administration of intravenous contrast. RADIATION DOSE REDUCTION: This exam was performed according to the departmental dose-optimization program which includes automated exposure control, adjustment of the mA and/or kV according to patient size and/or use of iterative reconstruction technique. CONTRAST:  75mL OMNIPAQUE IOHEXOL 300 MG/ML  SOLN COMPARISON:  June 17, 2022 FINDINGS: Lower chest: No acute abnormality. Hepatobiliary: There is diffuse fatty infiltration of the liver parenchyma. No focal liver abnormality is seen. Numerous tiny gallstones are seen within the gallbladder lumen, without evidence of gallbladder wall thickening, pericholecystic inflammatory fat stranding or biliary dilatation. Pancreas: Unremarkable. No pancreatic ductal dilatation or surrounding inflammatory changes. Spleen: Normal in size without focal abnormality. Adrenals/Urinary Tract: Adrenal glands are unremarkable. Kidneys are normal in size, without obstructing renal calculi or hydronephrosis. A 4 mm  parenchymal calcification is seen within the mid right kidney. A subcentimeter simple cyst is also noted within the anterior aspect of the mid to upper left kidney. Bladder is unremarkable. Stomach/Bowel: There is a small hiatal hernia. Appendix appears normal. No evidence of bowel wall thickening, distention, or inflammatory changes. Vascular/Lymphatic: Marked severity calcification of the mesenteric vasculature is seen. No enlarged abdominal or pelvic lymph nodes. Reproductive: Ill-defined 1.5 cm x 1.0 cm uterine fibroid is suspected within the anterolateral aspect of the uterine fundus on the right (axial CT image 71, CT series 2). The bilateral adnexa are unremarkable. Other: No abdominal wall hernia or abnormality. No abdominopelvic ascites. Musculoskeletal: Multilevel degenerative changes seen throughout the lumbar spine. IMPRESSION: 1. Cholelithiasis without evidence of acute cholecystitis. 2. Hepatic steatosis. 3. Small hiatal hernia. 4. Small uterine fibroid. Electronically Signed   By: Aram Candela M.D.   On: 11/18/2022 01:16   DG Chest 2 View  Result Date: 11/17/2022 CLINICAL DATA:  Dizziness with tachycardia. EXAM: CHEST - 2 VIEW COMPARISON:  September 29, 2022 FINDINGS: The heart size and mediastinal contours are within normal limits. There is mild calcification of the aortic arch. Both lungs are clear. Multilevel degenerative changes seen throughout the thoracic spine. IMPRESSION: No active cardiopulmonary disease. Electronically Signed   By: Aram Candela M.D.   On: 11/17/2022 23:56   CT HEAD WO CONTRAST ( )  Result Date: 11/17/2022 CLINICAL DATA:  Mental status change EXAM: CT HEAD WITHOUT CONTRAST TECHNIQUE: Contiguous axial images were obtained from the base of the skull through the vertex without intravenous contrast. RADIATION DOSE REDUCTION: This exam was performed according to the departmental dose-optimization program which includes automated exposure control, adjustment of the  mA and/or kV according to patient size and/or use of iterative reconstruction technique. COMPARISON:  CT brain 09/22/2022 FINDINGS: Brain: No acute territorial infarction, hemorrhage or intracranial mass. Moderate atrophy. Mild chronic small vessel ischemic changes of the white matter. Stable ventricle size. Vascular: No hyperdense  vessels. Vertebral and carotid vascular calcification Skull: No fracture.  Right frontal parietal craniotomy Sinuses/Orbits: Old right medial orbital wall fracture. No acute findings Other: None IMPRESSION: 1. No CT evidence for acute intracranial abnormality. 2. Atrophy and chronic small vessel ischemic changes of the white matter. 3. Previous right craniotomy Electronically Signed   By: Jasmine Pang M.D.   On: 11/17/2022 23:56     Data Reviewed: Relevant notes from primary care and specialist visits, past discharge summaries as available in EHR, including Care Everywhere. Prior diagnostic testing as pertinent to current admission diagnoses Updated medications and problem lists for reconciliation ED course, including vitals, labs, imaging, treatment and response to treatment Triage notes, nursing and pharmacy notes and ED provider's notes Notable results as noted in HPI   Assessment and Plan: Urinary tract infection SIRS, possible sepsis Patient presents with tachycardia related to RVR, lactic acidosis, pancytopenia-not quite meeting sepsis criteria, however recently had E. coli urosepsis 7/1 to 7/3 and recently completed antibiotics Will continue cefepime I We will continue sepsis fluids until ruled out Follow cultures  Chronic atrial fibrillation with RVR (HCC) Rate was in the 1 teens on arrival improved with fluid bolus and diltiazem 10 mg IV bolus x 1 Continue apixaban and metoprolol  Essential hypertension Continue metoprolol  Chronic heart failure with preserved ejection fraction (HFpEF) (HCC) Clinically dry Hold spironolactone and furosemide during  initial sepsis treatment  Type 2 diabetes mellitus (HCC) Sliding scale insulin coverage Basal insulin  Hepatocellular carcinoma (HCC) No acute issues suspected  Frailty Patient with multiple comorbidities, frequent hospitalizations generalized weakness and general physical deconditioning Consider PT and TOC consult  Stage 3b chronic kidney disease (HCC) At baseline     DVT prophylaxis: Apixaban  Consults: none  Advance Care Planning:   Code Status: Prior   Family Communication: none  Disposition Plan: Back to previous home environment  Severity of Illness: The appropriate patient status for this patient is INPATIENT. Inpatient status is judged to be reasonable and necessary in order to provide the required intensity of service to ensure the patient's safety. The patient's presenting symptoms, physical exam findings, and initial radiographic and laboratory data in the context of their chronic comorbidities is felt to place them at high risk for further clinical deterioration. Furthermore, it is not anticipated that the patient will be medically stable for discharge from the hospital within 2 midnights of admission.   * I certify that at the point of admission it is my clinical judgment that the patient will require inpatient hospital care spanning beyond 2 midnights from the point of admission due to high intensity of service, high risk for further deterioration and high frequency of surveillance required.*  Author: Andris Baumann, MD 11/18/2022 3:53 AM  For on call review [email or web address].

## 2022-11-18 NOTE — Assessment & Plan Note (Signed)
Sliding scale insulin coverage Basal insulin 

## 2022-11-18 NOTE — Progress Notes (Signed)
PHARMACY -  BRIEF ANTIBIOTIC NOTE   Pharmacy has received consult(s) for Cefepime from an ED provider.  The patient's profile has been reviewed for ht/wt/allergies/indication/available labs.    One time order(s) placed for cefepime 2 gm IV X 1   Further antibiotics/pharmacy consults should be ordered by admitting physician if indicated.                       Thank you, Serrina Minogue D 11/18/2022  12:38 AM

## 2022-11-18 NOTE — Assessment & Plan Note (Signed)
Clinically dry Hold spironolactone and furosemide during initial sepsis treatment

## 2022-11-18 NOTE — Assessment & Plan Note (Signed)
No acute issues suspected 

## 2022-11-18 NOTE — Progress Notes (Signed)
Interval events noted.  Dizziness is better.  She has no complaints.  Vital signs are stable.  Continue IV cefepime for acute UTI.  Follow-up urine and blood cultures.

## 2022-11-18 NOTE — Assessment & Plan Note (Signed)
Patient with multiple comorbidities, frequent hospitalizations generalized weakness and general physical deconditioning Consider PT and TOC consult

## 2022-11-18 NOTE — Progress Notes (Signed)
Chap visited Pt on routine rounding. Pt shared traumatic childhood experiences. Chap supplied reflective listening and compassionate presence.   11/18/22 1100  Spiritual Encounters  Type of Visit Initial  Care provided to: Patient  Referral source Chaplain assessment  Reason for visit Routine spiritual support  OnCall Visit No  Interventions  Spiritual Care Interventions Made Reflective listening;Compassionate presence  Intervention Outcomes  Outcomes Awareness of support;Connection to spiritual care  Spiritual Care Plan  Spiritual Care Issues Still Outstanding No further spiritual care needs at this time (see row info)

## 2022-11-18 NOTE — Progress Notes (Signed)
CODE SEPSIS - PHARMACY COMMUNICATION  **Broad Spectrum Antibiotics should be administered within 1 hour of Sepsis diagnosis**  Time Code Sepsis Called/Page Received: 8/1 @ 0036  Antibiotics Ordered: 8/1 @ 0116   Time of 1st antibiotic administration:   Additional action taken by pharmacy:   If necessary, Name of Provider/Nurse Contacted:     Rolf Fells D ,PharmD Clinical Pharmacist  11/18/2022  1:37 AM

## 2022-11-18 NOTE — Sepsis Progress Note (Signed)
Elink monitoring for the code sepsis protocol.  

## 2022-11-18 NOTE — Assessment & Plan Note (Signed)
Continue metoprolol. 

## 2022-11-18 NOTE — Assessment & Plan Note (Signed)
Rate was in the 1 teens on arrival improved with fluid bolus and diltiazem 10 mg IV bolus x 1 Continue apixaban and metoprolol

## 2022-11-18 NOTE — Assessment & Plan Note (Signed)
At baseline 

## 2022-11-18 NOTE — Assessment & Plan Note (Signed)
SIRS, possible sepsis Patient presents with tachycardia related to RVR, lactic acidosis, pancytopenia-not quite meeting sepsis criteria, however recently had E. coli urosepsis 7/1 to 7/3 and recently completed antibiotics Will continue cefepime I We will continue sepsis fluids until ruled out Follow cultures

## 2022-11-19 DIAGNOSIS — A4151 Sepsis due to Escherichia coli [E. coli]: Secondary | ICD-10-CM | POA: Diagnosis not present

## 2022-11-19 LAB — GLUCOSE, CAPILLARY
Glucose-Capillary: 118 mg/dL — ABNORMAL HIGH (ref 70–99)
Glucose-Capillary: 195 mg/dL — ABNORMAL HIGH (ref 70–99)
Glucose-Capillary: 156 mg/dL — ABNORMAL HIGH (ref 70–99)
Glucose-Capillary: 121 mg/dL — ABNORMAL HIGH (ref 70–99)

## 2022-11-19 MED ORDER — INSULIN GLARGINE-YFGN 100 UNIT/ML ~~LOC~~ SOLN
5.0000 [IU] | Freq: Every day | SUBCUTANEOUS | Status: DC
  Administered 2022-11-19 – 2022-11-20 (×2): 5 [IU] via SUBCUTANEOUS
  Filled 2022-11-19 (×2): qty 0.05

## 2022-11-19 MED ORDER — SODIUM CHLORIDE 0.9 % IV SOLN: INTRAVENOUS | Status: DC | PRN

## 2022-11-19 MED ORDER — SPIRONOLACTONE 25 MG PO TABS
25.0000 mg | ORAL_TABLET | Freq: Every day | ORAL | Status: DC
  Administered 2022-11-19 – 2022-11-21 (×3): 25 mg via ORAL
  Filled 2022-11-19 (×3): qty 1

## 2022-11-19 MED ORDER — FUROSEMIDE 20 MG PO TABS
20.0000 mg | ORAL_TABLET | Freq: Every day | ORAL | Status: DC
  Administered 2022-11-20 – 2022-11-21 (×2): 20 mg via ORAL
  Filled 2022-11-19 (×2): qty 1

## 2022-11-19 NOTE — Plan of Care (Signed)
  Problem: Fluid Volume: Goal: Hemodynamic stability will improve Outcome: Progressing   Problem: Clinical Measurements: Goal: Diagnostic test results will improve Outcome: Progressing Goal: Signs and symptoms of infection will decrease Outcome: Progressing   Problem: Respiratory: Goal: Ability to maintain adequate ventilation will improve Outcome: Progressing   Problem: Education: Goal: Ability to describe self-care measures that may prevent or decrease complications (Diabetes Survival Skills Education) will improve Outcome: Progressing

## 2022-11-19 NOTE — Progress Notes (Signed)
[facility] Liaison note:   This patient is currently enrolled in [facility] outpatient-based palliative care.  [facility] will continue to follow for discharge disposition.    Please call for any outpatient based palliative care related questions or concerns.   [facility] Liaison [phone]  [facility] Liaison  [phone]

## 2022-11-19 NOTE — Plan of Care (Signed)
  Problem: Fluid Volume: Goal: Hemodynamic stability will improve Outcome: Progressing   Problem: Clinical Measurements: Goal: Diagnostic test results will improve Outcome: Progressing Goal: Signs and symptoms of infection will decrease Outcome: Progressing   Problem: Respiratory: Goal: Ability to maintain adequate ventilation will improve Outcome: Progressing   Problem: Education: Goal: Ability to describe self-care measures that may prevent or decrease complications (Diabetes Survival Skills Education) will improve Outcome: Progressing   Problem: Activity: Goal: Risk for activity intolerance will decrease Outcome: Progressing

## 2022-11-19 NOTE — Progress Notes (Signed)
Attempt to reach patient to complete RA. TOC will attempt assessment at later time.

## 2022-11-19 NOTE — Consult Note (Signed)
[facility] Customer service manager [facility]) Accountable Care Organization (ACO) [facility] Liaison Note  11/19/2022  Adriana Miller 1955-09-17 [id number]  Location: [facility] RN Hospital Liaison screened the patient remotely at [facility].  Insurance: [employer]   Adriana Miller is a 68 y.o. female who is a Primary Care Patient of Lewis Moccasin, Salomon Fick, PA-C. The patient was screened for  readmission hospitalization with noted extreme risk score for unplanned readmission risk with 4 IP/1 ED in 6 months.  The patient was assessed for potential [facility]) Care Management service needs for post hospital transition for care coordination. Review of patient's electronic medical record reveals patient was admitted with Atrial Fibrillation. Liaison attempted outreach to explain care management services and offered a post hospital follow up call however unsuccessful. Will continue attempts on next business day if unavailable.  Will plan to make a referral for care coordinator for ongoing management of if pt returns to her residence.   Plan: [facility] Liaison will continue to follow progress and disposition to asess for post hospital community care coordination/management needs.  Referral request for community care coordination: pending disposition.   [facility] Care Management/Population Health does not replace or interfere with any arrangements made by the Inpatient Transition of Care team.   For questions contact:   Elliot Cousin, RN, [facility] Liaison [place]   Population Health Office Hours MTWF  8:00 am-6:00 pm Off on Thursday [phone] mobile [phone] [Office toll free line] Office Hours are M-F 8:30 - 5 pm 24 hour nurse advise line [phone] [email or web address]  [email or web address]

## 2022-11-19 NOTE — Progress Notes (Signed)
PHARMACY - PHYSICIAN COMMUNICATION CRITICAL VALUE ALERT - BLOOD CULTURE IDENTIFICATION (BCID)  Adriana Miller is an 67 y.o. female who presented to [facility] on 11/17/2022 with a chief complaint of sepsis.   Assessment:  GPC in 1 of 2 bottles, species not recognized.   Most likely contaminant.  (include suspected source if known)  Name of physician (or Provider) Contacted:  Manuela Schwartz, NP   Current antibiotics:  Cefepime 2 gm IV Q12H   Changes to prescribed antibiotics recommended:  Patient is on recommended antibiotics - No changes needed  Results for orders placed or performed during the hospital encounter of 11/17/22  Blood Culture ID Panel (Reflexed) (Collected: 11/18/2022 12:56 AM)  Result Value Ref Range   Enterococcus faecalis NOT DETECTED NOT DETECTED   Enterococcus Faecium NOT DETECTED NOT DETECTED   Listeria monocytogenes NOT DETECTED NOT DETECTED   Staphylococcus species NOT DETECTED NOT DETECTED   Staphylococcus aureus (BCID) NOT DETECTED NOT DETECTED   Staphylococcus epidermidis NOT DETECTED NOT DETECTED   Staphylococcus lugdunensis NOT DETECTED NOT DETECTED   Streptococcus species NOT DETECTED NOT DETECTED   Streptococcus agalactiae NOT DETECTED NOT DETECTED   Streptococcus pneumoniae NOT DETECTED NOT DETECTED   Streptococcus pyogenes NOT DETECTED NOT DETECTED   A.calcoaceticus-baumannii NOT DETECTED NOT DETECTED   Bacteroides fragilis NOT DETECTED NOT DETECTED   Enterobacterales NOT DETECTED NOT DETECTED   Enterobacter cloacae complex NOT DETECTED NOT DETECTED   Escherichia coli NOT DETECTED NOT DETECTED   Klebsiella aerogenes NOT DETECTED NOT DETECTED   Klebsiella oxytoca NOT DETECTED NOT DETECTED   Klebsiella pneumoniae NOT DETECTED NOT DETECTED   Proteus species NOT DETECTED NOT DETECTED   Salmonella species NOT DETECTED NOT DETECTED   Serratia marcescens NOT DETECTED NOT DETECTED   Haemophilus influenzae NOT DETECTED NOT DETECTED   Neisseria  meningitidis NOT DETECTED NOT DETECTED   Pseudomonas aeruginosa NOT DETECTED NOT DETECTED   Stenotrophomonas maltophilia NOT DETECTED NOT DETECTED   Candida albicans NOT DETECTED NOT DETECTED   Candida auris NOT DETECTED NOT DETECTED   Candida glabrata NOT DETECTED NOT DETECTED   Candida krusei NOT DETECTED NOT DETECTED   Candida parapsilosis NOT DETECTED NOT DETECTED   Candida tropicalis NOT DETECTED NOT DETECTED   Cryptococcus neoformans/gattii NOT DETECTED NOT DETECTED    , D 11/19/2022  5:32 AM

## 2022-11-19 NOTE — Inpatient Diabetes Management (Signed)
Inpatient Diabetes Program Recommendations  AACE/ADA: New Consensus Statement on Inpatient Glycemic Control (2015)  Target Ranges:  Prepandial:   less than 140 mg/dL      Peak postprandial:   less than 180 mg/dL (1-2 hours)      Critically ill patients:  140 - 180 mg/dL    Latest Reference Range & Units 11/18/22 04:30  Hemoglobin A1C 4.8 - 5.6 % 5.8 (H)    Latest Reference Range & Units 11/18/22 08:20 11/18/22 11:59 11/18/22 16:39 11/18/22 21:34 11/18/22 21:54 11/18/22 22:20  Glucose-Capillary 70 - 99 mg/dL 85 696 (H) 295 (H)  3 units Novolog  10 units Semglee 60 (L) 55 (L) 70  (H): Data is abnormally high (L): Data is abnormally low  Latest Reference Range & Units 11/19/22 08:07  Glucose-Capillary 70 - 99 mg/dL 284 (H)  (H): Data is abnormally high    Admit with: Urinary tract infection/ SIRS, possible sepsis  History: DM2, CHF, CKD  Home DM Meds: Dexcom G7 CGM       Toujeo 10 units QSupper        Current Orders: Sermglee 10 units QSupper      Novolog Moderate Correction Scale/ SSI (0-15 units) TID AC + HS    MD- Note Hypoglycemia yest at bedtime after getting Novolog SSI + Semglee insulin  Please consider:  1. Reduce Semglee to 5 units QSupper  2. Reduce Novolog SSI to the 0-9 unit (Sensitive) scale     --Will follow patient during hospitalization--  Ambrose Finland RN, MSN, CDCES Diabetes Coordinator Inpatient Glycemic Control Team Team Pager: [phone] (8a-5p)

## 2022-11-19 NOTE — Progress Notes (Addendum)
Progress Note    Adriana Miller  ZOX:[id number] DOB: [birth year]  DOA: 11/17/2022 PCP: Carlean Jews, PA-C      Brief Narrative:    Medical records reviewed and are as summarized below:  Adriana Miller is a 67 y.o. female with medical history significant for hypertension,diabetes mellitus, diastolic CHF, A-fib on Eliquis, depression, CKD-3A, alcoholic cirrhosis, thrombocytopenia, hepatocellular carcinoma, hospitalized from 6/12 to 6/15 with severe sepsis secondary to E. coli UTI.  She presented to the hospital with 2-day history of dysuria, general weakness, dizziness and vomiting.  Symptoms similar to symptoms she experienced when she had sepsis from UTI recently.   She was monitored to the hospital for severe sepsis secondary to acute UTI.     Assessment/Plan:   Principal Problem:   Sepsis (HCC) Active Problems:   Urinary tract infection   SIRS (systemic inflammatory response syndrome) (HCC)   Chronic atrial fibrillation with RVR (HCC)   Essential hypertension   Type 2 diabetes mellitus (HCC)   Chronic heart failure with preserved ejection fraction (HFpEF) (HCC)   Hepatocellular carcinoma (HCC)   Stage 3b chronic kidney disease (HCC)   Frailty    Body mass index is 27.92 kg/m.   Severe sepsis secondary to acute UTI  Lactic acid was 2.8 on admission.  Urine culture is growing E. coli.  Follow-up sensitivity report. 1 out of 2 blood culture bottles showed gram-positive cocci.  This is probably a contaminant.  Susceptibility report is pending. ACTH stimulation test was normal and adrenal insufficiency has been ruled out.   Chronic atrial fibrillation with RVR (HCC) Atrial flutter: Continue metoprolol and Eliquis    Essential hypertension Continue metoprolol    Chronic heart failure with preserved ejection fraction (HFpEF) (HCC) Resume Lasix and Aldactone    Type 2 diabetes mellitus (HCC), hypoglycemia Glucose dropped to 55 last  night. Decrease insulin glargine from 10 units to 5 units daily.    Alcoholic liver cirrhosis, hepatocellular carcinoma (HCC) No acute issues suspected    Pancytopenia Probably due to liver disease.   Frailty, debility She is she is at her baseline.  She is able to ambulate to the bathroom.   At home, she ambulates without a walker but she uses a walker when she goes out.  She is going to SNF is not an option and she prefers to go back home.    Stage 3b chronic kidney disease (HCC) At baseline    Diet Order             Diet heart healthy/carb modified Room service appropriate? No; Fluid consistency: Thin  Diet effective now                            Consultants: None  Procedures: None    Medications:    allopurinol  100 mg Oral Daily   apixaban  5 mg Oral BID   escitalopram  10 mg Oral Daily   [START ON 11/20/2022] furosemide  20 mg Oral Daily   insulin aspart  0-15 Units Subcutaneous TID WC   insulin aspart  0-5 Units Subcutaneous QHS   insulin glargine-yfgn  5 Units Subcutaneous Q supper   metoprolol tartrate  12.5 mg Oral BID   mirtazapine  7.5 mg Oral QHS   spironolactone  25 mg Oral Daily   traZODone  50 mg Oral QHS   Continuous Infusions:  sodium chloride Stopped (11/19/22 0218)   ceFEPime (MAXIPIME)  IV 2 g (11/19/22 1234)     Anti-infectives (From admission, onward)    Start     Dose/Rate Route Frequency Ordered Stop   11/18/22 1300  ceFEPIme (MAXIPIME) 2 g in sodium chloride 0.9 % 100 mL IVPB  Status:  Discontinued        2 g 200 mL/hr over 30 Minutes Intravenous Every 12 hours 11/18/22 0420 11/18/22 0422   11/18/22 1300  ceFEPIme (MAXIPIME) 2 g in sodium chloride 0.9 % 100 mL IVPB        2 g 200 mL/hr over 30 Minutes Intravenous Every 12 hours 11/18/22 0422 11/25/22 1259   11/18/22 0045  ceFEPIme (MAXIPIME) 2 g in sodium chloride 0.9 % 100 mL IVPB        2 g 200 mL/hr over 30 Minutes Intravenous  Once 11/18/22 0036 11/18/22  0208              Family Communication/Anticipated D/C date and plan/Code Status   DVT prophylaxis:  apixaban (ELIQUIS) tablet 5 mg     Code Status: DNR  Family Communication: None Disposition Plan: Plan to discharge home tomorrow   Status is: Inpatient Remains inpatient appropriate because: Sepsis from UTI on IV antibiotics       Subjective:   Interval events noted.  No complaints.  No dizziness, abdominal pain or urinary symptoms.  She feels better.  She was able to ambulate to the bathroom with assistance.  Alex, RN, was at the bedside  Objective:    Vitals:   11/19/22 0343 11/19/22 0805 11/19/22 1149 11/19/22 1544  BP: 138/70 128/60 (!) 146/70 127/60  Pulse: 79 72 76 73  Resp: 18 17 20 18   Temp: 99.4 F (37.4 C) 98.3 F (36.8 C) 98.2 F (36.8 C) 98.4 F (36.9 C)  TempSrc: Oral     SpO2: 99% 95% 98% 97%  Weight:      Height:       No data found.   Intake/Output Summary (Last 24 hours) at 11/19/2022 1611 Last data filed at 11/19/2022 1100 Gross per 24 hour  Intake 360 ml  Output --  Net 360 ml   Filed Weights   11/17/22 2242  Weight: 78.5 kg    Exam:  GEN: NAD SKIN: Warm and dry EYES: No pallor or icterus ENT: MMM CV: RRR PULM: CTA B ABD: soft, ND, NT, +BS CNS: AAO x 3, non focal EXT: No edema or tenderness        Data Reviewed:   I have personally reviewed following labs and imaging studies:  Labs: Labs show the following:   Basic Metabolic Panel: Recent Labs  Lab 11/17/22 2307 11/19/22 0605  NA 135 135  K 3.7 3.5  CL 101 104  CO2 25 26  GLUCOSE 136* 124*  BUN 33* 19  CREATININE 1.47* 1.23*  CALCIUM 7.7* 7.7*  MG 2.1  --    GFR Estimated Creatinine Clearance: 45.7 mL/min (A) (by C-G formula based on SCr of 1.23 mg/dL (H)). Liver Function Tests: Recent Labs  Lab 11/17/22 2307  AST 44*  ALT 16  ALKPHOS 65  BILITOT 0.8  PROT 8.8*  ALBUMIN 2.9*   No results for input(s): "LIPASE", "AMYLASE" in the last  168 hours. No results for input(s): "AMMONIA" in the last 168 hours. Coagulation profile Recent Labs  Lab 11/18/22 0430  INR 1.7*    CBC: Recent Labs  Lab 11/17/22 2307 11/19/22 0605  WBC 3.3* 2.5*  NEUTROABS 1.2* 1.2*  HGB 11.4*  9.1*  HCT 34.2* 26.7*  MCV 94.5 93.4  PLT 129* 99*   Cardiac Enzymes: No results for input(s): "CKTOTAL", "CKMB", "CKMBINDEX", "TROPONINI" in the last 168 hours. BNP (last 3 results) No results for input(s): "PROBNP" in the last 8760 hours. CBG: Recent Labs  Lab 11/18/22 12-01-52 11/18/22 2220 11/19/22 0807 11/19/22 1152 11/19/22 1546  GLUCAP 55* 70 118* 195* 156*   D-Dimer: No results for input(s): "DDIMER" in the last 72 hours. Hgb A1c: Recent Labs    11/18/22 0430  HGBA1C 5.8*   Lipid Profile: No results for input(s): "CHOL", "HDL", "LDLCALC", "TRIG", "CHOLHDL", "LDLDIRECT" in the last 72 hours. Thyroid function studies: Recent Labs    11/17/22 2305-12-01  TSH 3.029   Anemia work up: No results for input(s): "VITAMINB12", "FOLATE", "FERRITIN", "TIBC", "IRON", "RETICCTPCT" in the last 72 hours. Sepsis Labs: Recent Labs  Lab 11/17/22 12-01-2305 11/17/22 2320 11/18/22 0254 11/18/22 0430 11/19/22 8469  PROCALCITON  --   --   --  <0.10  --   WBC 3.3*  --   --   --  2.5*  LATICACIDVEN  --  2.8* 2.8*  --  1.2    Microbiology Recent Results (from the past 240 hour(s))  Urine Culture (for pregnant, neutropenic or urologic patients or patients with an indwelling urinary catheter)     Status: Abnormal (Preliminary result)   Collection Time: 11/17/22 11:09 PM   Specimen: Urine, Clean Catch  Result Value Ref Range Status   Specimen Description   Final    URINE, CLEAN CATCH Performed at [facility], [address], [place], [place] [address]    Special Requests   Final    NONE Performed at [facility], [address], [place], [place] [address]    Culture (A)  Final    80,000 COLONIES/mL ESCHERICHIA  COLI SUSCEPTIBILITIES TO FOLLOW 30,000 COLONIES/mL ENTEROCOCCUS FAECALIS CULTURE REINCUBATED FOR BETTER GROWTH Performed at [facility], [address], [place], [place] [address]    Report Status PENDING  Incomplete  Culture, blood (single)     Status: None (Preliminary result)   Collection Time: 11/18/22 12:56 AM   Specimen: BLOOD  Result Value Ref Range Status   Specimen Description BLOOD BLOOD RIGHT ARM  Final   Special Requests   Final    BOTTLES DRAWN AEROBIC AND ANAEROBIC Blood Culture adequate volume   Culture  Setup Time   Final    Organism ID to follow GRAM POSITIVE COCCI AEROBIC BOTTLE ONLY CRITICAL RESULT CALLED TO, READ BACK BY AND VERIFIED WITH: JASON ROBBINS AT 2725 11/19/22.PMF Performed at [facility], [address] [place], [place] [address]    Culture [facility] POSITIVE COCCI  Final   Report Status PENDING  Incomplete  Blood Culture ID Panel (Reflexed)     Status: None   Collection Time: 11/18/22 12:56 AM  Result Value Ref Range Status   Enterococcus faecalis NOT DETECTED NOT DETECTED Final   Enterococcus Faecium NOT DETECTED NOT DETECTED Final   Listeria monocytogenes NOT DETECTED NOT DETECTED Final   Staphylococcus species NOT DETECTED NOT DETECTED Final   Staphylococcus aureus (BCID) NOT DETECTED NOT DETECTED Final   Staphylococcus epidermidis NOT DETECTED NOT DETECTED Final   Staphylococcus lugdunensis NOT DETECTED NOT DETECTED Final   Streptococcus species NOT DETECTED NOT DETECTED Final   Streptococcus agalactiae NOT DETECTED NOT DETECTED Final   Streptococcus pneumoniae NOT DETECTED NOT DETECTED Final   Streptococcus pyogenes NOT DETECTED NOT DETECTED Final   A.calcoaceticus-baumannii  NOT DETECTED NOT DETECTED Final   Bacteroides fragilis NOT DETECTED NOT DETECTED Final   Enterobacterales NOT DETECTED NOT DETECTED Final   Enterobacter cloacae complex NOT DETECTED NOT DETECTED Final   Escherichia coli NOT DETECTED NOT DETECTED Final    Klebsiella aerogenes NOT DETECTED NOT DETECTED Final   Klebsiella oxytoca NOT DETECTED NOT DETECTED Final   Klebsiella pneumoniae NOT DETECTED NOT DETECTED Final   Proteus species NOT DETECTED NOT DETECTED Final   Salmonella species NOT DETECTED NOT DETECTED Final   Serratia marcescens NOT DETECTED NOT DETECTED Final   Haemophilus influenzae NOT DETECTED NOT DETECTED Final   Neisseria meningitidis NOT DETECTED NOT DETECTED Final   Pseudomonas aeruginosa NOT DETECTED NOT DETECTED Final   Stenotrophomonas maltophilia NOT DETECTED NOT DETECTED Final   Candida albicans NOT DETECTED NOT DETECTED Final   Candida auris NOT DETECTED NOT DETECTED Final   Candida glabrata NOT DETECTED NOT DETECTED Final   Candida krusei NOT DETECTED NOT DETECTED Final   Candida parapsilosis NOT DETECTED NOT DETECTED Final   Candida tropicalis NOT DETECTED NOT DETECTED Final   Cryptococcus neoformans/gattii NOT DETECTED NOT DETECTED Final    Comment: Performed at [facility], [address] [place], [place] [address]    Procedures and diagnostic studies:  CT ABDOMEN PELVIS W CONTRAST  Result Date: 11/18/2022 CLINICAL DATA:  Tachycardia and dizziness. EXAM: CT ABDOMEN AND PELVIS WITH CONTRAST TECHNIQUE: Multidetector CT imaging of the abdomen and pelvis was performed using the standard protocol following bolus administration of intravenous contrast. RADIATION DOSE REDUCTION: This exam was performed according to the departmental dose-optimization program which includes automated exposure control, adjustment of the mA and/or kV according to patient size and/or use of iterative reconstruction technique. CONTRAST:  75mL OMNIPAQUE IOHEXOL 300 MG/ML  SOLN COMPARISON:  June 17, 2022 FINDINGS: Lower chest: No acute abnormality. Hepatobiliary: There is diffuse fatty infiltration of the liver parenchyma. No focal liver abnormality is seen. Numerous tiny gallstones are seen within the gallbladder lumen, without  evidence of gallbladder wall thickening, pericholecystic inflammatory fat stranding or biliary dilatation. Pancreas: Unremarkable. No pancreatic ductal dilatation or surrounding inflammatory changes. Spleen: Normal in size without focal abnormality. Adrenals/Urinary Tract: Adrenal glands are unremarkable. Kidneys are normal in size, without obstructing renal calculi or hydronephrosis. A 4 mm parenchymal calcification is seen within the mid right kidney. A subcentimeter simple cyst is also noted within the anterior aspect of the mid to upper left kidney. Bladder is unremarkable. Stomach/Bowel: There is a small hiatal hernia. Appendix appears normal. No evidence of bowel wall thickening, distention, or inflammatory changes. Vascular/Lymphatic: Marked severity calcification of the mesenteric vasculature is seen. No enlarged abdominal or pelvic lymph nodes. Reproductive: Ill-defined 1.5 cm x 1.0 cm uterine fibroid is suspected within the anterolateral aspect of the uterine fundus on the right (axial CT image 71, CT series 2). The bilateral adnexa are unremarkable. Other: No abdominal wall hernia or abnormality. No abdominopelvic ascites. Musculoskeletal: Multilevel degenerative changes seen throughout the lumbar spine. IMPRESSION: 1. Cholelithiasis without evidence of acute cholecystitis. 2. Hepatic steatosis. 3. Small hiatal hernia. 4. Small uterine fibroid. Electronically Signed   By: Aram Candela M.D.   On: 11/18/2022 01:16   DG Chest 2 View  Result Date: 11/17/2022 CLINICAL DATA:  Dizziness with tachycardia. EXAM: CHEST - 2 VIEW COMPARISON:  September 29, 2022 FINDINGS: The heart size and mediastinal contours are within normal limits. There is mild calcification of the aortic arch. Both lungs are clear. Multilevel degenerative changes seen throughout the  thoracic spine. IMPRESSION: No active cardiopulmonary disease. Electronically Signed   By: Aram Candela M.D.   On: 11/17/2022 23:56   CT HEAD WO CONTRAST  ( )  Result Date: 11/17/2022 CLINICAL DATA:  Mental status change EXAM: CT HEAD WITHOUT CONTRAST TECHNIQUE: Contiguous axial images were obtained from the base of the skull through the vertex without intravenous contrast. RADIATION DOSE REDUCTION: This exam was performed according to the departmental dose-optimization program which includes automated exposure control, adjustment of the mA and/or kV according to patient size and/or use of iterative reconstruction technique. COMPARISON:  CT brain 09/22/2022 FINDINGS: Brain: No acute territorial infarction, hemorrhage or intracranial mass. Moderate atrophy. Mild chronic small vessel ischemic changes of the white matter. Stable ventricle size. Vascular: No hyperdense vessels. Vertebral and carotid vascular calcification Skull: No fracture.  Right frontal parietal craniotomy Sinuses/Orbits: Old right medial orbital wall fracture. No acute findings Other: None IMPRESSION: 1. No CT evidence for acute intracranial abnormality. 2. Atrophy and chronic small vessel ischemic changes of the white matter. 3. Previous right craniotomy Electronically Signed   By: Jasmine Pang M.D.   On: 11/17/2022 23:56               LOS: 1 day      [facility]   Pager on [email or web address]. If 7PM-7AM, please contact night-coverage at [email or web address]     11/19/2022, 4:11 PM

## 2022-11-20 DIAGNOSIS — A4151 Sepsis due to Escherichia coli [E. coli]: Secondary | ICD-10-CM | POA: Diagnosis not present

## 2022-11-20 LAB — GLUCOSE, CAPILLARY
Glucose-Capillary: 92 mg/dL (ref 70–99)
Glucose-Capillary: 142 mg/dL — ABNORMAL HIGH (ref 70–99)
Glucose-Capillary: 125 mg/dL — ABNORMAL HIGH (ref 70–99)
Glucose-Capillary: 115 mg/dL — ABNORMAL HIGH (ref 70–99)

## 2022-11-20 MED ORDER — SODIUM CHLORIDE 0.9 % IV SOLN
2.0000 g | INTRAVENOUS | Status: DC
  Administered 2022-11-20: 2 g via INTRAVENOUS
  Filled 2022-11-20 (×2): qty 20

## 2022-11-20 MED ORDER — AMOXICILLIN 500 MG PO CAPS
500.0000 mg | ORAL_CAPSULE | Freq: Three times a day (TID) | ORAL | Status: DC
  Administered 2022-11-20 – 2022-11-21 (×3): 500 mg via ORAL
  Filled 2022-11-20 (×6): qty 1

## 2022-11-20 NOTE — Progress Notes (Signed)
Wheelchair (Order [id number]) General Supply Date: 11/20/2022 Department: [facility] CARDIAC MED PCU Ordering/Authorizing: Lurene Shadow, MD     Lurene Shadow, MD NPI: [id number]    Patient Information  Patient Name Adriana Miller, Adriana Miller Legal Sex Female DOB August 15, 1955 SSN [id number]   Order Information  Order Date/Time Release Date/Time Start Date/Time End Date/Time  11/20/22 02:13 PM None 11/20/2022 None

## 2022-11-20 NOTE — Progress Notes (Signed)
Patient is being transferred to 202. Report given to [facility].

## 2022-11-20 NOTE — Progress Notes (Signed)
Progress Note    KENAE LINDQUIST  ZOX:[id number] DOB: [birth year]  DOA: 11/17/2022 PCP: Carlean Jews, PA-C      Brief Narrative:    Medical records reviewed and are as summarized below:  NAIARA Miller is a 67 y.o. female with medical history significant for hypertension,diabetes mellitus, diastolic CHF, A-fib on Eliquis, depression, CKD-3A, alcoholic cirrhosis, thrombocytopenia, hepatocellular carcinoma, hospitalized from 6/12 to 6/15 with severe sepsis secondary to E. coli UTI.  She presented to the hospital with 2-day history of dysuria, general weakness, dizziness and vomiting.  Symptoms similar to symptoms she experienced when she had sepsis from UTI recently.   She was monitored to the hospital for severe sepsis secondary to acute UTI.     Assessment/Plan:   Principal Problem:   Sepsis (HCC) Active Problems:   Urinary tract infection   SIRS (systemic inflammatory response syndrome) (HCC)   Chronic atrial fibrillation with RVR (HCC)   Essential hypertension   Type 2 diabetes mellitus (HCC)   Chronic heart failure with preserved ejection fraction (HFpEF) (HCC)   Hepatocellular carcinoma (HCC)   Stage 3b chronic kidney disease (HCC)   Frailty    Body mass index is 27.92 kg/m.   Severe sepsis secondary to acute UTI, recurrent UTI Lactic acid was 2.8 on admission.  Urine culture showed E. coli and Enterococcus faecalis.  E. coli is pansensitive but sensitivity report for E faecalis is still pending.  It is important to wait for sensitivity report given recurrent UTI. 1 out of 2 blood culture bottles showed gram-positive cocci.  This is probably a contaminant.  Susceptibility report is pending. ACTH stimulation test was normal and adrenal insufficiency has been ruled out.   Chronic atrial fibrillation with RVR (HCC) Atrial flutter: Continue metoprolol and Eliquis    Essential hypertension Continue metoprolol    Chronic heart failure with  preserved ejection fraction (HFpEF) (HCC) Resume Lasix and Aldactone    Type 2 diabetes mellitus (HCC), hypoglycemia Glucose dropped to 55 last night. Decrease insulin glargine from 10 units to 5 units daily.    Alcoholic liver cirrhosis, hepatocellular carcinoma (HCC) No acute issues suspected    Pancytopenia Probably due to liver disease.   Frailty, debility She is she is at her baseline.  She is able to ambulate to the bathroom.   At home, she ambulates without a walker but she uses a walker when she goes out.  She said going to SNF is not an option and she prefers to go back home.    Stage 3b chronic kidney disease (HCC) At baseline    Diet Order             Diet heart healthy/carb modified Room service appropriate? No; Fluid consistency: Thin  Diet effective now                            Consultants: None  Procedures: None    Medications:    allopurinol  100 mg Oral Daily   apixaban  5 mg Oral BID   escitalopram  10 mg Oral Daily   furosemide  20 mg Oral Daily   insulin aspart  0-15 Units Subcutaneous TID WC   insulin aspart  0-5 Units Subcutaneous QHS   insulin glargine-yfgn  5 Units Subcutaneous Q supper   metoprolol tartrate  12.5 mg Oral BID   mirtazapine  7.5 mg Oral QHS   spironolactone  25 mg Oral Daily  traZODone  50 mg Oral QHS   Continuous Infusions:  sodium chloride Stopped (11/19/22 0148)   ceFEPime (MAXIPIME) IV 2 g (11/20/22 1256)     Anti-infectives (From admission, onward)    Start     Dose/Rate Route Frequency Ordered Stop   11/18/22 1300  ceFEPIme (MAXIPIME) 2 g in sodium chloride 0.9 % 100 mL IVPB  Status:  Discontinued        2 g 200 mL/hr over 30 Minutes Intravenous Every 12 hours 11/18/22 0420 11/18/22 0422   11/18/22 1300  ceFEPIme (MAXIPIME) 2 g in sodium chloride 0.9 % 100 mL IVPB        2 g 200 mL/hr over 30 Minutes Intravenous Every 12 hours 11/18/22 0422 11/25/22 1259   11/18/22 0045  ceFEPIme  (MAXIPIME) 2 g in sodium chloride 0.9 % 100 mL IVPB        2 g 200 mL/hr over 30 Minutes Intravenous  Once 11/18/22 0036 11/18/22 0208              Family Communication/Anticipated D/C date and plan/Code Status   DVT prophylaxis:  apixaban (ELIQUIS) tablet 5 mg     Code Status: DNR  Family Communication: None Disposition Plan: Plan to discharge home tomorrow   Status is: Inpatient Remains inpatient appropriate because: Sepsis from UTI on IV antibiotics       Subjective:   No acute events overnight.  She has no complaints.  Objective:    Vitals:   11/19/22 2342 11/20/22 0335 11/20/22 0911 11/20/22 1222  BP: 138/70 (!) 149/84 126/70 (!) 145/70  Pulse: 65 61 79 66  Resp: 16 18 18 18   Temp: 98 F (36.7 C) 98.2 F (36.8 C) 98.2 F (36.8 C) 98.2 F (36.8 C)  TempSrc: Oral  Oral Oral  SpO2: 100% 98% 99% 100%  Weight:      Height:       No data found.   Intake/Output Summary (Last 24 hours) at 11/20/2022 1353 Last data filed at 11/20/2022 1041 Gross per 24 hour  Intake 544.45 ml  Output --  Net 544.45 ml   Filed Weights   11/17/22 2242  Weight: 78.5 kg    Exam:  GEN: NAD SKIN: Warm and dry EYES: No pallor or icterus ENT: MMM CV: RRR PULM: CTA B ABD: soft, ND, NT, +BS CNS: AAO x 3, non focal EXT: No edema or tenderness        Data Reviewed:   I have personally reviewed following labs and imaging studies:  Labs: Labs show the following:   Basic Metabolic Panel: Recent Labs  Lab 11/17/22 2307 11/19/22 0605  NA 135 135  K 3.7 3.5  CL 101 104  CO2 25 26  GLUCOSE 136* 124*  BUN 33* 19  CREATININE 1.47* 1.23*  CALCIUM 7.7* 7.7*  MG 2.1  --    GFR Estimated Creatinine Clearance: 45.7 mL/min (A) (by C-G formula based on SCr of 1.23 mg/dL (H)). Liver Function Tests: Recent Labs  Lab 11/17/22 2307  AST 44*  ALT 16  ALKPHOS 65  BILITOT 0.8  PROT 8.8*  ALBUMIN 2.9*   No results for input(s): "LIPASE", "AMYLASE" in the  last 168 hours. No results for input(s): "AMMONIA" in the last 168 hours. Coagulation profile Recent Labs  Lab 11/18/22 0430  INR 1.7*    CBC: Recent Labs  Lab 11/17/22 2307 11/19/22 0605 11/20/22 0429  WBC 3.3* 2.5* 2.1*  NEUTROABS 1.2* 1.2* 1.1*  HGB 11.4* 9.1* 9.2*  HCT 34.2* 26.7* 27.9*  MCV 94.5 93.4 95.5  PLT 129* 99* 91*   Cardiac Enzymes: No results for input(s): "CKTOTAL", "CKMB", "CKMBINDEX", "TROPONINI" in the last 168 hours. BNP (last 3 results) No results for input(s): "PROBNP" in the last 8760 hours. CBG: Recent Labs  Lab 11/19/22 1152 11/19/22 1546 11/19/22 12-22-2029 11/20/22 0842 11/20/22 1224  GLUCAP 195* 156* 121* 92 142*   D-Dimer: No results for input(s): "DDIMER" in the last 72 hours. Hgb A1c: Recent Labs    11/18/22 0430  HGBA1C 5.8*   Lipid Profile: No results for input(s): "CHOL", "HDL", "LDLCALC", "TRIG", "CHOLHDL", "LDLDIRECT" in the last 72 hours. Thyroid function studies: Recent Labs    11/17/22 12/22/2305  TSH 3.029   Anemia work up: No results for input(s): "VITAMINB12", "FOLATE", "FERRITIN", "TIBC", "IRON", "RETICCTPCT" in the last 72 hours. Sepsis Labs: Recent Labs  Lab 11/17/22 12-22-2305 11/17/22 2320 11/18/22 0254 11/18/22 0430 11/19/22 0605 11/20/22 0429  PROCALCITON  --   --   --  <0.10  --   --   WBC 3.3*  --   --   --  2.5* 2.1*  LATICACIDVEN  --  2.8* 2.8*  --  1.2  --     Microbiology Recent Results (from the past 240 hour(s))  Urine Culture (for pregnant, neutropenic or urologic patients or patients with an indwelling urinary catheter)     Status: Abnormal (Preliminary result)   Collection Time: 11/17/22 11:09 PM   Specimen: Urine, Clean Catch  Result Value Ref Range Status   Specimen Description   Final    URINE, CLEAN CATCH Performed at [facility], [address] [place], [place] [address]    Special Requests   Final    NONE Performed at [facility], [address], [place], [place]  [address]    Culture (A)  Final    80,000 COLONIES/mL ESCHERICHIA COLI 30,000 COLONIES/mL ENTEROCOCCUS FAECALIS SUSCEPTIBILITIES TO FOLLOW Performed at [facility], [address] [place], [place] [address]    Report Status PENDING  Incomplete   Organism ID, Bacteria ESCHERICHIA COLI (A)  Final      Susceptibility   Escherichia coli - MIC*    AMPICILLIN <=2 SENSITIVE Sensitive     CEFAZOLIN <=4 SENSITIVE Sensitive     CEFEPIME <=0.12 SENSITIVE Sensitive     CEFTRIAXONE <=0.25 SENSITIVE Sensitive     CIPROFLOXACIN <=0.25 SENSITIVE Sensitive     GENTAMICIN <=1 SENSITIVE Sensitive     IMIPENEM <=0.25 SENSITIVE Sensitive     NITROFURANTOIN <=16 SENSITIVE Sensitive     TRIMETH/SULFA <=20 SENSITIVE Sensitive     AMPICILLIN/SULBACTAM <=2 SENSITIVE Sensitive     PIP/TAZO <=4 SENSITIVE Sensitive     * 80,000 COLONIES/mL ESCHERICHIA COLI  Culture, blood (single)     Status: None (Preliminary result)   Collection Time: 11/18/22 12:56 AM   Specimen: BLOOD  Result Value Ref Range Status   Specimen Description   Final    BLOOD BLOOD RIGHT ARM Performed at [facility], [address], [place], [place] [address]    Special Requests   Final    BOTTLES DRAWN AEROBIC AND ANAEROBIC Blood Culture adequate volume Performed at [facility], [address] [place], [place] [address]    Culture  Setup Time   Final    GRAM POSITIVE COCCI AEROBIC BOTTLE ONLY CRITICAL RESULT CALLED TO, READ BACK BY AND VERIFIED WITH: JASON ROBBINS AT 4742 11/19/22.PMF    Culture   Final  GRAM POSITIVE COCCI IDENTIFICATION TO FOLLOW Performed at [facility], [address] [place], [place] [address]    Report Status PENDING  Incomplete  Blood Culture ID Panel (Reflexed)     Status: None   Collection Time: 11/18/22 12:56 AM  Result Value Ref Range Status   Enterococcus faecalis NOT DETECTED NOT DETECTED Final   Enterococcus Faecium NOT DETECTED NOT DETECTED Final   Listeria  monocytogenes NOT DETECTED NOT DETECTED Final   Staphylococcus species NOT DETECTED NOT DETECTED Final   Staphylococcus aureus (BCID) NOT DETECTED NOT DETECTED Final   Staphylococcus epidermidis NOT DETECTED NOT DETECTED Final   Staphylococcus lugdunensis NOT DETECTED NOT DETECTED Final   Streptococcus species NOT DETECTED NOT DETECTED Final   Streptococcus agalactiae NOT DETECTED NOT DETECTED Final   Streptococcus pneumoniae NOT DETECTED NOT DETECTED Final   Streptococcus pyogenes NOT DETECTED NOT DETECTED Final   A.calcoaceticus-baumannii NOT DETECTED NOT DETECTED Final   Bacteroides fragilis NOT DETECTED NOT DETECTED Final   Enterobacterales NOT DETECTED NOT DETECTED Final   Enterobacter cloacae complex NOT DETECTED NOT DETECTED Final   Escherichia coli NOT DETECTED NOT DETECTED Final   Klebsiella aerogenes NOT DETECTED NOT DETECTED Final   Klebsiella oxytoca NOT DETECTED NOT DETECTED Final   Klebsiella pneumoniae NOT DETECTED NOT DETECTED Final   Proteus species NOT DETECTED NOT DETECTED Final   Salmonella species NOT DETECTED NOT DETECTED Final   Serratia marcescens NOT DETECTED NOT DETECTED Final   Haemophilus influenzae NOT DETECTED NOT DETECTED Final   Neisseria meningitidis NOT DETECTED NOT DETECTED Final   Pseudomonas aeruginosa NOT DETECTED NOT DETECTED Final   Stenotrophomonas maltophilia NOT DETECTED NOT DETECTED Final   Candida albicans NOT DETECTED NOT DETECTED Final   Candida auris NOT DETECTED NOT DETECTED Final   Candida glabrata NOT DETECTED NOT DETECTED Final   Candida krusei NOT DETECTED NOT DETECTED Final   Candida parapsilosis NOT DETECTED NOT DETECTED Final   Candida tropicalis NOT DETECTED NOT DETECTED Final   Cryptococcus neoformans/gattii NOT DETECTED NOT DETECTED Final    Comment: Performed at [facility], [address] [place], [place] [address]    Procedures and diagnostic studies:  No results found.             LOS: 2 days       [facility]   Pager on [email or web address]. If 7PM-7AM, please contact night-coverage at [email or web address]     11/20/2022, 1:53 PM

## 2022-11-20 NOTE — TOC Initial Note (Addendum)
Transition of Care [facility]) - Initial/Assessment Note    Patient Details  Name: Adriana Miller MRN: [id number] Date of Birth: 25-Nov-1955  Transition of Care [facility]) CM/SW Contact:    Liliana Cline, LCSW Phone Number: 11/20/2022, 2:07 PM  Clinical Narrative:                 Met with patient at bedside for high risk assessment.  Patient lives alone. She uses [employer]. Her brother will drive her home when discharged. PCP is Lynn Ito. [facility]. Patient has a RW and cane at home. Patient states she has an aide through [facility]) who comes out daily, she states her [name] is [name] and # is [phone] if needed.  Patient states she wants HH and wants to use the agency she had in the past. She would like RN and PT if possible. Per [facility], patient had [facility] in the past. Katina with [facility] is checking if she can accept the patient back.  Patient also states she wants a wheelchair because it is difficult for her to walk long distances. Requested orders from MD and made referral to Selena Batten with [employer] for home delivery of wheelchair per patient's request.   2:50- Call from Kim with [employer] who states the wheelchair order is incorrect. Asked MD to update the order with narrative and accessories per Selena Batten.   3:05- Call from [employer] with [facility] who states her boss states they are not able to accept the patient back.  Called Cory with [facility] who can accept for PT only.    Expected Discharge Plan: Home w Home Health Services Barriers to Discharge: Continued Medical Work up   Patient Goals and CMS Choice Patient states their goals for this hospitalization and ongoing recovery are:: home with home health CMS Medicare.gov Compare Post Acute Care list provided to:: Patient Choice offered to / list presented to : Patient      Expected Discharge Plan and Services       Living arrangements for the past 2 months: Single Family Home                  DME Arranged: Walker rolling DME Agency: [facility] Date DME Agency Contacted: 11/20/22   Representative spoke with at DME Agency: Selena Batten HH Arranged: PT, RN HH Agency: [facility] Date [facility] Agency Contacted: 11/20/22   Representative spoke with at [facility] Agency: Laurelyn Sickle  Prior Living Arrangements/Services Living arrangements for the past 2 months: Single Family Home Lives with:: Self Patient language and need for interpreter reviewed:: Yes Do you feel safe going back to the place where you live?: Yes      Need for Family Participation in Patient Care: Yes (Comment) Care giver support system in place?: Yes (comment) Current home services: DME Criminal Activity/Legal Involvement Pertinent to Current Situation/Hospitalization: No - Comment as needed  Activities of Daily Living Home Assistive Devices/Equipment: Dan Humphreys (specify type) ADL Screening (condition at time of admission) Patient's cognitive ability adequate to safely complete daily activities?: Yes Is the patient deaf or have difficulty hearing?: No Does the patient have difficulty seeing, even when wearing glasses/contacts?: No Does the patient have difficulty concentrating, remembering, or making decisions?: No Patient able to express need for assistance with ADLs?: Yes Does the patient have difficulty dressing or bathing?: No Independently performs ADLs?: Yes (appropriate for developmental age) Does the patient have difficulty walking or climbing stairs?: No Weakness of Legs: None Weakness of Arms/Hands: None  Permission Sought/Granted Permission sought to share information with : Facility Medical sales representative, Family Supports Permission granted to share information with : Yes, Verbal Permission Granted     Permission granted to share info w AGENCY: HH, DME        Emotional Assessment       Orientation: : Oriented to Situation, Oriented to Self, Oriented to Place, Oriented to  Time Alcohol / Substance  Use: Not Applicable Psych Involvement: No (comment)  Admission diagnosis:  Atrial fibrillation with rapid ventricular response (HCC) [I48.91] Sepsis (HCC) [A41.9] Nausea and vomiting, unspecified vomiting type [R11.2] Patient Active Problem List   Diagnosis Date Noted   Chronic atrial fibrillation with RVR (HCC) 11/18/2022   SIRS (systemic inflammatory response syndrome) (HCC) 11/18/2022   Stage 3b chronic kidney disease (HCC) 11/18/2022   Frailty 11/18/2022   Sepsis (HCC) 11/18/2022   Hypoglycemia 10/02/2022   Hyponatremia 10/01/2022   Hypokalemia 10/01/2022   Chronic kidney disease, stage 3a (HCC) 10/01/2022   Urinary tract infection 09/30/2022   Type II diabetes mellitus with renal manifestations (HCC) 09/30/2022   Depression 09/30/2022   Diarrhea 09/30/2022   Hypomagnesemia 09/30/2022   Severe sepsis (HCC) 08/22/2022   AKI (acute kidney injury) (HCC) 08/21/2022   Elevated LFTs 08/21/2022   Thrombocytopenia (HCC) 08/21/2022   History of cerebral aneurysm repair 08/21/2022   Metabolic encephalopathy 08/21/2022   Elevated troponin 08/21/2022   Dehydration 06/18/2022   SIRS due to infectious process with acute organ dysfunction (HCC) 06/17/2022   Bacterial conjunctivitis 06/17/2022   Lactic acidosis 06/17/2022   Paroxysmal atrial fibrillation (HCC) 08/06/2021   Shaking 08/06/2021   Hepatic cirrhosis (HCC) 04/30/2020   Hepatocellular carcinoma (HCC) 01/08/2020   Left arm weakness 07/25/2019   Atrial fibrillation with RVR (HCC) 07/25/2019   Hypotension 04/20/2019   Alcoholic cirrhosis of liver without ascites (HCC)    Chronic heart failure with preserved ejection fraction (HFpEF) (HCC) 01/15/2019   Dyspnea on exertion 10/10/2018   Persistent atrial fibrillation (HCC) 10/10/2018   Diastolic dysfunction 10/10/2018   Acute upper respiratory infection 09/07/2017   Essential hypertension 09/07/2017   Type 2 diabetes mellitus (HCC) 04/05/2017   Weakness 04/05/2017   Mixed  hyperlipidemia 04/05/2017   Alcohol abuse with other alcohol-induced disorder (HCC) 04/05/2017   B12 deficiency 04/05/2017   History of seizure 05/20/2015   Brain aneurysm 05/20/2015   PCP:  Carlean Jews, PA-C [phone] [place] [place] - [address] [place] [address] Phone: [phone] Fax: [phone]  [facility]) - [place], [place] - [address] [facility] [address] [place] [address] Phone: [phone] Fax: [phone]  [facility] - [place], [place] - [address] [place]  [address] Phone: [phone] Fax: [phone]     Social Determinants of Health (SDOH) Social History: SDOH Screenings   Food Insecurity: No Food Insecurity (11/18/2022)  Housing: Low Risk  (11/18/2022)  Transportation Needs: No Transportation Needs (11/18/2022)  Utilities: Not At Risk (11/18/2022)  Alcohol Screen: Low Risk  (05/13/2022)  Depression (PHQ2-9): Low Risk  (05/13/2022)  Tobacco Use: High Risk (11/18/2022)   SDOH Interventions:     Readmission Risk Interventions    11/20/2022    2:04 PM 10/01/2022    9:15 AM  Readmission Risk Prevention Plan  Transportation Screening Complete Complete  Medication Review (RN Care Manager) Complete Complete  PCP or Specialist appointment within 3-5  days of discharge Complete Complete  HRI or Home Care Consult Complete Complete  SW Recovery Care/Counseling Consult Complete Complete  Palliative Care Screening Not Applicable Not Applicable  Skilled Nursing Facility Not Applicable Not Applicable

## 2022-11-20 NOTE — Plan of Care (Signed)
  Problem: Fluid Volume: Goal: Hemodynamic stability will improve Outcome: Progressing   Problem: Clinical Measurements: Goal: Signs and symptoms of infection will decrease Outcome: Progressing   Problem: Clinical Measurements: Goal: Ability to maintain clinical measurements within normal limits will improve Outcome: Progressing   Problem: Skin Integrity: Goal: Risk for impaired skin integrity will decrease Outcome: Progressing   Problem: Pain Managment: Goal: General experience of comfort will improve Outcome: Progressing   Problem: Elimination: Goal: Will not experience complications related to urinary retention Outcome: Progressing   Problem: Elimination: Goal: Will not experience complications related to bowel motility Outcome: Progressing   Problem: Activity: Goal: Risk for activity intolerance will decrease Outcome: Progressing

## 2022-11-20 NOTE — Plan of Care (Signed)
  Problem: Fluid Volume: Goal: Hemodynamic stability will improve Outcome: Progressing   Problem: Clinical Measurements: Goal: Diagnostic test results will improve Outcome: Progressing Goal: Signs and symptoms of infection will decrease Outcome: Progressing   Problem: Education: Goal: Ability to describe self-care measures that may prevent or decrease complications (Diabetes Survival Skills Education) will improve Outcome: Progressing

## 2022-11-21 DIAGNOSIS — A4151 Sepsis due to Escherichia coli [E. coli]: Secondary | ICD-10-CM | POA: Diagnosis not present

## 2022-11-21 LAB — URINE CULTURE: Culture: 80000 — AB

## 2022-11-21 LAB — GLUCOSE, CAPILLARY: Glucose-Capillary: 92 mg/dL (ref 70–99)

## 2022-11-21 MED ORDER — AMOXICILLIN 500 MG PO CAPS: 500.0000 mg | ORAL_CAPSULE | Freq: Three times a day (TID) | ORAL | 0 refills | Status: AC

## 2022-11-21 NOTE — Discharge Summary (Signed)
Physician Discharge Summary   Patient: Adriana Miller MRN: [id number] DOB: [birth year]  Admit date:     11/17/2022  Discharge date: 11/21/2022  Discharge Physician: Lurene Shadow   PCP: Carlean Jews, PA-C   Recommendations at discharge:   Follow-up with PCP in 1 week  Discharge Diagnoses: Principal Problem:   Sepsis (HCC) Active Problems:   Urinary tract infection   SIRS (systemic inflammatory response syndrome) (HCC)   Chronic atrial fibrillation with RVR (HCC)   Essential hypertension   Type 2 diabetes mellitus (HCC)   Chronic heart failure with preserved ejection fraction (HFpEF) (HCC)   Hepatocellular carcinoma (HCC)   Stage 3b chronic kidney disease (HCC)   Frailty  Resolved Problems:   * No resolved hospital problems. *  Hospital Course:  [name] is a 67 y.o. female with medical history significant for hypertension,diabetes mellitus, diastolic CHF, A-fib on Eliquis, depression, CKD-3A, alcoholic cirrhosis, thrombocytopenia, hepatocellular carcinoma, hospitalized from 6/12 to 6/15 with severe sepsis secondary to E. coli UTI.  She presented to the hospital with 2-day history of dysuria, general weakness, dizziness and vomiting.  Symptoms similar to symptoms she experienced when she had sepsis from UTI recently.     She was monitored to the hospital for severe sepsis secondary to acute UTI.     Assessment and Plan:  Severe sepsis secondary to acute UTI, recurrent UTI  Lactic acid was 2.8 on admission.  Urine culture showed E. coli and Enterococcus faecalis.  She will be discharged on amoxicillin for 4 days to complete 7 days of treatment. 1 out of 2 blood culture bottles showed Alloiococcus otitis which is probably a contaminant.  ACTH stimulation test was normal and adrenal insufficiency has been ruled out.     Chronic atrial fibrillation with RVR (HCC) Atrial flutter: Continue metoprolol and Eliquis     Essential hypertension Continue  metoprolol     Chronic heart failure with preserved ejection fraction (HFpEF) (HCC) Continue Lasix and Aldactone.     Type 2 diabetes mellitus (HCC), hypoglycemia Continue Toujeo at discharge.     Alcoholic liver cirrhosis, hepatocellular carcinoma (HCC) No acute issues suspected     Pancytopenia Probably due to liver disease.     Frailty, debility She is she is at her baseline.  She is able to ambulate to the bathroom.   At home, she ambulates without a walker but she uses a walker when she goes out.  She said going to SNF is not an option and she prefers to go back home.     Stage 3b chronic kidney disease (HCC) At baseline        Consultants: None Procedures performed: None  Disposition: Home health Diet recommendation:  Discharge Diet Orders (From admission, onward)     Start     Ordered   11/21/22 0000  Diet - low sodium heart healthy        11/21/22 0930   11/21/22 0000  Diet Carb Modified        11/21/22 0930           Cardiac and Carb modified diet DISCHARGE MEDICATION: Allergies as of 11/21/2022       Reactions   Aspirin Other (See Comments)   Nose bleed        Medication List     TAKE these medications    Accu-Chek Guide Me w/Device Kit Use as directed  dx E11.65   Accu-Chek Guide test strip Generic drug: glucose blood Use as instructed  twice a day DX E11.65   acetaminophen 325 MG tablet Commonly known as: TYLENOL Take 2 tablets (650 mg total) by mouth every 6 (six) hours as needed for mild pain (or Fever >/= 101).   allopurinol 100 MG tablet Commonly known as: ZYLOPRIM TAKE 1 TABLET BY MOUTH DAILY   amoxicillin 500 MG capsule Commonly known as: AMOXIL Take 1 capsule (500 mg total) by mouth 3 (three) times daily for 4 days.   B-D SINGLE USE SWABS REGULAR Pads USE AS DIRECTED TWICE DAILY   BD Pen Needle Nano 2nd Gen 32G X 4 MM Misc Generic drug: Insulin Pen Needle USE AS DIRECTED WITH TOUJEO   Calcium  Carb-Cholecalciferol 500-5 MG-MCG Tabs Commonly known as: Calcium 500/D Take 1 tab po daily   Dexcom G7 Receiver Devi Use for continuous blood glucose monitoring  DX E11.65   Dexcom G7 Sensor Misc USE AS DIRECTED FOR CONTINUOUS  GLUCOSE MONITORING. CHANGE  SENSOR EVERY 10 DAYS   Eliquis 5 MG Tabs tablet Generic drug: apixaban TAKE 1 TABLET BY MOUTH TWICE  DAILY   ergocalciferol 1.25 MG (50000 UT) capsule Commonly known as: VITAMIN D2 Take 1 capsule (50,000 Units total) by mouth once a week.   escitalopram 10 MG tablet Commonly known as: LEXAPRO TAKE 1 TABLET BY MOUTH DAILY FOR ANXIETY/SLEEP What changed: See the new instructions.   folic acid 1 MG tablet Commonly known as: FOLVITE Take 1 tablet (1 mg total) by mouth daily.   furosemide 20 MG tablet Commonly known as: LASIX TAKE 1 TABLET (20 MG TOTAL) BY MOUTH DAILY. What changed:  how much to take how to take this when to take this   metoprolol tartrate 25 MG tablet Commonly known as: LOPRESSOR TAKE ONE-HALF TABLET BY MOUTH  TWICE DAILY   mirtazapine 7.5 MG tablet Commonly known as: REMERON TAKE 1 TABLET BY MOUTH DAILY AT  BEDTIME What changed:  how much to take how to take this when to take this   potassium chloride 8 MEQ tablet Commonly known as: KLOR-CON TAKE 1 TABLET BY MOUTH 3 TIMES  WEEKLY ON MONDAY, WEDNESDAY, AND FRIDAY   Safety Lancets 28G Misc Use as directed once a daily Dx e11.65   spironolactone 25 MG tablet Commonly known as: ALDACTONE TAKE 1 TABLET BY MOUTH DAILY FOR CIRRHOSIS OF LIVER What changed: See the new instructions.   thiamine 100 MG tablet Commonly known as: Vitamin B-1 Take 1 tablet (100 mg total) by mouth daily.   Toujeo SoloStar 300 UNIT/ML Solostar Pen Generic drug: insulin glargine (1 Unit Dial) Inject 10 Units into the skin daily with supper.   traZODone 50 MG tablet Commonly known as: DESYREL TAKE 1 TO 2 TABLETS BY MOUTH AT  BEDTIME FOR INSOMNIA                Durable Medical Equipment  (From admission, onward)           Start     Ordered   11/20/22 0000  For home use only DME lightweight manual wheelchair with seat cushion       Comments: Patient suffers from liver cirrhosis, general weakness which impairs their ability to perform daily activities like bathing and toileting in the home.  A walker will not resolve  issue with performing activities of daily living. A wheelchair will allow patient to safely perform daily activities. Patient is not able to propel themselves in the home using a standard weight wheelchair due to general weakness. Patient can self propel  in the lightweight wheelchair. Length of need Lifetime. Accessories: elevating leg rests (ELRs), wheel locks, extensions and anti-tippers.   11/20/22 1629            Discharge Exam: Filed Weights   11/17/22 2242 11/20/22 2159  Weight: 78.5 kg 82.3 kg   GEN: NAD SKIN: Warm and dry EYES: No pallor or icterus ENT: MMM CV: RRR PULM: CTA B ABD: soft, ND, NT, +BS CNS: AAO x 3, non focal EXT: No edema or tenderness   Condition at discharge: good  The results of significant diagnostics from this hospitalization (including imaging, microbiology, ancillary and laboratory) are listed below for reference.   Imaging Studies: CT ABDOMEN PELVIS W CONTRAST  Result Date: 11/18/2022 CLINICAL DATA:  Tachycardia and dizziness. EXAM: CT ABDOMEN AND PELVIS WITH CONTRAST TECHNIQUE: Multidetector CT imaging of the abdomen and pelvis was performed using the standard protocol following bolus administration of intravenous contrast. RADIATION DOSE REDUCTION: This exam was performed according to the departmental dose-optimization program which includes automated exposure control, adjustment of the mA and/or kV according to patient size and/or use of iterative reconstruction technique. CONTRAST:  75mL OMNIPAQUE IOHEXOL 300 MG/ML  SOLN COMPARISON:  June 17, 2022 FINDINGS: Lower chest: No acute  abnormality. Hepatobiliary: There is diffuse fatty infiltration of the liver parenchyma. No focal liver abnormality is seen. Numerous tiny gallstones are seen within the gallbladder lumen, without evidence of gallbladder wall thickening, pericholecystic inflammatory fat stranding or biliary dilatation. Pancreas: Unremarkable. No pancreatic ductal dilatation or surrounding inflammatory changes. Spleen: Normal in size without focal abnormality. Adrenals/Urinary Tract: Adrenal glands are unremarkable. Kidneys are normal in size, without obstructing renal calculi or hydronephrosis. A 4 mm parenchymal calcification is seen within the mid right kidney. A subcentimeter simple cyst is also noted within the anterior aspect of the mid to upper left kidney. Bladder is unremarkable. Stomach/Bowel: There is a small hiatal hernia. Appendix appears normal. No evidence of bowel wall thickening, distention, or inflammatory changes. Vascular/Lymphatic: Marked severity calcification of the mesenteric vasculature is seen. No enlarged abdominal or pelvic lymph nodes. Reproductive: Ill-defined 1.5 cm x 1.0 cm uterine fibroid is suspected within the anterolateral aspect of the uterine fundus on the right (axial CT image 71, CT series 2). The bilateral adnexa are unremarkable. Other: No abdominal wall hernia or abnormality. No abdominopelvic ascites. Musculoskeletal: Multilevel degenerative changes seen throughout the lumbar spine. IMPRESSION: 1. Cholelithiasis without evidence of acute cholecystitis. 2. Hepatic steatosis. 3. Small hiatal hernia. 4. Small uterine fibroid. Electronically Signed   By: Aram Candela M.D.   On: 11/18/2022 01:16   DG Chest 2 View  Result Date: 11/17/2022 CLINICAL DATA:  Dizziness with tachycardia. EXAM: CHEST - 2 VIEW COMPARISON:  September 29, 2022 FINDINGS: The heart size and mediastinal contours are within normal limits. There is mild calcification of the aortic arch. Both lungs are clear. Multilevel  degenerative changes seen throughout the thoracic spine. IMPRESSION: No active cardiopulmonary disease. Electronically Signed   By: Aram Candela M.D.   On: 11/17/2022 23:56   CT HEAD WO CONTRAST ( )  Result Date: 11/17/2022 CLINICAL DATA:  Mental status change EXAM: CT HEAD WITHOUT CONTRAST TECHNIQUE: Contiguous axial images were obtained from the base of the skull through the vertex without intravenous contrast. RADIATION DOSE REDUCTION: This exam was performed according to the departmental dose-optimization program which includes automated exposure control, adjustment of the mA and/or kV according to patient size and/or use of iterative reconstruction technique. COMPARISON:  CT brain 09/22/2022 FINDINGS: Brain: No  acute territorial infarction, hemorrhage or intracranial mass. Moderate atrophy. Mild chronic small vessel ischemic changes of the white matter. Stable ventricle size. Vascular: No hyperdense vessels. Vertebral and carotid vascular calcification Skull: No fracture.  Right frontal parietal craniotomy Sinuses/Orbits: Old right medial orbital wall fracture. No acute findings Other: None IMPRESSION: 1. No CT evidence for acute intracranial abnormality. 2. Atrophy and chronic small vessel ischemic changes of the white matter. 3. Previous right craniotomy Electronically Signed   By: Jasmine Pang M.D.   On: 11/17/2022 23:56    Microbiology: Results for orders placed or performed during the hospital encounter of 11/17/22  Urine Culture (for pregnant, neutropenic or urologic patients or patients with an indwelling urinary catheter)     Status: Abnormal   Collection Time: 11/17/22 11:09 PM   Specimen: Urine, Clean Catch  Result Value Ref Range Status   Specimen Description   Final    URINE, CLEAN CATCH Performed at [facility], [address] [place], [place] [address]    Special Requests   Final    NONE Performed at [facility], [address], [place], [place]  [address]    Culture (A)  Final    80,000 COLONIES/mL ESCHERICHIA COLI 30,000 COLONIES/mL ENTEROCOCCUS FAECALIS    Report Status 11/21/2022 FINAL  Final   Organism ID, Bacteria ESCHERICHIA COLI (A)  Final   Organism ID, Bacteria ENTEROCOCCUS FAECALIS (A)  Final      Susceptibility   Escherichia coli - MIC*    AMPICILLIN <=2 SENSITIVE Sensitive     CEFAZOLIN <=4 SENSITIVE Sensitive     CEFEPIME <=0.12 SENSITIVE Sensitive     CEFTRIAXONE <=0.25 SENSITIVE Sensitive     CIPROFLOXACIN <=0.25 SENSITIVE Sensitive     GENTAMICIN <=1 SENSITIVE Sensitive     IMIPENEM <=0.25 SENSITIVE Sensitive     NITROFURANTOIN <=16 SENSITIVE Sensitive     TRIMETH/SULFA <=20 SENSITIVE Sensitive     AMPICILLIN/SULBACTAM <=2 SENSITIVE Sensitive     PIP/TAZO <=4 SENSITIVE Sensitive     * 80,000 COLONIES/mL ESCHERICHIA COLI   Enterococcus faecalis - MIC*    AMPICILLIN <=2 SENSITIVE Sensitive     NITROFURANTOIN <=16 SENSITIVE Sensitive     VANCOMYCIN 1 SENSITIVE Sensitive     * 30,000 COLONIES/mL ENTEROCOCCUS FAECALIS  Culture, blood (single)     Status: Abnormal (Preliminary result)   Collection Time: 11/18/22 12:56 AM   Specimen: BLOOD  Result Value Ref Range Status   Specimen Description   Final    BLOOD BLOOD RIGHT ARM Performed at [facility], [address], [place], [place] [address]    Special Requests   Final    BOTTLES DRAWN AEROBIC AND ANAEROBIC Blood Culture adequate volume Performed at [facility], [address] [place], [place] [address]    Culture  Setup Time   Final    GRAM POSITIVE COCCI AEROBIC BOTTLE ONLY CRITICAL RESULT CALLED TO, READ BACK BY AND VERIFIED WITH: JASON ROBBINS AT 7425 11/19/22.PMF    Culture (A)  Final    ALLOIOCOCCUS OTITIS SUSCEPTIBILITIES TO FOLLOW Performed at [facility], [address], [place], [place] [address]    Report Status PENDING  Incomplete  Blood Culture ID Panel (Reflexed)     Status: None   Collection Time: 11/18/22  12:56 AM  Result Value Ref Range Status   Enterococcus faecalis NOT DETECTED NOT DETECTED Final   Enterococcus Faecium NOT DETECTED NOT DETECTED Final   Listeria monocytogenes NOT DETECTED NOT DETECTED Final  Staphylococcus species NOT DETECTED NOT DETECTED Final   Staphylococcus aureus (BCID) NOT DETECTED NOT DETECTED Final   Staphylococcus epidermidis NOT DETECTED NOT DETECTED Final   Staphylococcus lugdunensis NOT DETECTED NOT DETECTED Final   Streptococcus species NOT DETECTED NOT DETECTED Final   Streptococcus agalactiae NOT DETECTED NOT DETECTED Final   Streptococcus pneumoniae NOT DETECTED NOT DETECTED Final   Streptococcus pyogenes NOT DETECTED NOT DETECTED Final   A.calcoaceticus-baumannii NOT DETECTED NOT DETECTED Final   Bacteroides fragilis NOT DETECTED NOT DETECTED Final   Enterobacterales NOT DETECTED NOT DETECTED Final   Enterobacter cloacae complex NOT DETECTED NOT DETECTED Final   Escherichia coli NOT DETECTED NOT DETECTED Final   Klebsiella aerogenes NOT DETECTED NOT DETECTED Final   Klebsiella oxytoca NOT DETECTED NOT DETECTED Final   Klebsiella pneumoniae NOT DETECTED NOT DETECTED Final   Proteus species NOT DETECTED NOT DETECTED Final   Salmonella species NOT DETECTED NOT DETECTED Final   Serratia marcescens NOT DETECTED NOT DETECTED Final   Haemophilus influenzae NOT DETECTED NOT DETECTED Final   Neisseria meningitidis NOT DETECTED NOT DETECTED Final   Pseudomonas aeruginosa NOT DETECTED NOT DETECTED Final   Stenotrophomonas maltophilia NOT DETECTED NOT DETECTED Final   Candida albicans NOT DETECTED NOT DETECTED Final   Candida auris NOT DETECTED NOT DETECTED Final   Candida glabrata NOT DETECTED NOT DETECTED Final   Candida krusei NOT DETECTED NOT DETECTED Final   Candida parapsilosis NOT DETECTED NOT DETECTED Final   Candida tropicalis NOT DETECTED NOT DETECTED Final   Cryptococcus neoformans/gattii NOT DETECTED NOT DETECTED Final    Comment: Performed at  [facility], [address] [place], [place] [address]    Labs: CBC: Recent Labs  Lab 11/17/22 2307 11/19/22 0605 11/20/22 0429  WBC 3.3* 2.5* 2.1*  NEUTROABS 1.2* 1.2* 1.1*  HGB 11.4* 9.1* 9.2*  HCT 34.2* 26.7* 27.9*  MCV 94.5 93.4 95.5  PLT 129* 99* 91*   Basic Metabolic Panel: Recent Labs  Lab 11/17/22 2307 11/19/22 0605  NA 135 135  K 3.7 3.5  CL 101 104  CO2 25 26  GLUCOSE 136* 124*  BUN 33* 19  CREATININE 1.47* 1.23*  CALCIUM 7.7* 7.7*  MG 2.1  --    Liver Function Tests: Recent Labs  Lab 11/17/22 2307  AST 44*  ALT 16  ALKPHOS 65  BILITOT 0.8  PROT 8.8*  ALBUMIN 2.9*   CBG: Recent Labs  Lab 11/20/22 0842 11/20/22 1224 11/20/22 1633 11/20/22 2129 11/21/22 0749  GLUCAP 92 142* 125* 115* 92    Discharge time spent: greater than 30 minutes.  Signed: Lurene Shadow, MD [facility] 11/21/2022

## 2022-11-21 NOTE — Plan of Care (Signed)
  Problem: Nutritional: Goal: Maintenance of adequate nutrition will improve Outcome: Progressing   Problem: Skin Integrity: Goal: Risk for impaired skin integrity will decrease Outcome: Progressing

## 2022-11-22 ENCOUNTER — Telehealth: Payer: Self-pay

## 2022-11-22 DIAGNOSIS — N179 Acute kidney failure, unspecified: Secondary | ICD-10-CM

## 2022-11-22 DIAGNOSIS — I4891 Unspecified atrial fibrillation: Secondary | ICD-10-CM

## 2022-11-23 ENCOUNTER — Telehealth: Payer: Self-pay | Admitting: *Deleted

## 2022-11-23 NOTE — Progress Notes (Signed)
  Care Coordination   Note   11/23/2022 Name: Adriana Miller MRN: [id number] DOB: [birth year]  Adriana Miller is a 67 y.o. year old female who sees McDonough, Salomon Fick, PA-C for primary care. I reached out to Adriana Miller by phone today to offer care coordination services.  [name] was given information about Care Coordination services today including:   The Care Coordination services include support from the care team which includes your Nurse Coordinator, Clinical Social Worker, or Pharmacist.  The Care Coordination team is here to help remove barriers to the health concerns and goals most important to you. Care Coordination services are voluntary, and the patient may decline or stop services at any time by request to their care team member.   Care Coordination Consent Status: Patient agreed to services and verbal consent obtained.   Follow up plan:  Telephone appointment with care coordination team member scheduled for:  11/29/2022  Encounter Outcome:  Pt. Scheduled from referral   Burman Nieves, [facility] Care Coordination Care Guide Direct Dial: [phone]

## 2022-11-25 ENCOUNTER — Other Ambulatory Visit: Payer: Self-pay | Admitting: Physician Assistant

## 2022-11-25 DIAGNOSIS — E1165 Type 2 diabetes mellitus with hyperglycemia: Secondary | ICD-10-CM

## 2022-11-26 NOTE — Telephone Encounter (Signed)
Please review hospital change direction

## 2022-11-29 ENCOUNTER — Ambulatory Visit: Payer: Self-pay

## 2022-11-29 NOTE — Patient Outreach (Signed)
  Care Coordination   11/29/2022 Name: Adriana Miller MRN: [id number] DOB: February 23, 1956   Care Coordination Outreach Attempts:  An unsuccessful telephone outreach was attempted for a scheduled appointment today.  HIPAA compliant message left with return call phone number.   Follow Up Plan:  Additional outreach attempts will be made to offer the patient care coordination information and services.   Encounter Outcome:  No Answer   Care Coordination Interventions:  No, not indicated    George Ina [facility] Care Coordination [phone] direct line

## 2022-12-02 ENCOUNTER — Inpatient Hospital Stay: Payer: 59 | Admitting: Physician Assistant

## 2022-12-03 ENCOUNTER — Other Ambulatory Visit: Payer: Self-pay | Admitting: Physician Assistant

## 2022-12-08 ENCOUNTER — Emergency Department
Admission: EM | Admit: 2022-12-08 | Discharge: 2022-12-09 | Disposition: A | Discharge status: Home / Self Care | Payer: 59 | Attending: Emergency Medicine | Admitting: Emergency Medicine

## 2022-12-08 ENCOUNTER — Emergency Department: Payer: 59

## 2022-12-08 ENCOUNTER — Other Ambulatory Visit: Payer: Self-pay

## 2022-12-08 DIAGNOSIS — R300 Dysuria: Secondary | ICD-10-CM | POA: Diagnosis not present

## 2022-12-08 DIAGNOSIS — R531 Weakness: Secondary | ICD-10-CM | POA: Insufficient documentation

## 2022-12-08 DIAGNOSIS — I509 Heart failure, unspecified: Secondary | ICD-10-CM | POA: Insufficient documentation

## 2022-12-08 DIAGNOSIS — E119 Type 2 diabetes mellitus without complications: Secondary | ICD-10-CM | POA: Diagnosis not present

## 2022-12-08 DIAGNOSIS — I4891 Unspecified atrial fibrillation: Secondary | ICD-10-CM | POA: Diagnosis not present

## 2022-12-08 DIAGNOSIS — F10920 Alcohol use, unspecified with intoxication, uncomplicated: Secondary | ICD-10-CM | POA: Insufficient documentation

## 2022-12-08 DIAGNOSIS — I110 Hypertensive heart disease with heart failure: Secondary | ICD-10-CM | POA: Diagnosis not present

## 2022-12-08 DIAGNOSIS — Z7901 Long term (current) use of anticoagulants: Secondary | ICD-10-CM | POA: Diagnosis not present

## 2022-12-08 DIAGNOSIS — R000 Tachycardia, unspecified: Secondary | ICD-10-CM | POA: Diagnosis not present

## 2022-12-08 DIAGNOSIS — Y908 Blood alcohol level of 240 mg/100 ml or more: Secondary | ICD-10-CM | POA: Diagnosis not present

## 2022-12-08 LAB — BASIC METABOLIC PANEL
Anion gap: 9 (ref 5–15)
BUN: 32 mg/dL — ABNORMAL HIGH (ref 8–23)
CO2: 27 mmol/L (ref 22–32)
Calcium: 7.9 mg/dL — ABNORMAL LOW (ref 8.9–10.3)
Chloride: 99 mmol/L (ref 98–111)
Creatinine, Ser: 2.14 mg/dL — ABNORMAL HIGH (ref 0.44–1.00)
GFR, Estimated: 24 mL/min — ABNORMAL LOW (ref >60)
Glucose, Bld: 179 mg/dL — ABNORMAL HIGH (ref 70–99)
Potassium: 3.9 mmol/L (ref 3.5–5.1)
Sodium: 135 mmol/L (ref 135–145)

## 2022-12-08 LAB — CBC
HCT: 35.2 % — ABNORMAL LOW (ref 36.0–46.0)
Hemoglobin: 11.2 g/dL — ABNORMAL LOW (ref 12.0–15.0)
MCH: 30.5 pg (ref 26.0–34.0)
MCHC: 31.8 g/dL (ref 30.0–36.0)
MCV: 95.9 fL (ref 80.0–100.0)
Platelets: 155 10*3/uL (ref 150–400)
RBC: 3.67 MIL/uL — ABNORMAL LOW (ref 3.87–5.11)
RDW: 14.6 % (ref 11.5–15.5)
WBC: 3.5 10*3/uL — ABNORMAL LOW (ref 4.0–10.5)
nRBC: 0 % (ref 0.0–0.2)

## 2022-12-08 MED ORDER — SODIUM CHLORIDE 0.9 % IV BOLUS
1000.0000 mL | Freq: Once | INTRAVENOUS | Status: AC
  Administered 2022-12-09: 1000 mL via INTRAVENOUS

## 2022-12-08 NOTE — ED Provider Notes (Signed)
[facility] Provider Note    Event Date/Time   First MD Initiated Contact with Patient 12/08/22 2258     (approximate)   History   No chief complaint on file.   HPI  Adriana Miller is a 67 y.o. female  with history of HTN, DM, CHF, A-fib on Eliquis, HCC presenting to the emergency department for evaluation of weakness.  Patient was at home earlier today when she had onset of generalized weakness.  Denies numbness, tingling, or focal weakness.  Denies trauma.  Does report she has had some dysuria over the past few days.  Denies chest pain or shortness of breath.  I did review her discharge summary from 11/21/2022.  At that time, she presented with generalized weakness and was found to have severe sepsis secondary to acute UTI with elevated lactic acid on presentation.  Also admitted from 6/12-6/16 for severe sepsis secondary to UTI.     Physical Exam   Triage Vital Signs: ED Triage Vitals  Encounter Vitals Group     BP 12/08/22 2221 119/71     Systolic BP Percentile --      Diastolic BP Percentile --      Pulse Rate 12/08/22 2221 (!) 120     Resp 12/08/22 2221 16     Temp 12/08/22 2221 98.7 F (37.1 C)     Temp src --      SpO2 12/08/22 2221 99 %     Weight 12/08/22 2229 180 lb (81.6 kg)     Height 12/08/22 2229 5\' 6"  (1.676 m)     Head Circumference --      Peak Flow --      Pain Score 12/08/22 2220 0     Pain Loc --      Pain Education --      Exclude from Growth Chart --     Most recent vital signs: Vitals:   12/09/22 0359 12/09/22 0400  BP:  128/76  Pulse: (!) 102   Resp: 14 13  Temp:    SpO2: 100%      General: Awake, interactive  CV:  Tachycardic with a regular rhythm, heart rates ranging from the 90s to 110s during my evaluation, normal peripheral perfusion Resp:  Lungs clear, unlabored respirations.  Abd:  Soft, nondistended.  Neuro:  Keenly aware, correctly answers month and age, able to blink eyes and squeeze hands, normal  horizontal extraocular movements, no visual field loss, normal facial symmetry, no arm or leg motor drift, no limb ataxia, normal sensation, no aphasia, speech mildly dysarthric, possibly patient's baseline    ED Results / Procedures / Treatments   Labs (all labs ordered are listed, but only abnormal results are displayed) Labs Reviewed  BASIC METABOLIC PANEL - Abnormal; Notable for the following components:      Result Value   Glucose, Bld 179 (*)    BUN 32 (*)    Creatinine, Ser 2.14 (*)    Calcium 7.9 (*)    GFR, Estimated 24 (*)    All other components within normal limits  CBC - Abnormal; Notable for the following components:   WBC 3.5 (*)    RBC 3.67 (*)    Hemoglobin 11.2 (*)    HCT 35.2 (*)    All other components within normal limits  URINALYSIS, ROUTINE W REFLEX MICROSCOPIC - Abnormal; Notable for the following components:   Color, Urine YELLOW (*)    APPearance CLEAR (*)    Hgb  urine dipstick SMALL (*)    All other components within normal limits  LACTIC ACID, PLASMA - Abnormal; Notable for the following components:   Lactic Acid, Venous 2.1 (*)    All other components within normal limits  HEPATIC FUNCTION PANEL - Abnormal; Notable for the following components:   Total Protein 8.4 (*)    Albumin 2.5 (*)    AST 77 (*)    Bilirubin, Direct 0.3 (*)    All other components within normal limits  ETHANOL - Abnormal; Notable for the following components:   Alcohol, Ethyl (B) 330 (*)    All other components within normal limits  CULTURE, BLOOD (ROUTINE X 2)  CULTURE, BLOOD (ROUTINE X 2)  LACTIC ACID, PLASMA     EKG EKG independently reviewed interpreted by myself (ER attending) demonstrates:  EKG demonstrates atrial flutter with variable AV block at a heart rate of 110, QRS 74, QTc 481, no acute ST changes  RADIOLOGY Imaging independently reviewed and interpreted by myself demonstrates:  CXR without obvious focal consolidation on my  review  PROCEDURES:  Critical Care performed: No  Procedures   MEDICATIONS ORDERED IN ED: Medications  sodium chloride 0.9 % bolus 1,000 mL (0 mLs Intravenous Stopped 12/09/22 0327)     IMPRESSION / MDM / ASSESSMENT AND PLAN / ED COURSE  I reviewed the triage vital signs and the nursing notes.  Differential diagnosis includes, but is not limited to, weakness due to anemia, arrhythmia, electrolyte abnormality, UTI, dehydration, drug intoxication or withdrawal  Patient's presentation is most consistent with acute presentation with potential threat to life or bodily function.  67 year old female presenting with generalized weakness, multiple recent admissions for sepsis secondary to UTI.  Basic labs sent from triage.Notable for persistent leukopenia, stable anemia.  BMP notable for AKI with a creatinine of 2.14, baseline around 1.3.  Documented history of CHF, but EF 55 to 60% on most recent echo from May of this year.  Clinically appears dry.  Ordered for a liter of IV fluid.  Given recent history of sepsis UTI, did send blood cultures and lactate.  Lactate did return slightly elevated at 2.1, but urinalysis actually resulted without evidence of infection.  Patient's ethanol level did return significantly elevated at 330, certainly  could be contributing to her weakness.  Otherwise without obvious infectious source.  Will plan for fluid resuscitation, repeat lactate.  Repeat lactate normalized to 1.9.  On reevaluation, patient reports feeling much improved with resolution of her weakness and is eager to be discharged home.  Elevated EtOH certainly could be contributing to her initial presentation.  Given her improved lactate and symptoms, as well as her otherwise reassuring workup, do think discharge is reasonable.  Patient clinically sober on reevaluation.  She was able to ambulate in the ER with steady gait.  She will have her brother come pick her up.  Do think discharge is reasonable.   Strict return precautions provided.     FINAL CLINICAL IMPRESSION(S) / ED DIAGNOSES   Final diagnoses:  Generalized weakness  Alcoholic intoxication without complication (HCC)     Rx / DC Orders   ED Discharge Orders     None        Note:  This document was prepared using Dragon voice recognition software and may include unintentional dictation errors.   Trinna Post, MD 12/09/22 [phone]

## 2022-12-08 NOTE — ED Triage Notes (Addendum)
Pt says that she has just been tired and feeling weak tonight. Denies pain or shortness of breath. Pt says that she feels shaky. Reports she has taken all of her medications today. HR irregular 93-135 during triage.

## 2022-12-08 NOTE — ED Triage Notes (Signed)
Pt arrives via [facility] from home, per their report the pt called out about 1 hour ago, stating she was weak and could not stand up. On ems arrival, pt was sitting on edge of bed, stating she started feeling weak all of a sudden. Hx of afib, diabetes. Has had 2 40oz drinks tonight. 12 lead showed afib, 92-120 (brother reporting that is normal for her.) 101/62, 98% RA. Cbg 191. A/O en route, negative stroke screen.

## 2022-12-09 DIAGNOSIS — R531 Weakness: Secondary | ICD-10-CM | POA: Diagnosis not present

## 2022-12-09 LAB — URINALYSIS, ROUTINE W REFLEX MICROSCOPIC
Bacteria, UA: NONE SEEN
Bilirubin Urine: NEGATIVE
Glucose, UA: NEGATIVE mg/dL
Ketones, ur: NEGATIVE mg/dL
Leukocytes,Ua: NEGATIVE
Nitrite: NEGATIVE
Protein, ur: NEGATIVE mg/dL
Specific Gravity, Urine: 1.009 (ref 1.005–1.030)
pH: 5 (ref 5.0–8.0)

## 2022-12-09 LAB — HEPATIC FUNCTION PANEL
ALT: 27 U/L (ref 0–44)
AST: 77 U/L — ABNORMAL HIGH (ref 15–41)
Albumin: 2.5 g/dL — ABNORMAL LOW (ref 3.5–5.0)
Alkaline Phosphatase: 102 U/L (ref 38–126)
Bilirubin, Direct: 0.3 mg/dL — ABNORMAL HIGH (ref 0.0–0.2)
Indirect Bilirubin: 0.4 mg/dL (ref 0.3–0.9)
Total Bilirubin: 0.7 mg/dL (ref 0.3–1.2)
Total Protein: 8.4 g/dL — ABNORMAL HIGH (ref 6.5–8.1)

## 2022-12-09 LAB — ETHANOL: Alcohol, Ethyl (B): 330 mg/dL (ref <10)

## 2022-12-09 LAB — LACTIC ACID, PLASMA
Lactic Acid, Venous: 1.9 mmol/L (ref 0.5–1.9)
Lactic Acid, Venous: 2.1 mmol/L (ref 0.5–1.9)

## 2022-12-09 NOTE — ED Notes (Signed)
Provided pt with discharge instructions and education. All of pt questions answered. Pt in possession of all belongings. Pt AAOX4 and stable at time of discharge. Wheeled to ED lobby and placed by entrance to await transport by brother.

## 2022-12-09 NOTE — ED Notes (Signed)
 Provided urine incontinence care. Replaced bed linen, bed pads, pt gown, and blankets. Pt repositioned, side rails up x2,  and call light placed back within reach. No further needs identified at this time.

## 2022-12-09 NOTE — ED Notes (Signed)
IV fluids still infusing, will collect lactic acid after completion. Marland Kitchen

## 2022-12-09 NOTE — Discharge Instructions (Addendum)
Follow-up with your primary care doctor for further evaluation of your symptoms.  Return to the ER for new or worsening symptoms.

## 2022-12-09 NOTE — ED Notes (Signed)
Called pt brother twice, number goes straight to voicemail.

## 2022-12-09 NOTE — ED Notes (Signed)
This RN able to reach pt brother on the phone. Brother is en route to pickup pt and take her home, brother has keys.

## 2022-12-09 NOTE — ED Notes (Signed)
Pt able to demonstrate ability to ambulate with steady gait around the room without assistance. Pt states she feels well enough to go home. Pt states she will call her brother for tx home.

## 2022-12-13 ENCOUNTER — Ambulatory Visit: Payer: Self-pay

## 2022-12-13 LAB — CULTURE, BLOOD (ROUTINE X 2): Culture: NO GROWTH

## 2022-12-13 NOTE — Patient Instructions (Addendum)
Visit Information  Thank you for taking time to visit with me today. Please don't hesitate to contact me if I can be of assistance to you.   Following are the goals we discussed today:   Goals Addressed             This Visit's Progress    Management of chronic health conditions       Interventions Today    Flowsheet Row Most Recent Value  Chronic Disease   Chronic disease during today's visit Atrial Fibrillation (AFib), Other  [recurrent UTI's]  General Interventions   General Interventions Discussed/Reviewed General Interventions Reviewed, Doctor Visits  [Evaluation of current treatment plan and patients adherence to plan as established by provider. Assessed for A-fib symptoms and ongoing UTI symptoms.]  Doctor Visits Discussed/Reviewed Doctor Visits Reviewed  Annabell Sabal upcoming provider visits. Advised to keep follow up appointment with providers.]  Education Interventions   Education Provided Provided Education, Provided Printed Education  [Advised to stay hydrated, wipe perineal area from front to back, keep perineal area clean, consider sugar free cranberry juice.  Education article sent to patient in the mail on urinary tract infections.]  Provided Verbal Education On Other  [Discussed Atrial fibrillations symptoms/ action plan.  Education article sent to patient for Atrial fibrillation.]  Pharmacy Interventions   Pharmacy Dicussed/Reviewed Pharmacy Topics Discussed  Pearletha Furl how  patient is managing her medication. Advised importance of taking medications as prescribed.]              Our next appointment is by telephone on 01/05/23 at 3:45 pm  Please call the care guide team at [phone] if you need to cancel or reschedule your appointment.   If you are experiencing a Mental Health or Behavioral Health Crisis or need someone to talk to, please call the Suicide and Crisis Lifeline: 988 call 1-800-273-TALK (toll free, 24 hour hotline)  Patient verbalizes  understanding of instructions and care plan provided today and agrees to view in MyChart. Active MyChart status and patient understanding of how to access instructions and care plan via MyChart confirmed with patient.     George Ina RN,BSN,CCM [facility] Care Coordination [phone] direct line   Urinary Tract Infection, Female A urinary tract infection (UTI) is an infection in your urinary tract. The urinary tract is made up of organs that make, store, and get rid of pee (urine) in your body. These organs include: The kidneys. The ureters. The bladder. The urethra. What are the causes? Most UTIs are caused by germs called bacteria. They may be in or near your genitals. These germs grow and cause swelling in your urinary tract. Females are more likely to get a UTI because the urethra is shorter and closer to the butt. What increases the risk? You're more likely to get a UTI if: You have a soft tube called a catheter that drains your pee. You can't control when you pee or poop. You have trouble peeing because of: A kidney stone. A urinary blockage. A nerve condition that affects your bladder. Not getting enough to drink. You're sexually active. You use a birth control inside your vagina, like spermicide. You're pregnant. You have low levels of the hormone estrogen in your body. You're an older adult. You're also more likely to get a UTI if you have other health problems. These may include: Diabetes. A weak immune system. Your immune system is your body's defense system. Sickle cell disease. Injury of the spine. What are the signs or symptoms? Symptoms may  include: Needing to pee right away. Peeing small amounts often. Pain or burning when you pee. Blood in your pee. Pee that smells bad or odd. Pain in your belly or lower back. You may also: Feel confused. This may be the first symptom in older adults. Vomit. Not feel hungry. Feel tired or easily annoyed. Have a fever or  chills. How is this diagnosed? A UTI is diagnosed based on your medical history and an exam. You may also have other tests. These may include: Pee tests. Blood tests. Tests for sexually transmitted infections (STIs). If you've had more than one UTI, you may need to have imaging studies done to find out why you keep getting them. How is this treated? A UTI can be treated by: Taking antibiotics or other medicines. Drinking enough fluid to keep your pee pale yellow. In rare cases, a UTI can cause a very bad condition called sepsis. Sepsis may be treated in the hospital. Follow these instructions at home: Medicines Take your medicines only as told by your health care provider. If you were given antibiotics, take them as told by your provider. Do not stop taking them even if you start to feel better. General instructions Make sure you: Pee often and fully. Do not hold your pee for a long time. Wipe from front to back after you pee or poop. Use each tissue only once when you wipe. Pee after you have sex. Do not douche or use sprays or powders in your genital area. Contact a health care provider if: Your symptoms don't get better after 1-2 days of taking antibiotics. Your symptoms go away and then come back. You have a fever or chills. You vomit or feel like you may vomit. Get help right away if: You have very bad pain in your back or lower belly. You faint. This information is not intended to replace advice given to you by your health care provider. Make sure you discuss any questions you have with your health care provider. Document Revised: 07/09/2022 Document Reviewed: 07/09/2022 Elsevier Patient Education  2024 [employer] Inc[employer]  Atrial Fibrillation Atrial fibrillation (AFib) is a type of heartbeat that is irregular or fast. If you have AFib, your heart beats without any order. This makes it hard for your heart to pump blood in a normal way. AFib may come and go, or it may become a  long-lasting problem. If AFib is not treated, it can put you at higher risk for stroke, heart failure, and other heart problems. What are the causes? AFib may be caused by diseases that damage the heart's electrical system. They include: High blood pressure. Heart failure. Heart valve diseases. Heart surgery. Diabetes. Thyroid disease. Kidney disease. Lung diseases, such as pneumonia or COPD. Sleep apnea. Sometimes the cause is not known. What increases the risk? You are more likely to develop AFib if: You are older. You exercise often and very hard. You have a family history of AFib. You are female. You are Caucasian. You are overweight. You smoke. You drink a lot of alcohol. What are the signs or symptoms? Common symptoms of this condition include: A feeling that your heart is beating very fast. Chest pain or discomfort. Feeling short of breath. Suddenly feeling light-headed or weak. Getting tired easily during activity. Fainting. Sweating. In some cases, there are no symptoms. How is this treated? Medicines to: Prevent blood clots. Treat heart rate or heart rhythm problems. Using devices, such as a pacemaker, to correct heart rhythm problems. Doing surgery  to remove the part of the heart that sends bad signals. Closing an area where clots can form in the heart (left atrial appendage). In some cases, your doctor will treat other underlying conditions. Follow these instructions at home: Medicines Take over-the-counter and prescription medicines only as told by your doctor. Do not take any new medicines without first talking to your doctor. If you are taking blood thinners: Talk with your doctor before taking aspirin or NSAIDs, such as ibuprofen. Take your medicines as told. Take them at the same time each day. Do not do things that could hurt or bruise you. Be careful to avoid falls. Wear an alert bracelet or carry a card that says you take blood  thinners. Lifestyle Do not smoke or use any products that contain nicotine or tobacco. If you need help quitting, ask your doctor. Eat heart-healthy foods. Talk with your doctor about the right eating plan for you. Exercise regularly as told by your doctor. Do not drink alcohol. Lose weight if you are overweight. General instructions If you have sleep apnea, treat it as told by your doctor. Do not use diet pills unless your doctor says they are safe for you. Diet pills may make heart problems worse. Keep all follow-up visits. Your doctor will check your heart rate and rhythm regularly. Contact a doctor if: You notice a change in the speed, rhythm, or strength of your heartbeat. You are taking a blood-thinning medicine and you get more bruising. You get tired more easily when you move or exercise. You have a sudden change in weight. Get help right away if:  You have pain in your chest. You have trouble breathing. You have side effects of blood thinners, such as blood in your vomit, poop (stool), or pee (urine), or bleeding that cannot stop. You have any signs of a stroke. "BE FAST" is an easy way to remember the main warning signs: B - Balance. Dizziness, sudden trouble walking, or loss of balance. E - Eyes. Trouble seeing or a change in how you see. F - Face. Sudden weakness or loss of feeling in the face. The face or eyelid may droop on one side. A - Arms.Weakness or loss of feeling in an arm. This happens suddenly and usually on one side of the body. S - Speech. Sudden trouble speaking, slurred speech, or trouble understanding what people say. T - Time.Time to call emergency services. Write down what time symptoms started. You have other signs of a stroke, such as: A sudden, very bad headache with no known cause. Feeling like you may vomit (nausea). Vomiting. A seizure. These symptoms may be an emergency. Get help right away. Call 911. Do not wait to see if the symptoms will go  away. Do not drive yourself to the hospital. This information is not intended to replace advice given to you by your health care provider. Make sure you discuss any questions you have with your health care provider. Document Revised: 12/23/2021 Document Reviewed: 12/23/2021 Elsevier Patient Education  2024 [employer].

## 2022-12-13 NOTE — Patient Outreach (Signed)
  Care Coordination   Initial Visit Note   12/13/2022 Name: KHRISTAL BAYDOUN MRN: [id number] DOB: 1956/04/03  Adriana Miller is a 67 y.o. year old female who sees McDonough, Salomon Fick, PA-C for primary care. I spoke with  Adriana Miller by phone today.  What matters to the patients health and wellness today?  Patient discussed recent hospital admission from 11/17/22 to 11/21/22 and ED visit on 12/08/22.  Patient states she was admitted to the hospital on 11/17/22 for Atrial fibrillation and UTI and ED visit for generalized weakness/ UTI.  Per chart review from ED visit patients Ethanol level elevated.  Patient states she lives alone. She reports having an aide daily and assistance from her brother and cousin.  Patient reports having transportation assistance with [employer].  Patient states her aide manages her medication box and prepares it for 1 month at a time. She reports having mail order for medications.   She also reports having meals on wheels and receiving food stamps. She states she is able to ambulate with walker and denies falls. Patient states she's had several urinary tract infections in the past.  She states she is scheduled to have follow up visit with primary care provider on 12/16/22 and follow up with cardiologist on 12/22/22.    Goals Addressed             This Visit's Progress    Management of chronic health conditions       Interventions Today    Flowsheet Row Most Recent Value  Chronic Disease   Chronic disease during today's visit Atrial Fibrillation (AFib), Other  [recurrent UTI's]  General Interventions   General Interventions Discussed/Reviewed General Interventions Reviewed, Doctor Visits  [Evaluation of current treatment plan and patients adherence to plan as established by provider. Assessed for A-fib symptoms and ongoing UTI symptoms.]  Doctor Visits Discussed/Reviewed Doctor Visits Reviewed  Annabell Sabal upcoming provider visits. Advised to keep follow up  appointment with providers.]  Education Interventions   Education Provided Provided Education, Provided Printed Education  [Advised to stay hydrated, wipe perineal area from front to back, keep perineal area clean, consider sugar free cranberry juice.  Education article sent to patient in the mail on urinary tract infections.]  Provided Verbal Education On Other  [Discussed Atrial fibrillations symptoms/ action plan.  Education article sent to patient for Atrial fibrillation.]  Pharmacy Interventions   Pharmacy Dicussed/Reviewed Pharmacy Topics Discussed  Pearletha Furl how  patient is managing her medication. Advised importance of taking medications as prescribed.]              SDOH assessments and interventions completed:  Yes  SDOH Interventions Today    Flowsheet Row Most Recent Value  SDOH Interventions   Food Insecurity Interventions Intervention Not Indicated  Housing Interventions Intervention Not Indicated  Transportation Interventions Intervention Not Indicated        Care Coordination Interventions:  Yes, provided   Follow up plan: Follow up call scheduled for 01/05/23    Encounter Outcome:  Pt. Visit Completed   George Ina RN,BSN,CCM [facility] Care Coordination [phone] direct line

## 2022-12-14 LAB — CULTURE, BLOOD (ROUTINE X 2): Culture: NO GROWTH

## 2022-12-16 ENCOUNTER — Ambulatory Visit (INDEPENDENT_AMBULATORY_CARE_PROVIDER_SITE_OTHER): Payer: 59 | Admitting: Physician Assistant

## 2022-12-16 ENCOUNTER — Other Ambulatory Visit: Payer: Self-pay | Admitting: Internal Medicine

## 2022-12-16 VITALS — BP 130/85 | HR 114 | Temp 98.2°F | Resp 16 | Ht 66.0 in | Wt 174.0 lb

## 2022-12-16 DIAGNOSIS — E119 Type 2 diabetes mellitus without complications: Secondary | ICD-10-CM | POA: Diagnosis not present

## 2022-12-16 DIAGNOSIS — R2689 Other abnormalities of gait and mobility: Secondary | ICD-10-CM

## 2022-12-16 DIAGNOSIS — Z794 Long term (current) use of insulin: Secondary | ICD-10-CM | POA: Diagnosis not present

## 2022-12-16 DIAGNOSIS — R531 Weakness: Secondary | ICD-10-CM | POA: Diagnosis not present

## 2022-12-16 DIAGNOSIS — I10 Essential (primary) hypertension: Secondary | ICD-10-CM | POA: Diagnosis not present

## 2022-12-16 NOTE — Progress Notes (Signed)
[facility] [address] [place], [place] [address]  Internal MEDICINE  Office Visit Note  Patient Name: Adriana Miller  [id number]  [id number]  Date of Service: 12/24/2022  Chief Complaint  Patient presents with   Hospitalization Follow-up    HPI Pt is here for ED follow up, her brother is with her -Micah Flesher to ED on 12/08/22 with generalized weakness. She had previous presented this way several weeks earlier with sepsis from UTI, however urine clear. Found to have AKI, elevated lactate and elevated ethanol which was believed to be the cause of weakness. Given fluids and she improved and was discharged home -Yesterday sugar dropped to 47, today is 114, she has a continuous monitor -insulin 10 units, unclear if she took a glimepiride. Patient had several old discharge med lists and clarified which is most current list to follow for pill box. Also discussed eating at regular intervals and avoiding alcohol -patient continues to have some weakness and requests power mobility device, therefore mobility exam completed -rolling walker gets away from her, cannot control it, limited upper and lower body strength. -lower extremity extension decreased -Upon further exam, patient's strength is reduced but would not meet qualifications for power wheelchair. Would be eligible for regular wheelchair and will order -not doing PT anymore, but would benefit from PT to help with strength and balance  Current Medication: Outpatient Encounter Medications as of 12/16/2022  Medication Sig   acetaminophen (TYLENOL) 325 MG tablet Take 2 tablets (650 mg total) by mouth every 6 (six) hours as needed for mild pain (or Fever >/= 101).   Alcohol Swabs (B-D SINGLE USE SWABS REGULAR) PADS USE AS DIRECTED TWICE DAILY   allopurinol (ZYLOPRIM) 100 MG tablet TAKE 1 TABLET BY MOUTH DAILY   Blood Glucose Monitoring Suppl (ACCU-CHEK GUIDE ME) w/Device KIT Use as directed  dx E11.65   Calcium Carb-Cholecalciferol  (CALCIUM 500/D) 500-5 MG-MCG TABS Take 1 tab po daily   Continuous Blood Gluc Receiver (DEXCOM G7 RECEIVER) DEVI Use for continuous blood glucose monitoring  DX E11.65   Continuous Glucose Sensor (DEXCOM G7 SENSOR) MISC USE AS DIRECTED FOR CONTINUOUS  GLUCOSE MONITORING. CHANGE  SENSOR EVERY 10 DAYS   ELIQUIS 5 MG TABS tablet TAKE 1 TABLET BY MOUTH TWICE  DAILY   ergocalciferol (VITAMIN D2) 1.25 MG (50000 UT) capsule Take 1 capsule (50,000 Units total) by mouth once a week.   escitalopram (LEXAPRO) 10 MG tablet TAKE 1 TABLET BY MOUTH DAILY FOR ANXIETY/SLEEP   folic acid (FOLVITE) 1 MG tablet Take 1 tablet (1 mg total) by mouth daily.   glucose blood (ACCU-CHEK GUIDE) test strip Use as instructed twice a day DX E11.65   insulin glargine, 1 Unit Dial, (TOUJEO SOLOSTAR) 300 UNIT/ML Solostar Pen Inject 10 Units into the skin daily with supper.   Insulin Pen Needle (BD PEN NEEDLE NANO 2ND GEN) 32G X 4 MM MISC USE AS DIRECTED WITH TOUJEO   metoprolol tartrate (LOPRESSOR) 25 MG tablet TAKE ONE-HALF TABLET BY MOUTH  TWICE DAILY   mirtazapine (REMERON) 7.5 MG tablet TAKE 1 TABLET BY MOUTH DAILY AT  BEDTIME   potassium chloride (KLOR-CON) 8 MEQ tablet TAKE 1 TABLET BY MOUTH 3 TIMES  WEEKLY ON MONDAY, WEDNESDAY, AND FRIDAY   Safety Lancets 28G MISC Use as directed once a daily Dx e11.65   spironolactone (ALDACTONE) 25 MG tablet TAKE 1 TABLET BY MOUTH DAILY FOR CIRRHOSIS OF LIVER   thiamine (VITAMIN B-1) 100 MG tablet Take 1 tablet (100 mg  total) by mouth daily.   traZODone (DESYREL) 50 MG tablet TAKE 1 TO 2 TABLETS BY MOUTH AT  BEDTIME FOR INSOMNIA   [DISCONTINUED] furosemide (LASIX) 20 MG tablet TAKE 1 TABLET (20 MG TOTAL) BY MOUTH DAILY.   No facility-administered encounter medications on file as of 12/16/2022.    Surgical History: Past Surgical History:  Procedure Laterality Date   CEREBRAL ANEURYSM REPAIR  1991   COLONOSCOPY WITH PROPOFOL N/A 02/12/2019   Procedure: COLONOSCOPY WITH PROPOFOL;   Surgeon: Toney Reil, MD;  Location: [facility] ENDOSCOPY;  Service: Gastroenterology;  Laterality: N/A;   ESOPHAGOGASTRODUODENOSCOPY (EGD) WITH PROPOFOL N/A 02/12/2019   Procedure: ESOPHAGOGASTRODUODENOSCOPY (EGD) WITH PROPOFOL;  Surgeon: Toney Reil, MD;  Location: [facility] ENDOSCOPY;  Service: Gastroenterology;  Laterality: N/A;   IR ANGIOGRAM SELECTIVE EACH ADDITIONAL VESSEL  09/21/2019   IR ANGIOGRAM SELECTIVE EACH ADDITIONAL VESSEL  09/21/2019   IR ANGIOGRAM SELECTIVE EACH ADDITIONAL VESSEL  09/21/2019   IR ANGIOGRAM SELECTIVE EACH ADDITIONAL VESSEL  09/21/2019   IR ANGIOGRAM SELECTIVE EACH ADDITIONAL VESSEL  10/02/2019   IR ANGIOGRAM VISCERAL SELECTIVE  09/21/2019   IR ANGIOGRAM VISCERAL SELECTIVE  09/21/2019   IR ANGIOGRAM VISCERAL SELECTIVE  10/02/2019   IR EMBO TUMOR ORGAN ISCHEMIA INFARCT INC GUIDE ROADMAPPING  10/02/2019   IR EMBO TUMOR ORGAN ISCHEMIA INFARCT INC GUIDE ROADMAPPING  10/02/2019   IR RADIOLOGIST EVAL & MGMT  08/28/2019   IR RADIOLOGIST EVAL & MGMT  01/30/2020   IR RADIOLOGIST EVAL & MGMT  04/30/2020   IR RADIOLOGIST EVAL & MGMT  07/30/2020   IR RADIOLOGIST EVAL & MGMT  10/28/2020   IR RADIOLOGIST EVAL & MGMT  02/24/2021   IR RADIOLOGIST EVAL & MGMT  06/08/2021   IR RADIOLOGIST EVAL & MGMT  11/24/2021   IR RADIOLOGIST EVAL & MGMT  05/25/2022   IR US GUIDE VASC ACCESS LEFT  09/21/2019   IR US GUIDE VASC ACCESS LEFT  10/02/2019   OPEN REDUCTION INTERNAL FIXATION (ORIF) DISTAL RADIAL FRACTURE Right 08/06/2021   Procedure: OPEN REDUCTION INTERNAL FIXATION (ORIF) DISTAL RADIAL FRACTURE;  Surgeon: Kennedy Bucker, MD;  Location: [facility] ORS;  Service: Orthopedics;  Laterality: Right;    Medical History: 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)    B12 deficiency 04/05/2017   Blood in stool    Cerebral aneurysm    Chronic kidney disease    Diabetes mellitus without complication (HCC)    type 2   Gastric erosion with bleeding     Hepatocellular carcinoma (HCC)    Hyperlipidemia    Hypertension    Lower extremity cellulitis 02/08/2019   Paroxysmal A-fib (HCC)    Seizure disorder (HCC)     Family History: Family History  Problem Relation Age of Onset   Heart attack Brother 14    Social History   Socioeconomic History   Marital status: Divorced    Spouse name: Not on file   Number of children: Not on file   Years of education: Not on file   Highest education level: Not on file  Occupational History   Not on file  Tobacco Use   Smoking status: Never   Smokeless tobacco: Current    Types: Snuff  Vaping Use   Vaping status: Never Used  Substance and Sexual Activity   Alcohol use: Not Currently    Alcohol/week: 14.0 standard drinks of alcohol    Types: 14 Cans of beer per week    Comment: twice  a week (beer)   Drug use: No   Sexual activity: Not Currently  Other Topics Concern   Not on file  Social History Narrative   Lives alone   Social Determinants of Health   Financial Resource Strain: Not on file  Food Insecurity: No Food Insecurity (12/13/2022)   Hunger Vital Sign    Worried About Running Out of Food in the Last Year: Never true    Ran Out of Food in the Last Year: Never true  Transportation Needs: No Transportation Needs (12/13/2022)   PRAPARE - Administrator, Civil Service (Medical): No    Lack of Transportation (Non-Medical): No  Physical Activity: Not on file  Stress: Not on file  Social Connections: Not on file  Intimate Partner Violence: Not At Risk (11/18/2022)   Humiliation, Afraid, Rape, and Kick questionnaire    Fear of Current or Ex-Partner: No    Emotionally Abused: No    Physically Abused: No    Sexually Abused: No      Review of Systems  Constitutional:  Negative for chills and unexpected weight change.  HENT:  Negative for congestion, rhinorrhea, sneezing and sore throat.   Eyes:  Negative for redness.  Respiratory:  Negative for cough, chest  tightness and shortness of breath.   Cardiovascular:  Negative for chest pain and palpitations.  Gastrointestinal:  Negative for abdominal pain, constipation, diarrhea, nausea and vomiting.  Genitourinary:  Negative for dysuria and frequency.  Musculoskeletal:  Positive for arthralgias. Negative for back pain, joint swelling and neck pain.  Skin:  Negative for rash.  Neurological:  Positive for weakness. Negative for tremors and numbness.  Hematological:  Negative for adenopathy. Does not bruise/bleed easily.  Psychiatric/Behavioral:  Negative for behavioral problems (Depression), sleep disturbance and suicidal ideas. The patient is not nervous/anxious.     Vital Signs: BP 130/85   Pulse (!) 114   Temp 98.2 F (36.8 C)   Resp 16   Ht 5\' 6"  (1.676 m)   Wt 174 lb (78.9 kg)   SpO2 99%   BMI 28.08 kg/m    Physical Exam Constitutional:      General: She is not in acute distress.    Appearance: She is well-developed. She is not diaphoretic.  HENT:     Head: Normocephalic and atraumatic.     Mouth/Throat:     Pharynx: No oropharyngeal exudate.  Eyes:     Pupils: Pupils are equal, round, and reactive to light.  Neck:     Thyroid: No thyromegaly.     Vascular: No JVD.     Trachea: No tracheal deviation.  Cardiovascular:     Rate and Rhythm: Normal rate and regular rhythm.     Heart sounds: Normal heart sounds. No murmur heard.    No friction rub. No gallop.  Pulmonary:     Effort: Pulmonary effort is normal. No respiratory distress.     Breath sounds: No wheezing or rales.  Chest:     Chest wall: No tenderness.  Abdominal:     General: Bowel sounds are normal.     Palpations: Abdomen is soft.  Musculoskeletal:        General: Normal range of motion.     Cervical back: Normal range of motion and neck supple.     Comments: RUE 4/5, LUE 4/5, RLE 3/5, LLE 3/5  Lymphadenopathy:     Cervical: No cervical adenopathy.  Skin:    General: Skin is warm and dry.  Neurological:  Mental Status: She is alert and oriented to person, place, and time.     Cranial Nerves: No cranial nerve deficit.     Comments: Utilizes cane for mobility  Psychiatric:        Behavior: Behavior normal.        Thought Content: Thought content normal.        Judgment: Judgment normal.        Assessment/Plan: 1. Weakness - Ambulatory referral to Home Health  2. Balance problem - Ambulatory referral to Home Health  3. Type 2 diabetes mellitus without complication, with long-term current use of insulin (HCC) One low reading yesterday, but otherwise stable. Will confirm not taking amaryl anymore and clarified correct med list  4. Essential hypertension Stable, continue current medication   General Counseling: bettie saltz understanding of the findings of todays visit and agrees with plan of treatment. I have discussed any further diagnostic evaluation that may be needed or ordered today. We also reviewed her medications today. she has been encouraged to call the office with any questions or concerns that should arise related to todays visit.    Orders Placed This Encounter  Procedures   DME Wheelchair manual   Ambulatory referral to Home Health    No orders of the defined types were placed in this encounter.   This patient was seen by Lynn Ito, PA-C in collaboration with Dr. Beverely Risen as a part of collaborative care agreement.   Total time spent:35 Minutes Time spent includes review of chart, medications, test results, and follow up plan with the patient.      Dr Lyndon Code Internal medicine

## 2022-12-21 NOTE — H&P (View-Only) (Signed)
Cardiology Office Note    Date:  12/22/2022   ID:  ZAHIRAH NORGREN, DOB 11-25-1955, MRN [id number]  PCP:  Carlean Jews, PA-C  Cardiologist:  Yvonne Kendall, MD  Electrophysiologist:  None   Chief Complaint: Follow up  History of Present Illness:   Adriana Miller is a 67 y.o. female with history of PAF/flutter on apixaban, DM2, CKD stage II, cirrhosis complicated by GI bleed secondary to erosive gastropathy in 01/2019 and hepatocellular carcinoma, seizure disorder, cerebral aneurysm status post repair, HTN, and HLD who presents for follow-up of A-fib/flutter.   She was diagnosed with new onset A-fib in 09/2018 and referred to the ED where she was placed on diltiazem and apixaban.  Plan was to pursue TEE guided DCCV upon establishing with cardiology in 12/2018.  However, she spontaneously converted to sinus rhythm.  Echo from 12/2018 showed an EF of 55 to 60%, normal RV systolic function and ventricular cavity size, trivial tricuspid regurgitation, and an estimated right atrial pressure of 3 mmHg.  She was diagnosed with atrial flutter in 05/2019 with subsequent spontaneous conversion noted at follow-up in 06/2019.  She was noted to be back in A-fib with RVR in 07/2019 and transferred to the ED where she was hydrated and administered IV diltiazem for rate control with outpatient follow-up.  In follow-up in 10/2019, she had converted to sinus bradycardia, with sinus rhythm noted at cardiology visits thereafter.    She has had multiple hospital admissions over this past year, including several times in 05/2022 for mechanical fall associated with rhabdomyolysis.  In 08/2021 with intractable nausea and vomiting and A-fib with RVR with symptomatic improvement with IV hydration and rate control.  Echo in 08/2022 showed an EF of 55 to 60%, no regional wall motion abnormalities, normal RV systolic function and ventricular cavity size, mildly to moderately dilated left atrium, and no significant valvular  abnormalities.  She was admitted in 09/2022 with E. coli UTI complicated by A-fib with RVR and AKI.  She was last seen in the office on 11/16/2019 for routine follow-up.  She was maintaining sinus rhythm at that time, and was without symptoms of angina or cardiac decompensation.  She reported she was preparing her own medications, though was unclear what she was taking outside of allopurinol, apixaban, and furosemide.  Further recommendations were unable to be made given lack of clarity from a medication perspective with recommendation for home health aide to assist with pillbox or pill pockets.  She was admitted to the hospital on 7/31 through 11/21/2018 for with severe sepsis secondary to E. coli UTI.  She was in atrial flutter with RVR with rates in the 1 teens bpm.  She was managed by the hospitalist service with recommendation to continue metoprolol and apixaban for her A-fib/flutter.  CTA chest showed no acute intracranial abnormality with chronic small vessel changes and prior right craniotomy.  CT of the abdomen/pelvis showed cholelithiasis without evidence of acute cholecystitis, hepatic steatosis, small hiatal hernia, and small uterine fibroid.  She was seen in the ED on 12/08/2022 with weakness.  She remained in atrial flutter with variable AV block with rates in the 1 teens bpm.  Ethanol elevated at 330.  She was felt to be dehydrated and treated with IV fluids with resolution of weakness.  She comes in doing well from a cardiac perspective and is without symptoms of angina or cardiac decompensation.  She does continue to note some ongoing fatigue, though this is improving.  No falls,  hematochezia, or melena.  She has not had a drink of alcohol in over 2 weeks.  She remains adherent to cardiac medications, including anticoagulation (denies missing any doses of anticoagulation, taking apixaban 5 mg twice daily).  No palpitations, dyspnea, dizziness, presyncope, or syncope.  No lower extremity swelling  or progressive orthopnea.  Overall, she feels like she is doing well and does not have any acute cardiac concerns at this time.   Labs independently reviewed: 12/2022 - Hgb 10.8, PLT 122, CMP and A1c pending 11/2022 - albumin 2.5, AST 77, ALT normal, Hgb 11.2, PLT 155, potassium 3.9, BUN 32, serum creatinine 2.14, A1c 5.8 10/2022 - TSH normal, magnesium 2.1 10/2021 - TC 134, TG 71, HDL 32, LDL 88   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)    B12 deficiency 04/05/2017   Blood in stool    Cerebral aneurysm    Chronic kidney disease    Diabetes mellitus without complication (HCC)    type 2   Gastric erosion with bleeding    Hepatocellular carcinoma (HCC)    Hyperlipidemia    Hypertension    Lower extremity cellulitis 02/08/2019   Paroxysmal A-fib (HCC)    Seizure disorder [facility])     Past Surgical History:  Procedure Laterality Date   CEREBRAL ANEURYSM REPAIR  1991   COLONOSCOPY WITH PROPOFOL N/A 02/12/2019   Procedure: COLONOSCOPY WITH PROPOFOL;  Surgeon: Toney Reil, MD;  Location: [facility] ENDOSCOPY;  Service: Gastroenterology;  Laterality: N/A;   ESOPHAGOGASTRODUODENOSCOPY (EGD) WITH PROPOFOL N/A 02/12/2019   Procedure: ESOPHAGOGASTRODUODENOSCOPY (EGD) WITH PROPOFOL;  Surgeon: Toney Reil, MD;  Location: [facility] ENDOSCOPY;  Service: Gastroenterology;  Laterality: N/A;   IR ANGIOGRAM SELECTIVE EACH ADDITIONAL VESSEL  09/21/2019   IR ANGIOGRAM SELECTIVE EACH ADDITIONAL VESSEL  09/21/2019   IR ANGIOGRAM SELECTIVE EACH ADDITIONAL VESSEL  09/21/2019   IR ANGIOGRAM SELECTIVE EACH ADDITIONAL VESSEL  09/21/2019   IR ANGIOGRAM SELECTIVE EACH ADDITIONAL VESSEL  10/02/2019   IR ANGIOGRAM VISCERAL SELECTIVE  09/21/2019   IR ANGIOGRAM VISCERAL SELECTIVE  09/21/2019   IR ANGIOGRAM VISCERAL SELECTIVE  10/02/2019   IR EMBO TUMOR ORGAN ISCHEMIA INFARCT INC GUIDE ROADMAPPING  10/02/2019   IR EMBO TUMOR ORGAN ISCHEMIA INFARCT INC GUIDE ROADMAPPING   10/02/2019   IR RADIOLOGIST EVAL & MGMT  08/28/2019   IR RADIOLOGIST EVAL & MGMT  01/30/2020   IR RADIOLOGIST EVAL & MGMT  04/30/2020   IR RADIOLOGIST EVAL & MGMT  07/30/2020   IR RADIOLOGIST EVAL & MGMT  10/28/2020   IR RADIOLOGIST EVAL & MGMT  02/24/2021   IR RADIOLOGIST EVAL & MGMT  06/08/2021   IR RADIOLOGIST EVAL & MGMT  11/24/2021   IR RADIOLOGIST EVAL & MGMT  05/25/2022   IR US GUIDE VASC ACCESS LEFT  09/21/2019   IR US GUIDE VASC ACCESS LEFT  10/02/2019   OPEN REDUCTION INTERNAL FIXATION (ORIF) DISTAL RADIAL FRACTURE Right 08/06/2021   Procedure: OPEN REDUCTION INTERNAL FIXATION (ORIF) DISTAL RADIAL FRACTURE;  Surgeon: Kennedy Bucker, MD;  Location: [facility] ORS;  Service: Orthopedics;  Laterality: Right;    Current Medications: Current Meds  Medication Sig   acetaminophen (TYLENOL) 325 MG tablet Take 2 tablets (650 mg total) by mouth every 6 (six) hours as needed for mild pain (or Fever >/= 101).   Alcohol Swabs (B-D SINGLE USE SWABS REGULAR) PADS USE AS DIRECTED TWICE DAILY   allopurinol (ZYLOPRIM) 100 MG tablet TAKE 1 TABLET BY MOUTH DAILY  Blood Glucose Monitoring Suppl (ACCU-CHEK GUIDE ME) w/Device KIT Use as directed  dx E11.65   Calcium Carb-Cholecalciferol (CALCIUM 500/D) 500-5 MG-MCG TABS Take 1 tab po daily   Continuous Blood Gluc Receiver (DEXCOM G7 RECEIVER) DEVI Use for continuous blood glucose monitoring  DX E11.65   Continuous Glucose Sensor (DEXCOM G7 SENSOR) MISC USE AS DIRECTED FOR CONTINUOUS  GLUCOSE MONITORING. CHANGE  SENSOR EVERY 10 DAYS   ELIQUIS 5 MG TABS tablet TAKE 1 TABLET BY MOUTH TWICE  DAILY   ergocalciferol (VITAMIN D2) 1.25 MG (50000 UT) capsule Take 1 capsule (50,000 Units total) by mouth once a week.   escitalopram (LEXAPRO) 10 MG tablet TAKE 1 TABLET BY MOUTH DAILY FOR ANXIETY/SLEEP   folic acid (FOLVITE) 1 MG tablet Take 1 tablet (1 mg total) by mouth daily.   furosemide (LASIX) 20 MG tablet TAKE 1 TABLET BY MOUTH EVERY DAY   glimepiride (AMARYL) 1 MG tablet  Take 1 mg by mouth daily.   glucose blood (ACCU-CHEK GUIDE) test strip Use as instructed twice a day DX E11.65   insulin glargine, 1 Unit Dial, (TOUJEO SOLOSTAR) 300 UNIT/ML Solostar Pen Inject 10 Units into the skin daily with supper.   Insulin Pen Needle (BD PEN NEEDLE NANO 2ND GEN) 32G X 4 MM MISC USE AS DIRECTED WITH TOUJEO   metoprolol tartrate (LOPRESSOR) 25 MG tablet TAKE ONE-HALF TABLET BY MOUTH  TWICE DAILY   mirtazapine (REMERON) 7.5 MG tablet TAKE 1 TABLET BY MOUTH DAILY AT  BEDTIME   potassium chloride (KLOR-CON) 8 MEQ tablet TAKE 1 TABLET BY MOUTH 3 TIMES  WEEKLY ON MONDAY, WEDNESDAY, AND FRIDAY   Safety Lancets 28G MISC Use as directed once a daily Dx e11.65   spironolactone (ALDACTONE) 25 MG tablet TAKE 1 TABLET BY MOUTH DAILY FOR CIRRHOSIS OF LIVER   thiamine (VITAMIN B-1) 100 MG tablet Take 1 tablet (100 mg total) by mouth daily.   traZODone (DESYREL) 50 MG tablet TAKE 1 TO 2 TABLETS BY MOUTH AT  BEDTIME FOR INSOMNIA    Allergies:   Aspirin   Social History   Socioeconomic History   Marital status: Divorced    Spouse name: Not on file   Number of children: Not on file   Years of education: Not on file   Highest education level: Not on file  Occupational History   Not on file  Tobacco Use   Smoking status: Never   Smokeless tobacco: Current    Types: Snuff  Vaping Use   Vaping status: Never Used  Substance and Sexual Activity   Alcohol use: Not Currently    Alcohol/week: 14.0 standard drinks of alcohol    Types: 14 Cans of beer per week    Comment: twice a week (beer)   Drug use: No   Sexual activity: Not Currently  Other Topics Concern   Not on file  Social History Narrative   Lives alone   Social Determinants of Health   Financial Resource Strain: Not on file  Food Insecurity: No Food Insecurity (12/13/2022)   Hunger Vital Sign    Worried About Running Out of Food in the Last Year: Never true    Ran Out of Food in the Last Year: Never true   Transportation Needs: No Transportation Needs (12/13/2022)   PRAPARE - Administrator, Civil Service (Medical): No    Lack of Transportation (Non-Medical): No  Physical Activity: Not on file  Stress: Not on file  Social Connections: Not on file  Family History:  The patient's family history includes Heart attack (age of onset: 54) in her brother.  ROS:   12-point review of systems is negative unless otherwise noted in the HPI.   EKGs/Labs/Other Studies Reviewed:    Studies reviewed were summarized above. The additional studies were reviewed today:  2D echo 08/22/2022: 1. Left ventricular ejection fraction, by estimation, is 55 to 60%. The  left ventricle has normal function. The left ventricle has no regional  wall motion abnormalities. Left ventricular diastolic parameters are  indeterminate.   2. Right ventricular systolic function is normal. The right ventricular  size is normal.   3. Left atrial size was mild to moderately dilated.   4. The mitral valve is normal in structure. No evidence of mitral valve  regurgitation.   5. The aortic valve is bicuspid. Aortic valve regurgitation is not  visualized.  __________   2D echo 01/15/2019: 1. Left ventricular ejection fraction, by visual estimation, is 55 to  60%. The left ventricle has normal function. Normal left ventricular size.  There is no left ventricular hypertrophy.   2. Global right ventricle has normal systolic function.The right  ventricular size is normal. No increase in right ventricular wall  thickness.   3. Left atrial size was normal.   4. Right atrial size was normal.   5. The mitral valve is normal in structure. No evidence of mitral valve  regurgitation. No evidence of mitral stenosis.   6. The tricuspid valve is normal in structure. Tricuspid valve  regurgitation is trivial.   7. The aortic valve is normal in structure. Aortic valve regurgitation  was not visualized by color flow Doppler.  Structurally normal aortic  valve, with no evidence of sclerosis or stenosis.   8. The pulmonic valve was normal in structure. Pulmonic valve  regurgitation is trivial by color flow Doppler.   9. TR signal is inadequate for assessing pulmonary artery systolic  pressure.  10. The inferior vena cava is normal in size with greater than 50%  respiratory variability, suggesting right atrial pressure of 3 mmHg.    EKG:  EKG is ordered today.  The EKG ordered today demonstrates atrial flutter with variable AV block, 99 bpm, nonspecific ST-T changes  Recent Labs: 11/17/2022: B Natriuretic Peptide 152.5; Magnesium 2.1; TSH 3.029 12/22/2022: ALT 32; BUN 18; Creatinine, Ser 1.87; Hemoglobin 10.8; Platelets 122; Potassium 3.9; Sodium 135  Recent Lipid Panel    Component Value Date/Time   CHOL 134 11/16/2021 1716   CHOL 134 01/27/2018 1340   TRIG 71 11/16/2021 1716   HDL 32 (L) 11/16/2021 1716   HDL 80 01/27/2018 1340   CHOLHDL 4.2 11/16/2021 1716   VLDL 14 11/16/2021 1716   LDLCALC 88 11/16/2021 1716   LDLCALC 42 01/27/2018 1340    PHYSICAL EXAM:    VS:  BP 106/70 (BP Location: Left Arm, Patient Position: Sitting, Cuff Size: Normal)   Pulse 99   Ht 5\' 6"  (1.676 m)   Wt 169 lb (76.7 kg)   SpO2 97%   BMI 27.28 kg/m   BMI: Body mass index is 27.28 kg/m.  Physical Exam Vitals reviewed.  Constitutional:      Appearance: She is well-developed.  HENT:     Head: Normocephalic and atraumatic.  Eyes:     General:        Right eye: No discharge.        Left eye: No discharge.  Neck:     Vascular: No JVD.  Cardiovascular:  Rate and Rhythm: Normal rate. Rhythm irregularly irregular.     Pulses:          Posterior tibial pulses are 2+ on the right side and 2+ on the left side.     Heart sounds: Normal heart sounds, S1 normal and S2 normal. Heart sounds not distant. No midsystolic click and no opening snap. No murmur heard.    No friction rub.  Pulmonary:     Effort: Pulmonary effort  is normal. No respiratory distress.     Breath sounds: Normal breath sounds. No decreased breath sounds, wheezing or rales.  Chest:     Chest wall: No tenderness.  Abdominal:     General: There is no distension.  Musculoskeletal:     Cervical back: Normal range of motion.     Right lower leg: No edema.     Left lower leg: No edema.  Skin:    General: Skin is warm and dry.     Nails: There is no clubbing.  Neurological:     Mental Status: She is alert and oriented to person, place, and time.  Psychiatric:        Speech: Speech normal.        Behavior: Behavior normal.        Thought Content: Thought content normal.        Judgment: Judgment normal.     Wt Readings from Last 3 Encounters:  12/22/22 169 lb (76.7 kg)  12/16/22 174 lb (78.9 kg)  12/08/22 180 lb (81.6 kg)     ASSESSMENT & PLAN:   PAF/flutter: She remains in A-fib/flutter which has been persistent since 11/17/2022.  She indicates that she has been adherent to apixaban twice daily, and denies missing any doses.  The importance of not missing any doses was discussed in detail.  Schedule DCCV.  CHA2DS2-VASc at least 4, remains on apixaban 5 mg twice daily and does not meet reduced dosing criteria.  Recent CBC stable.  Renal function and electrolytes pending.  No falls since she was last seen.  However, if she has recurrent falls we may need to consider EP referral for consideration of Watchman.  HTN: Blood pressure is well-controlled in the office today.  She remains on Lopressor and spironolactone.  Follow-up labs pending.  Cirrhosis: Remains on furosemide and spironolactone with ongoing follow-up per hepatology.    Informed Consent   Shared Decision Making/Informed Consent{  The risks (stroke, cardiac arrhythmias rarely resulting in the need for a temporary or permanent pacemaker, skin irritation or burns and complications associated with conscious sedation including aspiration, arrhythmia, respiratory failure and  death), benefits (restoration of normal sinus rhythm) and alternatives of a direct current cardioversion were explained in detail to Ms. Swann and she agrees to proceed.          Disposition: F/u with Dr. Okey Dupre or an APP in 1 month.   Medication Adjustments/Labs and Tests Ordered: Current medicines are reviewed at length with the patient today.  Concerns regarding medicines are outlined above. Medication changes, Labs and Tests ordered today are summarized above and listed in the Patient Instructions accessible in Encounters.   Signed, Eula Listen, PA-C 12/22/2022 4:35 PM     [facility] [place] [address] [place], [place] [address] [phone]

## 2022-12-21 NOTE — Progress Notes (Signed)
Cardiology Office Note    Date:  12/22/2022   ID:  ZAHIRAH NORGREN, DOB 11-25-1955, MRN [id number]  PCP:  Carlean Jews, PA-C  Cardiologist:  Yvonne Kendall, MD  Electrophysiologist:  None   Chief Complaint: Follow up  History of Present Illness:   Adriana Miller is a 67 y.o. female with history of PAF/flutter on apixaban, DM2, CKD stage II, cirrhosis complicated by GI bleed secondary to erosive gastropathy in 01/2019 and hepatocellular carcinoma, seizure disorder, cerebral aneurysm status post repair, HTN, and HLD who presents for follow-up of A-fib/flutter.   She was diagnosed with new onset A-fib in 09/2018 and referred to the ED where she was placed on diltiazem and apixaban.  Plan was to pursue TEE guided DCCV upon establishing with cardiology in 12/2018.  However, she spontaneously converted to sinus rhythm.  Echo from 12/2018 showed an EF of 55 to 60%, normal RV systolic function and ventricular cavity size, trivial tricuspid regurgitation, and an estimated right atrial pressure of 3 mmHg.  She was diagnosed with atrial flutter in 05/2019 with subsequent spontaneous conversion noted at follow-up in 06/2019.  She was noted to be back in A-fib with RVR in 07/2019 and transferred to the ED where she was hydrated and administered IV diltiazem for rate control with outpatient follow-up.  In follow-up in 10/2019, she had converted to sinus bradycardia, with sinus rhythm noted at cardiology visits thereafter.    She has had multiple hospital admissions over this past year, including several times in 05/2022 for mechanical fall associated with rhabdomyolysis.  In 08/2021 with intractable nausea and vomiting and A-fib with RVR with symptomatic improvement with IV hydration and rate control.  Echo in 08/2022 showed an EF of 55 to 60%, no regional wall motion abnormalities, normal RV systolic function and ventricular cavity size, mildly to moderately dilated left atrium, and no significant valvular  abnormalities.  She was admitted in 09/2022 with E. coli UTI complicated by A-fib with RVR and AKI.  She was last seen in the office on 11/16/2019 for routine follow-up.  She was maintaining sinus rhythm at that time, and was without symptoms of angina or cardiac decompensation.  She reported she was preparing her own medications, though was unclear what she was taking outside of allopurinol, apixaban, and furosemide.  Further recommendations were unable to be made given lack of clarity from a medication perspective with recommendation for home health aide to assist with pillbox or pill pockets.  She was admitted to the hospital on 7/31 through 11/21/2018 for with severe sepsis secondary to E. coli UTI.  She was in atrial flutter with RVR with rates in the 1 teens bpm.  She was managed by the hospitalist service with recommendation to continue metoprolol and apixaban for her A-fib/flutter.  CTA chest showed no acute intracranial abnormality with chronic small vessel changes and prior right craniotomy.  CT of the abdomen/pelvis showed cholelithiasis without evidence of acute cholecystitis, hepatic steatosis, small hiatal hernia, and small uterine fibroid.  She was seen in the ED on 12/08/2022 with weakness.  She remained in atrial flutter with variable AV block with rates in the 1 teens bpm.  Ethanol elevated at 330.  She was felt to be dehydrated and treated with IV fluids with resolution of weakness.  She comes in doing well from a cardiac perspective and is without symptoms of angina or cardiac decompensation.  She does continue to note some ongoing fatigue, though this is improving.  No falls,  hematochezia, or melena.  She has not had a drink of alcohol in over 2 weeks.  She remains adherent to cardiac medications, including anticoagulation (denies missing any doses of anticoagulation, taking apixaban 5 mg twice daily).  No palpitations, dyspnea, dizziness, presyncope, or syncope.  No lower extremity swelling  or progressive orthopnea.  Overall, she feels like she is doing well and does not have any acute cardiac concerns at this time.   Labs independently reviewed: 12/2022 - Hgb 10.8, PLT 122, CMP and A1c pending 11/2022 - albumin 2.5, AST 77, ALT normal, Hgb 11.2, PLT 155, potassium 3.9, BUN 32, serum creatinine 2.14, A1c 5.8 10/2022 - TSH normal, magnesium 2.1 10/2021 - TC 134, TG 71, HDL 32, LDL 88   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)    B12 deficiency 04/05/2017   Blood in stool    Cerebral aneurysm    Chronic kidney disease    Diabetes mellitus without complication (HCC)    type 2   Gastric erosion with bleeding    Hepatocellular carcinoma (HCC)    Hyperlipidemia    Hypertension    Lower extremity cellulitis 02/08/2019   Paroxysmal A-fib (HCC)    Seizure disorder [facility])     Past Surgical History:  Procedure Laterality Date   CEREBRAL ANEURYSM REPAIR  1991   COLONOSCOPY WITH PROPOFOL N/A 02/12/2019   Procedure: COLONOSCOPY WITH PROPOFOL;  Surgeon: Toney Reil, MD;  Location: [facility] ENDOSCOPY;  Service: Gastroenterology;  Laterality: N/A;   ESOPHAGOGASTRODUODENOSCOPY (EGD) WITH PROPOFOL N/A 02/12/2019   Procedure: ESOPHAGOGASTRODUODENOSCOPY (EGD) WITH PROPOFOL;  Surgeon: Toney Reil, MD;  Location: [facility] ENDOSCOPY;  Service: Gastroenterology;  Laterality: N/A;   IR ANGIOGRAM SELECTIVE EACH ADDITIONAL VESSEL  09/21/2019   IR ANGIOGRAM SELECTIVE EACH ADDITIONAL VESSEL  09/21/2019   IR ANGIOGRAM SELECTIVE EACH ADDITIONAL VESSEL  09/21/2019   IR ANGIOGRAM SELECTIVE EACH ADDITIONAL VESSEL  09/21/2019   IR ANGIOGRAM SELECTIVE EACH ADDITIONAL VESSEL  10/02/2019   IR ANGIOGRAM VISCERAL SELECTIVE  09/21/2019   IR ANGIOGRAM VISCERAL SELECTIVE  09/21/2019   IR ANGIOGRAM VISCERAL SELECTIVE  10/02/2019   IR EMBO TUMOR ORGAN ISCHEMIA INFARCT INC GUIDE ROADMAPPING  10/02/2019   IR EMBO TUMOR ORGAN ISCHEMIA INFARCT INC GUIDE ROADMAPPING   10/02/2019   IR RADIOLOGIST EVAL & MGMT  08/28/2019   IR RADIOLOGIST EVAL & MGMT  01/30/2020   IR RADIOLOGIST EVAL & MGMT  04/30/2020   IR RADIOLOGIST EVAL & MGMT  07/30/2020   IR RADIOLOGIST EVAL & MGMT  10/28/2020   IR RADIOLOGIST EVAL & MGMT  02/24/2021   IR RADIOLOGIST EVAL & MGMT  06/08/2021   IR RADIOLOGIST EVAL & MGMT  11/24/2021   IR RADIOLOGIST EVAL & MGMT  05/25/2022   IR US GUIDE VASC ACCESS LEFT  09/21/2019   IR US GUIDE VASC ACCESS LEFT  10/02/2019   OPEN REDUCTION INTERNAL FIXATION (ORIF) DISTAL RADIAL FRACTURE Right 08/06/2021   Procedure: OPEN REDUCTION INTERNAL FIXATION (ORIF) DISTAL RADIAL FRACTURE;  Surgeon: Kennedy Bucker, MD;  Location: [facility] ORS;  Service: Orthopedics;  Laterality: Right;    Current Medications: Current Meds  Medication Sig   acetaminophen (TYLENOL) 325 MG tablet Take 2 tablets (650 mg total) by mouth every 6 (six) hours as needed for mild pain (or Fever >/= 101).   Alcohol Swabs (B-D SINGLE USE SWABS REGULAR) PADS USE AS DIRECTED TWICE DAILY   allopurinol (ZYLOPRIM) 100 MG tablet TAKE 1 TABLET BY MOUTH DAILY  Blood Glucose Monitoring Suppl (ACCU-CHEK GUIDE ME) w/Device KIT Use as directed  dx E11.65   Calcium Carb-Cholecalciferol (CALCIUM 500/D) 500-5 MG-MCG TABS Take 1 tab po daily   Continuous Blood Gluc Receiver (DEXCOM G7 RECEIVER) DEVI Use for continuous blood glucose monitoring  DX E11.65   Continuous Glucose Sensor (DEXCOM G7 SENSOR) MISC USE AS DIRECTED FOR CONTINUOUS  GLUCOSE MONITORING. CHANGE  SENSOR EVERY 10 DAYS   ELIQUIS 5 MG TABS tablet TAKE 1 TABLET BY MOUTH TWICE  DAILY   ergocalciferol (VITAMIN D2) 1.25 MG (50000 UT) capsule Take 1 capsule (50,000 Units total) by mouth once a week.   escitalopram (LEXAPRO) 10 MG tablet TAKE 1 TABLET BY MOUTH DAILY FOR ANXIETY/SLEEP   folic acid (FOLVITE) 1 MG tablet Take 1 tablet (1 mg total) by mouth daily.   furosemide (LASIX) 20 MG tablet TAKE 1 TABLET BY MOUTH EVERY DAY   glimepiride (AMARYL) 1 MG tablet  Take 1 mg by mouth daily.   glucose blood (ACCU-CHEK GUIDE) test strip Use as instructed twice a day DX E11.65   insulin glargine, 1 Unit Dial, (TOUJEO SOLOSTAR) 300 UNIT/ML Solostar Pen Inject 10 Units into the skin daily with supper.   Insulin Pen Needle (BD PEN NEEDLE NANO 2ND GEN) 32G X 4 MM MISC USE AS DIRECTED WITH TOUJEO   metoprolol tartrate (LOPRESSOR) 25 MG tablet TAKE ONE-HALF TABLET BY MOUTH  TWICE DAILY   mirtazapine (REMERON) 7.5 MG tablet TAKE 1 TABLET BY MOUTH DAILY AT  BEDTIME   potassium chloride (KLOR-CON) 8 MEQ tablet TAKE 1 TABLET BY MOUTH 3 TIMES  WEEKLY ON MONDAY, WEDNESDAY, AND FRIDAY   Safety Lancets 28G MISC Use as directed once a daily Dx e11.65   spironolactone (ALDACTONE) 25 MG tablet TAKE 1 TABLET BY MOUTH DAILY FOR CIRRHOSIS OF LIVER   thiamine (VITAMIN B-1) 100 MG tablet Take 1 tablet (100 mg total) by mouth daily.   traZODone (DESYREL) 50 MG tablet TAKE 1 TO 2 TABLETS BY MOUTH AT  BEDTIME FOR INSOMNIA    Allergies:   Aspirin   Social History   Socioeconomic History   Marital status: Divorced    Spouse name: Not on file   Number of children: Not on file   Years of education: Not on file   Highest education level: Not on file  Occupational History   Not on file  Tobacco Use   Smoking status: Never   Smokeless tobacco: Current    Types: Snuff  Vaping Use   Vaping status: Never Used  Substance and Sexual Activity   Alcohol use: Not Currently    Alcohol/week: 14.0 standard drinks of alcohol    Types: 14 Cans of beer per week    Comment: twice a week (beer)   Drug use: No   Sexual activity: Not Currently  Other Topics Concern   Not on file  Social History Narrative   Lives alone   Social Determinants of Health   Financial Resource Strain: Not on file  Food Insecurity: No Food Insecurity (12/13/2022)   Hunger Vital Sign    Worried About Running Out of Food in the Last Year: Never true    Ran Out of Food in the Last Year: Never true   Transportation Needs: No Transportation Needs (12/13/2022)   PRAPARE - Administrator, Civil Service (Medical): No    Lack of Transportation (Non-Medical): No  Physical Activity: Not on file  Stress: Not on file  Social Connections: Not on file  Family History:  The patient's family history includes Heart attack (age of onset: 54) in her brother.  ROS:   12-point review of systems is negative unless otherwise noted in the HPI.   EKGs/Labs/Other Studies Reviewed:    Studies reviewed were summarized above. The additional studies were reviewed today:  2D echo 08/22/2022: 1. Left ventricular ejection fraction, by estimation, is 55 to 60%. The  left ventricle has normal function. The left ventricle has no regional  wall motion abnormalities. Left ventricular diastolic parameters are  indeterminate.   2. Right ventricular systolic function is normal. The right ventricular  size is normal.   3. Left atrial size was mild to moderately dilated.   4. The mitral valve is normal in structure. No evidence of mitral valve  regurgitation.   5. The aortic valve is bicuspid. Aortic valve regurgitation is not  visualized.  __________   2D echo 01/15/2019: 1. Left ventricular ejection fraction, by visual estimation, is 55 to  60%. The left ventricle has normal function. Normal left ventricular size.  There is no left ventricular hypertrophy.   2. Global right ventricle has normal systolic function.The right  ventricular size is normal. No increase in right ventricular wall  thickness.   3. Left atrial size was normal.   4. Right atrial size was normal.   5. The mitral valve is normal in structure. No evidence of mitral valve  regurgitation. No evidence of mitral stenosis.   6. The tricuspid valve is normal in structure. Tricuspid valve  regurgitation is trivial.   7. The aortic valve is normal in structure. Aortic valve regurgitation  was not visualized by color flow Doppler.  Structurally normal aortic  valve, with no evidence of sclerosis or stenosis.   8. The pulmonic valve was normal in structure. Pulmonic valve  regurgitation is trivial by color flow Doppler.   9. TR signal is inadequate for assessing pulmonary artery systolic  pressure.  10. The inferior vena cava is normal in size with greater than 50%  respiratory variability, suggesting right atrial pressure of 3 mmHg.    EKG:  EKG is ordered today.  The EKG ordered today demonstrates atrial flutter with variable AV block, 99 bpm, nonspecific ST-T changes  Recent Labs: 11/17/2022: B Natriuretic Peptide 152.5; Magnesium 2.1; TSH 3.029 12/22/2022: ALT 32; BUN 18; Creatinine, Ser 1.87; Hemoglobin 10.8; Platelets 122; Potassium 3.9; Sodium 135  Recent Lipid Panel    Component Value Date/Time   CHOL 134 11/16/2021 1716   CHOL 134 01/27/2018 1340   TRIG 71 11/16/2021 1716   HDL 32 (L) 11/16/2021 1716   HDL 80 01/27/2018 1340   CHOLHDL 4.2 11/16/2021 1716   VLDL 14 11/16/2021 1716   LDLCALC 88 11/16/2021 1716   LDLCALC 42 01/27/2018 1340    PHYSICAL EXAM:    VS:  BP 106/70 (BP Location: Left Arm, Patient Position: Sitting, Cuff Size: Normal)   Pulse 99   Ht 5\' 6"  (1.676 m)   Wt 169 lb (76.7 kg)   SpO2 97%   BMI 27.28 kg/m   BMI: Body mass index is 27.28 kg/m.  Physical Exam Vitals reviewed.  Constitutional:      Appearance: She is well-developed.  HENT:     Head: Normocephalic and atraumatic.  Eyes:     General:        Right eye: No discharge.        Left eye: No discharge.  Neck:     Vascular: No JVD.  Cardiovascular:  Rate and Rhythm: Normal rate. Rhythm irregularly irregular.     Pulses:          Posterior tibial pulses are 2+ on the right side and 2+ on the left side.     Heart sounds: Normal heart sounds, S1 normal and S2 normal. Heart sounds not distant. No midsystolic click and no opening snap. No murmur heard.    No friction rub.  Pulmonary:     Effort: Pulmonary effort  is normal. No respiratory distress.     Breath sounds: Normal breath sounds. No decreased breath sounds, wheezing or rales.  Chest:     Chest wall: No tenderness.  Abdominal:     General: There is no distension.  Musculoskeletal:     Cervical back: Normal range of motion.     Right lower leg: No edema.     Left lower leg: No edema.  Skin:    General: Skin is warm and dry.     Nails: There is no clubbing.  Neurological:     Mental Status: She is alert and oriented to person, place, and time.  Psychiatric:        Speech: Speech normal.        Behavior: Behavior normal.        Thought Content: Thought content normal.        Judgment: Judgment normal.     Wt Readings from Last 3 Encounters:  12/22/22 169 lb (76.7 kg)  12/16/22 174 lb (78.9 kg)  12/08/22 180 lb (81.6 kg)     ASSESSMENT & PLAN:   PAF/flutter: She remains in A-fib/flutter which has been persistent since 11/17/2022.  She indicates that she has been adherent to apixaban twice daily, and denies missing any doses.  The importance of not missing any doses was discussed in detail.  Schedule DCCV.  CHA2DS2-VASc at least 4, remains on apixaban 5 mg twice daily and does not meet reduced dosing criteria.  Recent CBC stable.  Renal function and electrolytes pending.  No falls since she was last seen.  However, if she has recurrent falls we may need to consider EP referral for consideration of Watchman.  HTN: Blood pressure is well-controlled in the office today.  She remains on Lopressor and spironolactone.  Follow-up labs pending.  Cirrhosis: Remains on furosemide and spironolactone with ongoing follow-up per hepatology.    Informed Consent   Shared Decision Making/Informed Consent{  The risks (stroke, cardiac arrhythmias rarely resulting in the need for a temporary or permanent pacemaker, skin irritation or burns and complications associated with conscious sedation including aspiration, arrhythmia, respiratory failure and  death), benefits (restoration of normal sinus rhythm) and alternatives of a direct current cardioversion were explained in detail to Ms. Swann and she agrees to proceed.          Disposition: F/u with Dr. Okey Dupre or an APP in 1 month.   Medication Adjustments/Labs and Tests Ordered: Current medicines are reviewed at length with the patient today.  Concerns regarding medicines are outlined above. Medication changes, Labs and Tests ordered today are summarized above and listed in the Patient Instructions accessible in Encounters.   Signed, Eula Listen, PA-C 12/22/2022 4:35 PM     [facility] [place] [address] [place], [place] [address] [phone]

## 2022-12-22 ENCOUNTER — Other Ambulatory Visit
Admission: RE | Admit: 2022-12-22 | Discharge: 2022-12-22 | Disposition: A | Discharge status: Home / Self Care | Payer: 59 | Source: Ambulatory Visit | Attending: Physician Assistant | Admitting: Physician Assistant

## 2022-12-22 ENCOUNTER — Ambulatory Visit: Payer: 59 | Attending: Physician Assistant | Admitting: Physician Assistant

## 2022-12-22 ENCOUNTER — Encounter: Payer: Self-pay | Admitting: Physician Assistant

## 2022-12-22 VITALS — BP 106/70 | HR 99 | Ht 66.0 in | Wt 169.0 lb

## 2022-12-22 DIAGNOSIS — D696 Thrombocytopenia, unspecified: Secondary | ICD-10-CM | POA: Diagnosis not present

## 2022-12-22 DIAGNOSIS — E876 Hypokalemia: Secondary | ICD-10-CM | POA: Diagnosis not present

## 2022-12-22 DIAGNOSIS — Z794 Long term (current) use of insulin: Secondary | ICD-10-CM | POA: Diagnosis not present

## 2022-12-22 DIAGNOSIS — E538 Deficiency of other specified B group vitamins: Secondary | ICD-10-CM | POA: Diagnosis not present

## 2022-12-22 DIAGNOSIS — I480 Paroxysmal atrial fibrillation: Secondary | ICD-10-CM

## 2022-12-22 DIAGNOSIS — E119 Type 2 diabetes mellitus without complications: Secondary | ICD-10-CM | POA: Insufficient documentation

## 2022-12-22 DIAGNOSIS — K7460 Unspecified cirrhosis of liver: Secondary | ICD-10-CM | POA: Diagnosis not present

## 2022-12-22 DIAGNOSIS — K7030 Alcoholic cirrhosis of liver without ascites: Secondary | ICD-10-CM | POA: Diagnosis not present

## 2022-12-22 DIAGNOSIS — E559 Vitamin D deficiency, unspecified: Secondary | ICD-10-CM | POA: Diagnosis not present

## 2022-12-22 DIAGNOSIS — I10 Essential (primary) hypertension: Secondary | ICD-10-CM | POA: Diagnosis not present

## 2022-12-22 LAB — CBC WITH DIFFERENTIAL/PLATELET
Abs Immature Granulocytes: 0.01 10*3/uL (ref 0.00–0.07)
Basophils Absolute: 0 10*3/uL (ref 0.0–0.1)
Basophils Relative: 0 %
Eosinophils Absolute: 0 10*3/uL (ref 0.0–0.5)
Eosinophils Relative: 0 %
HCT: 34.6 % — ABNORMAL LOW (ref 36.0–46.0)
Hemoglobin: 10.8 g/dL — ABNORMAL LOW (ref 12.0–15.0)
Immature Granulocytes: 0 %
Lymphocytes Relative: 28 %
Lymphs Abs: 1.4 10*3/uL (ref 0.7–4.0)
MCH: 30.8 pg (ref 26.0–34.0)
MCHC: 31.2 g/dL (ref 30.0–36.0)
MCV: 98.6 fL (ref 80.0–100.0)
Monocytes Absolute: 0.3 10*3/uL (ref 0.1–1.0)
Monocytes Relative: 6 %
Neutro Abs: 3.1 10*3/uL (ref 1.7–7.7)
Neutrophils Relative %: 66 %
Platelets: 122 10*3/uL — ABNORMAL LOW (ref 150–400)
RBC: 3.51 MIL/uL — ABNORMAL LOW (ref 3.87–5.11)
RDW: 16.8 % — ABNORMAL HIGH (ref 11.5–15.5)
WBC: 4.8 10*3/uL (ref 4.0–10.5)
nRBC: 0 % (ref 0.0–0.2)

## 2022-12-22 LAB — COMPREHENSIVE METABOLIC PANEL
ALT: 32 U/L (ref 0–44)
AST: 64 U/L — ABNORMAL HIGH (ref 15–41)
Albumin: 2.6 g/dL — ABNORMAL LOW (ref 3.5–5.0)
Alkaline Phosphatase: 77 U/L (ref 38–126)
Anion gap: 10 (ref 5–15)
BUN: 18 mg/dL (ref 8–23)
CO2: 26 mmol/L (ref 22–32)
Calcium: 8.3 mg/dL — ABNORMAL LOW (ref 8.9–10.3)
Chloride: 99 mmol/L (ref 98–111)
Creatinine, Ser: 1.87 mg/dL — ABNORMAL HIGH (ref 0.44–1.00)
GFR, Estimated: 29 mL/min — ABNORMAL LOW (ref >60)
Glucose, Bld: 196 mg/dL — ABNORMAL HIGH (ref 70–99)
Potassium: 3.9 mmol/L (ref 3.5–5.1)
Sodium: 135 mmol/L (ref 135–145)
Total Bilirubin: 1.5 mg/dL — ABNORMAL HIGH (ref 0.3–1.2)
Total Protein: 7.9 g/dL (ref 6.5–8.1)

## 2022-12-22 LAB — VITAMIN B12: Vitamin B-12: 791 pg/mL (ref 180–914)

## 2022-12-22 LAB — HEMOGLOBIN A1C
Hgb A1c MFr Bld: 5.2 % (ref 4.8–5.6)
Mean Plasma Glucose: 102.54 mg/dL

## 2022-12-22 LAB — VITAMIN D 25 HYDROXY (VIT D DEFICIENCY, FRACTURES): Vit D, 25-Hydroxy: >150 ng/mL — ABNORMAL HIGH (ref 30–100)

## 2022-12-22 LAB — FOLATE: Folate: 15.3 ng/mL (ref >5.9)

## 2022-12-22 NOTE — Patient Instructions (Signed)
Medication Instructions:  Your Physician recommend you continue on your current medication as directed.    *If you need a refill on your cardiac medications before your next appointment, please call your pharmacy*   Lab Work: None - pending last draw  If you have labs (blood work) drawn today and your tests are completely normal, you will receive your results only by: MyChart Message (if you have MyChart) OR A paper copy in the mail If you have any lab test that is abnormal or we need to change your treatment, we will call you to review the results.   Testing/Procedures:    Dear Adriana Miller  You are scheduled for a Cardioversion on Monday, September 9 with Dr. Debbe Odea.  Please arrive at the [facility] Entrance of [facility], [address], [place] [address] at 6:30 AM (This is 1 hour(s) prior to your procedure time).  Proceed to the Check-In Desk directly inside the entrance.  Procedure Parking: Use the entrance off of the [address] side of the hospital. Turn right upon entering and follow the driveway to parking that is directly in front of the [facility]. There is no valet parking available at this entrance, however there is an awning directly in front of the [facility] for drop off/ pick up for patients.   DIET:  Nothing to eat or drink after midnight except a sip of water with medications (see medication instructions below)  MEDICATION INSTRUCTIONS: Hold  furosemide (LASIX) 20 MG tablet spironolactone (ALDACTONE) 25 MG tablet  You may take these after your procedure  Continue taking your anticoagulant (blood thinner): Apixaban (Eliquis).  You will need to continue this after your procedure until you are told by your provider that it is safe to stop.    LABS: Already done  FYI:  For your safety, and to allow [place] to monitor your vital signs accurately during the surgery/procedure we request: If you have artificial  nails, gel coating, SNS etc, please have those removed prior to your surgery/procedure. Not having the nail coverings /polish removed may result in cancellation or delay of your surgery/procedure.  You must have a responsible person to drive you home and stay in the waiting area during your procedure. Failure to do so could result in cancellation.  Bring your insurance cards.  *Special Note: Every effort is made to have your procedure done on time. Occasionally there are emergencies that occur at the hospital that may cause delays. Please be patient if a delay does occur.     Follow-Up: At [facility], you and your health needs are our priority.  As part of our continuing mission to provide you with exceptional heart care, we have created designated Provider Care Teams.  These Care Teams include your primary Cardiologist (physician) and Advanced Practice Providers (APPs -  Physician Assistants and Nurse Practitioners) who all work together to provide you with the care you need, when you need it.  We recommend signing up for the patient portal called "MyChart".  Sign up information is provided on this After Visit Summary.  MyChart is used to connect with patients for Virtual Visits (Telemedicine).  Patients are able to view lab/test results, encounter notes, upcoming appointments, etc.  Non-urgent messages can be sent to your provider as well.   To learn more about what you can do with MyChart, go to [email or web address].    Your next appointment:   1 month(s)  Provider:  You may see Yvonne Kendall, MD or one of the following Advanced Practice Providers on your designated Care Team:   Eula Listen, [place]

## 2022-12-23 ENCOUNTER — Telehealth: Payer: Self-pay | Admitting: Physician Assistant

## 2022-12-23 NOTE — Telephone Encounter (Signed)
Pt stated she had an office visit yesterday but she lost the discharge summary paperwork that was given to her once she left and now she doesn't know what to do. She has procedure on Monday but she'd like a callback to see if she can come get another copy due to some of her medications being held. Please advise

## 2022-12-23 NOTE — Telephone Encounter (Signed)
Patient states her brother is going to stop by to pick up her discharge instructions from her appt yesterday. Advised patient that he may pick it up at the front desk. Reviewed medications that patients are to hold. Patient verbalized understanding.

## 2022-12-24 ENCOUNTER — Telehealth: Payer: Self-pay

## 2022-12-24 LAB — VITAMIN B1: Vitamin B1 (Thiamine): 74.9 nmol/L (ref 66.5–200.0)

## 2022-12-24 NOTE — Telephone Encounter (Signed)
Send message to [facility] for home health order

## 2022-12-27 ENCOUNTER — Ambulatory Visit
Admission: RE | Admit: 2022-12-27 | Discharge: 2022-12-27 | Disposition: A | Discharge status: Home / Self Care | Payer: 59 | Attending: Cardiology | Admitting: Cardiology

## 2022-12-27 ENCOUNTER — Telehealth: Payer: Self-pay

## 2022-12-27 ENCOUNTER — Ambulatory Visit: Payer: 59 | Admitting: General Practice

## 2022-12-27 ENCOUNTER — Encounter: Payer: Self-pay | Admitting: Cardiology

## 2022-12-27 ENCOUNTER — Other Ambulatory Visit: Payer: Self-pay

## 2022-12-27 ENCOUNTER — Encounter
Admission: RE | Disposition: A | Discharge status: Home / Self Care | Payer: Self-pay | Source: Home / Self Care | Attending: Cardiology

## 2022-12-27 DIAGNOSIS — N1832 Chronic kidney disease, stage 3b: Secondary | ICD-10-CM | POA: Diagnosis not present

## 2022-12-27 DIAGNOSIS — Z794 Long term (current) use of insulin: Secondary | ICD-10-CM | POA: Insufficient documentation

## 2022-12-27 DIAGNOSIS — Z8711 Personal history of peptic ulcer disease: Secondary | ICD-10-CM | POA: Insufficient documentation

## 2022-12-27 DIAGNOSIS — Z7984 Long term (current) use of oral hypoglycemic drugs: Secondary | ICD-10-CM | POA: Insufficient documentation

## 2022-12-27 DIAGNOSIS — I480 Paroxysmal atrial fibrillation: Secondary | ICD-10-CM | POA: Insufficient documentation

## 2022-12-27 DIAGNOSIS — Z8505 Personal history of malignant neoplasm of liver: Secondary | ICD-10-CM | POA: Insufficient documentation

## 2022-12-27 DIAGNOSIS — I5032 Chronic diastolic (congestive) heart failure: Secondary | ICD-10-CM | POA: Diagnosis not present

## 2022-12-27 DIAGNOSIS — Z8249 Family history of ischemic heart disease and other diseases of the circulatory system: Secondary | ICD-10-CM | POA: Insufficient documentation

## 2022-12-27 DIAGNOSIS — I129 Hypertensive chronic kidney disease with stage 1 through stage 4 chronic kidney disease, or unspecified chronic kidney disease: Secondary | ICD-10-CM | POA: Diagnosis not present

## 2022-12-27 DIAGNOSIS — G40909 Epilepsy, unspecified, not intractable, without status epilepticus: Secondary | ICD-10-CM | POA: Diagnosis not present

## 2022-12-27 DIAGNOSIS — N182 Chronic kidney disease, stage 2 (mild): Secondary | ICD-10-CM | POA: Diagnosis not present

## 2022-12-27 DIAGNOSIS — K219 Gastro-esophageal reflux disease without esophagitis: Secondary | ICD-10-CM | POA: Diagnosis not present

## 2022-12-27 DIAGNOSIS — I4892 Unspecified atrial flutter: Secondary | ICD-10-CM | POA: Insufficient documentation

## 2022-12-27 DIAGNOSIS — K7030 Alcoholic cirrhosis of liver without ascites: Secondary | ICD-10-CM | POA: Insufficient documentation

## 2022-12-27 DIAGNOSIS — E1122 Type 2 diabetes mellitus with diabetic chronic kidney disease: Secondary | ICD-10-CM | POA: Diagnosis not present

## 2022-12-27 DIAGNOSIS — E785 Hyperlipidemia, unspecified: Secondary | ICD-10-CM | POA: Insufficient documentation

## 2022-12-27 DIAGNOSIS — Z7901 Long term (current) use of anticoagulants: Secondary | ICD-10-CM | POA: Insufficient documentation

## 2022-12-27 DIAGNOSIS — Z79899 Other long term (current) drug therapy: Secondary | ICD-10-CM | POA: Insufficient documentation

## 2022-12-27 DIAGNOSIS — I130 Hypertensive heart and chronic kidney disease with heart failure and stage 1 through stage 4 chronic kidney disease, or unspecified chronic kidney disease: Secondary | ICD-10-CM | POA: Diagnosis not present

## 2022-12-27 HISTORY — PX: CARDIOVERSION: SHX1299

## 2022-12-27 LAB — GLUCOSE, CAPILLARY: Glucose-Capillary: 157 mg/dL — ABNORMAL HIGH (ref 70–99)

## 2022-12-27 SURGERY — CARDIOVERSION
Anesthesia: General

## 2022-12-27 MED ORDER — PROPOFOL 10 MG/ML IV BOLUS
INTRAVENOUS | Status: DC | PRN
  Administered 2022-12-27: 50 mg via INTRAVENOUS

## 2022-12-27 MED ORDER — SODIUM CHLORIDE 0.9 % IV SOLN: INTRAVENOUS | Status: DC

## 2022-12-27 NOTE — Anesthesia Preprocedure Evaluation (Signed)
Anesthesia Evaluation  Patient identified by MRN, date of birth, ID band Patient awake    Reviewed: Allergy & Precautions, NPO status , Patient's Chart, lab work & pertinent test results  History of Anesthesia Complications Negative for: history of anesthetic complications  Airway Mallampati: III  TM Distance: <3 FB Neck ROM: full    Dental  (+) Chipped, Poor Dentition, Missing, Dental Advidsory Given   Pulmonary neg pulmonary ROS, neg shortness of breath   Pulmonary exam normal        Cardiovascular Exercise Tolerance: Good hypertension, (-) angina (-) Past MI and (-) DOE + dysrhythmias Atrial Fibrillation  Rhythm:irregular Rate:Normal     Neuro/Psych Seizures -,  PSYCHIATRIC DISORDERS         GI/Hepatic PUD,GERD  Controlled,,(+) Cirrhosis         Endo/Other  diabetes, Type 2    Renal/GU Renal disease  negative genitourinary   Musculoskeletal   Abdominal   Peds  Hematology negative hematology ROS (+)   Anesthesia Other Findings Patient has cardiac clearance for this procedure.   Past Medical History: No date: Alcoholic cirrhosis of liver without ascites (HCC) 04/05/2017: B12 deficiency No date: Blood in stool No date: Cerebral aneurysm No date: Chronic kidney disease No date: Diabetes mellitus without complication (HCC)     Comment:  type 2 No date: Gastric erosion with bleeding No date: Hepatocellular carcinoma (HCC) No date: Hyperlipidemia No date: Hypertension 02/08/2019: Lower extremity cellulitis No date: Paroxysmal A-fib (HCC) No date: Seizure disorder [facility])  Past Surgical History: 1991: CEREBRAL ANEURYSM REPAIR 02/12/2019: COLONOSCOPY WITH PROPOFOL; N/A     Comment:  Procedure: COLONOSCOPY WITH PROPOFOL;  Surgeon: Toney Reil, MD;  Location: [facility] ENDOSCOPY;  Service:               Gastroenterology;  Laterality: N/A; 02/12/2019: ESOPHAGOGASTRODUODENOSCOPY (EGD) WITH  PROPOFOL; N/A     Comment:  Procedure: ESOPHAGOGASTRODUODENOSCOPY (EGD) WITH               PROPOFOL;  Surgeon: Toney Reil, MD;  Location:               [facility] ENDOSCOPY;  Service: Gastroenterology;  Laterality:               N/A; 09/21/2019: IR ANGIOGRAM SELECTIVE EACH ADDITIONAL VESSEL 09/21/2019: IR ANGIOGRAM SELECTIVE EACH ADDITIONAL VESSEL 09/21/2019: IR ANGIOGRAM SELECTIVE EACH ADDITIONAL VESSEL 09/21/2019: IR ANGIOGRAM SELECTIVE EACH ADDITIONAL VESSEL 10/02/2019: IR ANGIOGRAM SELECTIVE EACH ADDITIONAL VESSEL 09/21/2019: IR ANGIOGRAM VISCERAL SELECTIVE 09/21/2019: IR ANGIOGRAM VISCERAL SELECTIVE 10/02/2019: IR ANGIOGRAM VISCERAL SELECTIVE 10/02/2019: IR EMBO TUMOR ORGAN ISCHEMIA INFARCT INC GUIDE ROADMAPPING 10/02/2019: IR EMBO TUMOR ORGAN ISCHEMIA INFARCT INC GUIDE ROADMAPPING 08/28/2019: IR RADIOLOGIST EVAL & MGMT 01/30/2020: IR RADIOLOGIST EVAL & MGMT 04/30/2020: IR RADIOLOGIST EVAL & MGMT 07/30/2020: IR RADIOLOGIST EVAL & MGMT 10/28/2020: IR RADIOLOGIST EVAL & MGMT 02/24/2021: IR RADIOLOGIST EVAL & MGMT 06/08/2021: IR RADIOLOGIST EVAL & MGMT 09/21/2019: IR US GUIDE VASC ACCESS LEFT 10/02/2019: IR US GUIDE VASC ACCESS LEFT  BMI    Body Mass Index: 30.49 kg/m      Reproductive/Obstetrics negative OB ROS                              Anesthesia Physical Anesthesia Plan  ASA: 3  Anesthesia Plan: General ETT and General   Post-op Pain Management: Regional block*   Induction:  Intravenous  PONV Risk Score and Plan: 4 or greater and TIVA, Midazolam and Ondansetron  Airway Management Planned: Oral ETT  Additional Equipment:   Intra-op Plan:   Post-operative Plan: Extubation in OR  Informed Consent: I have reviewed the patients History and Physical, chart, labs and discussed the procedure including the risks, benefits and alternatives for the proposed anesthesia with the patient or authorized representative who has indicated his/her understanding and  acceptance.     Dental Advisory Given  Plan Discussed with: Anesthesiologist, CRNA and Surgeon  Anesthesia Plan Comments: (Patient consented for risks of anesthesia including but not limited to:  - adverse reactions to medications - damage to eyes, teeth, lips or other oral mucosa - nerve damage due to positioning  - sore throat or hoarseness - Damage to heart, brain, nerves, lungs, other parts of body or loss of life  Patient voiced understanding.)         Anesthesia Quick Evaluation

## 2022-12-27 NOTE — Telephone Encounter (Signed)
Gave verbal order to [facility] for home health to [place] [facility] [phone] only for physical therapy as per lauren its ok

## 2022-12-27 NOTE — Telephone Encounter (Signed)
Faxed wheelchair order to [facility]

## 2022-12-27 NOTE — Group Note (Deleted)

## 2022-12-27 NOTE — Transfer of Care (Signed)
Immediate Anesthesia Transfer of Care Note  Patient: Adriana Miller  Procedure(s) Performed: CARDIOVERSION  Patient Location: PACU  Anesthesia Type:General  Level of Consciousness: awake, alert , and oriented  Airway & Oxygen Therapy: Patient Spontanous Breathing and Patient connected to nasal cannula oxygen  Post-op Assessment: Report given to RN and Post -op Vital signs reviewed and stable  Post vital signs: Reviewed and stable  Last Vitals:  Vitals Value Taken Time  BP 128/77 12/27/22 0742  Temp    Pulse 76 12/27/22 0743  Resp 21 12/27/22 0743  SpO2 99 % 12/27/22 0743  Vitals shown include unfiled device data.  Last Pain:  Vitals:   12/27/22 0659  TempSrc: Oral  PainSc: 0-No pain         Complications: No notable events documented.

## 2022-12-27 NOTE — Anesthesia Postprocedure Evaluation (Signed)
Anesthesia Post Note  Patient: Adriana Miller  Procedure(s) Performed: CARDIOVERSION  Patient location during evaluation: [facility] Recovery Anesthesia Type: General Level of consciousness: awake and alert Pain management: pain level controlled Vital Signs Assessment: post-procedure vital signs reviewed and stable Respiratory status: spontaneous breathing, nonlabored ventilation, respiratory function stable and patient connected to nasal cannula oxygen Cardiovascular status: blood pressure returned to baseline and stable Postop Assessment: no apparent nausea or vomiting Anesthetic complications: no  No notable events documented.   Last Vitals:  Vitals:   12/27/22 0742 12/27/22 0743  BP: 128/77   Pulse: 76 75  Resp: (!) 24 (!) 23  Temp:    SpO2: 100% 100%    Last Pain:  Vitals:   12/27/22 0659  TempSrc: Oral  PainSc: 0-No pain                 Stephanie Coup

## 2022-12-27 NOTE — Interval H&P Note (Signed)
History and Physical Interval Note:  12/27/2022 7:42 AM  Adriana Miller  has presented today for surgery, with the diagnosis of atrial flutter.  The various methods of treatment have been discussed with the patient and family. After consideration of risks, benefits and other options for treatment, the patient has consented to  Procedure(s): CARDIOVERSION (N/A) as a surgical intervention.  The patient's history has been reviewed, patient examined, no change in status, stable for surgery.  I have reviewed the patient's chart and labs.  Questions were answered to the patient's satisfaction.     Arlys John Agbor-Etang

## 2022-12-27 NOTE — Procedures (Addendum)
Cardioversion procedure note For atrial flutter.  Procedure Details:  Consent: Risks of procedure as well as the alternatives and risks of each were explained to the (patient/caregiver).  Consent for procedure obtained.  Time Out: Verified patient identification, verified procedure, site/side was marked, verified correct patient position, special equipment/implants available, medications/allergies/relevent history reviewed, required imaging and test results available.  Performed  Patient placed on cardiac monitor, pulse oximetry, supplemental oxygen as necessary.   Sedation given: propofol IV per anesthesia team Pacer pads placed anterior and posterior chest.   Cardioverted 1 time(s).   Cardioverted at  150J. Synchronized biphasic Converted to NSR   Evaluation: Findings: Post procedure EKG shows: NSR Complications: None Patient did tolerate procedure well.  Recommendations. Cont lopressor, eliquis. F/u with in the office. Consider EP if arrhythmia returns.   Time Spent Directly with the Patient:  25 minutes   Debbe Odea, M.D.

## 2022-12-28 DIAGNOSIS — N179 Acute kidney failure, unspecified: Secondary | ICD-10-CM | POA: Diagnosis not present

## 2022-12-28 DIAGNOSIS — I5032 Chronic diastolic (congestive) heart failure: Secondary | ICD-10-CM | POA: Diagnosis not present

## 2022-12-28 DIAGNOSIS — M109 Gout, unspecified: Secondary | ICD-10-CM | POA: Diagnosis not present

## 2022-12-28 DIAGNOSIS — E1122 Type 2 diabetes mellitus with diabetic chronic kidney disease: Secondary | ICD-10-CM | POA: Diagnosis not present

## 2022-12-28 DIAGNOSIS — Z794 Long term (current) use of insulin: Secondary | ICD-10-CM | POA: Diagnosis not present

## 2022-12-28 DIAGNOSIS — Z7901 Long term (current) use of anticoagulants: Secondary | ICD-10-CM | POA: Diagnosis not present

## 2022-12-28 DIAGNOSIS — N1831 Chronic kidney disease, stage 3a: Secondary | ICD-10-CM | POA: Diagnosis not present

## 2022-12-28 DIAGNOSIS — E538 Deficiency of other specified B group vitamins: Secondary | ICD-10-CM | POA: Diagnosis not present

## 2022-12-28 DIAGNOSIS — Z7984 Long term (current) use of oral hypoglycemic drugs: Secondary | ICD-10-CM | POA: Diagnosis not present

## 2022-12-28 DIAGNOSIS — Z8505 Personal history of malignant neoplasm of liver: Secondary | ICD-10-CM | POA: Diagnosis not present

## 2022-12-28 DIAGNOSIS — D696 Thrombocytopenia, unspecified: Secondary | ICD-10-CM | POA: Diagnosis not present

## 2022-12-28 DIAGNOSIS — I130 Hypertensive heart and chronic kidney disease with heart failure and stage 1 through stage 4 chronic kidney disease, or unspecified chronic kidney disease: Secondary | ICD-10-CM | POA: Diagnosis not present

## 2022-12-28 DIAGNOSIS — Z87440 Personal history of urinary (tract) infections: Secondary | ICD-10-CM | POA: Diagnosis not present

## 2022-12-28 DIAGNOSIS — E782 Mixed hyperlipidemia: Secondary | ICD-10-CM | POA: Diagnosis not present

## 2022-12-28 DIAGNOSIS — I480 Paroxysmal atrial fibrillation: Secondary | ICD-10-CM | POA: Diagnosis not present

## 2022-12-29 ENCOUNTER — Telehealth: Payer: Self-pay

## 2022-12-29 NOTE — Telephone Encounter (Signed)
Pt was notified for labs result  °

## 2022-12-29 NOTE — Telephone Encounter (Signed)
-----   Message from Carlean Jews sent at 12/29/2022 11:16 AM EDT ----- Vit D now high and can decrease supplement. Liver and kidney labs continue to be abnormal which is chronic finding for her, renal function worse than a few months ago however it is improving since last ED visit. Continue to stay well hydrated. Hemoglobin and platelets also a little low but overall appear stable for her. A1c in normal range and continue to stay off of glimepiride and monitor on insulin. May be able to decrease insulin in future as well if still having any lows.

## 2022-12-30 ENCOUNTER — Telehealth: Payer: Self-pay

## 2022-12-30 DIAGNOSIS — E538 Deficiency of other specified B group vitamins: Secondary | ICD-10-CM | POA: Diagnosis not present

## 2022-12-30 DIAGNOSIS — Z8505 Personal history of malignant neoplasm of liver: Secondary | ICD-10-CM | POA: Diagnosis not present

## 2022-12-30 DIAGNOSIS — D696 Thrombocytopenia, unspecified: Secondary | ICD-10-CM | POA: Diagnosis not present

## 2022-12-30 DIAGNOSIS — I5032 Chronic diastolic (congestive) heart failure: Secondary | ICD-10-CM | POA: Diagnosis not present

## 2022-12-30 DIAGNOSIS — Z87440 Personal history of urinary (tract) infections: Secondary | ICD-10-CM | POA: Diagnosis not present

## 2022-12-30 DIAGNOSIS — Z794 Long term (current) use of insulin: Secondary | ICD-10-CM | POA: Diagnosis not present

## 2022-12-30 DIAGNOSIS — Z7901 Long term (current) use of anticoagulants: Secondary | ICD-10-CM | POA: Diagnosis not present

## 2022-12-30 DIAGNOSIS — M109 Gout, unspecified: Secondary | ICD-10-CM | POA: Diagnosis not present

## 2022-12-30 DIAGNOSIS — I480 Paroxysmal atrial fibrillation: Secondary | ICD-10-CM | POA: Diagnosis not present

## 2022-12-30 DIAGNOSIS — I130 Hypertensive heart and chronic kidney disease with heart failure and stage 1 through stage 4 chronic kidney disease, or unspecified chronic kidney disease: Secondary | ICD-10-CM | POA: Diagnosis not present

## 2022-12-30 DIAGNOSIS — E782 Mixed hyperlipidemia: Secondary | ICD-10-CM | POA: Diagnosis not present

## 2022-12-30 DIAGNOSIS — E1122 Type 2 diabetes mellitus with diabetic chronic kidney disease: Secondary | ICD-10-CM | POA: Diagnosis not present

## 2022-12-30 DIAGNOSIS — N1831 Chronic kidney disease, stage 3a: Secondary | ICD-10-CM | POA: Diagnosis not present

## 2022-12-30 DIAGNOSIS — N179 Acute kidney failure, unspecified: Secondary | ICD-10-CM | POA: Diagnosis not present

## 2022-12-30 DIAGNOSIS — Z7984 Long term (current) use of oral hypoglycemic drugs: Secondary | ICD-10-CM | POA: Diagnosis not present

## 2022-12-30 NOTE — Telephone Encounter (Signed)
Faxed [facility] additional paper work

## 2022-12-31 ENCOUNTER — Other Ambulatory Visit: Payer: Self-pay

## 2022-12-31 ENCOUNTER — Telehealth: Payer: Self-pay | Admitting: Internal Medicine

## 2022-12-31 DIAGNOSIS — I480 Paroxysmal atrial fibrillation: Secondary | ICD-10-CM

## 2022-12-31 DIAGNOSIS — G4700 Insomnia, unspecified: Secondary | ICD-10-CM

## 2022-12-31 DIAGNOSIS — M10222 Drug-induced gout, left elbow: Secondary | ICD-10-CM

## 2022-12-31 DIAGNOSIS — E1129 Type 2 diabetes mellitus with other diabetic kidney complication: Secondary | ICD-10-CM

## 2022-12-31 DIAGNOSIS — F320 Major depressive disorder, single episode, mild: Secondary | ICD-10-CM

## 2022-12-31 DIAGNOSIS — E1165 Type 2 diabetes mellitus with hyperglycemia: Secondary | ICD-10-CM

## 2022-12-31 DIAGNOSIS — G2581 Restless legs syndrome: Secondary | ICD-10-CM

## 2022-12-31 DIAGNOSIS — K7031 Alcoholic cirrhosis of liver with ascites: Secondary | ICD-10-CM

## 2022-12-31 MED ORDER — MIRTAZAPINE 7.5 MG PO TABS: ORAL_TABLET | ORAL | 3 refills | Status: DC

## 2022-12-31 MED ORDER — CALCIUM CARB-CHOLECALCIFEROL 500-5 MG-MCG PO TABS: ORAL_TABLET | ORAL | 5 refills | Status: DC

## 2022-12-31 MED ORDER — APIXABAN 5 MG PO TABS: 5.0000 mg | ORAL_TABLET | Freq: Two times a day (BID) | ORAL | 1 refills | Status: DC

## 2022-12-31 MED ORDER — TRAZODONE HCL 50 MG PO TABS: ORAL_TABLET | ORAL | 3 refills | Status: DC

## 2022-12-31 MED ORDER — ALLOPURINOL 100 MG PO TABS: 100.0000 mg | ORAL_TABLET | Freq: Every day | ORAL | 2 refills | Status: DC

## 2022-12-31 MED ORDER — VITAMIN B-1 100 MG PO TABS: 100.0000 mg | ORAL_TABLET | Freq: Every day | ORAL | 1 refills | Status: DC

## 2022-12-31 MED ORDER — BD PEN NEEDLE NANO 2ND GEN 32G X 4 MM MISC: 3 refills | Status: DC

## 2022-12-31 MED ORDER — FOLIC ACID 1 MG PO TABS: 1.0000 mg | ORAL_TABLET | Freq: Every day | ORAL | 0 refills | Status: DC

## 2022-12-31 MED ORDER — ESCITALOPRAM OXALATE 10 MG PO TABS: ORAL_TABLET | ORAL | 3 refills | Status: DC

## 2022-12-31 MED ORDER — SPIRONOLACTONE 25 MG PO TABS: 25.0000 mg | ORAL_TABLET | Freq: Every day | ORAL | 2 refills | Status: DC

## 2022-12-31 MED ORDER — TOUJEO SOLOSTAR 300 UNIT/ML ~~LOC~~ SOPN: 10.0000 [IU] | PEN_INJECTOR | Freq: Every day | SUBCUTANEOUS | 3 refills | Status: DC

## 2022-12-31 NOTE — Telephone Encounter (Signed)
Spoke with the patient, she confirmed that [facility] is her new pharmacy and [facility] is her pharmacy when she needs medication right away.

## 2022-12-31 NOTE — Telephone Encounter (Signed)
*  STAT* If patient is at the pharmacy, call can be transferred to refill team.   1. Which medications need to be refilled? (please list name of each medication and dose if known) ELIQUIS 5 MG TABS tablet    2. Would you like to learn more about the convenience, safety, & potential cost savings by using the [facility]? No      3. Are you open to using the [facility] (Type [facility]. No ).   4. Which pharmacy/location (including street and city if local pharmacy) is medication to be sent to? [facility] - [address], [place] - [address]    5. Do they need a 30 day or 90 day supply? 90

## 2022-12-31 NOTE — Telephone Encounter (Signed)
Eliquis 5mg  refill request received. Patient is 67 years old, weight-77.1kg, Crea-1.87 on 12/22/22, Diagnosis-Afib, and last seen by Eula Listen on 12/22/22. Dose is appropriate based on dosing criteria. Will send in refill to requested pharmacy.

## 2022-12-31 NOTE — Telephone Encounter (Signed)
Refill request

## 2023-01-03 DIAGNOSIS — Z794 Long term (current) use of insulin: Secondary | ICD-10-CM | POA: Diagnosis not present

## 2023-01-03 DIAGNOSIS — E782 Mixed hyperlipidemia: Secondary | ICD-10-CM | POA: Diagnosis not present

## 2023-01-03 DIAGNOSIS — I480 Paroxysmal atrial fibrillation: Secondary | ICD-10-CM | POA: Diagnosis not present

## 2023-01-03 DIAGNOSIS — N179 Acute kidney failure, unspecified: Secondary | ICD-10-CM | POA: Diagnosis not present

## 2023-01-03 DIAGNOSIS — I5032 Chronic diastolic (congestive) heart failure: Secondary | ICD-10-CM | POA: Diagnosis not present

## 2023-01-03 DIAGNOSIS — N1831 Chronic kidney disease, stage 3a: Secondary | ICD-10-CM | POA: Diagnosis not present

## 2023-01-03 DIAGNOSIS — I130 Hypertensive heart and chronic kidney disease with heart failure and stage 1 through stage 4 chronic kidney disease, or unspecified chronic kidney disease: Secondary | ICD-10-CM | POA: Diagnosis not present

## 2023-01-03 DIAGNOSIS — Z7984 Long term (current) use of oral hypoglycemic drugs: Secondary | ICD-10-CM | POA: Diagnosis not present

## 2023-01-03 DIAGNOSIS — Z7901 Long term (current) use of anticoagulants: Secondary | ICD-10-CM | POA: Diagnosis not present

## 2023-01-03 DIAGNOSIS — E1122 Type 2 diabetes mellitus with diabetic chronic kidney disease: Secondary | ICD-10-CM | POA: Diagnosis not present

## 2023-01-03 DIAGNOSIS — Z8505 Personal history of malignant neoplasm of liver: Secondary | ICD-10-CM | POA: Diagnosis not present

## 2023-01-03 DIAGNOSIS — D696 Thrombocytopenia, unspecified: Secondary | ICD-10-CM | POA: Diagnosis not present

## 2023-01-03 DIAGNOSIS — E538 Deficiency of other specified B group vitamins: Secondary | ICD-10-CM | POA: Diagnosis not present

## 2023-01-03 DIAGNOSIS — M109 Gout, unspecified: Secondary | ICD-10-CM | POA: Diagnosis not present

## 2023-01-03 DIAGNOSIS — Z87440 Personal history of urinary (tract) infections: Secondary | ICD-10-CM | POA: Diagnosis not present

## 2023-01-04 ENCOUNTER — Other Ambulatory Visit: Payer: Self-pay

## 2023-01-04 MED ORDER — FUROSEMIDE 20 MG PO TABS: ORAL_TABLET | ORAL | 0 refills | Status: DC

## 2023-01-04 MED ORDER — VITAMIN B-1 100 MG PO TABS: 100.0000 mg | ORAL_TABLET | Freq: Every day | ORAL | 1 refills | Status: AC

## 2023-01-05 ENCOUNTER — Ambulatory Visit: Payer: Self-pay

## 2023-01-05 NOTE — Patient Instructions (Signed)
Visit Information  Thank you for taking time to visit with me today. Please don't hesitate to contact me if I can be of assistance to you.   Following are the goals we discussed today:   Goals Addressed             This Visit's Progress    Management of chronic health conditions       Interventions Today    Flowsheet Row Most Recent Value  Chronic Disease   Chronic disease during today's visit Diabetes, Atrial Fibrillation (AFib), Other  [difficulty swallowing]  General Interventions   General Interventions Discussed/Reviewed General Interventions Reviewed, Doctor Visits  [evaluation of current treatment plan for DM, Atrial fibrillation and difficulty swallowing and patients adherence to plan as established by provider. Assessed for blood sugar readings, atrial fibrillation symptoms.]  Doctor Visits Discussed/Reviewed Doctor Visits Reviewed  [Patient informed that new symptoms such as difficulty with swallowing should be reported to her provider.   Message sent to primary care provider regarding new symptom of difficulty swallowing and frequent low blood sugar readings.]  Exercise Interventions   Exercise Discussed/Reviewed Physical Activity  [assessed  for patient current activity level. Discussed alcohol effect on blood sugar and causing hypoglycemia]  Education Interventions   Education Provided Provided Education  [Reviewed Rule of 15 treatment for hypoglycemia with patient.]  Provided Verbal Education On When to see the doctor, Blood Sugar Monitoring  Nutrition Interventions   Nutrition Discussed/Reviewed Nutrition Reviewed  Pharmacy Interventions   Pharmacy Dicussed/Reviewed Pharmacy Topics Reviewed  Safety Interventions   Safety Discussed/Reviewed Safety Reviewed              Our next appointment is by telephone on 02/02/23 at 2 pm  Please call the care guide team at [phone] if you need to cancel or reschedule your appointment.   If you are experiencing a  Mental Health or Behavioral Health Crisis or need someone to talk to, please call the Suicide and Crisis Lifeline: 988 call 1-800-273-TALK (toll free, 24 hour hotline)  Patient verbalizes understanding of instructions and care plan provided today and agrees to view in MyChart. Active MyChart status and patient understanding of how to access instructions and care plan via MyChart confirmed with patient.     George Ina RN,BSN,CCM [facility] Care Coordination [phone] direct line

## 2023-01-05 NOTE — Patient Outreach (Signed)
Care Coordination   Follow Up Visit Note   01/05/2023 Name: Adriana Miller MRN: [id number] DOB: 1955-08-07  Adriana Miller is a 67 y.o. year old female who sees McDonough, Salomon Fick, PA-C for primary care. I spoke with  Adriana Miller by phone today.  What matters to the patients health and wellness today?  Patient reports having coughing, sore throat and difficulty swallowing x 2 weeks.  She states the sore throat has gotten better however still has cough and difficulty swallowing.  Patient states she has difficulty taking her medications and difficulty with her food.  She states eating smaller bites of food has helped.   Patient states she takes her medications as prescribed and her aide fills her pill box .   She reports  her blood sugars range from 100-120 with daily low blood sugars readings of 40's to 60's.  Patient reports having transportation to appointments with [employer] transportation or assistance from her brother.  She reports having aide assistance 7 days per week 2 1/2 hours per day, receives meals on wheels and is receiving home health PT 2 x per week.  She states she is frequently fatigued and has SOB with activity.  Patient reports having 1 beer this week.    Goals Addressed             This Visit's Progress    Management of chronic health conditions       Interventions Today    Flowsheet Row Most Recent Value  Chronic Disease   Chronic disease during today's visit Diabetes, Atrial Fibrillation (AFib), Other  [difficulty swallowing]  General Interventions   General Interventions Discussed/Reviewed General Interventions Reviewed, Doctor Visits  [evaluation of current treatment plan for DM, Atrial fibrillation and difficulty swallowing and patients adherence to plan as established by provider. Assessed for blood sugar readings, atrial fibrillation symptoms.]  Doctor Visits Discussed/Reviewed Doctor Visits Reviewed  [Patient informed that new symptoms such as difficulty with  swallowing should be reported to her provider.   Message sent to primary care provider regarding new symptom of difficulty swallowing and frequent low blood sugar readings.]  Exercise Interventions   Exercise Discussed/Reviewed Physical Activity  [assessed  for patient current activity level. Discussed alcohol effect on blood sugar and causing hypoglycemia]  Education Interventions   Education Provided Provided Education  [Reviewed Rule of 15 treatment for hypoglycemia with patient.]  Provided Verbal Education On When to see the doctor, Blood Sugar Monitoring  Nutrition Interventions   Nutrition Discussed/Reviewed Nutrition Reviewed  Pharmacy Interventions   Pharmacy Dicussed/Reviewed Pharmacy Topics Reviewed  Safety Interventions   Safety Discussed/Reviewed Safety Reviewed              SDOH assessments and interventions completed:  No     Care Coordination Interventions:  Yes, provided   Follow up plan: Follow up call scheduled for 02/02/23    Encounter Outcome:  Patient Visit Completed   George Ina RN,BSN,CCM [facility] Care Coordination [phone] direct line

## 2023-01-06 DIAGNOSIS — N179 Acute kidney failure, unspecified: Secondary | ICD-10-CM | POA: Diagnosis not present

## 2023-01-06 DIAGNOSIS — M109 Gout, unspecified: Secondary | ICD-10-CM | POA: Diagnosis not present

## 2023-01-06 DIAGNOSIS — Z7984 Long term (current) use of oral hypoglycemic drugs: Secondary | ICD-10-CM | POA: Diagnosis not present

## 2023-01-06 DIAGNOSIS — E1122 Type 2 diabetes mellitus with diabetic chronic kidney disease: Secondary | ICD-10-CM | POA: Diagnosis not present

## 2023-01-06 DIAGNOSIS — Z8505 Personal history of malignant neoplasm of liver: Secondary | ICD-10-CM | POA: Diagnosis not present

## 2023-01-06 DIAGNOSIS — I5032 Chronic diastolic (congestive) heart failure: Secondary | ICD-10-CM | POA: Diagnosis not present

## 2023-01-06 DIAGNOSIS — I480 Paroxysmal atrial fibrillation: Secondary | ICD-10-CM | POA: Diagnosis not present

## 2023-01-06 DIAGNOSIS — E782 Mixed hyperlipidemia: Secondary | ICD-10-CM | POA: Diagnosis not present

## 2023-01-06 DIAGNOSIS — Z794 Long term (current) use of insulin: Secondary | ICD-10-CM | POA: Diagnosis not present

## 2023-01-06 DIAGNOSIS — I130 Hypertensive heart and chronic kidney disease with heart failure and stage 1 through stage 4 chronic kidney disease, or unspecified chronic kidney disease: Secondary | ICD-10-CM | POA: Diagnosis not present

## 2023-01-06 DIAGNOSIS — D696 Thrombocytopenia, unspecified: Secondary | ICD-10-CM | POA: Diagnosis not present

## 2023-01-06 DIAGNOSIS — Z7901 Long term (current) use of anticoagulants: Secondary | ICD-10-CM | POA: Diagnosis not present

## 2023-01-06 DIAGNOSIS — N1831 Chronic kidney disease, stage 3a: Secondary | ICD-10-CM | POA: Diagnosis not present

## 2023-01-06 DIAGNOSIS — Z87440 Personal history of urinary (tract) infections: Secondary | ICD-10-CM | POA: Diagnosis not present

## 2023-01-06 DIAGNOSIS — E538 Deficiency of other specified B group vitamins: Secondary | ICD-10-CM | POA: Diagnosis not present

## 2023-01-10 DIAGNOSIS — N179 Acute kidney failure, unspecified: Secondary | ICD-10-CM | POA: Diagnosis not present

## 2023-01-10 DIAGNOSIS — Z7901 Long term (current) use of anticoagulants: Secondary | ICD-10-CM | POA: Diagnosis not present

## 2023-01-10 DIAGNOSIS — Z7984 Long term (current) use of oral hypoglycemic drugs: Secondary | ICD-10-CM | POA: Diagnosis not present

## 2023-01-10 DIAGNOSIS — I5032 Chronic diastolic (congestive) heart failure: Secondary | ICD-10-CM | POA: Diagnosis not present

## 2023-01-10 DIAGNOSIS — E538 Deficiency of other specified B group vitamins: Secondary | ICD-10-CM | POA: Diagnosis not present

## 2023-01-10 DIAGNOSIS — N1831 Chronic kidney disease, stage 3a: Secondary | ICD-10-CM | POA: Diagnosis not present

## 2023-01-10 DIAGNOSIS — Z794 Long term (current) use of insulin: Secondary | ICD-10-CM | POA: Diagnosis not present

## 2023-01-10 DIAGNOSIS — M109 Gout, unspecified: Secondary | ICD-10-CM | POA: Diagnosis not present

## 2023-01-10 DIAGNOSIS — I130 Hypertensive heart and chronic kidney disease with heart failure and stage 1 through stage 4 chronic kidney disease, or unspecified chronic kidney disease: Secondary | ICD-10-CM | POA: Diagnosis not present

## 2023-01-10 DIAGNOSIS — Z8505 Personal history of malignant neoplasm of liver: Secondary | ICD-10-CM | POA: Diagnosis not present

## 2023-01-10 DIAGNOSIS — Z87440 Personal history of urinary (tract) infections: Secondary | ICD-10-CM | POA: Diagnosis not present

## 2023-01-10 DIAGNOSIS — D696 Thrombocytopenia, unspecified: Secondary | ICD-10-CM | POA: Diagnosis not present

## 2023-01-10 DIAGNOSIS — E1122 Type 2 diabetes mellitus with diabetic chronic kidney disease: Secondary | ICD-10-CM | POA: Diagnosis not present

## 2023-01-10 DIAGNOSIS — E782 Mixed hyperlipidemia: Secondary | ICD-10-CM | POA: Diagnosis not present

## 2023-01-10 DIAGNOSIS — I480 Paroxysmal atrial fibrillation: Secondary | ICD-10-CM | POA: Diagnosis not present

## 2023-01-12 DIAGNOSIS — E538 Deficiency of other specified B group vitamins: Secondary | ICD-10-CM | POA: Diagnosis not present

## 2023-01-12 DIAGNOSIS — Z87440 Personal history of urinary (tract) infections: Secondary | ICD-10-CM | POA: Diagnosis not present

## 2023-01-12 DIAGNOSIS — Z7901 Long term (current) use of anticoagulants: Secondary | ICD-10-CM | POA: Diagnosis not present

## 2023-01-12 DIAGNOSIS — Z794 Long term (current) use of insulin: Secondary | ICD-10-CM | POA: Diagnosis not present

## 2023-01-12 DIAGNOSIS — I5032 Chronic diastolic (congestive) heart failure: Secondary | ICD-10-CM | POA: Diagnosis not present

## 2023-01-12 DIAGNOSIS — E782 Mixed hyperlipidemia: Secondary | ICD-10-CM | POA: Diagnosis not present

## 2023-01-12 DIAGNOSIS — Z8505 Personal history of malignant neoplasm of liver: Secondary | ICD-10-CM | POA: Diagnosis not present

## 2023-01-12 DIAGNOSIS — D696 Thrombocytopenia, unspecified: Secondary | ICD-10-CM | POA: Diagnosis not present

## 2023-01-12 DIAGNOSIS — Z7984 Long term (current) use of oral hypoglycemic drugs: Secondary | ICD-10-CM | POA: Diagnosis not present

## 2023-01-12 DIAGNOSIS — E1122 Type 2 diabetes mellitus with diabetic chronic kidney disease: Secondary | ICD-10-CM | POA: Diagnosis not present

## 2023-01-12 DIAGNOSIS — I480 Paroxysmal atrial fibrillation: Secondary | ICD-10-CM | POA: Diagnosis not present

## 2023-01-12 DIAGNOSIS — N1831 Chronic kidney disease, stage 3a: Secondary | ICD-10-CM | POA: Diagnosis not present

## 2023-01-12 DIAGNOSIS — N179 Acute kidney failure, unspecified: Secondary | ICD-10-CM | POA: Diagnosis not present

## 2023-01-12 DIAGNOSIS — I130 Hypertensive heart and chronic kidney disease with heart failure and stage 1 through stage 4 chronic kidney disease, or unspecified chronic kidney disease: Secondary | ICD-10-CM | POA: Diagnosis not present

## 2023-01-12 DIAGNOSIS — M109 Gout, unspecified: Secondary | ICD-10-CM | POA: Diagnosis not present

## 2023-01-18 ENCOUNTER — Telehealth: Payer: Self-pay | Admitting: Physician Assistant

## 2023-01-18 DIAGNOSIS — I480 Paroxysmal atrial fibrillation: Secondary | ICD-10-CM | POA: Diagnosis not present

## 2023-01-18 DIAGNOSIS — Z8505 Personal history of malignant neoplasm of liver: Secondary | ICD-10-CM | POA: Diagnosis not present

## 2023-01-18 DIAGNOSIS — E782 Mixed hyperlipidemia: Secondary | ICD-10-CM | POA: Diagnosis not present

## 2023-01-18 DIAGNOSIS — M109 Gout, unspecified: Secondary | ICD-10-CM | POA: Diagnosis not present

## 2023-01-18 DIAGNOSIS — I5032 Chronic diastolic (congestive) heart failure: Secondary | ICD-10-CM | POA: Diagnosis not present

## 2023-01-18 DIAGNOSIS — N1831 Chronic kidney disease, stage 3a: Secondary | ICD-10-CM | POA: Diagnosis not present

## 2023-01-18 DIAGNOSIS — Z794 Long term (current) use of insulin: Secondary | ICD-10-CM | POA: Diagnosis not present

## 2023-01-18 DIAGNOSIS — Z87440 Personal history of urinary (tract) infections: Secondary | ICD-10-CM | POA: Diagnosis not present

## 2023-01-18 DIAGNOSIS — E1122 Type 2 diabetes mellitus with diabetic chronic kidney disease: Secondary | ICD-10-CM | POA: Diagnosis not present

## 2023-01-18 DIAGNOSIS — N179 Acute kidney failure, unspecified: Secondary | ICD-10-CM | POA: Diagnosis not present

## 2023-01-18 DIAGNOSIS — E538 Deficiency of other specified B group vitamins: Secondary | ICD-10-CM | POA: Diagnosis not present

## 2023-01-18 DIAGNOSIS — D696 Thrombocytopenia, unspecified: Secondary | ICD-10-CM | POA: Diagnosis not present

## 2023-01-18 DIAGNOSIS — Z7984 Long term (current) use of oral hypoglycemic drugs: Secondary | ICD-10-CM | POA: Diagnosis not present

## 2023-01-18 DIAGNOSIS — I130 Hypertensive heart and chronic kidney disease with heart failure and stage 1 through stage 4 chronic kidney disease, or unspecified chronic kidney disease: Secondary | ICD-10-CM | POA: Diagnosis not present

## 2023-01-18 DIAGNOSIS — Z7901 Long term (current) use of anticoagulants: Secondary | ICD-10-CM | POA: Diagnosis not present

## 2023-01-18 NOTE — Telephone Encounter (Signed)
Received Home Support forms from [facility]. Gave to Lauren for completion-Toni

## 2023-01-20 ENCOUNTER — Telehealth: Payer: Self-pay

## 2023-01-20 NOTE — Telephone Encounter (Signed)
Faxed [facility] fro [facility]

## 2023-01-21 ENCOUNTER — Other Ambulatory Visit: Payer: Self-pay | Admitting: Physician Assistant

## 2023-01-21 DIAGNOSIS — E1129 Type 2 diabetes mellitus with other diabetic kidney complication: Secondary | ICD-10-CM

## 2023-01-21 NOTE — Progress Notes (Unsigned)
Cardiology Office Note    Date:  01/24/2023   ID:  [name], DOB [birth year], MRN [id number]  PCP:  Carlean Jews, PA-C  Cardiologist:  Yvonne Kendall, MD  Electrophysiologist:  None   Chief Complaint: Follow up  History of Present Illness:   Adriana Miller is a 67 y.o. 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    She was diagnosed with new onset A-fib in 09/2018 and referred to the ED where she was placed on diltiazem and apixaban.  Plan was to pursue TEE guided DCCV upon establishing with cardiology in 12/2018.  However, she spontaneously converted to sinus rhythm.  Echo from 12/2018 showed an EF of 55 to 60%, normal RV systolic function and ventricular cavity size, trivial tricuspid regurgitation, and an estimated right atrial pressure of 3 mmHg.  She was diagnosed with atrial flutter in 05/2019 with subsequent spontaneous conversion noted at follow-up in 06/2019.  She was noted to be back in A-fib with RVR in 07/2019 and transferred to the ED where she was hydrated and administered IV diltiazem for rate control with outpatient follow-up.  In follow-up in 10/2019, she had converted to sinus bradycardia, with sinus rhythm noted at cardiology visits thereafter.     She has had multiple hospital admissions over this past year, including several times in 05/2022 for mechanical fall associated with rhabdomyolysis.  In 08/2021 with intractable nausea and vomiting and A-fib with RVR with symptomatic improvement with IV hydration and rate control.  Echo in 08/2022 showed an EF of 55 to 60%, no regional wall motion abnormalities, normal RV systolic function and ventricular cavity size, mildly to moderately dilated left atrium, and no significant valvular  abnormalities.  She was admitted in 09/2022 with E. coli UTI complicated by A-fib with RVR and AKI.   She was seen in the office on 11/16/2022, and was maintaining sinus rhythm at that time.  She reported she was preparing her own medications, though was unclear what she was taking outside of allopurinol, apixaban, and furosemide.  Further recommendations were unable to be made given lack of clarity from a medication perspective with recommendation for home health aide to assist with pillbox or pill pockets.   She was admitted to the hospital in late 10/2022 with severe sepsis secondary to E. coli UTI.  She was in atrial flutter with RVR with rates in the 1-teens bpm.  She was managed by the hospitalist service with recommendation to continue metoprolol and apixaban for her A-fib/flutter.  CTA chest showed no acute intracranial abnormality with chronic small vessel changes and prior right craniotomy.  CT of the abdomen/pelvis showed cholelithiasis without evidence of acute cholecystitis, hepatic steatosis, small hiatal hernia, and small uterine fibroid.  She was seen in the ED on 12/08/2022 with weakness.  She remained in atrial flutter with variable AV block with rates in the 1 teens bpm.  Ethanol elevated at 330.  She was felt to be dehydrated and treated with IV fluids with resolution of weakness.    She was last seen in the office on 9//2024 and remained in A-fib/flutter.  She reported adherence to apixaban twice daily and denied missing any doses.  In this setting, she underwent successful DCCV on 12/27/2022.  She comes in today and is without symptoms  of angina or cardiac decompensation.  She has redeveloped atrial flutter at some point following her DCCV.  She is completely asymptomatic with this.  She also reports that she has missed her evening doses of apixaban approximately 4 times since her cardioversion on 12/27/2022.  No strokelike symptoms.  No palpitations, dizziness, presyncope, or syncope.  No  falls or symptoms concerning for bleeding.  No lower extremity swelling or progressive orthopnea.   Labs independently reviewed: 12/2022 - A1c 5.2, potassium 3.9, BUN 18, serum creatinine 1.87, albumin 2.6, AST 64, ALT normal, Hgb 10.8, PLT 122 10/2022 - TSH normal, magnesium 2.1 10/2021 - TC 134, TG 71, HDL 32, LDL 88   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)    B12 deficiency 04/05/2017   Blood in stool    Cerebral aneurysm    Chronic kidney disease    Diabetes mellitus without complication (HCC)    type 2   Gastric erosion with bleeding    Hepatocellular carcinoma (HCC)    Hyperlipidemia    Hypertension    Lower extremity cellulitis 02/08/2019   Paroxysmal A-fib (HCC)    Seizure disorder (HCC)     Past Surgical History:  Procedure Laterality Date   CARDIOVERSION N/A 12/27/2022   Procedure: CARDIOVERSION;  Surgeon: Debbe Odea, MD;  Location: [facility] ORS;  Service: Cardiovascular;  Laterality: N/A;   CEREBRAL ANEURYSM REPAIR  1991   COLONOSCOPY WITH PROPOFOL N/A 02/12/2019   Procedure: COLONOSCOPY WITH PROPOFOL;  Surgeon: Toney Reil, MD;  Location: [facility] ENDOSCOPY;  Service: Gastroenterology;  Laterality: N/A;   ESOPHAGOGASTRODUODENOSCOPY (EGD) WITH PROPOFOL N/A 02/12/2019   Procedure: ESOPHAGOGASTRODUODENOSCOPY (EGD) WITH PROPOFOL;  Surgeon: Toney Reil, MD;  Location: [facility]COPY;  Service: Gastroenterology;  Laterality: N/A;   IR ANGIOGRAM SELECTIVE EACH ADDITIONAL VESSEL  09/21/2019   IR ANGIOGRAM SELECTIVE EACH ADDITIONAL VESSEL  09/21/2019   IR ANGIOGRAM SELECTIVE EACH ADDITIONAL VESSEL  09/21/2019   IR ANGIOGRAM SELECTIVE EACH ADDITIONAL VESSEL  09/21/2019   IR ANGIOGRAM SELECTIVE EACH ADDITIONAL VESSEL  10/02/2019   IR ANGIOGRAM VISCERAL SELECTIVE  09/21/2019   IR ANGIOGRAM VISCERAL SELECTIVE  09/21/2019   IR ANGIOGRAM VISCERAL SELECTIVE  10/02/2019   IR EMBO TUMOR ORGAN ISCHEMIA INFARCT INC GUIDE ROADMAPPING   10/02/2019   IR EMBO TUMOR ORGAN ISCHEMIA INFARCT INC GUIDE ROADMAPPING  10/02/2019   IR RADIOLOGIST EVAL & MGMT  08/28/2019   IR RADIOLOGIST EVAL & MGMT  01/30/2020   IR RADIOLOGIST EVAL & MGMT  04/30/2020   IR RADIOLOGIST EVAL & MGMT  07/30/2020   IR RADIOLOGIST EVAL & MGMT  10/28/2020   IR RADIOLOGIST EVAL & MGMT  02/24/2021   IR RADIOLOGIST EVAL & MGMT  06/08/2021   IR RADIOLOGIST EVAL & MGMT  11/24/2021   IR RADIOLOGIST EVAL & MGMT  05/25/2022   IR US GUIDE VASC ACCESS LEFT  09/21/2019   IR US GUIDE VASC ACCESS LEFT  10/02/2019   OPEN REDUCTION INTERNAL FIXATION (ORIF) DISTAL RADIAL FRACTURE Right 08/06/2021   Procedure: OPEN REDUCTION INTERNAL FIXATION (ORIF) DISTAL RADIAL FRACTURE;  Surgeon: Kennedy Bucker, MD;  Location: [facility] ORS;  Service: Orthopedics;  Laterality: Right;    Current Medications: Current Meds  Medication Sig   acetaminophen (TYLENOL) 325 MG tablet Take 2 tablets (650 mg total) by mouth every 6 (six) hours as needed for mild pain (or Fever >/= 101).   Alcohol Swabs (B-D SINGLE USE SWABS REGULAR) PADS USE AS DIRECTED TWICE DAILY  allopurinol (ZYLOPRIM) 100 MG tablet Take 1 tablet (100 mg total) by mouth daily.   apixaban (ELIQUIS) 5 MG TABS tablet Take 1 tablet (5 mg total) by mouth 2 (two) times daily.   Blood Glucose Monitoring Suppl (ACCU-CHEK GUIDE ME) w/Device KIT Use as directed  dx E11.65   Calcium Carb-Cholecalciferol (CALCIUM 500/D) 500-5 MG-MCG TABS Take 1 tab po daily   Continuous Blood Gluc Receiver (DEXCOM G7 RECEIVER) DEVI Use for continuous blood glucose monitoring  DX E11.65   Continuous Glucose Sensor (DEXCOM G7 SENSOR) MISC USE AS DIRECTED FOR CONTINUOUS  GLUCOSE MONITORING. CHANGE  SENSOR EVERY 10 DAYS   ergocalciferol (VITAMIN D2) 1.25 MG (50000 UT) capsule Take 1 capsule (50,000 Units total) by mouth once a week.   escitalopram (LEXAPRO) 10 MG tablet Take 1 tablet daily for anxiety/sleep.   folic acid (FOLVITE) 1 MG tablet Take 1 tablet (1 mg total) by mouth  daily.   furosemide (LASIX) 20 MG tablet TAKE 1 TABLET BY MOUTH EVERY DAY   glucose blood (ACCU-CHEK GUIDE) test strip Use as instructed twice a day DX E11.65   ibuprofen (ADVIL) 200 MG tablet Take 200 mg by mouth as needed for headache or moderate pain.   insulin glargine, 1 Unit Dial, (TOUJEO SOLOSTAR) 300 UNIT/ML Solostar Pen Inject 10 Units into the skin daily with supper.   Insulin Pen Needle (BD PEN NEEDLE NANO 2ND GEN) 32G X 4 MM MISC USE AS DIRECTED WITH TOUJEO   mirtazapine (REMERON) 7.5 MG tablet TAKE 1 TABLET BY MOUTH DAILY AT  BEDTIME   potassium chloride (KLOR-CON) 8 MEQ tablet TAKE 1 TABLET BY MOUTH 3 TIMES  WEEKLY ON MONDAY, WEDNESDAY, AND FRIDAY   Safety Lancets 28G MISC Use as directed once a daily Dx e11.65   spironolactone (ALDACTONE) 25 MG tablet Take 1 tablet (25 mg total) by mouth daily.   thiamine (VITAMIN B-1) 100 MG tablet Take 1 tablet (100 mg total) by mouth daily.   traZODone (DESYREL) 50 MG tablet TAKE 1 TO 2 TABLETS BY MOUTH AT  BEDTIME FOR INSOMNIA   [DISCONTINUED] metoprolol tartrate (LOPRESSOR) 25 MG tablet TAKE ONE-HALF TABLET BY MOUTH  TWICE DAILY    Allergies:   Aspirin   Social History   Socioeconomic History   Marital status: Divorced    Spouse name: Not on file   Number of children: Not on file   Years of education: Not on file   Highest education level: Not on file  Occupational History   Not on file  Tobacco Use   Smoking status: Never   Smokeless tobacco: Current    Types: Snuff  Vaping Use   Vaping status: Never Used  Substance and Sexual Activity   Alcohol use: Not Currently    Alcohol/week: 5.0 standard drinks of alcohol    Types: 5 Cans of beer per week   Drug use: No   Sexual activity: Not Currently  Other Topics Concern   Not on file  Social History Narrative   Lives alone   Social Determinants of Health   Financial Resource Strain: Not on file  Food Insecurity: No Food Insecurity (12/13/2022)   Hunger Vital Sign     Worried About Running Out of Food in the Last Year: Never true    Ran Out of Food in the Last Year: Never true  Transportation Needs: No Transportation Needs (12/13/2022)   PRAPARE - Administrator, Civil Service (Medical): No    Lack of Transportation (Non-Medical): No  Physical Activity: Not on file  Stress: Not on file  Social Connections: Not on file     Family History:  The patient's family history includes Heart attack (age of onset: 28) in her brother.  ROS:   12-point review of systems is negative unless otherwise noted in the HPI.   EKGs/Labs/Other Studies Reviewed:    Studies reviewed were summarized above. The additional studies were reviewed today:  2D echo 08/22/2022: 1. Left ventricular ejection fraction, by estimation, is 55 to 60%. The  left ventricle has normal function. The left ventricle has no regional  wall motion abnormalities. Left ventricular diastolic parameters are  indeterminate.   2. Right ventricular systolic function is normal. The right ventricular  size is normal.   3. Left atrial size was mild to moderately dilated.   4. The mitral valve is normal in structure. No evidence of mitral valve  regurgitation.   5. The aortic valve is bicuspid. Aortic valve regurgitation is not  visualized.  __________   2D echo 01/15/2019: 1. Left ventricular ejection fraction, by visual estimation, is 55 to  60%. The left ventricle has normal function. Normal left ventricular size.  There is no left ventricular hypertrophy.   2. Global right ventricle has normal systolic function.The right  ventricular size is normal. No increase in right ventricular wall  thickness.   3. Left atrial size was normal.   4. Right atrial size was normal.   5. The mitral valve is normal in structure. No evidence of mitral valve  regurgitation. No evidence of mitral stenosis.   6. The tricuspid valve is normal in structure. Tricuspid valve  regurgitation is trivial.   7.  The aortic valve is normal in structure. Aortic valve regurgitation  was not visualized by color flow Doppler. Structurally normal aortic  valve, with no evidence of sclerosis or stenosis.   8. The pulmonic valve was normal in structure. Pulmonic valve  regurgitation is trivial by color flow Doppler.   9. TR signal is inadequate for assessing pulmonary artery systolic  pressure.  10. The inferior vena cava is normal in size with greater than 50%  respiratory variability, suggesting right atrial pressure of 3 mmHg.    EKG:  EKG is ordered today.  The EKG ordered today demonstrates atrial flutter with variable AV block with RVR, 128 bpm, nonspecific ST-T changes  Recent Labs: 11/17/2022: B Natriuretic Peptide 152.5; Magnesium 2.1; TSH 3.029 12/22/2022: ALT 32; BUN 18; Creatinine, Ser 1.87; Hemoglobin 10.8; Platelets 122; Potassium 3.9; Sodium 135  Recent Lipid Panel    Component Value Date/Time   CHOL 134 11/16/2021 1716   CHOL 134 01/27/2018 1340   TRIG 71 11/16/2021 1716   HDL 32 (L) 11/16/2021 1716   HDL 80 01/27/2018 1340   CHOLHDL 4.2 11/16/2021 1716   VLDL 14 11/16/2021 1716   LDLCALC 88 11/16/2021 1716   LDLCALC 42 01/27/2018 1340    PHYSICAL EXAM:    VS:  BP 108/68   Pulse (!) 128   Ht 5\' 6"  (1.676 m)   Wt 180 lb (81.6 kg)   SpO2 97%   BMI 29.05 kg/m   BMI: Body mass index is 29.05 kg/m.  Physical Exam Vitals reviewed.  Constitutional:      Appearance: She is well-developed.  HENT:     Head: Normocephalic and atraumatic.  Eyes:     General:        Right eye: No discharge.        Left eye:  No discharge.  Cardiovascular:     Rate and Rhythm: Tachycardia present. Rhythm irregularly irregular.     Pulses:          Posterior tibial pulses are 2+ on the right side and 2+ on the left side.     Heart sounds: Normal heart sounds, S1 normal and S2 normal. Heart sounds not distant. No midsystolic click and no opening snap. No murmur heard.    No friction rub.   Pulmonary:     Effort: Pulmonary effort is normal. No respiratory distress.     Breath sounds: Normal breath sounds. No decreased breath sounds, wheezing, rhonchi or rales.  Chest:     Chest wall: No tenderness.  Abdominal:     General: There is no distension.  Musculoskeletal:     Cervical back: Normal range of motion.     Right lower leg: No edema.     Left lower leg: No edema.  Skin:    General: Skin is warm and dry.     Nails: There is no clubbing.  Neurological:     Mental Status: She is alert and oriented to person, place, and time.  Psychiatric:        Speech: Speech normal.        Behavior: Behavior normal.        Thought Content: Thought content normal.        Judgment: Judgment normal.     Wt Readings from Last 3 Encounters:  01/24/23 180 lb (81.6 kg)  12/27/22 170 lb (77.1 kg)  12/22/22 169 lb (76.7 kg)     ASSESSMENT & PLAN:   Persistent A-fib/flutter: Status post briefly successful DCCV with recurrent atrial flutter.  Ongoing pharmacological options do appear somewhat limited in the context of her underlying comorbid conditions including relative hypotension, liver cirrhosis and renal dysfunction.  Fortunately, she is completely asymptomatic in atrial flutter, however ventricular rates are suboptimally controlled.  Titrate Lopressor to 25 mg twice daily.  Refer to EP for further recommendations.  CHA2DS2-VASc at least 4.  Remains on apixaban 5 mg twice daily.  She did miss approximately 4 days of evening apixaban following DCCV.  Importance of anticoagulation was discussed with patient.  HTN: Blood pressure is well-controlled in the office today.  She remains on Lopressor and spironolactone.  Cirrhosis with hepatic lesions: Remains on spironolactone.  Will defer follow-up imaging of hepatic lesions to PCP.  CKD stage III-IV: Recommend close monitoring through PCP's office (noncardiac usage of spironolactone ) of renal function/potassium on spironolactone with  underlying renal dysfunction.   Disposition: F/u with Dr. Okey Dupre or an APP in 3 months.   Medication Adjustments/Labs and Tests Ordered: Current medicines are reviewed at length with the patient today.  Concerns regarding medicines are outlined above. Medication changes, Labs and Tests ordered today are summarized above and listed in the Patient Instructions accessible in Encounters.   Signed, Eula Listen, PA-C 01/24/2023 4:18 PM     [facility] -  [address] [place], [place] [address] [phone]

## 2023-01-24 ENCOUNTER — Ambulatory Visit: Payer: 59 | Attending: Physician Assistant | Admitting: Physician Assistant

## 2023-01-24 ENCOUNTER — Encounter: Payer: Self-pay | Admitting: Physician Assistant

## 2023-01-24 VITALS — BP 108/68 | HR 128 | Ht 66.0 in | Wt 180.0 lb

## 2023-01-24 DIAGNOSIS — I4892 Unspecified atrial flutter: Secondary | ICD-10-CM | POA: Diagnosis not present

## 2023-01-24 DIAGNOSIS — I4819 Other persistent atrial fibrillation: Secondary | ICD-10-CM

## 2023-01-24 DIAGNOSIS — N183 Chronic kidney disease, stage 3 unspecified: Secondary | ICD-10-CM | POA: Diagnosis not present

## 2023-01-24 DIAGNOSIS — K7460 Unspecified cirrhosis of liver: Secondary | ICD-10-CM

## 2023-01-24 DIAGNOSIS — I10 Essential (primary) hypertension: Secondary | ICD-10-CM

## 2023-01-24 MED ORDER — METOPROLOL TARTRATE 25 MG PO TABS: 25.0000 mg | ORAL_TABLET | Freq: Two times a day (BID) | ORAL | 2 refills | Status: DC

## 2023-01-24 NOTE — Patient Instructions (Signed)
Medication Instructions:  Your physician recommends the following medication changes.  START TAKING: Lopressor 25 mg twice daily  *If you need a refill on your cardiac medications before your next appointment, please call your pharmacy*   Follow-Up: At [facility], you and your health needs are our priority.  As part of our continuing mission to provide you with exceptional heart care, we have created designated Provider Care Teams.  These Care Teams include your primary Cardiologist (physician) and Advanced Practice Providers (APPs -  Physician Assistants and Nurse Practitioners) who all work together to provide you with the care you need, when you need it.  We recommend signing up for the patient portal called "MyChart".  Sign up information is provided on this After Visit Summary.  MyChart is used to connect with patients for Virtual Visits (Telemedicine).  Patients are able to view lab/test results, encounter notes, upcoming appointments, etc.  Non-urgent messages can be sent to your provider as well.   To learn more about what you can do with MyChart, go to [email or web address].    Your next appointment:   3 month(s)  Provider:   You may see Yvonne Kendall, MD or one of the following Advanced Practice Providers on your designated Care Team:   Eula Listen, [place]

## 2023-01-26 DIAGNOSIS — Z8505 Personal history of malignant neoplasm of liver: Secondary | ICD-10-CM | POA: Diagnosis not present

## 2023-01-26 DIAGNOSIS — N179 Acute kidney failure, unspecified: Secondary | ICD-10-CM | POA: Diagnosis not present

## 2023-01-26 DIAGNOSIS — M109 Gout, unspecified: Secondary | ICD-10-CM | POA: Diagnosis not present

## 2023-01-26 DIAGNOSIS — E538 Deficiency of other specified B group vitamins: Secondary | ICD-10-CM | POA: Diagnosis not present

## 2023-01-26 DIAGNOSIS — I480 Paroxysmal atrial fibrillation: Secondary | ICD-10-CM | POA: Diagnosis not present

## 2023-01-26 DIAGNOSIS — Z7984 Long term (current) use of oral hypoglycemic drugs: Secondary | ICD-10-CM | POA: Diagnosis not present

## 2023-01-26 DIAGNOSIS — D696 Thrombocytopenia, unspecified: Secondary | ICD-10-CM | POA: Diagnosis not present

## 2023-01-26 DIAGNOSIS — Z87440 Personal history of urinary (tract) infections: Secondary | ICD-10-CM | POA: Diagnosis not present

## 2023-01-26 DIAGNOSIS — Z794 Long term (current) use of insulin: Secondary | ICD-10-CM | POA: Diagnosis not present

## 2023-01-26 DIAGNOSIS — Z7901 Long term (current) use of anticoagulants: Secondary | ICD-10-CM | POA: Diagnosis not present

## 2023-01-26 DIAGNOSIS — I5032 Chronic diastolic (congestive) heart failure: Secondary | ICD-10-CM | POA: Diagnosis not present

## 2023-01-26 DIAGNOSIS — E782 Mixed hyperlipidemia: Secondary | ICD-10-CM | POA: Diagnosis not present

## 2023-01-26 DIAGNOSIS — I130 Hypertensive heart and chronic kidney disease with heart failure and stage 1 through stage 4 chronic kidney disease, or unspecified chronic kidney disease: Secondary | ICD-10-CM | POA: Diagnosis not present

## 2023-01-26 DIAGNOSIS — N1831 Chronic kidney disease, stage 3a: Secondary | ICD-10-CM | POA: Diagnosis not present

## 2023-01-26 DIAGNOSIS — E1122 Type 2 diabetes mellitus with diabetic chronic kidney disease: Secondary | ICD-10-CM | POA: Diagnosis not present

## 2023-01-27 ENCOUNTER — Ambulatory Visit (INDEPENDENT_AMBULATORY_CARE_PROVIDER_SITE_OTHER): Payer: 59 | Admitting: Physician Assistant

## 2023-01-27 ENCOUNTER — Other Ambulatory Visit: Payer: Self-pay | Admitting: Physician Assistant

## 2023-01-27 ENCOUNTER — Encounter: Payer: Self-pay | Admitting: Physician Assistant

## 2023-01-27 VITALS — BP 127/76 | HR 72 | Temp 97.9°F | Resp 16 | Ht 66.0 in | Wt 166.2 lb

## 2023-01-27 DIAGNOSIS — I480 Paroxysmal atrial fibrillation: Secondary | ICD-10-CM

## 2023-01-27 DIAGNOSIS — E559 Vitamin D deficiency, unspecified: Secondary | ICD-10-CM

## 2023-01-27 DIAGNOSIS — E119 Type 2 diabetes mellitus without complications: Secondary | ICD-10-CM

## 2023-01-27 DIAGNOSIS — K7030 Alcoholic cirrhosis of liver without ascites: Secondary | ICD-10-CM

## 2023-01-27 DIAGNOSIS — M10222 Drug-induced gout, left elbow: Secondary | ICD-10-CM

## 2023-01-27 DIAGNOSIS — I10 Essential (primary) hypertension: Secondary | ICD-10-CM

## 2023-01-27 DIAGNOSIS — Z794 Long term (current) use of insulin: Secondary | ICD-10-CM

## 2023-01-27 NOTE — Progress Notes (Signed)
[facility] [address] [place], [place] [address]  Internal MEDICINE  Office Visit Note  Patient Name: Adriana Miller  [id number]  [id number]  Date of Service: 02/09/2023  Chief Complaint  Patient presents with   Follow-up    Review labs   Diabetes   Hypertension   Hyperlipidemia   Quality Metric Gaps    Eye Exam    HPI Pt is here for routine follow up -Labs reviewed over the phone -did have cardioversion with cardiology since last visit but states it was ultimately not successful and will be seeing EP -Stopped vit D -Unsure about last palliative visit--per chart has been months since home visit and unclear when next imaging for [facility] and cirrhosis monitoring/follow up. Will look into this further. Last MRI in Jan 2024. Patient has been to ED/hospital several times over the last few months with afib  Current Medication: Outpatient Encounter Medications as of 01/27/2023  Medication Sig   acetaminophen (TYLENOL) 325 MG tablet Take 2 tablets (650 mg total) by mouth every 6 (six) hours as needed for mild pain (or Fever >/= 101).   Alcohol Swabs (B-D SINGLE USE SWABS REGULAR) PADS USE AS DIRECTED TWICE DAILY   allopurinol (ZYLOPRIM) 100 MG tablet Take 1 tablet (100 mg total) by mouth daily.   apixaban (ELIQUIS) 5 MG TABS tablet Take 1 tablet (5 mg total) by mouth 2 (two) times daily.   Blood Glucose Monitoring Suppl (ACCU-CHEK GUIDE ME) w/Device KIT Use as directed  dx E11.65   Calcium Carb-Cholecalciferol (CALCIUM 500/D) 500-5 MG-MCG TABS Take 1 tab po daily   Continuous Blood Gluc Receiver (DEXCOM G7 RECEIVER) DEVI Use for continuous blood glucose monitoring  DX E11.65   Continuous Glucose Sensor (DEXCOM G7 SENSOR) MISC USE AS DIRECTED FOR CONTINUOUS  GLUCOSE MONITORING. CHANGE  SENSOR EVERY 10 DAYS   escitalopram (LEXAPRO) 10 MG tablet Take 1 tablet daily for anxiety/sleep.   folic acid (FOLVITE) 1 MG tablet Take 1 tablet (1 mg total) by mouth daily.   furosemide  (LASIX) 20 MG tablet TAKE 1 TABLET BY MOUTH EVERY DAY   glucose blood (ACCU-CHEK GUIDE) test strip Use as instructed twice a day DX E11.65   ibuprofen (ADVIL) 200 MG tablet Take 200 mg by mouth as needed for headache or moderate pain.   insulin glargine, 1 Unit Dial, (TOUJEO SOLOSTAR) 300 UNIT/ML Solostar Pen Inject 10 Units into the skin daily with supper.   Insulin Pen Needle (BD PEN NEEDLE NANO 2ND GEN) 32G X 4 MM MISC USE AS DIRECTED WITH TOUJEO   metoprolol tartrate (LOPRESSOR) 25 MG tablet Take 1 tablet (25 mg total) by mouth 2 (two) times daily.   mirtazapine (REMERON) 7.5 MG tablet TAKE 1 TABLET BY MOUTH DAILY AT  BEDTIME   Safety Lancets 28G MISC Use as directed once a daily Dx e11.65   spironolactone (ALDACTONE) 25 MG tablet Take 1 tablet (25 mg total) by mouth daily.   thiamine (VITAMIN B-1) 100 MG tablet Take 1 tablet (100 mg total) by mouth daily.   traZODone (DESYREL) 50 MG tablet TAKE 1 TO 2 TABLETS BY MOUTH AT  BEDTIME FOR INSOMNIA   [DISCONTINUED] ergocalciferol (VITAMIN D2) 1.25 MG (50000 UT) capsule Take 1 capsule (50,000 Units total) by mouth once a week.   [DISCONTINUED] potassium chloride (KLOR-CON) 8 MEQ tablet TAKE 1 TABLET BY MOUTH 3 TIMES  WEEKLY ON MONDAY, WEDNESDAY, AND FRIDAY   No facility-administered encounter medications on file as of 01/27/2023.  Surgical History: Past Surgical History:  Procedure Laterality Date   CARDIOVERSION N/A 12/27/2022   Procedure: CARDIOVERSION;  Surgeon: Debbe Odea, MD;  Location: [facility] ORS;  Service: Cardiovascular;  Laterality: N/A;   CEREBRAL ANEURYSM REPAIR  1991   COLONOSCOPY WITH PROPOFOL N/A 02/12/2019   Procedure: COLONOSCOPY WITH PROPOFOL;  Surgeon: Toney Reil, MD;  Location: [facility] ENDOSCOPY;  Service: Gastroenterology;  Laterality: N/A;   ESOPHAGOGASTRODUODENOSCOPY (EGD) WITH PROPOFOL N/A 02/12/2019   Procedure: ESOPHAGOGASTRODUODENOSCOPY (EGD) WITH PROPOFOL;  Surgeon: Toney Reil, MD;  Location:  [facility] ENDOSCOPY;  Service: Gastroenterology;  Laterality: N/A;   IR ANGIOGRAM SELECTIVE EACH ADDITIONAL VESSEL  09/21/2019   IR ANGIOGRAM SELECTIVE EACH ADDITIONAL VESSEL  09/21/2019   IR ANGIOGRAM SELECTIVE EACH ADDITIONAL VESSEL  09/21/2019   IR ANGIOGRAM SELECTIVE EACH ADDITIONAL VESSEL  09/21/2019   IR ANGIOGRAM SELECTIVE EACH ADDITIONAL VESSEL  10/02/2019   IR ANGIOGRAM VISCERAL SELECTIVE  09/21/2019   IR ANGIOGRAM VISCERAL SELECTIVE  09/21/2019   IR ANGIOGRAM VISCERAL SELECTIVE  10/02/2019   IR EMBO TUMOR ORGAN ISCHEMIA INFARCT INC GUIDE ROADMAPPING  10/02/2019   IR EMBO TUMOR ORGAN ISCHEMIA INFARCT INC GUIDE ROADMAPPING  10/02/2019   IR RADIOLOGIST EVAL & MGMT  08/28/2019   IR RADIOLOGIST EVAL & MGMT  01/30/2020   IR RADIOLOGIST EVAL & MGMT  04/30/2020   IR RADIOLOGIST EVAL & MGMT  07/30/2020   IR RADIOLOGIST EVAL & MGMT  10/28/2020   IR RADIOLOGIST EVAL & MGMT  02/24/2021   IR RADIOLOGIST EVAL & MGMT  06/08/2021   IR RADIOLOGIST EVAL & MGMT  11/24/2021   IR RADIOLOGIST EVAL & MGMT  05/25/2022   IR US GUIDE VASC ACCESS LEFT  09/21/2019   IR US GUIDE VASC ACCESS LEFT  10/02/2019   OPEN REDUCTION INTERNAL FIXATION (ORIF) DISTAL RADIAL FRACTURE Right 08/06/2021   Procedure: OPEN REDUCTION INTERNAL FIXATION (ORIF) DISTAL RADIAL FRACTURE;  Surgeon: Kennedy Bucker, MD;  Location: [facility] ORS;  Service: Orthopedics;  Laterality: Right;    Medical History: 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)    B12 deficiency 04/05/2017   Blood in stool    Cerebral aneurysm    Chronic kidney disease    Diabetes mellitus without complication (HCC)    type 2   Gastric erosion with bleeding    Hepatocellular carcinoma (HCC)    Hyperlipidemia    Hypertension    Lower extremity cellulitis 02/08/2019   Paroxysmal A-fib (HCC)    Seizure disorder (HCC)     Family History: Family History  Problem Relation Age of Onset   Heart attack Brother 82    Social History    Socioeconomic History   Marital status: Divorced    Spouse name: Not on file   Number of children: Not on file   Years of education: Not on file   Highest education level: Not on file  Occupational History   Not on file  Tobacco Use   Smoking status: Never   Smokeless tobacco: Current    Types: Snuff  Vaping Use   Vaping status: Never Used  Substance and Sexual Activity   Alcohol use: Not Currently    Alcohol/week: 5.0 standard drinks of alcohol    Types: 5 Cans of beer per week   Drug use: No   Sexual activity: Not Currently  Other Topics Concern   Not on file  Social History Narrative   Lives alone   Social Determinants of Health  Financial Resource Strain: Not on file  Food Insecurity: No Food Insecurity (12/13/2022)   Hunger Vital Sign    Worried About Running Out of Food in the Last Year: Never true    Ran Out of Food in the Last Year: Never true  Transportation Needs: No Transportation Needs (12/13/2022)   PRAPARE - Administrator, Civil Service (Medical): No    Lack of Transportation (Non-Medical): No  Physical Activity: Not on file  Stress: Not on file  Social Connections: Not on file  Intimate Partner Violence: Not At Risk (11/18/2022)   Humiliation, Afraid, Rape, and Kick questionnaire    Fear of Current or Ex-Partner: No    Emotionally Abused: No    Physically Abused: No    Sexually Abused: No      Review of Systems  Constitutional:  Negative for chills and unexpected weight change.  HENT:  Negative for congestion, rhinorrhea, sneezing and sore throat.   Eyes:  Negative for redness.  Respiratory:  Negative for cough, chest tightness and shortness of breath.   Cardiovascular:  Negative for chest pain and palpitations.  Gastrointestinal:  Negative for abdominal pain, constipation, diarrhea, nausea and vomiting.  Genitourinary:  Negative for dysuria and frequency.  Musculoskeletal:  Positive for arthralgias. Negative for back pain, joint  swelling and neck pain.  Skin:  Negative for rash.  Neurological:  Positive for weakness. Negative for tremors and numbness.  Hematological:  Negative for adenopathy. Does not bruise/bleed easily.  Psychiatric/Behavioral:  Negative for behavioral problems (Depression), sleep disturbance and suicidal ideas. The patient is not nervous/anxious.     Vital Signs: BP 127/76   Pulse 72   Temp 97.9 F (36.6 C)   Resp 16   Ht 5\' 6"  (1.676 m)   Wt 166 lb 3.2 oz (75.4 kg)   SpO2 99%   BMI 26.83 kg/m    Physical Exam Constitutional:      General: She is not in acute distress.    Appearance: She is well-developed. She is not diaphoretic.  HENT:     Head: Normocephalic and atraumatic.     Mouth/Throat:     Pharynx: No oropharyngeal exudate.  Eyes:     Pupils: Pupils are equal, round, and reactive to light.  Neck:     Thyroid: No thyromegaly.     Vascular: No JVD.     Trachea: No tracheal deviation.  Cardiovascular:     Rate and Rhythm: Normal rate. Rhythm irregular.     Heart sounds: Normal heart sounds.  Pulmonary:     Effort: Pulmonary effort is normal. No respiratory distress.     Breath sounds: No wheezing or rales.  Chest:     Chest wall: No tenderness.  Abdominal:     General: Bowel sounds are normal.     Palpations: Abdomen is soft.  Musculoskeletal:        General: Normal range of motion.     Cervical back: Normal range of motion and neck supple.  Lymphadenopathy:     Cervical: No cervical adenopathy.  Skin:    General: Skin is warm and dry.  Neurological:     Mental Status: She is alert and oriented to person, place, and time.     Cranial Nerves: No cranial nerve deficit.     Comments: Utilizes cane for mobility  Psychiatric:        Behavior: Behavior normal.        Thought Content: Thought content normal.  Judgment: Judgment normal.        Assessment/Plan: 1. Paroxysmal atrial fibrillation (HCC) Follow up with cardiology as planned  2. Essential  hypertension Stable, continue current medication  3. Alcoholic cirrhosis of liver without ascites (HCC) Continue to avoid alcohol and will look into next follow up for monitoring of this and HCC  4. Type 2 diabetes mellitus without complication, with long-term current use of insulin (HCC) A1c in normal range, continue to stay off of glimepiride. May be able to decrease insulin in future, continue with continuous monitor   General Counseling: Adriana Miller understanding of the findings of todays visit and agrees with plan of treatment. I have discussed any further diagnostic evaluation that may be needed or ordered today. We also reviewed her medications today. she has been encouraged to call the office with any questions or concerns that should arise related to todays visit.    No orders of the defined types were placed in this encounter.   No orders of the defined types were placed in this encounter.   This patient was seen by Lynn Ito, PA-C in collaboration with Dr. Beverely Risen as a part of collaborative care agreement.   Total time spent:30 Minutes Time spent includes review of chart, medications, test results, and follow up plan with the patient.      Dr Lyndon Code Internal medicine

## 2023-01-28 ENCOUNTER — Other Ambulatory Visit: Payer: Self-pay

## 2023-01-28 DIAGNOSIS — M10222 Drug-induced gout, left elbow: Secondary | ICD-10-CM

## 2023-02-09 ENCOUNTER — Other Ambulatory Visit: Payer: Self-pay | Admitting: Interventional Radiology

## 2023-02-09 DIAGNOSIS — C220 Liver cell carcinoma: Secondary | ICD-10-CM

## 2023-02-11 ENCOUNTER — Ambulatory Visit: Payer: Self-pay

## 2023-02-11 NOTE — Patient Outreach (Signed)
Care Coordination   Follow Up Visit Note   02/11/2023 Name: Adriana Miller MRN: [id number] DOB: [birth year]  Adriana Miller is a 67 y.o. year old female who sees McDonough, Salomon Fick, PA-C for primary care. I spoke with  Adriana Miller by phone today.  What matters to the patients health and wellness today?  Patient states she is doing about the same. Denies having any heart failure or atrial fibrillation symptoms at this time.   Patient confirmed she is scheduled to have an MRI 02/16/23 due to difficulty swallowing and follow up with gastroenterologist 02/2023.  Patient reports having recent cardiology and pulmonary follow up visit. She reports her metoprolol was increased to 1 pill 2 x per day.  Patient states she is taking all medications as prescribed.    Goals Addressed             This Visit's Progress    Management of chronic health conditions       Interventions Today    Flowsheet Row Most Recent Value  Chronic Disease   Chronic disease during today's visit Diabetes, Atrial Fibrillation (AFib), Other  [difficulty swallowing]  General Interventions   General Interventions Discussed/Reviewed General Interventions Reviewed, Doctor Visits  [evaluation of current treatment plan for mentioned health conditions and patients adherence to plan as established by provider. Assessed for HF/ atrial fibrillation symptoms. Assessed for worsening swallowing symptoms.]  Doctor Visits Discussed/Reviewed Doctor Visits Reviewed  [Confirmed patient has dates on calendar for upcoming MRI and gastroenterology follow up.   Advised to keep all follow up visits with provider as recommended.]  Education Interventions   Education Provided Provided Education  [Advised to notify provider for any increase in SOB or chest discomfort and call 911 for severe symptoms. Advised to continue to eat small bites making sure to chew food thoroughly before swallowing and stick to foods that are easily chewed.]   Pharmacy Interventions   Pharmacy Dicussed/Reviewed Pharmacy Topics Reviewed  Algis Downs to continue to take medications as prescribed.]              SDOH assessments and interventions completed:  No     Care Coordination Interventions:  Yes, provided   Follow up plan: Follow up call scheduled for 03/23/23    Encounter Outcome:  Patient Visit Completed   George Ina RN,BSN,CCM [facility] Care Coordination [phone] direct line

## 2023-02-11 NOTE — Patient Instructions (Signed)
Visit Information  Thank you for taking time to visit with me today. Please don't hesitate to contact me if I can be of assistance to you.   Following are the goals we discussed today:   Goals Addressed             This Visit's Progress    Management of chronic health conditions       Interventions Today    Flowsheet Row Most Recent Value  Chronic Disease   Chronic disease during today's visit Diabetes, Atrial Fibrillation (AFib), Other  [difficulty swallowing]  General Interventions   General Interventions Discussed/Reviewed General Interventions Reviewed, Doctor Visits  [evaluation of current treatment plan for mentioned health conditions and patients adherence to plan as established by provider. Assessed for HF/ atrial fibrillation symptoms. Assessed for worsening swallowing symptoms.]  Doctor Visits Discussed/Reviewed Doctor Visits Reviewed  [Confirmed patient has dates on calendar for upcoming MRI and gastroenterology follow up.   Advised to keep all follow up visits with provider as recommended.]  Education Interventions   Education Provided Provided Education  [Advised to notify provider for any increase in SOB or chest discomfort and call 911 for severe symptoms. Advised to continue to eat small bites making sure to chew food thoroughly before swallowing and stick to foods that are easily chewed.]  Pharmacy Interventions   Pharmacy Dicussed/Reviewed Pharmacy Topics Reviewed  Algis Downs to continue to take medications as prescribed.]              Our next appointment is by telephone on 03/23/23 at 2 pm  Please call the care guide team at [phone] if you need to cancel or reschedule your appointment.   If you are experiencing a Mental Health or Behavioral Health Crisis or need someone to talk to, please call the Suicide and Crisis Lifeline: 988 call 1-800-273-TALK (toll free, 24 hour hotline)  The patient verbalized understanding of instructions, educational materials,  and care plan provided today and agreed to receive a mailed copy of patient instructions, educational materials, and care plan.   George Ina RN,BSN,CCM [facility] Care Coordination [phone] direct line

## 2023-02-14 ENCOUNTER — Emergency Department: Payer: 59

## 2023-02-14 ENCOUNTER — Other Ambulatory Visit: Payer: Self-pay

## 2023-02-14 ENCOUNTER — Inpatient Hospital Stay
Admission: EM | Admit: 2023-02-14 | Discharge: 2023-02-28 | DRG: 563 | Disposition: A | Discharge status: Skilled Nursing Facility | Payer: 59 | Attending: Student in an Organized Health Care Education/Training Program | Admitting: Student in an Organized Health Care Education/Training Program

## 2023-02-14 DIAGNOSIS — R54 Age-related physical debility: Secondary | ICD-10-CM | POA: Diagnosis present

## 2023-02-14 DIAGNOSIS — F1020 Alcohol dependence, uncomplicated: Secondary | ICD-10-CM | POA: Diagnosis not present

## 2023-02-14 DIAGNOSIS — E1129 Type 2 diabetes mellitus with other diabetic kidney complication: Secondary | ICD-10-CM | POA: Diagnosis not present

## 2023-02-14 DIAGNOSIS — R2681 Unsteadiness on feet: Secondary | ICD-10-CM | POA: Diagnosis not present

## 2023-02-14 DIAGNOSIS — D649 Anemia, unspecified: Secondary | ICD-10-CM | POA: Diagnosis not present

## 2023-02-14 DIAGNOSIS — F17220 Nicotine dependence, chewing tobacco, uncomplicated: Secondary | ICD-10-CM | POA: Diagnosis not present

## 2023-02-14 DIAGNOSIS — Z8249 Family history of ischemic heart disease and other diseases of the circulatory system: Secondary | ICD-10-CM | POA: Diagnosis not present

## 2023-02-14 DIAGNOSIS — F1010 Alcohol abuse, uncomplicated: Secondary | ICD-10-CM | POA: Diagnosis not present

## 2023-02-14 DIAGNOSIS — E782 Mixed hyperlipidemia: Secondary | ICD-10-CM | POA: Diagnosis not present

## 2023-02-14 DIAGNOSIS — I6529 Occlusion and stenosis of unspecified carotid artery: Secondary | ICD-10-CM | POA: Diagnosis not present

## 2023-02-14 DIAGNOSIS — E1165 Type 2 diabetes mellitus with hyperglycemia: Secondary | ICD-10-CM | POA: Diagnosis not present

## 2023-02-14 DIAGNOSIS — S42292A Other displaced fracture of upper end of left humerus, initial encounter for closed fracture: Secondary | ICD-10-CM

## 2023-02-14 DIAGNOSIS — Z794 Long term (current) use of insulin: Secondary | ICD-10-CM | POA: Diagnosis not present

## 2023-02-14 DIAGNOSIS — S42202A Unspecified fracture of upper end of left humerus, initial encounter for closed fracture: Secondary | ICD-10-CM | POA: Diagnosis not present

## 2023-02-14 DIAGNOSIS — Z751 Person awaiting admission to adequate facility elsewhere: Secondary | ICD-10-CM

## 2023-02-14 DIAGNOSIS — I5032 Chronic diastolic (congestive) heart failure: Secondary | ICD-10-CM | POA: Diagnosis not present

## 2023-02-14 DIAGNOSIS — I10 Essential (primary) hypertension: Secondary | ICD-10-CM | POA: Diagnosis present

## 2023-02-14 DIAGNOSIS — S42309A Unspecified fracture of shaft of humerus, unspecified arm, initial encounter for closed fracture: Secondary | ICD-10-CM | POA: Diagnosis present

## 2023-02-14 DIAGNOSIS — E1122 Type 2 diabetes mellitus with diabetic chronic kidney disease: Secondary | ICD-10-CM | POA: Diagnosis not present

## 2023-02-14 DIAGNOSIS — Z8711 Personal history of peptic ulcer disease: Secondary | ICD-10-CM

## 2023-02-14 DIAGNOSIS — R780 Finding of alcohol in blood: Secondary | ICD-10-CM

## 2023-02-14 DIAGNOSIS — I4819 Other persistent atrial fibrillation: Secondary | ICD-10-CM | POA: Diagnosis not present

## 2023-02-14 DIAGNOSIS — Z9889 Other specified postprocedural states: Secondary | ICD-10-CM | POA: Diagnosis not present

## 2023-02-14 DIAGNOSIS — G40909 Epilepsy, unspecified, not intractable, without status epilepticus: Secondary | ICD-10-CM | POA: Diagnosis present

## 2023-02-14 DIAGNOSIS — Z7901 Long term (current) use of anticoagulants: Secondary | ICD-10-CM

## 2023-02-14 DIAGNOSIS — I130 Hypertensive heart and chronic kidney disease with heart failure and stage 1 through stage 4 chronic kidney disease, or unspecified chronic kidney disease: Secondary | ICD-10-CM | POA: Diagnosis present

## 2023-02-14 DIAGNOSIS — R0902 Hypoxemia: Secondary | ICD-10-CM | POA: Diagnosis not present

## 2023-02-14 DIAGNOSIS — Z8679 Personal history of other diseases of the circulatory system: Secondary | ICD-10-CM | POA: Diagnosis not present

## 2023-02-14 DIAGNOSIS — R296 Repeated falls: Secondary | ICD-10-CM | POA: Diagnosis present

## 2023-02-14 DIAGNOSIS — M6281 Muscle weakness (generalized): Secondary | ICD-10-CM | POA: Diagnosis not present

## 2023-02-14 DIAGNOSIS — K7030 Alcoholic cirrhosis of liver without ascites: Secondary | ICD-10-CM | POA: Diagnosis present

## 2023-02-14 DIAGNOSIS — S42352A Displaced comminuted fracture of shaft of humerus, left arm, initial encounter for closed fracture: Secondary | ICD-10-CM | POA: Diagnosis not present

## 2023-02-14 DIAGNOSIS — E785 Hyperlipidemia, unspecified: Secondary | ICD-10-CM | POA: Diagnosis present

## 2023-02-14 DIAGNOSIS — F1090 Alcohol use, unspecified, uncomplicated: Secondary | ICD-10-CM

## 2023-02-14 DIAGNOSIS — I4891 Unspecified atrial fibrillation: Secondary | ICD-10-CM | POA: Diagnosis not present

## 2023-02-14 DIAGNOSIS — S199XXA Unspecified injury of neck, initial encounter: Secondary | ICD-10-CM | POA: Diagnosis not present

## 2023-02-14 DIAGNOSIS — W19XXXA Unspecified fall, initial encounter: Principal | ICD-10-CM

## 2023-02-14 DIAGNOSIS — Z886 Allergy status to analgesic agent status: Secondary | ICD-10-CM

## 2023-02-14 DIAGNOSIS — F10129 Alcohol abuse with intoxication, unspecified: Secondary | ICD-10-CM | POA: Diagnosis present

## 2023-02-14 DIAGNOSIS — R0989 Other specified symptoms and signs involving the circulatory and respiratory systems: Secondary | ICD-10-CM | POA: Diagnosis not present

## 2023-02-14 DIAGNOSIS — Z8505 Personal history of malignant neoplasm of liver: Secondary | ICD-10-CM | POA: Diagnosis not present

## 2023-02-14 DIAGNOSIS — E1159 Type 2 diabetes mellitus with other circulatory complications: Secondary | ICD-10-CM | POA: Diagnosis present

## 2023-02-14 DIAGNOSIS — N170 Acute kidney failure with tubular necrosis: Secondary | ICD-10-CM | POA: Diagnosis not present

## 2023-02-14 DIAGNOSIS — Z66 Do not resuscitate: Secondary | ICD-10-CM | POA: Diagnosis present

## 2023-02-14 DIAGNOSIS — N1832 Chronic kidney disease, stage 3b: Secondary | ICD-10-CM | POA: Diagnosis not present

## 2023-02-14 DIAGNOSIS — M19012 Primary osteoarthritis, left shoulder: Secondary | ICD-10-CM | POA: Diagnosis not present

## 2023-02-14 DIAGNOSIS — R42 Dizziness and giddiness: Secondary | ICD-10-CM | POA: Diagnosis not present

## 2023-02-14 DIAGNOSIS — Y908 Blood alcohol level of 240 mg/100 ml or more: Secondary | ICD-10-CM | POA: Diagnosis present

## 2023-02-14 DIAGNOSIS — E871 Hypo-osmolality and hyponatremia: Secondary | ICD-10-CM | POA: Diagnosis present

## 2023-02-14 DIAGNOSIS — Z87898 Personal history of other specified conditions: Secondary | ICD-10-CM | POA: Diagnosis not present

## 2023-02-14 DIAGNOSIS — S0990XA Unspecified injury of head, initial encounter: Secondary | ICD-10-CM | POA: Diagnosis not present

## 2023-02-14 DIAGNOSIS — N179 Acute kidney failure, unspecified: Secondary | ICD-10-CM | POA: Diagnosis not present

## 2023-02-14 DIAGNOSIS — Z79899 Other long term (current) drug therapy: Secondary | ICD-10-CM

## 2023-02-14 DIAGNOSIS — K5900 Constipation, unspecified: Secondary | ICD-10-CM | POA: Diagnosis present

## 2023-02-14 DIAGNOSIS — Z87440 Personal history of urinary (tract) infections: Secondary | ICD-10-CM

## 2023-02-14 DIAGNOSIS — C220 Liver cell carcinoma: Secondary | ICD-10-CM | POA: Diagnosis not present

## 2023-02-14 DIAGNOSIS — S42352D Displaced comminuted fracture of shaft of humerus, left arm, subsequent encounter for fracture with routine healing: Secondary | ICD-10-CM | POA: Diagnosis not present

## 2023-02-14 LAB — COMPREHENSIVE METABOLIC PANEL
ALT: 16 U/L (ref 0–44)
AST: 31 U/L (ref 15–41)
Albumin: 2.7 g/dL — ABNORMAL LOW (ref 3.5–5.0)
Alkaline Phosphatase: 52 U/L (ref 38–126)
Anion gap: 12 (ref 5–15)
BUN: 29 mg/dL — ABNORMAL HIGH (ref 8–23)
CO2: 22 mmol/L (ref 22–32)
Calcium: 8 mg/dL — ABNORMAL LOW (ref 8.9–10.3)
Chloride: 99 mmol/L (ref 98–111)
Creatinine, Ser: 1.53 mg/dL — ABNORMAL HIGH (ref 0.44–1.00)
GFR, Estimated: 37 mL/min — ABNORMAL LOW (ref >60)
Glucose, Bld: 201 mg/dL — ABNORMAL HIGH (ref 70–99)
Potassium: 4.5 mmol/L (ref 3.5–5.1)
Sodium: 133 mmol/L — ABNORMAL LOW (ref 135–145)
Total Bilirubin: 0.7 mg/dL (ref 0.3–1.2)
Total Protein: 7.8 g/dL (ref 6.5–8.1)

## 2023-02-14 LAB — CBC WITH DIFFERENTIAL/PLATELET
Abs Immature Granulocytes: 0.02 10*3/uL (ref 0.00–0.07)
Basophils Absolute: 0 10*3/uL (ref 0.0–0.1)
Basophils Relative: 1 %
Eosinophils Absolute: 0 10*3/uL (ref 0.0–0.5)
Eosinophils Relative: 0 %
HCT: 34.6 % — ABNORMAL LOW (ref 36.0–46.0)
Hemoglobin: 11 g/dL — ABNORMAL LOW (ref 12.0–15.0)
Immature Granulocytes: 0 %
Lymphocytes Relative: 42 %
Lymphs Abs: 2.2 10*3/uL (ref 0.7–4.0)
MCH: 30.6 pg (ref 26.0–34.0)
MCHC: 31.8 g/dL (ref 30.0–36.0)
MCV: 96.1 fL (ref 80.0–100.0)
Monocytes Absolute: 0.5 10*3/uL (ref 0.1–1.0)
Monocytes Relative: 9 %
Neutro Abs: 2.5 10*3/uL (ref 1.7–7.7)
Neutrophils Relative %: 48 %
Platelets: 142 10*3/uL — ABNORMAL LOW (ref 150–400)
RBC: 3.6 MIL/uL — ABNORMAL LOW (ref 3.87–5.11)
RDW: 16.3 % — ABNORMAL HIGH (ref 11.5–15.5)
WBC: 5.1 10*3/uL (ref 4.0–10.5)
nRBC: 0 % (ref 0.0–0.2)

## 2023-02-14 LAB — CK
Total CK: 61 U/L (ref 38–234)
Total CK: 65 U/L (ref 38–234)

## 2023-02-14 LAB — GLUCOSE, CAPILLARY: Glucose-Capillary: 236 mg/dL — ABNORMAL HIGH (ref 70–99)

## 2023-02-14 LAB — LIPASE, BLOOD: Lipase: 68 U/L — ABNORMAL HIGH (ref 11–51)

## 2023-02-14 LAB — TROPONIN I (HIGH SENSITIVITY)
Troponin I (High Sensitivity): 5 ng/L (ref <18)
Troponin I (High Sensitivity): 6 ng/L (ref <18)

## 2023-02-14 LAB — ETHANOL: Alcohol, Ethyl (B): 252 mg/dL — ABNORMAL HIGH (ref <10)

## 2023-02-14 MED ORDER — THIAMINE HCL 100 MG/ML IJ SOLN
100.0000 mg | Freq: Every day | INTRAMUSCULAR | Status: DC
  Administered 2023-02-16: 100 mg via INTRAVENOUS
  Filled 2023-02-14 (×7): qty 2

## 2023-02-14 MED ORDER — FUROSEMIDE 20 MG PO TABS
20.0000 mg | ORAL_TABLET | ORAL | Status: DC
  Administered 2023-02-15: 20 mg via ORAL
  Filled 2023-02-14: qty 1

## 2023-02-14 MED ORDER — ACETAMINOPHEN 650 MG RE SUPP: 650.0000 mg | Freq: Four times a day (QID) | RECTAL | Status: DC | PRN

## 2023-02-14 MED ORDER — INSULIN ASPART 100 UNIT/ML IJ SOLN
0.0000 [IU] | Freq: Three times a day (TID) | INTRAMUSCULAR | Status: DC
  Administered 2023-02-15: 5 [IU] via SUBCUTANEOUS
  Filled 2023-02-14: qty 1

## 2023-02-14 MED ORDER — FOLIC ACID 1 MG PO TABS
1.0000 mg | ORAL_TABLET | Freq: Every day | ORAL | Status: DC
Start: 2023-02-14 — End: 2023-02-28
  Administered 2023-02-14 – 2023-02-28 (×15): 1 mg via ORAL
  Filled 2023-02-14 (×15): qty 1

## 2023-02-14 MED ORDER — ALLOPURINOL 100 MG PO TABS
100.0000 mg | ORAL_TABLET | Freq: Every day | ORAL | Status: DC
  Administered 2023-02-15 – 2023-02-28 (×14): 100 mg via ORAL
  Filled 2023-02-14 (×14): qty 1

## 2023-02-14 MED ORDER — MORPHINE SULFATE (PF) 2 MG/ML IV SOLN
2.0000 mg | INTRAVENOUS | Status: DC | PRN
  Administered 2023-02-15 – 2023-02-19 (×3): 2 mg via INTRAVENOUS
  Filled 2023-02-14 (×3): qty 1

## 2023-02-14 MED ORDER — METOPROLOL TARTRATE 25 MG PO TABS
25.0000 mg | ORAL_TABLET | Freq: Two times a day (BID) | ORAL | Status: DC
  Administered 2023-02-15 – 2023-02-18 (×8): 25 mg via ORAL
  Filled 2023-02-14 (×8): qty 1

## 2023-02-14 MED ORDER — ALBUTEROL SULFATE (2.5 MG/3ML) 0.083% IN NEBU: 2.5000 mg | INHALATION_SOLUTION | RESPIRATORY_TRACT | Status: DC | PRN

## 2023-02-14 MED ORDER — MIRTAZAPINE 15 MG PO TABS
7.5000 mg | ORAL_TABLET | Freq: Every day | ORAL | Status: DC
  Administered 2023-02-14 – 2023-02-27 (×14): 7.5 mg via ORAL
  Filled 2023-02-14 (×14): qty 1

## 2023-02-14 MED ORDER — APIXABAN 5 MG PO TABS
5.0000 mg | ORAL_TABLET | Freq: Two times a day (BID) | ORAL | Status: DC
  Administered 2023-02-14 – 2023-02-18 (×9): 5 mg via ORAL
  Filled 2023-02-14 (×9): qty 1

## 2023-02-14 MED ORDER — ESCITALOPRAM OXALATE 10 MG PO TABS
10.0000 mg | ORAL_TABLET | Freq: Every day | ORAL | Status: DC
  Administered 2023-02-14 – 2023-02-27 (×14): 10 mg via ORAL
  Filled 2023-02-14 (×14): qty 1

## 2023-02-14 MED ORDER — ADULT MULTIVITAMIN W/MINERALS CH
1.0000 | ORAL_TABLET | Freq: Every day | ORAL | Status: DC
  Administered 2023-02-14 – 2023-02-28 (×15): 1 via ORAL
  Filled 2023-02-14 (×15): qty 1

## 2023-02-14 MED ORDER — INSULIN ASPART 100 UNIT/ML IJ SOLN
0.0000 [IU] | Freq: Every day | INTRAMUSCULAR | Status: DC
  Administered 2023-02-14: 2 [IU] via SUBCUTANEOUS
  Filled 2023-02-14: qty 1

## 2023-02-14 MED ORDER — TRAZODONE HCL 50 MG PO TABS: 50.0000 mg | ORAL_TABLET | Freq: Every evening | ORAL | Status: DC | PRN

## 2023-02-14 MED ORDER — FENTANYL CITRATE PF 50 MCG/ML IJ SOSY
50.0000 ug | PREFILLED_SYRINGE | Freq: Once | INTRAMUSCULAR | Status: AC
  Administered 2023-02-14: 50 ug via INTRAVENOUS
  Filled 2023-02-14: qty 1

## 2023-02-14 MED ORDER — ONDANSETRON HCL 4 MG PO TABS: 4.0000 mg | ORAL_TABLET | Freq: Four times a day (QID) | ORAL | Status: DC | PRN

## 2023-02-14 MED ORDER — HYDROCODONE-ACETAMINOPHEN 5-325 MG PO TABS
1.0000 | ORAL_TABLET | ORAL | Status: DC | PRN
  Administered 2023-02-16 – 2023-02-22 (×5): 1 via ORAL
  Filled 2023-02-14 (×5): qty 1

## 2023-02-14 MED ORDER — THIAMINE MONONITRATE 100 MG PO TABS
100.0000 mg | ORAL_TABLET | Freq: Every day | ORAL | Status: DC
  Administered 2023-02-14 – 2023-02-28 (×14): 100 mg via ORAL
  Filled 2023-02-14 (×16): qty 1

## 2023-02-14 MED ORDER — LORAZEPAM 1 MG PO TABS: 1.0000 mg | ORAL_TABLET | ORAL | Status: AC | PRN

## 2023-02-14 MED ORDER — ONDANSETRON HCL 4 MG/2ML IJ SOLN
4.0000 mg | Freq: Once | INTRAMUSCULAR | Status: AC
  Administered 2023-02-14: 4 mg via INTRAVENOUS
  Filled 2023-02-14: qty 2

## 2023-02-14 MED ORDER — LORAZEPAM 2 MG/ML IJ SOLN: 1.0000 mg | INTRAMUSCULAR | Status: AC | PRN

## 2023-02-14 MED ORDER — ONDANSETRON HCL 4 MG/2ML IJ SOLN: 4.0000 mg | Freq: Four times a day (QID) | INTRAMUSCULAR | Status: DC | PRN

## 2023-02-14 MED ORDER — INSULIN GLARGINE-YFGN 100 UNIT/ML ~~LOC~~ SOLN
10.0000 [IU] | Freq: Every day | SUBCUTANEOUS | Status: DC
  Administered 2023-02-15 – 2023-02-22 (×8): 10 [IU] via SUBCUTANEOUS
  Filled 2023-02-14 (×9): qty 0.1

## 2023-02-14 MED ORDER — ACETAMINOPHEN 325 MG PO TABS
650.0000 mg | ORAL_TABLET | Freq: Four times a day (QID) | ORAL | Status: DC | PRN
  Administered 2023-02-16 – 2023-02-19 (×6): 650 mg via ORAL
  Filled 2023-02-14 (×6): qty 2

## 2023-02-14 MED ORDER — SPIRONOLACTONE 25 MG PO TABS
25.0000 mg | ORAL_TABLET | Freq: Every day | ORAL | Status: DC
  Administered 2023-02-15: 25 mg via ORAL
  Filled 2023-02-14: qty 1

## 2023-02-14 NOTE — Assessment & Plan Note (Signed)
HFpEF Clinically compensated Continue GDMT, spironolactone, metoprolol.  Continue Lasix as well

## 2023-02-14 NOTE — Assessment & Plan Note (Signed)
Sepsis secondary to E. coli in August 2024 Will get urinalysis to evaluate for UTI as possible contributor to fall

## 2023-02-14 NOTE — Assessment & Plan Note (Signed)
Chronic anticoagulation History of unsuccessful DCCV in September 2024 Continue metoprolol and apixaban Last seen by cardiology 10/7

## 2023-02-14 NOTE — Assessment & Plan Note (Signed)
Accidental fall versus fall related to alcohol intoxication Left arm in sling Pain control Ortho consult PT and TOC eval

## 2023-02-14 NOTE — ED Triage Notes (Addendum)
Patient to ED via [facility] from home. Patient fell beside her bed trying to walk into her closet. The maintenance man called EMS after coming to the patient's home for some repairs and seeing her laying the floor. The patient does state she drank some alcohol today. Patient lives alone. Patient has Hx of diabetes. Patient also states she takes a BP medication for hypertension which she was able to take today. Patient is reporting L shoulder pain since the fall. Patient also reports vomiting 3-4 times while laying on the floor.

## 2023-02-14 NOTE — H&P (Signed)
History and Physical    Patient: Adriana Miller CZY:[id number] DOB: May 10, 1955 DOA: 02/14/2023 DOS: the patient was seen and examined on 02/14/2023 PCP: Carlean Jews, PA-C  Patient coming from: Home  Chief Complaint:  Chief Complaint  Patient presents with   Fall    Shoulder pain    HPI: [name] is a 67 y.o. female with medical history significant for Persistent PAF/flutter on apixaban s/p failed DCCV 12/27/2022, DM2, CKD stage III-IV, cirrhosis  and hepatocellular carcinoma, seizure disorder, cerebral aneurysm status post repair, HTN, and HLD , as well as daily  frailty and frequent falls, prior UTIs with sepsis, most recently 11/2022, who presents to the EDFor fall resulting in injury to her left shoulder and inability to get up.  According to the triage nurse, patient was found by a maintenance man  beside her bed who came to do repairs and saw her lying on the floor.  She reported having 3 episodes of vomiting while lying on the floor.  She was previously in her usual state of health, and denied preceding chest pain, shortness of breath, palpitations or lightheadedness.  Emesis was nonbloody, nonbilious.  Has a prior history of GI bleed secondary to hypertensive gastropathy.  Denies blood in the stool or black stool.  Denies dysuria, abdominal pain, recent cough, fever or chills. ED course and data review: Vitals within normal limits Labs: WBC WNL hemoglobin at baseline at 11. Creatinine 1.53 which is above baseline, blood glucose elevated at 201 Troponin 6 CK 65 EtOH level 252 Lipase 68 with normal LFTs EKG, personally viewed and interpreted showing A-fib at 86 with no acute ST-T wave changes CT head and C-spine nonacute, showing unchanged cervical spondylosis. Left shoulder x-ray showing proximal humeral fracture and chest x-ray clear (preliminary report Admission requested for pain control and concern for safe discharge options as patient lives alone   Review of  Systems: As mentioned in the history of present illness. All other systems reviewed and are negative.  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)    B12 deficiency 04/05/2017   Blood in stool    Cerebral aneurysm    Chronic kidney disease    Diabetes mellitus without complication (HCC)    type 2   Gastric erosion with bleeding    Hepatocellular carcinoma (HCC)    Hyperlipidemia    Hypertension    Lower extremity cellulitis 02/08/2019   Paroxysmal A-fib (HCC)    Seizure disorder (HCC)    Past Surgical History:  Procedure Laterality Date   CARDIOVERSION N/A 12/27/2022   Procedure: CARDIOVERSION;  Surgeon: Debbe Odea, MD;  Location: [facility] ORS;  Service: Cardiovascular;  Laterality: N/A;   CEREBRAL ANEURYSM REPAIR  1991   COLONOSCOPY WITH PROPOFOL N/A 02/12/2019   Procedure: COLONOSCOPY WITH PROPOFOL;  Surgeon: Toney Reil, MD;  Location: [facility] ENDOSCOPY;  Service: Gastroenterology;  Laterality: N/A;   ESOPHAGOGASTRODUODENOSCOPY (EGD) WITH PROPOFOL N/A 02/12/2019   Procedure: ESOPHAGOGASTRODUODENOSCOPY (EGD) WITH PROPOFOL;  Surgeon: Toney Reil, MD;  Location: [facility] ENDOSCOPY;  Service: Gastroenterology;  Laterality: N/A;   IR ANGIOGRAM SELECTIVE EACH ADDITIONAL VESSEL  09/21/2019   IR ANGIOGRAM SELECTIVE EACH ADDITIONAL VESSEL  09/21/2019   IR ANGIOGRAM SELECTIVE EACH ADDITIONAL VESSEL  09/21/2019   IR ANGIOGRAM SELECTIVE EACH ADDITIONAL VESSEL  09/21/2019   IR ANGIOGRAM SELECTIVE EACH ADDITIONAL VESSEL  10/02/2019   IR ANGIOGRAM VISCERAL SELECTIVE  09/21/2019   IR ANGIOGRAM VISCERAL SELECTIVE  09/21/2019  IR ANGIOGRAM VISCERAL SELECTIVE  10/02/2019   IR EMBO TUMOR ORGAN ISCHEMIA INFARCT INC GUIDE ROADMAPPING  10/02/2019   IR EMBO TUMOR ORGAN ISCHEMIA INFARCT INC GUIDE ROADMAPPING  10/02/2019   IR RADIOLOGIST EVAL & MGMT  08/28/2019   IR RADIOLOGIST EVAL & MGMT  01/30/2020   IR RADIOLOGIST EVAL & MGMT  04/30/2020   IR RADIOLOGIST  EVAL & MGMT  07/30/2020   IR RADIOLOGIST EVAL & MGMT  10/28/2020   IR RADIOLOGIST EVAL & MGMT  02/24/2021   IR RADIOLOGIST EVAL & MGMT  06/08/2021   IR RADIOLOGIST EVAL & MGMT  11/24/2021   IR RADIOLOGIST EVAL & MGMT  05/25/2022   IR US GUIDE VASC ACCESS LEFT  09/21/2019   IR US GUIDE VASC ACCESS LEFT  10/02/2019   OPEN REDUCTION INTERNAL FIXATION (ORIF) DISTAL RADIAL FRACTURE Right 08/06/2021   Procedure: OPEN REDUCTION INTERNAL FIXATION (ORIF) DISTAL RADIAL FRACTURE;  Surgeon: Kennedy Bucker, MD;  Location: [facility] ORS;  Service: Orthopedics;  Laterality: Right;   Social History:  reports that she has never smoked. Her smokeless tobacco use includes snuff. She reports current alcohol use of about 5.0 standard drinks of alcohol per week. She reports that she does not use drugs.  Allergies  Allergen Reactions   Aspirin Other (See Comments)    Nose bleed    Family History  Problem Relation Age of Onset   Heart attack Brother 79    Prior to Admission medications   Medication Sig Start Date End Date Taking? Authorizing Provider  acetaminophen (TYLENOL) 325 MG tablet Take 2 tablets (650 mg total) by mouth every 6 (six) hours as needed for mild pain (or Fever >/= 101). 06/19/22   Loyce Dys, MD  Alcohol Swabs (B-D SINGLE USE SWABS REGULAR) PADS USE AS DIRECTED TWICE DAILY 07/26/22   McDonough, Salomon Fick, PA-C  allopurinol (ZYLOPRIM) 100 MG tablet Take 1 tablet (100 mg total) by mouth daily. 12/31/22   McDonough, Salomon Fick, PA-C  apixaban (ELIQUIS) 5 MG TABS tablet Take 1 tablet (5 mg total) by mouth 2 (two) times daily. 12/31/22   End, Cristal Deer, MD  Blood Glucose Monitoring Suppl (ACCU-CHEK GUIDE ME) w/Device KIT Use as directed  dx E11.65 07/30/22   McDonough, Salomon Fick, PA-C  Calcium Carb-Cholecalciferol (CALCIUM 500/D) 500-5 MG-MCG TABS Take 1 tab po daily 12/31/22   McDonough, Salomon Fick, PA-C  Continuous Blood Gluc Receiver (DEXCOM G7 RECEIVER) DEVI Use for continuous blood glucose monitoring  DX E11.65  11/09/21   McDonough, Lauren K, PA-C  Continuous Glucose Sensor (DEXCOM G7 SENSOR) MISC USE AS DIRECTED FOR CONTINUOUS  GLUCOSE MONITORING. CHANGE  SENSOR EVERY 10 DAYS 10/06/22   McDonough, Salomon Fick, PA-C  escitalopram (LEXAPRO) 10 MG tablet Take 1 tablet daily for anxiety/sleep. 12/31/22   McDonough, Salomon Fick, PA-C  folic acid (FOLVITE) 1 MG tablet Take 1 tablet (1 mg total) by mouth daily. 12/31/22   McDonough, Salomon Fick, PA-C  furosemide (LASIX) 20 MG tablet TAKE 1 TABLET BY MOUTH EVERY DAY 01/04/23   McDonough, Salomon Fick, PA-C  glucose blood (ACCU-CHEK GUIDE) test strip Use as instructed twice a day DX E11.65 08/03/22   McDonough, Lauren K, PA-C  ibuprofen (ADVIL) 200 MG tablet Take 200 mg by mouth as needed for headache or moderate pain.    [provider]  insulin glargine, 1 Unit Dial, (TOUJEO SOLOSTAR) 300 UNIT/ML Solostar Pen Inject 10 Units into the skin daily with supper. 12/31/22   McDonough, Salomon Fick, PA-C  Insulin  Pen Needle (BD PEN NEEDLE NANO 2ND GEN) 32G X 4 MM MISC USE AS DIRECTED WITH TOUJEO 12/31/22   McDonough, Lauren K, PA-C  metoprolol tartrate (LOPRESSOR) 25 MG tablet Take 1 tablet (25 mg total) by mouth 2 (two) times daily. 01/24/23   Dunn, Raymon Mutton, PA-C  mirtazapine (REMERON) 7.5 MG tablet TAKE 1 TABLET BY MOUTH DAILY AT  BEDTIME 12/31/22   McDonough, Lauren K, PA-C  potassium chloride (KLOR-CON) 8 MEQ tablet TAKE 1 TABLET BY MOUTH EVERY DAY 01/28/23   End, Cristal Deer, MD  Safety Lancets 28G MISC Use as directed once a daily Dx e11.65 07/30/22   McDonough, Salomon Fick, PA-C  spironolactone (ALDACTONE) 25 MG tablet Take 1 tablet (25 mg total) by mouth daily. 12/31/22   McDonough, Salomon Fick, PA-C  thiamine (VITAMIN B-1) 100 MG tablet Take 1 tablet (100 mg total) by mouth daily. 01/04/23   McDonough, Salomon Fick, PA-C  traZODone (DESYREL) 50 MG tablet TAKE 1 TO 2 TABLETS BY MOUTH AT  BEDTIME FOR INSOMNIA 12/31/22   Carlean Jews, PA-C    Physical Exam: Vitals:   02/14/23 1846  02/14/23 1900 02/14/23 1911 02/14/23 1930  BP: 115/69 109/64  100/68  Pulse: 95 86  99  Resp:  12  13  Temp:   98 F (36.7 C)   TempSrc:      SpO2:  99%  100%  Weight:      Height:       Physical Exam Vitals and nursing note reviewed.  Constitutional:      General: She is not in acute distress. HENT:     Head: Normocephalic and atraumatic.  Cardiovascular:     Rate and Rhythm: Normal rate and regular rhythm.     Heart sounds: Normal heart sounds.  Pulmonary:     Effort: Pulmonary effort is normal.     Breath sounds: Normal breath sounds.  Abdominal:     Palpations: Abdomen is soft.     Tenderness: There is no abdominal tenderness.  Musculoskeletal:     Comments: Left arm in sling  Neurological:     Mental Status: Mental status is at baseline.     Labs on Admission: I have personally reviewed following labs and imaging studies  CBC: Recent Labs  Lab 02/14/23 1530  WBC 5.1  NEUTROABS 2.5  HGB 11.0*  HCT 34.6*  MCV 96.1  PLT 142*   Basic Metabolic Panel: Recent Labs  Lab 02/14/23 1530  NA 133*  K 4.5  CL 99  CO2 22  GLUCOSE 201*  BUN 29*  CREATININE 1.53*  CALCIUM 8.0*   GFR: Estimated Creatinine Clearance: 37.4 mL/min (A) (by C-G formula based on SCr of 1.53 mg/dL (H)). Liver Function Tests: Recent Labs  Lab 02/14/23 1530  AST 31  ALT 16  ALKPHOS 52  BILITOT 0.7  PROT 7.8  ALBUMIN 2.7*   Recent Labs  Lab 02/14/23 1530  LIPASE 68*   No results for input(s): "AMMONIA" in the last 168 hours. Coagulation Profile: No results for input(s): "INR", "PROTIME" in the last 168 hours. Cardiac Enzymes: Recent Labs  Lab 02/14/23 1530 02/14/23 1848  CKTOTAL 61 65   BNP (last 3 results) No results for input(s): "PROBNP" in the last 8760 hours. HbA1C: No results for input(s): "HGBA1C" in the last 72 hours. CBG: No results for input(s): "GLUCAP" in the last 168 hours. Lipid Profile: No results for input(s): "CHOL", "HDL", "LDLCALC", "TRIG",  "CHOLHDL", "LDLDIRECT" in the last 72 hours. Thyroid  Function Tests: No results for input(s): "TSH", "T4TOTAL", "FREET4", "T3FREE", "THYROIDAB" in the last 72 hours. Anemia Panel: No results for input(s): "VITAMINB12", "FOLATE", "FERRITIN", "TIBC", "IRON", "RETICCTPCT" in the last 72 hours. Urine analysis:    Component Value Date/Time   COLORURINE YELLOW (A) 12/08/2022 2351   APPEARANCEUR CLEAR (A) 12/08/2022 2351   APPEARANCEUR Hazy 10/11/2013 1705   LABSPEC 1.009 12/08/2022 2351   LABSPEC 1.012 10/11/2013 1705   PHURINE 5.0 12/08/2022 2351   GLUCOSEU NEGATIVE 12/08/2022 2351   GLUCOSEU >=500 10/11/2013 1705   HGBUR SMALL (A) 12/08/2022 2351   BILIRUBINUR NEGATIVE 12/08/2022 2351   BILIRUBINUR Negative 10/11/2013 1705   KETONESUR NEGATIVE 12/08/2022 2351   PROTEINUR NEGATIVE 12/08/2022 2351   NITRITE NEGATIVE 12/08/2022 2351   LEUKOCYTESUR NEGATIVE 12/08/2022 2351   LEUKOCYTESUR 1+ 10/11/2013 1705    Radiological Exams on Admission: CT HEAD WO CONTRAST ( )  Result Date: 02/14/2023 CLINICAL DATA:  Head trauma, minor (Age >= 65y); Neck trauma (Age >= 65y). Fall. EXAM: CT HEAD WITHOUT CONTRAST CT CERVICAL SPINE WITHOUT CONTRAST TECHNIQUE: Multidetector CT imaging of the head and cervical spine was performed following the standard protocol without intravenous contrast. Multiplanar CT image reconstructions of the cervical spine were also generated. RADIATION DOSE REDUCTION: This exam was performed according to the departmental dose-optimization program which includes automated exposure control, adjustment of the mA and/or kV according to patient size and/or use of iterative reconstruction technique. COMPARISON:  Head CT 11/17/2022.  CT cervical spine 09/22/2022. FINDINGS: CT HEAD FINDINGS Brain: No acute hemorrhage. 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 Cortical gray-white  differentiation is otherwise preserved. Prominence of the ventricles and sulci within expected range for age. No hydrocephalus or extra-axial collection. No mass effect or midline shift. Vascular: No hyperdense vessel or unexpected calcification. Skull: Prior right frontoparietal craniotomy. No calvarial fracture or suspicious bone lesion. Skull base is unremarkable. Sinuses/Orbits: No acute finding. Other: None. CT CERVICAL SPINE FINDINGS Alignment: Normal. Skull base and vertebrae: No acute fracture. Normal craniocervical junction. No suspicious bone lesions. Soft tissues and spinal canal: No prevertebral fluid or swelling. No visible canal hematoma. Disc levels: Unchanged multilevel cervical spondylosis, worst at C3-4, where there is at least moderate spinal canal stenosis. Upper chest: No acute findings. Other: Atherosclerotic calcifications of the carotid bulbs. IMPRESSION: 1. No acute intracranial abnormality. 2. No acute cervical spine fracture or traumatic listhesis. 3. Unchanged multilevel cervical spondylosis, worst at C3-4, where there is at least moderate spinal canal stenosis. Electronically Signed   By: Orvan Falconer M.D.   On: 02/14/2023 18:22   CT Cervical Spine Wo Contrast  Result Date: 02/14/2023 CLINICAL DATA:  Head trauma, minor (Age >= 65y); Neck trauma (Age >= 65y). Fall. EXAM: CT HEAD WITHOUT CONTRAST CT CERVICAL SPINE WITHOUT CONTRAST TECHNIQUE: Multidetector CT imaging of the head and cervical spine was performed following the standard protocol without intravenous contrast. Multiplanar CT image reconstructions of the cervical spine were also generated. RADIATION DOSE REDUCTION: This exam was performed according to the departmental dose-optimization program which includes automated exposure control, adjustment of the mA and/or kV according to patient size and/or use of iterative reconstruction technique. COMPARISON:  Head CT 11/17/2022.  CT cervical spine 09/22/2022. FINDINGS: CT HEAD  FINDINGS Brain: No acute hemorrhage. 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 Cortical gray-white differentiation is otherwise preserved.  Prominence of the ventricles and sulci within expected range for age. No hydrocephalus or extra-axial collection. No mass effect or midline shift. Vascular: No hyperdense vessel or unexpected calcification. Skull: Prior right frontoparietal craniotomy. No calvarial fracture or suspicious bone lesion. Skull base is unremarkable. Sinuses/Orbits: No acute finding. Other: None. CT CERVICAL SPINE FINDINGS Alignment: Normal. Skull base and vertebrae: No acute fracture. Normal craniocervical junction. No suspicious bone lesions. Soft tissues and spinal canal: No prevertebral fluid or swelling. No visible canal hematoma. Disc levels: Unchanged multilevel cervical spondylosis, worst at C3-4, where there is at least moderate spinal canal stenosis. Upper chest: No acute findings. Other: Atherosclerotic calcifications of the carotid bulbs. IMPRESSION: 1. No acute intracranial abnormality. 2. No acute cervical spine fracture or traumatic listhesis. 3. Unchanged multilevel cervical spondylosis, worst at C3-4, where there is at least moderate spinal canal stenosis. Electronically Signed   By: Orvan Falconer M.D.   On: 02/14/2023 18:22     Data Reviewed: Relevant notes from primary care and specialist visits, past discharge summaries as available in EHR, including Care Everywhere. Prior diagnostic testing as pertinent to current admission diagnoses Updated medications and problem lists for reconciliation ED course, including vitals, labs, imaging, treatment and response to treatment Triage notes, nursing and pharmacy notes and ED provider's notes Notable results as noted in HPI   Assessment and Plan: * Comminuted left humeral fracture, closed, initial encounter Accidental fall  versus fall related to alcohol intoxication Left arm in sling Pain control Ortho consult PT and TOC eval   Elevated ETOH level Alcohol use disorder with possible acute intoxication Alcoholic cirrhosis without ascites EtOH level over 200 on arrival Alcohol cessation counseling CIWA withdrawal protocol  History of recurrent UTI (urinary tract infection) Sepsis secondary to E. coli in August 2024 Will get urinalysis to evaluate for UTI as possible contributor to fall  Persistent atrial fibrillation (HCC) Chronic anticoagulation History of unsuccessful DCCV in September 2024 Continue metoprolol and apixaban Last seen by cardiology 10/7  Essential hypertension HFpEF Clinically compensated Continue GDMT, spironolactone, metoprolol.  Continue Lasix as well  Uncontrolled type 2 diabetes mellitus with hyperglycemia, with long-term current use of insulin (HCC) Blood sugar over 200 Continue basal insulin with sliding scale coverage  Stage 3b chronic kidney disease (HCC) Renal function at baseline  History of cerebral aneurysm repair No acute issues suspected.  Head CT was nonacute    DVT prophylaxis: apixaban  Consults: ortho  Advance Care Planning:   Code Status: Prior   Family Communication: none  Disposition Plan: Back to previous home environment  Severity of Illness: The appropriate patient status for this patient is INPATIENT. Inpatient status is judged to be reasonable and necessary in order to provide the required intensity of service to ensure the patient's safety. The patient's presenting symptoms, physical exam findings, and initial radiographic and laboratory data in the context of their chronic comorbidities is felt to place them at high risk for further clinical deterioration. Furthermore, it is not anticipated that the patient will be medically stable for discharge from the hospital within 2 midnights of admission.   * I certify that at the point of admission  it is my clinical judgment that the patient will require inpatient hospital care spanning beyond 2 midnights from the point of admission due to high intensity of service, high risk for further deterioration and high frequency of surveillance required.*  Author: Andris Baumann, MD 02/14/2023 8:00 PM  For on call review [email or web address].

## 2023-02-14 NOTE — Assessment & Plan Note (Signed)
Alcohol use disorder with possible acute intoxication Alcoholic cirrhosis without ascites EtOH level over 200 on arrival Alcohol cessation counseling CIWA withdrawal protocol

## 2023-02-14 NOTE — ED Provider Notes (Signed)
[facility] Provider Note    Event Date/Time   First MD Initiated Contact with Patient 02/14/23 1530     (approximate)   History   Fall (Shoulder pain)   HPI  Adriana Miller is a 67 y.o. female who comes in from home patient fell beside her bed tried to walk into the closet.  Unclear how long she was on the ground for.  Patient does report drinking some alcohol today.  She does live alone.  Has history of diabetes.  Patient states that she did not have any LOC that she is aware of.  She reports only having pain in her left shoulder.  She denies any other pain.  Patient is on Eliquis for atrial fibrillation.  She reports drinking only 1 beer.  She states that she does not drink every day but just sometimes when she gets bored.  Physical Exam   Triage Vital Signs: ED Triage Vitals  Encounter Vitals Group     BP 02/14/23 1523 119/77     Systolic BP Percentile --      Diastolic BP Percentile --      Pulse Rate 02/14/23 1523 85     Resp 02/14/23 1523 16     Temp 02/14/23 1523 97.7 F (36.5 C)     Temp Source 02/14/23 1523 Oral     SpO2 02/14/23 1522 100 %     Weight 02/14/23 1525 180 lb (81.6 kg)     Height 02/14/23 1525 5\' 6"  (1.676 m)     Head Circumference --      Peak Flow --      Pain Score 02/14/23 1525 9     Pain Loc --      Pain Education --      Exclude from Growth Chart --     Most recent vital signs: Vitals:   02/14/23 1522 02/14/23 1523  BP:  119/77  Pulse:  85  Resp:  16  Temp:  97.7 F (36.5 C)  SpO2: 100% 100%     General: Awake, no distress.  CV:  Good peripheral perfusion.  Resp:  Normal effort.  Abd:  No distention.  Other:  Deformity noted to the left shoulder good distal pulse.  No abdominal tenderness.  No obvious chest tenderness.  Neuro intact in the left arm.   ED Results / Procedures / Treatments   Labs (all labs ordered are listed, but only abnormal results are displayed) Labs Reviewed  CBC WITH  DIFFERENTIAL/PLATELET  COMPREHENSIVE METABOLIC PANEL  CK  LIPASE, BLOOD  URINALYSIS, ROUTINE W REFLEX MICROSCOPIC  ETHANOL  URINE DRUG SCREEN, QUALITATIVE ([facility] ONLY)  TROPONIN I (HIGH SENSITIVITY)     EKG  My interpretation of EKG:  Atrial fibrillation rate of 86 without any ST elevation or T wave inversions, normal intervals  RADIOLOGY I have reviewed the xray personally and interpreted and patient has a left shoulder fracture.  PROCEDURES:  Critical Care performed: No  .1-3 Lead EKG Interpretation  Performed by: Concha Se, MD Authorized by: Concha Se, MD     Interpretation: abnormal     ECG rate:  80   ECG rate assessment: normal     Rhythm: atrial fibrillation     Ectopy: none     Conduction: normal      MEDICATIONS ORDERED IN ED: Medications  fentaNYL (SUBLIMAZE) injection 50 mcg (50 mcg Intravenous Given 02/14/23 1642)  ondansetron (ZOFRAN) injection 4 mg (4 mg Intravenous Given  02/14/23 1642)     IMPRESSION / MDM / ASSESSMENT AND PLAN / ED COURSE  I reviewed the triage vital signs and the nursing notes.   Patient's presentation is most consistent with acute presentation with potential threat to life or bodily function.   Differentials intracranial hemorrhage, cervical fracture, shoulder fracture, dislocation.  Will get x-rays, CT, basic labs to further evaluate.  Patient given some IV fentanyl for pain control.  No open wounds to suggest needing Tdap.  EtOH is elevated at 252.  Labs show stable hemoglobin.  CMP shows stable creatinine.  Troponin was negative CK normal lipase slightly elevated but she has no abdominal tenderness.  X-ray confirms humerus fracture will place in sling  CT imaging was negative.  Discussed with patient she lives by herself she uses a walker to ambulate occasionally at home and occasionally her cane.  I feel like given the EtOH abuse in conjunction with new shoulder fracture and living by herself patient will need to  be admitted for potential placement, pain control, PT OT.  Patient felt more comfortable with this plan.  The patient is on the cardiac monitor to evaluate for evidence of arrhythmia and/or significant heart rate changes.      FINAL CLINICAL IMPRESSION(S) / ED DIAGNOSES   Final diagnoses:  Fall, initial encounter  ETOH abuse  Other closed displaced fracture of proximal end of left humerus, initial encounter     Rx / DC Orders   ED Discharge Orders     None        Note:  This document was prepared using Dragon voice recognition software and may include unintentional dictation errors.   Concha Se, MD 02/14/23 [phone]

## 2023-02-14 NOTE — Assessment & Plan Note (Signed)
Blood sugar over 200 Continue basal insulin with sliding scale coverage

## 2023-02-14 NOTE — Assessment & Plan Note (Signed)
No acute issues suspected.  Head CT was nonacute

## 2023-02-14 NOTE — Assessment & Plan Note (Signed)
 Renal function at baseline

## 2023-02-15 DIAGNOSIS — F1090 Alcohol use, unspecified, uncomplicated: Secondary | ICD-10-CM

## 2023-02-15 DIAGNOSIS — K7030 Alcoholic cirrhosis of liver without ascites: Secondary | ICD-10-CM | POA: Diagnosis not present

## 2023-02-15 DIAGNOSIS — W19XXXA Unspecified fall, initial encounter: Secondary | ICD-10-CM | POA: Diagnosis not present

## 2023-02-15 DIAGNOSIS — S42352A Displaced comminuted fracture of shaft of humerus, left arm, initial encounter for closed fracture: Secondary | ICD-10-CM | POA: Diagnosis not present

## 2023-02-15 LAB — BASIC METABOLIC PANEL
Anion gap: 10 (ref 5–15)
BUN: 33 mg/dL — ABNORMAL HIGH (ref 8–23)
CO2: 24 mmol/L (ref 22–32)
Calcium: 8.2 mg/dL — ABNORMAL LOW (ref 8.9–10.3)
Chloride: 101 mmol/L (ref 98–111)
Creatinine, Ser: 1.56 mg/dL — ABNORMAL HIGH (ref 0.44–1.00)
GFR, Estimated: 36 mL/min — ABNORMAL LOW (ref >60)
Glucose, Bld: 195 mg/dL — ABNORMAL HIGH (ref 70–99)
Potassium: 4.6 mmol/L (ref 3.5–5.1)
Sodium: 135 mmol/L (ref 135–145)

## 2023-02-15 LAB — CBC
HCT: 31.3 % — ABNORMAL LOW (ref 36.0–46.0)
Hemoglobin: 10.2 g/dL — ABNORMAL LOW (ref 12.0–15.0)
MCH: 30.8 pg (ref 26.0–34.0)
MCHC: 32.6 g/dL (ref 30.0–36.0)
MCV: 94.6 fL (ref 80.0–100.0)
Platelets: 146 10*3/uL — ABNORMAL LOW (ref 150–400)
RBC: 3.31 MIL/uL — ABNORMAL LOW (ref 3.87–5.11)
RDW: 16.1 % — ABNORMAL HIGH (ref 11.5–15.5)
WBC: 7.5 10*3/uL (ref 4.0–10.5)
nRBC: 0 % (ref 0.0–0.2)

## 2023-02-15 LAB — GLUCOSE, CAPILLARY
Glucose-Capillary: 203 mg/dL — ABNORMAL HIGH (ref 70–99)
Glucose-Capillary: 212 mg/dL — ABNORMAL HIGH (ref 70–99)
Glucose-Capillary: 191 mg/dL — ABNORMAL HIGH (ref 70–99)
Glucose-Capillary: 206 mg/dL — ABNORMAL HIGH (ref 70–99)

## 2023-02-15 LAB — URINALYSIS, COMPLETE (UACMP) WITH MICROSCOPIC
Bilirubin Urine: NEGATIVE
Glucose, UA: NEGATIVE mg/dL
Ketones, ur: NEGATIVE mg/dL
Nitrite: NEGATIVE
Protein, ur: NEGATIVE mg/dL
Specific Gravity, Urine: 1.011 (ref 1.005–1.030)
pH: 5 (ref 5.0–8.0)

## 2023-02-15 LAB — URINE DRUG SCREEN, QUALITATIVE (ARMC ONLY)
Amphetamines, Ur Screen: NOT DETECTED
Barbiturates, Ur Screen: NOT DETECTED
Benzodiazepine, Ur Scrn: NOT DETECTED
Cannabinoid 50 Ng, Ur ~~LOC~~: NOT DETECTED
Cocaine Metabolite,Ur ~~LOC~~: NOT DETECTED
MDMA (Ecstasy)Ur Screen: NOT DETECTED
Methadone Scn, Ur: NOT DETECTED
Opiate, Ur Screen: NOT DETECTED
Phencyclidine (PCP) Ur S: NOT DETECTED
Tricyclic, Ur Screen: NOT DETECTED

## 2023-02-15 MED ORDER — INSULIN ASPART 100 UNIT/ML IJ SOLN
0.0000 [IU] | Freq: Every day | INTRAMUSCULAR | Status: DC
  Administered 2023-02-15 – 2023-02-20 (×5): 2 [IU] via SUBCUTANEOUS
  Filled 2023-02-15 (×5): qty 1

## 2023-02-15 MED ORDER — INSULIN ASPART 100 UNIT/ML IJ SOLN
0.0000 [IU] | Freq: Three times a day (TID) | INTRAMUSCULAR | Status: DC
  Administered 2023-02-15: 3 [IU] via SUBCUTANEOUS
  Administered 2023-02-15 – 2023-02-16 (×2): 2 [IU] via SUBCUTANEOUS
  Administered 2023-02-16 (×2): 3 [IU] via SUBCUTANEOUS
  Administered 2023-02-17: 2 [IU] via SUBCUTANEOUS
  Administered 2023-02-17: 5 [IU] via SUBCUTANEOUS
  Administered 2023-02-17 – 2023-02-18 (×2): 3 [IU] via SUBCUTANEOUS
  Administered 2023-02-18: 5 [IU] via SUBCUTANEOUS
  Administered 2023-02-18 – 2023-02-19 (×3): 3 [IU] via SUBCUTANEOUS
  Administered 2023-02-19: 5 [IU] via SUBCUTANEOUS
  Administered 2023-02-20: 3 [IU] via SUBCUTANEOUS
  Administered 2023-02-20: 1 [IU] via SUBCUTANEOUS
  Administered 2023-02-20: 7 [IU] via SUBCUTANEOUS
  Administered 2023-02-21 (×2): 3 [IU] via SUBCUTANEOUS
  Administered 2023-02-22: 2 [IU] via SUBCUTANEOUS
  Administered 2023-02-22: 3 [IU] via SUBCUTANEOUS
  Administered 2023-02-22 – 2023-02-23 (×2): 1 [IU] via SUBCUTANEOUS
  Administered 2023-02-23: 2 [IU] via SUBCUTANEOUS
  Administered 2023-02-23: 1 [IU] via SUBCUTANEOUS
  Administered 2023-02-24: 5 [IU] via SUBCUTANEOUS
  Administered 2023-02-24: 3 [IU] via SUBCUTANEOUS
  Administered 2023-02-24: 2 [IU] via SUBCUTANEOUS
  Administered 2023-02-25: 5 [IU] via SUBCUTANEOUS
  Administered 2023-02-25 (×2): 2 [IU] via SUBCUTANEOUS
  Administered 2023-02-26: 5 [IU] via SUBCUTANEOUS
  Administered 2023-02-26: 2 [IU] via SUBCUTANEOUS
  Administered 2023-02-26: 3 [IU] via SUBCUTANEOUS
  Administered 2023-02-27: 7 [IU] via SUBCUTANEOUS
  Administered 2023-02-27: 1 [IU] via SUBCUTANEOUS
  Administered 2023-02-27: 3 [IU] via SUBCUTANEOUS
  Administered 2023-02-28: 1 [IU] via SUBCUTANEOUS
  Administered 2023-02-28: 3 [IU] via SUBCUTANEOUS
  Filled 2023-02-15 (×39): qty 1

## 2023-02-15 MED ORDER — SODIUM CHLORIDE 0.9 % IV SOLN
1.0000 g | INTRAVENOUS | Status: DC
  Administered 2023-02-15 – 2023-02-16 (×2): 1 g via INTRAVENOUS
  Filled 2023-02-15 (×2): qty 10

## 2023-02-15 NOTE — Plan of Care (Signed)
  Problem: Fluid Volume: Goal: Hemodynamic stability will improve Outcome: Progressing   Problem: Clinical Measurements: Goal: Diagnostic test results will improve Outcome: Progressing Goal: Signs and symptoms of infection will decrease Outcome: Progressing   Problem: Respiratory: Goal: Ability to maintain adequate ventilation will improve Outcome: Progressing   Problem: Education: Goal: Knowledge of General Education information will improve Description: Including pain rating scale, medication(s)/side effects and non-pharmacologic comfort measures Outcome: Progressing   Problem: Health Behavior/Discharge Planning: Goal: Ability to manage health-related needs will improve Outcome: Progressing   Problem: Clinical Measurements: Goal: Ability to maintain clinical measurements within normal limits will improve Outcome: Progressing Goal: Will remain free from infection Outcome: Progressing Goal: Diagnostic test results will improve Outcome: Progressing Goal: Respiratory complications will improve Outcome: Progressing Goal: Cardiovascular complication will be avoided Outcome: Progressing   Problem: Activity: Goal: Risk for activity intolerance will decrease Outcome: Progressing   Problem: Nutrition: Goal: Adequate nutrition will be maintained Outcome: Progressing   Problem: Coping: Goal: Level of anxiety will decrease Outcome: Progressing   Problem: Elimination: Goal: Will not experience complications related to bowel motility Outcome: Progressing Goal: Will not experience complications related to urinary retention Outcome: Progressing   Problem: Pain Management: Goal: General experience of comfort will improve Outcome: Progressing   Problem: Safety: Goal: Ability to remain free from injury will improve Outcome: Progressing   Problem: Skin Integrity: Goal: Risk for impaired skin integrity will decrease Outcome: Progressing   Problem: Education: Goal:  Ability to describe self-care measures that may prevent or decrease complications (Diabetes Survival Skills Education) will improve Outcome: Progressing Goal: Individualized Educational Video(s) Outcome: Progressing   Problem: Coping: Goal: Ability to adjust to condition or change in health will improve Outcome: Progressing   Problem: Fluid Volume: Goal: Ability to maintain a balanced intake and output will improve Outcome: Progressing   Problem: Health Behavior/Discharge Planning: Goal: Ability to identify and utilize available resources and services will improve Outcome: Progressing Goal: Ability to manage health-related needs will improve Outcome: Progressing   Problem: Metabolic: Goal: Ability to maintain appropriate glucose levels will improve Outcome: Progressing   Problem: Nutritional: Goal: Maintenance of adequate nutrition will improve Outcome: Progressing Goal: Progress toward achieving an optimal weight will improve Outcome: Progressing   Problem: Skin Integrity: Goal: Risk for impaired skin integrity will decrease Outcome: Progressing   Problem: Tissue Perfusion: Goal: Adequacy of tissue perfusion will improve Outcome: Progressing

## 2023-02-15 NOTE — Inpatient Diabetes Management (Signed)
Inpatient Diabetes Program Recommendations  AACE/ADA: New Consensus Statement on Inpatient Glycemic Control (2015)  Target Ranges:  Prepandial:   less than 140 mg/dL      Peak postprandial:   less than 180 mg/dL (1-2 hours)      Critically ill patients:  140 - 180 mg/dL   Lab Results  Component Value Date   GLUCAP 203 (H) 02/15/2023   HGBA1C 5.2 12/22/2022    Latest Reference Range & Units 02/14/23 21:39 02/15/23 08:09  Glucose-Capillary 70 - 99 mg/dL 130 (H) 865 (H)  (H): Data is abnormally high  Review of Glycemic Control  Diabetes history: DM2 Outpatient Diabetes medications: Toujeo 10 units hs Current orders for Inpatient glycemic control: Semglee 10 units, Novolog 0-15 units tid, 0-5 units hs  Inpatient Diabetes Program Recommendations:   Please consider: -Decrease Novolog correction to 0-9 tid, 0-5 units hs  Thank you, Darel Hong E. Shantia Sanford, RN, MSN, CDCES  Diabetes Coordinator Inpatient Glycemic Control Team Team Pager [phone] (8am-5pm) 02/15/2023 10:48 AM

## 2023-02-15 NOTE — Progress Notes (Signed)
1      PROGRESS NOTE    Adriana Miller  BMW:[id number] DOB: 1955/07/31 DOA: 02/14/2023 PCP: Carlean Jews, PA-C   Brief Narrative:   67 y.o. female with medical history significant for Persistent PAF/flutter on apixaban s/p failed DCCV 12/27/2022, DM2, CKD stage III-IV, cirrhosis  and hepatocellular carcinoma, seizure disorder, cerebral aneurysm status post repair, HTN, and HLD , as well as daily  frailty and frequent falls.  She is found to have left proximal humerus fracture  10/29: Orthopedic seen.  Recommends conservative management   Assessment & Plan:   Principal Problem:   Comminuted left humeral fracture, closed, initial encounter Active Problems:   Accidental fall   Alcoholic cirrhosis of liver without ascites (HCC)   Elevated ETOH level   Alcohol use disorder   History of recurrent UTI (urinary tract infection)   Essential hypertension   Persistent atrial fibrillation (HCC)   Uncontrolled type 2 diabetes mellitus with hyperglycemia, with long-term current use of insulin (HCC)   Chronic anticoagulation   Chronic heart failure with preserved ejection fraction (HFpEF) (HCC)   Type II diabetes mellitus with renal manifestations (HCC)   History of seizure   History of cerebral aneurysm repair   Stage 3b chronic kidney disease (HCC)  * Comminuted left humeral fracture, closed, initial encounter Accidental fall versus fall related to alcohol intoxication Left arm in sling Pain control as ordered Ortho seen.  Recommends conservative management PT and OT eval.  TOC eval   Elevated ETOH level Alcohol use disorder with possible acute intoxication Alcoholic cirrhosis without ascites EtOH level over 200 on arrival Alcohol cessation counseling Continue CIWA withdrawal protocol   History of recurrent UTI (urinary tract infection) Sepsis secondary to E. coli in August 2024 UA concerning for UTI.  Start IV Rocephin and await urine culture   Persistent atrial  fibrillation (HCC) Chronic anticoagulation History of unsuccessful DCCV in September 2024 Continue metoprolol and apixaban Last seen by cardiology 10/7   Essential hypertension HFpEF Clinically compensated Continue GDMT, spironolactone, metoprolol.  Continue Lasix as well   Uncontrolled type 2 diabetes mellitus with hyperglycemia, with long-term current use of insulin (HCC) Continue basal insulin with sliding scale coverage Diabetic nurse following.  Decrease NovoLog coverage per the recommendation   Stage 3b chronic kidney disease (HCC) Renal function at baseline   History of cerebral aneurysm repair No acute issues suspected.  Head CT was nonacute   DVT prophylaxis: Eliquis  apixaban (ELIQUIS) tablet 5 mg     Code Status: DNR Family Communication: None at bedside Disposition Plan: Possible discharge in next 1 to 2 days depending on clinical condition and PT and OT evaluation.   Consultants:  Orthopedics  Antimicrobials: IV Rocephin   Subjective: She reports pain at the left hand.  7 out of 10.  Objective: Vitals:   02/14/23 2348 02/15/23 0000 02/15/23 0811 02/15/23 1455  BP: (!) 127/94 (!) 127/94 120/65 118/64  Pulse: (!) 109 (!) 109 89 76  Resp: 16  16 17   Temp: (!) 97.5 F (36.4 C) 97.6 F (36.4 C) 98.7 F (37.1 C) (!) 97.5 F (36.4 C)  TempSrc:      SpO2: 100%  100% 98%  Weight:      Height:        Intake/Output Summary (Last 24 hours) at 02/15/2023 1644 Last data filed at 02/15/2023 1421 Gross per 24 hour  Intake 240 ml  Output 100 ml  Net 140 ml   Filed Weights   02/14/23  1525  Weight: 81.6 kg    Examination:  General exam: Appears calm and comfortable  Respiratory system: Clear to auscultation. Respiratory effort normal. Cardiovascular system: S1 & S2 heard, RRR. No JVD, murmurs, rubs, gallops or clicks. No pedal edema. Gastrointestinal system: Abdomen is nondistended, soft and nontender. No organomegaly or masses felt. Normal bowel  sounds heard. Central nervous system: Alert and oriented. No focal neurological deficits. Extremities: Left arm in sling Skin: No rashes, lesions or ulcers Psychiatry: Judgement and insight appear normal. Mood & affect appropriate.     Data Reviewed: I have personally reviewed following labs and imaging studies  CBC: Recent Labs  Lab 02/14/23 1530 02/15/23 0424  WBC 5.1 7.5  NEUTROABS 2.5  --   HGB 11.0* 10.2*  HCT 34.6* 31.3*  MCV 96.1 94.6  PLT 142* 146*   Basic Metabolic Panel: Recent Labs  Lab 02/14/23 1530 02/15/23 0424  NA 133* 135  K 4.5 4.6  CL 99 101  CO2 22 24  GLUCOSE 201* 195*  BUN 29* 33*  CREATININE 1.53* 1.56*  CALCIUM 8.0* 8.2*   GFR: Estimated Creatinine Clearance: 36.6 mL/min (A) (by C-G formula based on SCr of 1.56 mg/dL (H)). Liver Function Tests: Recent Labs  Lab 02/14/23 1530  AST 31  ALT 16  ALKPHOS 52  BILITOT 0.7  PROT 7.8  ALBUMIN 2.7*   Recent Labs  Lab 02/14/23 1530  LIPASE 68*   No results for input(s): "AMMONIA" in the last 168 hours. Coagulation Profile: No results for input(s): "INR", "PROTIME" in the last 168 hours. Cardiac Enzymes: Recent Labs  Lab 02/14/23 1530 02/14/23 1848  CKTOTAL 61 65   BNP (last 3 results) No results for input(s): "PROBNP" in the last 8760 hours. HbA1C: No results for input(s): "HGBA1C" in the last 72 hours. CBG: Recent Labs  Lab 02/14/23 2139 02/15/23 0809 02/15/23 1149  GLUCAP 236* 203* 212*     No results found for this or any previous visit (from the past 240 hour(s)).       Radiology Studies: DG Shoulder Left  Result Date: 02/14/2023 CLINICAL DATA:  fall EXAM: LEFT SHOULDER - 2+ VIEW COMPARISON:  None Available. FINDINGS: Acute comminuted and displaced left humeral head and neck fracture. No dislocation. Mild to moderate degenerative changes of the shoulder. Otherwise no aggressive appearing focal bone abnormality. Soft tissues are unremarkable. IMPRESSION: Acute  comminuted and displaced left humeral head and neck fracture. Electronically Signed   By: Tish Frederickson M.D.   On: 02/14/2023 20:01   DG Chest Portable 1 View  Result Date: 02/14/2023 CLINICAL DATA:  Larey Seat, left shoulder pain EXAM: PORTABLE CHEST 1 VIEW COMPARISON:  12/08/2022 FINDINGS: Single frontal view of the chest demonstrates an unremarkable cardiac silhouette. Lung volumes are diminished, without airspace disease, effusion, or pneumothorax. Comminuted intra-articular left humeral head and neck fracture. No other acute bony abnormalities. IMPRESSION: 1. Comminuted left humeral head and neck fracture. 2. Low lung volumes.  No acute airspace disease. Electronically Signed   By: Sharlet Salina M.D.   On: 02/14/2023 20:01   CT HEAD WO CONTRAST ( )  Result Date: 02/14/2023 CLINICAL DATA:  Head trauma, minor (Age >= 65y); Neck trauma (Age >= 65y). Fall. EXAM: CT HEAD WITHOUT CONTRAST CT CERVICAL SPINE WITHOUT CONTRAST TECHNIQUE: Multidetector CT imaging of the head and cervical spine was performed following the standard protocol without intravenous contrast. Multiplanar CT image reconstructions of the cervical spine were also generated. RADIATION DOSE REDUCTION: This exam was performed according to the  departmental dose-optimization program which includes automated exposure control, adjustment of the mA and/or kV according to patient size and/or use of iterative reconstruction technique. COMPARISON:  Head CT 11/17/2022.  CT cervical spine 09/22/2022. FINDINGS: CT HEAD FINDINGS Brain: No acute hemorrhage. 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 Cortical gray-white differentiation is otherwise preserved. Prominence of the ventricles and sulci within expected range for age. No hydrocephalus or extra-axial collection. No mass effect or midline shift. Vascular: No hyperdense vessel or unexpected  calcification. Skull: Prior right frontoparietal craniotomy. No calvarial fracture or suspicious bone lesion. Skull base is unremarkable. Sinuses/Orbits: No acute finding. Other: None. CT CERVICAL SPINE FINDINGS Alignment: Normal. Skull base and vertebrae: No acute fracture. Normal craniocervical junction. No suspicious bone lesions. Soft tissues and spinal canal: No prevertebral fluid or swelling. No visible canal hematoma. Disc levels: Unchanged multilevel cervical spondylosis, worst at C3-4, where there is at least moderate spinal canal stenosis. Upper chest: No acute findings. Other: Atherosclerotic calcifications of the carotid bulbs. IMPRESSION: 1. No acute intracranial abnormality. 2. No acute cervical spine fracture or traumatic listhesis. 3. Unchanged multilevel cervical spondylosis, worst at C3-4, where there is at least moderate spinal canal stenosis. Electronically Signed   By: Orvan Falconer M.D.   On: 02/14/2023 18:22   CT Cervical Spine Wo Contrast  Result Date: 02/14/2023 CLINICAL DATA:  Head trauma, minor (Age >= 65y); Neck trauma (Age >= 65y). Fall. EXAM: CT HEAD WITHOUT CONTRAST CT CERVICAL SPINE WITHOUT CONTRAST TECHNIQUE: Multidetector CT imaging of the head and cervical spine was performed following the standard protocol without intravenous contrast. Multiplanar CT image reconstructions of the cervical spine were also generated. RADIATION DOSE REDUCTION: This exam was performed according to the departmental dose-optimization program which includes automated exposure control, adjustment of the mA and/or kV according to patient size and/or use of iterative reconstruction technique. COMPARISON:  Head CT 11/17/2022.  CT cervical spine 09/22/2022. FINDINGS: CT HEAD FINDINGS Brain: No acute hemorrhage. 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 Cortical gray-white differentiation is  otherwise preserved. Prominence of the ventricles and sulci within expected range for age. No hydrocephalus or extra-axial collection. No mass effect or midline shift. Vascular: No hyperdense vessel or unexpected calcification. Skull: Prior right frontoparietal craniotomy. No calvarial fracture or suspicious bone lesion. Skull base is unremarkable. Sinuses/Orbits: No acute finding. Other: None. CT CERVICAL SPINE FINDINGS Alignment: Normal. Skull base and vertebrae: No acute fracture. Normal craniocervical junction. No suspicious bone lesions. Soft tissues and spinal canal: No prevertebral fluid or swelling. No visible canal hematoma. Disc levels: Unchanged multilevel cervical spondylosis, worst at C3-4, where there is at least moderate spinal canal stenosis. Upper chest: No acute findings. Other: Atherosclerotic calcifications of the carotid bulbs. IMPRESSION: 1. No acute intracranial abnormality. 2. No acute cervical spine fracture or traumatic listhesis. 3. Unchanged multilevel cervical spondylosis, worst at C3-4, where there is at least moderate spinal canal stenosis. Electronically Signed   By: Orvan Falconer M.D.   On: 02/14/2023 18:22        Scheduled Meds:  allopurinol  100 mg Oral Daily   apixaban  5 mg Oral BID   escitalopram  10 mg Oral QHS   folic acid  1 mg Oral Daily   furosemide  20 mg Oral Q48H   insulin aspart  0-5 Units Subcutaneous QHS  insulin aspart  0-9 Units Subcutaneous TID WC   insulin glargine-yfgn  10 Units Subcutaneous Q supper   metoprolol tartrate  25 mg Oral BID   mirtazapine  7.5 mg Oral QHS   multivitamin with minerals  1 tablet Oral Daily   spironolactone  25 mg Oral Daily   thiamine  100 mg Oral Daily   Or   thiamine  100 mg Intravenous Daily   Continuous Infusions:   LOS: 1 day    Time spent: 35 minutes    Delfino Lovett, MD [facility] Pager [phone]  If 7PM-7AM, please contact night-coverage [email or web address] Password [email or web address] 02/15/2023,  4:44 PM

## 2023-02-15 NOTE — Consult Note (Signed)
ORTHOPAEDIC CONSULTATION  REQUESTING PHYSICIAN: Delfino Lovett, MD  Chief Complaint:   Left proximal humerus fracture  History of Present Illness: Adriana Miller is a 67 y.o. female  with medical history significant for Persistent PAF/flutter on apixaban s/p failed DCCV 12/27/2022, DM2, CKD stage III-IV, cirrhosis  and hepatocellular carcinoma, seizure disorder, cerebral aneurysm status post repair, HTN, and HLD , as well as daily  frailty and frequent falls, prior UTIs with sepsis, most recently 11/2022 who presented to the emergency room yesterday after a fall injuring her left shoulder.  She denies head strike or loss of consciousness but was stuck on the floor due to inability to get up.  Upon evaluation in the emergency room she was found to have an elevated EtOH level and was recommended for admission due to safety on discharge concerns and pain control.  Patient denies any chest pain or shortness of breath at this time.  She denies any numbness or tingling in her left upper extremity.  She denies any pre-existing pain in her left arm prior to the fall.  She is right-hand dominant.  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)    B12 deficiency 04/05/2017   Blood in stool    Cerebral aneurysm    Chronic kidney disease    Diabetes mellitus without complication (HCC)    type 2   Gastric erosion with bleeding    Hepatocellular carcinoma (HCC)    Hyperlipidemia    Hypertension    Lower extremity cellulitis 02/08/2019   Paroxysmal A-fib (HCC)    Seizure disorder (HCC)    Past Surgical History:  Procedure Laterality Date   CARDIOVERSION N/A 12/27/2022   Procedure: CARDIOVERSION;  Surgeon: Debbe Odea, MD;  Location: [facility] ORS;  Service: Cardiovascular;  Laterality: N/A;   CEREBRAL ANEURYSM REPAIR  1991   COLONOSCOPY WITH PROPOFOL N/A 02/12/2019   Procedure: COLONOSCOPY WITH  PROPOFOL;  Surgeon: Toney Reil, MD;  Location: [facility] ENDOSCOPY;  Service: Gastroenterology;  Laterality: N/A;   ESOPHAGOGASTRODUODENOSCOPY (EGD) WITH PROPOFOL N/A 02/12/2019   Procedure: ESOPHAGOGASTRODUODENOSCOPY (EGD) WITH PROPOFOL;  Surgeon: Toney Reil, MD;  Location: [facility] ENDOSCOPY;  Service: Gastroenterology;  Laterality: N/A;   IR ANGIOGRAM SELECTIVE EACH ADDITIONAL VESSEL  09/21/2019   IR ANGIOGRAM SELECTIVE EACH ADDITIONAL VESSEL  09/21/2019   IR ANGIOGRAM SELECTIVE EACH ADDITIONAL VESSEL  09/21/2019   IR ANGIOGRAM SELECTIVE EACH ADDITIONAL VESSEL  09/21/2019   IR ANGIOGRAM SELECTIVE EACH ADDITIONAL VESSEL  10/02/2019   IR ANGIOGRAM VISCERAL SELECTIVE  09/21/2019   IR ANGIOGRAM VISCERAL SELECTIVE  09/21/2019   IR ANGIOGRAM VISCERAL SELECTIVE  10/02/2019   IR EMBO TUMOR ORGAN ISCHEMIA INFARCT INC GUIDE ROADMAPPING  10/02/2019   IR EMBO TUMOR ORGAN ISCHEMIA INFARCT INC GUIDE ROADMAPPING  10/02/2019   IR RADIOLOGIST EVAL & MGMT  08/28/2019   IR RADIOLOGIST EVAL & MGMT  01/30/2020   IR RADIOLOGIST EVAL & MGMT  04/30/2020   IR RADIOLOGIST EVAL & MGMT  07/30/2020   IR RADIOLOGIST EVAL & MGMT  10/28/2020   IR RADIOLOGIST EVAL & MGMT  02/24/2021   IR RADIOLOGIST EVAL & MGMT  06/08/2021   IR RADIOLOGIST EVAL & MGMT  11/24/2021   IR RADIOLOGIST EVAL & MGMT  05/25/2022   IR US GUIDE VASC ACCESS LEFT  09/21/2019   IR US GUIDE VASC ACCESS LEFT  10/02/2019   OPEN REDUCTION INTERNAL FIXATION (ORIF) DISTAL RADIAL FRACTURE Right 08/06/2021   Procedure: OPEN REDUCTION INTERNAL FIXATION (  ORIF) DISTAL RADIAL FRACTURE;  Surgeon: Kennedy Bucker, MD;  Location: [facility] ORS;  Service: Orthopedics;  Laterality: Right;   Social History   Socioeconomic History   Marital status: Divorced    Spouse name: Not on file   Number of children: Not on file   Years of education: Not on file   Highest education level: Not on file  Occupational History   Not on file  Tobacco Use   Smoking status: Never   Smokeless tobacco:  Current    Types: Snuff  Vaping Use   Vaping status: Never Used  Substance and Sexual Activity   Alcohol use: Yes    Alcohol/week: 5.0 standard drinks of alcohol    Types: 5 Cans of beer per week   Drug use: No   Sexual activity: Not Currently  Other Topics Concern   Not on file  Social History Narrative   Lives alone   Social Determinants of Health   Financial Resource Strain: Not on file  Food Insecurity: No Food Insecurity (02/14/2023)   Hunger Vital Sign    Worried About Running Out of Food in the Last Year: Never true    Ran Out of Food in the Last Year: Never true  Transportation Needs: No Transportation Needs (02/14/2023)   PRAPARE - Administrator, Civil Service (Medical): No    Lack of Transportation (Non-Medical): No  Physical Activity: Not on file  Stress: Not on file  Social Connections: Not on file   Family History  Problem Relation Age of Onset   Heart attack Brother 37   Allergies  Allergen Reactions   Aspirin Other (See Comments)    Nose bleed   Prior to Admission medications   Medication Sig Start Date End Date Taking? Authorizing Provider  acetaminophen (TYLENOL) 325 MG tablet Take 2 tablets (650 mg total) by mouth every 6 (six) hours as needed for mild pain (or Fever >/= 101). 06/19/22  Yes Loyce Dys, MD  Alcohol Swabs (B-D SINGLE USE SWABS REGULAR) PADS USE AS DIRECTED TWICE DAILY 07/26/22  Yes McDonough, Lauren K, PA-C  allopurinol (ZYLOPRIM) 100 MG tablet Take 1 tablet (100 mg total) by mouth daily. 12/31/22  Yes McDonough, Lauren K, PA-C  apixaban (ELIQUIS) 5 MG TABS tablet Take 1 tablet (5 mg total) by mouth 2 (two) times daily. 12/31/22  Yes End, Cristal Deer, MD  Blood Glucose Monitoring Suppl (ACCU-CHEK GUIDE ME) w/Device KIT Use as directed  dx E11.65 07/30/22  Yes McDonough, Lauren K, PA-C  Calcium Carb-Cholecalciferol (CALCIUM 500/D) 500-5 MG-MCG TABS Take 1 tab po daily 12/31/22  Yes McDonough, Salomon Fick, PA-C  Continuous Blood Gluc  Receiver (DEXCOM G7 RECEIVER) DEVI Use for continuous blood glucose monitoring  DX E11.65 11/09/21  Yes McDonough, Lauren K, PA-C  Continuous Glucose Sensor (DEXCOM G7 SENSOR) MISC USE AS DIRECTED FOR CONTINUOUS  GLUCOSE MONITORING. CHANGE  SENSOR EVERY 10 DAYS 10/06/22  Yes McDonough, Lauren K, PA-C  escitalopram (LEXAPRO) 10 MG tablet Take 1 tablet daily for anxiety/sleep. 12/31/22  Yes McDonough, Salomon Fick, PA-C  folic acid (FOLVITE) 1 MG tablet Take 1 tablet (1 mg total) by mouth daily. 12/31/22  Yes McDonough, Lauren K, PA-C  furosemide (LASIX) 20 MG tablet TAKE 1 TABLET BY MOUTH EVERY DAY 01/04/23  Yes McDonough, Lauren K, PA-C  glucose blood (ACCU-CHEK GUIDE) test strip Use as instructed twice a day DX E11.65 08/03/22  Yes McDonough, Lauren K, PA-C  ibuprofen (ADVIL) 200 MG tablet Take 200 mg by  mouth as needed for headache or moderate pain.   Yes [provider]  insulin glargine, 1 Unit Dial, (TOUJEO SOLOSTAR) 300 UNIT/ML Solostar Pen Inject 10 Units into the skin daily with supper. 12/31/22  Yes McDonough, Lauren K, PA-C  Insulin Pen Needle (BD PEN NEEDLE NANO 2ND GEN) 32G X 4 MM MISC USE AS DIRECTED WITH TOUJEO 12/31/22  Yes McDonough, Lauren K, PA-C  metoprolol tartrate (LOPRESSOR) 25 MG tablet Take 1 tablet (25 mg total) by mouth 2 (two) times daily. 01/24/23  Yes Dunn, Ryan M, PA-C  mirtazapine (REMERON) 7.5 MG tablet TAKE 1 TABLET BY MOUTH DAILY AT  BEDTIME 12/31/22  Yes McDonough, Lauren K, PA-C  potassium chloride (KLOR-CON) 8 MEQ tablet TAKE 1 TABLET BY MOUTH EVERY DAY 01/28/23  Yes End, Cristal Deer, MD  Safety Lancets 28G MISC Use as directed once a daily Dx e11.65 07/30/22  Yes McDonough, Lauren K, PA-C  spironolactone (ALDACTONE) 25 MG tablet Take 1 tablet (25 mg total) by mouth daily. 12/31/22  Yes McDonough, Lauren K, PA-C  thiamine (VITAMIN B-1) 100 MG tablet Take 1 tablet (100 mg total) by mouth daily. 01/04/23  Yes McDonough, Lauren K, PA-C  traZODone (DESYREL) 50 MG tablet TAKE 1  TO 2 TABLETS BY MOUTH AT  BEDTIME FOR INSOMNIA 12/31/22  Yes McDonough, Salomon Fick, PA-C   DG Shoulder Left  Result Date: 02/14/2023 CLINICAL DATA:  fall EXAM: LEFT SHOULDER - 2+ VIEW COMPARISON:  None Available. FINDINGS: Acute comminuted and displaced left humeral head and neck fracture. No dislocation. Mild to moderate degenerative changes of the shoulder. Otherwise no aggressive appearing focal bone abnormality. Soft tissues are unremarkable. IMPRESSION: Acute comminuted and displaced left humeral head and neck fracture. Electronically Signed   By: Tish Frederickson M.D.   On: 02/14/2023 20:01   DG Chest Portable 1 View  Result Date: 02/14/2023 CLINICAL DATA:  Larey Seat, left shoulder pain EXAM: PORTABLE CHEST 1 VIEW COMPARISON:  12/08/2022 FINDINGS: Single frontal view of the chest demonstrates an unremarkable cardiac silhouette. Lung volumes are diminished, without airspace disease, effusion, or pneumothorax. Comminuted intra-articular left humeral head and neck fracture. No other acute bony abnormalities. IMPRESSION: 1. Comminuted left humeral head and neck fracture. 2. Low lung volumes.  No acute airspace disease. Electronically Signed   By: Sharlet Salina M.D.   On: 02/14/2023 20:01   CT HEAD WO CONTRAST ( )  Result Date: 02/14/2023 CLINICAL DATA:  Head trauma, minor (Age >= 65y); Neck trauma (Age >= 65y). Fall. EXAM: CT HEAD WITHOUT CONTRAST CT CERVICAL SPINE WITHOUT CONTRAST TECHNIQUE: Multidetector CT imaging of the head and cervical spine was performed following the standard protocol without intravenous contrast. Multiplanar CT image reconstructions of the cervical spine were also generated. RADIATION DOSE REDUCTION: This exam was performed according to the departmental dose-optimization program which includes automated exposure control, adjustment of the mA and/or kV according to patient size and/or use of iterative reconstruction technique. COMPARISON:  Head CT 11/17/2022.  CT cervical spine  09/22/2022. FINDINGS: CT HEAD FINDINGS Brain: No acute hemorrhage. 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 Cortical gray-white differentiation is otherwise preserved. Prominence of the ventricles and sulci within expected range for age. No hydrocephalus or extra-axial collection. No mass effect or midline shift. Vascular: No hyperdense vessel or unexpected calcification. Skull: Prior right frontoparietal craniotomy. No calvarial fracture or suspicious bone lesion. Skull base is unremarkable. Sinuses/Orbits: No acute finding. Other: None. CT  CERVICAL SPINE FINDINGS Alignment: Normal. Skull base and vertebrae: No acute fracture. Normal craniocervical junction. No suspicious bone lesions. Soft tissues and spinal canal: No prevertebral fluid or swelling. No visible canal hematoma. Disc levels: Unchanged multilevel cervical spondylosis, worst at C3-4, where there is at least moderate spinal canal stenosis. Upper chest: No acute findings. Other: Atherosclerotic calcifications of the carotid bulbs. IMPRESSION: 1. No acute intracranial abnormality. 2. No acute cervical spine fracture or traumatic listhesis. 3. Unchanged multilevel cervical spondylosis, worst at C3-4, where there is at least moderate spinal canal stenosis. Electronically Signed   By: Orvan Falconer M.D.   On: 02/14/2023 18:22   CT Cervical Spine Wo Contrast  Result Date: 02/14/2023 CLINICAL DATA:  Head trauma, minor (Age >= 65y); Neck trauma (Age >= 65y). Fall. EXAM: CT HEAD WITHOUT CONTRAST CT CERVICAL SPINE WITHOUT CONTRAST TECHNIQUE: Multidetector CT imaging of the head and cervical spine was performed following the standard protocol without intravenous contrast. Multiplanar CT image reconstructions of the cervical spine were also generated. RADIATION DOSE REDUCTION: This exam was performed according to the departmental dose-optimization  program which includes automated exposure control, adjustment of the mA and/or kV according to patient size and/or use of iterative reconstruction technique. COMPARISON:  Head CT 11/17/2022.  CT cervical spine 09/22/2022. FINDINGS: CT HEAD FINDINGS Brain: No acute hemorrhage. 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 Cortical gray-white differentiation is otherwise preserved. Prominence of the ventricles and sulci within expected range for age. No hydrocephalus or extra-axial collection. No mass effect or midline shift. Vascular: No hyperdense vessel or unexpected calcification. Skull: Prior right frontoparietal craniotomy. No calvarial fracture or suspicious bone lesion. Skull base is unremarkable. Sinuses/Orbits: No acute finding. Other: None. CT CERVICAL SPINE FINDINGS Alignment: Normal. Skull base and vertebrae: No acute fracture. Normal craniocervical junction. No suspicious bone lesions. Soft tissues and spinal canal: No prevertebral fluid or swelling. No visible canal hematoma. Disc levels: Unchanged multilevel cervical spondylosis, worst at C3-4, where there is at least moderate spinal canal stenosis. Upper chest: No acute findings. Other: Atherosclerotic calcifications of the carotid bulbs. IMPRESSION: 1. No acute intracranial abnormality. 2. No acute cervical spine fracture or traumatic listhesis. 3. Unchanged multilevel cervical spondylosis, worst at C3-4, where there is at least moderate spinal canal stenosis. Electronically Signed   By: Orvan Falconer M.D.   On: 02/14/2023 18:22    Positive ROS: All other systems have been reviewed and were otherwise negative with the exception of those mentioned in the HPI and as above.  Physical Exam: General:  Alert, no acute distress Psychiatric:  Patient is competent for consent with normal mood and affect   Cardiovascular:  No pedal edema Respiratory:   No wheezing, non-labored breathing GI:  Abdomen is soft and non-tender Skin:  No lesions in the area of chief complaint Neurologic:  Sensation intact distally Lymphatic:  No axillary or cervical lymphadenopathy  Orthopedic Exam:  Left upper extremity Skin intact over the shoulder with some swelling and tenderness to palpation compartments soft No pain over the elbow forearm, distal humerus, wrist hand or fingers Able to flex and extend her wrist and hand without pain neurovascular intact +ain/PIN/U/M/R/Ax  Good radial pulse with brisk capillary refill  Secondary survey No tenderness to palpation over other bony prominences in the lower extremities or right upper extremity No pain with logroll or simulated axial loading of the bilateral lower remedies All compartments soft No tenderness  to palpation over the cervical or thoracic spine, no bony step-off Motor grossly intact throughout, no focal deficits Sensation grossly intact throughout, no focal deficits Good distal pulses and capillary refill on all extremities   X-rays:  X-rays reviewed of the left shoulder images and report reviewed myself which show a comminuted minimally displaced left proximal humerus fracture no dislocation noted.  Agree with radiology interpretation.  Assessment: Left proximal humerus fracture  Plan: I reviewed the radiographic and clinical findings with the patient and we discussed the natural course and history of fracture healing and treatment options for proximal numerous fractures.  The fracture remains in reasonable alignment on her nondominant upper extremity at this time.  Given her other comorbid medical conditions and ongoing issues we discussed surgical versus nonsurgical treatment.  At this time under shared decision making model we will initiate a course of nonoperative management and see how the fracture does over the next week or so with close follow-up in the office and repeat x-rays.  We did  discuss the risk of nonunion, malunion, delayed union, neurovascular injury, and potential indications for surgical intervention.  Patient agrees to the plan for an initial course of nonoperative treatment with follow-up x-rays in the office and I gave her contact information in order to make the appointment for follow-up.  All questions answered and she agrees with the plan.  Patient may follow-up in [place] [facility] orthopedics within the next 7 to 10 days call the office for an appointment.   Reinaldo Berber MD  Beeper #:  [phone]  02/15/2023 12:41 PM

## 2023-02-15 NOTE — Progress Notes (Signed)
[facility] Liaison note:   This patient is currently enrolled in [facility] outpatient-based palliative care.  [facility] will continue to follow for discharge disposition.    Please call for any outpatient based palliative care related questions or concerns.   [facility] Liaison [phone]

## 2023-02-16 ENCOUNTER — Ambulatory Visit: Admission: RE | Admit: 2023-02-16 | Payer: 59 | Source: Ambulatory Visit

## 2023-02-16 DIAGNOSIS — Z9889 Other specified postprocedural states: Secondary | ICD-10-CM

## 2023-02-16 DIAGNOSIS — Z87898 Personal history of other specified conditions: Secondary | ICD-10-CM

## 2023-02-16 DIAGNOSIS — Z7901 Long term (current) use of anticoagulants: Secondary | ICD-10-CM

## 2023-02-16 DIAGNOSIS — N179 Acute kidney failure, unspecified: Secondary | ICD-10-CM

## 2023-02-16 DIAGNOSIS — K7030 Alcoholic cirrhosis of liver without ascites: Secondary | ICD-10-CM | POA: Diagnosis not present

## 2023-02-16 DIAGNOSIS — N1832 Chronic kidney disease, stage 3b: Secondary | ICD-10-CM

## 2023-02-16 DIAGNOSIS — I10 Essential (primary) hypertension: Secondary | ICD-10-CM

## 2023-02-16 DIAGNOSIS — W19XXXA Unspecified fall, initial encounter: Secondary | ICD-10-CM | POA: Diagnosis not present

## 2023-02-16 DIAGNOSIS — Z794 Long term (current) use of insulin: Secondary | ICD-10-CM

## 2023-02-16 DIAGNOSIS — E1165 Type 2 diabetes mellitus with hyperglycemia: Secondary | ICD-10-CM

## 2023-02-16 DIAGNOSIS — Z8679 Personal history of other diseases of the circulatory system: Secondary | ICD-10-CM

## 2023-02-16 DIAGNOSIS — I5032 Chronic diastolic (congestive) heart failure: Secondary | ICD-10-CM

## 2023-02-16 DIAGNOSIS — S42352A Displaced comminuted fracture of shaft of humerus, left arm, initial encounter for closed fracture: Secondary | ICD-10-CM | POA: Diagnosis not present

## 2023-02-16 DIAGNOSIS — F1090 Alcohol use, unspecified, uncomplicated: Secondary | ICD-10-CM | POA: Diagnosis not present

## 2023-02-16 LAB — BASIC METABOLIC PANEL
Anion gap: 9 (ref 5–15)
BUN: 43 mg/dL — ABNORMAL HIGH (ref 8–23)
CO2: 27 mmol/L (ref 22–32)
Calcium: 8.6 mg/dL — ABNORMAL LOW (ref 8.9–10.3)
Chloride: 96 mmol/L — ABNORMAL LOW (ref 98–111)
Creatinine, Ser: 2.19 mg/dL — ABNORMAL HIGH (ref 0.44–1.00)
GFR, Estimated: 24 mL/min — ABNORMAL LOW (ref >60)
Glucose, Bld: 173 mg/dL — ABNORMAL HIGH (ref 70–99)
Potassium: 5 mmol/L (ref 3.5–5.1)
Sodium: 132 mmol/L — ABNORMAL LOW (ref 135–145)

## 2023-02-16 LAB — GLUCOSE, CAPILLARY
Glucose-Capillary: 174 mg/dL — ABNORMAL HIGH (ref 70–99)
Glucose-Capillary: 223 mg/dL — ABNORMAL HIGH (ref 70–99)
Glucose-Capillary: 246 mg/dL — ABNORMAL HIGH (ref 70–99)
Glucose-Capillary: 206 mg/dL — ABNORMAL HIGH (ref 70–99)
Glucose-Capillary: 226 mg/dL — ABNORMAL HIGH (ref 70–99)

## 2023-02-16 LAB — CBC
HCT: 27.7 % — ABNORMAL LOW (ref 36.0–46.0)
Hemoglobin: 9 g/dL — ABNORMAL LOW (ref 12.0–15.0)
MCH: 31.4 pg (ref 26.0–34.0)
MCHC: 32.5 g/dL (ref 30.0–36.0)
MCV: 96.5 fL (ref 80.0–100.0)
Platelets: 133 10*3/uL — ABNORMAL LOW (ref 150–400)
RBC: 2.87 MIL/uL — ABNORMAL LOW (ref 3.87–5.11)
RDW: 15.9 % — ABNORMAL HIGH (ref 11.5–15.5)
WBC: 7.7 10*3/uL (ref 4.0–10.5)
nRBC: 0 % (ref 0.0–0.2)

## 2023-02-16 MED ORDER — SODIUM CHLORIDE 0.9 % IV SOLN: INTRAVENOUS | Status: AC

## 2023-02-16 NOTE — Evaluation (Signed)
Occupational Therapy Evaluation Patient Details Name: Adriana Miller MRN: [id number] DOB: Feb 15, 1956 Today's Date: 02/16/2023   History of Present Illness Pt is a 67 yo female presenting  with a comminuted L humeral fracture after a fall injuring her L shoulder. PMH includes hx of falls, elevated ETOH levels, alcohol use disorder, recurrent UTIs, HTN, persistant a-fib, CHF with preserved EF, seizures, DM, cerebral aneurysm repair, and stage 3b CKD.   Clinical Impression   Patient presenting with decreased Ind in self care,balance, functional mobility/transfers, endurance, and safety awareness. Patient reports living at home alone and being mod I for mobility with use of SPC or rollator. She has an aide that comes 7x/wk for 1-2 hours to assist with self care and IADLs. Meals on wheels drops off 5 meals a week to pt's door. Per chart, ortho has had extensive conversation with pt to explain non surgical intervention for L UE fx. Pt appears confused and believes herself to have a sprain. OT educating pt on NWB precautions and sling use. Pt needing assistance to come to EOB for orthostatics to be taken and MD notified of results. Please see below for results with pt being symptomatic throughout and limited pt's ability to perform OOB activity.  Patient will benefit from acute OT to increase overall independence in the areas of ADLs, functional mobility, and safety awareness in order to safely discharge.   Supine:  105/62(76) EOB: 97/79(86) Standing: 82/55(65)      If plan is discharge home, recommend the following: A lot of help with walking and/or transfers;A lot of help with bathing/dressing/bathroom;Assistance with cooking/housework;Assist for transportation;Help with stairs or ramp for entrance    Functional Status Assessment  Patient has had a recent decline in their functional status and demonstrates the ability to make significant improvements in function in a reasonable and predictable  amount of time.  Equipment Recommendations  Other (comment) (defer to next venue of care)       Precautions / Restrictions Precautions Precautions: Fall Required Braces or Orthoses: Sling Restrictions Weight Bearing Restrictions: Yes LUE Weight Bearing: Non weight bearing      Mobility Bed Mobility Overal bed mobility: Needs Assistance Bed Mobility: Supine to Sit, Sit to Supine     Supine to sit: Mod assist, Used rails, HOB elevated Sit to supine: Mod assist, +2 for physical assistance        Transfers Overall transfer level: Needs assistance Equipment used: Hemi-walker Transfers: Sit to/from Stand Sit to Stand: Mod assist, +2 safety/equipment, +2 physical assistance                  Balance Overall balance assessment: Needs assistance Sitting-balance support: Feet supported, No upper extremity supported Sitting balance-Leahy Scale: Fair     Standing balance support: Reliant on assistive device for balance, Single extremity supported Standing balance-Leahy Scale: Poor                             ADL either performed or assessed with clinical judgement   ADL Overall ADL's : Needs assistance/impaired                                       General ADL Comments: significant limitations related to self care needs with L shoulder in sling an NWB. Pt having difficulty with problem solving related to NWB and mobility as well.  Vision Patient Visual Report: No change from baseline              Pertinent Vitals/Pain Pain Assessment Pain Assessment: Faces Faces Pain Scale: Hurts even more Pain Location: L shoulder Pain Descriptors / Indicators: Aching, Grimacing, Throbbing, Discomfort Pain Intervention(s): Limited activity within patient's tolerance, Monitored during session, Premedicated before session, Repositioned     Extremity/Trunk Assessment Upper Extremity Assessment Upper Extremity Assessment: Right hand dominant;LUE  deficits/detail LUE Deficits / Details: NWB and in sling secondary to conservative tx of L humerus fx LUE: Unable to fully assess due to pain   Lower Extremity Assessment Lower Extremity Assessment: Generalized weakness   Cervical / Trunk Assessment Cervical / Trunk Assessment: Normal   Communication Communication Communication: Difficulty following commands/understanding Following commands: Follows one step commands with increased time Cueing Techniques: Verbal cues;Gestural cues;Tactile cues;Visual cues   Cognition Arousal: Alert Behavior During Therapy: WFL for tasks assessed/performed Overall Cognitive Status: No family/caregiver present to determine baseline cognitive functioning Area of Impairment: Problem solving, Safety/judgement, Awareness, Memory                         Safety/Judgement: Decreased awareness of safety, Decreased awareness of deficits   Problem Solving: Slow processing       General Comments  large bruise on anterior L shoulder            Home Living Family/patient expects to be discharged to:: Private residence Living Arrangements: Alone Available Help at Discharge: Personal care attendant;Available PRN/intermittently Type of Home: Apartment Home Access: Level entry     Home Layout: One level     Bathroom Shower/Tub: Chief Strategy Officer: Handicapped height     Home Equipment: Shower seat;Grab bars - toilet;Cane - single point;Rollator (4 wheels);Grab bars - tub/shower          Prior Functioning/Environment Prior Level of Function : Needs assist;History of Falls (last six months)             Mobility Comments: Pt reports using SPC or rollator for mobility ADLs Comments: per pt report, home health aide assists with bathing, cleaning, dressing (especially putting on her socks), and food prep; she gets Meals on Wheels, and is not driving. Aide comes 7x/wk for 1-2 hours.        OT Problem List: Decreased  strength;Decreased activity tolerance;Decreased safety awareness;Impaired balance (sitting and/or standing);Decreased knowledge of use of DME or AE;Pain;Decreased cognition;Decreased range of motion;Impaired UE functional use      OT Treatment/Interventions: Self-care/ADL training;Therapeutic exercise;Therapeutic activities;Energy conservation;DME and/or AE instruction;Patient/family education;Balance training    OT Goals(Current goals can be found in the care plan section) Acute Rehab OT Goals Patient Stated Goal: to go home and decrease pain in L UE OT Goal Formulation: With patient Time For Goal Achievement: 03/02/23 Potential to Achieve Goals: Fair ADL Goals Pt Will Perform Grooming: with contact guard assist;standing Pt Will Perform Lower Body Dressing: with contact guard assist;sit to/from stand Pt Will Transfer to Toilet: with min assist;stand pivot transfer Pt Will Perform Toileting - Clothing Manipulation and hygiene: with contact guard assist;sit to/from stand  OT Frequency: Min 1X/week       AM-PAC OT "6 Clicks" Daily Activity     Outcome Measure Help from another person eating meals?: A Little Help from another person taking care of personal grooming?: A Little Help from another person toileting, which includes using toliet, bedpan, or urinal?: A Lot Help from another person bathing (including washing,  rinsing, drying)?: A Lot Help from another person to put on and taking off regular upper body clothing?: A Lot Help from another person to put on and taking off regular lower body clothing?: A Lot 6 Click Score: 14   End of Session Equipment Utilized During Treatment: Other (comment) (hemi walker) Nurse Communication: Mobility status  Activity Tolerance: Patient tolerated treatment well Patient left: in bed;with call bell/phone within reach;with bed alarm set  OT Visit Diagnosis: Unsteadiness on feet (R26.81);Repeated falls (R29.6);Muscle weakness (generalized) (M62.81)                 Time: 5284-1324 OT Time Calculation (min): 18 min Charges:  OT General Charges $OT Visit: 1 Visit OT Evaluation $OT Eval Moderate Complexity: 1 Mod OT Treatments $Therapeutic Activity: 8-22 mins  Jackquline Denmark, MS, OTR/L , CBIS ascom [phone]  02/16/23, 1:00 PM

## 2023-02-16 NOTE — Significant Event (Signed)
Rapid Response Event Note   Reason for Call :  Unresponsive episode  Initial Focused Assessment:  Rapid response RN arrived in patient's room with patient lying in bed with left arm in sling surrounded by 1A staff, PT staff, and patient's MD. Per report from PT staff, patient had been working with patient and went unresponsive. PT helped her back to bed and patient aroused. Per PT was unresponsive for less than one minute and had no rhythmic twitching. BP 106/63 MAP 74, HR initally 76 as progressed up to 120s, oxygen saturations 99-100% on room air. Patient reports no dizziness or shortness of breath. Patient does report pain in left arm/shoulder.  Interventions:  CBG 223  Plan of Care:  Patient to be placed on cardiac monitoring. Dr. Signe Colt reviewing patient's orders and making other changes as needed. No further needs from rapid response team at this time.  Event Summary:   MD Notified: Dr. Signe Colt Call Time: 09:11 Arrival Time: 09:13 End Time: 09:21  [name], RN

## 2023-02-16 NOTE — Plan of Care (Signed)
  Problem: Clinical Measurements: Goal: Signs and symptoms of infection will decrease Outcome: Progressing   Problem: Education: Goal: Knowledge of General Education information will improve Description: Including pain rating scale, medication(s)/side effects and non-pharmacologic comfort measures Outcome: Progressing   Problem: Activity: Goal: Risk for activity intolerance will decrease Outcome: Progressing   Problem: Nutrition: Goal: Adequate nutrition will be maintained Outcome: Progressing   Problem: Elimination: Goal: Will not experience complications related to urinary retention Outcome: Progressing   Problem: Pain Management: Goal: General experience of comfort will improve Outcome: Progressing   Problem: Skin Integrity: Goal: Risk for impaired skin integrity will decrease Outcome: Progressing   Problem: Coping: Goal: Ability to adjust to condition or change in health will improve Outcome: Progressing   Problem: Skin Integrity: Goal: Risk for impaired skin integrity will decrease Outcome: Progressing

## 2023-02-16 NOTE — Progress Notes (Signed)
Progress Note   Patient: Adriana Miller YNW:[id number] DOB: 06/01/1955 DOA: 02/14/2023     2 DOS: the patient was seen and examined on 02/16/2023   Brief hospital course: 67 y.o. female with medical history significant for Persistent PAF/flutter on apixaban s/p failed DCCV 12/27/2022, DM2, CKD stage III-IV, cirrhosis  and hepatocellular carcinoma, seizure disorder, cerebral aneurysm status post repair, HTN, and HLD , as well as daily  frailty and frequent falls.  She is found to have left proximal humerus fracture   10/29: Orthopedic seen.  Recommends conservative management. 10/30: Rapid response called as she lost consciousness briefly while working with PT.   Assessment and Plan: * Comminuted left humeral fracture, closed, initial encounter Left arm in sling. Pain control with tylenol. Advised to limit opioids. Ortho seen.  Recommends conservative management PT and OT follow up.  TOC eval  Accidental fall versus fall related to alcohol intoxication Syncopal episode while working with PT. Orthostatic vitals positive per PT/ OT. Place her on cardiac telemetry. EKG stat. CBG 200 Will give gentle IV fluids. Encourage oral fluids.  Acute on CKD stage 3b: Today her kidney function worsened. Hold Lasix, aldactone. Gentle IV fluids.  Elevated ETOH level Alcohol use disorder with possible acute intoxication Alcoholic cirrhosis without ascites EtOH level over 220 on arrival Alcohol cessation counseled Continue CIWA withdrawal protocol. Advised to limit opiates.   History of recurrent UTI (urinary tract infection) Sepsis secondary to E. coli in August 2024 UA concerning for UTI.  She is started on IV Rocephin and pending urine culture   Persistent atrial fibrillation (HCC) Chronic anticoagulation History of unsuccessful DCCV in September 2024 Continue metoprolol and apixaban Last seen by cardiology 10/7   Essential hypertension HFpEF Clinically compensated Lasix,  spironolactone held, continue metoprolol.  Gentle IV fluids, watch for fluid overload.   Uncontrolled type 2 diabetes mellitus with hyperglycemia, with long-term current use of insulin (HCC) Continue basal insulin with sliding scale coverage Diabetic nurse following.  NovoLog coverage per the recommendation   History of cerebral aneurysm repair No acute issues suspected.  Head CT was nonacute. Continue neurochecks.     Out of bed to chair. Incentive spirometry. Nursing supportive care. Fall, aspiration precautions. DVT prophylaxis   Code Status: Do not attempt resuscitation (DNR) PRE-ARREST INTERVENTIONS DESIRED  Subjective: Patient is seen and examined today morning. Rapid response called as she lost consciousness briefly while working with PT. During my exam, she is able to answer me appropriately, states she had severe left shoulder pain while getting out of bed, thinks she is lowered to bed. Non focal exam. Asks for pain meds for shoulder pain. CBG 223, tachycardic. EKG, Telemetry ordered.  Physical Exam: Vitals:   02/15/23 1455 02/15/23 2325 02/16/23 0732 02/16/23 0917  BP: 118/64 135/76 (!) 121/102 106/63  Pulse: 76 99 (!) 103 (!) 123  Resp: 17 17 16 18   Temp: (!) 97.5 F (36.4 C) 97.9 F (36.6 C) 98.3 F (36.8 C) 98.4 F (36.9 C)  TempSrc:      SpO2: 98% 98% 100% 99%  Weight:      Height:        General - Elderly Caucasian female, no apparent distress HEENT - PERRLA, EOMI, atraumatic head, non tender sinuses. Lung - Clear, basal rales, no rhonchi, wheezes. Heart - S1, S2 heard, tachycardia, trace pedal edema. Abdomen - Soft, non tender, bowel sounds good Neuro - Alert, awake and oriented x 3, non focal exam. Skin - Warm and dry. Left shoulder sling.  Data Reviewed:      Latest Ref Rng & Units 02/16/2023    4:51 AM 02/15/2023    4:24 AM 02/14/2023    3:30 PM  CBC  WBC 4.0 - 10.5 K/uL 7.7  7.5  5.1   Hemoglobin 12.0 - 15.0 g/dL 9.0  16.1  09.6    Hematocrit 36.0 - 46.0 % 27.7  31.3  34.6   Platelets 150 - 400 K/uL 133  146  142       Latest Ref Rng & Units 02/16/2023    4:51 AM 02/15/2023    4:24 AM 02/14/2023    3:30 PM  BMP  Glucose 70 - 99 mg/dL 045  409  811   BUN 8 - 23 mg/dL 43  33  29   Creatinine 0.44 - 1.00 mg/dL 9.14  7.82  9.56   Sodium 135 - 145 mmol/L 132  135  133   Potassium 3.5 - 5.1 mmol/L 5.0  4.6  4.5   Chloride 98 - 111 mmol/L 96  101  99   CO2 22 - 32 mmol/L 27  24  22    Calcium 8.9 - 10.3 mg/dL 8.6  8.2  8.0    DG Shoulder Left  Result Date: 02/14/2023 CLINICAL DATA:  fall EXAM: LEFT SHOULDER - 2+ VIEW COMPARISON:  None Available. FINDINGS: Acute comminuted and displaced left humeral head and neck fracture. No dislocation. Mild to moderate degenerative changes of the shoulder. Otherwise no aggressive appearing focal bone abnormality. Soft tissues are unremarkable. IMPRESSION: Acute comminuted and displaced left humeral head and neck fracture. Electronically Signed   By: Tish Frederickson M.D.   On: 02/14/2023 20:01   DG Chest Portable 1 View  Result Date: 02/14/2023 CLINICAL DATA:  Larey Seat, left shoulder pain EXAM: PORTABLE CHEST 1 VIEW COMPARISON:  12/08/2022 FINDINGS: Single frontal view of the chest demonstrates an unremarkable cardiac silhouette. Lung volumes are diminished, without airspace disease, effusion, or pneumothorax. Comminuted intra-articular left humeral head and neck fracture. No other acute bony abnormalities. IMPRESSION: 1. Comminuted left humeral head and neck fracture. 2. Low lung volumes.  No acute airspace disease. Electronically Signed   By: Sharlet Salina M.D.   On: 02/14/2023 20:01   CT HEAD WO CONTRAST ( )  Result Date: 02/14/2023 CLINICAL DATA:  Head trauma, minor (Age >= 65y); Neck trauma (Age >= 65y). Fall. EXAM: CT HEAD WITHOUT CONTRAST CT CERVICAL SPINE WITHOUT CONTRAST TECHNIQUE: Multidetector CT imaging of the head and cervical spine was performed following the standard  protocol without intravenous contrast. Multiplanar CT image reconstructions of the cervical spine were also generated. RADIATION DOSE REDUCTION: This exam was performed according to the departmental dose-optimization program which includes automated exposure control, adjustment of the mA and/or kV according to patient size and/or use of iterative reconstruction technique. COMPARISON:  Head CT 11/17/2022.  CT cervical spine 09/22/2022. FINDINGS: CT HEAD FINDINGS Brain: No acute hemorrhage. 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 Cortical gray-white differentiation is otherwise preserved. Prominence of the ventricles and sulci within expected range for age. No hydrocephalus or extra-axial collection. No mass effect or midline shift. Vascular: No hyperdense vessel or unexpected calcification. Skull: Prior right frontoparietal craniotomy. No calvarial fracture or suspicious bone lesion. Skull base is unremarkable. Sinuses/Orbits: No acute finding. Other: None. CT CERVICAL SPINE FINDINGS Alignment: Normal. Skull base and vertebrae: No acute fracture. Normal craniocervical junction. No suspicious bone lesions. Soft tissues  and spinal canal: No prevertebral fluid or swelling. No visible canal hematoma. Disc levels: Unchanged multilevel cervical spondylosis, worst at C3-4, where there is at least moderate spinal canal stenosis. Upper chest: No acute findings. Other: Atherosclerotic calcifications of the carotid bulbs. IMPRESSION: 1. No acute intracranial abnormality. 2. No acute cervical spine fracture or traumatic listhesis. 3. Unchanged multilevel cervical spondylosis, worst at C3-4, where there is at least moderate spinal canal stenosis. Electronically Signed   By: Orvan Falconer M.D.   On: 02/14/2023 18:22   CT Cervical Spine Wo Contrast  Result Date: 02/14/2023 CLINICAL DATA:  Head trauma, minor (Age >=  65y); Neck trauma (Age >= 65y). Fall. EXAM: CT HEAD WITHOUT CONTRAST CT CERVICAL SPINE WITHOUT CONTRAST TECHNIQUE: Multidetector CT imaging of the head and cervical spine was performed following the standard protocol without intravenous contrast. Multiplanar CT image reconstructions of the cervical spine were also generated. RADIATION DOSE REDUCTION: This exam was performed according to the departmental dose-optimization program which includes automated exposure control, adjustment of the mA and/or kV according to patient size and/or use of iterative reconstruction technique. COMPARISON:  Head CT 11/17/2022.  CT cervical spine 09/22/2022. FINDINGS: CT HEAD FINDINGS Brain: No acute hemorrhage. 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 Cortical gray-white differentiation is otherwise preserved. Prominence of the ventricles and sulci within expected range for age. No hydrocephalus or extra-axial collection. No mass effect or midline shift. Vascular: No hyperdense vessel or unexpected calcification. Skull: Prior right frontoparietal craniotomy. No calvarial fracture or suspicious bone lesion. Skull base is unremarkable. Sinuses/Orbits: No acute finding. Other: None. CT CERVICAL SPINE FINDINGS Alignment: Normal. Skull base and vertebrae: No acute fracture. Normal craniocervical junction. No suspicious bone lesions. Soft tissues and spinal canal: No prevertebral fluid or swelling. No visible canal hematoma. Disc levels: Unchanged multilevel cervical spondylosis, worst at C3-4, where there is at least moderate spinal canal stenosis. Upper chest: No acute findings. Other: Atherosclerotic calcifications of the carotid bulbs. IMPRESSION: 1. No acute intracranial abnormality. 2. No acute cervical spine fracture or traumatic listhesis. 3. Unchanged multilevel cervical spondylosis, worst at C3-4, where there is at least  moderate spinal canal stenosis. Electronically Signed   By: Orvan Falconer M.D.   On: 02/14/2023 18:22     Family Communication: Discussed with patient, she understands and agrees. All questions answereed.  Disposition: Status is: Inpatient Remains inpatient appropriate because: Rapid response this morning, need tele monitoring.  Planned Discharge Destination: Home with Home Health     MDM level 3- She had rapid response while working with PT. HR elevated, need close telemonitoring. She has pain, kidney function worsening, has electrolyte abnormalities. She is at high risk for sudden clinical deterioration.  Author: Marcelino Duster, MD 02/16/2023 9:38 AM Secure chat 7am to 7pm For on call review [email or web address].

## 2023-02-16 NOTE — Progress Notes (Signed)
   02/16/23 0900  Spiritual Encounters  Type of Visit Initial  Referral source Code page  Reason for visit Code  OnCall Visit Yes   Chaplain responded to Rapid Response Code. Patient receiving care at bedside. No family present.

## 2023-02-16 NOTE — Evaluation (Signed)
Physical Therapy Evaluation Patient Details Name: Adriana Miller MRN: [id number] DOB: [birth year] Today's Date: 02/16/2023  History of Present Illness  Pt is a 67 yo female presenting  with a comminuted L humeral fracture after a fall injuring her L shoulder. PMH includes hx of falls, elevated ETOH levels, alcohol use disorder, recurrent UTIs, HTN, persistant a-fib, CHF with preserved EF, seizures, DM, cerebral aneurysm repair, and stage 3b CKD.   Clinical Impression  Pt alert and oriented x 4 upon entry with 8/10 L shoulder pain at rest (RN aware and in room). Unsure if patient is a reliable historian with no family/caregiver present to determine baseline cognition or function.  Pt reports [place] alone [place]," using a rollator or SPC to ambulate household distances, and requiring assistance from her home health aid every evening for bathing, dressing, preparing meals, and cooking. Pt is quick to answer all subjective questions, but very slow to initiate mobility. HR 107, O2 98% in supine. ModA with heavy verbal cues and bed rail use for supine > sit, pt very fearful with movement but denied dizziness sitting EOB. ModAx2 to stand with hemiwalker, but heavy posterior weightshift noted and pt unable to take lateral steps 2/2 pain and weakness (B/L knee buckling). Upon sitting, pt began leaning to the left and became unresponsive ~1 min, required maxAx2 to return to supine, called a rapid response, provided sternal rub, and pt came to. Left pt responsive with rapid response team in room. Pt would benefit from skilled acute therapy intervention to address functional impairments.      If plan is discharge home, recommend the following: Two people to help with walking and/or transfers;Two people to help with bathing/dressing/bathroom;Direct supervision/assist for financial management;Assist for transportation;Supervision due to cognitive status;Assistance with cooking/housework;Direct  supervision/assist for medications management   Can travel by private vehicle   No    Equipment Recommendations Other (comment) (hemiwalker; TBD)  Recommendations for Other Services       Functional Status Assessment Patient has had a recent decline in their functional status and demonstrates the ability to make significant improvements in function in a reasonable and predictable amount of time.     Precautions / Restrictions Precautions Precautions: Fall Restrictions Weight Bearing Restrictions: No (no orders, LUE NWB during PT)      Mobility  Bed Mobility Overal bed mobility: Needs Assistance Bed Mobility: Supine to Sit, Sit to Supine     Supine to sit: Mod assist, Used rails, HOB elevated Sit to supine: Max assist, +2 for safety/equipment, +2 for physical assistance   General bed mobility comments: very slow to initiate movement and process commands; modA at trunk and BLEs for sup > sit; maxAx2 for sit > sit 2/2 pt becoming unresponsive    Transfers Overall transfer level: Needs assistance Equipment used: Hemi-walker Transfers: Sit to/from Stand Sit to Stand: Mod assist, +2 safety/equipment, +2 physical assistance           General transfer comment: modAx2 with STS for proper sequencing and use of hemi-walker, heavy weighthsift into heels one standing, limited by pain, fear of falling, and weakness    Ambulation/Gait               General Gait Details: deferred: pt became unstable (B/L knee buckling) with attempt to take lateral steps, pt started leaning to the left upon sitting and became unresponsive, returned to supine, called a rapid response, provided sternal rub, and pt came to  [employer]  Wheelchair Mobility     Tilt Bed    Modified Rankin (Stroke Patients Only)       Balance Overall balance assessment: Needs assistance Sitting-balance support: Feet supported, No upper extremity supported Sitting balance-Leahy Scale:  Poor Sitting balance - Comments: CGA with static sitting, progressing to supervision before becoming unresponsive   Standing balance support: Reliant on assistive device for balance, Bilateral upper extremity supported Standing balance-Leahy Scale: Poor Standing balance comment: unable to stand without hemiwalker and modAx2; heavy posterior weightshift                             Pertinent Vitals/Pain Pain Assessment Pain Assessment: 0-10 Pain Score: 8  Pain Location: L shoulder Pain Descriptors / Indicators: Aching, Grimacing, Throbbing, Discomfort Pain Intervention(s): Limited activity within patient's tolerance, Repositioned, Monitored during session, Premedicated before session    Home Living Family/patient expects to be discharged to:: Private residence Living Arrangements: Alone ("in the woods") Available Help at Discharge: Personal care attendant;Available PRN/intermittently (comes every evening) Type of Home: Apartment Home Access: Level entry       Home Layout: One level Home Equipment: Shower seat;Grab bars - toilet;Cane - single point;Rollator (4 wheels);Grab bars - tub/shower      Prior Function Prior Level of Function : Needs assist;History of Falls (last six months)             Mobility Comments: Requires SPC or rollator with all mobility, very unsteady and off-balance. Has home health aide that comes every day. ADLs Comments: per pt report, home health aide assists with bathing, cleaning, dressing (especially putting on her socks), and food prep; she gets Meals on Wheels, and is not driving.     Extremity/Trunk Assessment   Upper Extremity Assessment Upper Extremity Assessment: Defer to OT evaluation    Lower Extremity Assessment Lower Extremity Assessment: Generalized weakness    Cervical / Trunk Assessment Cervical / Trunk Assessment: Normal  Communication   Communication Communication: Difficulty following  commands/understanding Following commands: Follows one step commands with increased time Cueing Techniques: Verbal cues;Tactile cues;Visual cues  Cognition Arousal: Alert Behavior During Therapy: WFL for tasks assessed/performed Overall Cognitive Status: No family/caregiver present to determine baseline cognitive functioning Area of Impairment: Problem solving                             Problem Solving: Slow processing General Comments: Alert and oriented upon entry, but unsure if pt is a reliable historian; able to request/drink orange juice for her blood sugar; slow to process directions        General Comments General comments (skin integrity, edema, etc.): large bruise on anterior L shoulder    Exercises     Assessment/Plan    PT Assessment Patient needs continued PT services  PT Problem List Decreased strength;Decreased mobility;Decreased safety awareness;Decreased range of motion;Decreased knowledge of precautions;Decreased activity tolerance;Decreased cognition;Pain;Decreased knowledge of use of DME;Decreased balance       PT Treatment Interventions DME instruction;Therapeutic exercise;Wheelchair mobility training;Balance training;Gait training;Neuromuscular re-education;Functional mobility training;Cognitive remediation;Patient/family education;Therapeutic activities    PT Goals (Current goals can be found in the Care Plan section)  Acute Rehab PT Goals Patient Stated Goal: decrease the pain PT Goal Formulation: With patient Time For Goal Achievement: 03/02/23 Potential to Achieve Goals: Fair    Frequency Min 1X/week     Co-evaluation  AM-PAC PT "6 Clicks" Mobility  Outcome Measure Help needed turning from your back to your side while in a flat bed without using bedrails?: A Lot Help needed moving from lying on your back to sitting on the side of a flat bed without using bedrails?: A Lot Help needed moving to and from a bed to a  chair (including a wheelchair)?: Total Help needed standing up from a chair using your arms (e.g., wheelchair or bedside chair)?: A Lot Help needed to walk in hospital room?: Total Help needed climbing 3-5 steps with a railing? : Total 6 Click Score: 9    End of Session Equipment Utilized During Treatment: Gait belt Activity Tolerance: Treatment limited secondary to medical complications (Comment) (rapid response called d/t pt becoming unresponsive) Patient left: in bed;with nursing/sitter in room;Other (comment) (with rapid response team) Nurse Communication: Mobility status;Weight bearing status;Precautions;Other (comment) (pt presentation during unresponsive episode; pain levels) PT Visit Diagnosis: Unsteadiness on feet (R26.81);Other abnormalities of gait and mobility (R26.89);Repeated falls (R29.6);History of falling (Z91.81);Muscle weakness (generalized) (M62.81);Difficulty in walking, not elsewhere classified (R26.2);Pain Pain - Right/Left: Left Pain - part of body: Shoulder    Time: 5284-1324 PT Time Calculation (min) (ACUTE ONLY): 25 min   Charges:   PT Evaluation $PT Eval Low Complexity: 1 Low PT Treatments $Therapeutic Activity: 8-22 mins PT General Charges $$ ACUTE PT VISIT: 1 Visit          Shauna Hugh 02/16/2023, 12:30 PM

## 2023-02-17 DIAGNOSIS — W19XXXA Unspecified fall, initial encounter: Secondary | ICD-10-CM | POA: Diagnosis not present

## 2023-02-17 DIAGNOSIS — S42352A Displaced comminuted fracture of shaft of humerus, left arm, initial encounter for closed fracture: Secondary | ICD-10-CM | POA: Diagnosis not present

## 2023-02-17 DIAGNOSIS — N1832 Chronic kidney disease, stage 3b: Secondary | ICD-10-CM | POA: Diagnosis not present

## 2023-02-17 DIAGNOSIS — F1090 Alcohol use, unspecified, uncomplicated: Secondary | ICD-10-CM | POA: Diagnosis not present

## 2023-02-17 DIAGNOSIS — I4819 Other persistent atrial fibrillation: Secondary | ICD-10-CM

## 2023-02-17 LAB — CBC
HCT: 22.9 % — ABNORMAL LOW (ref 36.0–46.0)
Hemoglobin: 7.3 g/dL — ABNORMAL LOW (ref 12.0–15.0)
MCH: 30.9 pg (ref 26.0–34.0)
MCHC: 31.9 g/dL (ref 30.0–36.0)
MCV: 97 fL (ref 80.0–100.0)
Platelets: 127 10*3/uL — ABNORMAL LOW (ref 150–400)
RBC: 2.36 MIL/uL — ABNORMAL LOW (ref 3.87–5.11)
RDW: 15.8 % — ABNORMAL HIGH (ref 11.5–15.5)
WBC: 7.5 10*3/uL (ref 4.0–10.5)
nRBC: 0.3 % — ABNORMAL HIGH (ref 0.0–0.2)

## 2023-02-17 LAB — GLUCOSE, CAPILLARY
Glucose-Capillary: 182 mg/dL — ABNORMAL HIGH (ref 70–99)
Glucose-Capillary: 186 mg/dL — ABNORMAL HIGH (ref 70–99)
Glucose-Capillary: 286 mg/dL — ABNORMAL HIGH (ref 70–99)
Glucose-Capillary: 308 mg/dL — ABNORMAL HIGH (ref 70–99)
Glucose-Capillary: 234 mg/dL — ABNORMAL HIGH (ref 70–99)
Glucose-Capillary: 198 mg/dL — ABNORMAL HIGH (ref 70–99)

## 2023-02-17 LAB — BASIC METABOLIC PANEL
Anion gap: 9 (ref 5–15)
BUN: 49 mg/dL — ABNORMAL HIGH (ref 8–23)
CO2: 24 mmol/L (ref 22–32)
Calcium: 8.3 mg/dL — ABNORMAL LOW (ref 8.9–10.3)
Chloride: 98 mmol/L (ref 98–111)
Creatinine, Ser: 2.41 mg/dL — ABNORMAL HIGH (ref 0.44–1.00)
GFR, Estimated: 21 mL/min — ABNORMAL LOW (ref >60)
Glucose, Bld: 165 mg/dL — ABNORMAL HIGH (ref 70–99)
Potassium: 4 mmol/L (ref 3.5–5.1)
Sodium: 131 mmol/L — ABNORMAL LOW (ref 135–145)

## 2023-02-17 MED ORDER — CEPHALEXIN 250 MG PO CAPS
250.0000 mg | ORAL_CAPSULE | Freq: Two times a day (BID) | ORAL | Status: AC
  Administered 2023-02-17 – 2023-02-19 (×5): 250 mg via ORAL
  Filled 2023-02-17 (×5): qty 1

## 2023-02-17 NOTE — Progress Notes (Signed)
Lower pain medication given due to Blood pressure as per MD's request

## 2023-02-17 NOTE — Plan of Care (Signed)
  Problem: Education: Goal: Knowledge of General Education information will improve Description: Including pain rating scale, medication(s)/side effects and non-pharmacologic comfort measures Outcome: Progressing   Problem: Activity: Goal: Risk for activity intolerance will decrease Outcome: Progressing   Problem: Nutrition: Goal: Adequate nutrition will be maintained Outcome: Progressing   Problem: Coping: Goal: Level of anxiety will decrease Outcome: Progressing   Problem: Pain Management: Goal: General experience of comfort will improve Outcome: Progressing   Problem: Skin Integrity: Goal: Risk for impaired skin integrity will decrease Outcome: Progressing

## 2023-02-17 NOTE — Plan of Care (Signed)

## 2023-02-17 NOTE — Progress Notes (Signed)
Physical Therapy Treatment Patient Details Name: Adriana Miller MRN: [id number] DOB: [birth year] Today's Date: 02/17/2023   History of Present Illness Pt is a 67 yo female presenting  with a comminuted L humeral fracture after a fall injuring her L shoulder. PMH includes hx of falls, elevated ETOH levels, alcohol use disorder, recurrent UTIs, HTN, persistant a-fib, CHF with preserved EF, seizures, DM, cerebral aneurysm repair, and stage 3b CKD.    PT Comments  Pt alert, oriented to self and more talkative today. Did display decreased awareness of L arm precautions and status (mentioned thinking she had only bruised her arm) as well as safety awareness. Improved tolerance to activity today and was able to step pivot to recliner with modAx2, hemiwalker and constant steadying. Noted to still be orthostatic as well so further mobility deferred. PT assisted with doffing/donning sling and gown during session as well, maxA. Nursing staff in room finishing pt's bath at end of session. The patient would benefit from further skilled PT intervention to continue to progress towards goals.     If plan is discharge home, recommend the following: Two people to help with walking and/or transfers;Two people to help with bathing/dressing/bathroom;Direct supervision/assist for financial management;Assist for transportation;Supervision due to cognitive status;Assistance with cooking/housework;Direct supervision/assist for medications management   Can travel by private vehicle     No  Equipment Recommendations  Other (comment) (hemiwalker; TBD)    Recommendations for Other Services       Precautions / Restrictions Precautions Precautions: Fall Required Braces or Orthoses: Sling Restrictions Weight Bearing Restrictions: Yes LUE Weight Bearing: Non weight bearing     Mobility  Bed Mobility Overal bed mobility: Needs Assistance Bed Mobility: Supine to Sit, Sit to Supine     Supine to sit: Mod assist,  +2 for safety/equipment          Transfers Overall transfer level: Needs assistance Equipment used: Hemi-walker Transfers: Sit to/from Stand, Bed to chair/wheelchair/BSC Sit to Stand: Mod assist, +2 safety/equipment, +2 physical assistance   Step pivot transfers: Mod assist, +2 safety/equipment       General transfer comment: seated rest break after initial standing, with second attempt able to step pivot to recliner in room    Ambulation/Gait                   Stairs             Wheelchair Mobility     Tilt Bed    Modified Rankin (Stroke Patients Only)       Balance Overall balance assessment: Needs assistance Sitting-balance support: Feet supported, No upper extremity supported Sitting balance-Leahy Scale: Fair     Standing balance support: Reliant on assistive device for balance, Single extremity supported Standing balance-Leahy Scale: Poor                              Cognition Arousal: Alert Behavior During Therapy: WFL for tasks assessed/performed Overall Cognitive Status: No family/caregiver present to determine baseline cognitive functioning Area of Impairment: Problem solving, Safety/judgement, Awareness, Memory                         Safety/Judgement: Decreased awareness of safety, Decreased awareness of deficits   Problem Solving: Slow processing          Exercises      General Comments        Pertinent Vitals/Pain Pain Assessment Pain Assessment:  Faces Faces Pain Scale: Hurts even more Pain Location: L shoulder Pain Descriptors / Indicators: Aching, Grimacing, Throbbing, Discomfort Pain Intervention(s): Limited activity within patient's tolerance, Monitored during session, Repositioned, RN gave pain meds during session    Home Living                          Prior Function            PT Goals (current goals can now be found in the care plan section) Progress towards PT goals:  Progressing toward goals    Frequency    Min 1X/week      PT Plan      Co-evaluation              AM-PAC PT "6 Clicks" Mobility   Outcome Measure  Help needed turning from your back to your side while in a flat bed without using bedrails?: A Lot Help needed moving from lying on your back to sitting on the side of a flat bed without using bedrails?: A Lot Help needed moving to and from a bed to a chair (including a wheelchair)?: Total Help needed standing up from a chair using your arms (e.g., wheelchair or bedside chair)?: A Lot Help needed to walk in hospital room?: A Lot Help needed climbing 3-5 steps with a railing? : Total 6 Click Score: 10    End of Session Equipment Utilized During Treatment: Gait belt Activity Tolerance: Patient limited by fatigue (pt still orthostatic) Patient left: with nursing/sitter in room;in chair;with call bell/phone within reach;with chair alarm set (with rapid response team) Nurse Communication: Mobility status (pt presentation during unresponsive episode; pain levels) PT Visit Diagnosis: Unsteadiness on feet (R26.81);Other abnormalities of gait and mobility (R26.89);Repeated falls (R29.6);History of falling (Z91.81);Muscle weakness (generalized) (M62.81);Difficulty in walking, not elsewhere classified (R26.2);Pain Pain - Right/Left: Left Pain - part of body: Shoulder     Time: 7829-5621 PT Time Calculation (min) (ACUTE ONLY): 23 min  Charges:    $Therapeutic Activity: 23-37 mins PT General Charges $$ ACUTE PT VISIT: 1 Visit                     Olga Coaster PT, DPT 12:46 PM,02/17/23

## 2023-02-17 NOTE — Progress Notes (Signed)
Progress Note   Patient: Adriana Miller IEP:[id number] DOB: 27-Mar-1956 DOA: 02/14/2023     3 DOS: the patient was seen and examined on 02/17/2023   Brief hospital course: 67 y.o. female with medical history significant for Persistent PAF/flutter on apixaban s/p failed DCCV 12/27/2022, DM2, CKD stage III-IV, cirrhosis  and hepatocellular carcinoma, seizure disorder, cerebral aneurysm status post repair, HTN, and HLD , as well as daily  frailty and frequent falls.  She is found to have left proximal humerus fracture   10/29: Orthopedic seen.  Recommends conservative management. 10/30: Rapid response called as she lost consciousness briefly while working with PT.  10/31 feels better, advised to work with PT for safe disposition plan.  Assessment and Plan: * Comminuted left humeral fracture, closed, initial encounter Left arm in sling. Pain control with tylenol. Advised to limit opioids. Ortho seen.  Recommends conservative management PT and OT follow up.  TOC eval  Accidental fall versus fall related to alcohol intoxication Syncopal episode while working with PT. Orthostatic vitals positive. Place her on cardiac telemetry. EKG stat. CBG 200 Gentle IV fluids. Encourage oral hydration  Acute on CKD stage 3b: Kidney function worsened. Hold Lasix, aldactone. Gentle IV fluids for one day  Elevated ETOH level Alcohol use disorder with possible acute intoxication Alcoholic cirrhosis without ascites EtOH level over 220 on arrival Alcohol cessation counseled Continue CIWA withdrawal protocol. Thiamine, folate, multivitamin. Advised to limit opiates.   History of recurrent UTI (urinary tract infection) Sepsis secondary to E. coli in August 2024 UA concerning for UTI.  Rocephin changed to Keflex to complete 7 days.   Persistent atrial fibrillation (HCC) Chronic anticoagulation History of unsuccessful DCCV in September 2024 Continue metoprolol and apixaban Last seen by cardiology  10/7   Essential hypertension HFpEF Clinically compensated Lasix, spironolactone held, continue metoprolol.  Gentle IV fluids, watch for fluid overload.   Uncontrolled type 2 diabetes mellitus with hyperglycemia, with long-term current use of insulin (HCC) Continue basal insulin with sliding scale coverage Diabetic nurse following.  NovoLog coverage per the recommendation   History of cerebral aneurysm repair No acute issues suspected.  Head CT was nonacute. Continue neurochecks.     Out of bed to chair. Incentive spirometry. Nursing supportive care. Fall, aspiration precautions. DVT prophylaxis   Code Status: Do not attempt resuscitation (DNR) PRE-ARREST INTERVENTIONS DESIRED  Subjective: Patient is seen and examined today morning. She is lying in bed, no complaints of dizziness, headache. Pain better. Advised to work with PT.  Physical Exam: Vitals:   02/16/23 2006 02/16/23 2010 02/17/23 0851 02/17/23 1546  BP: (!) 89/61 (!) 96/56 (!) 108/55 116/76  Pulse: 68 65    Resp:   18 16  Temp: 98.2 F (36.8 C) 98.2 F (36.8 C) 98.9 F (37.2 C) 97.9 F (36.6 C)  TempSrc: Oral Oral    SpO2: 99% 99%  98%  Weight:      Height:        General - Elderly Caucasian female, no apparent distress HEENT - PERRLA, EOMI, atraumatic head, non tender sinuses. Lung - Clear, basal rales, no rhonchi, wheezes. Heart - S1, S2 heard, tachycardia, trace pedal edema. Abdomen - Soft, non tender, bowel sounds good Neuro - Alert, awake and oriented x 3, non focal exam. Skin - Warm and dry. Left shoulder sling.  Data Reviewed:      Latest Ref Rng & Units 02/17/2023    9:26 AM 02/16/2023    4:51 AM 02/15/2023    4:24 AM  CBC  WBC 4.0 - 10.5 K/uL 7.5  7.7  7.5   Hemoglobin 12.0 - 15.0 g/dL 7.3  9.0  54.0   Hematocrit 36.0 - 46.0 % 22.9  27.7  31.3   Platelets 150 - 400 K/uL 127  133  146       Latest Ref Rng & Units 02/17/2023    9:26 AM 02/16/2023    4:51 AM 02/15/2023    4:24 AM   BMP  Glucose 70 - 99 mg/dL 981  191  478   BUN 8 - 23 mg/dL 49  43  33   Creatinine 0.44 - 1.00 mg/dL 2.95  6.21  3.08   Sodium 135 - 145 mmol/L 131  132  135   Potassium 3.5 - 5.1 mmol/L 4.0  5.0  4.6   Chloride 98 - 111 mmol/L 98  96  101   CO2 22 - 32 mmol/L 24  27  24    Calcium 8.9 - 10.3 mg/dL 8.3  8.6  8.2    No results found.   Family Communication: Discussed with patient, she understands and agrees. All questions answereed.  Disposition: Status is: Inpatient Remains inpatient appropriate because: Rapid response this morning, need tele monitoring.  Planned Discharge Destination: Home with Home Health     Time spent 40 min.  Author: Marcelino Duster, MD 02/17/2023 4:07 PM Secure chat 7am to 7pm For on call review [email or web address].

## 2023-02-17 NOTE — Inpatient Diabetes Management (Signed)
Inpatient Diabetes Program Recommendations  AACE/ADA: New Consensus Statement on Inpatient Glycemic Control (2015)  Target Ranges:  Prepandial:   less than 140 mg/dL      Peak postprandial:   less than 180 mg/dL (1-2 hours)      Critically ill patients:  140 - 180 mg/dL   Lab Results  Component Value Date   GLUCAP 308 (H) 02/17/2023   HGBA1C 5.2 12/22/2022    Review of Glycemic Control  Latest Reference Range & Units 02/17/23 07:34 02/17/23 07:58 02/17/23 11:58 02/17/23 12:12  Glucose-Capillary 70 - 99 mg/dL 284 (H) 132 (H) 440 (H) 308 (H)  (H): Data is abnormally high  Diabetes history: DM2 Outpatient Diabetes medications: Toujeo 10 units QD Current orders for Inpatient glycemic control: Semglee 10 units every day, Novolog 0-9 unist TID and 0-5 units QHS  Inpatient Diabetes Program Recommendations:    Postprandials elevated.  Might consider:  Novolog 2-3 units TID with meals if she consumes at least 50%.    Will continue to follow while inpatient.  Thank you, Dulce Sellar, MSN, CDCES Diabetes Coordinator Inpatient Diabetes Program [phone] (team pager from 8a-5p)

## 2023-02-18 DIAGNOSIS — D649 Anemia, unspecified: Secondary | ICD-10-CM | POA: Insufficient documentation

## 2023-02-18 DIAGNOSIS — F1090 Alcohol use, unspecified, uncomplicated: Secondary | ICD-10-CM | POA: Diagnosis not present

## 2023-02-18 DIAGNOSIS — N1832 Chronic kidney disease, stage 3b: Secondary | ICD-10-CM | POA: Diagnosis not present

## 2023-02-18 DIAGNOSIS — E1165 Type 2 diabetes mellitus with hyperglycemia: Secondary | ICD-10-CM | POA: Diagnosis not present

## 2023-02-18 DIAGNOSIS — S42352A Displaced comminuted fracture of shaft of humerus, left arm, initial encounter for closed fracture: Secondary | ICD-10-CM | POA: Diagnosis not present

## 2023-02-18 LAB — BASIC METABOLIC PANEL
Anion gap: 9 (ref 5–15)
BUN: 49 mg/dL — ABNORMAL HIGH (ref 8–23)
CO2: 23 mmol/L (ref 22–32)
Calcium: 8.2 mg/dL — ABNORMAL LOW (ref 8.9–10.3)
Chloride: 98 mmol/L (ref 98–111)
Creatinine, Ser: 2.19 mg/dL — ABNORMAL HIGH (ref 0.44–1.00)
GFR, Estimated: 24 mL/min — ABNORMAL LOW (ref >60)
Glucose, Bld: 275 mg/dL — ABNORMAL HIGH (ref 70–99)
Potassium: 4.2 mmol/L (ref 3.5–5.1)
Sodium: 130 mmol/L — ABNORMAL LOW (ref 135–145)

## 2023-02-18 LAB — IRON AND TIBC
Iron: 19 ug/dL — ABNORMAL LOW (ref 28–170)
Saturation Ratios: 7 % — ABNORMAL LOW (ref 10.4–31.8)
TIBC: 270 ug/dL (ref 250–450)
UIBC: 251 ug/dL

## 2023-02-18 LAB — GLUCOSE, CAPILLARY
Glucose-Capillary: 246 mg/dL — ABNORMAL HIGH (ref 70–99)
Glucose-Capillary: 288 mg/dL — ABNORMAL HIGH (ref 70–99)
Glucose-Capillary: 208 mg/dL — ABNORMAL HIGH (ref 70–99)
Glucose-Capillary: 216 mg/dL — ABNORMAL HIGH (ref 70–99)

## 2023-02-18 LAB — CBC
HCT: 20.2 % — ABNORMAL LOW (ref 36.0–46.0)
Hemoglobin: 6.4 g/dL — ABNORMAL LOW (ref 12.0–15.0)
MCH: 31.5 pg (ref 26.0–34.0)
MCHC: 31.7 g/dL (ref 30.0–36.0)
MCV: 99.5 fL (ref 80.0–100.0)
Platelets: 112 10*3/uL — ABNORMAL LOW (ref 150–400)
RBC: 2.03 MIL/uL — ABNORMAL LOW (ref 3.87–5.11)
RDW: 15.4 % (ref 11.5–15.5)
WBC: 6.7 10*3/uL (ref 4.0–10.5)
nRBC: 0 % (ref 0.0–0.2)

## 2023-02-18 LAB — VITAMIN B12: Vitamin B-12: 551 pg/mL (ref 180–914)

## 2023-02-18 LAB — ABO/RH: ABO/RH(D): O POS

## 2023-02-18 LAB — PREPARE RBC (CROSSMATCH)

## 2023-02-18 LAB — FERRITIN: Ferritin: 82 ng/mL (ref 11–307)

## 2023-02-18 LAB — FOLATE: Folate: 21.8 ng/mL (ref >5.9)

## 2023-02-18 MED ORDER — SODIUM CHLORIDE 0.9% IV SOLUTION
Freq: Once | INTRAVENOUS | Status: AC
Start: 2023-02-18 — End: 2023-02-18

## 2023-02-18 MED ORDER — DOCUSATE SODIUM 100 MG PO CAPS
100.0000 mg | ORAL_CAPSULE | Freq: Two times a day (BID) | ORAL | Status: DC
  Administered 2023-02-18 – 2023-02-23 (×10): 100 mg via ORAL
  Filled 2023-02-18 (×10): qty 1

## 2023-02-18 MED ORDER — METOPROLOL TARTRATE 25 MG PO TABS
12.5000 mg | ORAL_TABLET | Freq: Two times a day (BID) | ORAL | Status: DC
  Administered 2023-02-18 – 2023-02-28 (×19): 12.5 mg via ORAL
  Filled 2023-02-18 (×20): qty 1

## 2023-02-18 MED ORDER — SENNA 8.6 MG PO TABS
2.0000 | ORAL_TABLET | Freq: Every day | ORAL | Status: DC
  Administered 2023-02-18 – 2023-02-27 (×9): 17.2 mg via ORAL
  Filled 2023-02-18 (×9): qty 2

## 2023-02-18 MED ORDER — VITAMIN C 500 MG PO TABS
500.0000 mg | ORAL_TABLET | Freq: Every day | ORAL | Status: DC
  Administered 2023-02-18 – 2023-02-28 (×11): 500 mg via ORAL
  Filled 2023-02-18 (×11): qty 1

## 2023-02-18 MED ORDER — FERROUS SULFATE 325 (65 FE) MG PO TABS
325.0000 mg | ORAL_TABLET | Freq: Two times a day (BID) | ORAL | Status: DC
  Administered 2023-02-18 – 2023-02-28 (×20): 325 mg via ORAL
  Filled 2023-02-18 (×20): qty 1

## 2023-02-18 MED ORDER — POLYETHYLENE GLYCOL 3350 17 G PO PACK
17.0000 g | PACK | Freq: Every day | ORAL | Status: DC
  Administered 2023-02-18 – 2023-02-28 (×7): 17 g via ORAL
  Filled 2023-02-18 (×10): qty 1

## 2023-02-18 NOTE — Progress Notes (Signed)
PT Cancellation Note  Patient Details Name: Adriana Miller MRN: [id number] DOB: 1956/04/06   Cancelled Treatment:    Reason Eval/Treat Not Completed: Other (comment). Pt receiving transfusion, OOB mobility deferred at this time.   Olga Coaster PT, DPT 1:37 PM,02/18/23

## 2023-02-18 NOTE — Plan of Care (Signed)
  Problem: Clinical Measurements: Goal: Diagnostic test results will improve Outcome: Progressing   Problem: Respiratory: Goal: Ability to maintain adequate ventilation will improve Outcome: Progressing   Problem: Clinical Measurements: Goal: Ability to maintain clinical measurements within normal limits will improve Outcome: Progressing   Problem: Nutrition: Goal: Adequate nutrition will be maintained Outcome: Progressing   Problem: Safety: Goal: Ability to remain free from injury will improve Outcome: Progressing   Problem: Skin Integrity: Goal: Risk for impaired skin integrity will decrease Outcome: Progressing

## 2023-02-18 NOTE — Plan of Care (Signed)

## 2023-02-18 NOTE — Progress Notes (Signed)
Progress Note   Patient: Adriana Miller ZOX:[id number] DOB: 28-Sep-1955 DOA: 02/14/2023     4 DOS: the patient was seen and examined on 02/18/2023   Brief hospital course: 67 y.o. female with medical history significant for Persistent PAF/flutter on apixaban s/p failed DCCV 12/27/2022, DM2, CKD stage III-IV, cirrhosis  and hepatocellular carcinoma, seizure disorder, cerebral aneurysm status post repair, HTN, and HLD , as well as daily  frailty and frequent falls.  She is found to have left proximal humerus fracture   10/29: Orthopedic seen.  Recommends conservative management. 10/30: Rapid response called as she lost consciousness briefly while working with PT.  10/31: feels better, advised to work with PT for safe disposition plan. 11/1: Hb drop noted, no active bleeding though on Eliquis. 1 Unit PRBC ordered.  Assessment and Plan: * Comminuted left humeral fracture, closed, initial encounter Left arm in sling. Pain control with tylenol. Advised to limit opioids. Ortho seen advised conservative management PT and OT follow up.  TOC eval for disposition.  Accidental fall versus fall related to alcohol intoxication Syncopal episode in hospital. Possibly dehydration. Orthostatic vitals improving. Continue telemetry. Stopped IV fluids. Encourage oral hydration.  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 Hold Lasix, aldactone. Gentle IV fluids for one day.  Acute on Chronic Anemia: Hb 6.4. Will give a unit of PRBC. There is some component of hemodilution. But no active bleeding. Iron profile reviewed. Iron level low. Will give supplements with vit C. Will continue Eliquis as she denies any active bleeding. Continue to monitor H/H.  Elevated ETOH level Alcohol use disorder with possible acute intoxication Alcoholic cirrhosis without ascites EtOH level over 220 on arrival Alcohol cessation counseled Continue CIWA withdrawal protocol. Thiamine, folate,  multivitamin. Advised to limit opiates.   History of recurrent UTI (urinary tract infection) Sepsis secondary to E. coli in August 2024 UA concerning for UTI.  Continue Keflex to complete 7 days.   Persistent atrial fibrillation (HCC) Chronic anticoagulation History of unsuccessful DCCV in September 2024 Continue metoprolol and apixaban Last seen by cardiology 10/7   Essential hypertension HFpEF Clinically compensated Lasix, spironolactone held, continue metoprolol.  Gentle IV fluids, watch for fluid overload.   Uncontrolled type 2 diabetes mellitus with hyperglycemia, with long-term current use of insulin (HCC) Continue basal insulin with sliding scale coverage Diabetic nurse following.  NovoLog coverage per the recommendation   History of cerebral aneurysm repair No acute issues suspected.  Head CT was nonacute. Continue neurochecks.     Out of bed to chair. Incentive spirometry. Nursing supportive care. Fall, aspiration precautions. DVT prophylaxis   Code Status: Do not attempt resuscitation (DNR) PRE-ARREST INTERVENTIONS DESIRED  Subjective: Patient is seen and examined today morning. She is lying in bed, eating fair. Hb low noted, no active bleeding per her other than skin easy bruising. Advised to work with PT.  Physical Exam: Vitals:   02/18/23 1214 02/18/23 1227 02/18/23 1229 02/18/23 1531  BP: (!) 106/47 (!) 102/53 (!) 102/53 106/71  Pulse: 100  (!) 108 (!) 109  Resp: 15 17 17 15   Temp: 98.6 F (37 C) 98.6 F (37 C) 98.5 F (36.9 C) 98 F (36.7 C)  TempSrc: Oral  Oral   SpO2:  98% 98% 100%  Weight:      Height:        General - Elderly Caucasian female, no apparent distress HEENT - PERRLA, EOMI, atraumatic head, non tender sinuses. Lung - Clear, basal rales, no rhonchi, wheezes. Heart - S1,  S2 heard, tachycardia, trace pedal edema. Abdomen - Soft, non tender, bowel sounds good Neuro - Alert, awake and oriented x 3, non focal exam. Skin - Warm and  dry. Left shoulder sling.  Data Reviewed:      Latest Ref Rng & Units 02/18/2023    2:40 AM 02/17/2023    9:26 AM 02/16/2023    4:51 AM  CBC  WBC 4.0 - 10.5 K/uL 6.7  7.5  7.7   Hemoglobin 12.0 - 15.0 g/dL 6.4  7.3  9.0   Hematocrit 36.0 - 46.0 % 20.2  22.9  27.7   Platelets 150 - 400 K/uL 112  127  133       Latest Ref Rng & Units 02/18/2023    2:40 AM 02/17/2023    9:26 AM 02/16/2023    4:51 AM  BMP  Glucose 70 - 99 mg/dL 161  096  045   BUN 8 - 23 mg/dL 49  49  43   Creatinine 0.44 - 1.00 mg/dL 4.09  8.11  9.14   Sodium 135 - 145 mmol/L 130  131  132   Potassium 3.5 - 5.1 mmol/L 4.2  4.0  5.0   Chloride 98 - 111 mmol/L 98  98  96   CO2 22 - 32 mmol/L 23  24  27    Calcium 8.9 - 10.3 mg/dL 8.2  8.3  8.6    No results found.   Family Communication: Discussed with patient, she understands and agrees. All questions answereed.  Disposition: Status is: Inpatient Remains inpatient appropriate because: low hemoglobin, need blood transfusion, tele monitoring.  Planned Discharge Destination: Home with Home Health     Time spent 38 min.  Author: Marcelino Duster, MD 02/18/2023 4:18 PM Secure chat 7am to 7pm For on call review [email or web address].

## 2023-02-18 NOTE — Care Management Important Message (Signed)
Important Message  Patient Details  Name: Adriana Miller MRN: [id number] Date of Birth: 09-22-1955   Important Message Given:  N/A - LOS <3 / Initial given by admissions     [name] 02/18/2023, 10:25 AM

## 2023-02-19 DIAGNOSIS — E1165 Type 2 diabetes mellitus with hyperglycemia: Secondary | ICD-10-CM | POA: Diagnosis not present

## 2023-02-19 DIAGNOSIS — F1090 Alcohol use, unspecified, uncomplicated: Secondary | ICD-10-CM | POA: Diagnosis not present

## 2023-02-19 DIAGNOSIS — S42352A Displaced comminuted fracture of shaft of humerus, left arm, initial encounter for closed fracture: Secondary | ICD-10-CM | POA: Diagnosis not present

## 2023-02-19 DIAGNOSIS — N1832 Chronic kidney disease, stage 3b: Secondary | ICD-10-CM | POA: Diagnosis not present

## 2023-02-19 LAB — BASIC METABOLIC PANEL
Anion gap: 7 (ref 5–15)
BUN: 46 mg/dL — ABNORMAL HIGH (ref 8–23)
CO2: 26 mmol/L (ref 22–32)
Calcium: 8.3 mg/dL — ABNORMAL LOW (ref 8.9–10.3)
Chloride: 98 mmol/L (ref 98–111)
Creatinine, Ser: 1.75 mg/dL — ABNORMAL HIGH (ref 0.44–1.00)
GFR, Estimated: 31 mL/min — ABNORMAL LOW (ref >60)
Glucose, Bld: 214 mg/dL — ABNORMAL HIGH (ref 70–99)
Potassium: 4.4 mmol/L (ref 3.5–5.1)
Sodium: 131 mmol/L — ABNORMAL LOW (ref 135–145)

## 2023-02-19 LAB — GLUCOSE, CAPILLARY
Glucose-Capillary: 212 mg/dL — ABNORMAL HIGH (ref 70–99)
Glucose-Capillary: 231 mg/dL — ABNORMAL HIGH (ref 70–99)
Glucose-Capillary: 284 mg/dL — ABNORMAL HIGH (ref 70–99)
Glucose-Capillary: 247 mg/dL — ABNORMAL HIGH (ref 70–99)

## 2023-02-19 LAB — TYPE AND SCREEN
ABO/RH(D): O POS
Antibody Screen: NEGATIVE
Unit division: 0

## 2023-02-19 LAB — CBC
HCT: 21.1 % — ABNORMAL LOW (ref 36.0–46.0)
Hemoglobin: 7 g/dL — ABNORMAL LOW (ref 12.0–15.0)
MCH: 30.8 pg (ref 26.0–34.0)
MCHC: 33.2 g/dL (ref 30.0–36.0)
MCV: 93 fL (ref 80.0–100.0)
Platelets: 122 10*3/uL — ABNORMAL LOW (ref 150–400)
RBC: 2.27 MIL/uL — ABNORMAL LOW (ref 3.87–5.11)
RDW: 15.4 % (ref 11.5–15.5)
WBC: 5.6 10*3/uL (ref 4.0–10.5)
nRBC: 1.4 % — ABNORMAL HIGH (ref 0.0–0.2)

## 2023-02-19 LAB — BPAM RBC
Blood Product Expiration Date: 202411282359
ISSUE DATE / TIME: 202411011212
Unit Type and Rh: 5100

## 2023-02-19 MED ORDER — SODIUM CHLORIDE 0.9 % IV SOLN
100.0000 mg | INTRAVENOUS | Status: AC
  Administered 2023-02-19 – 2023-02-21 (×3): 100 mg via INTRAVENOUS
  Filled 2023-02-19 (×3): qty 5

## 2023-02-19 NOTE — Plan of Care (Signed)
  Problem: Education: Goal: Knowledge of General Education information will improve Description: Including pain rating scale, medication(s)/side effects and non-pharmacologic comfort measures Outcome: Progressing   Problem: Health Behavior/Discharge Planning: Goal: Ability to manage health-related needs will improve Outcome: Progressing   Problem: Activity: Goal: Risk for activity intolerance will decrease Outcome: Progressing   Problem: Nutrition: Goal: Adequate nutrition will be maintained Outcome: Progressing   Problem: Coping: Goal: Level of anxiety will decrease Outcome: Progressing   Problem: Elimination: Goal: Will not experience complications related to urinary retention Outcome: Progressing   Problem: Pain Management: Goal: General experience of comfort will improve Outcome: Progressing

## 2023-02-19 NOTE — Plan of Care (Signed)
  Problem: Clinical Measurements: Goal: Diagnostic test results will improve Outcome: Progressing   Problem: Education: Goal: Knowledge of General Education information will improve Description: Including pain rating scale, medication(s)/side effects and non-pharmacologic comfort measures Outcome: Progressing   Problem: Health Behavior/Discharge Planning: Goal: Ability to manage health-related needs will improve Outcome: Progressing   

## 2023-02-19 NOTE — Progress Notes (Signed)
Progress Note   Patient: Adriana Miller:[id number] DOB: 28-Dec-1955 DOA: 02/14/2023     5 DOS: the patient was seen and examined on 02/19/2023   Brief hospital course: 67 y.o. female with medical history significant for Persistent PAF/flutter on apixaban s/p failed DCCV 12/27/2022, DM2, CKD stage III-IV, cirrhosis  and hepatocellular carcinoma, seizure disorder, cerebral aneurysm status post repair, HTN, and HLD , as well as daily  frailty and frequent falls.  She is found to have left proximal humerus fracture   10/29: Orthopedic seen.  Recommends conservative management. 10/30: Rapid response called as she lost consciousness briefly while working with PT.  10/31: feels better, advised to work with PT for safe disposition plan. 11/1: Hb drop noted, no active bleeding though on Eliquis. 1 Unit PRBC ordered. 11/2: Hb not much improved, 7 today after blood transfusion. She denies any active bleeding, did not get out of bed.  Assessment and Plan: * Comminuted left humeral fracture, closed, initial encounter Left arm in sling. Has large bruise. ? Bleeding at trauma site. Pain control with tylenol. Advised to limit opioids. Ortho seen advised conservative management PT and OT follow up.  TOC eval for disposition.  Accidental fall versus fall related to alcohol intoxication Syncopal episode in hospital. Possibly from dehydration, acute anemia. Orthostatic vitals improving. Continue telemetry. Encourage oral hydration.  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 Continue to hold Lasix, aldactone. Stopped IV fluids.  Acute on Chronic Anemia: Hb 7 today. got 1 unit PRBC yesterday. Possible bleed at left shoulder trauma site. Iron level low. I will give 3 days of IV iron. She may need more transfusion. Check stool occult. Will hold Eliquis as she probably bleeding left shoulder site, large bruise noted. Continue to monitor H/H. Transfuse for Hb less than 7.  Elevated ETOH  level Alcohol use disorder with possible acute intoxication Alcoholic cirrhosis without ascites EtOH level over 220 on arrival. Alcohol cessation counseled Continue CIWA protocol. Thiamine, folate, multivitamin. Advised to limit opiates.   History of recurrent UTI (urinary tract infection) Sepsis secondary to E. coli in August 2024 UA concerning for UTI.  finished antibiotics for total 7 days.   Persistent atrial fibrillation (HCC) Chronic anticoagulation History of unsuccessful DCCV in September 2024 Continue metoprolol. Held apixaban due to anemia. Last seen by cardiology 10/7   Essential hypertension HFpEF Clinically compensated Lasix, spironolactone held, continue metoprolol.    Uncontrolled type 2 diabetes mellitus with hyperglycemia, with long-term current use of insulin (HCC) Continue Semglee 10 with sliding scale coverage Diabetes education provided. Weight reduction advised.   History of cerebral aneurysm repair No acute issues suspected.  Head CT was nonacute.  Has constipation on senna, colace, laxative, will give soap sud enema. Out of bed to chair. Incentive spirometry. Nursing supportive care. Fall, aspiration precautions. DVT prophylaxis   Code Status: Do not attempt resuscitation (DNR) PRE-ARREST INTERVENTIONS DESIRED  Subjective: Patient is seen and examined today morning. She is lying in bed, eating fair. Hb did not come up after 1 unit PRBC. Left shoulder/ arm bruise noted. She is eating good. Has constipation. Advised to work with PT.  Physical Exam: Vitals:   02/18/23 1531 02/18/23 1629 02/18/23 2258 02/19/23 0751  BP: 106/71 (!) 127/57 131/74 115/60  Pulse: (!) 109 100 (!) 107 96  Resp: 15 20 18 16   Temp: 98 F (36.7 C) 97.9 F (36.6 C) 98.6 F (37 C) 98.6 F (37 C)  TempSrc:   Oral   SpO2: 100% 100%  100% 100%  Weight:      Height:       General - Elderly Caucasian female, no apparent distress HEENT - PERRLA, EOMI, atraumatic head, non  tender sinuses. Lung - Clear, basal rales, no rhonchi, wheezes. Heart - S1, S2 heard, tachycardia, trace pedal edema. Abdomen - Soft, non tender, obese, bowel sounds good Neuro - Alert, awake and oriented x 3, non focal exam. Skin - Warm and dry. Left shoulder, arm bruise noted. Sling intact.  Data Reviewed:      Latest Ref Rng & Units 02/19/2023    3:29 AM 02/18/2023    2:40 AM 02/17/2023    9:26 AM  CBC  WBC 4.0 - 10.5 K/uL 5.6  6.7  7.5   Hemoglobin 12.0 - 15.0 g/dL 7.0  6.4  7.3   Hematocrit 36.0 - 46.0 % 21.1  20.2  22.9   Platelets 150 - 400 K/uL 122  112  127       Latest Ref Rng & Units 02/19/2023    3:29 AM 02/18/2023    2:40 AM 02/17/2023    9:26 AM  BMP  Glucose 70 - 99 mg/dL 161  096  045   BUN 8 - 23 mg/dL 46  49  49   Creatinine 0.44 - 1.00 mg/dL 4.09  8.11  9.14   Sodium 135 - 145 mmol/L 131  130  131   Potassium 3.5 - 5.1 mmol/L 4.4  4.2  4.0   Chloride 98 - 111 mmol/L 98  98  98   CO2 22 - 32 mmol/L 26  23  24    Calcium 8.9 - 10.3 mg/dL 8.3  8.2  8.3    No results found.   Family Communication: Discussed with patient, she understands and agrees. All questions answereed.  Disposition: Status is: Inpatient Remains inpatient appropriate because: low hemoglobin, need close H/H, tele monitoring.  Planned Discharge Destination: Home with Home Health     Time spent 40 min.  Author: Marcelino Duster, MD 02/19/2023 11:59 AM Secure chat 7am to 7pm For on call review [email or web address].

## 2023-02-19 NOTE — Plan of Care (Signed)
  Problem: Clinical Measurements: Goal: Diagnostic test results will improve Outcome: Progressing   

## 2023-02-20 DIAGNOSIS — S42352A Displaced comminuted fracture of shaft of humerus, left arm, initial encounter for closed fracture: Secondary | ICD-10-CM | POA: Diagnosis not present

## 2023-02-20 DIAGNOSIS — F1090 Alcohol use, unspecified, uncomplicated: Secondary | ICD-10-CM | POA: Diagnosis not present

## 2023-02-20 DIAGNOSIS — N1832 Chronic kidney disease, stage 3b: Secondary | ICD-10-CM | POA: Diagnosis not present

## 2023-02-20 DIAGNOSIS — E1165 Type 2 diabetes mellitus with hyperglycemia: Secondary | ICD-10-CM | POA: Diagnosis not present

## 2023-02-20 LAB — GLUCOSE, CAPILLARY
Glucose-Capillary: 148 mg/dL — ABNORMAL HIGH (ref 70–99)
Glucose-Capillary: 266 mg/dL — ABNORMAL HIGH (ref 70–99)
Glucose-Capillary: 343 mg/dL — ABNORMAL HIGH (ref 70–99)
Glucose-Capillary: 208 mg/dL — ABNORMAL HIGH (ref 70–99)

## 2023-02-20 LAB — CBC
HCT: 23.8 % — ABNORMAL LOW (ref 36.0–46.0)
Hemoglobin: 8 g/dL — ABNORMAL LOW (ref 12.0–15.0)
MCH: 32.4 pg (ref 26.0–34.0)
MCHC: 33.6 g/dL (ref 30.0–36.0)
MCV: 96.4 fL (ref 80.0–100.0)
Platelets: 160 10*3/uL (ref 150–400)
RBC: 2.47 MIL/uL — ABNORMAL LOW (ref 3.87–5.11)
RDW: 16.2 % — ABNORMAL HIGH (ref 11.5–15.5)
WBC: 8.2 10*3/uL (ref 4.0–10.5)
nRBC: 1 % — ABNORMAL HIGH (ref 0.0–0.2)

## 2023-02-20 LAB — BASIC METABOLIC PANEL
Anion gap: 6 (ref 5–15)
BUN: 34 mg/dL — ABNORMAL HIGH (ref 8–23)
CO2: 28 mmol/L (ref 22–32)
Calcium: 8.5 mg/dL — ABNORMAL LOW (ref 8.9–10.3)
Chloride: 100 mmol/L (ref 98–111)
Creatinine, Ser: 1.45 mg/dL — ABNORMAL HIGH (ref 0.44–1.00)
GFR, Estimated: 39 mL/min — ABNORMAL LOW (ref >60)
Glucose, Bld: 136 mg/dL — ABNORMAL HIGH (ref 70–99)
Potassium: 4.3 mmol/L (ref 3.5–5.1)
Sodium: 134 mmol/L — ABNORMAL LOW (ref 135–145)

## 2023-02-20 NOTE — Progress Notes (Signed)
Physical Therapy Treatment Patient Details Name: Adriana Miller MRN: [id number] DOB: 1956-01-08 Today's Date: 02/20/2023   History of Present Illness Pt is a 67 yo female presenting  with a comminuted L humeral fracture after a fall injuring her L shoulder. PMH includes hx of falls, elevated ETOH levels, alcohol use disorder, recurrent UTIs, HTN, persistant a-fib, CHF with preserved EF, seizures, DM, cerebral aneurysm repair, and stage 3b CKD.    PT Comments  Pt. received in bed with Nurse by her side. Pt reported of high pain levels 5/10. Pt agreeable to PT interventions. PT measure BO in 3 positions: supine 129/57, Sitting 124/74, Standing 112/81. Asymptomatic through out the session. Pt needed Mx assist for supine to sit with HOB elevated. STS with Mod to max assist. Took 3-4 steps to sit in the chair with Mod to max assist using HW and Cues for sequencing and directions. Pt had received enema last evening and was having constant BM with change of position. Pt left in chair with nurse fro continued hygiene care. Current POC remains appropriate.    If plan is discharge home, recommend the following: A lot of help with walking and/or transfers;Two people to help with bathing/dressing/bathroom;Assistance with cooking/housework;Direct supervision/assist for medications management;Assist for transportation;Help with stairs or ramp for entrance   Can travel by private vehicle     No  Equipment Recommendations  None recommended by PT    Recommendations for Other Services       Precautions / Restrictions Precautions Precautions: Fall Restrictions Weight Bearing Restrictions: Yes LUE Weight Bearing: Non weight bearing     Mobility  Bed Mobility Overal bed mobility: Needs Assistance Bed Mobility: Supine to Sit     Supine to sit: Max assist     General bed mobility comments: very slow to initiate movement and process commands; modA at trunk and BLEs for sup > sit; maxAx2 for sit > sit  2/2 pt becoming unresponsive    Transfers Overall transfer level: Needs assistance Equipment used: Hemi-walker Transfers: Sit to/from Stand, Bed to chair/wheelchair/BSC Sit to Stand: Mod assist, From elevated surface   Step pivot transfers: Mod assist, Max assist       General transfer comment: slow, unsteady and forward flexed posutre.    Ambulation/Gait               General Gait Details: deferred 2/2 to constnat BM.   Stairs             Wheelchair Mobility     Tilt Bed    Modified Rankin (Stroke Patients Only)       Balance Overall balance assessment: Needs assistance Sitting-balance support: Feet supported, No upper extremity supported Sitting balance-Leahy Scale: Good     Standing balance support: Single extremity supported Standing balance-Leahy Scale: Fair                              Cognition Arousal: Alert Behavior During Therapy: WFL for tasks assessed/performed Overall Cognitive Status: No family/caregiver present to determine baseline cognitive functioning Area of Impairment: Problem solving, Safety/judgement, Awareness, Memory                               General Comments: Alert and oriented upon entry, cooperative and pleasant.        Exercises      General Comments General comments (skin integrity, edema, etc.): bruises noted  in L shoulder area      Pertinent Vitals/Pain Pain Assessment Pain Assessment: 0-10 Pain Score: 5  Pain Location: L shoulder Pain Descriptors / Indicators: Aching, Grimacing, Throbbing, Discomfort Pain Intervention(s): Limited activity within patient's tolerance, Monitored during session    Home Living                          Prior Function            PT Goals (current goals can now be found in the care plan section) Acute Rehab PT Goals Patient Stated Goal: decrease the pain PT Goal Formulation: With patient Time For Goal Achievement:  03/02/23 Potential to Achieve Goals: Fair Progress towards PT goals: Progressing toward goals    Frequency    7X/week      PT Plan      Co-evaluation              AM-PAC PT "6 Clicks" Mobility   Outcome Measure  Help needed turning from your back to your side while in a flat bed without using bedrails?: A Lot Help needed moving from lying on your back to sitting on the side of a flat bed without using bedrails?: A Lot Help needed moving to and from a bed to a chair (including a wheelchair)?: A Lot Help needed standing up from a chair using your arms (e.g., wheelchair or bedside chair)?: A Lot Help needed to walk in hospital room?: A Lot Help needed climbing 3-5 steps with a railing? : Total 6 Click Score: 11    End of Session Equipment Utilized During Treatment: Gait belt Activity Tolerance: Patient limited by pain;Other (comment) (BM 2/2 to Enema) Patient left: in chair;with chair alarm set;with call bell/phone within reach;with nursing/sitter in room Nurse Communication: Mobility status PT Visit Diagnosis: Unsteadiness on feet (R26.81);Other abnormalities of gait and mobility (R26.89);Repeated falls (R29.6);History of falling (Z91.81);Muscle weakness (generalized) (M62.81);Difficulty in walking, not elsewhere classified (R26.2);Pain Pain - Right/Left: Left Pain - part of body: Shoulder     Time: 6213-0865 PT Time Calculation (min) (ACUTE ONLY): 29 min  Charges:    $Therapeutic Activity: 23-37 mins PT General Charges $$ ACUTE PT VISIT: 1 Visit          Quantavious Eggert PT DPT 1:00 PM,02/20/23

## 2023-02-20 NOTE — Progress Notes (Signed)
Progress Note   Patient: Adriana Miller UJW:[id number] DOB: [birth year] DOA: 02/14/2023     6 DOS: the patient was seen and examined on 02/20/2023   Brief hospital course: 67 y.o. female with medical history significant for Persistent PAF/flutter on apixaban s/p failed DCCV 12/27/2022, DM2, CKD stage III-IV, cirrhosis  and hepatocellular carcinoma, seizure disorder, cerebral aneurysm status post repair, HTN, and HLD , as well as daily  frailty and frequent falls.  She is found to have left proximal humerus fracture   10/29: Orthopedic seen.  Recommends conservative management. 10/30: Rapid response called as she lost consciousness briefly while working with PT.  10/31: feels better, advised to work with PT for safe disposition plan. 11/1: Hb drop noted, no active bleeding though on Eliquis. 1 Unit PRBC ordered. 11/2: Hb not much improved, 7 today after blood transfusion. She denies any active bleeding, did not get out of bed. 11/3: Hb 8, feels much improved, worked with PT recommended SNF placement.  Assessment and Plan: * Comminuted left humeral fracture, closed, initial encounter Left arm in sling. Has large bruise. Hb stable. Pain control with tylenol. Advised to limit opioids. Ortho team advised conservative management PT advised SNF placement.  TOC to work n safe disposition plan.  Accidental fall versus fall related to alcohol intoxication Syncopal episode in hospital. Possibly from dehydration, acute anemia. Orthostatic vitals improving. Continue telemetry. Encourage oral hydration.  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 Continue to hold Lasix, aldactone.  Acute on Chronic Anemia: Hb 8 today. got 1 unit PRBC 11/1. Iron level low. Continue 3 days of IV iron. Check stool occult. Hold Eliquis for drop in H/H, large bruise at trauma site. Continue to monitor H/H. Transfuse for Hb less than 7.  Elevated ETOH level Alcohol use disorder with  possible acute intoxication Alcoholic cirrhosis without ascites EtOH level over 220 on arrival. Alcohol cessation counseled No withdrawal symptoms. Thiamine, folate, multivitamin. Advised to limit opiates.   History of recurrent UTI (urinary tract infection) Sepsis secondary to E. coli in August 2024 UA concerning for UTI.  finished antibiotics total 7 days.   Persistent atrial fibrillation (HCC) Chronic anticoagulation History of unsuccessful DCCV in September 2024 Continue metoprolol. Held apixaban due to anemia. Last seen by cardiology 10/7   Essential hypertension HFpEF Clinically compensated Lasix, spironolactone held, continue metoprolol.    Uncontrolled type 2 diabetes mellitus with hyperglycemia, with long-term current use of insulin (HCC) Continue Semglee 10 with sliding scale coverage Diabetes education provided. Weight reduction advised.   History of cerebral aneurysm repair No acute issues suspected.  Head CT was nonacute.  Has constipation on senna, colace, laxative, will give soap sud enema. Out of bed to chair. Incentive spirometry. Nursing supportive care. Fall, aspiration precautions. DVT prophylaxis   Code Status: Do not attempt resuscitation (DNR) PRE-ARREST INTERVENTIONS DESIRED  Subjective: Patient is seen and examined today morning. She feels better today, no dizziness. Advised out of bed, work with PT.  Physical Exam: Vitals:   02/19/23 0751 02/19/23 1659 02/19/23 2259 02/20/23 0800  BP: 115/60 135/85 114/74 115/73  Pulse: 96 92 96 89  Resp: 16 16 16 18   Temp: 98.6 F (37 C) 98.9 F (37.2 C) 98.3 F (36.8 C) 98.3 F (36.8 C)  TempSrc:   Oral   SpO2: 100% 100%  100%  Weight:      Height:       General - Elderly Caucasian female, no apparent distress HEENT - PERRLA, EOMI,  atraumatic head, non tender sinuses. Lung - Clear, basal rales, no rhonchi, wheezes. Heart - S1, S2 heard, tachycardia, trace pedal edema. Abdomen - Soft, non tender,  obese, bowel sounds good Neuro - Alert, awake and oriented x 3, non focal exam. Skin - Warm and dry. Left shoulder, arm bruise noted. Sling intact.  Data Reviewed:      Latest Ref Rng & Units 02/20/2023    7:40 AM 02/19/2023    3:29 AM 02/18/2023    2:40 AM  CBC  WBC 4.0 - 10.5 K/uL 8.2  5.6  6.7   Hemoglobin 12.0 - 15.0 g/dL 8.0  7.0  6.4   Hematocrit 36.0 - 46.0 % 23.8  21.1  20.2   Platelets 150 - 400 K/uL 160  122  112       Latest Ref Rng & Units 02/20/2023    7:40 AM 02/19/2023    3:29 AM 02/18/2023    2:40 AM  BMP  Glucose 70 - 99 mg/dL 621  308  657   BUN 8 - 23 mg/dL 34  46  49   Creatinine 0.44 - 1.00 mg/dL 8.46  9.62  9.52   Sodium 135 - 145 mmol/L 134  131  130   Potassium 3.5 - 5.1 mmol/L 4.3  4.4  4.2   Chloride 98 - 111 mmol/L 100  98  98   CO2 22 - 32 mmol/L 28  26  23    Calcium 8.9 - 10.3 mg/dL 8.5  8.3  8.2    No results found.   Family Communication: Discussed with patient, she understands and agrees. All questions answereed.  Disposition: Status is: Inpatient Remains inpatient appropriate because: low hemoglobin, need close H/H, tele monitoring. Rehab placement.  Planned Discharge Destination: Skilled nursing facility     Time spent 37 min.  Author: Marcelino Duster, MD 02/20/2023 2:10 PM Secure chat 7am to 7pm For on call review [email or web address].

## 2023-02-20 NOTE — Plan of Care (Signed)
  Problem: Respiratory: Goal: Ability to maintain adequate ventilation will improve Outcome: Progressing   Problem: Activity: Goal: Risk for activity intolerance will decrease Outcome: Progressing   Problem: Nutrition: Goal: Adequate nutrition will be maintained Outcome: Progressing   Problem: Coping: Goal: Level of anxiety will decrease Outcome: Progressing   Problem: Elimination: Goal: Will not experience complications related to bowel motility Outcome: Progressing Goal: Will not experience complications related to urinary retention Outcome: Progressing

## 2023-02-21 DIAGNOSIS — E1165 Type 2 diabetes mellitus with hyperglycemia: Secondary | ICD-10-CM | POA: Diagnosis not present

## 2023-02-21 DIAGNOSIS — S42352A Displaced comminuted fracture of shaft of humerus, left arm, initial encounter for closed fracture: Secondary | ICD-10-CM | POA: Diagnosis not present

## 2023-02-21 DIAGNOSIS — N1832 Chronic kidney disease, stage 3b: Secondary | ICD-10-CM | POA: Diagnosis not present

## 2023-02-21 DIAGNOSIS — F1090 Alcohol use, unspecified, uncomplicated: Secondary | ICD-10-CM | POA: Diagnosis not present

## 2023-02-21 LAB — CBC
HCT: 22.3 % — ABNORMAL LOW (ref 36.0–46.0)
Hemoglobin: 7.3 g/dL — ABNORMAL LOW (ref 12.0–15.0)
MCH: 31.6 pg (ref 26.0–34.0)
MCHC: 32.7 g/dL (ref 30.0–36.0)
MCV: 96.5 fL (ref 80.0–100.0)
Platelets: 167 10*3/uL (ref 150–400)
RBC: 2.31 MIL/uL — ABNORMAL LOW (ref 3.87–5.11)
RDW: 16.8 % — ABNORMAL HIGH (ref 11.5–15.5)
WBC: 7.2 10*3/uL (ref 4.0–10.5)
nRBC: 1.8 % — ABNORMAL HIGH (ref 0.0–0.2)

## 2023-02-21 LAB — GLUCOSE, CAPILLARY
Glucose-Capillary: 120 mg/dL — ABNORMAL HIGH (ref 70–99)
Glucose-Capillary: 225 mg/dL — ABNORMAL HIGH (ref 70–99)
Glucose-Capillary: 224 mg/dL — ABNORMAL HIGH (ref 70–99)
Glucose-Capillary: 199 mg/dL — ABNORMAL HIGH (ref 70–99)

## 2023-02-21 LAB — BASIC METABOLIC PANEL
Anion gap: 8 (ref 5–15)
BUN: 30 mg/dL — ABNORMAL HIGH (ref 8–23)
CO2: 25 mmol/L (ref 22–32)
Calcium: 8.3 mg/dL — ABNORMAL LOW (ref 8.9–10.3)
Chloride: 99 mmol/L (ref 98–111)
Creatinine, Ser: 1.3 mg/dL — ABNORMAL HIGH (ref 0.44–1.00)
GFR, Estimated: 45 mL/min — ABNORMAL LOW (ref >60)
Glucose, Bld: 130 mg/dL — ABNORMAL HIGH (ref 70–99)
Potassium: 4.1 mmol/L (ref 3.5–5.1)
Sodium: 132 mmol/L — ABNORMAL LOW (ref 135–145)

## 2023-02-21 NOTE — Progress Notes (Signed)
Physical Therapy Treatment Patient Details Name: Adriana Miller MRN: [id number] DOB: May 23, 1955 Today's Date: 02/21/2023   History of Present Illness Pt is a 67 yo female presenting  with a comminuted L humeral fracture after a fall injuring her L shoulder. PMH includes hx of falls, elevated ETOH levels, alcohol use disorder, recurrent UTIs, HTN, persistant a-fib, CHF with preserved EF, seizures, DM, cerebral aneurysm repair, and stage 3b CKD.    PT Comments  Pt was pleasant and motivated to participate during the session and put forth good effort throughout. Pt symptoms monitored throughout session, with pt denying and SOB, dizziness, or lightheadedness with any mobility performed today. She is Min A for bed mobility, showing improved initiation compared to prior session, Mod A to stand from lowered bed surface with hemi walker, and CGA for amb. Pt able to take a few steps to sit in the chair once initially up. Then able to perform additional bout of ~15 feet with hemi walker, with CGA and consistent VC's for hemi-walker management and sequencing when stepping and with sharp turns. Provided demonstration and education on proper hemi walker placement and use during transfers and ambulation. Pt will benefit from continued PT services upon discharge to safely address deficits listed in patient problem list for decreased caregiver assistance and eventual return to PLOF.    If plan is discharge home, recommend the following: A lot of help with walking and/or transfers;Assistance with cooking/housework;Direct supervision/assist for medications management;Assist for transportation;Help with stairs or ramp for entrance;A lot of help with bathing/dressing/bathroom   Can travel by private vehicle     Yes  Equipment Recommendations  Other (comment) (TBD at next venue of care)    Recommendations for Other Services       Precautions / Restrictions Precautions Precautions: Fall Required Braces or  Orthoses: Sling Restrictions Weight Bearing Restrictions: Yes LUE Weight Bearing: Non weight bearing     Mobility  Bed Mobility Overal bed mobility: Needs Assistance Bed Mobility: Supine to Sit     Supine to sit: Min assist     General bed mobility comments: Able to initiate easily, only needing Min A via HHA to help adjust self fully upright    Transfers Overall transfer level: Needs assistance Equipment used: Hemi-walker Transfers: Sit to/from Stand Sit to Stand: Mod assist           General transfer comment: Mod A to stand from lowered bed height, VC's for forwards lean    Ambulation/Gait Ambulation/Gait assistance: Contact guard assist Gait Distance (Feet): 5 Feet x1, 15 feet x1 Assistive device: Hemi-walker Gait Pattern/deviations: Decreased step length - left, Decreased step length - right, Decreased stride length, Step-through pattern Gait velocity: decreased     General Gait Details: pt able to perform inital few steps to sit in chair, then able to perform additional bout in room. mod multimodal cues needed for stepping pattern and hemi-walker management/sequencing   Stairs             Wheelchair Mobility     Tilt Bed    Modified Rankin (Stroke Patients Only)       Balance Overall balance assessment: Needs assistance Sitting-balance support: Feet supported, No upper extremity supported Sitting balance-Leahy Scale: Good Sitting balance - Comments: Once seaeted, pt stable, denies and SOB or lightneadedness   Standing balance support: Single extremity supported Standing balance-Leahy Scale: Fair Standing balance comment: Mod A to stand with hemi-walker, once up progressing to CGA  Cognition Arousal: Alert Behavior During Therapy: WFL for tasks assessed/performed Overall Cognitive Status: No family/caregiver present to determine baseline cognitive functioning                                           Exercises Other Exercises Other Exercises: Education provided for hemi-walker placement and use when performing ambulation or transfers    General Comments        Pertinent Vitals/Pain Pain Assessment Pain Assessment: Faces Faces Pain Scale: Hurts little more Pain Location: L shoulder Pain Descriptors / Indicators: Aching, Grimacing, Throbbing, Discomfort Pain Intervention(s): Premedicated before session, Monitored during session, Limited activity within patient's tolerance    Home Living                          Prior Function            PT Goals (current goals can now be found in the care plan section) Progress towards PT goals: Progressing toward goals    Frequency    7X/week      PT Plan      Co-evaluation              AM-PAC PT "6 Clicks" Mobility   Outcome Measure  Help needed turning from your back to your side while in a flat bed without using bedrails?: A Little Help needed moving from lying on your back to sitting on the side of a flat bed without using bedrails?: A Little Help needed moving to and from a bed to a chair (including a wheelchair)?: A Little Help needed standing up from a chair using your arms (e.g., wheelchair or bedside chair)?: A Little Help needed to walk in hospital room?: A Little Help needed climbing 3-5 steps with a railing? : A Little 6 Click Score: 18    End of Session Equipment Utilized During Treatment: Gait belt Activity Tolerance: Patient tolerated treatment well Patient left: in chair;with chair alarm set;with call bell/phone within reach Nurse Communication: Mobility status PT Visit Diagnosis: Unsteadiness on feet (R26.81);Other abnormalities of gait and mobility (R26.89);Repeated falls (R29.6);History of falling (Z91.81);Muscle weakness (generalized) (M62.81);Difficulty in walking, not elsewhere classified (R26.2);Pain Pain - Right/Left: Left Pain - part of body: Shoulder     Time:  8469-6295 PT Time Calculation (min) (ACUTE ONLY): 20 min  Charges:                            Cecile Sheerer, SPT 02/21/23, 1:35 PM

## 2023-02-21 NOTE — Progress Notes (Signed)
Occupational Therapy Treatment Patient Details Name: Adriana Miller MRN: [id number] DOB: July 29, 1955 Today's Date: 02/21/2023   History of present illness Pt is a 67 yo female presenting  with a comminuted L humeral fracture after a fall injuring her L shoulder. PMH includes hx of falls, elevated ETOH levels, alcohol use disorder, recurrent UTIs, HTN, persistant a-fib, CHF with preserved EF, seizures, DM, cerebral aneurysm repair, and stage 3b CKD.   OT comments  Pt seen for OT tx. Pt calling out to unit secretary requesting to return to bed. Pt agreeable to OT session with goal of returning to bed. Pt denied pain, endorsed feeling good about her recovery to date. Pt required MIN A for ADL transfers (STS and step pivot) from recliner to EOB. MIN A for BLE back to bed. VC for hand placement and anterior weight shift prior to standing to improve transfer with pt endorsing it felt better using technique. Pt declines further tx, endorsing feeling comfortable as is in bed. Pt continues to benefit from skilled OT services. Will continue to progress as able.       If plan is discharge home, recommend the following:  A lot of help with bathing/dressing/bathroom;Assistance with cooking/housework;Assist for transportation;Help with stairs or ramp for entrance;A little help with walking and/or transfers   Equipment Recommendations  Other (comment) (defer)    Recommendations for Other Services      Precautions / Restrictions Precautions Precautions: Fall Required Braces or Orthoses: Sling Restrictions Weight Bearing Restrictions: Yes LUE Weight Bearing: Non weight bearing       Mobility Bed Mobility Overal bed mobility: Needs Assistance Bed Mobility: Sit to Supine       Sit to supine: Min assist   General bed mobility comments: MIN A for BLE mgt back to bed    Transfers Overall transfer level: Needs assistance Equipment used: Hemi-walker Transfers: Sit to/from Stand, Bed to  chair/wheelchair/BSC Sit to Stand: Min assist     Step pivot transfers: Min assist           Balance Overall balance assessment: Needs assistance Sitting-balance support: Feet supported, No upper extremity supported Sitting balance-Leahy Scale: Good     Standing balance support: Single extremity supported Standing balance-Leahy Scale: Fair                             ADL either performed or assessed with clinical judgement   ADL                                              Extremity/Trunk Assessment              Vision       Perception     Praxis      Cognition Arousal: Alert Behavior During Therapy: WFL for tasks assessed/performed Overall Cognitive Status: No family/caregiver present to determine baseline cognitive functioning                                          Exercises      Shoulder Instructions       General Comments      Pertinent Vitals/ Pain       Pain Assessment Pain Assessment: No/denies pain  Home  Living                                          Prior Functioning/Environment              Frequency  Min 1X/week        Progress Toward Goals  OT Goals(current goals can now be found in the care plan section)  Progress towards OT goals: Progressing toward goals  Acute Rehab OT Goals Patient Stated Goal: go home and decrease LUE pain OT Goal Formulation: With patient Time For Goal Achievement: 03/02/23 Potential to Achieve Goals: Good  Plan      Co-evaluation                 AM-PAC OT "6 Clicks" Daily Activity     Outcome Measure   Help from another person eating meals?: A Little Help from another person taking care of personal grooming?: A Little Help from another person toileting, which includes using toliet, bedpan, or urinal?: A Lot Help from another person bathing (including washing, rinsing, drying)?: A Lot Help from another person  to put on and taking off regular upper body clothing?: A Lot Help from another person to put on and taking off regular lower body clothing?: A Lot 6 Click Score: 14    End of Session Equipment Utilized During Treatment: Other (comment) (hemiwalker)  OT Visit Diagnosis: Unsteadiness on feet (R26.81);Repeated falls (R29.6);Muscle weakness (generalized) (M62.81)   Activity Tolerance Patient tolerated treatment well   Patient Left in bed;with call bell/phone within reach;with bed alarm set   Nurse Communication          Time: 1610-9604 OT Time Calculation (min): 8 min  Charges: OT General Charges $OT Visit: 1 Visit OT Treatments $Self Care/Home Management : 8-22 mins  Arman Filter., MPH, MS, OTR/L ascom [phone] 02/21/23, 1:55 PM

## 2023-02-21 NOTE — Consult Note (Signed)
   [facility] CM Inpatient Consult   02/21/2023  Adriana Miller 1955/08/24 [id number]  Primary Care Provider:  Lynn Ito, PA-C [facility])  Patient is currently active with Care Management for chronic disease management services.  Patient has been engaged by a RNCM  Our community based plan of care has focused on disease management and community resource support.   Patient will receive a post hospital call and will be evaluated for assessments and disease process education.   Plan: Pending disposition with possible SNF  Inpatient Transition Of Care [TOC] team member to make aware that Care Management following.  Of note, Care Management services does not replace or interfere with any services that are needed or arranged by inpatient [facility] care management team.   For additional questions or referrals please contact:  Elliot Cousin, RN, [facility] Liaison [place]   [facility], Population Health Office Hours MTWF  8:00 am-6:00 pm Direct Dial: [phone] mobile [phone] [Office toll free line] Office Hours are M-F 8:30 - 5 pm [email or web address]

## 2023-02-21 NOTE — Progress Notes (Signed)
Progress Note   Patient: Adriana Miller BJY:[id number] DOB: [birth year] DOA: 02/14/2023     7 DOS: the patient was seen and examined on 02/21/2023   Brief hospital course: 67 y.o. female with medical history significant for Persistent PAF/flutter on apixaban s/p failed DCCV 12/27/2022, DM2, CKD stage III-IV, cirrhosis  and hepatocellular carcinoma, seizure disorder, cerebral aneurysm status post repair, HTN, and HLD , as well as daily  frailty and frequent falls.  She is found to have left proximal humerus fracture   10/29: Orthopedic seen.  Recommends conservative management. 10/30: Rapid response called as she lost consciousness briefly while working with PT.  10/31: feels better, advised to work with PT for safe disposition plan. 11/1: Hb drop noted, no active bleeding though on Eliquis. 1 Unit PRBC ordered. 11/2: Hb not much improved, 7 today after blood transfusion. She denies any active bleeding, did not get out of bed. 11/3: Hb 8, feels much improved, worked with PT recommended SNF placement. 11/4: Awaiting SNF placement.  Assessment and Plan: * Comminuted left humeral fracture, closed, initial encounter Left arm in sling. Has large bruise. Hb stable. Pain control with tylenol. Advised to limit opioids. Ortho team advised conservative management PT advised SNF placement.  TOC to work on safe disposition plan.  Accidental fall versus fall related to alcohol intoxication Syncopal episode in hospital. Possibly from dehydration, acute anemia. Orthostatic vitals improving. Continue telemetry. Encourage oral hydration.  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 Continue to hold Lasix, aldactone.  Acute on Chronic Anemia: Hb 7.3 today. got 1 unit PRBC 11/1. Iron level low. Continue 3 days of IV iron. Check stool occult. Hold Eliquis for drop in H/H, large bruise at trauma site. Continue to monitor H/H. Transfuse for Hb less than 7.  Elevated ETOH  level Alcohol use disorder with possible acute intoxication Alcoholic cirrhosis without ascites EtOH level over 220 on arrival. Alcohol cessation counseled No withdrawal symptoms. Thiamine, folate, multivitamin. Advised to limit opiates.   History of recurrent UTI (urinary tract infection) Sepsis secondary to E. coli in August 2024 UA concerning for UTI.  finished antibiotics total 7 days.   Persistent atrial fibrillation (HCC) Chronic anticoagulation History of unsuccessful DCCV in September 2024 Continue metoprolol. Held apixaban due to anemia. Last seen by cardiology 10/7   Essential hypertension HFpEF Clinically compensated Lasix, spironolactone held, continue metoprolol.    Uncontrolled type 2 diabetes mellitus with hyperglycemia, with long-term current use of insulin (HCC) Continue Semglee 10 with sliding scale coverage Diabetes education provided. Weight reduction advised.   History of cerebral aneurysm repair No acute issues suspected.  Head CT was nonacute.  Has constipation on senna, colace, laxative, will give soap sud enema. Out of bed to chair. Incentive spirometry. Nursing supportive care. Fall, aspiration precautions. DVT prophylaxis   Code Status: Do not attempt resuscitation (DNR) PRE-ARREST INTERVENTIONS DESIRED  Subjective: Patient is seen and examined today morning. She feels better today, no dizziness. Advised out of bed, work with PT.  Physical Exam: Vitals:   02/20/23 2139 02/21/23 0737 02/21/23 0807 02/21/23 1533  BP: 121/69 (!) 110/59 112/60 119/75  Pulse: (!) 106 95 94 100  Resp: 18 16 17 16   Temp: 98.6 F (37 C) 99.1 F (37.3 C) 98.8 F (37.1 C) 98.9 F (37.2 C)  TempSrc: Oral  Oral   SpO2: 100% 99% 99% 100%  Weight:      Height:       General - Elderly Caucasian female, no  apparent distress HEENT - PERRLA, EOMI, atraumatic head, non tender sinuses. Lung - Clear, basal rales, no rhonchi, wheezes. Heart - S1, S2 heard, tachycardia,  trace pedal edema. Abdomen - Soft, non tender, obese, bowel sounds good Neuro - Alert, awake and oriented x 3, non focal exam. Skin - Warm and dry. Left shoulder, arm bruise noted. Sling intact.  Data Reviewed:      Latest Ref Rng & Units 02/21/2023    3:53 AM 02/20/2023    7:40 AM 02/19/2023    3:29 AM  CBC  WBC 4.0 - 10.5 K/uL 7.2  8.2  5.6   Hemoglobin 12.0 - 15.0 g/dL 7.3  8.0  7.0   Hematocrit 36.0 - 46.0 % 22.3  23.8  21.1   Platelets 150 - 400 K/uL 167  160  122       Latest Ref Rng & Units 02/21/2023    3:53 AM 02/20/2023    7:40 AM 02/19/2023    3:29 AM  BMP  Glucose 70 - 99 mg/dL 161  096  045   BUN 8 - 23 mg/dL 30  34  46   Creatinine 0.44 - 1.00 mg/dL 4.09  8.11  9.14   Sodium 135 - 145 mmol/L 132  134  131   Potassium 3.5 - 5.1 mmol/L 4.1  4.3  4.4   Chloride 98 - 111 mmol/L 99  100  98   CO2 22 - 32 mmol/L 25  28  26    Calcium 8.9 - 10.3 mg/dL 8.3  8.5  8.3    No results found.   Family Communication: Discussed with patient, she understands and agrees. All questions answereed.  Disposition: Status is: Inpatient Remains inpatient appropriate because: low hemoglobin, need close H/H, tele monitoring. Rehab placement.  Planned Discharge Destination: Skilled nursing facility     Time spent 36 min.  Author: Marcelino Duster, MD 02/21/2023 4:29 PM Secure chat 7am to 7pm For on call review [email or web address].

## 2023-02-21 NOTE — Plan of Care (Signed)
  Problem: Clinical Measurements: Goal: Diagnostic test results will improve Outcome: Progressing   

## 2023-02-21 NOTE — Plan of Care (Signed)
  Problem: Fluid Volume: Goal: Hemodynamic stability will improve Outcome: Progressing   Problem: Clinical Measurements: Goal: Diagnostic test results will improve Outcome: Progressing   Problem: Clinical Measurements: Goal: Diagnostic test results will improve Outcome: Progressing Goal: Respiratory complications will improve Outcome: Progressing Goal: Cardiovascular complication will be avoided Outcome: Progressing

## 2023-02-22 ENCOUNTER — Inpatient Hospital Stay
Admission: RE | Admit: 2023-02-22 | Discharge: 2023-02-22 | Disposition: A | Discharge status: Home / Self Care | Payer: 59 | Source: Ambulatory Visit | Attending: Interventional Radiology | Admitting: Interventional Radiology

## 2023-02-22 DIAGNOSIS — F1090 Alcohol use, unspecified, uncomplicated: Secondary | ICD-10-CM | POA: Diagnosis not present

## 2023-02-22 DIAGNOSIS — N1832 Chronic kidney disease, stage 3b: Secondary | ICD-10-CM | POA: Diagnosis not present

## 2023-02-22 DIAGNOSIS — C220 Liver cell carcinoma: Secondary | ICD-10-CM | POA: Diagnosis not present

## 2023-02-22 DIAGNOSIS — S42352A Displaced comminuted fracture of shaft of humerus, left arm, initial encounter for closed fracture: Secondary | ICD-10-CM | POA: Diagnosis not present

## 2023-02-22 DIAGNOSIS — W19XXXA Unspecified fall, initial encounter: Secondary | ICD-10-CM | POA: Diagnosis not present

## 2023-02-22 HISTORY — PX: IR RADIOLOGIST EVAL & MGMT: IMG5224

## 2023-02-22 LAB — BASIC METABOLIC PANEL
Anion gap: 7 (ref 5–15)
BUN: 26 mg/dL — ABNORMAL HIGH (ref 8–23)
CO2: 25 mmol/L (ref 22–32)
Calcium: 8.6 mg/dL — ABNORMAL LOW (ref 8.9–10.3)
Chloride: 100 mmol/L (ref 98–111)
Creatinine, Ser: 1.27 mg/dL — ABNORMAL HIGH (ref 0.44–1.00)
GFR, Estimated: 46 mL/min — ABNORMAL LOW (ref >60)
Glucose, Bld: 168 mg/dL — ABNORMAL HIGH (ref 70–99)
Potassium: 3.9 mmol/L (ref 3.5–5.1)
Sodium: 132 mmol/L — ABNORMAL LOW (ref 135–145)

## 2023-02-22 LAB — GLUCOSE, CAPILLARY
Glucose-Capillary: 124 mg/dL — ABNORMAL HIGH (ref 70–99)
Glucose-Capillary: 225 mg/dL — ABNORMAL HIGH (ref 70–99)
Glucose-Capillary: 195 mg/dL — ABNORMAL HIGH (ref 70–99)
Glucose-Capillary: 187 mg/dL — ABNORMAL HIGH (ref 70–99)

## 2023-02-22 LAB — CBC
HCT: 26.7 % — ABNORMAL LOW (ref 36.0–46.0)
Hemoglobin: 8.6 g/dL — ABNORMAL LOW (ref 12.0–15.0)
MCH: 33 pg (ref 26.0–34.0)
MCHC: 32.2 g/dL (ref 30.0–36.0)
MCV: 102.3 fL — ABNORMAL HIGH (ref 80.0–100.0)
Platelets: 172 10*3/uL (ref 150–400)
RBC: 2.61 MIL/uL — ABNORMAL LOW (ref 3.87–5.11)
RDW: 18.8 % — ABNORMAL HIGH (ref 11.5–15.5)
WBC: 5 10*3/uL (ref 4.0–10.5)
nRBC: 1 % — ABNORMAL HIGH (ref 0.0–0.2)

## 2023-02-22 MED ORDER — FUROSEMIDE 20 MG PO TABS
20.0000 mg | ORAL_TABLET | ORAL | Status: DC
  Administered 2023-02-22 – 2023-02-26 (×3): 20 mg via ORAL
  Filled 2023-02-22 (×3): qty 1

## 2023-02-22 MED ORDER — SPIRONOLACTONE 25 MG PO TABS
25.0000 mg | ORAL_TABLET | Freq: Every day | ORAL | Status: DC
  Administered 2023-02-22 – 2023-02-28 (×7): 25 mg via ORAL
  Filled 2023-02-22 (×7): qty 1

## 2023-02-22 MED ORDER — HYDROCORTISONE (PERIANAL) 2.5 % EX CREA
TOPICAL_CREAM | Freq: Two times a day (BID) | CUTANEOUS | Status: DC
  Filled 2023-02-22: qty 28.35

## 2023-02-22 NOTE — Progress Notes (Signed)
RE: Adriana Miller Date of Birth: 02/03/1956 Date: 02/22/2023     To Whom It May Concern:   Please be advised that the above-named patient will require a short-term nursing home stay - anticipated 30 days or less for rehabilitation and strengthening.  The plan is for return home

## 2023-02-22 NOTE — [place] FL2 (Signed)
[place] MEDICAID FL2 LEVEL OF CARE FORM     IDENTIFICATION  Patient Name: Adriana Miller Birthdate: [birth year] Sex: female Admission Date (Current Location): 02/14/2023  [place] and [place] Number:  [address] [id number] Q Facility and Address:  [facility], [address] [place], [place] [address]      Provider Number: [id number]  Attending Physician Name and Address:  Marcelino Duster, MD  Relative Name and Phone Number:  Jaquita Rector [phone]    Current Level of Care: Hospital Recommended Level of Care: Skilled Nursing Facility Prior Approval Number:    Date Approved/Denied:   PASRR Number: Pending  Discharge Plan: SNF    Current Diagnoses: Patient Active Problem List   Diagnosis Date Noted   Acute on chronic anemia 02/18/2023   Comminuted left humeral fracture, closed, initial encounter 02/14/2023   Accidental fall 02/14/2023   Elevated ETOH level 02/14/2023   Uncontrolled type 2 diabetes mellitus with hyperglycemia, with long-term current use of insulin (HCC) 02/14/2023   History of recurrent UTI (urinary tract infection) 02/14/2023   Alcohol use disorder 02/14/2023   Chronic anticoagulation 02/14/2023   Atrial flutter (HCC) 12/27/2022   Chronic atrial fibrillation with RVR (HCC) 11/18/2022   SIRS (systemic inflammatory response syndrome) (HCC) 11/18/2022   Stage 3b chronic kidney disease (HCC) 11/18/2022   Frailty 11/18/2022   Sepsis (HCC) 11/18/2022   Hypoglycemia 10/02/2022   Hyponatremia 10/01/2022   Hypokalemia 10/01/2022   Chronic kidney disease, stage 3a (HCC) 10/01/2022   Urinary tract infection 09/30/2022   Type II diabetes mellitus with renal manifestations (HCC) 09/30/2022   Depression 09/30/2022   Diarrhea 09/30/2022   Hypomagnesemia 09/30/2022   Severe sepsis (HCC) 08/22/2022   AKI (acute kidney injury) (HCC) 08/21/2022   Elevated LFTs 08/21/2022   Thrombocytopenia (HCC) 08/21/2022   History of  cerebral aneurysm repair 08/21/2022   Metabolic encephalopathy 08/21/2022   Elevated troponin 08/21/2022   Dehydration 06/18/2022   SIRS due to infectious process with acute organ dysfunction (HCC) 06/17/2022   Bacterial conjunctivitis 06/17/2022   Lactic acidosis 06/17/2022   Paroxysmal atrial fibrillation (HCC) 08/06/2021   Shaking 08/06/2021   Hepatic cirrhosis (HCC) 04/30/2020   Hepatocellular carcinoma (HCC) 01/08/2020   Left arm weakness 07/25/2019   Atrial fibrillation with RVR (HCC) 07/25/2019   Hypotension 04/20/2019   Alcoholic cirrhosis of liver without ascites (HCC)    Chronic heart failure with preserved ejection fraction (HFpEF) (HCC) 01/15/2019   Dyspnea on exertion 10/10/2018   Persistent atrial fibrillation (HCC) 10/10/2018   Diastolic dysfunction 10/10/2018   Acute upper respiratory infection 09/07/2017   Essential hypertension 09/07/2017   Type 2 diabetes mellitus (HCC) 04/05/2017   Weakness 04/05/2017   Mixed hyperlipidemia 04/05/2017   Alcohol abuse with other alcohol-induced disorder (HCC) 04/05/2017   B12 deficiency 04/05/2017   History of seizure 05/20/2015   Brain aneurysm 05/20/2015    Orientation RESPIRATION BLADDER Height & Weight     Self, Time, Situation, Place  Normal Incontinent Weight: 81.6 kg Height:  5\' 6"  (167.6 cm)  BEHAVIORAL SYMPTOMS/MOOD NEUROLOGICAL BOWEL NUTRITION STATUS    Convulsions/Seizures Continent  (See Discharge Summary)  AMBULATORY STATUS COMMUNICATION OF NEEDS Skin   Extensive Assist Verbally Normal                       Personal Care Assistance Level of Assistance  Bathing, Feeding, Dressing Bathing Assistance: Maximum assistance Feeding assistance: Limited assistance Dressing Assistance: Maximum assistance     Functional Limitations Info  Sight,  Hearing, Speech Sight Info: Adequate Hearing Info: Adequate Speech Info: Adequate    SPECIAL CARE FACTORS FREQUENCY  PT (By licensed PT), OT (By licensed OT)      PT Frequency: 5x weekly OT Frequency: 5x weekly            Contractures Contractures Info: Not present    Additional Factors Info  Code Status, Allergies Code Status Info: DNR- Interven Allergies Info: Aspirin           Current Medications (02/22/2023):  This is the current hospital active medication list Current Facility-Administered Medications  Medication Dose Route Frequency Provider Last Rate Last Admin   acetaminophen (TYLENOL) tablet 650 mg  650 mg Oral Q6H PRN Andris Baumann, MD   650 mg at 02/19/23 8657   Or   acetaminophen (TYLENOL) suppository 650 mg  650 mg Rectal Q6H PRN Andris Baumann, MD       albuterol (PROVENTIL) (2.5 MG/3ML) 0.083% nebulizer solution 2.5 mg  2.5 mg Nebulization Q2H PRN Andris Baumann, MD       allopurinol (ZYLOPRIM) tablet 100 mg  100 mg Oral Daily Lindajo Royal V, MD   100 mg at 02/22/23 8469   ascorbic acid (VITAMIN C) tablet 500 mg  500 mg Oral Daily Marcelino Duster, MD   500 mg at 02/22/23 0934   docusate sodium (COLACE) capsule 100 mg  100 mg Oral BID Marcelino Duster, MD   100 mg at 02/22/23 0934   escitalopram (LEXAPRO) tablet 10 mg  10 mg Oral QHS Lindajo Royal V, MD   10 mg at 02/21/23 2322   ferrous sulfate tablet 325 mg  325 mg Oral BID WC Marcelino Duster, MD   325 mg at 02/22/23 0934   folic acid (FOLVITE) tablet 1 mg  1 mg Oral Daily Andris Baumann, MD   1 mg at 02/22/23 0935   HYDROcodone-acetaminophen (NORCO/VICODIN) 5-325 MG per tablet 1-2 tablet  1-2 tablet Oral Q4H PRN Andris Baumann, MD   1 tablet at 02/21/23 2015   hydrocortisone (ANUSOL-HC) 2.5 % rectal cream   Rectal BID Marcelino Duster, MD   Given at 02/22/23 1442   insulin aspart (novoLOG) injection 0-5 Units  0-5 Units Subcutaneous QHS Delfino Lovett, MD   2 Units at 02/20/23 2147   insulin aspart (novoLOG) injection 0-9 Units  0-9 Units Subcutaneous TID WC Delfino Lovett, MD   3 Units at 02/22/23 1216   insulin glargine-yfgn (SEMGLEE) injection 10  Units  10 Units Subcutaneous Q supper Andris Baumann, MD   10 Units at 02/21/23 2321   metoprolol tartrate (LOPRESSOR) tablet 12.5 mg  12.5 mg Oral BID Marcelino Duster, MD   12.5 mg at 02/22/23 0934   mirtazapine (REMERON) tablet 7.5 mg  7.5 mg Oral QHS Lindajo Royal V, MD   7.5 mg at 02/21/23 2322   morphine (PF) 2 MG/ML injection 2 mg  2 mg Intravenous Q2H PRN Andris Baumann, MD   2 mg at 02/19/23 1842   multivitamin with minerals tablet 1 tablet  1 tablet Oral Daily Andris Baumann, MD   1 tablet at 02/22/23 0935   ondansetron (ZOFRAN) tablet 4 mg  4 mg Oral Q6H PRN Andris Baumann, MD       Or   ondansetron [facility]) injection 4 mg  4 mg Intravenous Q6H PRN Andris Baumann, MD       polyethylene glycol (MIRALAX / GLYCOLAX) packet 17 g  17 g Oral Daily Sreeram,  Narendranath, MD   17 g at 02/22/23 0933   senna (SENOKOT) tablet 17.2 mg  2 tablet Oral QHS Marcelino Duster, MD   17.2 mg at 02/21/23 2322   thiamine (VITAMIN B1) tablet 100 mg  100 mg Oral Daily Lindajo Royal V, MD   100 mg at 02/22/23 1610   Or   thiamine (VITAMIN B1) injection 100 mg  100 mg Intravenous Daily Lindajo Royal V, MD   100 mg at 02/16/23 0931   traZODone (DESYREL) tablet 50-100 mg  50-100 mg Oral QHS PRN Andris Baumann, MD         Discharge Medications: Please see discharge summary for a list of discharge medications.  Relevant Imaging Results:  Relevant Lab Results:   Additional Information [id number]  Garret Reddish, RN

## 2023-02-22 NOTE — Progress Notes (Signed)
Progress Note   Patient: Adriana Miller UJW:[id number] DOB: 09-Mar-1956 DOA: 02/14/2023     8 DOS: the patient was seen and examined on 02/22/2023   Brief hospital course: 67 y.o. female with medical history significant for Persistent PAF/flutter on apixaban s/p failed DCCV 12/27/2022, DM2, CKD stage III-IV, cirrhosis  and hepatocellular carcinoma, seizure disorder, cerebral aneurysm status post repair, HTN, and HLD , as well as daily  frailty and frequent falls.  She is found to have left proximal humerus fracture   10/29: Orthopedic seen.  Recommends conservative management. 10/30: Rapid response called as she lost consciousness briefly while working with PT.  10/31: feels better, advised to work with PT for safe disposition plan. 11/1: Hb drop noted, no active bleeding though on Eliquis. 1 Unit PRBC ordered. 11/2: Hb not much improved, 7 today after blood transfusion. Eliquis held. She denies any active bleeding, did not get out of bed. 11/3: Hb 8, feels much improved, worked with PT recommended SNF placement. 11/4- 11/5: Awaiting SNF placement. FL2 signed.  Assessment and Plan: * Comminuted left humeral fracture, closed, initial encounter Left arm in sling. Has large bruise. Hb stable. Pain control with tylenol. Advised to limit opioids. Ortho team advised conservative management, outpatient follow up. PT advised SNF placement.  TOC to work on safe disposition plan.  Accidental fall versus fall related to alcohol intoxication Syncopal episode in hospital. Possibly from dehydration, acute anemia. Orthostatic vitals improving. Continue telemetry. Encourage oral hydration.  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 Resumed home dose Lasix, aldactone.  Acute on Chronic Anemia: Hb 7.3 today. got 1 unit PRBC 11/1. Iron level low. got 3 days of IV iron. Check stool occult. Hold Eliquis for drop in H/H, large bruise at trauma site.  Continue to monitor  H/H. Transfuse for Hb less than 7. If Hb stable above 8 tomorrow, resume Eliquis.  Elevated ETOH level Alcohol use disorder with possible acute intoxication Alcoholic cirrhosis without ascites Alcohol cessation counseled No withdrawal symptoms. Thiamine, folate, multivitamin. Advised to limit opiates.   History of recurrent UTI (urinary tract infection) Sepsis secondary to E. coli in August 2024 UA concerning for UTI.  finished antibiotics total 7 days.   Persistent atrial fibrillation (HCC) Chronic anticoagulation History of unsuccessful DCCV in September 2024 Continue metoprolol.  If HB stable around 8, resume Eliquis. Last seen by cardiology 10/7   Essential hypertension HFpEF Clinically compensated Lasix, spironolactone held, continue metoprolol.    Uncontrolled type 2 diabetes mellitus with hyperglycemia, with long-term current use of insulin (HCC) Continue Semglee 10 with sliding scale coverage Diabetes education provided. Weight reduction advised.   History of cerebral aneurysm repair No acute issues suspected.  Head CT was nonacute.  Has constipation on senna, colace, laxative, will give soap sud enema. Out of bed to chair. Incentive spirometry. Nursing supportive care. Fall, aspiration precautions. DVT prophylaxis   Code Status: Do not attempt resuscitation (DNR) PRE-ARREST INTERVENTIONS DESIRED  Subjective: Patient is seen and examined today morning. She feels better. Agrees to go SNF. Advised incentive spirometry.  Physical Exam: Vitals:   02/21/23 0807 02/21/23 1533 02/21/23 2311 02/22/23 0824  BP: 112/60 119/75 117/70 106/75  Pulse: 94 100 87 91  Resp: 17 16 18    Temp: 98.8 F (37.1 C) 98.9 F (37.2 C) 97.9 F (36.6 C) 98.1 F (36.7 C)  TempSrc: Oral  Oral   SpO2: 99% 100% 99% 95%  Weight:      Height:  General - Elderly Caucasian female, no apparent distress HEENT - PERRLA, EOMI, atraumatic head, non tender sinuses. Lung - Clear, basal  rales, no rhonchi, wheezes. Heart - S1, S2 heard, tachycardia, trace pedal edema. Abdomen - Soft, non tender, obese, bowel sounds good Neuro - Alert, awake and oriented x 3, non focal exam. Skin - Warm and dry. Left shoulder, arm bruise noted. Sling intact.  Data Reviewed:      Latest Ref Rng & Units 02/22/2023    4:48 AM 02/21/2023    3:53 AM 02/20/2023    7:40 AM  CBC  WBC 4.0 - 10.5 K/uL 5.0  7.2  8.2   Hemoglobin 12.0 - 15.0 g/dL 8.6  7.3  8.0   Hematocrit 36.0 - 46.0 % 26.7  22.3  23.8   Platelets 150 - 400 K/uL 172  167  160       Latest Ref Rng & Units 02/22/2023    4:48 AM 02/21/2023    3:53 AM 02/20/2023    7:40 AM  BMP  Glucose 70 - 99 mg/dL 409  811  914   BUN 8 - 23 mg/dL 26  30  34   Creatinine 0.44 - 1.00 mg/dL 7.82  9.56  2.13   Sodium 135 - 145 mmol/L 132  132  134   Potassium 3.5 - 5.1 mmol/L 3.9  4.1  4.3   Chloride 98 - 111 mmol/L 100  99  100   CO2 22 - 32 mmol/L 25  25  28    Calcium 8.9 - 10.3 mg/dL 8.6  8.3  8.5    IR Radiologist Eval & Mgmt  Result Date: 02/22/2023 EXAM: ESTABLISHED PATIENT OFFICE VISIT CHIEF COMPLAINT: SEE NOTE IN EPIC HISTORY OF PRESENT ILLNESS: SEE NOTE IN EPIC REVIEW OF SYSTEMS: SEE NOTE IN EPIC PHYSICAL EXAMINATION: SEE NOTE IN EPIC ASSESSMENT AND PLAN: SEE NOTE IN EPIC Electronically Signed   By: Malachy Moan M.D.   On: 02/22/2023 15:12     Family Communication: Discussed with patient, she understands and agrees. All questions answereed.  Disposition: Status is: Inpatient Remains inpatient appropriate because: low hemoglobin, need close H/H, tele monitoring. Rehab placement.  Planned Discharge Destination: Skilled nursing facility     Time spent 39 min.  Author: Marcelino Duster, MD 02/22/2023 4:38 PM Secure chat 7am to 7pm For on call review [email or web address].

## 2023-02-22 NOTE — Plan of Care (Signed)
  Problem: Fluid Volume: Goal: Hemodynamic stability will improve Outcome: Progressing   Problem: Clinical Measurements: Goal: Diagnostic test results will improve Outcome: Progressing Goal: Signs and symptoms of infection will decrease Outcome: Progressing   Problem: Respiratory: Goal: Ability to maintain adequate ventilation will improve Outcome: Progressing   Problem: Education: Goal: Knowledge of General Education information will improve Description: Including pain rating scale, medication(s)/side effects and non-pharmacologic comfort measures Outcome: Progressing   Problem: Health Behavior/Discharge Planning: Goal: Ability to manage health-related needs will improve Outcome: Progressing   Problem: Clinical Measurements: Goal: Ability to maintain clinical measurements within normal limits will improve Outcome: Progressing Goal: Will remain free from infection Outcome: Progressing Goal: Diagnostic test results will improve Outcome: Progressing Goal: Respiratory complications will improve Outcome: Progressing Goal: Cardiovascular complication will be avoided Outcome: Progressing   Problem: Activity: Goal: Risk for activity intolerance will decrease Outcome: Progressing   Problem: Nutrition: Goal: Adequate nutrition will be maintained Outcome: Progressing   Problem: Coping: Goal: Level of anxiety will decrease Outcome: Progressing   Problem: Elimination: Goal: Will not experience complications related to bowel motility Outcome: Progressing Goal: Will not experience complications related to urinary retention Outcome: Progressing   Problem: Pain Management: Goal: General experience of comfort will improve Outcome: Progressing   Problem: Safety: Goal: Ability to remain free from injury will improve Outcome: Progressing   Problem: Skin Integrity: Goal: Risk for impaired skin integrity will decrease Outcome: Progressing   Problem: Education: Goal:  Ability to describe self-care measures that may prevent or decrease complications (Diabetes Survival Skills Education) will improve Outcome: Progressing Goal: Individualized Educational Video(s) Outcome: Progressing   Problem: Coping: Goal: Ability to adjust to condition or change in health will improve Outcome: Progressing   Problem: Fluid Volume: Goal: Ability to maintain a balanced intake and output will improve Outcome: Progressing   Problem: Health Behavior/Discharge Planning: Goal: Ability to identify and utilize available resources and services will improve Outcome: Progressing Goal: Ability to manage health-related needs will improve Outcome: Progressing   Problem: Metabolic: Goal: Ability to maintain appropriate glucose levels will improve Outcome: Progressing   Problem: Nutritional: Goal: Maintenance of adequate nutrition will improve Outcome: Progressing Goal: Progress toward achieving an optimal weight will improve Outcome: Progressing   Problem: Skin Integrity: Goal: Risk for impaired skin integrity will decrease Outcome: Progressing   Problem: Tissue Perfusion: Goal: Adequacy of tissue perfusion will improve Outcome: Progressing

## 2023-02-22 NOTE — Progress Notes (Signed)
Chief Complaint: Patient was consulted remotely today (TeleHealth) for EtOH cirrhosis complicated by hepatocellular cancer at the request of [name]    Referring Physician(s): Dr. Rickard Patience   History of Present Illness: Adriana Miller is a 67 y.o. 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  She presented with a 2.0 cm segment II and 1.7 cm segment III mass and underwent split dose radiation segmentectomies on 10/02/19.     Her recovery was uneventful.  Initial post therapy liver MRI dated 01/03/2020 demonstrated an excellent response to therapy with no evidence of residual disease in the segment 2 lesion and equivocal residual enhancement in the segment 3 lesion.   MRI dated 04/14/2020 demonstrates continued treatment response to lesions in both the segment 2 and segment 3 with no evidence of residual viable tumor.  No new hepatic lesions are identified.   MRI 07/02/20:  Treated hepatic segment II lesion  LR TR nonviable. Treated hepatic segment III lesion LR TR nonviable.  No new suspicious hepatic lesions.   MRI 10/25/20:  Treated lesions in segments 2 and 3 of the liver again demonstrate no evidence of recurrence/viability. No new hepatic lesions are otherwise noted.     MRI 02/06/21:  Redemonstrated, well-circumscribed, nonenhancing lesion of hepatic segment II, consistent with treated, nonviable hepatocellular carcinoma following embolization and radiotherapy.  Additional lesion of hepatic segment III previously reported is not appreciated on this examination.   MRI 06/05/21:  1. Unchanged, circumscribed, nonenhancing lesion of hepatic segment II, consistent with treated, nonviable hepatocellular carcinoma (LI-RADS TR nonviable). 2. As on prior examination, a previously seen lesion of hepatic segment III is not well appreciated and without suspicious contrast enhancement in this vicinity.   MRI 11/16/21:  1.  Unchanged small nonenhancing lesion of posterior hepatic segment II measuring 0.9 x 0.6 cm, consistent with treated hepatocellular carcinoma, without evidence of residual viable tissue. LI-RADS category TR, nonviable. 2. Focus of arterial hyperenhancement in the anterior right lobe of the liver, measuring 1.1 x 0.8 cm. No evidence of washout or capsular enhancement. LI-RADS category 3, intermediate suspicion for hepatocellular carcinoma. Recommend attention on follow-up at 6 months.   MRI 05/17/22: 1. Interval increase in size of the 11 x 10 mm arterially enhancing lesion in the anterior right lobe of the liver but less than that of threshold growth and without washout or capsule. There is associated reduced diffusivity corresponding with this lesion, which would be indicative of a LI-RADS category 4.    I spoke to Adriana Miller over the phone today.  Unfortunately, she is currently admitted to the hospital with an acute left proximal humerus fracture after falling.  She has been in the hospital since last week and was therefore unable to get her surveillance MRI for continued follow-up of her previously treated hepatocellular carcinoma as well as of the 11 mm lesion in the anterior aspect of the hepatic dome.  She is recovering well and hopes to be discharged home soon.  I reassured her that we will pursue her MRI when she is more healed and able to tolerate it.  It is unlikely that she could tolerate the MRI currently with her healing fracture.  We will try again in approximately 3 months time.  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)    B12 deficiency 04/05/2017   Blood in stool    Cerebral aneurysm    Chronic kidney disease  Diabetes mellitus without complication (HCC)    type 2   Gastric erosion with bleeding    Hepatocellular carcinoma (HCC)    Hyperlipidemia    Hypertension    Lower extremity cellulitis 02/08/2019   Paroxysmal A-fib  (HCC)    Seizure disorder [facility])     Past Surgical History:  Procedure Laterality Date   CARDIOVERSION N/A 12/27/2022   Procedure: CARDIOVERSION;  Surgeon: Debbe Odea, MD;  Location: [facility] ORS;  Service: Cardiovascular;  Laterality: N/A;   CEREBRAL ANEURYSM REPAIR  1991   COLONOSCOPY WITH PROPOFOL N/A 02/12/2019   Procedure: COLONOSCOPY WITH PROPOFOL;  Surgeon: Toney Reil, MD;  Location: [facility] ENDOSCOPY;  Service: Gastroenterology;  Laterality: N/A;   ESOPHAGOGASTRODUODENOSCOPY (EGD) WITH PROPOFOL N/A 02/12/2019   Procedure: ESOPHAGOGASTRODUODENOSCOPY (EGD) WITH PROPOFOL;  Surgeon: Toney Reil, MD;  Location: [facility] ENDOSCOPY;  Service: Gastroenterology;  Laterality: N/A;   IR ANGIOGRAM SELECTIVE EACH ADDITIONAL VESSEL  09/21/2019   IR ANGIOGRAM SELECTIVE EACH ADDITIONAL VESSEL  09/21/2019   IR ANGIOGRAM SELECTIVE EACH ADDITIONAL VESSEL  09/21/2019   IR ANGIOGRAM SELECTIVE EACH ADDITIONAL VESSEL  09/21/2019   IR ANGIOGRAM SELECTIVE EACH ADDITIONAL VESSEL  10/02/2019   IR ANGIOGRAM VISCERAL SELECTIVE  09/21/2019   IR ANGIOGRAM VISCERAL SELECTIVE  09/21/2019   IR ANGIOGRAM VISCERAL SELECTIVE  10/02/2019   IR EMBO TUMOR ORGAN ISCHEMIA INFARCT INC GUIDE ROADMAPPING  10/02/2019   IR EMBO TUMOR ORGAN ISCHEMIA INFARCT INC GUIDE ROADMAPPING  10/02/2019   IR RADIOLOGIST EVAL & MGMT  08/28/2019   IR RADIOLOGIST EVAL & MGMT  01/30/2020   IR RADIOLOGIST EVAL & MGMT  04/30/2020   IR RADIOLOGIST EVAL & MGMT  07/30/2020   IR RADIOLOGIST EVAL & MGMT  10/28/2020   IR RADIOLOGIST EVAL & MGMT  02/24/2021   IR RADIOLOGIST EVAL & MGMT  06/08/2021   IR RADIOLOGIST EVAL & MGMT  11/24/2021   IR RADIOLOGIST EVAL & MGMT  05/25/2022   IR RADIOLOGIST EVAL & MGMT  02/22/2023   IR US GUIDE VASC ACCESS LEFT  09/21/2019   IR US GUIDE VASC ACCESS LEFT  10/02/2019   OPEN REDUCTION INTERNAL FIXATION (ORIF) DISTAL RADIAL FRACTURE Right 08/06/2021   Procedure: OPEN REDUCTION INTERNAL FIXATION (ORIF) DISTAL RADIAL FRACTURE;   Surgeon: Kennedy Bucker, MD;  Location: [facility] ORS;  Service: Orthopedics;  Laterality: Right;    Allergies: Aspirin  Medications: Prior to Admission medications   Medication Sig Start Date End Date Taking? Authorizing Provider  acetaminophen (TYLENOL) 325 MG tablet Take 2 tablets (650 mg total) by mouth every 6 (six) hours as needed for mild pain (or Fever >/= 101). 06/19/22   Loyce Dys, MD  Alcohol Swabs (B-D SINGLE USE SWABS REGULAR) PADS USE AS DIRECTED TWICE DAILY 07/26/22   McDonough, Salomon Fick, PA-C  allopurinol (ZYLOPRIM) 100 MG tablet Take 1 tablet (100 mg total) by mouth daily. 12/31/22   McDonough, Salomon Fick, PA-C  apixaban (ELIQUIS) 5 MG TABS tablet Take 1 tablet (5 mg total) by mouth 2 (two) times daily. 12/31/22   End, Cristal Deer, MD  Blood Glucose Monitoring Suppl (ACCU-CHEK GUIDE ME) w/Device KIT Use as directed  dx E11.65 07/30/22   McDonough, Salomon Fick, PA-C  Calcium Carb-Cholecalciferol (CALCIUM 500/D) 500-5 MG-MCG TABS Take 1 tab po daily 12/31/22   Carlean Jews, PA-C  Continuous Blood Gluc Receiver (DEXCOM G7 RECEIVER) DEVI Use for continuous blood glucose monitoring  DX E11.65 11/09/21   McDonough, Lauren K, PA-C  Continuous Glucose Sensor (DEXCOM G7 SENSOR) MISC  USE AS DIRECTED FOR CONTINUOUS  GLUCOSE MONITORING. CHANGE  SENSOR EVERY 10 DAYS 10/06/22   McDonough, Salomon Fick, PA-C  escitalopram (LEXAPRO) 10 MG tablet Take 1 tablet daily for anxiety/sleep. 12/31/22   McDonough, Salomon Fick, PA-C  folic acid (FOLVITE) 1 MG tablet Take 1 tablet (1 mg total) by mouth daily. 12/31/22   McDonough, Salomon Fick, PA-C  furosemide (LASIX) 20 MG tablet TAKE 1 TABLET BY MOUTH EVERY DAY 01/04/23   McDonough, Salomon Fick, PA-C  glucose blood (ACCU-CHEK GUIDE) test strip Use as instructed twice a day DX E11.65 08/03/22   McDonough, Lauren K, PA-C  ibuprofen (ADVIL) 200 MG tablet Take 200 mg by mouth as needed for headache or moderate pain.    [provider]  insulin glargine, 1 Unit Dial,  (TOUJEO SOLOSTAR) 300 UNIT/ML Solostar Pen Inject 10 Units into the skin daily with supper. 12/31/22   McDonough, Salomon Fick, PA-C  Insulin Pen Needle (BD PEN NEEDLE NANO 2ND GEN) 32G X 4 MM MISC USE AS DIRECTED WITH TOUJEO 12/31/22   McDonough, Lauren K, PA-C  metoprolol tartrate (LOPRESSOR) 25 MG tablet Take 1 tablet (25 mg total) by mouth 2 (two) times daily. 01/24/23   Dunn, Raymon Mutton, PA-C  mirtazapine (REMERON) 7.5 MG tablet TAKE 1 TABLET BY MOUTH DAILY AT  BEDTIME 12/31/22   McDonough, Lauren K, PA-C  potassium chloride (KLOR-CON) 8 MEQ tablet TAKE 1 TABLET BY MOUTH EVERY DAY 01/28/23   End, Cristal Deer, MD  Safety Lancets 28G MISC Use as directed once a daily Dx e11.65 07/30/22   McDonough, Salomon Fick, PA-C  spironolactone (ALDACTONE) 25 MG tablet Take 1 tablet (25 mg total) by mouth daily. 12/31/22   McDonough, Salomon Fick, PA-C  thiamine (VITAMIN B-1) 100 MG tablet Take 1 tablet (100 mg total) by mouth daily. 01/04/23   McDonough, Salomon Fick, PA-C  traZODone (DESYREL) 50 MG tablet TAKE 1 TO 2 TABLETS BY MOUTH AT  BEDTIME FOR INSOMNIA 12/31/22   McDonough, Salomon Fick, PA-C     Family History  Problem Relation Age of Onset   Heart attack Brother 44    Social History   Socioeconomic History   Marital status: Divorced    Spouse name: Not on file   Number of children: Not on file   Years of education: Not on file   Highest education level: Not on file  Occupational History   Not on file  Tobacco Use   Smoking status: Never   Smokeless tobacco: Current    Types: Snuff  Vaping Use   Vaping status: Never Used  Substance and Sexual Activity   Alcohol use: Yes    Alcohol/week: 5.0 standard drinks of alcohol    Types: 5 Cans of beer per week   Drug use: No   Sexual activity: Not Currently  Other Topics Concern   Not on file  Social History Narrative   Lives alone   Social Determinants of Health   Financial Resource Strain: Not on file  Food Insecurity: No Food Insecurity (02/14/2023)   Hunger  Vital Sign    Worried About Running Out of Food in the Last Year: Never true    Ran Out of Food in the Last Year: Never true  Transportation Needs: No Transportation Needs (02/14/2023)   PRAPARE - Administrator, Civil Service (Medical): No    Lack of Transportation (Non-Medical): No  Physical Activity: Not on file  Stress: Not on file  Social Connections: Not on file  Review of Systems  Review of Systems: A 12 point ROS discussed and pertinent positives are indicated in the HPI above.  All other systems are negative.  Advance Care Plan: The advanced care plan/surrogate decision maker was discussed at the time of visit and the patient did not wish to discuss or was not able to name a surrogate decision maker or provide an advance care plan.    Physical Exam No direct physical exam was performed (except for noted visual exam findings with Video Visits).    Vital Signs: There were no vitals taken for this visit.  Imaging: IR Radiologist Eval & Mgmt  Result Date: 02/22/2023 EXAM: ESTABLISHED PATIENT OFFICE VISIT CHIEF COMPLAINT: SEE NOTE IN EPIC HISTORY OF PRESENT ILLNESS: SEE NOTE IN EPIC REVIEW OF SYSTEMS: SEE NOTE IN EPIC PHYSICAL EXAMINATION: SEE NOTE IN EPIC ASSESSMENT AND PLAN: SEE NOTE IN EPIC Electronically Signed   By: Malachy Moan M.D.   On: 02/22/2023 15:12   DG Shoulder Left  Result Date: 02/14/2023 CLINICAL DATA:  fall EXAM: LEFT SHOULDER - 2+ VIEW COMPARISON:  None Available. FINDINGS: Acute comminuted and displaced left humeral head and neck fracture. No dislocation. Mild to moderate degenerative changes of the shoulder. Otherwise no aggressive appearing focal bone abnormality. Soft tissues are unremarkable. IMPRESSION: Acute comminuted and displaced left humeral head and neck fracture. Electronically Signed   By: Tish Frederickson M.D.   On: 02/14/2023 20:01   DG Chest Portable 1 View  Result Date: 02/14/2023 CLINICAL DATA:  Larey Seat, left shoulder pain  EXAM: PORTABLE CHEST 1 VIEW COMPARISON:  12/08/2022 FINDINGS: Single frontal view of the chest demonstrates an unremarkable cardiac silhouette. Lung volumes are diminished, without airspace disease, effusion, or pneumothorax. Comminuted intra-articular left humeral head and neck fracture. No other acute bony abnormalities. IMPRESSION: 1. Comminuted left humeral head and neck fracture. 2. Low lung volumes.  No acute airspace disease. Electronically Signed   By: Sharlet Salina M.D.   On: 02/14/2023 20:01   CT HEAD WO CONTRAST ( )  Result Date: 02/14/2023 CLINICAL DATA:  Head trauma, minor (Age >= 65y); Neck trauma (Age >= 65y). Fall. EXAM: CT HEAD WITHOUT CONTRAST CT CERVICAL SPINE WITHOUT CONTRAST TECHNIQUE: Multidetector CT imaging of the head and cervical spine was performed following the standard protocol without intravenous contrast. Multiplanar CT image reconstructions of the cervical spine were also generated. RADIATION DOSE REDUCTION: This exam was performed according to the departmental dose-optimization program which includes automated exposure control, adjustment of the mA and/or kV according to patient size and/or use of iterative reconstruction technique. COMPARISON:  Head CT 11/17/2022.  CT cervical spine 09/22/2022. FINDINGS: CT HEAD FINDINGS Brain: No acute hemorrhage. 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 Cortical gray-white differentiation is otherwise preserved. Prominence of the ventricles and sulci within expected range for age. No hydrocephalus or extra-axial collection. No mass effect or midline shift. Vascular: No hyperdense vessel or unexpected calcification. Skull: Prior right frontoparietal craniotomy. No calvarial fracture or suspicious bone lesion. Skull base is unremarkable. Sinuses/Orbits: No acute finding. Other: None. CT CERVICAL SPINE FINDINGS Alignment: Normal. Skull  base and vertebrae: No acute fracture. Normal craniocervical junction. No suspicious bone lesions. Soft tissues and spinal canal: No prevertebral fluid or swelling. No visible canal hematoma. Disc levels: Unchanged multilevel cervical spondylosis, worst at C3-4, where there is at least moderate spinal canal stenosis. Upper chest: No acute findings. Other: Atherosclerotic calcifications of the carotid  bulbs. IMPRESSION: 1. No acute intracranial abnormality. 2. No acute cervical spine fracture or traumatic listhesis. 3. Unchanged multilevel cervical spondylosis, worst at C3-4, where there is at least moderate spinal canal stenosis. Electronically Signed   By: Orvan Falconer M.D.   On: 02/14/2023 18:22   CT Cervical Spine Wo Contrast  Result Date: 02/14/2023 CLINICAL DATA:  Head trauma, minor (Age >= 65y); Neck trauma (Age >= 65y). Fall. EXAM: CT HEAD WITHOUT CONTRAST CT CERVICAL SPINE WITHOUT CONTRAST TECHNIQUE: Multidetector CT imaging of the head and cervical spine was performed following the standard protocol without intravenous contrast. Multiplanar CT image reconstructions of the cervical spine were also generated. RADIATION DOSE REDUCTION: This exam was performed according to the departmental dose-optimization program which includes automated exposure control, adjustment of the mA and/or kV according to patient size and/or use of iterative reconstruction technique. COMPARISON:  Head CT 11/17/2022.  CT cervical spine 09/22/2022. FINDINGS: CT HEAD FINDINGS Brain: No acute hemorrhage. 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 Cortical gray-white differentiation is otherwise preserved. Prominence of the ventricles and sulci within expected range for age. No hydrocephalus or extra-axial collection. No mass effect or midline shift. Vascular: No hyperdense vessel or unexpected calcification. Skull: Prior  right frontoparietal craniotomy. No calvarial fracture or suspicious bone lesion. Skull base is unremarkable. Sinuses/Orbits: No acute finding. Other: None. CT CERVICAL SPINE FINDINGS Alignment: Normal. Skull base and vertebrae: No acute fracture. Normal craniocervical junction. No suspicious bone lesions. Soft tissues and spinal canal: No prevertebral fluid or swelling. No visible canal hematoma. Disc levels: Unchanged multilevel cervical spondylosis, worst at C3-4, where there is at least moderate spinal canal stenosis. Upper chest: No acute findings. Other: Atherosclerotic calcifications of the carotid bulbs. IMPRESSION: 1. No acute intracranial abnormality. 2. No acute cervical spine fracture or traumatic listhesis. 3. Unchanged multilevel cervical spondylosis, worst at C3-4, where there is at least moderate spinal canal stenosis. Electronically Signed   By: Orvan Falconer M.D.   On: 02/14/2023 18:22    Labs:  CBC: Recent Labs    02/19/23 0329 02/20/23 0740 02/21/23 0353 02/22/23 0448  WBC 5.6 8.2 7.2 5.0  HGB 7.0* 8.0* 7.3* 8.6*  HCT 21.1* 23.8* 22.3* 26.7*  PLT 122* 160 167 172    COAGS: Recent Labs    06/17/22 1349 06/18/22 0446 09/22/22 0023 09/30/22 0935 11/18/22 0430  INR 1.4* 1.8* 1.4* 1.5* 1.7*  APTT 30  --   --  33  --     BMP: Recent Labs    02/19/23 0329 02/20/23 0740 02/21/23 0353 02/22/23 0448  NA 131* 134* 132* 132*  K 4.4 4.3 4.1 3.9  CL 98 100 99 100  CO2 26 28 25 25   GLUCOSE 214* 136* 130* 168*  BUN 46* 34* 30* 26*  CALCIUM 8.3* 8.5* 8.3* 8.6*  CREATININE 1.75* 1.45* 1.30* 1.27*  GFRNONAA 31* 39* 45* 46*    LIVER FUNCTION TESTS: Recent Labs    11/17/22 2307 12/08/22 2224 12/22/22 1424 02/14/23 1530  BILITOT 0.8 0.7 1.5* 0.7  AST 44* 77* 64* 31  ALT 16 27 32 16  ALKPHOS 65 102 77 52  PROT 8.8* 8.4* 7.9 7.8  ALBUMIN 2.9* 2.5* 2.6* 2.7*    TUMOR MARKERS: No results for input(s): "AFPTM", "CEA", "CA199", "CHROMGRNA" in the last 8760  hours.  Assessment and Plan:  Pleasant 67 year old female with a history of Elita Boone cirrhosis complicated by hepatocellular carcinoma.  I spoke with her over the phone today, unfortunately she is currently admitted to the hospital after falling and suffering a proximal left humerus fracture.  She was therefore unable to get her surveillance MRI last week.  She is recovering well in the hospital and has no active complaints related to her liver.  We will continue surveillance imaging when she has had a chance to heal and is able to tolerate the MRI.  1.) Re-attempt MRI with gado and clinic visit in 3 months     Electronically Signed: Sterling Big 02/22/2023, 4:03 PM   I spent a total of  5 Minutes in remote  clinical consultation, greater than 50% of which was counseling/coordinating care for hepatocellular carcinoma.    Visit type: Audio only (telephone). Audio (no video) only due to patient preference. Alternative for in-person consultation at [facility], [address] [place], [place], [place]. This visit type was conducted due to national recommendations for restrictions regarding the COVID-19 Pandemic (e.g. social distancing).  This format is felt to be most appropriate for this patient at this time.  All issues noted in this document were discussed and addressed.

## 2023-02-23 DIAGNOSIS — S42352A Displaced comminuted fracture of shaft of humerus, left arm, initial encounter for closed fracture: Secondary | ICD-10-CM | POA: Diagnosis not present

## 2023-02-23 DIAGNOSIS — F1020 Alcohol dependence, uncomplicated: Secondary | ICD-10-CM

## 2023-02-23 DIAGNOSIS — W19XXXA Unspecified fall, initial encounter: Secondary | ICD-10-CM | POA: Diagnosis not present

## 2023-02-23 LAB — CBC
HCT: 25.3 % — ABNORMAL LOW (ref 36.0–46.0)
Hemoglobin: 8 g/dL — ABNORMAL LOW (ref 12.0–15.0)
MCH: 32.3 pg (ref 26.0–34.0)
MCHC: 31.6 g/dL (ref 30.0–36.0)
MCV: 102 fL — ABNORMAL HIGH (ref 80.0–100.0)
Platelets: 171 10*3/uL (ref 150–400)
RBC: 2.48 MIL/uL — ABNORMAL LOW (ref 3.87–5.11)
RDW: 19.7 % — ABNORMAL HIGH (ref 11.5–15.5)
WBC: 4.8 10*3/uL (ref 4.0–10.5)
nRBC: 0.4 % — ABNORMAL HIGH (ref 0.0–0.2)

## 2023-02-23 LAB — BASIC METABOLIC PANEL
Anion gap: 7 (ref 5–15)
BUN: 26 mg/dL — ABNORMAL HIGH (ref 8–23)
CO2: 26 mmol/L (ref 22–32)
Calcium: 8.6 mg/dL — ABNORMAL LOW (ref 8.9–10.3)
Chloride: 102 mmol/L (ref 98–111)
Creatinine, Ser: 1.25 mg/dL — ABNORMAL HIGH (ref 0.44–1.00)
GFR, Estimated: 47 mL/min — ABNORMAL LOW (ref >60)
Glucose, Bld: 164 mg/dL — ABNORMAL HIGH (ref 70–99)
Potassium: 4.3 mmol/L (ref 3.5–5.1)
Sodium: 135 mmol/L (ref 135–145)

## 2023-02-23 LAB — GLUCOSE, CAPILLARY
Glucose-Capillary: 134 mg/dL — ABNORMAL HIGH (ref 70–99)
Glucose-Capillary: 174 mg/dL — ABNORMAL HIGH (ref 70–99)
Glucose-Capillary: 130 mg/dL — ABNORMAL HIGH (ref 70–99)
Glucose-Capillary: 149 mg/dL — ABNORMAL HIGH (ref 70–99)

## 2023-02-23 MED ORDER — GUAIFENESIN 100 MG/5ML PO LIQD
5.0000 mL | ORAL | Status: DC | PRN
  Administered 2023-02-23 – 2023-02-27 (×2): 5 mL via ORAL
  Filled 2023-02-23 (×2): qty 10

## 2023-02-23 MED ORDER — HYDROCODONE-ACETAMINOPHEN 5-325 MG PO TABS
1.0000 | ORAL_TABLET | ORAL | Status: DC | PRN
  Administered 2023-02-23 – 2023-02-25 (×5): 1 via ORAL
  Filled 2023-02-23 (×5): qty 1

## 2023-02-23 MED ORDER — INSULIN GLARGINE-YFGN 100 UNIT/ML ~~LOC~~ SOLN
10.0000 [IU] | Freq: Every day | SUBCUTANEOUS | Status: DC
  Administered 2023-02-24 – 2023-02-25 (×2): 10 [IU] via SUBCUTANEOUS
  Filled 2023-02-23 (×3): qty 0.1

## 2023-02-23 MED ORDER — APIXABAN 5 MG PO TABS
5.0000 mg | ORAL_TABLET | Freq: Two times a day (BID) | ORAL | Status: DC
  Administered 2023-02-23 – 2023-02-28 (×10): 5 mg via ORAL
  Filled 2023-02-23 (×10): qty 1

## 2023-02-23 NOTE — Plan of Care (Signed)
  Problem: Fluid Volume: Goal: Hemodynamic stability will improve Outcome: Progressing   Problem: Clinical Measurements: Goal: Diagnostic test results will improve Outcome: Progressing Goal: Signs and symptoms of infection will decrease Outcome: Progressing   Problem: Respiratory: Goal: Ability to maintain adequate ventilation will improve Outcome: Progressing   Problem: Education: Goal: Knowledge of General Education information will improve Description: Including pain rating scale, medication(s)/side effects and non-pharmacologic comfort measures Outcome: Progressing   Problem: Health Behavior/Discharge Planning: Goal: Ability to manage health-related needs will improve Outcome: Progressing   Problem: Clinical Measurements: Goal: Ability to maintain clinical measurements within normal limits will improve Outcome: Progressing Goal: Will remain free from infection Outcome: Progressing Goal: Diagnostic test results will improve Outcome: Progressing Goal: Respiratory complications will improve Outcome: Progressing Goal: Cardiovascular complication will be avoided Outcome: Progressing   Problem: Activity: Goal: Risk for activity intolerance will decrease Outcome: Progressing   Problem: Nutrition: Goal: Adequate nutrition will be maintained Outcome: Progressing   Problem: Coping: Goal: Level of anxiety will decrease Outcome: Progressing   Problem: Elimination: Goal: Will not experience complications related to bowel motility Outcome: Progressing Goal: Will not experience complications related to urinary retention Outcome: Progressing   Problem: Pain Management: Goal: General experience of comfort will improve Outcome: Progressing   Problem: Safety: Goal: Ability to remain free from injury will improve Outcome: Progressing   Problem: Skin Integrity: Goal: Risk for impaired skin integrity will decrease Outcome: Progressing   Problem: Education: Goal:  Ability to describe self-care measures that may prevent or decrease complications (Diabetes Survival Skills Education) will improve Outcome: Progressing Goal: Individualized Educational Video(s) Outcome: Progressing   Problem: Coping: Goal: Ability to adjust to condition or change in health will improve Outcome: Progressing   Problem: Fluid Volume: Goal: Ability to maintain a balanced intake and output will improve Outcome: Progressing   Problem: Health Behavior/Discharge Planning: Goal: Ability to identify and utilize available resources and services will improve Outcome: Progressing Goal: Ability to manage health-related needs will improve Outcome: Progressing   Problem: Metabolic: Goal: Ability to maintain appropriate glucose levels will improve Outcome: Progressing   Problem: Nutritional: Goal: Maintenance of adequate nutrition will improve Outcome: Progressing Goal: Progress toward achieving an optimal weight will improve Outcome: Progressing   Problem: Skin Integrity: Goal: Risk for impaired skin integrity will decrease Outcome: Progressing   Problem: Tissue Perfusion: Goal: Adequacy of tissue perfusion will improve Outcome: Progressing

## 2023-02-23 NOTE — Plan of Care (Signed)
  Problem: Fluid Volume: Goal: Hemodynamic stability will improve Outcome: Progressing   Problem: Clinical Measurements: Goal: Diagnostic test results will improve Outcome: Progressing Goal: Signs and symptoms of infection will decrease Outcome: Progressing   Problem: Respiratory: Goal: Ability to maintain adequate ventilation will improve Outcome: Progressing   Problem: Clinical Measurements: Goal: Ability to maintain clinical measurements within normal limits will improve Outcome: Progressing Goal: Will remain free from infection Outcome: Progressing Goal: Diagnostic test results will improve Outcome: Progressing Goal: Respiratory complications will improve Outcome: Progressing Goal: Cardiovascular complication will be avoided Outcome: Progressing   Problem: Activity: Goal: Risk for activity intolerance will decrease Outcome: Progressing   Problem: Nutrition: Goal: Adequate nutrition will be maintained Outcome: Progressing   Problem: Coping: Goal: Level of anxiety will decrease Outcome: Progressing   Problem: Elimination: Goal: Will not experience complications related to bowel motility Outcome: Progressing Goal: Will not experience complications related to urinary retention Outcome: Progressing   Problem: Pain Management: Goal: General experience of comfort will improve Outcome: Progressing   Problem: Safety: Goal: Ability to remain free from injury will improve Outcome: Progressing   Problem: Skin Integrity: Goal: Risk for impaired skin integrity will decrease Outcome: Progressing

## 2023-02-23 NOTE — Progress Notes (Addendum)
Mobility Specialist - Progress Note   02/23/23 1000  Mobility  Activity Ambulated with assistance in room  Level of Assistance Standby assist, set-up cues, supervision of patient - no hands on  Assistive Device Other (Comment) (Hemi-RW)  Distance Ambulated (ft) 20 ft  Activity Response Tolerated well  $Mobility charge 1 Mobility     Pt lying in bed upon arrival, utilizing RA. Pt agreeable to activity. Completed bed mobility minA and STS with modA. Ambulation with minG. No LOB. Mildly unsteady. No complaints. Pt declined bed-chair transfer and returned semi-supine with alarm set, needs in reach.    Adriana Miller Mobility Specialist 02/23/23, 10:16 AM

## 2023-02-23 NOTE — Progress Notes (Signed)
Progress Note Patient: Adriana Miller UJW:[id number] DOB: 05-Jul-1955 DOA: 02/14/2023     9 DOS: the patient was seen and examined on 02/23/2023   Brief hospital course: 67 y.o. female with medical history significant for Persistent PAF/flutter on apixaban s/p failed DCCV 12/27/2022, DM2, CKD stage III-IV, cirrhosis  and hepatocellular carcinoma, seizure disorder, cerebral aneurysm status post repair, HTN, and HLD, frequent falls.    Presents to ED due to fall resulting in left proximal humerus fracture.   10/29: Orthopedic seen.  Recommends conservative management. 10/30: Rapid response called as she lost consciousness briefly while working with PT.  10/31: feels better, advised to work with PT for safe disposition plan. 11/1: Hb drop noted, no active bleeding though on Eliquis. 1 Unit PRBC ordered. 11/2: Hb not much improved, 7 today after blood transfusion. Eliquis held. She denies any active bleeding, did not get out of bed. 11/3: Hb 8, feels much improved, worked with PT recommended SNF placement. 11/4- 11/5: Awaiting SNF placement. FL2 signed. 11/6: hgb stable around 8 without acute bleeding. Significant bruising of both arms. Will attempt to restart eliquis today and recheck hgb tomorrow. She is ready to be discharged to SNF when bed available.  Assessment and Plan: * Comminuted left humeral fracture, closed, initial encounter Left arm in sling. Has large bruise. Hb stable. Pain control with tylenol. Advised to limit opioids. Ortho outpatient follow up. PT advised SNF placement.  TOC to work on safe disposition plan.  Accidental fall versus fall related to alcohol intoxication Syncopal episode in hospital. Possibly from dehydration, acute anemia. Orthostatic vitals improving. Continue telemetry. Encourage oral hydration.  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 Resumed home dose Lasix, aldactone.  Acute on Chronic Anemia: s/p 1 unit PRBC 11/1  and 3 days of IV iron. Hgb stable at 8.  - restart eliquis - CBC am  Elevated ETOH level Alcohol use disorder with possible acute intoxication Alcoholic cirrhosis without ascites Alcohol cessation counseled No withdrawal symptoms. Thiamine, folate, multivitamin. Advised to limit opiates.   History of recurrent UTI (urinary tract infection) Sepsis secondary to E. coli in August 2024 UA concerning for UTI.  finished antibiotics total 7 days.   Persistent atrial fibrillation (HCC) Chronic anticoagulation History of unsuccessful DCCV in September 2024 Continue metoprolol.  - resume Eliquis. Last seen by cardiology 10/7   Essential hypertension HFpEF Clinically compensated Lasix, spironolactone held, continue metoprolol.    Uncontrolled type 2 diabetes mellitus with hyperglycemia, with long-term current use of insulin (HCC) Continue Semglee 10 with sliding scale coverage Diabetes education provided. Weight reduction advised.   History of cerebral aneurysm repair No acute issues suspected.  Head CT was nonacute.  Has constipation on senna, laxative Out of bed to chair. Incentive spirometry. Nursing supportive care. Fall, aspiration precautions. DVT prophylaxis   Code Status: Do not attempt resuscitation (DNR) PRE-ARREST INTERVENTIONS DESIRED  Subjective: Patient reports feeling well overall. Pain is managed at rest. She is awaiting SNF. Her biggest worry is losing her apartment if she has to go to a facility.   Physical Exam: Vitals:   02/21/23 2311 02/22/23 0824 02/22/23 1851 02/22/23 2335  BP: 117/70 106/75 112/62 118/65  Pulse: 87 91 (!) 103 81  Resp: 18  18 18   Temp: 97.9 F (36.6 C) 98.1 F (36.7 C) 98 F (36.7 C) 98.4 F (36.9 C)  TempSrc: Oral  Oral   SpO2: 99% 95% 100% 99%  Weight:      Height:  General - no apparent distress HEENT - PERRLA, EOMI, atraumatic head, non tender sinuses. Lung - Clear, basal rales, no rhonchi, wheezes. Heart - S1, S2  heard, tachycardia, trace pedal edema. Neuro - Alert, awake and oriented x 3, non focal exam. Skin - Warm and dry. Significant ecchymosis bilateral arms. Sling intact.  Data Reviewed:    Latest Ref Rng & Units 02/23/2023    4:25 AM 02/22/2023    4:48 AM 02/21/2023    3:53 AM  CBC  WBC 4.0 - 10.5 K/uL 4.8  5.0  7.2   Hemoglobin 12.0 - 15.0 g/dL 8.0  8.6  7.3   Hematocrit 36.0 - 46.0 % 25.3  26.7  22.3   Platelets 150 - 400 K/uL 171  172  167       Latest Ref Rng & Units 02/23/2023    4:25 AM 02/22/2023    4:48 AM 02/21/2023    3:53 AM  BMP  Glucose 70 - 99 mg/dL 161  096  045   BUN 8 - 23 mg/dL 26  26  30    Creatinine 0.44 - 1.00 mg/dL 4.09  8.11  9.14   Sodium 135 - 145 mmol/L 135  132  132   Potassium 3.5 - 5.1 mmol/L 4.3  3.9  4.1   Chloride 98 - 111 mmol/L 102  100  99   CO2 22 - 32 mmol/L 26  25  25    Calcium 8.9 - 10.3 mg/dL 8.6  8.6  8.3    IR Radiologist Eval & Mgmt  Result Date: 02/22/2023 EXAM: ESTABLISHED PATIENT OFFICE VISIT CHIEF COMPLAINT: SEE NOTE IN EPIC HISTORY OF PRESENT ILLNESS: SEE NOTE IN EPIC REVIEW OF SYSTEMS: SEE NOTE IN EPIC PHYSICAL EXAMINATION: SEE NOTE IN EPIC ASSESSMENT AND PLAN: SEE NOTE IN EPIC Electronically Signed   By: Malachy Moan M.D.   On: 02/22/2023 15:12     Family Communication: none at bedside  Disposition: Status is: Inpatient Remains inpatient appropriate because:  Rehab placement.  Planned Discharge Destination: Skilled nursing facility     Time spent 39 min.  Author: Leeroy Bock, MD 02/23/2023 7:53 AM Secure chat 7am to 7pm For on call review [email or web address].

## 2023-02-23 NOTE — Progress Notes (Signed)
Physical Therapy Treatment Patient Details Name: Adriana Miller MRN: [id number] DOB: 1955/05/03 Today's Date: 02/23/2023   History of Present Illness Pt is a 66 yo female presenting  with a comminuted L humeral fracture after a fall injuring her L shoulder. PMH includes hx of falls, elevated ETOH levels, alcohol use disorder, recurrent UTIs, HTN, persistant a-fib, CHF with preserved EF, seizures, DM, cerebral aneurysm repair, and stage 3b CKD.    PT Comments  Pt was long sitting in bed upon arrival. She requested author adjust sling prior to OOB activity. Pt was able to advance OOB/ gait short distances but DC recs remain appropriate. Acute PT will continue to follow and progress per current POC.     If plan is discharge home, recommend the following: A lot of help with walking and/or transfers;Assistance with cooking/housework;Direct supervision/assist for medications management;Assist for transportation;Help with stairs or ramp for entrance;A lot of help with bathing/dressing/bathroom     Equipment Recommendations  Other (comment) (Defer to next level of care)       Precautions / Restrictions Precautions Precautions: Fall Restrictions Weight Bearing Restrictions: Yes LUE Weight Bearing: Non weight bearing     Mobility  Bed Mobility Overal bed mobility: Needs Assistance Bed Mobility: Supine to Sit, Sit to Supine  Supine to sit: Min assist Sit to supine: Mod assist     Transfers Overall transfer level: Needs assistance Equipment used: 1 person hand held assist Transfers: Sit to/from Stand Sit to Stand: Min assist     Ambulation/Gait Ambulation/Gait assistance: Min assist Gait Distance (Feet): 45 Feet Assistive device: 1 person hand held assist Gait Pattern/deviations: Step-through pattern, Trunk flexed  General Gait Details: pt fatigued quickly but was able to ambulate almost out to RN desk prior to returning to room and bed. unwilling to sit in recliner " Its too  hard."    Balance Overall balance assessment: Needs assistance Sitting-balance support: Feet supported, No upper extremity supported Sitting balance-Leahy Scale: Good     Standing balance support: Single extremity supported, During functional activity Standing balance-Leahy Scale: Fair      Cognition Arousal: Alert Behavior During Therapy: WFL for tasks assessed/performed Overall Cognitive Status: No family/caregiver present to determine baseline cognitive functioning Area of Impairment: Problem solving, Safety/judgement, Awareness, Memory  Memory: Decreased recall of precautions, Decreased short-term memory   Safety/Judgement: Decreased awareness of safety, Decreased awareness of deficits   Problem Solving: Slow processing General Comments: Alert, cooperative, and pleasant.               Pertinent Vitals/Pain Pain Assessment Pain Assessment: 0-10 Pain Score: 5  Pain Location: L shoulder Pain Descriptors / Indicators: Aching, Grimacing, Throbbing, Discomfort Pain Intervention(s): Limited activity within patient's tolerance, Monitored during session, Premedicated before session, Repositioned     PT Goals (current goals can now be found in the care plan section) Acute Rehab PT Goals Patient Stated Goal: decrease the pain Progress towards PT goals: Progressing toward goals    Frequency    7X/week       AM-PAC PT "6 Clicks" Mobility   Outcome Measure  Help needed turning from your back to your side while in a flat bed without using bedrails?: A Little Help needed moving from lying on your back to sitting on the side of a flat bed without using bedrails?: A Little Help needed moving to and from a bed to a chair (including a wheelchair)?: A Little Help needed standing up from a chair using your arms (e.g., wheelchair or bedside  chair)?: A Little Help needed to walk in hospital room?: A Little Help needed climbing 3-5 steps with a railing? : A Lot 6 Click Score:  17    End of Session   Activity Tolerance: Patient tolerated treatment well Patient left: in bed;with call bell/phone within reach;with bed alarm set Nurse Communication: Mobility status PT Visit Diagnosis: Unsteadiness on feet (R26.81);Other abnormalities of gait and mobility (R26.89);Repeated falls (R29.6);History of falling (Z91.81);Muscle weakness (generalized) (M62.81);Difficulty in walking, not elsewhere classified (R26.2);Pain Pain - Right/Left: Left Pain - part of body: Shoulder     Time: 0981-1914 PT Time Calculation (min) (ACUTE ONLY): 14 min  Charges:    $Gait Training: 8-22 mins PT General Charges $$ ACUTE PT VISIT: 1 Visit                    Jetta Lout PTA 02/23/23, 4:32 PM

## 2023-02-24 DIAGNOSIS — F1010 Alcohol abuse, uncomplicated: Secondary | ICD-10-CM | POA: Diagnosis not present

## 2023-02-24 DIAGNOSIS — S42352A Displaced comminuted fracture of shaft of humerus, left arm, initial encounter for closed fracture: Secondary | ICD-10-CM | POA: Diagnosis not present

## 2023-02-24 DIAGNOSIS — W19XXXA Unspecified fall, initial encounter: Secondary | ICD-10-CM | POA: Diagnosis not present

## 2023-02-24 LAB — GLUCOSE, CAPILLARY
Glucose-Capillary: 160 mg/dL — ABNORMAL HIGH (ref 70–99)
Glucose-Capillary: 230 mg/dL — ABNORMAL HIGH (ref 70–99)
Glucose-Capillary: 265 mg/dL — ABNORMAL HIGH (ref 70–99)
Glucose-Capillary: 246 mg/dL — ABNORMAL HIGH (ref 70–99)

## 2023-02-24 LAB — CBC
HCT: 25.7 % — ABNORMAL LOW (ref 36.0–46.0)
Hemoglobin: 8.3 g/dL — ABNORMAL LOW (ref 12.0–15.0)
MCH: 32.7 pg (ref 26.0–34.0)
MCHC: 32.3 g/dL (ref 30.0–36.0)
MCV: 101.2 fL — ABNORMAL HIGH (ref 80.0–100.0)
Platelets: 175 10*3/uL (ref 150–400)
RBC: 2.54 MIL/uL — ABNORMAL LOW (ref 3.87–5.11)
RDW: 20.7 % — ABNORMAL HIGH (ref 11.5–15.5)
WBC: 5.9 10*3/uL (ref 4.0–10.5)
nRBC: 0 % (ref 0.0–0.2)

## 2023-02-24 NOTE — Plan of Care (Signed)
  Problem: Education: Goal: Knowledge of General Education information will improve Description: Including pain rating scale, medication(s)/side effects and non-pharmacologic comfort measures Outcome: Progressing   Problem: Activity: Goal: Risk for activity intolerance will decrease Outcome: Progressing   Problem: Nutrition: Goal: Adequate nutrition will be maintained Outcome: Progressing   Problem: Coping: Goal: Level of anxiety will decrease Outcome: Progressing   Problem: Elimination: Goal: Will not experience complications related to bowel motility Outcome: Progressing Goal: Will not experience complications related to urinary retention Outcome: Progressing   Problem: Safety: Goal: Ability to remain free from injury will improve Outcome: Progressing   Problem: Skin Integrity: Goal: Risk for impaired skin integrity will decrease Outcome: Progressing   

## 2023-02-24 NOTE — Progress Notes (Signed)
Occupational Therapy Treatment Patient Details Name: Adriana Miller MRN: [id number] DOB: Mar 08, 1956 Today's Date: 02/24/2023   History of present illness Pt is a 67 yo female presenting  with a comminuted L humeral fracture after a fall injuring her L shoulder. PMH includes hx of falls, elevated ETOH levels, alcohol use disorder, recurrent UTIs, HTN, persistant a-fib, CHF with preserved EF, seizures, DM, cerebral aneurysm repair, and stage 3b CKD.   OT comments  Pt seen for OT tx. Pt agreeable, denies pain at rest. Pt required MOD A for bed mobility, MIN-MOD A to stand from elevated EOB with VC for initial anterior weight shift, and CGA once in standing to ambulate in room and into hallway ~35' + 20' + 15' with hemi-walker requiring 2 standing rest breaks as pt endorsed feeling a bit SOB. SpO2 100% on RA, HR in 110's with exertion and back down to 90's once resting. Pt educated in LUE positioning in chair/bed to support neutral positioning as well as sling repositioning for improved fit and comfort. Pt required MAX A for UB dressing including gown mgt and sling mgt. Pt continues to benefit from skilled OT services. D/c recommendation remains most appropriate.       If plan is discharge home, recommend the following:  A lot of help with bathing/dressing/bathroom;Assistance with cooking/housework;Assist for transportation;Help with stairs or ramp for entrance;A little help with walking and/or transfers   Equipment Recommendations  Other (comment) (defer)    Recommendations for Other Services      Precautions / Restrictions Precautions Precautions: Fall Required Braces or Orthoses: Sling Restrictions Weight Bearing Restrictions: Yes LUE Weight Bearing: Non weight bearing       Mobility Bed Mobility Overal bed mobility: Needs Assistance Bed Mobility: Supine to Sit, Sit to Supine     Supine to sit: Mod assist, HOB elevated, Used rails Sit to supine: Min assist, Mod assist         Transfers Overall transfer level: Needs assistance Equipment used: Hemi-walker Transfers: Sit to/from Stand Sit to Stand: From elevated surface, Mod assist, Min assist                 Balance Overall balance assessment: Needs assistance Sitting-balance support: Feet supported, No upper extremity supported Sitting balance-Leahy Scale: Good     Standing balance support: Single extremity supported, During functional activity Standing balance-Leahy Scale: Fair                             ADL either performed or assessed with clinical judgement   ADL Overall ADL's : Needs assistance/impaired                 Upper Body Dressing : Sitting;Maximal assistance Upper Body Dressing Details (indicate cue type and reason): MAX A for sling repositioning and gown mgt                 Functional mobility during ADLs: Contact guard assist (HW)      Extremity/Trunk Assessment              Vision       Perception     Praxis      Cognition Arousal: Alert Behavior During Therapy: WFL for tasks assessed/performed Overall Cognitive Status: No family/caregiver present to determine baseline cognitive functioning Area of Impairment: Problem solving, Safety/judgement, Awareness, Memory  Memory: Decreased recall of precautions, Decreased short-term memory   Safety/Judgement: Decreased awareness of safety, Decreased awareness of deficits   Problem Solving: Slow processing, Requires verbal cues          Exercises Other Exercises Other Exercises: Pt educated in [facility] placement to improve safety with mobility but pt reports it's easier to use when it's out in front of her more. Other Exercises: Pt educated in LUE positioning in chair/bed to support neutral positioning as well as sling repositioning for improved fit and comfort    Shoulder Instructions       General Comments      Pertinent Vitals/ Pain       Pain  Assessment Pain Assessment: 0-10 Pain Score: 6  Pain Location: L shoulder Pain Descriptors / Indicators: Aching, Grimacing, Throbbing, Discomfort Pain Intervention(s): Limited activity within patient's tolerance, Repositioned, Monitored during session, Patient requesting pain meds-RN notified  Home Living                                          Prior Functioning/Environment              Frequency  Min 1X/week        Progress Toward Goals  OT Goals(current goals can now be found in the care plan section)  Progress towards OT goals: Progressing toward goals  Acute Rehab OT Goals Patient Stated Goal: go home and decrease LUE pain OT Goal Formulation: With patient Time For Goal Achievement: 03/02/23 Potential to Achieve Goals: Good  Plan      Co-evaluation                 AM-PAC OT "6 Clicks" Daily Activity     Outcome Measure   Help from another person eating meals?: A Little Help from another person taking care of personal grooming?: A Little Help from another person toileting, which includes using toliet, bedpan, or urinal?: A Lot Help from another person bathing (including washing, rinsing, drying)?: A Lot Help from another person to put on and taking off regular upper body clothing?: A Lot Help from another person to put on and taking off regular lower body clothing?: A Lot 6 Click Score: 14    End of Session Equipment Utilized During Treatment: Gait belt;Other (comment) (shoulder sling, hemiwalker)  OT Visit Diagnosis: Unsteadiness on feet (R26.81);Repeated falls (R29.6);Muscle weakness (generalized) (M62.81)   Activity Tolerance Patient tolerated treatment well   Patient Left in bed;with call bell/phone within reach;with bed alarm set   Nurse Communication Patient requests pain meds        Time: 1610-9604 OT Time Calculation (min): 19 min  Charges: OT General Charges $OT Visit: 1 Visit OT Treatments $Therapeutic  Activity: 8-22 mins  Arman Filter., MPH, MS, OTR/L ascom [phone] 02/24/23, 11:47 AM

## 2023-02-24 NOTE — Progress Notes (Signed)
Physical Therapy Treatment Patient Details Name: Adriana Miller MRN: [id number] DOB: [birth year] Today's Date: 02/24/2023   History of Present Illness Pt is a 67 yo female presenting  with a comminuted L humeral fracture after a fall injuring her L shoulder. PMH includes hx of falls, elevated ETOH levels, alcohol use disorder, recurrent UTIs, HTN, persistant a-fib, CHF with preserved EF, seizures, DM, cerebral aneurysm repair, and stage 3b CKD.    PT Comments  Pt was long sitting in bed upon arrival. She remains A and cooperative. Eager to perform OOB activity however session greatly limited by pt having large BM. She requires extensive assistance to achieve EOB sitting. Once EOB was able to stand from elevated bed height with min assist but requires mod assist from standard height surfaces. Pt started to ambulate out to hallway with use of hemiwalker, however needed to quickly turn around to make it to BR in time for BM. RN tech ware pt was sitting on toilet t conclusion of session. Pt remains far from her baseline and will benefit from continued skilled OPT at DC to maximize her safety and independence with all ADLs.     If plan is discharge home, recommend the following: A lot of help with walking and/or transfers;Assistance with cooking/housework;Direct supervision/assist for medications management;Assist for transportation;Help with stairs or ramp for entrance;A lot of help with bathing/dressing/bathroom     Equipment Recommendations  Other (comment) (defer to next level of care)       Precautions / Restrictions Precautions Precautions: Fall Required Braces or Orthoses: Sling Restrictions Weight Bearing Restrictions: Yes LUE Weight Bearing: Non weight bearing     Mobility  Bed Mobility Overal bed mobility: Needs Assistance Bed Mobility: Supine to Sit  Supine to sit: Mod assist  General bed mobility comments: increased time required with vcs for step by step sequencing     Transfers Overall transfer level: Needs assistance Equipment used: Hemi-walker Transfers: Sit to/from Stand Sit to Stand: Min assist, Mod assist, From elevated surface  General transfer comment: min assist to stand from elevated surfaces but mod assist from lower/standard heights    Ambulation/Gait Ambulation/Gait assistance: Contact guard assist Gait Distance (Feet): 20 Feet Assistive device: Hemi-walker Gait Pattern/deviations: Step-to pattern, Trunk flexed Gait velocity: decreased  General Gait Details: Pt started to ambulate out of her room however quickly needed to turn around to make it to BR for BM. RN tech aware pt in BR with instructions to use call bell.    Balance Overall balance assessment: Needs assistance Sitting-balance support: Feet supported, No upper extremity supported Sitting balance-Leahy Scale: Good     Standing balance support: Single extremity supported, During functional activity Standing balance-Leahy Scale: Fair       Cognition Arousal: Alert Behavior During Therapy: WFL for tasks assessed/performed Overall Cognitive Status: No family/caregiver present to determine baseline cognitive functioning Area of Impairment: Problem solving, Safety/judgement, Awareness, Memory    Memory: Decreased recall of precautions, Decreased short-term memory   Safety/Judgement: Decreased awareness of safety, Decreased awareness of deficits   Problem Solving: Slow processing General Comments: Alert, cooperative, and pleasant.               Pertinent Vitals/Pain Pain Assessment Pain Assessment: 0-10 Pain Score: 3  Pain Location: L shoulder Pain Descriptors / Indicators: Aching, Grimacing, Throbbing, Discomfort Pain Intervention(s): Limited activity within patient's tolerance, Monitored during session, Premedicated before session, Repositioned     PT Goals (current goals can now be found in the care plan section) Acute  Rehab PT Goals Patient Stated Goal:  decrease the pain Progress towards PT goals: Progressing toward goals    Frequency    7X/week       AM-PAC PT "6 Clicks" Mobility   Outcome Measure  Help needed turning from your back to your side while in a flat bed without using bedrails?: A Little Help needed moving from lying on your back to sitting on the side of a flat bed without using bedrails?: A Lot Help needed moving to and from a bed to a chair (including a wheelchair)?: A Little Help needed standing up from a chair using your arms (e.g., wheelchair or bedside chair)?: A Little Help needed to walk in hospital room?: A Little Help needed climbing 3-5 steps with a railing? : A Lot 6 Click Score: 16    End of Session   Activity Tolerance: Patient tolerated treatment well;Other (comment) (limited by [facility]) Patient left: Other (comment) (pt was sitting on BSC/over toilet at conclusion of session. RN tech aware) Nurse Communication: Mobility status PT Visit Diagnosis: Unsteadiness on feet (R26.81);Other abnormalities of gait and mobility (R26.89);Repeated falls (R29.6);History of falling (Z91.81);Muscle weakness (generalized) (M62.81);Difficulty in walking, not elsewhere classified (R26.2);Pain Pain - Right/Left: Left Pain - part of body: Shoulder     Time: 4098-1191 PT Time Calculation (min) (ACUTE ONLY): 14 min  Charges:    $Gait Training: 8-22 mins PT General Charges $$ ACUTE PT VISIT: 1 Visit                     Jetta Lout PTA 02/24/23, 10:24 AM

## 2023-02-24 NOTE — Progress Notes (Signed)
Progress Note Patient: Adriana Miller ZOX:[id number] DOB: October 05, 1955 DOA: 02/14/2023     10 DOS: the patient was seen and examined on 02/24/2023   Brief hospital course: 67 y.o. female with medical history significant for Persistent PAF/flutter on apixaban s/p failed DCCV 12/27/2022, DM2, CKD stage III-IV, cirrhosis  and hepatocellular carcinoma, seizure disorder, cerebral aneurysm status post repair, HTN, and HLD, frequent falls.    Presents to ED due to fall resulting in left proximal humerus fracture.   10/29: Orthopedic seen.  Recommends conservative management. 10/30: Rapid response called as she lost consciousness briefly while working with PT.  10/31: feels better, advised to work with PT for safe disposition plan. 11/1: Hb drop noted, no active bleeding though on Eliquis. 1 Unit PRBC ordered. 11/2: Hb not much improved, 7 today after blood transfusion. Eliquis held. She denies any active bleeding, did not get out of bed. 11/3: Hb 8, feels much improved, worked with PT recommended SNF placement. 11/4- 11/5: Awaiting SNF placement. FL2 signed. 11/6: hgb stable around 8 without acute bleeding. Significant bruising of both arms. Will attempt to restart eliquis today and recheck hgb tomorrow.  11/7: hemoglobin stable and increased even with restarting eliquis. Doing well with PT/OT.  She remains ready to be discharged to SNF when bed available. Engaged TOC for placement.   Assessment and Plan: * Comminuted left humeral fracture, closed, initial encounter Left arm in sling. Has large bruise. Hb stable. Pain control with tylenol. Advised to limit opioids. Ortho outpatient follow up. - PT/ot recommending SNF. Awaiting placement  Accidental fall versus fall related to alcohol intoxication Syncopal episode in hospital. Possibly from dehydration, acute anemia. Orthostatic vitals improving. Continue telemetry. Encourage oral hydration.  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 Resumed home dose Lasix, aldactone.  Acute on Chronic Anemia: s/p 1 unit PRBC 11/1 and 3 days of IV iron. Hgb stable at 8.  - restart eliquis - CBC am  Elevated ETOH level Alcohol use disorder with possible acute intoxication Alcoholic cirrhosis without ascites Alcohol cessation counseled No withdrawal symptoms. Thiamine, folate, multivitamin. Advised to limit opiates.   History of recurrent UTI (urinary tract infection) Sepsis secondary to E. coli in August 2024 UA concerning for UTI.  finished antibiotics total 7 days.   Persistent atrial fibrillation (HCC) Chronic anticoagulation History of unsuccessful DCCV in September 2024 Continue metoprolol.  - resume Eliquis. Last seen by cardiology 10/7   Essential hypertension HFpEF Clinically compensated Lasix, spironolactone held, continue metoprolol.    Uncontrolled type 2 diabetes mellitus with hyperglycemia, with long-term current use of insulin (HCC) Continue Semglee 10 with sliding scale coverage Diabetes education provided. Weight reduction advised.   History of cerebral aneurysm repair No acute issues suspected.  Head CT was nonacute.  Has constipation on senna, laxative Out of bed to chair. Incentive spirometry. Nursing supportive care. Fall, aspiration precautions. DVT prophylaxis   Code Status: Do not attempt resuscitation (DNR) PRE-ARREST INTERVENTIONS DESIRED  Subjective: Patient reports feeling well overall. Pain is managed at rest. She is awaiting SNF. Her biggest worry is losing her apartment if she has to go to a facility.   Physical Exam: Vitals:   02/23/23 0824 02/23/23 1521 02/23/23 2206 02/23/23 2305  BP: 107/73 133/74 117/69 127/64  Pulse: 81 (!) 103 100   Resp: 16 16 18 18   Temp: 98 F (36.7 C) 98.9 F (37.2 C) 98.9 F (37.2 C) 98.6 F (37 C)  TempSrc:   Oral   SpO2:  99% 100%  99%  Weight:      Height:       General - no apparent distress HEENT - PERRLA, EOMI,  atraumatic head, non tender sinuses. Lung - Clear, basal rales, no rhonchi, wheezes. Heart - S1, S2 heard, tachycardia, trace pedal edema. Neuro - Alert, awake and oriented x 3, non focal exam. Skin - Warm and dry. Significant ecchymosis bilateral arms. Sling intact.  Data Reviewed:    Latest Ref Rng & Units 02/24/2023    4:25 AM 02/23/2023    4:25 AM 02/22/2023    4:48 AM  CBC  WBC 4.0 - 10.5 K/uL 5.9  4.8  5.0   Hemoglobin 12.0 - 15.0 g/dL 8.3  8.0  8.6   Hematocrit 36.0 - 46.0 % 25.7  25.3  26.7   Platelets 150 - 400 K/uL 175  171  172       Latest Ref Rng & Units 02/23/2023    4:25 AM 02/22/2023    4:48 AM 02/21/2023    3:53 AM  BMP  Glucose 70 - 99 mg/dL 161  096  045   BUN 8 - 23 mg/dL 26  26  30    Creatinine 0.44 - 1.00 mg/dL 4.09  8.11  9.14   Sodium 135 - 145 mmol/L 135  132  132   Potassium 3.5 - 5.1 mmol/L 4.3  3.9  4.1   Chloride 98 - 111 mmol/L 102  100  99   CO2 22 - 32 mmol/L 26  25  25    Calcium 8.9 - 10.3 mg/dL 8.6  8.6  8.3    IR Radiologist Eval & Mgmt  Result Date: 02/22/2023 EXAM: ESTABLISHED PATIENT OFFICE VISIT CHIEF COMPLAINT: SEE NOTE IN EPIC HISTORY OF PRESENT ILLNESS: SEE NOTE IN EPIC REVIEW OF SYSTEMS: SEE NOTE IN EPIC PHYSICAL EXAMINATION: SEE NOTE IN EPIC ASSESSMENT AND PLAN: SEE NOTE IN EPIC Electronically Signed   By: Malachy Moan M.D.   On: 02/22/2023 15:12    Family Communication: none at bedside  Disposition: Status is: Inpatient Remains inpatient appropriate because:  Rehab placement.  Planned Discharge Destination: Skilled nursing facility     Time spent 39 min.  Author: Leeroy Bock, MD 02/24/2023 7:27 AM Secure chat 7am to 7pm For on call review [email or web address].

## 2023-02-24 NOTE — Plan of Care (Signed)
  Problem: Fluid Volume: Goal: Hemodynamic stability will improve Outcome: Progressing   Problem: Clinical Measurements: Goal: Diagnostic test results will improve Outcome: Progressing Goal: Signs and symptoms of infection will decrease Outcome: Progressing   Problem: Respiratory: Goal: Ability to maintain adequate ventilation will improve Outcome: Progressing   Problem: Education: Goal: Knowledge of General Education information will improve Description: Including pain rating scale, medication(s)/side effects and non-pharmacologic comfort measures Outcome: Progressing   Problem: Health Behavior/Discharge Planning: Goal: Ability to manage health-related needs will improve Outcome: Progressing   Problem: Clinical Measurements: Goal: Ability to maintain clinical measurements within normal limits will improve Outcome: Progressing Goal: Will remain free from infection Outcome: Progressing Goal: Diagnostic test results will improve Outcome: Progressing Goal: Respiratory complications will improve Outcome: Progressing Goal: Cardiovascular complication will be avoided Outcome: Progressing   Problem: Activity: Goal: Risk for activity intolerance will decrease Outcome: Progressing   Problem: Nutrition: Goal: Adequate nutrition will be maintained Outcome: Progressing   Problem: Coping: Goal: Level of anxiety will decrease Outcome: Progressing   Problem: Elimination: Goal: Will not experience complications related to bowel motility Outcome: Progressing Goal: Will not experience complications related to urinary retention Outcome: Progressing   Problem: Pain Management: Goal: General experience of comfort will improve Outcome: Progressing   Problem: Safety: Goal: Ability to remain free from injury will improve Outcome: Progressing   Problem: Skin Integrity: Goal: Risk for impaired skin integrity will decrease Outcome: Progressing   Problem: Education: Goal:  Ability to describe self-care measures that may prevent or decrease complications (Diabetes Survival Skills Education) will improve Outcome: Progressing Goal: Individualized Educational Video(s) Outcome: Progressing   Problem: Coping: Goal: Ability to adjust to condition or change in health will improve Outcome: Progressing   Problem: Fluid Volume: Goal: Ability to maintain a balanced intake and output will improve Outcome: Progressing   Problem: Health Behavior/Discharge Planning: Goal: Ability to identify and utilize available resources and services will improve Outcome: Progressing Goal: Ability to manage health-related needs will improve Outcome: Progressing   Problem: Metabolic: Goal: Ability to maintain appropriate glucose levels will improve Outcome: Progressing   Problem: Nutritional: Goal: Maintenance of adequate nutrition will improve Outcome: Progressing Goal: Progress toward achieving an optimal weight will improve Outcome: Progressing   Problem: Skin Integrity: Goal: Risk for impaired skin integrity will decrease Outcome: Progressing   Problem: Tissue Perfusion: Goal: Adequacy of tissue perfusion will improve Outcome: Progressing

## 2023-02-24 NOTE — TOC Progression Note (Signed)
Transition of Care [facility]) - Progression Note    Patient Details  Name: TYMIRA HORKEY MRN: [id number] Date of Birth: Oct 19, 1955  Transition of Care [facility]) CM/SW Contact  Marlowe Sax, RN Phone Number: 02/24/2023, 4:39 PM  Clinical Narrative:     Met with the patient and she asked me to call her brother Fayrene Fearing and have him decide which bed for STR, I called him and reviewed the bed  offers, He chose [facility], I let him know we will need Ins approval \\prior  to her going       Expected Discharge Plan and Services                                               Social Determinants of Health (SDOH) Interventions SDOH Screenings   Food Insecurity: No Food Insecurity (02/14/2023)  Housing: Patient Declined (02/14/2023)  Transportation Needs: No Transportation Needs (02/14/2023)  Utilities: Not At Risk (02/14/2023)  Alcohol Screen: Low Risk  (01/27/2023)  Depression (PHQ2-9): Low Risk  (01/27/2023)  Tobacco Use: High Risk (02/14/2023)    Readmission Risk Interventions    11/20/2022    2:04 PM 10/01/2022    9:15 AM  Readmission Risk Prevention Plan  Transportation Screening Complete Complete  Medication Review (RN Care Manager) Complete Complete  PCP or Specialist appointment within 3-5 days of discharge Complete Complete  HRI or Home Care Consult Complete Complete  SW Recovery Care/Counseling Consult Complete Complete  Palliative Care Screening Not Applicable Not Applicable  Skilled Nursing Facility Not Applicable Not Applicable

## 2023-02-25 ENCOUNTER — Encounter: Payer: Self-pay | Admitting: Internal Medicine

## 2023-02-25 DIAGNOSIS — S42352A Displaced comminuted fracture of shaft of humerus, left arm, initial encounter for closed fracture: Secondary | ICD-10-CM | POA: Diagnosis not present

## 2023-02-25 LAB — GLUCOSE, CAPILLARY
Glucose-Capillary: 164 mg/dL — ABNORMAL HIGH (ref 70–99)
Glucose-Capillary: 170 mg/dL — ABNORMAL HIGH (ref 70–99)
Glucose-Capillary: 305 mg/dL — ABNORMAL HIGH (ref 70–99)
Glucose-Capillary: 297 mg/dL — ABNORMAL HIGH (ref 70–99)
Glucose-Capillary: 249 mg/dL — ABNORMAL HIGH (ref 70–99)

## 2023-02-25 MED ORDER — ORAL CARE MOUTH RINSE: 15.0000 mL | OROMUCOSAL | Status: DC | PRN

## 2023-02-25 NOTE — Plan of Care (Signed)
  Problem: Fluid Volume: Goal: Hemodynamic stability will improve Outcome: Progressing   Problem: Education: Goal: Knowledge of General Education information will improve Description: Including pain rating scale, medication(s)/side effects and non-pharmacologic comfort measures Outcome: Progressing   Problem: Clinical Measurements: Goal: Ability to maintain clinical measurements within normal limits will improve Outcome: Progressing

## 2023-02-25 NOTE — Progress Notes (Signed)
Physical Therapy Treatment Patient Details Name: Adriana Miller MRN: [id number] DOB: [birth year] Today's Date: 02/25/2023   History of Present Illness Pt is a 67 yo female presenting  with a comminuted L humeral fracture after a fall injuring her L shoulder. PMH includes hx of falls, elevated ETOH levels, alcohol use disorder, recurrent UTIs, HTN, persistant a-fib, CHF with preserved EF, seizures, DM, cerebral aneurysm repair, and stage 3b CKD.    PT Comments  Pt was alert, long sitting in bed, finishing breakfast upon arrival.  She endorses, " I feel better today." Was agreeable to PT session and OOB activity. Pt was able to exit bed with assistance, stand to hemi-walker, and tolerate gait ~ 50 ft. Once fatigued, pt gets anxious with HR elevation to 140s. Resolves quickly to lower 100s with seated rest. Pt is progressing however still unsafe to return home [place]. Recommend STR at DC to maximize her independence and safety with all ADLs. Recommend continued skilled PT at DC to maximize independence and safety with all ADLs.    If plan is discharge home, recommend the following: A lot of help with walking and/or transfers;Assistance with cooking/housework;Direct supervision/assist for medications management;Assist for transportation;Help with stairs or ramp for entrance;A lot of help with bathing/dressing/bathroom     Equipment Recommendations  Other (comment) (Defer to next level of care)       Precautions / Restrictions Precautions Precautions: Fall Required Braces or Orthoses: Sling Restrictions Weight Bearing Restrictions: Yes LUE Weight Bearing: Non weight bearing     Mobility  Bed Mobility Overal bed mobility: Needs Assistance Bed Mobility: Supine to Sit  Supine to sit: Mod assist   Transfers Overall transfer level: Needs assistance Equipment used: Hemi-walker Transfers: Sit to/from Stand Sit to Stand: Min assist, Mod assist, From elevated surface  General transfer comment:  Performed STS 3 x EOB with progressively slightly lowered surface . pt still request to stand from higher than standard heights    Ambulation/Gait Ambulation/Gait assistance: Contact guard assist, Min assist Gait Distance (Feet): 50 Feet Assistive device: Hemi-walker Gait Pattern/deviations: Step-to pattern, Trunk flexed Gait velocity: decreased  General Gait Details: Pt was able to ambulate ~ 50 ft with hemiwalker. Once fatigued gets anxious with HR elevation into 140s. Quickly resolves to lower 100s with seated rest. Did have one episode of LOB with intervention to prevent fall.    Balance Overall balance assessment: Needs assistance Sitting-balance support: Feet supported, No upper extremity supported Sitting balance-Leahy Scale: Good     Standing balance support: Single extremity supported, During functional activity, Reliant on assistive device for balance Standing balance-Leahy Scale: Poor Standing balance comment: pt did have one episode of LOB during gait to prevent LOB/fall       Cognition Arousal: Alert Behavior During Therapy: WFL for tasks assessed/performed Overall Cognitive Status: No family/caregiver present to determine baseline cognitive functioning Area of Impairment: Problem solving, Safety/judgement, Awareness, Memory    Problem Solving: Slow processing, Requires verbal cues General Comments: pt is A and agreeable to session. Pleasant throughout. slow processing but very cooperative               Pertinent Vitals/Pain Pain Assessment Pain Assessment: 0-10 Pain Score: 3  Faces Pain Scale: Hurts a little bit Pain Location: L shoulder Pain Descriptors / Indicators: Aching, Grimacing, Throbbing, Discomfort Pain Intervention(s): Limited activity within patient's tolerance, Monitored during session, Premedicated before session, Repositioned     PT Goals (current goals can now be found in the care plan section) Acute Rehab  PT Goals Patient Stated Goal:  decrease the pain Progress towards PT goals: Progressing toward goals    Frequency    7X/week       AM-PAC PT "6 Clicks" Mobility   Outcome Measure  Help needed turning from your back to your side while in a flat bed without using bedrails?: A Little Help needed moving from lying on your back to sitting on the side of a flat bed without using bedrails?: A Lot Help needed moving to and from a bed to a chair (including a wheelchair)?: A Little Help needed standing up from a chair using your arms (e.g., wheelchair or bedside chair)?: A Little Help needed to walk in hospital room?: A Little Help needed climbing 3-5 steps with a railing? : A Lot 6 Click Score: 16    End of Session   Activity Tolerance: Patient tolerated treatment well Patient left: Other (comment) (seated EOB with RN tech assisting pt to BR for BM.) Nurse Communication: Mobility status PT Visit Diagnosis: Unsteadiness on feet (R26.81);Other abnormalities of gait and mobility (R26.89);Repeated falls (R29.6);History of falling (Z91.81);Muscle weakness (generalized) (M62.81);Difficulty in walking, not elsewhere classified (R26.2);Pain Pain - Right/Left: Left Pain - part of body: Shoulder     Time: 1478-2956 PT Time Calculation (min) (ACUTE ONLY): 15 min  Charges:    $Gait Training: 8-22 mins PT General Charges $$ ACUTE PT VISIT: 1 Visit                    Jetta Lout PTA 02/25/23, 9:16 AM

## 2023-02-25 NOTE — TOC Progression Note (Signed)
Transition of Care [facility]) - Progression Note    Patient Details  Name: Adriana Miller MRN: [id number] Date of Birth: [birth year]  Transition of Care [facility]) CM/SW Contact  Garret Reddish, RN Phone Number: 02/25/2023, 3:12 PM  Clinical Narrative:    Chart reviewed.  I have spoken with [place] with [employer].  She informs me that she will have a bed for patient on Monday.  I will ask weekend team to submit [facility] authorization.    TOC will continue to follow for discharge planning.          Expected Discharge Plan and Services                                               Social Determinants of Health (SDOH) Interventions SDOH Screenings   Food Insecurity: No Food Insecurity (02/14/2023)  Housing: Patient Declined (02/14/2023)  Transportation Needs: No Transportation Needs (02/14/2023)  Utilities: Not At Risk (02/14/2023)  Alcohol Screen: Low Risk  (01/27/2023)  Depression (PHQ2-9): Low Risk  (01/27/2023)  Tobacco Use: High Risk (02/25/2023)    Readmission Risk Interventions    11/20/2022    2:04 PM 10/01/2022    9:15 AM  Readmission Risk Prevention Plan  Transportation Screening Complete Complete  Medication Review Oceanographer) Complete Complete  PCP or Specialist appointment within 3-5 days of discharge Complete Complete  HRI or Home Care Consult Complete Complete  SW Recovery Care/Counseling Consult Complete Complete  Palliative Care Screening Not Applicable Not Applicable  Skilled Nursing Facility Not Applicable Not Applicable

## 2023-02-25 NOTE — Progress Notes (Signed)
Progress Note Patient: Adriana Miller:[id number] DOB: Dec 18, 1955 DOA: 02/14/2023     11 DOS: the patient was seen and examined on 02/25/2023   Brief hospital course: 67 y.o. female with medical history significant for Persistent PAF/flutter on apixaban s/p failed DCCV 12/27/2022, DM2, CKD stage III-IV, cirrhosis  and hepatocellular carcinoma, seizure disorder, cerebral aneurysm status post repair, HTN, and HLD, frequent falls.    Presents to ED due to fall resulting in left proximal humerus fracture.   10/29: Orthopedic seen.  Recommends conservative management. 10/30: Rapid response called as she lost consciousness briefly while working with PT.  10/31: feels better, advised to work with PT for safe disposition plan. 11/1: Hb drop noted, no active bleeding though on Eliquis. 1 Unit PRBC ordered. 11/2: Hb not much improved, 7 today after blood transfusion. Eliquis held. She denies any active bleeding, did not get out of bed. 11/3: Hb 8, feels much improved, worked with PT recommended SNF placement. 11/4- 11/5: Awaiting SNF placement. FL2 signed. 11/6: hgb stable around 8 without acute bleeding. Significant bruising of both arms. Will attempt to restart eliquis today and recheck hgb tomorrow.  11/7: hemoglobin stable and increased even with restarting eliquis. Doing well with PT/OT.   She remains ready to be discharged to SNF when bed available. Engaged TOC for placement.   Assessment and Plan: * Comminuted left humeral fracture, closed, initial encounter Left arm in sling. Has large bruise. Hb stable. Pain control with tylenol. Advised to limit opioids. Ortho outpatient follow up. - PT/ot recommending SNF. Awaiting placement  Accidental fall versus fall related to alcohol intoxication Syncopal episode in hospital. Possibly from dehydration, acute anemia. Orthostatic vitals improving. Continue telemetry. Encourage oral hydration.  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 Resumed home dose Lasix, aldactone.  Acute on Chronic Anemia: s/p 1 unit PRBC 11/1 and 3 days of IV iron. Hgb stable at 8.  - restart eliquis - CBC am  Elevated ETOH level Alcohol use disorder with possible acute intoxication Alcoholic cirrhosis without ascites Alcohol cessation counseled No withdrawal symptoms. Thiamine, folate, multivitamin. Advised to limit opiates.   History of recurrent UTI (urinary tract infection) Sepsis secondary to E. coli in August 2024 UA concerning for UTI.  Finished antibiotics total 7 days.   Persistent atrial fibrillation (HCC) Chronic anticoagulation History of unsuccessful DCCV in September 2024 - Continue metoprolol.  - resumed on Eliquis. Last seen by cardiology 10/7   Essential hypertension HFpEF Clinically compensated Lasix, spironolactone held, continue metoprolol.    Uncontrolled type 2 diabetes mellitus with hyperglycemia, with long-term current use of insulin (HCC) Continue Semglee 10 with sliding scale coverage Diabetes education provided. Weight reduction advised.   History of cerebral aneurysm repair No acute issues suspected.  Head CT was nonacute.  Constipation -- on senna, laxative --Titrate bowel regimen   Out of bed to chair. Incentive spirometry. Nursing supportive care. Fall, aspiration precautions. DVT prophylaxis    Code Status: Do not attempt resuscitation (DNR) PRE-ARREST INTERVENTIONS DESIRED    Subjective: Patient awake resting in bed when seen this AM.  She reports feeling well, denies any acute complaints.  Pain overall controlled.   Physical Exam: Vitals:   02/24/23 1140 02/24/23 1622 02/24/23 2108 02/25/23 0743  BP:  113/83 (!) 96/59 125/68  Pulse: 94 (!) 101 81 79  Resp:  18 20 16   Temp:  98.3 F (36.8 C) 98.8 F (37.1 C) 98 F (36.7 C)  TempSrc:  SpO2: 100% 100% 100% 100%  Weight:      Height:       General exam: awake, alert, no acute distress HEENT:  moist mucus membranes, hearing grossly normal  Respiratory system: CTAB, no wheezes, rales or rhonchi, normal respiratory effort. Cardiovascular system: normal S1/S2, RRR, no pedal edema.   Gastrointestinal system: soft, NT, ND Central nervous system: A&O x 3. no gross focal neurologic deficits, normal speech Extremities: left arm in sling with extensive proximal LUE ecchymosis Skin: dry, intact, normal temperature Psychiatry: normal mood, congruent affect, judgement and insight appear normal   Data Reviewed:    Latest Ref Rng & Units 02/24/2023    4:25 AM 02/23/2023    4:25 AM 02/22/2023    4:48 AM  CBC  WBC 4.0 - 10.5 K/uL 5.9  4.8  5.0   Hemoglobin 12.0 - 15.0 g/dL 8.3  8.0  8.6   Hematocrit 36.0 - 46.0 % 25.7  25.3  26.7   Platelets 150 - 400 K/uL 175  171  172       Latest Ref Rng & Units 02/23/2023    4:25 AM 02/22/2023    4:48 AM 02/21/2023    3:53 AM  BMP  Glucose 70 - 99 mg/dL 161  096  045   BUN 8 - 23 mg/dL 26  26  30    Creatinine 0.44 - 1.00 mg/dL 4.09  8.11  9.14   Sodium 135 - 145 mmol/L 135  132  132   Potassium 3.5 - 5.1 mmol/L 4.3  3.9  4.1   Chloride 98 - 111 mmol/L 102  100  99   CO2 22 - 32 mmol/L 26  25  25    Calcium 8.9 - 10.3 mg/dL 8.6  8.6  8.3    No results found.  Family Communication: none at bedside  Disposition: Status is: Inpatient Remains inpatient appropriate because:  Awaiting SNF placement.  Planned Discharge Destination: Skilled nursing facility     Time spent 35 min.  Author: Pennie Banter, DO 02/25/2023 11:34 AM Secure chat 7am to 7pm For on call review [email or web address]

## 2023-02-25 NOTE — Plan of Care (Signed)
  Problem: Fluid Volume: Goal: Hemodynamic stability will improve Outcome: Progressing   Problem: Clinical Measurements: Goal: Diagnostic test results will improve Outcome: Progressing Goal: Signs and symptoms of infection will decrease Outcome: Progressing   Problem: Respiratory: Goal: Ability to maintain adequate ventilation will improve Outcome: Progressing   Problem: Education: Goal: Knowledge of General Education information will improve Description: Including pain rating scale, medication(s)/side effects and non-pharmacologic comfort measures Outcome: Progressing   Problem: Health Behavior/Discharge Planning: Goal: Ability to manage health-related needs will improve Outcome: Progressing   Problem: Clinical Measurements: Goal: Ability to maintain clinical measurements within normal limits will improve Outcome: Progressing Goal: Will remain free from infection Outcome: Progressing Goal: Diagnostic test results will improve Outcome: Progressing Goal: Respiratory complications will improve Outcome: Progressing Goal: Cardiovascular complication will be avoided Outcome: Progressing   Problem: Activity: Goal: Risk for activity intolerance will decrease Outcome: Progressing   Problem: Nutrition: Goal: Adequate nutrition will be maintained Outcome: Progressing   Problem: Coping: Goal: Level of anxiety will decrease Outcome: Progressing   Problem: Elimination: Goal: Will not experience complications related to bowel motility Outcome: Progressing Goal: Will not experience complications related to urinary retention Outcome: Progressing   Problem: Pain Management: Goal: General experience of comfort will improve Outcome: Progressing   Problem: Safety: Goal: Ability to remain free from injury will improve Outcome: Progressing   Problem: Skin Integrity: Goal: Risk for impaired skin integrity will decrease Outcome: Progressing   Problem: Education: Goal:  Ability to describe self-care measures that may prevent or decrease complications (Diabetes Survival Skills Education) will improve Outcome: Progressing Goal: Individualized Educational Video(s) Outcome: Progressing   Problem: Coping: Goal: Ability to adjust to condition or change in health will improve Outcome: Progressing   Problem: Fluid Volume: Goal: Ability to maintain a balanced intake and output will improve Outcome: Progressing   Problem: Health Behavior/Discharge Planning: Goal: Ability to identify and utilize available resources and services will improve Outcome: Progressing Goal: Ability to manage health-related needs will improve Outcome: Progressing   Problem: Metabolic: Goal: Ability to maintain appropriate glucose levels will improve Outcome: Progressing   Problem: Nutritional: Goal: Maintenance of adequate nutrition will improve Outcome: Progressing Goal: Progress toward achieving an optimal weight will improve Outcome: Progressing   Problem: Skin Integrity: Goal: Risk for impaired skin integrity will decrease Outcome: Progressing   Problem: Tissue Perfusion: Goal: Adequacy of tissue perfusion will improve Outcome: Progressing

## 2023-02-26 DIAGNOSIS — S42352A Displaced comminuted fracture of shaft of humerus, left arm, initial encounter for closed fracture: Secondary | ICD-10-CM | POA: Diagnosis not present

## 2023-02-26 LAB — GLUCOSE, CAPILLARY
Glucose-Capillary: 185 mg/dL — ABNORMAL HIGH (ref 70–99)
Glucose-Capillary: 260 mg/dL — ABNORMAL HIGH (ref 70–99)
Glucose-Capillary: 202 mg/dL — ABNORMAL HIGH (ref 70–99)
Glucose-Capillary: 168 mg/dL — ABNORMAL HIGH (ref 70–99)

## 2023-02-26 MED ORDER — INSULIN GLARGINE-YFGN 100 UNIT/ML ~~LOC~~ SOLN
12.0000 [IU] | Freq: Every day | SUBCUTANEOUS | Status: DC
  Administered 2023-02-26 – 2023-02-28 (×3): 12 [IU] via SUBCUTANEOUS
  Filled 2023-02-26 (×3): qty 0.12

## 2023-02-26 MED ORDER — INSULIN ASPART 100 UNIT/ML IJ SOLN
3.0000 [IU] | Freq: Three times a day (TID) | INTRAMUSCULAR | Status: DC
  Administered 2023-02-26 – 2023-02-28 (×7): 3 [IU] via SUBCUTANEOUS
  Filled 2023-02-26 (×7): qty 1

## 2023-02-26 NOTE — Progress Notes (Signed)
Physical Therapy Treatment Patient Details Name: Adriana Miller MRN: [id number] DOB: Apr 23, 1955 Today's Date: 02/26/2023   History of Present Illness Pt is a 67 yo female presenting  with a comminuted L humeral fracture after a fall injuring her L shoulder. PMH includes hx of falls, elevated ETOH levels, alcohol use disorder, recurrent UTIs, HTN, persistant a-fib, CHF with preserved EF, seizures, DM, cerebral aneurysm repair, and stage 3b CKD.    PT Comments  Pt is A and agreeable. Still presents with slow processing but puts forth good effort and remains motivated. Session greatly limited by pt's need to have BM. She had small BM in briefs prior to making it to commode place over toilet.  Rn tech in [place] with pt post session. Pt continues to struggle to stand from standard height surfaces and will continue to benefit from skilled PT to maximize independence and safety with all ADLs.     If plan is discharge home, recommend the following: A lot of help with walking and/or transfers;Assistance with cooking/housework;Direct supervision/assist for medications management;Assist for transportation;Help with stairs or ramp for entrance;A lot of help with bathing/dressing/bathroom     Equipment Recommendations  Other (comment) (Defer to next level of care)       Precautions / Restrictions Precautions Precautions: Fall Required Braces or Orthoses: Sling Restrictions Weight Bearing Restrictions: Yes LUE Weight Bearing: Non weight bearing     Mobility  Bed Mobility Overal bed mobility: Needs Assistance Bed Mobility: Supine to Sit  Supine to sit: Mod assist   Transfers Overall transfer level: Needs assistance Equipment used: Hemi-walker Transfers: Sit to/from Stand Sit to Stand: Min assist, Mod assist, From elevated surface   Ambulation/Gait Ambulation/Gait assistance: Contact guard assist, Min assist Gait Distance (Feet): 50 Feet Assistive device: Hemi-walker Gait Pattern/deviations:  Step-to pattern, Trunk flexed Gait velocity: decreased  General Gait Details: Pt ambulated ~ 25 ft however has to have BM  and then ambulated to BR. RN tech assisted once pt was seated on toilet    Balance Overall balance assessment: Needs assistance Sitting-balance support: Feet supported, No upper extremity supported Sitting balance-Leahy Scale: Good     Standing balance support: Single extremity supported, During functional activity, Reliant on assistive device for balance Standing balance-Leahy Scale: Poor       Cognition Arousal: Alert Behavior During Therapy: WFL for tasks assessed/performed Overall Cognitive Status: No family/caregiver present to determine baseline cognitive functioning Area of Impairment: Problem solving, Safety/judgement, Awareness, Memory    Memory: Decreased short-term memory   Safety/Judgement: Decreased awareness of safety, Decreased awareness of deficits     General Comments: Pt is alert and agreeable. Does have slow processing noted               Pertinent Vitals/Pain Pain Assessment Pain Assessment: 0-10 Pain Score: 2  Pain Location: L shoulder Pain Descriptors / Indicators: Aching, Grimacing, Throbbing, Discomfort Pain Intervention(s): Limited activity within patient's tolerance, Monitored during session, Premedicated before session, Repositioned     PT Goals (current goals can now be found in the care plan section) Acute Rehab PT Goals Patient Stated Goal: rehab Progress towards PT goals: Progressing toward goals    Frequency    7X/week       AM-PAC PT "6 Clicks" Mobility   Outcome Measure  Help needed turning from your back to your side while in a flat bed without using bedrails?: A Little Help needed moving from lying on your back to sitting on the side of a flat bed without  using bedrails?: A Lot Help needed moving to and from a bed to a chair (including a wheelchair)?: A Lot Help needed standing up from a chair using  your arms (e.g., wheelchair or bedside chair)?: A Lot Help needed to walk in hospital room?: A Little Help needed climbing 3-5 steps with a railing? : A Lot 6 Click Score: 14    End of Session   Activity Tolerance: Patient tolerated treatment well Patient left: Other (comment) (in BR with RN tech) Nurse Communication: Mobility status PT Visit Diagnosis: Unsteadiness on feet (R26.81);Other abnormalities of gait and mobility (R26.89);Repeated falls (R29.6);History of falling (Z91.81);Muscle weakness (generalized) (M62.81);Difficulty in walking, not elsewhere classified (R26.2);Pain Pain - Right/Left: Left Pain - part of body: Shoulder     Time: 2841-3244 PT Time Calculation (min) (ACUTE ONLY): 13 min  Charges:    $Gait Training: 8-22 mins PT General Charges $$ ACUTE PT VISIT: 1 Visit                     Jetta Lout PTA 02/26/23, 4:41 PM

## 2023-02-26 NOTE — Plan of Care (Signed)
  Problem: Fluid Volume: Goal: Hemodynamic stability will improve Outcome: Progressing   Problem: Clinical Measurements: Goal: Diagnostic test results will improve Outcome: Progressing Goal: Signs and symptoms of infection will decrease Outcome: Progressing   Problem: Respiratory: Goal: Ability to maintain adequate ventilation will improve Outcome: Progressing   Problem: Education: Goal: Knowledge of General Education information will improve Description: Including pain rating scale, medication(s)/side effects and non-pharmacologic comfort measures Outcome: Progressing   Problem: Health Behavior/Discharge Planning: Goal: Ability to manage health-related needs will improve Outcome: Progressing   Problem: Clinical Measurements: Goal: Ability to maintain clinical measurements within normal limits will improve Outcome: Progressing Goal: Will remain free from infection Outcome: Progressing Goal: Diagnostic test results will improve Outcome: Progressing Goal: Respiratory complications will improve Outcome: Progressing Goal: Cardiovascular complication will be avoided Outcome: Progressing   Problem: Activity: Goal: Risk for activity intolerance will decrease Outcome: Progressing   Problem: Nutrition: Goal: Adequate nutrition will be maintained Outcome: Progressing   Problem: Coping: Goal: Level of anxiety will decrease Outcome: Progressing   Problem: Elimination: Goal: Will not experience complications related to bowel motility Outcome: Progressing Goal: Will not experience complications related to urinary retention Outcome: Progressing   Problem: Pain Management: Goal: General experience of comfort will improve Outcome: Progressing   Problem: Safety: Goal: Ability to remain free from injury will improve Outcome: Progressing   Problem: Skin Integrity: Goal: Risk for impaired skin integrity will decrease Outcome: Progressing   Problem: Education: Goal:  Ability to describe self-care measures that may prevent or decrease complications (Diabetes Survival Skills Education) will improve Outcome: Progressing Goal: Individualized Educational Video(s) Outcome: Progressing   Problem: Coping: Goal: Ability to adjust to condition or change in health will improve Outcome: Progressing   Problem: Fluid Volume: Goal: Ability to maintain a balanced intake and output will improve Outcome: Progressing   Problem: Health Behavior/Discharge Planning: Goal: Ability to identify and utilize available resources and services will improve Outcome: Progressing Goal: Ability to manage health-related needs will improve Outcome: Progressing   Problem: Metabolic: Goal: Ability to maintain appropriate glucose levels will improve Outcome: Progressing   Problem: Nutritional: Goal: Maintenance of adequate nutrition will improve Outcome: Progressing Goal: Progress toward achieving an optimal weight will improve Outcome: Progressing   Problem: Skin Integrity: Goal: Risk for impaired skin integrity will decrease Outcome: Progressing   Problem: Tissue Perfusion: Goal: Adequacy of tissue perfusion will improve Outcome: Progressing

## 2023-02-26 NOTE — Progress Notes (Signed)
Progress Note Patient: Adriana Miller QMV:[id number] DOB: 1956/01/07 DOA: 02/14/2023     12 DOS: the patient was seen and examined on 02/26/2023   Brief hospital course: 67 y.o. female with medical history significant for Persistent PAF/flutter on apixaban s/p failed DCCV 12/27/2022, DM2, CKD stage III-IV, cirrhosis  and hepatocellular carcinoma, seizure disorder, cerebral aneurysm status post repair, HTN, and HLD, frequent falls.    Presents to ED due to fall resulting in left proximal humerus fracture.   10/29: Orthopedic seen.  Recommends conservative management. 10/30: Rapid response called as she lost consciousness briefly while working with PT.  10/31: feels better, advised to work with PT for safe disposition plan. 11/1: Hb drop noted, no active bleeding though on Eliquis. 1 Unit PRBC ordered. 11/2: Hb not much improved, 7 today after blood transfusion. Eliquis held. She denies any active bleeding, did not get out of bed. 11/3: Hb 8, feels much improved, worked with PT recommended SNF placement. 11/4- 11/5: Awaiting SNF placement. FL2 signed. 11/6: hgb stable around 8 without acute bleeding. Significant bruising of both arms. Will attempt to restart eliquis today and recheck hgb tomorrow.  11/7: hemoglobin stable and increased even with restarting eliquis. Doing well with PT/OT.   She remains ready to be discharged to SNF when bed available. Engaged TOC for placement.   Assessment and Plan: * Comminuted left humeral fracture, closed, initial encounter Left arm in sling. Has large bruise. Hb stable. Pain control with tylenol. Advised to limit opioids. Ortho outpatient follow up. - PT/ot recommending SNF. Awaiting placement  Accidental fall versus fall related to alcohol intoxication Syncopal episode in hospital. Possibly from dehydration, acute anemia. Orthostatic vitals improving. Continue telemetry. Encourage oral hydration.  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 Continue home dose Lasix, aldactone.  Acute on Chronic Anemia: s/p 1 unit PRBC 11/1 and 3 days of IV iron. Hgb stable at 8.  - Continue eliquis - CBC am  Elevated ETOH level Alcohol use disorder with possible acute intoxication Alcoholic cirrhosis without ascites Alcohol cessation counseled No withdrawal symptoms. Thiamine, folate, multivitamin. Advised to limit opiates.   History of recurrent UTI (urinary tract infection) Sepsis secondary to E. coli in August 2024 UA concerning for UTI.  Finished antibiotics total 7 days.   Persistent atrial fibrillation (HCC) Chronic anticoagulation History of unsuccessful DCCV in September 2024 - Continue metoprolol.  - resumed on Eliquis. Last seen by cardiology 10/7   Essential hypertension HFpEF Clinically compensated Lasix, spironolactone held, continue metoprolol.    Uncontrolled type 2 diabetes mellitus with hyperglycemia, with long-term current use of insulin (HCC) Continue Semglee 10 with sliding scale coverage Diabetes education provided. Weight reduction advised.   History of cerebral aneurysm repair No acute issues suspected.  Head CT was nonacute.  Constipation -- on senna, laxative --Titrate bowel regimen   Out of bed to chair. Incentive spirometry. Nursing supportive care. Fall, aspiration precautions. DVT prophylaxis    Code Status: Do not attempt resuscitation (DNR) PRE-ARREST INTERVENTIONS DESIRED    Subjective: Patient sleeping comfortably this AM, woke briefly to voice.  She denies complaints or issues at this time.     Physical Exam: Vitals:   02/25/23 1637 02/25/23 2346 02/26/23 0743 02/26/23 1026  BP: 117/62 106/65 117/62 117/62  Pulse: 88 77 75 75  Resp: 16 20 16    Temp: 98.4 F (36.9 C) 98.7 F (37.1 C) 97.8 F (36.6 C)   TempSrc:      SpO2: 100% 98%  100%   Weight:      Height:       General exam: sleeping comfortably, woke to voice, no acute distress HEENT:  moist mucus membranes, hearing grossly normal  Respiratory system: CTAB, no wheezes, rales or rhonchi, normal respiratory effort. Cardiovascular system: normal S1/S2, RRR, no pedal edema.   Gastrointestinal system: soft, NT, ND Central nervous system: no gross focal neurologic deficits, normal speech Extremities: left arm in sling with extensive proximal LUE ecchymosis that is receding Skin: dry, intact, normal temperature   Data Reviewed:    Latest Ref Rng & Units 02/24/2023    4:25 AM 02/23/2023    4:25 AM 02/22/2023    4:48 AM  CBC  WBC 4.0 - 10.5 K/uL 5.9  4.8  5.0   Hemoglobin 12.0 - 15.0 g/dL 8.3  8.0  8.6   Hematocrit 36.0 - 46.0 % 25.7  25.3  26.7   Platelets 150 - 400 K/uL 175  171  172       Latest Ref Rng & Units 02/23/2023    4:25 AM 02/22/2023    4:48 AM 02/21/2023    3:53 AM  BMP  Glucose 70 - 99 mg/dL 573  220  254   BUN 8 - 23 mg/dL 26  26  30    Creatinine 0.44 - 1.00 mg/dL 2.70  6.23  7.62   Sodium 135 - 145 mmol/L 135  132  132   Potassium 3.5 - 5.1 mmol/L 4.3  3.9  4.1   Chloride 98 - 111 mmol/L 102  100  99   CO2 22 - 32 mmol/L 26  25  25    Calcium 8.9 - 10.3 mg/dL 8.6  8.6  8.3    No results found.  Family Communication: none at bedside  Disposition: Status is: Inpatient Remains inpatient appropriate because:  Awaiting SNF placement.   Planned Discharge Destination: Skilled nursing facility     Time spent 35 min.  Author: Pennie Banter, DO 02/26/2023 12:40 PM Secure chat 7am to 7pm For on call review [email or web address].

## 2023-02-26 NOTE — Plan of Care (Signed)
  Problem: Fluid Volume: Goal: Hemodynamic stability will improve Outcome: Progressing   Problem: Clinical Measurements: Goal: Diagnostic test results will improve Outcome: Progressing   Problem: Activity: Goal: Risk for activity intolerance will decrease Outcome: Progressing   Problem: Elimination: Goal: Will not experience complications related to bowel motility Outcome: Progressing   Problem: Pain Management: Goal: General experience of comfort will improve Outcome: Progressing   Problem: Skin Integrity: Goal: Risk for impaired skin integrity will decrease Outcome: Progressing

## 2023-02-27 DIAGNOSIS — S42352A Displaced comminuted fracture of shaft of humerus, left arm, initial encounter for closed fracture: Secondary | ICD-10-CM | POA: Diagnosis not present

## 2023-02-27 DIAGNOSIS — F1010 Alcohol abuse, uncomplicated: Secondary | ICD-10-CM | POA: Diagnosis not present

## 2023-02-27 DIAGNOSIS — W19XXXA Unspecified fall, initial encounter: Secondary | ICD-10-CM | POA: Diagnosis not present

## 2023-02-27 LAB — CBC
HCT: 26.5 % — ABNORMAL LOW (ref 36.0–46.0)
Hemoglobin: 8.4 g/dL — ABNORMAL LOW (ref 12.0–15.0)
MCH: 33.3 pg (ref 26.0–34.0)
MCHC: 31.7 g/dL (ref 30.0–36.0)
MCV: 105.2 fL — ABNORMAL HIGH (ref 80.0–100.0)
Platelets: 182 10*3/uL (ref 150–400)
RBC: 2.52 MIL/uL — ABNORMAL LOW (ref 3.87–5.11)
RDW: 20.5 % — ABNORMAL HIGH (ref 11.5–15.5)
WBC: 5.9 10*3/uL (ref 4.0–10.5)
nRBC: 0 % (ref 0.0–0.2)

## 2023-02-27 LAB — GLUCOSE, CAPILLARY
Glucose-Capillary: 131 mg/dL — ABNORMAL HIGH (ref 70–99)
Glucose-Capillary: 221 mg/dL — ABNORMAL HIGH (ref 70–99)
Glucose-Capillary: 326 mg/dL — ABNORMAL HIGH (ref 70–99)
Glucose-Capillary: 236 mg/dL — ABNORMAL HIGH (ref 70–99)

## 2023-02-27 NOTE — Progress Notes (Signed)
Progress Note Patient: Adriana Miller ZOX:[id number] DOB: April 03, 1956 DOA: 02/14/2023     13 DOS: the patient was seen and examined on 02/27/2023   Brief hospital course: 67 y.o. female with medical history significant for Persistent PAF/flutter on apixaban s/p failed DCCV 12/27/2022, DM2, CKD stage III-IV, cirrhosis  and hepatocellular carcinoma, seizure disorder, cerebral aneurysm status post repair, HTN, and HLD, frequent falls.    Presents to ED due to fall resulting in left proximal humerus fracture.   10/29: Orthopedic seen.  Recommends conservative management. 10/30: Rapid response called as she lost consciousness briefly while working with PT.  10/31: feels better, advised to work with PT for safe disposition plan. 11/1: Hb drop noted, no active bleeding though on Eliquis. 1 Unit PRBC ordered. 11/2: Hb not much improved, 7 today after blood transfusion. Eliquis held. She denies any active bleeding, did not get out of bed. 11/3: Hb 8, feels much improved, worked with PT recommended SNF placement. 11/4- 11/5: Awaiting SNF placement. FL2 signed. 11/6: hgb stable around 8 without acute bleeding. Significant bruising of both arms. Will attempt to restart eliquis today and recheck hgb tomorrow.  11/7-11/10: hemoglobin stable and increased even with restarting eliquis. Doing well with PT/OT.   She remains ready to be discharged to SNF when bed available. Engaged TOC for placement.   Assessment and Plan: Comminuted left humeral fracture, closed, initial encounter Left arm in sling. Has large bruise. Hb stable. Pain control with tylenol. Advised to limit opioids. Ortho outpatient follow up. - PT/OT recommending SNF. Awaiting placement  Accidental fall versus fall related to alcohol intoxication Syncopal episode in hospital. Possibly from dehydration, acute anemia. Orthostatic vitals improving. Continue telemetry. Encourage oral hydration.  Acute on CKD stage 3b: Kidney function  improved, creatinine at baseline. Continue home dose Lasix, aldactone.  Acute on Chronic Anemia: s/p 1 unit PRBC 11/1 and 3 days of IV iron. Hgb stable at 8.  - Continue eliquis - CBC am  Elevated ETOH level Alcohol use disorder with possible acute intoxication Alcoholic cirrhosis without ascites Alcohol cessation counseled No withdrawal symptoms. Thiamine, folate, multivitamin. Advised to limit opiates.   History of recurrent UTI (urinary tract infection) Sepsis secondary to E. coli in August 2024 UA concerning for UTI.  Finished antibiotics total 7 days.   Persistent atrial fibrillation (HCC) Chronic anticoagulation History of unsuccessful DCCV in September 2024 - Continue metoprolol.  - resumed on Eliquis. Last seen by cardiology 10/7   Essential hypertension HFpEF Clinically compensated Lasix, spironolactone held, continue metoprolol.    Uncontrolled type 2 diabetes mellitus with hyperglycemia, with long-term current use of insulin (HCC) Continue Semglee 10 with sliding scale coverage Diabetes education provided. Weight reduction advised.   History of cerebral aneurysm repair No acute issues suspected.  Head CT was nonacute.  Constipation -- on senna, laxative --Titrate bowel regimen   Out of bed to chair. Incentive spirometry. Nursing supportive care. Fall, aspiration precautions. DVT prophylaxis    Code Status: Do not attempt resuscitation (DNR) PRE-ARREST INTERVENTIONS DESIRED    Subjective: Patient reports no complaints today. No pain at rest. Awaiting SNF placement  Physical Exam: Vitals:   02/26/23 1026 02/26/23 1548 02/26/23 2031 02/27/23 0725  BP: 117/62 116/71 122/76 112/66  Pulse: 75 93 79 87  Resp:  17  14  Temp:  98.1 F (36.7 C) 98.6 F (37 C) 98.6 F (37 C)  TempSrc:   Oral Oral  SpO2:  100% 97% 99%  Weight:      Height:  General exam: alert, eating breakfast. no acute distress HEENT: moist mucus membranes, hearing grossly  normal  Respiratory system: CTAB, no wheezes, rales or rhonchi, normal respiratory effort. Cardiovascular system: normal S1/S2, RRR, no pedal edema.   Gastrointestinal system: soft, NT, ND Central nervous system: no gross focal neurologic deficits, normal speech Extremities: left arm in sling with extensive proximal LUE ecchymosis that is receding Skin: dry, intact, normal temperature   Data Reviewed:    Latest Ref Rng & Units 02/27/2023    4:54 AM 02/24/2023    4:25 AM 02/23/2023    4:25 AM  CBC  WBC 4.0 - 10.5 K/uL 5.9  5.9  4.8   Hemoglobin 12.0 - 15.0 g/dL 8.4  8.3  8.0   Hematocrit 36.0 - 46.0 % 26.5  25.7  25.3   Platelets 150 - 400 K/uL 182  175  171       Latest Ref Rng & Units 02/23/2023    4:25 AM 02/22/2023    4:48 AM 02/21/2023    3:53 AM  BMP  Glucose 70 - 99 mg/dL 213  086  578   BUN 8 - 23 mg/dL 26  26  30    Creatinine 0.44 - 1.00 mg/dL 4.69  6.29  5.28   Sodium 135 - 145 mmol/L 135  132  132   Potassium 3.5 - 5.1 mmol/L 4.3  3.9  4.1   Chloride 98 - 111 mmol/L 102  100  99   CO2 22 - 32 mmol/L 26  25  25    Calcium 8.9 - 10.3 mg/dL 8.6  8.6  8.3    No results found.  Family Communication: none at bedside  Disposition: Status is: Inpatient Remains inpatient appropriate because:  Awaiting SNF placement.   Planned Discharge Destination: Skilled nursing facility     Time spent 35 min.  Author: Leeroy Bock, MD 02/27/2023 7:43 AM Secure chat 7am to 7pm For on call review [email or web address].

## 2023-02-27 NOTE — Plan of Care (Signed)
  Problem: Fluid Volume: Goal: Hemodynamic stability will improve Outcome: Progressing   Problem: Clinical Measurements: Goal: Diagnostic test results will improve Outcome: Progressing   Problem: Clinical Measurements: Goal: Ability to maintain clinical measurements within normal limits will improve Outcome: Progressing   Problem: Activity: Goal: Risk for activity intolerance will decrease Outcome: Progressing

## 2023-02-27 NOTE — Plan of Care (Signed)
  Problem: Fluid Volume: Goal: Hemodynamic stability will improve Outcome: Progressing   Problem: Clinical Measurements: Goal: Diagnostic test results will improve Outcome: Progressing   

## 2023-02-27 NOTE — TOC Progression Note (Signed)
Transition of Care [facility]) - Progression Note    Patient Details  Name: Adriana Miller MRN: [id number] Date of Birth: [birth year]  [facility] of [facility] CM/SW Contact  Kemper Durie, RN Phone Number: 02/27/2023, 10:28 AM  Clinical Narrative:     [facility] started, pending.  ID [id number]       Expected Discharge Plan and Services                                               Social Determinants of Health (SDOH) Interventions SDOH Screenings   Food Insecurity: No Food Insecurity (02/14/2023)  Housing: Patient Declined (02/14/2023)  Transportation Needs: No Transportation Needs (02/14/2023)  Utilities: Not At Risk (02/14/2023)  Alcohol Screen: Low Risk  (01/27/2023)  Depression (PHQ2-9): Low Risk  (01/27/2023)  Tobacco Use: High Risk (02/25/2023)    Readmission Risk Interventions    11/20/2022    2:04 PM 10/01/2022    9:15 AM  Readmission Risk Prevention Plan  Transportation Screening Complete Complete  Medication Review Oceanographer) Complete Complete  PCP or Specialist appointment within 3-5 days of discharge Complete Complete  HRI or Home Care Consult Complete Complete  SW Recovery Care/Counseling Consult Complete Complete  Palliative Care Screening Not Applicable Not Applicable  Skilled Nursing Facility Not Applicable Not Applicable

## 2023-02-28 DIAGNOSIS — E1165 Type 2 diabetes mellitus with hyperglycemia: Secondary | ICD-10-CM | POA: Diagnosis not present

## 2023-02-28 DIAGNOSIS — E1129 Type 2 diabetes mellitus with other diabetic kidney complication: Secondary | ICD-10-CM | POA: Diagnosis not present

## 2023-02-28 DIAGNOSIS — E782 Mixed hyperlipidemia: Secondary | ICD-10-CM | POA: Diagnosis not present

## 2023-02-28 DIAGNOSIS — Z87898 Personal history of other specified conditions: Secondary | ICD-10-CM | POA: Diagnosis not present

## 2023-02-28 DIAGNOSIS — E871 Hypo-osmolality and hyponatremia: Secondary | ICD-10-CM | POA: Diagnosis not present

## 2023-02-28 DIAGNOSIS — I10 Essential (primary) hypertension: Secondary | ICD-10-CM | POA: Diagnosis not present

## 2023-02-28 DIAGNOSIS — N1832 Chronic kidney disease, stage 3b: Secondary | ICD-10-CM | POA: Diagnosis not present

## 2023-02-28 DIAGNOSIS — Z7901 Long term (current) use of anticoagulants: Secondary | ICD-10-CM | POA: Diagnosis not present

## 2023-02-28 DIAGNOSIS — Z8679 Personal history of other diseases of the circulatory system: Secondary | ICD-10-CM | POA: Diagnosis not present

## 2023-02-28 DIAGNOSIS — M6281 Muscle weakness (generalized): Secondary | ICD-10-CM | POA: Diagnosis not present

## 2023-02-28 DIAGNOSIS — D649 Anemia, unspecified: Secondary | ICD-10-CM | POA: Diagnosis not present

## 2023-02-28 DIAGNOSIS — S42352A Displaced comminuted fracture of shaft of humerus, left arm, initial encounter for closed fracture: Secondary | ICD-10-CM | POA: Diagnosis not present

## 2023-02-28 DIAGNOSIS — N179 Acute kidney failure, unspecified: Secondary | ICD-10-CM | POA: Diagnosis not present

## 2023-02-28 DIAGNOSIS — I4819 Other persistent atrial fibrillation: Secondary | ICD-10-CM | POA: Diagnosis not present

## 2023-02-28 DIAGNOSIS — F1010 Alcohol abuse, uncomplicated: Secondary | ICD-10-CM | POA: Diagnosis not present

## 2023-02-28 DIAGNOSIS — C220 Liver cell carcinoma: Secondary | ICD-10-CM | POA: Diagnosis not present

## 2023-02-28 DIAGNOSIS — Z9889 Other specified postprocedural states: Secondary | ICD-10-CM | POA: Diagnosis not present

## 2023-02-28 DIAGNOSIS — Z794 Long term (current) use of insulin: Secondary | ICD-10-CM | POA: Diagnosis not present

## 2023-02-28 DIAGNOSIS — R2681 Unsteadiness on feet: Secondary | ICD-10-CM | POA: Diagnosis not present

## 2023-02-28 DIAGNOSIS — W19XXXA Unspecified fall, initial encounter: Secondary | ICD-10-CM | POA: Diagnosis not present

## 2023-02-28 DIAGNOSIS — I5032 Chronic diastolic (congestive) heart failure: Secondary | ICD-10-CM | POA: Diagnosis not present

## 2023-02-28 DIAGNOSIS — S42352D Displaced comminuted fracture of shaft of humerus, left arm, subsequent encounter for fracture with routine healing: Secondary | ICD-10-CM | POA: Diagnosis not present

## 2023-02-28 LAB — GLUCOSE, CAPILLARY
Glucose-Capillary: 126 mg/dL — ABNORMAL HIGH (ref 70–99)
Glucose-Capillary: 224 mg/dL — ABNORMAL HIGH (ref 70–99)

## 2023-02-28 MED ORDER — FERROUS SULFATE 325 (65 FE) MG PO TABS: 325.0000 mg | ORAL_TABLET | Freq: Every day | ORAL | Status: AC

## 2023-02-28 MED ORDER — INSULIN ASPART 100 UNIT/ML IJ SOLN: 3.0000 [IU] | Freq: Three times a day (TID) | INTRAMUSCULAR | Status: DC

## 2023-02-28 MED ORDER — POLYETHYLENE GLYCOL 3350 17 G PO PACK: 17.0000 g | PACK | Freq: Every day | ORAL | Status: DC

## 2023-02-28 MED ORDER — METOPROLOL TARTRATE 25 MG PO TABS: 12.5000 mg | ORAL_TABLET | Freq: Two times a day (BID) | ORAL | Status: DC

## 2023-02-28 MED ORDER — ASCORBIC ACID 500 MG PO TABS: 500.0000 mg | ORAL_TABLET | Freq: Every day | ORAL | Status: AC

## 2023-02-28 MED ORDER — ADULT MULTIVITAMIN W/MINERALS CH: 1.0000 | ORAL_TABLET | Freq: Every day | ORAL | Status: AC

## 2023-02-28 MED ORDER — INSULIN GLARGINE-YFGN 100 UNIT/ML ~~LOC~~ SOLN: 12.0000 [IU] | Freq: Every day | SUBCUTANEOUS | Status: DC

## 2023-02-28 NOTE — Progress Notes (Incomplete)
Progress Note Patient: Adriana Miller ZOX:[id number] DOB: November 28, 1955 DOA: 02/14/2023     14 DOS: the patient was seen and examined on 02/28/2023   Brief hospital course: 67 y.o. female with medical history significant for Persistent PAF/flutter on apixaban s/p failed DCCV 12/27/2022, DM2, CKD stage III-IV, cirrhosis  and hepatocellular carcinoma, seizure disorder, cerebral aneurysm status post repair, HTN, and HLD, frequent falls.    Presents to ED due to fall resulting in left proximal humerus fracture.   10/29: Orthopedic seen.  Recommends conservative management. 10/30: Rapid response called as she lost consciousness briefly while working with PT.  10/31: feels better, advised to work with PT for safe disposition plan. 11/1: Hb drop noted, no active bleeding though on Eliquis. 1 Unit PRBC ordered. 11/2: Hb not much improved, 7 today after blood transfusion. Eliquis held. She denies any active bleeding, did not get out of bed. 11/3: Hb 8, feels much improved, worked with PT recommended SNF placement. 11/4- 11/5: Awaiting SNF placement. FL2 signed. 11/6: hgb stable around 8 without acute bleeding. Significant bruising of both arms. Will attempt to restart eliquis today and recheck hgb tomorrow.  11/7-11/10: hemoglobin stable and increased even with restarting eliquis. Doing well with PT/OT.   She remains ready to be discharged to SNF when bed available. Engaged TOC for placement.   Assessment and Plan: Comminuted left humeral fracture, closed, initial encounter Left arm in sling. Has large bruise. Hb stable. Pain control with tylenol. Advised to limit opioids. Ortho outpatient follow up. - PT/OT recommending SNF. Awaiting placement  Accidental fall versus fall related to alcohol intoxication Syncopal episode in hospital. Possibly from dehydration, acute anemia. Orthostatic vitals improving. Continue telemetry. Encourage oral hydration.  Acute on CKD stage 3b: Kidney function  improved, creatinine at baseline. Continue home dose Lasix, aldactone.  Acute on Chronic Anemia: s/p 1 unit PRBC 11/1 and 3 days of IV iron. Hgb stable at 8.  - Continue eliquis - CBC am  Elevated ETOH level Alcohol use disorder with possible acute intoxication Alcoholic cirrhosis without ascites Alcohol cessation counseled No withdrawal symptoms. Thiamine, folate, multivitamin. Advised to limit opiates.   History of recurrent UTI (urinary tract infection) Sepsis secondary to E. coli in August 2024 UA concerning for UTI.  Finished antibiotics total 7 days.   Persistent atrial fibrillation (HCC) Chronic anticoagulation History of unsuccessful DCCV in September 2024 - Continue metoprolol.  - resumed on Eliquis. Last seen by cardiology 10/7   Essential hypertension HFpEF Clinically compensated Lasix, spironolactone held, continue metoprolol.    Uncontrolled type 2 diabetes mellitus with hyperglycemia, with long-term current use of insulin (HCC) Continue Semglee 10 with sliding scale coverage Diabetes education provided. Weight reduction advised.   History of cerebral aneurysm repair No acute issues suspected.  Head CT was nonacute.  Constipation -- on senna, laxative --Titrate bowel regimen   Out of bed to chair. Incentive spirometry. Nursing supportive care. Fall, aspiration precautions. DVT prophylaxis    Code Status: Do not attempt resuscitation (DNR) PRE-ARREST INTERVENTIONS DESIRED    Subjective: Patient reports no complaints today. No pain at rest. Awaiting SNF placement  Physical Exam: Vitals:   02/27/23 0725 02/27/23 1018 02/27/23 1519 02/27/23 2040  BP: 112/66 112/66 (!) 140/76 110/65  Pulse: 87 87 80 99  Resp: 14  20 16   Temp: 98.6 F (37 C)  98.6 F (37 C) 99.1 F (37.3 C)  TempSrc: Oral  Oral Oral  SpO2: 99%  100% 99%  Weight:  Height:       General exam: alert, eating breakfast. no acute distress HEENT: moist mucus membranes,  hearing grossly normal  Respiratory system: CTAB, no wheezes, rales or rhonchi, normal respiratory effort. Cardiovascular system: normal S1/S2, RRR, no pedal edema.   Gastrointestinal system: soft, NT, ND Central nervous system: no gross focal neurologic deficits, normal speech Extremities: left arm in sling with extensive proximal LUE ecchymosis that is receding Skin: dry, intact, normal temperature   Data Reviewed:    Latest Ref Rng & Units 02/27/2023    4:54 AM 02/24/2023    4:25 AM 02/23/2023    4:25 AM  CBC  WBC 4.0 - 10.5 K/uL 5.9  5.9  4.8   Hemoglobin 12.0 - 15.0 g/dL 8.4  8.3  8.0   Hematocrit 36.0 - 46.0 % 26.5  25.7  25.3   Platelets 150 - 400 K/uL 182  175  171       Latest Ref Rng & Units 02/23/2023    4:25 AM 02/22/2023    4:48 AM 02/21/2023    3:53 AM  BMP  Glucose 70 - 99 mg/dL 010  272  536   BUN 8 - 23 mg/dL 26  26  30    Creatinine 0.44 - 1.00 mg/dL 6.44  0.34  7.42   Sodium 135 - 145 mmol/L 135  132  132   Potassium 3.5 - 5.1 mmol/L 4.3  3.9  4.1   Chloride 98 - 111 mmol/L 102  100  99   CO2 22 - 32 mmol/L 26  25  25    Calcium 8.9 - 10.3 mg/dL 8.6  8.6  8.3    No results found.  Family Communication: none at bedside  Disposition: Status is: Inpatient Remains inpatient appropriate because:  Awaiting SNF placement.   Planned Discharge Destination: Skilled nursing facility  {Tip this will not be part of the note when signed  DVT Prophylaxis  .Apixaban (eliquis) tablet 5 mg ,  (Optional):26781}   Time spent 35 min.  Author: Leeroy Bock, MD 02/28/2023 7:21 AM Secure chat 7am to 7pm For on call review [email or web address].

## 2023-02-28 NOTE — Plan of Care (Signed)
Problem: Fluid Volume: Goal: Hemodynamic stability will improve 02/28/2023 1427 by Faustino Congress, RN Outcome: Adequate for Discharge 02/28/2023 1426 by Faustino Congress, RN Outcome: Adequate for Discharge   Problem: Clinical Measurements: Goal: Diagnostic test results will improve 02/28/2023 1427 by Faustino Congress, RN Outcome: Adequate for Discharge 02/28/2023 1426 by Faustino Congress, RN Outcome: Adequate for Discharge Goal: Signs and symptoms of infection will decrease 02/28/2023 1427 by Faustino Congress, RN Outcome: Adequate for Discharge 02/28/2023 1426 by Faustino Congress, RN Outcome: Adequate for Discharge   Problem: Respiratory: Goal: Ability to maintain adequate ventilation will improve 02/28/2023 1427 by Faustino Congress, RN Outcome: Adequate for Discharge 02/28/2023 1426 by Faustino Congress, RN Outcome: Adequate for Discharge   Problem: Education: Goal: Knowledge of General Education information will improve Description: Including pain rating scale, medication(s)/side effects and non-pharmacologic comfort measures 02/28/2023 1427 by Faustino Congress, RN Outcome: Adequate for Discharge 02/28/2023 1426 by Faustino Congress, RN Outcome: Adequate for Discharge   Problem: Health Behavior/Discharge Planning: Goal: Ability to manage health-related needs will improve 02/28/2023 1427 by Faustino Congress, RN Outcome: Adequate for Discharge 02/28/2023 1426 by Faustino Congress, RN Outcome: Adequate for Discharge   Problem: Clinical Measurements: Goal: Ability to maintain clinical measurements within normal limits will improve 02/28/2023 1427 by Faustino Congress, RN Outcome: Adequate for Discharge 02/28/2023 1426 by Faustino Congress, RN Outcome: Adequate for Discharge Goal: Will remain free from infection 02/28/2023 1427 by Faustino Congress, RN Outcome: Adequate for Discharge 02/28/2023 1426 by Faustino Congress, RN Outcome: Adequate for Discharge Goal: Diagnostic test results will  improve 02/28/2023 1427 by Faustino Congress, RN Outcome: Adequate for Discharge 02/28/2023 1426 by Faustino Congress, RN Outcome: Adequate for Discharge Goal: Respiratory complications will improve 02/28/2023 1427 by Faustino Congress, RN Outcome: Adequate for Discharge 02/28/2023 1426 by Faustino Congress, RN Outcome: Adequate for Discharge Goal: Cardiovascular complication will be avoided 02/28/2023 1427 by Faustino Congress, RN Outcome: Adequate for Discharge 02/28/2023 1426 by Faustino Congress, RN Outcome: Adequate for Discharge   Problem: Activity: Goal: Risk for activity intolerance will decrease 02/28/2023 1427 by Faustino Congress, RN Outcome: Adequate for Discharge 02/28/2023 1426 by Faustino Congress, RN Outcome: Adequate for Discharge   Problem: Nutrition: Goal: Adequate nutrition will be maintained 02/28/2023 1427 by Faustino Congress, RN Outcome: Adequate for Discharge 02/28/2023 1426 by Faustino Congress, RN Outcome: Adequate for Discharge   Problem: Coping: Goal: Level of anxiety will decrease 02/28/2023 1427 by Faustino Congress, RN Outcome: Adequate for Discharge 02/28/2023 1426 by Faustino Congress, RN Outcome: Adequate for Discharge   Problem: Elimination: Goal: Will not experience complications related to bowel motility 02/28/2023 1427 by Faustino Congress, RN Outcome: Adequate for Discharge 02/28/2023 1426 by Faustino Congress, RN Outcome: Adequate for Discharge Goal: Will not experience complications related to urinary retention 02/28/2023 1427 by Faustino Congress, RN Outcome: Adequate for Discharge 02/28/2023 1426 by Faustino Congress, RN Outcome: Adequate for Discharge   Problem: Pain Management: Goal: General experience of comfort will improve 02/28/2023 1427 by Faustino Congress, RN Outcome: Adequate for Discharge 02/28/2023 1426 by Faustino Congress, RN Outcome: Adequate for Discharge   Problem: Safety: Goal: Ability to remain free from injury will improve 02/28/2023 1427 by Faustino Congress, RN Outcome: Adequate for Discharge 02/28/2023 1426 by Faustino Congress, RN Outcome: Adequate for Discharge   Problem: Skin Integrity: Goal: Risk for impaired  skin integrity will decrease 02/28/2023 1427 by Faustino Congress, RN Outcome: Adequate for Discharge 02/28/2023 1426 by Faustino Congress, RN Outcome: Adequate for Discharge   Problem: Education: Goal: Ability to describe self-care measures that may prevent or decrease complications (Diabetes Survival Skills Education) will improve 02/28/2023 1427 by Faustino Congress, RN Outcome: Adequate for Discharge 02/28/2023 1426 by Faustino Congress, RN Outcome: Adequate for Discharge Goal: Individualized Educational Video(s) 02/28/2023 1427 by Faustino Congress, RN Outcome: Adequate for Discharge 02/28/2023 1426 by Faustino Congress, RN Outcome: Adequate for Discharge   Problem: Coping: Goal: Ability to adjust to condition or change in health will improve 02/28/2023 1427 by Faustino Congress, RN Outcome: Adequate for Discharge 02/28/2023 1426 by Faustino Congress, RN Outcome: Adequate for Discharge   Problem: Fluid Volume: Goal: Ability to maintain a balanced intake and output will improve 02/28/2023 1427 by Faustino Congress, RN Outcome: Adequate for Discharge 02/28/2023 1426 by Faustino Congress, RN Outcome: Adequate for Discharge   Problem: Health Behavior/Discharge Planning: Goal: Ability to identify and utilize available resources and services will improve 02/28/2023 1427 by Faustino Congress, RN Outcome: Adequate for Discharge 02/28/2023 1426 by Faustino Congress, RN Outcome: Adequate for Discharge Goal: Ability to manage health-related needs will improve 02/28/2023 1427 by Faustino Congress, RN Outcome: Adequate for Discharge 02/28/2023 1426 by Faustino Congress, RN Outcome: Adequate for Discharge   Problem: Metabolic: Goal: Ability to maintain appropriate glucose levels will improve 02/28/2023 1427 by Faustino Congress, RN Outcome: Adequate for  Discharge 02/28/2023 1426 by Faustino Congress, RN Outcome: Adequate for Discharge   Problem: Nutritional: Goal: Maintenance of adequate nutrition will improve 02/28/2023 1427 by Faustino Congress, RN Outcome: Adequate for Discharge 02/28/2023 1426 by Faustino Congress, RN Outcome: Adequate for Discharge Goal: Progress toward achieving an optimal weight will improve 02/28/2023 1427 by Faustino Congress, RN Outcome: Adequate for Discharge 02/28/2023 1426 by Faustino Congress, RN Outcome: Adequate for Discharge   Problem: Skin Integrity: Goal: Risk for impaired skin integrity will decrease 02/28/2023 1427 by Faustino Congress, RN Outcome: Adequate for Discharge 02/28/2023 1426 by Faustino Congress, RN Outcome: Adequate for Discharge   Problem: Tissue Perfusion: Goal: Adequacy of tissue perfusion will improve 02/28/2023 1427 by Faustino Congress, RN Outcome: Adequate for Discharge 02/28/2023 1426 by Faustino Congress, RN Outcome: Adequate for Discharge

## 2023-02-28 NOTE — Discharge Summary (Signed)
Physician Discharge Summary  Patient: Adriana Miller ONG:[id number] DOB: 08-02-1955   Code Status: Do not attempt resuscitation (DNR) PRE-ARREST INTERVENTIONS DESIRED Admit date: 02/14/2023 Discharge date: 02/28/2023 Disposition: Skilled nursing facility, PT and OT PCP: Carlean Jews, PA-C  Recommendations for Outpatient Follow-up:  Follow up with PCP within 1-2 weeks Regarding general hospital follow up and preventative care Recommend repeat CBC. Most recent hgb 8.4 Follow up with ortho Regarding humeral fracture  Discharge Diagnoses:  Principal Problem:   Comminuted left humeral fracture, closed, initial encounter Active Problems:   Accidental fall   Alcoholic cirrhosis of liver without ascites (HCC)   AKI (acute kidney injury) (HCC)   Elevated ETOH level   Alcohol use disorder   History of recurrent UTI (urinary tract infection)   Essential hypertension   Persistent atrial fibrillation (HCC)   Uncontrolled type 2 diabetes mellitus with hyperglycemia, with long-term current use of insulin (HCC)   Chronic anticoagulation   Chronic heart failure with preserved ejection fraction (HFpEF) (HCC)   Type II diabetes mellitus with renal manifestations (HCC)   History of seizure   History of cerebral aneurysm repair   Hyponatremia   Stage 3b chronic kidney disease (HCC)   Acute on chronic anemia   Uncomplicated alcohol dependence (HCC)   Fall   ETOH abuse  Brief Hospital Course Summary: 67 y.o. female with medical history significant for Persistent PAF/flutter on apixaban s/p failed DCCV 12/27/2022, DM2, CKD stage III-IV, cirrhosis  and hepatocellular carcinoma, seizure disorder, cerebral aneurysm status post repair, HTN, and HLD, frequent falls.     Presents to ED due to fall resulting in left proximal humerus fracture. Ortho surgery evaluated and recommended conservative therapy. R arm placed in sling and pain was well controlled. Worked with PT/OT who recommended SNF  at discharge.   As a consequence of her injury and being on eliquis, she had significant ecchymosis on upper extremities and decrease in hgb. Required 1 u pRBCs on 11/1 with holding of her eliquis.  She has since then been stable without new bleeding and restarted on her eliquis for Afib treatment. Most recent hemoglobins: 8.6>8.0>8.3>8.4. She is continuing on iron and vitamin C supplements at discharge.   Her metoprolol home dose was decreased as listed below due to borderline bradycardia which could have contributed to her fall as well as alcohol use. Patient received thiamine supplements and CIWA protocol and did not experience withdrawal symptoms.   Insulin therapy was adjusted while inpatient and should continue to monitor CBGs for further adjustments as needed.   Discharge Condition: Stable, improved Recommended discharge diet: Diabetic diet  Consultations: Ortho surgery   Procedures/Studies: Blood transfusion 11/1  Allergies as of 02/28/2023       Reactions   Aspirin Other (See Comments)   Nose bleed        Medication List     STOP taking these medications    Accu-Chek Guide Me w/Device Kit   Calcium Carb-Cholecalciferol 500-5 MG-MCG Tabs Commonly known as: Calcium 500/D   Dexcom G7 Receiver Devi   Dexcom G7 Sensor Misc   potassium chloride 8 MEQ tablet Commonly known as: KLOR-CON   Toujeo SoloStar 300 UNIT/ML Solostar Pen Generic drug: insulin glargine (1 Unit Dial) Replaced by: insulin glargine-yfgn 100 UNIT/ML injection       TAKE these medications    Accu-Chek Guide test strip Generic drug: glucose blood Use as instructed twice a day DX E11.65   acetaminophen 325 MG tablet Commonly known as: TYLENOL  Take 2 tablets (650 mg total) by mouth every 6 (six) hours as needed for mild pain (or Fever >/= 101).   allopurinol 100 MG tablet Commonly known as: ZYLOPRIM Take 1 tablet (100 mg total) by mouth daily.   apixaban 5 MG Tabs tablet Commonly  known as: Eliquis Take 1 tablet (5 mg total) by mouth 2 (two) times daily.   ascorbic acid 500 MG tablet Commonly known as: VITAMIN C Take 1 tablet (500 mg total) by mouth daily. Start taking on: March 01, 2023   B-D SINGLE USE SWABS REGULAR Pads USE AS DIRECTED TWICE DAILY   BD Pen Needle Nano 2nd Gen 32G X 4 MM Misc Generic drug: Insulin Pen Needle USE AS DIRECTED WITH TOUJEO   escitalopram 10 MG tablet Commonly known as: LEXAPRO Take 1 tablet daily for anxiety/sleep.   ferrous sulfate 325 (65 FE) MG tablet Take 1 tablet (325 mg total) by mouth daily with breakfast.   folic acid 1 MG tablet Commonly known as: FOLVITE Take 1 tablet (1 mg total) by mouth daily.   furosemide 20 MG tablet Commonly known as: LASIX TAKE 1 TABLET BY MOUTH EVERY DAY   ibuprofen 200 MG tablet Commonly known as: ADVIL Take 200 mg by mouth as needed for headache or moderate pain.   insulin aspart 100 UNIT/ML injection Commonly known as: novoLOG Inject 3 Units into the skin 3 (three) times daily with meals.   insulin glargine-yfgn 100 UNIT/ML injection Commonly known as: SEMGLEE Inject 0.12 mLs (12 Units total) into the skin daily. Start taking on: March 01, 2023 Replaces: Toujeo SoloStar 300 UNIT/ML Solostar Pen   metoprolol tartrate 25 MG tablet Commonly known as: LOPRESSOR Take 0.5 tablets (12.5 mg total) by mouth 2 (two) times daily. What changed: how much to take   mirtazapine 7.5 MG tablet Commonly known as: REMERON TAKE 1 TABLET BY MOUTH DAILY AT  BEDTIME   multivitamin with minerals Tabs tablet Take 1 tablet by mouth daily. Start taking on: March 01, 2023   polyethylene glycol 17 g packet Commonly known as: MIRALAX / GLYCOLAX Take 17 g by mouth daily. Start taking on: March 01, 2023   Safety Lancets 28G Misc Use as directed once a daily Dx e11.65   spironolactone 25 MG tablet Commonly known as: ALDACTONE Take 1 tablet (25 mg total) by mouth daily.    thiamine 100 MG tablet Commonly known as: Vitamin B-1 Take 1 tablet (100 mg total) by mouth daily.   traZODone 50 MG tablet Commonly known as: DESYREL TAKE 1 TO 2 TABLETS BY MOUTH AT  BEDTIME FOR INSOMNIA        Contact information for after-discharge care     Destination     [facility] .   Service: Skilled Nursing Contact information: [address] [place] [address] [phone]                    Subjective   Pt reports feeling well. She has no pain in her arm at rest. She feels very ready to be discharged to SNF.   All questions and concerns were addressed at time of discharge.  Objective  Blood pressure 102/77, pulse (!) 53, temperature 98.6 F (37 C), resp. rate 17, height 5\' 6"  (1.676 m), weight 81.6 kg, SpO2 100%.   General: Pt is alert, awake, not in acute distress Cardiovascular: RRR, S1/S2 +, no rubs, no gallops Respiratory: CTA bilaterally, no wheezing, no rhonchi Abdominal: Soft, NT,  ND, bowel sounds + Extremities: both upper extremities, but left worse than right has evolving ecchymosis without tenderness to palpation.   The results of significant diagnostics from this hospitalization (including imaging, microbiology, ancillary and laboratory) are listed below for reference.   Imaging studies: IR Radiologist Eval & Mgmt  Result Date: 02/22/2023 EXAM: ESTABLISHED PATIENT OFFICE VISIT CHIEF COMPLAINT: SEE NOTE IN EPIC HISTORY OF PRESENT ILLNESS: SEE NOTE IN EPIC REVIEW OF SYSTEMS: SEE NOTE IN EPIC PHYSICAL EXAMINATION: SEE NOTE IN EPIC ASSESSMENT AND PLAN: SEE NOTE IN EPIC Electronically Signed   By: Malachy Moan M.D.   On: 02/22/2023 15:12   DG Shoulder Left  Result Date: 02/14/2023 CLINICAL DATA:  fall EXAM: LEFT SHOULDER - 2+ VIEW COMPARISON:  None Available. FINDINGS: Acute comminuted and displaced left humeral head and neck fracture. No dislocation. Mild to moderate degenerative changes of the shoulder.  Otherwise no aggressive appearing focal bone abnormality. Soft tissues are unremarkable. IMPRESSION: Acute comminuted and displaced left humeral head and neck fracture. Electronically Signed   By: Tish Frederickson M.D.   On: 02/14/2023 20:01   DG Chest Portable 1 View  Result Date: 02/14/2023 CLINICAL DATA:  Larey Seat, left shoulder pain EXAM: PORTABLE CHEST 1 VIEW COMPARISON:  12/08/2022 FINDINGS: Single frontal view of the chest demonstrates an unremarkable cardiac silhouette. Lung volumes are diminished, without airspace disease, effusion, or pneumothorax. Comminuted intra-articular left humeral head and neck fracture. No other acute bony abnormalities. IMPRESSION: 1. Comminuted left humeral head and neck fracture. 2. Low lung volumes.  No acute airspace disease. Electronically Signed   By: Sharlet Salina M.D.   On: 02/14/2023 20:01   CT HEAD WO CONTRAST ( )  Result Date: 02/14/2023 CLINICAL DATA:  Head trauma, minor (Age >= 65y); Neck trauma (Age >= 65y). Fall. EXAM: CT HEAD WITHOUT CONTRAST CT CERVICAL SPINE WITHOUT CONTRAST TECHNIQUE: Multidetector CT imaging of the head and cervical spine was performed following the standard protocol without intravenous contrast. Multiplanar CT image reconstructions of the cervical spine were also generated. RADIATION DOSE REDUCTION: This exam was performed according to the departmental dose-optimization program which includes automated exposure control, adjustment of the mA and/or kV according to patient size and/or use of iterative reconstruction technique. COMPARISON:  Head CT 11/17/2022.  CT cervical spine 09/22/2022. FINDINGS: CT HEAD FINDINGS Brain: No acute hemorrhage. 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 Cortical gray-white differentiation is otherwise preserved. Prominence of the ventricles and sulci within expected range for age. No hydrocephalus or  extra-axial collection. No mass effect or midline shift. Vascular: No hyperdense vessel or unexpected calcification. Skull: Prior right frontoparietal craniotomy. No calvarial fracture or suspicious bone lesion. Skull base is unremarkable. Sinuses/Orbits: No acute finding. Other: None. CT CERVICAL SPINE FINDINGS Alignment: Normal. Skull base and vertebrae: No acute fracture. Normal craniocervical junction. No suspicious bone lesions. Soft tissues and spinal canal: No prevertebral fluid or swelling. No visible canal hematoma. Disc levels: Unchanged multilevel cervical spondylosis, worst at C3-4, where there is at least moderate spinal canal stenosis. Upper chest: No acute findings. Other: Atherosclerotic calcifications of the carotid bulbs. IMPRESSION: 1. No acute intracranial abnormality. 2. No acute cervical spine fracture or traumatic listhesis. 3. Unchanged multilevel cervical spondylosis, worst at C3-4, where there is at least moderate spinal canal stenosis. Electronically Signed   By: Orvan Falconer M.D.   On: 02/14/2023 18:22   CT Cervical Spine Wo Contrast  Result Date: 02/14/2023  CLINICAL DATA:  Head trauma, minor (Age >= 65y); Neck trauma (Age >= 65y). Fall. EXAM: CT HEAD WITHOUT CONTRAST CT CERVICAL SPINE WITHOUT CONTRAST TECHNIQUE: Multidetector CT imaging of the head and cervical spine was performed following the standard protocol without intravenous contrast. Multiplanar CT image reconstructions of the cervical spine were also generated. RADIATION DOSE REDUCTION: This exam was performed according to the departmental dose-optimization program which includes automated exposure control, adjustment of the mA and/or kV according to patient size and/or use of iterative reconstruction technique. COMPARISON:  Head CT 11/17/2022.  CT cervical spine 09/22/2022. FINDINGS: CT HEAD FINDINGS Brain: No acute hemorrhage. Unchanged moderate chronic small-vessel disease with old perforator infarcts in the bilateral  basal ganglia and thalami, as well as the right pons. Cortical gray-white differentiation is otherwise preserved. Prominence of the ventricles and sulci within expected range for age. No hydrocephalus or extra-axial collection. No mass effect or midline shift. Vascular: No hyperdense vessel or unexpected calcification. Skull: Prior right frontoparietal craniotomy. No calvarial fracture or suspicious bone lesion. Skull base is unremarkable. Sinuses/Orbits: No acute finding. Other: None. CT CERVICAL SPINE FINDINGS Alignment: Normal. Skull base and vertebrae: No acute fracture. Normal craniocervical junction. No suspicious bone lesions. Soft tissues and spinal canal: No prevertebral fluid or swelling. No visible canal hematoma. Disc levels: Unchanged multilevel cervical spondylosis, worst at C3-4, where there is at least moderate spinal canal stenosis. Upper chest: No acute findings. Other: Atherosclerotic calcifications of the carotid bulbs. IMPRESSION: 1. No acute intracranial abnormality. 2. No acute cervical spine fracture or traumatic listhesis. 3. Unchanged multilevel cervical spondylosis, worst at C3-4, where there is at least moderate spinal canal stenosis. Electronically Signed   By: Orvan Falconer M.D.   On: 02/14/2023 18:22    Labs: Basic Metabolic Panel: Recent Labs  Lab 02/22/23 0448 02/23/23 0425  NA 132* 135  K 3.9 4.3  CL 100 102  CO2 25 26  GLUCOSE 168* 164*  BUN 26* 26*  CREATININE 1.27* 1.25*  CALCIUM 8.6* 8.6*   CBC: Recent Labs  Lab 02/22/23 0448 02/23/23 0425 02/24/23 0425 02/27/23 0454  WBC 5.0 4.8 5.9 5.9  HGB 8.6* 8.0* 8.3* 8.4*  HCT 26.7* 25.3* 25.7* 26.5*  MCV 102.3* 102.0* 101.2* 105.2*  PLT 172 171 175 182   Microbiology: Results for orders placed or performed during the hospital encounter of 12/08/22  Blood Culture (routine x 2)     Status: None   Collection Time: 12/08/22 11:51 PM   Specimen: BLOOD  Result Value Ref Range Status   Specimen Description  BLOOD  LFA  Final   Special Requests   Final    BOTTLES DRAWN AEROBIC AND ANAEROBIC Blood Culture results may not be optimal due to an inadequate volume of blood received in culture bottles   Culture   Final    NO GROWTH 5 DAYS Performed at [facility], [address] [place], [place] [address]    Report Status 12/13/2022 FINAL  Final  Blood Culture (routine x 2)     Status: None   Collection Time: 12/08/22 11:52 PM   Specimen: BLOOD  Result Value Ref Range Status   Specimen Description BLOOD BLOOD LEFT ARM  Final   Special Requests   Final    BOTTLES DRAWN AEROBIC AND ANAEROBIC Blood Culture results may not be optimal due to an inadequate volume of blood received in culture bottles   Culture   Final    NO GROWTH 5 DAYS Performed at [facility],  [address] [place], [place] [address]    Report Status 12/14/2022 FINAL  Final   Time coordinating discharge: Over 30 minutes  Leeroy Bock, MD  [facility] 02/28/2023, 9:52 AM

## 2023-02-28 NOTE — Progress Notes (Signed)
[facility] was called and report was given to nurse Crystal . Waiting on EMS to transport to facility.

## 2023-02-28 NOTE — TOC Transition Note (Signed)
Transition of Care [facility]) - CM/SW Discharge Note   Patient Details  Name: Adriana Miller MRN: [id number] Date of Birth: 03-22-1956  Transition of Care [facility]) CM/SW Contact:  Garret Reddish, RN Phone Number: 02/28/2023, 10:54 AM   Clinical Narrative:    Chart reviewed.  Noted that patient has orders for discharge today.    I have spoken with Kenney Houseman, Admission Coordinator at [facility].  She reports that she will have a bed for patient today.  She informs me that patient will go to room 34 B and the number to call report is [phone]. I have informed Kenney Houseman that SNF approval information is:  Plan auth ID [id number], Auth ID- [id number] and approved form 02-28-20- 03-02-23.  Next review date is 03-02-23.  I have sent Discharge Summary, Discharge Orders, and SNF Transfer Report to [facility] via Hub.  I have informed patient and her brother Don Broach that patient would be going to [facility] today.  I have informed patient and her brother that [facility] will transport patient to the facility today.    I have arranged for [facility] to transport patient to the facility today.    I have informed staff nurse of the above information.     Final next level of care: Skilled Nursing Facility Barriers to Discharge: No Barriers Identified   Patient Goals and CMS Choice CMS Medicare.gov Compare Post Acute Care list provided to:: Patient Choice offered to / list presented to : Patient  Discharge Placement                Patient chooses bed at: [facility] Patient to be transferred to facility by: [facility] Name of family member notified: Don Broach( patient's brother Patient and family notified of of transfer: 02/28/23  Discharge Plan and Services Additional resources added to the After Visit Summary for                                       Social Determinants of Health (SDOH) Interventions SDOH  Screenings   Food Insecurity: No Food Insecurity (02/14/2023)  Housing: Patient Declined (02/14/2023)  Transportation Needs: No Transportation Needs (02/14/2023)  Utilities: Not At Risk (02/14/2023)  Alcohol Screen: Low Risk  (01/27/2023)  Depression (PHQ2-9): Low Risk  (01/27/2023)  Tobacco Use: High Risk (02/25/2023)     Readmission Risk Interventions    11/20/2022    2:04 PM 10/01/2022    9:15 AM  Readmission Risk Prevention Plan  Transportation Screening Complete Complete  Medication Review Oceanographer) Complete Complete  PCP or Specialist appointment within 3-5 days of discharge Complete Complete  HRI or Home Care Consult Complete Complete  SW Recovery Care/Counseling Consult Complete Complete  Palliative Care Screening Not Applicable Not Applicable  Skilled Nursing Facility Not Applicable Not Applicable

## 2023-02-28 NOTE — Care Management Important Message (Signed)
Important Message  Patient Details  Name: Adriana Miller MRN: [id number] Date of Birth: 1956/02/24   Important Message Given:  Yes - Medicare IM     [name] 02/28/2023, 10:38 AM

## 2023-02-28 NOTE — Inpatient Diabetes Management (Signed)
Inpatient Diabetes Program Recommendations  AACE/ADA: New Consensus Statement on Inpatient Glycemic Control (2015)  Target Ranges:  Prepandial:   less than 140 mg/dL      Peak postprandial:   less than 180 mg/dL (1-2 hours)      Critically ill patients:  140 - 180 mg/dL   Lab Results  Component Value Date   GLUCAP 126 (H) 02/28/2023   HGBA1C 5.2 12/22/2022    Review of Glycemic Control  Latest Reference Range & Units 02/27/23 07:30 02/27/23 11:16 02/27/23 16:38 02/27/23 21:01 02/28/23 08:00  Glucose-Capillary 70 - 99 mg/dL 161 (H) 096 (H) 045 (H) 236 (H) 126 (H)   Diabetes history: DM 2 Outpatient Diabetes medications: Toujeo 10 units qhs Current orders for Inpatient glycemic control:  Semglee 12 units Daily Novolog 0-9 units tid Novolog 3 units tid meal coverage  Inpatient Diabetes Program Recommendations:    Note: fasting glucose is WNL, however, glucose trends increase after PO intake.  -   Increase Novolog meal coverage to 6 units tid if eating > 50% of meals -   add Novolog hs scale for bedtime coverage if needed  Thanks,  Christena Deem RN, MSN, BC-ADM Inpatient Diabetes Coordinator Team Pager [phone] (8a-5p)

## 2023-02-28 NOTE — Plan of Care (Signed)
  Problem: Fluid Volume: Goal: Hemodynamic stability will improve Outcome: Adequate for Discharge   Problem: Clinical Measurements: Goal: Diagnostic test results will improve Outcome: Adequate for Discharge Goal: Signs and symptoms of infection will decrease Outcome: Adequate for Discharge   Problem: Respiratory: Goal: Ability to maintain adequate ventilation will improve Outcome: Adequate for Discharge   Problem: Education: Goal: Knowledge of General Education information will improve Description: Including pain rating scale, medication(s)/side effects and non-pharmacologic comfort measures Outcome: Adequate for Discharge   Problem: Health Behavior/Discharge Planning: Goal: Ability to manage health-related needs will improve Outcome: Adequate for Discharge   Problem: Clinical Measurements: Goal: Ability to maintain clinical measurements within normal limits will improve Outcome: Adequate for Discharge Goal: Will remain free from infection Outcome: Adequate for Discharge Goal: Diagnostic test results will improve Outcome: Adequate for Discharge Goal: Respiratory complications will improve Outcome: Adequate for Discharge Goal: Cardiovascular complication will be avoided Outcome: Adequate for Discharge   Problem: Activity: Goal: Risk for activity intolerance will decrease Outcome: Adequate for Discharge   Problem: Nutrition: Goal: Adequate nutrition will be maintained Outcome: Adequate for Discharge   Problem: Coping: Goal: Level of anxiety will decrease Outcome: Adequate for Discharge   Problem: Elimination: Goal: Will not experience complications related to bowel motility Outcome: Adequate for Discharge Goal: Will not experience complications related to urinary retention Outcome: Adequate for Discharge   Problem: Pain Management: Goal: General experience of comfort will improve Outcome: Adequate for Discharge   Problem: Safety: Goal: Ability to remain free  from injury will improve Outcome: Adequate for Discharge   Problem: Skin Integrity: Goal: Risk for impaired skin integrity will decrease Outcome: Adequate for Discharge   Problem: Education: Goal: Ability to describe self-care measures that may prevent or decrease complications (Diabetes Survival Skills Education) will improve Outcome: Adequate for Discharge Goal: Individualized Educational Video(s) Outcome: Adequate for Discharge   Problem: Coping: Goal: Ability to adjust to condition or change in health will improve Outcome: Adequate for Discharge   Problem: Fluid Volume: Goal: Ability to maintain a balanced intake and output will improve Outcome: Adequate for Discharge   Problem: Health Behavior/Discharge Planning: Goal: Ability to identify and utilize available resources and services will improve Outcome: Adequate for Discharge Goal: Ability to manage health-related needs will improve Outcome: Adequate for Discharge   Problem: Metabolic: Goal: Ability to maintain appropriate glucose levels will improve Outcome: Adequate for Discharge   Problem: Nutritional: Goal: Maintenance of adequate nutrition will improve Outcome: Adequate for Discharge Goal: Progress toward achieving an optimal weight will improve Outcome: Adequate for Discharge   Problem: Skin Integrity: Goal: Risk for impaired skin integrity will decrease Outcome: Adequate for Discharge   Problem: Tissue Perfusion: Goal: Adequacy of tissue perfusion will improve Outcome: Adequate for Discharge

## 2023-03-01 DIAGNOSIS — E1129 Type 2 diabetes mellitus with other diabetic kidney complication: Secondary | ICD-10-CM | POA: Diagnosis not present

## 2023-03-01 DIAGNOSIS — Z794 Long term (current) use of insulin: Secondary | ICD-10-CM | POA: Diagnosis not present

## 2023-03-01 DIAGNOSIS — N179 Acute kidney failure, unspecified: Secondary | ICD-10-CM | POA: Diagnosis not present

## 2023-03-01 DIAGNOSIS — D649 Anemia, unspecified: Secondary | ICD-10-CM | POA: Diagnosis not present

## 2023-03-01 DIAGNOSIS — I5032 Chronic diastolic (congestive) heart failure: Secondary | ICD-10-CM | POA: Diagnosis not present

## 2023-03-01 DIAGNOSIS — S42352D Displaced comminuted fracture of shaft of humerus, left arm, subsequent encounter for fracture with routine healing: Secondary | ICD-10-CM | POA: Diagnosis not present

## 2023-03-03 DIAGNOSIS — S42352D Displaced comminuted fracture of shaft of humerus, left arm, subsequent encounter for fracture with routine healing: Secondary | ICD-10-CM | POA: Diagnosis not present

## 2023-03-03 DIAGNOSIS — E1165 Type 2 diabetes mellitus with hyperglycemia: Secondary | ICD-10-CM | POA: Diagnosis not present

## 2023-03-03 DIAGNOSIS — E782 Mixed hyperlipidemia: Secondary | ICD-10-CM | POA: Diagnosis not present

## 2023-03-03 DIAGNOSIS — I5032 Chronic diastolic (congestive) heart failure: Secondary | ICD-10-CM | POA: Diagnosis not present

## 2023-03-03 DIAGNOSIS — C220 Liver cell carcinoma: Secondary | ICD-10-CM | POA: Diagnosis not present

## 2023-03-03 DIAGNOSIS — D649 Anemia, unspecified: Secondary | ICD-10-CM | POA: Diagnosis not present

## 2023-03-03 DIAGNOSIS — E1129 Type 2 diabetes mellitus with other diabetic kidney complication: Secondary | ICD-10-CM | POA: Diagnosis not present

## 2023-03-07 DIAGNOSIS — I10 Essential (primary) hypertension: Secondary | ICD-10-CM | POA: Diagnosis not present

## 2023-03-07 DIAGNOSIS — R2681 Unsteadiness on feet: Secondary | ICD-10-CM | POA: Diagnosis not present

## 2023-03-09 DIAGNOSIS — I5032 Chronic diastolic (congestive) heart failure: Secondary | ICD-10-CM | POA: Diagnosis not present

## 2023-03-09 DIAGNOSIS — S42352D Displaced comminuted fracture of shaft of humerus, left arm, subsequent encounter for fracture with routine healing: Secondary | ICD-10-CM | POA: Diagnosis not present

## 2023-03-09 DIAGNOSIS — E1129 Type 2 diabetes mellitus with other diabetic kidney complication: Secondary | ICD-10-CM | POA: Diagnosis not present

## 2023-03-09 DIAGNOSIS — N179 Acute kidney failure, unspecified: Secondary | ICD-10-CM | POA: Diagnosis not present

## 2023-03-16 ENCOUNTER — Ambulatory Visit: Payer: 59 | Attending: Cardiology | Admitting: Cardiology

## 2023-03-16 NOTE — Progress Notes (Deleted)
  Electrophysiology Office Note:    Date:  03/16/2023   ID:  AZARIE MARGOLIS, DOB [birth year], MRN [id number]  [facility] Cardiologist:  Yvonne Kendall, MD  [facility] Electrophysiologist:  Lanier Prude, MD   Referring MD: Sondra Barges, PA-C   Chief Complaint: Atrial fibrillation  History of Present Illness:    Ms. Maggard is a 67 year old woman who I am seeing today for an evaluation of atrial fibrillation and flutter at the request of Eula Listen.  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  The patient Girtha Hake in clinic January 24, 2023.  Her atrial fibrillation was diagnosed in 2020.  Atrial flutter was diagnosed in 2021.  She has been hospitalized multiple times this year.  She has been cardioverted multiple times in the past.  At the last appoint with Alycia Rossetti she was back in atrial flutter.  She reported missing doses of her Eliquis.  Discussed the use of AI scribe software for clinical note transcription with the patient, who gave verbal consent to proceed.  History of Present Illness                   Their past medical, social and family history was reveiwed.   ROS:   Please see the history of present illness.    All other systems reviewed and are negative.  EKGs/Labs/Other Studies Reviewed:    The following studies were reviewed today:  Aug 22, 2022 echo EF 55-60 RV function normal Moderately dilated left atrium No MR  February 14, 2023 EKG shows atrial fibrillation/flutter.      Physical Exam:    VS:  There were no vitals taken for this visit.    Wt Readings from Last 3 Encounters:  02/14/23 180 lb (81.6 kg)  01/27/23 166 lb 3.2 oz (75.4 kg)  01/24/23 180 lb (81.6 kg)     Physical Exam               ASSESSMENT AND PLAN:    No  diagnosis found.    Assessment and Plan                    Signed, Rossie Muskrat. Lalla Brothers, MD, [facility], [facility] 03/16/2023 1:43 PM    Electrophysiology [facility]

## 2023-03-20 DIAGNOSIS — I872 Venous insufficiency (chronic) (peripheral): Secondary | ICD-10-CM | POA: Diagnosis not present

## 2023-03-20 DIAGNOSIS — N184 Chronic kidney disease, stage 4 (severe): Secondary | ICD-10-CM | POA: Diagnosis not present

## 2023-03-20 DIAGNOSIS — D630 Anemia in neoplastic disease: Secondary | ICD-10-CM | POA: Diagnosis not present

## 2023-03-20 DIAGNOSIS — S72002D Fracture of unspecified part of neck of left femur, subsequent encounter for closed fracture with routine healing: Secondary | ICD-10-CM | POA: Diagnosis not present

## 2023-03-20 DIAGNOSIS — J449 Chronic obstructive pulmonary disease, unspecified: Secondary | ICD-10-CM | POA: Diagnosis not present

## 2023-03-20 DIAGNOSIS — D631 Anemia in chronic kidney disease: Secondary | ICD-10-CM | POA: Diagnosis not present

## 2023-03-20 DIAGNOSIS — E1122 Type 2 diabetes mellitus with diabetic chronic kidney disease: Secondary | ICD-10-CM | POA: Diagnosis not present

## 2023-03-20 DIAGNOSIS — I130 Hypertensive heart and chronic kidney disease with heart failure and stage 1 through stage 4 chronic kidney disease, or unspecified chronic kidney disease: Secondary | ICD-10-CM | POA: Diagnosis not present

## 2023-03-20 DIAGNOSIS — E782 Mixed hyperlipidemia: Secondary | ICD-10-CM | POA: Diagnosis not present

## 2023-03-20 DIAGNOSIS — A419 Sepsis, unspecified organism: Secondary | ICD-10-CM | POA: Diagnosis not present

## 2023-03-20 DIAGNOSIS — E1165 Type 2 diabetes mellitus with hyperglycemia: Secondary | ICD-10-CM | POA: Diagnosis not present

## 2023-03-20 DIAGNOSIS — I4819 Other persistent atrial fibrillation: Secondary | ICD-10-CM | POA: Diagnosis not present

## 2023-03-20 DIAGNOSIS — C220 Liver cell carcinoma: Secondary | ICD-10-CM | POA: Diagnosis not present

## 2023-03-20 DIAGNOSIS — S42352D Displaced comminuted fracture of shaft of humerus, left arm, subsequent encounter for fracture with routine healing: Secondary | ICD-10-CM | POA: Diagnosis not present

## 2023-03-20 DIAGNOSIS — I5032 Chronic diastolic (congestive) heart failure: Secondary | ICD-10-CM | POA: Diagnosis not present

## 2023-03-20 DIAGNOSIS — N179 Acute kidney failure, unspecified: Secondary | ICD-10-CM | POA: Diagnosis not present

## 2023-03-20 DIAGNOSIS — E1129 Type 2 diabetes mellitus with other diabetic kidney complication: Secondary | ICD-10-CM | POA: Diagnosis not present

## 2023-03-20 DIAGNOSIS — I10 Essential (primary) hypertension: Secondary | ICD-10-CM | POA: Diagnosis not present

## 2023-03-20 DIAGNOSIS — N390 Urinary tract infection, site not specified: Secondary | ICD-10-CM | POA: Diagnosis not present

## 2023-03-20 DIAGNOSIS — M47812 Spondylosis without myelopathy or radiculopathy, cervical region: Secondary | ICD-10-CM | POA: Diagnosis not present

## 2023-03-21 DIAGNOSIS — D630 Anemia in neoplastic disease: Secondary | ICD-10-CM | POA: Diagnosis not present

## 2023-03-21 DIAGNOSIS — I4819 Other persistent atrial fibrillation: Secondary | ICD-10-CM | POA: Diagnosis not present

## 2023-03-21 DIAGNOSIS — E1165 Type 2 diabetes mellitus with hyperglycemia: Secondary | ICD-10-CM | POA: Diagnosis not present

## 2023-03-21 DIAGNOSIS — M47812 Spondylosis without myelopathy or radiculopathy, cervical region: Secondary | ICD-10-CM | POA: Diagnosis not present

## 2023-03-21 DIAGNOSIS — I872 Venous insufficiency (chronic) (peripheral): Secondary | ICD-10-CM | POA: Diagnosis not present

## 2023-03-21 DIAGNOSIS — S72002D Fracture of unspecified part of neck of left femur, subsequent encounter for closed fracture with routine healing: Secondary | ICD-10-CM | POA: Diagnosis not present

## 2023-03-21 DIAGNOSIS — N184 Chronic kidney disease, stage 4 (severe): Secondary | ICD-10-CM | POA: Diagnosis not present

## 2023-03-21 DIAGNOSIS — A419 Sepsis, unspecified organism: Secondary | ICD-10-CM | POA: Diagnosis not present

## 2023-03-21 DIAGNOSIS — C220 Liver cell carcinoma: Secondary | ICD-10-CM | POA: Diagnosis not present

## 2023-03-21 DIAGNOSIS — I10 Essential (primary) hypertension: Secondary | ICD-10-CM | POA: Diagnosis not present

## 2023-03-21 DIAGNOSIS — E782 Mixed hyperlipidemia: Secondary | ICD-10-CM | POA: Diagnosis not present

## 2023-03-21 DIAGNOSIS — D631 Anemia in chronic kidney disease: Secondary | ICD-10-CM | POA: Diagnosis not present

## 2023-03-21 DIAGNOSIS — S42352D Displaced comminuted fracture of shaft of humerus, left arm, subsequent encounter for fracture with routine healing: Secondary | ICD-10-CM | POA: Diagnosis not present

## 2023-03-21 DIAGNOSIS — N390 Urinary tract infection, site not specified: Secondary | ICD-10-CM | POA: Diagnosis not present

## 2023-03-21 DIAGNOSIS — E1122 Type 2 diabetes mellitus with diabetic chronic kidney disease: Secondary | ICD-10-CM | POA: Diagnosis not present

## 2023-03-21 DIAGNOSIS — E1129 Type 2 diabetes mellitus with other diabetic kidney complication: Secondary | ICD-10-CM | POA: Diagnosis not present

## 2023-03-21 DIAGNOSIS — I5032 Chronic diastolic (congestive) heart failure: Secondary | ICD-10-CM | POA: Diagnosis not present

## 2023-03-21 DIAGNOSIS — J449 Chronic obstructive pulmonary disease, unspecified: Secondary | ICD-10-CM | POA: Diagnosis not present

## 2023-03-21 DIAGNOSIS — N179 Acute kidney failure, unspecified: Secondary | ICD-10-CM | POA: Diagnosis not present

## 2023-03-21 DIAGNOSIS — I130 Hypertensive heart and chronic kidney disease with heart failure and stage 1 through stage 4 chronic kidney disease, or unspecified chronic kidney disease: Secondary | ICD-10-CM | POA: Diagnosis not present

## 2023-03-22 ENCOUNTER — Telehealth: Payer: Self-pay | Admitting: Physician Assistant

## 2023-03-22 ENCOUNTER — Telehealth: Payer: Self-pay

## 2023-03-22 DIAGNOSIS — S72002D Fracture of unspecified part of neck of left femur, subsequent encounter for closed fracture with routine healing: Secondary | ICD-10-CM | POA: Diagnosis not present

## 2023-03-22 DIAGNOSIS — I10 Essential (primary) hypertension: Secondary | ICD-10-CM | POA: Diagnosis not present

## 2023-03-22 DIAGNOSIS — D630 Anemia in neoplastic disease: Secondary | ICD-10-CM | POA: Diagnosis not present

## 2023-03-22 DIAGNOSIS — N390 Urinary tract infection, site not specified: Secondary | ICD-10-CM | POA: Diagnosis not present

## 2023-03-22 DIAGNOSIS — J449 Chronic obstructive pulmonary disease, unspecified: Secondary | ICD-10-CM | POA: Diagnosis not present

## 2023-03-22 DIAGNOSIS — C220 Liver cell carcinoma: Secondary | ICD-10-CM | POA: Diagnosis not present

## 2023-03-22 DIAGNOSIS — E1122 Type 2 diabetes mellitus with diabetic chronic kidney disease: Secondary | ICD-10-CM | POA: Diagnosis not present

## 2023-03-22 DIAGNOSIS — E1129 Type 2 diabetes mellitus with other diabetic kidney complication: Secondary | ICD-10-CM | POA: Diagnosis not present

## 2023-03-22 DIAGNOSIS — E782 Mixed hyperlipidemia: Secondary | ICD-10-CM | POA: Diagnosis not present

## 2023-03-22 DIAGNOSIS — S42352D Displaced comminuted fracture of shaft of humerus, left arm, subsequent encounter for fracture with routine healing: Secondary | ICD-10-CM | POA: Diagnosis not present

## 2023-03-22 DIAGNOSIS — M47812 Spondylosis without myelopathy or radiculopathy, cervical region: Secondary | ICD-10-CM | POA: Diagnosis not present

## 2023-03-22 DIAGNOSIS — D631 Anemia in chronic kidney disease: Secondary | ICD-10-CM | POA: Diagnosis not present

## 2023-03-22 DIAGNOSIS — I872 Venous insufficiency (chronic) (peripheral): Secondary | ICD-10-CM | POA: Diagnosis not present

## 2023-03-22 DIAGNOSIS — E1165 Type 2 diabetes mellitus with hyperglycemia: Secondary | ICD-10-CM | POA: Diagnosis not present

## 2023-03-22 DIAGNOSIS — A419 Sepsis, unspecified organism: Secondary | ICD-10-CM | POA: Diagnosis not present

## 2023-03-22 DIAGNOSIS — N179 Acute kidney failure, unspecified: Secondary | ICD-10-CM | POA: Diagnosis not present

## 2023-03-22 DIAGNOSIS — N184 Chronic kidney disease, stage 4 (severe): Secondary | ICD-10-CM | POA: Diagnosis not present

## 2023-03-22 DIAGNOSIS — I130 Hypertensive heart and chronic kidney disease with heart failure and stage 1 through stage 4 chronic kidney disease, or unspecified chronic kidney disease: Secondary | ICD-10-CM | POA: Diagnosis not present

## 2023-03-22 DIAGNOSIS — I5032 Chronic diastolic (congestive) heart failure: Secondary | ICD-10-CM | POA: Diagnosis not present

## 2023-03-22 DIAGNOSIS — I4819 Other persistent atrial fibrillation: Secondary | ICD-10-CM | POA: Diagnosis not present

## 2023-03-22 NOTE — Telephone Encounter (Signed)
Received order for shower/tub transfer bench from [employer]. Gave to [employer]

## 2023-03-22 NOTE — Telephone Encounter (Signed)
Gave verbal okay to [place] from [facility] for patient to have OT once weekly for 4 weeks, then patient will have OT every other week.

## 2023-03-23 ENCOUNTER — Ambulatory Visit: Payer: Self-pay

## 2023-03-23 NOTE — Patient Instructions (Signed)
Visit Information  Thank you for taking time to visit with me today. Please don't hesitate to contact me if I can be of assistance to you.   Following are the goals we discussed today:   Goals Addressed             This Visit's Progress    Management of chronic health conditions       Interventions Today    Flowsheet Row Most Recent Value  Chronic Disease   Chronic disease during today's visit Other  [status post left shoulder fracture.]  General Interventions   General Interventions Discussed/Reviewed General Interventions Reviewed, Doctor Visits, Durable Medical Equipment (DME)  [evaluation of current treatment plan for left shoulder fracture and patients adherence to plan as established by provider. Assessed left shoulder pain level.]  Doctor Visits Discussed/Reviewed Doctor Visits Reviewed  [confirmed patient has follow up visit scheduled with orthopedic provider and/ or primary care provider post hospital / SNF discharge.]  Durable Medical Equipment (DME) --  [confirmed patient has needed DME.]  Exercise Interventions   Exercise Discussed/Reviewed Physical Activity  [Assessed patients current activity level.  Advised to follow orthopedic providers activity recommendation. Confirmed patient has transportation to upcoming provider visits.]  Education Interventions   Education Provided Provided Education  [Advised to notify provider for increase pain.  Advised to notify RNCM if Home health services do not start.]  Provided Verbal Education On When to see the doctor  [confirmed patient received discharge paperwork from SNF.  Attempted to review discharge paperwork with patient. Confirmed patient has in home help with ADL's/ IADL's.]  Nutrition Interventions   Nutrition Discussed/Reviewed Nutrition Reviewed  [Confirmed patient has someone to assist her with her meals.]  Pharmacy Interventions   Pharmacy Dicussed/Reviewed Pharmacy Topics Discussed  [Confirmed patient has assistance with  managing medications. Advised to take medications as prescribed. Received verbal authorization from patient to speak with Aide about medications.]  Safety Interventions   Safety Discussed/Reviewed Fall Risk  [Assessed for falls since discharged from SNF on 03/16/23. Assessed for ongoing dizziness/  lightheadedness.]              Our next appointment is by telephone on 03/30/23 at 2 pm  Please call the care guide team at [phone] if you need to cancel or reschedule your appointment.   If you are experiencing a Mental Health or Behavioral Health Crisis or need someone to talk to, please call the Suicide and Crisis Lifeline: 988 call 1-800-273-TALK (toll free, 24 hour hotline)  Patient verbalizes understanding of instructions and care plan provided today and agrees to view in MyChart. Active MyChart status and patient understanding of how to access instructions and care plan via MyChart confirmed with patient.     George Ina RN,BSN,CCM [place]  [facility], [facility] coordinator / Case Manager Phone: [phone]

## 2023-03-23 NOTE — Patient Outreach (Signed)
Care Coordination   Follow Up Visit Note   03/23/2023 Name: Adriana Miller MRN: [id number] DOB: 1955/08/08  Adriana Miller is a 67 y.o. year old female who sees McDonough, Salomon Fick, PA-C for primary care. I spoke with  Adriana Miller by phone today.  What matters to the patients health and wellness today?  Per chart review patient hospitalized from 02/14/23 to 02/28/23 due to left humeral fracture due to fall.  Patient  discharged to SNF from hospital and discharged to home on 03/16/23 per patient.  Patient reports having very limited use of left shoulder. Patient reports 0 pain level in left shoulder. She reports having an aide 7 days per week for 2 1/2 hours. She states her cousin lives down the street from her and assist with her care.   Patient states her aide manages her medication.  Patient gives RNCM to speak with aide for any medication questions.  Patient reports having follow up visit with orthopedic provider on 03/02/23.  Patient states she has transportation assistance for provider appointments with [employer] transportation. Patient states the home health agency representative came out this week and services are to start on next week. Patient states she is able to walk without any ambulatory devices.    Goals Addressed             This Visit's Progress    Management of chronic health conditions       Interventions Today    Flowsheet Row Most Recent Value  Chronic Disease   Chronic disease during today's visit Other  [status post left shoulder fracture.]  General Interventions   General Interventions Discussed/Reviewed General Interventions Reviewed, Doctor Visits, Durable Medical Equipment (DME)  [evaluation of current treatment plan for left shoulder fracture and patients adherence to plan as established by provider. Assessed left shoulder pain level.]  Doctor Visits Discussed/Reviewed Doctor Visits Reviewed  [confirmed patient has follow up visit scheduled with orthopedic  provider and/ or primary care provider post hospital / SNF discharge.]  Durable Medical Equipment (DME) --  [confirmed patient has needed DME.]  Exercise Interventions   Exercise Discussed/Reviewed Physical Activity  [Assessed patients current activity level.  Advised to follow orthopedic providers activity recommendation. Confirmed patient has transportation to upcoming provider visits.]  Education Interventions   Education Provided Provided Education  [Advised to notify provider for increase pain.  Advised to notify RNCM if Home health services do not start.]  Provided Verbal Education On When to see the doctor  [confirmed patient received discharge paperwork from SNF.  Attempted to review discharge paperwork with patient. Confirmed patient has in home help with ADL's/ IADL's.]  Nutrition Interventions   Nutrition Discussed/Reviewed Nutrition Reviewed  [Confirmed patient has someone to assist her with her meals.]  Pharmacy Interventions   Pharmacy Dicussed/Reviewed Pharmacy Topics Discussed  [Confirmed patient has assistance with managing medications. Advised to take medications as prescribed. Received verbal authorization from patient to speak with Aide about medications.]  Safety Interventions   Safety Discussed/Reviewed Fall Risk  [Assessed for falls since discharged from SNF on 03/16/23. Assessed for ongoing dizziness/  lightheadedness.]              SDOH assessments and interventions completed:  No     Care Coordination Interventions:  Yes, provided   Follow up plan: Follow up call scheduled for 03/30/23    Encounter Outcome:  Patient Visit Completed   George Ina RN,BSN,CCM [place]  [facility], [facility] coordinator / Case  Manager Phone: [phone]

## 2023-03-24 ENCOUNTER — Telehealth: Payer: Self-pay | Admitting: Physician Assistant

## 2023-03-24 NOTE — Telephone Encounter (Signed)
shower/tub transfer bench order faxed back to [employer]; [phone]. Scanned-Toni

## 2023-03-25 ENCOUNTER — Telehealth: Payer: Self-pay | Admitting: Physician Assistant

## 2023-03-25 NOTE — Telephone Encounter (Signed)
Received 03/20/2023 Plan of Care from [facility]. Gave to [employer]

## 2023-03-26 ENCOUNTER — Other Ambulatory Visit: Payer: Self-pay | Admitting: Internal Medicine

## 2023-03-28 NOTE — Telephone Encounter (Signed)
last visit: 01/24/23 with plan to f/u in 3 months.  Next visit:  05/02/23

## 2023-03-29 ENCOUNTER — Telehealth: Payer: Self-pay | Admitting: Physician Assistant

## 2023-03-29 NOTE — Telephone Encounter (Signed)
03/20/2023 Plan of Care faxed back to [facility]; [phone] & [phone]. Scanned-Toni

## 2023-03-30 ENCOUNTER — Ambulatory Visit: Payer: Self-pay

## 2023-03-30 NOTE — Patient Outreach (Signed)
  Care Coordination   03/30/2023 Name: Adriana Miller MRN: [id number] DOB: [birth year]   Care Coordination Outreach Attempts:  An unsuccessful telephone outreach was attempted for a scheduled appointment today. Unable to reach patient or leave voice message due to mailbox being full.   Follow Up Plan:  Additional outreach attempts will be made to offer the patient care coordination information and services.   Encounter Outcome:  No Answer   Care Coordination Interventions:  No, not indicated    George Ina RN,BSN,CCM [facility]  [facility], [facility] coordinator / Case Manager Phone: [phone]

## 2023-03-31 ENCOUNTER — Telehealth: Payer: Self-pay | Admitting: Physician Assistant

## 2023-03-31 DIAGNOSIS — E1129 Type 2 diabetes mellitus with other diabetic kidney complication: Secondary | ICD-10-CM | POA: Diagnosis not present

## 2023-03-31 DIAGNOSIS — I10 Essential (primary) hypertension: Secondary | ICD-10-CM | POA: Diagnosis not present

## 2023-03-31 DIAGNOSIS — I4819 Other persistent atrial fibrillation: Secondary | ICD-10-CM | POA: Diagnosis not present

## 2023-03-31 DIAGNOSIS — M47812 Spondylosis without myelopathy or radiculopathy, cervical region: Secondary | ICD-10-CM | POA: Diagnosis not present

## 2023-03-31 DIAGNOSIS — S42352D Displaced comminuted fracture of shaft of humerus, left arm, subsequent encounter for fracture with routine healing: Secondary | ICD-10-CM | POA: Diagnosis not present

## 2023-03-31 DIAGNOSIS — C220 Liver cell carcinoma: Secondary | ICD-10-CM | POA: Diagnosis not present

## 2023-03-31 DIAGNOSIS — D631 Anemia in chronic kidney disease: Secondary | ICD-10-CM | POA: Diagnosis not present

## 2023-03-31 DIAGNOSIS — E782 Mixed hyperlipidemia: Secondary | ICD-10-CM | POA: Diagnosis not present

## 2023-03-31 DIAGNOSIS — N179 Acute kidney failure, unspecified: Secondary | ICD-10-CM | POA: Diagnosis not present

## 2023-03-31 DIAGNOSIS — N184 Chronic kidney disease, stage 4 (severe): Secondary | ICD-10-CM | POA: Diagnosis not present

## 2023-03-31 DIAGNOSIS — E1165 Type 2 diabetes mellitus with hyperglycemia: Secondary | ICD-10-CM | POA: Diagnosis not present

## 2023-03-31 DIAGNOSIS — I5032 Chronic diastolic (congestive) heart failure: Secondary | ICD-10-CM | POA: Diagnosis not present

## 2023-03-31 DIAGNOSIS — S72002D Fracture of unspecified part of neck of left femur, subsequent encounter for closed fracture with routine healing: Secondary | ICD-10-CM | POA: Diagnosis not present

## 2023-03-31 DIAGNOSIS — A419 Sepsis, unspecified organism: Secondary | ICD-10-CM | POA: Diagnosis not present

## 2023-03-31 DIAGNOSIS — N390 Urinary tract infection, site not specified: Secondary | ICD-10-CM | POA: Diagnosis not present

## 2023-03-31 DIAGNOSIS — I872 Venous insufficiency (chronic) (peripheral): Secondary | ICD-10-CM | POA: Diagnosis not present

## 2023-03-31 DIAGNOSIS — I130 Hypertensive heart and chronic kidney disease with heart failure and stage 1 through stage 4 chronic kidney disease, or unspecified chronic kidney disease: Secondary | ICD-10-CM | POA: Diagnosis not present

## 2023-03-31 DIAGNOSIS — D630 Anemia in neoplastic disease: Secondary | ICD-10-CM | POA: Diagnosis not present

## 2023-03-31 DIAGNOSIS — J449 Chronic obstructive pulmonary disease, unspecified: Secondary | ICD-10-CM | POA: Diagnosis not present

## 2023-03-31 DIAGNOSIS — E1122 Type 2 diabetes mellitus with diabetic chronic kidney disease: Secondary | ICD-10-CM | POA: Diagnosis not present

## 2023-04-01 DIAGNOSIS — C220 Liver cell carcinoma: Secondary | ICD-10-CM | POA: Diagnosis not present

## 2023-04-01 DIAGNOSIS — S42292D Other displaced fracture of upper end of left humerus, subsequent encounter for fracture with routine healing: Secondary | ICD-10-CM | POA: Diagnosis not present

## 2023-04-01 DIAGNOSIS — N179 Acute kidney failure, unspecified: Secondary | ICD-10-CM | POA: Diagnosis not present

## 2023-04-01 DIAGNOSIS — E782 Mixed hyperlipidemia: Secondary | ICD-10-CM | POA: Diagnosis not present

## 2023-04-01 DIAGNOSIS — S42352D Displaced comminuted fracture of shaft of humerus, left arm, subsequent encounter for fracture with routine healing: Secondary | ICD-10-CM | POA: Diagnosis not present

## 2023-04-01 DIAGNOSIS — M47812 Spondylosis without myelopathy or radiculopathy, cervical region: Secondary | ICD-10-CM | POA: Diagnosis not present

## 2023-04-01 DIAGNOSIS — N184 Chronic kidney disease, stage 4 (severe): Secondary | ICD-10-CM | POA: Diagnosis not present

## 2023-04-01 DIAGNOSIS — S72002D Fracture of unspecified part of neck of left femur, subsequent encounter for closed fracture with routine healing: Secondary | ICD-10-CM | POA: Diagnosis not present

## 2023-04-01 DIAGNOSIS — I872 Venous insufficiency (chronic) (peripheral): Secondary | ICD-10-CM | POA: Diagnosis not present

## 2023-04-01 DIAGNOSIS — I130 Hypertensive heart and chronic kidney disease with heart failure and stage 1 through stage 4 chronic kidney disease, or unspecified chronic kidney disease: Secondary | ICD-10-CM | POA: Diagnosis not present

## 2023-04-01 DIAGNOSIS — D631 Anemia in chronic kidney disease: Secondary | ICD-10-CM | POA: Diagnosis not present

## 2023-04-01 DIAGNOSIS — D630 Anemia in neoplastic disease: Secondary | ICD-10-CM | POA: Diagnosis not present

## 2023-04-01 DIAGNOSIS — I10 Essential (primary) hypertension: Secondary | ICD-10-CM | POA: Diagnosis not present

## 2023-04-01 DIAGNOSIS — I5032 Chronic diastolic (congestive) heart failure: Secondary | ICD-10-CM | POA: Diagnosis not present

## 2023-04-01 DIAGNOSIS — E1129 Type 2 diabetes mellitus with other diabetic kidney complication: Secondary | ICD-10-CM | POA: Diagnosis not present

## 2023-04-01 DIAGNOSIS — J449 Chronic obstructive pulmonary disease, unspecified: Secondary | ICD-10-CM | POA: Diagnosis not present

## 2023-04-01 DIAGNOSIS — E1122 Type 2 diabetes mellitus with diabetic chronic kidney disease: Secondary | ICD-10-CM | POA: Diagnosis not present

## 2023-04-01 DIAGNOSIS — N390 Urinary tract infection, site not specified: Secondary | ICD-10-CM | POA: Diagnosis not present

## 2023-04-01 DIAGNOSIS — I4819 Other persistent atrial fibrillation: Secondary | ICD-10-CM | POA: Diagnosis not present

## 2023-04-01 DIAGNOSIS — E1165 Type 2 diabetes mellitus with hyperglycemia: Secondary | ICD-10-CM | POA: Diagnosis not present

## 2023-04-01 DIAGNOSIS — A419 Sepsis, unspecified organism: Secondary | ICD-10-CM | POA: Diagnosis not present

## 2023-04-01 NOTE — Telephone Encounter (Signed)
error 

## 2023-04-04 DIAGNOSIS — M47812 Spondylosis without myelopathy or radiculopathy, cervical region: Secondary | ICD-10-CM | POA: Diagnosis not present

## 2023-04-04 DIAGNOSIS — I4819 Other persistent atrial fibrillation: Secondary | ICD-10-CM | POA: Diagnosis not present

## 2023-04-04 DIAGNOSIS — I5032 Chronic diastolic (congestive) heart failure: Secondary | ICD-10-CM | POA: Diagnosis not present

## 2023-04-04 DIAGNOSIS — A419 Sepsis, unspecified organism: Secondary | ICD-10-CM | POA: Diagnosis not present

## 2023-04-04 DIAGNOSIS — D631 Anemia in chronic kidney disease: Secondary | ICD-10-CM | POA: Diagnosis not present

## 2023-04-04 DIAGNOSIS — J449 Chronic obstructive pulmonary disease, unspecified: Secondary | ICD-10-CM | POA: Diagnosis not present

## 2023-04-04 DIAGNOSIS — I10 Essential (primary) hypertension: Secondary | ICD-10-CM | POA: Diagnosis not present

## 2023-04-04 DIAGNOSIS — S72002D Fracture of unspecified part of neck of left femur, subsequent encounter for closed fracture with routine healing: Secondary | ICD-10-CM | POA: Diagnosis not present

## 2023-04-04 DIAGNOSIS — N184 Chronic kidney disease, stage 4 (severe): Secondary | ICD-10-CM | POA: Diagnosis not present

## 2023-04-04 DIAGNOSIS — I872 Venous insufficiency (chronic) (peripheral): Secondary | ICD-10-CM | POA: Diagnosis not present

## 2023-04-04 DIAGNOSIS — N179 Acute kidney failure, unspecified: Secondary | ICD-10-CM | POA: Diagnosis not present

## 2023-04-04 DIAGNOSIS — N390 Urinary tract infection, site not specified: Secondary | ICD-10-CM | POA: Diagnosis not present

## 2023-04-04 DIAGNOSIS — E1122 Type 2 diabetes mellitus with diabetic chronic kidney disease: Secondary | ICD-10-CM | POA: Diagnosis not present

## 2023-04-04 DIAGNOSIS — E782 Mixed hyperlipidemia: Secondary | ICD-10-CM | POA: Diagnosis not present

## 2023-04-04 DIAGNOSIS — S42352D Displaced comminuted fracture of shaft of humerus, left arm, subsequent encounter for fracture with routine healing: Secondary | ICD-10-CM | POA: Diagnosis not present

## 2023-04-04 DIAGNOSIS — C220 Liver cell carcinoma: Secondary | ICD-10-CM | POA: Diagnosis not present

## 2023-04-04 DIAGNOSIS — E1165 Type 2 diabetes mellitus with hyperglycemia: Secondary | ICD-10-CM | POA: Diagnosis not present

## 2023-04-04 DIAGNOSIS — E1129 Type 2 diabetes mellitus with other diabetic kidney complication: Secondary | ICD-10-CM | POA: Diagnosis not present

## 2023-04-04 DIAGNOSIS — D630 Anemia in neoplastic disease: Secondary | ICD-10-CM | POA: Diagnosis not present

## 2023-04-04 DIAGNOSIS — I130 Hypertensive heart and chronic kidney disease with heart failure and stage 1 through stage 4 chronic kidney disease, or unspecified chronic kidney disease: Secondary | ICD-10-CM | POA: Diagnosis not present

## 2023-04-05 ENCOUNTER — Telehealth: Payer: Self-pay | Admitting: Physician Assistant

## 2023-04-05 DIAGNOSIS — S72002D Fracture of unspecified part of neck of left femur, subsequent encounter for closed fracture with routine healing: Secondary | ICD-10-CM | POA: Diagnosis not present

## 2023-04-05 DIAGNOSIS — I10 Essential (primary) hypertension: Secondary | ICD-10-CM | POA: Diagnosis not present

## 2023-04-05 DIAGNOSIS — E1129 Type 2 diabetes mellitus with other diabetic kidney complication: Secondary | ICD-10-CM | POA: Diagnosis not present

## 2023-04-05 DIAGNOSIS — I5032 Chronic diastolic (congestive) heart failure: Secondary | ICD-10-CM | POA: Diagnosis not present

## 2023-04-05 DIAGNOSIS — D630 Anemia in neoplastic disease: Secondary | ICD-10-CM | POA: Diagnosis not present

## 2023-04-05 DIAGNOSIS — D631 Anemia in chronic kidney disease: Secondary | ICD-10-CM | POA: Diagnosis not present

## 2023-04-05 DIAGNOSIS — E1165 Type 2 diabetes mellitus with hyperglycemia: Secondary | ICD-10-CM | POA: Diagnosis not present

## 2023-04-05 DIAGNOSIS — I4819 Other persistent atrial fibrillation: Secondary | ICD-10-CM | POA: Diagnosis not present

## 2023-04-05 DIAGNOSIS — E782 Mixed hyperlipidemia: Secondary | ICD-10-CM | POA: Diagnosis not present

## 2023-04-05 DIAGNOSIS — A419 Sepsis, unspecified organism: Secondary | ICD-10-CM | POA: Diagnosis not present

## 2023-04-05 DIAGNOSIS — E1122 Type 2 diabetes mellitus with diabetic chronic kidney disease: Secondary | ICD-10-CM | POA: Diagnosis not present

## 2023-04-05 DIAGNOSIS — I130 Hypertensive heart and chronic kidney disease with heart failure and stage 1 through stage 4 chronic kidney disease, or unspecified chronic kidney disease: Secondary | ICD-10-CM | POA: Diagnosis not present

## 2023-04-05 DIAGNOSIS — S42352D Displaced comminuted fracture of shaft of humerus, left arm, subsequent encounter for fracture with routine healing: Secondary | ICD-10-CM | POA: Diagnosis not present

## 2023-04-05 DIAGNOSIS — J449 Chronic obstructive pulmonary disease, unspecified: Secondary | ICD-10-CM | POA: Diagnosis not present

## 2023-04-05 DIAGNOSIS — N184 Chronic kidney disease, stage 4 (severe): Secondary | ICD-10-CM | POA: Diagnosis not present

## 2023-04-05 DIAGNOSIS — N390 Urinary tract infection, site not specified: Secondary | ICD-10-CM | POA: Diagnosis not present

## 2023-04-05 DIAGNOSIS — M47812 Spondylosis without myelopathy or radiculopathy, cervical region: Secondary | ICD-10-CM | POA: Diagnosis not present

## 2023-04-05 DIAGNOSIS — I872 Venous insufficiency (chronic) (peripheral): Secondary | ICD-10-CM | POA: Diagnosis not present

## 2023-04-05 DIAGNOSIS — N179 Acute kidney failure, unspecified: Secondary | ICD-10-CM | POA: Diagnosis not present

## 2023-04-05 DIAGNOSIS — C220 Liver cell carcinoma: Secondary | ICD-10-CM | POA: Diagnosis not present

## 2023-04-05 NOTE — Telephone Encounter (Signed)
Shower, tub transfer CMN faxed to [employer]; [phone]

## 2023-04-06 ENCOUNTER — Other Ambulatory Visit: Payer: Self-pay | Admitting: Physician Assistant

## 2023-04-08 DIAGNOSIS — E1122 Type 2 diabetes mellitus with diabetic chronic kidney disease: Secondary | ICD-10-CM | POA: Diagnosis not present

## 2023-04-08 DIAGNOSIS — N179 Acute kidney failure, unspecified: Secondary | ICD-10-CM | POA: Diagnosis not present

## 2023-04-08 DIAGNOSIS — S42352D Displaced comminuted fracture of shaft of humerus, left arm, subsequent encounter for fracture with routine healing: Secondary | ICD-10-CM | POA: Diagnosis not present

## 2023-04-08 DIAGNOSIS — I5032 Chronic diastolic (congestive) heart failure: Secondary | ICD-10-CM | POA: Diagnosis not present

## 2023-04-08 DIAGNOSIS — I872 Venous insufficiency (chronic) (peripheral): Secondary | ICD-10-CM | POA: Diagnosis not present

## 2023-04-08 DIAGNOSIS — S72002D Fracture of unspecified part of neck of left femur, subsequent encounter for closed fracture with routine healing: Secondary | ICD-10-CM | POA: Diagnosis not present

## 2023-04-08 DIAGNOSIS — D631 Anemia in chronic kidney disease: Secondary | ICD-10-CM | POA: Diagnosis not present

## 2023-04-08 DIAGNOSIS — I130 Hypertensive heart and chronic kidney disease with heart failure and stage 1 through stage 4 chronic kidney disease, or unspecified chronic kidney disease: Secondary | ICD-10-CM | POA: Diagnosis not present

## 2023-04-08 DIAGNOSIS — E1165 Type 2 diabetes mellitus with hyperglycemia: Secondary | ICD-10-CM | POA: Diagnosis not present

## 2023-04-08 DIAGNOSIS — N390 Urinary tract infection, site not specified: Secondary | ICD-10-CM | POA: Diagnosis not present

## 2023-04-08 DIAGNOSIS — M47812 Spondylosis without myelopathy or radiculopathy, cervical region: Secondary | ICD-10-CM | POA: Diagnosis not present

## 2023-04-08 DIAGNOSIS — I4819 Other persistent atrial fibrillation: Secondary | ICD-10-CM | POA: Diagnosis not present

## 2023-04-08 DIAGNOSIS — A419 Sepsis, unspecified organism: Secondary | ICD-10-CM | POA: Diagnosis not present

## 2023-04-08 DIAGNOSIS — E782 Mixed hyperlipidemia: Secondary | ICD-10-CM | POA: Diagnosis not present

## 2023-04-08 DIAGNOSIS — I10 Essential (primary) hypertension: Secondary | ICD-10-CM | POA: Diagnosis not present

## 2023-04-08 DIAGNOSIS — E1129 Type 2 diabetes mellitus with other diabetic kidney complication: Secondary | ICD-10-CM | POA: Diagnosis not present

## 2023-04-08 DIAGNOSIS — N184 Chronic kidney disease, stage 4 (severe): Secondary | ICD-10-CM | POA: Diagnosis not present

## 2023-04-08 DIAGNOSIS — D630 Anemia in neoplastic disease: Secondary | ICD-10-CM | POA: Diagnosis not present

## 2023-04-08 DIAGNOSIS — C220 Liver cell carcinoma: Secondary | ICD-10-CM | POA: Diagnosis not present

## 2023-04-08 DIAGNOSIS — J449 Chronic obstructive pulmonary disease, unspecified: Secondary | ICD-10-CM | POA: Diagnosis not present

## 2023-04-11 ENCOUNTER — Ambulatory Visit: Payer: Self-pay

## 2023-04-12 DIAGNOSIS — E1122 Type 2 diabetes mellitus with diabetic chronic kidney disease: Secondary | ICD-10-CM | POA: Diagnosis not present

## 2023-04-12 DIAGNOSIS — N179 Acute kidney failure, unspecified: Secondary | ICD-10-CM | POA: Diagnosis not present

## 2023-04-12 DIAGNOSIS — I5032 Chronic diastolic (congestive) heart failure: Secondary | ICD-10-CM | POA: Diagnosis not present

## 2023-04-12 DIAGNOSIS — D630 Anemia in neoplastic disease: Secondary | ICD-10-CM | POA: Diagnosis not present

## 2023-04-12 DIAGNOSIS — N184 Chronic kidney disease, stage 4 (severe): Secondary | ICD-10-CM | POA: Diagnosis not present

## 2023-04-12 DIAGNOSIS — S42352D Displaced comminuted fracture of shaft of humerus, left arm, subsequent encounter for fracture with routine healing: Secondary | ICD-10-CM | POA: Diagnosis not present

## 2023-04-12 DIAGNOSIS — N390 Urinary tract infection, site not specified: Secondary | ICD-10-CM | POA: Diagnosis not present

## 2023-04-12 DIAGNOSIS — J449 Chronic obstructive pulmonary disease, unspecified: Secondary | ICD-10-CM | POA: Diagnosis not present

## 2023-04-12 DIAGNOSIS — D631 Anemia in chronic kidney disease: Secondary | ICD-10-CM | POA: Diagnosis not present

## 2023-04-12 DIAGNOSIS — S72002D Fracture of unspecified part of neck of left femur, subsequent encounter for closed fracture with routine healing: Secondary | ICD-10-CM | POA: Diagnosis not present

## 2023-04-12 DIAGNOSIS — A419 Sepsis, unspecified organism: Secondary | ICD-10-CM | POA: Diagnosis not present

## 2023-04-12 DIAGNOSIS — M47812 Spondylosis without myelopathy or radiculopathy, cervical region: Secondary | ICD-10-CM | POA: Diagnosis not present

## 2023-04-12 DIAGNOSIS — E782 Mixed hyperlipidemia: Secondary | ICD-10-CM | POA: Diagnosis not present

## 2023-04-12 DIAGNOSIS — I10 Essential (primary) hypertension: Secondary | ICD-10-CM | POA: Diagnosis not present

## 2023-04-12 DIAGNOSIS — E1129 Type 2 diabetes mellitus with other diabetic kidney complication: Secondary | ICD-10-CM | POA: Diagnosis not present

## 2023-04-12 DIAGNOSIS — I4819 Other persistent atrial fibrillation: Secondary | ICD-10-CM | POA: Diagnosis not present

## 2023-04-12 DIAGNOSIS — I872 Venous insufficiency (chronic) (peripheral): Secondary | ICD-10-CM | POA: Diagnosis not present

## 2023-04-12 DIAGNOSIS — E1165 Type 2 diabetes mellitus with hyperglycemia: Secondary | ICD-10-CM | POA: Diagnosis not present

## 2023-04-12 DIAGNOSIS — I130 Hypertensive heart and chronic kidney disease with heart failure and stage 1 through stage 4 chronic kidney disease, or unspecified chronic kidney disease: Secondary | ICD-10-CM | POA: Diagnosis not present

## 2023-04-12 DIAGNOSIS — C220 Liver cell carcinoma: Secondary | ICD-10-CM | POA: Diagnosis not present

## 2023-04-12 NOTE — Patient Outreach (Signed)
  Care Coordination   04/12/2023 Late entry for 04/11/23 Name: DEANETTE KALKOWSKI MRN: [id number] DOB: [birth year]   Care Coordination Outreach Attempts:  An unsuccessful outreach was attempted for an appointment today. Unable to reach patient or leave message due to mailbox being full.   Follow Up Plan:  Additional outreach attempts will be made to offer the patient complex care management information and services.   Encounter Outcome:  No Answer   Care Coordination Interventions:  No, not indicated    George Ina RN,BSN,CCM [facility]  [facility], [facility] coordinator / Case Manager Phone: [phone]

## 2023-04-14 DIAGNOSIS — E782 Mixed hyperlipidemia: Secondary | ICD-10-CM | POA: Diagnosis not present

## 2023-04-14 DIAGNOSIS — J449 Chronic obstructive pulmonary disease, unspecified: Secondary | ICD-10-CM | POA: Diagnosis not present

## 2023-04-14 DIAGNOSIS — E1129 Type 2 diabetes mellitus with other diabetic kidney complication: Secondary | ICD-10-CM | POA: Diagnosis not present

## 2023-04-14 DIAGNOSIS — S72002D Fracture of unspecified part of neck of left femur, subsequent encounter for closed fracture with routine healing: Secondary | ICD-10-CM | POA: Diagnosis not present

## 2023-04-14 DIAGNOSIS — N179 Acute kidney failure, unspecified: Secondary | ICD-10-CM | POA: Diagnosis not present

## 2023-04-14 DIAGNOSIS — I10 Essential (primary) hypertension: Secondary | ICD-10-CM | POA: Diagnosis not present

## 2023-04-14 DIAGNOSIS — I872 Venous insufficiency (chronic) (peripheral): Secondary | ICD-10-CM | POA: Diagnosis not present

## 2023-04-14 DIAGNOSIS — D630 Anemia in neoplastic disease: Secondary | ICD-10-CM | POA: Diagnosis not present

## 2023-04-14 DIAGNOSIS — M47812 Spondylosis without myelopathy or radiculopathy, cervical region: Secondary | ICD-10-CM | POA: Diagnosis not present

## 2023-04-14 DIAGNOSIS — E1122 Type 2 diabetes mellitus with diabetic chronic kidney disease: Secondary | ICD-10-CM | POA: Diagnosis not present

## 2023-04-14 DIAGNOSIS — E1165 Type 2 diabetes mellitus with hyperglycemia: Secondary | ICD-10-CM | POA: Diagnosis not present

## 2023-04-14 DIAGNOSIS — A419 Sepsis, unspecified organism: Secondary | ICD-10-CM | POA: Diagnosis not present

## 2023-04-14 DIAGNOSIS — C220 Liver cell carcinoma: Secondary | ICD-10-CM | POA: Diagnosis not present

## 2023-04-14 DIAGNOSIS — D631 Anemia in chronic kidney disease: Secondary | ICD-10-CM | POA: Diagnosis not present

## 2023-04-14 DIAGNOSIS — N390 Urinary tract infection, site not specified: Secondary | ICD-10-CM | POA: Diagnosis not present

## 2023-04-14 DIAGNOSIS — I130 Hypertensive heart and chronic kidney disease with heart failure and stage 1 through stage 4 chronic kidney disease, or unspecified chronic kidney disease: Secondary | ICD-10-CM | POA: Diagnosis not present

## 2023-04-14 DIAGNOSIS — I4819 Other persistent atrial fibrillation: Secondary | ICD-10-CM | POA: Diagnosis not present

## 2023-04-14 DIAGNOSIS — S42352D Displaced comminuted fracture of shaft of humerus, left arm, subsequent encounter for fracture with routine healing: Secondary | ICD-10-CM | POA: Diagnosis not present

## 2023-04-14 DIAGNOSIS — N184 Chronic kidney disease, stage 4 (severe): Secondary | ICD-10-CM | POA: Diagnosis not present

## 2023-04-14 DIAGNOSIS — I5032 Chronic diastolic (congestive) heart failure: Secondary | ICD-10-CM | POA: Diagnosis not present

## 2023-04-19 DIAGNOSIS — E782 Mixed hyperlipidemia: Secondary | ICD-10-CM | POA: Diagnosis not present

## 2023-04-19 DIAGNOSIS — E1122 Type 2 diabetes mellitus with diabetic chronic kidney disease: Secondary | ICD-10-CM | POA: Diagnosis not present

## 2023-04-19 DIAGNOSIS — A419 Sepsis, unspecified organism: Secondary | ICD-10-CM | POA: Diagnosis not present

## 2023-04-19 DIAGNOSIS — I10 Essential (primary) hypertension: Secondary | ICD-10-CM | POA: Diagnosis not present

## 2023-04-19 DIAGNOSIS — C220 Liver cell carcinoma: Secondary | ICD-10-CM | POA: Diagnosis not present

## 2023-04-19 DIAGNOSIS — N390 Urinary tract infection, site not specified: Secondary | ICD-10-CM | POA: Diagnosis not present

## 2023-04-19 DIAGNOSIS — I872 Venous insufficiency (chronic) (peripheral): Secondary | ICD-10-CM | POA: Diagnosis not present

## 2023-04-19 DIAGNOSIS — E1129 Type 2 diabetes mellitus with other diabetic kidney complication: Secondary | ICD-10-CM | POA: Diagnosis not present

## 2023-04-19 DIAGNOSIS — N179 Acute kidney failure, unspecified: Secondary | ICD-10-CM | POA: Diagnosis not present

## 2023-04-19 DIAGNOSIS — M47812 Spondylosis without myelopathy or radiculopathy, cervical region: Secondary | ICD-10-CM | POA: Diagnosis not present

## 2023-04-19 DIAGNOSIS — D631 Anemia in chronic kidney disease: Secondary | ICD-10-CM | POA: Diagnosis not present

## 2023-04-19 DIAGNOSIS — N184 Chronic kidney disease, stage 4 (severe): Secondary | ICD-10-CM | POA: Diagnosis not present

## 2023-04-19 DIAGNOSIS — D630 Anemia in neoplastic disease: Secondary | ICD-10-CM | POA: Diagnosis not present

## 2023-04-19 DIAGNOSIS — J449 Chronic obstructive pulmonary disease, unspecified: Secondary | ICD-10-CM | POA: Diagnosis not present

## 2023-04-19 DIAGNOSIS — E1165 Type 2 diabetes mellitus with hyperglycemia: Secondary | ICD-10-CM | POA: Diagnosis not present

## 2023-04-19 DIAGNOSIS — I130 Hypertensive heart and chronic kidney disease with heart failure and stage 1 through stage 4 chronic kidney disease, or unspecified chronic kidney disease: Secondary | ICD-10-CM | POA: Diagnosis not present

## 2023-04-19 DIAGNOSIS — S72002D Fracture of unspecified part of neck of left femur, subsequent encounter for closed fracture with routine healing: Secondary | ICD-10-CM | POA: Diagnosis not present

## 2023-04-19 DIAGNOSIS — I5032 Chronic diastolic (congestive) heart failure: Secondary | ICD-10-CM | POA: Diagnosis not present

## 2023-04-19 DIAGNOSIS — S42352D Displaced comminuted fracture of shaft of humerus, left arm, subsequent encounter for fracture with routine healing: Secondary | ICD-10-CM | POA: Diagnosis not present

## 2023-04-19 DIAGNOSIS — I4819 Other persistent atrial fibrillation: Secondary | ICD-10-CM | POA: Diagnosis not present

## 2023-04-25 DIAGNOSIS — I872 Venous insufficiency (chronic) (peripheral): Secondary | ICD-10-CM | POA: Diagnosis not present

## 2023-04-25 DIAGNOSIS — E782 Mixed hyperlipidemia: Secondary | ICD-10-CM | POA: Diagnosis not present

## 2023-04-25 DIAGNOSIS — I130 Hypertensive heart and chronic kidney disease with heart failure and stage 1 through stage 4 chronic kidney disease, or unspecified chronic kidney disease: Secondary | ICD-10-CM | POA: Diagnosis not present

## 2023-04-25 DIAGNOSIS — S42352D Displaced comminuted fracture of shaft of humerus, left arm, subsequent encounter for fracture with routine healing: Secondary | ICD-10-CM | POA: Diagnosis not present

## 2023-04-25 DIAGNOSIS — N179 Acute kidney failure, unspecified: Secondary | ICD-10-CM | POA: Diagnosis not present

## 2023-04-25 DIAGNOSIS — M47812 Spondylosis without myelopathy or radiculopathy, cervical region: Secondary | ICD-10-CM | POA: Diagnosis not present

## 2023-04-25 DIAGNOSIS — N390 Urinary tract infection, site not specified: Secondary | ICD-10-CM | POA: Diagnosis not present

## 2023-04-25 DIAGNOSIS — A419 Sepsis, unspecified organism: Secondary | ICD-10-CM | POA: Diagnosis not present

## 2023-04-25 DIAGNOSIS — D630 Anemia in neoplastic disease: Secondary | ICD-10-CM | POA: Diagnosis not present

## 2023-04-25 DIAGNOSIS — D631 Anemia in chronic kidney disease: Secondary | ICD-10-CM | POA: Diagnosis not present

## 2023-04-25 DIAGNOSIS — I10 Essential (primary) hypertension: Secondary | ICD-10-CM | POA: Diagnosis not present

## 2023-04-25 DIAGNOSIS — C220 Liver cell carcinoma: Secondary | ICD-10-CM | POA: Diagnosis not present

## 2023-04-25 DIAGNOSIS — E1129 Type 2 diabetes mellitus with other diabetic kidney complication: Secondary | ICD-10-CM | POA: Diagnosis not present

## 2023-04-25 DIAGNOSIS — E1122 Type 2 diabetes mellitus with diabetic chronic kidney disease: Secondary | ICD-10-CM | POA: Diagnosis not present

## 2023-04-25 DIAGNOSIS — N184 Chronic kidney disease, stage 4 (severe): Secondary | ICD-10-CM | POA: Diagnosis not present

## 2023-04-25 DIAGNOSIS — E1165 Type 2 diabetes mellitus with hyperglycemia: Secondary | ICD-10-CM | POA: Diagnosis not present

## 2023-04-25 DIAGNOSIS — J449 Chronic obstructive pulmonary disease, unspecified: Secondary | ICD-10-CM | POA: Diagnosis not present

## 2023-04-25 DIAGNOSIS — I5032 Chronic diastolic (congestive) heart failure: Secondary | ICD-10-CM | POA: Diagnosis not present

## 2023-04-25 DIAGNOSIS — S72002D Fracture of unspecified part of neck of left femur, subsequent encounter for closed fracture with routine healing: Secondary | ICD-10-CM | POA: Diagnosis not present

## 2023-04-25 DIAGNOSIS — I4819 Other persistent atrial fibrillation: Secondary | ICD-10-CM | POA: Diagnosis not present

## 2023-04-26 DIAGNOSIS — D630 Anemia in neoplastic disease: Secondary | ICD-10-CM | POA: Diagnosis not present

## 2023-04-26 DIAGNOSIS — N179 Acute kidney failure, unspecified: Secondary | ICD-10-CM | POA: Diagnosis not present

## 2023-04-26 DIAGNOSIS — J449 Chronic obstructive pulmonary disease, unspecified: Secondary | ICD-10-CM | POA: Diagnosis not present

## 2023-04-26 DIAGNOSIS — D631 Anemia in chronic kidney disease: Secondary | ICD-10-CM | POA: Diagnosis not present

## 2023-04-26 DIAGNOSIS — E782 Mixed hyperlipidemia: Secondary | ICD-10-CM | POA: Diagnosis not present

## 2023-04-26 DIAGNOSIS — S42352D Displaced comminuted fracture of shaft of humerus, left arm, subsequent encounter for fracture with routine healing: Secondary | ICD-10-CM | POA: Diagnosis not present

## 2023-04-26 DIAGNOSIS — I872 Venous insufficiency (chronic) (peripheral): Secondary | ICD-10-CM | POA: Diagnosis not present

## 2023-04-26 DIAGNOSIS — I10 Essential (primary) hypertension: Secondary | ICD-10-CM | POA: Diagnosis not present

## 2023-04-26 DIAGNOSIS — M47812 Spondylosis without myelopathy or radiculopathy, cervical region: Secondary | ICD-10-CM | POA: Diagnosis not present

## 2023-04-26 DIAGNOSIS — I130 Hypertensive heart and chronic kidney disease with heart failure and stage 1 through stage 4 chronic kidney disease, or unspecified chronic kidney disease: Secondary | ICD-10-CM | POA: Diagnosis not present

## 2023-04-26 DIAGNOSIS — N184 Chronic kidney disease, stage 4 (severe): Secondary | ICD-10-CM | POA: Diagnosis not present

## 2023-04-26 DIAGNOSIS — E1165 Type 2 diabetes mellitus with hyperglycemia: Secondary | ICD-10-CM | POA: Diagnosis not present

## 2023-04-26 DIAGNOSIS — I4819 Other persistent atrial fibrillation: Secondary | ICD-10-CM | POA: Diagnosis not present

## 2023-04-26 DIAGNOSIS — N390 Urinary tract infection, site not specified: Secondary | ICD-10-CM | POA: Diagnosis not present

## 2023-04-26 DIAGNOSIS — I5032 Chronic diastolic (congestive) heart failure: Secondary | ICD-10-CM | POA: Diagnosis not present

## 2023-04-26 DIAGNOSIS — S72002D Fracture of unspecified part of neck of left femur, subsequent encounter for closed fracture with routine healing: Secondary | ICD-10-CM | POA: Diagnosis not present

## 2023-04-26 DIAGNOSIS — E1122 Type 2 diabetes mellitus with diabetic chronic kidney disease: Secondary | ICD-10-CM | POA: Diagnosis not present

## 2023-04-26 DIAGNOSIS — E1129 Type 2 diabetes mellitus with other diabetic kidney complication: Secondary | ICD-10-CM | POA: Diagnosis not present

## 2023-04-26 DIAGNOSIS — A419 Sepsis, unspecified organism: Secondary | ICD-10-CM | POA: Diagnosis not present

## 2023-04-26 DIAGNOSIS — C220 Liver cell carcinoma: Secondary | ICD-10-CM | POA: Diagnosis not present

## 2023-04-28 ENCOUNTER — Telehealth: Payer: Self-pay | Admitting: Physician Assistant

## 2023-04-28 ENCOUNTER — Telehealth: Payer: Self-pay

## 2023-04-28 ENCOUNTER — Other Ambulatory Visit: Payer: Self-pay

## 2023-04-28 MED ORDER — POTASSIUM CHLORIDE ER 8 MEQ PO TBCR: EXTENDED_RELEASE_TABLET | ORAL | 0 refills | Status: DC

## 2023-04-28 NOTE — Telephone Encounter (Signed)
 Requested Prescriptions   Signed Prescriptions Disp Refills   potassium chloride  (KLOR-CON ) 8 MEQ tablet 36 tablet 0    Sig: TAKE 1 TABLET BY MOUTH 3 TIMES WEEKLY ON MONDAY, WEDNESDAY, AND FRIDAY    Authorizing Provider: END, CHRISTOPHER    Ordering User: Porcia Morganti  C

## 2023-04-28 NOTE — Telephone Encounter (Signed)
 Patient called to report sugar readings have been around 398. Per Leotis Shames, advised patient to please go to the ED.

## 2023-04-28 NOTE — Addendum Note (Signed)
 Addended by: Iverson Alamin C on: 04/28/2023 10:10 AM   Modules accepted: Orders

## 2023-04-28 NOTE — Telephone Encounter (Signed)
 [facility] with [facility] as per lauren D/C calcium with Vitamin D due to pt vitamin d is high range and she can take OTC calcium

## 2023-04-28 NOTE — Telephone Encounter (Signed)
*  STAT* If patient is at the pharmacy, call can be transferred to refill team.   1. Which medications need to be refilled? (please list name of each medication and dose if known)   potassium chloride  (KLOR-CON ) 8 MEQ tablet    4. Which pharmacy/location (including street and city if local pharmacy) is medication to be sent to? [facility] - [address], [place] - [address] Phone: [phone]  Fax: [phone]       5. Do they need a 30 day or 90 day supply? 90

## 2023-05-02 ENCOUNTER — Ambulatory Visit: Payer: 59 | Attending: Physician Assistant | Admitting: Physician Assistant

## 2023-05-02 NOTE — Progress Notes (Deleted)
 Cardiology Office Note    Date:  05/02/2023   ID:  Adriana Miller, DOB 04-02-1956, MRN [id number]  PCP:  Kristina Tinnie POUR, PA-C  Cardiologist:  Lonni Hanson, MD  Electrophysiologist:  OLE ONEIDA HOLTS, MD   Chief Complaint: Follow up  History of Present Illness:   Adriana Miller is a 68 y.o. female with history of PAF/flutter on apixaban , DM2, CKD stage III-IV, cirrhosis complicated by GI bleed secondary to erosive gastropathy in 01/2019 and hepatocellular carcinoma, seizure disorder, cerebral aneurysm status post repair, macrocytic anemia, HTN, and HLD who presents for follow-up of ***.    She was diagnosed with new onset A-fib in 09/2018 and referred to the ED where she was placed on diltiazem  and apixaban .  Plan was to pursue TEE guided DCCV upon establishing with cardiology in 12/2018.  However, she spontaneously converted to sinus rhythm.  Echo from 12/2018 showed an EF of 55 to 60%, normal RV systolic function and ventricular cavity size, trivial tricuspid regurgitation, and an estimated right atrial pressure of 3 mmHg.  She was diagnosed with atrial flutter in 05/2019 with subsequent spontaneous conversion noted at follow-up in 06/2019.  She was noted to be back in A-fib with RVR in 07/2019 and transferred to the ED where she was hydrated and administered IV diltiazem  for rate control with outpatient follow-up.  In follow-up in 10/2019, she had converted to sinus bradycardia, with sinus rhythm noted at cardiology visits thereafter.     She has had multiple hospital admissions over this past year, including several times in 05/2022 for mechanical fall associated with rhabdomyolysis.  In 08/2021 with intractable nausea and vomiting and A-fib with RVR with symptomatic improvement with IV hydration and rate control.  Echo in 08/2022 showed an EF of 55 to 60%, no regional wall motion abnormalities, normal RV systolic function and ventricular cavity size, mildly to moderately dilated left  atrium, and no significant valvular abnormalities.  She was admitted in 09/2022 with E. coli UTI complicated by A-fib with RVR and AKI.   She was seen in the office on 11/16/2022, and was maintaining sinus rhythm at that time.  She reported she was preparing her own medications, though was unclear what she was taking outside of allopurinol , apixaban , and furosemide .  Further recommendations were unable to be made given lack of clarity from a medication perspective with recommendation for home health aide to assist with pillbox or pill pockets.   She was admitted to the hospital in late 10/2022 with severe sepsis secondary to E. coli UTI.  She was in atrial flutter with RVR with rates in the 1-teens bpm.  She was managed by the hospitalist service with recommendation to continue metoprolol  and apixaban  for her A-fib/flutter.  CTA chest showed no acute intracranial abnormality with chronic small vessel changes and prior right craniotomy.  CT of the abdomen/pelvis showed cholelithiasis without evidence of acute cholecystitis, hepatic steatosis, small hiatal hernia, and small uterine fibroid.  She was seen in the ED on 12/08/2022 with weakness.  She remained in atrial flutter with variable AV block with rates in the 1 teens bpm.  Ethanol elevated at 330.  She was felt to be dehydrated and treated with IV fluids with resolution of weakness.     She was seen in the office in 12/2022 and remained in A-fib/flutter.  She reported adherence to apixaban  twice daily and denied missing any doses.  In this setting, she underwent successful DCCV on 12/27/2022.  She was last  seen in the office in 01/2023 with symptoms of angina or cardiac decompensation.  She had redeveloped atrial flutter and was asymptomatic.  She also reported having missed doses of apixaban  following her cardioversion.  She was asymptomatic, though ventricular rates were suboptimally controlled.  Lopressor  was titrated to 25 mg twice daily and she was referred  to EP for further recommendations.  Importance of anticoagulation was discussed.  She is scheduled to see EP next month.  She was admitted to the hospital in 01/2023 fall resulting in a left proximal humerus fracture.  Orthopedics recommended conservative therapy.  As a result of her fall she had significant bruising and required 1 unit PRBC and brief interruption of her apixaban .  There was also concern for borderline bradycardia leading to decreasing of her metoprolol  to 12.5 mg twice daily.  EKG showed rate controlled A-fib.  Troponin negative x 2.  ***   Labs independently reviewed: 02/2023 - Hgb 8.4, PLT 182, potassium 4.3, BUN 26, serum creatinine 1.25 01/2023 - albumin  2.7, AST/ALT normal 12/2022 - A1c 5.2 10/2022 - TSH normal, magnesium  2.1 10/2021 - TC 134, TG 71, HDL 32, LDL 88   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)    B12 deficiency 04/05/2017   Blood in stool    Cerebral aneurysm    Chronic kidney disease    Diabetes mellitus without complication (HCC)    type 2   Gastric erosion with bleeding    Hepatocellular carcinoma (HCC)    Hyperlipidemia    Hypertension    Lower extremity cellulitis 02/08/2019   Paroxysmal A-fib (HCC)    Seizure disorder (HCC)     Past Surgical History:  Procedure Laterality Date   CARDIOVERSION N/A 12/27/2022   Procedure: CARDIOVERSION;  Surgeon: Darliss Rogue, MD;  Location: [facility] ORS;  Service: Cardiovascular;  Laterality: N/A;   CEREBRAL ANEURYSM REPAIR  1991   COLONOSCOPY WITH PROPOFOL  N/A 02/12/2019   Procedure: COLONOSCOPY WITH PROPOFOL ;  Surgeon: Unk Corinn Skiff, MD;  Location: [facility];  Service: Gastroenterology;  Laterality: N/A;   ESOPHAGOGASTRODUODENOSCOPY (EGD) WITH PROPOFOL  N/A 02/12/2019   Procedure: ESOPHAGOGASTRODUODENOSCOPY (EGD) WITH PROPOFOL ;  Surgeon: Unk Corinn Skiff, MD;  Location: [facility] ENDOSCOPY;  Service: Gastroenterology;  Laterality: N/A;   IR  ANGIOGRAM SELECTIVE EACH ADDITIONAL VESSEL  09/21/2019   IR ANGIOGRAM SELECTIVE EACH ADDITIONAL VESSEL  09/21/2019   IR ANGIOGRAM SELECTIVE EACH ADDITIONAL VESSEL  09/21/2019   IR ANGIOGRAM SELECTIVE EACH ADDITIONAL VESSEL  09/21/2019   IR ANGIOGRAM SELECTIVE EACH ADDITIONAL VESSEL  10/02/2019   IR ANGIOGRAM VISCERAL SELECTIVE  09/21/2019   IR ANGIOGRAM VISCERAL SELECTIVE  09/21/2019   IR ANGIOGRAM VISCERAL SELECTIVE  10/02/2019   IR EMBO TUMOR ORGAN ISCHEMIA INFARCT INC GUIDE ROADMAPPING  10/02/2019   IR EMBO TUMOR ORGAN ISCHEMIA INFARCT INC GUIDE ROADMAPPING  10/02/2019   IR RADIOLOGIST EVAL & MGMT  08/28/2019   IR RADIOLOGIST EVAL & MGMT  01/30/2020   IR RADIOLOGIST EVAL & MGMT  04/30/2020   IR RADIOLOGIST EVAL & MGMT  07/30/2020   IR RADIOLOGIST EVAL & MGMT  10/28/2020   IR RADIOLOGIST EVAL & MGMT  02/24/2021   IR RADIOLOGIST EVAL & MGMT  06/08/2021   IR RADIOLOGIST EVAL & MGMT  11/24/2021   IR RADIOLOGIST EVAL & MGMT  05/25/2022   IR RADIOLOGIST EVAL & MGMT  02/22/2023   IR US  GUIDE VASC ACCESS LEFT  09/21/2019   IR US  GUIDE VASC ACCESS LEFT  10/02/2019   OPEN REDUCTION INTERNAL FIXATION (ORIF) DISTAL RADIAL FRACTURE Right 08/06/2021   Procedure: OPEN REDUCTION INTERNAL FIXATION (ORIF) DISTAL RADIAL FRACTURE;  Surgeon: Kathlynn Sharper, MD;  Location: [facility] ORS;  Service: Orthopedics;  Laterality: Right;    Current Medications: No outpatient medications have been marked as taking for the 05/02/23 encounter (Appointment) with Abigail Bernardino HERO, PA-C.    Allergies:   Aspirin   Social History   Socioeconomic History   Marital status: Divorced    Spouse name: Not on file   Number of children: Not on file   Years of education: Not on file   Highest education level: Not on file  Occupational History   Not on file  Tobacco Use   Smoking status: Never   Smokeless tobacco: Current    Types: Snuff  Vaping Use   Vaping status: Never Used  Substance and Sexual Activity   Alcohol  use: Yes    Alcohol /week: 5.0  standard drinks of alcohol     Types: 5 Cans of beer per week   Drug use: No   Sexual activity: Not Currently  Other Topics Concern   Not on file  Social History Narrative   Lives alone   Social Drivers of Health   Financial Resource Strain: Not on file  Food Insecurity: No Food Insecurity (02/14/2023)   Hunger Vital Sign    Worried About Running Out of Food in the Last Year: Never true    Ran Out of Food in the Last Year: Never true  Transportation Needs: No Transportation Needs (02/14/2023)   PRAPARE - Administrator, Civil Service (Medical): No    Lack of Transportation (Non-Medical): No  Physical Activity: Not on file  Stress: Not on file  Social Connections: Not on file     Family History:  The patient's family history includes Heart attack (age of onset: 26) in her brother.  ROS:   12-point review of systems is negative unless otherwise noted in the HPI.   EKGs/Labs/Other Studies Reviewed:    Studies reviewed were summarized above. The additional studies were reviewed today:  2D echo 08/22/2022: 1. Left ventricular ejection fraction, by estimation, is 55 to 60%. The  left ventricle has normal function. The left ventricle has no regional  wall motion abnormalities. Left ventricular diastolic parameters are  indeterminate.   2. Right ventricular systolic function is normal. The right ventricular  size is normal.   3. Left atrial size was mild to moderately dilated.   4. The mitral valve is normal in structure. No evidence of mitral valve  regurgitation.   5. The aortic valve is bicuspid. Aortic valve regurgitation is not  visualized.  __________   2D echo 01/15/2019: 1. Left ventricular ejection fraction, by visual estimation, is 55 to  60%. The left ventricle has normal function. Normal left ventricular size.  There is no left ventricular hypertrophy.   2. Global right ventricle has normal systolic function.The right  ventricular size is normal. No  increase in right ventricular wall  thickness.   3. Left atrial size was normal.   4. Right atrial size was normal.   5. The mitral valve is normal in structure. No evidence of mitral valve  regurgitation. No evidence of mitral stenosis.   6. The tricuspid valve is normal in structure. Tricuspid valve  regurgitation is trivial.   7. The aortic valve is normal in structure. Aortic valve regurgitation  was not visualized by color flow Doppler. Structurally normal aortic  valve, with no evidence of sclerosis or stenosis.   8. The pulmonic valve was normal in structure. Pulmonic valve  regurgitation is trivial by color flow Doppler.   9. TR signal is inadequate for assessing pulmonary artery systolic  pressure.  10. The inferior vena cava is normal in size with greater than 50%  respiratory variability, suggesting right atrial pressure of 3 mmHg.    EKG:  EKG is ordered today.  The EKG ordered today demonstrates ***  Recent Labs: 11/17/2022: B Natriuretic Peptide 152.5; Magnesium  2.1; TSH 3.029 02/14/2023: ALT 16 02/23/2023: BUN 26; Creatinine, Ser 1.25; Potassium 4.3; Sodium 135 02/27/2023: Hemoglobin 8.4; Platelets 182  Recent Lipid Panel    Component Value Date/Time   CHOL 134 11/16/2021 1716   CHOL 134 01/27/2018 1340   TRIG 71 11/16/2021 1716   HDL 32 (L) 11/16/2021 1716   HDL 80 01/27/2018 1340   CHOLHDL 4.2 11/16/2021 1716   VLDL 14 11/16/2021 1716   LDLCALC 88 11/16/2021 1716   LDLCALC 42 01/27/2018 1340    PHYSICAL EXAM:    VS:  There were no vitals taken for this visit.  BMI: There is no height or weight on file to calculate BMI.  Physical Exam  Wt Readings from Last 3 Encounters:  02/14/23 180 lb (81.6 kg)  01/27/23 166 lb 3.2 oz (75.4 kg)  01/24/23 180 lb (81.6 kg)     ASSESSMENT & PLAN:   Persistent A-fib/flutter: ***.  CHA2DS2-VASc at least 4 (HTN, age x 1, DM, sex category)  HTN: Blood pressure  Cirrhosis with hepatic lesions:  CKD stage  III-IV:  Macrocytic anemia:   {Are you ordering a CV Procedure (e.g. stress test, cath, DCCV, TEE, etc)?   Press F2        :[id number]}     Disposition: F/u with Dr. Mady or an APP in ***.   Medication Adjustments/Labs and Tests Ordered: Current medicines are reviewed at length with the patient today.  Concerns regarding medicines are outlined above. Medication changes, Labs and Tests ordered today are summarized above and listed in the Patient Instructions accessible in Encounters.   Signed, Bernardino Bring, PA-C 05/02/2023 9:50 AM     [facility] [address] [place], [place] [address] [phone]

## 2023-05-03 ENCOUNTER — Ambulatory Visit: Payer: Self-pay

## 2023-05-03 NOTE — Patient Outreach (Signed)
  Care Coordination   05/03/2023 Name: Adriana Miller MRN: [id number] DOB: August 04, 1955   Care Coordination Outreach Attempts:  An unsuccessful outreach was attempted for an appointment today. HIPAA compliant message left with return call phone number.   Follow Up Plan:  Additional outreach attempts will be made to offer the patient complex care management information and services.   Encounter Outcome:  No Answer   Care Coordination Interventions:  No, not indicated    Arvin Seip RN,BSN,CCM [facility]  [facility], [facility] coordinator / Case Manager Phone: [phone]

## 2023-05-05 ENCOUNTER — Ambulatory Visit: Payer: Self-pay

## 2023-05-05 DIAGNOSIS — I10 Essential (primary) hypertension: Secondary | ICD-10-CM | POA: Diagnosis not present

## 2023-05-05 DIAGNOSIS — E1165 Type 2 diabetes mellitus with hyperglycemia: Secondary | ICD-10-CM | POA: Diagnosis not present

## 2023-05-05 DIAGNOSIS — C220 Liver cell carcinoma: Secondary | ICD-10-CM | POA: Diagnosis not present

## 2023-05-05 DIAGNOSIS — I5032 Chronic diastolic (congestive) heart failure: Secondary | ICD-10-CM | POA: Diagnosis not present

## 2023-05-05 DIAGNOSIS — J449 Chronic obstructive pulmonary disease, unspecified: Secondary | ICD-10-CM | POA: Diagnosis not present

## 2023-05-05 DIAGNOSIS — N390 Urinary tract infection, site not specified: Secondary | ICD-10-CM | POA: Diagnosis not present

## 2023-05-05 DIAGNOSIS — E782 Mixed hyperlipidemia: Secondary | ICD-10-CM | POA: Diagnosis not present

## 2023-05-05 DIAGNOSIS — D630 Anemia in neoplastic disease: Secondary | ICD-10-CM | POA: Diagnosis not present

## 2023-05-05 DIAGNOSIS — N184 Chronic kidney disease, stage 4 (severe): Secondary | ICD-10-CM | POA: Diagnosis not present

## 2023-05-05 DIAGNOSIS — N179 Acute kidney failure, unspecified: Secondary | ICD-10-CM | POA: Diagnosis not present

## 2023-05-05 DIAGNOSIS — E1129 Type 2 diabetes mellitus with other diabetic kidney complication: Secondary | ICD-10-CM | POA: Diagnosis not present

## 2023-05-05 DIAGNOSIS — S72002D Fracture of unspecified part of neck of left femur, subsequent encounter for closed fracture with routine healing: Secondary | ICD-10-CM | POA: Diagnosis not present

## 2023-05-05 DIAGNOSIS — S42352D Displaced comminuted fracture of shaft of humerus, left arm, subsequent encounter for fracture with routine healing: Secondary | ICD-10-CM | POA: Diagnosis not present

## 2023-05-05 DIAGNOSIS — A419 Sepsis, unspecified organism: Secondary | ICD-10-CM | POA: Diagnosis not present

## 2023-05-05 DIAGNOSIS — I872 Venous insufficiency (chronic) (peripheral): Secondary | ICD-10-CM | POA: Diagnosis not present

## 2023-05-05 DIAGNOSIS — I4819 Other persistent atrial fibrillation: Secondary | ICD-10-CM | POA: Diagnosis not present

## 2023-05-05 DIAGNOSIS — D631 Anemia in chronic kidney disease: Secondary | ICD-10-CM | POA: Diagnosis not present

## 2023-05-05 DIAGNOSIS — M47812 Spondylosis without myelopathy or radiculopathy, cervical region: Secondary | ICD-10-CM | POA: Diagnosis not present

## 2023-05-05 DIAGNOSIS — I130 Hypertensive heart and chronic kidney disease with heart failure and stage 1 through stage 4 chronic kidney disease, or unspecified chronic kidney disease: Secondary | ICD-10-CM | POA: Diagnosis not present

## 2023-05-05 DIAGNOSIS — E1122 Type 2 diabetes mellitus with diabetic chronic kidney disease: Secondary | ICD-10-CM | POA: Diagnosis not present

## 2023-05-05 NOTE — Patient Outreach (Signed)
  Care Coordination   05/05/2023 Name: Adriana Miller MRN: [id number] DOB: 03-Mar-1956   Care Coordination Outreach Attempts:  An unsuccessful outreach was attempted for an appointment today. HIPAA compliant message left with return call phone number.   Follow Up Plan:  Additional outreach attempts will be made to offer the patient complex care management information and services.   Encounter Outcome:  No Answer   Care Coordination Interventions:  No, not indicated    George Ina RN,BSN,CCM [facility]  [facility], [facility] coordinator / Case Manager Phone: [phone]

## 2023-05-06 DIAGNOSIS — S42352D Displaced comminuted fracture of shaft of humerus, left arm, subsequent encounter for fracture with routine healing: Secondary | ICD-10-CM | POA: Diagnosis not present

## 2023-05-06 DIAGNOSIS — E1129 Type 2 diabetes mellitus with other diabetic kidney complication: Secondary | ICD-10-CM | POA: Diagnosis not present

## 2023-05-06 DIAGNOSIS — E1122 Type 2 diabetes mellitus with diabetic chronic kidney disease: Secondary | ICD-10-CM | POA: Diagnosis not present

## 2023-05-06 DIAGNOSIS — A419 Sepsis, unspecified organism: Secondary | ICD-10-CM | POA: Diagnosis not present

## 2023-05-06 DIAGNOSIS — N179 Acute kidney failure, unspecified: Secondary | ICD-10-CM | POA: Diagnosis not present

## 2023-05-06 DIAGNOSIS — S72002D Fracture of unspecified part of neck of left femur, subsequent encounter for closed fracture with routine healing: Secondary | ICD-10-CM | POA: Diagnosis not present

## 2023-05-06 DIAGNOSIS — D630 Anemia in neoplastic disease: Secondary | ICD-10-CM | POA: Diagnosis not present

## 2023-05-06 DIAGNOSIS — J449 Chronic obstructive pulmonary disease, unspecified: Secondary | ICD-10-CM | POA: Diagnosis not present

## 2023-05-06 DIAGNOSIS — E1165 Type 2 diabetes mellitus with hyperglycemia: Secondary | ICD-10-CM | POA: Diagnosis not present

## 2023-05-06 DIAGNOSIS — E782 Mixed hyperlipidemia: Secondary | ICD-10-CM | POA: Diagnosis not present

## 2023-05-06 DIAGNOSIS — N390 Urinary tract infection, site not specified: Secondary | ICD-10-CM | POA: Diagnosis not present

## 2023-05-06 DIAGNOSIS — D631 Anemia in chronic kidney disease: Secondary | ICD-10-CM | POA: Diagnosis not present

## 2023-05-06 DIAGNOSIS — I4819 Other persistent atrial fibrillation: Secondary | ICD-10-CM | POA: Diagnosis not present

## 2023-05-06 DIAGNOSIS — I130 Hypertensive heart and chronic kidney disease with heart failure and stage 1 through stage 4 chronic kidney disease, or unspecified chronic kidney disease: Secondary | ICD-10-CM | POA: Diagnosis not present

## 2023-05-06 DIAGNOSIS — I5032 Chronic diastolic (congestive) heart failure: Secondary | ICD-10-CM | POA: Diagnosis not present

## 2023-05-06 DIAGNOSIS — M47812 Spondylosis without myelopathy or radiculopathy, cervical region: Secondary | ICD-10-CM | POA: Diagnosis not present

## 2023-05-06 DIAGNOSIS — N184 Chronic kidney disease, stage 4 (severe): Secondary | ICD-10-CM | POA: Diagnosis not present

## 2023-05-06 DIAGNOSIS — I10 Essential (primary) hypertension: Secondary | ICD-10-CM | POA: Diagnosis not present

## 2023-05-06 DIAGNOSIS — C220 Liver cell carcinoma: Secondary | ICD-10-CM | POA: Diagnosis not present

## 2023-05-06 DIAGNOSIS — I872 Venous insufficiency (chronic) (peripheral): Secondary | ICD-10-CM | POA: Diagnosis not present

## 2023-05-09 ENCOUNTER — Telehealth: Payer: Self-pay

## 2023-05-09 DIAGNOSIS — N390 Urinary tract infection, site not specified: Secondary | ICD-10-CM | POA: Diagnosis not present

## 2023-05-09 DIAGNOSIS — S42352D Displaced comminuted fracture of shaft of humerus, left arm, subsequent encounter for fracture with routine healing: Secondary | ICD-10-CM | POA: Diagnosis not present

## 2023-05-09 DIAGNOSIS — E1122 Type 2 diabetes mellitus with diabetic chronic kidney disease: Secondary | ICD-10-CM | POA: Diagnosis not present

## 2023-05-09 DIAGNOSIS — E1165 Type 2 diabetes mellitus with hyperglycemia: Secondary | ICD-10-CM | POA: Diagnosis not present

## 2023-05-09 DIAGNOSIS — N179 Acute kidney failure, unspecified: Secondary | ICD-10-CM | POA: Diagnosis not present

## 2023-05-09 DIAGNOSIS — J449 Chronic obstructive pulmonary disease, unspecified: Secondary | ICD-10-CM | POA: Diagnosis not present

## 2023-05-09 DIAGNOSIS — N184 Chronic kidney disease, stage 4 (severe): Secondary | ICD-10-CM | POA: Diagnosis not present

## 2023-05-09 DIAGNOSIS — I4819 Other persistent atrial fibrillation: Secondary | ICD-10-CM | POA: Diagnosis not present

## 2023-05-09 DIAGNOSIS — A419 Sepsis, unspecified organism: Secondary | ICD-10-CM | POA: Diagnosis not present

## 2023-05-09 DIAGNOSIS — E1129 Type 2 diabetes mellitus with other diabetic kidney complication: Secondary | ICD-10-CM | POA: Diagnosis not present

## 2023-05-09 DIAGNOSIS — E782 Mixed hyperlipidemia: Secondary | ICD-10-CM | POA: Diagnosis not present

## 2023-05-09 DIAGNOSIS — S72002D Fracture of unspecified part of neck of left femur, subsequent encounter for closed fracture with routine healing: Secondary | ICD-10-CM | POA: Diagnosis not present

## 2023-05-09 DIAGNOSIS — C220 Liver cell carcinoma: Secondary | ICD-10-CM | POA: Diagnosis not present

## 2023-05-09 DIAGNOSIS — I10 Essential (primary) hypertension: Secondary | ICD-10-CM | POA: Diagnosis not present

## 2023-05-09 DIAGNOSIS — D631 Anemia in chronic kidney disease: Secondary | ICD-10-CM | POA: Diagnosis not present

## 2023-05-09 DIAGNOSIS — I130 Hypertensive heart and chronic kidney disease with heart failure and stage 1 through stage 4 chronic kidney disease, or unspecified chronic kidney disease: Secondary | ICD-10-CM | POA: Diagnosis not present

## 2023-05-09 DIAGNOSIS — I5032 Chronic diastolic (congestive) heart failure: Secondary | ICD-10-CM | POA: Diagnosis not present

## 2023-05-09 DIAGNOSIS — M47812 Spondylosis without myelopathy or radiculopathy, cervical region: Secondary | ICD-10-CM | POA: Diagnosis not present

## 2023-05-09 DIAGNOSIS — I872 Venous insufficiency (chronic) (peripheral): Secondary | ICD-10-CM | POA: Diagnosis not present

## 2023-05-09 DIAGNOSIS — D630 Anemia in neoplastic disease: Secondary | ICD-10-CM | POA: Diagnosis not present

## 2023-05-09 NOTE — Telephone Encounter (Signed)
Judeth Cornfield, OT, from [facility] called for verbal approval for OT recertification. Gave verbal approval.

## 2023-05-11 ENCOUNTER — Telehealth: Payer: Self-pay | Admitting: Physician Assistant

## 2023-05-11 NOTE — Telephone Encounter (Signed)
Received OT order from [facility]. Gave to Lauren for [employer]

## 2023-05-12 DIAGNOSIS — E1165 Type 2 diabetes mellitus with hyperglycemia: Secondary | ICD-10-CM | POA: Diagnosis not present

## 2023-05-12 DIAGNOSIS — I10 Essential (primary) hypertension: Secondary | ICD-10-CM | POA: Diagnosis not present

## 2023-05-12 DIAGNOSIS — M47812 Spondylosis without myelopathy or radiculopathy, cervical region: Secondary | ICD-10-CM | POA: Diagnosis not present

## 2023-05-12 DIAGNOSIS — I4819 Other persistent atrial fibrillation: Secondary | ICD-10-CM | POA: Diagnosis not present

## 2023-05-12 DIAGNOSIS — N179 Acute kidney failure, unspecified: Secondary | ICD-10-CM | POA: Diagnosis not present

## 2023-05-12 DIAGNOSIS — D630 Anemia in neoplastic disease: Secondary | ICD-10-CM | POA: Diagnosis not present

## 2023-05-12 DIAGNOSIS — I5032 Chronic diastolic (congestive) heart failure: Secondary | ICD-10-CM | POA: Diagnosis not present

## 2023-05-12 DIAGNOSIS — S42352D Displaced comminuted fracture of shaft of humerus, left arm, subsequent encounter for fracture with routine healing: Secondary | ICD-10-CM | POA: Diagnosis not present

## 2023-05-12 DIAGNOSIS — I872 Venous insufficiency (chronic) (peripheral): Secondary | ICD-10-CM | POA: Diagnosis not present

## 2023-05-12 DIAGNOSIS — E1129 Type 2 diabetes mellitus with other diabetic kidney complication: Secondary | ICD-10-CM | POA: Diagnosis not present

## 2023-05-12 DIAGNOSIS — D631 Anemia in chronic kidney disease: Secondary | ICD-10-CM | POA: Diagnosis not present

## 2023-05-12 DIAGNOSIS — N184 Chronic kidney disease, stage 4 (severe): Secondary | ICD-10-CM | POA: Diagnosis not present

## 2023-05-12 DIAGNOSIS — S72002D Fracture of unspecified part of neck of left femur, subsequent encounter for closed fracture with routine healing: Secondary | ICD-10-CM | POA: Diagnosis not present

## 2023-05-12 DIAGNOSIS — N390 Urinary tract infection, site not specified: Secondary | ICD-10-CM | POA: Diagnosis not present

## 2023-05-12 DIAGNOSIS — E1122 Type 2 diabetes mellitus with diabetic chronic kidney disease: Secondary | ICD-10-CM | POA: Diagnosis not present

## 2023-05-12 DIAGNOSIS — J449 Chronic obstructive pulmonary disease, unspecified: Secondary | ICD-10-CM | POA: Diagnosis not present

## 2023-05-12 DIAGNOSIS — C220 Liver cell carcinoma: Secondary | ICD-10-CM | POA: Diagnosis not present

## 2023-05-12 DIAGNOSIS — I130 Hypertensive heart and chronic kidney disease with heart failure and stage 1 through stage 4 chronic kidney disease, or unspecified chronic kidney disease: Secondary | ICD-10-CM | POA: Diagnosis not present

## 2023-05-12 DIAGNOSIS — E782 Mixed hyperlipidemia: Secondary | ICD-10-CM | POA: Diagnosis not present

## 2023-05-12 DIAGNOSIS — A419 Sepsis, unspecified organism: Secondary | ICD-10-CM | POA: Diagnosis not present

## 2023-05-13 ENCOUNTER — Telehealth: Payer: Self-pay | Admitting: Physician Assistant

## 2023-05-13 NOTE — Telephone Encounter (Signed)
OT order signed. Faxed back to [facility]; [phone]. Scanned-Toni

## 2023-05-17 ENCOUNTER — Ambulatory Visit: Payer: Self-pay

## 2023-05-17 NOTE — Patient Outreach (Signed)
  Care Coordination   Follow Up Visit Note   05/17/2023 Name: Adriana Miller MRN: [id number] DOB: 12/08/1955  Adriana Miller is a 68 y.o. year old female who sees McDonough, Salomon Fick, PA-C for primary care. I spoke with  Adriana Miller by phone today.  What matters to the patients health and wellness today?  Patient states her shoulder is doing great. She states shoulder is still a little sore based on the way she moves it but feels overall she is doing well.  Patient states she continues to have her aid that assist with her care and has [employer] transportation services. Patient denies any further needs or concerns and is agreeable that care coordination goals have been met.     Goals Addressed             This Visit's Progress    Management of chronic health conditions       Interventions Today    Flowsheet Row Most Recent Value  Chronic Disease   Chronic disease during today's visit Other  [status post left shoulder fracture]  General Interventions   General Interventions Discussed/Reviewed General Interventions Reviewed, Doctor Visits  [evaluation of current treatment plan for listed health condition and patients adherence to plan as established by provider. Assessed for ongoing shoulder pain]  Doctor Visits Discussed/Reviewed Doctor Visits Reviewed  Adriana Miller upcoming provider visits. Advised to keep follow up visits with providers as recommended. Advised patient to contact primary provider office if care coordination services needed in the future.]  Exercise Interventions   Exercise Discussed/Reviewed Physical Activity  [Confirmed patient continues to receive OT home heallth.]  Education Interventions   Education Provided --  [Advised to notify orthopedic surgeon for increase in pain or any new concerns. Encouraged patient to continue doing therapy instructed exercise on non occupational therapy days.]  Pharmacy Interventions   Pharmacy Dicussed/Reviewed Pharmacy Topics Reviewed   [medications reviewed and compliance discussed.]              SDOH assessments and interventions completed:  No     Care Coordination Interventions:  Yes, provided   Follow up plan: No further intervention required.   Encounter Outcome:  Patient Visit Completed   George Ina RN, BSN, CCM [place]  [facility], Population Health Case Manager Phone: [phone]

## 2023-05-17 NOTE — Patient Instructions (Signed)
Visit Information  Thank you for taking time to visit with me today. Your care coordination goal have bee met.    What we discussed today:   Goals Addressed             This Visit's Progress    Management of chronic health conditions       Interventions Today    Flowsheet Row Most Recent Value  Chronic Disease   Chronic disease during today's visit Other  [status post left shoulder fracture]  General Interventions   General Interventions Discussed/Reviewed General Interventions Reviewed, Doctor Visits  [evaluation of current treatment plan for listed health condition and patients adherence to plan as established by provider. Assessed for ongoing shoulder pain]  Doctor Visits Discussed/Reviewed Doctor Visits Reviewed  Annabell Sabal upcoming provider visits. Advised to keep follow up visits with providers as recommended. Advised patient to contact primary provider office if care coordination services needed in the future.]  Exercise Interventions   Exercise Discussed/Reviewed Physical Activity  [Confirmed patient continues to receive OT home heallth.]  Education Interventions   Education Provided --  [Advised to notify orthopedic surgeon for increase in pain or any new concerns. Encouraged patient to continue doing therapy instructed exercise on non occupational therapy days.]  Pharmacy Interventions   Pharmacy Dicussed/Reviewed Pharmacy Topics Reviewed  [medications reviewed and compliance discussed.]              Please contact your primary care provider office if case management services are needed in the future.   If you are experiencing a Mental Health or Behavioral Health Crisis or need someone to talk to, please call the Suicide and Crisis Lifeline: 988 call 1-800-273-TALK (toll free, 24 hour hotline)  The patient verbalized understanding of instructions, educational materials, and care plan provided today and agreed to receive a mailed copy of patient instructions,  educational materials, and care plan.   George Ina RN, BSN, CCM [employer], Population Health Case Manager Phone: [phone]

## 2023-05-19 ENCOUNTER — Ambulatory Visit: Payer: 59 | Admitting: Physician Assistant

## 2023-05-19 DIAGNOSIS — S42352D Displaced comminuted fracture of shaft of humerus, left arm, subsequent encounter for fracture with routine healing: Secondary | ICD-10-CM | POA: Diagnosis not present

## 2023-05-19 DIAGNOSIS — S72002D Fracture of unspecified part of neck of left femur, subsequent encounter for closed fracture with routine healing: Secondary | ICD-10-CM | POA: Diagnosis not present

## 2023-05-19 DIAGNOSIS — N390 Urinary tract infection, site not specified: Secondary | ICD-10-CM | POA: Diagnosis not present

## 2023-05-19 DIAGNOSIS — J449 Chronic obstructive pulmonary disease, unspecified: Secondary | ICD-10-CM | POA: Diagnosis not present

## 2023-05-19 DIAGNOSIS — N184 Chronic kidney disease, stage 4 (severe): Secondary | ICD-10-CM | POA: Diagnosis not present

## 2023-05-19 DIAGNOSIS — E1122 Type 2 diabetes mellitus with diabetic chronic kidney disease: Secondary | ICD-10-CM | POA: Diagnosis not present

## 2023-05-19 DIAGNOSIS — I130 Hypertensive heart and chronic kidney disease with heart failure and stage 1 through stage 4 chronic kidney disease, or unspecified chronic kidney disease: Secondary | ICD-10-CM | POA: Diagnosis not present

## 2023-05-19 DIAGNOSIS — M47812 Spondylosis without myelopathy or radiculopathy, cervical region: Secondary | ICD-10-CM | POA: Diagnosis not present

## 2023-05-19 DIAGNOSIS — D630 Anemia in neoplastic disease: Secondary | ICD-10-CM | POA: Diagnosis not present

## 2023-05-19 DIAGNOSIS — D631 Anemia in chronic kidney disease: Secondary | ICD-10-CM | POA: Diagnosis not present

## 2023-05-19 DIAGNOSIS — I4819 Other persistent atrial fibrillation: Secondary | ICD-10-CM | POA: Diagnosis not present

## 2023-05-19 DIAGNOSIS — I5032 Chronic diastolic (congestive) heart failure: Secondary | ICD-10-CM | POA: Diagnosis not present

## 2023-05-19 DIAGNOSIS — C220 Liver cell carcinoma: Secondary | ICD-10-CM | POA: Diagnosis not present

## 2023-05-19 DIAGNOSIS — E1165 Type 2 diabetes mellitus with hyperglycemia: Secondary | ICD-10-CM | POA: Diagnosis not present

## 2023-05-19 DIAGNOSIS — E782 Mixed hyperlipidemia: Secondary | ICD-10-CM | POA: Diagnosis not present

## 2023-05-19 DIAGNOSIS — A419 Sepsis, unspecified organism: Secondary | ICD-10-CM | POA: Diagnosis not present

## 2023-05-19 DIAGNOSIS — Z794 Long term (current) use of insulin: Secondary | ICD-10-CM | POA: Diagnosis not present

## 2023-05-19 DIAGNOSIS — I872 Venous insufficiency (chronic) (peripheral): Secondary | ICD-10-CM | POA: Diagnosis not present

## 2023-05-20 ENCOUNTER — Other Ambulatory Visit: Payer: Self-pay | Admitting: Physician Assistant

## 2023-05-20 DIAGNOSIS — K7031 Alcoholic cirrhosis of liver with ascites: Secondary | ICD-10-CM

## 2023-05-23 ENCOUNTER — Telehealth: Payer: Self-pay | Admitting: Physician Assistant

## 2023-05-23 NOTE — Telephone Encounter (Signed)
04/22/23 therapy order signed. Faxed back to [facility]; [phone]. Scanned-Toni

## 2023-05-23 NOTE — Telephone Encounter (Signed)
Received 04/22/23 therapy order from [facility]. Gave to [employer]

## 2023-05-24 DIAGNOSIS — E1165 Type 2 diabetes mellitus with hyperglycemia: Secondary | ICD-10-CM | POA: Diagnosis not present

## 2023-05-24 DIAGNOSIS — M47812 Spondylosis without myelopathy or radiculopathy, cervical region: Secondary | ICD-10-CM | POA: Diagnosis not present

## 2023-05-24 DIAGNOSIS — I4819 Other persistent atrial fibrillation: Secondary | ICD-10-CM | POA: Diagnosis not present

## 2023-05-24 DIAGNOSIS — I5032 Chronic diastolic (congestive) heart failure: Secondary | ICD-10-CM | POA: Diagnosis not present

## 2023-05-24 DIAGNOSIS — I130 Hypertensive heart and chronic kidney disease with heart failure and stage 1 through stage 4 chronic kidney disease, or unspecified chronic kidney disease: Secondary | ICD-10-CM | POA: Diagnosis not present

## 2023-05-24 DIAGNOSIS — N390 Urinary tract infection, site not specified: Secondary | ICD-10-CM | POA: Diagnosis not present

## 2023-05-24 DIAGNOSIS — C220 Liver cell carcinoma: Secondary | ICD-10-CM | POA: Diagnosis not present

## 2023-05-24 DIAGNOSIS — S42352D Displaced comminuted fracture of shaft of humerus, left arm, subsequent encounter for fracture with routine healing: Secondary | ICD-10-CM | POA: Diagnosis not present

## 2023-05-24 DIAGNOSIS — D630 Anemia in neoplastic disease: Secondary | ICD-10-CM | POA: Diagnosis not present

## 2023-05-24 DIAGNOSIS — E1122 Type 2 diabetes mellitus with diabetic chronic kidney disease: Secondary | ICD-10-CM | POA: Diagnosis not present

## 2023-05-24 DIAGNOSIS — I872 Venous insufficiency (chronic) (peripheral): Secondary | ICD-10-CM | POA: Diagnosis not present

## 2023-05-24 DIAGNOSIS — S72002D Fracture of unspecified part of neck of left femur, subsequent encounter for closed fracture with routine healing: Secondary | ICD-10-CM | POA: Diagnosis not present

## 2023-05-24 DIAGNOSIS — E782 Mixed hyperlipidemia: Secondary | ICD-10-CM | POA: Diagnosis not present

## 2023-05-24 DIAGNOSIS — J449 Chronic obstructive pulmonary disease, unspecified: Secondary | ICD-10-CM | POA: Diagnosis not present

## 2023-05-24 DIAGNOSIS — D631 Anemia in chronic kidney disease: Secondary | ICD-10-CM | POA: Diagnosis not present

## 2023-05-24 DIAGNOSIS — A419 Sepsis, unspecified organism: Secondary | ICD-10-CM | POA: Diagnosis not present

## 2023-05-24 DIAGNOSIS — N184 Chronic kidney disease, stage 4 (severe): Secondary | ICD-10-CM | POA: Diagnosis not present

## 2023-05-24 DIAGNOSIS — Z794 Long term (current) use of insulin: Secondary | ICD-10-CM | POA: Diagnosis not present

## 2023-05-28 ENCOUNTER — Other Ambulatory Visit: Payer: Self-pay | Admitting: Internal Medicine

## 2023-05-31 DIAGNOSIS — I5032 Chronic diastolic (congestive) heart failure: Secondary | ICD-10-CM | POA: Diagnosis not present

## 2023-05-31 DIAGNOSIS — N390 Urinary tract infection, site not specified: Secondary | ICD-10-CM | POA: Diagnosis not present

## 2023-05-31 DIAGNOSIS — D631 Anemia in chronic kidney disease: Secondary | ICD-10-CM | POA: Diagnosis not present

## 2023-05-31 DIAGNOSIS — S42352D Displaced comminuted fracture of shaft of humerus, left arm, subsequent encounter for fracture with routine healing: Secondary | ICD-10-CM | POA: Diagnosis not present

## 2023-05-31 DIAGNOSIS — S72002D Fracture of unspecified part of neck of left femur, subsequent encounter for closed fracture with routine healing: Secondary | ICD-10-CM | POA: Diagnosis not present

## 2023-05-31 DIAGNOSIS — M47812 Spondylosis without myelopathy or radiculopathy, cervical region: Secondary | ICD-10-CM | POA: Diagnosis not present

## 2023-05-31 DIAGNOSIS — J449 Chronic obstructive pulmonary disease, unspecified: Secondary | ICD-10-CM | POA: Diagnosis not present

## 2023-05-31 DIAGNOSIS — E782 Mixed hyperlipidemia: Secondary | ICD-10-CM | POA: Diagnosis not present

## 2023-05-31 DIAGNOSIS — A419 Sepsis, unspecified organism: Secondary | ICD-10-CM | POA: Diagnosis not present

## 2023-05-31 DIAGNOSIS — I4819 Other persistent atrial fibrillation: Secondary | ICD-10-CM | POA: Diagnosis not present

## 2023-05-31 DIAGNOSIS — C220 Liver cell carcinoma: Secondary | ICD-10-CM | POA: Diagnosis not present

## 2023-05-31 DIAGNOSIS — I130 Hypertensive heart and chronic kidney disease with heart failure and stage 1 through stage 4 chronic kidney disease, or unspecified chronic kidney disease: Secondary | ICD-10-CM | POA: Diagnosis not present

## 2023-05-31 DIAGNOSIS — D630 Anemia in neoplastic disease: Secondary | ICD-10-CM | POA: Diagnosis not present

## 2023-05-31 DIAGNOSIS — I872 Venous insufficiency (chronic) (peripheral): Secondary | ICD-10-CM | POA: Diagnosis not present

## 2023-05-31 DIAGNOSIS — E1165 Type 2 diabetes mellitus with hyperglycemia: Secondary | ICD-10-CM | POA: Diagnosis not present

## 2023-05-31 DIAGNOSIS — Z794 Long term (current) use of insulin: Secondary | ICD-10-CM | POA: Diagnosis not present

## 2023-05-31 DIAGNOSIS — N184 Chronic kidney disease, stage 4 (severe): Secondary | ICD-10-CM | POA: Diagnosis not present

## 2023-05-31 DIAGNOSIS — E1122 Type 2 diabetes mellitus with diabetic chronic kidney disease: Secondary | ICD-10-CM | POA: Diagnosis not present

## 2023-06-06 ENCOUNTER — Encounter: Payer: Self-pay | Admitting: Physician Assistant

## 2023-06-06 ENCOUNTER — Ambulatory Visit: Payer: 59 | Admitting: Physician Assistant

## 2023-06-06 VITALS — BP 122/85 | HR 76 | Temp 98.4°F | Resp 16 | Ht 66.0 in | Wt 174.0 lb

## 2023-06-06 DIAGNOSIS — E119 Type 2 diabetes mellitus without complications: Secondary | ICD-10-CM

## 2023-06-06 DIAGNOSIS — Z794 Long term (current) use of insulin: Secondary | ICD-10-CM

## 2023-06-06 DIAGNOSIS — I10 Essential (primary) hypertension: Secondary | ICD-10-CM

## 2023-06-06 DIAGNOSIS — I480 Paroxysmal atrial fibrillation: Secondary | ICD-10-CM

## 2023-06-06 DIAGNOSIS — K7030 Alcoholic cirrhosis of liver without ascites: Secondary | ICD-10-CM

## 2023-06-06 DIAGNOSIS — L602 Onychogryphosis: Secondary | ICD-10-CM | POA: Diagnosis not present

## 2023-06-06 DIAGNOSIS — Z0000 Encounter for general adult medical examination without abnormal findings: Secondary | ICD-10-CM | POA: Diagnosis not present

## 2023-06-06 DIAGNOSIS — Z0100 Encounter for examination of eyes and vision without abnormal findings: Secondary | ICD-10-CM | POA: Diagnosis not present

## 2023-06-06 DIAGNOSIS — Z0189 Encounter for other specified special examinations: Secondary | ICD-10-CM

## 2023-06-06 NOTE — Progress Notes (Signed)
 [facility] [address] [place], [place] [address]  Internal MEDICINE  Office Visit Note  Patient Name: Adriana Miller  [id number]  [id number]  Date of Service: 06/17/2023  Chief Complaint  Patient presents with   Medicare Wellness   Diabetes   Hypertension   Hyperlipidemia   Quality Metric Gaps    Mammogram, Eye Exam, Bone Density Scan    HPI Adriana Miller presents for an annual well visit  Well-appearing 68 y.o. female Routine CRC screening: Done in 2020 Routine mammogram: declines DEXA scan: wants to hold off for now Eye exam and/or foot exam: overdue for eye exam, would like podiatry referral resent Labs: Done in Sept New or worsening pain: Following with ortho with shoulder after fall. It is doing better Other concerns: MRI liver cancelled while admitted, plan for retry in Feb (has not heard yet) -seeing cardiology on Feb 26     06/06/2023    3:00 PM 05/13/2022    2:57 PM 05/11/2021    2:24 PM  MMSE - Mini Mental State Exam  Orientation to time 5 5 5   Orientation to Place 5 5 5   Registration 3 3 3   Attention/ Calculation 5 5 0  Recall 3 3 3   Language- name 2 objects 2 2 2   Language- repeat 1 1 0  Language- follow 3 step command 3 3 0  Language- read & follow direction 1 1 1   Write a sentence 1 1 1   Copy design 1 1 1   Total score 30 30 21     Functional Status Survey: Is the patient deaf or have difficulty hearing?: No Does the patient have difficulty seeing, even when wearing glasses/contacts?: No Does the patient have difficulty concentrating, remembering, or making decisions?: No Does the patient have difficulty walking or climbing stairs?: No Does the patient have difficulty dressing or bathing?: No Does the patient have difficulty doing errands alone such as visiting a doctor's office or shopping?: No     07/23/2021    3:37 PM 11/09/2021    1:25 PM 05/13/2022    2:56 PM 01/27/2023    2:37 PM 06/06/2023    3:00 PM  Fall Risk  Falls in the  past year?  1 0 0 1  Was there an injury with Fall?  1   1  Fall Risk Category Calculator  2   2  Fall Risk Category (Retired)  Moderate     (RETIRED) Patient Fall Risk Level Moderate fall risk           01/27/2023    2:37 PM  Depression screen PHQ 2/9  Decreased Interest 0  Down, Depressed, Hopeless 0  PHQ - 2 Score 0        No data to display            Current Medication: Outpatient Encounter Medications as of 06/06/2023  Medication Sig   Accu-Chek Softclix Lancets lancets USE AS DIRECTED ONCE DAILY   acetaminophen (TYLENOL) 325 MG tablet Take 2 tablets (650 mg total) by mouth every 6 (six) hours as needed for mild pain (or Fever >/= 101).   Alcohol Swabs (B-D SINGLE USE SWABS REGULAR) PADS USE AS DIRECTED TWICE DAILY   allopurinol (ZYLOPRIM) 100 MG tablet Take 1 tablet (100 mg total) by mouth daily.   apixaban (ELIQUIS) 5 MG TABS tablet Take 1 tablet (5 mg total) by mouth 2 (two) times daily.   ascorbic acid (VITAMIN C) 500 MG tablet Take 1 tablet (500 mg total)  by mouth daily.   escitalopram (LEXAPRO) 10 MG tablet Take 1 tablet daily for anxiety/sleep.   ferrous sulfate 325 (65 FE) MG tablet Take 1 tablet (325 mg total) by mouth daily with breakfast.   folic acid (FOLVITE) 1 MG tablet Take 1 tablet (1 mg total) by mouth daily.   furosemide (LASIX) 20 MG tablet TAKE 1 TABLET BY MOUTH EVERY DAY   glucose blood (ACCU-CHEK GUIDE) test strip Use as instructed twice a day DX E11.65   ibuprofen (ADVIL) 200 MG tablet Take 200 mg by mouth as needed for headache or moderate pain.   insulin aspart (NOVOLOG) 100 UNIT/ML injection Inject 3 Units into the skin 3 (three) times daily with meals.   insulin glargine-yfgn (SEMGLEE) 100 UNIT/ML injection Inject 0.12 mLs (12 Units total) into the skin daily.   Insulin Pen Needle (BD PEN NEEDLE NANO 2ND GEN) 32G X 4 MM MISC USE AS DIRECTED WITH TOUJEO   metoprolol tartrate (LOPRESSOR) 25 MG tablet Take 0.5 tablets (12.5 mg total) by mouth 2  (two) times daily.   mirtazapine (REMERON) 7.5 MG tablet TAKE 1 TABLET BY MOUTH DAILY AT  BEDTIME   Multiple Vitamin (MULTIVITAMIN WITH MINERALS) TABS tablet Take 1 tablet by mouth daily.   polyethylene glycol (MIRALAX / GLYCOLAX) 17 g packet Take 17 g by mouth daily.   potassium chloride (KLOR-CON) 8 MEQ tablet TAKE 1 TABLET BY MOUTH 3 TIMES WEEKLY ON MONDAY, WEDNESDAY, AND FRIDAY   spironolactone (ALDACTONE) 25 MG tablet TAKE 1 TABLET BY MOUTH DAILY FOR CIRRHOSIS OF LIVER   thiamine (VITAMIN B-1) 100 MG tablet Take 1 tablet (100 mg total) by mouth daily.   traZODone (DESYREL) 50 MG tablet TAKE 1 TO 2 TABLETS BY MOUTH AT  BEDTIME FOR INSOMNIA   No facility-administered encounter medications on file as of 06/06/2023.    Surgical History: Past Surgical History:  Procedure Laterality Date   CARDIOVERSION N/A 12/27/2022   Procedure: CARDIOVERSION;  Surgeon: Debbe Odea, MD;  Location: [facility] ORS;  Service: Cardiovascular;  Laterality: N/A;   CEREBRAL ANEURYSM REPAIR  1991   COLONOSCOPY WITH PROPOFOL N/A 02/12/2019   Procedure: COLONOSCOPY WITH PROPOFOL;  Surgeon: Toney Reil, MD;  Location: [facility] ENDOSCOPY;  Service: Gastroenterology;  Laterality: N/A;   ESOPHAGOGASTRODUODENOSCOPY (EGD) WITH PROPOFOL N/A 02/12/2019   Procedure: ESOPHAGOGASTRODUODENOSCOPY (EGD) WITH PROPOFOL;  Surgeon: Toney Reil, MD;  Location: [facility] ENDOSCOPY;  Service: Gastroenterology;  Laterality: N/A;   IR ANGIOGRAM SELECTIVE EACH ADDITIONAL VESSEL  09/21/2019   IR ANGIOGRAM SELECTIVE EACH ADDITIONAL VESSEL  09/21/2019   IR ANGIOGRAM SELECTIVE EACH ADDITIONAL VESSEL  09/21/2019   IR ANGIOGRAM SELECTIVE EACH ADDITIONAL VESSEL  09/21/2019   IR ANGIOGRAM SELECTIVE EACH ADDITIONAL VESSEL  10/02/2019   IR ANGIOGRAM VISCERAL SELECTIVE  09/21/2019   IR ANGIOGRAM VISCERAL SELECTIVE  09/21/2019   IR ANGIOGRAM VISCERAL SELECTIVE  10/02/2019   IR EMBO TUMOR ORGAN ISCHEMIA INFARCT INC GUIDE ROADMAPPING  10/02/2019   IR EMBO  TUMOR ORGAN ISCHEMIA INFARCT INC GUIDE ROADMAPPING  10/02/2019   IR RADIOLOGIST EVAL & MGMT  08/28/2019   IR RADIOLOGIST EVAL & MGMT  01/30/2020   IR RADIOLOGIST EVAL & MGMT  04/30/2020   IR RADIOLOGIST EVAL & MGMT  07/30/2020   IR RADIOLOGIST EVAL & MGMT  10/28/2020   IR RADIOLOGIST EVAL & MGMT  02/24/2021   IR RADIOLOGIST EVAL & MGMT  06/08/2021   IR RADIOLOGIST EVAL & MGMT  11/24/2021   IR RADIOLOGIST EVAL & MGMT  05/25/2022  IR RADIOLOGIST EVAL & MGMT  02/22/2023   IR US GUIDE VASC ACCESS LEFT  09/21/2019   IR US GUIDE VASC ACCESS LEFT  10/02/2019   OPEN REDUCTION INTERNAL FIXATION (ORIF) DISTAL RADIAL FRACTURE Right 08/06/2021   Procedure: OPEN REDUCTION INTERNAL FIXATION (ORIF) DISTAL RADIAL FRACTURE;  Surgeon: Kennedy Bucker, MD;  Location: [facility] ORS;  Service: Orthopedics;  Laterality: Right;    Medical History: 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)    B12 deficiency 04/05/2017   Blood in stool    Cerebral aneurysm    Chronic kidney disease    Diabetes mellitus without complication (HCC)    type 2   Gastric erosion with bleeding    Hepatocellular carcinoma (HCC)    Hyperlipidemia    Hypertension    Lower extremity cellulitis 02/08/2019   Paroxysmal A-fib (HCC)    Seizure disorder (HCC)     Family History: Family History  Problem Relation Age of Onset   Heart attack Brother 81    Social History   Socioeconomic History   Marital status: Divorced    Spouse name: Not on file   Number of children: Not on file   Years of education: Not on file   Highest education level: Not on file  Occupational History   Not on file  Tobacco Use   Smoking status: Never   Smokeless tobacco: Current    Types: Snuff  Vaping Use   Vaping status: Never Used  Substance and Sexual Activity   Alcohol use: Yes    Alcohol/week: 5.0 standard drinks of alcohol    Types: 5 Cans of beer per week   Drug use: No   Sexual activity: Not Currently   Other Topics Concern   Not on file  Social History Narrative   Lives alone   Social Drivers of Health   Financial Resource Strain: Not on file  Food Insecurity: No Food Insecurity (02/14/2023)   Hunger Vital Sign    Worried About Running Out of Food in the Last Year: Never true    Ran Out of Food in the Last Year: Never true  Transportation Needs: No Transportation Needs (02/14/2023)   PRAPARE - Administrator, Civil Service (Medical): No    Lack of Transportation (Non-Medical): No  Physical Activity: Not on file  Stress: Not on file  Social Connections: Not on file  Intimate Partner Violence: Not At Risk (02/14/2023)   Humiliation, Afraid, Rape, and Kick questionnaire    Fear of Current or Ex-Partner: No    Emotionally Abused: No    Physically Abused: No    Sexually Abused: No      Review of Systems  Constitutional:  Negative for chills and unexpected weight change.  HENT:  Negative for congestion, rhinorrhea, sneezing and sore throat.   Eyes:  Negative for redness.  Respiratory:  Negative for cough, chest tightness and shortness of breath.   Cardiovascular:  Negative for chest pain and palpitations.  Gastrointestinal:  Negative for abdominal pain, constipation, diarrhea, nausea and vomiting.  Genitourinary:  Negative for dysuria and frequency.  Musculoskeletal:  Positive for arthralgias. Negative for back pain, joint swelling and neck pain.  Skin:  Negative for rash.  Neurological:  Positive for weakness. Negative for tremors and numbness.  Hematological:  Negative for adenopathy. Does not bruise/bleed easily.  Psychiatric/Behavioral:  Negative for behavioral problems (Depression), sleep disturbance and suicidal ideas. The patient is not nervous/anxious.  Vital Signs: BP 122/85   Pulse 76   Temp 98.4 F (36.9 C)   Resp 16   Ht 5\' 6"  (1.676 m)   Wt 174 lb (78.9 kg)   SpO2 98%   BMI 28.08 kg/m    Physical Exam Vitals and nursing note reviewed.   Constitutional:      General: She is not in acute distress.    Appearance: She is well-developed. She is not diaphoretic.  HENT:     Head: Normocephalic and atraumatic.  Neck:     Thyroid: No thyromegaly.     Vascular: No JVD.     Trachea: No tracheal deviation.  Cardiovascular:     Rate and Rhythm: Normal rate and regular rhythm.     Heart sounds: Normal heart sounds.  Pulmonary:     Effort: Pulmonary effort is normal. No respiratory distress.     Breath sounds: No wheezing or rales.  Chest:     Chest wall: No tenderness.  Skin:    General: Skin is warm and dry.  Neurological:     Mental Status: She is alert.     Gait: Gait abnormal.  Psychiatric:        Behavior: Behavior normal.        Thought Content: Thought content normal.        Judgment: Judgment normal.        Assessment/Plan: 1. Encounter for Medicare annual wellness exam (Primary) AWV performed, patient declines mammogram and bone density at this time  2. Essential hypertension Stable, continue current medication  3. Type 2 diabetes mellitus without complication, with long-term current use of insulin (HCC) Will continue with current medication and monitoring.  Will need eye exam - Ambulatory referral to Ophthalmology  4. Paroxysmal atrial fibrillation (HCC) Followed by cardiology and has upcoming appointment  5. Alcoholic cirrhosis of liver without ascites (HCC) Overdue for MRI of liver that was postponed due to recent hospitalization and should be rescheduled soon  6. Encounter for eye exam - Ambulatory referral to Ophthalmology  7. Long toenail States she never established with podiatry and needs help with management of nails and diabetic footcare - Ambulatory referral to Podiatry  8. Encounter for diabetic foot exam [facility]) - Ambulatory referral to Podiatry     General Counseling: Gaylan Gerold understanding of the findings of todays visit and agrees with plan of treatment. I have  discussed any further diagnostic evaluation that may be needed or ordered today. We also reviewed her medications today. she has been encouraged to call the office with any questions or concerns that should arise related to todays visit.    Orders Placed This Encounter  Procedures   Ambulatory referral to Ophthalmology   Ambulatory referral to Podiatry    No orders of the defined types were placed in this encounter.   Return in about 4 months (around 10/04/2023) for general follow up, needs med list print.   Total time spent:35 Minutes Time spent includes review of chart, medications, test results, and follow up plan with the patient.   [place] Controlled Substance Database was reviewed by me.  This patient was seen by Lynn Ito, PA-C in collaboration with Dr. Beverely Risen as a part of collaborative care agreement.  Lynn Ito, PA-C Internal medicine

## 2023-06-08 ENCOUNTER — Telehealth: Payer: Self-pay | Admitting: Physician Assistant

## 2023-06-08 NOTE — Telephone Encounter (Signed)
Awaiting 06/06/23 office notes for Podiatry & Ophthalmology referral-Toni

## 2023-06-09 DIAGNOSIS — S42352D Displaced comminuted fracture of shaft of humerus, left arm, subsequent encounter for fracture with routine healing: Secondary | ICD-10-CM | POA: Diagnosis not present

## 2023-06-09 DIAGNOSIS — N390 Urinary tract infection, site not specified: Secondary | ICD-10-CM | POA: Diagnosis not present

## 2023-06-09 DIAGNOSIS — E1165 Type 2 diabetes mellitus with hyperglycemia: Secondary | ICD-10-CM | POA: Diagnosis not present

## 2023-06-09 DIAGNOSIS — J449 Chronic obstructive pulmonary disease, unspecified: Secondary | ICD-10-CM | POA: Diagnosis not present

## 2023-06-09 DIAGNOSIS — A419 Sepsis, unspecified organism: Secondary | ICD-10-CM | POA: Diagnosis not present

## 2023-06-09 DIAGNOSIS — I130 Hypertensive heart and chronic kidney disease with heart failure and stage 1 through stage 4 chronic kidney disease, or unspecified chronic kidney disease: Secondary | ICD-10-CM | POA: Diagnosis not present

## 2023-06-09 DIAGNOSIS — M47812 Spondylosis without myelopathy or radiculopathy, cervical region: Secondary | ICD-10-CM | POA: Diagnosis not present

## 2023-06-09 DIAGNOSIS — S72002D Fracture of unspecified part of neck of left femur, subsequent encounter for closed fracture with routine healing: Secondary | ICD-10-CM | POA: Diagnosis not present

## 2023-06-09 DIAGNOSIS — I5032 Chronic diastolic (congestive) heart failure: Secondary | ICD-10-CM | POA: Diagnosis not present

## 2023-06-09 DIAGNOSIS — D630 Anemia in neoplastic disease: Secondary | ICD-10-CM | POA: Diagnosis not present

## 2023-06-09 DIAGNOSIS — D631 Anemia in chronic kidney disease: Secondary | ICD-10-CM | POA: Diagnosis not present

## 2023-06-09 DIAGNOSIS — I4819 Other persistent atrial fibrillation: Secondary | ICD-10-CM | POA: Diagnosis not present

## 2023-06-09 DIAGNOSIS — E1122 Type 2 diabetes mellitus with diabetic chronic kidney disease: Secondary | ICD-10-CM | POA: Diagnosis not present

## 2023-06-09 DIAGNOSIS — N184 Chronic kidney disease, stage 4 (severe): Secondary | ICD-10-CM | POA: Diagnosis not present

## 2023-06-09 DIAGNOSIS — C220 Liver cell carcinoma: Secondary | ICD-10-CM | POA: Diagnosis not present

## 2023-06-09 DIAGNOSIS — I872 Venous insufficiency (chronic) (peripheral): Secondary | ICD-10-CM | POA: Diagnosis not present

## 2023-06-09 DIAGNOSIS — E782 Mixed hyperlipidemia: Secondary | ICD-10-CM | POA: Diagnosis not present

## 2023-06-09 DIAGNOSIS — Z794 Long term (current) use of insulin: Secondary | ICD-10-CM | POA: Diagnosis not present

## 2023-06-15 ENCOUNTER — Encounter: Payer: Self-pay | Admitting: Cardiology

## 2023-06-15 ENCOUNTER — Ambulatory Visit: Payer: 59 | Attending: Cardiology | Admitting: Cardiology

## 2023-06-15 VITALS — BP 120/79 | HR 69 | Ht 66.0 in | Wt 174.2 lb

## 2023-06-15 DIAGNOSIS — C220 Liver cell carcinoma: Secondary | ICD-10-CM | POA: Diagnosis not present

## 2023-06-15 DIAGNOSIS — N390 Urinary tract infection, site not specified: Secondary | ICD-10-CM | POA: Diagnosis not present

## 2023-06-15 DIAGNOSIS — I130 Hypertensive heart and chronic kidney disease with heart failure and stage 1 through stage 4 chronic kidney disease, or unspecified chronic kidney disease: Secondary | ICD-10-CM | POA: Diagnosis not present

## 2023-06-15 DIAGNOSIS — E1122 Type 2 diabetes mellitus with diabetic chronic kidney disease: Secondary | ICD-10-CM | POA: Diagnosis not present

## 2023-06-15 DIAGNOSIS — J449 Chronic obstructive pulmonary disease, unspecified: Secondary | ICD-10-CM | POA: Diagnosis not present

## 2023-06-15 DIAGNOSIS — I4819 Other persistent atrial fibrillation: Secondary | ICD-10-CM | POA: Diagnosis not present

## 2023-06-15 DIAGNOSIS — S42352D Displaced comminuted fracture of shaft of humerus, left arm, subsequent encounter for fracture with routine healing: Secondary | ICD-10-CM | POA: Diagnosis not present

## 2023-06-15 DIAGNOSIS — M47812 Spondylosis without myelopathy or radiculopathy, cervical region: Secondary | ICD-10-CM | POA: Diagnosis not present

## 2023-06-15 DIAGNOSIS — D630 Anemia in neoplastic disease: Secondary | ICD-10-CM | POA: Diagnosis not present

## 2023-06-15 DIAGNOSIS — D631 Anemia in chronic kidney disease: Secondary | ICD-10-CM | POA: Diagnosis not present

## 2023-06-15 DIAGNOSIS — Z794 Long term (current) use of insulin: Secondary | ICD-10-CM | POA: Diagnosis not present

## 2023-06-15 DIAGNOSIS — I4892 Unspecified atrial flutter: Secondary | ICD-10-CM | POA: Diagnosis not present

## 2023-06-15 DIAGNOSIS — S72002D Fracture of unspecified part of neck of left femur, subsequent encounter for closed fracture with routine healing: Secondary | ICD-10-CM | POA: Diagnosis not present

## 2023-06-15 DIAGNOSIS — I10 Essential (primary) hypertension: Secondary | ICD-10-CM | POA: Diagnosis not present

## 2023-06-15 DIAGNOSIS — N184 Chronic kidney disease, stage 4 (severe): Secondary | ICD-10-CM | POA: Diagnosis not present

## 2023-06-15 DIAGNOSIS — E782 Mixed hyperlipidemia: Secondary | ICD-10-CM | POA: Diagnosis not present

## 2023-06-15 DIAGNOSIS — I872 Venous insufficiency (chronic) (peripheral): Secondary | ICD-10-CM | POA: Diagnosis not present

## 2023-06-15 DIAGNOSIS — I5032 Chronic diastolic (congestive) heart failure: Secondary | ICD-10-CM | POA: Diagnosis not present

## 2023-06-15 DIAGNOSIS — E1165 Type 2 diabetes mellitus with hyperglycemia: Secondary | ICD-10-CM | POA: Diagnosis not present

## 2023-06-15 DIAGNOSIS — A419 Sepsis, unspecified organism: Secondary | ICD-10-CM | POA: Diagnosis not present

## 2023-06-15 NOTE — Progress Notes (Signed)
  Electrophysiology Office Note:    Date:  06/15/2023   ID:  Adriana Miller, Adriana Miller March 31, 1956, MRN [id number]  [facility] Cardiologist:  Yvonne Kendall, MD  [facility] Electrophysiologist:  Lanier Prude, MD   Referring MD: Sondra Barges, PA-C   Chief Complaint: AF  History of Present Illness:    Adriana Miller is a 68 year old woman who I am seeing today for an evaluation of atrial fibrillation and flutter at the request of Eula Listen, PA-C.  The patient has a history of paroxysmal atrial fibrillation and flutter on Eliquis, diabetes, CKD 3/4, cirrhosis complicated by GI bleeding, hepatocellular carcinoma, seizure disorder, cerebral aneurysm, hypertension and hyperlipidemia.  Her atrial fibrillation was diagnosed in June 2020.  She has been hospitalized multiple times for atrial fibrillation.  She has had cardioversions in the past for her A-fib.  She is doing well today.  No complaints. She says she walks around her home and uses a wheelchair only for longer distances.    Their past medical, social and family history was reviewed.   ROS:   Please see the history of present illness.    All other systems reviewed and are negative.  EKGs/Labs/Other Studies Reviewed:    The following studies were reviewed today:  May 2024 echo EF 55-60 RV normal Moderately dilated left atrium  February 14, 2023 EKG shows atrial fibrillation/atypical flutter  February 27, 2023 lab work shows a hemoglobin of 8.4  Lab work reviewed shows creatinine as high as 2.41 in October downtrending to 1.25 in November 2024. EKG Interpretation Date/Time:  Wednesday June 15 2023 14:04:47 EST Ventricular Rate:  69 PR Interval:  146 QRS Duration:  84 QT Interval:  460 QTC Calculation: 492 R Axis:   22  Text Interpretation: Sinus rhythm with Premature atrial complexes Confirmed by Steffanie Dunn [phone]) on 06/15/2023 2:09:51 PM    Physical Exam:    VS:  BP 120/79 (BP Location: Left Arm,  Patient Position: Sitting, Cuff Size: Normal)   Pulse 69   Ht 5\' 6"  (1.676 m)   Wt 174 lb 3.2 oz (79 kg)   SpO2 96%   BMI 28.12 kg/m     Wt Readings from Last 3 Encounters:  06/15/23 174 lb 3.2 oz (79 kg)  06/06/23 174 lb (78.9 kg)  02/14/23 180 lb (81.6 kg)     GEN: no distress CARD: RRR, No MRG RESP: No IWOB. CTAB.        ASSESSMENT AND PLAN:    1. Persistent atrial fibrillation (HCC)   2. Atrial flutter, unspecified type (HCC)   3. Essential hypertension     #Persistent atrial fibrillation and flutter On Eliquis for stroke prophylaxis.  We discussed her atrial fibrillation extensively during today's office visit.  We discussed her stroke risk and rhythm control options.  Given her extensive medical comorbidities, I do not think she is a candidate for invasive management of her arrhythmias.  Medical management will also be difficult given her history of cirrhosis and CKD.  For now, I have recommended continuing metoprolol.  #Cirrhosis Appears compensated today Continue spironolactone and Lasix.   She will follow-up with EP on an as-needed basis   Signed, Rossie Muskrat. Lalla Brothers, MD, [facility], [facility] 06/15/2023 2:15 PM    Electrophysiology [facility]

## 2023-06-15 NOTE — Patient Instructions (Signed)
 Medication Instructions:  Your physician recommends that you continue on your current medications as directed. Please refer to the Current Medication list given to you today.  *If you need a refill on your cardiac medications before your next appointment, please call your pharmacy*  Follow-Up: At [facility], you and your health needs are our priority.  As part of our continuing mission to provide you with exceptional heart care, we have created designated Provider Care Teams.  These Care Teams include your primary Cardiologist (physician) and Advanced Practice Providers (APPs -  Physician Assistants and Nurse Practitioners) who all work together to provide you with the care you need, when you need it.  Your next appointment:   As needed with Dr. Lalla Brothers

## 2023-06-20 ENCOUNTER — Other Ambulatory Visit: Payer: Self-pay | Admitting: Physician Assistant

## 2023-06-20 DIAGNOSIS — G2581 Restless legs syndrome: Secondary | ICD-10-CM

## 2023-06-21 NOTE — Telephone Encounter (Signed)
 Please review ok to send its showing interact

## 2023-06-22 DIAGNOSIS — E1122 Type 2 diabetes mellitus with diabetic chronic kidney disease: Secondary | ICD-10-CM | POA: Diagnosis not present

## 2023-06-22 DIAGNOSIS — A419 Sepsis, unspecified organism: Secondary | ICD-10-CM | POA: Diagnosis not present

## 2023-06-22 DIAGNOSIS — J449 Chronic obstructive pulmonary disease, unspecified: Secondary | ICD-10-CM | POA: Diagnosis not present

## 2023-06-22 DIAGNOSIS — I872 Venous insufficiency (chronic) (peripheral): Secondary | ICD-10-CM | POA: Diagnosis not present

## 2023-06-22 DIAGNOSIS — I5032 Chronic diastolic (congestive) heart failure: Secondary | ICD-10-CM | POA: Diagnosis not present

## 2023-06-22 DIAGNOSIS — N184 Chronic kidney disease, stage 4 (severe): Secondary | ICD-10-CM | POA: Diagnosis not present

## 2023-06-22 DIAGNOSIS — C220 Liver cell carcinoma: Secondary | ICD-10-CM | POA: Diagnosis not present

## 2023-06-22 DIAGNOSIS — N390 Urinary tract infection, site not specified: Secondary | ICD-10-CM | POA: Diagnosis not present

## 2023-06-22 DIAGNOSIS — E1165 Type 2 diabetes mellitus with hyperglycemia: Secondary | ICD-10-CM | POA: Diagnosis not present

## 2023-06-22 DIAGNOSIS — M47812 Spondylosis without myelopathy or radiculopathy, cervical region: Secondary | ICD-10-CM | POA: Diagnosis not present

## 2023-06-22 DIAGNOSIS — S72002D Fracture of unspecified part of neck of left femur, subsequent encounter for closed fracture with routine healing: Secondary | ICD-10-CM | POA: Diagnosis not present

## 2023-06-22 DIAGNOSIS — S42352D Displaced comminuted fracture of shaft of humerus, left arm, subsequent encounter for fracture with routine healing: Secondary | ICD-10-CM | POA: Diagnosis not present

## 2023-06-22 DIAGNOSIS — D630 Anemia in neoplastic disease: Secondary | ICD-10-CM | POA: Diagnosis not present

## 2023-06-22 DIAGNOSIS — E782 Mixed hyperlipidemia: Secondary | ICD-10-CM | POA: Diagnosis not present

## 2023-06-22 DIAGNOSIS — D631 Anemia in chronic kidney disease: Secondary | ICD-10-CM | POA: Diagnosis not present

## 2023-06-22 DIAGNOSIS — I4819 Other persistent atrial fibrillation: Secondary | ICD-10-CM | POA: Diagnosis not present

## 2023-06-22 DIAGNOSIS — Z794 Long term (current) use of insulin: Secondary | ICD-10-CM | POA: Diagnosis not present

## 2023-06-22 DIAGNOSIS — I130 Hypertensive heart and chronic kidney disease with heart failure and stage 1 through stage 4 chronic kidney disease, or unspecified chronic kidney disease: Secondary | ICD-10-CM | POA: Diagnosis not present

## 2023-06-28 ENCOUNTER — Other Ambulatory Visit: Payer: Self-pay | Admitting: Internal Medicine

## 2023-06-28 ENCOUNTER — Other Ambulatory Visit: Payer: Self-pay | Admitting: Physician Assistant

## 2023-06-28 DIAGNOSIS — I5032 Chronic diastolic (congestive) heart failure: Secondary | ICD-10-CM | POA: Diagnosis not present

## 2023-06-28 DIAGNOSIS — I130 Hypertensive heart and chronic kidney disease with heart failure and stage 1 through stage 4 chronic kidney disease, or unspecified chronic kidney disease: Secondary | ICD-10-CM | POA: Diagnosis not present

## 2023-06-28 DIAGNOSIS — A419 Sepsis, unspecified organism: Secondary | ICD-10-CM | POA: Diagnosis not present

## 2023-06-28 DIAGNOSIS — I872 Venous insufficiency (chronic) (peripheral): Secondary | ICD-10-CM | POA: Diagnosis not present

## 2023-06-28 DIAGNOSIS — I4819 Other persistent atrial fibrillation: Secondary | ICD-10-CM | POA: Diagnosis not present

## 2023-06-28 DIAGNOSIS — S42352D Displaced comminuted fracture of shaft of humerus, left arm, subsequent encounter for fracture with routine healing: Secondary | ICD-10-CM | POA: Diagnosis not present

## 2023-06-28 DIAGNOSIS — C220 Liver cell carcinoma: Secondary | ICD-10-CM | POA: Diagnosis not present

## 2023-06-28 DIAGNOSIS — E782 Mixed hyperlipidemia: Secondary | ICD-10-CM | POA: Diagnosis not present

## 2023-06-28 DIAGNOSIS — D631 Anemia in chronic kidney disease: Secondary | ICD-10-CM | POA: Diagnosis not present

## 2023-06-28 DIAGNOSIS — J449 Chronic obstructive pulmonary disease, unspecified: Secondary | ICD-10-CM | POA: Diagnosis not present

## 2023-06-28 DIAGNOSIS — S72002D Fracture of unspecified part of neck of left femur, subsequent encounter for closed fracture with routine healing: Secondary | ICD-10-CM | POA: Diagnosis not present

## 2023-06-28 DIAGNOSIS — Z794 Long term (current) use of insulin: Secondary | ICD-10-CM | POA: Diagnosis not present

## 2023-06-28 DIAGNOSIS — M47812 Spondylosis without myelopathy or radiculopathy, cervical region: Secondary | ICD-10-CM | POA: Diagnosis not present

## 2023-06-28 DIAGNOSIS — E1122 Type 2 diabetes mellitus with diabetic chronic kidney disease: Secondary | ICD-10-CM | POA: Diagnosis not present

## 2023-06-28 DIAGNOSIS — N390 Urinary tract infection, site not specified: Secondary | ICD-10-CM | POA: Diagnosis not present

## 2023-06-28 DIAGNOSIS — D630 Anemia in neoplastic disease: Secondary | ICD-10-CM | POA: Diagnosis not present

## 2023-06-28 DIAGNOSIS — E1165 Type 2 diabetes mellitus with hyperglycemia: Secondary | ICD-10-CM | POA: Diagnosis not present

## 2023-06-28 DIAGNOSIS — N184 Chronic kidney disease, stage 4 (severe): Secondary | ICD-10-CM | POA: Diagnosis not present

## 2023-07-05 DIAGNOSIS — A419 Sepsis, unspecified organism: Secondary | ICD-10-CM | POA: Diagnosis not present

## 2023-07-05 DIAGNOSIS — E1165 Type 2 diabetes mellitus with hyperglycemia: Secondary | ICD-10-CM | POA: Diagnosis not present

## 2023-07-05 DIAGNOSIS — S42352D Displaced comminuted fracture of shaft of humerus, left arm, subsequent encounter for fracture with routine healing: Secondary | ICD-10-CM | POA: Diagnosis not present

## 2023-07-05 DIAGNOSIS — I872 Venous insufficiency (chronic) (peripheral): Secondary | ICD-10-CM | POA: Diagnosis not present

## 2023-07-05 DIAGNOSIS — I130 Hypertensive heart and chronic kidney disease with heart failure and stage 1 through stage 4 chronic kidney disease, or unspecified chronic kidney disease: Secondary | ICD-10-CM | POA: Diagnosis not present

## 2023-07-05 DIAGNOSIS — I5032 Chronic diastolic (congestive) heart failure: Secondary | ICD-10-CM | POA: Diagnosis not present

## 2023-07-05 DIAGNOSIS — I4819 Other persistent atrial fibrillation: Secondary | ICD-10-CM | POA: Diagnosis not present

## 2023-07-05 DIAGNOSIS — D631 Anemia in chronic kidney disease: Secondary | ICD-10-CM | POA: Diagnosis not present

## 2023-07-05 DIAGNOSIS — E782 Mixed hyperlipidemia: Secondary | ICD-10-CM | POA: Diagnosis not present

## 2023-07-05 DIAGNOSIS — Z794 Long term (current) use of insulin: Secondary | ICD-10-CM | POA: Diagnosis not present

## 2023-07-05 DIAGNOSIS — C220 Liver cell carcinoma: Secondary | ICD-10-CM | POA: Diagnosis not present

## 2023-07-05 DIAGNOSIS — N184 Chronic kidney disease, stage 4 (severe): Secondary | ICD-10-CM | POA: Diagnosis not present

## 2023-07-05 DIAGNOSIS — S72002D Fracture of unspecified part of neck of left femur, subsequent encounter for closed fracture with routine healing: Secondary | ICD-10-CM | POA: Diagnosis not present

## 2023-07-05 DIAGNOSIS — D630 Anemia in neoplastic disease: Secondary | ICD-10-CM | POA: Diagnosis not present

## 2023-07-05 DIAGNOSIS — N390 Urinary tract infection, site not specified: Secondary | ICD-10-CM | POA: Diagnosis not present

## 2023-07-05 DIAGNOSIS — J449 Chronic obstructive pulmonary disease, unspecified: Secondary | ICD-10-CM | POA: Diagnosis not present

## 2023-07-05 DIAGNOSIS — E1122 Type 2 diabetes mellitus with diabetic chronic kidney disease: Secondary | ICD-10-CM | POA: Diagnosis not present

## 2023-07-05 DIAGNOSIS — M47812 Spondylosis without myelopathy or radiculopathy, cervical region: Secondary | ICD-10-CM | POA: Diagnosis not present

## 2023-07-06 ENCOUNTER — Other Ambulatory Visit: Payer: Self-pay | Admitting: Internal Medicine

## 2023-07-06 ENCOUNTER — Other Ambulatory Visit: Payer: Self-pay | Admitting: Physician Assistant

## 2023-07-06 DIAGNOSIS — I480 Paroxysmal atrial fibrillation: Secondary | ICD-10-CM

## 2023-07-06 DIAGNOSIS — M10222 Drug-induced gout, left elbow: Secondary | ICD-10-CM

## 2023-07-07 NOTE — Telephone Encounter (Signed)
 Pt last saw Dr Lalla Brothers 06/15/23, last labs 02/22/23 Creat 1.27, age 69, weight 79kg, based on specified criteria pt is on appropriate dosage of Eliquis 5mg  BID for afib.  Will refill rx.

## 2023-07-11 DIAGNOSIS — I4819 Other persistent atrial fibrillation: Secondary | ICD-10-CM | POA: Diagnosis not present

## 2023-07-11 DIAGNOSIS — S42352D Displaced comminuted fracture of shaft of humerus, left arm, subsequent encounter for fracture with routine healing: Secondary | ICD-10-CM | POA: Diagnosis not present

## 2023-07-11 DIAGNOSIS — D630 Anemia in neoplastic disease: Secondary | ICD-10-CM | POA: Diagnosis not present

## 2023-07-11 DIAGNOSIS — I872 Venous insufficiency (chronic) (peripheral): Secondary | ICD-10-CM | POA: Diagnosis not present

## 2023-07-11 DIAGNOSIS — M47812 Spondylosis without myelopathy or radiculopathy, cervical region: Secondary | ICD-10-CM | POA: Diagnosis not present

## 2023-07-11 DIAGNOSIS — E1122 Type 2 diabetes mellitus with diabetic chronic kidney disease: Secondary | ICD-10-CM | POA: Diagnosis not present

## 2023-07-11 DIAGNOSIS — I130 Hypertensive heart and chronic kidney disease with heart failure and stage 1 through stage 4 chronic kidney disease, or unspecified chronic kidney disease: Secondary | ICD-10-CM | POA: Diagnosis not present

## 2023-07-11 DIAGNOSIS — Z794 Long term (current) use of insulin: Secondary | ICD-10-CM | POA: Diagnosis not present

## 2023-07-11 DIAGNOSIS — N184 Chronic kidney disease, stage 4 (severe): Secondary | ICD-10-CM | POA: Diagnosis not present

## 2023-07-11 DIAGNOSIS — D631 Anemia in chronic kidney disease: Secondary | ICD-10-CM | POA: Diagnosis not present

## 2023-07-11 DIAGNOSIS — E782 Mixed hyperlipidemia: Secondary | ICD-10-CM | POA: Diagnosis not present

## 2023-07-11 DIAGNOSIS — N390 Urinary tract infection, site not specified: Secondary | ICD-10-CM | POA: Diagnosis not present

## 2023-07-11 DIAGNOSIS — C220 Liver cell carcinoma: Secondary | ICD-10-CM | POA: Diagnosis not present

## 2023-07-11 DIAGNOSIS — I5032 Chronic diastolic (congestive) heart failure: Secondary | ICD-10-CM | POA: Diagnosis not present

## 2023-07-11 DIAGNOSIS — E1165 Type 2 diabetes mellitus with hyperglycemia: Secondary | ICD-10-CM | POA: Diagnosis not present

## 2023-07-11 DIAGNOSIS — S72002D Fracture of unspecified part of neck of left femur, subsequent encounter for closed fracture with routine healing: Secondary | ICD-10-CM | POA: Diagnosis not present

## 2023-07-11 DIAGNOSIS — J449 Chronic obstructive pulmonary disease, unspecified: Secondary | ICD-10-CM | POA: Diagnosis not present

## 2023-07-11 DIAGNOSIS — A419 Sepsis, unspecified organism: Secondary | ICD-10-CM | POA: Diagnosis not present

## 2023-07-20 ENCOUNTER — Encounter: Payer: Self-pay | Admitting: Emergency Medicine

## 2023-07-20 ENCOUNTER — Emergency Department
Admission: EM | Admit: 2023-07-20 | Discharge: 2023-07-21 | Disposition: A | Discharge status: Home / Self Care | Attending: Emergency Medicine | Admitting: Emergency Medicine

## 2023-07-20 DIAGNOSIS — N390 Urinary tract infection, site not specified: Secondary | ICD-10-CM | POA: Insufficient documentation

## 2023-07-20 DIAGNOSIS — R531 Weakness: Secondary | ICD-10-CM | POA: Insufficient documentation

## 2023-07-20 DIAGNOSIS — R000 Tachycardia, unspecified: Secondary | ICD-10-CM | POA: Diagnosis not present

## 2023-07-20 DIAGNOSIS — I4891 Unspecified atrial fibrillation: Secondary | ICD-10-CM | POA: Diagnosis not present

## 2023-07-20 LAB — TROPONIN I (HIGH SENSITIVITY)
Troponin I (High Sensitivity): 10 ng/L (ref <18)
Troponin I (High Sensitivity): 10 ng/L (ref <18)

## 2023-07-20 LAB — CBC WITH DIFFERENTIAL/PLATELET
Abs Immature Granulocytes: 0 10*3/uL (ref 0.00–0.07)
Basophils Absolute: 0 10*3/uL (ref 0.0–0.1)
Basophils Relative: 1 %
Eosinophils Absolute: 0 10*3/uL (ref 0.0–0.5)
Eosinophils Relative: 1 %
HCT: 37.5 % (ref 36.0–46.0)
Hemoglobin: 12.3 g/dL (ref 12.0–15.0)
Immature Granulocytes: 0 %
Lymphocytes Relative: 44 %
Lymphs Abs: 1.3 10*3/uL (ref 0.7–4.0)
MCH: 32.2 pg (ref 26.0–34.0)
MCHC: 32.8 g/dL (ref 30.0–36.0)
MCV: 98.2 fL (ref 80.0–100.0)
Monocytes Absolute: 0.4 10*3/uL (ref 0.1–1.0)
Monocytes Relative: 14 %
Neutro Abs: 1.2 10*3/uL — ABNORMAL LOW (ref 1.7–7.7)
Neutrophils Relative %: 40 %
Platelets: 108 10*3/uL — ABNORMAL LOW (ref 150–400)
RBC: 3.82 MIL/uL — ABNORMAL LOW (ref 3.87–5.11)
RDW: 15.8 % — ABNORMAL HIGH (ref 11.5–15.5)
WBC: 2.9 10*3/uL — ABNORMAL LOW (ref 4.0–10.5)
nRBC: 0 % (ref 0.0–0.2)

## 2023-07-20 LAB — CBG MONITORING, ED
Glucose-Capillary: 75 mg/dL (ref 70–99)
Glucose-Capillary: 74 mg/dL (ref 70–99)

## 2023-07-20 LAB — BASIC METABOLIC PANEL WITH GFR
Anion gap: 9 (ref 5–15)
BUN: 31 mg/dL — ABNORMAL HIGH (ref 8–23)
CO2: 23 mmol/L (ref 22–32)
Calcium: 8 mg/dL — ABNORMAL LOW (ref 8.9–10.3)
Chloride: 101 mmol/L (ref 98–111)
Creatinine, Ser: 1.56 mg/dL — ABNORMAL HIGH (ref 0.44–1.00)
GFR, Estimated: 36 mL/min — ABNORMAL LOW (ref >60)
Glucose, Bld: 70 mg/dL (ref 70–99)
Potassium: 3.3 mmol/L — ABNORMAL LOW (ref 3.5–5.1)
Sodium: 133 mmol/L — ABNORMAL LOW (ref 135–145)

## 2023-07-20 LAB — URINALYSIS, ROUTINE W REFLEX MICROSCOPIC
Bilirubin Urine: NEGATIVE
Glucose, UA: NEGATIVE mg/dL
Ketones, ur: NEGATIVE mg/dL
Nitrite: NEGATIVE
Protein, ur: NEGATIVE mg/dL
Specific Gravity, Urine: 1.004 — ABNORMAL LOW (ref 1.005–1.030)
pH: 6 (ref 5.0–8.0)

## 2023-07-20 MED ORDER — SODIUM CHLORIDE 0.9 % IV SOLN
1.0000 g | Freq: Once | INTRAVENOUS | Status: AC
Start: 2023-07-20 — End: 2023-07-20
  Administered 2023-07-20: 1 g via INTRAVENOUS
  Filled 2023-07-20: qty 10

## 2023-07-20 MED ORDER — CEPHALEXIN 250 MG PO CAPS: 250.0000 mg | ORAL_CAPSULE | Freq: Four times a day (QID) | ORAL | 0 refills | Status: AC

## 2023-07-20 NOTE — ED Notes (Signed)
 Clean catch sent to lab. Pt brief changed and provided w/ warm blankets and OJ per request.

## 2023-07-20 NOTE — ED Provider Notes (Signed)
 [facility] Provider Note    Event Date/Time   First MD Initiated Contact with Patient 07/20/23 2041     (approximate)   History   Weakness   HPI  Adriana Miller is a 69 y.o. female who presents to the emergency department today via EMS because of concerns for weakness and a shaking feeling.  The patient states that she noticed it this afternoon.  Felt like her sugars might be dropping because this has happened to her before when her sugars were low.  Checked her meter and her sugars are in the 50s.  She then ate some candy did not feel any significant improvement in her symptoms.  She denies any recent fevers or chills.  Does states she has noticed some discomfort with urination.     Physical Exam   Triage Vital Signs: ED Triage Vitals [07/20/23 2052]  Encounter Vitals Group     BP 133/75     Systolic BP Percentile      Diastolic BP Percentile      Pulse Rate (!) 106     Resp 16     Temp 98.6 F (37 C)     Temp Source Oral     SpO2 100 %     Weight 191 lb 4.8 oz (86.8 kg)     Height 5\' 4"  (1.626 m)     Head Circumference      Peak Flow      Pain Score 0     Pain Loc      Pain Education      Exclude from Growth Chart     Most recent vital signs: Vitals:   07/20/23 2052 07/20/23 2100  BP: 133/75 124/75  Pulse: (!) 106 (!) 108  Resp: 16 18  Temp: 98.6 F (37 C)   SpO2: 100% 99%   General: Awake, alert, oriented. CV:  Good peripheral perfusion. Irregularly irregular rhythm. Resp:  Normal effort. Lungs clear. Abd:  No distention.    ED Results / Procedures / Treatments   Labs (all labs ordered are listed, but only abnormal results are displayed) Labs Reviewed  CBC WITH DIFFERENTIAL/PLATELET - Abnormal; Notable for the following components:      Result Value   WBC 2.9 (*)    RBC 3.82 (*)    RDW 15.8 (*)    Platelets 108 (*)    Neutro Abs 1.2 (*)    All other components within normal limits  BASIC METABOLIC PANEL WITH GFR -  Abnormal; Notable for the following components:   Sodium 133 (*)    Potassium 3.3 (*)    BUN 31 (*)    Creatinine, Ser 1.56 (*)    Calcium 8.0 (*)    GFR, Estimated 36 (*)    All other components within normal limits  URINALYSIS, ROUTINE W REFLEX MICROSCOPIC - Abnormal; Notable for the following components:   Color, Urine YELLOW (*)    APPearance CLOUDY (*)    Specific Gravity, Urine 1.004 (*)    Hgb urine dipstick MODERATE (*)    Leukocytes,Ua MODERATE (*)    Bacteria, UA MANY (*)    All other components within normal limits  CBG MONITORING, ED  TROPONIN I (HIGH SENSITIVITY)  TROPONIN I (HIGH SENSITIVITY)     EKG  I, Phineas Semen, attending physician, personally viewed and interpreted this EKG  EKG Time: 2121 Rate: 115 Rhythm: atrial fibrillation Axis: normal Intervals: qtc 498 QRS: narrow ST changes: no st elevation  Impression: abnormal ekg   RADIOLOGY None   PROCEDURES:  Critical Care performed: No   MEDICATIONS ORDERED IN ED: Medications - No data to display   IMPRESSION / MDM / ASSESSMENT AND PLAN / ED COURSE  I reviewed the triage vital signs and the nursing notes.                              Differential diagnosis includes, but is not limited to, anemia, electrolyte abnormality, dehydration, infection, ACS  Patient's presentation is most consistent with acute presentation with potential threat to life or bodily function.   The patient is on the cardiac monitor to evaluate for evidence of arrhythmia and/or significant heart rate changes.  Patient presented to the emergency department today via EMS because of concerns for weakness and shaking.  On exam patient is awake and alert.  Patient is in A-fib.  Will check blood work and urine.  Workup is concerning for possible urinary tract infection.  Will give patient IV antibiotics.  Will have nursing staff ambulate. If patient feels safe with ambulation I think it would be reasonable for trial of  oral antibiotics at home.       FINAL CLINICAL IMPRESSION(S) / ED DIAGNOSES   Final diagnoses:  Weakness  Lower urinary tract infectious disease      Note:  This document was prepared using Dragon voice recognition software and may include unintentional dictation errors.    Phineas Semen, MD 07/20/23 [id number]

## 2023-07-20 NOTE — ED Triage Notes (Signed)
 Pt arrives EMS for weakness and "feeling shaky"; pt is diabetic and checked her BS at home is read 50s, pt ate some candy. Ems reports BS of 78.. ems also reports pt is in afib. Pt reports sob and right sided CP on exertion. NADN.

## 2023-07-20 NOTE — ED Notes (Signed)
 ED Provider at bedside.

## 2023-07-20 NOTE — ED Provider Notes (Signed)
  Physical Exam  BP 124/75   Pulse (!) 108   Temp 98.6 F (37 C) (Oral)   Resp 18   Ht 5\' 4"  (1.626 m)   Wt 86.8 kg   SpO2 99%   BMI 32.84 kg/m   Physical Exam  Procedures  Procedures  ED Course / MDM     Medical Decision Making Amount and/or Complexity of Data Reviewed Labs: ordered.  Risk Prescription drug management.   Received signout on patient.  68 year old female presenting for weakness.  With initial provider was slightly tachycardic but otherwise had a reassuring exam.  Mild dysuria symptoms.  Patient was given ceftriaxone.  Signed out pending repeat troponin and reevaluation.  Plan will be for ambulation and if she passes then can be discharged on oral antibiotics.  Second troponin stable.  Patient ambulated without any difficulty and feeling well at this time.  Will discharge on oral antibiotics and have her follow-up with PCP.  Given strict return precautions.   Janith Lima, MD 07/20/23 2351

## 2023-07-20 NOTE — ED Notes (Signed)
 Pt called her brother for tranportation

## 2023-07-23 LAB — URINE CULTURE: Culture: >=100000 — AB

## 2023-07-27 ENCOUNTER — Other Ambulatory Visit: Payer: Self-pay | Admitting: Internal Medicine

## 2023-07-28 ENCOUNTER — Other Ambulatory Visit: Payer: Self-pay | Admitting: Physician Assistant

## 2023-07-31 ENCOUNTER — Other Ambulatory Visit: Payer: Self-pay | Admitting: Physician Assistant

## 2023-07-31 DIAGNOSIS — F320 Major depressive disorder, single episode, mild: Secondary | ICD-10-CM

## 2023-08-01 ENCOUNTER — Telehealth: Admitting: Physician Assistant

## 2023-08-01 ENCOUNTER — Encounter: Payer: Self-pay | Admitting: Physician Assistant

## 2023-09-08 ENCOUNTER — Other Ambulatory Visit: Payer: Self-pay | Admitting: Physician Assistant

## 2023-09-17 ENCOUNTER — Other Ambulatory Visit: Payer: Self-pay | Admitting: Physician Assistant

## 2023-09-17 DIAGNOSIS — G4700 Insomnia, unspecified: Secondary | ICD-10-CM

## 2023-09-20 ENCOUNTER — Emergency Department
Admission: EM | Admit: 2023-09-20 | Discharge: 2023-09-20 | Disposition: A | Discharge status: Home / Self Care | Attending: Emergency Medicine | Admitting: Emergency Medicine

## 2023-09-20 ENCOUNTER — Encounter: Payer: Self-pay | Admitting: Emergency Medicine

## 2023-09-20 ENCOUNTER — Emergency Department

## 2023-09-20 DIAGNOSIS — R000 Tachycardia, unspecified: Secondary | ICD-10-CM | POA: Diagnosis not present

## 2023-09-20 DIAGNOSIS — Z7901 Long term (current) use of anticoagulants: Secondary | ICD-10-CM | POA: Insufficient documentation

## 2023-09-20 DIAGNOSIS — E1122 Type 2 diabetes mellitus with diabetic chronic kidney disease: Secondary | ICD-10-CM | POA: Diagnosis not present

## 2023-09-20 DIAGNOSIS — W19XXXA Unspecified fall, initial encounter: Secondary | ICD-10-CM | POA: Diagnosis not present

## 2023-09-20 DIAGNOSIS — S0990XA Unspecified injury of head, initial encounter: Secondary | ICD-10-CM | POA: Diagnosis not present

## 2023-09-20 DIAGNOSIS — I129 Hypertensive chronic kidney disease with stage 1 through stage 4 chronic kidney disease, or unspecified chronic kidney disease: Secondary | ICD-10-CM | POA: Insufficient documentation

## 2023-09-20 DIAGNOSIS — N189 Chronic kidney disease, unspecified: Secondary | ICD-10-CM | POA: Insufficient documentation

## 2023-09-20 DIAGNOSIS — M160 Bilateral primary osteoarthritis of hip: Secondary | ICD-10-CM | POA: Diagnosis not present

## 2023-09-20 DIAGNOSIS — I672 Cerebral atherosclerosis: Secondary | ICD-10-CM | POA: Diagnosis not present

## 2023-09-20 DIAGNOSIS — R197 Diarrhea, unspecified: Secondary | ICD-10-CM | POA: Insufficient documentation

## 2023-09-20 DIAGNOSIS — Y92003 Bedroom of unspecified non-institutional (private) residence as the place of occurrence of the external cause: Secondary | ICD-10-CM | POA: Insufficient documentation

## 2023-09-20 DIAGNOSIS — I6782 Cerebral ischemia: Secondary | ICD-10-CM | POA: Diagnosis not present

## 2023-09-20 DIAGNOSIS — W06XXXA Fall from bed, initial encounter: Secondary | ICD-10-CM | POA: Diagnosis not present

## 2023-09-20 DIAGNOSIS — M47816 Spondylosis without myelopathy or radiculopathy, lumbar region: Secondary | ICD-10-CM | POA: Diagnosis not present

## 2023-09-20 DIAGNOSIS — S199XXA Unspecified injury of neck, initial encounter: Secondary | ICD-10-CM | POA: Diagnosis not present

## 2023-09-20 LAB — CBC WITH DIFFERENTIAL/PLATELET
Abs Immature Granulocytes: 0.02 10*3/uL (ref 0.00–0.07)
Basophils Absolute: 0 10*3/uL (ref 0.0–0.1)
Basophils Relative: 1 %
Eosinophils Absolute: 0 10*3/uL (ref 0.0–0.5)
Eosinophils Relative: 0 %
HCT: 39.8 % (ref 36.0–46.0)
Hemoglobin: 13.5 g/dL (ref 12.0–15.0)
Immature Granulocytes: 1 %
Lymphocytes Relative: 34 %
Lymphs Abs: 1.4 10*3/uL (ref 0.7–4.0)
MCH: 34.2 pg — ABNORMAL HIGH (ref 26.0–34.0)
MCHC: 33.9 g/dL (ref 30.0–36.0)
MCV: 100.8 fL — ABNORMAL HIGH (ref 80.0–100.0)
Monocytes Absolute: 0.4 10*3/uL (ref 0.1–1.0)
Monocytes Relative: 9 %
Neutro Abs: 2.4 10*3/uL (ref 1.7–7.7)
Neutrophils Relative %: 55 %
Platelets: 109 10*3/uL — ABNORMAL LOW (ref 150–400)
RBC: 3.95 MIL/uL (ref 3.87–5.11)
RDW: 16.7 % — ABNORMAL HIGH (ref 11.5–15.5)
WBC: 4.2 10*3/uL (ref 4.0–10.5)
nRBC: 0 % (ref 0.0–0.2)

## 2023-09-20 LAB — URINALYSIS, W/ REFLEX TO CULTURE (INFECTION SUSPECTED)
Bilirubin Urine: NEGATIVE
Glucose, UA: NEGATIVE mg/dL
Ketones, ur: NEGATIVE mg/dL
Nitrite: NEGATIVE
Protein, ur: NEGATIVE mg/dL
Specific Gravity, Urine: 1.005 (ref 1.005–1.030)
pH: 5 (ref 5.0–8.0)

## 2023-09-20 LAB — BASIC METABOLIC PANEL WITH GFR
Anion gap: 11 (ref 5–15)
BUN: 11 mg/dL (ref 8–23)
CO2: 21 mmol/L — ABNORMAL LOW (ref 22–32)
Calcium: 8.4 mg/dL — ABNORMAL LOW (ref 8.9–10.3)
Chloride: 103 mmol/L (ref 98–111)
Creatinine, Ser: 1.28 mg/dL — ABNORMAL HIGH (ref 0.44–1.00)
GFR, Estimated: 45 mL/min — ABNORMAL LOW (ref >60)
Glucose, Bld: 62 mg/dL — ABNORMAL LOW (ref 70–99)
Potassium: 4.3 mmol/L (ref 3.5–5.1)
Sodium: 135 mmol/L (ref 135–145)

## 2023-09-20 MED ORDER — SODIUM CHLORIDE 0.9 % IV BOLUS
1000.0000 mL | Freq: Once | INTRAVENOUS | Status: AC
  Administered 2023-09-20: 1000 mL via INTRAVENOUS

## 2023-09-20 NOTE — Discharge Instructions (Addendum)
 Fortunately your evaluation the emergency department did not show any emergency life-threatening conditions that account for your ongoing diarrhea or your fall out of bed last night.  Call your doctor for a follow-up appointment this week.   Thank you for choosing us  for your health care today!  Please see your primary doctor this week for a follow up appointment.   If you have any new, worsening, or unexpected symptoms call your doctor right away or come back to the emergency department for reevaluation.  It was my pleasure to care for you today.   Arron Large Margery Sheets, MD

## 2023-09-20 NOTE — ED Provider Notes (Signed)
 [facility] Provider Note    Event Date/Time   First MD Initiated Contact with Patient 09/20/23 0125     (approximate)   History   Fall   HPI  Adriana Miller is a 68 y.o. female   Past medical history of alcoholic cirrhosis of liver, CKD, diabetes, hypertension hyperlipidemia, paroxysmal atrial fibrillation on Eliquis , recent urinary tract infection who presents to Emergency Department with a fall out of bed.  She was trying to get up to use the bathroom as she has been experiencing diarrhea over the last several weeks, nonbloody, and she rolled out of bed onto her buttocks.  No head strike or loss of consciousness.  Was able to ambulate on scene.  No other injuries noted.  No nausea or vomiting and no urinary symptoms.  She says she was recently treated for urinary tract infection and got antibiotics.  She feels that her diarrhea sometimes leaks out into the front and sits there and she wonders if she has a urine infection.  When asked if she has any urinary symptoms she does state some discomfort when urinating, burning sensation.  No recent travel    External Medical Documents Reviewed: Hospital notes from April when she was diagnosed with urinary tract infection, July 20, 2023       Physical Exam   Triage Vital Signs: ED Triage Vitals [09/20/23 0119]  Encounter Vitals Group     BP      Systolic BP Percentile      Diastolic BP Percentile      Pulse      Resp      Temp      Temp src      SpO2 100 %     Weight      Height      Head Circumference      Peak Flow      Pain Score      Pain Loc      Pain Education      Exclude from Growth Chart     Most recent vital signs: Vitals:   09/20/23 0400 09/20/23 0430  BP: 102/65 (!) 108/54  Pulse: (!) 105 (!) 113  Resp: 18 18  Temp:    SpO2: 97% 99%    General: Awake, no distress.  CV:  Good peripheral perfusion.  Resp:  Normal effort.  Abd:  No distention.  Other:  Pleasant woman  laying in stretcher no acute distress, mild tachycardia, dry mucous membranes looks slightly dehydrated.  Soft benign abdominal exam deep palpation all quadrants.  No signs of head trauma.  Neck supple full range of motion.  Able to range the hips bilaterally.  No bony tenderness lower extremities bilateral.  No T or L-spine tenderness or deformity.  Chest wall abdomen palpated and elicits no tenderness.   ED Results / Procedures / Treatments   Labs (all labs ordered are listed, but only abnormal results are displayed) Labs Reviewed  BASIC METABOLIC PANEL WITH GFR - Abnormal; Notable for the following components:      Result Value   CO2 21 (*)    Glucose, Bld 62 (*)    Creatinine, Ser 1.28 (*)    Calcium  8.4 (*)    GFR, Estimated 45 (*)    All other components within normal limits  CBC WITH DIFFERENTIAL/PLATELET - Abnormal; Notable for the following components:   MCV 100.8 (*)    MCH 34.2 (*)    RDW 16.7 (*)  Platelets 109 (*)    All other components within normal limits  URINALYSIS, W/ REFLEX TO CULTURE (INFECTION SUSPECTED) - Abnormal; Notable for the following components:   Color, Urine YELLOW (*)    APPearance HAZY (*)    Hgb urine dipstick LARGE (*)    Leukocytes,Ua SMALL (*)    Bacteria, UA FEW (*)    All other components within normal limits  C DIFFICILE QUICK SCREEN W PCR REFLEX    GASTROINTESTINAL PANEL BY PCR, STOOL (REPLACES STOOL CULTURE)     I ordered and reviewed the above labs they are notable for cell counts electrolytes unremarkable, urinalysis with no infectious changes, inflammatory changes.    RADIOLOGY I independently reviewed and interpreted CT head and see no obvious bleeding or midline shift I also reviewed radiologist's formal read.   PROCEDURES:  Critical Care performed: No  Procedures   MEDICATIONS ORDERED IN ED: Medications  sodium chloride  0.9 % bolus 1,000 mL (1,000 mLs Intravenous New Bag/Given 09/20/23 0324)    IMPRESSION / MDM /  ASSESSMENT AND PLAN / ED COURSE  I reviewed the triage vital signs and the nursing notes.                                Patient's presentation is most consistent with acute presentation with potential threat to life or bodily function.  Differential diagnosis includes, but is not limited to, blood traumatic injury in this fall including hip fractures, pelvic fractures, skull fracture or head bleed, C-spine injury, subacute diarrhea including infectious diarrhea, C. difficile colitis, UTI,  considered but less likely abdominal surgical infection   The patient is on the cardiac monitor to evaluate for evidence of arrhythmia and/or significant heart rate changes.  MDM:    She had a fall out of bed, minor, with buttock pain only and has been ambulatory, check x-ray of the bilateral hips and pelvis which looks negative for fracture.  No other signs of acute injury on physical exam nor by patient report but given that she is on blood thinners with a fall, check head scan and neck scan which look normal.  Given her subacute diarrhea and recent antibiotic use I consider C. difficile colitis.  She looks slightly dehydrated will give fluids.  Check GI panel and C. difficile.  Benign abdominal exam rules against surgical abdominal pathologies at this time.  Check UA  Labs pending.  Assuming no marked lab abnormalities, patient looks well enough to be discharged and follow-up with her PMD which she has scheduled appointment already.        FINAL CLINICAL IMPRESSION(S) / ED DIAGNOSES   Final diagnoses:  Fall in home, initial encounter  Diarrhea, unspecified type     Rx / DC Orders   ED Discharge Orders     None        Note:  This document was prepared using Dragon voice recognition software and may include unintentional dictation errors.    Buell Carmin, MD 09/20/23 [phone]

## 2023-09-20 NOTE — ED Triage Notes (Signed)
 Pt arrived via [facility] from home where pt fell out of bed when attempting to ambulate to bathroom. Pt reports she fell onto her left side/buttocks. EMS reports pt has day shift staff that help her but not at night. Pt was able to ambulate to front door to meet EMS when arrived. No obvious deformity noted. Pt also c/o loose stool x2 months.

## 2023-09-20 NOTE — ED Notes (Signed)
 Pt brother coming to pick pt up... approx 1 hour ETA

## 2023-09-20 NOTE — ED Notes (Signed)
 Lab was contacted refer blood draw

## 2023-09-20 NOTE — ED Notes (Signed)
 Pt complaining of Rt flank pain from a fall from the bed. No LOC. Pt takes eliquis . Also having diarrhea X 3 months

## 2023-09-26 ENCOUNTER — Emergency Department

## 2023-09-26 ENCOUNTER — Inpatient Hospital Stay
Admission: EM | Admit: 2023-09-26 | Discharge: 2023-10-03 | DRG: 638 | Disposition: A | Discharge status: Skilled Nursing Facility | Attending: Internal Medicine | Admitting: Internal Medicine

## 2023-09-26 ENCOUNTER — Other Ambulatory Visit: Payer: Self-pay

## 2023-09-26 DIAGNOSIS — E785 Hyperlipidemia, unspecified: Secondary | ICD-10-CM | POA: Diagnosis not present

## 2023-09-26 DIAGNOSIS — I130 Hypertensive heart and chronic kidney disease with heart failure and stage 1 through stage 4 chronic kidney disease, or unspecified chronic kidney disease: Secondary | ICD-10-CM | POA: Diagnosis not present

## 2023-09-26 DIAGNOSIS — Z8505 Personal history of malignant neoplasm of liver: Secondary | ICD-10-CM

## 2023-09-26 DIAGNOSIS — E11649 Type 2 diabetes mellitus with hypoglycemia without coma: Principal | ICD-10-CM | POA: Diagnosis present

## 2023-09-26 DIAGNOSIS — Z66 Do not resuscitate: Secondary | ICD-10-CM | POA: Diagnosis present

## 2023-09-26 DIAGNOSIS — Z7901 Long term (current) use of anticoagulants: Secondary | ICD-10-CM | POA: Diagnosis not present

## 2023-09-26 DIAGNOSIS — D6959 Other secondary thrombocytopenia: Secondary | ICD-10-CM | POA: Diagnosis present

## 2023-09-26 DIAGNOSIS — R000 Tachycardia, unspecified: Secondary | ICD-10-CM | POA: Diagnosis present

## 2023-09-26 DIAGNOSIS — K7030 Alcoholic cirrhosis of liver without ascites: Secondary | ICD-10-CM | POA: Diagnosis present

## 2023-09-26 DIAGNOSIS — F32A Depression, unspecified: Secondary | ICD-10-CM | POA: Diagnosis present

## 2023-09-26 DIAGNOSIS — N1831 Chronic kidney disease, stage 3a: Secondary | ICD-10-CM | POA: Diagnosis present

## 2023-09-26 DIAGNOSIS — C220 Liver cell carcinoma: Secondary | ICD-10-CM | POA: Diagnosis not present

## 2023-09-26 DIAGNOSIS — Z79899 Other long term (current) drug therapy: Secondary | ICD-10-CM | POA: Diagnosis not present

## 2023-09-26 DIAGNOSIS — D696 Thrombocytopenia, unspecified: Secondary | ICD-10-CM | POA: Diagnosis present

## 2023-09-26 DIAGNOSIS — Z794 Long term (current) use of insulin: Secondary | ICD-10-CM

## 2023-09-26 DIAGNOSIS — F17290 Nicotine dependence, other tobacco product, uncomplicated: Secondary | ICD-10-CM | POA: Diagnosis present

## 2023-09-26 DIAGNOSIS — I5032 Chronic diastolic (congestive) heart failure: Secondary | ICD-10-CM | POA: Diagnosis not present

## 2023-09-26 DIAGNOSIS — L03317 Cellulitis of buttock: Secondary | ICD-10-CM | POA: Diagnosis present

## 2023-09-26 DIAGNOSIS — F1090 Alcohol use, unspecified, uncomplicated: Secondary | ICD-10-CM | POA: Diagnosis not present

## 2023-09-26 DIAGNOSIS — I4891 Unspecified atrial fibrillation: Principal | ICD-10-CM | POA: Diagnosis present

## 2023-09-26 DIAGNOSIS — Z8249 Family history of ischemic heart disease and other diseases of the circulatory system: Secondary | ICD-10-CM | POA: Diagnosis not present

## 2023-09-26 DIAGNOSIS — Z7401 Bed confinement status: Secondary | ICD-10-CM | POA: Diagnosis not present

## 2023-09-26 DIAGNOSIS — M109 Gout, unspecified: Secondary | ICD-10-CM | POA: Diagnosis not present

## 2023-09-26 DIAGNOSIS — F1010 Alcohol abuse, uncomplicated: Secondary | ICD-10-CM | POA: Diagnosis present

## 2023-09-26 DIAGNOSIS — I499 Cardiac arrhythmia, unspecified: Secondary | ICD-10-CM | POA: Diagnosis not present

## 2023-09-26 DIAGNOSIS — I10 Essential (primary) hypertension: Secondary | ICD-10-CM | POA: Diagnosis present

## 2023-09-26 DIAGNOSIS — E1169 Type 2 diabetes mellitus with other specified complication: Secondary | ICD-10-CM

## 2023-09-26 DIAGNOSIS — E162 Hypoglycemia, unspecified: Secondary | ICD-10-CM | POA: Diagnosis not present

## 2023-09-26 DIAGNOSIS — Z8711 Personal history of peptic ulcer disease: Secondary | ICD-10-CM

## 2023-09-26 DIAGNOSIS — K766 Portal hypertension: Secondary | ICD-10-CM | POA: Diagnosis present

## 2023-09-26 DIAGNOSIS — I480 Paroxysmal atrial fibrillation: Secondary | ICD-10-CM | POA: Diagnosis present

## 2023-09-26 DIAGNOSIS — E1122 Type 2 diabetes mellitus with diabetic chronic kidney disease: Secondary | ICD-10-CM | POA: Diagnosis not present

## 2023-09-26 DIAGNOSIS — K529 Noninfective gastroenteritis and colitis, unspecified: Secondary | ICD-10-CM | POA: Diagnosis not present

## 2023-09-26 DIAGNOSIS — E1159 Type 2 diabetes mellitus with other circulatory complications: Secondary | ICD-10-CM | POA: Diagnosis present

## 2023-09-26 DIAGNOSIS — L988 Other specified disorders of the skin and subcutaneous tissue: Secondary | ICD-10-CM | POA: Diagnosis present

## 2023-09-26 DIAGNOSIS — Z886 Allergy status to analgesic agent status: Secondary | ICD-10-CM

## 2023-09-26 DIAGNOSIS — E1129 Type 2 diabetes mellitus with other diabetic kidney complication: Secondary | ICD-10-CM | POA: Diagnosis present

## 2023-09-26 DIAGNOSIS — G4700 Insomnia, unspecified: Secondary | ICD-10-CM | POA: Diagnosis not present

## 2023-09-26 DIAGNOSIS — I700 Atherosclerosis of aorta: Secondary | ICD-10-CM | POA: Diagnosis not present

## 2023-09-26 DIAGNOSIS — E663 Overweight: Secondary | ICD-10-CM | POA: Diagnosis present

## 2023-09-26 DIAGNOSIS — G9341 Metabolic encephalopathy: Secondary | ICD-10-CM | POA: Diagnosis not present

## 2023-09-26 DIAGNOSIS — R0989 Other specified symptoms and signs involving the circulatory and respiratory systems: Secondary | ICD-10-CM | POA: Diagnosis not present

## 2023-09-26 DIAGNOSIS — L03115 Cellulitis of right lower limb: Secondary | ICD-10-CM | POA: Diagnosis not present

## 2023-09-26 DIAGNOSIS — Z743 Need for continuous supervision: Secondary | ICD-10-CM | POA: Diagnosis not present

## 2023-09-26 LAB — CBG MONITORING, ED
Glucose-Capillary: 20 mg/dL — CL (ref 70–99)
Glucose-Capillary: 68 mg/dL — ABNORMAL LOW (ref 70–99)
Glucose-Capillary: 78 mg/dL (ref 70–99)
Glucose-Capillary: 64 mg/dL — ABNORMAL LOW (ref 70–99)
Glucose-Capillary: 57 mg/dL — ABNORMAL LOW (ref 70–99)
Glucose-Capillary: 117 mg/dL — ABNORMAL HIGH (ref 70–99)
Glucose-Capillary: 138 mg/dL — ABNORMAL HIGH (ref 70–99)

## 2023-09-26 LAB — PROTIME-INR
INR: 1.4 — ABNORMAL HIGH (ref 0.8–1.2)
Prothrombin Time: 17.8 s — ABNORMAL HIGH (ref 11.4–15.2)

## 2023-09-26 LAB — COMPREHENSIVE METABOLIC PANEL WITH GFR
ALT: 46 U/L — ABNORMAL HIGH (ref 0–44)
AST: 103 U/L — ABNORMAL HIGH (ref 15–41)
Albumin: 2.5 g/dL — ABNORMAL LOW (ref 3.5–5.0)
Alkaline Phosphatase: 92 U/L (ref 38–126)
Anion gap: 11 (ref 5–15)
BUN: 10 mg/dL (ref 8–23)
CO2: 21 mmol/L — ABNORMAL LOW (ref 22–32)
Calcium: 8.5 mg/dL — ABNORMAL LOW (ref 8.9–10.3)
Chloride: 104 mmol/L (ref 98–111)
Creatinine, Ser: 1.2 mg/dL — ABNORMAL HIGH (ref 0.44–1.00)
GFR, Estimated: 49 mL/min — ABNORMAL LOW (ref >60)
Glucose, Bld: 33 mg/dL — CL (ref 70–99)
Potassium: 4.5 mmol/L (ref 3.5–5.1)
Sodium: 136 mmol/L (ref 135–145)
Total Bilirubin: 1.4 mg/dL — ABNORMAL HIGH (ref 0.0–1.2)
Total Protein: 7.8 g/dL (ref 6.5–8.1)

## 2023-09-26 LAB — CBC WITH DIFFERENTIAL/PLATELET
Abs Immature Granulocytes: 0.01 10*3/uL (ref 0.00–0.07)
Basophils Absolute: 0 10*3/uL (ref 0.0–0.1)
Basophils Relative: 0 %
Eosinophils Absolute: 0 10*3/uL (ref 0.0–0.5)
Eosinophils Relative: 0 %
HCT: 43.9 % (ref 36.0–46.0)
Hemoglobin: 14.8 g/dL (ref 12.0–15.0)
Immature Granulocytes: 0 %
Lymphocytes Relative: 17 %
Lymphs Abs: 0.6 10*3/uL — ABNORMAL LOW (ref 0.7–4.0)
MCH: 33.7 pg (ref 26.0–34.0)
MCHC: 33.7 g/dL (ref 30.0–36.0)
MCV: 100 fL (ref 80.0–100.0)
Monocytes Absolute: 0.2 10*3/uL (ref 0.1–1.0)
Monocytes Relative: 5 %
Neutro Abs: 2.6 10*3/uL (ref 1.7–7.7)
Neutrophils Relative %: 78 %
Platelets: 92 10*3/uL — ABNORMAL LOW (ref 150–400)
RBC: 4.39 MIL/uL (ref 3.87–5.11)
RDW: 15.9 % — ABNORMAL HIGH (ref 11.5–15.5)
WBC: 3.4 10*3/uL — ABNORMAL LOW (ref 4.0–10.5)
nRBC: 0 % (ref 0.0–0.2)

## 2023-09-26 LAB — PHOSPHORUS: Phosphorus: 3.6 mg/dL (ref 2.5–4.6)

## 2023-09-26 LAB — BRAIN NATRIURETIC PEPTIDE: B Natriuretic Peptide: 137 pg/mL — ABNORMAL HIGH (ref 0.0–100.0)

## 2023-09-26 LAB — TSH: TSH: 1.173 u[IU]/mL (ref 0.350–4.500)

## 2023-09-26 LAB — LIPASE, BLOOD: Lipase: 44 U/L (ref 11–51)

## 2023-09-26 LAB — MAGNESIUM: Magnesium: 1.9 mg/dL (ref 1.7–2.4)

## 2023-09-26 MED ORDER — METOPROLOL TARTRATE 5 MG/5ML IV SOLN: 2.5000 mg | INTRAVENOUS | Status: DC | PRN

## 2023-09-26 MED ORDER — APIXABAN 5 MG PO TABS
5.0000 mg | ORAL_TABLET | Freq: Two times a day (BID) | ORAL | Status: DC
  Administered 2023-09-26 – 2023-10-03 (×14): 5 mg via ORAL
  Filled 2023-09-26 (×14): qty 1

## 2023-09-26 MED ORDER — METOPROLOL TARTRATE 25 MG PO TABS
25.0000 mg | ORAL_TABLET | Freq: Two times a day (BID) | ORAL | Status: DC
  Administered 2023-09-27 – 2023-10-03 (×12): 25 mg via ORAL
  Filled 2023-09-26 (×12): qty 1

## 2023-09-26 MED ORDER — IBUPROFEN 400 MG PO TABS: 200.0000 mg | ORAL_TABLET | Freq: Four times a day (QID) | ORAL | Status: DC | PRN

## 2023-09-26 MED ORDER — ALLOPURINOL 100 MG PO TABS
100.0000 mg | ORAL_TABLET | Freq: Every day | ORAL | Status: DC
  Administered 2023-09-27 – 2023-10-03 (×7): 100 mg via ORAL
  Filled 2023-09-26 (×8): qty 1

## 2023-09-26 MED ORDER — METOPROLOL TARTRATE 25 MG PO TABS
25.0000 mg | ORAL_TABLET | Freq: Once | ORAL | Status: AC
  Administered 2023-09-26: 25 mg via ORAL
  Filled 2023-09-26: qty 1

## 2023-09-26 MED ORDER — TRAZODONE HCL 50 MG PO TABS
50.0000 mg | ORAL_TABLET | Freq: Every evening | ORAL | Status: DC | PRN
  Administered 2023-09-30 – 2023-10-02 (×2): 50 mg via ORAL
  Filled 2023-09-26 (×2): qty 1

## 2023-09-26 MED ORDER — LACTATED RINGERS IV BOLUS
1000.0000 mL | Freq: Once | INTRAVENOUS | Status: AC
  Administered 2023-09-26: 1000 mL via INTRAVENOUS

## 2023-09-26 MED ORDER — SODIUM CHLORIDE 0.9 % IV SOLN
2.0000 g | Freq: Once | INTRAVENOUS | Status: AC
  Administered 2023-09-26: 2 g via INTRAVENOUS
  Filled 2023-09-26: qty 20

## 2023-09-26 MED ORDER — DEXTROSE 50 % IV SOLN
50.0000 mL | INTRAVENOUS | Status: DC | PRN
  Administered 2023-09-26 – 2023-09-28 (×3): 50 mL via INTRAVENOUS
  Filled 2023-09-26 (×2): qty 50

## 2023-09-26 MED ORDER — LOPERAMIDE HCL 2 MG PO CAPS
2.0000 mg | ORAL_CAPSULE | Freq: Two times a day (BID) | ORAL | Status: DC | PRN
  Administered 2023-09-26: 2 mg via ORAL
  Filled 2023-09-26: qty 1

## 2023-09-26 MED ORDER — METOPROLOL TARTRATE 5 MG/5ML IV SOLN
5.0000 mg | Freq: Once | INTRAVENOUS | Status: AC
  Administered 2023-09-26: 5 mg via INTRAVENOUS
  Filled 2023-09-26: qty 5

## 2023-09-26 MED ORDER — METOPROLOL TARTRATE 25 MG PO TABS: 12.5000 mg | ORAL_TABLET | Freq: Two times a day (BID) | ORAL | Status: DC

## 2023-09-26 MED ORDER — DEXTROSE 50 % IV SOLN
1.0000 | Freq: Once | INTRAVENOUS | Status: AC
  Administered 2023-09-26: 50 mL via INTRAVENOUS
  Filled 2023-09-26: qty 50

## 2023-09-26 MED ORDER — ONDANSETRON HCL 4 MG/2ML IJ SOLN: 4.0000 mg | Freq: Three times a day (TID) | INTRAMUSCULAR | Status: DC | PRN

## 2023-09-26 MED ORDER — HYDRALAZINE HCL 20 MG/ML IJ SOLN: 5.0000 mg | INTRAMUSCULAR | Status: DC | PRN

## 2023-09-26 MED ORDER — SPIRONOLACTONE 25 MG PO TABS
25.0000 mg | ORAL_TABLET | Freq: Every day | ORAL | Status: DC
  Administered 2023-09-27 – 2023-10-03 (×7): 25 mg via ORAL
  Filled 2023-09-26 (×7): qty 1

## 2023-09-26 MED ORDER — ADULT MULTIVITAMIN W/MINERALS CH
1.0000 | ORAL_TABLET | Freq: Every day | ORAL | Status: DC
  Administered 2023-09-27 – 2023-10-03 (×7): 1 via ORAL
  Filled 2023-09-26 (×7): qty 1

## 2023-09-26 MED ORDER — FUROSEMIDE 20 MG PO TABS
20.0000 mg | ORAL_TABLET | Freq: Every day | ORAL | Status: DC
  Administered 2023-09-27 – 2023-10-03 (×7): 20 mg via ORAL
  Filled 2023-09-26 (×7): qty 1

## 2023-09-26 MED ORDER — MIRTAZAPINE 15 MG PO TABS
7.5000 mg | ORAL_TABLET | Freq: Every day | ORAL | Status: DC
  Administered 2023-09-26 – 2023-10-02 (×7): 7.5 mg via ORAL
  Filled 2023-09-26 (×7): qty 1

## 2023-09-26 MED ORDER — FOLIC ACID 1 MG PO TABS
1.0000 mg | ORAL_TABLET | Freq: Every day | ORAL | Status: DC
  Administered 2023-09-27 – 2023-10-03 (×7): 1 mg via ORAL
  Filled 2023-09-26 (×7): qty 1

## 2023-09-26 MED ORDER — ESCITALOPRAM OXALATE 10 MG PO TABS
5.0000 mg | ORAL_TABLET | Freq: Every day | ORAL | Status: DC
  Administered 2023-09-27 – 2023-10-03 (×7): 5 mg via ORAL
  Filled 2023-09-26 (×7): qty 1

## 2023-09-26 MED ORDER — THIAMINE MONONITRATE 100 MG PO TABS
100.0000 mg | ORAL_TABLET | Freq: Every day | ORAL | Status: DC
  Administered 2023-09-27 – 2023-10-03 (×7): 100 mg via ORAL
  Filled 2023-09-26 (×7): qty 1

## 2023-09-26 NOTE — ED Provider Notes (Signed)
 [facility] Provider Note    Event Date/Time   First MD Initiated Contact with Patient 09/26/23 1642     (approximate)   History   Hypoglycemia   HPI  VAN EHLERT is a 68 y.o. female who presents to the ED for evaluation of Hypoglycemia   Review of cardiology clinic visit from February.  Paroxysmal A-fib on Eliquis , DM, CKD 3, cirrhosis and HCC, seizure disorder  Patient presents from home due to generalized weakness and hypoglycemia.  Apparently lives at home by herself and occasionally has caregiver or aides come visit.  Today a neighbor was visiting and patient seemed very weak.  No falls or syncope.  EMS found her with fingerstick glucose of 42.  Patient reports feeling "bad."  After she is more awake and normoglycemic she reports chronic watery diarrhea and irritation of her gluteal cleft.   Physical Exam   Triage Vital Signs: ED Triage Vitals  Encounter Vitals Group     BP      Systolic BP Percentile      Diastolic BP Percentile      Pulse      Resp      Temp      Temp src      SpO2      Weight      Height      Head Circumference      Peak Flow      Pain Score      Pain Loc      Pain Education      Exclude from Growth Chart     Most recent vital signs: Vitals:   09/26/23 1648  BP: 129/76  Pulse: (!) 125  Resp: (!) 21  SpO2: 100%    General: Awake, no distress.  CV:  Good peripheral perfusion.  Tachycardic and irregular Resp:  Normal effort.  Abd:  No distention.  Soft MSK:  No deformity noted.  Neuro:  No focal deficits appreciated. Other:  Significant skin breakdown to bilateral gluteal cleft, partial-thickness skin breakdown.  No purulence   ED Results / Procedures / Treatments   Labs (all labs ordered are listed, but only abnormal results are displayed) Labs Reviewed  COMPREHENSIVE METABOLIC PANEL WITH GFR - Abnormal; Notable for the following components:      Result Value   CO2 21 (*)    Glucose, Bld 33  (*)    Creatinine, Ser 1.20 (*)    Calcium  8.5 (*)    Albumin  2.5 (*)    AST 103 (*)    ALT 46 (*)    Total Bilirubin 1.4 (*)    GFR, Estimated 49 (*)    All other components within normal limits  CBC WITH DIFFERENTIAL/PLATELET - Abnormal; Notable for the following components:   WBC 3.4 (*)    RDW 15.9 (*)    Platelets 92 (*)    Lymphs Abs 0.6 (*)    All other components within normal limits  PROTIME-INR - Abnormal; Notable for the following components:   Prothrombin Time 17.8 (*)    INR 1.4 (*)    All other components within normal limits  CBG MONITORING, ED - Abnormal; Notable for the following components:   Glucose-Capillary 20 (*)    All other components within normal limits  CBG MONITORING, ED - Abnormal; Notable for the following components:   Glucose-Capillary 68 (*)    All other components within normal limits  LIPASE, BLOOD  MAGNESIUM   URINALYSIS, ROUTINE  W REFLEX MICROSCOPIC    EKG A-fib with RVR, rate of 145 bpm.  Normal axis and intervals.  No STEMI.  RADIOLOGY CXR interpreted by me without evidence of acute cardiopulmonary pathology.  Official radiology report(s): No results found.  PROCEDURES and INTERVENTIONS:  .1-3 Lead EKG Interpretation  Performed by: Arline Bennett, MD Authorized by: Arline Bennett, MD     Interpretation: abnormal     ECG rate:  133   ECG rate assessment: tachycardic     Rhythm: atrial fibrillation     Ectopy: none     Conduction: normal   .Critical Care  Performed by: Arline Bennett, MD Authorized by: Arline Bennett, MD   Critical care provider statement:    Critical care time (minutes):  30   Critical care time was exclusive of:  Separately billable procedures and treating other patients   Critical care was necessary to treat or prevent imminent or life-threatening deterioration of the following conditions:  Endocrine crisis   Critical care was time spent personally by me on the following activities:  Development of treatment  plan with patient or surrogate, discussions with consultants, evaluation of patient's response to treatment, examination of patient, ordering and review of laboratory studies, ordering and review of radiographic studies, ordering and performing treatments and interventions, pulse oximetry, re-evaluation of patient's condition and review of old charts   Medications  cefTRIAXone  (ROCEPHIN ) 2 g in sodium chloride  0.9 % 100 mL IVPB (2 g Intravenous New Bag/Given 09/26/23 1800)  metoprolol  tartrate (LOPRESSOR ) tablet 25 mg (has no administration in time range)  metoprolol  tartrate (LOPRESSOR ) injection 5 mg (has no administration in time range)  lactated ringers  bolus 1,000 mL (1,000 mLs Intravenous New Bag/Given 09/26/23 1700)  dextrose  50 % solution 50 mL (50 mLs Intravenous Given 09/26/23 1653)     IMPRESSION / MDM / ASSESSMENT AND PLAN / ED COURSE  I reviewed the triage vital signs and the nursing notes.  Differential diagnosis includes, but is not limited to, intentional overdose, AKI, sepsis, cellulitis  {Patient presents with symptoms of an acute illness or injury that is potentially life-threatening.  68 year old woman presents from home with generalized weakness found to be hypoglycemic with A-fib with RVR and signs of cellulitis to her gluteus.  Stable without signs of shock.  Hypoglycemia is treated IV with resumption of consciousness.  No acute focal deficits or signs of trauma.  Cellulitis and skin breakdown, started on ceftriaxone .  Provide oral and IV metoprolol  and consult with medicine for admission.  Clinical Course as of 09/26/23 1828  Mon Sep 26, 2023  1648 Reassessed.  D50 as going in. [DS]  1735 Reassessed.  Significant cellulitis and skin breakdown of the gluteal cleft bilaterally. [DS]  1824 Reassessed.  Much more lucid and awake. [DS]    Clinical Course User Index [DS] Arline Bennett, MD     FINAL CLINICAL IMPRESSION(S) / ED DIAGNOSES   Final diagnoses:  Atrial  fibrillation with RVR (HCC)  Cellulitis, gluteal  Hypoglycemia     Rx / DC Orders   ED Discharge Orders     None        Note:  This document was prepared using Dragon voice recognition software and may include unintentional dictation errors.   Arline Bennett, MD 09/26/23 [phone]

## 2023-09-26 NOTE — H&P (Addendum)
 History and Physical    Adriana Miller:[id number] DOB: [birth year] DOA: 09/26/2023  Referring MD/NP/PA:   PCP: Jacques Mattock, PA-C   Patient coming from:  The patient is coming from home.     Chief Complaint: weakness and low blood sugar  HPI: Adriana Miller is a 68 y.o. female with medical history significant of  HTN, HLD, DM, dCHF, gout, depression, CKD-3A, A-fib on Eliquis , alcoholic cirrhosis, thrombocytopenia, hepatocellular carcinoma, chronic diarrhea, alcohol  abuse, who presents with weakness and low blood sugar   Patient states that she has generalized weakness and not been feeling well in the past several days, no fall. Pt lives at home by herself and occasionally has caregiver or aides come visit.  Today a neighbor was visiting and patient seemed very weak.  EMS found her with fingerstick glucose of 42. When I saw pt in ED, she is alert oriented x 3.  No unilateral numbness or tingling in extremities. She reports chronic watery diarrhea, causing irritation to her gluteal cleft.  Denies chest pain, cough, SOB.  No nausea, vomiting, abdominal pain.  No symptoms of UTI.  Patient states that she stopped drinking alcohol  2 months ago.  Patient was found to have A-fib with RVR, heart rate 145 on EKG in ED. Her blood sugar is 33 by BMP in ED.  Data reviewed independently and ED Course: pt was found to have WBC 3.4, platelet 92, stable renal function, blood pressure 129/76, RR 21, oxygen saturation 100% on room air, temperature normal.  Chest x-ray negative.  Patient is placed in PCU for observation.   EKG: I have personally reviewed A-fib with RVR, heart rate is 145, QTc 443, low voltage, early R wave question.   Review of Systems:   General: no fevers, chills, no body weight gain,has fatigue HEENT: no blurry vision, hearing changes or sore throat Respiratory: no dyspnea, coughing, wheezing CV: no chest pain, no palpitations GI: no nausea, vomiting, abdominal pain, has  diarrhea, no constipation GU: no dysuria, burning on urination, increased urinary frequency, hematuria  Ext: no leg edema Neuro: no unilateral weakness, numbness, or tingling, no vision change or hearing loss Skin: has skin maceration in sacral and gluteal area MSK: No muscle spasm, no deformity, no limitation of range of movement in spin Heme: No easy bruising.  Travel history: No recent long distant travel.   Allergy:  Allergies  Allergen Reactions   Aspirin Other (See Comments)    Nose bleed    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)    B12 deficiency 04/05/2017   Blood in stool    Cerebral aneurysm    Chronic kidney disease    Diabetes mellitus without complication (HCC)    type 2   Gastric erosion with bleeding    Hepatocellular carcinoma (HCC)    Hyperlipidemia    Hypertension    Lower extremity cellulitis 02/08/2019   Paroxysmal A-fib (HCC)    Seizure disorder (HCC)     Past Surgical History:  Procedure Laterality Date   CARDIOVERSION N/A 12/27/2022   Procedure: CARDIOVERSION;  Surgeon: Constancia Delton, MD;  Location: [facility] ORS;  Service: Cardiovascular;  Laterality: N/A;   CEREBRAL ANEURYSM REPAIR  1991   COLONOSCOPY WITH PROPOFOL  N/A 02/12/2019   Procedure: COLONOSCOPY WITH PROPOFOL ;  Surgeon: Selena Daily, MD;  Location: [facility] ENDOSCOPY;  Service: Gastroenterology;  Laterality: N/A;   ESOPHAGOGASTRODUODENOSCOPY (EGD) WITH PROPOFOL  N/A 02/12/2019   Procedure: ESOPHAGOGASTRODUODENOSCOPY (EGD) WITH  PROPOFOL ;  Surgeon: Selena Daily, MD;  Location: [facility] ENDOSCOPY;  Service: Gastroenterology;  Laterality: N/A;   IR ANGIOGRAM SELECTIVE EACH ADDITIONAL VESSEL  09/21/2019   IR ANGIOGRAM SELECTIVE EACH ADDITIONAL VESSEL  09/21/2019   IR ANGIOGRAM SELECTIVE EACH ADDITIONAL VESSEL  09/21/2019   IR ANGIOGRAM SELECTIVE EACH ADDITIONAL VESSEL  09/21/2019   IR ANGIOGRAM SELECTIVE EACH ADDITIONAL VESSEL  10/02/2019   IR  ANGIOGRAM VISCERAL SELECTIVE  09/21/2019   IR ANGIOGRAM VISCERAL SELECTIVE  09/21/2019   IR ANGIOGRAM VISCERAL SELECTIVE  10/02/2019   IR EMBO TUMOR ORGAN ISCHEMIA INFARCT INC GUIDE ROADMAPPING  10/02/2019   IR EMBO TUMOR ORGAN ISCHEMIA INFARCT INC GUIDE ROADMAPPING  10/02/2019   IR RADIOLOGIST EVAL & MGMT  08/28/2019   IR RADIOLOGIST EVAL & MGMT  01/30/2020   IR RADIOLOGIST EVAL & MGMT  04/30/2020   IR RADIOLOGIST EVAL & MGMT  07/30/2020   IR RADIOLOGIST EVAL & MGMT  10/28/2020   IR RADIOLOGIST EVAL & MGMT  02/24/2021   IR RADIOLOGIST EVAL & MGMT  06/08/2021   IR RADIOLOGIST EVAL & MGMT  11/24/2021   IR RADIOLOGIST EVAL & MGMT  05/25/2022   IR RADIOLOGIST EVAL & MGMT  02/22/2023   IR US  GUIDE VASC ACCESS LEFT  09/21/2019   IR US  GUIDE VASC ACCESS LEFT  10/02/2019   OPEN REDUCTION INTERNAL FIXATION (ORIF) DISTAL RADIAL FRACTURE Right 08/06/2021   Procedure: OPEN REDUCTION INTERNAL FIXATION (ORIF) DISTAL RADIAL FRACTURE;  Surgeon: Molli Angelucci, MD;  Location: [facility] ORS;  Service: Orthopedics;  Laterality: Right;    Social History:  reports that she has never smoked. Her smokeless tobacco use includes snuff. She reports current alcohol  use of about 5.0 standard drinks of alcohol  per week. She reports that she does not use drugs.  Family History:  Family History  Problem Relation Age of Onset   Heart attack Brother 29     Prior to Admission medications   Medication Sig Start Date End Date Taking? Authorizing Provider  Accu-Chek Softclix Lancets lancets USE AS DIRECTED ONCE DAILY 04/06/23   McDonough, Lauren K, PA-C  acetaminophen  (TYLENOL ) 325 MG tablet Take 2 tablets (650 mg total) by mouth every 6 (six) hours as needed for mild pain (or Fever >/= 101). 06/19/22   Ezzard Holms, MD  Alcohol  Swabs (B-D SINGLE USE SWABS REGULAR) PADS USE AS DIRECTED TWICE DAILY 07/26/22   McDonough, Lauren K, PA-C  allopurinol  (ZYLOPRIM ) 100 MG tablet TAKE 1 TABLET BY MOUTH DAILY 07/07/23   McDonough, Lauren K, PA-C  apixaban   (ELIQUIS ) 5 MG TABS tablet TAKE 1 TABLET BY MOUTH TWICE  DAILY 07/07/23   Boyce Byes, MD  ascorbic acid  (VITAMIN C ) 500 MG tablet Take 1 tablet (500 mg total) by mouth daily. 03/01/23   Ree Candy, MD  escitalopram  (LEXAPRO ) 10 MG tablet TAKE 1 TABLET BY MOUTH DAILY FOR ANXIETY/SLEEP 08/01/23   McDonough, Lillie Reining, PA-C  ferrous sulfate  325 (65 FE) MG tablet Take 1 tablet (325 mg total) by mouth daily with breakfast. 02/28/23   Ree Candy, MD  folic acid  (FOLVITE ) 1 MG tablet Take 1 tablet (1 mg total) by mouth daily. 12/31/22   McDonough, Lauren K, PA-C  furosemide  (LASIX ) 20 MG tablet TAKE 1 TABLET BY MOUTH EVERY DAY 06/29/23   McDonough, Lauren K, PA-C  glucose blood (ACCU-CHEK GUIDE) test strip Use as instructed twice a day DX E11.65 08/03/22   McDonough, Lauren K, PA-C  ibuprofen (ADVIL) 200 MG tablet Take  200 mg by mouth as needed for headache or moderate pain.    [provider]  insulin  aspart (NOVOLOG ) 100 UNIT/ML injection Inject 3 Units into the skin 3 (three) times daily with meals. 02/28/23   Ree Candy, MD  insulin  glargine-yfgn (SEMGLEE ) 100 UNIT/ML injection Inject 0.12 mLs (12 Units total) into the skin daily. 03/01/23   Ree Candy, MD  Insulin  Pen Needle (BD PEN NEEDLE NANO 2ND GEN) 32G X 4 MM MISC USE AS DIRECTED WITH TOUJEO  12/31/22   McDonough, Lauren K, PA-C  metoprolol  tartrate (LOPRESSOR ) 25 MG tablet TAKE ONE-HALF TABLET BY MOUTH  TWICE DAILY 09/09/23   McDonough, Lauren K, PA-C  mirtazapine  (REMERON ) 7.5 MG tablet TAKE 1 TABLET BY MOUTH DAILY AT  BEDTIME 06/30/23   McDonough, Lauren K, PA-C  Multiple Vitamin (MULTIVITAMIN WITH MINERALS) TABS tablet Take 1 tablet by mouth daily. 03/01/23   Ree Candy, MD  polyethylene glycol (MIRALAX  / GLYCOLAX ) 17 g packet Take 17 g by mouth daily. 03/01/23   Ree Candy, MD  potassium chloride  (KLOR-CON ) 8 MEQ tablet TAKE 1 TABLET BY MOUTH 3 TIMES WEEKLY ON MONDAY, WEDNESDAY, AND  FRIDAY 06/29/23   End, Veryl Gottron, MD  spironolactone  (ALDACTONE ) 25 MG tablet TAKE 1 TABLET BY MOUTH DAILY FOR CIRRHOSIS OF LIVER 05/23/23   McDonough, Lauren K, PA-C  thiamine  (VITAMIN B-1) 100 MG tablet Take 1 tablet (100 mg total) by mouth daily. 01/04/23   McDonough, Lillie Reining, PA-C  traZODone  (DESYREL ) 50 MG tablet TAKE 1 TO 2 TABLETS BY MOUTH AT  BEDTIME FOR INSOMNIA 09/19/23   Jacques Mattock, PA-C    Physical Exam: Vitals:   09/26/23 1651 09/26/23 1700 09/26/23 1845 09/26/23 1856  BP:  (!) 131/117    Pulse:  (!) 110 90   Resp:  (!) 26 20   Temp:    98.4 F (36.9 C)  TempSrc:    Oral  SpO2:  99% 100%   Weight: 77.6 kg     Height: 5\' 6"  (1.676 m)      General: Not in acute distress HEENT:       Eyes: PERRL, EOMI, no jaundice       ENT: No discharge from the ears and nose, no pharynx injection, no tonsillar enlargement.        Neck: No JVD, no bruit, no mass felt. Heme: No neck lymph node enlargement. Cardiac: S1/S2, irregularly irregular rhythm, no murmurs, No gallops or rubs. Respiratory: No rales, wheezing, rhonchi or rubs. GI: Soft, nondistended, nontender, no rebound pain, no organomegaly, BS present. GU: No hematuria Ext: No pitting leg edema bilaterally. 1+DP/PT pulse bilaterally. Musculoskeletal: No joint deformities, No joint redness or warmth, no limitation of ROM in spin. Skin: has skin maceration and skin tear in sacral and gluteal areas    Neuro: Alert, oriented X3, cranial nerves II-XII grossly intact, moves all extremities normally.  Psych: Patient is not psychotic, no suicidal or hemocidal ideation.  Labs on Admission: I have personally reviewed following labs and imaging studies  CBC: Recent Labs  Lab 09/20/23 0538 09/26/23 1651  WBC 4.2 3.4*  NEUTROABS 2.4 2.6  HGB 13.5 14.8  HCT 39.8 43.9  MCV 100.8* 100.0  PLT 109* 92*   Basic Metabolic Panel: Recent Labs  Lab 09/20/23 0538 09/26/23 1651  NA 135 136  K 4.3 4.5  CL 103 104  CO2 21*  21*  GLUCOSE 62* 33*  BUN 11 10  CREATININE 1.28* 1.20*  CALCIUM  8.4*  8.5*  MG  --  1.9   GFR: Estimated Creatinine Clearance: 46.5 mL/min (A) (by C-G formula based on SCr of 1.2 mg/dL (H)). Liver Function Tests: Recent Labs  Lab 09/26/23 1651  AST 103*  ALT 46*  ALKPHOS 92  BILITOT 1.4*  PROT 7.8  ALBUMIN  2.5*   Recent Labs  Lab 09/26/23 1651  LIPASE 44   No results for input(s): "AMMONIA" in the last 168 hours. Coagulation Profile: Recent Labs  Lab 09/26/23 1651  INR 1.4*   Cardiac Enzymes: No results for input(s): "CKTOTAL", "CKMB", "CKMBINDEX", "TROPONINI" in the last 168 hours. BNP (last 3 results) No results for input(s): "PROBNP" in the last 8760 hours. HbA1C: No results for input(s): "HGBA1C" in the last 72 hours. CBG: Recent Labs  Lab 09/26/23 1721 09/26/23 1829 09/26/23 1927 09/26/23 2009 09/26/23 2046  GLUCAP 68* 78 64* 57* 117*   Lipid Profile: No results for input(s): "CHOL", "HDL", "LDLCALC", "TRIG", "CHOLHDL", "LDLDIRECT" in the last 72 hours. Thyroid  Function Tests: No results for input(s): "TSH", "T4TOTAL", "FREET4", "T3FREE", "THYROIDAB" in the last 72 hours. Anemia Panel: No results for input(s): "VITAMINB12", "FOLATE", "FERRITIN", "TIBC", "IRON ", "RETICCTPCT" in the last 72 hours. Urine analysis:    Component Value Date/Time   COLORURINE YELLOW (A) 09/20/2023 0539   APPEARANCEUR HAZY (A) 09/20/2023 0539   APPEARANCEUR Hazy 10/11/2013 1705   LABSPEC 1.005 09/20/2023 0539   LABSPEC 1.012 10/11/2013 1705   PHURINE 5.0 09/20/2023 0539   GLUCOSEU NEGATIVE 09/20/2023 0539   GLUCOSEU >=500 10/11/2013 1705   HGBUR LARGE (A) 09/20/2023 0539   BILIRUBINUR NEGATIVE 09/20/2023 0539   BILIRUBINUR Negative 10/11/2013 1705   KETONESUR NEGATIVE 09/20/2023 0539   PROTEINUR NEGATIVE 09/20/2023 0539   NITRITE NEGATIVE 09/20/2023 0539   LEUKOCYTESUR SMALL (A) 09/20/2023 0539   LEUKOCYTESUR 1+ 10/11/2013 1705   Sepsis  Labs: @LABRCNTIP (procalcitonin:4,lacticidven:4) )No results found for this or any previous visit (from the past 240 hours).   Radiological Exams on Admission:   Assessment/Plan Principal Problem:   Atrial fibrillation with RVR (HCC) Active Problems:   Hypoglycemia   Type II diabetes mellitus with renal manifestations (HCC)   Chronic diarrhea   Alcoholic cirrhosis of liver without ascites (HCC)   Alcohol  use disorder   Essential hypertension   Chronic kidney disease, stage 3a (HCC)   Thrombocytopenia (HCC)   Chronic heart failure with preserved ejection fraction (HFpEF) (HCC)   Depression   Maceration of skin   Overweight (BMI 25.0-29.9)   Assessment and Plan:   Atrial fibrillation with RVR [facility]): Initial heart rate 145 on EKG. patient was given 5 mg of IV metoprolol  and 25 mg of oral metoprolol , heart rate improved to 90-100s.  -Place in PCU for observation - Increased home metoprolol  dose from 12.5 mg to 25 mg twice daily - As needed metoprolol  2.5 mg for heart rate> 125, every 2 hour -Continue home Eliquis  5 mg twice daily - Check TSH  Hypoglycemia: -Check CBG every 2 hours - As needed D50 - Hold insulin   Type II diabetes mellitus with renal manifestations (HCC): Recent A1c 5.2, well-controlled.  Patient said taking NovoLog  and Toujeo  unit at bedtime. - Hold insulin  due to hypoglycemia  Chronic diarrhea: This is chronic issue, no abdominal pain, nausea vomiting, low suspicions for C. difficile. -Started Imodium as needed  Alcoholic cirrhosis of liver without ascites (HCC): Mental status normal. - Check ammonia level  Alcohol  use disorder: Patient states that last drink was 2 months ago.  Currently no signs for withdrawal -  Observe closely - Give vitamin B1 and folic acid   Essential hypertension -IV hydralazine  as needed - Patient is on metoprolol , Lasix  and spironolactone   Chronic kidney disease, stage 3a (HCC): Renal function stable.  Baseline creatinine  1.2-1.4.  Her creatinine is 1.20, GFR 49, BUN 10 today - Follow-up with CBC  Thrombocytopenia (HCC): This is chronic issue, possibly related to liver cirrhosis.  Platelet 92, no active bleeding. -Follow-up with CBC  Chronic heart failure with preserved ejection fraction (HFpEF) (HCC): 2D echo on 08/22/2022 showed EF of 55 to 60%.  Patient does not have leg edema or JVD.  CHF seem to be compensated. -Check BNP - Continue home Lasix , spironolactone   Depression -Continue home medication  Maceration of skin: Patient does not have fever or leukocytosis.  Patient received 1 dose of Rocephin  in ED, will hold off antibiotics now. -Wound care consult - Follow-up blood culture which is ordered by EDP.  Overweight (BMI 25.0-29.9): Body weight 77.6, BMI 27.60 - Encourage losing weight - Exercise healthy diet       DVT ppx: on Eliquis   Code Status: DNR per pt  Family Communication:     not done, no family member is at bed side.      Disposition Plan:  Anticipate discharge back to previous environment  Consults called:  none  Admission status and Level of care: Progressive:    for obs       Dispo: The patient is from: Home              Anticipated d/c is to: Home              Anticipated d/c date is: 1 day              Patient currently is not medically stable to d/c.    Severity of Illness:  The appropriate patient status for this patient is OBSERVATION. Observation status is judged to be reasonable and necessary in order to provide the required intensity of service to ensure the patient's safety. The patient's presenting symptoms, physical exam findings, and initial radiographic and laboratory data in the context of their medical condition is felt to place them at decreased risk for further clinical deterioration. Furthermore, it is anticipated that the patient will be medically stable for discharge from the hospital within 2 midnights of admission.        Date of Service  09/26/2023    Fidencio Hue [facility]   If 7PM-7AM, please contact night-coverage [email or web address] 09/26/2023, 9:50 PM

## 2023-09-26 NOTE — ED Notes (Signed)
 Report given to [facility].

## 2023-09-26 NOTE — ED Notes (Signed)
Family called and updated on pt status.

## 2023-09-26 NOTE — ED Notes (Signed)
 BGL check, 64. Pt provided with food, will monitor after eating. Pt A&Ox4.

## 2023-09-26 NOTE — ED Triage Notes (Signed)
 Pt arrives via EMS from home due to hypoglycemia. Per EMS pt called 911 due to being weak. Per pt neighbor pt speech is different then usual. EMS sts that they found pt BGL to be 42. Pt is A/Ox1 at this time.

## 2023-09-27 DIAGNOSIS — Z886 Allergy status to analgesic agent status: Secondary | ICD-10-CM | POA: Diagnosis not present

## 2023-09-27 DIAGNOSIS — I5032 Chronic diastolic (congestive) heart failure: Secondary | ICD-10-CM | POA: Diagnosis present

## 2023-09-27 DIAGNOSIS — Z794 Long term (current) use of insulin: Secondary | ICD-10-CM | POA: Diagnosis not present

## 2023-09-27 DIAGNOSIS — Z8505 Personal history of malignant neoplasm of liver: Secondary | ICD-10-CM | POA: Diagnosis not present

## 2023-09-27 DIAGNOSIS — L03317 Cellulitis of buttock: Secondary | ICD-10-CM | POA: Diagnosis present

## 2023-09-27 DIAGNOSIS — F1010 Alcohol abuse, uncomplicated: Secondary | ICD-10-CM | POA: Diagnosis present

## 2023-09-27 DIAGNOSIS — E663 Overweight: Secondary | ICD-10-CM | POA: Diagnosis present

## 2023-09-27 DIAGNOSIS — R000 Tachycardia, unspecified: Secondary | ICD-10-CM | POA: Diagnosis present

## 2023-09-27 DIAGNOSIS — E785 Hyperlipidemia, unspecified: Secondary | ICD-10-CM | POA: Diagnosis present

## 2023-09-27 DIAGNOSIS — K529 Noninfective gastroenteritis and colitis, unspecified: Secondary | ICD-10-CM | POA: Diagnosis present

## 2023-09-27 DIAGNOSIS — Z7901 Long term (current) use of anticoagulants: Secondary | ICD-10-CM | POA: Diagnosis not present

## 2023-09-27 DIAGNOSIS — Z8249 Family history of ischemic heart disease and other diseases of the circulatory system: Secondary | ICD-10-CM | POA: Diagnosis not present

## 2023-09-27 DIAGNOSIS — Z66 Do not resuscitate: Secondary | ICD-10-CM | POA: Diagnosis present

## 2023-09-27 DIAGNOSIS — E11649 Type 2 diabetes mellitus with hypoglycemia without coma: Secondary | ICD-10-CM | POA: Diagnosis present

## 2023-09-27 DIAGNOSIS — N1831 Chronic kidney disease, stage 3a: Secondary | ICD-10-CM | POA: Diagnosis present

## 2023-09-27 DIAGNOSIS — D6959 Other secondary thrombocytopenia: Secondary | ICD-10-CM | POA: Diagnosis present

## 2023-09-27 DIAGNOSIS — I480 Paroxysmal atrial fibrillation: Secondary | ICD-10-CM | POA: Diagnosis present

## 2023-09-27 DIAGNOSIS — K766 Portal hypertension: Secondary | ICD-10-CM | POA: Diagnosis present

## 2023-09-27 DIAGNOSIS — F32A Depression, unspecified: Secondary | ICD-10-CM | POA: Diagnosis present

## 2023-09-27 DIAGNOSIS — I130 Hypertensive heart and chronic kidney disease with heart failure and stage 1 through stage 4 chronic kidney disease, or unspecified chronic kidney disease: Secondary | ICD-10-CM | POA: Diagnosis present

## 2023-09-27 DIAGNOSIS — F17290 Nicotine dependence, other tobacco product, uncomplicated: Secondary | ICD-10-CM | POA: Diagnosis present

## 2023-09-27 DIAGNOSIS — E1122 Type 2 diabetes mellitus with diabetic chronic kidney disease: Secondary | ICD-10-CM | POA: Diagnosis present

## 2023-09-27 DIAGNOSIS — Z79899 Other long term (current) drug therapy: Secondary | ICD-10-CM | POA: Diagnosis not present

## 2023-09-27 DIAGNOSIS — E162 Hypoglycemia, unspecified: Secondary | ICD-10-CM | POA: Diagnosis present

## 2023-09-27 DIAGNOSIS — I4891 Unspecified atrial fibrillation: Secondary | ICD-10-CM | POA: Diagnosis not present

## 2023-09-27 DIAGNOSIS — K7030 Alcoholic cirrhosis of liver without ascites: Secondary | ICD-10-CM | POA: Diagnosis present

## 2023-09-27 LAB — GLUCOSE, CAPILLARY
Glucose-Capillary: 128 mg/dL — ABNORMAL HIGH (ref 70–99)
Glucose-Capillary: 132 mg/dL — ABNORMAL HIGH (ref 70–99)
Glucose-Capillary: 109 mg/dL — ABNORMAL HIGH (ref 70–99)

## 2023-09-27 LAB — APTT: aPTT: 32 s (ref 24–36)

## 2023-09-27 LAB — CBC
HCT: 36.3 % (ref 36.0–46.0)
Hemoglobin: 12.2 g/dL (ref 12.0–15.0)
MCH: 33.9 pg (ref 26.0–34.0)
MCHC: 33.6 g/dL (ref 30.0–36.0)
MCV: 100.8 fL — ABNORMAL HIGH (ref 80.0–100.0)
Platelets: 73 10*3/uL — ABNORMAL LOW (ref 150–400)
RBC: 3.6 MIL/uL — ABNORMAL LOW (ref 3.87–5.11)
RDW: 16.1 % — ABNORMAL HIGH (ref 11.5–15.5)
WBC: 2.6 10*3/uL — ABNORMAL LOW (ref 4.0–10.5)
nRBC: 0 % (ref 0.0–0.2)

## 2023-09-27 LAB — BASIC METABOLIC PANEL WITH GFR
Anion gap: 8 (ref 5–15)
BUN: 9 mg/dL (ref 8–23)
CO2: 21 mmol/L — ABNORMAL LOW (ref 22–32)
Calcium: 7.9 mg/dL — ABNORMAL LOW (ref 8.9–10.3)
Chloride: 106 mmol/L (ref 98–111)
Creatinine, Ser: 1.07 mg/dL — ABNORMAL HIGH (ref 0.44–1.00)
GFR, Estimated: 56 mL/min — ABNORMAL LOW (ref >60)
Glucose, Bld: 61 mg/dL — ABNORMAL LOW (ref 70–99)
Potassium: 4 mmol/L (ref 3.5–5.1)
Sodium: 135 mmol/L (ref 135–145)

## 2023-09-27 LAB — CBG MONITORING, ED
Glucose-Capillary: 131 mg/dL — ABNORMAL HIGH (ref 70–99)
Glucose-Capillary: 56 mg/dL — ABNORMAL LOW (ref 70–99)
Glucose-Capillary: 57 mg/dL — ABNORMAL LOW (ref 70–99)
Glucose-Capillary: 63 mg/dL — ABNORMAL LOW (ref 70–99)
Glucose-Capillary: 90 mg/dL (ref 70–99)
Glucose-Capillary: 95 mg/dL (ref 70–99)

## 2023-09-27 LAB — URINALYSIS, ROUTINE W REFLEX MICROSCOPIC
Bilirubin Urine: NEGATIVE
Glucose, UA: NEGATIVE mg/dL
Hgb urine dipstick: NEGATIVE
Ketones, ur: NEGATIVE mg/dL
Leukocytes,Ua: NEGATIVE
Nitrite: NEGATIVE
Protein, ur: NEGATIVE mg/dL
Specific Gravity, Urine: 1.005 (ref 1.005–1.030)
pH: 5 (ref 5.0–8.0)

## 2023-09-27 LAB — AMMONIA: Ammonia: 45 umol/L — ABNORMAL HIGH (ref 9–35)

## 2023-09-27 LAB — HEMOGLOBIN A1C
Hgb A1c MFr Bld: 4.9 % (ref 4.8–5.6)
Mean Plasma Glucose: 94 mg/dL

## 2023-09-27 LAB — HIV ANTIBODY (ROUTINE TESTING W REFLEX): HIV Screen 4th Generation wRfx: NONREACTIVE

## 2023-09-27 MED ORDER — ZINC OXIDE 40 % EX OINT
TOPICAL_OINTMENT | Freq: Three times a day (TID) | CUTANEOUS | Status: DC
  Filled 2023-09-27 (×2): qty 113

## 2023-09-27 NOTE — Care Management Obs Status (Signed)
 MEDICARE OBSERVATION STATUS NOTIFICATION   Patient Details  Name: Adriana Miller MRN: [id number] Date of Birth: 1956-02-06   Medicare Observation Status Notification Given:       Anise Kerns 09/27/2023, 11:46 AM

## 2023-09-27 NOTE — ED Notes (Signed)
 Request made for transport to the floor ?

## 2023-09-27 NOTE — Progress Notes (Signed)
 Progress Note   Patient: Adriana Miller WGN:[id number] DOB: November 04, 1955 DOA: 09/26/2023     0 DOS: the patient was seen and examined on 09/27/2023   Brief hospital course:  CASSANDRA HARBOLD is a 68 y.o. female with medical history significant of  HTN, HLD, DM, dCHF, gout, depression, CKD-3A, A-fib on Eliquis , alcoholic cirrhosis, thrombocytopenia, hepatocellular carcinoma, chronic diarrhea, alcohol  abuse, who presents with weakness and low blood sugar   Patient states that she has generalized weakness and not been feeling well in the past several days, no fall. Pt lives at home by herself and occasionally has caregiver or aides come visit.  Today a neighbor was visiting and patient seemed very weak.  EMS found her with fingerstick glucose of 42. When I saw pt in ED, she is alert oriented x 3.  No unilateral numbness or tingling in extremities. She reports chronic watery diarrhea, causing irritation to her gluteal cleft.  Denies chest pain, cough, SOB.  No nausea, vomiting, abdominal pain.  No symptoms of UTI.  Patient states that she stopped drinking alcohol  2 months ago.   Patient was found to have A-fib with RVR, heart rate 145 on EKG in ED. Her blood sugar is 33 by BMP in ED.   Data reviewed independently and ED Course: pt was found to have WBC 3.4, platelet 92, stable renal function, blood pressure 129/76, RR 21, oxygen saturation 100% on room air, temperature normal.  Chest x-ray negative.  Patient is placed in PCU for observation.     EKG: I have personally reviewed A-fib with RVR, heart rate is 145, QTc 443, low voltage, early R wave question.     Assessment and Plan:  Atrial fibrillation with RVR (HCC): Improved Continue metoprolol  25 mg twice daily Continue Eliquis  as primary prophylaxis for an acute stroke TSH is within normal limits    Hypoglycemia: Hold insulin  Hemoglobin A1c levels pending Encourage oral intake Continue blood sugar checks Q2 and if stable will change AC  and at bedtime    Type II diabetes mellitus with renal manifestations (HCC):  Admitted for hypoglycemia Hold insulin  Encourage oral intake     Chronic diarrhea:  This is chronic issue, no abdominal pain, nausea vomiting, low suspicions for C. difficile. -Started Imodium as needed    Alcoholic cirrhosis of liver without ascites (HCC):  Mental status within normal limits Continue spironolactone  and furosemide     Alcohol  use disorder: Patient states that last drink was 2 months ago.   Currently no signs for withdrawal -Observe closely - Give vitamin B1 and folic acid    Essential hypertension - Continue metoprolol , Lasix  and spironolactone    Chronic kidney disease, stage 3a (HCC): Renal function stable.  Baseline creatinine 1.2-1.4.  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   Patient does not have leg edema or JVD.  CHF seem to be compensated - Continue home Lasix , spironolactone     Depression -Continue home medication   Maceration of skin:  Patient does not have fever or leukocytosis.   Patient received 1 dose of Rocephin  in ED, will hold off antibiotics now. -Wound care consult for moisture of associated dermatitis             Subjective: No new complaints.  Eating  breakfast  Physical Exam: Vitals:   09/27/23 0500 09/27/23 0630 09/27/23 0730 09/27/23 1056  BP: (!) 105/51 105/69  (!) 112/98  Pulse: 92 (!) 106 (!) 115 84  Resp: (!) 24 (!) 23 19   Temp:  98.9 F (37.2 C)  98.1 F (36.7 C)  TempSrc:  Oral    SpO2: 96% 100% 100% 100%  Weight:      Height:       General: Not in acute distress HEENT:       Eyes: PERRL, EOMI, no jaundice       ENT: No discharge from the ears and nose, no pharynx  injection, no tonsillar enlargement.        Neck: No JVD, no bruit, no mass felt. Heme: No neck lymph node enlargement. Cardiac: S1/S2, irregularly irregular rhythm, no murmurs, No gallops or rubs. Respiratory: No rales, wheezing, rhonchi or rubs. GI: Soft, nondistended, nontender, no rebound pain, no organomegaly, BS present. GU: No hematuria Ext: No pitting leg edema bilaterally. 1+DP/PT pulse bilaterally. Musculoskeletal: No joint deformities, No joint redness or warmth, no limitation of ROM in spin. Skin: has skin maceration and skin tear in sacral and gluteal areas      Neuro: Alert, oriented X3, cranial nerves II-XII grossly intact, moves all extremities normally.  Psych: Patient is not psychotic, no suicidal or hemocidal ideation.      Data Reviewed: Glucose 61, creatinine 1.07 Labs reviewed  Family Communication: Plan of care was discussed with patient at the bedside.  She verbalizes understanding and agrees with the plan.  Disposition: Status is: Observation The patient remains OBS appropriate and will d/c before 2 midnights.  Planned Discharge Destination: TBD    Time spent: 40  minutes  Author: Read Camel, MD 09/27/2023 2:16 PM  For on call review [email or web address].

## 2023-09-27 NOTE — Inpatient Diabetes Management (Addendum)
 Inpatient Diabetes Program Recommendations  AACE/ADA: New Consensus Statement on Inpatient Glycemic Control (2015)  Target Ranges:  Prepandial:   less than 140 mg/dL      Peak postprandial:   less than 180 mg/dL (1-2 hours)      Critically ill patients:  140 - 180 mg/dL   Lab Results  Component Value Date   GLUCAP 90 09/27/2023   HGBA1C 5.2 12/22/2022    Review of Glycemic Control  Latest Reference Range & Units 09/26/23 22:10 09/27/23 00:24 09/27/23 02:05 09/27/23 03:25 09/27/23 05:55 09/27/23 06:29 09/27/23 07:30  Glucose-Capillary 70 - 99 mg/dL 454 (H) 95 56 (L) 098 (H) 57 (L) 63 (L) 90  (H): Data is abnormally high (L): Data is abnormally low  Latest Reference Range & Units 09/26/23 16:43 09/26/23 17:21 09/26/23 18:29 09/26/23 19:27 09/26/23 20:09 09/26/23 20:46  Glucose-Capillary 70 - 99 mg/dL 20 (LL) 68 (L) 78 64 (L) 57 (L) 117 (H)  (LL): Data is critically low (L): Data is abnormally low (H): Data is abnormally high  Diabetes history: DM2 Outpatient Diabetes medications: Unsure Current orders for Inpatient glycemic control: CBG's  Met with patient briefly at bedside.  She states she takes 10 units of insulin  every day and a little pink pill.  She presents to RD with hypoglycemia.  She states most mornings she wakes up and is 40-50 mg/dL.  She treats with juice.  She wears a CGM and uses a reader with her CGM. She does not know which CGM she wears.  She states her adie places the CGM for her.  It had fallen off prior to this hospitalization.  She states she has a glucometer at home as well but she does not check her CBGs.  Explained that if her CGM comes off she needs to check her glucose using her glucometer.    I have a call placed in with [facility] to find out exactly which DM medications she takes at home.    Last A1C was 5.2 on 12/22/22-Might consider obtaining a current A1C.  Will need adjustments to DM medications at discharge.    Addendum@12 :45:  [facility] discharged  her 3 months ago.   Thank you, Hays Lipschutz, MSN, CDCES Diabetes Coordinator Inpatient Diabetes Program [phone] (team pager from 8a-5p)

## 2023-09-27 NOTE — Evaluation (Signed)
 Physical Therapy Evaluation Patient Details Name: Adriana Miller MRN: [id number] DOB: 1955-09-10 Today's Date: 09/27/2023  History of Present Illness  Adriana Miller is a 68 y.o. female with medical history significant of  HTN, HLD, DM, dCHF, gout, depression, CKD-3A, A-fib on Eliquis , alcoholic cirrhosis, thrombocytopenia, hepatocellular carcinoma, chronic diarrhea, alcohol  abuse, who presents with weakness and low blood sugar  Clinical Impression  Pt is a pleasant 68 year old female who was admitted for weakness and low blood sugar. Pt performs bed mobility with min A and HOB raised, requiring verbal and tactile cues on placement of limbs to facilitate movement. STS transfers were performed with RW and CGA, again requiring verbal cueing on use of AD and initiation of movement. Transfer to recliner was performed with CGA and cueing on lateral progression of limbs, no LOB experienced. Ambulation this date deferred this date d/t pt fatigue. Pt demonstrates deficits with strength, activity tolerance, and generalized safety awareness. Pt is hopeful on returning home at d/c but it is clear pt is not at baseline and would benefit from additional help at d/c time.  Pt would benefit from skilled PT interventions to address deficits noted above and to goals. PT to follow acutely as appropriate.        If plan is discharge home, recommend the following: A lot of help with walking and/or transfers;A little help with bathing/dressing/bathroom;Assistance with cooking/housework;Assist for transportation;Help with stairs or ramp for entrance   Can travel by private vehicle   Yes    Equipment Recommendations None recommended by PT  Recommendations for Other Services       Functional Status Assessment Patient has had a recent decline in their functional status and demonstrates the ability to make significant improvements in function in a reasonable and predictable amount of time.     Precautions /  Restrictions Precautions Precautions: Fall Recall of Precautions/Restrictions: Impaired Restrictions Weight Bearing Restrictions Per Provider Order: No      Mobility  Bed Mobility Overal bed mobility: Needs Assistance Bed Mobility: Supine to Sit     Supine to sit: Min assist     General bed mobility comments: verbal and tactile cueing on placement of UE and LE, HOB raised and use of rails    Transfers Overall transfer level: Needs assistance   Transfers: Sit to/from Stand, Bed to chair/wheelchair/BSC Sit to Stand: Contact guard assist   Step pivot transfers: Contact guard assist       General transfer comment: verbal and tactile cues given for use of RW and general limb placement, no LOB experienced but pt stating she feels weaker than normal    Ambulation/Gait               General Gait Details: ambulation deferred this date d/t pt feeling weak  Stairs            Wheelchair Mobility     Tilt Bed    Modified Rankin (Stroke Patients Only)       Balance Overall balance assessment: Needs assistance Sitting-balance support: Single extremity supported, Feet supported Sitting balance-Leahy Scale: Fair     Standing balance support: Bilateral upper extremity supported Standing balance-Leahy Scale: Fair Standing balance comment: use of RW, no LOB experienced                             Pertinent Vitals/Pain Pain Assessment Pain Assessment: No/denies pain    Home Living Family/patient expects to be discharged  to:: Private residence Living Arrangements: Alone Available Help at Discharge: Personal care attendant;Available PRN/intermittently Type of Home: Apartment Home Access: Level entry       Home Layout: One level Home Equipment: Shower seat;Grab bars - toilet;Cane - single point;Rollator (4 wheels);Grab bars - tub/shower      Prior Function Prior Level of Function : Needs assist;History of Falls (last six months)        Physical Assist : Mobility (physical);ADLs (physical) Mobility (physical): Bed mobility;Transfers;Gait ADLs (physical): Dressing;Bathing;Feeding Mobility Comments: Pt reports using SPC or rollator for mobility ADLs Comments: per pt report, home health aide assists with bathing, cleaning, dressing (especially putting on her socks), and food prep; she gets Meals on Wheels, and is not driving. Aide comes 7x/wk for 1-2 hours.     Extremity/Trunk Assessment   Upper Extremity Assessment Upper Extremity Assessment: LUE deficits/detail LUE Deficits / Details: remote healing fx of L prox.humerus 6 mo ago    Lower Extremity Assessment Lower Extremity Assessment: Generalized weakness    Cervical / Trunk Assessment Cervical / Trunk Assessment: Kyphotic  Communication   Communication Communication: No apparent difficulties    Cognition Arousal: Alert Behavior During Therapy: WFL for tasks assessed/performed   PT - Cognitive impairments: Safety/Judgement                       PT - Cognition Comments: pt did not think she needed RW/assistance getting up from chair, reinforcement required Following commands: Intact       Cueing Cueing Techniques: Verbal cues, Tactile cues     General Comments General comments (skin integrity, edema, etc.): asked about sacral and gluteal skin tears and stated it felt better after it was cleansed    Exercises     Assessment/Plan    PT Assessment Patient needs continued PT services  PT Problem List Decreased strength;Decreased range of motion;Decreased activity tolerance;Decreased balance;Decreased mobility;Decreased knowledge of use of DME;Decreased safety awareness       PT Treatment Interventions DME instruction;Gait training;Functional mobility training;Therapeutic activities;Therapeutic exercise;Balance training;Patient/family education    PT Goals (Current goals can be found in the Care Plan section)  Acute Rehab PT Goals Patient  Stated Goal: to improve mobility and return to PLOF PT Goal Formulation: With patient Time For Goal Achievement: 10/11/23 Potential to Achieve Goals: Good    Frequency Min 2X/week     Co-evaluation               AM-PAC PT "6 Clicks" Mobility  Outcome Measure Help needed turning from your back to your side while in a flat bed without using bedrails?: A Lot Help needed moving from lying on your back to sitting on the side of a flat bed without using bedrails?: A Lot Help needed moving to and from a bed to a chair (including a wheelchair)?: A Little Help needed standing up from a chair using your arms (e.g., wheelchair or bedside chair)?: A Little Help needed to walk in hospital room?: A Lot Help needed climbing 3-5 steps with a railing? : A Lot 6 Click Score: 14    End of Session Equipment Utilized During Treatment: Gait belt Activity Tolerance: Patient limited by fatigue Patient left: in chair;with call bell/phone within reach;with chair alarm set Nurse Communication: Mobility status PT Visit Diagnosis: Unsteadiness on feet (R26.81);Other abnormalities of gait and mobility (R26.89);Muscle weakness (generalized) (M62.81);History of falling (Z91.81)    Time: 1610-9604 PT Time Calculation (min) (ACUTE ONLY): 24 min   Charges:  PT Evaluation $PT Eval Low Complexity: 1 Low PT Treatments $Therapeutic Activity: 8-22 mins PT General Charges $$ ACUTE PT VISIT: 1 Visit       Yesli Vanderhoff Romero-Perozo, SPT  09/27/2023, 4:33 PM

## 2023-09-27 NOTE — Progress Notes (Signed)
 Transition of Care [facility]) - Inpatient Brief Assessment   Patient Details  Name: Adriana Miller MRN: [id number] Date of Birth: 1955/12/27  Transition of Care [facility]) CM/SW Contact:    Baird Bombard, RN Phone Number: 09/27/2023, 2:21 PM   Clinical Narrative: TOC continuing to follow patient's progress throughout discharge planning.   Transition of Care Asessment: Insurance and Status: Insurance coverage has been reviewed Patient has primary care physician: Yes   Prior level of function:: Independent Prior/Current Home Services: No current home services Social Drivers of Health Review: SDOH reviewed no interventions necessary Readmission risk has been reviewed: Yes Transition of care needs: no transition of care needs at this time

## 2023-09-27 NOTE — ED Notes (Signed)
 Pt given shasta cola for BGL 57, AO NAD

## 2023-09-27 NOTE — ED Notes (Signed)
 Pt given milk and graham crackers for BGL 63, AO, NAD able to eat and drink

## 2023-09-27 NOTE — Consult Note (Signed)
 WOC Nurse Consult Note: Reason for Consult: MASD buttocks Wound type: Moisture Associated Skin Damage to buttocks/sacrum ICD-10 CM Codes for Irritant Dermatitis  L24A2 - Due to fecal, urinary or dual incontinence  Pressure Injury POA: NA  Measurement: widespread to buttocks/sacrum  Wound bed: erythema with scattered partial thickness skin loss r/t moisture and friction  Drainage (amount, consistency, odor)  Periwound: erythema, some evidence of chronic tissue damage to buttocks (dark chronic appearing widespread discoloration)  Dressing procedure/placement/frequency: Cleanse sacrum/buttocks with Vashe wound cleanser Timm Foot [id number]) do not rinse and allow to air dry. Apply a thin layer of Desitin to area 3 times daily and prn soiling.  POC discussed with bedside nurse.  WOC team will not follow. Re-consult if further needs arise.   Thank you,    Ronni Colace MSN, RN-BC, [employer]

## 2023-09-28 DIAGNOSIS — I4891 Unspecified atrial fibrillation: Secondary | ICD-10-CM | POA: Diagnosis not present

## 2023-09-28 LAB — GLUCOSE, CAPILLARY
Glucose-Capillary: 31 mg/dL — CL (ref 70–99)
Glucose-Capillary: 46 mg/dL — ABNORMAL LOW (ref 70–99)
Glucose-Capillary: 103 mg/dL — ABNORMAL HIGH (ref 70–99)
Glucose-Capillary: 219 mg/dL — ABNORMAL HIGH (ref 70–99)
Glucose-Capillary: 136 mg/dL — ABNORMAL HIGH (ref 70–99)
Glucose-Capillary: 158 mg/dL — ABNORMAL HIGH (ref 70–99)

## 2023-09-28 NOTE — Progress Notes (Signed)
 Physical Therapy Treatment Patient Details Name: Adriana Miller MRN: [id number] DOB: Jun 10, 1955 Today's Date: 09/28/2023   History of Present Illness Adriana Miller is a 68 y.o. female with medical history significant of  HTN, HLD, DM, dCHF, gout, depression, CKD-3A, A-fib on Eliquis , alcoholic cirrhosis, thrombocytopenia, hepatocellular carcinoma, chronic diarrhea, alcohol  abuse, who presents with weakness and low blood sugar    PT Comments  Today's tx was a continuation of mobility and ambulation. Pt able to complete bed mobility this date with less verbal and tactile cues, CGA. STS continue to require verbal and tactile cueing, as pt has a difficult time initiating movement, sometimes require step by step  commands for limb negotiation. Ambulation was successful with CGA and use of RW, with increased time to complete motion. No LOB experienced and pt overall tolerated session well. PT to follow acutely as appropriate.    If plan is discharge home, recommend the following: A lot of help with walking and/or transfers;A little help with bathing/dressing/bathroom;Assistance with cooking/housework;Assist for transportation;Help with stairs or ramp for entrance   Can travel by private vehicle     Yes  Equipment Recommendations  None recommended by PT    Recommendations for Other Services       Precautions / Restrictions Precautions Precautions: Fall Recall of Precautions/Restrictions: Impaired Restrictions Weight Bearing Restrictions Per Provider Order: No     Mobility  Bed Mobility Overal bed mobility: Needs Assistance Bed Mobility: Supine to Sit     Supine to sit: Contact guard     General bed mobility comments: verbal and tactile cueing on placement of UE and LE, HOB raised and use of rails    Transfers Overall transfer level: Needs assistance Equipment used: Rolling walker (2 wheels) Transfers: Sit to/from Stand Sit to Stand: Contact guard assist            General transfer comment: bed raised to stand, verbal and tactile cues given for placement of limbs and use of RW, helped pt to bathroom    Ambulation/Gait Ambulation/Gait assistance: Contact guard assist Gait Distance (Feet): 40 Feet Assistive device: Rolling walker (2 wheels) Gait Pattern/deviations: Step-to pattern, Decreased step length - right, Decreased step length - left       General Gait Details: increased time needed to complete task, no LOB, use of RW   Stairs             Wheelchair Mobility     Tilt Bed    Modified Rankin (Stroke Patients Only)       Balance Overall balance assessment: Needs assistance Sitting-balance support: Single extremity supported, Feet supported Sitting balance-Leahy Scale: Fair Sitting balance - Comments: sitting EOB   Standing balance support: Bilateral upper extremity supported Standing balance-Leahy Scale: Fair Standing balance comment: use of RW, no LOB experienced                            Communication Communication Communication: No apparent difficulties  Cognition Arousal: Alert Behavior During Therapy: WFL for tasks assessed/performed   PT - Cognitive impairments: Safety/Judgement                       PT - Cognition Comments: pt did not think she needed RW/assistance getting up from chair, reinforcement required Following commands: Intact      Cueing Cueing Techniques: Verbal cues, Tactile cues  Exercises Other Exercises Other Exercises: Took pt to the bathroom, pt able to perform  self-hygine, a little unstable when washing hands    General Comments        Pertinent Vitals/Pain Pain Assessment Pain Assessment: No/denies pain    Home Living                          Prior Function            PT Goals (current goals can now be found in the care plan section) Acute Rehab PT Goals Patient Stated Goal: to improve mobility and return to PLOF PT Goal Formulation: With  patient Time For Goal Achievement: 10/11/23 Potential to Achieve Goals: Good Progress towards PT goals: Progressing toward goals    Frequency    Min 2X/week      PT Plan      Co-evaluation              AM-PAC PT 6 Clicks Mobility   Outcome Measure  Help needed turning from your back to your side while in a flat bed without using bedrails?: A Little Help needed moving from lying on your back to sitting on the side of a flat bed without using bedrails?: A Little Help needed moving to and from a bed to a chair (including a wheelchair)?: A Little Help needed standing up from a chair using your arms (e.g., wheelchair or bedside chair)?: A Lot Help needed to walk in hospital room?: A Lot Help needed climbing 3-5 steps with a railing? : A Lot 6 Click Score: 15    End of Session   Activity Tolerance: Patient tolerated treatment well Patient left: in bed;with call bell/phone within reach;with bed alarm set Nurse Communication: Mobility status PT Visit Diagnosis: Unsteadiness on feet (R26.81);Other abnormalities of gait and mobility (R26.89);Muscle weakness (generalized) (M62.81);History of falling (Z91.81)     Time: 1547-1610 PT Time Calculation (min) (ACUTE ONLY): 23 min  Charges:    $Gait Training: 8-22 mins $Therapeutic Activity: 8-22 mins PT General Charges $$ ACUTE PT VISIT: 1 Visit                        Adriana Miller, SPT  09/28/2023, 4:30 PM

## 2023-09-28 NOTE — Plan of Care (Signed)
  Problem: Clinical Measurements: Goal: Ability to maintain clinical measurements within normal limits will improve Outcome: Progressing   Problem: Activity: Goal: Risk for activity intolerance will decrease Outcome: Progressing   Problem: Elimination: Goal: Will not experience complications related to bowel motility Outcome: Progressing   Problem: Pain Managment: Goal: General experience of comfort will improve and/or be controlled Outcome: Progressing   Problem: Skin Integrity: Goal: Risk for impaired skin integrity will decrease Outcome: Progressing

## 2023-09-28 NOTE — NC FL2 (Signed)
 [place]  MEDICAID FL2 LEVEL OF CARE FORM     IDENTIFICATION  Patient Name: Adriana Miller Birthdate: 1955/05/08 Sex: female Admission Date (Current Location): 09/26/2023  [place] Number:  Chiropodist and Address:  [facility], [address] [place], [place] [address]      Provider Number: [id number]  Attending Physician Name and Address:  Alphonsus Jeans, MD  Relative Name and Phone Number:  Alger Anthony (Brother)  [phone] (Mobile)    Current Level of Care: Hospital Recommended Level of Care: Skilled Nursing Facility Prior Approval Number:    Date Approved/Denied:   PASRR Number: [id number]  Discharge Plan: SNF    Current Diagnoses: Patient Active Problem List   Diagnosis Date Noted   Overweight (BMI 25.0-29.9) 09/26/2023   Maceration of skin 09/26/2023   ETOH abuse 02/24/2023   Uncomplicated alcohol  dependence (HCC) 02/23/2023   Fall 02/23/2023   Acute on chronic anemia 02/18/2023   Comminuted left humeral fracture, closed, initial encounter 02/14/2023   Accidental fall 02/14/2023   Elevated ETOH level 02/14/2023   Uncontrolled type 2 diabetes mellitus with hyperglycemia, with long-term current use of insulin  (HCC) 02/14/2023   History of recurrent UTI (urinary tract infection) 02/14/2023   Alcohol  use disorder 02/14/2023   Chronic anticoagulation 02/14/2023   Atrial flutter (HCC) 12/27/2022   Chronic atrial fibrillation with RVR (HCC) 11/18/2022   SIRS (systemic inflammatory response syndrome) (HCC) 11/18/2022   Stage 3b chronic kidney disease (HCC) 11/18/2022   Frailty 11/18/2022   Sepsis (HCC) 11/18/2022   Hypoglycemia 10/02/2022   Hyponatremia 10/01/2022   Hypokalemia 10/01/2022   Chronic kidney disease, stage 3a (HCC) 10/01/2022   Urinary tract infection 09/30/2022   Type II diabetes mellitus with renal manifestations (HCC) 09/30/2022   Depression 09/30/2022   Chronic diarrhea 09/30/2022    Hypomagnesemia 09/30/2022   Severe sepsis (HCC) 08/22/2022   AKI (acute kidney injury) (HCC) 08/21/2022   Elevated LFTs 08/21/2022   Thrombocytopenia (HCC) 08/21/2022   History of cerebral aneurysm repair 08/21/2022   Metabolic encephalopathy 08/21/2022   Elevated troponin 08/21/2022   Dehydration 06/18/2022   SIRS due to infectious process with acute organ dysfunction (HCC) 06/17/2022   Bacterial conjunctivitis 06/17/2022   Lactic acidosis 06/17/2022   Paroxysmal atrial fibrillation (HCC) 08/06/2021   Shaking 08/06/2021   Hepatic cirrhosis (HCC) 04/30/2020   Hepatocellular carcinoma (HCC) 01/08/2020   Left arm weakness 07/25/2019   Atrial fibrillation with RVR (HCC) 07/25/2019   Hypotension 04/20/2019   Alcoholic cirrhosis of liver without ascites (HCC)    Chronic heart failure with preserved ejection fraction (HFpEF) (HCC) 01/15/2019   Dyspnea on exertion 10/10/2018   Persistent atrial fibrillation (HCC) 10/10/2018   Diastolic dysfunction 10/10/2018   Acute upper respiratory infection 09/07/2017   Essential hypertension 09/07/2017   Type 2 diabetes mellitus (HCC) 04/05/2017   Weakness 04/05/2017   Mixed hyperlipidemia 04/05/2017   Alcohol  abuse with other alcohol -induced disorder (HCC) 04/05/2017   B12 deficiency 04/05/2017   History of seizure 05/20/2015   Brain aneurysm 05/20/2015    Orientation RESPIRATION BLADDER Height & Weight     Self, Time, Situation, Place  Normal   Weight: 71 kg Height:  5' 6 (167.6 cm)  BEHAVIORAL SYMPTOMS/MOOD NEUROLOGICAL BOWEL NUTRITION STATUS  Other (Comment) (n/a)  (n/a)   Diet (Heart healthy/carb modified)  AMBULATORY STATUS COMMUNICATION OF NEEDS Skin   Limited Assist Verbally Other (Comment) (erythema bilateral buttocks)  Personal Care Assistance Level of Assistance  Dressing, Bathing Bathing Assistance: Limited assistance   Dressing Assistance: Limited assistance     Functional Limitations Info   Sight, Hearing Sight Info: Adequate Hearing Info: Adequate      SPECIAL CARE FACTORS FREQUENCY  PT (By licensed PT), OT (By licensed OT)     PT Frequency: Min 2x weekly OT Frequency: Min 2x weekly            Contractures Contractures Info: Not present    Additional Factors Info  Code Status, Allergies Code Status Info: DNR Allergies Info: Aspirin           Current Medications (09/28/2023):  This is the current hospital active medication list Current Facility-Administered Medications  Medication Dose Route Frequency Provider Last Rate Last Admin   allopurinol  (ZYLOPRIM ) tablet 100 mg  100 mg Oral Daily Niu, Xilin, MD   100 mg at 09/28/23 0821   apixaban  (ELIQUIS ) tablet 5 mg  5 mg Oral BID Niu, Xilin, MD   5 mg at 09/28/23 3244   dextrose  50 % solution 50 mL  50 mL Intravenous PRN Niu, Xilin, MD   50 mL at 09/28/23 0102   escitalopram  (LEXAPRO ) tablet 5 mg  5 mg Oral Daily Niu, Xilin, MD   5 mg at 09/28/23 7253   folic acid  (FOLVITE ) tablet 1 mg  1 mg Oral Daily Niu, Xilin, MD   1 mg at 09/28/23 6644   furosemide  (LASIX ) tablet 20 mg  20 mg Oral Daily Niu, Xilin, MD   20 mg at 09/28/23 0347   hydrALAZINE  (APRESOLINE ) injection 5 mg  5 mg Intravenous Q2H PRN Niu, Xilin, MD       ibuprofen (ADVIL) tablet 200 mg  200 mg Oral Q6H PRN Niu, Xilin, MD       liver oil-zinc oxide (DESITIN) 40 % ointment   Topical TID Niu, Xilin, MD   Given at 09/28/23 4259   loperamide (IMODIUM) capsule 2 mg  2 mg Oral BID PRN Niu, Xilin, MD   2 mg at 09/26/23 2211   metoprolol  tartrate (LOPRESSOR ) injection 2.5 mg  2.5 mg Intravenous Q2H PRN Niu, Xilin, MD       metoprolol  tartrate (LOPRESSOR ) tablet 25 mg  25 mg Oral BID Niu, Xilin, MD   25 mg at 09/28/23 5638   mirtazapine  (REMERON ) tablet 7.5 mg  7.5 mg Oral QHS Niu, Xilin, MD   7.5 mg at 09/27/23 2100   multivitamin with minerals tablet 1 tablet  1 tablet Oral Daily Niu, Xilin, MD   1 tablet at 09/28/23 7564   ondansetron  (ZOFRAN ) injection 4  mg  4 mg Intravenous Q8H PRN Niu, Xilin, MD       spironolactone  (ALDACTONE ) tablet 25 mg  25 mg Oral Daily Niu, Xilin, MD   25 mg at 09/28/23 3329   thiamine  (VITAMIN B1) tablet 100 mg  100 mg Oral Daily Niu, Xilin, MD   100 mg at 09/28/23 5188   traZODone  (DESYREL ) tablet 50 mg  50 mg Oral QHS PRN Niu, Xilin, MD         Discharge Medications: Please see discharge summary for a list of discharge medications.  Relevant Imaging Results:  Relevant Lab Results:   Additional Information [id number]  Arah Aro C Betsaida [place], RN

## 2023-09-28 NOTE — Progress Notes (Signed)
 Hypoglycemic Event  CBG: 46  Treatment: 8 oz juice/soda  Symptoms: None  Follow-up CBG: [id number] CBG Result:31  Possible Reasons for Event: Inadequate meal intake  Comments/MD notified:yes    Daris Harkins D Morgan-Johnson

## 2023-09-28 NOTE — Progress Notes (Signed)
 Hypoglycemic Event  CBG: 31  Treatment: D50 50 mL (25 gm)  Symptoms: None  Follow-up CBG: [id number] CBG Result:103  Possible Reasons for Event: Inadequate meal intake  Comments/MD notified:yes    Adriana Miller

## 2023-09-28 NOTE — TOC Initial Note (Signed)
 Transition of Care [facility]) - Initial/Assessment Note    Patient Details  Name: Adriana Miller MRN: [id number] Date of Birth: 1955-08-27  Transition of Care [facility]) CM/SW Contact:    Baird Bombard, RN Phone Number: 09/28/2023, 3:50 PM  Clinical Narrative:                 Spoke with patient at bedside regarding therapy's recommendation for SNF. She is reluctant but willing to go. She stated she had been at a facility before and did not want to go back but was unable to recall the name of the facility. She does not have any other preferences for facilities other than wanting to be in [place] or [place].   Bed search started        Patient Goals and CMS Choice            Expected Discharge Plan and Services                                              Prior Living Arrangements/Services                       Activities of Daily Living   ADL Screening (condition at time of admission) Independently performs ADLs?: No Does the patient have a NEW difficulty with bathing/dressing/toileting/self-feeding that is expected to last >3 days?: Yes (Initiates electronic notice to provider for possible OT consult) Does the patient have a NEW difficulty with getting in/out of bed, walking, or climbing stairs that is expected to last >3 days?: Yes (Initiates electronic notice to provider for possible PT consult) Does the patient have a NEW difficulty with communication that is expected to last >3 days?: No Is the patient deaf or have difficulty hearing?: No Does the patient have difficulty seeing, even when wearing glasses/contacts?: No Does the patient have difficulty concentrating, remembering, or making decisions?: No  Permission Sought/Granted                  Emotional Assessment              Admission diagnosis:  Hypoglycemia [E16.2] Cellulitis, gluteal [L03.317] Atrial fibrillation with RVR (HCC) [I48.91] Patient Active Problem List    Diagnosis Date Noted   Overweight (BMI 25.0-29.9) 09/26/2023   Maceration of skin 09/26/2023   ETOH abuse 02/24/2023   Uncomplicated alcohol  dependence (HCC) 02/23/2023   Fall 02/23/2023   Acute on chronic anemia 02/18/2023   Comminuted left humeral fracture, closed, initial encounter 02/14/2023   Accidental fall 02/14/2023   Elevated ETOH level 02/14/2023   Uncontrolled type 2 diabetes mellitus with hyperglycemia, with long-term current use of insulin  (HCC) 02/14/2023   History of recurrent UTI (urinary tract infection) 02/14/2023   Alcohol  use disorder 02/14/2023   Chronic anticoagulation 02/14/2023   Atrial flutter (HCC) 12/27/2022   Chronic atrial fibrillation with RVR (HCC) 11/18/2022   SIRS (systemic inflammatory response syndrome) (HCC) 11/18/2022   Stage 3b chronic kidney disease (HCC) 11/18/2022   Frailty 11/18/2022   Sepsis (HCC) 11/18/2022   Hypoglycemia 10/02/2022   Hyponatremia 10/01/2022   Hypokalemia 10/01/2022   Chronic kidney disease, stage 3a (HCC) 10/01/2022   Urinary tract infection 09/30/2022   Type II diabetes mellitus with renal manifestations (HCC) 09/30/2022   Depression 09/30/2022   Chronic diarrhea 09/30/2022   Hypomagnesemia 09/30/2022   Severe sepsis (  HCC) 08/22/2022   AKI (acute kidney injury) (HCC) 08/21/2022   Elevated LFTs 08/21/2022   Thrombocytopenia (HCC) 08/21/2022   History of cerebral aneurysm repair 08/21/2022   Metabolic encephalopathy 08/21/2022   Elevated troponin 08/21/2022   Dehydration 06/18/2022   SIRS due to infectious process with acute organ dysfunction (HCC) 06/17/2022   Bacterial conjunctivitis 06/17/2022   Lactic acidosis 06/17/2022   Paroxysmal atrial fibrillation (HCC) 08/06/2021   Shaking 08/06/2021   Hepatic cirrhosis (HCC) 04/30/2020   Hepatocellular carcinoma (HCC) 01/08/2020   Left arm weakness 07/25/2019   Atrial fibrillation with RVR (HCC) 07/25/2019   Hypotension 04/20/2019   Alcoholic cirrhosis of liver  without ascites (HCC)    Chronic heart failure with preserved ejection fraction (HFpEF) (HCC) 01/15/2019   Dyspnea on exertion 10/10/2018   Persistent atrial fibrillation (HCC) 10/10/2018   Diastolic dysfunction 10/10/2018   Acute upper respiratory infection 09/07/2017   Essential hypertension 09/07/2017   Type 2 diabetes mellitus (HCC) 04/05/2017   Weakness 04/05/2017   Mixed hyperlipidemia 04/05/2017   Alcohol  abuse with other alcohol -induced disorder (HCC) 04/05/2017   B12 deficiency 04/05/2017   History of seizure 05/20/2015   Brain aneurysm 05/20/2015   PCP:  Jacques Mattock, PA-C Pharmacy:   [facility] [phone] - [place], [place] - [address] [place] [address] Phone: [phone] Fax: [phone]  [facility] [place] - [address], [place] - [address] [place] [address] Phone: [phone] Fax: [phone]  [facility] - [place], [place] - [address] [place] [address] Phone: [phone] Fax: [phone]  [facility] [place]  - [place], [place] - [address] [place] [address] Phone: [phone] Fax: [phone]     Social Drivers of Health (SDOH) Social History: SDOH Screenings   Food Insecurity: No Food Insecurity (09/27/2023)  Housing: Low Risk  (09/27/2023)  Transportation Needs: No Transportation Needs (09/27/2023)  Utilities: Not At Risk (09/27/2023)  Alcohol  Screen: Low Risk  (06/06/2023)  Depression (PHQ2-9): Low Risk  (01/27/2023)  Social Connections: Moderately Isolated (09/27/2023)  Tobacco Use: High Risk (09/26/2023)   SDOH Interventions:     Readmission Risk Interventions    11/20/2022    2:04 PM 10/01/2022    9:15 AM  Readmission Risk Prevention Plan  Transportation Screening Complete Complete  Medication Review Oceanographer) Complete Complete  PCP or Specialist appointment within 3-5 days of  discharge Complete Complete  HRI or Home Care Consult Complete Complete  SW Recovery Care/Counseling Consult Complete Complete  Palliative Care Screening Not Applicable Not Applicable  Skilled Nursing Facility Not Applicable Not Applicable

## 2023-09-28 NOTE — Progress Notes (Signed)
 PROGRESS NOTE    Adriana Miller  UJW:[id number] DOB: 02/15/1956 DOA: 09/26/2023 PCP: Jacques Mattock, PA-C   Assessment & Plan:   Principal Problem:   Atrial fibrillation with RVR (HCC) Active Problems:   Hypoglycemia   Type II diabetes mellitus with renal manifestations (HCC)   Chronic diarrhea   Alcoholic cirrhosis of liver without ascites (HCC)   Alcohol  use disorder   Essential hypertension   Chronic kidney disease, stage 3a (HCC)   Thrombocytopenia (HCC)   Chronic heart failure with preserved ejection fraction (HFpEF) (HCC)   Depression   Maceration of skin   Overweight (BMI 25.0-29.9)  Assessment and Plan: PAF: w/ RVR. Continue on metoprolol , eliquis . Continue on tele   DM2: w/ hypoglycemia episodes, possibly secondary to poor po intake. HbA1c 4.9, well controlled. No need for insulin . Continue on monitor blood sugars closely    Chronic diarrhea: imodium prn    Alcoholic cirrhosis of liver: continue on home dose of aldactone , lasix . Will need to f/u outpatient w/ GI    Alcohol  use disorder: last drink was 2 months ago as per pt.    HTN: continue on metoprolol , aldactone  & lasix     CKDIIIa: Cr is labile. Avoid nephrotoxic meds    Thrombocytopenia: likely secondary to alcoholic liver cirrhosis   Chronic diastolic CHF: appears compensated. Continue on metoprolol , lasix , aldactone . Monitor I/Os    Depression: severity unknown. Continue on home odse of lexapro     Maceration of skin: will hold off on abxs currently and monitor. Continue w/ supportive care      DVT prophylaxis: eliquis   Code Status: DNR Family Communication:  Disposition Plan: likely d/c to SNF  Status is: Inpatient Remains inpatient appropriate because: severity of illness    Level of care: Progressive Consultants:    Procedures:   Antimicrobials:   Subjective: Pt c/o malaise   Objective: Vitals:   09/28/23 0521 09/28/23 0530 09/28/23 0756 09/28/23 0828  BP: 113/72  (!)  100/46 118/67  Pulse: 77  79   Resp: 18  18   Temp: (!) 97.3 F (36.3 C)  97.6 F (36.4 C)   TempSrc:   Oral   SpO2: 97%  100%   Weight:  71 kg    Height:        Intake/Output Summary (Last 24 hours) at 09/28/2023 1008 Last data filed at 09/28/2023 0531 Gross per 24 hour  Intake --  Output 650 ml  Net -650 ml   Filed Weights   09/26/23 1651 09/28/23 0530  Weight: 77.6 kg 71 kg    Examination:  General exam: Appears calm and comfortable  Respiratory system: Clear to auscultation. Respiratory effort normal. Cardiovascular system: irregularly irregular. No rubs, gallops or clicks.  Gastrointestinal system: Abdomen is nondistended, soft and nontender.  Normal bowel sounds heard. Central nervous system: Alert and awake. Moves all extremities  Psychiatry: Judgement and insight appears at baseline. Flat mood and affect     Data Reviewed: I have personally reviewed following labs and imaging studies  CBC: Recent Labs  Lab 09/26/23 1651 09/27/23 0600  WBC 3.4* 2.6*  NEUTROABS 2.6  --   HGB 14.8 12.2  HCT 43.9 36.3  MCV 100.0 100.8*  PLT 92* 73*   Basic Metabolic Panel: Recent Labs  Lab 09/26/23 1651 09/27/23 0600  NA 136 135  K 4.5 4.0  CL 104 106  CO2 21* 21*  GLUCOSE 33* 61*  BUN 10 9  CREATININE 1.20* 1.07*  CALCIUM  8.5* 7.9*  MG  1.9  --   PHOS 3.6  --    GFR: Estimated Creatinine Clearance: 46.5 mL/min (A) (by C-G formula based on SCr of 1.07 mg/dL (H)). Liver Function Tests: Recent Labs  Lab 09/26/23 1651  AST 103*  ALT 46*  ALKPHOS 92  BILITOT 1.4*  PROT 7.8  ALBUMIN  2.5*   Recent Labs  Lab 09/26/23 1651  LIPASE 44   Recent Labs  Lab 09/27/23 0951  AMMONIA 45*   Coagulation Profile: Recent Labs  Lab 09/26/23 1651  INR 1.4*   Cardiac Enzymes: No results for input(s): CKTOTAL, CKMB, CKMBINDEX, TROPONINI in the last 168 hours. BNP (last 3 results) No results for input(s): PROBNP in the last 8760 hours. HbA1C: Recent  Labs    09/27/23 0600  HGBA1C 4.9   CBG: Recent Labs  Lab 09/27/23 1607 09/27/23 2240 09/28/23 0754 09/28/23 0822 09/28/23 0848  GLUCAP 132* 109* 46* 31* 103*   Lipid Profile: No results for input(s): CHOL, HDL, LDLCALC, TRIG, CHOLHDL, LDLDIRECT in the last 72 hours. Thyroid  Function Tests: Recent Labs    09/26/23 1651  TSH 1.173   Anemia Panel: No results for input(s): VITAMINB12, FOLATE, FERRITIN, TIBC, IRON , RETICCTPCT in the last 72 hours. Sepsis Labs: No results for input(s): PROCALCITON, LATICACIDVEN in the last 168 hours.  Recent Results (from the past 240 hours)  Blood culture (routine x 2)     Status: None (Preliminary result)   Collection Time: 09/26/23  6:47 PM   Specimen: BLOOD RIGHT HAND  Result Value Ref Range Status   Specimen Description BLOOD RIGHT HAND  Final   Special Requests   Final    BOTTLES DRAWN AEROBIC ONLY Blood Culture results may not be optimal due to an inadequate volume of blood received in culture bottles   Culture   Final    NO GROWTH 2 DAYS Performed at [facility], [address] [place], [place] [address]    Report Status PENDING  Incomplete  Blood culture (routine x 2)     Status: None (Preliminary result)   Collection Time: 09/27/23  9:51 AM   Specimen: BLOOD  Result Value Ref Range Status   Specimen Description BLOOD BLOOD LEFT FOREARM  Final   Special Requests   Final    BOTTLES DRAWN AEROBIC AND ANAEROBIC Blood Culture results may not be optimal due to an inadequate volume of blood received in culture bottles   Culture   Final    NO GROWTH < 24 HOURS Performed at [facility], [address] [place], [place] [address]    Report Status PENDING  Incomplete         Radiology Studies: DG Chest Portable 1 View Result Date: 09/26/2023 CLINICAL DATA:  Hypoglycemia. EXAM: PORTABLE CHEST 1 VIEW COMPARISON:  02/14/2023. FINDINGS: Low lung volumes. The heart size and  mediastinal contours are within normal limits. Aortic atherosclerosis. No focal consolidation, pleural effusion, or pneumothorax. Remote fracture of the left proximal humerus with marginal callus formation. No acute osseous abnormality identified. IMPRESSION: 1. Low lung volumes.  No acute cardiopulmonary findings. 2. Remote healing fracture of the left proximal humerus. Electronically Signed   By: Mannie Seek M.D.   On: 09/26/2023 18:27        Scheduled Meds:  allopurinol   100 mg Oral Daily   apixaban   5 mg Oral BID   escitalopram   5 mg Oral Daily   folic acid   1 mg Oral Daily   furosemide   20 mg Oral Daily  liver oil-zinc oxide   Topical TID   metoprolol  tartrate  25 mg Oral BID   mirtazapine   7.5 mg Oral QHS   multivitamin with minerals  1 tablet Oral Daily   spironolactone   25 mg Oral Daily   thiamine   100 mg Oral Daily   Continuous Infusions:   LOS: 1 day     Alphonsus Jeans, MD [facility] Pager [phone]  If 7PM-7AM, please contact night-coverage [email or web address] 09/28/2023, 10:08 AM '

## 2023-09-28 NOTE — Plan of Care (Signed)
  Problem: Clinical Measurements: Goal: Ability to maintain clinical measurements within normal limits will improve Outcome: Progressing   Problem: Activity: Goal: Risk for activity intolerance will decrease Outcome: Progressing   Problem: Nutrition: Goal: Adequate nutrition will be maintained Outcome: Progressing   Problem: Elimination: Goal: Will not experience complications related to bowel motility Outcome: Progressing Goal: Will not experience complications related to urinary retention Outcome: Progressing   Problem: Pain Managment: Goal: General experience of comfort will improve and/or be controlled Outcome: Progressing   Problem: Safety: Goal: Ability to remain free from injury will improve Outcome: Progressing   Problem: Skin Integrity: Goal: Risk for impaired skin integrity will decrease Outcome: Progressing

## 2023-09-29 DIAGNOSIS — I4891 Unspecified atrial fibrillation: Secondary | ICD-10-CM | POA: Diagnosis not present

## 2023-09-29 LAB — CBC
HCT: 39.3 % (ref 36.0–46.0)
Hemoglobin: 12.9 g/dL (ref 12.0–15.0)
MCH: 33.4 pg (ref 26.0–34.0)
MCHC: 32.8 g/dL (ref 30.0–36.0)
MCV: 101.8 fL — ABNORMAL HIGH (ref 80.0–100.0)
Platelets: 81 10*3/uL — ABNORMAL LOW (ref 150–400)
RBC: 3.86 MIL/uL — ABNORMAL LOW (ref 3.87–5.11)
RDW: 15.9 % — ABNORMAL HIGH (ref 11.5–15.5)
WBC: 4 10*3/uL (ref 4.0–10.5)
nRBC: 0 % (ref 0.0–0.2)

## 2023-09-29 LAB — GLUCOSE, CAPILLARY
Glucose-Capillary: 129 mg/dL — ABNORMAL HIGH (ref 70–99)
Glucose-Capillary: 238 mg/dL — ABNORMAL HIGH (ref 70–99)
Glucose-Capillary: 204 mg/dL — ABNORMAL HIGH (ref 70–99)
Glucose-Capillary: 269 mg/dL — ABNORMAL HIGH (ref 70–99)

## 2023-09-29 LAB — COMPREHENSIVE METABOLIC PANEL WITH GFR
ALT: 31 U/L (ref 0–44)
AST: 67 U/L — ABNORMAL HIGH (ref 15–41)
Albumin: 2 g/dL — ABNORMAL LOW (ref 3.5–5.0)
Alkaline Phosphatase: 76 U/L (ref 38–126)
Anion gap: 7 (ref 5–15)
BUN: 14 mg/dL (ref 8–23)
CO2: 26 mmol/L (ref 22–32)
Calcium: 8.2 mg/dL — ABNORMAL LOW (ref 8.9–10.3)
Chloride: 102 mmol/L (ref 98–111)
Creatinine, Ser: 1.2 mg/dL — ABNORMAL HIGH (ref 0.44–1.00)
GFR, Estimated: 49 mL/min — ABNORMAL LOW (ref >60)
Glucose, Bld: 114 mg/dL — ABNORMAL HIGH (ref 70–99)
Potassium: 4.3 mmol/L (ref 3.5–5.1)
Sodium: 135 mmol/L (ref 135–145)
Total Bilirubin: 1.4 mg/dL — ABNORMAL HIGH (ref 0.0–1.2)
Total Protein: 6.2 g/dL — ABNORMAL LOW (ref 6.5–8.1)

## 2023-09-29 LAB — AMMONIA: Ammonia: 32 umol/L (ref 9–35)

## 2023-09-29 NOTE — Progress Notes (Signed)
 PROGRESS NOTE    Adriana Miller  ZOX:[id number] DOB: 27-Jan-1956 DOA: 09/26/2023 PCP: Jacques Mattock, PA-C   Assessment & Plan:   Principal Problem:   Atrial fibrillation with RVR (HCC) Active Problems:   Hypoglycemia   Type II diabetes mellitus with renal manifestations (HCC)   Chronic diarrhea   Alcoholic cirrhosis of liver without ascites (HCC)   Alcohol  use disorder   Essential hypertension   Chronic kidney disease, stage 3a (HCC)   Thrombocytopenia (HCC)   Chronic heart failure with preserved ejection fraction (HFpEF) (HCC)   Depression   Maceration of skin   Overweight (BMI 25.0-29.9)  Assessment and Plan: PAF: w/ RVR. Continue on eliquis , metoprolol . Continue on tele. HR is better controlled.  DM2: w/ hypoglycemia episodes, possibly secondary to poor po intake. No hypoglycemic episodes today so far. HbA1c 4.9, well controlled. No need for insulin . Continue to monitor blood sugars closely     Chronic diarrhea: imodium prn    Alcoholic cirrhosis of liver: continue on home dose of aldactone , lasix . Will need to f/u outpatient w/ GI    Alcohol  use disorder: last drink was 2 months ago as per pt.    HTN: continue on lasix , aldactone , metoprolol     CKDIIIa: Cr is labile. Will continue to monitor    Thrombocytopenia: labile. Likely secondary to cirrhosis    Chronic diastolic CHF: appears compensated. Continue on aldactone , lasix  & metoprolol . Monitor I/Os    Depression: severity unknown. Continue on home dose of lexapro    Maceration of skin: will hold off on abxs currently and monitor. Continue w/ supportive care      DVT prophylaxis: eliquis   Code Status: DNR Family Communication:  Disposition Plan: likely d/c to SNF  Status is: Inpatient Remains inpatient appropriate because: severity of illness, waiting on SNF placement     Level of care: Progressive Consultants:    Procedures:   Antimicrobials:   Subjective: Pt c/o fatigue    Objective: Vitals:   09/28/23 2339 09/29/23 0327 09/29/23 0500 09/29/23 0835  BP: 109/68 100/62  93/65  Pulse: 76 83  74  Resp: 20 18  16   Temp: 98.1 F (36.7 C) 97.7 F (36.5 C)  97.8 F (36.6 C)  TempSrc: Axillary     SpO2: 100% 98%  99%  Weight:   72.6 kg   Height:        Intake/Output Summary (Last 24 hours) at 09/29/2023 0907 Last data filed at 09/29/2023 0500 Gross per 24 hour  Intake --  Output 1400 ml  Net -1400 ml   Filed Weights   09/26/23 1651 09/28/23 0530 09/29/23 0500  Weight: 77.6 kg 71 kg 72.6 kg    Examination:  General exam: Appears comfortable  Respiratory system: clear breath sounds b/l  Cardiovascular system: irregularly irregular.  Gastrointestinal system: abd is soft, NT, & hypoactive bowel sounds Central nervous system: alert & awake. Moves all extremities  Psychiatry: judgement and insight appears at baseline. Flat mood and affect      Data Reviewed: I have personally reviewed following labs and imaging studies  CBC: Recent Labs  Lab 09/26/23 1651 09/27/23 0600 09/29/23 0600  WBC 3.4* 2.6* 4.0  NEUTROABS 2.6  --   --   HGB 14.8 12.2 12.9  HCT 43.9 36.3 39.3  MCV 100.0 100.8* 101.8*  PLT 92* 73* 81*   Basic Metabolic Panel: Recent Labs  Lab 09/26/23 1651 09/27/23 0600 09/29/23 0600  NA 136 135 135  K 4.5 4.0 4.3  CL  104 106 102  CO2 21* 21* 26  GLUCOSE 33* 61* 114*  BUN 10 9 14   CREATININE 1.20* 1.07* 1.20*  CALCIUM  8.5* 7.9* 8.2*  MG 1.9  --   --   PHOS 3.6  --   --    GFR: Estimated Creatinine Clearance: 45.1 mL/min (A) (by C-G formula based on SCr of 1.2 mg/dL (H)). Liver Function Tests: Recent Labs  Lab 09/26/23 1651 09/29/23 0600  AST 103* 67*  ALT 46* 31  ALKPHOS 92 76  BILITOT 1.4* 1.4*  PROT 7.8 6.2*  ALBUMIN  2.5* 2.0*   Recent Labs  Lab 09/26/23 1651  LIPASE 44   Recent Labs  Lab 09/27/23 0951 09/29/23 0600  AMMONIA 45* 32   Coagulation Profile: Recent Labs  Lab 09/26/23 1651  INR  1.4*   Cardiac Enzymes: No results for input(s): CKTOTAL, CKMB, CKMBINDEX, TROPONINI in the last 168 hours. BNP (last 3 results) No results for input(s): PROBNP in the last 8760 hours. HbA1C: Recent Labs    09/27/23 0600  HGBA1C 4.9   CBG: Recent Labs  Lab 09/28/23 0848 09/28/23 1151 09/28/23 1703 09/28/23 2031 09/29/23 0833  GLUCAP 103* 219* 136* 158* 129*   Lipid Profile: No results for input(s): CHOL, HDL, LDLCALC, TRIG, CHOLHDL, LDLDIRECT in the last 72 hours. Thyroid  Function Tests: Recent Labs    09/26/23 1651  TSH 1.173   Anemia Panel: No results for input(s): VITAMINB12, FOLATE, FERRITIN, TIBC, IRON , RETICCTPCT in the last 72 hours. Sepsis Labs: No results for input(s): PROCALCITON, LATICACIDVEN in the last 168 hours.  Recent Results (from the past 240 hours)  Blood culture (routine x 2)     Status: None (Preliminary result)   Collection Time: 09/26/23  6:47 PM   Specimen: BLOOD RIGHT HAND  Result Value Ref Range Status   Specimen Description BLOOD RIGHT HAND  Final   Special Requests   Final    BOTTLES DRAWN AEROBIC ONLY Blood Culture results may not be optimal due to an inadequate volume of blood received in culture bottles   Culture   Final    NO GROWTH 3 DAYS Performed at [facility], [address] [place], [place] [address]    Report Status PENDING  Incomplete  Blood culture (routine x 2)     Status: None (Preliminary result)   Collection Time: 09/27/23  9:51 AM   Specimen: BLOOD  Result Value Ref Range Status   Specimen Description BLOOD BLOOD LEFT FOREARM  Final   Special Requests   Final    BOTTLES DRAWN AEROBIC AND ANAEROBIC Blood Culture results may not be optimal due to an inadequate volume of blood received in culture bottles   Culture   Final    NO GROWTH 2 DAYS Performed at [facility], [address], [place], [place] [address]    Report Status PENDING  Incomplete          Radiology Studies: No results found.       Scheduled Meds:  allopurinol   100 mg Oral Daily   apixaban   5 mg Oral BID   escitalopram   5 mg Oral Daily   folic acid   1 mg Oral Daily   furosemide   20 mg Oral Daily   liver oil-zinc oxide   Topical TID   metoprolol  tartrate  25 mg Oral BID   mirtazapine   7.5 mg Oral QHS   multivitamin with minerals  1 tablet Oral Daily   spironolactone   25 mg Oral Daily  thiamine   100 mg Oral Daily   Continuous Infusions:   LOS: 2 days     Alphonsus Jeans, MD [facility] Pager [phone]  If 7PM-7AM, please contact night-coverage [email or web address] 09/29/2023, 9:07 AM '

## 2023-09-29 NOTE — TOC Progression Note (Signed)
 Transition of Care [facility]) - Progression Note    Patient Details  Name: Adriana Miller MRN: [id number] Date of Birth: 1955-10-24  [facility]) CM/SW Contact  Baird Bombard, RN Phone Number: 09/29/2023, 3:06 PM  Clinical Narrative:    Spoke with patient to give bed offer for [facility] as requested by her brother. Patient stated he changes his mind to another facility.  Contacted patient's brother, Royston Cornea. Patient brother is requesting [facility] or [facility]. He was advised patient has bed offers for both. He chose [facility]. Patient brother advised [facility] will be require.        Expected Discharge Plan and Services                                               Social Determinants of Health (SDOH) Interventions SDOH Screenings   Food Insecurity: No Food Insecurity (09/27/2023)  Housing: Low Risk  (09/27/2023)  Transportation Needs: No Transportation Needs (09/27/2023)  Utilities: Not At Risk (09/27/2023)  Alcohol  Screen: Low Risk  (06/06/2023)  Depression (PHQ2-9): Low Risk  (01/27/2023)  Social Connections: Moderately Isolated (09/27/2023)  Tobacco Use: High Risk (09/26/2023)    Readmission Risk Interventions    11/20/2022    2:04 PM 10/01/2022    9:15 AM  Readmission Risk Prevention Plan  Transportation Screening Complete Complete  Medication Review Oceanographer) Complete Complete  PCP or Specialist appointment within 3-5 days of discharge Complete Complete  HRI or Home Care Consult Complete Complete  SW Recovery Care/Counseling Consult Complete Complete  Palliative Care Screening Not Applicable Not Applicable  Skilled Nursing Facility Not Applicable Not Applicable

## 2023-09-29 NOTE — Plan of Care (Signed)
?  Problem: Clinical Measurements: ?Goal: Respiratory complications will improve ?Outcome: Progressing ?  ?Problem: Activity: ?Goal: Risk for activity intolerance will decrease ?Outcome: Progressing ?  ?Problem: Nutrition: ?Goal: Adequate nutrition will be maintained ?Outcome: Progressing ?  ?Problem: Elimination: ?Goal: Will not experience complications related to bowel motility ?Outcome: Progressing ?  ?Problem: Safety: ?Goal: Ability to remain free from injury will improve ?Outcome: Progressing ?  ?Problem: Skin Integrity: ?Goal: Risk for impaired skin integrity will decrease ?Outcome: Progressing ?  ?

## 2023-09-29 NOTE — Progress Notes (Signed)
 Physical Therapy Treatment Patient Details Name: Adriana Miller MRN: [id number] DOB: 24-Feb-1956 Today's Date: 09/29/2023   History of Present Illness Adriana Miller is a 68 y.o. female with medical history significant of  HTN, HLD, DM, dCHF, gout, depression, CKD-3A, A-fib on Eliquis , alcoholic cirrhosis, thrombocytopenia, hepatocellular carcinoma, chronic diarrhea, alcohol  abuse, who presents with weakness and low blood sugar    PT Comments  Pt was sitting in recliner upon arrival. She is eager to get out of recliner due to LBP/butt pain. Pt was able to stand and ambulate into hallway a total of ~ 60 ft. HR in A-fib and extreme tachy at times however pt endorses no symptoms of fatigue. I feel great now that I'm up and moving. Pt does endorse needing to have BM however was unable to make it back to room in time. Author assisted with hygiene care post ambulation/BM prior to pt returning to bed. RN arrived at conclusion of session. DC recs remain appropriate to maximize independence and safety with all ADLs.    If plan is discharge home, recommend the following: A little help with walking and/or transfers;A little help with bathing/dressing/bathroom;Assistance with cooking/housework;Assistance with feeding;Direct supervision/assist for medications management;Direct supervision/assist for financial management;Assist for transportation;Help with stairs or ramp for entrance;Supervision due to cognitive status     Equipment Recommendations  None recommended by PT       Precautions / Restrictions Precautions Precautions: Fall Recall of Precautions/Restrictions: Impaired Restrictions Weight Bearing Restrictions Per Provider Order: No     Mobility  Bed Mobility Overal bed mobility: Needs Assistance Bed Mobility: Sit to Supine  Sit to supine: Min assist   Transfers Overall transfer level: Needs assistance Equipment used: Rolling walker (2 wheels) Transfers: Sit to/from Stand Sit to  Stand: Contact guard assist  General transfer comment: CGA for safety with vcs for handplacement and technique improvements    Ambulation/Gait Ambulation/Gait assistance: Contact guard assist Gait Distance (Feet): 60 Feet Assistive device: Rolling walker (2 wheels) Gait Pattern/deviations: Step-through pattern Gait velocity: decreased  General Gait Details: Pt was able to ambulate ~ 60 ft with RW. HR in Afib and reading extreme tachy however pt completely asymtomatic. HR hit 150s bpm several times. pt did have BM/incontinece episode during ambulation. Author assisted with hygiene care afterwards.    Balance Overall balance assessment: Needs assistance Sitting-balance support: Feet supported Sitting balance-Leahy Scale: Fair     Standing balance support: Bilateral upper extremity supported Standing balance-Leahy Scale: Fair Standing balance comment: Use of RW, no LOB experienced     Communication Communication Communication: No apparent difficulties  Cognition Arousal: Alert Behavior During Therapy: WFL for tasks assessed/performed   PT - Cognitive impairments: Safety/Judgement      PT - Cognition Comments: Pt is A and O x 3. Poor overall awareness Following commands: Intact      Cueing Cueing Techniques: Verbal cues, Tactile cues         Pertinent Vitals/Pain Pain Assessment Pain Assessment: No/denies pain     PT Goals (current goals can now be found in the care plan section) Acute Rehab PT Goals Patient Stated Goal: to improve mobility and return to PLOF Progress towards PT goals: Progressing toward goals    Frequency    Min 2X/week       AM-PAC PT 6 Clicks Mobility   Outcome Measure  Help needed turning from your back to your side while in a flat bed without using bedrails?: A Little Help needed moving from lying on your back  to sitting on the side of a flat bed without using bedrails?: A Little Help needed moving to and from a bed to a chair  (including a wheelchair)?: A Little Help needed standing up from a chair using your arms (e.g., wheelchair or bedside chair)?: A Little Help needed to walk in hospital room?: A Little Help needed climbing 3-5 steps with a railing? : A Lot 6 Click Score: 17    End of Session   Activity Tolerance: Patient tolerated treatment well Patient left: in bed;with call bell/phone within reach;with bed alarm set;with nursing/sitter in room Nurse Communication: Mobility status PT Visit Diagnosis: Unsteadiness on feet (R26.81);Other abnormalities of gait and mobility (R26.89);Muscle weakness (generalized) (M62.81);History of falling (Z91.81)     Time: 9604-5409 PT Time Calculation (min) (ACUTE ONLY): 19 min  Charges:    $Gait Training: 8-22 mins PT General Charges $$ ACUTE PT VISIT: 1 Visit                    Chester Costa PTA 09/29/23, 3:58 PM

## 2023-09-29 NOTE — Plan of Care (Signed)

## 2023-09-30 DIAGNOSIS — I4891 Unspecified atrial fibrillation: Secondary | ICD-10-CM | POA: Diagnosis not present

## 2023-09-30 LAB — COMPREHENSIVE METABOLIC PANEL WITH GFR
ALT: 33 U/L (ref 0–44)
AST: 69 U/L — ABNORMAL HIGH (ref 15–41)
Albumin: 1.9 g/dL — ABNORMAL LOW (ref 3.5–5.0)
Alkaline Phosphatase: 73 U/L (ref 38–126)
Anion gap: 7 (ref 5–15)
BUN: 15 mg/dL (ref 8–23)
CO2: 26 mmol/L (ref 22–32)
Calcium: 8.2 mg/dL — ABNORMAL LOW (ref 8.9–10.3)
Chloride: 101 mmol/L (ref 98–111)
Creatinine, Ser: 1.14 mg/dL — ABNORMAL HIGH (ref 0.44–1.00)
GFR, Estimated: 52 mL/min — ABNORMAL LOW (ref >60)
Glucose, Bld: 164 mg/dL — ABNORMAL HIGH (ref 70–99)
Potassium: 4 mmol/L (ref 3.5–5.1)
Sodium: 134 mmol/L — ABNORMAL LOW (ref 135–145)
Total Bilirubin: 1.2 mg/dL (ref 0.0–1.2)
Total Protein: 5.9 g/dL — ABNORMAL LOW (ref 6.5–8.1)

## 2023-09-30 LAB — CBC
HCT: 36.7 % (ref 36.0–46.0)
Hemoglobin: 12.1 g/dL (ref 12.0–15.0)
MCH: 33.5 pg (ref 26.0–34.0)
MCHC: 33 g/dL (ref 30.0–36.0)
MCV: 101.7 fL — ABNORMAL HIGH (ref 80.0–100.0)
Platelets: 89 10*3/uL — ABNORMAL LOW (ref 150–400)
RBC: 3.61 MIL/uL — ABNORMAL LOW (ref 3.87–5.11)
RDW: 15.8 % — ABNORMAL HIGH (ref 11.5–15.5)
WBC: 4 10*3/uL (ref 4.0–10.5)
nRBC: 0 % (ref 0.0–0.2)

## 2023-09-30 LAB — GLUCOSE, CAPILLARY
Glucose-Capillary: 94 mg/dL (ref 70–99)
Glucose-Capillary: 211 mg/dL — ABNORMAL HIGH (ref 70–99)
Glucose-Capillary: 186 mg/dL — ABNORMAL HIGH (ref 70–99)
Glucose-Capillary: 203 mg/dL — ABNORMAL HIGH (ref 70–99)

## 2023-09-30 LAB — AMMONIA: Ammonia: 58 umol/L — ABNORMAL HIGH (ref 9–35)

## 2023-09-30 NOTE — Progress Notes (Signed)
 Physical Therapy Treatment Patient Details Name: SANAA ZILBERMAN MRN: [id number] DOB: [birth year] Today's Date: 09/30/2023   History of Present Illness NALINI ALCARAZ is a 68 y.o. female with medical history significant of  HTN, HLD, DM, dCHF, gout, depression, CKD-3A, A-fib on Eliquis , alcoholic cirrhosis, thrombocytopenia, hepatocellular carcinoma, chronic diarrhea, alcohol  abuse, who presents with weakness and low blood sugar    PT Comments  Pt was seated EOB upon arrival. She is A and O and agreeable to session. Does well standing from elevated surface heights however struggles form low/standard heights. Pt's HR did not elevate as high with about the same activity perform previous date. HR peaked 124 during ambulation with RW. Vcs throughout gait training for erecting posture and improving heel strike. Overall pt tolerates session well. Unwilling to sit in recliner due to sacral region pain/ LBP. DC recs remain appropriate to maximize independence and safety with all ADLs while assisting pt to PLOF. Pt lives alone and still requesting assistance for bed mobility and transfers.    If plan is discharge home, recommend the following: A little help with walking and/or transfers;A little help with bathing/dressing/bathroom;Assistance with cooking/housework;Assistance with feeding;Direct supervision/assist for medications management;Direct supervision/assist for financial management;Assist for transportation;Help with stairs or ramp for entrance;Supervision due to cognitive status     Equipment Recommendations  None recommended by PT       Precautions / Restrictions Precautions Precautions: Fall Recall of Precautions/Restrictions: Impaired Restrictions Weight Bearing Restrictions Per Provider Order: No     Mobility  Bed Mobility Overal bed mobility: Needs Assistance Bed Mobility: Sit to Supine  Supine to sit: Min assist Sit to supine: Min assist     Transfers Overall transfer level:  Needs assistance Equipment used: Rolling walker (2 wheels) Transfers: Sit to/from Stand Sit to Stand: Contact guard assist, From elevated surface, Mod assist  General transfer comment: CGA for safety with vcs for handplacement and technique improvements from elevated surface however mod assist from lowest bed height.    Ambulation/Gait Ambulation/Gait assistance: Contact guard assist Gait Distance (Feet): 70 Feet Assistive device: Rolling walker (2 wheels) Gait Pattern/deviations: Step-through pattern, Trunk flexed Gait velocity: decreased  General Gait Details: Pt was able to ambulate ~ 70 ft with RW. distance limited by fatigue. HR less tachy today versus previous date. Peak HR at 124 bpm    Balance Overall balance assessment: Needs assistance Sitting-balance support: Feet supported Sitting balance-Leahy Scale: Good     Standing balance support: Bilateral upper extremity supported Standing balance-Leahy Scale: Fair Standing balance comment: Use of RW, no LOB experienced       Communication Communication Communication: No apparent difficulties  Cognition Arousal: Alert Behavior During Therapy: WFL for tasks assessed/performed   PT - Cognitive impairments: Safety/Judgement    PT - Cognition Comments: Pt is A and O x 4. Following commands: Intact      Cueing Cueing Techniques: Verbal cues, Tactile cues    PT Goals (current goals can now be found in the care plan section) Acute Rehab PT Goals Patient Stated Goal: to improve mobility and return to PLOF Progress towards PT goals: Progressing toward goals    Frequency    Min 2X/week       AM-PAC PT 6 Clicks Mobility   Outcome Measure  Help needed turning from your back to your side while in a flat bed without using bedrails?: A Little Help needed moving from lying on your back to sitting on the side of a flat bed without using bedrails?:  A Little Help needed moving to and from a bed to a chair (including a  wheelchair)?: A Little Help needed standing up from a chair using your arms (e.g., wheelchair or bedside chair)?: A Little Help needed to walk in hospital room?: A Little Help needed climbing 3-5 steps with a railing? : A Lot 6 Click Score: 17    End of Session   Activity Tolerance: Patient tolerated treatment well Patient left: in bed;with call bell/phone within reach;with bed alarm set;with nursing/sitter in room Nurse Communication: Mobility status PT Visit Diagnosis: Unsteadiness on feet (R26.81);Other abnormalities of gait and mobility (R26.89);Muscle weakness (generalized) (M62.81);History of falling (Z91.81)     Time: 1610-9604 PT Time Calculation (min) (ACUTE ONLY): 22 min  Charges:    $Gait Training: 8-22 mins PT General Charges $$ ACUTE PT VISIT: 1 Visit                    Chester Costa PTA 09/30/23, 10:42 AM

## 2023-09-30 NOTE — Care Management Important Message (Signed)
 Important Message  Patient Details  Name: Adriana Miller MRN: [id number] Date of Birth: 04-07-1956   Important Message Given:  Yes - Medicare IM     Anise Kerns 09/30/2023, 9:15 AM

## 2023-09-30 NOTE — Progress Notes (Signed)
 PROGRESS NOTE    Adriana Miller  ZOX:[id number] DOB: [birth year] DOA: 09/26/2023 PCP: Jacques Mattock, PA-C   Assessment & Plan:   Principal Problem:   Atrial fibrillation with RVR (HCC) Active Problems:   Hypoglycemia   Type II diabetes mellitus with renal manifestations (HCC)   Chronic diarrhea   Alcoholic cirrhosis of liver without ascites (HCC)   Alcohol  use disorder   Essential hypertension   Chronic kidney disease, stage 3a (HCC)   Thrombocytopenia (HCC)   Chronic heart failure with preserved ejection fraction (HFpEF) (HCC)   Depression   Maceration of skin   Overweight (BMI 25.0-29.9)  Assessment and Plan: PAF: w/ RVR intermittently. Rate controlled currently. Continue on metoprolol , eliquis . Continue on tele   DM2: w/ hypoglycemia episodes, possibly secondary to poor po intake. Hypoglycemic episodes have resolved. HbA1c 4.9, well controlled. No need for insulin . Continue to monitor blood sugars closely     Chronic diarrhea: imodium  prn    Alcoholic cirrhosis of liver: continue on home dose of lasix , aldactone . Will need to f/u outpatient w/ GI    Alcohol  use disorder: last drink was 2 months ago as per pt.    HTN: continue on metoprolol , lasix , aldactone      CKDIIIa: Cr is labile. Will continue to monitor     Thrombocytopenia: labile. Likely secondary to cirrhosis    Chronic diastolic CHF: appears compensated. Continue on metoprolol , lasix , aldactone . Monitor I/Os    Depression: severity unknown. Continue on home dose of lexapro     Maceration of skin: will hold off on abxs currently and monitor. Continue w/ supportive       DVT prophylaxis: eliquis   Code Status: DNR Family Communication:  Disposition Plan: likely d/c to SNF  Status is: Inpatient Remains inpatient appropriate because: severity of illness, waiting on insurance auth     Level of care: Progressive Consultants:    Procedures:   Antimicrobials:   Subjective: Pt c/o malaise    Objective: Vitals:   09/29/23 2319 09/30/23 0334 09/30/23 0500 09/30/23 0839  BP: 110/67 106/76  118/84  Pulse: 81 86  93  Resp: 18 18    Temp: 98.2 F (36.8 C) 98.3 F (36.8 C)  97.6 F (36.4 C)  TempSrc:      SpO2: 100% 99%  100%  Weight:   73.9 kg   Height:        Intake/Output Summary (Last 24 hours) at 09/30/2023 8119 Last data filed at 09/29/2023 2100 Gross per 24 hour  Intake --  Output 1150 ml  Net -1150 ml   Filed Weights   09/28/23 0530 09/29/23 0500 09/30/23 0500  Weight: 71 kg 72.6 kg 73.9 kg    Examination:  General exam: Appears calm & comfortable  Respiratory system: clear breath sounds b/l   Cardiovascular system: irregularly irregular  Gastrointestinal system: abd is soft, NT, ND & hypoactive bowel sounds  Central nervous system: alert & oriented. Moves all extremities  Psychiatry: judgement and insight appears at baseline. Flat mood and affect     Data Reviewed: I have personally reviewed following labs and imaging studies  CBC: Recent Labs  Lab 09/26/23 1651 09/27/23 0600 09/29/23 0600 09/30/23 0508  WBC 3.4* 2.6* 4.0 4.0  NEUTROABS 2.6  --   --   --   HGB 14.8 12.2 12.9 12.1  HCT 43.9 36.3 39.3 36.7  MCV 100.0 100.8* 101.8* 101.7*  PLT 92* 73* 81* 89*   Basic Metabolic Panel: Recent Labs  Lab 09/26/23 1651 09/27/23  0600 09/29/23 0600 09/30/23 0508  NA 136 135 135 134*  K 4.5 4.0 4.3 4.0  CL 104 106 102 101  CO2 21* 21* 26 26  GLUCOSE 33* 61* 114* 164*  BUN 10 9 14 15   CREATININE 1.20* 1.07* 1.20* 1.14*  CALCIUM  8.5* 7.9* 8.2* 8.2*  MG 1.9  --   --   --   PHOS 3.6  --   --   --    GFR: Estimated Creatinine Clearance: 47.9 mL/min (A) (by C-G formula based on SCr of 1.14 mg/dL (H)). Liver Function Tests: Recent Labs  Lab 09/26/23 1651 09/29/23 0600 09/30/23 0508  AST 103* 67* 69*  ALT 46* 31 33  ALKPHOS 92 76 73  BILITOT 1.4* 1.4* 1.2  PROT 7.8 6.2* 5.9*  ALBUMIN  2.5* 2.0* 1.9*   Recent Labs  Lab  09/26/23 1651  LIPASE 44   Recent Labs  Lab 09/27/23 0951 09/29/23 0600 09/30/23 0508  AMMONIA 45* 32 58*   Coagulation Profile: Recent Labs  Lab 09/26/23 1651  INR 1.4*   Cardiac Enzymes: No results for input(s): CKTOTAL, CKMB, CKMBINDEX, TROPONINI in the last 168 hours. BNP (last 3 results) No results for input(s): PROBNP in the last 8760 hours. HbA1C: No results for input(s): HGBA1C in the last 72 hours.  CBG: Recent Labs  Lab 09/29/23 0833 09/29/23 1124 09/29/23 1625 09/29/23 2128 09/30/23 0840  GLUCAP 129* 238* 204* 269* 94   Lipid Profile: No results for input(s): CHOL, HDL, LDLCALC, TRIG, CHOLHDL, LDLDIRECT in the last 72 hours. Thyroid  Function Tests: No results for input(s): TSH, T4TOTAL, FREET4, T3FREE, THYROIDAB in the last 72 hours.  Anemia Panel: No results for input(s): VITAMINB12, FOLATE, FERRITIN, TIBC, IRON , RETICCTPCT in the last 72 hours. Sepsis Labs: No results for input(s): PROCALCITON, LATICACIDVEN in the last 168 hours.  Recent Results (from the past 240 hours)  Blood culture (routine x 2)     Status: None (Preliminary result)   Collection Time: 09/26/23  6:47 PM   Specimen: BLOOD RIGHT HAND  Result Value Ref Range Status   Specimen Description BLOOD RIGHT HAND  Final   Special Requests   Final    BOTTLES DRAWN AEROBIC ONLY Blood Culture results may not be optimal due to an inadequate volume of blood received in culture bottles   Culture   Final    NO GROWTH 4 DAYS Performed at [facility], [address], [place], [place] [address]    Report Status PENDING  Incomplete  Blood culture (routine x 2)     Status: None (Preliminary result)   Collection Time: 09/27/23  9:51 AM   Specimen: BLOOD  Result Value Ref Range Status   Specimen Description BLOOD BLOOD LEFT FOREARM  Final   Special Requests   Final    BOTTLES DRAWN AEROBIC AND ANAEROBIC Blood Culture results may not be  optimal due to an inadequate volume of blood received in culture bottles   Culture   Final    NO GROWTH 3 DAYS Performed at [facility], [address], [place], [place] [address]    Report Status PENDING  Incomplete         Radiology Studies: No results found.       Scheduled Meds:  allopurinol   100 mg Oral Daily   apixaban   5 mg Oral BID   escitalopram   5 mg Oral Daily   folic acid   1 mg Oral Daily   furosemide   20 mg Oral Daily  liver oil-zinc  oxide   Topical TID   metoprolol  tartrate  25 mg Oral BID   mirtazapine   7.5 mg Oral QHS   multivitamin with minerals  1 tablet Oral Daily   spironolactone   25 mg Oral Daily   thiamine   100 mg Oral Daily   Continuous Infusions:   LOS: 3 days     Alphonsus Jeans, MD [facility] Pager [phone]  If 7PM-7AM, please contact night-coverage [email or web address] 09/30/2023, 9:28 AM '

## 2023-09-30 NOTE — TOC Progression Note (Addendum)
 Transition of Care [facility]) - Progression Note    Patient Details  Name: Adriana Miller MRN: [id number] Date of Birth: 1956-02-04  [facility] of [facility]) CM/SW Contact  Baird Bombard, RN Phone Number: 09/30/2023, 11:59 AM  Clinical Narrative:    Cesar Collins started for [employer].   2:24pm Cesar Collins pending         Expected Discharge Plan and Services                                               Social Determinants of Health (SDOH) Interventions SDOH Screenings   Food Insecurity: No Food Insecurity (09/27/2023)  Housing: Low Risk  (09/27/2023)  Transportation Needs: No Transportation Needs (09/27/2023)  Utilities: Not At Risk (09/27/2023)  Alcohol  Screen: Low Risk  (06/06/2023)  Depression (PHQ2-9): Low Risk  (01/27/2023)  Social Connections: Moderately Isolated (09/27/2023)  Tobacco Use: High Risk (09/26/2023)    Readmission Risk Interventions    11/20/2022    2:04 PM 10/01/2022    9:15 AM  Readmission Risk Prevention Plan  Transportation Screening Complete Complete  Medication Review [employer]) Complete Complete  PCP or Specialist appointment within 3-5 days of discharge Complete Complete  HRI or Home Care Consult Complete Complete  SW Recovery Care/Counseling Consult Complete Complete  Palliative Care Screening Not Applicable Not Applicable  Skilled Nursing Facility Not Applicable Not Applicable

## 2023-09-30 NOTE — Plan of Care (Signed)

## 2023-10-01 DIAGNOSIS — I4891 Unspecified atrial fibrillation: Secondary | ICD-10-CM | POA: Diagnosis not present

## 2023-10-01 LAB — CULTURE, BLOOD (ROUTINE X 2): Culture: NO GROWTH

## 2023-10-01 LAB — COMPREHENSIVE METABOLIC PANEL WITH GFR
ALT: 30 U/L (ref 0–44)
AST: 55 U/L — ABNORMAL HIGH (ref 15–41)
Albumin: 1.7 g/dL — ABNORMAL LOW (ref 3.5–5.0)
Alkaline Phosphatase: 60 U/L (ref 38–126)
Anion gap: 9 (ref 5–15)
BUN: 15 mg/dL (ref 8–23)
CO2: 25 mmol/L (ref 22–32)
Calcium: 8.1 mg/dL — ABNORMAL LOW (ref 8.9–10.3)
Chloride: 102 mmol/L (ref 98–111)
Creatinine, Ser: 1.15 mg/dL — ABNORMAL HIGH (ref 0.44–1.00)
GFR, Estimated: 52 mL/min — ABNORMAL LOW (ref >60)
Glucose, Bld: 104 mg/dL — ABNORMAL HIGH (ref 70–99)
Potassium: 3.7 mmol/L (ref 3.5–5.1)
Sodium: 136 mmol/L (ref 135–145)
Total Bilirubin: 1 mg/dL (ref 0.0–1.2)
Total Protein: 5.3 g/dL — ABNORMAL LOW (ref 6.5–8.1)

## 2023-10-01 LAB — CBC
HCT: 34.1 % — ABNORMAL LOW (ref 36.0–46.0)
Hemoglobin: 11.4 g/dL — ABNORMAL LOW (ref 12.0–15.0)
MCH: 33.7 pg (ref 26.0–34.0)
MCHC: 33.4 g/dL (ref 30.0–36.0)
MCV: 100.9 fL — ABNORMAL HIGH (ref 80.0–100.0)
Platelets: 82 10*3/uL — ABNORMAL LOW (ref 150–400)
RBC: 3.38 MIL/uL — ABNORMAL LOW (ref 3.87–5.11)
RDW: 15.7 % — ABNORMAL HIGH (ref 11.5–15.5)
WBC: 2.7 10*3/uL — ABNORMAL LOW (ref 4.0–10.5)
nRBC: 0 % (ref 0.0–0.2)

## 2023-10-01 LAB — AMMONIA: Ammonia: 36 umol/L — ABNORMAL HIGH (ref 9–35)

## 2023-10-01 LAB — GLUCOSE, CAPILLARY
Glucose-Capillary: 102 mg/dL — ABNORMAL HIGH (ref 70–99)
Glucose-Capillary: 199 mg/dL — ABNORMAL HIGH (ref 70–99)
Glucose-Capillary: 196 mg/dL — ABNORMAL HIGH (ref 70–99)
Glucose-Capillary: 251 mg/dL — ABNORMAL HIGH (ref 70–99)

## 2023-10-01 NOTE — Progress Notes (Signed)
 PROGRESS NOTE    Adriana Miller  WUJ:[id number] DOB: 05-Jan-1956 DOA: 09/26/2023 PCP: Jacques Mattock, PA-C   Assessment & Plan:   Principal Problem:   Atrial fibrillation with RVR (HCC) Active Problems:   Hypoglycemia   Type II diabetes mellitus with renal manifestations (HCC)   Chronic diarrhea   Alcoholic cirrhosis of liver without ascites (HCC)   Alcohol  use disorder   Essential hypertension   Chronic kidney disease, stage 3a (HCC)   Thrombocytopenia (HCC)   Chronic heart failure with preserved ejection fraction (HFpEF) (HCC)   Depression   Maceration of skin   Overweight (BMI 25.0-29.9)  Assessment and Plan: PAF: w/ RVR intermittently. Rate controlled currently. Continue on metoprolol , eliquis . Continue on tele  DM2: w/ hypoglycemia episodes, possibly secondary to poor po intake. Hypoglycemic episodes have resolved. HbA1c 4.9, well controlled. No need for insulin . Continue to monitor blood sugars   Chronic diarrhea: imodium  prn    Alcoholic cirrhosis of liver: continue on home dose of lasix , aldactone . Will need to f/u outpatient w/ GI    Alcohol  use disorder: last drink was 2 months ago as per pt.    HTN: continue on aldactone , metoprolol , lasix     CKDIIIa: Cr is labile. Will continue to monitor     Thrombocytopenia: labile. Secondary to cirrhosis    Chronic diastolic CHF: appears compensated. Continue on lasix , aldactone  & metoprolol . Monitor I/Os    Depression: severity unknown. Continue on home dose of lexapro     Maceration of skin: will hold off on abxs currently and monitor. Continue w/ supportive care        DVT prophylaxis: eliquis   Code Status: DNR Family Communication:  Disposition Plan: likely d/c to SNF  Status is: Inpatient Remains inpatient appropriate because: medically stable. Waiting on insurance auth      Level of care: Progressive Consultants:    Procedures:   Antimicrobials:   Subjective: Pt c/o fatigue    Objective: Vitals:   09/30/23 1951 10/01/23 0030 10/01/23 0458 10/01/23 0500  BP: (!) 99/59 107/75 (!) 99/59   Pulse: 91 85 77   Resp: 18 16 18    Temp: 98.1 F (36.7 C) 97.9 F (36.6 C) 98.1 F (36.7 C)   TempSrc: Oral     SpO2: 100% 100% 100%   Weight:    74.8 kg  Height:        Intake/Output Summary (Last 24 hours) at 10/01/2023 0851 Last data filed at 10/01/2023 0436 Gross per 24 hour  Intake 360 ml  Output 200 ml  Net 160 ml   Filed Weights   09/29/23 0500 09/30/23 0500 10/01/23 0500  Weight: 72.6 kg 73.9 kg 74.8 kg    Examination:  General exam:appears comfortable  Respiratory system: clear breath sounds b/l  Cardiovascular system: irregularly irregular  Gastrointestinal system: abd is soft, NT, ND & hypoactive bowel sounds   Central nervous system: alert & oriented. Moves all extremities  Psychiatry: judgement and insight appears at baseline. Flat mood and affect      Data Reviewed: I have personally reviewed following labs and imaging studies  CBC: Recent Labs  Lab 09/26/23 1651 09/27/23 0600 09/29/23 0600 09/30/23 0508 10/01/23 0555  WBC 3.4* 2.6* 4.0 4.0 2.7*  NEUTROABS 2.6  --   --   --   --   HGB 14.8 12.2 12.9 12.1 11.4*  HCT 43.9 36.3 39.3 36.7 34.1*  MCV 100.0 100.8* 101.8* 101.7* 100.9*  PLT 92* 73* 81* 89* 82*   Basic Metabolic  Panel: Recent Labs  Lab 09/26/23 1651 09/27/23 0600 09/29/23 0600 09/30/23 0508 10/01/23 0555  NA 136 135 135 134* 136  K 4.5 4.0 4.3 4.0 3.7  CL 104 106 102 101 102  CO2 21* 21* 26 26 25   GLUCOSE 33* 61* 114* 164* 104*  BUN 10 9 14 15 15   CREATININE 1.20* 1.07* 1.20* 1.14* 1.15*  CALCIUM  8.5* 7.9* 8.2* 8.2* 8.1*  MG 1.9  --   --   --   --   PHOS 3.6  --   --   --   --    GFR: Estimated Creatinine Clearance: 47.7 mL/min (A) (by C-G formula based on SCr of 1.15 mg/dL (H)). Liver Function Tests: Recent Labs  Lab 09/26/23 1651 09/29/23 0600 09/30/23 0508 10/01/23 0555  AST 103* 67* 69* 55*   ALT 46* 31 33 30  ALKPHOS 92 76 73 60  BILITOT 1.4* 1.4* 1.2 1.0  PROT 7.8 6.2* 5.9* 5.3*  ALBUMIN  2.5* 2.0* 1.9* 1.7*   Recent Labs  Lab 09/26/23 1651  LIPASE 44   Recent Labs  Lab 09/27/23 0951 09/29/23 0600 09/30/23 0508 10/01/23 0555  AMMONIA 45* 32 58* 36*   Coagulation Profile: Recent Labs  Lab 09/26/23 1651  INR 1.4*   Cardiac Enzymes: No results for input(s): CKTOTAL, CKMB, CKMBINDEX, TROPONINI in the last 168 hours. BNP (last 3 results) No results for input(s): PROBNP in the last 8760 hours. HbA1C: No results for input(s): HGBA1C in the last 72 hours.  CBG: Recent Labs  Lab 09/29/23 2128 09/30/23 0840 09/30/23 1244 09/30/23 1651 09/30/23 1943  GLUCAP 269* 94 211* 186* 203*   Lipid Profile: No results for input(s): CHOL, HDL, LDLCALC, TRIG, CHOLHDL, LDLDIRECT in the last 72 hours. Thyroid  Function Tests: No results for input(s): TSH, T4TOTAL, FREET4, T3FREE, THYROIDAB in the last 72 hours.  Anemia Panel: No results for input(s): VITAMINB12, FOLATE, FERRITIN, TIBC, IRON , RETICCTPCT in the last 72 hours. Sepsis Labs: No results for input(s): PROCALCITON, LATICACIDVEN in the last 168 hours.  Recent Results (from the past 240 hours)  Blood culture (routine x 2)     Status: None   Collection Time: 09/26/23  6:47 PM   Specimen: BLOOD RIGHT HAND  Result Value Ref Range Status   Specimen Description BLOOD RIGHT HAND  Final   Special Requests   Final    BOTTLES DRAWN AEROBIC ONLY Blood Culture results may not be optimal due to an inadequate volume of blood received in culture bottles   Culture   Final    NO GROWTH 5 DAYS Performed at [facility], [address] [place], [place] [address]    Report Status 10/01/2023 FINAL  Final  Blood culture (routine x 2)     Status: None (Preliminary result)   Collection Time: 09/27/23  9:51 AM   Specimen: BLOOD  Result Value Ref Range Status    Specimen Description BLOOD BLOOD LEFT FOREARM  Final   Special Requests   Final    BOTTLES DRAWN AEROBIC AND ANAEROBIC Blood Culture results may not be optimal due to an inadequate volume of blood received in culture bottles   Culture   Final    NO GROWTH 4 DAYS Performed at [facility], [address] [place], [place] [address]    Report Status PENDING  Incomplete         Radiology Studies: No results found.       Scheduled Meds:  allopurinol   100 mg Oral Daily  apixaban   5 mg Oral BID   escitalopram   5 mg Oral Daily   folic acid   1 mg Oral Daily   furosemide   20 mg Oral Daily   liver oil-zinc  oxide   Topical TID   metoprolol  tartrate  25 mg Oral BID   mirtazapine   7.5 mg Oral QHS   multivitamin with minerals  1 tablet Oral Daily   spironolactone   25 mg Oral Daily   thiamine   100 mg Oral Daily   Continuous Infusions:   LOS: 4 days     Alphonsus Jeans, MD [facility] Pager [phone]  If 7PM-7AM, please contact night-coverage [email or web address] 10/01/2023, 8:51 AM '

## 2023-10-02 DIAGNOSIS — I4891 Unspecified atrial fibrillation: Secondary | ICD-10-CM | POA: Diagnosis not present

## 2023-10-02 LAB — COMPREHENSIVE METABOLIC PANEL WITH GFR
ALT: 32 U/L (ref 0–44)
AST: 67 U/L — ABNORMAL HIGH (ref 15–41)
Albumin: 2 g/dL — ABNORMAL LOW (ref 3.5–5.0)
Alkaline Phosphatase: 77 U/L (ref 38–126)
Anion gap: 6 (ref 5–15)
BUN: 19 mg/dL (ref 8–23)
CO2: 28 mmol/L (ref 22–32)
Calcium: 8.2 mg/dL — ABNORMAL LOW (ref 8.9–10.3)
Chloride: 100 mmol/L (ref 98–111)
Creatinine, Ser: 1.27 mg/dL — ABNORMAL HIGH (ref 0.44–1.00)
GFR, Estimated: 45 mL/min — ABNORMAL LOW (ref >60)
Glucose, Bld: 178 mg/dL — ABNORMAL HIGH (ref 70–99)
Potassium: 3.7 mmol/L (ref 3.5–5.1)
Sodium: 134 mmol/L — ABNORMAL LOW (ref 135–145)
Total Bilirubin: 0.9 mg/dL (ref 0.0–1.2)
Total Protein: 6.2 g/dL — ABNORMAL LOW (ref 6.5–8.1)

## 2023-10-02 LAB — CULTURE, BLOOD (ROUTINE X 2): Culture: NO GROWTH

## 2023-10-02 LAB — GLUCOSE, CAPILLARY
Glucose-Capillary: 188 mg/dL — ABNORMAL HIGH (ref 70–99)
Glucose-Capillary: 207 mg/dL — ABNORMAL HIGH (ref 70–99)

## 2023-10-02 LAB — CBC
HCT: 36.1 % (ref 36.0–46.0)
Hemoglobin: 12 g/dL (ref 12.0–15.0)
MCH: 34.1 pg — ABNORMAL HIGH (ref 26.0–34.0)
MCHC: 33.2 g/dL (ref 30.0–36.0)
MCV: 102.6 fL — ABNORMAL HIGH (ref 80.0–100.0)
Platelets: 90 10*3/uL — ABNORMAL LOW (ref 150–400)
RBC: 3.52 MIL/uL — ABNORMAL LOW (ref 3.87–5.11)
RDW: 15.9 % — ABNORMAL HIGH (ref 11.5–15.5)
WBC: 3.6 10*3/uL — ABNORMAL LOW (ref 4.0–10.5)
nRBC: 0 % (ref 0.0–0.2)

## 2023-10-02 LAB — AMMONIA: Ammonia: 46 umol/L — ABNORMAL HIGH (ref 9–35)

## 2023-10-02 NOTE — Progress Notes (Signed)
 PROGRESS NOTE    Adriana Miller  EAV:[id number] DOB: June 08, 1955 DOA: 09/26/2023 PCP: Jacques Mattock, PA-C   Assessment & Plan:   Principal Problem:   Atrial fibrillation with RVR (HCC) Active Problems:   Hypoglycemia   Type II diabetes mellitus with renal manifestations (HCC)   Chronic diarrhea   Alcoholic cirrhosis of liver without ascites (HCC)   Alcohol  use disorder   Essential hypertension   Chronic kidney disease, stage 3a (HCC)   Thrombocytopenia (HCC)   Chronic heart failure with preserved ejection fraction (HFpEF) (HCC)   Depression   Maceration of skin   Overweight (BMI 25.0-29.9)  Assessment and Plan: PAF: w/ RVR intermittently. Rate controlled currently. Continue on eliquis , metoprolol . Continue on tele  DM2: w/ hypoglycemia episodes, possibly secondary to poor po intake. Hypoglycemic episodes have resolved. HbA1c 4.9, well controlled. No need for insulin . Continue to monitor blood sugars   Chronic diarrhea: imodium  prn    Alcoholic cirrhosis of liver: continue on home dose of aldactone , lasix . Will need to f/u outpatient w/ GI    Alcohol  use disorder: last drink was 2 months ago as per pt.    HTN: continue on metoprolol , aldactone , lasix     CKDIIIa: Cr is labile. Avoid nephrotoxic meds    Thrombocytopenia: labile. Likely secondary to cirrhosis    Chronic diastolic CHF: appears compensated. Continue on lasix , metoprolol , aldactone .  Monitor I/Os   Depression: severity unknown. Continue on home dose of lexapro     Maceration of skin: will hold off on abxs currently and monitor. Continue w/ supportive care         DVT prophylaxis: eliquis   Code Status: DNR Family Communication:  Disposition Plan: likely d/c to SNF  Status is: Inpatient Remains inpatient appropriate because: medically stable. Still waiting on insurance auth     Level of care: Progressive Consultants:    Procedures:   Antimicrobials:   Subjective: Pt c/o malaise    Objective: Vitals:   10/01/23 2359 10/02/23 0418 10/02/23 0500 10/02/23 0815  BP: 109/61 111/72  107/62  Pulse: 78 70  79  Resp: 19 18    Temp: 98 F (36.7 C) (!) 97.4 F (36.3 C)  97.7 F (36.5 C)  TempSrc: Oral Oral    SpO2: 100% 100%  100%  Weight:   77.1 kg   Height:        Intake/Output Summary (Last 24 hours) at 10/02/2023 0839 Last data filed at 10/02/2023 0415 Gross per 24 hour  Intake 240 ml  Output 1000 ml  Net -760 ml   Filed Weights   09/30/23 0500 10/01/23 0500 10/02/23 0500  Weight: 73.9 kg 74.8 kg 77.1 kg    Examination:  General exam: appears calm & comfortable  Respiratory system: clear breath sounds b/l  Cardiovascular system:  irregularly irregular  Gastrointestinal system: abd is soft, NT, ND & normal bowel sounds   Central nervous system: alert & oriented. Moves all extremities  Psychiatry: judgement and insight appears at baseline. Flat mood and affect     Data Reviewed: I have personally reviewed following labs and imaging studies  CBC: Recent Labs  Lab 09/26/23 1651 09/27/23 0600 09/29/23 0600 09/30/23 0508 10/01/23 0555 10/02/23 0455  WBC 3.4* 2.6* 4.0 4.0 2.7* 3.6*  NEUTROABS 2.6  --   --   --   --   --   HGB 14.8 12.2 12.9 12.1 11.4* 12.0  HCT 43.9 36.3 39.3 36.7 34.1* 36.1  MCV 100.0 100.8* 101.8* 101.7* 100.9* 102.6*  PLT 92* 73* 81* 89* 82* 90*   Basic Metabolic Panel: Recent Labs  Lab 09/26/23 1651 09/27/23 0600 09/29/23 0600 09/30/23 0508 10/01/23 0555 10/02/23 0455  NA 136 135 135 134* 136 134*  K 4.5 4.0 4.3 4.0 3.7 3.7  CL 104 106 102 101 102 100  CO2 21* 21* 26 26 25 28   GLUCOSE 33* 61* 114* 164* 104* 178*  BUN 10 9 14 15 15 19   CREATININE 1.20* 1.07* 1.20* 1.14* 1.15* 1.27*  CALCIUM  8.5* 7.9* 8.2* 8.2* 8.1* 8.2*  MG 1.9  --   --   --   --   --   PHOS 3.6  --   --   --   --   --    GFR: Estimated Creatinine Clearance: 43.2 mL/min (A) (by C-G formula based on SCr of 1.27 mg/dL (H)). Liver Function  Tests: Recent Labs  Lab 09/26/23 1651 09/29/23 0600 09/30/23 0508 10/01/23 0555 10/02/23 0455  AST 103* 67* 69* 55* 67*  ALT 46* 31 33 30 32  ALKPHOS 92 76 73 60 77  BILITOT 1.4* 1.4* 1.2 1.0 0.9  PROT 7.8 6.2* 5.9* 5.3* 6.2*  ALBUMIN  2.5* 2.0* 1.9* 1.7* 2.0*   Recent Labs  Lab 09/26/23 1651  LIPASE 44   Recent Labs  Lab 09/27/23 0951 09/29/23 0600 09/30/23 0508 10/01/23 0555 10/02/23 0455  AMMONIA 45* 32 58* 36* 46*   Coagulation Profile: Recent Labs  Lab 09/26/23 1651  INR 1.4*   Cardiac Enzymes: No results for input(s): CKTOTAL, CKMB, CKMBINDEX, TROPONINI in the last 168 hours. BNP (last 3 results) No results for input(s): PROBNP in the last 8760 hours. HbA1C: No results for input(s): HGBA1C in the last 72 hours.  CBG: Recent Labs  Lab 09/30/23 1943 10/01/23 0849 10/01/23 1238 10/01/23 1702 10/01/23 2038  GLUCAP 203* 102* 199* 196* 251*   Lipid Profile: No results for input(s): CHOL, HDL, LDLCALC, TRIG, CHOLHDL, LDLDIRECT in the last 72 hours. Thyroid  Function Tests: No results for input(s): TSH, T4TOTAL, FREET4, T3FREE, THYROIDAB in the last 72 hours.  Anemia Panel: No results for input(s): VITAMINB12, FOLATE, FERRITIN, TIBC, IRON , RETICCTPCT in the last 72 hours. Sepsis Labs: No results for input(s): PROCALCITON, LATICACIDVEN in the last 168 hours.  Recent Results (from the past 240 hours)  Blood culture (routine x 2)     Status: None   Collection Time: 09/26/23  6:47 PM   Specimen: BLOOD RIGHT HAND  Result Value Ref Range Status   Specimen Description BLOOD RIGHT HAND  Final   Special Requests   Final    BOTTLES DRAWN AEROBIC ONLY Blood Culture results may not be optimal due to an inadequate volume of blood received in culture bottles   Culture   Final    NO GROWTH 5 DAYS Performed at [facility], [address] [place], [place] [address]    Report Status 10/01/2023 FINAL   Final  Blood culture (routine x 2)     Status: None   Collection Time: 09/27/23  9:51 AM   Specimen: BLOOD  Result Value Ref Range Status   Specimen Description BLOOD BLOOD LEFT FOREARM  Final   Special Requests   Final    BOTTLES DRAWN AEROBIC AND ANAEROBIC Blood Culture results may not be optimal due to an inadequate volume of blood received in culture bottles   Culture   Final    NO GROWTH 5 DAYS Performed at [facility], [address], [place], [place] [address]  Report Status 10/02/2023 FINAL  Final         Radiology Studies: No results found.       Scheduled Meds:  allopurinol   100 mg Oral Daily   apixaban   5 mg Oral BID   escitalopram   5 mg Oral Daily   folic acid   1 mg Oral Daily   furosemide   20 mg Oral Daily   liver oil-zinc  oxide   Topical TID   metoprolol  tartrate  25 mg Oral BID   mirtazapine   7.5 mg Oral QHS   multivitamin with minerals  1 tablet Oral Daily   spironolactone   25 mg Oral Daily   thiamine   100 mg Oral Daily   Continuous Infusions:   LOS: 5 days     Alphonsus Jeans, MD [facility] Pager [phone]  If 7PM-7AM, please contact night-coverage [email or web address] 10/02/2023, 8:39 AM '

## 2023-10-03 ENCOUNTER — Ambulatory Visit: Payer: 59 | Admitting: Physician Assistant

## 2023-10-03 DIAGNOSIS — G9341 Metabolic encephalopathy: Secondary | ICD-10-CM | POA: Diagnosis not present

## 2023-10-03 DIAGNOSIS — D696 Thrombocytopenia, unspecified: Secondary | ICD-10-CM | POA: Diagnosis not present

## 2023-10-03 DIAGNOSIS — G4700 Insomnia, unspecified: Secondary | ICD-10-CM | POA: Diagnosis not present

## 2023-10-03 DIAGNOSIS — C220 Liver cell carcinoma: Secondary | ICD-10-CM | POA: Diagnosis not present

## 2023-10-03 DIAGNOSIS — L89152 Pressure ulcer of sacral region, stage 2: Secondary | ICD-10-CM | POA: Diagnosis not present

## 2023-10-03 DIAGNOSIS — Z7901 Long term (current) use of anticoagulants: Secondary | ICD-10-CM | POA: Diagnosis not present

## 2023-10-03 DIAGNOSIS — K529 Noninfective gastroenteritis and colitis, unspecified: Secondary | ICD-10-CM | POA: Diagnosis not present

## 2023-10-03 DIAGNOSIS — I130 Hypertensive heart and chronic kidney disease with heart failure and stage 1 through stage 4 chronic kidney disease, or unspecified chronic kidney disease: Secondary | ICD-10-CM | POA: Diagnosis not present

## 2023-10-03 DIAGNOSIS — K5900 Constipation, unspecified: Secondary | ICD-10-CM | POA: Diagnosis not present

## 2023-10-03 DIAGNOSIS — N1831 Chronic kidney disease, stage 3a: Secondary | ICD-10-CM | POA: Diagnosis not present

## 2023-10-03 DIAGNOSIS — I5032 Chronic diastolic (congestive) heart failure: Secondary | ICD-10-CM | POA: Diagnosis not present

## 2023-10-03 DIAGNOSIS — E1122 Type 2 diabetes mellitus with diabetic chronic kidney disease: Secondary | ICD-10-CM | POA: Diagnosis not present

## 2023-10-03 DIAGNOSIS — I5022 Chronic systolic (congestive) heart failure: Secondary | ICD-10-CM | POA: Diagnosis not present

## 2023-10-03 DIAGNOSIS — N183 Chronic kidney disease, stage 3 unspecified: Secondary | ICD-10-CM | POA: Diagnosis not present

## 2023-10-03 DIAGNOSIS — E785 Hyperlipidemia, unspecified: Secondary | ICD-10-CM | POA: Diagnosis not present

## 2023-10-03 DIAGNOSIS — I480 Paroxysmal atrial fibrillation: Secondary | ICD-10-CM | POA: Diagnosis not present

## 2023-10-03 DIAGNOSIS — I4891 Unspecified atrial fibrillation: Secondary | ICD-10-CM | POA: Diagnosis not present

## 2023-10-03 DIAGNOSIS — M109 Gout, unspecified: Secondary | ICD-10-CM | POA: Diagnosis not present

## 2023-10-03 DIAGNOSIS — E876 Hypokalemia: Secondary | ICD-10-CM | POA: Diagnosis not present

## 2023-10-03 DIAGNOSIS — Z7401 Bed confinement status: Secondary | ICD-10-CM | POA: Diagnosis not present

## 2023-10-03 LAB — COMPREHENSIVE METABOLIC PANEL WITH GFR
ALT: 31 U/L (ref 0–44)
AST: 67 U/L — ABNORMAL HIGH (ref 15–41)
Albumin: 1.9 g/dL — ABNORMAL LOW (ref 3.5–5.0)
Alkaline Phosphatase: 83 U/L (ref 38–126)
Anion gap: 9 (ref 5–15)
BUN: 18 mg/dL (ref 8–23)
CO2: 27 mmol/L (ref 22–32)
Calcium: 8.1 mg/dL — ABNORMAL LOW (ref 8.9–10.3)
Chloride: 100 mmol/L (ref 98–111)
Creatinine, Ser: 1.34 mg/dL — ABNORMAL HIGH (ref 0.44–1.00)
GFR, Estimated: 43 mL/min — ABNORMAL LOW (ref >60)
Glucose, Bld: 233 mg/dL — ABNORMAL HIGH (ref 70–99)
Potassium: 3.9 mmol/L (ref 3.5–5.1)
Sodium: 136 mmol/L (ref 135–145)
Total Bilirubin: 0.7 mg/dL (ref 0.0–1.2)
Total Protein: 5.7 g/dL — ABNORMAL LOW (ref 6.5–8.1)

## 2023-10-03 LAB — CBC
HCT: 35.3 % — ABNORMAL LOW (ref 36.0–46.0)
Hemoglobin: 11.9 g/dL — ABNORMAL LOW (ref 12.0–15.0)
MCH: 33.7 pg (ref 26.0–34.0)
MCHC: 33.7 g/dL (ref 30.0–36.0)
MCV: 100 fL (ref 80.0–100.0)
Platelets: 61 10*3/uL — ABNORMAL LOW (ref 150–400)
RBC: 3.53 MIL/uL — ABNORMAL LOW (ref 3.87–5.11)
RDW: 15.6 % — ABNORMAL HIGH (ref 11.5–15.5)
WBC: 3.8 10*3/uL — ABNORMAL LOW (ref 4.0–10.5)
nRBC: 0 % (ref 0.0–0.2)

## 2023-10-03 LAB — GLUCOSE, CAPILLARY
Glucose-Capillary: 164 mg/dL — ABNORMAL HIGH (ref 70–99)
Glucose-Capillary: 219 mg/dL — ABNORMAL HIGH (ref 70–99)
Glucose-Capillary: 209 mg/dL — ABNORMAL HIGH (ref 70–99)
Glucose-Capillary: 249 mg/dL — ABNORMAL HIGH (ref 70–99)

## 2023-10-03 LAB — AMMONIA: Ammonia: 37 umol/L — ABNORMAL HIGH (ref 9–35)

## 2023-10-03 MED ORDER — METOPROLOL TARTRATE 25 MG PO TABS: 25.0000 mg | ORAL_TABLET | Freq: Two times a day (BID) | ORAL | 0 refills | Status: DC

## 2023-10-03 NOTE — Discharge Instructions (Signed)
 Follow up with PCP and pick up medications from pharmacy. Make sure to take all antibiotics. Do not double up on narcotic/pain medication or drink alcohol during this time. Do not drive while taking narcotic medication. Call 911 or return to ER for life threatening issues or other concerns.

## 2023-10-03 NOTE — Progress Notes (Addendum)
 Attempted x2 to call [facility] to give report at phone number given by TOC -[phone]. Phone rang with no answer to phone call. Notified TOC.   On attempt number 3, report was able to be given to Deirdre RN at [facility]. EMS at bedside at this time to take patient. Questions asked and answered.

## 2023-10-03 NOTE — Progress Notes (Signed)
 Physical Therapy Treatment Patient Details Name: Adriana Miller MRN: [id number] DOB: 1955/09/27 Today's Date: 10/03/2023   History of Present Illness Adriana Miller is a 68 y.o. female with medical history significant of  HTN, HLD, DM, dCHF, gout, depression, CKD-3A, A-fib on Eliquis , alcoholic cirrhosis, thrombocytopenia, hepatocellular carcinoma, chronic diarrhea, alcohol  abuse, who presents with weakness and low blood sugar    PT Comments  Pt received in Semi-Fowler's position and agreeable to therapy.  Pt able to perform about the same with transitions/bed mobility as in prior session.  Upon standing, pt noted she needed to void and transitioned to the bathroom.  Pt then proceeded to ambulate in the hallway and made it a point to ambulate just a little further than the last session, ambulating ~20' extra (~90' total).  Pt then returned to the recliner however was uncomfortable and transitioned back to the bed.  Pt left with all needs met and call bell within reach.  Discharge recommendations still appropriate at this time.   If plan is discharge home, recommend the following: A little help with walking and/or transfers;A little help with bathing/dressing/bathroom;Assistance with cooking/housework;Assistance with feeding;Direct supervision/assist for medications management;Direct supervision/assist for financial management;Assist for transportation;Help with stairs or ramp for entrance;Supervision due to cognitive status   Can travel by private vehicle        Equipment Recommendations  None recommended by PT    Recommendations for Other Services       Precautions / Restrictions Precautions Precautions: Fall Recall of Precautions/Restrictions: Impaired Restrictions Weight Bearing Restrictions Per Provider Order: No     Mobility  Bed Mobility Overal bed mobility: Needs Assistance Bed Mobility: Sit to Supine     Supine to sit: Min assist Sit to supine: Min assist   General  bed mobility comments: verbal and tactile cueing on placement of UE and LE, HOB raised and use of rails    Transfers Overall transfer level: Needs assistance Equipment used: Rolling walker (2 wheels) Transfers: Sit to/from Stand Sit to Stand: Contact guard assist, From elevated surface, Mod assist   Step pivot transfers: Contact guard assist       General transfer comment: Pt required modA from lowest bed height, however improved with slightly elevated surface when attempting to stand and needed no physical assistance with elevated bed, just CGa for safety.    Ambulation/Gait Ambulation/Gait assistance: Contact guard assist Gait Distance (Feet): 90 Feet Assistive device: Rolling walker (2 wheels) Gait Pattern/deviations: Step-through pattern, Trunk flexed Gait velocity: decreased     General Gait Details: Pt able to ambulate 4' with RW and also utilize the restroom prior to ambulation.  HR maintained in the 110's, spiking to 133 at the peak.   Stairs             Wheelchair Mobility     Tilt Bed    Modified Rankin (Stroke Patients Only)       Balance Overall balance assessment: Needs assistance Sitting-balance support: Feet supported Sitting balance-Leahy Scale: Good     Standing balance support: Bilateral upper extremity supported Standing balance-Leahy Scale: Fair Standing balance comment: Use of RW, no LOB experienced                            Communication Communication Communication: No apparent difficulties  Cognition Arousal: Alert Behavior During Therapy: WFL for tasks assessed/performed   PT - Cognitive impairments: Safety/Judgement  PT - Cognition Comments: Pt is A and O x 4. Following commands: Intact      Cueing Cueing Techniques: Verbal cues, Tactile cues  Exercises      General Comments        Pertinent Vitals/Pain Pain Assessment Pain Assessment: No/denies pain    Home Living                           Prior Function            PT Goals (current goals can now be found in the care plan section) Acute Rehab PT Goals Patient Stated Goal: to improve mobility and return to PLOF PT Goal Formulation: With patient Time For Goal Achievement: 10/11/23 Potential to Achieve Goals: Good Progress towards PT goals: Progressing toward goals    Frequency    Min 2X/week      PT Plan      Co-evaluation              AM-PAC PT 6 Clicks Mobility   Outcome Measure  Help needed turning from your back to your side while in a flat bed without using bedrails?: A Little Help needed moving from lying on your back to sitting on the side of a flat bed without using bedrails?: A Little Help needed moving to and from a bed to a chair (including a wheelchair)?: A Little Help needed standing up from a chair using your arms (e.g., wheelchair or bedside chair)?: A Little Help needed to walk in hospital room?: A Little Help needed climbing 3-5 steps with a railing? : A Lot 6 Click Score: 17    End of Session Equipment Utilized During Treatment: Gait belt Activity Tolerance: Patient tolerated treatment well;Patient limited by fatigue Patient left: in bed;with call bell/phone within reach;with bed alarm set;with nursing/sitter in room   PT Visit Diagnosis: Unsteadiness on feet (R26.81);Other abnormalities of gait and mobility (R26.89);Muscle weakness (generalized) (M62.81);History of falling (Z91.81)     Time: 6045-4098 PT Time Calculation (min) (ACUTE ONLY): 22 min  Charges:    $Therapeutic Activity: 8-22 mins                       Rozanna Corner, PT, DPT Physical Therapist - [facility]  [facility]  10/03/23, 2:22 PM

## 2023-10-03 NOTE — Discharge Summary (Signed)
 Physician Discharge Summary  MAN BONNEAU MVH:[id number] DOB: 18-Nov-1955 DOA: 09/26/2023  PCP: Jacques Mattock, PA-C  Admit date: 09/26/2023 Discharge date: 10/03/2023  Admitted From: home  Disposition: SNF  Recommendations for Outpatient Follow-up:  Follow up with PCP in 1-2 weeks F/u w/ GI in 1-2 weeks w/ hx of cirrhosis   Home Health: no  Equipment/Devices:   Discharge Condition: stable CODE STATUS:DNR Diet recommendation:  Carb Modified   Brief/Interim Summary: HPI was taken from Dr. Rosalea Collin:  Adriana Miller is a 68 y.o. female with medical history significant of  HTN, HLD, DM, dCHF, gout, depression, CKD-3A, A-fib on Eliquis , alcoholic cirrhosis, thrombocytopenia, hepatocellular carcinoma, chronic diarrhea, alcohol  abuse, who presents with weakness and low blood sugar   Patient states that she has generalized weakness and not been feeling well in the past several days, no fall. Pt lives at home by herself and occasionally has caregiver or aides come visit.  Today a neighbor was visiting and patient seemed very weak.  EMS found her with fingerstick glucose of 42. When I saw pt in ED, she is alert oriented x 3.  No unilateral numbness or tingling in extremities. She reports chronic watery diarrhea, causing irritation to her gluteal cleft.  Denies chest pain, cough, SOB.  No nausea, vomiting, abdominal pain.  No symptoms of UTI.  Patient states that she stopped drinking alcohol  2 months ago.   Patient was found to have A-fib with RVR, heart rate 145 on EKG in ED. Her blood sugar is 33 by BMP in ED.   Data reviewed independently and ED Course: pt was found to have WBC 3.4, platelet 92, stable renal function, blood pressure 129/76, RR 21, oxygen saturation 100% on room air, temperature normal.  Chest x-ray negative.  Patient is placed in PCU for observation.     EKG: I have personally reviewed A-fib with RVR, heart rate is 145, QTc 443, low voltage, early R wave question.     Discharge Diagnoses:  Principal Problem:   Atrial fibrillation with RVR (HCC) Active Problems:   Hypoglycemia   Type II diabetes mellitus with renal manifestations (HCC)   Chronic diarrhea   Alcoholic cirrhosis of liver without ascites (HCC)   Alcohol  use disorder   Essential hypertension   Chronic kidney disease, stage 3a (HCC)   Thrombocytopenia (HCC)   Chronic heart failure with preserved ejection fraction (HFpEF) (HCC)   Depression   Maceration of skin   Overweight (BMI 25.0-29.9)  PAF: w/ RVR intermittently. Rate controlled currently. Continue on eliquis , metoprolol . Continue on tele   DM2: w/ hypoglycemia episodes, possibly secondary to poor po intake. Hypoglycemic episodes have resolved. HbA1c 4.9, well controlled. No need for insulin . Continue to monitor blood sugars   Chronic diarrhea: imodium  prn    Alcoholic cirrhosis of liver: continue on home dose of aldactone , lasix . Will need to f/u outpatient w/ GI    Alcohol  use disorder: last drink was 2 months ago as per pt.    HTN: continue on metoprolol , aldactone , lasix     CKDIIIa: Cr is labile. Avoid nephrotoxic meds    Thrombocytopenia: labile. Likely secondary to cirrhosis    Chronic diastolic CHF: appears compensated. Continue on lasix , metoprolol , aldactone .  Monitor I/Os   Depression: severity unknown. Continue on home dose of lexapro     Maceration of skin: will hold off on abxs currently and monitor. Continue w/ wound care  Discharge Instructions  Discharge Instructions     Diet Carb Modified   Complete by:  As directed    Discharge instructions   Complete by: As directed    F/u w/ PCP in 1-2 weeks. F/u w/ GI in 1-2 weeks for hx of cirrhosis   Discharge wound care:   Complete by: As directed    Wound care  3 times daily      Comments: Cleanse sacrum/buttocks with Vashe wound cleanser Timm Foot [phone]) do not rinse and allow to air dry. Apply a thin layer of Desitin to area 3 times daily and prn soiling    Increase activity slowly   Complete by: As directed       Allergies as of 10/03/2023       Reactions   Aspirin Other (See Comments)   Nose bleed        Medication List     STOP taking these medications    ibuprofen  200 MG tablet Commonly known as: ADVIL    insulin  aspart 100 UNIT/ML injection Commonly known as: novoLOG    insulin  glargine-yfgn 100 UNIT/ML injection Commonly known as: SEMGLEE        TAKE these medications    Accu-Chek Guide test strip Generic drug: glucose blood Use as instructed twice a day DX E11.65   Accu-Chek Softclix Lancets lancets USE AS DIRECTED ONCE DAILY   acetaminophen  325 MG tablet Commonly known as: TYLENOL  Take 2 tablets (650 mg total) by mouth every 6 (six) hours as needed for mild pain (or Fever >/= 101).   allopurinol  100 MG tablet Commonly known as: ZYLOPRIM  TAKE 1 TABLET BY MOUTH DAILY   ascorbic acid  500 MG tablet Commonly known as: VITAMIN C  Take 1 tablet (500 mg total) by mouth daily.   B-D SINGLE USE SWABS REGULAR Pads USE AS DIRECTED TWICE DAILY   BD Pen Needle Nano 2nd Gen 32G X 4 MM Misc Generic drug: Insulin  Pen Needle USE AS DIRECTED WITH TOUJEO    Eliquis  5 MG Tabs tablet Generic drug: apixaban  TAKE 1 TABLET BY MOUTH TWICE  DAILY   escitalopram  10 MG tablet Commonly known as: LEXAPRO  TAKE 1 TABLET BY MOUTH DAILY FOR ANXIETY/SLEEP   ferrous sulfate  325 (65 FE) MG tablet Take 1 tablet (325 mg total) by mouth daily with breakfast.   folic acid  1 MG tablet Commonly known as: FOLVITE  Take 1 tablet (1 mg total) by mouth daily.   furosemide  20 MG tablet Commonly known as: LASIX  TAKE 1 TABLET BY MOUTH EVERY DAY   metoprolol  tartrate 25 MG tablet Commonly known as: LOPRESSOR  Take 1 tablet (25 mg total) by mouth 2 (two) times daily. What changed: how much to take   mirtazapine  7.5 MG tablet Commonly known as: REMERON  TAKE 1 TABLET BY MOUTH DAILY AT  BEDTIME   multivitamin with minerals Tabs  tablet Take 1 tablet by mouth daily.   polyethylene glycol 17 g packet Commonly known as: MIRALAX  / GLYCOLAX  Take 17 g by mouth daily.   potassium chloride  8 MEQ tablet Commonly known as: KLOR-CON  TAKE 1 TABLET BY MOUTH 3 TIMES WEEKLY ON MONDAY, WEDNESDAY, AND FRIDAY   spironolactone  25 MG tablet Commonly known as: ALDACTONE  TAKE 1 TABLET BY MOUTH DAILY FOR CIRRHOSIS OF LIVER   thiamine  100 MG tablet Commonly known as: Vitamin B-1 Take 1 tablet (100 mg total) by mouth daily.   Toujeo  SoloStar 300 UNIT/ML Solostar Pen Generic drug: insulin  glargine (1 Unit Dial ) Inject 1-10 Units into the skin at bedtime.   traZODone  50 MG tablet Commonly known as: DESYREL  TAKE 1 TO 2 TABLETS BY MOUTH AT  BEDTIME FOR INSOMNIA               Discharge Care Instructions  (From admission, onward)           Start     Ordered   10/03/23 0000  Discharge wound care:       Comments: Wound care  3 times daily      Comments: Cleanse sacrum/buttocks with Vashe wound cleanser Timm Foot [phone]) do not rinse and allow to air dry. Apply a thin layer of Desitin to area 3 times daily and prn soiling   10/03/23 1231            Allergies  Allergen Reactions   Aspirin Other (See Comments)    Nose bleed    Consultations:   Procedures/Studies: DG Chest Portable 1 View Result Date: 09/26/2023 CLINICAL DATA:  Hypoglycemia. EXAM: PORTABLE CHEST 1 VIEW COMPARISON:  02/14/2023. FINDINGS: Low lung volumes. The heart size and mediastinal contours are within normal limits. Aortic atherosclerosis. No focal consolidation, pleural effusion, or pneumothorax. Remote fracture of the left proximal humerus with marginal callus formation. No acute osseous abnormality identified. IMPRESSION: 1. Low lung volumes.  No acute cardiopulmonary findings. 2. Remote healing fracture of the left proximal humerus. Electronically Signed   By: Mannie Seek M.D.   On: 09/26/2023 18:27   CT Head Wo Contrast Result Date:  09/20/2023 EXAM: CT HEAD AND CERVICAL SPINE 09/20/2023 02:27:28 AM TECHNIQUE: CT of the head and cervical spine was performed without the administration of intravenous contrast. Multiplanar reformatted images are provided for review. Automated exposure control, iterative reconstruction, and/or weight based adjustment of the mA/kV was utilized to reduce the radiation dose to as low as reasonably achievable. COMPARISON: 02/14/2023 CLINICAL HISTORY: Head trauma, minor (Age >= 65y). Pt fell out of bed when attempting to ambulate to bathroom. Pt reports she fell onto her left side/buttocks. FINDINGS: HEAD: BRAIN AND VENTRICLES: There is no acute intracranial hemorrhage, mass effect or midline shift. No abnormal extra-axial fluid collection. The gray-white differentiation is maintained without an acute infarct. There is no hydrocephalus. Global cortical atrophy. Subcortical and periventricular small vessel ischemic changes. Prior right frontoparietal craniotomy. Intracranial atherosclerosis. ORBITS: The visualized portion of the orbits demonstrate no acute abnormality. SINUSES: The visualized paranasal sinuses and mastoid air cells demonstrate no acute abnormality. SOFT TISSUES AND SKULL: No acute abnormality of the visualized skull or soft tissues. CERVICAL SPINE: BONES AND ALIGNMENT: There is no acute fracture or traumatic malalignment. DEGENERATIVE CHANGES: Mild degenerative changes of the mid cervical spine with bulky anterior osteophytosis at C4-5. SOFT TISSUES: There is no prevertebral soft tissue swelling. IMPRESSION: 1. No acute intracranial abnormality. 2. No acute fracture in the cervical spine. Electronically signed by: Zadie Herter MD 09/20/2023 02:40 AM EDT RP Workstation: [id number]   CT Cervical Spine Wo Contrast Result Date: 09/20/2023 EXAM: CT HEAD AND CERVICAL SPINE 09/20/2023 02:27:28 AM TECHNIQUE: CT of the head and cervical spine was performed without the administration of intravenous contrast.  Multiplanar reformatted images are provided for review. Automated exposure control, iterative reconstruction, and/or weight based adjustment of the mA/kV was utilized to reduce the radiation dose to as low as reasonably achievable. COMPARISON: 02/14/2023 CLINICAL HISTORY: Head trauma, minor (Age >= 65y). Pt fell out of bed when attempting to ambulate to bathroom. Pt reports she fell onto her left side/buttocks. FINDINGS: HEAD: BRAIN AND VENTRICLES: There is no acute intracranial hemorrhage, mass effect or midline shift. No abnormal extra-axial fluid collection. The gray-white differentiation is maintained without an  acute infarct. There is no hydrocephalus. Global cortical atrophy. Subcortical and periventricular small vessel ischemic changes. Prior right frontoparietal craniotomy. Intracranial atherosclerosis. ORBITS: The visualized portion of the orbits demonstrate no acute abnormality. SINUSES: The visualized paranasal sinuses and mastoid air cells demonstrate no acute abnormality. SOFT TISSUES AND SKULL: No acute abnormality of the visualized skull or soft tissues. CERVICAL SPINE: BONES AND ALIGNMENT: There is no acute fracture or traumatic malalignment. DEGENERATIVE CHANGES: Mild degenerative changes of the mid cervical spine with bulky anterior osteophytosis at C4-5. SOFT TISSUES: There is no prevertebral soft tissue swelling. IMPRESSION: 1. No acute intracranial abnormality. 2. No acute fracture in the cervical spine. Electronically signed by: Zadie Herter MD 09/20/2023 02:40 AM EDT RP Workstation: [id number]   DG Hips Bilat W or Wo Pelvis 2 Views Result Date: 09/20/2023 EXAM: 2 VIEW(S) XRAY OF THE BILATERAL HIPS 09/20/2023 02:09:54 AM COMPARISON: None available. CLINICAL HISTORY: Fall onto buttock. Pt arrived via [facility] from home where pt fell out of bed when attempting to ambulate to bathroom. Pt reports she fell onto her left side/buttocks. EMS reports pt has day shift staff that help her but not at  night. Pt was able to ambulate to front door to meet EMS when arrived. No obvious deformity noted. Pt also c/o loose stool x2 months. FINDINGS: BONES AND JOINTS: Mild degenerative changes of the bilateral hips. Degenerative changes of the lower lumbar spine. No acute fracture or focal osseous lesion. SOFT TISSUES: Vascular calcifications. The soft tissues are otherwise unremarkable. IMPRESSION: 1. No acute fracture or dislocation. 2. Mild degenerative changes of the bilateral hips. Electronically signed by: Zadie Herter MD 09/20/2023 02:28 AM EDT RP Workstation: [id number]   (Echo, Carotid, EGD, Colonoscopy, ERCP)    Subjective: Pt c/o malaise   Discharge Exam: Vitals:   10/03/23 0817 10/03/23 1212  BP: (!) 113/56 102/73  Pulse: 83 71  Resp: 16   Temp: 98.4 F (36.9 C) 98.2 F (36.8 C)  SpO2: 99% 99%   Vitals:   10/03/23 0410 10/03/23 0500 10/03/23 0817 10/03/23 1212  BP: (!) 102/55  (!) 113/56 102/73  Pulse: 85  83 71  Resp: 18  16   Temp: 98 F (36.7 C)  98.4 F (36.9 C) 98.2 F (36.8 C)  TempSrc: Oral   Oral  SpO2: 98%  99% 99%  Weight:  77.3 kg    Height:        General: Pt is alert, awake, not in acute distress Cardiovascular: irregularly irregular, no rubs, no gallops Respiratory: CTA bilaterally, no wheezing, no rhonchi Abdominal: Soft, NT, ND, bowel sounds + Extremities: no edema, no cyanosis    The results of significant diagnostics from this hospitalization (including imaging, microbiology, ancillary and laboratory) are listed below for reference.     Microbiology: Recent Results (from the past 240 hours)  Blood culture (routine x 2)     Status: None   Collection Time: 09/26/23  6:47 PM   Specimen: BLOOD RIGHT HAND  Result Value Ref Range Status   Specimen Description BLOOD RIGHT HAND  Final   Special Requests   Final    BOTTLES DRAWN AEROBIC ONLY Blood Culture results may not be optimal due to an inadequate volume of blood received in culture  bottles   Culture   Final    NO GROWTH 5 DAYS Performed at [facility], [address] [place], [place] [address]    Report Status 10/01/2023 FINAL  Final  Blood culture (routine x 2)  Status: None   Collection Time: 09/27/23  9:51 AM   Specimen: BLOOD  Result Value Ref Range Status   Specimen Description BLOOD BLOOD LEFT FOREARM  Final   Special Requests   Final    BOTTLES DRAWN AEROBIC AND ANAEROBIC Blood Culture results may not be optimal due to an inadequate volume of blood received in culture bottles   Culture   Final    NO GROWTH 5 DAYS Performed at [facility], [address] [place], [place] [address]    Report Status 10/02/2023 FINAL  Final     Labs: BNP (last 3 results) Recent Labs    11/17/22 2308 09/26/23 1651  BNP 152.5* 137.0*   Basic Metabolic Panel: Recent Labs  Lab 09/26/23 1651 09/27/23 0600 09/29/23 0600 09/30/23 0508 10/01/23 0555 10/02/23 0455 10/03/23 0618  NA 136   < > 135 134* 136 134* 136  K 4.5   < > 4.3 4.0 3.7 3.7 3.9  CL 104   < > 102 101 102 100 100  CO2 21*   < > 26 26 25 28 27   GLUCOSE 33*   < > 114* 164* 104* 178* 233*  BUN 10   < > 14 15 15 19 18   CREATININE 1.20*   < > 1.20* 1.14* 1.15* 1.27* 1.34*  CALCIUM  8.5*   < > 8.2* 8.2* 8.1* 8.2* 8.1*  MG 1.9  --   --   --   --   --   --   PHOS 3.6  --   --   --   --   --   --    < > = values in this interval not displayed.   Liver Function Tests: Recent Labs  Lab 09/29/23 0600 09/30/23 0508 10/01/23 0555 10/02/23 0455 10/03/23 0618  AST 67* 69* 55* 67* 67*  ALT 31 33 30 32 31  ALKPHOS 76 73 60 77 83  BILITOT 1.4* 1.2 1.0 0.9 0.7  PROT 6.2* 5.9* 5.3* 6.2* 5.7*  ALBUMIN  2.0* 1.9* 1.7* 2.0* 1.9*   Recent Labs  Lab 09/26/23 1651  LIPASE 44   Recent Labs  Lab 09/29/23 0600 09/30/23 0508 10/01/23 0555 10/02/23 0455 10/03/23 0618  AMMONIA 32 58* 36* 46* 37*   CBC: Recent Labs  Lab 09/26/23 1651 09/27/23 0600 09/29/23 0600 09/30/23 0508  10/01/23 0555 10/02/23 0455 10/03/23 0430  WBC 3.4*   < > 4.0 4.0 2.7* 3.6* 3.8*  NEUTROABS 2.6  --   --   --   --   --   --   HGB 14.8   < > 12.9 12.1 11.4* 12.0 11.9*  HCT 43.9   < > 39.3 36.7 34.1* 36.1 35.3*  MCV 100.0   < > 101.8* 101.7* 100.9* 102.6* 100.0  PLT 92*   < > 81* 89* 82* 90* 61*   < > = values in this interval not displayed.   Cardiac Enzymes: No results for input(s): CKTOTAL, CKMB, CKMBINDEX, TROPONINI in the last 168 hours. BNP: Invalid input(s): POCBNP CBG: Recent Labs  Lab 10/02/23 1205 10/02/23 1612 10/02/23 2035 10/03/23 0814 10/03/23 1210  GLUCAP 219* 188* 207* 209* 249*   D-Dimer No results for input(s): DDIMER in the last 72 hours. Hgb A1c No results for input(s): HGBA1C in the last 72 hours. Lipid Profile No results for input(s): CHOL, HDL, LDLCALC, TRIG, CHOLHDL, LDLDIRECT in the last 72 hours. Thyroid  function studies No results for input(s): TSH, T4TOTAL, T3FREE, THYROIDAB in the last 72  hours.  Invalid input(s): FREET3 Anemia work up No results for input(s): VITAMINB12, FOLATE, FERRITIN, TIBC, IRON , RETICCTPCT in the last 72 hours. Urinalysis    Component Value Date/Time   COLORURINE YELLOW (A) 09/27/2023 1855   APPEARANCEUR CLEAR (A) 09/27/2023 1855   APPEARANCEUR Hazy 10/11/2013 1705   LABSPEC 1.005 09/27/2023 1855   LABSPEC 1.012 10/11/2013 1705   PHURINE 5.0 09/27/2023 1855   GLUCOSEU NEGATIVE 09/27/2023 1855   GLUCOSEU >=500 10/11/2013 1705   HGBUR NEGATIVE 09/27/2023 1855   BILIRUBINUR NEGATIVE 09/27/2023 1855   BILIRUBINUR Negative 10/11/2013 1705   KETONESUR NEGATIVE 09/27/2023 1855   PROTEINUR NEGATIVE 09/27/2023 1855   NITRITE NEGATIVE 09/27/2023 1855   LEUKOCYTESUR NEGATIVE 09/27/2023 1855   LEUKOCYTESUR 1+ 10/11/2013 1705   Sepsis Labs Recent Labs  Lab 09/30/23 0508 10/01/23 0555 10/02/23 0455 10/03/23 0430  WBC 4.0 2.7* 3.6* 3.8*   Microbiology Recent  Results (from the past 240 hours)  Blood culture (routine x 2)     Status: None   Collection Time: 09/26/23  6:47 PM   Specimen: BLOOD RIGHT HAND  Result Value Ref Range Status   Specimen Description BLOOD RIGHT HAND  Final   Special Requests   Final    BOTTLES DRAWN AEROBIC ONLY Blood Culture results may not be optimal due to an inadequate volume of blood received in culture bottles   Culture   Final    NO GROWTH 5 DAYS Performed at [facility], [address] [place], [place] [address]    Report Status 10/01/2023 FINAL  Final  Blood culture (routine x 2)     Status: None   Collection Time: 09/27/23  9:51 AM   Specimen: BLOOD  Result Value Ref Range Status   Specimen Description BLOOD BLOOD LEFT FOREARM  Final   Special Requests   Final    BOTTLES DRAWN AEROBIC AND ANAEROBIC Blood Culture results may not be optimal due to an inadequate volume of blood received in culture bottles   Culture   Final    NO GROWTH 5 DAYS Performed at [facility], [address], [place], [place] [address]    Report Status 10/02/2023 FINAL  Final     Time coordinating discharge: Over 30 minutes  SIGNED:   Alphonsus Jeans, MD  [facility] 10/03/2023, 12:31 PM Pager   If 7PM-7AM, please contact night-coverage [email or web address]

## 2023-10-03 NOTE — TOC Transition Note (Signed)
 Transition of Care [facility]) - Discharge Note   Patient Details  Name: Adriana Miller MRN: [id number] Date of Birth: [birth year]  Transition of [facility]) CM/SW Contact:  Izaiah Tabb C Jillianna Stanek, RN Phone Number: 10/03/2023, 1:15 PM   Clinical Narrative:    Eldora Greet with Antony Baumgartner  in admissions at [facility]  Per facility patient admission confirmed for today. Patient assigned room # 601 Nurse will call report to [phone] Face sheet and medical necessity forms printed to the floor to be added to the EMS pack EMS arranged She's next on the list. Discharge summary and SNF transfer report sent in HUB.  Nurse, provided details for discharge Patient advised of discharge.  TOC signing off.           Patient Goals and CMS Choice            Discharge Placement                       Discharge Plan and Services Additional resources added to the After Visit Summary for                                       Social Drivers of Health (SDOH) Interventions SDOH Screenings   Food Insecurity: No Food Insecurity (09/27/2023)  Housing: Low Risk  (09/27/2023)  Transportation Needs: No Transportation Needs (09/27/2023)  Utilities: Not At Risk (09/27/2023)  Alcohol  Screen: Low Risk  (06/06/2023)  Depression (PHQ2-9): Low Risk  (01/27/2023)  Social Connections: Moderately Isolated (09/27/2023)  Tobacco Use: High Risk (09/26/2023)     Readmission Risk Interventions    11/20/2022    2:04 PM 10/01/2022    9:15 AM  Readmission Risk Prevention Plan  Transportation Screening Complete Complete  Medication Review Oceanographer) Complete Complete  PCP or Specialist appointment within 3-5 days of discharge Complete Complete  HRI or Home Care Consult Complete Complete  SW Recovery Care/Counseling Consult Complete Complete  Palliative Care Screening Not Applicable Not Applicable  Skilled Nursing Facility Not Applicable Not Applicable

## 2023-10-03 NOTE — TOC Progression Note (Signed)
 Transition of Care [facility]) - Progression Note    Patient Details  Name: Adriana Miller MRN: [id number] Date of Birth: Aug 26, 1955  Transition of Care [facility]) CM/SW Contact  Baird Bombard, RN Phone Number: 10/03/2023, 11:37 AM  Clinical Narrative:   Per [employer] patient auth approved. Auth expires 6/17 Attempt to contact Antony Baumgartner in admissions at [facility], no answer. Message left advising patient is medically stable.  MD notified.          Expected Discharge Plan and Services                                               Social Determinants of Health (SDOH) Interventions SDOH Screenings   Food Insecurity: No Food Insecurity (09/27/2023)  Housing: Low Risk  (09/27/2023)  Transportation Needs: No Transportation Needs (09/27/2023)  Utilities: Not At Risk (09/27/2023)  Alcohol  Screen: Low Risk  (06/06/2023)  Depression (PHQ2-9): Low Risk  (01/27/2023)  Social Connections: Moderately Isolated (09/27/2023)  Tobacco Use: High Risk (09/26/2023)    Readmission Risk Interventions    11/20/2022    2:04 PM 10/01/2022    9:15 AM  Readmission Risk Prevention Plan  Transportation Screening Complete Complete  Medication Review Oceanographer) Complete Complete  PCP or Specialist appointment within 3-5 days of discharge Complete Complete  HRI or Home Care Consult Complete Complete  SW Recovery Care/Counseling Consult Complete Complete  Palliative Care Screening Not Applicable Not Applicable  Skilled Nursing Facility Not Applicable Not Applicable

## 2023-10-05 DIAGNOSIS — E1122 Type 2 diabetes mellitus with diabetic chronic kidney disease: Secondary | ICD-10-CM | POA: Diagnosis not present

## 2023-10-05 DIAGNOSIS — D696 Thrombocytopenia, unspecified: Secondary | ICD-10-CM | POA: Diagnosis not present

## 2023-10-05 DIAGNOSIS — I5022 Chronic systolic (congestive) heart failure: Secondary | ICD-10-CM | POA: Diagnosis not present

## 2023-10-05 DIAGNOSIS — K5900 Constipation, unspecified: Secondary | ICD-10-CM | POA: Diagnosis not present

## 2023-10-05 DIAGNOSIS — I4891 Unspecified atrial fibrillation: Secondary | ICD-10-CM | POA: Diagnosis not present

## 2023-10-05 DIAGNOSIS — I130 Hypertensive heart and chronic kidney disease with heart failure and stage 1 through stage 4 chronic kidney disease, or unspecified chronic kidney disease: Secondary | ICD-10-CM | POA: Diagnosis not present

## 2023-10-05 DIAGNOSIS — M109 Gout, unspecified: Secondary | ICD-10-CM | POA: Diagnosis not present

## 2023-10-05 DIAGNOSIS — L89152 Pressure ulcer of sacral region, stage 2: Secondary | ICD-10-CM | POA: Diagnosis not present

## 2023-10-05 DIAGNOSIS — N183 Chronic kidney disease, stage 3 unspecified: Secondary | ICD-10-CM | POA: Diagnosis not present

## 2023-10-05 DIAGNOSIS — E876 Hypokalemia: Secondary | ICD-10-CM | POA: Diagnosis not present

## 2023-10-06 ENCOUNTER — Telehealth: Payer: Self-pay

## 2023-10-06 NOTE — Telephone Encounter (Signed)
 Pt brother dropped paperwork with tat yesterday for cap service

## 2023-10-07 DIAGNOSIS — N183 Chronic kidney disease, stage 3 unspecified: Secondary | ICD-10-CM | POA: Diagnosis not present

## 2023-10-07 DIAGNOSIS — I4891 Unspecified atrial fibrillation: Secondary | ICD-10-CM | POA: Diagnosis not present

## 2023-10-07 DIAGNOSIS — E1122 Type 2 diabetes mellitus with diabetic chronic kidney disease: Secondary | ICD-10-CM | POA: Diagnosis not present

## 2023-10-07 DIAGNOSIS — K5900 Constipation, unspecified: Secondary | ICD-10-CM | POA: Diagnosis not present

## 2023-10-07 DIAGNOSIS — L89152 Pressure ulcer of sacral region, stage 2: Secondary | ICD-10-CM | POA: Diagnosis not present

## 2023-10-07 DIAGNOSIS — D696 Thrombocytopenia, unspecified: Secondary | ICD-10-CM | POA: Diagnosis not present

## 2023-10-07 DIAGNOSIS — E876 Hypokalemia: Secondary | ICD-10-CM | POA: Diagnosis not present

## 2023-10-07 DIAGNOSIS — I5022 Chronic systolic (congestive) heart failure: Secondary | ICD-10-CM | POA: Diagnosis not present

## 2023-10-07 DIAGNOSIS — I130 Hypertensive heart and chronic kidney disease with heart failure and stage 1 through stage 4 chronic kidney disease, or unspecified chronic kidney disease: Secondary | ICD-10-CM | POA: Diagnosis not present

## 2023-10-10 DIAGNOSIS — E876 Hypokalemia: Secondary | ICD-10-CM | POA: Diagnosis not present

## 2023-10-10 DIAGNOSIS — L89152 Pressure ulcer of sacral region, stage 2: Secondary | ICD-10-CM | POA: Diagnosis not present

## 2023-10-10 DIAGNOSIS — I4891 Unspecified atrial fibrillation: Secondary | ICD-10-CM | POA: Diagnosis not present

## 2023-10-10 DIAGNOSIS — I130 Hypertensive heart and chronic kidney disease with heart failure and stage 1 through stage 4 chronic kidney disease, or unspecified chronic kidney disease: Secondary | ICD-10-CM | POA: Diagnosis not present

## 2023-10-10 DIAGNOSIS — K5900 Constipation, unspecified: Secondary | ICD-10-CM | POA: Diagnosis not present

## 2023-10-10 DIAGNOSIS — E1122 Type 2 diabetes mellitus with diabetic chronic kidney disease: Secondary | ICD-10-CM | POA: Diagnosis not present

## 2023-10-10 DIAGNOSIS — D696 Thrombocytopenia, unspecified: Secondary | ICD-10-CM | POA: Diagnosis not present

## 2023-10-10 DIAGNOSIS — N183 Chronic kidney disease, stage 3 unspecified: Secondary | ICD-10-CM | POA: Diagnosis not present

## 2023-10-10 DIAGNOSIS — I5022 Chronic systolic (congestive) heart failure: Secondary | ICD-10-CM | POA: Diagnosis not present

## 2023-10-11 DIAGNOSIS — I130 Hypertensive heart and chronic kidney disease with heart failure and stage 1 through stage 4 chronic kidney disease, or unspecified chronic kidney disease: Secondary | ICD-10-CM | POA: Diagnosis not present

## 2023-10-11 DIAGNOSIS — I4891 Unspecified atrial fibrillation: Secondary | ICD-10-CM | POA: Diagnosis not present

## 2023-10-11 DIAGNOSIS — D696 Thrombocytopenia, unspecified: Secondary | ICD-10-CM | POA: Diagnosis not present

## 2023-10-11 DIAGNOSIS — I5022 Chronic systolic (congestive) heart failure: Secondary | ICD-10-CM | POA: Diagnosis not present

## 2023-10-11 DIAGNOSIS — E1122 Type 2 diabetes mellitus with diabetic chronic kidney disease: Secondary | ICD-10-CM | POA: Diagnosis not present

## 2023-10-11 DIAGNOSIS — N183 Chronic kidney disease, stage 3 unspecified: Secondary | ICD-10-CM | POA: Diagnosis not present

## 2023-10-11 DIAGNOSIS — L89152 Pressure ulcer of sacral region, stage 2: Secondary | ICD-10-CM | POA: Diagnosis not present

## 2023-10-11 DIAGNOSIS — E876 Hypokalemia: Secondary | ICD-10-CM | POA: Diagnosis not present

## 2023-10-11 DIAGNOSIS — K5900 Constipation, unspecified: Secondary | ICD-10-CM | POA: Diagnosis not present

## 2023-10-12 DIAGNOSIS — L89152 Pressure ulcer of sacral region, stage 2: Secondary | ICD-10-CM | POA: Diagnosis not present

## 2023-10-12 DIAGNOSIS — D696 Thrombocytopenia, unspecified: Secondary | ICD-10-CM | POA: Diagnosis not present

## 2023-10-12 DIAGNOSIS — I130 Hypertensive heart and chronic kidney disease with heart failure and stage 1 through stage 4 chronic kidney disease, or unspecified chronic kidney disease: Secondary | ICD-10-CM | POA: Diagnosis not present

## 2023-10-12 DIAGNOSIS — I4891 Unspecified atrial fibrillation: Secondary | ICD-10-CM | POA: Diagnosis not present

## 2023-10-12 DIAGNOSIS — K5900 Constipation, unspecified: Secondary | ICD-10-CM | POA: Diagnosis not present

## 2023-10-12 DIAGNOSIS — E876 Hypokalemia: Secondary | ICD-10-CM | POA: Diagnosis not present

## 2023-10-12 DIAGNOSIS — N183 Chronic kidney disease, stage 3 unspecified: Secondary | ICD-10-CM | POA: Diagnosis not present

## 2023-10-12 DIAGNOSIS — E1122 Type 2 diabetes mellitus with diabetic chronic kidney disease: Secondary | ICD-10-CM | POA: Diagnosis not present

## 2023-10-12 DIAGNOSIS — I5022 Chronic systolic (congestive) heart failure: Secondary | ICD-10-CM | POA: Diagnosis not present

## 2023-10-14 ENCOUNTER — Ambulatory Visit: Admitting: Physician Assistant

## 2023-10-14 DIAGNOSIS — I130 Hypertensive heart and chronic kidney disease with heart failure and stage 1 through stage 4 chronic kidney disease, or unspecified chronic kidney disease: Secondary | ICD-10-CM | POA: Diagnosis not present

## 2023-10-14 DIAGNOSIS — M109 Gout, unspecified: Secondary | ICD-10-CM | POA: Diagnosis not present

## 2023-10-14 DIAGNOSIS — K5900 Constipation, unspecified: Secondary | ICD-10-CM | POA: Diagnosis not present

## 2023-10-14 DIAGNOSIS — L89152 Pressure ulcer of sacral region, stage 2: Secondary | ICD-10-CM | POA: Diagnosis not present

## 2023-10-14 DIAGNOSIS — D696 Thrombocytopenia, unspecified: Secondary | ICD-10-CM | POA: Diagnosis not present

## 2023-10-14 DIAGNOSIS — I5022 Chronic systolic (congestive) heart failure: Secondary | ICD-10-CM | POA: Diagnosis not present

## 2023-10-14 DIAGNOSIS — I4891 Unspecified atrial fibrillation: Secondary | ICD-10-CM | POA: Diagnosis not present

## 2023-10-14 DIAGNOSIS — E876 Hypokalemia: Secondary | ICD-10-CM | POA: Diagnosis not present

## 2023-10-14 DIAGNOSIS — N183 Chronic kidney disease, stage 3 unspecified: Secondary | ICD-10-CM | POA: Diagnosis not present

## 2023-10-14 DIAGNOSIS — E1122 Type 2 diabetes mellitus with diabetic chronic kidney disease: Secondary | ICD-10-CM | POA: Diagnosis not present

## 2023-10-15 DIAGNOSIS — I5032 Chronic diastolic (congestive) heart failure: Secondary | ICD-10-CM | POA: Diagnosis not present

## 2023-10-15 DIAGNOSIS — I480 Paroxysmal atrial fibrillation: Secondary | ICD-10-CM | POA: Diagnosis not present

## 2023-10-17 ENCOUNTER — Ambulatory Visit: Admitting: Physician Assistant

## 2023-10-20 ENCOUNTER — Telehealth: Payer: Self-pay

## 2023-10-20 NOTE — Telephone Encounter (Signed)
 Left message for patient to give office a call back.

## 2023-10-23 DIAGNOSIS — K529 Noninfective gastroenteritis and colitis, unspecified: Secondary | ICD-10-CM | POA: Diagnosis not present

## 2023-10-23 DIAGNOSIS — Z794 Long term (current) use of insulin: Secondary | ICD-10-CM | POA: Diagnosis not present

## 2023-10-23 DIAGNOSIS — K5909 Other constipation: Secondary | ICD-10-CM | POA: Diagnosis not present

## 2023-10-23 DIAGNOSIS — Z8505 Personal history of malignant neoplasm of liver: Secondary | ICD-10-CM | POA: Diagnosis not present

## 2023-10-23 DIAGNOSIS — Z7901 Long term (current) use of anticoagulants: Secondary | ICD-10-CM | POA: Diagnosis not present

## 2023-10-23 DIAGNOSIS — I4891 Unspecified atrial fibrillation: Secondary | ICD-10-CM | POA: Diagnosis not present

## 2023-10-23 DIAGNOSIS — N1831 Chronic kidney disease, stage 3a: Secondary | ICD-10-CM | POA: Diagnosis not present

## 2023-10-23 DIAGNOSIS — D696 Thrombocytopenia, unspecified: Secondary | ICD-10-CM | POA: Diagnosis not present

## 2023-10-23 DIAGNOSIS — E1122 Type 2 diabetes mellitus with diabetic chronic kidney disease: Secondary | ICD-10-CM | POA: Diagnosis not present

## 2023-10-23 DIAGNOSIS — M109 Gout, unspecified: Secondary | ICD-10-CM | POA: Diagnosis not present

## 2023-10-23 DIAGNOSIS — I5032 Chronic diastolic (congestive) heart failure: Secondary | ICD-10-CM | POA: Diagnosis not present

## 2023-10-23 DIAGNOSIS — E876 Hypokalemia: Secondary | ICD-10-CM | POA: Diagnosis not present

## 2023-10-23 DIAGNOSIS — I130 Hypertensive heart and chronic kidney disease with heart failure and stage 1 through stage 4 chronic kidney disease, or unspecified chronic kidney disease: Secondary | ICD-10-CM | POA: Diagnosis not present

## 2023-10-24 ENCOUNTER — Encounter: Payer: Self-pay | Admitting: Physician Assistant

## 2023-10-24 ENCOUNTER — Telehealth: Payer: Self-pay

## 2023-10-24 ENCOUNTER — Ambulatory Visit (INDEPENDENT_AMBULATORY_CARE_PROVIDER_SITE_OTHER): Admitting: Physician Assistant

## 2023-10-24 VITALS — BP 140/79 | HR 72 | Temp 98.0°F | Resp 16 | Ht 66.0 in | Wt 159.0 lb

## 2023-10-24 DIAGNOSIS — R2689 Other abnormalities of gait and mobility: Secondary | ICD-10-CM | POA: Diagnosis not present

## 2023-10-24 DIAGNOSIS — R531 Weakness: Secondary | ICD-10-CM | POA: Diagnosis not present

## 2023-10-24 DIAGNOSIS — Z794 Long term (current) use of insulin: Secondary | ICD-10-CM

## 2023-10-24 DIAGNOSIS — I480 Paroxysmal atrial fibrillation: Secondary | ICD-10-CM

## 2023-10-24 DIAGNOSIS — K7031 Alcoholic cirrhosis of liver with ascites: Secondary | ICD-10-CM

## 2023-10-24 DIAGNOSIS — E119 Type 2 diabetes mellitus without complications: Secondary | ICD-10-CM | POA: Diagnosis not present

## 2023-10-24 MED ORDER — FUROSEMIDE 20 MG PO TABS: 20.0000 mg | ORAL_TABLET | Freq: Every day | ORAL | 5 refills | Status: DC

## 2023-10-24 NOTE — Telephone Encounter (Signed)
 [facility] called to request PT once a week for 3 weeks and then every other week for 6 weeks. Gave verbal approval.

## 2023-10-24 NOTE — Progress Notes (Signed)
 [facility] [address] [place], [place] [address]  Internal MEDICINE  Office Visit Note  Patient Name: Adriana Miller  [id number]  [id number]  Date of Service: 11/09/2023  Chief Complaint  Patient presents with   Follow-up   Diabetes   Hyperlipidemia   Hypertension   Medication Refill    Furosemide  20mg    Quality Metric Gaps    Eye Exam    HPI Pt is here for routine follow up, her brother is with her -Has home PT/OT weekly -hard to get in and out of bed, requires assistance, and would benefit from hospital bed with adjustment to move positions and get in and out easier  -has cane and walker, but would benefit from wheelchair to use as needed due to ongoing weakness and balance concerns -taking 5units insulin , otherwise no DM meds. BG this morning 113 -aid comes to make breakfast and dinner, there for about 2 hours total -no alcohol  for the last 2 months -still unclear what is going on with her Cirrhosis follow up--advised they call to see about follow up/rescheduling MRI that was canceled last fall -needs eye exam and will call to schedule  Current Medication: Outpatient Encounter Medications as of 10/24/2023  Medication Sig Note   Accu-Chek Softclix Lancets lancets USE AS DIRECTED ONCE DAILY    acetaminophen  (TYLENOL ) 325 MG tablet Take 2 tablets (650 mg total) by mouth every 6 (six) hours as needed for mild pain (or Fever >/= 101). 09/26/2023: prn   Alcohol  Swabs (B-D SINGLE USE SWABS REGULAR) PADS USE AS DIRECTED TWICE DAILY    allopurinol  (ZYLOPRIM ) 100 MG tablet TAKE 1 TABLET BY MOUTH DAILY    apixaban  (ELIQUIS ) 5 MG TABS tablet TAKE 1 TABLET BY MOUTH TWICE  DAILY    ascorbic acid  (VITAMIN C ) 500 MG tablet Take 1 tablet (500 mg total) by mouth daily.    escitalopram  (LEXAPRO ) 10 MG tablet TAKE 1 TABLET BY MOUTH DAILY FOR ANXIETY/SLEEP    ferrous sulfate  325 (65 FE) MG tablet Take 1 tablet (325 mg total) by mouth daily with breakfast.    folic acid   (FOLVITE ) 1 MG tablet Take 1 tablet (1 mg total) by mouth daily.    glucose blood (ACCU-CHEK GUIDE) test strip Use as instructed twice a day DX E11.65    Insulin  Pen Needle (BD PEN NEEDLE NANO 2ND GEN) 32G X 4 MM MISC USE AS DIRECTED WITH TOUJEO     metoprolol  tartrate (LOPRESSOR ) 25 MG tablet Take 1 tablet (25 mg total) by mouth 2 (two) times daily.    mirtazapine  (REMERON ) 7.5 MG tablet TAKE 1 TABLET BY MOUTH DAILY AT  BEDTIME    Multiple Vitamin (MULTIVITAMIN WITH MINERALS) TABS tablet Take 1 tablet by mouth daily.    polyethylene glycol (MIRALAX  / GLYCOLAX ) 17 g packet Take 17 g by mouth daily.    potassium chloride  (KLOR-CON ) 8 MEQ tablet TAKE 1 TABLET BY MOUTH 3 TIMES WEEKLY ON MONDAY, WEDNESDAY, AND FRIDAY    spironolactone  (ALDACTONE ) 25 MG tablet TAKE 1 TABLET BY MOUTH DAILY FOR CIRRHOSIS OF LIVER    thiamine  (VITAMIN B-1) 100 MG tablet Take 1 tablet (100 mg total) by mouth daily.    TOUJEO  SOLOSTAR 300 UNIT/ML Solostar Pen Inject 1-10 Units into the skin at bedtime.    traZODone  (DESYREL ) 50 MG tablet TAKE 1 TO 2 TABLETS BY MOUTH AT  BEDTIME FOR INSOMNIA    [DISCONTINUED] furosemide  (LASIX ) 20 MG tablet TAKE 1 TABLET BY MOUTH EVERY DAY  furosemide  (LASIX ) 20 MG tablet Take 1 tablet (20 mg total) by mouth daily.    No facility-administered encounter medications on file as of 10/24/2023.    Surgical History: Past Surgical History:  Procedure Laterality Date   CARDIOVERSION N/A 12/27/2022   Procedure: CARDIOVERSION;  Surgeon: Darliss Rogue, MD;  Location: [facility] ORS;  Service: Cardiovascular;  Laterality: N/A;   CEREBRAL ANEURYSM REPAIR  1991   COLONOSCOPY WITH PROPOFOL  N/A 02/12/2019   Procedure: COLONOSCOPY WITH PROPOFOL ;  Surgeon: Unk Corinn Skiff, MD;  Location: [facility] ENDOSCOPY;  Service: Gastroenterology;  Laterality: N/A;   ESOPHAGOGASTRODUODENOSCOPY (EGD) WITH PROPOFOL  N/A 02/12/2019   Procedure: ESOPHAGOGASTRODUODENOSCOPY (EGD) WITH PROPOFOL ;  Surgeon: Unk Corinn Skiff,  MD;  Location: [facility] ENDOSCOPY;  Service: Gastroenterology;  Laterality: N/A;   IR ANGIOGRAM SELECTIVE EACH ADDITIONAL VESSEL  09/21/2019   IR ANGIOGRAM SELECTIVE EACH ADDITIONAL VESSEL  09/21/2019   IR ANGIOGRAM SELECTIVE EACH ADDITIONAL VESSEL  09/21/2019   IR ANGIOGRAM SELECTIVE EACH ADDITIONAL VESSEL  09/21/2019   IR ANGIOGRAM SELECTIVE EACH ADDITIONAL VESSEL  10/02/2019   IR ANGIOGRAM VISCERAL SELECTIVE  09/21/2019   IR ANGIOGRAM VISCERAL SELECTIVE  09/21/2019   IR ANGIOGRAM VISCERAL SELECTIVE  10/02/2019   IR EMBO TUMOR ORGAN ISCHEMIA INFARCT INC GUIDE ROADMAPPING  10/02/2019   IR EMBO TUMOR ORGAN ISCHEMIA INFARCT INC GUIDE ROADMAPPING  10/02/2019   IR RADIOLOGIST EVAL & MGMT  08/28/2019   IR RADIOLOGIST EVAL & MGMT  01/30/2020   IR RADIOLOGIST EVAL & MGMT  04/30/2020   IR RADIOLOGIST EVAL & MGMT  07/30/2020   IR RADIOLOGIST EVAL & MGMT  10/28/2020   IR RADIOLOGIST EVAL & MGMT  02/24/2021   IR RADIOLOGIST EVAL & MGMT  06/08/2021   IR RADIOLOGIST EVAL & MGMT  11/24/2021   IR RADIOLOGIST EVAL & MGMT  05/25/2022   IR RADIOLOGIST EVAL & MGMT  02/22/2023   IR US  GUIDE VASC ACCESS LEFT  09/21/2019   IR US  GUIDE VASC ACCESS LEFT  10/02/2019   OPEN REDUCTION INTERNAL FIXATION (ORIF) DISTAL RADIAL FRACTURE Right 08/06/2021   Procedure: OPEN REDUCTION INTERNAL FIXATION (ORIF) DISTAL RADIAL FRACTURE;  Surgeon: Kathlynn Sharper, MD;  Location: [facility] ORS;  Service: Orthopedics;  Laterality: Right;    Medical History: 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)    B12 deficiency 04/05/2017   Blood in stool    Cerebral aneurysm    Chronic kidney disease    Diabetes mellitus without complication (HCC)    type 2   Gastric erosion with bleeding    Hepatocellular carcinoma (HCC)    Hyperlipidemia    Hypertension    Lower extremity cellulitis 02/08/2019   Paroxysmal A-fib (HCC)    Seizure disorder (HCC)     Family History: Family History  Problem Relation Age of Onset    Heart attack Brother 67    Social History   Socioeconomic History   Marital status: Divorced    Spouse name: Not on file   Number of children: Not on file   Years of education: Not on file   Highest education level: Not on file  Occupational History   Not on file  Tobacco Use   Smoking status: Never   Smokeless tobacco: Current    Types: Snuff  Vaping Use   Vaping status: Never Used  Substance and Sexual Activity   Alcohol  use: Yes    Alcohol /week: 5.0 standard drinks of alcohol     Types: 5 Cans of beer  per week   Drug use: No   Sexual activity: Not Currently  Other Topics Concern   Not on file  Social History Narrative   Lives alone   Social Drivers of Health   Financial Resource Strain: Not on file  Food Insecurity: No Food Insecurity (09/27/2023)   Hunger Vital Sign    Worried About Running Out of Food in the Last Year: Never true    Ran Out of Food in the Last Year: Never true  Transportation Needs: No Transportation Needs (09/27/2023)   PRAPARE - Administrator, Civil Service (Medical): No    Lack of Transportation (Non-Medical): No  Physical Activity: Not on file  Stress: Not on file  Social Connections: Moderately Isolated (09/27/2023)   Social Connection and Isolation Panel    Frequency of Communication with Friends and Family: More than three times a week    Frequency of Social Gatherings with Friends and Family: Twice a week    Attends Religious Services: 1 to 4 times per year    Active Member of [employer] or Organizations: No    Attends Banker Meetings: Never    Marital Status: Divorced  Catering manager Violence: Not At Risk (09/27/2023)   Humiliation, Afraid, Rape, and Kick questionnaire    Fear of Current or Ex-Partner: No    Emotionally Abused: No    Physically Abused: No    Sexually Abused: No      Review of Systems  Constitutional:  Negative for chills and unexpected weight change.  HENT:  Negative for congestion,  rhinorrhea, sneezing and sore throat.   Eyes:  Negative for redness.  Respiratory:  Negative for cough, chest tightness and shortness of breath.   Cardiovascular:  Negative for chest pain and palpitations.  Gastrointestinal:  Negative for abdominal pain, constipation, diarrhea, nausea and vomiting.  Genitourinary:  Negative for dysuria and frequency.  Musculoskeletal:  Positive for arthralgias and gait problem. Negative for back pain, joint swelling and neck pain.  Skin:  Negative for rash.  Neurological:  Positive for weakness. Negative for tremors and numbness.  Hematological:  Negative for adenopathy. Does not bruise/bleed easily.  Psychiatric/Behavioral:  Negative for behavioral problems (Depression), sleep disturbance and suicidal ideas. The patient is not nervous/anxious.     Vital Signs: BP (!) 140/79   Pulse 72   Temp 98 F (36.7 C)   Resp 16   Ht 5' 6 (1.676 m)   Wt 159 lb (72.1 kg)   SpO2 95%   BMI 25.66 kg/m    Physical Exam Vitals and nursing note reviewed.  Constitutional:      General: She is not in acute distress.    Appearance: She is well-developed. She is not diaphoretic.  HENT:     Head: Normocephalic and atraumatic.  Neck:     Thyroid : No thyromegaly.     Vascular: No JVD.     Trachea: No tracheal deviation.  Cardiovascular:     Rate and Rhythm: Normal rate and regular rhythm.     Heart sounds: Normal heart sounds.  Pulmonary:     Effort: Pulmonary effort is normal. No respiratory distress.     Breath sounds: No wheezing or rales.  Chest:     Chest wall: No tenderness.  Skin:    General: Skin is warm and dry.  Neurological:     Mental Status: She is alert.     Motor: Weakness present.     Gait: Gait abnormal.  Psychiatric:  Behavior: Behavior normal.        Thought Content: Thought content normal.        Judgment: Judgment normal.        Assessment/Plan: 1. Weakness (Primary) Would benefit from wheelchair and hospital bed due  to weakness and balance concerns - DME Wheelchair manual - For home use only DME Hospital bed  2. Balance problem Would benefit from wheelchair and hospital bed due to weakness and balance concerns - DME Wheelchair manual - For home use only DME Hospital bed  3. Paroxysmal atrial fibrillation (HCC) Followed by cardiology - DME Wheelchair manual - For home use only DME Hospital bed  4. Type 2 diabetes mellitus without complication, with long-term current use of insulin  (HCC) Continue to monitor BG - DME Wheelchair manual - For home use only DME Hospital bed  5. Alcoholic cirrhosis of liver with ascites (HCC) Call for follow up - DME Wheelchair manual - For home use only DME Hospital bed   General Counseling: taysha majewski understanding of the findings of todays visit and agrees with plan of treatment. I have discussed any further diagnostic evaluation that may be needed or ordered today. We also reviewed her medications today. she has been encouraged to call the office with any questions or concerns that should arise related to todays visit.    Orders Placed This Encounter  Procedures   DME Wheelchair manual   For home use only DME Hospital bed    Meds ordered this encounter  Medications   furosemide  (LASIX ) 20 MG tablet    Sig: Take 1 tablet (20 mg total) by mouth daily.    Dispense:  30 tablet    Refill:  5    This patient was seen by Tinnie Pro, PA-C in collaboration with Dr. Sigrid Bathe as a part of collaborative care agreement.   Total time spent:30 Minutes Time spent includes review of chart, medications, test results, and follow up plan with the patient.      Dr Fozia M Khan Internal medicine

## 2023-10-25 DIAGNOSIS — K5909 Other constipation: Secondary | ICD-10-CM | POA: Diagnosis not present

## 2023-10-25 DIAGNOSIS — D696 Thrombocytopenia, unspecified: Secondary | ICD-10-CM | POA: Diagnosis not present

## 2023-10-25 DIAGNOSIS — K529 Noninfective gastroenteritis and colitis, unspecified: Secondary | ICD-10-CM | POA: Diagnosis not present

## 2023-10-25 DIAGNOSIS — Z8505 Personal history of malignant neoplasm of liver: Secondary | ICD-10-CM | POA: Diagnosis not present

## 2023-10-25 DIAGNOSIS — I4891 Unspecified atrial fibrillation: Secondary | ICD-10-CM | POA: Diagnosis not present

## 2023-10-25 DIAGNOSIS — I5032 Chronic diastolic (congestive) heart failure: Secondary | ICD-10-CM | POA: Diagnosis not present

## 2023-10-25 DIAGNOSIS — M109 Gout, unspecified: Secondary | ICD-10-CM | POA: Diagnosis not present

## 2023-10-25 DIAGNOSIS — Z7901 Long term (current) use of anticoagulants: Secondary | ICD-10-CM | POA: Diagnosis not present

## 2023-10-25 DIAGNOSIS — Z794 Long term (current) use of insulin: Secondary | ICD-10-CM | POA: Diagnosis not present

## 2023-10-25 DIAGNOSIS — E876 Hypokalemia: Secondary | ICD-10-CM | POA: Diagnosis not present

## 2023-10-25 DIAGNOSIS — E1122 Type 2 diabetes mellitus with diabetic chronic kidney disease: Secondary | ICD-10-CM | POA: Diagnosis not present

## 2023-10-25 DIAGNOSIS — I130 Hypertensive heart and chronic kidney disease with heart failure and stage 1 through stage 4 chronic kidney disease, or unspecified chronic kidney disease: Secondary | ICD-10-CM | POA: Diagnosis not present

## 2023-10-25 DIAGNOSIS — N1831 Chronic kidney disease, stage 3a: Secondary | ICD-10-CM | POA: Diagnosis not present

## 2023-10-27 ENCOUNTER — Telehealth: Payer: Self-pay

## 2023-10-27 ENCOUNTER — Other Ambulatory Visit: Payer: Self-pay | Admitting: Physician Assistant

## 2023-10-27 NOTE — Telephone Encounter (Signed)
 Sent message to [employer] for wheelchair and hospital bed

## 2023-10-28 ENCOUNTER — Other Ambulatory Visit: Payer: Self-pay

## 2023-10-28 ENCOUNTER — Emergency Department
Admission: EM | Admit: 2023-10-28 | Discharge: 2023-10-29 | Disposition: A | Discharge status: Home / Self Care | Attending: Emergency Medicine | Admitting: Emergency Medicine

## 2023-10-28 ENCOUNTER — Emergency Department

## 2023-10-28 DIAGNOSIS — M5031 Other cervical disc degeneration,  high cervical region: Secondary | ICD-10-CM | POA: Insufficient documentation

## 2023-10-28 DIAGNOSIS — S3992XA Unspecified injury of lower back, initial encounter: Secondary | ICD-10-CM | POA: Diagnosis not present

## 2023-10-28 DIAGNOSIS — M48061 Spinal stenosis, lumbar region without neurogenic claudication: Secondary | ICD-10-CM | POA: Insufficient documentation

## 2023-10-28 DIAGNOSIS — M542 Cervicalgia: Secondary | ICD-10-CM | POA: Insufficient documentation

## 2023-10-28 DIAGNOSIS — Z7901 Long term (current) use of anticoagulants: Secondary | ICD-10-CM | POA: Diagnosis not present

## 2023-10-28 DIAGNOSIS — W01198A Fall on same level from slipping, tripping and stumbling with subsequent striking against other object, initial encounter: Secondary | ICD-10-CM | POA: Diagnosis not present

## 2023-10-28 DIAGNOSIS — I6789 Other cerebrovascular disease: Secondary | ICD-10-CM | POA: Insufficient documentation

## 2023-10-28 DIAGNOSIS — M4804 Spinal stenosis, thoracic region: Secondary | ICD-10-CM | POA: Diagnosis not present

## 2023-10-28 DIAGNOSIS — I5030 Unspecified diastolic (congestive) heart failure: Secondary | ICD-10-CM | POA: Diagnosis not present

## 2023-10-28 DIAGNOSIS — M5134 Other intervertebral disc degeneration, thoracic region: Secondary | ICD-10-CM | POA: Diagnosis not present

## 2023-10-28 DIAGNOSIS — I130 Hypertensive heart and chronic kidney disease with heart failure and stage 1 through stage 4 chronic kidney disease, or unspecified chronic kidney disease: Secondary | ICD-10-CM | POA: Diagnosis not present

## 2023-10-28 DIAGNOSIS — W19XXXA Unspecified fall, initial encounter: Secondary | ICD-10-CM

## 2023-10-28 DIAGNOSIS — E1122 Type 2 diabetes mellitus with diabetic chronic kidney disease: Secondary | ICD-10-CM | POA: Diagnosis not present

## 2023-10-28 DIAGNOSIS — G319 Degenerative disease of nervous system, unspecified: Secondary | ICD-10-CM | POA: Insufficient documentation

## 2023-10-28 DIAGNOSIS — N189 Chronic kidney disease, unspecified: Secondary | ICD-10-CM | POA: Diagnosis not present

## 2023-10-28 DIAGNOSIS — I4891 Unspecified atrial fibrillation: Secondary | ICD-10-CM | POA: Insufficient documentation

## 2023-10-28 DIAGNOSIS — M51369 Other intervertebral disc degeneration, lumbar region without mention of lumbar back pain or lower extremity pain: Secondary | ICD-10-CM | POA: Diagnosis not present

## 2023-10-28 DIAGNOSIS — I959 Hypotension, unspecified: Secondary | ICD-10-CM | POA: Diagnosis not present

## 2023-10-28 DIAGNOSIS — S0990XA Unspecified injury of head, initial encounter: Secondary | ICD-10-CM | POA: Diagnosis not present

## 2023-10-28 DIAGNOSIS — M549 Dorsalgia, unspecified: Secondary | ICD-10-CM | POA: Diagnosis not present

## 2023-10-28 LAB — CBG MONITORING, ED: Glucose-Capillary: 166 mg/dL — ABNORMAL HIGH (ref 70–99)

## 2023-10-28 MED ORDER — OXYCODONE-ACETAMINOPHEN 5-325 MG PO TABS
1.0000 | ORAL_TABLET | Freq: Once | ORAL | Status: AC
  Administered 2023-10-29: 1 via ORAL
  Filled 2023-10-28: qty 1

## 2023-10-28 NOTE — ED Provider Notes (Signed)
 [facility] Provider Note    Event Date/Time   First MD Initiated Contact with Patient 10/28/23 2333     (approximate)   History   Chief Complaint Fall   HPI  Adriana Miller is a 68 y.o. female with past medical history of hypertension, diabetes, atrial fibrillation on Eliquis , diastolic CHF, CKD, alcohol  abuse, and cirrhosis who presents to the ED following fall.  Patient reports that she lost her balance getting into the shower earlier this evening when the bath mat slid out from under her.  She reports falling onto her back and striking her neck, is not sure whether she hit her head but denies losing consciousness.  She now complains of neck pain and pain throughout her entire back, denies abdominal pain, chest pain, or injuries to her extremities.     Physical Exam   Triage Vital Signs: ED Triage Vitals [10/28/23 1753]  Encounter Vitals Group     BP 114/62     Girls Systolic BP Percentile      Girls Diastolic BP Percentile      Boys Systolic BP Percentile      Boys Diastolic BP Percentile      Pulse Rate (!) 57     Resp 18     Temp 98.7 F (37.1 C)     Temp Source Oral     SpO2 96 %     Weight 159 lb (72.1 kg)     Height 5' 6 (1.676 m)     Head Circumference      Peak Flow      Pain Score 4     Pain Loc      Pain Education      Exclude from Growth Chart     Most recent vital signs: Vitals:   10/28/23 1753 10/28/23 2258  BP: 114/62 (!) 145/72  Pulse: (!) 57 (!) 58  Resp: 18 16  Temp: 98.7 F (37.1 C) 98 F (36.7 C)  SpO2: 96% 98%    Constitutional: Alert and oriented. Eyes: Conjunctivae are normal. Head: Atraumatic. Nose: No congestion/rhinnorhea. Mouth/Throat: Mucous membranes are moist.  Neck: Midline cervical spine tenderness to palpation noted. Cardiovascular: Normal rate, regular rhythm. Grossly normal heart sounds.  2+ radial pulses bilaterally. Respiratory: Normal respiratory effort.  No retractions. Lungs CTAB.   No chest wall tenderness to palpation. Gastrointestinal: Soft and nontender. No distention. Musculoskeletal: No lower extremity tenderness nor edema.  No upper extremity bony tenderness. Neurologic:  Normal speech and language. No gross focal neurologic deficits are appreciated.    ED Results / Procedures / Treatments   Labs (all labs ordered are listed, but only abnormal results are displayed) Labs Reviewed  CBG MONITORING, ED - Abnormal; Notable for the following components:      Result Value   Glucose-Capillary 166 (*)    All other components within normal limits    RADIOLOGY CT head reviewed and interpreted by me with no hemorrhage or midline shift.  PROCEDURES:  Critical Care performed: No  Procedures   MEDICATIONS ORDERED IN ED: Medications  oxyCODONE -acetaminophen  (PERCOCET/ROXICET) 5-325 MG per tablet 1 tablet (1 tablet Oral Given 10/29/23 0033)     IMPRESSION / MDM / ASSESSMENT AND PLAN / ED COURSE  I reviewed the triage vital signs and the nursing notes.                              68 y.o.  female with past medical history of hypertension, diabetes, atrial fibrillation on Eliquis , diastolic CHF, CKD, alcohol  abuse, and cirrhosis who presents to the ED complaining of fall attempting to get into the shower, striking her neck and back.  Patient's presentation is most consistent with acute complicated illness / injury requiring diagnostic workup.  Differential diagnosis includes, but is not limited to, intracranial injury, cervical spine injury, thoracic spinal injury, lumbar spinal injury.  Patient nontoxic-appearing and in no acute distress, vital signs are unremarkable.  CT head and cervical spine performed from triage and are negative for acute process, will check CT imaging of her thoracic and lumbar spine.  No evidence of traumatic injury to her chest, abdomen, or extremities.  Plan to treat pain with Percocet and reassess following additional imaging.  CT  imaging of the thoracic and lumbar spine is negative for acute traumatic injury, other chronic findings noted but patient appropriate for discharge home with outpatient PCP follow-up.  She was ambulatory here in the ED without difficulty.  We will prescribe Lidoderm  patches and she was counseled to follow-up with her PCP, otherwise return to the ED for new or worsening symptoms.  Patient agrees with plan.      FINAL CLINICAL IMPRESSION(S) / ED DIAGNOSES   Final diagnoses:  Fall, initial encounter  Injury of back, initial encounter     Rx / DC Orders   ED Discharge Orders          Ordered    lidocaine  (LIDODERM ) 5 %  Every 12 hours        10/29/23 0140             Note:  This document was prepared using Dragon voice recognition software and may include unintentional dictation errors.   Willo Dunnings, MD 10/29/23 [phone]

## 2023-10-28 NOTE — ED Triage Notes (Addendum)
 Pt to ED via [facility] from home for fall. Pt had a mechanical fall getting into shower after bath matt slid out from under her. Pt reports upper back and neck pain. Denies LOC or head trauma. Pt reports is on blood thinner. EMS reports was unable to get CBG.   Pt reports had just taken 5 units of insulin  at 5pm without food prior to getting into shower. CBG on arrival 166. Pt A&Ox4.

## 2023-10-29 ENCOUNTER — Emergency Department

## 2023-10-29 DIAGNOSIS — M549 Dorsalgia, unspecified: Secondary | ICD-10-CM | POA: Diagnosis not present

## 2023-10-29 DIAGNOSIS — S3992XA Unspecified injury of lower back, initial encounter: Secondary | ICD-10-CM | POA: Diagnosis not present

## 2023-10-29 DIAGNOSIS — M51369 Other intervertebral disc degeneration, lumbar region without mention of lumbar back pain or lower extremity pain: Secondary | ICD-10-CM | POA: Diagnosis not present

## 2023-10-29 DIAGNOSIS — M4804 Spinal stenosis, thoracic region: Secondary | ICD-10-CM | POA: Diagnosis not present

## 2023-10-29 DIAGNOSIS — M5134 Other intervertebral disc degeneration, thoracic region: Secondary | ICD-10-CM | POA: Diagnosis not present

## 2023-10-29 MED ORDER — LIDOCAINE 5 % EX PTCH: 1.0000 | MEDICATED_PATCH | Freq: Two times a day (BID) | CUTANEOUS | 0 refills | Status: AC

## 2023-10-31 DIAGNOSIS — R531 Weakness: Secondary | ICD-10-CM | POA: Diagnosis not present

## 2023-11-02 DIAGNOSIS — E1122 Type 2 diabetes mellitus with diabetic chronic kidney disease: Secondary | ICD-10-CM | POA: Diagnosis not present

## 2023-11-02 DIAGNOSIS — E876 Hypokalemia: Secondary | ICD-10-CM | POA: Diagnosis not present

## 2023-11-02 DIAGNOSIS — Z7901 Long term (current) use of anticoagulants: Secondary | ICD-10-CM | POA: Diagnosis not present

## 2023-11-02 DIAGNOSIS — K5909 Other constipation: Secondary | ICD-10-CM | POA: Diagnosis not present

## 2023-11-02 DIAGNOSIS — I130 Hypertensive heart and chronic kidney disease with heart failure and stage 1 through stage 4 chronic kidney disease, or unspecified chronic kidney disease: Secondary | ICD-10-CM | POA: Diagnosis not present

## 2023-11-02 DIAGNOSIS — K529 Noninfective gastroenteritis and colitis, unspecified: Secondary | ICD-10-CM | POA: Diagnosis not present

## 2023-11-02 DIAGNOSIS — I5032 Chronic diastolic (congestive) heart failure: Secondary | ICD-10-CM | POA: Diagnosis not present

## 2023-11-02 DIAGNOSIS — Z8505 Personal history of malignant neoplasm of liver: Secondary | ICD-10-CM | POA: Diagnosis not present

## 2023-11-02 DIAGNOSIS — N1831 Chronic kidney disease, stage 3a: Secondary | ICD-10-CM | POA: Diagnosis not present

## 2023-11-02 DIAGNOSIS — Z794 Long term (current) use of insulin: Secondary | ICD-10-CM | POA: Diagnosis not present

## 2023-11-02 DIAGNOSIS — D696 Thrombocytopenia, unspecified: Secondary | ICD-10-CM | POA: Diagnosis not present

## 2023-11-02 DIAGNOSIS — M109 Gout, unspecified: Secondary | ICD-10-CM | POA: Diagnosis not present

## 2023-11-02 DIAGNOSIS — I4891 Unspecified atrial fibrillation: Secondary | ICD-10-CM | POA: Diagnosis not present

## 2023-11-04 DIAGNOSIS — Z7901 Long term (current) use of anticoagulants: Secondary | ICD-10-CM | POA: Diagnosis not present

## 2023-11-04 DIAGNOSIS — N1831 Chronic kidney disease, stage 3a: Secondary | ICD-10-CM | POA: Diagnosis not present

## 2023-11-04 DIAGNOSIS — I4891 Unspecified atrial fibrillation: Secondary | ICD-10-CM | POA: Diagnosis not present

## 2023-11-04 DIAGNOSIS — E1122 Type 2 diabetes mellitus with diabetic chronic kidney disease: Secondary | ICD-10-CM | POA: Diagnosis not present

## 2023-11-04 DIAGNOSIS — K529 Noninfective gastroenteritis and colitis, unspecified: Secondary | ICD-10-CM | POA: Diagnosis not present

## 2023-11-04 DIAGNOSIS — M109 Gout, unspecified: Secondary | ICD-10-CM | POA: Diagnosis not present

## 2023-11-04 DIAGNOSIS — I5032 Chronic diastolic (congestive) heart failure: Secondary | ICD-10-CM | POA: Diagnosis not present

## 2023-11-04 DIAGNOSIS — Z794 Long term (current) use of insulin: Secondary | ICD-10-CM | POA: Diagnosis not present

## 2023-11-04 DIAGNOSIS — D696 Thrombocytopenia, unspecified: Secondary | ICD-10-CM | POA: Diagnosis not present

## 2023-11-04 DIAGNOSIS — E876 Hypokalemia: Secondary | ICD-10-CM | POA: Diagnosis not present

## 2023-11-04 DIAGNOSIS — Z8505 Personal history of malignant neoplasm of liver: Secondary | ICD-10-CM | POA: Diagnosis not present

## 2023-11-04 DIAGNOSIS — I130 Hypertensive heart and chronic kidney disease with heart failure and stage 1 through stage 4 chronic kidney disease, or unspecified chronic kidney disease: Secondary | ICD-10-CM | POA: Diagnosis not present

## 2023-11-04 DIAGNOSIS — K5909 Other constipation: Secondary | ICD-10-CM | POA: Diagnosis not present

## 2023-11-09 DIAGNOSIS — K529 Noninfective gastroenteritis and colitis, unspecified: Secondary | ICD-10-CM | POA: Diagnosis not present

## 2023-11-09 DIAGNOSIS — Z8505 Personal history of malignant neoplasm of liver: Secondary | ICD-10-CM | POA: Diagnosis not present

## 2023-11-09 DIAGNOSIS — I4891 Unspecified atrial fibrillation: Secondary | ICD-10-CM | POA: Diagnosis not present

## 2023-11-09 DIAGNOSIS — I5032 Chronic diastolic (congestive) heart failure: Secondary | ICD-10-CM | POA: Diagnosis not present

## 2023-11-09 DIAGNOSIS — E1122 Type 2 diabetes mellitus with diabetic chronic kidney disease: Secondary | ICD-10-CM | POA: Diagnosis not present

## 2023-11-09 DIAGNOSIS — Z7901 Long term (current) use of anticoagulants: Secondary | ICD-10-CM | POA: Diagnosis not present

## 2023-11-09 DIAGNOSIS — K5909 Other constipation: Secondary | ICD-10-CM | POA: Diagnosis not present

## 2023-11-09 DIAGNOSIS — E876 Hypokalemia: Secondary | ICD-10-CM | POA: Diagnosis not present

## 2023-11-09 DIAGNOSIS — N1831 Chronic kidney disease, stage 3a: Secondary | ICD-10-CM | POA: Diagnosis not present

## 2023-11-09 DIAGNOSIS — M109 Gout, unspecified: Secondary | ICD-10-CM | POA: Diagnosis not present

## 2023-11-09 DIAGNOSIS — D696 Thrombocytopenia, unspecified: Secondary | ICD-10-CM | POA: Diagnosis not present

## 2023-11-09 DIAGNOSIS — Z794 Long term (current) use of insulin: Secondary | ICD-10-CM | POA: Diagnosis not present

## 2023-11-09 DIAGNOSIS — I130 Hypertensive heart and chronic kidney disease with heart failure and stage 1 through stage 4 chronic kidney disease, or unspecified chronic kidney disease: Secondary | ICD-10-CM | POA: Diagnosis not present

## 2023-11-15 ENCOUNTER — Telehealth: Payer: Self-pay

## 2023-11-15 NOTE — Telephone Encounter (Signed)
 Spoke with [employer] that pt brother refused for manual wheelchair they will go head process order and deliver and they already deliver hospital bed

## 2023-11-16 DIAGNOSIS — I130 Hypertensive heart and chronic kidney disease with heart failure and stage 1 through stage 4 chronic kidney disease, or unspecified chronic kidney disease: Secondary | ICD-10-CM | POA: Diagnosis not present

## 2023-11-16 DIAGNOSIS — D696 Thrombocytopenia, unspecified: Secondary | ICD-10-CM | POA: Diagnosis not present

## 2023-11-16 DIAGNOSIS — Z8505 Personal history of malignant neoplasm of liver: Secondary | ICD-10-CM | POA: Diagnosis not present

## 2023-11-16 DIAGNOSIS — E1122 Type 2 diabetes mellitus with diabetic chronic kidney disease: Secondary | ICD-10-CM | POA: Diagnosis not present

## 2023-11-16 DIAGNOSIS — K529 Noninfective gastroenteritis and colitis, unspecified: Secondary | ICD-10-CM | POA: Diagnosis not present

## 2023-11-16 DIAGNOSIS — Z7901 Long term (current) use of anticoagulants: Secondary | ICD-10-CM | POA: Diagnosis not present

## 2023-11-16 DIAGNOSIS — K5909 Other constipation: Secondary | ICD-10-CM | POA: Diagnosis not present

## 2023-11-16 DIAGNOSIS — N1831 Chronic kidney disease, stage 3a: Secondary | ICD-10-CM | POA: Diagnosis not present

## 2023-11-16 DIAGNOSIS — I4891 Unspecified atrial fibrillation: Secondary | ICD-10-CM | POA: Diagnosis not present

## 2023-11-16 DIAGNOSIS — I5032 Chronic diastolic (congestive) heart failure: Secondary | ICD-10-CM | POA: Diagnosis not present

## 2023-11-16 DIAGNOSIS — M109 Gout, unspecified: Secondary | ICD-10-CM | POA: Diagnosis not present

## 2023-11-16 DIAGNOSIS — E876 Hypokalemia: Secondary | ICD-10-CM | POA: Diagnosis not present

## 2023-11-16 DIAGNOSIS — Z794 Long term (current) use of insulin: Secondary | ICD-10-CM | POA: Diagnosis not present

## 2023-11-22 DIAGNOSIS — D696 Thrombocytopenia, unspecified: Secondary | ICD-10-CM | POA: Diagnosis not present

## 2023-11-22 DIAGNOSIS — N1831 Chronic kidney disease, stage 3a: Secondary | ICD-10-CM | POA: Diagnosis not present

## 2023-11-22 DIAGNOSIS — M109 Gout, unspecified: Secondary | ICD-10-CM | POA: Diagnosis not present

## 2023-11-22 DIAGNOSIS — K529 Noninfective gastroenteritis and colitis, unspecified: Secondary | ICD-10-CM | POA: Diagnosis not present

## 2023-11-22 DIAGNOSIS — Z8505 Personal history of malignant neoplasm of liver: Secondary | ICD-10-CM | POA: Diagnosis not present

## 2023-11-22 DIAGNOSIS — K5909 Other constipation: Secondary | ICD-10-CM | POA: Diagnosis not present

## 2023-11-22 DIAGNOSIS — Z7901 Long term (current) use of anticoagulants: Secondary | ICD-10-CM | POA: Diagnosis not present

## 2023-11-22 DIAGNOSIS — I4891 Unspecified atrial fibrillation: Secondary | ICD-10-CM | POA: Diagnosis not present

## 2023-11-22 DIAGNOSIS — I130 Hypertensive heart and chronic kidney disease with heart failure and stage 1 through stage 4 chronic kidney disease, or unspecified chronic kidney disease: Secondary | ICD-10-CM | POA: Diagnosis not present

## 2023-11-22 DIAGNOSIS — Z794 Long term (current) use of insulin: Secondary | ICD-10-CM | POA: Diagnosis not present

## 2023-11-22 DIAGNOSIS — E1122 Type 2 diabetes mellitus with diabetic chronic kidney disease: Secondary | ICD-10-CM | POA: Diagnosis not present

## 2023-11-22 DIAGNOSIS — I5032 Chronic diastolic (congestive) heart failure: Secondary | ICD-10-CM | POA: Diagnosis not present

## 2023-11-22 DIAGNOSIS — E876 Hypokalemia: Secondary | ICD-10-CM | POA: Diagnosis not present

## 2023-11-24 DIAGNOSIS — Z794 Long term (current) use of insulin: Secondary | ICD-10-CM | POA: Diagnosis not present

## 2023-11-24 DIAGNOSIS — M109 Gout, unspecified: Secondary | ICD-10-CM | POA: Diagnosis not present

## 2023-11-24 DIAGNOSIS — Z7901 Long term (current) use of anticoagulants: Secondary | ICD-10-CM | POA: Diagnosis not present

## 2023-11-24 DIAGNOSIS — K5909 Other constipation: Secondary | ICD-10-CM | POA: Diagnosis not present

## 2023-11-24 DIAGNOSIS — K529 Noninfective gastroenteritis and colitis, unspecified: Secondary | ICD-10-CM | POA: Diagnosis not present

## 2023-11-24 DIAGNOSIS — I130 Hypertensive heart and chronic kidney disease with heart failure and stage 1 through stage 4 chronic kidney disease, or unspecified chronic kidney disease: Secondary | ICD-10-CM | POA: Diagnosis not present

## 2023-11-24 DIAGNOSIS — Z8505 Personal history of malignant neoplasm of liver: Secondary | ICD-10-CM | POA: Diagnosis not present

## 2023-11-24 DIAGNOSIS — D696 Thrombocytopenia, unspecified: Secondary | ICD-10-CM | POA: Diagnosis not present

## 2023-11-24 DIAGNOSIS — I5032 Chronic diastolic (congestive) heart failure: Secondary | ICD-10-CM | POA: Diagnosis not present

## 2023-11-24 DIAGNOSIS — N1831 Chronic kidney disease, stage 3a: Secondary | ICD-10-CM | POA: Diagnosis not present

## 2023-11-24 DIAGNOSIS — E876 Hypokalemia: Secondary | ICD-10-CM | POA: Diagnosis not present

## 2023-11-24 DIAGNOSIS — I4891 Unspecified atrial fibrillation: Secondary | ICD-10-CM | POA: Diagnosis not present

## 2023-11-24 DIAGNOSIS — E1122 Type 2 diabetes mellitus with diabetic chronic kidney disease: Secondary | ICD-10-CM | POA: Diagnosis not present

## 2023-11-28 DIAGNOSIS — I130 Hypertensive heart and chronic kidney disease with heart failure and stage 1 through stage 4 chronic kidney disease, or unspecified chronic kidney disease: Secondary | ICD-10-CM | POA: Diagnosis not present

## 2023-11-28 DIAGNOSIS — K5909 Other constipation: Secondary | ICD-10-CM | POA: Diagnosis not present

## 2023-11-28 DIAGNOSIS — D696 Thrombocytopenia, unspecified: Secondary | ICD-10-CM | POA: Diagnosis not present

## 2023-11-28 DIAGNOSIS — E1122 Type 2 diabetes mellitus with diabetic chronic kidney disease: Secondary | ICD-10-CM | POA: Diagnosis not present

## 2023-11-28 DIAGNOSIS — I4891 Unspecified atrial fibrillation: Secondary | ICD-10-CM | POA: Diagnosis not present

## 2023-11-28 DIAGNOSIS — K529 Noninfective gastroenteritis and colitis, unspecified: Secondary | ICD-10-CM | POA: Diagnosis not present

## 2023-11-28 DIAGNOSIS — Z8505 Personal history of malignant neoplasm of liver: Secondary | ICD-10-CM | POA: Diagnosis not present

## 2023-11-28 DIAGNOSIS — Z7901 Long term (current) use of anticoagulants: Secondary | ICD-10-CM | POA: Diagnosis not present

## 2023-11-28 DIAGNOSIS — M109 Gout, unspecified: Secondary | ICD-10-CM | POA: Diagnosis not present

## 2023-11-28 DIAGNOSIS — Z794 Long term (current) use of insulin: Secondary | ICD-10-CM | POA: Diagnosis not present

## 2023-11-28 DIAGNOSIS — E876 Hypokalemia: Secondary | ICD-10-CM | POA: Diagnosis not present

## 2023-11-28 DIAGNOSIS — N1831 Chronic kidney disease, stage 3a: Secondary | ICD-10-CM | POA: Diagnosis not present

## 2023-11-28 DIAGNOSIS — I5032 Chronic diastolic (congestive) heart failure: Secondary | ICD-10-CM | POA: Diagnosis not present

## 2023-12-01 ENCOUNTER — Telehealth: Payer: Self-pay

## 2023-12-01 NOTE — Telephone Encounter (Signed)
 Lmom pt brother that I spoke with [facility]  for wheelchair they will him and deliver asap

## 2023-12-02 DIAGNOSIS — R531 Weakness: Secondary | ICD-10-CM | POA: Diagnosis not present

## 2023-12-06 ENCOUNTER — Other Ambulatory Visit: Payer: Self-pay | Admitting: Physician Assistant

## 2023-12-06 ENCOUNTER — Ambulatory Visit: Admitting: Internal Medicine

## 2023-12-06 DIAGNOSIS — K529 Noninfective gastroenteritis and colitis, unspecified: Secondary | ICD-10-CM | POA: Diagnosis not present

## 2023-12-06 DIAGNOSIS — E876 Hypokalemia: Secondary | ICD-10-CM | POA: Diagnosis not present

## 2023-12-06 DIAGNOSIS — E1122 Type 2 diabetes mellitus with diabetic chronic kidney disease: Secondary | ICD-10-CM | POA: Diagnosis not present

## 2023-12-06 DIAGNOSIS — M109 Gout, unspecified: Secondary | ICD-10-CM | POA: Diagnosis not present

## 2023-12-06 DIAGNOSIS — K5909 Other constipation: Secondary | ICD-10-CM | POA: Diagnosis not present

## 2023-12-06 DIAGNOSIS — I130 Hypertensive heart and chronic kidney disease with heart failure and stage 1 through stage 4 chronic kidney disease, or unspecified chronic kidney disease: Secondary | ICD-10-CM | POA: Diagnosis not present

## 2023-12-06 DIAGNOSIS — I5032 Chronic diastolic (congestive) heart failure: Secondary | ICD-10-CM | POA: Diagnosis not present

## 2023-12-06 DIAGNOSIS — Z8505 Personal history of malignant neoplasm of liver: Secondary | ICD-10-CM | POA: Diagnosis not present

## 2023-12-06 DIAGNOSIS — I4891 Unspecified atrial fibrillation: Secondary | ICD-10-CM | POA: Diagnosis not present

## 2023-12-06 DIAGNOSIS — N1831 Chronic kidney disease, stage 3a: Secondary | ICD-10-CM | POA: Diagnosis not present

## 2023-12-06 DIAGNOSIS — D696 Thrombocytopenia, unspecified: Secondary | ICD-10-CM | POA: Diagnosis not present

## 2023-12-06 DIAGNOSIS — Z7901 Long term (current) use of anticoagulants: Secondary | ICD-10-CM | POA: Diagnosis not present

## 2023-12-06 DIAGNOSIS — G2581 Restless legs syndrome: Secondary | ICD-10-CM

## 2023-12-06 DIAGNOSIS — Z794 Long term (current) use of insulin: Secondary | ICD-10-CM | POA: Diagnosis not present

## 2023-12-07 DIAGNOSIS — E876 Hypokalemia: Secondary | ICD-10-CM | POA: Diagnosis not present

## 2023-12-07 DIAGNOSIS — N1831 Chronic kidney disease, stage 3a: Secondary | ICD-10-CM | POA: Diagnosis not present

## 2023-12-07 DIAGNOSIS — K529 Noninfective gastroenteritis and colitis, unspecified: Secondary | ICD-10-CM | POA: Diagnosis not present

## 2023-12-07 DIAGNOSIS — I130 Hypertensive heart and chronic kidney disease with heart failure and stage 1 through stage 4 chronic kidney disease, or unspecified chronic kidney disease: Secondary | ICD-10-CM | POA: Diagnosis not present

## 2023-12-07 DIAGNOSIS — K5909 Other constipation: Secondary | ICD-10-CM | POA: Diagnosis not present

## 2023-12-07 DIAGNOSIS — M109 Gout, unspecified: Secondary | ICD-10-CM | POA: Diagnosis not present

## 2023-12-07 DIAGNOSIS — I4891 Unspecified atrial fibrillation: Secondary | ICD-10-CM | POA: Diagnosis not present

## 2023-12-07 DIAGNOSIS — I5032 Chronic diastolic (congestive) heart failure: Secondary | ICD-10-CM | POA: Diagnosis not present

## 2023-12-07 DIAGNOSIS — D696 Thrombocytopenia, unspecified: Secondary | ICD-10-CM | POA: Diagnosis not present

## 2023-12-07 DIAGNOSIS — Z794 Long term (current) use of insulin: Secondary | ICD-10-CM | POA: Diagnosis not present

## 2023-12-07 DIAGNOSIS — Z8505 Personal history of malignant neoplasm of liver: Secondary | ICD-10-CM | POA: Diagnosis not present

## 2023-12-07 DIAGNOSIS — Z7901 Long term (current) use of anticoagulants: Secondary | ICD-10-CM | POA: Diagnosis not present

## 2023-12-07 DIAGNOSIS — E1122 Type 2 diabetes mellitus with diabetic chronic kidney disease: Secondary | ICD-10-CM | POA: Diagnosis not present

## 2023-12-13 ENCOUNTER — Encounter: Payer: Self-pay | Admitting: Internal Medicine

## 2023-12-13 ENCOUNTER — Ambulatory Visit (INDEPENDENT_AMBULATORY_CARE_PROVIDER_SITE_OTHER): Admitting: Internal Medicine

## 2023-12-13 VITALS — BP 121/70 | HR 64 | Temp 98.0°F | Resp 16 | Ht 65.0 in | Wt 163.0 lb

## 2023-12-13 DIAGNOSIS — I4891 Unspecified atrial fibrillation: Secondary | ICD-10-CM | POA: Diagnosis not present

## 2023-12-13 DIAGNOSIS — G720 Drug-induced myopathy: Secondary | ICD-10-CM

## 2023-12-13 DIAGNOSIS — Z794 Long term (current) use of insulin: Secondary | ICD-10-CM | POA: Diagnosis not present

## 2023-12-13 DIAGNOSIS — C220 Liver cell carcinoma: Secondary | ICD-10-CM | POA: Diagnosis not present

## 2023-12-13 DIAGNOSIS — M109 Gout, unspecified: Secondary | ICD-10-CM | POA: Diagnosis not present

## 2023-12-13 DIAGNOSIS — K529 Noninfective gastroenteritis and colitis, unspecified: Secondary | ICD-10-CM | POA: Diagnosis not present

## 2023-12-13 DIAGNOSIS — Z7901 Long term (current) use of anticoagulants: Secondary | ICD-10-CM | POA: Diagnosis not present

## 2023-12-13 DIAGNOSIS — I5032 Chronic diastolic (congestive) heart failure: Secondary | ICD-10-CM | POA: Diagnosis not present

## 2023-12-13 DIAGNOSIS — T466X5A Adverse effect of antihyperlipidemic and antiarteriosclerotic drugs, initial encounter: Secondary | ICD-10-CM

## 2023-12-13 DIAGNOSIS — E1122 Type 2 diabetes mellitus with diabetic chronic kidney disease: Secondary | ICD-10-CM | POA: Diagnosis not present

## 2023-12-13 DIAGNOSIS — I700 Atherosclerosis of aorta: Secondary | ICD-10-CM | POA: Diagnosis not present

## 2023-12-13 DIAGNOSIS — E876 Hypokalemia: Secondary | ICD-10-CM | POA: Diagnosis not present

## 2023-12-13 DIAGNOSIS — I480 Paroxysmal atrial fibrillation: Secondary | ICD-10-CM

## 2023-12-13 DIAGNOSIS — E1165 Type 2 diabetes mellitus with hyperglycemia: Secondary | ICD-10-CM | POA: Diagnosis not present

## 2023-12-13 DIAGNOSIS — K5909 Other constipation: Secondary | ICD-10-CM | POA: Diagnosis not present

## 2023-12-13 DIAGNOSIS — Z8505 Personal history of malignant neoplasm of liver: Secondary | ICD-10-CM | POA: Diagnosis not present

## 2023-12-13 DIAGNOSIS — I130 Hypertensive heart and chronic kidney disease with heart failure and stage 1 through stage 4 chronic kidney disease, or unspecified chronic kidney disease: Secondary | ICD-10-CM | POA: Diagnosis not present

## 2023-12-13 DIAGNOSIS — D696 Thrombocytopenia, unspecified: Secondary | ICD-10-CM | POA: Diagnosis not present

## 2023-12-13 DIAGNOSIS — N1831 Chronic kidney disease, stage 3a: Secondary | ICD-10-CM | POA: Diagnosis not present

## 2023-12-13 NOTE — Progress Notes (Signed)
 [facility] [address] [place], [place] [address]  Internal MEDICINE  Office Visit Note  Patient Name: Adriana Miller  [id number]  [id number]  Date of Service: 01/16/2024  Chief Complaint  Patient presents with   Follow-up   Hypertension   Diabetes    HPI Pt is seen for routine follow up She has Type 2 insulin  dependent DM, last hg A1c 4.9. she only use 2-5 units of Toujeo   Has home health for aid and assist Patient is history of cirrhosis of the liver and has hepatocellular carcinoma, however patient has been lost to follow-up with oncology, she was also on hospice for a while She denies any complaints does not wish to see multiple specialist Recent cardioversion for atrial fibrillation patient is on Eliquis     Current Medication: Outpatient Encounter Medications as of 12/13/2023  Medication Sig Note   Accu-Chek Softclix Lancets lancets USE AS DIRECTED ONCE DAILY    acetaminophen  (TYLENOL ) 325 MG tablet Take 2 tablets (650 mg total) by mouth every 6 (six) hours as needed for mild pain (or Fever >/= 101). 09/26/2023: prn   Alcohol  Swabs (B-D SINGLE USE SWABS REGULAR) PADS USE AS DIRECTED TWICE DAILY    ascorbic acid  (VITAMIN C ) 500 MG tablet Take 1 tablet (500 mg total) by mouth daily.    Continuous Glucose Sensor (DEXCOM G7 SENSOR) MISC USE AS DIRECTED FOR CONTINUOUS  GLUCOSE MONITORING . CHANGE  SENSOR EVERY 10 DAYS    ferrous sulfate  325 (65 FE) MG tablet Take 1 tablet (325 mg total) by mouth daily with breakfast.    folic acid  (FOLVITE ) 1 MG tablet Take 1 tablet (1 mg total) by mouth daily.    furosemide  (LASIX ) 20 MG tablet Take 1 tablet (20 mg total) by mouth daily.    glucose blood (ACCU-CHEK GUIDE) test strip Use as instructed twice a day DX E11.65    lidocaine  (LIDODERM ) 5 % Place 1 patch onto the skin every 12 (twelve) hours. Remove & Discard patch within 12 hours or as directed by MD    metoprolol  tartrate (LOPRESSOR ) 25 MG tablet Take 1 tablet (25 mg  total) by mouth 2 (two) times daily.    mirtazapine  (REMERON ) 7.5 MG tablet TAKE 1 TABLET BY MOUTH DAILY AT BEDTIME    Multiple Vitamin (MULTIVITAMIN WITH MINERALS) TABS tablet Take 1 tablet by mouth daily.    polyethylene glycol (MIRALAX  / GLYCOLAX ) 17 g packet Take 17 g by mouth daily.    potassium chloride  (KLOR-CON ) 8 MEQ tablet TAKE 1 TABLET BY MOUTH 3 TIMES WEEKLY ON MONDAY, WEDNESDAY, AND FRIDAY    thiamine  (VITAMIN B-1) 100 MG tablet Take 1 tablet (100 mg total) by mouth daily.    [DISCONTINUED] allopurinol  (ZYLOPRIM ) 100 MG tablet TAKE 1 TABLET BY MOUTH DAILY    [DISCONTINUED] apixaban  (ELIQUIS ) 5 MG TABS tablet TAKE 1 TABLET BY MOUTH TWICE  DAILY    [DISCONTINUED] escitalopram  (LEXAPRO ) 10 MG tablet TAKE 1 TABLET BY MOUTH DAILY FOR ANXIETY/SLEEP    [DISCONTINUED] Insulin  Pen Needle (BD PEN NEEDLE NANO 2ND GEN) 32G X 4 MM MISC USE AS DIRECTED WITH TOUJEO     [DISCONTINUED] spironolactone  (ALDACTONE ) 25 MG tablet TAKE 1 TABLET BY MOUTH DAILY FOR CIRRHOSIS OF LIVER    [DISCONTINUED] TOUJEO  SOLOSTAR 300 UNIT/ML Solostar Pen Inject 1-10 Units into the skin at bedtime.    [DISCONTINUED] traZODone  (DESYREL ) 50 MG tablet TAKE 1 TO 2 TABLETS BY MOUTH AT  BEDTIME FOR INSOMNIA    No facility-administered encounter  medications on file as of 12/13/2023.    Surgical History: Past Surgical History:  Procedure Laterality Date   CARDIOVERSION N/A 12/27/2022   Procedure: CARDIOVERSION;  Surgeon: Darliss Rogue, MD;  Location: [facility] ORS;  Service: Cardiovascular;  Laterality: N/A;   CEREBRAL ANEURYSM REPAIR  1991   COLONOSCOPY WITH PROPOFOL  N/A 02/12/2019   Procedure: COLONOSCOPY WITH PROPOFOL ;  Surgeon: Unk Corinn Skiff, MD;  Location: [facility] ENDOSCOPY;  Service: Gastroenterology;  Laterality: N/A;   ESOPHAGOGASTRODUODENOSCOPY (EGD) WITH PROPOFOL  N/A 02/12/2019   Procedure: ESOPHAGOGASTRODUODENOSCOPY (EGD) WITH PROPOFOL ;  Surgeon: Unk Corinn Skiff, MD;  Location: [facility] ENDOSCOPY;  Service:  Gastroenterology;  Laterality: N/A;   IR ANGIOGRAM SELECTIVE EACH ADDITIONAL VESSEL  09/21/2019   IR ANGIOGRAM SELECTIVE EACH ADDITIONAL VESSEL  09/21/2019   IR ANGIOGRAM SELECTIVE EACH ADDITIONAL VESSEL  09/21/2019   IR ANGIOGRAM SELECTIVE EACH ADDITIONAL VESSEL  09/21/2019   IR ANGIOGRAM SELECTIVE EACH ADDITIONAL VESSEL  10/02/2019   IR ANGIOGRAM VISCERAL SELECTIVE  09/21/2019   IR ANGIOGRAM VISCERAL SELECTIVE  09/21/2019   IR ANGIOGRAM VISCERAL SELECTIVE  10/02/2019   IR EMBO TUMOR ORGAN ISCHEMIA INFARCT INC GUIDE ROADMAPPING  10/02/2019   IR EMBO TUMOR ORGAN ISCHEMIA INFARCT INC GUIDE ROADMAPPING  10/02/2019   IR RADIOLOGIST EVAL & MGMT  08/28/2019   IR RADIOLOGIST EVAL & MGMT  01/30/2020   IR RADIOLOGIST EVAL & MGMT  04/30/2020   IR RADIOLOGIST EVAL & MGMT  07/30/2020   IR RADIOLOGIST EVAL & MGMT  10/28/2020   IR RADIOLOGIST EVAL & MGMT  02/24/2021   IR RADIOLOGIST EVAL & MGMT  06/08/2021   IR RADIOLOGIST EVAL & MGMT  11/24/2021   IR RADIOLOGIST EVAL & MGMT  05/25/2022   IR RADIOLOGIST EVAL & MGMT  02/22/2023   IR US  GUIDE VASC ACCESS LEFT  09/21/2019   IR US  GUIDE VASC ACCESS LEFT  10/02/2019   OPEN REDUCTION INTERNAL FIXATION (ORIF) DISTAL RADIAL FRACTURE Right 08/06/2021   Procedure: OPEN REDUCTION INTERNAL FIXATION (ORIF) DISTAL RADIAL FRACTURE;  Surgeon: Kathlynn Sharper, MD;  Location: [facility] ORS;  Service: Orthopedics;  Laterality: Right;    Medical History: 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)    B12 deficiency 04/05/2017   Blood in stool    Cerebral aneurysm    Chronic kidney disease    Diabetes mellitus without complication (HCC)    type 2   Gastric erosion with bleeding    Hepatocellular carcinoma (HCC)    Hyperlipidemia    Hypertension    Lower extremity cellulitis 02/08/2019   Paroxysmal A-fib (HCC)    Seizure disorder (HCC)     Family History: Family History  Problem Relation Age of Onset   Heart attack Brother 74    Social  History   Socioeconomic History   Marital status: Divorced    Spouse name: Not on file   Number of children: Not on file   Years of education: Not on file   Highest education level: Not on file  Occupational History   Not on file  Tobacco Use   Smoking status: Never   Smokeless tobacco: Current    Types: Snuff  Vaping Use   Vaping status: Never Used  Substance and Sexual Activity   Alcohol  use: Yes    Alcohol /week: 5.0 standard drinks of alcohol     Types: 5 Cans of beer per week   Drug use: No   Sexual activity: Not Currently  Other Topics Concern   Not  on file  Social History Narrative   Lives alone   Social Drivers of Health   Financial Resource Strain: Not on file  Food Insecurity: No Food Insecurity (09/27/2023)   Hunger Vital Sign    Worried About Running Out of Food in the Last Year: Never true    Ran Out of Food in the Last Year: Never true  Transportation Needs: No Transportation Needs (09/27/2023)   PRAPARE - Administrator, Civil Service (Medical): No    Lack of Transportation (Non-Medical): No  Physical Activity: Not on file  Stress: Not on file  Social Connections: Moderately Isolated (09/27/2023)   Social Connection and Isolation Panel    Frequency of Communication with Friends and Family: More than three times a week    Frequency of Social Gatherings with Friends and Family: Twice a week    Attends Religious Services: 1 to 4 times per year    Active Member of [employer] or Organizations: No    Attends Banker Meetings: Never    Marital Status: Divorced  Catering manager Violence: Not At Risk (09/27/2023)   Humiliation, Afraid, Rape, and Kick questionnaire    Fear of Current or Ex-Partner: No    Emotionally Abused: No    Physically Abused: No    Sexually Abused: No      Review of Systems  Constitutional:  Negative for fatigue and fever.  HENT:  Negative for congestion, mouth sores and postnasal drip.   Respiratory:  Negative for  cough.   Cardiovascular:  Negative for chest pain.  Genitourinary:  Negative for flank pain.  Psychiatric/Behavioral: Negative.      Vital Signs: BP 121/70   Pulse 64   Temp 98 F (36.7 C)   Resp 16   Ht 5' 5 (1.651 m)   Wt 163 lb (73.9 kg)   SpO2 98%   BMI 27.12 kg/m    Physical Exam Constitutional:      Appearance: Normal appearance.  HENT:     Head: Normocephalic and atraumatic.     Nose: Nose normal.     Mouth/Throat:     Mouth: Mucous membranes are moist.     Pharynx: No posterior oropharyngeal erythema.  Eyes:     Extraocular Movements: Extraocular movements intact.     Pupils: Pupils are equal, round, and reactive to light.  Cardiovascular:     Pulses: Normal pulses.     Heart sounds: Normal heart sounds.  Pulmonary:     Effort: Pulmonary effort is normal.     Breath sounds: Normal breath sounds.  Neurological:     General: No focal deficit present.     Mental Status: She is alert.  Psychiatric:        Mood and Affect: Mood normal.        Behavior: Behavior normal.        Assessment/Plan: 1. Type 2 diabetes mellitus with hyperglycemia, with long-term current use of insulin  (HCC) (Primary) Will continue to use sliding scale, caution with higher dose  - Ambulatory referral to Ophthalmology - Microalbumin / creatinine urine ratio  2. Aortic atherosclerosis Pt is unsbale to take a statin   3. Paroxysmal atrial fibrillation (HCC) Continue on Eliquis    4. Hepatocellular carcinoma (HCC) Pt has not been following with GI or oncology, last MRI is reviewed    5. Statin myopathy She is unable to take a statin due to myopathy   General Counseling: allona gondek understanding of the findings of todays visit  and agrees with plan of treatment. I have discussed any further diagnostic evaluation that may be needed or ordered today. We also reviewed her medications today. she has been encouraged to call the office with any questions or concerns that should  arise related to todays visit.    Orders Placed This Encounter  Procedures   Microalbumin / creatinine urine ratio   Ambulatory referral to Ophthalmology    No orders of the defined types were placed in this encounter.   Total time spent:30 Minutes Time spent includes review of chart, medications, test results, and follow up plan with the patient.   [place] Controlled Substance Database was reviewed by me.   Dr Falisha Osment M [facility]

## 2023-12-14 ENCOUNTER — Telehealth: Payer: Self-pay | Admitting: Internal Medicine

## 2023-12-14 LAB — MICROALBUMIN / CREATININE URINE RATIO
Creatinine, Urine: 188.5 mg/dL
Microalb/Creat Ratio: 10 mg/g{creat} (ref 0–29)
Microalbumin, Urine: 18 ug/mL

## 2023-12-14 NOTE — Telephone Encounter (Signed)
 Awaiting 12/13/23 office notes for Ophthalmology referral-Toni

## 2023-12-19 ENCOUNTER — Other Ambulatory Visit: Payer: Self-pay | Admitting: Cardiology

## 2023-12-19 ENCOUNTER — Other Ambulatory Visit: Payer: Self-pay | Admitting: Physician Assistant

## 2023-12-19 DIAGNOSIS — I480 Paroxysmal atrial fibrillation: Secondary | ICD-10-CM

## 2023-12-19 DIAGNOSIS — R531 Weakness: Secondary | ICD-10-CM | POA: Diagnosis not present

## 2023-12-20 NOTE — Telephone Encounter (Signed)
 Prescription refill request for Eliquis  received. Indication:afib Last office visit:2/25 Scr:1.34  6/25 Age: 68 Weight:73.9  kg  Prescription refilled

## 2023-12-20 NOTE — Telephone Encounter (Signed)
 Please review direction  and is ok to send

## 2023-12-21 DIAGNOSIS — Z8505 Personal history of malignant neoplasm of liver: Secondary | ICD-10-CM | POA: Diagnosis not present

## 2023-12-21 DIAGNOSIS — I5032 Chronic diastolic (congestive) heart failure: Secondary | ICD-10-CM | POA: Diagnosis not present

## 2023-12-21 DIAGNOSIS — E1122 Type 2 diabetes mellitus with diabetic chronic kidney disease: Secondary | ICD-10-CM | POA: Diagnosis not present

## 2023-12-21 DIAGNOSIS — I130 Hypertensive heart and chronic kidney disease with heart failure and stage 1 through stage 4 chronic kidney disease, or unspecified chronic kidney disease: Secondary | ICD-10-CM | POA: Diagnosis not present

## 2023-12-21 DIAGNOSIS — K529 Noninfective gastroenteritis and colitis, unspecified: Secondary | ICD-10-CM | POA: Diagnosis not present

## 2023-12-21 DIAGNOSIS — Z794 Long term (current) use of insulin: Secondary | ICD-10-CM | POA: Diagnosis not present

## 2023-12-21 DIAGNOSIS — I4891 Unspecified atrial fibrillation: Secondary | ICD-10-CM | POA: Diagnosis not present

## 2023-12-21 DIAGNOSIS — D696 Thrombocytopenia, unspecified: Secondary | ICD-10-CM | POA: Diagnosis not present

## 2023-12-21 DIAGNOSIS — N1831 Chronic kidney disease, stage 3a: Secondary | ICD-10-CM | POA: Diagnosis not present

## 2023-12-21 DIAGNOSIS — M109 Gout, unspecified: Secondary | ICD-10-CM | POA: Diagnosis not present

## 2023-12-21 DIAGNOSIS — K5909 Other constipation: Secondary | ICD-10-CM | POA: Diagnosis not present

## 2023-12-21 DIAGNOSIS — Z7901 Long term (current) use of anticoagulants: Secondary | ICD-10-CM | POA: Diagnosis not present

## 2023-12-21 DIAGNOSIS — E876 Hypokalemia: Secondary | ICD-10-CM | POA: Diagnosis not present

## 2023-12-27 ENCOUNTER — Other Ambulatory Visit: Payer: Self-pay | Admitting: Physician Assistant

## 2023-12-27 DIAGNOSIS — E1129 Type 2 diabetes mellitus with other diabetic kidney complication: Secondary | ICD-10-CM

## 2023-12-29 ENCOUNTER — Other Ambulatory Visit: Payer: Self-pay | Admitting: Physician Assistant

## 2023-12-30 ENCOUNTER — Other Ambulatory Visit: Payer: Self-pay | Admitting: Internal Medicine

## 2023-12-30 ENCOUNTER — Other Ambulatory Visit: Payer: Self-pay | Admitting: Physician Assistant

## 2023-12-30 DIAGNOSIS — I480 Paroxysmal atrial fibrillation: Secondary | ICD-10-CM

## 2023-12-30 DIAGNOSIS — F320 Major depressive disorder, single episode, mild: Secondary | ICD-10-CM

## 2023-12-30 DIAGNOSIS — G4700 Insomnia, unspecified: Secondary | ICD-10-CM

## 2023-12-30 NOTE — Telephone Encounter (Signed)
 Eliquis  5mg  refill request received. Patient is 68 years old, weight-73.9kg, Crea-1.34 on 10/03/23, Diagnosis-Afib, and last seen by Dr. Cindie on 06/15/23. Dose is appropriate based on dosing criteria. Will send in refill to requested pharmacy.

## 2023-12-31 ENCOUNTER — Other Ambulatory Visit: Payer: Self-pay | Admitting: Physician Assistant

## 2023-12-31 DIAGNOSIS — M10222 Drug-induced gout, left elbow: Secondary | ICD-10-CM

## 2024-01-06 ENCOUNTER — Other Ambulatory Visit: Payer: Self-pay | Admitting: Physician Assistant

## 2024-01-06 DIAGNOSIS — K7031 Alcoholic cirrhosis of liver with ascites: Secondary | ICD-10-CM

## 2024-01-06 DIAGNOSIS — M10222 Drug-induced gout, left elbow: Secondary | ICD-10-CM

## 2024-01-17 ENCOUNTER — Encounter: Payer: Self-pay | Admitting: Nurse Practitioner

## 2024-01-17 ENCOUNTER — Telehealth: Payer: Self-pay | Admitting: Internal Medicine

## 2024-01-17 ENCOUNTER — Ambulatory Visit (INDEPENDENT_AMBULATORY_CARE_PROVIDER_SITE_OTHER): Admitting: Nurse Practitioner

## 2024-01-17 VITALS — BP 135/65 | HR 67 | Temp 97.0°F | Resp 16 | Ht 65.0 in | Wt 164.2 lb

## 2024-01-17 DIAGNOSIS — E538 Deficiency of other specified B group vitamins: Secondary | ICD-10-CM

## 2024-01-17 DIAGNOSIS — R053 Chronic cough: Secondary | ICD-10-CM | POA: Diagnosis not present

## 2024-01-17 DIAGNOSIS — E1169 Type 2 diabetes mellitus with other specified complication: Secondary | ICD-10-CM

## 2024-01-17 DIAGNOSIS — I152 Hypertension secondary to endocrine disorders: Secondary | ICD-10-CM

## 2024-01-17 DIAGNOSIS — E1159 Type 2 diabetes mellitus with other circulatory complications: Secondary | ICD-10-CM | POA: Diagnosis not present

## 2024-01-17 DIAGNOSIS — E785 Hyperlipidemia, unspecified: Secondary | ICD-10-CM

## 2024-01-17 DIAGNOSIS — E673 Hypervitaminosis D: Secondary | ICD-10-CM

## 2024-01-17 DIAGNOSIS — E1129 Type 2 diabetes mellitus with other diabetic kidney complication: Secondary | ICD-10-CM | POA: Diagnosis not present

## 2024-01-17 LAB — POCT GLYCOSYLATED HEMOGLOBIN (HGB A1C): Hemoglobin A1C: 7.5 % — AB (ref 4.0–5.6)

## 2024-01-17 MED ORDER — BENZONATATE 200 MG PO CAPS: 200.0000 mg | ORAL_CAPSULE | Freq: Three times a day (TID) | ORAL | 2 refills | Status: DC | PRN

## 2024-01-17 NOTE — Telephone Encounter (Signed)
 Ophthalmology referral sent via [facility] to [facility]. Lvm notifying patient. Gave telephone # [phone]

## 2024-01-17 NOTE — Patient Instructions (Signed)
 Increase insulin  dose to 15 units daily after supper.   Continue to use the freestyle sensors to check glucose.

## 2024-01-17 NOTE — Progress Notes (Signed)
 [facility] [address] [place], [place] [address]  Internal MEDICINE  Office Visit Note  Patient Name: Adriana Miller  [id number]  [id number]  Date of Service: 01/17/2024  Chief Complaint  Patient presents with   Acute Visit    Blood sugar issues.     HPI Quina presents for an acute sick visit for issues with glucose levels.  -- A1c was 4.9 in June, when rechecked today, A1c has increased to 7.5 which correlates with her recent high glucose readings. Reports that it has been as high as 401 for 1 episode. Reports that her glucose is usually greater than 250 now most of the time.     Current Medication:  Outpatient Encounter Medications as of 01/17/2024  Medication Sig Note   benzonatate (TESSALON) 200 MG capsule Take 1 capsule (200 mg total) by mouth 3 (three) times daily as needed for cough.    Accu-Chek Softclix Lancets lancets USE AS DIRECTED ONCE DAILY    acetaminophen  (TYLENOL ) 325 MG tablet Take 2 tablets (650 mg total) by mouth every 6 (six) hours as needed for mild pain (or Fever >/= 101). 09/26/2023: prn   Alcohol  Swabs (B-D SINGLE USE SWABS REGULAR) PADS USE AS DIRECTED TWICE DAILY    allopurinol  (ZYLOPRIM ) 100 MG tablet TAKE 1 TABLET BY MOUTH DAILY    apixaban  (ELIQUIS ) 5 MG TABS tablet TAKE ONE (1) TABLET BY MOUTH TWICE DAILY    ascorbic acid  (VITAMIN C ) 500 MG tablet Take 1 tablet (500 mg total) by mouth daily.    Continuous Glucose Sensor (DEXCOM G7 SENSOR) MISC USE AS DIRECTED FOR CONTINUOUS  GLUCOSE MONITORING . CHANGE  SENSOR EVERY 10 DAYS    EMBECTA PEN NEEDLE NANO 32G X 4 MM MISC USE AS DIRECTED WITH TOUJEO     escitalopram  (LEXAPRO ) 10 MG tablet TAKE 1 TABLET BY MOUTH DAILY FOR ANXIETY/SLEEP    ferrous sulfate  325 (65 FE) MG tablet Take 1 tablet (325 mg total) by mouth daily with breakfast.    folic acid  (FOLVITE ) 1 MG tablet Take 1 tablet (1 mg total) by mouth daily.    furosemide  (LASIX ) 20 MG tablet Take 1 tablet (20 mg total) by mouth daily.     glucose blood (ACCU-CHEK GUIDE) test strip Use as instructed twice a day DX E11.65    lidocaine  (LIDODERM ) 5 % Place 1 patch onto the skin every 12 (twelve) hours. Remove & Discard patch within 12 hours or as directed by MD    metoprolol  tartrate (LOPRESSOR ) 25 MG tablet Take 1 tablet (25 mg total) by mouth 2 (two) times daily.    mirtazapine  (REMERON ) 7.5 MG tablet TAKE 1 TABLET BY MOUTH DAILY AT BEDTIME    Multiple Vitamin (MULTIVITAMIN WITH MINERALS) TABS tablet Take 1 tablet by mouth daily.    polyethylene glycol (MIRALAX  / GLYCOLAX ) 17 g packet Take 17 g by mouth daily.    potassium chloride  (KLOR-CON ) 8 MEQ tablet TAKE 1 TABLET BY MOUTH 3 TIMES WEEKLY ON MONDAY, WEDNESDAY, AND FRIDAY    spironolactone  (ALDACTONE ) 25 MG tablet TAKE 1 TABLET BY MOUTH DAILY    thiamine  (VITAMIN B-1) 100 MG tablet Take 1 tablet (100 mg total) by mouth daily.    traZODone  (DESYREL ) 50 MG tablet TAKE 1-2 TABLETS BY MOUTH AT BEDTIME FOR INSOMNIA    [DISCONTINUED] TOUJEO  SOLOSTAR 300 UNIT/ML Solostar Pen INJECT 10 UNITS SUBCUTANEOUSLY DAILY WITH SUPPER    No facility-administered encounter medications on file as of 01/17/2024.      Medical  History: 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)    B12 deficiency 04/05/2017   Blood in stool    Cerebral aneurysm    Chronic kidney disease    Diabetes mellitus without complication (HCC)    type 2   Gastric erosion with bleeding    Hepatocellular carcinoma (HCC)    Hyperlipidemia    Hypertension    Lower extremity cellulitis 02/08/2019   Paroxysmal A-fib (HCC)    Seizure disorder (HCC)      Vital Signs: BP 135/65 Comment: 149/69  Pulse 67   Temp (!) 97 F (36.1 C)   Resp 16   Ht 5' 5 (1.651 m)   Wt 164 lb 3.2 oz (74.5 kg)   SpO2 99%   BMI 27.32 kg/m    Review of Systems  Constitutional:  Negative for fatigue and fever.  HENT:  Negative for congestion, mouth sores and postnasal drip.    Respiratory:  Negative for cough.   Cardiovascular:  Negative for chest pain.  Endocrine: Positive for polydipsia, polyphagia and polyuria.  Genitourinary:  Negative for flank pain.  Psychiatric/Behavioral: Negative.      Physical Exam Vitals reviewed.  Constitutional:      Appearance: Normal appearance.  HENT:     Head: Normocephalic and atraumatic.     Nose: Nose normal.     Mouth/Throat:     Mouth: Mucous membranes are moist.     Pharynx: No posterior oropharyngeal erythema.  Eyes:     Extraocular Movements: Extraocular movements intact.     Pupils: Pupils are equal, round, and reactive to light.  Cardiovascular:     Pulses: Normal pulses.     Heart sounds: Normal heart sounds.  Pulmonary:     Effort: Pulmonary effort is normal.     Breath sounds: Normal breath sounds.  Neurological:     General: No focal deficit present.     Mental Status: She is alert.  Psychiatric:        Mood and Affect: Mood normal.        Behavior: Behavior normal.       Assessment/Plan: 1. Type 2 diabetes mellitus with other diabetic kidney complication (HCC) (Primary) A1c is significantly increased to 7.5 today from 4.9 in June earlier this year. Basal insulin  dose is increased. Follow up in 2 weeks.  - POCT glycosylated hemoglobin (Hb A1C)  2. Hypertension associated with type 2 diabetes mellitus (HCC) Stable, continue medications as prescribed.   3. Hyperlipidemia associated with type 2 diabetes mellitus (HCC) Last lipid panel was checked in July 2023. She is not on statin therapy at this time. Her cholesterol was normal in 2023 except for low HDL.   4. Chronic cough Cough medication prescribed.  - benzonatate (TESSALON) 200 MG capsule; Take 1 capsule (200 mg total) by mouth 3 (three) times daily as needed for cough.  Dispense: 30 capsule; Refill: 2  5. Hypervitaminosis D Vitamin D  level was very high last year, need to recheck lab - Vitamin D  (25 hydroxy)  6. B12  deficiency Repeat lab ordered  - B12 and Folate Panel   General Counseling: Valicia verbalizes understanding of the findings of todays visit and agrees with plan of treatment. I have discussed any further diagnostic evaluation that may be needed or ordered today. We also reviewed her medications today. she has been encouraged to call the office with any questions or concerns that should arise related to todays visit.  Counseling:    Orders Placed This Encounter  Procedures   Vitamin D  (25 hydroxy)   B12 and Folate Panel   POCT glycosylated hemoglobin (Hb A1C)    Meds ordered this encounter  Medications   benzonatate (TESSALON) 200 MG capsule    Sig: Take 1 capsule (200 mg total) by mouth 3 (three) times daily as needed for cough.    Dispense:  30 capsule    Refill:  2    Fill new script today, if not covered by insurance, please use goodrx card thanks.    Return in about 2 weeks (around 01/31/2024) for F/U insulin  dose and for labs, can be with Dorance Spink if lauren not available. .   Controlled Substance Database was reviewed by me for overdose risk score (ORS)  Time spent:30 Minutes Time spent with patient included reviewing progress notes, labs, imaging studies, and discussing plan for follow up.   This patient was seen by Mardy Maxin, FNP-C in collaboration with Dr. Sigrid Bathe as a part of collaborative care agreement.  Shontell Prosser R. Maxin, MSN, FNP-C Internal Medicine

## 2024-01-18 DIAGNOSIS — R531 Weakness: Secondary | ICD-10-CM | POA: Diagnosis not present

## 2024-01-25 ENCOUNTER — Telehealth: Payer: Self-pay | Admitting: Internal Medicine

## 2024-01-25 NOTE — Telephone Encounter (Signed)
 Per Amy w/ [facility], referral has been closed due to patient not returning calls -Andree

## 2024-01-31 ENCOUNTER — Encounter: Payer: Self-pay | Admitting: Nurse Practitioner

## 2024-01-31 ENCOUNTER — Ambulatory Visit (INDEPENDENT_AMBULATORY_CARE_PROVIDER_SITE_OTHER): Admitting: Nurse Practitioner

## 2024-01-31 VITALS — BP 135/65 | HR 70 | Temp 95.7°F | Resp 16 | Ht 65.0 in | Wt 162.6 lb

## 2024-01-31 DIAGNOSIS — E1129 Type 2 diabetes mellitus with other diabetic kidney complication: Secondary | ICD-10-CM

## 2024-01-31 DIAGNOSIS — E1122 Type 2 diabetes mellitus with diabetic chronic kidney disease: Secondary | ICD-10-CM | POA: Diagnosis not present

## 2024-01-31 DIAGNOSIS — I5032 Chronic diastolic (congestive) heart failure: Secondary | ICD-10-CM

## 2024-01-31 DIAGNOSIS — E1159 Type 2 diabetes mellitus with other circulatory complications: Secondary | ICD-10-CM

## 2024-01-31 DIAGNOSIS — I482 Chronic atrial fibrillation, unspecified: Secondary | ICD-10-CM | POA: Diagnosis not present

## 2024-01-31 DIAGNOSIS — I152 Hypertension secondary to endocrine disorders: Secondary | ICD-10-CM

## 2024-01-31 DIAGNOSIS — Z794 Long term (current) use of insulin: Secondary | ICD-10-CM

## 2024-01-31 DIAGNOSIS — R531 Weakness: Secondary | ICD-10-CM | POA: Diagnosis not present

## 2024-01-31 DIAGNOSIS — N1832 Chronic kidney disease, stage 3b: Secondary | ICD-10-CM

## 2024-01-31 MED ORDER — TOUJEO SOLOSTAR 300 UNIT/ML ~~LOC~~ SOPN: 20.0000 [IU] | PEN_INJECTOR | Freq: Every day | SUBCUTANEOUS | Status: AC

## 2024-01-31 NOTE — Patient Instructions (Signed)
 Increase insulin  dose to 20 units daily.   Continue to use dexcom sensor to monitor glucose level  New sensor placed by provider today 01/31/24  Follow up on 03/06/24 with Dr. Sigrid Bathe.

## 2024-01-31 NOTE — Progress Notes (Signed)
 [facility] [address] [place], [place] [address]  Internal MEDICINE  Office Visit Note  Patient Name: Adriana Miller  [id number]  [id number]  Date of Service: 01/31/2024  Chief Complaint  Patient presents with   Diabetes   Hyperlipidemia   Hypertension   Follow-up    HPI Adriana Miller presents for a follow-up visit for uncontrolled glucose. Glucose level is improving but still average is greater than 200. New dexcom sensor was placed by provider on the patient and the previous sensor was removed. Glucose log on dexcom receiver was reviewed by provider as well.  She does have CKD related to her diabetes -- not currently seeing nephrology but she is being monitored by PCP She also has chronic atrial fibrillation and HFpEF -- she sees cardiology, Dr. Mady and Dr. Cindie.  Her BP is controlled with current medications.    Current Medication: Outpatient Encounter Medications as of 01/31/2024  Medication Sig Note   Accu-Chek Softclix Lancets lancets USE AS DIRECTED ONCE DAILY    acetaminophen  (TYLENOL ) 325 MG tablet Take 2 tablets (650 mg total) by mouth every 6 (six) hours as needed for mild pain (or Fever >/= 101). 09/26/2023: prn   Alcohol  Swabs (B-D SINGLE USE SWABS REGULAR) PADS USE AS DIRECTED TWICE DAILY    allopurinol  (ZYLOPRIM ) 100 MG tablet TAKE 1 TABLET BY MOUTH DAILY    apixaban  (ELIQUIS ) 5 MG TABS tablet TAKE ONE (1) TABLET BY MOUTH TWICE DAILY    ascorbic acid  (VITAMIN C ) 500 MG tablet Take 1 tablet (500 mg total) by mouth daily.    benzonatate (TESSALON) 200 MG capsule Take 1 capsule (200 mg total) by mouth 3 (three) times daily as needed for cough.    Continuous Glucose Sensor (DEXCOM G7 SENSOR) MISC USE AS DIRECTED FOR CONTINUOUS  GLUCOSE MONITORING . CHANGE  SENSOR EVERY 10 DAYS    EMBECTA PEN NEEDLE NANO 32G X 4 MM MISC USE AS DIRECTED WITH TOUJEO     escitalopram  (LEXAPRO ) 10 MG tablet TAKE 1 TABLET BY MOUTH DAILY FOR ANXIETY/SLEEP    ferrous sulfate  325  (65 FE) MG tablet Take 1 tablet (325 mg total) by mouth daily with breakfast.    folic acid  (FOLVITE ) 1 MG tablet Take 1 tablet (1 mg total) by mouth daily.    furosemide  (LASIX ) 20 MG tablet Take 1 tablet (20 mg total) by mouth daily.    glucose blood (ACCU-CHEK GUIDE) test strip Use as instructed twice a day DX E11.65    insulin  glargine, 1 Unit Dial , (TOUJEO  SOLOSTAR) 300 UNIT/ML Solostar Pen Inject 20 Units into the skin daily.    lidocaine  (LIDODERM ) 5 % Place 1 patch onto the skin every 12 (twelve) hours. Remove & Discard patch within 12 hours or as directed by MD    metoprolol  tartrate (LOPRESSOR ) 25 MG tablet Take 1 tablet (25 mg total) by mouth 2 (two) times daily.    mirtazapine  (REMERON ) 7.5 MG tablet TAKE 1 TABLET BY MOUTH DAILY AT BEDTIME    Multiple Vitamin (MULTIVITAMIN WITH MINERALS) TABS tablet Take 1 tablet by mouth daily.    polyethylene glycol (MIRALAX  / GLYCOLAX ) 17 g packet Take 17 g by mouth daily.    thiamine  (VITAMIN B-1) 100 MG tablet Take 1 tablet (100 mg total) by mouth daily.    traZODone  (DESYREL ) 50 MG tablet TAKE 1-2 TABLETS BY MOUTH AT BEDTIME FOR INSOMNIA    [DISCONTINUED] potassium chloride  (KLOR-CON ) 8 MEQ tablet TAKE 1 TABLET BY MOUTH 3 TIMES WEEKLY ON  MONDAY, WEDNESDAY, AND FRIDAY    [DISCONTINUED] spironolactone  (ALDACTONE ) 25 MG tablet TAKE 1 TABLET BY MOUTH DAILY    [DISCONTINUED] TOUJEO  SOLOSTAR 300 UNIT/ML Solostar Pen INJECT 10 UNITS SUBCUTANEOUSLY DAILY WITH SUPPER    No facility-administered encounter medications on file as of 01/31/2024.    Surgical History: Past Surgical History:  Procedure Laterality Date   CARDIOVERSION N/A 12/27/2022   Procedure: CARDIOVERSION;  Surgeon: Darliss Rogue, MD;  Location: [facility] ORS;  Service: Cardiovascular;  Laterality: N/A;   CEREBRAL ANEURYSM REPAIR  1991   COLONOSCOPY WITH PROPOFOL  N/A 02/12/2019   Procedure: COLONOSCOPY WITH PROPOFOL ;  Surgeon: Unk Corinn Skiff, MD;  Location: [facility] ENDOSCOPY;  Service:  Gastroenterology;  Laterality: N/A;   ESOPHAGOGASTRODUODENOSCOPY (EGD) WITH PROPOFOL  N/A 02/12/2019   Procedure: ESOPHAGOGASTRODUODENOSCOPY (EGD) WITH PROPOFOL ;  Surgeon: Unk Corinn Skiff, MD;  Location: [facility] ENDOSCOPY;  Service: Gastroenterology;  Laterality: N/A;   IR ANGIOGRAM SELECTIVE EACH ADDITIONAL VESSEL  09/21/2019   IR ANGIOGRAM SELECTIVE EACH ADDITIONAL VESSEL  09/21/2019   IR ANGIOGRAM SELECTIVE EACH ADDITIONAL VESSEL  09/21/2019   IR ANGIOGRAM SELECTIVE EACH ADDITIONAL VESSEL  09/21/2019   IR ANGIOGRAM SELECTIVE EACH ADDITIONAL VESSEL  10/02/2019   IR ANGIOGRAM VISCERAL SELECTIVE  09/21/2019   IR ANGIOGRAM VISCERAL SELECTIVE  09/21/2019   IR ANGIOGRAM VISCERAL SELECTIVE  10/02/2019   IR EMBO TUMOR ORGAN ISCHEMIA INFARCT INC GUIDE ROADMAPPING  10/02/2019   IR EMBO TUMOR ORGAN ISCHEMIA INFARCT INC GUIDE ROADMAPPING  10/02/2019   IR RADIOLOGIST EVAL & MGMT  08/28/2019   IR RADIOLOGIST EVAL & MGMT  01/30/2020   IR RADIOLOGIST EVAL & MGMT  04/30/2020   IR RADIOLOGIST EVAL & MGMT  07/30/2020   IR RADIOLOGIST EVAL & MGMT  10/28/2020   IR RADIOLOGIST EVAL & MGMT  02/24/2021   IR RADIOLOGIST EVAL & MGMT  06/08/2021   IR RADIOLOGIST EVAL & MGMT  11/24/2021   IR RADIOLOGIST EVAL & MGMT  05/25/2022   IR RADIOLOGIST EVAL & MGMT  02/22/2023   IR US  GUIDE VASC ACCESS LEFT  09/21/2019   IR US  GUIDE VASC ACCESS LEFT  10/02/2019   OPEN REDUCTION INTERNAL FIXATION (ORIF) DISTAL RADIAL FRACTURE Right 08/06/2021   Procedure: OPEN REDUCTION INTERNAL FIXATION (ORIF) DISTAL RADIAL FRACTURE;  Surgeon: Kathlynn Sharper, MD;  Location: [facility] ORS;  Service: Orthopedics;  Laterality: Right;    Medical History: 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)    B12 deficiency 04/05/2017   Blood in stool    Cerebral aneurysm    Chronic kidney disease    Diabetes mellitus without complication (HCC)    type 2   Gastric erosion with bleeding    Hepatocellular carcinoma (HCC)     Hyperlipidemia    Hypertension    Lower extremity cellulitis 02/08/2019   Paroxysmal A-fib (HCC)    Seizure disorder (HCC)     Family History: Family History  Problem Relation Age of Onset   Heart attack Brother 69    Social History   Socioeconomic History   Marital status: Divorced    Spouse name: Not on file   Number of children: Not on file   Years of education: Not on file   Highest education level: Not on file  Occupational History   Not on file  Tobacco Use   Smoking status: Never   Smokeless tobacco: Current    Types: Snuff  Vaping Use   Vaping status: Never Used  Substance and Sexual Activity  Alcohol  use: Not Currently    Alcohol /week: 5.0 standard drinks of alcohol     Types: 5 Cans of beer per week   Drug use: No   Sexual activity: Not Currently  Other Topics Concern   Not on file  Social History Narrative   Lives alone   Social Drivers of Health   Financial Resource Strain: Not on file  Food Insecurity: No Food Insecurity (09/27/2023)   Hunger Vital Sign    Worried About Running Out of Food in the Last Year: Never true    Ran Out of Food in the Last Year: Never true  Transportation Needs: No Transportation Needs (09/27/2023)   PRAPARE - Administrator, Civil Service (Medical): No    Lack of Transportation (Non-Medical): No  Physical Activity: Not on file  Stress: Not on file  Social Connections: Moderately Isolated (09/27/2023)   Social Connection and Isolation Panel    Frequency of Communication with Friends and Family: More than three times a week    Frequency of Social Gatherings with Friends and Family: Twice a week    Attends Religious Services: 1 to 4 times per year    Active Member of [employer] or Organizations: No    Attends Banker Meetings: Never    Marital Status: Divorced  Catering Manager Violence: Not At Risk (09/27/2023)   Humiliation, Afraid, Rape, and Kick questionnaire    Fear of Current or Ex-Partner: No     Emotionally Abused: No    Physically Abused: No    Sexually Abused: No      Review of Systems  Constitutional:  Negative for fatigue and fever.  HENT:  Negative for congestion, mouth sores and postnasal drip.   Respiratory:  Negative for cough.   Cardiovascular:  Negative for chest pain.  Endocrine: Positive for polydipsia, polyphagia and polyuria.  Genitourinary:  Negative for flank pain.  Psychiatric/Behavioral: Negative.      Vital Signs: BP 135/65 Comment: 140/67  Pulse 70   Temp (!) 95.7 F (35.4 C)   Resp 16   Ht 5' 5 (1.651 m)   Wt 162 lb 9.6 oz (73.8 kg)   SpO2 97%   BMI 27.06 kg/m    Physical Exam Vitals reviewed.  Constitutional:      Appearance: Normal appearance.  HENT:     Head: Normocephalic and atraumatic.     Nose: Nose normal.     Mouth/Throat:     Mouth: Mucous membranes are moist.     Pharynx: No posterior oropharyngeal erythema.  Eyes:     Extraocular Movements: Extraocular movements intact.     Pupils: Pupils are equal, round, and reactive to light.  Cardiovascular:     Pulses: Normal pulses.     Heart sounds: Normal heart sounds.  Pulmonary:     Effort: Pulmonary effort is normal.     Breath sounds: Normal breath sounds.  Neurological:     General: No focal deficit present.     Mental Status: She is alert.  Psychiatric:        Mood and Affect: Mood normal.        Behavior: Behavior normal.        Assessment/Plan: 1. Type 2 diabetes mellitus with stage 3b chronic kidney disease, with long-term current use of insulin  (HCC) (Primary) Insulin  dose increased to 20 units daily. Continue monitoring glucose with dexcom sensors.   2. Hypertension associated with type 2 diabetes mellitus (HCC) Stable, continue medications as prescribed.  3. Chronic heart failure with preserved ejection fraction (HFpEF) (HCC) Continue medications as prescribed, follow up with cardiology as scheduled   4. Chronic atrial fibrillation with RVR  (HCC) Continue medications as prescribed, follow up with cardiology as scheduled   5. Stage 3b chronic kidney disease (HCC) Continue to monitor labs and continue prescribed medications and follow up with PCP at next visit.    General Counseling: mitsuye schrodt understanding of the findings of todays visit and agrees with plan of treatment. I have discussed any further diagnostic evaluation that may be needed or ordered today. We also reviewed her medications today. she has been encouraged to call the office with any questions or concerns that should arise related to todays visit.    No orders of the defined types were placed in this encounter.   Meds ordered this encounter  Medications   insulin  glargine, 1 Unit Dial , (TOUJEO  SOLOSTAR) 300 UNIT/ML Solostar Pen    Sig: Inject 20 Units into the skin daily.    Return for follow up at next visit with PCP as scheduled .   Total time spent:30 Minutes Time spent includes review of chart, medications, test results, and follow up plan with the patient.   [place] Controlled Substance Database was reviewed by me.  This patient was seen by Mardy Maxin, FNP-C in collaboration with Dr. Sigrid Bathe as a part of collaborative care agreement.   Jonea Bukowski R. Maxin, MSN, FNP-C Internal medicine

## 2024-02-02 ENCOUNTER — Other Ambulatory Visit: Payer: Self-pay | Admitting: Internal Medicine

## 2024-02-06 DIAGNOSIS — E538 Deficiency of other specified B group vitamins: Secondary | ICD-10-CM | POA: Diagnosis not present

## 2024-02-06 DIAGNOSIS — E673 Hypervitaminosis D: Secondary | ICD-10-CM | POA: Diagnosis not present

## 2024-02-07 LAB — B12 AND FOLATE PANEL
Folate: 5.2 ng/mL (ref >3.0)
Vitamin B-12: 602 pg/mL (ref 232–1245)

## 2024-02-07 LAB — VITAMIN D 25 HYDROXY (VIT D DEFICIENCY, FRACTURES): Vit D, 25-Hydroxy: 35 ng/mL (ref 30.0–100.0)

## 2024-02-08 ENCOUNTER — Encounter: Payer: Self-pay | Admitting: *Deleted

## 2024-02-16 ENCOUNTER — Encounter: Payer: Self-pay | Admitting: Nurse Practitioner

## 2024-02-16 DIAGNOSIS — E673 Hypervitaminosis D: Secondary | ICD-10-CM | POA: Insufficient documentation

## 2024-02-16 DIAGNOSIS — R053 Chronic cough: Secondary | ICD-10-CM | POA: Insufficient documentation

## 2024-02-17 ENCOUNTER — Telehealth: Payer: Self-pay | Admitting: Internal Medicine

## 2024-02-17 MED ORDER — POTASSIUM CHLORIDE ER 8 MEQ PO TBCR: EXTENDED_RELEASE_TABLET | ORAL | 0 refills | Status: DC

## 2024-02-17 NOTE — Addendum Note (Signed)
 Addended by: BLUFORD RAMP D on: 02/17/2024 02:50 PM   Modules accepted: Orders

## 2024-02-17 NOTE — Telephone Encounter (Signed)
 Pt's medication was sent to pt's pharmacy as requested. Confirmation received.

## 2024-02-17 NOTE — Telephone Encounter (Signed)
*  STAT* If patient is at the pharmacy, call can be transferred to refill team.   1. Which medications need to be refilled? (please list name of each medication and dose if known)   potassium chloride  (KLOR-CON ) 8 MEQ tablet   2. Would you like to learn more about the convenience, safety, & potential cost savings by using the [facility]?   3. Are you open to using the [facility] (Type [facility]. ).  4. Which pharmacy/location (including street and city if local pharmacy) is medication to be sent to?  [facility] - [address], [place] - [address]   5. Do they need a 30 day or 90 day supply?   30 day  Caller Edwardo) stated patient still has some medication.

## 2024-02-21 ENCOUNTER — Other Ambulatory Visit: Payer: Self-pay | Admitting: Physician Assistant

## 2024-02-21 DIAGNOSIS — K7031 Alcoholic cirrhosis of liver with ascites: Secondary | ICD-10-CM

## 2024-02-29 ENCOUNTER — Encounter: Payer: Self-pay | Admitting: Nurse Practitioner

## 2024-03-06 ENCOUNTER — Ambulatory Visit: Admitting: Internal Medicine

## 2024-03-06 ENCOUNTER — Encounter: Payer: Self-pay | Admitting: Internal Medicine

## 2024-03-06 VITALS — BP 127/71 | HR 68 | Temp 97.8°F | Resp 16 | Ht 66.0 in | Wt 164.0 lb

## 2024-03-06 DIAGNOSIS — I700 Atherosclerosis of aorta: Secondary | ICD-10-CM | POA: Diagnosis not present

## 2024-03-06 DIAGNOSIS — C220 Liver cell carcinoma: Secondary | ICD-10-CM | POA: Diagnosis not present

## 2024-03-06 DIAGNOSIS — G720 Drug-induced myopathy: Secondary | ICD-10-CM

## 2024-03-06 DIAGNOSIS — N1832 Chronic kidney disease, stage 3b: Secondary | ICD-10-CM | POA: Diagnosis not present

## 2024-03-06 DIAGNOSIS — Z794 Long term (current) use of insulin: Secondary | ICD-10-CM

## 2024-03-06 DIAGNOSIS — E1122 Type 2 diabetes mellitus with diabetic chronic kidney disease: Secondary | ICD-10-CM | POA: Diagnosis not present

## 2024-03-06 DIAGNOSIS — Z1231 Encounter for screening mammogram for malignant neoplasm of breast: Secondary | ICD-10-CM

## 2024-03-06 LAB — POCT CBG (FASTING - GLUCOSE)-MANUAL ENTRY: Glucose Fasting, POC: 208 mg/dL — AB (ref 70–99)

## 2024-03-06 MED ORDER — DEXCOM G7 SENSOR MISC: 2 refills | Status: AC

## 2024-03-06 NOTE — Progress Notes (Signed)
 [facility] [address] [place], [place] [address]  Internal MEDICINE  Office Visit Note  Patient Name: Adriana Miller  [id number]  [id number]  Date of Service: 03/06/2024  Chief Complaint  Patient presents with   Follow-up    Diabetes     HPI Patient seen for routine follow-up for diabetes She has been having difficulty using her continuous glucose monitor She is on basal insulin  of 20 units, takes all medications as prescribed 3.  Has home health for aid and assist 4.  Patient is history of cirrhosis of the liver and has hepatocellular carcinoma, however patient has been lost to follow-up with oncology, she was also on hospice for a while 5.  She denies any complaints does not wish to see multiple specialist 6.  Recent cardioversion for atrial fibrillation patient is on Eliquis  7. She is willing to have a mammogram    Surgical History: Past Surgical History:  Procedure Laterality Date   CARDIOVERSION N/A 12/27/2022   Procedure: CARDIOVERSION;  Surgeon: Darliss Rogue, MD;  Location: [facility] ORS;  Service: Cardiovascular;  Laterality: N/A;   CEREBRAL ANEURYSM REPAIR  1991   COLONOSCOPY WITH PROPOFOL  N/A 02/12/2019   Procedure: COLONOSCOPY WITH PROPOFOL ;  Surgeon: Unk Corinn Skiff, MD;  Location: [facility] ENDOSCOPY;  Service: Gastroenterology;  Laterality: N/A;   ESOPHAGOGASTRODUODENOSCOPY (EGD) WITH PROPOFOL  N/A 02/12/2019   Procedure: ESOPHAGOGASTRODUODENOSCOPY (EGD) WITH PROPOFOL ;  Surgeon: Unk Corinn Skiff, MD;  Location: [facility] ENDOSCOPY;  Service: Gastroenterology;  Laterality: N/A;   IR ANGIOGRAM SELECTIVE EACH ADDITIONAL VESSEL  09/21/2019   IR ANGIOGRAM SELECTIVE EACH ADDITIONAL VESSEL  09/21/2019   IR ANGIOGRAM SELECTIVE EACH ADDITIONAL VESSEL  09/21/2019   IR ANGIOGRAM SELECTIVE EACH ADDITIONAL VESSEL  09/21/2019   IR ANGIOGRAM SELECTIVE EACH ADDITIONAL VESSEL  10/02/2019   IR ANGIOGRAM VISCERAL SELECTIVE  09/21/2019   IR ANGIOGRAM VISCERAL SELECTIVE   09/21/2019   IR ANGIOGRAM VISCERAL SELECTIVE  10/02/2019   IR EMBO TUMOR ORGAN ISCHEMIA INFARCT INC GUIDE ROADMAPPING  10/02/2019   IR EMBO TUMOR ORGAN ISCHEMIA INFARCT INC GUIDE ROADMAPPING  10/02/2019   IR RADIOLOGIST EVAL & MGMT  08/28/2019   IR RADIOLOGIST EVAL & MGMT  01/30/2020   IR RADIOLOGIST EVAL & MGMT  04/30/2020   IR RADIOLOGIST EVAL & MGMT  07/30/2020   IR RADIOLOGIST EVAL & MGMT  10/28/2020   IR RADIOLOGIST EVAL & MGMT  02/24/2021   IR RADIOLOGIST EVAL & MGMT  06/08/2021   IR RADIOLOGIST EVAL & MGMT  11/24/2021   IR RADIOLOGIST EVAL & MGMT  05/25/2022   IR RADIOLOGIST EVAL & MGMT  02/22/2023   IR US  GUIDE VASC ACCESS LEFT  09/21/2019   IR US  GUIDE VASC ACCESS LEFT  10/02/2019   OPEN REDUCTION INTERNAL FIXATION (ORIF) DISTAL RADIAL FRACTURE Right 08/06/2021   Procedure: OPEN REDUCTION INTERNAL FIXATION (ORIF) DISTAL RADIAL FRACTURE;  Surgeon: Kathlynn Sharper, MD;  Location: [facility] ORS;  Service: Orthopedics;  Laterality: Right;    Medical History: 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)    B12 deficiency 04/05/2017   Blood in stool    Cerebral aneurysm    Chronic kidney disease    Diabetes mellitus without complication (HCC)    type 2   Gastric erosion with bleeding    Hepatocellular carcinoma (HCC)    Hyperlipidemia    Hypertension    Lower extremity cellulitis 02/08/2019   Paroxysmal A-fib (HCC)    Seizure disorder (HCC)  Family History: Family History  Problem Relation Age of Onset   Heart attack Brother 63    Social History   Socioeconomic History   Marital status: Divorced    Spouse name: Not on file   Number of children: Not on file   Years of education: Not on file   Highest education level: Not on file  Occupational History   Not on file  Tobacco Use   Smoking status: Never   Smokeless tobacco: Current    Types: Snuff  Vaping Use   Vaping status: Never Used  Substance and Sexual Activity   Alcohol  use:  Not Currently    Alcohol /week: 5.0 standard drinks of alcohol     Types: 5 Cans of beer per week   Drug use: No   Sexual activity: Not Currently  Other Topics Concern   Not on file  Social History Narrative   Lives alone   Social Drivers of Health   Financial Resource Strain: Not on file  Food Insecurity: No Food Insecurity (09/27/2023)   Hunger Vital Sign    Worried About Running Out of Food in the Last Year: Never true    Ran Out of Food in the Last Year: Never true  Transportation Needs: No Transportation Needs (09/27/2023)   PRAPARE - Administrator, Civil Service (Medical): No    Lack of Transportation (Non-Medical): No  Physical Activity: Not on file  Stress: Not on file  Social Connections: Moderately Isolated (09/27/2023)   Social Connection and Isolation Panel    Frequency of Communication with Friends and Family: More than three times a week    Frequency of Social Gatherings with Friends and Family: Twice a week    Attends Religious Services: 1 to 4 times per year    Active Member of [employer] or Organizations: No    Attends Banker Meetings: Never    Marital Status: Divorced  Catering Manager Violence: Not At Risk (09/27/2023)   Humiliation, Afraid, Rape, and Kick questionnaire    Fear of Current or Ex-Partner: No    Emotionally Abused: No    Physically Abused: No    Sexually Abused: No      Review of Systems  Constitutional:  Negative for fatigue and fever.  HENT:  Negative for congestion, mouth sores and postnasal drip.   Respiratory:  Negative for cough.   Cardiovascular:  Negative for chest pain.  Genitourinary:  Negative for flank pain.  Psychiatric/Behavioral: Negative.      Vital Signs: BP 127/71   Pulse 68   Temp 97.8 F (36.6 C)   Resp 16   Ht 5' 6 (1.676 m)   Wt 164 lb (74.4 kg)   SpO2 98%   BMI 26.47 kg/m    Physical Exam Constitutional:      Appearance: Normal appearance.  HENT:     Head: Normocephalic and  atraumatic.     Nose: Nose normal.     Mouth/Throat:     Mouth: Mucous membranes are moist.     Pharynx: No posterior oropharyngeal erythema.  Eyes:     Extraocular Movements: Extraocular movements intact.     Pupils: Pupils are equal, round, and reactive to light.  Cardiovascular:     Pulses: Normal pulses.     Heart sounds: Normal heart sounds.  Pulmonary:     Effort: Pulmonary effort is normal.     Breath sounds: Normal breath sounds.  Musculoskeletal:     Right foot: Normal range of motion.  Left foot: Normal range of motion.  Feet:     Right foot:     Protective Sensation: 2 sites tested.  2 sites sensed.     Skin integrity: Skin integrity normal.     Toenail Condition: Right toenails are normal.     Left foot:     Protective Sensation: 2 sites tested.  2 sites sensed.     Skin integrity: Skin integrity normal.     Toenail Condition: Left toenails are normal.  Neurological:     General: No focal deficit present.     Mental Status: She is alert.  Psychiatric:        Mood and Affect: Mood normal.        Behavior: Behavior normal.        Assessment/Plan: 1. Type 2 diabetes mellitus with stage 3b chronic kidney disease, with long-term current use of insulin  (HCC) (Primary) Will continue on 20 units of basal insulin  samples of Dexcom sensor is also given to the patient - POCT CBG (Fasting - Glucose) - Continuous Glucose Sensor (DEXCOM G7 SENSOR) MISC; USE AS DIRECTED FOR CONTINUOUS  GLUCOSE MONITORING . CHANGE  SENSOR EVERY 10 DAYS  Dispense: 10 each; Refill: 2  2. Statin myopathy Patient is unable to tolerate statin therapy, also has hepatocellular carcinoma followed by oncology  3. Hepatocellular carcinoma (HCC) Monitored by oncology  4. Aortic atherosclerosis Will continue to monitor, patient is unable to take statin  5. Visit for screening mammogram Patient is willing to have a mammogram - MM 3D SCREENING MAMMOGRAM BILATERAL BREAST; Future   General  Counseling: Delitha verbalizes understanding of the findings of todays visit and agrees with plan of treatment. I have discussed any further diagnostic evaluation that may be needed or ordered today. We also reviewed her medications today. she has been encouraged to call the office with any questions or concerns that should arise related to todays visit.    Orders Placed This Encounter  Procedures   MM 3D SCREENING MAMMOGRAM BILATERAL BREAST   POCT CBG (Fasting - Glucose)    Meds ordered this encounter  Medications   Continuous Glucose Sensor (DEXCOM G7 SENSOR) MISC    Sig: USE AS DIRECTED FOR CONTINUOUS  GLUCOSE MONITORING . CHANGE  SENSOR EVERY 10 DAYS    Dispense:  10 each    Refill:  2    Please send a replace/new response with 100-Day Supply if appropriate to maximize member benefit. Requesting 1 year supply.    Total time spent:30 Minutes Time spent includes review of chart, medications, test results, and follow up plan with the patient.   [place] Controlled Substance Database was reviewed by me.   Dr Kemya Shed M Nydia Ytuarte Internal medicine

## 2024-03-09 ENCOUNTER — Emergency Department
Admission: EM | Admit: 2024-03-09 | Discharge: 2024-03-09 | Disposition: A | Discharge status: Home / Self Care | Attending: Emergency Medicine | Admitting: Emergency Medicine

## 2024-03-09 ENCOUNTER — Other Ambulatory Visit: Payer: Self-pay | Admitting: Internal Medicine

## 2024-03-09 ENCOUNTER — Emergency Department

## 2024-03-09 ENCOUNTER — Other Ambulatory Visit: Payer: Self-pay

## 2024-03-09 DIAGNOSIS — S22089A Unspecified fracture of T11-T12 vertebra, initial encounter for closed fracture: Secondary | ICD-10-CM | POA: Diagnosis not present

## 2024-03-09 DIAGNOSIS — N189 Chronic kidney disease, unspecified: Secondary | ICD-10-CM | POA: Diagnosis not present

## 2024-03-09 DIAGNOSIS — I129 Hypertensive chronic kidney disease with stage 1 through stage 4 chronic kidney disease, or unspecified chronic kidney disease: Secondary | ICD-10-CM | POA: Insufficient documentation

## 2024-03-09 DIAGNOSIS — W19XXXA Unspecified fall, initial encounter: Secondary | ICD-10-CM

## 2024-03-09 DIAGNOSIS — S22080A Wedge compression fracture of T11-T12 vertebra, initial encounter for closed fracture: Secondary | ICD-10-CM

## 2024-03-09 DIAGNOSIS — W1839XA Other fall on same level, initial encounter: Secondary | ICD-10-CM | POA: Diagnosis not present

## 2024-03-09 DIAGNOSIS — Z85828 Personal history of other malignant neoplasm of skin: Secondary | ICD-10-CM | POA: Diagnosis not present

## 2024-03-09 DIAGNOSIS — E1122 Type 2 diabetes mellitus with diabetic chronic kidney disease: Secondary | ICD-10-CM | POA: Insufficient documentation

## 2024-03-09 DIAGNOSIS — S0990XA Unspecified injury of head, initial encounter: Secondary | ICD-10-CM | POA: Insufficient documentation

## 2024-03-09 DIAGNOSIS — I6782 Cerebral ischemia: Secondary | ICD-10-CM | POA: Insufficient documentation

## 2024-03-09 DIAGNOSIS — S299XXA Unspecified injury of thorax, initial encounter: Secondary | ICD-10-CM | POA: Diagnosis present

## 2024-03-09 LAB — COMPREHENSIVE METABOLIC PANEL WITH GFR
ALT: 10 U/L (ref 0–44)
AST: 38 U/L (ref 15–41)
Albumin: 3.6 g/dL (ref 3.5–5.0)
Alkaline Phosphatase: 98 U/L (ref 38–126)
Anion gap: 20 — ABNORMAL HIGH (ref 5–15)
BUN: 13 mg/dL (ref 8–23)
CO2: 14 mmol/L — ABNORMAL LOW (ref 22–32)
Calcium: 9.6 mg/dL (ref 8.9–10.3)
Chloride: 101 mmol/L (ref 98–111)
Creatinine, Ser: 1.22 mg/dL — ABNORMAL HIGH (ref 0.44–1.00)
GFR, Estimated: 48 mL/min — ABNORMAL LOW (ref >60)
Glucose, Bld: 178 mg/dL — ABNORMAL HIGH (ref 70–99)
Potassium: 5.7 mmol/L — ABNORMAL HIGH (ref 3.5–5.1)
Sodium: 135 mmol/L (ref 135–145)
Total Bilirubin: 0.8 mg/dL (ref 0.0–1.2)
Total Protein: 8.8 g/dL — ABNORMAL HIGH (ref 6.5–8.1)

## 2024-03-09 LAB — CBC WITH DIFFERENTIAL/PLATELET
Abs Immature Granulocytes: 0.03 K/uL (ref 0.00–0.07)
Basophils Absolute: 0 K/uL (ref 0.0–0.1)
Basophils Relative: 0 %
Eosinophils Absolute: 0 K/uL (ref 0.0–0.5)
Eosinophils Relative: 0 %
HCT: 44.3 % (ref 36.0–46.0)
Hemoglobin: 14 g/dL (ref 12.0–15.0)
Immature Granulocytes: 0 %
Lymphocytes Relative: 9 %
Lymphs Abs: 0.8 K/uL (ref 0.7–4.0)
MCH: 30.7 pg (ref 26.0–34.0)
MCHC: 31.6 g/dL (ref 30.0–36.0)
MCV: 97.1 fL (ref 80.0–100.0)
Monocytes Absolute: 0.6 K/uL (ref 0.1–1.0)
Monocytes Relative: 7 %
Neutro Abs: 7.5 K/uL (ref 1.7–7.7)
Neutrophils Relative %: 84 %
Platelets: 141 K/uL — ABNORMAL LOW (ref 150–400)
RBC: 4.56 MIL/uL (ref 3.87–5.11)
RDW: 13.7 % (ref 11.5–15.5)
WBC: 8.9 K/uL (ref 4.0–10.5)
nRBC: 0 % (ref 0.0–0.2)

## 2024-03-09 LAB — CK: Total CK: 399 U/L — ABNORMAL HIGH (ref 38–234)

## 2024-03-09 MED ORDER — IBUPROFEN 600 MG PO TABS
600.0000 mg | ORAL_TABLET | Freq: Once | ORAL | Status: AC
  Administered 2024-03-09: 600 mg via ORAL
  Filled 2024-03-09: qty 1

## 2024-03-09 MED ORDER — OXYCODONE-ACETAMINOPHEN 5-325 MG PO TABS: 1.0000 | ORAL_TABLET | ORAL | 0 refills | Status: DC | PRN

## 2024-03-09 MED ORDER — OXYCODONE-ACETAMINOPHEN 5-325 MG PO TABS
1.0000 | ORAL_TABLET | Freq: Once | ORAL | Status: AC
  Administered 2024-03-09: 1 via ORAL
  Filled 2024-03-09: qty 1

## 2024-03-09 NOTE — ED Notes (Signed)
 [facility] called for transport to residence , spoke with Will

## 2024-03-09 NOTE — ED Triage Notes (Signed)
 Pt brought in by EMS from home for fall. Per pt, she fell last night at about 9pm at laid in the floor until this morning. Denies any loss of consciousness. C/o back pain.

## 2024-03-09 NOTE — ED Notes (Signed)
 Fall bundle in place. Nonskid socks & fall bracelet on pt. Bed alarm activated & audible.

## 2024-03-09 NOTE — ED Provider Notes (Signed)
 [facility] Provider Note   None    (approximate) History  Fall  HPI Adriana Miller is a 68 y.o. female with a past medical history of CKD, type 2 diabetes, seizure disorder, hypertension, hyperlipidemia, alcoholic cirrhosis, past cellular carcinoma, and paroxysmal atrial fibrillation who presents via EMS after a fall from standing last night.  Patient states that she was unable to get up due to severe back pain in the upper lumbar region that is worse with any movement.  Patient denies any other complaints at this time.  Patient states that she did hit her head but had no loss of consciousness. ROS: Patient currently denies any vision changes, tinnitus, difficulty speaking, facial droop, sore throat, chest pain, shortness of breath, abdominal pain, nausea/vomiting/diarrhea, dysuria, or weakness/numbness/paresthesias in any extremity   Physical Exam  Triage Vital Signs: ED Triage Vitals  Encounter Vitals Group     BP 03/09/24 1008 (!) 176/75     Girls Systolic BP Percentile --      Girls Diastolic BP Percentile --      Boys Systolic BP Percentile --      Boys Diastolic BP Percentile --      Pulse Rate 03/09/24 1008 79     Resp 03/09/24 1008 17     Temp 03/09/24 1008 (!) 96.8 F (36 C)     Temp Source 03/09/24 1008 Axillary     SpO2 03/09/24 1008 100 %     Weight 03/09/24 1010 164 lb 0.4 oz (74.4 kg)     Height 03/09/24 1010 5' 6 (1.676 m)     Head Circumference --      Peak Flow --      Pain Score 03/09/24 1010 3     Pain Loc --      Pain Education --      Exclude from Growth Chart --    Most recent vital signs: Vitals:   03/09/24 1100 03/09/24 1326  BP:  (!) 151/62  Pulse:  82  Resp:  20  Temp: 98 F (36.7 C)   SpO2:  100%   General: Awake, oriented x4. CV:  Good peripheral perfusion. Resp:  Normal effort. Abd:  No distention. Other:  Lower thoracic midline tenderness to palpation.  Resting comfortably in no acute distress ED Results /  Procedures / Treatments  Labs (all labs ordered are listed, but only abnormal results are displayed) Labs Reviewed  COMPREHENSIVE METABOLIC PANEL WITH GFR - Abnormal; Notable for the following components:      Result Value   Potassium 5.7 (*)    CO2 14 (*)    Glucose, Bld 178 (*)    Creatinine, Ser 1.22 (*)    Total Protein 8.8 (*)    GFR, Estimated 48 (*)    Anion gap 20 (*)    All other components within normal limits  CBC WITH DIFFERENTIAL/PLATELET - Abnormal; Notable for the following components:   Platelets 141 (*)    All other components within normal limits  CK - Abnormal; Notable for the following components:   Total CK 399 (*)    All other components within normal limits   RADIOLOGY ED MD interpretation: CT of the head without contrast interpreted by me shows no evidence of acute abnormalities including no intracerebral hemorrhage, obvious masses, or significant edema CT of the cervical spine interpreted by me does not show any evidence of acute abnormalities including no acute fracture, malalignment, height loss, or dislocation X-ray of the  lumbar spine shows a acute to subacute compression fracture at T11 - All radiology independently interpreted and agree with radiology assessment Official radiology report(s): No results found. PROCEDURES: Critical Care performed: No Procedures MEDICATIONS ORDERED IN ED: Medications  ibuprofen  (ADVIL ) tablet 600 mg (600 mg Oral Given by Other 03/09/24 1103)  oxyCODONE -acetaminophen  (PERCOCET/ROXICET) 5-325 MG per tablet 1 tablet (1 tablet Oral Given 03/09/24 1324)   IMPRESSION / MDM / ASSESSMENT AND PLAN / ED COURSE  I reviewed the triage vital signs and the nursing notes.                             The patient is on the cardiac monitor to evaluate for evidence of arrhythmia and/or significant heart rate changes. Patient's presentation is most consistent with acute presentation with potential threat to life or bodily  function. Patient is a 68 year old female who presents after mechanical fall from standing requiring EMS to get her up today and requesting evaluation due to back pain DDx: Rhabdomyolysis, AKI, vertebral fracture, musculoskeletal back injury Plan: CBC, CMP, CK, CT of the head/cervical spine, x-ray of the lumbar spine  Radiologic and laboratory evaluation significant for a likely new compression fracture at T11.  I discussed with patient the importance of pain control as well as incentive spirometry.  Patient states that her pain is well-controlled at this time with only a ibuprofen  and Percocet.  Will provide patient prescription for continued Percocet.  Patient also offered a brace however states that she would not wear anyways.  Patient is in agreement to follow-up with neurosurgery for further evaluation and management of this fracture.  Patient given strict return precautions and all questions answered prior to discharge  Dispo: Discharge home with neurosurgical follow-up follow-up   FINAL CLINICAL IMPRESSION(S) / ED DIAGNOSES   Final diagnoses:  Fall, initial encounter  Closed wedge compression fracture of T11 vertebra, initial encounter (HCC)   Rx / DC Orders   ED Discharge Orders          Ordered    oxyCODONE -acetaminophen  (PERCOCET) 5-325 MG tablet  Every 4 hours PRN        03/09/24 1318           Note:  This document was prepared using Dragon voice recognition software and may include unintentional dictation errors.   Parley Pidcock K, MD 03/11/24 [phone]

## 2024-03-12 ENCOUNTER — Telehealth: Payer: Self-pay | Admitting: Physician Assistant

## 2024-03-12 NOTE — Telephone Encounter (Signed)
 Lvm to schedule ED follow up-Toni

## 2024-03-20 ENCOUNTER — Other Ambulatory Visit: Payer: Self-pay | Admitting: Physician Assistant

## 2024-03-20 DIAGNOSIS — G4700 Insomnia, unspecified: Secondary | ICD-10-CM

## 2024-03-20 DIAGNOSIS — M10222 Drug-induced gout, left elbow: Secondary | ICD-10-CM

## 2024-03-22 ENCOUNTER — Encounter: Payer: Self-pay | Admitting: Physician Assistant

## 2024-03-22 ENCOUNTER — Ambulatory Visit (INDEPENDENT_AMBULATORY_CARE_PROVIDER_SITE_OTHER): Admitting: Physician Assistant

## 2024-03-22 VITALS — BP 122/62 | HR 64 | Temp 98.0°F | Resp 16 | Ht 66.0 in | Wt 154.0 lb

## 2024-03-22 DIAGNOSIS — Z9181 History of falling: Secondary | ICD-10-CM | POA: Diagnosis not present

## 2024-03-22 DIAGNOSIS — R053 Chronic cough: Secondary | ICD-10-CM | POA: Diagnosis not present

## 2024-03-22 DIAGNOSIS — S22080D Wedge compression fracture of T11-T12 vertebra, subsequent encounter for fracture with routine healing: Secondary | ICD-10-CM

## 2024-03-22 DIAGNOSIS — E1159 Type 2 diabetes mellitus with other circulatory complications: Secondary | ICD-10-CM

## 2024-03-22 DIAGNOSIS — I152 Hypertension secondary to endocrine disorders: Secondary | ICD-10-CM

## 2024-03-22 NOTE — Progress Notes (Signed)
 [facility] [address] [place], [place] [address]  Internal MEDICINE  Office Visit Note  Patient Name: Adriana Miller  [id number]  [id number]  Date of Service: 03/22/2024  Chief Complaint  Patient presents with   Follow-up    E/D follow-up for fall    HPI Pt is here for routine follow up -Had a fall 2 weeks ago and could not get up. States she lost balance in the bathroom, She doesn't use walking devices in the house, but has walker and cane. Called her aide and had an alarm to call EMS to go to ED -found to have compression fracture at T11 -ED note states she should follow up with Dr. Clois with neurosurgery but pt not aware of this -no more back pain and not taking any meds for pain now -has been coughing for awhile, for a couple months now -had home visit from insurance provider and BP well controlled. Recommended eye and dental exam but pt declines dental at this time. -will schedule eye appt  Current Medication: Outpatient Encounter Medications as of 03/22/2024  Medication Sig Note   Accu-Chek Softclix Lancets lancets USE AS DIRECTED ONCE DAILY    acetaminophen  (TYLENOL ) 325 MG tablet Take 2 tablets (650 mg total) by mouth every 6 (six) hours as needed for mild pain (or Fever >/= 101). 09/26/2023: prn   Alcohol  Swabs (B-D SINGLE USE SWABS REGULAR) PADS USE AS DIRECTED TWICE DAILY    allopurinol  (ZYLOPRIM ) 100 MG tablet TAKE 1 TABLET BY MOUTH DAILY    apixaban  (ELIQUIS ) 5 MG TABS tablet TAKE ONE (1) TABLET BY MOUTH TWICE DAILY    ascorbic acid  (VITAMIN C ) 500 MG tablet Take 1 tablet (500 mg total) by mouth daily.    Continuous Glucose Sensor (DEXCOM G7 SENSOR) MISC USE AS DIRECTED FOR CONTINUOUS  GLUCOSE MONITORING . CHANGE  SENSOR EVERY 10 DAYS    EMBECTA PEN NEEDLE NANO 32G X 4 MM MISC USE AS DIRECTED WITH TOUJEO     escitalopram  (LEXAPRO ) 10 MG tablet TAKE 1 TABLET BY MOUTH DAILY FOR ANXIETY/SLEEP    ferrous sulfate  325 (65 FE) MG tablet Take 1 tablet (325  mg total) by mouth daily with breakfast.    furosemide  (LASIX ) 20 MG tablet Take 1 tablet (20 mg total) by mouth daily.    glucose blood (ACCU-CHEK GUIDE) test strip Use as instructed twice a day DX E11.65    insulin  glargine, 1 Unit Dial , (TOUJEO  SOLOSTAR) 300 UNIT/ML Solostar Pen Inject 20 Units into the skin daily.    lidocaine  (LIDODERM ) 5 % Place 1 patch onto the skin every 12 (twelve) hours. Remove & Discard patch within 12 hours or as directed by MD    metoprolol  tartrate (LOPRESSOR ) 25 MG tablet Take 1 tablet (25 mg total) by mouth 2 (two) times daily.    mirtazapine  (REMERON ) 7.5 MG tablet TAKE 1 TABLET BY MOUTH DAILY AT BEDTIME    Multiple Vitamin (MULTIVITAMIN WITH MINERALS) TABS tablet Take 1 tablet by mouth daily.    oxyCODONE -acetaminophen  (PERCOCET) 5-325 MG tablet Take 1 tablet by mouth every 4 (four) hours as needed for severe pain (pain score 7-10).    potassium chloride  (KLOR-CON ) 8 MEQ tablet TAKE 1 TABLET BY MOUTH 3 TIMES WEEKLY ON MONDAY, WEDNESDAY, AND FRIDAY *PATIENT NEEDS APPOINTMENT*    spironolactone  (ALDACTONE ) 25 MG tablet TAKE 1 TABLET BY MOUTH DAILY FOR CIRRHOSIS OF LIVER    thiamine  (VITAMIN B-1) 100 MG tablet Take 1 tablet (100 mg total) by mouth  daily.    traZODone  (DESYREL ) 50 MG tablet TAKE 1-2 TABLETS BY MOUTH AT BEDTIME FOR INSOMNIA    No facility-administered encounter medications on file as of 03/22/2024.    Surgical History: Past Surgical History:  Procedure Laterality Date   CARDIOVERSION N/A 12/27/2022   Procedure: CARDIOVERSION;  Surgeon: Darliss Rogue, MD;  Location: [facility] ORS;  Service: Cardiovascular;  Laterality: N/A;   CEREBRAL ANEURYSM REPAIR  1991   COLONOSCOPY WITH PROPOFOL  N/A 02/12/2019   Procedure: COLONOSCOPY WITH PROPOFOL ;  Surgeon: Unk Corinn Skiff, MD;  Location: [facility] ENDOSCOPY;  Service: Gastroenterology;  Laterality: N/A;   ESOPHAGOGASTRODUODENOSCOPY (EGD) WITH PROPOFOL  N/A 02/12/2019   Procedure: ESOPHAGOGASTRODUODENOSCOPY  (EGD) WITH PROPOFOL ;  Surgeon: Unk Corinn Skiff, MD;  Location: [facility] ENDOSCOPY;  Service: Gastroenterology;  Laterality: N/A;   IR ANGIOGRAM SELECTIVE EACH ADDITIONAL VESSEL  09/21/2019   IR ANGIOGRAM SELECTIVE EACH ADDITIONAL VESSEL  09/21/2019   IR ANGIOGRAM SELECTIVE EACH ADDITIONAL VESSEL  09/21/2019   IR ANGIOGRAM SELECTIVE EACH ADDITIONAL VESSEL  09/21/2019   IR ANGIOGRAM SELECTIVE EACH ADDITIONAL VESSEL  10/02/2019   IR ANGIOGRAM VISCERAL SELECTIVE  09/21/2019   IR ANGIOGRAM VISCERAL SELECTIVE  09/21/2019   IR ANGIOGRAM VISCERAL SELECTIVE  10/02/2019   IR EMBO TUMOR ORGAN ISCHEMIA INFARCT INC GUIDE ROADMAPPING  10/02/2019   IR EMBO TUMOR ORGAN ISCHEMIA INFARCT INC GUIDE ROADMAPPING  10/02/2019   IR RADIOLOGIST EVAL & MGMT  08/28/2019   IR RADIOLOGIST EVAL & MGMT  01/30/2020   IR RADIOLOGIST EVAL & MGMT  04/30/2020   IR RADIOLOGIST EVAL & MGMT  07/30/2020   IR RADIOLOGIST EVAL & MGMT  10/28/2020   IR RADIOLOGIST EVAL & MGMT  02/24/2021   IR RADIOLOGIST EVAL & MGMT  06/08/2021   IR RADIOLOGIST EVAL & MGMT  11/24/2021   IR RADIOLOGIST EVAL & MGMT  05/25/2022   IR RADIOLOGIST EVAL & MGMT  02/22/2023   IR US  GUIDE VASC ACCESS LEFT  09/21/2019   IR US  GUIDE VASC ACCESS LEFT  10/02/2019   OPEN REDUCTION INTERNAL FIXATION (ORIF) DISTAL RADIAL FRACTURE Right 08/06/2021   Procedure: OPEN REDUCTION INTERNAL FIXATION (ORIF) DISTAL RADIAL FRACTURE;  Surgeon: Kathlynn Sharper, MD;  Location: [facility] ORS;  Service: Orthopedics;  Laterality: Right;    Medical History: 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)    B12 deficiency 04/05/2017   Blood in stool    Cerebral aneurysm    Chronic kidney disease    Diabetes mellitus without complication (HCC)    type 2   Gastric erosion with bleeding    Hepatocellular carcinoma (HCC)    Hyperlipidemia    Hypertension    Lower extremity cellulitis 02/08/2019   Paroxysmal A-fib (HCC)    Seizure disorder (HCC)     Family  History: Family History  Problem Relation Age of Onset   Heart attack Brother 31    Social History   Socioeconomic History   Marital status: Divorced    Spouse name: Not on file   Number of children: Not on file   Years of education: Not on file   Highest education level: Not on file  Occupational History   Not on file  Tobacco Use   Smoking status: Never   Smokeless tobacco: Current    Types: Snuff  Vaping Use   Vaping status: Never Used  Substance and Sexual Activity   Alcohol  use: Not Currently    Alcohol /week: 5.0 standard drinks of alcohol   Types: 5 Cans of beer per week   Drug use: No   Sexual activity: Not Currently  Other Topics Concern   Not on file  Social History Narrative   Lives alone   Social Drivers of Health   Financial Resource Strain: Not on file  Food Insecurity: No Food Insecurity (09/27/2023)   Hunger Vital Sign    Worried About Running Out of Food in the Last Year: Never true    Ran Out of Food in the Last Year: Never true  Transportation Needs: No Transportation Needs (09/27/2023)   PRAPARE - Administrator, Civil Service (Medical): No    Lack of Transportation (Non-Medical): No  Physical Activity: Not on file  Stress: Not on file  Social Connections: Moderately Isolated (09/27/2023)   Social Connection and Isolation Panel    Frequency of Communication with Friends and Family: More than three times a week    Frequency of Social Gatherings with Friends and Family: Twice a week    Attends Religious Services: 1 to 4 times per year    Active Member of [employer] or Organizations: No    Attends Banker Meetings: Never    Marital Status: Divorced  Catering Manager Violence: Not At Risk (09/27/2023)   Humiliation, Afraid, Rape, and Kick questionnaire    Fear of Current or Ex-Partner: No    Emotionally Abused: No    Physically Abused: No    Sexually Abused: No      Review of Systems  Constitutional:  Negative for fatigue  and fever.  HENT:  Negative for congestion, mouth sores and postnasal drip.   Respiratory:  Negative for cough.   Cardiovascular:  Negative for chest pain.  Genitourinary:  Negative for flank pain.  Musculoskeletal:  Positive for gait problem. Negative for back pain.  Skin:  Negative for rash.  Neurological:  Positive for weakness. Negative for light-headedness.  Psychiatric/Behavioral: Negative.  Negative for suicidal ideas. The patient is not nervous/anxious.     Vital Signs: BP 122/62 Comment: 152/53  Pulse 64   Temp 98 F (36.7 C)   Resp 16   Ht 5' 6 (1.676 m)   Wt 154 lb (69.9 kg)   SpO2 97%   BMI 24.86 kg/m    Physical Exam Vitals reviewed.  Constitutional:      Appearance: Normal appearance.  HENT:     Head: Normocephalic and atraumatic.  Eyes:     Extraocular Movements: Extraocular movements intact.  Cardiovascular:     Pulses: Normal pulses.     Heart sounds: Normal heart sounds.  Pulmonary:     Effort: Pulmonary effort is normal.     Breath sounds: Normal breath sounds.  Musculoskeletal:        General: No tenderness.  Skin:    General: Skin is warm and dry.  Neurological:     Mental Status: She is alert. Mental status is at baseline.  Psychiatric:        Mood and Affect: Mood normal.        Behavior: Behavior normal.        Assessment/Plan: 1. History of recent fall (Primary) Advised to use cane and/or walker at home too for safety  2. Compression fracture of T11 vertebra with routine healing, subsequent encounter Denies any pain, per ED should have follow up with neurosurgery and will check on this  3. Chronic cough Will check xray given this has been going on for months - DG Chest 2 View; Future  4. Hypertension associated with type 2 diabetes mellitus (HCC) Well controlled, continue current medications   General Counseling: Tevis verbalizes understanding of the findings of todays visit and agrees with plan of treatment. I have  discussed any further diagnostic evaluation that may be needed or ordered today. We also reviewed her medications today. she has been encouraged to call the office with any questions or concerns that should arise related to todays visit.    Orders Placed This Encounter  Procedures   DG Chest 2 View    No orders of the defined types were placed in this encounter.   This patient was seen by Tinnie Pro, PA-C in collaboration with Dr. Sigrid Bathe as a part of collaborative care agreement.   Total time spent:30 Minutes Time spent includes review of chart, medications, test results, and follow up plan with the patient.      Dr Fozia M Khan Internal medicine

## 2024-03-23 ENCOUNTER — Telehealth: Payer: Self-pay | Admitting: Physician Assistant

## 2024-03-23 NOTE — Telephone Encounter (Signed)
 Lvm notifying patient of Ophthalmology  appointment at [facility]. Friday 03/30/24 @ 1:00.   Also l let her know her mammogram order was faxed to [facility], and they will be calling her to schedule appointment-Toni

## 2024-04-01 ENCOUNTER — Other Ambulatory Visit: Payer: Self-pay | Admitting: Internal Medicine

## 2024-04-06 ENCOUNTER — Ambulatory Visit: Admitting: Physician Assistant

## 2024-04-25 ENCOUNTER — Other Ambulatory Visit: Payer: Self-pay | Admitting: Internal Medicine

## 2024-04-25 ENCOUNTER — Other Ambulatory Visit: Payer: Self-pay | Admitting: Physician Assistant

## 2024-05-15 ENCOUNTER — Emergency Department

## 2024-05-15 ENCOUNTER — Other Ambulatory Visit: Payer: Self-pay

## 2024-05-15 ENCOUNTER — Observation Stay
Admission: RE | Admit: 2024-05-15 | Discharge: 2024-05-18 | Disposition: A | Discharge status: Home Health | Attending: Emergency Medicine | Admitting: Emergency Medicine

## 2024-05-15 DIAGNOSIS — Z8719 Personal history of other diseases of the digestive system: Secondary | ICD-10-CM | POA: Insufficient documentation

## 2024-05-15 DIAGNOSIS — W19XXXA Unspecified fall, initial encounter: Principal | ICD-10-CM | POA: Diagnosis present

## 2024-05-15 DIAGNOSIS — N179 Acute kidney failure, unspecified: Secondary | ICD-10-CM | POA: Diagnosis present

## 2024-05-15 DIAGNOSIS — Z7901 Long term (current) use of anticoagulants: Secondary | ICD-10-CM | POA: Insufficient documentation

## 2024-05-15 DIAGNOSIS — I4891 Unspecified atrial fibrillation: Secondary | ICD-10-CM | POA: Diagnosis present

## 2024-05-15 DIAGNOSIS — N184 Chronic kidney disease, stage 4 (severe): Secondary | ICD-10-CM | POA: Insufficient documentation

## 2024-05-15 DIAGNOSIS — Z79899 Other long term (current) drug therapy: Secondary | ICD-10-CM | POA: Insufficient documentation

## 2024-05-15 DIAGNOSIS — S0003XA Contusion of scalp, initial encounter: Secondary | ICD-10-CM

## 2024-05-15 DIAGNOSIS — I130 Hypertensive heart and chronic kidney disease with heart failure and stage 1 through stage 4 chronic kidney disease, or unspecified chronic kidney disease: Secondary | ICD-10-CM | POA: Insufficient documentation

## 2024-05-15 DIAGNOSIS — Z794 Long term (current) use of insulin: Secondary | ICD-10-CM | POA: Insufficient documentation

## 2024-05-15 DIAGNOSIS — G4700 Insomnia, unspecified: Secondary | ICD-10-CM | POA: Insufficient documentation

## 2024-05-15 DIAGNOSIS — M109 Gout, unspecified: Secondary | ICD-10-CM | POA: Insufficient documentation

## 2024-05-15 DIAGNOSIS — E1122 Type 2 diabetes mellitus with diabetic chronic kidney disease: Secondary | ICD-10-CM | POA: Insufficient documentation

## 2024-05-15 DIAGNOSIS — I4819 Other persistent atrial fibrillation: Principal | ICD-10-CM | POA: Insufficient documentation

## 2024-05-15 DIAGNOSIS — E1129 Type 2 diabetes mellitus with other diabetic kidney complication: Secondary | ICD-10-CM | POA: Diagnosis present

## 2024-05-15 DIAGNOSIS — Z8739 Personal history of other diseases of the musculoskeletal system and connective tissue: Secondary | ICD-10-CM

## 2024-05-15 DIAGNOSIS — F32A Depression, unspecified: Secondary | ICD-10-CM | POA: Diagnosis present

## 2024-05-15 DIAGNOSIS — E871 Hypo-osmolality and hyponatremia: Secondary | ICD-10-CM | POA: Diagnosis present

## 2024-05-15 DIAGNOSIS — C220 Liver cell carcinoma: Secondary | ICD-10-CM | POA: Diagnosis present

## 2024-05-15 DIAGNOSIS — Z87898 Personal history of other specified conditions: Secondary | ICD-10-CM

## 2024-05-15 DIAGNOSIS — R0602 Shortness of breath: Secondary | ICD-10-CM | POA: Insufficient documentation

## 2024-05-15 DIAGNOSIS — M6281 Muscle weakness (generalized): Secondary | ICD-10-CM | POA: Insufficient documentation

## 2024-05-15 DIAGNOSIS — I5033 Acute on chronic diastolic (congestive) heart failure: Secondary | ICD-10-CM

## 2024-05-15 DIAGNOSIS — R2681 Unsteadiness on feet: Secondary | ICD-10-CM | POA: Insufficient documentation

## 2024-05-15 DIAGNOSIS — K7460 Unspecified cirrhosis of liver: Secondary | ICD-10-CM | POA: Diagnosis present

## 2024-05-15 DIAGNOSIS — Z8505 Personal history of malignant neoplasm of liver: Secondary | ICD-10-CM | POA: Insufficient documentation

## 2024-05-15 LAB — CBC WITH DIFFERENTIAL/PLATELET
Abs Immature Granulocytes: 0.04 10*3/uL (ref 0.00–0.07)
Basophils Absolute: 0 10*3/uL (ref 0.0–0.1)
Basophils Relative: 1 %
Eosinophils Absolute: 0 10*3/uL (ref 0.0–0.5)
Eosinophils Relative: 1 %
HCT: 42.3 % (ref 36.0–46.0)
Hemoglobin: 13.5 g/dL (ref 12.0–15.0)
Immature Granulocytes: 1 %
Lymphocytes Relative: 19 %
Lymphs Abs: 1.2 10*3/uL (ref 0.7–4.0)
MCH: 31.3 pg (ref 26.0–34.0)
MCHC: 31.9 g/dL (ref 30.0–36.0)
MCV: 97.9 fL (ref 80.0–100.0)
Monocytes Absolute: 0.4 10*3/uL (ref 0.1–1.0)
Monocytes Relative: 6 %
Neutro Abs: 4.8 10*3/uL (ref 1.7–7.7)
Neutrophils Relative %: 72 %
Platelets: 142 10*3/uL — ABNORMAL LOW (ref 150–400)
RBC: 4.32 MIL/uL (ref 3.87–5.11)
RDW: 13.8 % (ref 11.5–15.5)
WBC: 6.6 10*3/uL (ref 4.0–10.5)
nRBC: 0 % (ref 0.0–0.2)

## 2024-05-15 LAB — BASIC METABOLIC PANEL WITH GFR
Anion gap: 15 (ref 5–15)
BUN: 30 mg/dL — ABNORMAL HIGH (ref 8–23)
CO2: 21 mmol/L — ABNORMAL LOW (ref 22–32)
Calcium: 9.1 mg/dL (ref 8.9–10.3)
Chloride: 95 mmol/L — ABNORMAL LOW (ref 98–111)
Creatinine, Ser: 1.79 mg/dL — ABNORMAL HIGH (ref 0.44–1.00)
GFR, Estimated: 30 mL/min — ABNORMAL LOW
Glucose, Bld: 178 mg/dL — ABNORMAL HIGH (ref 70–99)
Potassium: 4.3 mmol/L (ref 3.5–5.1)
Sodium: 131 mmol/L — ABNORMAL LOW (ref 135–145)

## 2024-05-15 MED ORDER — ACETAMINOPHEN 500 MG PO TABS
500.0000 mg | ORAL_TABLET | Freq: Once | ORAL | Status: AC
  Administered 2024-05-15: 500 mg via ORAL
  Filled 2024-05-15: qty 1

## 2024-05-15 MED ORDER — SODIUM CHLORIDE 0.9 % IV BOLUS
500.0000 mL | Freq: Once | INTRAVENOUS | Status: AC
  Administered 2024-05-15: 500 mL via INTRAVENOUS

## 2024-05-15 NOTE — ED Triage Notes (Signed)
 BIB [facility] from home with CC of fall when reaching down to pick up pillow. On Eliquis . Reports lower back pain and head pain - bump on back of head. A&Ox4 at this time.

## 2024-05-15 NOTE — ED Provider Notes (Signed)
 "  [facility] Provider Note    None    (approximate)   History   Fall  BIB [facility] from home with CC of fall when reaching down to pick up pillow. On Eliquis . Reports lower back pain and head pain - bump on back of head. A&Ox4 at this time.   HPI Adriana Miller is a 69 y.o. female PMH A-fib/flutter on Eliquis , seizure disorder, cerebral aneurysm, alcoholic cirrhosis, CKD, T2DM, hyperlipidemia, HFpEF, prior UTIs, B12 deficiency presents for evaluation after fall -Per EMS, patient had mechanical fall at home in her bedroom.  Did have head strike with hematoma, no LOC.  Vital signs stable.  Also complaining of low back pain. -On my evaluation, patient states that the left out of her bed, she got out of bed to get this in a dark room, she was going to pick it up she tripped and fell hitting the back of her head.  Denies loss of consciousness.  Did take her Eliquis  this evening.  Does endorse urinary frequency though no other recent infectious symptoms.  No preceding palpitations, dizziness.  Has otherwise been in her usual state of health.     Physical Exam   Triage Vital Signs: HR 101 BP 161/96 SpO2 94% Most recent vital signs: Vitals:   05/15/24 2216  Temp: 98.1 F (36.7 C)     General: Awake, no distress.  HEENT: Normocephalic, small hematoma to right occiput.  No midline neck pain or step-off. CV:  Good peripheral perfusion. RRR (HR 90s at time of my eval), RP 2+ Resp:  Normal effort. CTAB Abd:  No distention. Nontender to deep palpation throughout Back:  No midline thoracic tenderness tenderness, does have midline tenderness and flank tenderness throughout low back. Other:  No tenderness palpation throughout bilateral upper and lower extremities.  Pelvis stable, nontender.  No limb length discrepancy nor abnormal overall rotation of bilateral lower extremities.   ED Results / Procedures / Treatments   Labs (all labs ordered are listed, but only  abnormal results are displayed) Labs Reviewed  BASIC METABOLIC PANEL WITH GFR - Abnormal; Notable for the following components:      Result Value   Sodium 131 (*)    Chloride 95 (*)    CO2 21 (*)    Glucose, Bld 178 (*)    BUN 30 (*)    Creatinine, Ser 1.79 (*)    GFR, Estimated 30 (*)    All other components within normal limits  CBC WITH DIFFERENTIAL/PLATELET - Abnormal; Notable for the following components:   Platelets 142 (*)    All other components within normal limits  URINALYSIS, ROUTINE W REFLEX MICROSCOPIC     EKG  Ecg = atrial flutter with variable block, rate 94, no gross ST elevation or depression, no significant repolarization normality, normal axis, normal intervals.  No evidence of ischemia my interpretation.   RADIOLOGY Radiology interpreted by myself radiology reports reviewed.  No acute pathology identified.    PROCEDURES:  Critical Care performed: No  Procedures   MEDICATIONS ORDERED IN ED: Medications  acetaminophen  (TYLENOL ) tablet 500 mg (has no administration in time range)  sodium chloride  0.9 % bolus 500 mL (500 mLs Intravenous New Bag/Given 05/15/24 2329)     IMPRESSION / MDM / ASSESSMENT AND PLAN / ED COURSE  I reviewed the triage vital signs and the nursing notes.  DDX/MDM/AP: Differential diagnosis includes, but is not limited to, likely mechanical fall though given multiple comorbidities and complaint of recent increased urinary frequency will get basic screening labs to ensure no underlying organic precipitant of fall.  Consider intracranial hemorrhage, skull fracture, C-spine fracture, L-spine fracture.  No evidence of other traumatic injuries at this time.  Plan: - Labs - CT head, C-spine, L-spine - Tylenol  - N.p.o. EKG  Patient's presentation is most consistent with acute presentation with potential threat to life or bodily function.  The patient is on the cardiac monitor to evaluate for evidence  of arrhythmia and/or significant heart rate changes.  ED course below.  Labs with mild AKI, suspect likely mild dehydration.  Given small bolus IV fluid.  No traumatic injuries.  In shared decision making, patient prefers discharge home as opposed to admission to the hospital which I do believe is quite reasonable.  Urinalysis pending.  Signed out to oncoming ED provider pending UA.  Clinical Course as of 05/16/24 0035  Tue May 15, 2024  2303 CTH: IMPRESSION: 1. No acute intracranial abnormality. 2. Chronic right pontine, right thalamic, and left basal ganglia lacunar infarcts. 3. Global parenchymal volume loss. Periventricular white matter hypoattenuation consistent with chronic small vessel ischemic change. 4. Right frontoparietal craniotomy.   [MM]  2303 CTLspine: IMPRESSION: 1. No acute fracture or listhesis of the lumbar spine. 2. Severe central canal stenosis at L3-4 and L4-5, with moderate bilateral neural foraminal narrowing at L3-4 and mild left neural foraminal narrowing at L4-5. 3. Chronic thoracolumbar degenerative disc disease with bridging anterior osteophytes, degenerative facet ankylosis at T11-L3, and advanced facet arthrosis at L3-4. 4. Cholelithiasis 5. Peripheral vascular disease   [MM]  2303 CTCSpine: IMPRESSION: 1. No acute findings. 2. Advanced degenerative disc disease at C3-4 with moderate central canal stenosis (AP diameter 6-7 mm) and moderate right/severe left neural foraminal narrowing. 3. Prominent anterior disc osteophyte at C4-5 resulting in mild compression of the posterior hypopharynx. 4. 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   [MM]    Clinical Course User Index [MM] Clarine Ozell LABOR, MD     FINAL CLINICAL IMPRESSION(S) / ED DIAGNOSES   Final diagnoses:  Fall, initial encounter  Contusion of scalp, initial encounter  AKI (acute kidney injury)     Rx / DC Orders   ED Discharge Orders     None        Note:  This  document was prepared using Dragon voice recognition software and may include unintentional dictation errors.   Clarine Ozell LABOR, MD 05/16/24 [phone]  "

## 2024-05-16 ENCOUNTER — Emergency Department

## 2024-05-16 DIAGNOSIS — W19XXXA Unspecified fall, initial encounter: Secondary | ICD-10-CM | POA: Diagnosis not present

## 2024-05-16 DIAGNOSIS — I4821 Permanent atrial fibrillation: Secondary | ICD-10-CM

## 2024-05-16 DIAGNOSIS — C220 Liver cell carcinoma: Secondary | ICD-10-CM | POA: Diagnosis not present

## 2024-05-16 DIAGNOSIS — I5023 Acute on chronic systolic (congestive) heart failure: Secondary | ICD-10-CM | POA: Insufficient documentation

## 2024-05-16 DIAGNOSIS — I5033 Acute on chronic diastolic (congestive) heart failure: Secondary | ICD-10-CM

## 2024-05-16 DIAGNOSIS — N179 Acute kidney failure, unspecified: Secondary | ICD-10-CM | POA: Diagnosis not present

## 2024-05-16 DIAGNOSIS — E871 Hypo-osmolality and hyponatremia: Secondary | ICD-10-CM | POA: Diagnosis not present

## 2024-05-16 DIAGNOSIS — S0003XA Contusion of scalp, initial encounter: Secondary | ICD-10-CM

## 2024-05-16 LAB — BASIC METABOLIC PANEL WITH GFR
Anion gap: 14 (ref 5–15)
BUN: 28 mg/dL — ABNORMAL HIGH (ref 8–23)
CO2: 24 mmol/L (ref 22–32)
Calcium: 8.8 mg/dL — ABNORMAL LOW (ref 8.9–10.3)
Chloride: 101 mmol/L (ref 98–111)
Creatinine, Ser: 1.53 mg/dL — ABNORMAL HIGH (ref 0.44–1.00)
GFR, Estimated: 36 mL/min — ABNORMAL LOW
Glucose, Bld: 155 mg/dL — ABNORMAL HIGH (ref 70–99)
Potassium: 4.2 mmol/L (ref 3.5–5.1)
Sodium: 139 mmol/L (ref 135–145)

## 2024-05-16 LAB — URINALYSIS, ROUTINE W REFLEX MICROSCOPIC
Bilirubin Urine: NEGATIVE
Glucose, UA: NEGATIVE mg/dL
Hgb urine dipstick: NEGATIVE
Ketones, ur: NEGATIVE mg/dL
Leukocytes,Ua: NEGATIVE
Nitrite: NEGATIVE
Protein, ur: NEGATIVE mg/dL
Specific Gravity, Urine: 1.003 — ABNORMAL LOW (ref 1.005–1.030)
pH: 5 (ref 5.0–8.0)

## 2024-05-16 LAB — GLUCOSE, CAPILLARY
Glucose-Capillary: 338 mg/dL — ABNORMAL HIGH (ref 70–99)
Glucose-Capillary: 107 mg/dL — ABNORMAL HIGH (ref 70–99)
Glucose-Capillary: 242 mg/dL — ABNORMAL HIGH (ref 70–99)
Glucose-Capillary: 194 mg/dL — ABNORMAL HIGH (ref 70–99)

## 2024-05-16 LAB — HEMOGLOBIN A1C
Hgb A1c MFr Bld: 7.5 % — ABNORMAL HIGH (ref 4.8–5.6)
Mean Plasma Glucose: 168.55 mg/dL

## 2024-05-16 LAB — TSH: TSH: 1.25 u[IU]/mL (ref 0.350–4.500)

## 2024-05-16 LAB — RESP PANEL BY RT-PCR (RSV, FLU A&B, COVID)  RVPGX2
Influenza A by PCR: NEGATIVE
Influenza B by PCR: NEGATIVE
Resp Syncytial Virus by PCR: NEGATIVE
SARS Coronavirus 2 by RT PCR: NEGATIVE

## 2024-05-16 LAB — PRO BRAIN NATRIURETIC PEPTIDE: Pro Brain Natriuretic Peptide: 271 pg/mL

## 2024-05-16 MED ORDER — ALLOPURINOL 100 MG PO TABS
100.0000 mg | ORAL_TABLET | Freq: Every day | ORAL | Status: DC
  Administered 2024-05-16 – 2024-05-18 (×3): 100 mg via ORAL
  Filled 2024-05-16 (×3): qty 1

## 2024-05-16 MED ORDER — INSULIN GLARGINE-YFGN 100 UNIT/ML ~~LOC~~ SOLN
20.0000 [IU] | Freq: Every day | SUBCUTANEOUS | Status: DC
  Administered 2024-05-16 – 2024-05-18 (×3): 20 [IU] via SUBCUTANEOUS
  Filled 2024-05-16 (×4): qty 0.2

## 2024-05-16 MED ORDER — IPRATROPIUM-ALBUTEROL 0.5-2.5 (3) MG/3ML IN SOLN
3.0000 mL | Freq: Once | RESPIRATORY_TRACT | Status: AC
  Administered 2024-05-16: 3 mL via RESPIRATORY_TRACT
  Filled 2024-05-16: qty 3

## 2024-05-16 MED ORDER — INSULIN ASPART 100 UNIT/ML IJ SOLN
0.0000 [IU] | Freq: Three times a day (TID) | INTRAMUSCULAR | Status: DC
  Administered 2024-05-16: 7 [IU] via SUBCUTANEOUS
  Administered 2024-05-17: 3 [IU] via SUBCUTANEOUS
  Administered 2024-05-17: 9 [IU] via SUBCUTANEOUS
  Administered 2024-05-17 – 2024-05-18 (×2): 2 [IU] via SUBCUTANEOUS
  Filled 2024-05-16: qty 7
  Filled 2024-05-16: qty 2
  Filled 2024-05-16: qty 3
  Filled 2024-05-16: qty 2
  Filled 2024-05-16: qty 9

## 2024-05-16 MED ORDER — FUROSEMIDE 10 MG/ML IJ SOLN
20.0000 mg | Freq: Two times a day (BID) | INTRAMUSCULAR | Status: DC
  Administered 2024-05-16 – 2024-05-17 (×2): 20 mg via INTRAVENOUS
  Filled 2024-05-16 (×2): qty 2

## 2024-05-16 MED ORDER — ACETAMINOPHEN 325 MG PO TABS
650.0000 mg | ORAL_TABLET | Freq: Four times a day (QID) | ORAL | Status: DC | PRN
  Administered 2024-05-16 – 2024-05-17 (×3): 650 mg via ORAL
  Filled 2024-05-16 (×3): qty 2

## 2024-05-16 MED ORDER — ONDANSETRON HCL 4 MG/2ML IJ SOLN: 4.0000 mg | Freq: Four times a day (QID) | INTRAMUSCULAR | Status: DC | PRN

## 2024-05-16 MED ORDER — INSULIN ASPART 100 UNIT/ML IJ SOLN: 0.0000 [IU] | Freq: Every day | INTRAMUSCULAR | Status: DC

## 2024-05-16 MED ORDER — TRAZODONE HCL 50 MG PO TABS: 50.0000 mg | ORAL_TABLET | Freq: Every evening | ORAL | Status: DC | PRN

## 2024-05-16 MED ORDER — ESCITALOPRAM OXALATE 10 MG PO TABS
10.0000 mg | ORAL_TABLET | Freq: Every day | ORAL | Status: DC
  Administered 2024-05-16 – 2024-05-17 (×2): 10 mg via ORAL
  Filled 2024-05-16 (×2): qty 1

## 2024-05-16 MED ORDER — SPIRONOLACTONE 25 MG PO TABS
25.0000 mg | ORAL_TABLET | Freq: Every day | ORAL | Status: DC
  Administered 2024-05-16 – 2024-05-18 (×3): 25 mg via ORAL
  Filled 2024-05-16 (×3): qty 1

## 2024-05-16 MED ORDER — APIXABAN 5 MG PO TABS
5.0000 mg | ORAL_TABLET | Freq: Two times a day (BID) | ORAL | Status: DC
  Administered 2024-05-16 – 2024-05-18 (×5): 5 mg via ORAL
  Filled 2024-05-16 (×5): qty 1

## 2024-05-16 MED ORDER — METOPROLOL TARTRATE 25 MG PO TABS
25.0000 mg | ORAL_TABLET | Freq: Two times a day (BID) | ORAL | Status: DC
  Administered 2024-05-16 – 2024-05-18 (×5): 25 mg via ORAL
  Filled 2024-05-16 (×5): qty 1

## 2024-05-16 MED ORDER — ONDANSETRON HCL 4 MG PO TABS: 4.0000 mg | ORAL_TABLET | Freq: Four times a day (QID) | ORAL | Status: DC | PRN

## 2024-05-16 MED ORDER — HEPARIN SODIUM (PORCINE) 5000 UNIT/ML IJ SOLN: 5000.0000 [IU] | Freq: Three times a day (TID) | INTRAMUSCULAR | Status: DC

## 2024-05-16 NOTE — Discharge Instructions (Addendum)
 Hold furosemide  (lasix )  and potassium for two days then can restart

## 2024-05-16 NOTE — Plan of Care (Signed)

## 2024-05-16 NOTE — H&P (Signed)
 "  History and Physical    Adriana Miller FMW:[id number] DOB: Jul 30, 1955 DOA: 05/15/2024  DOS: the patient was seen and examined on 05/15/2024  PCP: Kristina Tinnie POUR, PA-C   Patient coming from: Home  I have personally briefly reviewed patient's old medical records in [facility] Link  Chief Complaint: Fall at home yesterday  HPI: Adriana Miller is a pleasant 69 y.o. 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  Patient stated that she was trying to reach down to pick up a pillow then she fell down injuring her back and head.  Patient complains of bump on her back of the head.  She denied any losing consciousness.  Patient complains of mild back pain otherwise no other symptoms.  She denied any fever chills nausea vomiting, chest pain, palpitations, fever, dysuria or hematuria.  However, she complains of shortness of breath on exertion.  She also complains some orthopnea but no PND.  ED Course: Upon arrival to the ED, patient is found to be hypoxic requiring oxygen.  Her CT head was negative for acute pathology, CT lumbar spine did not show any acute fractures but she had a severe central canal stenosis between L3-L4 and L4-L5.  CT C-spine showed no acute finding but she has degenerative disc disease.  Chest x-ray did not show any acute pathology.  Her sodium was 131, creatinine was 1.79 slightly up from her baseline 1.2 to, BNP was 271.  There was concern for new hypoxemia, AKI and bilateral lower extremity swelling.  Hospitalist service was consulted for evaluation for admission for acute exacerbation of congestive heart failure.  Review of Systems:  ROS  All other systems negative except as noted in the HPI.  Past Medical  History:  Diagnosis Date   Alcoholic cirrhosis of liver without ascites (HCC)    B12 deficiency 04/05/2017   Blood in stool    Cerebral aneurysm    Chronic kidney disease    Diabetes mellitus without complication (HCC)    type 2   Gastric erosion with bleeding    Hepatocellular carcinoma (HCC)    Hyperlipidemia    Hypertension    Lower extremity cellulitis 02/08/2019   Paroxysmal A-fib (HCC)    Seizure disorder [facility])     Past Surgical History:  Procedure Laterality Date   CARDIOVERSION N/A 12/27/2022   Procedure: CARDIOVERSION;  Surgeon: Darliss Rogue, MD;  Location: [facility] ORS;  Service: Cardiovascular;  Laterality: N/A;   CEREBRAL ANEURYSM REPAIR  1991   COLONOSCOPY WITH PROPOFOL  N/A 02/12/2019   Procedure: COLONOSCOPY WITH PROPOFOL ;  Surgeon: Unk Corinn Skiff, MD;  Location: [facility] ENDOSCOPY;  Service: Gastroenterology;  Laterality: N/A;   ESOPHAGOGASTRODUODENOSCOPY (EGD) WITH PROPOFOL  N/A 02/12/2019   Procedure: ESOPHAGOGASTRODUODENOSCOPY (EGD) WITH PROPOFOL ;  Surgeon: Unk Corinn Skiff, MD;  Location: [facility] ENDOSCOPY;  Service: Gastroenterology;  Laterality: N/A;   IR ANGIOGRAM SELECTIVE EACH ADDITIONAL VESSEL  09/21/2019   IR ANGIOGRAM SELECTIVE EACH ADDITIONAL VESSEL  09/21/2019   IR ANGIOGRAM SELECTIVE EACH ADDITIONAL VESSEL  09/21/2019   IR ANGIOGRAM SELECTIVE EACH ADDITIONAL VESSEL  09/21/2019   IR ANGIOGRAM SELECTIVE EACH ADDITIONAL VESSEL  10/02/2019   IR ANGIOGRAM VISCERAL SELECTIVE  09/21/2019   IR ANGIOGRAM VISCERAL SELECTIVE  09/21/2019   IR ANGIOGRAM VISCERAL SELECTIVE  10/02/2019  IR EMBO TUMOR ORGAN ISCHEMIA INFARCT INC GUIDE ROADMAPPING  10/02/2019   IR EMBO TUMOR ORGAN ISCHEMIA INFARCT INC GUIDE ROADMAPPING  10/02/2019   IR RADIOLOGIST EVAL & MGMT  08/28/2019   IR RADIOLOGIST EVAL & MGMT  01/30/2020   IR RADIOLOGIST EVAL & MGMT  04/30/2020   IR RADIOLOGIST EVAL & MGMT  07/30/2020   IR RADIOLOGIST EVAL & MGMT  10/28/2020   IR RADIOLOGIST EVAL & MGMT  02/24/2021   IR  RADIOLOGIST EVAL & MGMT  06/08/2021   IR RADIOLOGIST EVAL & MGMT  11/24/2021   IR RADIOLOGIST EVAL & MGMT  05/25/2022   IR RADIOLOGIST EVAL & MGMT  02/22/2023   IR US  GUIDE VASC ACCESS LEFT  09/21/2019   IR US  GUIDE VASC ACCESS LEFT  10/02/2019   OPEN REDUCTION INTERNAL FIXATION (ORIF) DISTAL RADIAL FRACTURE Right 08/06/2021   Procedure: OPEN REDUCTION INTERNAL FIXATION (ORIF) DISTAL RADIAL FRACTURE;  Surgeon: Kathlynn Sharper, MD;  Location: [facility] ORS;  Service: Orthopedics;  Laterality: Right;     reports that she has never smoked. Her smokeless tobacco use includes snuff. She reports that she does not currently use alcohol  after a past usage of about 5.0 standard drinks of alcohol  per week. She reports that she does not use drugs.  Allergies[1]  Family History  Problem Relation Age of Onset   Heart attack Brother 63    Prior to Admission medications  Medication Sig Start Date End Date Taking? Authorizing Provider  Accu-Chek Softclix Lancets lancets USE AS DIRECTED ONCE DAILY 04/06/23   McDonough, Lauren K, PA-C  acetaminophen  (TYLENOL ) 325 MG tablet Take 2 tablets (650 mg total) by mouth every 6 (six) hours as needed for mild pain (or Fever >/= 101). 06/19/22   Dorinda Drue DASEN, MD  Alcohol  Swabs (B-D SINGLE USE SWABS REGULAR) PADS USE AS DIRECTED TWICE DAILY 07/26/22   McDonough, Lauren K, PA-C  allopurinol  (ZYLOPRIM ) 100 MG tablet TAKE 1 TABLET BY MOUTH DAILY 01/09/24   McDonough, Lauren K, PA-C  apixaban  (ELIQUIS ) 5 MG TABS tablet TAKE ONE (1) TABLET BY MOUTH TWICE DAILY 12/30/23   Cindie Ole DASEN, MD  ascorbic acid  (VITAMIN C ) 500 MG tablet Take 1 tablet (500 mg total) by mouth daily. 03/01/23   Lenon Marien CROME, MD  Continuous Glucose Sensor (DEXCOM G7 SENSOR) MISC USE AS DIRECTED FOR CONTINUOUS  GLUCOSE MONITORING . CHANGE  SENSOR EVERY 10 DAYS 03/06/24   Khan, Fozia M, MD  EMBECTA PEN NEEDLE NANO 32G X 4 MM MISC USE AS DIRECTED WITH TOUJEO  12/28/23   McDonough, Lauren K, PA-C  escitalopram   (LEXAPRO ) 10 MG tablet TAKE 1 TABLET BY MOUTH DAILY FOR ANXIETY/SLEEP 12/30/23   McDonough, Tinnie POUR, PA-C  ferrous sulfate  325 (65 FE) MG tablet Take 1 tablet (325 mg total) by mouth daily with breakfast. 02/28/23   Lenon Marien CROME, MD  furosemide  (LASIX ) 20 MG tablet TAKE 1 TABLET BY MOUTH EVERY DAY 04/26/24   McDonough, Lauren K, PA-C  glucose blood (ACCU-CHEK GUIDE) test strip Use as instructed twice a day DX E11.65 08/03/22   McDonough, Lauren K, PA-C  insulin  glargine, 1 Unit Dial , (TOUJEO  SOLOSTAR) 300 UNIT/ML Solostar Pen Inject 20 Units into the skin daily. 01/31/24   Liana Fish, NP  lidocaine  (LIDODERM ) 5 % Place 1 patch onto the skin every 12 (twelve) hours. Remove & Discard patch within 12 hours or as directed by MD 10/29/23 10/28/24  Willo Dunnings, MD  metoprolol  tartrate (LOPRESSOR ) 25 MG tablet Take 1 tablet (  25 mg total) by mouth 2 (two) times daily. 10/03/23 03/22/24  Trudy Anthony HERO, MD  mirtazapine  (REMERON ) 7.5 MG tablet TAKE 1 TABLET BY MOUTH DAILY AT BEDTIME 12/06/23   McDonough, Lauren K, PA-C  Multiple Vitamin (MULTIVITAMIN WITH MINERALS) TABS tablet Take 1 tablet by mouth daily. 03/01/23   Lenon Marien CROME, MD  oxyCODONE -acetaminophen  (PERCOCET) 5-325 MG tablet Take 1 tablet by mouth every 4 (four) hours as needed for severe pain (pain score 7-10). 03/09/24   Bradler, Evan K, MD  potassium chloride  (KLOR-CON ) 8 MEQ tablet TAKE 1 TABLET BY MOUTH THREE TIMES WEEKLY ON MONDAY, WEDNESDAY & FRIDAY *LAST ATTEMPT, CALL [phone] TO SCHEDULE WITH DR END FOR REFILL* 04/26/24   End, Lonni, MD  spironolactone  (ALDACTONE ) 25 MG tablet TAKE 1 TABLET BY MOUTH DAILY FOR CIRRHOSIS OF LIVER 02/22/24   McDonough, Lauren K, PA-C  thiamine  (VITAMIN B-1) 100 MG tablet Take 1 tablet (100 mg total) by mouth daily. 01/04/23   McDonough, Tinnie POUR, PA-C  traZODone  (DESYREL ) 50 MG tablet TAKE 1-2 TABLETS BY MOUTH AT BEDTIME FOR INSOMNIA 12/30/23   Kristina Tinnie POUR, PA-C    Physical  Exam: Vitals:   05/16/24 0100 05/16/24 0917 05/16/24 0953 05/16/24 1252  BP: (!) 143/81 (!) 149/75 127/74 96/64  Pulse: (!) 107 (!) 103 (S) (!) 145 (!) 137  Resp: 18  20 20   Temp:   98.8 F (37.1 C) (!) 97.5 F (36.4 C)  TempSrc:   Oral   SpO2: 99%  100% 100%    Physical Exam   Constitutional: Alert, awake, calm, comfortable HEENT: Neck supple Respiratory: Clear to auscultation B/L, no wheezing, no rales.  Cardiovascular: Regular rate and rhythm, no murmurs / rubs / gallops.  Bilateral lower extremity edema 2+. 2+ pedal pulses. No carotid bruits.  Abdomen: Soft, no tenderness, Bowel sounds positive.  Musculoskeletal: no clubbing / cyanosis. Good ROM, no contractures. Normal muscle tone.  Skin: no rashes, lesions, ulcers. Neurologic: CN 2-12 grossly intact. Sensation intact, No focal deficit identified Psychiatric: Alert and oriented x 3. Normal mood.    Labs on Admission: I have personally reviewed following labs and imaging studies  CBC: Recent Labs  Lab 05/15/24 2213  WBC 6.6  NEUTROABS 4.8  HGB 13.5  HCT 42.3  MCV 97.9  PLT 142*   Basic Metabolic Panel: Recent Labs  Lab 05/15/24 2213 05/16/24 1016  NA 131* 139  K 4.3 4.2  CL 95* 101  CO2 21* 24  GLUCOSE 178* 155*  BUN 30* 28*  CREATININE 1.79* 1.53*  CALCIUM  9.1 8.8*   GFR: CrCl cannot be calculated (Unknown ideal weight.). Liver Function Tests: No results for input(s): AST, ALT, ALKPHOS, BILITOT, PROT, ALBUMIN  in the last 168 hours. No results for input(s): LIPASE, AMYLASE in the last 168 hours. No results for input(s): AMMONIA in the last 168 hours. Coagulation Profile: No results for input(s): INR, PROTIME in the last 168 hours. Cardiac Enzymes: No results for input(s): CKTOTAL, CKMB, CKMBINDEX, TROPONINI, TROPONINIHS in the last 168 hours. BNP (last 3 results) Recent Labs    09/26/23 1651  BNP 137.0*   HbA1C: No results for input(s): HGBA1C in the last 72  hours. CBG: No results for input(s): GLUCAP in the last 168 hours. Lipid Profile: No results for input(s): CHOL, HDL, LDLCALC, TRIG, CHOLHDL, LDLDIRECT in the last 72 hours. Thyroid  Function Tests: Recent Labs    05/16/24 1017  TSH 1.250   Anemia Panel: No results for input(s): VITAMINB12, FOLATE, FERRITIN, TIBC, IRON ,  RETICCTPCT in the last 72 hours. Urine analysis:    Component Value Date/Time   COLORURINE STRAW (A) 05/16/2024 0138   APPEARANCEUR CLEAR (A) 05/16/2024 0138   APPEARANCEUR Hazy 10/11/2013 1705   LABSPEC 1.003 (L) 05/16/2024 0138   LABSPEC 1.012 10/11/2013 1705   PHURINE 5.0 05/16/2024 0138   GLUCOSEU NEGATIVE 05/16/2024 0138   GLUCOSEU >=500 10/11/2013 1705   HGBUR NEGATIVE 05/16/2024 0138   BILIRUBINUR NEGATIVE 05/16/2024 0138   BILIRUBINUR Negative 10/11/2013 1705   KETONESUR NEGATIVE 05/16/2024 0138   PROTEINUR NEGATIVE 05/16/2024 0138   NITRITE NEGATIVE 05/16/2024 0138   LEUKOCYTESUR NEGATIVE 05/16/2024 0138   LEUKOCYTESUR 1+ 10/11/2013 1705    Radiological Exams on Admission: I have personally reviewed images DG Chest 2 View Result Date: 05/16/2024 EXAM: 2 VIEW(S) XRAY OF THE CHEST 05/16/2024 03:36:42 AM COMPARISON: 09/26/2023 CLINICAL HISTORY: Slight hypoxia after intravenous fluids, rales in lungs. Query congestive heart failure or edema. FINDINGS: LUNGS AND PLEURA: Low lung volumes. No focal pulmonary opacity. No pleural effusion. No pneumothorax. HEART AND MEDIASTINUM: Atherosclerotic calcification of the aortic arch. No acute abnormality of the cardiac and mediastinal silhouettes. BONES AND SOFT TISSUES: Chronic left proximal humerus deformity. Thoracic spondylosis. IMPRESSION: 1. No radiographic evidence of acute cardiopulmonary disease. Electronically signed by: Dorethia Molt MD 05/16/2024 03:53 AM EST RP Workstation: [id number]   CT Cervical Spine Wo Contrast Result Date: 05/15/2024 EXAM: CT CERVICAL SPINE WITHOUT CONTRAST  05/15/2024 10:28:07 PM TECHNIQUE: CT of the cervical spine was performed without the administration of intravenous contrast. Multiplanar reformatted images are provided for review. Automated exposure control, iterative reconstruction, and/or weight based adjustment of the mA/kV was utilized to reduce the radiation dose to as low as reasonably achievable. COMPARISON: None available. CLINICAL HISTORY: trauma Trauma. FINDINGS: BONES AND ALIGNMENT: Ankylosis of the vertebral bodies of C5-C7. No acute fracture or traumatic malalignment. DEGENERATIVE CHANGES: Prominent anterior disc osteophyte seen at C4-C5 resulting in mild compression of the posterior hypopharynx. Advanced degenerative disc disease C3-C4 with small posterior disc osteophyte complex resulting in moderate central canal stenosis with abutment and flattening of the thecal sac and AP diameter of the spinal canal of 6-7 mm. Similar, though milder changes are seen at C4-C5 and C5-C6 with an AP diameter of the spinal canal of 7 mm. Spinal canal is otherwise widely patent. Uncovertebral arthrosis results in moderate right and severe left neural foraminal narrowing at C3-C4. Remaining neuroforamina are widely patent. SOFT TISSUES: No prevertebral soft tissue swelling. IMPRESSION: 1. No acute findings. 2. Advanced degenerative disc disease at C3-4 with moderate central canal stenosis (AP diameter 6-7 mm) and moderate right/severe left neural foraminal narrowing. 3. Prominent anterior disc osteophyte at C4-5 resulting in mild compression of the posterior hypopharynx. 4. 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 Electronically signed by: Dorethia Molt MD 05/15/2024 10:47 PM EST RP Workstation: [id number]   CT Lumbar Spine Wo Contrast Result Date: 05/15/2024 EXAM: CT OF THE LUMBAR SPINE WITHOUT CONTRAST 05/15/2024 10:28:07 PM TECHNIQUE: CT of the lumbar spine was performed without the administration of intravenous contrast. Multiplanar reformatted images are  provided for review. Automated exposure control, iterative reconstruction, and/or weight based adjustment of the mA/kV was utilized to reduce the radiation dose to as low as reasonably achievable. COMPARISON: None available. CLINICAL HISTORY: trauma Trauma. FINDINGS: BONES AND ALIGNMENT: Normal lumbar lordosis. No acute fracture or listhesis of the lumbar spine. Vertebral body height is preserved. Normal alignment. DEGENERATIVE CHANGES: Disc space narrowing is seen throughout the visualized thoracolumbar spine in keeping with changes  of mild-to-moderate degenerative disc disease, most severe at T10-T12. Bridging anterior disc osteophytes are seen at T10-T11 and L4-S1. There is degenerative ankylosis of the facet joints bilaterally at T11-L3. Advanced facet arthrosis bilaterally at L3-L4 with more moderate degenerative changes noted at L4-L5 and L5-S1. L3-L4: Broad-based disc bulge in combination with hypertrophic facet arthrosis results in moderate bilateral neural foraminal narrowing and severe central canal stenosis. L4-L5: Similar changes at L4-L5 result in mild left neural foraminal narrowing and severe central canal stenosis. SOFT TISSUES: No paraspinal fluid collection or inflammatory change. INCIDENTAL ABDOMINAL FINDINGS: Cholelithiasis. 5 mm punctate cortical calcification within the right kidney, nonspecific. Advanced arteriosclerosis. IMPRESSION: 1. No acute fracture or listhesis of the lumbar spine. 2. Severe central canal stenosis at L3-4 and L4-5, with moderate bilateral neural foraminal narrowing at L3-4 and mild left neural foraminal narrowing at L4-5. 3. Chronic thoracolumbar degenerative disc disease with bridging anterior osteophytes, degenerative facet ankylosis at T11-L3, and advanced facet arthrosis at L3-4. 4. Cholelithiasis 5. Peripheral vascular disease Electronically signed by: Dorethia Molt MD 05/15/2024 10:41 PM EST RP Workstation: [id number]   CT HEAD WO CONTRAST ( ) Result Date:  05/15/2024 EXAM: CT HEAD WITHOUT CONTRAST 05/15/2024 10:28:07 PM TECHNIQUE: CT of the head was performed without the administration of intravenous contrast. Automated exposure control, iterative reconstruction, and/or weight based adjustment of the mA/kV was utilized to reduce the radiation dose to as low as reasonably achievable. COMPARISON: 03/09/2024 CLINICAL HISTORY: Trauma. FINDINGS: BRAIN AND VENTRICLES: No acute hemorrhage. No evidence of acute infarct. Global parenchymal volume loss, commensurate with the patient's age. Periventricular white matter decreased attenuation consistent with small vessel ischemic changes. 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 No hydrocephalus. No extra-axial collection. No mass effect or midline shift. ORBITS: Bilateral lens replacement. Chronic right medial orbital wall fracture. SINUSES: No acute abnormality. SOFT TISSUES AND SKULL: Right frontal parietal craniotomy noted. No acute soft tissue abnormality. No skull fracture. VASCULATURE: Moderate calcific atheromatous disease within the carotid siphons. IMPRESSION: 1. No acute intracranial abnormality. 2. Chronic right pontine, right thalamic, and left basal ganglia lacunar infarcts. 3. Global parenchymal volume loss. Periventricular white matter hypoattenuation consistent with chronic small vessel ischemic change. 4. Right frontoparietal craniotomy. Electronically signed by: Dorethia Molt MD 05/15/2024 10:34 PM EST RP Workstation: [id number]    EKG: My personal interpretation of EKG shows: Atrial flutter with varying degree of AV block with a rate of 94    Assessment/Plan Principal Problem:   Hyponatremia Active Problems:   Atrial fibrillation with RVR (HCC)   Type 2 diabetes mellitus with other diabetic kidney complication (HCC)   Accidental fall   AKI (acute kidney injury)   History of seizure   Hepatocellular carcinoma (HCC)   Acute on chronic diastolic CHF  (congestive heart failure) (HCC)    Assessment and Plan:  69 year old female with multiple medical problems including but not limited to atrial fibrillation, CHF, HTN, DM, seizure disorder, hepatocellular cancer, liver cirrhosis, CKD, history of falls in the past came in to ED after accidental fall at home.  1.  Accidental fall at home - Workup in the emergency room is unremarkable for fractures. - I will give her pain medications and PT/OT.  2.  Chronic persistent atrial fibrillation/flutter - History of DCCV with recurrent flutter in the past - Continue on Eliquis  - Continue metoprolol  - TSH is normal - Continue telemetry monitoring - Cardiology consult requested.  3.  Acute on chronic diastolic congestive heart failure. - I will place her  on Lasix  20 mg twice daily along with the spironolactone  - Daily weight, input output charting - Continue - Will get echocardiogram - More cardiology consult  4.  HTN - Blood pressure is fairly controlled - Continue metoprolol  and spironolactone  along with Lasix . - Continue to monitor blood pressure  5.  AKI on CKD stage IV - Creatinine is 1.79 and baseline is 1.1-1.3 - Continue Lasix  and spironolactone  - Monitor BMP daily - Avoid nephrotoxic drugs except diuretics  6.  History of cirrhosis of liver with hepatic lesions - Continue on spironolactone  - Continue to monitor BMP daily  7.  Type II DM - Continue insulin  sliding scale along with Lantus  20 units at bedtime - Continue monitoring blood glucose  8.  Gout - Continue allopurinol   9.  Insomnia - Continue trazodone   10.  Hyponatremia - May be related to exacerbation of congestive heart failure - Continue to monitor sodium level    DVT prophylaxis: Eliquis  Code Status: Full Code Family Communication: None Disposition Plan: Home Consults called: Cardiology Admission status: Observation, Telemetry bed   Nena Rebel, MD [facility] 05/16/2024, 1:02 PM         [1]  Allergies Allergen Reactions   Aspirin Other (See Comments)    Nose bleed   "

## 2024-05-16 NOTE — Consult Note (Signed)
 "  Cardiology Consultation   Patient ID: Adriana Miller MRN: [id number]; DOB: 1955/10/16  Admit date: 05/15/2024 Date of Consult: 05/16/2024  PCP:  Adriana Tinnie POUR, PA-C   [facility] Providers Cardiologist:  Adriana Hanson, Miller  Electrophysiologist:  Adriana ONEIDA HOLTS, Miller (Inactive)       Patient Profile: Adriana Miller is a 69 y.o. 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  History of Present Illness: Adriana Miller was diagnosed with new onset A-fib in 09/2018 referred to the ED where she was placed on diltiazem  and apixaban .  Plan was to pursue TEE guided DCCV but established with cardiology on 12/2018.  She spontaneously converted to sinus rhythm.  Echocardiogram 12/2018 showed EF 55 to 60%, no LV systolic function and ventricular cavity size, trivial tricuspid regurgitation.  She was diagnosed with a flutter in 05/2019 with subsequent spontaneous conversion noted in follow-up in 06/2022.  She was noted back in A-fib with RVR on 07/2019 transferred to the ED where she was hydrated and administered IV diltiazem  for rate control with outpatient follow-up.  She had multiple hospital admissions since that time including mechanical falls associated with rhabdomyolysis in 05/2022.  In 08/2021 she had intractable nausea and vomiting and atrial fibrillation with RVR with symptomatic improvement with IV hydration and rate control.  Echocardiogram 08/2022 showed LVEF 55 to 60%, no RWMA, normal LV systolic function and ventricular cavity size, mildly to moderately dilated left atrium, no significant valvular abnormalities.  She was  last seen in clinic 06/15/2023 by Adriana. Holts from EP.  At that time she was considered in persistent atrial fibrillation and atrial flutter.  She was continued on apixaban  for stroke prophylaxis.  With her extensive medical comorbidities she was not a candidate for invasive management of her arrhythmias.  Medical management continued to be difficult given her history of cirrhosis and CKD.  She presented to the [facility] emergency department on 05/15/2024 via EMS with a chief complaint of fall when she was reaching down to pick up a pillow.  She reported lower back pain and head pain with a bump in the back of her head.  She denies any loss of consciousness and was alert and oriented x 4 at the time.  Vital signs had remained stable.  Initial vitals revealed a blood pressure of 161/96, heart rate of 101, oxygen saturations at 94% and temperature 98.1.  Labs were pertinent for sodium of 131, chloride of 95, CO2 21, blood glucose 178, BUN of 30, serum creatinine 1.79, GFR of 30, platelets of 142.  proBNP of 271, respiratory panel was negative for COVID, flu, RSV.  With mild AKI noted on labs suspect likely mild dehydration she was given a small bolus of IV fluid.  Imaging was unrevealing.  Cardiology was consulted to assist with concerns for possible congestive heart failure.  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)    B12 deficiency 04/05/2017   Blood in stool    Cerebral aneurysm    Chronic kidney disease    Diabetes mellitus without complication (HCC)    type 2   Gastric erosion with bleeding    Hepatocellular carcinoma (HCC)  Hyperlipidemia    Hypertension    Lower extremity cellulitis 02/08/2019   Paroxysmal A-fib (HCC)    Seizure disorder [facility])     Past Surgical History:  Procedure Laterality Date   CARDIOVERSION N/A 12/27/2022   Procedure: CARDIOVERSION;  Surgeon: Adriana Miller;  Location: [facility] ORS;  Service: Cardiovascular;   Laterality: N/A;   CEREBRAL ANEURYSM REPAIR  1991   COLONOSCOPY WITH PROPOFOL  N/A 02/12/2019   Procedure: COLONOSCOPY WITH PROPOFOL ;  Surgeon: Adriana Miller;  Location: [facility] ENDOSCOPY;  Service: Gastroenterology;  Laterality: N/A;   ESOPHAGOGASTRODUODENOSCOPY (EGD) WITH PROPOFOL  N/A 02/12/2019   Procedure: ESOPHAGOGASTRODUODENOSCOPY (EGD) WITH PROPOFOL ;  Surgeon: Adriana Miller;  Location: [facility] ENDOSCOPY;  Service: Gastroenterology;  Laterality: N/A;   IR ANGIOGRAM SELECTIVE EACH ADDITIONAL VESSEL  09/21/2019   IR ANGIOGRAM SELECTIVE EACH ADDITIONAL VESSEL  09/21/2019   IR ANGIOGRAM SELECTIVE EACH ADDITIONAL VESSEL  09/21/2019   IR ANGIOGRAM SELECTIVE EACH ADDITIONAL VESSEL  09/21/2019   IR ANGIOGRAM SELECTIVE EACH ADDITIONAL VESSEL  10/02/2019   IR ANGIOGRAM VISCERAL SELECTIVE  09/21/2019   IR ANGIOGRAM VISCERAL SELECTIVE  09/21/2019   IR ANGIOGRAM VISCERAL SELECTIVE  10/02/2019   IR EMBO TUMOR ORGAN ISCHEMIA INFARCT INC GUIDE ROADMAPPING  10/02/2019   IR EMBO TUMOR ORGAN ISCHEMIA INFARCT INC GUIDE ROADMAPPING  10/02/2019   IR RADIOLOGIST EVAL & MGMT  08/28/2019   IR RADIOLOGIST EVAL & MGMT  01/30/2020   IR RADIOLOGIST EVAL & MGMT  04/30/2020   IR RADIOLOGIST EVAL & MGMT  07/30/2020   IR RADIOLOGIST EVAL & MGMT  10/28/2020   IR RADIOLOGIST EVAL & MGMT  02/24/2021   IR RADIOLOGIST EVAL & MGMT  06/08/2021   IR RADIOLOGIST EVAL & MGMT  11/24/2021   IR RADIOLOGIST EVAL & MGMT  05/25/2022   IR RADIOLOGIST EVAL & MGMT  02/22/2023   IR US  GUIDE VASC ACCESS LEFT  09/21/2019   IR US  GUIDE VASC ACCESS LEFT  10/02/2019   OPEN REDUCTION INTERNAL FIXATION (ORIF) DISTAL RADIAL FRACTURE Right 08/06/2021   Procedure: OPEN REDUCTION INTERNAL FIXATION (ORIF) DISTAL RADIAL FRACTURE;  Surgeon: Adriana Miller;  Location: [facility] ORS;  Service: Orthopedics;  Laterality: Right;     Home Medications:  Prior to Admission medications  Medication Sig Start Date End Date Taking? Authorizing Provider  Accu-Chek Softclix  Lancets lancets USE AS DIRECTED ONCE DAILY 04/06/23   McDonough, Lauren K, PA-C  acetaminophen  (TYLENOL ) 325 MG tablet Take 2 tablets (650 mg total) by mouth every 6 (six) hours as needed for mild pain (or Fever >/= 101). 06/19/22   Dorinda Drue DASEN, Miller  Alcohol  Swabs (B-D SINGLE USE SWABS REGULAR) PADS USE AS DIRECTED TWICE DAILY 07/26/22   McDonough, Lauren K, PA-C  allopurinol  (ZYLOPRIM ) 100 MG tablet TAKE 1 TABLET BY MOUTH DAILY 01/09/24   McDonough, Lauren K, PA-C  apixaban  (ELIQUIS ) 5 MG TABS tablet TAKE ONE (1) TABLET BY MOUTH TWICE DAILY 12/30/23   Cindie Adriana DASEN, Miller  ascorbic acid  (VITAMIN C ) 500 MG tablet Take 1 tablet (500 mg total) by mouth daily. 03/01/23   Lenon Marien CROME, Miller  Continuous Glucose Sensor (DEXCOM G7 SENSOR) MISC USE AS DIRECTED FOR CONTINUOUS  GLUCOSE MONITORING . CHANGE  SENSOR EVERY 10 DAYS 03/06/24   Khan, Fozia M, Miller  EMBECTA PEN NEEDLE NANO 32G X 4 MM MISC USE AS DIRECTED WITH TOUJEO  12/28/23   McDonough, Lauren K, PA-C  escitalopram  (LEXAPRO ) 10 MG tablet TAKE 1 TABLET BY MOUTH DAILY FOR ANXIETY/SLEEP 12/30/23  McDonough, Tinnie POUR, PA-C  ferrous sulfate  325 (65 FE) MG tablet Take 1 tablet (325 mg total) by mouth daily with breakfast. 02/28/23   Lenon Marien CROME, Miller  furosemide  (LASIX ) 20 MG tablet TAKE 1 TABLET BY MOUTH EVERY DAY 04/26/24   McDonough, Lauren K, PA-C  glucose blood (ACCU-CHEK GUIDE) test strip Use as instructed twice a day DX E11.65 08/03/22   McDonough, Lauren K, PA-C  insulin  glargine, 1 Unit Dial , (TOUJEO  SOLOSTAR) 300 UNIT/ML Solostar Pen Inject 20 Units into the skin daily. 01/31/24   Liana Fish, NP  lidocaine  (LIDODERM ) 5 % Place 1 patch onto the skin every 12 (twelve) hours. Remove & Discard patch within 12 hours or as directed by Miller 10/29/23 10/28/24  Willo Dunnings, Miller  metoprolol  tartrate (LOPRESSOR ) 25 MG tablet Take 1 tablet (25 mg total) by mouth 2 (two) times daily. 10/03/23 03/22/24  Trudy Anthony HERO, Miller  mirtazapine  (REMERON ) 7.5  MG tablet TAKE 1 TABLET BY MOUTH DAILY AT BEDTIME 12/06/23   McDonough, Lauren K, PA-C  Multiple Vitamin (MULTIVITAMIN WITH MINERALS) TABS tablet Take 1 tablet by mouth daily. 03/01/23   Lenon Marien CROME, Miller  oxyCODONE -acetaminophen  (PERCOCET) 5-325 MG tablet Take 1 tablet by mouth every 4 (four) hours as needed for severe pain (pain score 7-10). 03/09/24   Bradler, Evan Miller, Miller  potassium chloride  (KLOR-CON ) 8 MEQ tablet TAKE 1 TABLET BY MOUTH THREE TIMES WEEKLY ON MONDAY, WEDNESDAY & FRIDAY *LAST ATTEMPT, CALL [phone] TO SCHEDULE WITH Adriana END FOR REFILL* 04/26/24   End, Lonni, Miller  spironolactone  (ALDACTONE ) 25 MG tablet TAKE 1 TABLET BY MOUTH DAILY FOR CIRRHOSIS OF LIVER 02/22/24   McDonough, Lauren K, PA-C  thiamine  (VITAMIN B-1) 100 MG tablet Take 1 tablet (100 mg total) by mouth daily. 01/04/23   McDonough, Lauren K, PA-C  traZODone  (DESYREL ) 50 MG tablet TAKE 1-2 TABLETS BY MOUTH AT BEDTIME FOR INSOMNIA 12/30/23   McDonough, Lauren K, PA-C    Scheduled Meds:  allopurinol   100 mg Oral Daily   apixaban   5 mg Oral BID   escitalopram   10 mg Oral QHS   furosemide   20 mg Intravenous BID   insulin  aspart  0-5 Units Subcutaneous QHS   insulin  aspart  0-9 Units Subcutaneous TID WC   insulin  glargine  20 Units Subcutaneous Daily   metoprolol  tartrate  25 mg Oral BID   spironolactone   25 mg Oral Daily   Continuous Infusions:  PRN Meds: acetaminophen , ondansetron  **OR** ondansetron  (ZOFRAN ) IV, traZODone   Allergies:   Allergies[1]  Social History:   Social History   Socioeconomic History   Marital status: Divorced    Spouse name: Not on file   Number of children: Not on file   Years of education: Not on file   Highest education level: Not on file  Occupational History   Not on file  Tobacco Use   Smoking status: Never   Smokeless tobacco: Current    Types: Snuff  Vaping Use   Vaping status: Never Used  Substance and Sexual Activity   Alcohol  use: Not Currently     Alcohol /week: 5.0 standard drinks of alcohol     Types: 5 Cans of beer per week   Drug use: No   Sexual activity: Not Currently  Other Topics Concern   Not on file  Social History Narrative   Lives alone   Social Drivers of Health   Tobacco Use: High Risk (05/15/2024)   Patient History    Smoking Tobacco Use: Never  Smokeless Tobacco Use: Current    Passive Exposure: Not on file  Financial Resource Strain: Not on file  Food Insecurity: No Food Insecurity (05/16/2024)   Epic    Worried About Programme Researcher, Broadcasting/film/video in the Last Year: Never true    Ran Out of Food in the Last Year: Never true  Transportation Needs: No Transportation Needs (05/16/2024)   Epic    Lack of Transportation (Medical): No    Lack of Transportation (Non-Medical): No  Physical Activity: Not on file  Stress: Not on file  Social Connections: Socially Isolated (05/16/2024)   Social Connection and Isolation Panel    Frequency of Communication with Friends and Family: More than three times a week    Frequency of Social Gatherings with Friends and Family: More than three times a week    Attends Religious Services: Never    Database Administrator or Organizations: No    Attends Banker Meetings: Never    Marital Status: Divorced  Catering Manager Violence: Unknown (05/16/2024)   Epic    Fear of Current or Ex-Partner: No    Emotionally Abused: No    Physically Abused: No    Sexually Abused: Not on file  Depression (PHQ2-9): Low Risk (03/06/2024)   Depression (PHQ2-9)    PHQ-2 Score: 0  Alcohol  Screen: Low Risk (06/06/2023)   Alcohol  Screen    Last Alcohol  Screening Score (AUDIT): 2  Housing: Low Risk (05/16/2024)   Epic    Unable to Pay for Housing in the Last Year: No    Number of Times Moved in the Last Year: 0    Homeless in the Last Year: No  Utilities: Not At Risk (05/16/2024)   Epic    Threatened with loss of utilities: No  Health Literacy: Not on file    Family History:    Family  History  Problem Relation Age of Onset   Heart attack Brother 22     ROS:  Please see the history of present illness.  Review of Systems  Musculoskeletal:  Positive for back pain and falls.    All other ROS reviewed and negative.     Physical Exam/Data: Vitals:   05/15/24 2216 05/16/24 0100 05/16/24 0917 05/16/24 0953  BP:  (!) 143/81 (!) 149/75 127/74  Pulse:  (!) 107 (!) 103 (S) (!) 145  Resp:  18  20  Temp: 98.1 F (36.7 C)   98.8 F (37.1 C)  TempSrc: Oral   Oral  SpO2:  99%  100%   No intake or output data in the 24 hours ending 05/16/24 1231    03/22/2024    2:01 PM 03/09/2024   10:10 AM 03/06/2024   10:52 AM  Last 3 Weights  Weight (lbs) 154 lb 164 lb 0.4 oz 164 lb  Weight (kg) 69.854 kg 74.4 kg 74.39 kg     There is no height or weight on file to calculate BMI.  General:  Well nourished, well developed, in no acute distress HEENT: normal Neck: no JVD Vascular: No carotid bruits; Distal pulses 2+ bilaterally Cardiac:  normal S1, S2; IR IR; no murmur  Lungs:  clear to auscultation bilaterally, no wheezing, rhonchi or rales, respirations are unlabored at rest on nasal cannula 2 L Abd: soft, nontender, no hepatomegaly  Ext: Trace pretibial edema Musculoskeletal:  No deformities, BUE and BLE strength normal and equal Skin: warm and dry  Neuro:  CNs 2-12 intact, no focal abnormalities noted Psych:  Normal affect  EKG:  The EKG was personally reviewed and demonstrates: Atrial flutter with varying AV block with a rate of 94 Telemetry:  Telemetry was personally reviewed and demonstrates: A flutter with varying AV block  Relevant CV Studies: 2D echo 08/22/2022: 1. Left ventricular ejection fraction, by estimation, is 55 to 60%. The  left ventricle has normal function. The left ventricle has no regional  wall motion abnormalities. Left ventricular diastolic parameters are  indeterminate.   2. Right ventricular systolic function is normal. The right ventricular   size is normal.   3. Left atrial size was mild to moderately dilated.   4. The mitral valve is normal in structure. No evidence of mitral valve  regurgitation.   5. The aortic valve is bicuspid. Aortic valve regurgitation is not  visualized.  __________   2D echo 01/15/2019: 1. Left ventricular ejection fraction, by visual estimation, is 55 to  60%. The left ventricle has normal function. Normal left ventricular size.  There is no left ventricular hypertrophy.   2. Global right ventricle has normal systolic function.The right  ventricular size is normal. No increase in right ventricular wall  thickness.   3. Left atrial size was normal.   4. Right atrial size was normal.   5. The mitral valve is normal in structure. No evidence of mitral valve  regurgitation. No evidence of mitral stenosis.   6. The tricuspid valve is normal in structure. Tricuspid valve  regurgitation is trivial.   7. The aortic valve is normal in structure. Aortic valve regurgitation  was not visualized by color flow Doppler. Structurally normal aortic  valve, with no evidence of sclerosis or stenosis.   8. The pulmonic valve was normal in structure. Pulmonic valve  regurgitation is trivial by color flow Doppler.   9. TR signal is inadequate for assessing pulmonary artery systolic  pressure.  10. The inferior vena cava is normal in size with greater than 50%  respiratory variability, suggesting right atrial pressure of 3 mmHg.     Laboratory Data: High Sensitivity Troponin:  No results for input(s): TROPONINIHS in the last 720 hours. No results for input(s): TRNPT in the last 720 hours.    Chemistry Recent Labs  Lab 05/15/24 2213  NA 131*  Miller 4.3  CL 95*  CO2 21*  GLUCOSE 178*  BUN 30*  CREATININE 1.79*  CALCIUM  9.1  GFRNONAA 30*  ANIONGAP 15    No results for input(s): PROT, ALBUMIN , AST, ALT, ALKPHOS, BILITOT in the last 168 hours. Lipids No results for input(s): CHOL, TRIG,  HDL, LABVLDL, LDLCALC, CHOLHDL in the last 168 hours.  Hematology Recent Labs  Lab 05/15/24 2213  WBC 6.6  RBC 4.32  HGB 13.5  HCT 42.3  MCV 97.9  MCH 31.3  MCHC 31.9  RDW 13.8  PLT 142*   Thyroid   Recent Labs  Lab 05/16/24 1017  TSH 1.250    BNP Recent Labs  Lab 05/16/24 1017  PROBNP 271.0    DDimer No results for input(s): DDIMER in the last 168 hours.  Radiology/Studies:  DG Chest 2 View Result Date: 05/16/2024 EXAM: 2 VIEW(S) XRAY OF THE CHEST 05/16/2024 03:36:42 AM COMPARISON: 09/26/2023 CLINICAL HISTORY: Slight hypoxia after intravenous fluids, rales in lungs. Query congestive heart failure or edema. FINDINGS: LUNGS AND PLEURA: Low lung volumes. No focal pulmonary opacity. No pleural effusion. No pneumothorax. HEART AND MEDIASTINUM: Atherosclerotic calcification of the aortic arch. No acute abnormality of the cardiac and mediastinal silhouettes. BONES AND SOFT TISSUES: Chronic left  proximal humerus deformity. Thoracic spondylosis. IMPRESSION: 1. No radiographic evidence of acute cardiopulmonary disease. Electronically signed by: Dorethia Molt Miller 05/16/2024 03:53 AM EST RP Workstation: [id number]   CT Cervical Spine Wo Contrast Result Date: 05/15/2024 EXAM: CT CERVICAL SPINE WITHOUT CONTRAST 05/15/2024 10:28:07 PM TECHNIQUE: CT of the cervical spine was performed without the administration of intravenous contrast. Multiplanar reformatted images are provided for review. Automated exposure control, iterative reconstruction, and/or weight based adjustment of the mA/kV was utilized to reduce the radiation dose to as low as reasonably achievable. COMPARISON: None available. CLINICAL HISTORY: trauma Trauma. FINDINGS: BONES AND ALIGNMENT: Ankylosis of the vertebral bodies of C5-C7. No acute fracture or traumatic malalignment. DEGENERATIVE CHANGES: Prominent anterior disc osteophyte seen at C4-C5 resulting in mild compression of the posterior hypopharynx. Advanced  degenerative disc disease C3-C4 with small posterior disc osteophyte complex resulting in moderate central canal stenosis with abutment and flattening of the thecal sac and AP diameter of the spinal canal of 6-7 mm. Similar, though milder changes are seen at C4-C5 and C5-C6 with an AP diameter of the spinal canal of 7 mm. Spinal canal is otherwise widely patent. Uncovertebral arthrosis results in moderate right and severe left neural foraminal narrowing at C3-C4. Remaining neuroforamina are widely patent. SOFT TISSUES: No prevertebral soft tissue swelling. IMPRESSION: 1. No acute findings. 2. Advanced degenerative disc disease at C3-4 with moderate central canal stenosis (AP diameter 6-7 mm) and moderate right/severe left neural foraminal narrowing. 3. Prominent anterior disc osteophyte at C4-5 resulting in mild compression of the posterior hypopharynx. 4. 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 Electronically signed by: Dorethia Molt Miller 05/15/2024 10:47 PM EST RP Workstation: [id number]   CT Lumbar Spine Wo Contrast Result Date: 05/15/2024 EXAM: CT OF THE LUMBAR SPINE WITHOUT CONTRAST 05/15/2024 10:28:07 PM TECHNIQUE: CT of the lumbar spine was performed without the administration of intravenous contrast. Multiplanar reformatted images are provided for review. Automated exposure control, iterative reconstruction, and/or weight based adjustment of the mA/kV was utilized to reduce the radiation dose to as low as reasonably achievable. COMPARISON: None available. CLINICAL HISTORY: trauma Trauma. FINDINGS: BONES AND ALIGNMENT: Normal lumbar lordosis. No acute fracture or listhesis of the lumbar spine. Vertebral body height is preserved. Normal alignment. DEGENERATIVE CHANGES: Disc space narrowing is seen throughout the visualized thoracolumbar spine in keeping with changes of mild-to-moderate degenerative disc disease, most severe at T10-T12. Bridging anterior disc osteophytes are seen at T10-T11 and L4-S1.  There is degenerative ankylosis of the facet joints bilaterally at T11-L3. Advanced facet arthrosis bilaterally at L3-L4 with more moderate degenerative changes noted at L4-L5 and L5-S1. L3-L4: Broad-based disc bulge in combination with hypertrophic facet arthrosis results in moderate bilateral neural foraminal narrowing and severe central canal stenosis. L4-L5: Similar changes at L4-L5 result in mild left neural foraminal narrowing and severe central canal stenosis. SOFT TISSUES: No paraspinal fluid collection or inflammatory change. INCIDENTAL ABDOMINAL FINDINGS: Cholelithiasis. 5 mm punctate cortical calcification within the right kidney, nonspecific. Advanced arteriosclerosis. IMPRESSION: 1. No acute fracture or listhesis of the lumbar spine. 2. Severe central canal stenosis at L3-4 and L4-5, with moderate bilateral neural foraminal narrowing at L3-4 and mild left neural foraminal narrowing at L4-5. 3. Chronic thoracolumbar degenerative disc disease with bridging anterior osteophytes, degenerative facet ankylosis at T11-L3, and advanced facet arthrosis at L3-4. 4. Cholelithiasis 5. Peripheral vascular disease Electronically signed by: Dorethia Molt Miller 05/15/2024 10:41 PM EST RP Workstation: [id number]   CT HEAD WO CONTRAST ( ) Result Date: 05/15/2024 EXAM: CT HEAD WITHOUT  CONTRAST 05/15/2024 10:28:07 PM TECHNIQUE: CT of the head was performed without the administration of intravenous contrast. Automated exposure control, iterative reconstruction, and/or weight based adjustment of the mA/kV was utilized to reduce the radiation dose to as low as reasonably achievable. COMPARISON: 03/09/2024 CLINICAL HISTORY: Trauma. FINDINGS: BRAIN AND VENTRICLES: No acute hemorrhage. No evidence of acute infarct. Global parenchymal volume loss, commensurate with the patient's age. Periventricular white matter decreased attenuation consistent with small vessel ischemic changes. 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 No hydrocephalus. No extra-axial collection. No mass effect or midline shift. ORBITS: Bilateral lens replacement. Chronic right medial orbital wall fracture. SINUSES: No acute abnormality. SOFT TISSUES AND SKULL: Right frontal parietal craniotomy noted. No acute soft tissue abnormality. No skull fracture. VASCULATURE: Moderate calcific atheromatous disease within the carotid siphons. IMPRESSION: 1. No acute intracranial abnormality. 2. Chronic right pontine, right thalamic, and left basal ganglia lacunar infarcts. 3. Global parenchymal volume loss. Periventricular white matter hypoattenuation consistent with chronic small vessel ischemic change. 4. Right frontoparietal craniotomy. Electronically signed by: Dorethia Molt Miller 05/15/2024 10:34 PM EST RP Workstation: [id number]     Assessment and Plan: Persistent atrial fibrillation/flutter -Status post brief successful DCCV with recurrent atrial flutter -Continue on apixaban  5 mg twice daily as she does not meet reduced dosing criteria for CHA2DS2-VASc score of at least 4 for stroke prophylaxis -Continued on metoprolol  to tartrate 25 mg twice daily with uptitration for rate control if needed -Patient previously been evaluated by EP with limited options of medical management and not considered a candidate for invasive procedures -TSH pending -Continue on telemetry monitoring  Mechanical falls -With fall prior to arrival -CT of the head negative for any intracranial abnormalities as she is on blood thinning medication -Continues to complain of back pain but further chart review reveals previous compression fracture of the T11 vertebrae that had previously been followed by neurosurgery, CT lumbar spine completed revealed no acute fractures but severe central canal stenosis - Will ongoing management per IM  Hypertension -Blood pressure 127/74 -Continue spironolactone , metoprolol  tartrate -Vital signs per unit  protocol  AKI on CKD stage III-IV -Serum creatinine 1.79 - Baseline serum creatinine 1.1-1.3 -Monitor urine output -Daily BMP -Monitor/trend/replete electrolytes as needed -With elevated serum creatinine, normal proBNP and a clear chest x-ray recommend discontinuing furosemide   Cirrhosis with hepatic lesions -Remains on spironolactone  (noncardiac use) - Daily BMP with elevated kidney function with close monitoring of potassium  5.   Type 2 diabetes - A1c pending -Continued on insulin  therapy -management per IM   Risk Assessment/Risk Scores:         CHA2DS2-VASc Score = 4   This indicates a 4.8% annual risk of stroke. The patient's score is based upon: CHF History: 0 HTN History: 1 Diabetes History: 1 Stroke History: 0 Vascular Disease History: 0 Age Score: 1 Gender Score: 1        For questions or updates, please contact  [facility] Please consult [email or web address] for contact info under      Signed, Anushree Dorsi, NP  05/16/2024 12:31 PM     [1]  Allergies Allergen Reactions   Aspirin Other (See Comments)    Nose bleed   "

## 2024-05-16 NOTE — ED Notes (Signed)
 Able to get in contact with lab. Per [facility] they do not have a phlebotomist available and to see if someone else within the dept can try due to their short staffing.

## 2024-05-16 NOTE — ED Notes (Signed)
 Pt brief changed at this time. Peri-care preformed. CCMD informed of movement to the floor.

## 2024-05-16 NOTE — ED Notes (Addendum)
 Attempted to get blood work at this time. Unable to pull from IV. Attempted to straight stick pt with no success. Called lab at this time for phlebotomist, unable to get a hold of lab. Covid swab sent.

## 2024-05-16 NOTE — ED Provider Notes (Signed)
 Clinical Course as of 05/16/24 0728  Tue May 15, 2024  2303 CTH: IMPRESSION: 1. No acute intracranial abnormality. 2. Chronic right pontine, right thalamic, and left basal ganglia lacunar infarcts. 3. Global parenchymal volume loss. Periventricular white matter hypoattenuation consistent with chronic small vessel ischemic change. 4. Right frontoparietal craniotomy.   [MM]  2303 CTLspine: IMPRESSION: 1. No acute fracture or listhesis of the lumbar spine. 2. Severe central canal stenosis at L3-4 and L4-5, with moderate bilateral neural foraminal narrowing at L3-4 and mild left neural foraminal narrowing at L4-5. 3. Chronic thoracolumbar degenerative disc disease with bridging anterior osteophytes, degenerative facet ankylosis at T11-L3, and advanced facet arthrosis at L3-4. 4. Cholelithiasis 5. Peripheral vascular disease   [MM]  2303 CTCSpine: IMPRESSION: 1. No acute findings. 2. Advanced degenerative disc disease at C3-4 with moderate central canal stenosis (AP diameter 6-7 mm) and moderate right/severe left neural foraminal narrowing. 3. Prominent anterior disc osteophyte at C4-5 resulting in mild compression of the posterior hypopharynx. 4. 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   [MM]  Wed May 16, 2024  0617 Patient alert, SP02 86-89% RA.  [MQ]  9345 Noted to Dr. Lawence: Started with a fall.  Noted to have AKI and hyponatremia.  Prior provider had encouraged patient to be admitted but at that point she wanted to try to go home.  Hyponatremia.  Noted to have some peripheral edema that she reports has worsened over the last week.  In addition she has acute on chronic kidney injury.  Also noted that after receiving a small 500 mL fluid bolus that she has slight Rales and she reports she has been having a cough for several days.  Mild oxygen requirement currently on 2 L nasal cannula.  No distress otherwise seems to be doing well but new oxygen requirement slight rales on  exam.  She does not look well generally fatigued, chest x-ray clear.  I am questioning if she may have slight element of volume overload and if this could in fact be the cause of her hyponatremia.  Additionally, awaiting influenza testing. [MQ]  9345 Patient agreeable with plan for admission [MQ]  0702 Admitted, accepted to Dr. Eldonna [MQ]    Clinical Course User Index [MM] Clarine Ozell LABOR, MD [MQ] Dicky Anes, MD      Dicky Anes, MD 05/16/24 [phone]

## 2024-05-16 NOTE — ED Provider Notes (Signed)
 Vitals:   05/15/24 2216  Temp: 98.1 F (36.7 C)     Examination.  The patient is reporting that she feels better.  She does not seem to have evidence of significant AKI.  Additionally she has a slight cough and congestion and has Rales on examination in the bases of both lungs at this time.  She denies any recent fevers chills or obvious illness though.  Urinalysis pending.  Oxygen saturation 88 to 91% while resting but when she wakes up at 95% on room air.  Plan to obtain chest x-ray to evaluate for pneumonia, abnormal pulmonary etiology or pulmonary edema post IV fluids.     Dicky Anes, MD 05/16/24 [phone]

## 2024-05-17 ENCOUNTER — Observation Stay (HOSPITAL_BASED_OUTPATIENT_CLINIC_OR_DEPARTMENT_OTHER)
Admit: 2024-05-17 | Discharge: 2024-05-17 | Disposition: A | Discharge status: Home / Self Care | Attending: Cardiovascular Disease | Admitting: Cardiovascular Disease

## 2024-05-17 DIAGNOSIS — Z8739 Personal history of other diseases of the musculoskeletal system and connective tissue: Secondary | ICD-10-CM

## 2024-05-17 DIAGNOSIS — I4891 Unspecified atrial fibrillation: Secondary | ICD-10-CM

## 2024-05-17 DIAGNOSIS — C220 Liver cell carcinoma: Secondary | ICD-10-CM | POA: Diagnosis not present

## 2024-05-17 DIAGNOSIS — N189 Chronic kidney disease, unspecified: Secondary | ICD-10-CM

## 2024-05-17 DIAGNOSIS — K7469 Other cirrhosis of liver: Secondary | ICD-10-CM | POA: Diagnosis not present

## 2024-05-17 DIAGNOSIS — I5031 Acute diastolic (congestive) heart failure: Secondary | ICD-10-CM | POA: Diagnosis not present

## 2024-05-17 DIAGNOSIS — F3289 Other specified depressive episodes: Secondary | ICD-10-CM | POA: Diagnosis not present

## 2024-05-17 DIAGNOSIS — E1129 Type 2 diabetes mellitus with other diabetic kidney complication: Secondary | ICD-10-CM

## 2024-05-17 DIAGNOSIS — N179 Acute kidney failure, unspecified: Secondary | ICD-10-CM | POA: Diagnosis not present

## 2024-05-17 DIAGNOSIS — I5033 Acute on chronic diastolic (congestive) heart failure: Secondary | ICD-10-CM | POA: Diagnosis not present

## 2024-05-17 DIAGNOSIS — E871 Hypo-osmolality and hyponatremia: Secondary | ICD-10-CM | POA: Diagnosis not present

## 2024-05-17 DIAGNOSIS — W19XXXS Unspecified fall, sequela: Secondary | ICD-10-CM | POA: Diagnosis not present

## 2024-05-17 LAB — BASIC METABOLIC PANEL WITH GFR
Anion gap: 10 (ref 5–15)
BUN: 36 mg/dL — ABNORMAL HIGH (ref 8–23)
CO2: 25 mmol/L (ref 22–32)
Calcium: 8.3 mg/dL — ABNORMAL LOW (ref 8.9–10.3)
Chloride: 102 mmol/L (ref 98–111)
Creatinine, Ser: 1.65 mg/dL — ABNORMAL HIGH (ref 0.44–1.00)
GFR, Estimated: 33 mL/min — ABNORMAL LOW
Glucose, Bld: 153 mg/dL — ABNORMAL HIGH (ref 70–99)
Potassium: 4.1 mmol/L (ref 3.5–5.1)
Sodium: 136 mmol/L (ref 135–145)

## 2024-05-17 LAB — ECHOCARDIOGRAM COMPLETE
AR max vel: 2.72 cm2
AV Area VTI: 2.85 cm2
AV Area mean vel: 2.77 cm2
AV Mean grad: 1 mmHg
AV Peak grad: 2.1 mmHg
Ao pk vel: 0.72 m/s
Area-P 1/2: 4.74 cm2
Height: 66 in
MV VTI: 1.83 cm2
S' Lateral: 1.34 cm
Weight: 2712.54 [oz_av]

## 2024-05-17 LAB — CBC
HCT: 35.5 % — ABNORMAL LOW (ref 36.0–46.0)
Hemoglobin: 11.4 g/dL — ABNORMAL LOW (ref 12.0–15.0)
MCH: 31.2 pg (ref 26.0–34.0)
MCHC: 32.1 g/dL (ref 30.0–36.0)
MCV: 97.3 fL (ref 80.0–100.0)
Platelets: 121 10*3/uL — ABNORMAL LOW (ref 150–400)
RBC: 3.65 MIL/uL — ABNORMAL LOW (ref 3.87–5.11)
RDW: 13.6 % (ref 11.5–15.5)
WBC: 5.7 10*3/uL (ref 4.0–10.5)
nRBC: 0 % (ref 0.0–0.2)

## 2024-05-17 LAB — GLUCOSE, CAPILLARY
Glucose-Capillary: 372 mg/dL — ABNORMAL HIGH (ref 70–99)
Glucose-Capillary: 229 mg/dL — ABNORMAL HIGH (ref 70–99)
Glucose-Capillary: 168 mg/dL — ABNORMAL HIGH (ref 70–99)
Glucose-Capillary: 200 mg/dL — ABNORMAL HIGH (ref 70–99)

## 2024-05-17 LAB — MAGNESIUM: Magnesium: 1.8 mg/dL (ref 1.7–2.4)

## 2024-05-17 MED ORDER — LACTULOSE 10 GM/15ML PO SOLN
30.0000 g | Freq: Every day | ORAL | Status: DC | PRN
  Administered 2024-05-17: 30 g via ORAL
  Filled 2024-05-17: qty 60

## 2024-05-17 MED ORDER — POLYETHYLENE GLYCOL 3350 17 G PO PACK
17.0000 g | PACK | Freq: Every day | ORAL | Status: DC
  Administered 2024-05-17: 17 g via ORAL
  Filled 2024-05-17: qty 1

## 2024-05-17 NOTE — Assessment & Plan Note (Addendum)
 AKI on CKD stage IIIa.  Creatinine 1.79 on presentation.  Hold further diuresis.  Today's creatinine 1.48.  Hold Lasix  and potassium for another 2 days and then can restart

## 2024-05-17 NOTE — Progress Notes (Signed)
*  PRELIMINARY RESULTS* Echocardiogram 2D Echocardiogram has been performed.  Floydene Harder 05/17/2024, 10:15 AM

## 2024-05-17 NOTE — TOC Initial Note (Signed)
 Transition of Care [facility]) - Initial/Assessment Note    Patient Details  Name: Adriana Miller MRN: [id number] Date of Birth: 02/14/1956  [facility]) CM/SW Contact:    [name], RN Phone Number: 05/17/2024, 1:26 PM  Clinical Narrative:     RNCM met with patient in her room.  She lives alone and has a PCA/CNA who checks in with her daily and assist with ADL's.  She also has supportive brother who lives close by who provides groceries, med pick ups.   She states she wants to go home when medically ready.  PT/OT recommends home health PT/OT, referrals sent out for homecare.  She does have PCP, does not drive.  Has all needed DME in home.  RNCM will contact PCS at time of discharge.  Will continue to follow until discharge.              Expected Discharge Plan: Home w Home Health Services Barriers to Discharge: Continued Medical Work up   Patient Goals and CMS Choice Patient states their goals for this hospitalization and ongoing recovery are:: to return home          Expected Discharge Plan and Services   Discharge Planning Services: CM Consult                               HH Arranged: PT, OT HH Agency:  (referrals sent)        Prior Living Arrangements/Services   Lives with:: Self Patient language and need for interpreter reviewed:: Yes Do you feel safe going back to the place where you live?: Yes      Need for Family Participation in Patient Care: Yes (Comment) (will transport patient home) Care giver support system in place?: Yes (comment) (brother) Current home services: Homehealth aide Criminal Activity/Legal Involvement Pertinent to Current Situation/Hospitalization: No - Comment as needed  Activities of Daily Living   ADL Screening (condition at time of admission) Independently performs ADLs?: No Does the patient have a NEW difficulty with bathing/dressing/toileting/self-feeding that is expected to last >3 days?: No Does the patient have  a NEW difficulty with getting in/out of bed, walking, or climbing stairs that is expected to last >3 days?: No Does the patient have a NEW difficulty with communication that is expected to last >3 days?: No Is the patient deaf or have difficulty hearing?: No Does the patient have difficulty seeing, even when wearing glasses/contacts?: Yes (eyeglasses (not here)) Does the patient have difficulty concentrating, remembering, or making decisions?: No  Permission Sought/Granted                  Emotional Assessment Appearance:: Appears stated age Attitude/Demeanor/Rapport: Engaged Affect (typically observed): Appropriate Orientation: : Oriented to Self, Oriented to Place, Oriented to  Time, Oriented to Situation Alcohol  / Substance Use: Never Used Psych Involvement: No (comment)  Admission diagnosis:  Hyponatremia [E87.1] AKI (acute kidney injury) [N17.9] Contusion of scalp, initial encounter [S00.03XA] Fall, initial encounter [W19.XXXA] Patient Active Problem List   Diagnosis Date Noted   Acute on chronic systolic CHF (congestive heart failure) (HCC) 05/16/2024   Acute on chronic diastolic CHF (congestive heart failure) (HCC) 05/16/2024   Contusion of scalp 05/16/2024   Hypervitaminosis D 02/16/2024   Chronic cough 02/16/2024   Overweight (BMI 25.0-29.9) 09/26/2023   Maceration of skin 09/26/2023   ETOH abuse 02/24/2023   Uncomplicated alcohol  dependence (HCC) 02/23/2023   Fall 02/23/2023  Acute on chronic anemia 02/18/2023   Comminuted left humeral fracture, closed, initial encounter 02/14/2023   Accidental fall 02/14/2023   Elevated ETOH level 02/14/2023   Uncontrolled type 2 diabetes mellitus with hyperglycemia, with long-term current use of insulin  (HCC) 02/14/2023   History of recurrent UTI (urinary tract infection) 02/14/2023   Alcohol  use disorder 02/14/2023   Chronic anticoagulation 02/14/2023   Atrial flutter (HCC) 12/27/2022   Chronic atrial fibrillation with RVR  (HCC) 11/18/2022   SIRS (systemic inflammatory response syndrome) (HCC) 11/18/2022   Stage 3b chronic kidney disease (HCC) 11/18/2022   Frailty 11/18/2022   Sepsis (HCC) 11/18/2022   Hypoglycemia 10/02/2022   Hyponatremia 10/01/2022   Hypokalemia 10/01/2022   Chronic kidney disease, stage 3a (HCC) 10/01/2022   Urinary tract infection 09/30/2022   Type 2 diabetes mellitus with other diabetic kidney complication (HCC) 09/30/2022   Depression 09/30/2022   Chronic diarrhea 09/30/2022   Hypomagnesemia 09/30/2022   Severe sepsis (HCC) 08/22/2022   AKI (acute kidney injury) 08/21/2022   Elevated LFTs 08/21/2022   Thrombocytopenia 08/21/2022   History of cerebral aneurysm repair 08/21/2022   Metabolic encephalopathy 08/21/2022   Elevated troponin 08/21/2022   Dehydration 06/18/2022   SIRS due to infectious process with acute organ dysfunction (HCC) 06/17/2022   Bacterial conjunctivitis 06/17/2022   Lactic acidosis 06/17/2022   Paroxysmal atrial fibrillation (HCC) 08/06/2021   Shaking 08/06/2021   Hepatic cirrhosis (HCC) 04/30/2020   Hepatocellular carcinoma (HCC) 01/08/2020   Left arm weakness 07/25/2019   Atrial fibrillation with RVR (HCC) 07/25/2019   Hypotension 04/20/2019   Alcoholic cirrhosis of liver without ascites (HCC)    Chronic heart failure with preserved ejection fraction (HFpEF) (HCC) 01/15/2019   Dyspnea on exertion 10/10/2018   Persistent atrial fibrillation (HCC) 10/10/2018   Diastolic dysfunction 10/10/2018   Acute upper respiratory infection 09/07/2017   Hypertension associated with type 2 diabetes mellitus (HCC) 09/07/2017   Type 2 diabetes mellitus (HCC) 04/05/2017   Weakness 04/05/2017   Hyperlipidemia associated with type 2 diabetes mellitus (HCC) 04/05/2017   Alcohol  abuse with other alcohol -induced disorder (HCC) 04/05/2017   B12 deficiency 04/05/2017   History of seizure 05/20/2015   Brain aneurysm 05/20/2015   PCP:  Kristina Tinnie POUR,  PA-C Pharmacy:   [id number] - [place], [place] - [address] [place] [address] Phone: [phone] Fax: [phone]  [facility] - [address], [place] - [address] [place] [address] Phone: [phone] Fax: [phone]  [facility] - [place], [place] - [address] [place] [address] Phone: [phone] Fax: [phone]  [facility] - [place]  - [place], [place] - [address] [place] [address] Phone: [phone] Fax: [phone]     Social Drivers of Health (SDOH) Social History: SDOH Screenings   Food Insecurity: No Food Insecurity (05/16/2024)  Housing: Low Risk (05/16/2024)  Transportation Needs: No Transportation Needs (05/16/2024)  Utilities: Not At Risk (05/16/2024)  Alcohol  Screen: Low Risk (06/06/2023)  Depression (PHQ2-9): Low Risk (03/06/2024)  Social Connections: Socially Isolated (05/16/2024)  Tobacco Use: High Risk (05/15/2024)   SDOH Interventions:     Readmission Risk Interventions    11/20/2022    2:04 PM 10/01/2022    9:15 AM  Readmission Risk Prevention Plan  Transportation Screening Complete Complete  Medication Review (RN Care Manager) Complete Complete  PCP or Specialist appointment within 3-5 days of discharge Complete  Complete  HRI or Home Care Consult Complete Complete  SW Recovery Care/Counseling Consult Complete Complete  Palliative Care Screening Not Applicable Not Applicable  Skilled Nursing Facility Not Applicable Not Applicable

## 2024-05-17 NOTE — Evaluation (Signed)
 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    Clinical Impressions Pt was seen for OT evaluation this date. Prior to hospital admission, pt was living alone, using SPC primarily, and getting assist in the mornings for breakfast and bathing in shower from PCA who comes daily for an hour. Brother assists for ~2hrs daily with groceries, trash. She has medical transport for appointments. Pt endorses she sometimes forgets to take her medications.  Pt presents with deficits in strength, low back pain, balance, and activity tolerance limiting their ability to perform ADL management at baseline level. Pt currently requires MIN-MOD A for sup>sit EOB (first time up), CGA for ADL transfers with RW, CGA for in-home distance mobility with RW, and MIN-MOD A for LB ADL tasks. Pt able to complete pericare after toileting with CGA in standing with LUE support on RW. Maintained SpO2 >95% on RA throughout. HR briefly up to low 130's with exertion. Pt would benefit from skilled OT services to address noted impairments and functional limitations (see below for any additional details) in order to maximize safety and independence while minimizing future risk of falls, injury, and readmission. Anticipate the need for follow up OT services upon acute hospital DC.    If plan is discharge home, recommend the following:   A little help with walking and/or transfers;A lot of help with bathing/dressing/bathroom;Assistance with cooking/housework;Assist for transportation;Help with stairs  or ramp for entrance;Direct supervision/assist for medications management     Functional Status Assessment   Patient has had a recent decline in their functional status and demonstrates the ability to make significant improvements in function in a reasonable and predictable amount of time.     Equipment Recommendations   None recommended by OT     Recommendations for Other Services         Precautions/Restrictions   Precautions Precautions: Fall Recall of Precautions/Restrictions: Intact Restrictions Weight Bearing Restrictions Per Provider Order: No     Mobility Bed Mobility Overal bed mobility: Needs Assistance Bed Mobility: Supine to Sit     Supine to sit: Min assist, Mod assist, HOB elevated, Used rails     General bed mobility comments: increased time/effort    Transfers Overall transfer level: Needs assistance Equipment used: Rolling walker (2 wheels) Transfers: Sit to/from Stand Sit to Stand: Contact guard assist, +2 safety/equipment, From elevated surface                  Balance Overall balance assessment: Needs assistance Sitting-balance support: Single extremity supported, No upper extremity supported, Feet supported Sitting balance-Leahy Scale: Fair     Standing balance support: During functional activity, Reliant on assistive device for balance, Bilateral upper extremity supported, Single extremity supported Standing balance-Leahy Scale: Poor Standing balance comment: able to complete pericare with LUE on RW in standing but did require UE support          ADL either performed or assessed with clinical judgement   ADL Overall ADL's : Needs assistance/impaired                     Lower Body Dressing: Sit to/from  stand;Moderate assistance   Toilet Transfer: Contact guard assist;Rolling walker (2 wheels);BSC/3in1;Regular Teacher, Adult Education Details (indicate cue type and reason): BSC over toilet Toileting- Clothing  Manipulation and Hygiene: Contact guard assist;Sit to/from stand       Functional mobility during ADLs: Contact guard assist;Rolling walker (2 wheels)        Pertinent Vitals/Pain Pain Assessment Pain Assessment: 0-10 Pain Score: 4  Pain Location: back Pain Descriptors / Indicators: Sore, Aching Pain Intervention(s): Monitored during session, Repositioned     Extremity/Trunk Assessment Upper Extremity Assessment Upper Extremity Assessment: Generalized weakness   Lower Extremity Assessment Lower Extremity Assessment: Generalized weakness;Defer to PT evaluation       Communication Communication Communication: No apparent difficulties   Cognition Arousal: Alert Behavior During Therapy: WFL for tasks assessed/performed Cognition: No apparent impairments          Following commands: Intact       Cueing  General Comments   Cueing Techniques: Verbal cues  SpO2 98% on 2L. O2 removed and pt able to maintain >95% throughout activity. RN notified.   Exercises     Shoulder Instructions      Home Living Family/patient expects to be discharged to:: Private residence Living Arrangements: Alone Available Help at Discharge: Personal care attendant;Available PRN/intermittently Type of Home: Apartment Home Access: Level entry     Home Layout: One level     Bathroom Shower/Tub: Chief Strategy Officer: Handicapped height Bathroom Accessibility: No   Home Equipment: Shower seat;Grab bars - toilet;Cane - single point;Rollator (4 wheels);Grab bars - tub/shower;Hospital bed;Rolling Walker (2 wheels) (fall alert)          Prior Functioning/Environment Prior Level of Function : Needs assist;History of Falls (last six months)       Physical Assist : Mobility (physical);ADLs (physical)     Mobility Comments: Pt reports using SPC or rollator for mobility (usually SPC) ADLs Comments: per pt report, home health aide assists with bathing, cleaning, dressing  (especially putting on her socks), does use pull ups, and food prep; and is not driving. Aide comes 7x/wk for 1-2 hours, stated her brother usually comes daily as well. calls transportation for rides, brother assists with trash, grocery shopping, med runs. she manages her medications    OT Problem List: Decreased strength;Pain;Decreased activity tolerance;Impaired balance (sitting and/or standing);Decreased knowledge of use of DME or AE   OT Treatment/Interventions: Self-care/ADL training;Therapeutic exercise;Therapeutic activities;Energy conservation;DME and/or AE instruction;Patient/family education;Balance training      OT Goals(Current goals can be found in the care plan section)   Acute Rehab OT Goals Patient Stated Goal: go home OT Goal Formulation: With patient Time For Goal Achievement: 05/31/24 Potential to Achieve Goals: Good ADL Goals Pt Will Perform Lower Body Dressing: sit to/from stand;with modified independence Pt Will Transfer to Toilet: with modified independence;ambulating (LRAD) Pt Will Perform Toileting - Clothing Manipulation and hygiene: with modified independence;sit to/from stand;sitting/lateral leans Additional ADL Goal #1: Pt will verbalize plan to implement at home to improve daily adherence to medication regimen.   OT Frequency:  Min 2X/week    Co-evaluation PT/OT/SLP Co-Evaluation/Treatment: Yes Reason for Co-Treatment: To address functional/ADL transfers PT goals addressed during session: Mobility/safety with mobility;Balance;Proper use of DME OT goals addressed during session: ADL's and self-care;Proper use of Adaptive equipment and DME      AM-PAC OT 6 Clicks Daily Activity     Outcome Measure Help from another person eating meals?: None Help from another person taking care of personal grooming?: A Little  Help from another person toileting, which includes using toliet, bedpan, or urinal?: A Little Help from another person bathing (including  washing, rinsing, drying)?: A Lot Help from another person to put on and taking off regular upper body clothing?: A Little Help from another person to put on and taking off regular lower body clothing?: A Lot 6 Click Score: 17   End of Session Equipment Utilized During Treatment: Gait belt;Rolling walker (2 wheels) Nurse Communication: Mobility status  Activity Tolerance: Patient tolerated treatment well Patient left: in chair;with call bell/phone within reach;with chair alarm set  OT Visit Diagnosis: Unsteadiness on feet (R26.81);Muscle weakness (generalized) (M62.81)                Time: 9088-9068 OT Time Calculation (min): 20 min Charges:  OT General Charges $OT Visit: 1 Visit OT Evaluation $OT Eval Low Complexity: 1 Low  Warren SAUNDERS., MPH, MS, OTR/L ascom [phone] 05/17/24, 10:46 AM

## 2024-05-17 NOTE — Assessment & Plan Note (Signed)
 On allopurinol

## 2024-05-17 NOTE — Care Management Obs Status (Signed)
 MEDICARE OBSERVATION STATUS NOTIFICATION   Patient Details  Name: Adriana Miller MRN: [id number] Date of Birth: 09-17-1955   Medicare Observation Status Notification Given:  Yes    [place] Reason 05/17/2024, 1:53 PM

## 2024-05-17 NOTE — Assessment & Plan Note (Signed)
PT recommending home health.

## 2024-05-17 NOTE — Hospital Course (Addendum)
 69 y.o. 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  Patient stated that she was trying to reach down to pick up a pillow then she fell down injuring her back and head.  Patient complains of bump on her back of the head.  She denied any losing consciousness.  Patient complains of mild back pain otherwise no other symptoms.  She denied any fever chills nausea vomiting, chest pain, palpitations, fever, dysuria or hematuria.  However, she complains of shortness of breath on exertion.  She also complains some orthopnea but no PND.   ED Course: Upon arrival to the ED, patient is found to be hypoxic requiring oxygen.  Her CT head was negative for acute pathology, CT lumbar spine did not show any acute fractures but she had a severe central canal stenosis between L3-L4 and L4-L5.  CT C-spine showed no acute finding but she has degenerative disc disease.  Chest x-ray did not show any acute pathology.  Her sodium was 131, creatinine was 1.79 slightly up from her baseline 1.2 to, BNP was 271.  There was concern for new hypoxemia, AKI and bilateral lower extremity swelling.  Hospitalist service was consulted for evaluation for admission for acute exacerbation of congestive heart failure.  1/29.  Case discussed with cardiology and will hold Lasix .  PT recommending home with home health.  Echocardiogram pending. 1/30.  Patient discharged home.  Prior to being discharged home heart rate went up to 128 and we gave an extra dose of metoprolol  and increased metoprolol  to 37.5 mg twice a day upon going home.  Heart rate improved upon discharge.

## 2024-05-17 NOTE — Assessment & Plan Note (Signed)
 Outpatient follow up

## 2024-05-17 NOTE — Evaluation (Signed)
 Physical Therapy Evaluation Patient Details Name: Adriana Miller MRN: [id number] DOB: 23-Jun-1955 Today's Date: 05/17/2024  History of Present Illness  69 year old woman with history of atrial fibrillation/flutter on Eliquis  (likely now permanent), type 2 diabetes, chronic kidney disease, history of GI bleed secondary to erosive gastropathy 2020, Pado cellular carcinoma, seizure disorder, cerebral aneurysm status post repair presenting to the hospital after a fall and hit her head.  Clinical Impression  Patient A&Ox4, reported at baseline she lives alone, her brother assists with IADLs, checks on her daily. Also has and aide that checks in daily to assist with ADLs/IADLs as needed. Pt is normally ambulatory with SPC, denied any further falls.   She required minA to sit EOB due to back pain (acute since fall). Sit <> stand from EOB and from elevated commode, CGA with RW. She ambulated ~4ft with RW/CGA, some fatigue noted with activity but no physical assistance needed. Pt reported improvement in pain after mobility.  Overall the patient demonstrated deficits (see PT Problem List) that impede the patient's functional abilities, safety, and mobility and would benefit from skilled PT intervention.          If plan is discharge home, recommend the following: Assistance with cooking/housework;Assist for transportation;Help with stairs or ramp for entrance   Can travel by private vehicle        Equipment Recommendations None recommended by PT  Recommendations for Other Services       Functional Status Assessment Patient has had a recent decline in their functional status and demonstrates the ability to make significant improvements in function in a reasonable and predictable amount of time.     Precautions / Restrictions Precautions Precautions: Fall Recall of Precautions/Restrictions: Intact Restrictions Weight Bearing Restrictions Per Provider Order: No      Mobility  Bed  Mobility Overal bed mobility: Needs Assistance Bed Mobility: Supine to Sit     Supine to sit: Min assist, Mod assist, HOB elevated, Used rails     General bed mobility comments: increased time/effort    Transfers Overall transfer level: Needs assistance Equipment used: Rolling walker (2 wheels) Transfers: Sit to/from Stand Sit to Stand: Contact guard assist, +2 safety/equipment, From elevated surface                Ambulation/Gait Ambulation/Gait assistance: Contact guard assist Gait Distance (Feet): 65 Feet Assistive device: Rolling walker (2 wheels)   Gait velocity: decreased        Stairs            Wheelchair Mobility     Tilt Bed    Modified Rankin (Stroke Patients Only)       Balance Overall balance assessment: Needs assistance Sitting-balance support: Single extremity supported, No upper extremity supported, Feet supported Sitting balance-Leahy Scale: Fair     Standing balance support: During functional activity, Reliant on assistive device for balance, Bilateral upper extremity supported, Single extremity supported Standing balance-Leahy Scale: Poor Standing balance comment: able to complete pericare with LUE on RW in standing but did require UE support                             Pertinent Vitals/Pain Pain Assessment Pain Assessment: 0-10 Pain Score: 4  Pain Location: back Pain Descriptors / Indicators: Sore, Aching Pain Intervention(s): Limited activity within patient's tolerance, Monitored during session, Repositioned    Home Living Family/patient expects to be discharged to:: Private residence Living Arrangements: Alone Available Help at Discharge:  Personal care attendant;Available PRN/intermittently Type of Home: Apartment Home Access: Level entry       Home Layout: One level Home Equipment: Shower seat;Grab bars - toilet;Cane - single point;Rollator (4 wheels);Grab bars - tub/shower;Hospital bed;Rolling Walker  (2 wheels) (fall alert)      Prior Function Prior Level of Function : Needs assist;History of Falls (last six months)       Physical Assist : Mobility (physical);ADLs (physical)     Mobility Comments: Pt reports using SPC or rollator for mobility (usually SPC) ADLs Comments: per pt report, home health aide assists with bathing, cleaning, dressing (especially putting on her socks), does use pull ups, and food prep; and is not driving. Aide comes 7x/wk for 1-2 hours, stated her brother usually comes daily as well. calls transportation for rides, brother assists with trash, grocery shopping, med runs. she manages her medications     Extremity/Trunk Assessment   Upper Extremity Assessment Upper Extremity Assessment: Generalized weakness    Lower Extremity Assessment Lower Extremity Assessment: Generalized weakness (able to lift BLE against gravity)       Communication   Communication Communication: No apparent difficulties    Cognition Arousal: Alert Behavior During Therapy: WFL for tasks assessed/performed   PT - Cognitive impairments: No apparent impairments                         Following commands: Intact       Cueing Cueing Techniques: Verbal cues     General Comments General comments (skin integrity, edema, etc.): SpO2 98% on 2L. O2 removed and pt able to maintain >95% throughout activity. RN notified.    Exercises     Assessment/Plan    PT Assessment Patient needs continued PT services  PT Problem List Decreased strength;Decreased balance;Decreased range of motion;Decreased mobility;Decreased activity tolerance       PT Treatment Interventions DME instruction;Therapeutic activities;Gait training;Patient/family education;Therapeutic exercise;Stair training;Balance training;Functional mobility training;Neuromuscular re-education    PT Goals (Current goals can be found in the Care Plan section)  Acute Rehab PT Goals Patient Stated Goal: to go  home PT Goal Formulation: With patient Time For Goal Achievement: 05/31/24 Potential to Achieve Goals: Good    Frequency Min 2X/week     Co-evaluation   Reason for Co-Treatment: To address functional/ADL transfers PT goals addressed during session: Mobility/safety with mobility;Balance;Proper use of DME OT goals addressed during session: ADL's and self-care;Proper use of Adaptive equipment and DME       AM-PAC PT 6 Clicks Mobility  Outcome Measure Help needed turning from your back to your side while in a flat bed without using bedrails?: A Little Help needed moving from lying on your back to sitting on the side of a flat bed without using bedrails?: A Little Help needed moving to and from a bed to a chair (including a wheelchair)?: A Little Help needed standing up from a chair using your arms (e.g., wheelchair or bedside chair)?: A Little Help needed to walk in hospital room?: A Little Help needed climbing 3-5 steps with a railing? : A Little 6 Click Score: 18    End of Session Equipment Utilized During Treatment: Gait belt Activity Tolerance: Patient tolerated treatment well Patient left: with call bell/phone within reach;with chair alarm set;in chair Nurse Communication: Mobility status PT Visit Diagnosis: Muscle weakness (generalized) (M62.81);Other abnormalities of gait and mobility (R26.89)    Time: 9088-9068 PT Time Calculation (min) (ACUTE ONLY): 20 min   Charges:  PT Evaluation $PT Eval Low Complexity: 1 Low   PT General Charges $$ ACUTE PT VISIT: 1 Visit         Doyal Shams PT, DPT 11:25 AM,05/17/24

## 2024-05-17 NOTE — Assessment & Plan Note (Addendum)
 Continue Lantus  insulin  and sliding scale while here and back on Toujeo  insulin  as outpatient

## 2024-05-17 NOTE — Assessment & Plan Note (Addendum)
 Continue spironolactone .  Hold Lasix  today.

## 2024-05-17 NOTE — Assessment & Plan Note (Signed)
 Last sodium normal range

## 2024-05-17 NOTE — Plan of Care (Signed)

## 2024-05-17 NOTE — Assessment & Plan Note (Addendum)
 Ruled out with normal BNP.  Hold Lasix  today.

## 2024-05-17 NOTE — Assessment & Plan Note (Addendum)
 Have a heart rate of 145 on presentation.  Heart rate improved on metoprolol  25 mg twice daily.  Prior to disposition heart rate went up to 120 and needed to give an extra dose of metoprolol  we will increase metoprolol  to 37.5 mg twice a day.  Patient on Eliquis  for anticoagulation.

## 2024-05-17 NOTE — Progress Notes (Signed)
 " Progress Note   Patient: Adriana Miller FMW:[id number] DOB: [birth year] DOA: 05/15/2024     0 DOS: the patient was seen and examined on 05/17/2024   Brief hospital course: 69 y.o. 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  Patient stated that she was trying to reach down to pick up a pillow then she fell down injuring her back and head.  Patient complains of bump on her back of the head.  She denied any losing consciousness.  Patient complains of mild back pain otherwise no other symptoms.  She denied any fever chills nausea vomiting, chest pain, palpitations, fever, dysuria or hematuria.  However, she complains of shortness of breath on exertion.  She also complains some orthopnea but no PND.   ED Course: Upon arrival to the ED, patient is found to be hypoxic requiring oxygen.  Her CT head was negative for acute pathology, CT lumbar spine did not show any acute fractures but she had a severe central canal stenosis between L3-L4 and L4-L5.  CT C-spine showed no acute finding but she has degenerative disc disease.  Chest x-ray did not show any acute pathology.  Her sodium was 131, creatinine was 1.79 slightly up from her baseline 1.2 to, BNP was 271.  There was concern for new hypoxemia, AKI and bilateral lower extremity swelling.  Hospitalist service was consulted for evaluation for admission for acute exacerbation of congestive heart failure.  1/29.  Case discussed with cardiology and will hold Lasix .  PT recommending home with home health.  Echocardiogram pending.  Assessment and Plan: * Atrial fibrillation with RVR (HCC) Have a heart rate of 145 on presentation.  Heart rate improved on metoprolol  25 mg twice daily.  Also on Eliquis   for anticoagulation.  Acute kidney injury superimposed on CKD AKI on CKD stage IIIa.  Creatinine 1.79 on presentation.  Hold further diuresis.  Today's creatinine 1.65.  Check BMP tomorrow.  Type 2 diabetes mellitus with other diabetic kidney complication (HCC) Continue Lantus  insulin  and sliding scale.  Accidental fall PT recommending home health  Hepatocellular carcinoma [facility]) Outpatient follow-up  Depression On Lexapro   Acute on chronic diastolic CHF (congestive heart failure) (HCC) Ruled out with normal BNP.  Hold Lasix .  History of gout On allopurinol   Hyponatremia Last sodium normal range  Cirrhosis of liver (HCC) Continue spironolactone .  Hold Lasix .        Subjective: Patient had a fall at home.  Has some back pain.  Initially admitted with possible CHF exacerbation.  Physical Exam: Vitals:   05/17/24 0430 05/17/24 0532 05/17/24 0845 05/17/24 1217  BP: (!) 90/57  96/61 102/61  Pulse: 70  79 (!) 50  Resp: 20     Temp: (!) 97.4 F (36.3 C)  99.1 F (37.3 C) 98.3 F (36.8 C)  TempSrc:    Oral  SpO2: 100%  100% 98%  Weight:  76.9 kg    Height:       Physical Exam HENT:     Head: Normocephalic.  Eyes:     General: Lids are normal.     Conjunctiva/sclera: Conjunctivae normal.  Cardiovascular:     Rate and Rhythm: Normal rate. Rhythm irregularly irregular.     Heart sounds: Normal heart sounds, S1 normal and S2 normal.  Pulmonary:  Breath sounds: Examination of the right-lower field reveals decreased breath sounds. Examination of the left-lower field reveals decreased breath sounds. Decreased breath sounds present. No wheezing, rhonchi or rales.  Abdominal:     Palpations: Abdomen is soft.     Tenderness: There is no abdominal tenderness.  Musculoskeletal:     Right lower leg: Swelling present.     Left lower leg: Swelling present.  Skin:    General: Skin is warm.     Findings: No rash.  Neurological:     Mental Status: She is alert.      Data Reviewed: proBNP 271 Creatinine 1.65, electrolytes normal range, glucose 153, magnesium  1.8, GFR 33 White blood cell count 5.7, hemoglobin 11.4, platelet count 121 Family Communication: Able to reach brother this afternoon.  Disposition: Status is: Observation I do not think this is CHF exacerbation she may be a little dehydrated.  Hold on further Lasix .  Recheck creatinine tomorrow  Planned Discharge Destination: Home with Home Health    Time spent: 28 minutes  Author: Charlie Patterson, MD 05/17/2024 2:41 PM  For on call review [email or web address].  "

## 2024-05-17 NOTE — Progress Notes (Signed)
 Pt hasn't had a documented bowl movement since 05/14/2024.  It has been over 48 hours since pt had a bowel movement.  Pt needs a BM regimen, whether scheduled or PRN.

## 2024-05-17 NOTE — Assessment & Plan Note (Signed)
-   On Lexapro

## 2024-05-18 ENCOUNTER — Other Ambulatory Visit: Payer: Self-pay

## 2024-05-18 DIAGNOSIS — I5033 Acute on chronic diastolic (congestive) heart failure: Secondary | ICD-10-CM

## 2024-05-18 DIAGNOSIS — E1129 Type 2 diabetes mellitus with other diabetic kidney complication: Secondary | ICD-10-CM | POA: Diagnosis not present

## 2024-05-18 DIAGNOSIS — W19XXXS Unspecified fall, sequela: Secondary | ICD-10-CM | POA: Diagnosis not present

## 2024-05-18 DIAGNOSIS — N179 Acute kidney failure, unspecified: Secondary | ICD-10-CM | POA: Diagnosis not present

## 2024-05-18 DIAGNOSIS — I4891 Unspecified atrial fibrillation: Secondary | ICD-10-CM | POA: Diagnosis not present

## 2024-05-18 LAB — BASIC METABOLIC PANEL WITH GFR
Anion gap: 7 (ref 5–15)
BUN: 36 mg/dL — ABNORMAL HIGH (ref 8–23)
CO2: 28 mmol/L (ref 22–32)
Calcium: 8.5 mg/dL — ABNORMAL LOW (ref 8.9–10.3)
Chloride: 101 mmol/L (ref 98–111)
Creatinine, Ser: 1.48 mg/dL — ABNORMAL HIGH (ref 0.44–1.00)
GFR, Estimated: 38 mL/min — ABNORMAL LOW
Glucose, Bld: 175 mg/dL — ABNORMAL HIGH (ref 70–99)
Potassium: 3.7 mmol/L (ref 3.5–5.1)
Sodium: 135 mmol/L (ref 135–145)

## 2024-05-18 LAB — GLUCOSE, CAPILLARY
Glucose-Capillary: 185 mg/dL — ABNORMAL HIGH (ref 70–99)
Glucose-Capillary: 271 mg/dL — ABNORMAL HIGH (ref 70–99)

## 2024-05-18 LAB — AMMONIA: Ammonia: 22 umol/L (ref 9–35)

## 2024-05-18 MED ORDER — METOPROLOL TARTRATE 25 MG PO TABS
25.0000 mg | ORAL_TABLET | Freq: Once | ORAL | Status: AC
  Administered 2024-05-18: 25 mg via ORAL
  Filled 2024-05-18: qty 1

## 2024-05-18 MED ORDER — INSULIN ASPART 100 UNIT/ML IJ SOLN: 2.0000 [IU] | Freq: Three times a day (TID) | INTRAMUSCULAR | Status: DC

## 2024-05-18 MED ORDER — METOPROLOL TARTRATE 25 MG PO TABS
25.0000 mg | ORAL_TABLET | Freq: Two times a day (BID) | ORAL | 0 refills | Status: DC
  Filled 2024-05-18: qty 60, 30d supply, fill #0

## 2024-05-18 MED ORDER — METOPROLOL TARTRATE 25 MG PO TABS
37.5000 mg | ORAL_TABLET | Freq: Two times a day (BID) | ORAL | 0 refills | Status: AC
  Filled 2024-05-18: qty 90, 30d supply, fill #0

## 2024-05-18 MED ORDER — METOPROLOL TARTRATE 5 MG/5ML IV SOLN
5.0000 mg | Freq: Once | INTRAVENOUS | Status: AC
  Administered 2024-05-18: 5 mg via INTRAVENOUS
  Filled 2024-05-18: qty 5

## 2024-05-18 MED ORDER — POLYETHYLENE GLYCOL 3350 17 GM/SCOOP PO POWD
17.0000 g | Freq: Every day | ORAL | 0 refills | Status: AC | PRN
  Filled 2024-05-18: qty 238, 14d supply, fill #0

## 2024-05-18 NOTE — Inpatient Diabetes Management (Signed)
 Inpatient Diabetes Program Recommendations  AACE/ADA: New Consensus Statement on Inpatient Glycemic Control  Target Ranges:  Prepandial:   less than 140 mg/dL      Peak postprandial:   less than 180 mg/dL (1-2 hours)      Critically ill patients:  140 - 180 mg/dL    Latest Reference Range & Units 05/18/24 05:51  Glucose 70 - 99 mg/dL 824 (H)    Latest Reference Range & Units 05/17/24 08:42 05/17/24 12:20 05/17/24 16:20 05/17/24 22:07  Glucose-Capillary 70 - 99 mg/dL 627 (H) 770 (H) 831 (H) 200 (H)    Latest Reference Range & Units 05/16/24 13:24 05/16/24 17:38 05/16/24 19:51 05/16/24 22:40  Glucose-Capillary 70 - 99 mg/dL 661 (H) 892 (H) 757 (H) 194 (H)   Review of Glycemic Control  Diabetes history: DM2 Outpatient Diabetes medications: Toujeo  20 units daily, Dexcom G7 CGM Current orders for Inpatient glycemic control: Lantus  20 units daily, Novolog  0-9 units TID with meals, Novolog  0-5 units QHS  Inpatient Diabetes Program Recommendations:    Insulin :  Please consider ordering Novolog  2 units TID with meals for meal coverage if patient eats at least 50% of meals.  Thanks, Earnie Gainer, RN, MSN, CDCES Diabetes Coordinator Inpatient Diabetes Program [phone] (Team Pager from 8am to 5pm)

## 2024-05-18 NOTE — Discharge Summary (Signed)
 " Physician Discharge Summary   Patient: Adriana Miller MRN: [id number] DOB: 1955-05-20  Admit date:     05/15/2024  Discharge date: 05/18/24  Discharge Physician: Charlie Patterson   PCP: Kristina Tinnie POUR, PA-C   Recommendations at discharge:   Follow-up PCP 5 days Follow-up cardiology  Discharge Diagnoses: Principal Problem:   Atrial fibrillation with RVR (HCC) Active Problems:   Acute kidney injury superimposed on CKD   Type 2 diabetes mellitus with other diabetic kidney complication (HCC)   Accidental fall   History of seizure   Hepatocellular carcinoma (HCC)   Depression   Acute on chronic diastolic CHF (congestive heart failure) (HCC)   Cirrhosis of liver (HCC)   Hyponatremia   Contusion of scalp   History of gout  Resolved Problems:   * No resolved hospital problems. *  Hospital Course: 69 y.o. 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  Patient stated that she was trying to reach down to pick up a pillow then she fell down injuring her back and head.  Patient complains of bump on her back of the head.  She denied any losing consciousness.  Patient complains of mild back pain otherwise no other symptoms.  She denied any fever chills nausea vomiting, chest pain, palpitations, fever, dysuria or hematuria.  However, she complains of shortness of breath on exertion.  She also complains some orthopnea but no PND.   ED Course: Upon arrival to the ED, patient is found to be hypoxic requiring oxygen.  Her CT head was negative for acute pathology, CT lumbar spine did not show any acute fractures but she had a severe central canal stenosis between L3-L4 and L4-L5.  CT C-spine showed no acute finding but she has  degenerative disc disease.  Chest x-ray did not show any acute pathology.  Her sodium was 131, creatinine was 1.79 slightly up from her baseline 1.2 to, BNP was 271.  There was concern for new hypoxemia, AKI and bilateral lower extremity swelling.  Hospitalist service was consulted for evaluation for admission for acute exacerbation of congestive heart failure.  1/29.  Case discussed with cardiology and will hold Lasix .  PT recommending home with home health.  Echocardiogram pending. 1/30.  Patient discharged home.  Prior to being discharged home heart rate went up to 128 and we gave an extra dose of metoprolol  and increased metoprolol  to 37.5 mg twice a day upon going home.  Heart rate improved upon discharge.  Assessment and Plan: * Atrial fibrillation with RVR (HCC) Have a heart rate of 145 on presentation.  Heart rate improved on metoprolol  25 mg twice daily.  Prior to disposition heart rate went up to 120 and needed to give an extra dose of metoprolol  we will increase metoprolol  to 37.5 mg twice a day.  Patient on Eliquis  for anticoagulation.  Acute kidney injury superimposed on CKD AKI on CKD stage IIIa.  Creatinine 1.79 on presentation.  Hold further diuresis.  Today's creatinine 1.48.  Hold Lasix  and potassium for another 2 days and then can restart  Type 2 diabetes mellitus with other diabetic kidney complication (HCC) Continue Lantus  insulin  and sliding scale while here and back on Toujeo  insulin  as outpatient  Accidental fall PT recommending home health  Hepatocellular carcinoma [facility] Outpatient follow-up  Depression On Lexapro   Acute on chronic diastolic CHF (congestive heart failure) (HCC) Ruled out with normal BNP.  Hold Lasix  today.  History of gout On allopurinol   Hyponatremia Last sodium normal range  Cirrhosis of liver (HCC) Continue spironolactone .  Hold Lasix  today.         Consultants: Cardiology Procedures performed: None Disposition: Home  health Diet recommendation:  Cardiac and Carb modified diet DISCHARGE MEDICATION: Allergies as of 05/18/2024       Reactions   Aspirin Other (See Comments)   Nose bleed        Medication List     STOP taking these medications    furosemide  20 MG tablet Commonly known as: LASIX    mirtazapine  7.5 MG tablet Commonly known as: REMERON    oxyCODONE -acetaminophen  5-325 MG tablet Commonly known as: Percocet   potassium chloride  8 MEQ tablet Commonly known as: KLOR-CON        TAKE these medications    Accu-Chek Guide test strip Generic drug: glucose blood Use as instructed twice a day DX E11.65   Accu-Chek Softclix Lancets lancets USE AS DIRECTED ONCE DAILY   acetaminophen  325 MG tablet Commonly known as: TYLENOL  Take 2 tablets (650 mg total) by mouth every 6 (six) hours as needed for mild pain (or Fever >/= 101).   allopurinol  100 MG tablet Commonly known as: ZYLOPRIM  TAKE 1 TABLET BY MOUTH DAILY   ascorbic acid  500 MG tablet Commonly known as: VITAMIN C  Take 1 tablet (500 mg total) by mouth daily.   B-D SINGLE USE SWABS REGULAR Pads USE AS DIRECTED TWICE DAILY   Dexcom G7 Sensor Misc USE AS DIRECTED FOR CONTINUOUS  GLUCOSE MONITORING . CHANGE  SENSOR EVERY 10 DAYS   Eliquis  5 MG Tabs tablet Generic drug: apixaban  TAKE ONE (1) TABLET BY MOUTH TWICE DAILY   Embecta Pen Needle Nano 32G X 4 MM Misc Generic drug: Insulin  Pen Needle USE AS DIRECTED WITH TOUJEO    escitalopram  10 MG tablet Commonly known as: LEXAPRO  TAKE 1 TABLET BY MOUTH DAILY FOR ANXIETY/SLEEP   ferrous sulfate  325 (65 FE) MG tablet Take 1 tablet (325 mg total) by mouth daily with breakfast.   folic acid  1 MG tablet Commonly known as: FOLVITE  Take 1 mg by mouth daily.   lidocaine  5 % Commonly known as: Lidoderm  Place 1 patch onto the skin every 12 (twelve) hours. Remove & Discard patch within 12 hours or as directed by MD   metoprolol  tartrate 25 MG tablet Commonly known as:  LOPRESSOR  Take 1.5 tablets (37.5 mg total) by mouth 2 (two) times daily. What changed: how much to take   multivitamin with minerals Tabs tablet Take 1 tablet by mouth daily.   polyethylene glycol powder 17 GM/SCOOP powder Commonly known as: GLYCOLAX /MIRALAX  Take 17 g by mouth daily as needed for moderate constipation. Dissolve 1 capful (17g) in 4-8 ounces of liquid and take by mouth daily.   spironolactone  25 MG tablet Commonly known as: ALDACTONE  TAKE 1 TABLET BY MOUTH DAILY FOR CIRRHOSIS OF LIVER   thiamine  100 MG tablet Commonly known as: Vitamin B-1 Take 1 tablet (100 mg total) by mouth daily.   Toujeo  SoloStar 300 UNIT/ML Solostar Pen Generic drug: insulin  glargine (1 Unit Dial ) Inject 20 Units into the skin daily.   traZODone  50 MG tablet Commonly known as: DESYREL  TAKE 1-2 TABLETS BY MOUTH AT BEDTIME FOR INSOMNIA        Contact information for follow-up providers     McDonough, Lauren K, PA-C. Schedule  an appointment as soon as possible for a visit in 5 day(s).   Specialty: Physician Assistant Contact information: [address] [place] [address] [phone]         McDonough, Tinnie POUR, PA-C Follow up in 1 week(s).   Specialty: Physician Assistant Why: 05-25-2024 11:00 Contact information: [address] [place] [address] [phone]         Gerard Frederick, NP Follow up in 2 week(s).   Specialty: Cardiology Contact information: [address] [place] [address] [phone]              Contact information for after-discharge care     Home Medical Care     [facility] [place] .   Service: Home Health Services Contact information: [address] [place]  [address] [phone]                    Discharge Exam: Filed Weights   05/16/24 1705 05/17/24 0532 05/18/24 0321  Weight: 76.7 kg 76.9 kg 76.9 kg   Physical Exam HENT:     Head: Normocephalic.  Eyes:     General:  Lids are normal.     Conjunctiva/sclera: Conjunctivae normal.  Cardiovascular:     Rate and Rhythm: Normal rate. Rhythm irregularly irregular.     Heart sounds: Normal heart sounds, S1 normal and S2 normal.  Pulmonary:     Breath sounds: Examination of the right-lower field reveals decreased breath sounds. Examination of the left-lower field reveals decreased breath sounds. Decreased breath sounds present. No wheezing, rhonchi or rales.  Abdominal:     Palpations: Abdomen is soft.     Tenderness: There is no abdominal tenderness.  Musculoskeletal:     Right lower leg: Swelling present.     Left lower leg: Swelling present.  Skin:    General: Skin is warm.     Findings: No rash.  Neurological:     Mental Status: She is alert.      Condition at discharge: stable  The results of significant diagnostics from this hospitalization (including imaging, microbiology, ancillary and laboratory) are listed below for reference.   Imaging Studies: ECHOCARDIOGRAM COMPLETE Result Date: 05/17/2024    ECHOCARDIOGRAM REPORT   Patient Name:   KASHIRA BEHUNIN Date of Exam: 05/17/2024 Medical Rec #:  [id number]        Height:       66.0 in Accession #:    [id number]       Weight:       169.5 lb Date of Birth:  1956-03-18        BSA:          1.864 m Patient Age:    68 years         BP:           96/61 mmHg Patient Gender: F                HR:           79 bpm. Exam Location:  [facility] Procedure: 2D Echo, Cardiac Doppler and Color Doppler (Both Spectral and Color            Flow Doppler were utilized during procedure). Indications:     CHF--acute diastolic I50.31  History:         Patient has prior history of Echocardiogram examinations, most                  recent 08/22/2022. Risk  Factors:Hypertension and Dyslipidemia.  Sonographer:     Christopher Furnace Referring Phys:  [id number] EVALENE JINNY LUNGER Diagnosing Phys: Evalene Lunger MD  Sonographer Comments: Technically challenging study due to limited acoustic windows. IMPRESSIONS   1. Left ventricular ejection fraction, by estimation, is 60 to 65%. The left ventricle has normal function. The left ventricle has no regional wall motion abnormalities. There is mild left ventricular hypertrophy. Left ventricular diastolic parameters are indeterminate.  2. Right ventricular systolic function is normal. The right ventricular size is normal.  3. The mitral valve is normal in structure. No evidence of mitral valve regurgitation. No evidence of mitral stenosis. Moderate mitral annular calcification.  4. The aortic valve is normal in structure. Aortic valve regurgitation is not visualized. No aortic stenosis is present.  5. The inferior vena cava is normal in size with greater than 50% respiratory variability, suggesting right atrial pressure of 3 mmHg. FINDINGS  Left Ventricle: Left ventricular ejection fraction, by estimation, is 60 to 65%. The left ventricle has normal function. The left ventricle has no regional wall motion abnormalities. Strain was performed and the global longitudinal strain is indeterminate. The left ventricular internal cavity size was normal in size. There is mild left ventricular hypertrophy. Left ventricular diastolic parameters are indeterminate. Right Ventricle: The right ventricular size is normal. No increase in right ventricular wall thickness. Right ventricular systolic function is normal. Left Atrium: Left atrial size was normal in size. Right Atrium: Right atrial size was normal in size. Pericardium: There is no evidence of pericardial effusion. Mitral Valve: The mitral valve is normal in structure. Moderate mitral annular calcification. No evidence of mitral valve regurgitation. No evidence of mitral valve stenosis. MV peak gradient, 5.9 mmHg. The mean mitral valve gradient is 2.0 mmHg. Tricuspid Valve: The tricuspid valve is normal in structure. Tricuspid valve regurgitation is not demonstrated. No evidence of tricuspid stenosis. Aortic Valve: The aortic valve is  normal in structure. Aortic valve regurgitation is not visualized. No aortic stenosis is present. Aortic valve mean gradient measures 1.0 mmHg. Aortic valve peak gradient measures 2.1 mmHg. Aortic valve area, by VTI measures 2.85 cm. Pulmonic Valve: The pulmonic valve was normal in structure. Pulmonic valve regurgitation is not visualized. No evidence of pulmonic stenosis. Aorta: The aortic root is normal in size and structure. Venous: The inferior vena cava is normal in size with greater than 50% respiratory variability, suggesting right atrial pressure of 3 mmHg. IAS/Shunts: No atrial level shunt detected by color flow Doppler. Additional Comments: 3D was performed not requiring image post processing on an independent workstation and was indeterminate.  LEFT VENTRICLE PLAX 2D LVIDd:         1.95 cm LVIDs:         1.34 cm LV PW:         0.84 cm LV IVS:        1.33 cm LVOT diam:     2.00 cm LV SV:         37 LV SV Index:   20 LVOT Area:     3.14 cm  RIGHT VENTRICLE RV Basal diam:  2.38 cm RV Mid diam:    2.15 cm LEFT ATRIUM           Index        RIGHT ATRIUM           Index LA diam:      3.50 cm 1.88 cm/m   RA Area:     11.50 cm LA Vol (  A2C): 20.3 ml 10.89 ml/m  RA Volume:   21.20 ml  11.37 ml/m LA Vol (A4C): 36.5 ml 19.58 ml/m  AORTIC VALVE AV Area (Vmax):    2.72 cm AV Area (Vmean):   2.77 cm AV Area (VTI):     2.85 cm AV Vmax:           71.70 cm/s AV Vmean:          48.700 cm/s AV VTI:            0.129 m AV Peak Grad:      2.1 mmHg AV Mean Grad:      1.0 mmHg LVOT Vmax:         62.10 cm/s LVOT Vmean:        43.000 cm/s LVOT VTI:          0.117 m LVOT/AV VTI ratio: 0.91  AORTA Ao Root diam: 2.40 cm MITRAL VALVE MV Area (PHT): 4.74 cm    SHUNTS MV Area VTI:   1.83 cm    Systemic VTI:  0.12 m MV Peak grad:  5.9 mmHg    Systemic Diam: 2.00 cm MV Mean grad:  2.0 mmHg MV Vmax:       1.21 m/s MV Vmean:      61.2 cm/s MV Decel Time: 160 msec MV E velocity: 93.60 cm/s Evalene Lunger MD Electronically signed  by Evalene Lunger MD Signature Date/Time: 05/17/2024/5:17:59 PM    Final    DG Chest 2 View Result Date: 05/16/2024 EXAM: 2 VIEW(S) XRAY OF THE CHEST 05/16/2024 03:36:42 AM COMPARISON: 09/26/2023 CLINICAL HISTORY: Slight hypoxia after intravenous fluids, rales in lungs. Query congestive heart failure or edema. FINDINGS: LUNGS AND PLEURA: Low lung volumes. No focal pulmonary opacity. No pleural effusion. No pneumothorax. HEART AND MEDIASTINUM: Atherosclerotic calcification of the aortic arch. No acute abnormality of the cardiac and mediastinal silhouettes. BONES AND SOFT TISSUES: Chronic left proximal humerus deformity. Thoracic spondylosis. IMPRESSION: 1. No radiographic evidence of acute cardiopulmonary disease. Electronically signed by: Dorethia Molt MD 05/16/2024 03:53 AM EST RP Workstation: [id number]   CT Cervical Spine Wo Contrast Result Date: 05/15/2024 EXAM: CT CERVICAL SPINE WITHOUT CONTRAST 05/15/2024 10:28:07 PM TECHNIQUE: CT of the cervical spine was performed without the administration of intravenous contrast. Multiplanar reformatted images are provided for review. Automated exposure control, iterative reconstruction, and/or weight based adjustment of the mA/kV was utilized to reduce the radiation dose to as low as reasonably achievable. COMPARISON: None available. CLINICAL HISTORY: trauma Trauma. FINDINGS: BONES AND ALIGNMENT: Ankylosis of the vertebral bodies of C5-C7. No acute fracture or traumatic malalignment. DEGENERATIVE CHANGES: Prominent anterior disc osteophyte seen at C4-C5 resulting in mild compression of the posterior hypopharynx. Advanced degenerative disc disease C3-C4 with small posterior disc osteophyte complex resulting in moderate central canal stenosis with abutment and flattening of the thecal sac and AP diameter of the spinal canal of 6-7 mm. Similar, though milder changes are seen at C4-C5 and C5-C6 with an AP diameter of the spinal canal of 7 mm. Spinal canal is otherwise  widely patent. Uncovertebral arthrosis results in moderate right and severe left neural foraminal narrowing at C3-C4. Remaining neuroforamina are widely patent. SOFT TISSUES: No prevertebral soft tissue swelling. IMPRESSION: 1. No acute findings. 2. Advanced degenerative disc disease at C3-4 with moderate central canal stenosis (AP diameter 6-7 mm) and moderate right/severe left neural foraminal narrowing. 3. Prominent anterior disc osteophyte at C4-5 resulting in mild compression of the posterior hypopharynx. 4. Ankylosis of the vertebral bodies of  C5-C7. Electronically signed by: Dorethia Molt MD 05/15/2024 10:47 PM EST RP Workstation: [id number]   CT Lumbar Spine Wo Contrast Result Date: 05/15/2024 EXAM: CT OF THE LUMBAR SPINE WITHOUT CONTRAST 05/15/2024 10:28:07 PM TECHNIQUE: CT of the lumbar spine was performed without the administration of intravenous contrast. Multiplanar reformatted images are provided for review. Automated exposure control, iterative reconstruction, and/or weight based adjustment of the mA/kV was utilized to reduce the radiation dose to as low as reasonably achievable. COMPARISON: None available. CLINICAL HISTORY: trauma Trauma. FINDINGS: BONES AND ALIGNMENT: Normal lumbar lordosis. No acute fracture or listhesis of the lumbar spine. Vertebral body height is preserved. Normal alignment. DEGENERATIVE CHANGES: Disc space narrowing is seen throughout the visualized thoracolumbar spine in keeping with changes of mild-to-moderate degenerative disc disease, most severe at T10-T12. Bridging anterior disc osteophytes are seen at T10-T11 and L4-S1. There is degenerative ankylosis of the facet joints bilaterally at T11-L3. Advanced facet arthrosis bilaterally at L3-L4 with more moderate degenerative changes noted at L4-L5 and L5-S1. L3-L4: Broad-based disc bulge in combination with hypertrophic facet arthrosis results in moderate bilateral neural foraminal narrowing and severe central canal  stenosis. L4-L5: Similar changes at L4-L5 result in mild left neural foraminal narrowing and severe central canal stenosis. SOFT TISSUES: No paraspinal fluid collection or inflammatory change. INCIDENTAL ABDOMINAL FINDINGS: Cholelithiasis. 5 mm punctate cortical calcification within the right kidney, nonspecific. Advanced arteriosclerosis. IMPRESSION: 1. No acute fracture or listhesis of the lumbar spine. 2. Severe central canal stenosis at L3-4 and L4-5, with moderate bilateral neural foraminal narrowing at L3-4 and mild left neural foraminal narrowing at L4-5. 3. Chronic thoracolumbar degenerative disc disease with bridging anterior osteophytes, degenerative facet ankylosis at T11-L3, and advanced facet arthrosis at L3-4. 4. Cholelithiasis 5. Peripheral vascular disease Electronically signed by: Dorethia Molt MD 05/15/2024 10:41 PM EST RP Workstation: [id number]   CT HEAD WO CONTRAST ( ) Result Date: 05/15/2024 EXAM: CT HEAD WITHOUT CONTRAST 05/15/2024 10:28:07 PM TECHNIQUE: CT of the head was performed without the administration of intravenous contrast. Automated exposure control, iterative reconstruction, and/or weight based adjustment of the mA/kV was utilized to reduce the radiation dose to as low as reasonably achievable. COMPARISON: 03/09/2024 CLINICAL HISTORY: Trauma. FINDINGS: BRAIN AND VENTRICLES: No acute hemorrhage. No evidence of acute infarct. Global parenchymal volume loss, commensurate with the patient's age. Periventricular white matter decreased attenuation consistent with small vessel ischemic changes. 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 No hydrocephalus. No extra-axial collection. No mass effect or midline shift. ORBITS: Bilateral lens replacement. Chronic right medial orbital wall fracture. SINUSES: No acute abnormality. SOFT TISSUES AND SKULL: Right frontal parietal craniotomy noted. No acute soft tissue abnormality. No skull fracture.  VASCULATURE: Moderate calcific atheromatous disease within the carotid siphons. IMPRESSION: 1. No acute intracranial abnormality. 2. Chronic right pontine, right thalamic, and left basal ganglia lacunar infarcts. 3. Global parenchymal volume loss. Periventricular white matter hypoattenuation consistent with chronic small vessel ischemic change. 4. Right frontoparietal craniotomy. Electronically signed by: Dorethia Molt MD 05/15/2024 10:34 PM EST RP Workstation: [id number]    Microbiology: Results for orders placed or performed during the hospital encounter of 05/15/24  Resp panel by RT-PCR (RSV, Flu A&B, Covid) Anterior Nasal Swab     Status: None   Collection Time: 05/16/24  7:08 AM   Specimen: Anterior Nasal Swab  Result Value Ref Range Status   SARS Coronavirus 2 by RT PCR NEGATIVE NEGATIVE Final    Comment: (NOTE) SARS-CoV-2 target nucleic acids are NOT DETECTED.  The  SARS-CoV-2 RNA is generally detectable in upper respiratory specimens during the acute phase of infection. The lowest concentration of SARS-CoV-2 viral copies this assay can detect is 138 copies/mL. A negative result does not preclude SARS-Cov-2 infection and should not be used as the sole basis for treatment or other patient management decisions. A negative result may occur with  improper specimen collection/handling, submission of specimen other than nasopharyngeal swab, presence of viral mutation(s) within the areas targeted by this assay, and inadequate number of viral copies(<138 copies/mL). A negative result must be combined with clinical observations, patient history, and epidemiological information. The expected result is Negative.  Fact Sheet for Patients:  bloggercourse.com  Fact Sheet for Healthcare Providers:  seriousbroker.it  This test is no t yet approved or cleared by the [place]  FDA and  has been authorized for detection and/or diagnosis of  SARS-CoV-2 by FDA under an Emergency Use Authorization (EUA). This EUA will remain  in effect (meaning this test can be used) for the duration of the COVID-19 declaration under Section 564(b)(1) of the Act, 21 U.S.C.section 360bbb-3(b)(1), unless the authorization is terminated  or revoked sooner.       Influenza A by PCR NEGATIVE NEGATIVE Final   Influenza B by PCR NEGATIVE NEGATIVE Final    Comment: (NOTE) The Xpert Xpress SARS-CoV-2/FLU/RSV plus assay is intended as an aid in the diagnosis of influenza from Nasopharyngeal swab specimens and should not be used as a sole basis for treatment. Nasal washings and aspirates are unacceptable for Xpert Xpress SARS-CoV-2/FLU/RSV testing.  Fact Sheet for Patients: bloggercourse.com  Fact Sheet for Healthcare Providers: seriousbroker.it  This test is not yet approved or cleared by the [place]  FDA and has been authorized for detection and/or diagnosis of SARS-CoV-2 by FDA under an Emergency Use Authorization (EUA). This EUA will remain in effect (meaning this test can be used) for the duration of the COVID-19 declaration under Section 564(b)(1) of the Act, 21 U.S.C. section 360bbb-3(b)(1), unless the authorization is terminated or revoked.     Resp Syncytial Virus by PCR NEGATIVE NEGATIVE Final    Comment: (NOTE) Fact Sheet for Patients: bloggercourse.com  Fact Sheet for Healthcare Providers: seriousbroker.it  This test is not yet approved or cleared by the [place]  FDA and has been authorized for detection and/or diagnosis of SARS-CoV-2 by FDA under an Emergency Use Authorization (EUA). This EUA will remain in effect (meaning this test can be used) for the duration of the COVID-19 declaration under Section 564(b)(1) of the Act, 21 U.S.C. section 360bbb-3(b)(1), unless the authorization is terminated  or revoked.  Performed at [facility], [address] [place], [place] [address]     Labs: CBC: Recent Labs  Lab 05/15/24 2213 05/17/24 0548  WBC 6.6 5.7  NEUTROABS 4.8  --   HGB 13.5 11.4*  HCT 42.3 35.5*  MCV 97.9 97.3  PLT 142* 121*   Basic Metabolic Panel: Recent Labs  Lab 05/15/24 2213 05/16/24 1016 05/17/24 0548 05/18/24 0551  NA 131* 139 136 135  K 4.3 4.2 4.1 3.7  CL 95* 101 102 101  CO2 21* 24 25 28   GLUCOSE 178* 155* 153* 175*  BUN 30* 28* 36* 36*  CREATININE 1.79* 1.53* 1.65* 1.48*  CALCIUM  9.1 8.8* 8.3* 8.5*  MG  --   --  1.8  --    Liver Function Tests: No results for input(s): AST, ALT, ALKPHOS, BILITOT, PROT, ALBUMIN  in the last 168 hours. CBG: Recent Labs  Lab 05/17/24 1220  05/17/24 1620 05/17/24 2207 05/18/24 0859 05/18/24 1148  GLUCAP 229* 168* 200* 185* 271*    Discharge time spent: greater than 30 minutes.  Signed: Charlie Patterson, MD [facility] 05/18/2024 "

## 2024-05-18 NOTE — Plan of Care (Signed)

## 2024-05-18 NOTE — Progress Notes (Signed)
 Heart Failure Navigator Progress Note  Assessed for Heart & Vascular TOC clinic readiness.  Patient does not meet criteria due to CHF ruled out with normal BNP per hospitalist note on 05/17/24.  Echo-05/18/24 60-65%.  Navigator will sign off at this time.  Charmaine Pines, RN, BSN [facility] Heart Failure Navigator Secure Chat Only

## 2024-05-18 NOTE — TOC Transition Note (Signed)
 Transition of Care [facility]) - Discharge Note   Patient Details  Name: Adriana Miller MRN: [id number] Date of Birth: Sep 21, 1955  Transition of Care [facility]) CM/SW Contact:  [name], RN Phone Number: 05/18/2024, 1:56 PM   Clinical Narrative:       Final next level of care: Home w Home Health Services Barriers to Discharge: No Barriers Identified   Patient Goals and CMS Choice Patient states their goals for this hospitalization and ongoing recovery are:: to return home          Discharge Placement                    Patient and family notified of of transfer: 05/18/24 (Brother)  Discharge Plan and Services Additional resources added to the After Visit Summary for     Discharge Planning Services: CM Consult                      HH Arranged: PT, OT HH Agency: [facility] Date HH Agency Contacted: 05/18/24   Representative spoke with at [facility] Agency: Kreg  Social Drivers of Health (SDOH) Interventions SDOH Screenings   Food Insecurity: No Food Insecurity (05/16/2024)  Housing: Low Risk (05/16/2024)  Transportation Needs: No Transportation Needs (05/16/2024)  Utilities: Not At Risk (05/16/2024)  Alcohol  Screen: Low Risk (06/06/2023)  Depression (PHQ2-9): Low Risk (03/06/2024)  Social Connections: Socially Isolated (05/16/2024)  Tobacco Use: High Risk (05/15/2024)     Readmission Risk Interventions    11/20/2022    2:04 PM 10/01/2022    9:15 AM  Readmission Risk Prevention Plan  Transportation Screening Complete Complete  Medication Review (RN Care Manager) Complete Complete  PCP or Specialist appointment within 3-5 days of discharge Complete Complete  HRI or Home Care Consult Complete Complete  SW Recovery Care/Counseling Consult Complete Complete  Palliative Care Screening Not Applicable Not Applicable  Skilled Nursing Facility Not Applicable Not Applicable

## 2024-05-18 NOTE — Progress Notes (Signed)
 Adriana Miller                                          MRN: [id number]   05/18/2024   The VBCI Quality Team Specialist reviewed this patient medical record for the purposes of chart review for care gap closure. The following were reviewed: abstraction for care gap closure-glycemic status assessment.    VBCI Quality Team

## 2024-05-18 NOTE — TOC Transition Note (Signed)
 Transition of Care [facility]) - Discharge Note   Patient Details  Name: Adriana Miller MRN: [id number] Date of Birth: December 10, 1955  Transition of Care [facility]) CM/SW Contact:  [name], RN Phone Number: 05/18/2024, 1:50 PM   Clinical Narrative:   RNCM met with patient in her room.  She is being discharged today.  Notified her that [facility] PT/OT set up with [facility], confirmed acceptance with Hillary. Attempted to contact PCA , no answer and mailbox full. Patient has number in her phone and will continue to attempt contact.  Patient's brother on phone and plans to pick her up for transport home.  No further TOC needs    Final next level of care: Home w Home Health Services Barriers to Discharge: Continued Medical Work up   Patient Goals and CMS Choice Patient states their goals for this hospitalization and ongoing recovery are:: to return home          Discharge Placement                       Discharge Plan and Services Additional resources added to the After Visit Summary for     Discharge Planning Services: CM Consult                      HH Arranged: PT, OT HH Agency:  (referrals sent)        Social Drivers of Health (SDOH) Interventions SDOH Screenings   Food Insecurity: No Food Insecurity (05/16/2024)  Housing: Low Risk (05/16/2024)  Transportation Needs: No Transportation Needs (05/16/2024)  Utilities: Not At Risk (05/16/2024)  Alcohol  Screen: Low Risk (06/06/2023)  Depression (PHQ2-9): Low Risk (03/06/2024)  Social Connections: Socially Isolated (05/16/2024)  Tobacco Use: High Risk (05/15/2024)     Readmission Risk Interventions    11/20/2022    2:04 PM 10/01/2022    9:15 AM  Readmission Risk Prevention Plan  Transportation Screening Complete Complete  Medication Review Oceanographer) Complete Complete  PCP or Specialist appointment within 3-5 days of discharge Complete Complete  HRI or Home Care Consult Complete Complete  SW Recovery  Care/Counseling Consult Complete Complete  Palliative Care Screening Not Applicable Not Applicable  Skilled Nursing Facility Not Applicable Not Applicable

## 2024-05-22 ENCOUNTER — Telehealth: Payer: Self-pay

## 2024-05-22 ENCOUNTER — Telehealth: Payer: Self-pay | Admitting: Internal Medicine

## 2024-05-22 NOTE — Telephone Encounter (Signed)
 Medford from [facility] calling to report a resting HR of 110-117 (yesterday)  Medford states has not checked as on 10:24 due to her still resting.

## 2024-05-22 NOTE — Telephone Encounter (Signed)
 Called Medford - he states patient has just gotten out of the hospital and he filled her pill box during the visit. Metoprolol  was increased to 37.5 mg BID, she had not taken her meds yesterday when he was with her   Called patient - left message to return call to the office

## 2024-05-22 NOTE — Telephone Encounter (Signed)
 Gave verbal order to [facility] once a week 5 week and once a week for every other week for 4 weeks [phone] and also add nursing for medication management and also pulse 110 for resting advised to contact cardiology and also pt had hospital appt this week

## 2024-05-23 ENCOUNTER — Telehealth: Payer: Self-pay

## 2024-05-23 ENCOUNTER — Other Ambulatory Visit: Payer: Self-pay | Admitting: Physician Assistant

## 2024-05-23 DIAGNOSIS — G2581 Restless legs syndrome: Secondary | ICD-10-CM

## 2024-05-23 NOTE — Telephone Encounter (Signed)
 Called Medford to see if he had been back out to see Ms. Pardy - he stated the nurse was going today - asked if she would call me regarding medications and pt heart rate

## 2024-05-23 NOTE — Telephone Encounter (Unsigned)
 Adriana Miller - [facility] returned call to the office - stated patient heart rate today was 53  - patient recently in hospital and Metoprolol  increased to 37.5 mg daily - nurse has set up pill box for patient

## 2024-05-23 NOTE — Telephone Encounter (Signed)
 Gave verbal order to [facility] [phone] for nursing once a week for 5 weeks

## 2024-05-24 ENCOUNTER — Other Ambulatory Visit: Payer: Self-pay

## 2024-05-24 MED ORDER — ACCU-CHEK SOFTCLIX LANCETS MISC: 2 refills | Status: AC

## 2024-05-24 NOTE — Telephone Encounter (Signed)
 Recommend continuing metoprolol  to tartrate 37.5 mg twice daily and having her follow-up with Suzann Riddle, NP, for ongoing management of her atrial fibrillation.  Lonni Hanson, MD [facility]

## 2024-05-25 ENCOUNTER — Inpatient Hospital Stay: Admitting: Physician Assistant

## 2024-06-07 ENCOUNTER — Ambulatory Visit: Payer: 59 | Admitting: Physician Assistant
